# Patient Record
Sex: Female | Born: 1950 | Race: Black or African American | Hispanic: No | Smoking: Current every day smoker
Health system: Southern US, Community
[De-identification: ages and names within clinical notes are randomized; demographics above are authoritative.]

## PROBLEM LIST (undated history)

## (undated) DIAGNOSIS — H81319 Aural vertigo, unspecified ear: Secondary | ICD-10-CM

## (undated) DIAGNOSIS — I2510 Atherosclerotic heart disease of native coronary artery without angina pectoris: Secondary | ICD-10-CM

## (undated) DIAGNOSIS — G8929 Other chronic pain: Secondary | ICD-10-CM

## (undated) DIAGNOSIS — M545 Low back pain, unspecified: Secondary | ICD-10-CM

## (undated) DIAGNOSIS — E669 Obesity, unspecified: Secondary | ICD-10-CM

## (undated) DIAGNOSIS — J45909 Unspecified asthma, uncomplicated: Secondary | ICD-10-CM

## (undated) DIAGNOSIS — F32A Depression, unspecified: Secondary | ICD-10-CM

## (undated) DIAGNOSIS — I639 Cerebral infarction, unspecified: Secondary | ICD-10-CM

## (undated) DIAGNOSIS — Z8679 Personal history of other diseases of the circulatory system: Secondary | ICD-10-CM

## (undated) DIAGNOSIS — Z86718 Personal history of other venous thrombosis and embolism: Secondary | ICD-10-CM

## (undated) DIAGNOSIS — F329 Major depressive disorder, single episode, unspecified: Secondary | ICD-10-CM

## (undated) DIAGNOSIS — E119 Type 2 diabetes mellitus without complications: Secondary | ICD-10-CM

## (undated) DIAGNOSIS — E7800 Pure hypercholesterolemia, unspecified: Secondary | ICD-10-CM

## (undated) DIAGNOSIS — I219 Acute myocardial infarction, unspecified: Secondary | ICD-10-CM

## (undated) DIAGNOSIS — I10 Essential (primary) hypertension: Secondary | ICD-10-CM

## (undated) DIAGNOSIS — N289 Disorder of kidney and ureter, unspecified: Secondary | ICD-10-CM

## (undated) DIAGNOSIS — M75102 Unspecified rotator cuff tear or rupture of left shoulder, not specified as traumatic: Secondary | ICD-10-CM

## (undated) DIAGNOSIS — J449 Chronic obstructive pulmonary disease, unspecified: Secondary | ICD-10-CM

## (undated) HISTORY — DX: Type 2 diabetes mellitus without complications: E11.9

## (undated) HISTORY — DX: Depression, unspecified: F32.A

## (undated) HISTORY — DX: Personal history of other diseases of the circulatory system: Z86.79

## (undated) HISTORY — DX: Obesity, unspecified: E66.9

## (undated) HISTORY — PX: JOINT REPLACEMENT: SHX530

## (undated) HISTORY — DX: Atherosclerotic heart disease of native coronary artery without angina pectoris: I25.10

## (undated) HISTORY — DX: Essential (primary) hypertension: I10

## (undated) HISTORY — DX: Major depressive disorder, single episode, unspecified: F32.9

## (undated) HISTORY — DX: Cerebral infarction, unspecified: I63.9

## (undated) HISTORY — PX: CARDIAC CATHETERIZATION: SHX172

## (undated) HISTORY — PX: CORONARY STENT PLACEMENT: SHX1402

## (undated) HISTORY — DX: Acute myocardial infarction, unspecified: I21.9

## (undated) HISTORY — DX: Pure hypercholesterolemia, unspecified: E78.00

## (undated) HISTORY — DX: Chronic obstructive pulmonary disease, unspecified: J44.9

## (undated) HISTORY — PX: REPLACEMENT TOTAL KNEE BILATERAL: SUR1225

## (undated) HISTORY — PX: KNEE SURGERY: SHX244

## (undated) HISTORY — DX: Aural vertigo, unspecified ear: H81.319

## (undated) HISTORY — DX: Personal history of other venous thrombosis and embolism: Z86.718

---

## 2004-07-27 ENCOUNTER — Emergency Department: Payer: Self-pay | Admitting: Emergency Medicine

## 2005-03-21 ENCOUNTER — Ambulatory Visit: Payer: Self-pay

## 2005-08-23 ENCOUNTER — Other Ambulatory Visit: Payer: Self-pay

## 2005-09-01 ENCOUNTER — Inpatient Hospital Stay: Payer: Self-pay | Admitting: Orthopaedic Surgery

## 2005-09-19 ENCOUNTER — Emergency Department: Payer: Self-pay | Admitting: Emergency Medicine

## 2006-03-13 ENCOUNTER — Emergency Department: Payer: Self-pay | Admitting: Emergency Medicine

## 2006-05-17 ENCOUNTER — Ambulatory Visit: Payer: Self-pay | Admitting: Unknown Physician Specialty

## 2006-11-04 ENCOUNTER — Emergency Department: Payer: Self-pay | Admitting: General Practice

## 2007-01-22 ENCOUNTER — Ambulatory Visit: Payer: Self-pay | Admitting: Internal Medicine

## 2007-03-19 ENCOUNTER — Emergency Department: Payer: Self-pay | Admitting: Emergency Medicine

## 2007-05-16 ENCOUNTER — Emergency Department: Payer: Self-pay | Admitting: Emergency Medicine

## 2007-05-20 ENCOUNTER — Emergency Department: Payer: Self-pay | Admitting: Emergency Medicine

## 2008-02-07 ENCOUNTER — Emergency Department: Payer: Self-pay | Admitting: Emergency Medicine

## 2008-05-06 ENCOUNTER — Other Ambulatory Visit: Payer: Self-pay

## 2008-05-06 ENCOUNTER — Emergency Department: Payer: Self-pay | Admitting: Emergency Medicine

## 2008-06-22 ENCOUNTER — Emergency Department: Payer: Self-pay | Admitting: Emergency Medicine

## 2008-08-19 ENCOUNTER — Emergency Department: Payer: Self-pay | Admitting: Emergency Medicine

## 2009-03-07 ENCOUNTER — Emergency Department: Payer: Self-pay | Admitting: Emergency Medicine

## 2009-03-22 ENCOUNTER — Emergency Department: Payer: Self-pay | Admitting: Emergency Medicine

## 2009-08-10 ENCOUNTER — Emergency Department: Payer: Self-pay | Admitting: Unknown Physician Specialty

## 2010-05-19 ENCOUNTER — Ambulatory Visit: Payer: Self-pay | Admitting: Unknown Physician Specialty

## 2010-09-11 ENCOUNTER — Ambulatory Visit: Payer: Self-pay | Admitting: Unknown Physician Specialty

## 2010-09-13 ENCOUNTER — Inpatient Hospital Stay: Payer: Self-pay | Admitting: Internal Medicine

## 2010-09-19 HISTORY — PX: CORONARY ARTERY BYPASS GRAFT: SHX141

## 2010-10-26 ENCOUNTER — Ambulatory Visit: Payer: Self-pay | Admitting: Unknown Physician Specialty

## 2011-02-07 ENCOUNTER — Emergency Department: Payer: Self-pay | Admitting: Emergency Medicine

## 2011-03-26 DIAGNOSIS — E119 Type 2 diabetes mellitus without complications: Secondary | ICD-10-CM | POA: Insufficient documentation

## 2011-03-26 DIAGNOSIS — I2510 Atherosclerotic heart disease of native coronary artery without angina pectoris: Secondary | ICD-10-CM | POA: Insufficient documentation

## 2011-03-26 DIAGNOSIS — N185 Chronic kidney disease, stage 5: Secondary | ICD-10-CM | POA: Insufficient documentation

## 2011-03-26 DIAGNOSIS — E669 Obesity, unspecified: Secondary | ICD-10-CM | POA: Insufficient documentation

## 2011-05-05 DIAGNOSIS — F329 Major depressive disorder, single episode, unspecified: Secondary | ICD-10-CM | POA: Insufficient documentation

## 2011-05-05 DIAGNOSIS — F32A Depression, unspecified: Secondary | ICD-10-CM | POA: Insufficient documentation

## 2011-06-14 DIAGNOSIS — I739 Peripheral vascular disease, unspecified: Secondary | ICD-10-CM | POA: Insufficient documentation

## 2011-06-14 DIAGNOSIS — E782 Mixed hyperlipidemia: Secondary | ICD-10-CM | POA: Insufficient documentation

## 2011-06-14 DIAGNOSIS — Z9989 Dependence on other enabling machines and devices: Secondary | ICD-10-CM | POA: Insufficient documentation

## 2011-06-14 DIAGNOSIS — G4733 Obstructive sleep apnea (adult) (pediatric): Secondary | ICD-10-CM | POA: Insufficient documentation

## 2011-08-12 ENCOUNTER — Inpatient Hospital Stay: Payer: Self-pay | Admitting: Internal Medicine

## 2011-09-26 ENCOUNTER — Encounter: Payer: Self-pay | Admitting: Internal Medicine

## 2011-10-21 ENCOUNTER — Encounter: Payer: Self-pay | Admitting: Internal Medicine

## 2011-11-10 ENCOUNTER — Ambulatory Visit: Payer: Self-pay | Admitting: Unknown Physician Specialty

## 2011-11-18 ENCOUNTER — Encounter: Payer: Self-pay | Admitting: Internal Medicine

## 2012-01-31 ENCOUNTER — Emergency Department: Payer: Self-pay | Admitting: Emergency Medicine

## 2012-01-31 LAB — URINALYSIS, COMPLETE
Specific Gravity: 1.01 (ref 1.003–1.030)
Squamous Epithelial: <1
WBC UR: 14 /HPF (ref 0–5)

## 2012-01-31 LAB — COMPREHENSIVE METABOLIC PANEL
Anion Gap: 11 (ref 7–16)
BUN: 40 mg/dL — ABNORMAL HIGH (ref 7–18)
Calcium, Total: 8.8 mg/dL (ref 8.5–10.1)
Chloride: 111 mmol/L — ABNORMAL HIGH (ref 98–107)
Co2: 19 mmol/L — ABNORMAL LOW (ref 21–32)
EGFR (African American): 23 — ABNORMAL LOW
EGFR (Non-African Amer.): 20 — ABNORMAL LOW
SGOT(AST): 15 U/L (ref 15–37)
Sodium: 141 mmol/L (ref 136–145)
Total Protein: 7.8 g/dL (ref 6.4–8.2)

## 2012-01-31 LAB — CBC
HCT: 33.9 % — ABNORMAL LOW (ref 35.0–47.0)
MCH: 27.7 pg (ref 26.0–34.0)
MCHC: 32.1 g/dL (ref 32.0–36.0)
RBC: 3.93 10*6/uL (ref 3.80–5.20)
RDW: 15.8 % — ABNORMAL HIGH (ref 11.5–14.5)
WBC: 8.7 10*3/uL (ref 3.6–11.0)

## 2012-01-31 LAB — TROPONIN I: Troponin-I: <0.02 ng/mL

## 2012-04-18 ENCOUNTER — Ambulatory Visit: Payer: Self-pay | Admitting: Internal Medicine

## 2012-05-14 ENCOUNTER — Emergency Department: Payer: Self-pay | Admitting: *Deleted

## 2012-05-15 LAB — BASIC METABOLIC PANEL
BUN: 45 mg/dL — ABNORMAL HIGH (ref 7–18)
Chloride: 109 mmol/L — ABNORMAL HIGH (ref 98–107)
Co2: 23 mmol/L (ref 21–32)
Creatinine: 2.51 mg/dL — ABNORMAL HIGH (ref 0.60–1.30)
EGFR (Non-African Amer.): 20 — ABNORMAL LOW
Sodium: 139 mmol/L (ref 136–145)

## 2012-05-15 LAB — URINALYSIS, COMPLETE
Bacteria: NONE SEEN
Bilirubin,UR: NEGATIVE
Nitrite: NEGATIVE
Protein: 100
RBC,UR: 1 /HPF (ref 0–5)
Specific Gravity: 1.013 (ref 1.003–1.030)
Squamous Epithelial: <1
WBC UR: 12 /HPF (ref 0–5)

## 2012-05-15 LAB — CBC
HCT: 37.4 % (ref 35.0–47.0)
HGB: 12 g/dL (ref 12.0–16.0)
MCH: 27.6 pg (ref 26.0–34.0)
MCV: 86 fL (ref 80–100)
Platelet: 223 10*3/uL (ref 150–440)
RBC: 4.36 10*6/uL (ref 3.80–5.20)

## 2012-05-15 LAB — CK TOTAL AND CKMB (NOT AT ARMC): CK, Total: 131 U/L (ref 21–215)

## 2012-05-30 ENCOUNTER — Ambulatory Visit: Payer: Self-pay | Admitting: Gastroenterology

## 2012-06-01 LAB — PATHOLOGY REPORT

## 2012-06-26 ENCOUNTER — Ambulatory Visit: Payer: Self-pay | Admitting: Pain Medicine

## 2012-07-09 ENCOUNTER — Ambulatory Visit: Payer: Self-pay | Admitting: Pain Medicine

## 2012-08-01 ENCOUNTER — Ambulatory Visit: Payer: Self-pay | Admitting: Gastroenterology

## 2012-08-07 ENCOUNTER — Ambulatory Visit: Payer: Self-pay | Admitting: Pain Medicine

## 2012-09-06 ENCOUNTER — Ambulatory Visit: Payer: Self-pay | Admitting: Pain Medicine

## 2012-09-21 ENCOUNTER — Ambulatory Visit: Payer: Self-pay | Admitting: Pain Medicine

## 2012-10-04 ENCOUNTER — Ambulatory Visit: Payer: Self-pay | Admitting: Pain Medicine

## 2012-10-31 ENCOUNTER — Ambulatory Visit: Payer: Self-pay | Admitting: Pain Medicine

## 2012-11-05 ENCOUNTER — Emergency Department: Payer: Self-pay | Admitting: Emergency Medicine

## 2012-11-06 LAB — BASIC METABOLIC PANEL
BUN: 46 mg/dL — ABNORMAL HIGH (ref 7–18)
Co2: 19 mmol/L — ABNORMAL LOW (ref 21–32)
EGFR (Non-African Amer.): 15 — ABNORMAL LOW
Osmolality: 294 (ref 275–301)
Sodium: 139 mmol/L (ref 136–145)

## 2012-11-06 LAB — TROPONIN I: Troponin-I: <0.02 ng/mL

## 2012-11-06 LAB — CBC
HGB: 10.3 g/dL — ABNORMAL LOW (ref 12.0–16.0)
MCH: 27.3 pg (ref 26.0–34.0)
MCHC: 31.6 g/dL — ABNORMAL LOW (ref 32.0–36.0)
RBC: 3.77 10*6/uL — ABNORMAL LOW (ref 3.80–5.20)
WBC: 8.4 10*3/uL (ref 3.6–11.0)

## 2012-11-07 ENCOUNTER — Ambulatory Visit: Payer: Self-pay | Admitting: Pain Medicine

## 2012-11-15 ENCOUNTER — Observation Stay: Payer: Self-pay | Admitting: Surgery

## 2012-11-15 LAB — COMPREHENSIVE METABOLIC PANEL
Albumin: 3.2 g/dL — ABNORMAL LOW (ref 3.4–5.0)
BUN: 36 mg/dL — ABNORMAL HIGH (ref 7–18)
Calcium, Total: 8.6 mg/dL (ref 8.5–10.1)
Chloride: 112 mmol/L — ABNORMAL HIGH (ref 98–107)
Co2: 23 mmol/L (ref 21–32)
Creatinine: 2.8 mg/dL — ABNORMAL HIGH (ref 0.60–1.30)
EGFR (African American): 20 — ABNORMAL LOW
EGFR (Non-African Amer.): 17 — ABNORMAL LOW
Potassium: 4.2 mmol/L (ref 3.5–5.1)
SGOT(AST): 18 U/L (ref 15–37)
SGPT (ALT): 17 U/L (ref 12–78)

## 2012-11-15 LAB — CBC WITH DIFFERENTIAL/PLATELET
Basophil #: 0.1 10*3/uL (ref 0.0–0.1)
Basophil %: 0.7 %
Eosinophil %: 2.2 %
HCT: 34.7 % — ABNORMAL LOW (ref 35.0–47.0)
HGB: 11.1 g/dL — ABNORMAL LOW (ref 12.0–16.0)
Lymphocyte #: 2.5 10*3/uL (ref 1.0–3.6)
Lymphocyte %: 29.9 %
MCH: 27.7 pg (ref 26.0–34.0)
MCV: 87 fL (ref 80–100)
Neutrophil #: 5.1 10*3/uL (ref 1.4–6.5)
Neutrophil %: 60.8 %
Platelet: 241 10*3/uL (ref 150–440)
WBC: 8.4 10*3/uL (ref 3.6–11.0)

## 2012-11-15 LAB — LIPASE, BLOOD: Lipase: 113 U/L (ref 73–393)

## 2012-11-16 LAB — BASIC METABOLIC PANEL
Anion Gap: 7 (ref 7–16)
BUN: 41 mg/dL — ABNORMAL HIGH (ref 7–18)
Co2: 21 mmol/L (ref 21–32)
Creatinine: 2.86 mg/dL — ABNORMAL HIGH (ref 0.60–1.30)
EGFR (African American): 20 — ABNORMAL LOW
EGFR (Non-African Amer.): 17 — ABNORMAL LOW
Glucose: 118 mg/dL — ABNORMAL HIGH (ref 65–99)
Potassium: 4.5 mmol/L (ref 3.5–5.1)

## 2012-11-16 LAB — CBC WITH DIFFERENTIAL/PLATELET
Basophil %: 0.3 %
Eosinophil %: 1.9 %
HCT: 32.7 % — ABNORMAL LOW (ref 35.0–47.0)
Lymphocyte #: 2.6 10*3/uL (ref 1.0–3.6)
Lymphocyte %: 30.3 %
MCH: 27.8 pg (ref 26.0–34.0)
MCV: 87 fL (ref 80–100)
Monocyte #: 0.8 x10 3/mm (ref 0.2–0.9)
Monocyte %: 9.1 %
Neutrophil #: 5 10*3/uL (ref 1.4–6.5)
Neutrophil %: 58.4 %
RBC: 3.75 10*6/uL — ABNORMAL LOW (ref 3.80–5.20)

## 2012-11-16 LAB — URINALYSIS, COMPLETE
Bilirubin,UR: NEGATIVE
Blood: NEGATIVE
Leukocyte Esterase: NEGATIVE
Specific Gravity: 1.015 (ref 1.003–1.030)
Squamous Epithelial: 2
WBC UR: 1 /HPF (ref 0–5)

## 2012-11-21 ENCOUNTER — Emergency Department: Payer: Self-pay

## 2012-12-06 ENCOUNTER — Ambulatory Visit: Payer: Self-pay | Admitting: Pain Medicine

## 2013-01-07 ENCOUNTER — Ambulatory Visit: Payer: Self-pay | Admitting: Pain Medicine

## 2013-02-05 ENCOUNTER — Ambulatory Visit: Payer: Self-pay | Admitting: Pain Medicine

## 2013-02-18 ENCOUNTER — Emergency Department: Payer: Self-pay | Admitting: Emergency Medicine

## 2013-02-18 LAB — CBC
HCT: 31.8 % — ABNORMAL LOW (ref 35.0–47.0)
MCH: 27.4 pg (ref 26.0–34.0)
MCHC: 32.6 g/dL (ref 32.0–36.0)
Platelet: 213 10*3/uL (ref 150–440)
RBC: 3.78 10*6/uL — ABNORMAL LOW (ref 3.80–5.20)
RDW: 16.1 % — ABNORMAL HIGH (ref 11.5–14.5)

## 2013-02-18 LAB — BASIC METABOLIC PANEL
Calcium, Total: 8.7 mg/dL (ref 8.5–10.1)
Chloride: 111 mmol/L — ABNORMAL HIGH (ref 98–107)
EGFR (African American): 15 — ABNORMAL LOW
EGFR (Non-African Amer.): 13 — ABNORMAL LOW
Glucose: 92 mg/dL (ref 65–99)
Osmolality: 289 (ref 275–301)
Sodium: 139 mmol/L (ref 136–145)

## 2013-02-18 LAB — URINALYSIS, COMPLETE
Bilirubin,UR: NEGATIVE
Glucose,UR: NEGATIVE mg/dL (ref 0–75)
Nitrite: NEGATIVE
Ph: 6 (ref 4.5–8.0)
WBC UR: 2 /HPF (ref 0–5)

## 2013-02-18 LAB — TROPONIN I: Troponin-I: <0.02 ng/mL

## 2013-03-07 ENCOUNTER — Ambulatory Visit: Payer: Self-pay | Admitting: Pain Medicine

## 2013-04-04 ENCOUNTER — Ambulatory Visit: Payer: Self-pay | Admitting: Pain Medicine

## 2013-04-23 ENCOUNTER — Emergency Department: Payer: Self-pay | Admitting: Emergency Medicine

## 2013-05-01 ENCOUNTER — Ambulatory Visit: Payer: Self-pay | Admitting: Pain Medicine

## 2013-05-15 ENCOUNTER — Emergency Department: Payer: Self-pay | Admitting: Emergency Medicine

## 2013-05-15 LAB — URINALYSIS, COMPLETE
Bacteria: NONE SEEN
Glucose,UR: 50 mg/dL (ref 0–75)
Leukocyte Esterase: NEGATIVE
Ph: 6 (ref 4.5–8.0)
RBC,UR: <1 /HPF (ref 0–5)
Specific Gravity: 1.013 (ref 1.003–1.030)
Squamous Epithelial: NONE SEEN
WBC UR: 1 /HPF (ref 0–5)

## 2013-05-15 LAB — COMPREHENSIVE METABOLIC PANEL
Alkaline Phosphatase: 125 U/L (ref 50–136)
Anion Gap: 7 (ref 7–16)
BUN: 52 mg/dL — ABNORMAL HIGH (ref 7–18)
Calcium, Total: 9.6 mg/dL (ref 8.5–10.1)
Creatinine: 3.47 mg/dL — ABNORMAL HIGH (ref 0.60–1.30)
EGFR (Non-African Amer.): 13 — ABNORMAL LOW
Osmolality: 289 (ref 275–301)
Potassium: 4.1 mmol/L (ref 3.5–5.1)
SGOT(AST): 16 U/L (ref 15–37)
SGPT (ALT): 18 U/L (ref 12–78)

## 2013-05-15 LAB — CBC
MCH: 28 pg (ref 26.0–34.0)
Platelet: 224 10*3/uL (ref 150–440)
RBC: 4.03 10*6/uL (ref 3.80–5.20)
RDW: 17.2 % — ABNORMAL HIGH (ref 11.5–14.5)
WBC: 9.2 10*3/uL (ref 3.6–11.0)

## 2013-05-15 LAB — TROPONIN I: Troponin-I: <0.02 ng/mL

## 2013-05-17 DIAGNOSIS — Z720 Tobacco use: Secondary | ICD-10-CM | POA: Insufficient documentation

## 2013-05-30 ENCOUNTER — Ambulatory Visit: Payer: Self-pay | Admitting: Pain Medicine

## 2013-06-04 LAB — COMPREHENSIVE METABOLIC PANEL
Albumin: 3.1 g/dL — ABNORMAL LOW (ref 3.4–5.0)
BUN: 54 mg/dL — ABNORMAL HIGH (ref 7–18)
Bilirubin,Total: 0.3 mg/dL (ref 0.2–1.0)
Co2: 24 mmol/L (ref 21–32)
Creatinine: 4.05 mg/dL — ABNORMAL HIGH (ref 0.60–1.30)
EGFR (African American): 13 — ABNORMAL LOW
EGFR (Non-African Amer.): 11 — ABNORMAL LOW
Glucose: 148 mg/dL — ABNORMAL HIGH (ref 65–99)
Osmolality: 301 (ref 275–301)
Potassium: 3.8 mmol/L (ref 3.5–5.1)
SGPT (ALT): 18 U/L (ref 12–78)
Sodium: 142 mmol/L (ref 136–145)
Total Protein: 7.9 g/dL (ref 6.4–8.2)

## 2013-06-04 LAB — CBC
MCH: 27.9 pg (ref 26.0–34.0)
MCV: 85 fL (ref 80–100)
Platelet: 228 10*3/uL (ref 150–440)
RDW: 16.7 % — ABNORMAL HIGH (ref 11.5–14.5)
WBC: 8.4 10*3/uL (ref 3.6–11.0)

## 2013-06-04 LAB — TROPONIN I: Troponin-I: 0.06 ng/mL — ABNORMAL HIGH

## 2013-06-04 LAB — CK TOTAL AND CKMB (NOT AT ARMC): CK, Total: 141 U/L (ref 21–215)

## 2013-06-05 ENCOUNTER — Inpatient Hospital Stay: Payer: Self-pay | Admitting: Internal Medicine

## 2013-06-05 LAB — CK-MB: CK-MB: 1.3 ng/mL (ref 0.5–3.6)

## 2013-06-05 LAB — URINALYSIS, COMPLETE
Glucose,UR: 50 mg/dL (ref 0–75)
Leukocyte Esterase: NEGATIVE
Nitrite: NEGATIVE
Squamous Epithelial: 4
WBC UR: 2 /HPF (ref 0–5)

## 2013-06-05 LAB — TROPONIN I: Troponin-I: 0.33 ng/mL — ABNORMAL HIGH

## 2013-06-05 LAB — APTT: Activated PTT: 110.3 secs — ABNORMAL HIGH (ref 23.6–35.9)

## 2013-06-06 LAB — BASIC METABOLIC PANEL
Anion Gap: 7 (ref 7–16)
BUN: 46 mg/dL — ABNORMAL HIGH (ref 7–18)
Calcium, Total: 9.4 mg/dL (ref 8.5–10.1)
Creatinine: 3.15 mg/dL — ABNORMAL HIGH (ref 0.60–1.30)
Glucose: 126 mg/dL — ABNORMAL HIGH (ref 65–99)
Sodium: 137 mmol/L (ref 136–145)

## 2013-06-06 LAB — CBC WITH DIFFERENTIAL/PLATELET
Basophil #: 0.1 10*3/uL (ref 0.0–0.1)
Basophil %: 1 %
HCT: 29.5 % — ABNORMAL LOW (ref 35.0–47.0)
HGB: 9.6 g/dL — ABNORMAL LOW (ref 12.0–16.0)
Lymphocyte #: 2.4 10*3/uL (ref 1.0–3.6)
MCHC: 32.5 g/dL (ref 32.0–36.0)
Monocyte #: 0.8 x10 3/mm (ref 0.2–0.9)
Neutrophil #: 6 10*3/uL (ref 1.4–6.5)
Neutrophil %: 63.6 %
Platelet: 230 10*3/uL (ref 150–440)
WBC: 9.4 10*3/uL (ref 3.6–11.0)

## 2013-06-06 LAB — PHOSPHORUS: Phosphorus: 3.7 mg/dL (ref 2.5–4.9)

## 2013-06-07 LAB — BASIC METABOLIC PANEL
Calcium, Total: 9 mg/dL (ref 8.5–10.1)
Chloride: 109 mmol/L — ABNORMAL HIGH (ref 98–107)
Co2: 20 mmol/L — ABNORMAL LOW (ref 21–32)
Creatinine: 3.15 mg/dL — ABNORMAL HIGH (ref 0.60–1.30)
EGFR (Non-African Amer.): 15 — ABNORMAL LOW
Osmolality: 279 (ref 275–301)

## 2013-06-07 LAB — LIPID PANEL
Cholesterol: 164 mg/dL (ref 0–200)
HDL Cholesterol: 41 mg/dL (ref 40–60)
Ldl Cholesterol, Calc: 95 mg/dL (ref 0–100)
Triglycerides: 139 mg/dL (ref 0–200)
VLDL Cholesterol, Calc: 28 mg/dL (ref 5–40)

## 2013-06-07 LAB — APTT
Activated PTT: 126 secs — ABNORMAL HIGH (ref 23.6–35.9)
Activated PTT: 137 secs — ABNORMAL HIGH (ref 23.6–35.9)

## 2013-06-08 LAB — HEMOGLOBIN: HGB: 9.6 g/dL — ABNORMAL LOW (ref 12.0–16.0)

## 2013-06-08 LAB — BASIC METABOLIC PANEL
Anion Gap: 8 (ref 7–16)
BUN: 35 mg/dL — ABNORMAL HIGH (ref 7–18)
Co2: 19 mmol/L — ABNORMAL LOW (ref 21–32)
Creatinine: 3.26 mg/dL — ABNORMAL HIGH (ref 0.60–1.30)
EGFR (African American): 17 — ABNORMAL LOW
EGFR (Non-African Amer.): 14 — ABNORMAL LOW
Osmolality: 282 (ref 275–301)
Potassium: 4.2 mmol/L (ref 3.5–5.1)
Sodium: 137 mmol/L (ref 136–145)

## 2013-06-08 LAB — APTT: Activated PTT: 108.2 secs — ABNORMAL HIGH (ref 23.6–35.9)

## 2013-06-09 LAB — APTT
Activated PTT: 130.5 secs — ABNORMAL HIGH (ref 23.6–35.9)
Activated PTT: 91.1 secs — ABNORMAL HIGH (ref 23.6–35.9)

## 2013-06-10 LAB — BASIC METABOLIC PANEL
Anion Gap: 11 (ref 7–16)
Chloride: 110 mmol/L — ABNORMAL HIGH (ref 98–107)
Creatinine: 3.3 mg/dL — ABNORMAL HIGH (ref 0.60–1.30)
EGFR (African American): 17 — ABNORMAL LOW
Glucose: 106 mg/dL — ABNORMAL HIGH (ref 65–99)
Osmolality: 285 (ref 275–301)
Potassium: 4.6 mmol/L (ref 3.5–5.1)
Sodium: 139 mmol/L (ref 136–145)

## 2013-06-10 LAB — CBC
HGB: 9.5 g/dL — ABNORMAL LOW (ref 12.0–16.0)
MCHC: 32.8 g/dL (ref 32.0–36.0)

## 2013-06-10 LAB — PROTIME-INR: Prothrombin Time: 12.6 secs (ref 11.5–14.7)

## 2013-06-25 LAB — BASIC METABOLIC PANEL
Calcium, Total: 9.4 mg/dL (ref 8.5–10.1)
Calcium, Total: 9.7 mg/dL (ref 8.5–10.1)
Chloride: 112 mmol/L — ABNORMAL HIGH (ref 98–107)
Co2: 17 mmol/L — ABNORMAL LOW (ref 21–32)
Creatinine: 5.24 mg/dL — ABNORMAL HIGH (ref 0.60–1.30)
Creatinine: 5.33 mg/dL — ABNORMAL HIGH (ref 0.60–1.30)
EGFR (African American): 9 — ABNORMAL LOW
EGFR (African American): 9 — ABNORMAL LOW
EGFR (Non-African Amer.): 8 — ABNORMAL LOW
Glucose: 116 mg/dL — ABNORMAL HIGH (ref 65–99)
Glucose: 98 mg/dL (ref 65–99)
Osmolality: 293 (ref 275–301)
Sodium: 137 mmol/L (ref 136–145)
Sodium: 137 mmol/L (ref 136–145)

## 2013-06-25 LAB — CBC
MCH: 27.8 pg (ref 26.0–34.0)
MCV: 87 fL (ref 80–100)
WBC: 11 10*3/uL (ref 3.6–11.0)

## 2013-06-25 LAB — URINALYSIS, COMPLETE
Glucose,UR: NEGATIVE mg/dL (ref 0–75)
Leukocyte Esterase: NEGATIVE
Nitrite: NEGATIVE
WBC UR: 2 /HPF (ref 0–5)

## 2013-06-26 ENCOUNTER — Inpatient Hospital Stay: Payer: Self-pay | Admitting: Internal Medicine

## 2013-06-27 LAB — BASIC METABOLIC PANEL
Anion Gap: 6 — ABNORMAL LOW (ref 7–16)
BUN: 52 mg/dL — ABNORMAL HIGH (ref 7–18)
Creatinine: 4.31 mg/dL — ABNORMAL HIGH (ref 0.60–1.30)
EGFR (African American): 12 — ABNORMAL LOW
Glucose: 103 mg/dL — ABNORMAL HIGH (ref 65–99)
Potassium: 4.9 mmol/L (ref 3.5–5.1)

## 2013-07-02 ENCOUNTER — Ambulatory Visit: Payer: Self-pay | Admitting: Pain Medicine

## 2013-07-20 LAB — BASIC METABOLIC PANEL
Anion Gap: 7 (ref 7–16)
BUN: 63 mg/dL — ABNORMAL HIGH (ref 7–18)
Calcium, Total: 9.3 mg/dL (ref 8.5–10.1)
Co2: 18 mmol/L — ABNORMAL LOW (ref 21–32)
EGFR (African American): 11 — ABNORMAL LOW
Osmolality: 294 (ref 275–301)
Potassium: 4.1 mmol/L (ref 3.5–5.1)

## 2013-07-20 LAB — CBC
HCT: 28.5 % — ABNORMAL LOW (ref 35.0–47.0)
HGB: 9.7 g/dL — ABNORMAL LOW (ref 12.0–16.0)
MCH: 29 pg (ref 26.0–34.0)
MCHC: 33.9 g/dL (ref 32.0–36.0)
RBC: 3.34 10*6/uL — ABNORMAL LOW (ref 3.80–5.20)

## 2013-07-20 LAB — COMPREHENSIVE METABOLIC PANEL
Alkaline Phosphatase: 127 U/L (ref 50–136)
Bilirubin,Total: 0.5 mg/dL (ref 0.2–1.0)
Calcium, Total: 9.2 mg/dL (ref 8.5–10.1)
Chloride: 111 mmol/L — ABNORMAL HIGH (ref 98–107)
Creatinine: 4.45 mg/dL — ABNORMAL HIGH (ref 0.60–1.30)
EGFR (African American): 12 — ABNORMAL LOW
Glucose: 129 mg/dL — ABNORMAL HIGH (ref 65–99)
Osmolality: 291 (ref 275–301)
Potassium: 5.6 mmol/L — ABNORMAL HIGH (ref 3.5–5.1)
SGOT(AST): 39 U/L — ABNORMAL HIGH (ref 15–37)
SGPT (ALT): 22 U/L (ref 12–78)
Total Protein: 8.9 g/dL — ABNORMAL HIGH (ref 6.4–8.2)

## 2013-07-21 ENCOUNTER — Inpatient Hospital Stay: Payer: Self-pay | Admitting: Internal Medicine

## 2013-07-21 LAB — URINALYSIS, COMPLETE
Blood: NEGATIVE
Glucose,UR: NEGATIVE mg/dL (ref 0–75)
Ketone: NEGATIVE
Leukocyte Esterase: NEGATIVE
Nitrite: NEGATIVE
Ph: 5 (ref 4.5–8.0)
Protein: 100
RBC,UR: NONE SEEN /HPF (ref 0–5)
Specific Gravity: 1.013 (ref 1.003–1.030)
WBC UR: 1 /HPF (ref 0–5)

## 2013-07-22 LAB — CBC WITH DIFFERENTIAL/PLATELET
Basophil #: 0 10*3/uL (ref 0.0–0.1)
Eosinophil #: 0.3 10*3/uL (ref 0.0–0.7)
Eosinophil %: 4.1 %
HGB: 8.9 g/dL — ABNORMAL LOW (ref 12.0–16.0)
Lymphocyte %: 37.7 %
MCH: 28.5 pg (ref 26.0–34.0)
MCV: 86 fL (ref 80–100)
Monocyte #: 0.6 x10 3/mm (ref 0.2–0.9)
Monocyte %: 10 %
Neutrophil %: 47.4 %
Platelet: 212 10*3/uL (ref 150–440)
RDW: 15.4 % — ABNORMAL HIGH (ref 11.5–14.5)
WBC: 6.1 10*3/uL (ref 3.6–11.0)

## 2013-07-22 LAB — BASIC METABOLIC PANEL
Anion Gap: 5 — ABNORMAL LOW (ref 7–16)
BUN: 60 mg/dL — ABNORMAL HIGH (ref 7–18)
Chloride: 110 mmol/L — ABNORMAL HIGH (ref 98–107)
Potassium: 4 mmol/L (ref 3.5–5.1)
Sodium: 135 mmol/L — ABNORMAL LOW (ref 136–145)

## 2013-07-30 ENCOUNTER — Ambulatory Visit: Payer: Self-pay | Admitting: Pain Medicine

## 2013-08-22 ENCOUNTER — Emergency Department: Payer: Self-pay | Admitting: Emergency Medicine

## 2013-08-22 LAB — BASIC METABOLIC PANEL
Anion Gap: 10 (ref 7–16)
Chloride: 109 mmol/L — ABNORMAL HIGH (ref 98–107)
Co2: 18 mmol/L — ABNORMAL LOW (ref 21–32)
EGFR (Non-African Amer.): 12 — ABNORMAL LOW
Osmolality: 296 (ref 275–301)
Potassium: 4.2 mmol/L (ref 3.5–5.1)
Sodium: 137 mmol/L (ref 136–145)

## 2013-08-22 LAB — CBC
HCT: 25.6 % — ABNORMAL LOW (ref 35.0–47.0)
HGB: 8.3 g/dL — ABNORMAL LOW (ref 12.0–16.0)
MCHC: 32.6 g/dL (ref 32.0–36.0)
MCV: 83 fL (ref 80–100)
Platelet: 221 10*3/uL (ref 150–440)
RDW: 15.6 % — ABNORMAL HIGH (ref 11.5–14.5)
WBC: 9.9 10*3/uL (ref 3.6–11.0)

## 2013-08-23 LAB — TROPONIN I: Troponin-I: <0.02 ng/mL

## 2013-08-23 LAB — CK: CK, Total: 100 U/L (ref 21–215)

## 2013-08-28 DIAGNOSIS — R911 Solitary pulmonary nodule: Secondary | ICD-10-CM | POA: Insufficient documentation

## 2013-08-29 ENCOUNTER — Ambulatory Visit: Payer: Self-pay | Admitting: Pain Medicine

## 2013-09-28 ENCOUNTER — Emergency Department: Payer: Self-pay | Admitting: Emergency Medicine

## 2013-09-28 LAB — CBC
HCT: 25 % — AB (ref 35.0–47.0)
HGB: 8.1 g/dL — AB (ref 12.0–16.0)
MCH: 26.1 pg (ref 26.0–34.0)
MCHC: 32.4 g/dL (ref 32.0–36.0)
MCV: 80 fL (ref 80–100)
Platelet: 231 10*3/uL (ref 150–440)
RBC: 3.1 10*6/uL — AB (ref 3.80–5.20)
RDW: 16.1 % — ABNORMAL HIGH (ref 11.5–14.5)
WBC: 9.5 10*3/uL (ref 3.6–11.0)

## 2013-09-28 LAB — BASIC METABOLIC PANEL
ANION GAP: 6 — AB (ref 7–16)
BUN: 44 mg/dL — ABNORMAL HIGH (ref 7–18)
CALCIUM: 8.8 mg/dL (ref 8.5–10.1)
Chloride: 106 mmol/L (ref 98–107)
Co2: 21 mmol/L (ref 21–32)
Creatinine: 3.36 mg/dL — ABNORMAL HIGH (ref 0.60–1.30)
EGFR (African American): 16 — ABNORMAL LOW
EGFR (Non-African Amer.): 14 — ABNORMAL LOW
GLUCOSE: 133 mg/dL — AB (ref 65–99)
Osmolality: 279 (ref 275–301)
Potassium: 3.9 mmol/L (ref 3.5–5.1)
Sodium: 133 mmol/L — ABNORMAL LOW (ref 136–145)

## 2013-09-28 LAB — TROPONIN I

## 2013-10-01 ENCOUNTER — Ambulatory Visit: Payer: Self-pay | Admitting: Pain Medicine

## 2013-10-31 ENCOUNTER — Ambulatory Visit: Payer: Self-pay | Admitting: Pain Medicine

## 2013-11-28 ENCOUNTER — Ambulatory Visit: Payer: Self-pay | Admitting: Pain Medicine

## 2013-12-31 ENCOUNTER — Ambulatory Visit: Payer: Self-pay | Admitting: Pain Medicine

## 2014-01-15 DIAGNOSIS — E041 Nontoxic single thyroid nodule: Secondary | ICD-10-CM | POA: Insufficient documentation

## 2014-01-30 ENCOUNTER — Ambulatory Visit: Payer: Self-pay | Admitting: Pain Medicine

## 2014-02-07 ENCOUNTER — Encounter: Payer: Self-pay | Admitting: Podiatry

## 2014-02-07 ENCOUNTER — Ambulatory Visit (INDEPENDENT_AMBULATORY_CARE_PROVIDER_SITE_OTHER): Payer: Medicare HMO

## 2014-02-07 ENCOUNTER — Ambulatory Visit (INDEPENDENT_AMBULATORY_CARE_PROVIDER_SITE_OTHER): Payer: Medicare HMO | Admitting: Podiatry

## 2014-02-07 VITALS — BP 135/69 | HR 83 | Resp 16 | Ht 69.0 in | Wt 242.0 lb

## 2014-02-07 DIAGNOSIS — E1149 Type 2 diabetes mellitus with other diabetic neurological complication: Secondary | ICD-10-CM

## 2014-02-07 DIAGNOSIS — M779 Enthesopathy, unspecified: Secondary | ICD-10-CM

## 2014-02-07 DIAGNOSIS — E1140 Type 2 diabetes mellitus with diabetic neuropathy, unspecified: Secondary | ICD-10-CM

## 2014-02-07 DIAGNOSIS — E1142 Type 2 diabetes mellitus with diabetic polyneuropathy: Secondary | ICD-10-CM

## 2014-02-07 DIAGNOSIS — I739 Peripheral vascular disease, unspecified: Secondary | ICD-10-CM

## 2014-02-07 NOTE — Progress Notes (Signed)
Subjective:     Patient ID: Kimberly Shaffer, female   DOB: Mar 22, 1951, 63 y.o.   MRN: [id number]  Foot Pain   patient presents with pain across the forefoot left and also complains of pain in both legs especially in the calf muscles when trying to do any form of walking with a history of heart attacks and stroke  Review of Systems  All other systems reviewed and are negative.      Objective:   Physical Exam  Nursing note and vitals reviewed. Constitutional: She is oriented to person, place, and time.  Musculoskeletal: Normal range of motion.  Neurological: She is oriented to person, place, and time.  Skin: Skin is warm.   neurovascular status was found to be compromised with range of motion adequate and subtalar joint motion found to be normal. I noted there to be normal muscle strength and there is digits that are reasonably perfused but again I'm concerned about proximal  arterial disease. Pain in the lesser metatarsophalangeal joints of both feet    Assessment:     Cannot rule out PVD as a bit part of the pain she is experiencing versus localize tendinitis capsulitis    Plan:     H&P and x-rays reviewed. Metatarsal pad with thickness applied to the bottom of the foot to take pressure off the left and she'll use pads and we're sending for vascular evaluation to rule out disease

## 2014-02-07 NOTE — Progress Notes (Signed)
Referred to [facility] for abi's  Positive claudication , and decreased pulses

## 2014-02-07 NOTE — Progress Notes (Signed)
   Subjective:    Patient ID: Kimberly Shaffer, female    DOB: 1951/05/01, 63 y.o.   MRN: [id number]  HPI Comments: My big toe and 2nd toe on my left foot is numb. Its been like this for 2 - 3 months. Its getting worse. i keep a sock on it. Standing, walking and shoes will bother my toes.  Foot Pain Associated symptoms include abdominal pain, chest pain, numbness, a sore throat and weakness.      Review of Systems  Constitutional: Positive for appetite change and unexpected weight change.       Sweating   HENT: Positive for sore throat and trouble swallowing.        Sinus problems   Eyes: Positive for visual disturbance.  Respiratory: Positive for chest tightness, shortness of breath and wheezing.   Cardiovascular: Positive for chest pain.       Calf pain when walking  Gastrointestinal: Positive for abdominal pain.  Endocrine: Positive for cold intolerance and heat intolerance.  Genitourinary: Positive for urgency.  Musculoskeletal:       Joint pain Back pain Difficulty walking Muscle pain   Neurological: Positive for dizziness, weakness and numbness.  Psychiatric/Behavioral: Positive for behavioral problems. The patient is nervous/anxious.   All other systems reviewed and are negative.      Objective:   Physical Exam        Assessment & Plan:

## 2014-02-26 ENCOUNTER — Ambulatory Visit: Payer: Self-pay | Admitting: Pain Medicine

## 2014-03-25 ENCOUNTER — Ambulatory Visit: Payer: Self-pay | Admitting: Pain Medicine

## 2014-03-25 DIAGNOSIS — D509 Iron deficiency anemia, unspecified: Secondary | ICD-10-CM | POA: Insufficient documentation

## 2014-04-08 ENCOUNTER — Inpatient Hospital Stay: Payer: Self-pay | Admitting: Specialist

## 2014-04-08 LAB — CBC
HCT: 23.5 % — ABNORMAL LOW (ref 35.0–47.0)
HGB: 7.1 g/dL — ABNORMAL LOW (ref 12.0–16.0)
MCH: 25.2 pg — ABNORMAL LOW (ref 26.0–34.0)
MCHC: 30.1 g/dL — ABNORMAL LOW (ref 32.0–36.0)
MCV: 84 fL (ref 80–100)
PLATELETS: 189 10*3/uL (ref 150–440)
RBC: 2.8 10*6/uL — ABNORMAL LOW (ref 3.80–5.20)
RDW: 22.8 % — ABNORMAL HIGH (ref 11.5–14.5)
WBC: 7.9 10*3/uL (ref 3.6–11.0)

## 2014-04-08 LAB — CK TOTAL AND CKMB (NOT AT ARMC)
CK, Total: 166 U/L
CK, Total: 164 U/L
CK-MB: 2.2 ng/mL (ref 0.5–3.6)
CK-MB: 2.4 ng/mL (ref 0.5–3.6)

## 2014-04-08 LAB — BASIC METABOLIC PANEL
Anion Gap: 11 (ref 7–16)
BUN: 42 mg/dL — ABNORMAL HIGH (ref 7–18)
CALCIUM: 8.6 mg/dL (ref 8.5–10.1)
CHLORIDE: 115 mmol/L — AB (ref 98–107)
CREATININE: 4.05 mg/dL — AB (ref 0.60–1.30)
Co2: 19 mmol/L — ABNORMAL LOW (ref 21–32)
EGFR (Non-African Amer.): 11 — ABNORMAL LOW
GFR CALC AF AMER: 13 — AB
Glucose: 127 mg/dL — ABNORMAL HIGH (ref 65–99)
OSMOLALITY: 301 (ref 275–301)
Potassium: 4 mmol/L (ref 3.5–5.1)
Sodium: 145 mmol/L (ref 136–145)

## 2014-04-08 LAB — TROPONIN I
Troponin-I: 0.06 ng/mL — ABNORMAL HIGH
Troponin-I: 0.07 ng/mL — ABNORMAL HIGH
Troponin-I: 0.08 ng/mL — ABNORMAL HIGH

## 2014-04-08 LAB — OCCULT BLOOD X 1 CARD TO LAB, STOOL: Occult Blood, Feces: NEGATIVE

## 2014-04-08 LAB — PRO B NATRIURETIC PEPTIDE: B-TYPE NATIURETIC PEPTID: 3122 pg/mL — AB (ref 0–125)

## 2014-04-09 DIAGNOSIS — R079 Chest pain, unspecified: Secondary | ICD-10-CM

## 2014-04-09 LAB — CBC WITH DIFFERENTIAL/PLATELET
Basophil #: 0 10*3/uL (ref 0.0–0.1)
Basophil %: 0.5 %
Eosinophil #: 0.2 10*3/uL (ref 0.0–0.7)
Eosinophil %: 2.4 %
HCT: 29 % — ABNORMAL LOW (ref 35.0–47.0)
HGB: 9.3 g/dL — ABNORMAL LOW (ref 12.0–16.0)
Lymphocyte #: 1.5 10*3/uL (ref 1.0–3.6)
Lymphocyte %: 18.5 %
MCH: 26.8 pg (ref 26.0–34.0)
MCHC: 31.9 g/dL — AB (ref 32.0–36.0)
MCV: 84 fL (ref 80–100)
MONO ABS: 0.6 x10 3/mm (ref 0.2–0.9)
Monocyte %: 7.9 %
Neutrophil #: 5.8 10*3/uL (ref 1.4–6.5)
Neutrophil %: 70.7 %
Platelet: 191 10*3/uL (ref 150–440)
RBC: 3.46 10*6/uL — ABNORMAL LOW (ref 3.80–5.20)
RDW: 21.9 % — ABNORMAL HIGH (ref 11.5–14.5)
WBC: 8.2 10*3/uL (ref 3.6–11.0)

## 2014-04-09 LAB — BASIC METABOLIC PANEL
ANION GAP: 7 (ref 7–16)
BUN: 36 mg/dL — ABNORMAL HIGH (ref 7–18)
Calcium, Total: 8.9 mg/dL (ref 8.5–10.1)
Chloride: 113 mmol/L — ABNORMAL HIGH (ref 98–107)
Co2: 20 mmol/L — ABNORMAL LOW (ref 21–32)
Creatinine: 3.34 mg/dL — ABNORMAL HIGH (ref 0.60–1.30)
EGFR (African American): 16 — ABNORMAL LOW
EGFR (Non-African Amer.): 14 — ABNORMAL LOW
Glucose: 86 mg/dL (ref 65–99)
Osmolality: 287 (ref 275–301)
POTASSIUM: 4.5 mmol/L (ref 3.5–5.1)
SODIUM: 140 mmol/L (ref 136–145)

## 2014-04-09 LAB — LIPID PANEL
Cholesterol: 138 mg/dL (ref 0–200)
HDL: 39 mg/dL — AB (ref 40–60)
Ldl Cholesterol, Calc: 75 mg/dL (ref 0–100)
Triglycerides: 119 mg/dL (ref 0–200)
VLDL Cholesterol, Calc: 24 mg/dL (ref 5–40)

## 2014-04-09 LAB — MAGNESIUM: Magnesium: 1.5 mg/dL — ABNORMAL LOW

## 2014-04-22 ENCOUNTER — Ambulatory Visit: Payer: Self-pay | Admitting: Pain Medicine

## 2014-05-22 ENCOUNTER — Ambulatory Visit: Payer: Self-pay | Admitting: Pain Medicine

## 2014-06-19 ENCOUNTER — Ambulatory Visit: Payer: Self-pay | Admitting: Pain Medicine

## 2014-07-22 ENCOUNTER — Ambulatory Visit: Payer: Self-pay | Admitting: Pain Medicine

## 2014-08-04 ENCOUNTER — Ambulatory Visit: Payer: Self-pay | Admitting: Pain Medicine

## 2014-08-25 ENCOUNTER — Ambulatory Visit: Payer: Self-pay | Admitting: Pain Medicine

## 2014-09-23 ENCOUNTER — Ambulatory Visit: Payer: Self-pay | Admitting: Pain Medicine

## 2014-10-23 ENCOUNTER — Ambulatory Visit: Payer: Self-pay | Admitting: Pain Medicine

## 2014-11-04 ENCOUNTER — Ambulatory Visit: Payer: Self-pay

## 2014-11-12 ENCOUNTER — Emergency Department: Payer: Self-pay | Admitting: Emergency Medicine

## 2014-11-20 ENCOUNTER — Ambulatory Visit: Payer: Self-pay | Admitting: Pain Medicine

## 2014-12-23 ENCOUNTER — Ambulatory Visit
Admit: 2014-12-23 | Disposition: A | Discharge status: Home / Self Care | Payer: Self-pay | Attending: Pain Medicine | Admitting: Pain Medicine

## 2015-01-09 NOTE — H&P (Signed)
PATIENT NAME:  Kimberly Shaffer, Kimberly Shaffer MR#:  [id number] DATE OF BIRTH:  19-Aug-1951  DATE OF ADMISSION:  06/25/2013  PRIMARY CARE PHYSICIAN: Nonlocal.    REFERRING PHYSICIAN: Dr. Jasmine December.   CHIEF COMPLAINT: Left foot pain.   HISTORY OF PRESENT ILLNESS: The patient is a 64 year old African American female with a recent history of acute MI. Was admitted to [facility] on September 17th and was transferred to [facility] on September 22nd for further staging of her high-risk coronary artery disease lesion. The patient had a cardiac catheterization done during the hospital course at [facility] and also at [facility] after transferring her from [facility]. The patient had 2 cardiac caths in a period of 1 week. The patient actually came into the ER with a chief complaint of left foot pain. The patient is describing that whenever she puts her foot on the floor, the heel part is hurting. Denies any chest pain or shortness of breath. The patient has chronic renal insufficiency and her baseline creatinine is at 3.30. Today, her creatinine is at 5.33 and BUN is elevated at 67. The patient was given IV fluids, and hospitalist team is called to admit the patient. The patient's potassium is high at 5.5, but no EKG changes were revealed. The patient denies any chest pain or shortness of breath. The patient has received Kayexalate in the ER. During my examination, the patient is reporting that she is hungry but denies any other complaints. Denies any abdominal pain, nausea, vomiting. CAT scan of the abdomen was done in the ER which was negative.   PAST MEDICAL HISTORY: Hypertension, hyperlipidemia, coronary artery disease status post stenting and CABG in the past, recent history of acute MI during September 17th admission and had cardiac cath which has revealed occluded RCA and LIMA to LAD with patent stent to left circumflex and collaterals to RCA, 95% LAD stenosis. The patient was transferred to [facility]  for further staging of the high-risk coronary artery disease lesion. Peripheral vascular disease status post stenting, diabetes mellitus, obstructive sleep apnea and currently not using CPAP, depression, hypothyroidism, GERD.   PAST SURGICAL HISTORY: Bilateral knee surgery, cardiac stents x6, CABG in 2012, recent cardiac caths in September 2014 x2 in a period of 1 week.   FAMILY HISTORY: Coronary artery disease, hypertension and stroke.   PSYCHOSOCIAL HISTORY: Lives at home. Smokes 1/3 pack a day. Denies alcohol or illicit drug usage.   HOME MEDICATIONS: Aspirin 81 mg once daily, Plavix 75 mg once daily, pantoprazole 40 mg once daily, oxycodone 10 mg 2 to 4 tablets as needed, nitroglycerin sublingually as needed, metoprolol succinate 100 mg once daily,  folic acid 1 mg once daily, Cymbalta 60 mg once daily, atorvastatin 80 mg once daily, Amitiza 24 mcg p.o. once daily.   REVIEW OF SYSTEMS:   CONSTITUTIONAL: Denies any fever or fatigue.  EYES: Denies blurry vision, glaucoma.  ENT: Denies epistaxis, discharge.  RESPIRATION: Denies cough, COPD. Has obstructive sleep apnea.  CARDIOVASCULAR: Denies chest pain, palpitations. Has recent acute MI.  GASTROINTESTINAL: Denies nausea, vomiting, diarrhea.  GENITOURINARY: No dysuria, hematuria.  GYNECOLOGIC AND BREASTS: Denies breast mass or vaginal discharge.  HEMATOLOGIC AND LYMPHATIC: No anemia, easy bruising, bleeding.  INTEGUMENTARY: No acne, rash, lesions.  MUSCULOSKELETAL: Complaining of left foot pain. Denies gout.  NEUROLOGIC: Denies vertigo, ataxia.  PSYCHIATRIC: Denies ADD, OCD.   PHYSICAL EXAMINATION:  VITAL SIGNS: Temperature 98.9, pulse 67, respirations 18, blood pressure 107/44, pulse ox 98%.  GENERAL APPEARANCE: Not in any acute  distress. Moderately built and obese.  HEENT: Normocephalic, atraumatic. Pupils are equally reacting to light and accommodation. No scleral icterus. No conjunctival injection. No sinus tenderness. No postnasal  drip.  NECK: Supple. No JVD. No thyromegaly. Range of motion is intact.  LUNGS: Clear to auscultation bilaterally. No accessory muscle usage. No anterior chest wall tenderness on palpation.  CARDIAC: S1, S2 normal. Regular rate and rhythm. No murmurs.  GASTROINTESTINAL: Soft, obese. Bowel sounds are positive in all 4 quadrants. Nontender, nondistended. No hepatosplenomegaly.  NEUROLOGIC: Alert and oriented x3. Motor and sensory grossly intact. Reflexes are 2+.  EXTREMITIES: No edema. No cyanosis. No clubbing.  SKIN: Warm to touch. Normal turgor. No rashes. No lesions.  MUSCULOSKELETAL: No joint effusion, tenderness, erythema.  PSYCHIATRIC: Normal mood and affect.   LABORATORIES AND IMAGING STUDIES: CAT scan of the abdomen and pelvis with p.o. contrast: No acute changes. A 12-lead EKG: Normal sinus rhythm. Nonspecific T wave abnormality. No acute ST-T wave changes. Glucose 98. BUN went up to 67 from 31 in September. Creatinine is 5.33 which has trended up from 3.30 in September. Sodium 137, potassium 5.5, chloride 112, CO2 17. GFR 9. Anion gap is 8. Calcium 9.7. Serum osmolality 294. WBC 11.0, hemoglobin 9.1, hematocrit 28.3, platelets 246. Urinalysis: Nitrite and leuk esterase are negative.   ASSESSMENT AND PLAN: This 64 year old African American female came into the Emergency Room for left foot pain. She is being admitted with the following assessment and plan:  1. Acute kidney injury with hypokalemia, probably from contrast used during 2 recent cardiac catheterizations in a duration of 1 week regarding acute myocardial infarction. Will admit her to telemetry. Will provide her intravenous fluids. Will obtain renal ultrasound. Foley catheter to monitor urine output. Nephrology consult is placed to Dr. Holley Raring.  2. Recent acute myocardial infarction: Will continue her home medications, aspirin, Plavix, statin and beta blocker. The patient denies any chest pain now.   3. Diabetes mellitus: Continue  sliding scale insulin.  4. Hypertension: Resume her home blood pressure medications and titrate on as needed basis.  5. Chronic history of obstructive sleep apnea: The patient is not using CPAP recently.  6. Will provide gastrointestinal and deep vein thrombosis prophylaxis.   She is FULL CODE. Son is her medical power of attorney.   Diagnosis and plan of care was discussed in detail with the patient. She is aware of the plan.   TOTAL TIME SPENT ON ADMISSION: 45 minutes.   ____________________________ Nicholes Mango, MD ag:gb D: 06/26/2013 03:28:58 ET T: 06/26/2013 03:59:12 ET JOB#: [id number]  cc: Nicholes Mango, MD, <Dictator> Primary Care Physician Nicholes Mango MD ELECTRONICALLY SIGNED 06/29/2013 11:40

## 2015-01-09 NOTE — Discharge Summary (Signed)
PATIENT NAME:  Kimberly Shaffer, Kimberly Shaffer MR#:  [id number] DATE OF BIRTH:  12-01-1950  DATE OF ADMISSION:  06/26/2013 DATE OF DISCHARGE:  06/27/2013  PRESENTING COMPLAINT: Left foot pain.   DISCHARGE DIAGNOSES:  1.  Acute on chronic renal failure secondary to intravenous contrast nephropathy.  2.  Hypertension.  3.  Coronary artery disease, status post previous stents at [facility] in September 2014.  4.  Hypertension.  5.  Obesity.  6.  Chronic kidney disease, stage IV.   CODE STATUS: Full code.   MEDICATIONS:  1.  Folic acid 1 mg p.o. daily.  2.  Atorvastatin 80 mg at bedtime.  3.  Cymbalta 60 mg daily.  4.  Metoprolol ER 100 mg 1-1/2 tablets daily.  5.  Plavix 75 mg daily.  6. zyrtec10 mg daily.  7.  Aspirin 81 mg 2 tablets daily.  8.  Protonix 40 mg daily.  9.  Garlic 1 capsule p.o. daily.  10. Amitiza 24 mcg p.o. daily.  11. Nitroglycerin sublingual spray as needed. 12. Oxycodone 100 mg 1 to 2 as needed.  13. Methocarbamol 750 mg 1 tablet 4 times a day.  14. Woman a day 1 tablet daily.  15. Meclizine 25 mg 1 tablet 3 times a day as needed.  16. Amlodipine 110 mg p.o. daily.  17. Humira pen 40 mg per point 8 mL subcutaneous b.i.d. as needed.   DIET: Renal diet.   FOLLOWUP: With Dr. Holley Raring in 1 to 2 weeks. Follow up with  Dr. Larae Grooms. [facility] first available appointment for AV fistula mapping.   LABS: Creatinine at discharge is 4.31, BUN is 52, glucose is 103, sodium is 139, potassium is 4.9 and chloride is 114. Ultrasound of the kidneys shows no evidence of any obstructive uropathy. CT of the abdomen and pelvis without contrast showed no hydronephrosis or nephrolithiasis. There is mild abdominal aortic aneurysm. Common iliac arteries are mildly prominent. A  scleral calcification is noted. Right iliac stent is present. There is colonic diverticulosis without evidence of diverticulitis. Creatinine on admission was 5.33.   NEPHROLOGY CONSULTATION: Dr. Candiss Norse.   BRIEF SUMMARY OF HOSPITAL  COURSE: Kimberly Shaffer is 64 year old African American female, who came into the Emergency Room with left foot pain. She was found to have elevated creatinine to 5.0 and hyperkalemia. She is being admitted with:  1.  Acute on chronic injury with hyperkalemia, probably from contrast used during 2 recent cardiac catheterizations in the duration of a week for her acute myocardial infarction. The patient has had 3 new stents placed at [facility] in 05/2013, already has 6 stents. She was started on IV fluids. Her potassium stabilized and her creatinine trended down. She was feeling well. Dr. Candiss Norse saw the patient and there was no urgent indication for hemodialysis; however, the patient is going to be set up at Dr. Dew/Dr. Delana Meyer for AV fistula mapping in anticipation for dialysis needs down the road.  2.  Recent acute MI with 3 new stent placements in 05/2013 at [facility]. Continue aspirin, Plavix, statins and beta blockers. The patient had no chest pain.  3.  Type 2 diabetes. Continue sliding scale insulin.  4.  Hypertension. Home meds were resumed.  5.  Chronic history of obstructive sleep apnea. The patient currently is not using her CPAP.  6.  DVT prophylaxis was with subcu heparin.  7.  Hospital stay otherwise remained stable.   CODE STATUS: The patient remained a full code.   TIME SPENT: 40 minutes.  ____________________________ Hart Rochester  Posey Pronto, MD sap:aw D: 06/28/2013 07:28:11 ET T: 06/28/2013 08:04:44 ET JOB#: [id number]  cc: Rache Klimaszewski A. Posey Pronto, MD, <Dictator> Ilda Basset MD ELECTRONICALLY SIGNED 07/12/2013 15:03

## 2015-01-09 NOTE — H&P (Signed)
PATIENT NAME:  Kimberly Shaffer, Kimberly Shaffer MR#:  [id number] DATE OF BIRTH:  13-Apr-1951  DATE OF ADMISSION:  07/21/2013  PRIMARY CARE PHYSICIAN: None local.  REFERRING PHYSICIAN: Dr. Beather Arbour   CHIEF COMPLAINT: Left-sided numbness.   HISTORY OF PRESENT ILLNESS: The patient is a 64 year old African American female with past medical of recent history of coronary artery disease status post CABG, recent  history of acute MI during September 2014, status post cardiac catheterization, obstructive sleep apnea on CPAP, and old history of strokes; one in 1999 and the other one in 2000, with no deficits, and multiple other medical problems is presenting to the ER with a chief complaint of left-sided numbness since yesterday 9 a.m. The patient is reporting that the day before yesterday she was having a headache, and then yesterday morning since 9 a.m. she started having left-sided numbness. She is reporting that the left side of the face is numb, including the left shoulder and left leg. She was unsteady while walking because she could not feel her legs. Denies any loss of consciousness. Denies any speech difficulties or swallowing difficulties. Felt dizzy, but denies any loss of consciousness. The patient takes aspirin, Plavix and statin, as she had 2 episodes of stroke in the past. She is reporting that symptoms are not going away, which is concerning to her. In the ER, CAT scan of the head is done which has revealed a left caudate head lacunar infarct. Hospitalist team is called to admit the patient. During my examination, the patient is tired and falling asleep, but arousable and answering questions appropriately. No family members are at bedside.   PAST MEDICAL HISTORY: Chronic renal insufficiency stage 4, hypertension, hyperlipidemia, coronary artery disease, status post CABG in the past, recent history of MI in September 2014, obstructive sleep apnea, not on CPAP, depression, hypothyroidism, peripheral vascular disease,  status post stenting, diabetes mellitus and GERD.  PAST SURGICAL HISTORY: Status post coronary artery bypass grafting in 2012, bilateral knee surgery, cardiac stents x6, recent cardiac catheterization in September 2014.  ALLERGIES:  NYSTATIN AND SULFA DRUGS.   PSYCHOSOCIAL HISTORY: Lives at home. Smokes 1 pack per day.  Denies any alcohol or illicit drug usage.   FAMILY HISTORY: Coronary artery disease and hypertension, stroke runs in her family.   HOME MEDICATIONS: Pantoprazole 40 mg p.o. once daily, oxycodone 10 mg 2 tablets p.o. daily, nitroglycerin 0.4 mg sublingually every 5 minutes as needed for chest pain, metoprolol succinate 1 tablet p.o. once daily, meclizine 25 mg 3 times a day, folic acid 1 mg once daily, Cymbalta 60 mg once daily, Plavix 75 mg once daily, atorvastatin 80 mg once a day, aspirin 81 mg once daily, amlodipine 10 mg once daily, Amitiza 24 mcg 1 capsule p.o. once daily.  REVIEW OF SYSTEMS: CONSTITUTIONAL:  Denies any fever or fatigue.  EYES: Denies blurry vision or double vision.  ENT: Denies epistaxis, discharge.  RESPIRATION: Denies cough, COPD. Has chronic history of obstructive sleep apnea.  CARDIOVASCULAR: Denies chest pain or palpitations.  GASTROINTESTINAL: Denies nausea, vomiting, diarrhea.  GENITOURINARY: No dysuria or hematuria.  GYNECOLOGIC AND BREASTS: Denies breast mass or vaginal discharge.  ENDOCRINE: Denies polyuria, nocturia or thyroid problems.  HEMATOLOGIC AND LYMPHATIC: No anemia, easy bruising or bleeding.  INTEGUMENTARY: No rash or lesions.  MUSCULOSKELETAL: No joint pain in the neck and back. Denies gout.  NEUROLOGIC:  Complaining of left-sided numbness including face. Denies any vertigo. Denies dysarthria or dysphagia.  PSYCHIATRIC: No ADD or OCD.  PHYSICAL EXAMINATION: VITAL SIGNS:  Temperature 97.7, pulse 70, respirations 20, blood pressure 121/55, pulse ox 98% on room air.  GENERAL APPEARANCE: Not in any acute distress. Moderately built  and nourished.  HEENT: Normocephalic, atraumatic. Pupils are equally reacting to light and accommodation. No scleral icterus. No conjunctival injection. No sinus tenderness. Moist mucous membranes.  NECK: Supple. No JVD. No thyromegaly. No carotid bruits.  LUNGS: Clear to auscultation bilaterally. No accessory muscle usage. No anterior chest wall tenderness on palpation.  CARDIAC: S1, S2 normal. Regular rate and rhythm. No clicks. No gallops.  GASTROINTESTINAL: Soft. Bowel sounds are positive in all 4 quadrants. Nontender, nondistended. No hepatosplenomegaly. No masses felt.  NEUROLOGIC:  Alert, awake and oriented x3.  Motor is grossly intact. Sensory:  Decreased touch sensation on the left side of the face, left shoulder and left lower extremity. Negative cerebellar signs. Reflexes are 2+.  EXTREMITIES: No edema. No cyanosis. No clubbing.  MUSCULOSKELETAL: No joint effusion, tenderness, erythema.  SKIN: Warm to touch. Normal turgor. No rashes. No lesions. PSYCHIATRIC:  Normal mood and affect.   LABORATORY AND IMAGING STUDIES: CAT scan of the head without contrast has revealed mild atrophy, chronic microvascular ischemic disease. Left caudate head lacunar infarct. Glucose 133, BUN 63, creatinine 4.57, sodium 137, potassium 4.1, chloride 112, CO2 18, GFR 11, anion gap 7, serum osmolality 294, calcium 9.3. WBC 8.8, hemoglobin 9.7, hematocrit 28.5, platelets 222. LFTs: Total protein 8.9; rest of the LFTs are normal except AST, which is elevated at 39. A 12-lead EKG has revealed normal sinus rhythm with right axis deviation, nonspecific ST-T wave changes.   ASSESSMENT AND PLAN: A 64 year old African American female, presenting to the Emergency Room with a chief complaint of left-sided numbness and dizziness. Will be admitted with the following assessment and plan:  1.  Acute cerebrovascular accident with left-sided numbness and dizziness. We will admit her to telemetry bed. We will provide her aspirin,  Plavix and statin. Stroke work-up with MRI of brain, carotid Doppler studies. A 2-D echocardiogram was recently done in September 2013 which has revealed left ventricular ejection fraction of 55% to 60%. We will obtain neuro checks and swallowing evaluation will be done, and if it is normal, the patient will be on ADA 2 gram diet. 2.  Chronic renal insufficiency, stage 4. The patient is to follow up with nephrology as scheduled.  3.  Coronary artery disease, stable. Denies any chest pain. Will resume her home medication.  4.  Obstructive sleep apnea. The patient is not on CPAP at nighttime.  5.  Diabetes mellitus.  The patient will be on sliding scale insulin.  6.  We will provide her gastrointestinal and deep vein thrombosis prophylaxis with Protonix and heparin subcutaneous, respectively.   Diagnosis and plan of care was discussed in detail with the patient.  She is aware of the plan.  She is FULL CODE. Son is the medical power of attorney.   Total time spent on admission was 45 minutes.     ____________________________ Nicholes Mango, MD ag:cg D: 07/21/2013 01:37:15 ET T: 07/21/2013 01:33:42 ET JOB#: [id number]  cc: Nicholes Mango, MD, <Dictator> Nicholes Mango MD ELECTRONICALLY SIGNED 08/04/2013 8:06
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
## 2015-01-09 NOTE — Discharge Summary (Signed)
PATIENT NAME:  CADDIE, TORRENCE MR#:  [id number] DATE OF BIRTH:  1951/05/20  DATE OF ADMISSION:  06/05/2013 DATE OF DISCHARGE:  06/10/2013  DISPOSITION:  [facility].  DISCHARGE DIAGNOSIS:  Chest pain secondary to coronary artery disease. The patient's chest pain is secondary to unstable angina. The patient had a cardiac catheterization which showed occluded RCA and LIMA to LAD with patent stent to left circumflex and collaterals to RCA, 95% LAD stenosis. Needs staged high risk coronary artery disease lesion treatment.   CONSULTATIONS: Cardiology with Dr. Nehemiah Massed and nephrology with Dr. Anthonette Legato.  PRESENT MEDICATIONS: 1.  IV fluids normal saline at 100 mL/hour. 2.  Heparin drip.  3.  Aspirin 81 mg daily. 4.  Atorvastatin 80 mg p.o. daily. 5.  Plavix 75 mg p.o. daily. 6.  Colace 100 mg p.o. b.i.d. p.r.n.  7.  Cymbalta 60 mg p.o. daily. 8.  Metoprolol succinate ER 100 mg p.o. daily. 9.  Morphine 2 mg q. 4 hours IV p.r.n. for chest pain.  10.  Senna as needed. 11.  Pantoprazole 40 mg p.o. daily. 12.  Zofran 4 mg IV q. 4 hours p.r.n. for nausea.  13.  Amlodipine 10 mg p.o. daily.  14.  Hydralazine 25 mg p.o. t.i.d.   VITAL SIGNS: Temperature 97.6, blood pressure 151/71, heart rate 63, sats 95% on room air.   HOSPITAL COURSE: A 64 year old female patient admitted on September 17th because of chest pain. The patient had history of bypass surgery at [facility] in 2012. Other history significant for diabetes, peripheral vascular disease, history of bilateral femoral stents, hyperlipidemia and hypertension. When the patient was admitted, the patient had mild T-wave inversions in leads II and III. The patient's initial troponin 0.06, second one 0.21. Admitted to telemetry. Started on aspirin, beta blocker, statin, nitrates and also heparin drip. The patient had a Lexiscan stress test initially because of her chronic kidney disease stage III to IV. The patient's stress test showed severe atrial wall defect  suggesting right coronary artery disease stenosis. The patient also had an echocardiogram which showed EF of 55% to 60% with normal systolic function, diastolic dysfunction.   The patient was kept over the weekend, and the patient was seen by nephrologist because of her CKD and nephrology was consulted for acute on chronic kidney disease stage IV. The patient's creatinine in October of last year, 2.13. Here creatinine trended up to 4, started on IV fluids. The patient also received 2 doses of Mucomyst. We continued fluids over the weekend for renal protection. The patient's urine output was very adequate and followed by nephrology. We continued IV fluids, normal saline, at 80 cc/hr, in preparation for cardiac cath. The patient did have a cardiac cath this morning and the results are as I described before with 95% stenosis of proximal LAD, in middle LAD 60% stenosis, ostial circumflex 30% stenosis, proximal circumflex 10% stenosis at the site of prior stent, RCA has 100% stenosis. There is 100% stenosis in distal third of graft. The patient advised to have a staged PCI of LAD so she is going to [facility] for that. This morning creatinine is 3.3 and BUN 31, white count 6.6.   DISCHARGE CONDITION: Stable for transfer.   TIME SPENT ON DISCHARGE PREPARATION: More than 30 minutes. ____________________________ Epifanio Lesches, MD sk:sb D: 06/10/2013 08:58:46 ET T: 06/10/2013 09:34:23 ET JOB#: [id number]  cc: Epifanio Lesches, MD, <Dictator> Mamie Levers, MD Corey Skains, MD PRIMARY Elly Modena MD ELECTRONICALLY SIGNED 06/22/2013 23:12

## 2015-01-09 NOTE — Consult Note (Signed)
General Aspect 44 morbidly obese African American female with history of CAD, cabbage at  [facility] in 2012, with no local cardiologist, history of PAD,  status post bilateral femoral stents, tobacco abuse and hypertension, hyperlipidemia, that presented to [facility] ER after having an episode of chest pressure/pain with radiation to the left jaw associated with diaphoresis, nausea and vomiting times one.  On arrival to the ER she has been comfortable with no recurrent chest pain.  Her EKG just shows new mild T-wave inversions in 2 and 3.  Deep T-wave inversions V3 through V 6 were present on an EKG from August of this year. Initial troponin was 0.06 and then 0.21.  Despite her renal insufficiency,   troponin with previous hospitalizations  have been negative. Due to not being a great cardiac catheterization candidate due to CKD, after discussion with Dr. Nehemiah Massed, since patient continues to be n.p.o., will try to get a Lexi Myoview in today. Patient states she has noted she's been becoming   increasingly dyspneic with exertion over the last several months, as well as more palpitations at times .She's also noticed more indigestion than usual, chest pressure with burping.   Physical Exam:  GEN well developed, no acute distress, obese   HEENT pink conjunctivae, moist oral mucosa   RESP normal resp effort  clear BS   CARD Regular rate and rhythm  Normal, S1, S2   ABD denies tenderness  normal BS   EXTR negative edema   SKIN normal to palpation   NEURO cranial nerves intact, motor/sensory function intact   PSYCH alert, A+O to time, place, person, good insight   Review of Systems:  Subjective/Chief Complaint Chest pain, dyspnea, resolved   General: Fatigue   Skin: No Complaints   Eyes: No Complaints   Neck: No Complaints   Respiratory: Short of breath   Cardiovascular: Palpitations  Dyspnea  chest pressure with burping   Gastrointestinal: Nausea  x1 with initial episode of  chest pain   Genitourinary: No Complaints   Vascular: No Complaints   Musculoskeletal: No Complaints   Neurologic: No Complaints   Hematologic: No Complaints   Endocrine: No Complaints   Psychiatric: No Complaints   Medications/Allergies Reviewed Medications/Allergies reviewed   Radiology Results: XRay:    16-Sep-14 23:18, Chest PA and Lateral  Chest PA and Lateral   REASON FOR EXAM:    Chest Pain  COMMENTS:       PROCEDURE: DXR - DXR CHEST PA (OR AP) AND LATERAL  - Jun 04 2013 11:18PM     RESULT: Comparison is made to the study of February 18, 2013.    The lungs are mildly hyperinflated. There are coarse lung markings in the   left mid and lower lung and in the right perihilar region which have not   greatly changed since the previous study. The cardiac silhouette is in is   normal in size. There is tortuosity of the descending thoracic aorta.   There is mild central pulmonary vascular prominent.    IMPRESSION:  There is no focal pneumonia. I cannot exclude perihilar   subsegmental atelectasis and atelectasis at the left lung base. There may     be underlying COPD as well. Followup films or chest CT scanning may be of   value given the persistent densities since June. Is there a smoking   history? Is there a history of CHF?     Dictation Site: 2        Verified  By: DAVID A. Martinique, M.D., MD    Sulfa drugs: Hives  Nystatin: Itching   Impression 64 year old  obese African American  female with history of CAD, status post bypass, PAD tobacco abuse hypertension hyperlipidemia with episode of chest pain with positive troponin and T-wave changes in the anterior leads, needing further cardiac evaluation to assess for ischemia.   Plan 1.  Discussed with Dr. Nehemiah Massed who prefers not to do a cardiac catheterization due to risk of contrast dye and chronic kidney disease with a creatinine of 4.therefore since patient is n.p.o.,  will go ahead and do a Lexi Myoview todaywith  further review and recommendations pending results of this, as well  surface echocardiogram. 2.  Continue IV heparin Plavix and aspirin until the results of above are known.  The patient's was seen in collaboration with Dr. Nehemiah Massed.   Electronic Signatures: Roderic Palau (NP)  (Signed 17-Sep-14 13:23)  Authored: General Aspect/Present Illness, History and Physical Exam, Review of System, Radiology, Allergies, Impression/Plan   Last Updated: 17-Sep-14 13:23 by Roderic Palau (NP)

## 2015-01-09 NOTE — H&P (Signed)
PATIENT NAME:  Kimberly Shaffer, Kimberly Shaffer MR#:  [id number] DATE OF BIRTH:  1951/07/21  DATE OF ADMISSION:  06/04/2013  PRIMARY CARE PHYSICIAN: [facility].   PRIMARY CARDIOLOGIST: The patient does not have a cardiologist, usually her PCP follows with her cardiac issues.   CHIEF COMPLAINT: Chest pain.   HISTORY OF PRESENT ILLNESS: This is a 64 year old female with significant past medical history of coronary artery disease with CABG at [facility] in 2012, diabetes mellitus, peripheral vascular disease, status post bilateral femoral stent, hyperlipidemia, hypertension, morbid obesity, presents with complaints of chest pain. The patient reports chest pain started this evening as well as accompanied by nausea or vomiting x 1 episode as well with some mild shortness of breath. The patient reports her chest pain improved with nitroglycerin spray at home. Currently, she is chest pain-free upon presentation to the ED. She reports chest pressure, chest pain was radiating to the left jaw. Reports this chest pain is like the chest pain when she had her MI in 2012. The patient's EKG did show a T-wave inversion in V6, 5, 4,  which was present in the last EKG, but now it appears to be extended to include as well V3 and V2 as well. The patient's first troponin was at 0.06. The patient is known to have history of chronic kidney disease, baseline around 3.5. Her creatinine today around 4.  Her troponins were essentially normal at baseline in the past, even though she has chronic kidney disease and they were positive  the past when she had her MI.  The patient received 324 of aspirin by EMS. Currently, reports she is chest pain-free. She has no shortness of breath, sleeping comfortably. Denies any fever, any chills. Complains of mild cough, dry as well complains of headache in the past but nothing currently. Hospitalist service was requested to admit the patient for further management and work-up of her chest pain.   PAST MEDICAL  HISTORY: 1.  Hypertension.  2.  Hyperlipidemia.  3.  Coronary artery disease, status post stenting and CABG.  4.  Peripheral vascular disease status post stenting.  5.  Diabetes mellitus.  6.  Obstructive sleep apnea on CPAP.  7.  Depression.  8.  Hypothyroidism.  9.  Gastroesophageal reflux disease.   PAST SURGICAL HISTORY:  1.  Knee surgery bilaterally.  2.  Cardiac stents x 6.  3.  CABG in 2012.   FAMILY HISTORY: Significant for coronary artery disease, hypertension and CVA.   SOCIAL HISTORY: The patient still smokes up to 1/2 pack per day. No alcohol. No illicit drug use.   HOME MEDICATIONS:   1.  Aspirin 81 mg oral daily.  2.  Cymbalta 60 mg oral daily.  3.  Meclizine 25 mg 3 times a day as needed for dizziness.  4.Cetirizine 10 mg oral daily.  5.  Plavix 75 mg oral daily.  6.  Atorvastatin 80 mg oral daily.  7.  Metoprolol succinate extended release 100 mg oral 1-1/2 tablets daily.  8.  Norvasc 10 mg daily.  9.  Amitiza 24 mcg oral daily.  10. Garlic oral capsule once daily.  11.  Humira pen insulin.  12.  Methocarbamol 750 mg oral 4 times a day.  13.  Protonix 40 mg oral daily.  14.  Folic acid 1 mg oral daily.  15.  Nitroglycerin spray as needed.  16.  Woman's One-A-Day one oral tablet daily.   REVIEW OF SYSTEMS: CONSTITUTIONAL: Denies fever, chills, fatigue, weakness, weight gain, weight loss.  EYES: Denies blurry vision, double vision, inflammation, glaucoma.  ENT: Denies tinnitus, ear pain, epistaxis or discharge.  RESPIRATORY: Complains of cough. Denies any wheezing, hemoptysis, painful respiratory or chronic obstructive pulmonary disease.  CARDIOVASCULAR: Complains of chest pain radiating to the jaw, currently resolved. Denies edema, arrhythmia, palpitations, syncope.  GASTROINTESTINAL: Had one episode of nausea and vomiting. Denies any diarrhea, abdominal pain, hematemesis, jaundice, rectal bleed.  GENITOURINARY: Denies dysuria, hematuria or renal colic.   ENDOCRINE: Denies polyuria, polydipsia, heat or cold intolerance.  HEMATOLOGY: Denies anemia, easy bruising, bleeding, diathesis.  INTEGUMENTARY: Denies acne, rash or skin lesions.  MUSCULOSKELETAL: Complains of arthritis. Denies any gout or cramps.  NEUROLOGIC: Denies CVA, transient ischemic attack, dementia, tremors, migraine.  PSYCHIATRIC: Denies any anxiety, insomnia, substance abuse, alcohol abuse. Has history of depression.   PHYSICAL EXAMINATION: VITAL SIGNS: Temperature 99.4, pulse 88, respiratory rate 18, blood pressure 132/68, saturating 96% on room air.  GENERAL: Morbidly obese female who looks comfortable in bed, in no apparent distress.  HEENT: Head is atraumatic, normocephalic. Pupils equal, reactive to light. Pink conjunctivae. Anicteric sclerae. Moist oral mucosa. No oral thrush. No nasal mucosa erythema.  NECK: Supple. No thyromegaly. No JVD.  CHEST: Good air entry bilaterally. No wheezing, rales or rhonchi.  CARDIOVASCULAR: S1, S2 heard. No rubs, murmurs, gallops. Regular rate and rhythm.  ABDOMEN: Morbidly obese, soft, nontender, nondistended. Bowel sounds present.  EXTREMITIES: No edema. No clubbing. No cyanosis. Dorsalis pedis pulses were diminished bilaterally, but they could be felt, but feet are warm and not cold. No ischemic changes. Good capillary refill time.  PSYCHIATRIC: Appropriate affect. Awake, alert x 3. Intact judgment and insight.  NEUROLOGIC: Cranial nerves grossly intact. Motor 5/5. No focal deficits. Sensation symmetrical intact to light touch.  LYMPHATICS: No cervical or supraclavicular lymphadenopathy.  MUSCULOSKELETAL: No joint effusion or erythema could be appreciated.   PERTINENT LABORATORY DATA: Glucose 148, BUN 54, creatinine 4.05, sodium 142, potassium 3.8, chloride 113, CO2 24, troponin 0.06, CK total 1.4, CK-MB 1.1.   Hemoglobin 9.9, hematocrit 30.1, platelet 228,000, white blood cells 8.4.   EKG showing normal sinus rhythm at 73 beats per  minute with Q wave inversion from V3 through V6.  V3 appears to be new and there is a flattening of T waves in V2   ASSESSMENT AND PLAN: 1.  Chest pain. The patient's findings are concerning for acute coronary syndrome. Given the fact she has elevated troponin from her baseline, has new EKG changes, as well her chest pain resembles chest pain to her last myocardial infarction and her symptoms resolved with nitroglycerin, the patient will be admitted to telemetry unit. We will continue to cycle her cardiac enzymes. She already received 324 of aspirin. She will be started on heparin drip. She will be continued on aspirin and Plavix, beta blocker, statin and as needed nitroglycerin. We will consult cardiology service. Meanwhile, the patient will be kept nothing oral to see if cardiology wants any further work-up in the morning.  2.  Tobacco abuse. The patient was counseled at length. She will be started on NicoDerm patch.  3.  Chronic kidney disease. Appears to be mildly worsened. We will avoid nephrotoxic medications. Will hydrate.  4.  Diabetes mellitus. We will continue the patient on insulin sliding scale.  5.  Hypertension. Blood pressure acceptable. Continue home meds.  6.  Hyperlipidemia. Continue with statin.  7.  Hypothyroidism. Continue with Synthroid.  8.  Gastroesophageal reflux disease. Continue with Protonix.  9.  Depression. Continue with home  meds.  10.  History of coronary artery disease, status post coronary artery bypass graft. The patient will be continued on aspirin, Plavix, statin, beta blockers and certainly no ACE inhibitor due to her renal failure.  11.  Deep vein thrombosis prophylaxis. The patient is on full dose anticoagulation with heparin.  12.  Gastrointestinal prophylaxis. The patient is on proton pump inhibitor.  13.  Obstructive sleep apnea. We will continue patient on CPAP.   CODE STATUS: The patient to this is a full code.   TOTAL TIME SPENT ON ADMISSION AND  PATIENT CARE: 55 minutes   ____________________________ Albertine Patricia, MD dse:cc D: 06/05/2013 01:57:50 ET T: 06/05/2013 02:18:50 ET JOB#: [id number]  cc: Albertine Patricia, MD, <Dictator> Iann Rodier Graciela Husbands MD ELECTRONICALLY SIGNED 06/15/2013 5:52

## 2015-01-09 NOTE — Discharge Summary (Signed)
PATIENT NAME:  Kimberly Shaffer, Kimberly Shaffer MR#:  [id number] DATE OF BIRTH:  November 28, 1950  DATE OF ADMISSION:  07/21/2013 DATE OF DISCHARGE:  07/22/2013  PRESENTING COMPLAINT: Left upper and lower extremity numbness.   DISCHARGE DIAGNOSES: 1.  Acute left caudate nucleus lacunar infarct.  2.  Hypertension.  3.  Coronary artery disease.  4.  Hyperlipidemia.  5.  Chronic kidney disease stage IV.   CONDITION ON DISCHARGE: Fair.   CODE STATUS: FULL CODE.   DISCHARGE MEDICATIONS: 1.  Folic acid 1 mg p.o. daily.  2.  Atorvastatin 80 mg at bedtime.  3.  Metoprolol ER 100 mg p.o. daily.  4.  Plavix 75 mg daily.  5.  Protonix 40 mg daily.  6.  Garlic oral capsule p.o. daily.  7.  Amitiza 24 mcg p.o. daily.  8.  Oxycodone 10 mg 1 tablet p.o. daily as needed.  9.  Meclizine 25 mg 3 times a day as needed.  10.  Amlodipine 10 mg daily.  11.  Aspirin 81 mg daily.  12.  Cymbalta 60 mg delayed release p.o. daily.  13.  One-A-Day vitamin 50 plus p.o. daily.  14.  Nitroglycerin sublingual as needed.   DISCHARGE FOLLOWUP: With PCP at [facility] in 1 to 2 weeks.   LABORATORY AND DIAGNOSTICS: Lipid profile within normal limits. White count 6.1, H and H 8.9 and 26.8. Creatinine 3.74, potassium 4, sodium 135 and chloride 110.   Ultrasound carotid Doppler shows reversal of flow in the right vertebral artery, atherosclerotic disease in the carotids without hemodynamically significant stenosis.   UA negative for UTI.  Chest x-ray: stable cardiomegaly.   CT of the head shows mild atrophy. There is left caudate head lacunar infarct.   EKG: Normal sinus rhythm with right axis deviation and low QRS voltage.   BRIEF SUMMARY OF HOSPITAL COURSE: [name] is a 64 year old African American female with past medical history of CAD, history of stroke in the remote past and hypertension who comes into the Emergency Room with left-sided numbness and dizziness. She was admitted with:  1.  Acute CVA with left-sided numbness and  dizziness. She was continued on aspirin, Plavix and statin. CT head suggestive of left caudate nucleus infarct. MRI was not done since the patient's symptoms improved. Her echocardiogram was recently done in September 2013 which revealed EF of 55% to 60%. Carotid Doppler did not show any stenosis. The patient tolerated PT well. No PT needs were needed at home.  2.  Chronic renal failure, stage IV. Follow up with nephrology on your scheduled appointment.  3.  Coronary artery disease. Remains stable. Her heart meds were continued.  4.  Type 2 diabetes, on sliding scale insulin.  5.  GERD.  6.  DVT prophylaxis. Heparin was given.   Hospital stay otherwise remained stable. The patient remained a FULL CODE.   TIME SPENT: 40 minutes. ____________________________ Hart Rochester Posey Pronto, MD sap:sb D: 07/22/2013 15:15:16 ET T: 07/22/2013 16:28:07 ET JOB#: [id number]  cc: Leray Garverick A. Posey Pronto, MD, <Dictator> Ilda Basset MD ELECTRONICALLY SIGNED 07/25/2013 13:31
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
## 2015-01-10 NOTE — Discharge Summary (Signed)
PATIENT NAME:  Kimberly Shaffer, Kimberly Shaffer MR#:  [id number] DATE OF BIRTH:  [birth year]  DATE OF ADMISSION:  04/08/2014 DATE OF DISCHARGE:  04/09/2014  For a detailed account please refer to the history and physical done on patient admission by Dr. Margaretmary Eddy.   DIAGNOSES AT DISCHARGE: As follows:  1.  Chest pain secondary to symptomatic anemia.  2.  Symptomatic anemia.  3.  History of coronary disease.  4.  Hypertension.  5.  Hyperlipidemia.  6.  Gastroesophageal reflux disease.   DIET: The patient is being discharged on a low-sodium, low-fat diet.   ACTIVITY: As tolerated.   FOLLOWUP: With Dr. Clayborn Bigness in the next 1-2 weeks.   DISCHARGE MEDICATIONS: Folic acid 1 mg daily, atorvastatin 80 mg daily, metoprolol succinate 50 mg daily, garlic 1 capsule daily, Amitiza 24 mcg 1 capsule daily, meclizine 25 mg t.i.d. as needed, amlodipine 10 mg daily, aspirin 81 mg daily, Cymbalta 60 mg daily, multivitamin daily, sublingual nitroglycerin as needed, albuterol inhaler 2-4 puffs q.4 hours as needed, Tessalon Perles q.i.d. as needed, Protonix 40 mg b.i.d., oxycodone 10 mg q.4-6 hours as needed, Plavix 75 mg daily.   CONSULTANTS DURING THE HOSPITAL COURSE:  Dr. Clayborn Bigness from cardiology.   PERTINENT STUDIES DONE DURING THE HOSPITAL COURSE:  1.  A chest x-ray done on admission showed stable cardiomegaly and chronic interstitial lung disease. No acute cardiopulmonary process.  2.  A 2-dimensional echocardiogram showed ejection fraction of 50-65%, borderline LVH, mildly dilated left atrium, mildly dilated right atrium, mild mitral valve regurgitation, severely increased left ventricular posterior wall thickness, mild tricuspid regurgitation.    HOSPITAL COURSE:  This is a 64 year old female who presented to the hospital with chest pain and was also noted to be anemic.  1.  Chest pain. The most likely cause of the patient's chest pain was related to symptomatic anemia. The patient's hemoglobin was down to as low 7.2. She  had no acute EKG changes concerning for coronary artery disease. She did have a mild troponin elevation, but as per cardiology that was more likely from demand ischemia. The patient was transfused 2 units of packed red blood cells and post transfusion the patient has had no further chest pain and has been hemodynamically stable. The patient was seen by cardiology, who did not think that the patient needed acute cardiac intervention at this point. Echocardiogram showed normal ejection fraction with no wall motion abnormalities. Since patient is presently asymptomatic she is being discharged home on a baby aspirin, Plavix, beta blocker and statin as stated.  2.  Anemia.  The patient apparently is being worked up for a GI bleed as an outpatient. She was scheduled to get a colonoscopy a few weeks back in June but it was canceled as her prep was not very good. She was transfused 2 units of packed red blood cells as her hemoglobin was down to 7, but now it is up to 9. She is clinically asymptomatic, not having any evidence of acute melena or hematochezia. I did recommend that she followup with gastroenterology in [facility] to complete her workup for her anemia and to get a colonoscopy done within the next few weeks.  3.  Acute on chronic renal failure. The patient's baseline creatinine is around 3.3, she presented to the hospital with a creatinine of 4, likely secondary to volume loss and from the worsening anemia. After being transfused her creatinine is now back to baseline.  4.  Hyperlipidemia. The patient was maintained on her Simvastatin. She will  resume that.  5. Hypertension. The patient remained hemodynamically stable. She will continue her Toprol and Norvasc.  6.  Depression. The patient was maintained on her Cymbalta and she will resume that upon discharge.   The patient is a full code.   TIME SPENT: 40 minutes.    ____________________________ Belia Heman. Verdell Carmine, MD vjs:lt D: 04/09/2014 13:52:12  ET T: 04/09/2014 15:36:36 ET JOB#: [id number]  cc: Belia Heman. Verdell Carmine, MD, <Dictator> Dwayne D. Clayborn Bigness, MD Henreitta Leber MD ELECTRONICALLY SIGNED 04/17/2014 14:06

## 2015-01-10 NOTE — H&P (Signed)
PATIENT NAME:  Kimberly Shaffer, Kimberly Shaffer MR#:  [id number] DATE OF BIRTH:  1950-12-26  DATE OF ADMISSION:  04/08/2014  PRIMARY CARE PHYSICIAN: Nonlocal.   PRIMARY CARDIOLOGIST: Dr. Nehemiah Massed.   REFERRING PHYSICIAN: Dr. Beather Arbour.   CHIEF COMPLAINT: Chest pain and shortness of breath.   HISTORY OF PRESENT ILLNESS: The patient is a 65 year old African American female with past medical history of coronary artery disease status post seven stents and multiple other medical problems, is presenting to the ED with a chief complaint of chest pain. The patient is reporting that she has been having intermittent episodes of chest pain for the past two weeks, which has been spontaneously resolving. In fact, the patient was admitted to [facility] to get colonoscopy done for chronic anemia, but as the patient was again anemic, she has received blood transfusions and she was discharged home and rescheduled for colonoscopy. Although the patient was having intermittent episodes of chest pain last night, she woke up from sleep with severe chest pain associated with shortness of breath. She was having nausea but denies any vomiting. She felt dizzy. The patient took two sublingual nitroglycerin which stopped the pain. Subsequently, she called EMS, and they gave her aspirin and nitroglycerin, and the pain was almost resolved. By the time she came into the ED, her chest pain was only 1 out of 10. As the patient's hemoglobin was at 7.0, two units of blood transfusion were ordered by the Emergency Room physician, Dr. Beather Arbour. EKG has revealed some T wave inversions in the lateral leads, which are chronic in nature. The initial troponin is at 0.04. Hospitalist team is called to admit the patient. During my examination, the patient is chest pain-free, and denies any shortness of breath. She denies any other symptoms. No family members at bedside. Resting comfortably.   PAST MEDICAL HISTORY: Coronary artery disease, status post CABG, recent  history of acute myocardial infarction in September 2014, status post seven stents, as reported by the patient, chronic renal insufficiency, stage IV, hypertension, hyperlipidemia, obstructive sleep apnea not on CPAP, depression, hypothyroidism, peripheral vascular disease with two stents in the lower extremities, diabetes mellitus, gastroesophageal reflux disease.   PAST SURGICAL HISTORY: Status post CABG in the year 2012, bilateral knee replacements, status post seven cardiac stent placements, recent cardiac catheterization in September 2014, recent colonoscopy at [facility] approximately two weeks ago.   ALLERGIES: ALLERGIC TO NYSTATIN, SULFA.   PSYCHOSOCIAL HISTORY: Lives at home with two sons. Smokes 1 pack of cigarettes in three days. Denies alcohol or illicit drug use.   FAMILY HISTORY: Hypertension, coronary artery disease and stroke run in her family.   HOME MEDICATIONS: Tessalon Perles 1 tablet p.o. 3 times a day as needed, pantoprazole 40 mg 2 times a day, oxycodone 10 mg 2 tablets a day as tolerated, one-a-day multivitamins once daily, nitroglycerin 0.4 mg sublingually every five minutes as needed for chest pain, metoprolol succinate 100 mg 1/2 tablet p.o. once daily, meclizine 25 mg p.o. 3 times a day, lisinopril 10 mg once daily, folic acid 1 mg 1 tablet p.o. once daily, Cymbalta 60 mg once daily, Plavix 75 mg once daily, atorvastatin 80 mg once daily, aspirin 81 mg once daily, amlodipine 10 mg once daily, Amitiza 24 mcg 1 capsule p.o. once daily, albuterol 2 puffs inhalation every four hours as needed for shortness of breath.   REVIEW OF SYSTEMS: CONSTITUTIONAL: Denies any fever or fatigue, but complaining of shortness of breath associated with chest pain.  EYES: Denies blurry vision, double  vision, cataracts or glaucoma.  ENT: Denies epistaxis, discharge, tinnitus.  RESPIRATION: Denies cough, chronic obstructive pulmonary disease. Has chronic history of obstructive sleep apnea not on CPAP.   CARDIOVASCULAR: Complaining of chest pain associated with shortness of breath. Has positive murmur.  GASTROINTESTINAL: Denies diarrhea. Complaining of nausea and vomiting. Denies any hematemesis, melena. Denies any hematochezia.  GENITOURINARY: No dysuria, hematuria. No urinary frequency.  GYNECOLOGIC AND BREASTS: Denies breast mass or vaginal discharge.  ENDOCRINE: Denies polyuria, nocturia or thyroid problems. Has chronic history of diabetes mellitus.  INTEGUMENTARY: No acne, rash, lesions.  MUSCULOSKELETAL: No joint pain in the neck and back. Denies any history of gout.  NEUROLOGIC: Denies vertigo, ataxia, dysarthria, dysphagia.  PSYCHIATRIC: No ADD, OCD, insomnia.   PHYSICAL EXAMINATION: VITAL SIGNS: Temperature 98.5, pulse 83, respirations 18, blood pressure is 123/56, pulse oximetry 97% on room air.  GENERAL APPEARANCE: Not in acute distress. Moderately built and nourished.  HEENT: Normocephalic, atraumatic. Pupils are equally reacting to light and accommodation. No scleral icterus. No conjunctival injection. No sinus tenderness. No postnasal drip. Moist mucous membranes. NECK: Supple. No JVD. No thyromegaly. Range of motion is intact.  LUNGS: Clear to auscultation bilaterally. No accessory muscle use and no anterior chest wall tenderness on palpation.  CARDIAC: S1, S2 normal. Regular rate and rhythm. Positive ejection systolic murmur. No clicks or gallops.  GASTROINTESTINAL: Bowel sounds are positive in all four quadrants. Nontender, nondistended with no hepatosplenomegaly, no mass.  NEUROLOGIC: Awake, alert, oriented x3. Cranial nerves II through XII are grossly intact. Motor and sensory are intact. Reflexes are 2+.  EXTREMITIES: No edema. No cyanosis. No clubbing.  SKIN: Warm to touch. Normal turgor. No rashes. No lesions.  MUSCULOSKELETAL: No joint effusion, tenderness, erythema.  PSYCHIATRIC: Normal mood and affect.   LABORATORY AND IMAGING STUDIES: A 12-lead EKG: Normal sinus  rhythm at 85 beats per minute with frequent premature ventricular contractions. T wave inversions are noted in the lateral leads, which is unchanged from the previous EKG. The patient's glucose is at 127. BNP 3122. BUN 42, creatinine 4.05. Her baseline seemed to be at 3.3. Sodium, potassium are normal. Chloride 119, CO2 19, anion gap is at 7. Serum osmolality and calcium are normal. Troponin 0.07. WBC is 7.9, hemoglobin 7.1, which was at 8.1 on January 10, hematocrit 23.5, platelets are 189, MCV 84. Chest x-ray: No acute findings.   ASSESSMENT AND PLAN: A 64 year old African American female with intermittent episodes of chest pain for the past two weeks, but last night she woke up with severe chest pain associated with shortness of breath at around 10:00 p.m. and came into the Emergency Department.  1.  Chest pain with history of coronary artery disease, status post coronary artery bypass grafting, status post seven stents in the past. We will rule out acute myocardial infarction. We will admit her to telemetry, cycle cardiac biomarkers and implement acute coronary syndrome protocol. Cardiology consult is placed to Dr. Nehemiah Massed. We will keep her on nothing by mouth except for medications and provide IV fluids after blood transfusion.  2.  Symptomatic anemia. No acute hematemesis or melena or hematochezia is complained of by the patient. The patient is actually scheduled to get colonoscopy at [facility]. We will provide 2 units of blood transfusion and check stool for Hemoccult.  3.  Acute on chronic renal insufficiency. We will provide IV fluids after blood transfusion and monitor renal function closely. We will hold off nephrotoxic medications, and if renal function is not getting better, we  will consider nephrology consult.  4.  Diabetes mellitus. We will put her on sliding scale insulin as the patient is on nothing by mouth except medications.  5.  Coronary artery disease. Resume her home medications.   6.  Gastroesophageal reflux disease. Provide gastrointestinal prophylaxis.  7.  Nicotine dependence. Smokes one pack in three days. After acute myocardial infarction is ruled out, we will consider nicotine patch. We will provide nicotine cessation counseling.   CODE STATUS: She is full code. Son is the medical power of attorney. Plan of care was discussed in detail with the patient. She is aware of plan.   TOTAL TIME SPENT ON ADMISSION: 50 minutes.    ____________________________ Nicholes Mango, MD ag:cg D: 04/08/2014 01:16:14 ET T: 04/08/2014 01:45:55 ET JOB#: [id number]  cc: Nicholes Mango, MD, <Dictator> Corey Skains, MD Nicholes Mango MD ELECTRONICALLY SIGNED 04/09/2014 2:03

## 2015-01-10 NOTE — Consult Note (Signed)
PATIENT NAME:  Kimberly Shaffer, BOSLER MR#:  [id number] DATE OF BIRTH:  March 22, 1951  DATE OF CONSULTATION:  04/08/2014  CONSULTING PHYSICIAN:  Mordecai Tindol D. Clayborn Bigness, MD  CARDIOLOGIST:  Dr. Nehemiah Massed.   REFERRING PHYSICIAN:  Dr. Margaretmary Eddy.   INDICATION: Chest pain, shortness of breath.   HISTORY OF PRESENT ILLNESS: The patient is a 64 year old African American female with past history of coronary disease status post 7 stents, multiple other medical procedures, who presented to the ER with chest pain. The patient reported having intermittent symptoms over the last 2-3 weeks resolving spontaneously. In fact, the patient was admitted at [facility], for colonoscopy for chronic anemia. The patient was anemic, received blood, discharged home and rescheduled for colonoscopy. The patient was having intermittent episodes of chest pain the night prior to admission, which woke her up.  Having shortness of breath, nausea, no vomiting, felt dizzy. The patient took 2 sublingual nitroglycerin tablets which stopped the pain. Subsequently, she called EMS who gave her aspirin, nitroglycerin and pain resolved.  By the time she came to the ER chest pain was only 1/10, but her hemoglobin was 7. She was scheduled for transfusion of 2 units. EKG had nonspecific T-wave changes. Initial troponin was 0.4 and she was admitted for further evaluation and care.   PAST MEDICAL HISTORY: Coronary disease, coronary bypass, myocardial infarction, renal insufficiency, hypertension, hyperlipidemia, obstructive sleep apnea, depression, hypothyroidism, peripheral vascular disease, anemia, diabetes, reflux.   PAST SURGICAL HISTORY: Coronary artery bypass surgery, bilateral knee surgery, PCI and stents on multiple occasions, colonoscopy was scheduled but reportedly not done.   ALLERGIES: NYSTATIN, SULFA.   SOCIAL HISTORY: Lives at home with her son, smokes 1 pack every 3 days. Denies alcohol consumption.   FAMILY HISTORY: Hypertension, coronary artery  disease, stroke.   MEDICATIONS: Reportedly she is on Tessalon Perles 3 times a day, Protonix 40 mg twice a day, oxycodone 10 mg 2 tablets a day, nitroglycerin p.r.n., metoprolol 100 mg 0.5 tablet once a day, meclizine 25 mg 3 times a day p.r.n., lisinopril 10 mg once a day, folic acid 1 mg once a day, Cymbalta 60 mg once a day, Plavix 75 mg once a day, atorvastatin 80 mg once a day, aspirin 81 mg a day, amlodipine 10 mg once a day, Amitiza 25 mcg 1 capsule once a day, albuterol inhaler 2 puffs every 4 hours as needed.   REVIEW OF SYSTEMS: Denies blackout spells or syncope. No nausea or vomiting. Denies fever, denies chills, no sweats. No weight loss, no weight gain, no hemoptysis, hematemesis. Denies bright red blood per rectum. No vision change, no hearing change. Denies sputum production or cough. She has had anemia of unclear etiology.   PHYSICAL EXAMINATION:  VITAL SIGNS: Blood pressure 122/56, pulse 83, respiratory rate 18, afebrile.  HEENT: Normocephalic, atraumatic. Pupils equal and reactive to light.  NECK: Supple. No significant JVD, bruits or adenopathy.  LUNGS: Clear to auscultation and percussion. No significant wheeze, rhonchi, or rale.  HEART: Regular rate and rhythm.  ABDOMEN: Benign.  EXTREMITIES: Within normal limits.  NEUROLOGIC: Intact.  SKIN: Normal.   LABORATORY DATA: Glucose 127, BNP 3122, BUN 42, creatinine 4.05, baseline creatinine is 3.3, sodium and potassium were normal, chloride 119, CO2 19, osmolality and calcium were normal. Troponin 0.07. White count 7.9, hemoglobin 7.1, MCV 84.   Chest x-ray negative.   EKG: Normal sinus rhythm, rate of 85, nonspecific ST-T wave changes.   ASSESSMENT: Severe anemia, unstable angina, chest pain, renal insufficiency, diabetes, coronary artery disease, hypertension,  reflux, nicotine dependence.   PLAN:  Agree with admission.  1.  Rule out for myocardial infarction. Continue to follow up EKG, continue to follow enzymes. Continue  telemetry. Continue current medications for anginal symptoms and chest pain. If she rules out would continue to treat the patient medically for now.  2.  Severe anemia. Continue transfusion. Would recommend further GI workup for source of blood loss. I am worried that her angina may be related to profound anemia.  3.  Recommend that the patient refrain from smoking.  4.  Renal insufficiency.  Appears to be acute on chronic. Recommend nephrology input, mild hydration. Stay away from nephrotoxic drugs. Base further recommendation on nephrology. 5.  Diabetes, continue sliding scale therapy. Hemoglobin A1c and fasting sugars as per primary. Continue current medications.  6.  Coronary artery disease. Resume home medications. She has had some angina. Will continue current therapy in the meantime.  7.  Chronic obstructive pulmonary disease. Consider inhalers for now. We will treat the patient medically. Do not recommend cardiac catheterization.  Would further evaluate her anemia symptoms with GI, possible scope, before proceeding with any further workup.    ____________________________ Loran Senters. Clayborn Bigness, MD ddc:lt D: 04/09/2014 08:49:40 ET T: 04/09/2014 09:09:53 ET JOB#: [id number]  cc: Shritha Bresee D. Clayborn Bigness, MD, <Dictator> Yolonda Kida MD ELECTRONICALLY SIGNED 05/05/2014 10:22

## 2015-01-11 NOTE — Consult Note (Signed)
PATIENT NAME:  Kimberly Shaffer, Kimberly Shaffer MR#:  [id number] DATE OF BIRTH:  1951/02/07  DATE OF CONSULTATION:  08/13/2011  REFERRING PHYSICIAN:  Dr. Jasmine December  CONSULTING PHYSICIAN:  Dwayne D. Clayborn Bigness, MD  CARDIOLOGIST: Dr. Clair Gulling  in [place]  INDICATIONS: Angina, coronary artery disease.  HISTORY OF PRESENT ILLNESS: [name] is a 64 year old African American female with obesity and multiple medical problems including hypertension, hyperlipidemia, coronary artery disease, peripheral vascular disease, diabetes, obstructive sleep apnea, smoking, previous angioplasty and stenting who presented with recurrent anginal chest pain symptoms. Last catheterization and stent was maybe 10 years ago. She has had recent recurrent chest pain symptoms, midsternal heaviness, numbness in her left arm and neck and jaw which got progressively worse so she finally came to Emergency Room for evaluation. Her peak troponin was 1.0 so she finally presented and after further evaluation was advised to be admitted for further evaluation and care. She has had elevated blood pressures as well 170s and persistent symptoms of chest pain at rest.   REVIEW OF SYSTEMS: No blackout spells, syncope. No nausea. No vomiting. Denies fever, chills, or sweats. No weight loss. No weight gain. No hemoptysis, hematemesis. Denies bright red blood per rectum.   PAST MEDICAL HISTORY:  1. Hypertension. 2. Hyperlipidemia. 3. Coronary artery disease. 4. Peripheral vascular disease. 5. Diabetes. 6. Obstructive sleep apnea.  7. Obesity.  8. Smoking.   PAST SURGICAL HISTORY: Angioplasty and stenting on multiple occasions.   FAMILY HISTORY: Coronary artery disease, hypertension and stroke.   SOCIAL HISTORY: Smoker. Denies alcohol consumption. Lives with her family.   MEDICATIONS:  1. Plavix 75 a day.  2. Metoprolol 150 extended release daily.  3. Lipitor 80 a day.  4. Lasix 40 mg a day.  5. Aspirin 81 mg a day.  6. Cymbalta 60 a day.   7. Nortriptyline 30 mg a day.  8. Lipitor 80 a day.  9. Imdur 60 twice a day. 10. Omeprazole 20 a day.  11. Sertraline. 12. Glimepiride 4 mg a day.   ALLERGIES: Niaspan and sulfa.   PHYSICAL EXAMINATION:  VITAL SIGNS: Blood pressure systolic about Q000111Q, respiratory rate 14, pulse of about 100.   HEENT: Normocephalic, atraumatic. Pupils equal, reactive to light.   NECK: Supple. No jugular venous distention, bruits, adenopathy.   LUNGS: Clear to auscultation and percussion. Mild rhonchi. No wheezing or rales.   HEART: Regular rate, rhythm. Positive S4. Soft systolic ejection murmur at the apex.   ABDOMEN: Benign.   EXTREMITIES: Within normal limits.   NEUROLOGIC: Examination is intact.   SKIN: Normal.   LABORATORY, RADIOLOGICAL AND DIAGNOSTIC DATA: BNP 665, glucose 139, BUN 40, creatinine 2.28. LFTs normal. CK 113, troponin 0.03, hemoglobin 10, hematocrit 30.9, d-dimer 0.92. Urinalysis 3+ bacteria. CT of the head unremarkable. Ultrasound of the lower extremities: No evidence of deep vein thrombosis. EKG: Sinus tachycardia, nonspecific ST-T wave changes with diffuse ST depression inferolateral.   ASSESSMENT:  1. Unstable angina.  2. Angina.  3. Coronary artery disease.  4. Hypertension.  5. Hyperlipidemia.  6. Obesity.  7. Smoking.  8. Obstructive sleep apnea.  9. Chronic renal insufficiency. 10. Diabetes. 11. Peripheral vascular disease.   PLAN: Agree with admit. Rule out for myocardial infarction. Follow up cardiac enzymes. Follow-up EKG. Continue blood pressure control. Continue telemetry. Continue beta blocker therapy. Renal insufficiency should be followed. Would consult nephrology. Echocardiogram should be helpful for LV function and wall motion. Continue diabetes management. Anemia should also be evaluated with a hemoglobin of 10. Advised  the patient to quit smoking. Recommend weight loss and exercise if possible. Continue Imdur for angina. Will probably consider  cardiac catheterization because I am worried that her symptoms are more anginal with known coronary disease and abnormal EKG and progressive symptoms. Will place on renal protection in the interim and base further evaluation on results of catheterization.  ____________________________ Loran Senters Clayborn Bigness, MD ddc:cms D: 08/15/2011 09:26:18 ET T: 08/15/2011 09:55:37 ET JOB#: [id number]  cc: Dwayne D. Clayborn Bigness, MD, <Dictator> Yolonda Kida MD ELECTRONICALLY SIGNED 09/23/2011 14:00

## 2015-01-20 ENCOUNTER — Encounter: Payer: Self-pay | Admitting: Pain Medicine

## 2015-01-20 ENCOUNTER — Ambulatory Visit: Payer: Medicare PPO | Attending: Pain Medicine | Admitting: Pain Medicine

## 2015-01-20 VITALS — BP 144/65 | HR 64 | Temp 97.7°F | Resp 16 | Ht 66.0 in | Wt 238.0 lb

## 2015-01-20 DIAGNOSIS — M170 Bilateral primary osteoarthritis of knee: Secondary | ICD-10-CM

## 2015-01-20 DIAGNOSIS — M19011 Primary osteoarthritis, right shoulder: Secondary | ICD-10-CM

## 2015-01-20 DIAGNOSIS — M25561 Pain in right knee: Secondary | ICD-10-CM | POA: Diagnosis not present

## 2015-01-20 DIAGNOSIS — M25562 Pain in left knee: Secondary | ICD-10-CM | POA: Diagnosis not present

## 2015-01-20 DIAGNOSIS — M19019 Primary osteoarthritis, unspecified shoulder: Secondary | ICD-10-CM | POA: Insufficient documentation

## 2015-01-20 DIAGNOSIS — M1990 Unspecified osteoarthritis, unspecified site: Secondary | ICD-10-CM | POA: Insufficient documentation

## 2015-01-20 DIAGNOSIS — M5137 Other intervertebral disc degeneration, lumbosacral region: Secondary | ICD-10-CM

## 2015-01-20 DIAGNOSIS — M1710 Unilateral primary osteoarthritis, unspecified knee: Secondary | ICD-10-CM | POA: Insufficient documentation

## 2015-01-20 DIAGNOSIS — M179 Osteoarthritis of knee, unspecified: Secondary | ICD-10-CM | POA: Insufficient documentation

## 2015-01-20 DIAGNOSIS — M25512 Pain in left shoulder: Secondary | ICD-10-CM | POA: Diagnosis present

## 2015-01-20 DIAGNOSIS — M19012 Primary osteoarthritis, left shoulder: Secondary | ICD-10-CM

## 2015-01-20 DIAGNOSIS — M25511 Pain in right shoulder: Secondary | ICD-10-CM | POA: Diagnosis present

## 2015-01-20 MED ORDER — OXYCODONE HCL 10 MG PO TABS: ORAL_TABLET | ORAL | Status: DC

## 2015-01-20 NOTE — Patient Instructions (Addendum)
Pt reminded to f/u with pcp regarding bp and general condition  Oxycodone script given to patient teachback 3 done Discharged ambulatory at 1452

## 2015-01-20 NOTE — Progress Notes (Signed)
   Subjective:    Patient ID: Kimberly Shaffer, female    DOB: [birth year], 64 y.o.   MRN: [id number]  HPI Patient is a 64 year old female returns to pain management Center for follow-up evaluation and treatment of pain involving shoulders and hips knees and lower back and lower extremity regions at the present time patient continues evaluation of her general medical condition and gastroenterological condition pulmonary condition at [facility]. We've discussed patient's condition and informed patient that we will avoid interventional treatment while patient continues to undergo evaluation of her condition at [facility]. We will continue noninterventional treatment and will continue patient's oxycodone as discussed with patient at this time. Patient denies any significant change in her condition any significant trauma and states that her condition is fairly stable at this time.   Review of Systems     Objective:   Physical Exam  Physical examination revealed tenderness to palpation in the paraspinal musculature region of the cervical region cervical facet region was tenderness of the splenius capitis and a separate talus musculature region there was decreased range of motion of the left shoulder and right shoulders with difficulty performing the drop test there was tenderness of the acromioclavicular and glenohumeral joint regions with decreased grip strength noted as well operational thoracic facet region was without crepitus of the thoracic region without excessive tends to palpation of the spinous processes noted. There was tenderness over the lumbar paraspinal must reason lumbar facet region of moderate to moderately severe degree with tenderness of the PSIS and PIS regions as well. Well-healed surgical scars of the knees were noted without increased warmth or erythema of the knees noted with moderate tenderness to palpation of the knees. There was decreased EHL  strength without sensory deficits of dermatomal distribution detected. Abdomen soft without tenderness to palpation no costovertebral angle tenderness noted.      Assessment & Plan:  Assessment patient with pain of the shoulder due to degenerative joint disease of the shoulder patient with pain of the knees status post total knee replacement may benefit from geniculate nerve blocks of the knees Willl avoid interventional treatment at this time while patient continues to undergo treatment of her general medical condition. Patient with multilevel degenerative changes of the lumbar spine with lumbar lower extremity pain with component of facet syndrome and neurogenic claudication. We'll avoid interventional treatment while patient undergoes general medical evaluation  Plan #1 continue oxycodone Plan #2 follow-up primary care physician [facility] and continue evaluation of general medical condition Plan #3 we'll avoid interventional treatment at this time while patient undergoes evaluation of general medical condition Plan #4 surgical reevaluation of knees as discussed with patient Plan #5 neurosurgical evaluation of the cervical and lumbar regions to be considered as discussed Plan #6 and a consider radio Brixey rhizolysis and intraspinal implantation as previously mentioned will avoid considering such treatment at this time while patient undergoes evaluation of general medical condition Patient advised to call pain management should they be change in condition prior to scheduled return appointment

## 2015-01-21 NOTE — Addendum Note (Signed)
Addended by: Morley Kos on: 01/21/2015 04:11 PM   Modules accepted: Level of Service

## 2015-01-29 NOTE — Addendum Note (Signed)
Addended by: Dewayne Shorter on: 01/29/2015 10:51 AM   Modules accepted: Orders

## 2015-02-11 ENCOUNTER — Other Ambulatory Visit: Payer: Self-pay | Admitting: Pain Medicine

## 2015-02-16 ENCOUNTER — Other Ambulatory Visit: Payer: Self-pay | Admitting: Pain Medicine

## 2015-02-16 DIAGNOSIS — M47817 Spondylosis without myelopathy or radiculopathy, lumbosacral region: Secondary | ICD-10-CM | POA: Insufficient documentation

## 2015-02-16 DIAGNOSIS — M7060 Trochanteric bursitis, unspecified hip: Secondary | ICD-10-CM | POA: Insufficient documentation

## 2015-02-18 ENCOUNTER — Encounter: Payer: Self-pay | Admitting: Pain Medicine

## 2015-02-18 ENCOUNTER — Ambulatory Visit: Payer: Medicare PPO | Attending: Pain Medicine | Admitting: Pain Medicine

## 2015-02-18 VITALS — BP 154/82 | HR 52 | Temp 98.2°F | Resp 20 | Ht 66.0 in | Wt 231.0 lb

## 2015-02-18 DIAGNOSIS — M461 Sacroiliitis, not elsewhere classified: Secondary | ICD-10-CM | POA: Diagnosis not present

## 2015-02-18 DIAGNOSIS — M150 Primary generalized (osteo)arthritis: Secondary | ICD-10-CM

## 2015-02-18 DIAGNOSIS — M19012 Primary osteoarthritis, left shoulder: Secondary | ICD-10-CM | POA: Insufficient documentation

## 2015-02-18 DIAGNOSIS — M19011 Primary osteoarthritis, right shoulder: Secondary | ICD-10-CM | POA: Diagnosis not present

## 2015-02-18 DIAGNOSIS — M5136 Other intervertebral disc degeneration, lumbar region: Secondary | ICD-10-CM | POA: Insufficient documentation

## 2015-02-18 DIAGNOSIS — M5126 Other intervertebral disc displacement, lumbar region: Secondary | ICD-10-CM | POA: Diagnosis not present

## 2015-02-18 DIAGNOSIS — Z96659 Presence of unspecified artificial knee joint: Secondary | ICD-10-CM | POA: Insufficient documentation

## 2015-02-18 DIAGNOSIS — M170 Bilateral primary osteoarthritis of knee: Secondary | ICD-10-CM | POA: Diagnosis not present

## 2015-02-18 DIAGNOSIS — M542 Cervicalgia: Secondary | ICD-10-CM | POA: Diagnosis present

## 2015-02-18 DIAGNOSIS — M160 Bilateral primary osteoarthritis of hip: Secondary | ICD-10-CM | POA: Diagnosis not present

## 2015-02-18 DIAGNOSIS — M546 Pain in thoracic spine: Secondary | ICD-10-CM | POA: Diagnosis present

## 2015-02-18 DIAGNOSIS — M1288 Other specific arthropathies, not elsewhere classified, other specified site: Secondary | ICD-10-CM | POA: Diagnosis not present

## 2015-02-18 DIAGNOSIS — M47817 Spondylosis without myelopathy or radiculopathy, lumbosacral region: Secondary | ICD-10-CM

## 2015-02-18 DIAGNOSIS — M159 Polyosteoarthritis, unspecified: Secondary | ICD-10-CM

## 2015-02-18 DIAGNOSIS — M533 Sacrococcygeal disorders, not elsewhere classified: Secondary | ICD-10-CM

## 2015-02-18 DIAGNOSIS — M5137 Other intervertebral disc degeneration, lumbosacral region: Secondary | ICD-10-CM

## 2015-02-18 DIAGNOSIS — M7060 Trochanteric bursitis, unspecified hip: Secondary | ICD-10-CM

## 2015-02-18 MED ORDER — OXYCODONE HCL 10 MG PO TABS: ORAL_TABLET | ORAL | Status: DC

## 2015-02-18 NOTE — Progress Notes (Signed)
   Subjective:    Patient ID: Kimberly Shaffer, female    DOB: [birth year], 64 y.o.   MRN: [id number]  HPI    Review of Systems     Objective:   Physical Exam        Assessment & Plan:

## 2015-02-18 NOTE — Progress Notes (Signed)
   Subjective:    Patient ID: Kimberly Shaffer, female    DOB: 1951-05-21, 64 y.o.   MRN: [id number]  HPI  Patient is 64 year old female returns to [place] for further evaluation and treatment of pain involving the neck and entire back upper and lower extremity regions. Recently was seen by her primary care physician and discussed the pain involving the region of the hip and buttocks region. The patient's primary care physician informed patient to follow-up in pain management to consider having an injection. We discussed patient's condition and after evaluation of patient we will proceed with sacroiliac joint injection at time of return appointment. Response to the sacroiliac joint injection we may consider injection of the hip well as other procedures. The patient states the pain is severely incapacitating and that she is in hopes of being able to undergo procedure for treatment of the severely disabling pain interferes with activities of daily living as well as awakens patient from sleep.   Review of Systems     Objective:   Physical Exam There was tenderness over the splenius capitis and occipitalis musculature region. Palpation of the cervical facet cervical paraspinal muscles were returns to palpation of mild to moderate degree. There was mild tenderness of the acromioclavicular glenohumeral joint region with what appeared to be unremarkable Spurling's maneuver Tinel's and Phalen's maneuver were without increased pain of significant degree.. Palpation of the thoracic facet thoracic paraspinal musculature region was associated with mild to moderate discomfort with no crepitus of the thoracic region noted. Palpation of the lumbar paraspinal musculature region lumbar facet region was associated with increased pain of moderate C severe discomfort.. There was severe tenderness to palpation noted over the PSIS and PII S regions. There was moderate tenderness of the greater trochanteric  region and iliotibial band region as well. Patrick's maneuver was with increased pain of moderate degree. 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  Will consider sacroiliac joint injection at time of return appointment pending medical clearance by primary care physician  F/U PCP for evaliation of  BP and general medical  condition. Patient scheduled follow-up with primary care physician for evaluation of blood pressure pulse and general medical condition as well as for medical clearance for steroid injection consisting of sacroiliac joint injection  F/U surgical evaluation.  F/U neurological evaluation.  May consider radiofrequency rhizolysis or intraspinal procedures pending response to present treatment and F/U evaluation.  Patient to call [facility] Management Center should patient have concerns prior to scheduled return appointment.

## 2015-02-18 NOTE — Patient Instructions (Addendum)
Smoking Cessation Quitting smoking is important to your health and has many advantages. However, it is not always easy to quit since nicotine is a very addictive drug. Oftentimes, people try 3 times or more before being able to quit. This document explains the best ways for you to prepare to quit smoking. Quitting takes hard work and a lot of effort, but you can do it. ADVANTAGES OF QUITTING SMOKING 1. You will live longer, feel better, and live better. 2. Your body will feel the impact of quitting smoking almost immediately. 1. Within 20 minutes, blood pressure decreases. Your pulse returns to its normal level. 2. After 8 hours, carbon monoxide levels in the blood return to normal. Your oxygen level increases. 3. After 24 hours, the chance of having a heart attack starts to decrease. Your breath, hair, and body stop smelling like smoke. 4. After 48 hours, damaged nerve endings begin to recover. Your sense of taste and smell improve. 5. After 72 hours, the body is virtually free of nicotine. Your bronchial tubes relax and breathing becomes easier. 6. After 2 to 12 weeks, lungs can hold more air. Exercise becomes easier and circulation improves. 3. The risk of having a heart attack, stroke, cancer, or lung disease is greatly reduced. 1. After 1 year, the risk of coronary heart disease is cut in half. 2. After 5 years, the risk of stroke falls to the same as a nonsmoker. 3. After 10 years, the risk of lung cancer is cut in half and the risk of other cancers decreases significantly. 4. After 15 years, the risk of coronary heart disease drops, usually to the level of a nonsmoker. 4. If you are pregnant, quitting smoking will improve your chances of having a healthy baby. 5. The people you live with, especially any children, will be healthier. 6. You will have extra money to spend on things other than cigarettes. QUESTIONS TO THINK ABOUT BEFORE ATTEMPTING TO QUIT You may want to talk about your  answers with your health care provider. 1. Why do you want to quit? 2. If you tried to quit in the past, what helped and what did not? 3. What will be the most difficult situations for you after you quit? How will you plan to handle them? 4. Who can help you through the tough times? Your family? Friends? A health care provider? 5. What pleasures do you get from smoking? What ways can you still get pleasure if you quit? Here are some questions to ask your health care provider: 1. How can you help me to be successful at quitting? 2. What medicine do you think would be best for me and how should I take it? 3. What should I do if I need more help? 4. What is smoking withdrawal like? How can I get information on withdrawal? GET READY 1. Set a quit date. 2. Change your environment by getting rid of all cigarettes, ashtrays, matches, and lighters in your home, car, or work. Do not let people smoke in your home. 3. Review your past attempts to quit. Think about what worked and what did not. GET SUPPORT AND ENCOURAGEMENT You have a better chance of being successful if you have help. You can get support in many ways.  Tell your family, friends, and coworkers that you are going to quit and need their support. Ask them not to smoke around you.  Get individual, group, or telephone counseling and support. Programs are available at [employer] and health centers. Call  your local health department for information about programs in your area.  Spiritual beliefs and practices may help some smokers quit.  Download a "quit meter" on your computer to keep track of quit statistics, such as how long you have gone without smoking, cigarettes not smoked, and money saved.  Get a self-help book about quitting smoking and staying off tobacco. [place] yourself from urges to smoke. Talk to someone, go for a walk, or occupy your time with a task.  Change your normal routine. Take  a different route to work. Drink tea instead of coffee. Eat breakfast in a different place.  Reduce your stress. Take a hot bath, exercise, or read a book.  Plan something enjoyable to do every day. Reward yourself for not smoking.  Explore interactive web-based programs that specialize in helping you quit. GET MEDICINE AND USE IT CORRECTLY Medicines can help you stop smoking and decrease the urge to smoke. Combining medicine with the above behavioral methods and support can greatly increase your chances of successfully quitting smoking.  Nicotine replacement therapy helps deliver nicotine to your body without the negative effects and risks of smoking. Nicotine replacement therapy includes nicotine gum, lozenges, inhalers, nasal sprays, and skin patches. Some may be available over-the-counter and others require a prescription.  Antidepressant medicine helps people abstain from smoking, but how this works is unknown. This medicine is available by prescription.  Nicotinic receptor partial agonist medicine simulates the effect of nicotine in your brain. This medicine is available by prescription. Ask your health care provider for advice about which medicines to use and how to use them based on your health history. Your health care provider will tell you what side effects to look out for if you choose to be on a medicine or therapy. Carefully read the information on the package. Do not use any other product containing nicotine while using a nicotine replacement product.  RELAPSE OR DIFFICULT SITUATIONS Most relapses occur within the first 3 months after quitting. Do not be discouraged if you start smoking again. Remember, most people try several times before finally quitting. You may have symptoms of withdrawal because your body is used to nicotine. You may crave cigarettes, be irritable, feel very hungry, cough often, get headaches, or have difficulty concentrating. The withdrawal symptoms are only  temporary. They are strongest when you first quit, but they will go away within 10-14 days. To reduce the chances of relapse, try to:  Avoid drinking alcohol. Drinking lowers your chances of successfully quitting.  Reduce the amount of caffeine you consume. Once you quit smoking, the amount of caffeine in your body increases and can give you symptoms, such as a rapid heartbeat, sweating, and anxiety.  Avoid smokers because they can make you want to smoke.  Do not let weight gain distract you. Many smokers will gain weight when they quit, usually less than 10 pounds. Eat a healthy diet and stay active. You can always lose the weight gained after you quit.  Find ways to improve your mood other than smoking. FOR MORE INFORMATION  www.smokefree.gov  Document Released: 08/30/2001 Document Revised: 01/20/2014 Document Reviewed: 12/15/2011 [facility] Patient Information 2015 [place], [place]. This information is not intended to replace advice given to you by your health care provider. Make sure you discuss any questions you have with your health care provider. Continue present medications.  F/U PCP for evaliation of  BP and general medical  Condition.  Sacroiliac joint injection to be  performed at time of return appointment pending medical clearance by primary care physician  F/U surgical evaluation. We will schedule surgical evaluation of the hip.  F/U neurological evaluation.  May consider radiofrequency rhizolysis or intraspinal procedures pending response to present treatment and F/U evaluation.  Patient to call Pain Management Center should patient have concerns prior to scheduled return appointment. Sacroiliac (SI) Joint Injection Patient Information  Description: The sacroiliac joint connects the scrum (very low back and tailbone) to the ilium (a pelvic bone which also forms half of the hip joint).  Normally this joint experiences very little motion.  When this joint becomes inflamed or  unstable low back and or hip and pelvis pain may result.  Injection of this joint with local anesthetics (numbing medicines) and steroids can provide diagnostic information and reduce pain.  This injection is performed with the aid of x-ray guidance into the tailbone area while you are lying on your stomach.   You may experience an electrical sensation down the leg while this is being done.  You may also experience numbness.  We also may ask if we are reproducing your normal pain during the injection.  Conditions which may be treated SI injection:   Low back, buttock, hip or leg pain  Preparation for the Injection:  6. Do not eat any solid food or dairy products within 6 hours of your appointment.  7. You may drink clear liquids up to 2 hours before appointment.  Clear liquids include water, black coffee, juice or soda.  No milk or cream please. 8. You may take your regular medications, including pain medications with a sip of water before your appointment.  Diabetics should hold regular insulin (if take separately) and take 1/2 normal NPH dose the morning of the procedure.  Carry some sugar containing items with you to your appointment. 9. A driver must accompany you and be prepared to drive you home after your procedure. 10. Bring all of your current medications with you. 11. An IV may be inserted and sedation may be given at the discretion of the physician. 12. A blood pressure cuff, EKG and other monitors will often be applied during the procedure.  Some patients may need to have extra oxygen administered for a short period.  41. You will be asked to provide medical information, including your allergies, prior to the procedure.  We must know immediately if you are taking blood thinners (like Coumadin/Warfarin) or if you are allergic to IV iodine contrast (dye).  We must know if you could possible be pregnant.  Possible side effects:   Bleeding from needle site  Infection (rare, may  require surgery)  Nerve injury (rare)  Numbness & tingling (temporary)  A brief convulsion or seizure  Light-headedness (temporary)  Pain at injection site (several days)  Decreased blood pressure (temporary)  Weakness in the leg (temporary)   Call if you experience:   New onset weakness or numbness of an extremity below the injection site that last more than 8 hours.  Hives or difficulty breathing ( go to the emergency room)  Inflammation or drainage at the injection site  Any new symptoms which are concerning to you  Please note:  Although the local anesthetic injected can often make your back/ hip/ buttock/ leg feel good for several hours after the injections, the pain will likely return.  It takes 3-7 days for steroids to work in the sacroiliac area.  You may not notice any pain relief for at least that  one week.  If effective, we will often do a series of three injections spaced 3-6 weeks apart to maximally decrease your pain.  After the initial series, we generally will wait some months before a repeat injection of the same type.  If you have any questions, please call [phone] [facility]

## 2015-02-18 NOTE — Progress Notes (Signed)
Safety precautions to be maintained throughout the outpatient stay will include: orient to surroundings, keep bed in low position, maintain call bell within reach at all times, provide assistance with transfer out of bed and ambulation.  

## 2015-02-18 NOTE — Progress Notes (Signed)
Safety precautions to be maintained throughout the outpatient stay will include: orient to surroundings, keep bed in low position, maintain call bell within reach at all times, provide assistance with transfer out of bed and ambulation.  Discharged at 1335, ambulatory.

## 2015-03-09 ENCOUNTER — Telehealth: Payer: Self-pay

## 2015-03-09 ENCOUNTER — Ambulatory Visit: Payer: Medicare PPO | Admitting: Pain Medicine

## 2015-03-09 NOTE — Telephone Encounter (Signed)
Pt left vmail on office phone6-20-16 at 6:47/ had another death in family and also having kidney problems had to go to [facility] to kidney phys this morning will need to resched procedure

## 2015-03-09 NOTE — Telephone Encounter (Signed)
Patient to call and reschedule when she is released at [facility].

## 2015-03-18 DIAGNOSIS — F4321 Adjustment disorder with depressed mood: Secondary | ICD-10-CM | POA: Insufficient documentation

## 2015-03-19 ENCOUNTER — Ambulatory Visit: Payer: Medicare PPO | Attending: Pain Medicine | Admitting: Pain Medicine

## 2015-03-19 ENCOUNTER — Encounter: Payer: Self-pay | Admitting: Pain Medicine

## 2015-03-19 VITALS — BP 145/76 | HR 74 | Temp 98.0°F | Resp 18 | Ht 66.0 in | Wt 240.0 lb

## 2015-03-19 DIAGNOSIS — M5137 Other intervertebral disc degeneration, lumbosacral region: Secondary | ICD-10-CM

## 2015-03-19 DIAGNOSIS — M5136 Other intervertebral disc degeneration, lumbar region: Secondary | ICD-10-CM | POA: Insufficient documentation

## 2015-03-19 DIAGNOSIS — M170 Bilateral primary osteoarthritis of knee: Secondary | ICD-10-CM | POA: Diagnosis not present

## 2015-03-19 DIAGNOSIS — Z96619 Presence of unspecified artificial shoulder joint: Secondary | ICD-10-CM | POA: Insufficient documentation

## 2015-03-19 DIAGNOSIS — M47817 Spondylosis without myelopathy or radiculopathy, lumbosacral region: Secondary | ICD-10-CM

## 2015-03-19 DIAGNOSIS — M533 Sacrococcygeal disorders, not elsewhere classified: Secondary | ICD-10-CM | POA: Insufficient documentation

## 2015-03-19 DIAGNOSIS — M1288 Other specific arthropathies, not elsewhere classified, other specified site: Secondary | ICD-10-CM | POA: Diagnosis not present

## 2015-03-19 DIAGNOSIS — M7060 Trochanteric bursitis, unspecified hip: Secondary | ICD-10-CM

## 2015-03-19 DIAGNOSIS — M5126 Other intervertebral disc displacement, lumbar region: Secondary | ICD-10-CM | POA: Diagnosis not present

## 2015-03-19 DIAGNOSIS — M545 Low back pain: Secondary | ICD-10-CM | POA: Diagnosis present

## 2015-03-19 DIAGNOSIS — M79604 Pain in right leg: Secondary | ICD-10-CM | POA: Diagnosis present

## 2015-03-19 DIAGNOSIS — M172 Bilateral post-traumatic osteoarthritis of knee: Secondary | ICD-10-CM

## 2015-03-19 DIAGNOSIS — M19019 Primary osteoarthritis, unspecified shoulder: Secondary | ICD-10-CM | POA: Insufficient documentation

## 2015-03-19 DIAGNOSIS — M160 Bilateral primary osteoarthritis of hip: Secondary | ICD-10-CM

## 2015-03-19 DIAGNOSIS — M79605 Pain in left leg: Secondary | ICD-10-CM | POA: Diagnosis present

## 2015-03-19 MED ORDER — OXYCODONE HCL 10 MG PO TABS: ORAL_TABLET | ORAL | Status: DC

## 2015-03-19 NOTE — Progress Notes (Signed)
Safety precautions to be maintained throughout the outpatient stay will include: orient to surroundings, keep bed in low position, maintain call bell within reach at all times, provide assistance with transfer out of bed and ambulation.  

## 2015-03-19 NOTE — Progress Notes (Signed)
   Subjective:    Patient ID: Kimberly Shaffer, female    DOB: 12-May-1951, 64 y.o.   MRN: [id number]  HPI  Patient is 64 year old female returns to pain management Center for further evaluation and treatment of pain involving the region of the lower back lower extremity region. Patient is undergone recent ENT evaluation when patient was admitted to Hospital with difficulty swallowing. Patient states that she was without any evidence of significant lesions or abnormalities requiring additional interventional treatment. Patient states that her lower back lower extremity pain has been sick severe at times with pain increases with standing walking twisting turning maneuvers. Patient denies any trauma change in events of daily living the call significant change in the lumbar lower extremity pain paresthesias. We discussed patient's overall condition present time we informed patient will report avoid interventional treatment at this time due to patient's general medical condition. The patient was understanding and will continue oxycodone as prescribed at this time. Patient is to call Pain Management Center should there be significant change in condition prior to scheduled return for patient standing and reach as planned     Review of Systems     Objective:   Physical Exam PatientAnd occipitalis musculature regions reproduced mild discomfort. There was mild tinnitus of the cervical facet cervical paraspinal muscles region and thoracic facet thoracic paraspinal musculature region. Tinel and Phalen's maneuver were without increase of pain of any significant degree. There was unremarkable Spurling's maneuver. Palpation of the thoracic facet thoracic paraspinal muscles region was associated palpation of the lower thoracic region with moderate muscle spasms in the lower thoracic region noted on the left as well as on the right. No crepitus of the thoracic region was noted. Palpation of the lumbar paraspinal muscles  region lumbar facet region with tinged palpation of moderate degree with lateral bending rotation extension and palpation of the lumbar facets reproducing moderate discomfort. Palpation of the PSIS and PII S region reproduced pain of moderate degree. Straight leg raising limited to approximately 20 without increased pain with dorsiflexion noted. Negative clonus negative Homans. Well-healed surgical scar of the knee without increased warmth or erythema region of the knee. EHL strength appeared to be decreased and no sensory deficit of dermatomal distribution detected. Abdomen nontender and no costovertebral maintenance noted.        degenerative disc disease lumbar spine   L1 to, L2-3, L3-4, L4-5, and L5-S1 with multilevel degenerative disc disease, disc bulging, broad-based disc bulging, left paracentral disc protrusion, superior margination of disc material, bilateral facet arthropathy, L1-2 and L2-3 and L3-4 with bilateral facet arthropathy as well as L4-5 and L5-S1.   Lumbar facet syndrome   Sacroiliac joint dysfunction   Degenerative joint disease of the knees   Status post total knee replacement   Degenerative joint disease shoulder     Plan    Continue present medications oxycodone  F/U PCP Abrill  for evaliation of  BP and general medical  condition.  F/U surgical evaluation  F/U neurological evaluation  ENT F/U evaluation with Dr.:Cohen as discussed  May consider radiofrequency rhizolysis or intraspinal procedures pending response to present treatment and F/U evaluation.  Patient to call Pain Management Center should patient have concerns prior to scheduled return appointment.

## 2015-03-19 NOTE — Patient Instructions (Addendum)
Continue present medications oxycodone  F/U PCP Dr.Abrill  for evaliation of  BP and general medical  condition.  F/U ENT with Dr.Cohen as discussed   F/U surgical evaluation  F/U neurological evaluation  May consider radiofrequency rhizolysis or intraspinal procedures pending response to present treatment and F/U evaluation.  Patient to call Pain Management Center should patient have concerns prior to scheduled return appointment.

## 2015-03-19 NOTE — Progress Notes (Signed)
Discharge patient home ambulatory at 1043hrs Teach back 3 done Patient to f/u with PCP and Dr Patrice Paradise Script given for oxycodone Return in one month

## 2015-04-15 ENCOUNTER — Ambulatory Visit: Payer: Medicare PPO | Attending: Pain Medicine | Admitting: Pain Medicine

## 2015-04-15 ENCOUNTER — Encounter: Payer: Self-pay | Admitting: Pain Medicine

## 2015-04-15 VITALS — BP 143/68 | HR 62 | Temp 98.3°F | Resp 15 | Ht 66.0 in | Wt 247.0 lb

## 2015-04-15 DIAGNOSIS — M47816 Spondylosis without myelopathy or radiculopathy, lumbar region: Secondary | ICD-10-CM | POA: Diagnosis not present

## 2015-04-15 DIAGNOSIS — I10 Essential (primary) hypertension: Secondary | ICD-10-CM | POA: Diagnosis not present

## 2015-04-15 DIAGNOSIS — M5136 Other intervertebral disc degeneration, lumbar region: Secondary | ICD-10-CM | POA: Insufficient documentation

## 2015-04-15 DIAGNOSIS — M174 Other bilateral secondary osteoarthritis of knee: Secondary | ICD-10-CM | POA: Insufficient documentation

## 2015-04-15 DIAGNOSIS — M542 Cervicalgia: Secondary | ICD-10-CM | POA: Diagnosis present

## 2015-04-15 DIAGNOSIS — M5137 Other intervertebral disc degeneration, lumbosacral region: Secondary | ICD-10-CM

## 2015-04-15 DIAGNOSIS — M533 Sacrococcygeal disorders, not elsewhere classified: Secondary | ICD-10-CM | POA: Insufficient documentation

## 2015-04-15 DIAGNOSIS — E119 Type 2 diabetes mellitus without complications: Secondary | ICD-10-CM | POA: Diagnosis not present

## 2015-04-15 DIAGNOSIS — M79602 Pain in left arm: Secondary | ICD-10-CM | POA: Diagnosis present

## 2015-04-15 DIAGNOSIS — M47817 Spondylosis without myelopathy or radiculopathy, lumbosacral region: Secondary | ICD-10-CM

## 2015-04-15 DIAGNOSIS — M5126 Other intervertebral disc displacement, lumbar region: Secondary | ICD-10-CM | POA: Insufficient documentation

## 2015-04-15 DIAGNOSIS — Z96659 Presence of unspecified artificial knee joint: Secondary | ICD-10-CM | POA: Diagnosis not present

## 2015-04-15 DIAGNOSIS — M170 Bilateral primary osteoarthritis of knee: Secondary | ICD-10-CM

## 2015-04-15 DIAGNOSIS — Z96653 Presence of artificial knee joint, bilateral: Secondary | ICD-10-CM

## 2015-04-15 DIAGNOSIS — M19012 Primary osteoarthritis, left shoulder: Secondary | ICD-10-CM | POA: Diagnosis not present

## 2015-04-15 DIAGNOSIS — M150 Primary generalized (osteo)arthritis: Secondary | ICD-10-CM

## 2015-04-15 DIAGNOSIS — M79601 Pain in right arm: Secondary | ICD-10-CM | POA: Diagnosis present

## 2015-04-15 DIAGNOSIS — M19011 Primary osteoarthritis, right shoulder: Secondary | ICD-10-CM | POA: Diagnosis not present

## 2015-04-15 DIAGNOSIS — M159 Polyosteoarthritis, unspecified: Secondary | ICD-10-CM

## 2015-04-15 DIAGNOSIS — M1610 Unilateral primary osteoarthritis, unspecified hip: Secondary | ICD-10-CM | POA: Diagnosis not present

## 2015-04-15 DIAGNOSIS — M7060 Trochanteric bursitis, unspecified hip: Secondary | ICD-10-CM | POA: Insufficient documentation

## 2015-04-15 MED ORDER — OXYCODONE HCL 10 MG PO TABS: ORAL_TABLET | ORAL | Status: DC

## 2015-04-15 NOTE — Progress Notes (Signed)
Safety precautions to be maintained throughout the outpatient stay will include: orient to surroundings, keep bed in low position, maintain call bell within reach at all times, provide assistance with transfer out of bed and ambulation.  

## 2015-04-15 NOTE — Progress Notes (Signed)
Subjective:    Patient ID: Kimberly Shaffer, female    DOB: 1950/10/18, 64 y.o.   MRN: [id number]  HPI    Patient is 64 year old female returns to [place] for further evaluation and treatment of pain involving the region of the neck upper extremity regions shoulders lower back lower extremity region and especially the knees. The patient states that pain of the knees is quite bothersome with pain occurring in the region of the left knee. The patient is status post left total knee replacement. We discussed geniculate nerve blocks of the knee in attempt to decrease the severity of symptoms, minimize progression of symptoms, and avoid the need for more involved treatment. The patient is understanding and will proceed with geniculate nerve blocks of the knee at time return appointment as discussed. We will continue oxycodone as prescribed this time. Patient is to call pain management should there be change in condition and patient will follow-up with surgeon regarding total knee replacement as well in hopes of being able to obtain significant relief with geniculate nerve blocks and avoid the need for more involved treatment of the knee.       Review of Systems     Objective:   Physical Exam  There was minimal tenderness of the splenius capitis and occipitalis musculature region. Palpation of the cervical facet cervical paraspinal musculature region reproduced mild discomfort. There was moderate tenderness of the acromioclavicular glenohumeral joint region with limited range of motion of the shoulders. Palpation over the region of the thoracic facet thoracic paraspinal musculature region was a tends to palpation of moderate degree. There was moderate muscle spasms of the thoracic region in the upper mid and lower thoracic regions. No crepitus of the thoracic region was noted. There was no definite increase of pain with Tinel and Phalen's maneuver. Palpation over the thoracic facet  thoracic paraspinal musculature region reproduced pain of mild degree. No there was tends to palpation over the lumbar paraspinal muscles region lumbar facet region palpation was reproduced pain of moderate degree. Lateral bending and rotation and extension and palpation of the lumbar facets reproduce moderate discomfort. There were well-healed surgical scars of the knee without increased warmth or erythema in the region of the knee. Palpation of the knee was a tends to palpation without obvious effusion and without erythema EHL strength appeared to be decreased. There was negative clonus negative Homans abdomen nontender with no costovertebral tenderness noted.      Assessment & Plan:   Progress Notes   Kimberly Shaffer (MR# [id number])      Progress Notes Info    Author Note Status Last Update User Last Update Date/Time   Mohammed Kindle, MD Signed Mohammed Kindle, MD 03/09/2015 6:10 PM    Progress Notes    Expand All Collapse All     Subjective:    Patient ID: Kimberly Shaffer, female DOB: 07/06/1950, 64 y.o. MRN: [id number]  HPI  the patient is a 63 year old female who comes to pain management Center at the request of [facility] Re[facility] for further evaluation and treatment of pain involving the lower back lower extremity region. The patient is status post surgical intervention of the lumbar region 3. Patient is undergone evaluation by Dr. Napoleon Form was recommended patient comes to pain management Center for further treatment of her condition. We discussed patient's condition on today's visit. Patient described her pain as aching burning deep sensation with feeling of constriction and pressure-like sensation involving the buttocks on the left predominantly.  The pain occasionally awakens patient from sleep and is aggravated by walking working any motion sitting standing and lifting climbing bending. Patient states that the pain has decreased with hot packs resting sitting sleeping use  of brace physical therapy. We discussed patient's overall condition and will consider patient for interventional treatment at time return appointment. We will proceed with [place] and sciatic nerve block at time of return appointment. We will also discussed patient being candidate for ilioinguinal , iliohypogastric, genitofemoral nerve blocks as well as interventional treatment of the spine. We will observe response to cluneal and sciatic nerve block, consider patient for additional modification of treatment pending response to cluneal sciatic nerve block. The patient was understanding and agreed with suggested treatment plan.      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 Palpation of the cervical facet thoracic facet regions were with tenderness to palpation of mild degree. Palpation of the acromioclavicular and glenohumeral joint regions were with mild discomfort. Tinel and Phalen's maneuver were without increase of pain of significant degree. There was no crepitus of the thoracic region noted. Palpation of the lower thoracic paraspinal musculature region was with evidence of mild to moderate muscle spasm. Palpation over the lumbar paraspinal musculature region lumbar facet region was a tends to palpation of moderate degree left greater than the right. Lateral bending and rotation and extension and palpation of the lumbar facets reproduce moderate to  moderately severe discomfort period there was significant increase of pain with palpation of the PSIS PSIS region. Palpation of the gluteal and piriformis musculature regions reproduced moderate to moderately severe pain. Straight leg Raising was tolerates approximately 20 without increase of pain with dorsiflexion noted. There was negative clonus negative Homans. Palpation of the greater trochanteric region iliotibial band region was a tends to palpation of mild degree. Negative clonus negative Homans. Abdomen nontender and no costovertebral angle tenderness noted.       Assessment & Plan:       Progress Notes   GWILI FELMLEE (MR# [id number])      Progress Notes Info    Author Note Status Last Update User Last Update Date/Time   Mohammed Kindle, MD Signed Mohammed Kindle, MD 02/18/2015 3:00 PM    Progress Notes    Expand All Collapse All     Subjective:    Patient ID: TERISSA STARR, female DOB: 1951-08-28, 64 y.o. MRN: [id number]  HPI  Patient is 64 year old female returns to [place] for further evaluation and treatment of pain involving the neck and entire back upper and lower extremity regions. Recently was seen by her primary care physician and discussed the pain involving the region of the hip and buttocks region. The patient's primary care physician informed patient to follow-up in pain management to consider having an injection. We discussed patient's condition and after evaluation of patient we will proceed with sacroiliac joint injection at time of return appointment. Response to the sacroiliac joint injection we may consider injection of the hip well as other procedures. The patient states the pain is severely incapacitating and that she is  in hopes of being able to undergo procedure for treatment of the severely disabling pain interferes with activities of daily living as well as awakens patient from sleep.   Review of Systems     Objective:    Physical Exam There was tenderness over the splenius capitis and occipitalis musculature region. Palpation of the cervical facet cervical paraspinal muscles were returns to palpation of mild to moderate degree. There was mild tenderness of the acromioclavicular glenohumeral joint region with what appeared to be unremarkable Spurling's maneuver Tinel's and Phalen's maneuver were without increased pain of significant degree.. Palpation of the thoracic facet thoracic paraspinal musculature region was associated with mild to moderate discomfort with no crepitus of the thoracic region noted. Palpation of the lumbar paraspinal musculature region lumbar facet region was associated with increased pain of moderate C severe discomfort.. There was severe tenderness to palpation noted over the PSIS and PII S regions. There was moderate tenderness of the greater trochanteric region and iliotibial band region as well. Patrick's maneuver was with increased pain of moderate degree. 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 Patient scheduled follow-up with primary care  physician for evaluation of blood pressure pulse and general medical condition as well as for medical clearance for steroid injection consisting of sacroiliac joint injection  F/U surgical evaluation Of knee as planned  F/U neurological evaluation.  May consider radiofrequency rhizolysis or intraspinal procedures pending response to present treatment and F/U evaluation.  Patient to call Pain Management Center should patient have concerns prior to scheduled return appointment.

## 2015-04-15 NOTE — Patient Instructions (Addendum)
Continue present medication oxycodone  Geniculate nerve blocks of the knee to be performed Wednesday, 04/22/2015  F/U PCP Dr. Julieanne Cotton for evaliation of  BP and general medical  condition.  F/U surgical evaluation Dr Patrice Paradise  F/U neurological evaluation  May consider radiofrequency rhizolysis or intraspinal procedures pending response to present treatment and F/U evaluation.  Patient to call [facility] should patient have concerns prior to scheduled return appointment. Knee Injection Joint injections are shots. Your caregiver will place a needle into your knee joint. The needle is used to put medicine into the joint. These shots can be used to help treat different painful knee conditions such as osteoarthritis, bursitis, local flare-ups of rheumatoid arthritis, and pseudogout. Anti-inflammatory medicines such as corticosteroids and anesthetics are the most common medicines used for joint and soft tissue injections.  PROCEDURE  The skin over the kneecap will be cleaned with an antiseptic solution.  Your caregiver will inject a small amount of a local anesthetic (a medicine like Novocaine) just under the skin in the area that was cleaned.  After the area becomes numb, a second injection is done. This second injection usually includes an anesthetic and an anti-inflammatory medicine called a steroid or cortisone. The needle is carefully placed in between the kneecap and the knee, and the medicine is injected into the joint space.  After the injection is done, the needle is removed. Your caregiver may place a bandage over the injection site. The whole procedure takes no more than a couple of minutes. BEFORE THE PROCEDURE  Wash all of the skin around the entire knee area. Try to remove any loose, scaling skin. There is no other specific preparation necessary unless advised otherwise by your caregiver. LET YOUR CAREGIVER KNOW ABOUT:   Allergies.  Medications taken including herbs, eye  drops, over the counter medications, and creams.  Use of steroids (by mouth or creams).  Possible pregnancy, if applicable.  Previous problems with anesthetics or Novocaine.  History of blood clots (thrombophlebitis).  History of bleeding or blood problems.  Previous surgery.  Other health problems. RISKS AND COMPLICATIONS Side effects from cortisone shots are rare. They include:   Slight bruising of the skin.  Shrinkage of the normal fatty tissue under the skin where the shot was given.  Increase in pain after the shot.  Infection.  Weakening of tendons or tendon rupture.  Allergic reaction to the medicine.  Diabetics may have a temporary increase in their blood sugar after a shot.  Cortisone can temporarily weaken the immune system. While receiving these shots, you should not get certain vaccines. Also, avoid contact with anyone who has chickenpox or measles. Especially if you have never had these diseases or have not been previously immunized. Your immune system may not be strong enough to fight off the infection while the cortisone is in your system. AFTER THE PROCEDURE   You can go home after the procedure.  You may need to put ice on the joint 15-20 minutes every 3 or 4 hours until the pain goes away.  You may need to put an elastic bandage on the joint. HOME CARE INSTRUCTIONS   Only take over-the-counter or prescription medicines for pain, discomfort, or fever as directed by your caregiver.  You should avoid stressing the joint. Unless advised otherwise, avoid activities that put a lot of pressure on a knee joint, such as:  Jogging.  Bicycling.  Recreational climbing.  Hiking.  Laying down and elevating the leg/knee above the level of  your heart can help to minimize swelling. SEEK MEDICAL CARE IF:   You have repeated or worsening swelling.  There is drainage from the puncture area.  You develop red streaking that extends above or below the site  where the needle was inserted. SEEK IMMEDIATE MEDICAL CARE IF:   You develop a fever.  You have pain that gets worse even though you are taking pain medicine.  The area is red and warm, and you have trouble moving the joint. MAKE SURE YOU:   Understand these instructions.  Will watch your condition.  Will get help right away if you are not doing well or get worse. Document Released: 11/27/2006 Document Revised: 11/28/2011 Document Reviewed: 08/24/2007 [facility] Patient Information 2015 [place], [place]. This information is not intended to replace advice given to you by your health care provider. Make sure you discuss any questions you have with your health care provider. GENERAL RISKS AND COMPLICATIONS  What are the risk, side effects and possible complications? Generally speaking, most procedures are safe.  However, with any procedure there are risks, side effects, and the possibility of complications.  The risks and complications are dependent upon the sites that are lesioned, or the type of nerve block to be performed.  The closer the procedure is to the spine, the more serious the risks are.  Great care is taken when placing the radio frequency needles, block needles or lesioning probes, but sometimes complications can occur.  Infection: Any time there is an injection through the skin, there is a risk of infection.  This is why sterile conditions are used for these blocks.  There are four possible types of infection.  Localized skin infection.  Central Nervous System Infection-This can be in the form of Meningitis, which can be deadly.  Epidural Infections-This can be in the form of an epidural abscess, which can cause pressure inside of the spine, causing compression of the spinal cord with subsequent paralysis. This would require an emergency surgery to decompress, and there are no guarantees that the patient would recover from the paralysis.  Discitis-This is an infection of the  intervertebral discs.  It occurs in about 1% of discography procedures.  It is difficult to treat and it may lead to surgery.        2. Pain: the needles have to go through skin and soft tissues, will cause soreness.       3. Damage to internal structures:  The nerves to be lesioned may be near blood vessels or    other nerves which can be potentially damaged.       4. Bleeding: Bleeding is more common if the patient is taking blood thinners such as  aspirin, Coumadin, Ticiid, Plavix, etc., or if he/she have some genetic predisposition  such as hemophilia. Bleeding into the spinal canal can cause compression of the spinal  cord with subsequent paralysis.  This would require an emergency surgery to  decompress and there are no guarantees that the patient would recover from the  paralysis.       5. Pneumothorax:  Puncturing of a lung is a possibility, every time a needle is introduced in  the area of the chest or upper back.  Pneumothorax refers to free air around the  collapsed lung(s), inside of the thoracic cavity (chest cavity).  Another two possible  complications related to a similar event would include: Hemothorax and Chylothorax.   These are variations of the Pneumothorax, where instead of air around the collapsed  lung(s), you may have blood or chyle, respectively.       6. Spinal headaches: They may occur with any procedures in the area of the spine.       7. Persistent CSF (Cerebro-Spinal Fluid) leakage: This is a rare problem, but may occur  with prolonged intrathecal or epidural catheters either due to the formation of a fistulous  track or a dural tear.       8. Nerve damage: By working so close to the spinal cord, there is always a possibility of  nerve damage, which could be as serious as a permanent spinal cord injury with  paralysis.       9. Death:  Although rare, severe deadly allergic reactions known as "Anaphylactic  reaction" can occur to any of the medications used.       10. Worsening of the symptoms:  We can always make thing worse.  What are the chances of something like this happening? Chances of any of this occuring are extremely low.  By statistics, you have more of a chance of getting killed in a motor vehicle accident: while driving to the hospital than any of the above occurring .  Nevertheless, you should be aware that they are possibilities.  In general, it is similar to taking a shower.  Everybody knows that you can slip, hit your head and get killed.  Does that mean that you should not shower again?  Nevertheless always keep in mind that statistics do not mean anything if you happen to be on the wrong side of them.  Even if a procedure has a 1 (one) in a 1,000,000 (million) chance of going wrong, it you happen to be that one..Also, keep in mind that by statistics, you have more of a chance of having something go wrong when taking medications.  Who should not have this procedure? If you are on a blood thinning medication (e.g. Coumadin, Plavix, see list of "Blood Thinners"), or if you have an active infection going on, you should not have the procedure.  If you are taking any blood thinners, please inform your physician.  How should I prepare for this procedure?  Do not eat or drink anything at least six hours prior to the procedure.  Bring a driver with you .  It cannot be a taxi.  Come accompanied by an adult that can drive you back, and that is strong enough to help you if your legs get weak or numb from the local anesthetic.  Take all of your medicines the morning of the procedure with just enough water to swallow them.  If you have diabetes, make sure that you are scheduled to have your procedure done first thing in the morning, whenever possible.  If you have diabetes, take only half of your insulin dose and notify our nurse that you have done so as soon as you arrive at the clinic.  If you are diabetic, but only take blood sugar pills (oral  hypoglycemic), then do not take them on the morning of your procedure.  You may take them after you have had the procedure.  Do not take aspirin or any aspirin-containing medications, at least eleven (11) days prior to the procedure.  They may prolong bleeding.  Wear loose fitting clothing that may be easy to take off and that you would not mind if it got stained with Betadine or blood.  Do not wear any jewelry or perfume  Remove any nail coloring.  It  will interfere with some of our monitoring equipment.  NOTE: Remember that this is not meant to be interpreted as a complete list of all possible complications.  Unforeseen problems may occur.  BLOOD THINNERS The following drugs contain aspirin or other products, which can cause increased bleeding during surgery and should not be taken for 2 weeks prior to and 1 week after surgery.  If you should need take something for relief of minor pain, you may take acetaminophen which is found in Tylenol,m Datril, Anacin-3 and Panadol. It is not blood thinner. The products listed below are.  Do not take any of the products listed below in addition to any listed on your instruction sheet.  A.P.C or A.P.C with Codeine Codeine Phosphate Capsules #3 Ibuprofen Ridaura  ABC compound Congesprin Imuran rimadil  Advil Cope Indocin Robaxisal  Alka-Seltzer Effervescent Pain Reliever and Antacid Coricidin or Coricidin-D  Indomethacin Rufen  Alka-Seltzer plus Cold Medicine Cosprin Ketoprofen S-A-C Tablets  Anacin Analgesic Tablets or Capsules Coumadin Korlgesic Salflex  Anacin Extra Strength Analgesic tablets or capsules CP-2 Tablets Lanoril Salicylate  Anaprox Cuprimine Capsules Levenox Salocol  Anexsia-D Dalteparin Magan Salsalate  Anodynos Darvon compound Magnesium Salicylate Sine-off  Ansaid Dasin Capsules Magsal Sodium Salicylate  Anturane Depen Capsules Marnal Soma  APF Arthritis pain formula Dewitt's Pills Measurin Stanback  Argesic Dia-Gesic Meclofenamic  Sulfinpyrazone  Arthritis Bayer Timed Release Aspirin Diclofenac Meclomen Sulindac  Arthritis pain formula Anacin Dicumarol Medipren Supac  Analgesic (Safety coated) Arthralgen Diffunasal Mefanamic Suprofen  Arthritis Strength Bufferin Dihydrocodeine Mepro Compound Suprol  Arthropan liquid Dopirydamole Methcarbomol with Aspirin Synalgos  ASA tablets/Enseals Disalcid Micrainin Tagament  Ascriptin Doan's Midol Talwin  Ascriptin A/D Dolene Mobidin Tanderil  Ascriptin Extra Strength Dolobid Moblgesic Ticlid  Ascriptin with Codeine Doloprin or Doloprin with Codeine Momentum Tolectin  Asperbuf Duoprin Mono-gesic Trendar  Aspergum Duradyne Motrin or Motrin IB Triminicin  Aspirin plain, buffered or enteric coated Durasal Myochrisine Trigesic  Aspirin Suppositories Easprin Nalfon Trillsate  Aspirin with Codeine Ecotrin Regular or Extra Strength Naprosyn Uracel  Atromid-S Efficin Naproxen Ursinus  Auranofin Capsules Elmiron Neocylate Vanquish  Axotal Emagrin Norgesic Verin  Azathioprine Empirin or Empirin with Codeine Normiflo Vitamin E  Azolid Emprazil Nuprin Voltaren  Bayer Aspirin plain, buffered or children's or timed BC Tablets or powders Encaprin Orgaran Warfarin Sodium  Buff-a-Comp Enoxaparin Orudis Zorpin  Buff-a-Comp with Codeine Equegesic Os-Cal-Gesic   Buffaprin Excedrin plain, buffered or Extra Strength Oxalid   Bufferin Arthritis Strength Feldene Oxphenbutazone   Bufferin plain or Extra Strength Feldene Capsules Oxycodone with Aspirin   Bufferin with Codeine Fenoprofen Fenoprofen Pabalate or Pabalate-SF   Buffets II Flogesic Panagesic   Buffinol plain or Extra Strength Florinal or Florinal with Codeine Panwarfarin   Buf-Tabs Flurbiprofen Penicillamine   Butalbital Compound Four-way cold tablets Penicillin   Butazolidin Fragmin Pepto-Bismol   Carbenicillin Geminisyn Percodan   Carna Arthritis Reliever Geopen Persantine   Carprofen Gold's salt Persistin   Chloramphenicol Goody's  Phenylbutazone   Chloromycetin Haltrain Piroxlcam   Clmetidine heparin Plaquenil   Cllnoril Hyco-pap Ponstel   Clofibrate Hydroxy chloroquine Propoxyphen         Before stopping any of these medications, be sure to consult the physician who ordered them.  Some, such as Coumadin (Warfarin) are ordered to prevent or treat serious conditions such as "deep thrombosis", "pumonary embolisms", and other heart problems.  The amount of time that you may need off of the medication may also vary with the medication and the reason for which you were  taking it.  If you are taking any of these medications, please make sure you notify your pain physician before you undergo any procedures.

## 2015-04-22 DIAGNOSIS — I10 Essential (primary) hypertension: Secondary | ICD-10-CM | POA: Insufficient documentation

## 2015-04-24 ENCOUNTER — Encounter: Payer: Medicare HMO | Admitting: Podiatry

## 2015-04-29 ENCOUNTER — Ambulatory Visit: Payer: Medicare PPO | Attending: Pain Medicine | Admitting: Pain Medicine

## 2015-04-29 ENCOUNTER — Encounter: Payer: Self-pay | Admitting: Pain Medicine

## 2015-04-29 ENCOUNTER — Telehealth: Payer: Self-pay | Admitting: Pain Medicine

## 2015-04-29 VITALS — BP 167/68 | HR 53 | Temp 98.0°F | Resp 16 | Ht 66.5 in | Wt 235.0 lb

## 2015-04-29 DIAGNOSIS — M160 Bilateral primary osteoarthritis of hip: Secondary | ICD-10-CM

## 2015-04-29 DIAGNOSIS — M47817 Spondylosis without myelopathy or radiculopathy, lumbosacral region: Secondary | ICD-10-CM

## 2015-04-29 DIAGNOSIS — Z96659 Presence of unspecified artificial knee joint: Secondary | ICD-10-CM | POA: Diagnosis not present

## 2015-04-29 DIAGNOSIS — M170 Bilateral primary osteoarthritis of knee: Secondary | ICD-10-CM

## 2015-04-29 DIAGNOSIS — M79605 Pain in left leg: Secondary | ICD-10-CM | POA: Insufficient documentation

## 2015-04-29 DIAGNOSIS — M5137 Other intervertebral disc degeneration, lumbosacral region: Secondary | ICD-10-CM

## 2015-04-29 DIAGNOSIS — M545 Low back pain: Secondary | ICD-10-CM | POA: Insufficient documentation

## 2015-04-29 DIAGNOSIS — Z96653 Presence of artificial knee joint, bilateral: Secondary | ICD-10-CM

## 2015-04-29 DIAGNOSIS — M7060 Trochanteric bursitis, unspecified hip: Secondary | ICD-10-CM

## 2015-04-29 DIAGNOSIS — M79604 Pain in right leg: Secondary | ICD-10-CM | POA: Diagnosis present

## 2015-04-29 MED ORDER — MIDAZOLAM HCL 5 MG/5ML IJ SOLN
INTRAMUSCULAR | Status: AC
Start: 2015-04-29 — End: 2015-04-29
  Administered 2015-04-29: 2 mg via INTRAVENOUS
  Filled 2015-04-29: qty 5

## 2015-04-29 MED ORDER — BUPIVACAINE HCL (PF) 0.25 % IJ SOLN
INTRAMUSCULAR | Status: AC
  Administered 2015-04-29: 20 mL
  Filled 2015-04-29: qty 30

## 2015-04-29 MED ORDER — CEFAZOLIN SODIUM 1 G IJ SOLR
INTRAMUSCULAR | Status: AC
  Administered 2015-04-29: 1 g via INTRAVENOUS
  Filled 2015-04-29: qty 10

## 2015-04-29 MED ORDER — CEFUROXIME AXETIL 250 MG PO TABS: 250.0000 mg | ORAL_TABLET | Freq: Two times a day (BID) | ORAL | Status: DC

## 2015-04-29 MED ORDER — ORPHENADRINE CITRATE 30 MG/ML IJ SOLN
INTRAMUSCULAR | Status: AC
  Filled 2015-04-29: qty 2

## 2015-04-29 MED ORDER — LIDOCAINE HCL (PF) 1 % IJ SOLN
INTRAMUSCULAR | Status: AC
  Filled 2015-04-29: qty 5

## 2015-04-29 MED ORDER — TRIAMCINOLONE ACETONIDE 40 MG/ML IJ SUSP
INTRAMUSCULAR | Status: AC
  Administered 2015-04-29: 40 mg
  Filled 2015-04-29: qty 1

## 2015-04-29 MED ORDER — FENTANYL CITRATE (PF) 100 MCG/2ML IJ SOLN
INTRAMUSCULAR | Status: AC
  Administered 2015-04-29: 50 ug via INTRAVENOUS
  Filled 2015-04-29: qty 2

## 2015-04-29 NOTE — Patient Instructions (Addendum)
Continue present and obtain Ceftin antibiotic and begin taking your antibiotic today  F/U PCP D Abrill for evaliation of  BP and general medical  condition  F/U surgical evaluation . Follow-up surgical evaluation as discussed   F/U neurological evaluation  May consider radiofrequency rhizolysis or intraspinal procedures pending response to present treatment and F/U evaluation   Patient to call [facility] Center should patient have concerns prior to scheduled return appointmen. Pain Management Discharge Instructions  General Discharge Instructions :  If you need to reach your doctor call: Monday-Friday 8:00 am - 4:00 pm at [phone] or toll free [phone].  After clinic hours [phone] to have operator reach doctor.  Bring all of your medication bottles to all your appointments in the pain clinic.  To cancel or reschedule your appointment with Pain Management please remember to call 24 hours in advance to avoid a fee.  Refer to the educational materials which you have been given on: General Risks, I had my Procedure. Discharge Instructions, Post Sedation.  Post Procedure Instructions:  The drugs you were given will stay in your system until tomorrow, so for the next 24 hours you should not drive, make any legal decisions or drink any alcoholic beverages.  You may eat anything you prefer, but it is better to start with liquids then soups and crackers, and gradually work up to solid foods.  Please notify your doctor immediately if you have any unusual bleeding, trouble breathing or pain that is not related to your normal pain.  Depending on the type of procedure that was done, some parts of your body may feel week and/or numb.  This usually clears up by tonight or the next day.  Walk with the use of an assistive device or accompanied by an adult for the 24 hours.  You may use ice on the affected area for the first 24 hours.  Put ice in a Ziploc bag and cover with a  towel and place against area 15 minutes on 15 minutes off.  You may switch to heat after 24 hours.GENERAL RISKS AND COMPLICATIONS  What are the risk, side effects and possible complications? Generally speaking, most procedures are safe.  However, with any procedure there are risks, side effects, and the possibility of complications.  The risks and complications are dependent upon the sites that are lesioned, or the type of nerve block to be performed.  The closer the procedure is to the spine, the more serious the risks are.  Great care is taken when placing the radio frequency needles, block needles or lesioning probes, but sometimes complications can occur. 1. Infection: Any time there is an injection through the skin, there is a risk of infection.  This is why sterile conditions are used for these blocks.  There are four possible types of infection. 1. Localized skin infection. 2. Central Nervous System Infection-This can be in the form of Meningitis, which can be deadly. 3. Epidural Infections-This can be in the form of an epidural abscess, which can cause pressure inside of the spine, causing compression of the spinal cord with subsequent paralysis. This would require an emergency surgery to decompress, and there are no guarantees that the patient would recover from the paralysis. 4. Discitis-This is an infection of the intervertebral discs.  It occurs in about 1% of discography procedures.  It is difficult to treat and it may lead to surgery.        2. Pain: the needles have to go through skin and  soft tissues, will cause soreness.       3. Damage to internal structures:  The nerves to be lesioned may be near blood vessels or    other nerves which can be potentially damaged.       4. Bleeding: Bleeding is more common if the patient is taking blood thinners such as  aspirin, Coumadin, Ticiid, Plavix, etc., or if he/she have some genetic predisposition  such as hemophilia. Bleeding into the spinal  canal can cause compression of the spinal  cord with subsequent paralysis.  This would require an emergency surgery to  decompress and there are no guarantees that the patient would recover from the  paralysis.       5. Pneumothorax:  Puncturing of a lung is a possibility, every time a needle is introduced in  the area of the chest or upper back.  Pneumothorax refers to free air around the  collapsed lung(s), inside of the thoracic cavity (chest cavity).  Another two possible  complications related to a similar event would include: Hemothorax and Chylothorax.   These are variations of the Pneumothorax, where instead of air around the collapsed  lung(s), you may have blood or chyle, respectively.       6. Spinal headaches: They may occur with any procedures in the area of the spine.       7. Persistent CSF (Cerebro-Spinal Fluid) leakage: This is a rare problem, but may occur  with prolonged intrathecal or epidural catheters either due to the formation of a fistulous  track or a dural tear.       8. Nerve damage: By working so close to the spinal cord, there is always a possibility of  nerve damage, which could be as serious as a permanent spinal cord injury with  paralysis.       9. Death:  Although rare, severe deadly allergic reactions known as "Anaphylactic  reaction" can occur to any of the medications used.      10. Worsening of the symptoms:  We can always make thing worse.  What are the chances of something like this happening? Chances of any of this occuring are extremely low.  By statistics, you have more of a chance of getting killed in a motor vehicle accident: while driving to the hospital than any of the above occurring .  Nevertheless, you should be aware that they are possibilities.  In general, it is similar to taking a shower.  Everybody knows that you can slip, hit your head and get killed.  Does that mean that you should not shower again?  Nevertheless always keep in mind that statistics  do not mean anything if you happen to be on the wrong side of them.  Even if a procedure has a 1 (one) in a 1,000,000 (million) chance of going wrong, it you happen to be that one..Also, keep in mind that by statistics, you have more of a chance of having something go wrong when taking medications.  Who should not have this procedure? If you are on a blood thinning medication (e.g. Coumadin, Plavix, see list of "Blood Thinners"), or if you have an active infection going on, you should not have the procedure.  If you are taking any blood thinners, please inform your physician.  How should I prepare for this procedure?  Do not eat or drink anything at least six hours prior to the procedure.  Bring a driver with you .  It cannot be a taxi.  Come accompanied by an adult that can drive you back, and that is strong enough to help you if your legs get weak or numb from the local anesthetic.  Take all of your medicines the morning of the procedure with just enough water to swallow them.  If you have diabetes, make sure that you are scheduled to have your procedure done first thing in the morning, whenever possible.  If you have diabetes, take only half of your insulin dose and notify our nurse that you have done so as soon as you arrive at the clinic.  If you are diabetic, but only take blood sugar pills (oral hypoglycemic), then do not take them on the morning of your procedure.  You may take them after you have had the procedure.  Do not take aspirin or any aspirin-containing medications, at least eleven (11) days prior to the procedure.  They may prolong bleeding.  Wear loose fitting clothing that may be easy to take off and that you would not mind if it got stained with Betadine or blood.  Do not wear any jewelry or perfume  Remove any nail coloring.  It will interfere with some of our monitoring equipment.  NOTE: Remember that this is not meant to be interpreted as a complete list of all  possible complications.  Unforeseen problems may occur.  BLOOD THINNERS The following drugs contain aspirin or other products, which can cause increased bleeding during surgery and should not be taken for 2 weeks prior to and 1 week after surgery.  If you should need take something for relief of minor pain, you may take acetaminophen which is found in Tylenol,m Datril, Anacin-3 and Panadol. It is not blood thinner. The products listed below are.  Do not take any of the products listed below in addition to any listed on your instruction sheet.  A.P.C or A.P.C with Codeine Codeine Phosphate Capsules #3 Ibuprofen Ridaura  ABC compound Congesprin Imuran rimadil  Advil Cope Indocin Robaxisal  Alka-Seltzer Effervescent Pain Reliever and Antacid Coricidin or Coricidin-D  Indomethacin Rufen  Alka-Seltzer plus Cold Medicine Cosprin Ketoprofen S-A-C Tablets  Anacin Analgesic Tablets or Capsules Coumadin Korlgesic Salflex  Anacin Extra Strength Analgesic tablets or capsules CP-2 Tablets Lanoril Salicylate  Anaprox Cuprimine Capsules Levenox Salocol  Anexsia-D Dalteparin Magan Salsalate  Anodynos Darvon compound Magnesium Salicylate Sine-off  Ansaid Dasin Capsules Magsal Sodium Salicylate  Anturane Depen Capsules Marnal Soma  APF Arthritis pain formula Dewitt's Pills Measurin Stanback  Argesic Dia-Gesic Meclofenamic Sulfinpyrazone  Arthritis Bayer Timed Release Aspirin Diclofenac Meclomen Sulindac  Arthritis pain formula Anacin Dicumarol Medipren Supac  Analgesic (Safety coated) Arthralgen Diffunasal Mefanamic Suprofen  Arthritis Strength Bufferin Dihydrocodeine Mepro Compound Suprol  Arthropan liquid Dopirydamole Methcarbomol with Aspirin Synalgos  ASA tablets/Enseals Disalcid Micrainin Tagament  Ascriptin Doan's Midol Talwin  Ascriptin A/D Dolene Mobidin Tanderil  Ascriptin Extra Strength Dolobid Moblgesic Ticlid  Ascriptin with Codeine Doloprin or Doloprin with Codeine Momentum Tolectin   Asperbuf Duoprin Mono-gesic Trendar  Aspergum Duradyne Motrin or Motrin IB Triminicin  Aspirin plain, buffered or enteric coated Durasal Myochrisine Trigesic  Aspirin Suppositories Easprin Nalfon Trillsate  Aspirin with Codeine Ecotrin Regular or Extra Strength Naprosyn Uracel  Atromid-S Efficin Naproxen Ursinus  Auranofin Capsules Elmiron Neocylate Vanquish  Axotal Emagrin Norgesic Verin  Azathioprine Empirin or Empirin with Codeine Normiflo Vitamin E  Azolid Emprazil Nuprin Voltaren  Bayer Aspirin plain, buffered or children's or timed BC Tablets or powders Encaprin Orgaran Warfarin Sodium  Buff-a-Comp Enoxaparin Orudis Zorpin  Buff-a-Comp with Codeine Equegesic Os-Cal-Gesic   Buffaprin Excedrin plain, buffered or Extra Strength Oxalid   Bufferin Arthritis Strength Feldene Oxphenbutazone   Bufferin plain or Extra Strength Feldene Capsules Oxycodone with Aspirin   Bufferin with Codeine Fenoprofen Fenoprofen Pabalate or Pabalate-SF   Buffets II Flogesic Panagesic   Buffinol plain or Extra Strength Florinal or Florinal with Codeine Panwarfarin   Buf-Tabs Flurbiprofen Penicillamine   Butalbital Compound Four-way cold tablets Penicillin   Butazolidin Fragmin Pepto-Bismol   Carbenicillin Geminisyn Percodan   Carna Arthritis Reliever Geopen Persantine   Carprofen Gold's salt Persistin   Chloramphenicol Goody's Phenylbutazone   Chloromycetin Haltrain Piroxlcam   Clmetidine heparin Plaquenil   Cllnoril Hyco-pap Ponstel   Clofibrate Hydroxy chloroquine Propoxyphen         Before stopping any of these medications, be sure to consult the physician who ordered them.  Some, such as Coumadin (Warfarin) are ordered to prevent or treat serious conditions such as "deep thrombosis", "pumonary embolisms", and other heart problems.  The amount of time that you may need off of the medication may also vary with the medication and the reason for which you were taking it.  If you are taking any of these  medications, please make sure you notify your pain physician before you undergo any procedures.  Antibiotic to be picked up at your pharmacy.

## 2015-04-29 NOTE — Progress Notes (Signed)
Subjective:    Patient ID: Kimberly Shaffer, female    DOB: 03/17/1951, 64 y.o.   MRN: [id number]  HPI  Geniculate nerve blocks of the left knee   The patient is a 64 y.o. female who returns to the [facility] for further evaluation and treatment of pain involving the lumbar lower extremity region with severe pain of the left knee. Prior studies reveal patient to be with prior total knee replacement. The patient is with severe pain involving the region of the left knee and is undergone orthopedic evaluation with our plans for immediate surgical intervention..  We will proceed with geniculate nerve blocks of the left knee in an attempt to decrease severity of symptoms, minimize the risk of medication escalation, hopefully retard progression of symptoms and avoid the need for more involved treatment.  The risks benefits and expectations of the procedure were discussed with the patient. The patient was with understanding and in agreement with suggested treatment plan.  DESCRIPTION OF PROCEDURE: Geniculate nerve blocks of the left knee. The  procedure was performed with IV Versed and IV fentanyl, conscious sedation and under fluoroscopic guidance.  NEEDLE PLACEMENT FOR BLOCK OF THE LATERAL SUPERIOR GENICULATE NERVE: The patient was taking to the fluoroscopy suite. With the patient supine, with knee in flexed position, Betadine prep of proposed entry site accomplished.  IV Versed, IV fentanyl conscious sedation, EKG, blood pressure, pulse and pulse oximetry monitoring were all in place. Under fluoroscopic guidance, a 22-gauge needle was inserted in the region of the left knee with needle placed at the lateral border of the femur at the junction of the shaft of the femur and the condyle of the femur.  Following needle placement at the lateral aspect of the knee, needle placement was then accomplished in the region of the medial aspect of the knee.  NEEDLE PLACEMENT FOR BLOCK OF THE MEDIAL SUPERIOR  GENICULATE NERVE:  Under fluoroscopic guidance, a 22 - gauge needle was inserted in the region of the left knee with needle placed at the medial border of the femur at the junction of the shaft of the femur and the condyle of the femur.   NEEDLE PLACEMENT FOR BLOCK OF THE MEDIAL INFERIOR GENICULATE NERVE:  Under fluoroscopic guidance, a 22 - gauge needle was inserted in the region of the left knee with needle placed at the junction of the shaft and plateau of the tibia.   Following needle placement on AP view of needles placed in all three locations, placement was then verified on lateral view with the tips of the superior lateral and superior medial needles documented to be one half the distance of the shaft of the femur and the tip of the inferior medial geniculate needle documented to be one half the distance of the shaft of the tibia.  Following documentation of needle placements on lateral view, each needle was injected with one mL of 0.25% bupivacaine with Kenalog. A total of 10 mg of Kenalog was utilized for the entire procedure. The patient tolerated the procedure well.    PLAN 1. Medications: Continue present medication oxycodone and antibiotic Ceftin 2. Follow-up appointment with PCP Dr.Abrill  for evaluation of blood pressure and general medical condition. 3. Follow-up surgical evaluation as discussed  4. Follow-up neurological evaluation 5. He patient may be a candidate for radiofrequency rhizolysis and other treatment pending response to treatment on today's visit and follow-up evaluation. 6. The patient is advised to adhere to proper body mechanics and avoid  activities which appear to aggravate condition      Review of Systems     Objective:   Physical Exam        Assessment & Plan:

## 2015-04-29 NOTE — Progress Notes (Signed)
Safety precautions to be maintained throughout the outpatient stay will include: orient to surroundings, keep bed in low position, maintain call bell within reach at all times, provide assistance with transfer out of bed and ambulation.  

## 2015-04-29 NOTE — Telephone Encounter (Signed)
pcp is no longer Kimberly Shaffer / it is now Kimberly Shaffer

## 2015-04-30 ENCOUNTER — Telehealth: Payer: Self-pay

## 2015-04-30 NOTE — Telephone Encounter (Signed)
Left message

## 2015-05-08 ENCOUNTER — Ambulatory Visit: Admit: 2015-05-08 | Payer: Medicare PPO | Admitting: Internal Medicine

## 2015-05-08 SURGERY — LEFT HEART CATH
Anesthesia: Moderate Sedation

## 2015-05-14 ENCOUNTER — Encounter: Payer: Self-pay | Admitting: Pain Medicine

## 2015-05-14 ENCOUNTER — Ambulatory Visit: Payer: Medicare PPO | Attending: Pain Medicine | Admitting: Pain Medicine

## 2015-05-14 VITALS — BP 131/63 | HR 50 | Temp 97.7°F | Resp 16 | Ht 66.5 in | Wt 232.0 lb

## 2015-05-14 DIAGNOSIS — M19019 Primary osteoarthritis, unspecified shoulder: Secondary | ICD-10-CM | POA: Diagnosis not present

## 2015-05-14 DIAGNOSIS — M1288 Other specific arthropathies, not elsewhere classified, other specified site: Secondary | ICD-10-CM | POA: Diagnosis not present

## 2015-05-14 DIAGNOSIS — M5126 Other intervertebral disc displacement, lumbar region: Secondary | ICD-10-CM | POA: Insufficient documentation

## 2015-05-14 DIAGNOSIS — M5136 Other intervertebral disc degeneration, lumbar region: Secondary | ICD-10-CM | POA: Insufficient documentation

## 2015-05-14 DIAGNOSIS — Z96659 Presence of unspecified artificial knee joint: Secondary | ICD-10-CM | POA: Insufficient documentation

## 2015-05-14 DIAGNOSIS — M159 Polyosteoarthritis, unspecified: Secondary | ICD-10-CM

## 2015-05-14 DIAGNOSIS — M533 Sacrococcygeal disorders, not elsewhere classified: Secondary | ICD-10-CM | POA: Diagnosis not present

## 2015-05-14 DIAGNOSIS — M5416 Radiculopathy, lumbar region: Secondary | ICD-10-CM | POA: Insufficient documentation

## 2015-05-14 DIAGNOSIS — Z96653 Presence of artificial knee joint, bilateral: Secondary | ICD-10-CM | POA: Diagnosis not present

## 2015-05-14 DIAGNOSIS — M170 Bilateral primary osteoarthritis of knee: Secondary | ICD-10-CM | POA: Diagnosis not present

## 2015-05-14 DIAGNOSIS — M546 Pain in thoracic spine: Secondary | ICD-10-CM | POA: Diagnosis present

## 2015-05-14 DIAGNOSIS — M542 Cervicalgia: Secondary | ICD-10-CM | POA: Diagnosis present

## 2015-05-14 DIAGNOSIS — M7060 Trochanteric bursitis, unspecified hip: Secondary | ICD-10-CM

## 2015-05-14 DIAGNOSIS — M47817 Spondylosis without myelopathy or radiculopathy, lumbosacral region: Secondary | ICD-10-CM

## 2015-05-14 DIAGNOSIS — M150 Primary generalized (osteo)arthritis: Secondary | ICD-10-CM

## 2015-05-14 DIAGNOSIS — M5137 Other intervertebral disc degeneration, lumbosacral region: Secondary | ICD-10-CM

## 2015-05-14 MED ORDER — OXYCODONE HCL 10 MG PO TABS: ORAL_TABLET | ORAL | Status: DC

## 2015-05-14 NOTE — Progress Notes (Signed)
Discharged to home ambulatory with script in hand for oxycodone.  Pre procedure instructions given with teach back 3 done. 

## 2015-05-14 NOTE — Progress Notes (Signed)
   Subjective:    Patient ID: Kimberly Shaffer, female    DOB: 03-17-1951, 64 y.o.   MRN: [id number]  HPI  Patient is 64 year old female who returns to [facility] for further evaluation and treatment of pain involving the neck and entire back upper and lower extremity regions. Patient is with pain which involves the region of the shoulders lower back lower extremity regions and knees. Patient has received significant relief of pain following geniculate nerve blocks of the knee. Patient is status post total knee replacements on the left as well as on the right. At the present time patient's complaint of pain involving the lower back and lower extremity region right greater than the left. We discussed patient's condition and will proceed with lumbosacral selective nerve root block at time return appointment in attempt to decrease severity of patient's symptoms, minimize progression of patient's symptoms, and avoid the need for more involved treatment. Patient was understanding and in agreement with suggested treatment plan.  Review of Systems     Objective:   Physical Exam  There was tenderness of the splenius capitis and occipitalis musculature regions. Palpation of the acromioclavicular and glenohumeral joint regions were with moderate tends to palpation and with limited range of motion of the shoulders being noted. There was tenderness of the cervical facet cervical paraspinal musculature region of mild to moderate degree. There was mild to moderate tends to palpation of the thoracic facet thoracic paraspinal musculature region. No crepitus of the thoracic region noted. Tinel and Phalen's maneuver without increased pain of significant degree the patient appeared to be with slightly decreased grip strength. Palpation of the lumbar paraspinal muscles region lumbar facet region was a tends to palpation of moderate degree. There was moderate tenderness of the PSIS PII S region as well as the  gluteal and piriformis musculature regions. Straight leg raising was decreased and tolerates approximately 20 without a definite increased pain with dorsiflexion noted. There was decreased EHL strength noted. The knees were with well-healed surgical scars without increased warmth or erythema in the region of the knees been noted. There was negative clonus and negative Homans. There was question decreased sensation along the L5 dermatomal distribution. Abdomen was nontender and no costovertebral maintenance noted.      Assessment & Plan:    Degenerative disc disease lumbar spine   L1 to, L2-3, L3-4, L4-5, and L5-S1 with multilevel degenerative disc disease, disc bulging, broad-based disc bulging, left paracentral disc protrusion, superior margination of disc material, bilateral facet arthropathy, L1-2 and L2-3 and L3-4 with bilateral facet arthropathy as well as L4-5 and L5-S1.   Lumbar radiculopathy  Lumbar facet syndrome   Sacroiliac joint dysfunction   Degenerative joint disease of the knees   Status post total knee replacements   Degenerative joint disease shoulder    Plan   Continue present medication oxycodone  Lumbosacral selective nerve root block to be performed at time return appointment  F/U Dr.Abrill  for evaliation of  BP and general medical  condition  F/U surgical evaluation as discussed  F/U Dr. Tamala Julian as discussed  F/U Dr Clayborn Bigness and Dr. Serafina Royals. as discussed  F/U neurological evaluation  May consider radiofrequency rhizolysis or intraspinal procedures pending response to present treatment and F/U evaluation   Patient to call Pain Management Center should patient have concerns prior to scheduled return appointment.

## 2015-05-14 NOTE — Patient Instructions (Addendum)
Continue present medication oxycodone  Lumbosacral selective nerve root block to be performed at time return appointment  F/U PCP Dr. Julieanne Cotton  for evaliation of  BP and general medical  condition  F/U surgical evaluation as discussed  F/U neurological evaluation  F/U Drs. Weston as discussed  May consider radiofrequency rhizolysis or intraspinal procedures pending response to present treatment and F/U evaluation   Patient to call [facility] should patient have concerns prior to scheduled return appointmen. GENERAL RISKS AND COMPLICATIONS  What are the risk, side effects and possible complications? Generally speaking, most procedures are safe.  However, with any procedure there are risks, side effects, and the possibility of complications.  The risks and complications are dependent upon the sites that are lesioned, or the type of nerve block to be performed.  The closer the procedure is to the spine, the more serious the risks are.  Great care is taken when placing the radio frequency needles, block needles or lesioning probes, but sometimes complications can occur. 1. Infection: Any time there is an injection through the skin, there is a risk of infection.  This is why sterile conditions are used for these blocks.  There are four possible types of infection. 1. Localized skin infection. 2. Central Nervous System Infection-This can be in the form of Meningitis, which can be deadly. 3. Epidural Infections-This can be in the form of an epidural abscess, which can cause pressure inside of the spine, causing compression of the spinal cord with subsequent paralysis. This would require an emergency surgery to decompress, and there are no guarantees that the patient would recover from the paralysis. 4. Discitis-This is an infection of the intervertebral discs.  It occurs in about 1% of discography procedures.  It is difficult to treat and it may lead to  surgery.        2. Pain: the needles have to go through skin and soft tissues, will cause soreness.       3. Damage to internal structures:  The nerves to be lesioned may be near blood vessels or    other nerves which can be potentially damaged.       4. Bleeding: Bleeding is more common if the patient is taking blood thinners such as  aspirin, Coumadin, Ticiid, Plavix, etc., or if he/she have some genetic predisposition  such as hemophilia. Bleeding into the spinal canal can cause compression of the spinal  cord with subsequent paralysis.  This would require an emergency surgery to  decompress and there are no guarantees that the patient would recover from the  paralysis.       5. Pneumothorax:  Puncturing of a lung is a possibility, every time a needle is introduced in  the area of the chest or upper back.  Pneumothorax refers to free air around the  collapsed lung(s), inside of the thoracic cavity (chest cavity).  Another two possible  complications related to a similar event would include: Hemothorax and Chylothorax.   These are variations of the Pneumothorax, where instead of air around the collapsed  lung(s), you may have blood or chyle, respectively.       6. Spinal headaches: They may occur with any procedures in the area of the spine.       7. Persistent CSF (Cerebro-Spinal Fluid) leakage: This is a rare problem, but may occur  with prolonged intrathecal or epidural catheters either due to the formation of a fistulous  track or a  dural tear.       8. Nerve damage: By working so close to the spinal cord, there is always a possibility of  nerve damage, which could be as serious as a permanent spinal cord injury with  paralysis.       9. Death:  Although rare, severe deadly allergic reactions known as "Anaphylactic  reaction" can occur to any of the medications used.      10. Worsening of the symptoms:  We can always make thing worse.  What are the chances of something like this  happening? Chances of any of this occuring are extremely low.  By statistics, you have more of a chance of getting killed in a motor vehicle accident: while driving to the hospital than any of the above occurring .  Nevertheless, you should be aware that they are possibilities.  In general, it is similar to taking a shower.  Everybody knows that you can slip, hit your head and get killed.  Does that mean that you should not shower again?  Nevertheless always keep in mind that statistics do not mean anything if you happen to be on the wrong side of them.  Even if a procedure has a 1 (one) in a 1,000,000 (million) chance of going wrong, it you happen to be that one..Also, keep in mind that by statistics, you have more of a chance of having something go wrong when taking medications.  Who should not have this procedure? If you are on a blood thinning medication (e.g. Coumadin, Plavix, see list of "Blood Thinners"), or if you have an active infection going on, you should not have the procedure.  If you are taking any blood thinners, please inform your physician.  How should I prepare for this procedure?  Do not eat or drink anything at least six hours prior to the procedure.  Bring a driver with you .  It cannot be a taxi.  Come accompanied by an adult that can drive you back, and that is strong enough to help you if your legs get weak or numb from the local anesthetic.  Take all of your medicines the morning of the procedure with just enough water to swallow them.  If you have diabetes, make sure that you are scheduled to have your procedure done first thing in the morning, whenever possible.  If you have diabetes, take only half of your insulin dose and notify our nurse that you have done so as soon as you arrive at the clinic.  If you are diabetic, but only take blood sugar pills (oral hypoglycemic), then do not take them on the morning of your procedure.  You may take them after you have had the  procedure.  Do not take aspirin or any aspirin-containing medications, at least eleven (11) days prior to the procedure.  They may prolong bleeding.  Wear loose fitting clothing that may be easy to take off and that you would not mind if it got stained with Betadine or blood.  Do not wear any jewelry or perfume  Remove any nail coloring.  It will interfere with some of our monitoring equipment.  NOTE: Remember that this is not meant to be interpreted as a complete list of all possible complications.  Unforeseen problems may occur.  BLOOD THINNERS The following drugs contain aspirin or other products, which can cause increased bleeding during surgery and should not be taken for 2 weeks prior to and 1 week after surgery.  If you  should need take something for relief of minor pain, you may take acetaminophen which is found in Tylenol,m Datril, Anacin-3 and Panadol. It is not blood thinner. The products listed below are.  Do not take any of the products listed below in addition to any listed on your instruction sheet.  A.P.C or A.P.C with Codeine Codeine Phosphate Capsules #3 Ibuprofen Ridaura  ABC compound Congesprin Imuran rimadil  Advil Cope Indocin Robaxisal  Alka-Seltzer Effervescent Pain Reliever and Antacid Coricidin or Coricidin-D  Indomethacin Rufen  Alka-Seltzer plus Cold Medicine Cosprin Ketoprofen S-A-C Tablets  Anacin Analgesic Tablets or Capsules Coumadin Korlgesic Salflex  Anacin Extra Strength Analgesic tablets or capsules CP-2 Tablets Lanoril Salicylate  Anaprox Cuprimine Capsules Levenox Salocol  Anexsia-D Dalteparin Magan Salsalate  Anodynos Darvon compound Magnesium Salicylate Sine-off  Ansaid Dasin Capsules Magsal Sodium Salicylate  Anturane Depen Capsules Marnal Soma  APF Arthritis pain formula Dewitt's Pills Measurin Stanback  Argesic Dia-Gesic Meclofenamic Sulfinpyrazone  Arthritis Bayer Timed Release Aspirin Diclofenac Meclomen Sulindac  Arthritis pain formula  Anacin Dicumarol Medipren Supac  Analgesic (Safety coated) Arthralgen Diffunasal Mefanamic Suprofen  Arthritis Strength Bufferin Dihydrocodeine Mepro Compound Suprol  Arthropan liquid Dopirydamole Methcarbomol with Aspirin Synalgos  ASA tablets/Enseals Disalcid Micrainin Tagament  Ascriptin Doan's Midol Talwin  Ascriptin A/D Dolene Mobidin Tanderil  Ascriptin Extra Strength Dolobid Moblgesic Ticlid  Ascriptin with Codeine Doloprin or Doloprin with Codeine Momentum Tolectin  Asperbuf Duoprin Mono-gesic Trendar  Aspergum Duradyne Motrin or Motrin IB Triminicin  Aspirin plain, buffered or enteric coated Durasal Myochrisine Trigesic  Aspirin Suppositories Easprin Nalfon Trillsate  Aspirin with Codeine Ecotrin Regular or Extra Strength Naprosyn Uracel  Atromid-S Efficin Naproxen Ursinus  Auranofin Capsules Elmiron Neocylate Vanquish  Axotal Emagrin Norgesic Verin  Azathioprine Empirin or Empirin with Codeine Normiflo Vitamin E  Azolid Emprazil Nuprin Voltaren  Bayer Aspirin plain, buffered or children's or timed BC Tablets or powders Encaprin Orgaran Warfarin Sodium  Buff-a-Comp Enoxaparin Orudis Zorpin  Buff-a-Comp with Codeine Equegesic Os-Cal-Gesic   Buffaprin Excedrin plain, buffered or Extra Strength Oxalid   Bufferin Arthritis Strength Feldene Oxphenbutazone   Bufferin plain or Extra Strength Feldene Capsules Oxycodone with Aspirin   Bufferin with Codeine Fenoprofen Fenoprofen Pabalate or Pabalate-SF   Buffets II Flogesic Panagesic   Buffinol plain or Extra Strength Florinal or Florinal with Codeine Panwarfarin   Buf-Tabs Flurbiprofen Penicillamine   Butalbital Compound Four-way cold tablets Penicillin   Butazolidin Fragmin Pepto-Bismol   Carbenicillin Geminisyn Percodan   Carna Arthritis Reliever Geopen Persantine   Carprofen Gold's salt Persistin   Chloramphenicol Goody's Phenylbutazone   Chloromycetin Haltrain Piroxlcam   Clmetidine heparin Plaquenil   Cllnoril Hyco-pap  Ponstel   Clofibrate Hydroxy chloroquine Propoxyphen         Before stopping any of these medications, be sure to consult the physician who ordered them.  Some, such as Coumadin (Warfarin) are ordered to prevent or treat serious conditions such as "deep thrombosis", "pumonary embolisms", and other heart problems.  The amount of time that you may need off of the medication may also vary with the medication and the reason for which you were taking it.  If you are taking any of these medications, please make sure you notify your pain physician before you undergo any procedures.         Selective Nerve Root Block Patient Information  Description: Specific nerve roots exit the spinal canal and these nerves can be compressed and inflamed by a bulging disc and bone spurs.  By injecting steroids on the  nerve root, we can potentially decrease the inflammation surrounding these nerves, which often leads to decreased pain.  Also, by injecting local anesthesia on the nerve root, this can provide [place] helpful information to give to your referring doctor if it decreases your pain.  Selective nerve root blocks can be done along the spine from the neck to the low back depending on the location of your pain.   After numbing the skin with local anesthesia, a small needle is passed to the nerve root and the position of the needle is verified using x-ray pictures.  After the needle is in correct position, we then deposit the medication.  You may experience a pressure sensation while this is being done.  The entire block usually lasts less than 15 minutes.  Conditions that may be treated with selective nerve root blocks:  Low back and leg pain  Spinal stenosis  Diagnostic block prior to potential surgery  Neck and arm pain  Post laminectomy syndrome  Preparation for the injection:  1. Do not eat any solid food or dairy products within 6 hours of your appointment. 2. You may drink clear liquids up to 2  hours before an appointment.  Clear liquids include water, black coffee, juice or soda.  No milk or cream please. 3. You may take your regular medications, including pain medications, with a sip of water before your appointment.  Diabetics should hold regular insulin (if taken separately) and take 1/2 normal NPH dose the morning of the procedure.  Carry some sugar containing items with you to your appointment. 4. A driver must accompany you and be prepared to drive you home after your procedure. 5. Bring all your current medications with you. 6. An IV may be inserted and sedation may be given at the discretion of the physician. 7. A blood pressure cuff, EKG, and other monitors will often be applied during the procedure.  Some patients may need to have extra oxygen administered for a short period. 8. You will be asked to provide medical information, including allergies, prior to the procedure.  We must know immediately if you are taking blood  Thinners (like Coumadin) or if you are allergic to IV iodine contrast (dye).  Possible side-effects: All are usually temporary  Bleeding from needle site  Light headedness  Numbness and tingling  Decreased blood pressure  Weakness in arms/legs  Pressure sensation in back/neck  Pain at injection site (several days)  Possible complications: All are extremely rare  Infection  Nerve injury  Spinal headache (a headache wore with upright position)  Call if you experience:  Fever/chills associated with headache or increased back/neck pain  Headache worsened by an upright position  New onset weakness or numbness of an extremity below the injection site  Hives or difficulty breathing (go to the emergency room)  Inflammation or drainage at the injection site(s)  Severe back/neck pain greater than usual  New symptoms which are concerning to you  Please note:  Although the local anesthetic injected can often make your back or neck feel  good for several hours after the injection the pain will likely return.  It takes 3-5 days for steroids to work on the nerve root. You may not notice any pain relief for at least one week.  If effective, we will often do a series of 3 injections spaced 3-6 weeks apart to maximally decrease your pain.    If you have any questions, please call [phone] [facility]

## 2015-05-14 NOTE — Progress Notes (Signed)
Safety precautions to be maintained throughout the outpatient stay will include: orient to surroundings, keep bed in low position, maintain call bell within reach at all times, provide assistance with transfer out of bed and ambulation.  

## 2015-05-20 DIAGNOSIS — K629 Disease of anus and rectum, unspecified: Secondary | ICD-10-CM | POA: Insufficient documentation

## 2015-05-20 NOTE — Progress Notes (Signed)
This encounter was created in error - please disregard.

## 2015-05-21 ENCOUNTER — Other Ambulatory Visit: Payer: Self-pay | Admitting: Pain Medicine

## 2015-06-15 ENCOUNTER — Ambulatory Visit: Payer: Medicare PPO | Attending: Pain Medicine | Admitting: Pain Medicine

## 2015-06-15 ENCOUNTER — Encounter: Payer: Self-pay | Admitting: Pain Medicine

## 2015-06-15 VITALS — BP 157/83 | HR 53 | Temp 96.7°F | Resp 16 | Ht 66.0 in | Wt 247.0 lb

## 2015-06-15 DIAGNOSIS — M172 Bilateral post-traumatic osteoarthritis of knee: Secondary | ICD-10-CM

## 2015-06-15 DIAGNOSIS — M533 Sacrococcygeal disorders, not elsewhere classified: Secondary | ICD-10-CM

## 2015-06-15 DIAGNOSIS — M150 Primary generalized (osteo)arthritis: Secondary | ICD-10-CM

## 2015-06-15 DIAGNOSIS — M170 Bilateral primary osteoarthritis of knee: Secondary | ICD-10-CM

## 2015-06-15 DIAGNOSIS — M160 Bilateral primary osteoarthritis of hip: Secondary | ICD-10-CM

## 2015-06-15 DIAGNOSIS — M1288 Other specific arthropathies, not elsewhere classified, other specified site: Secondary | ICD-10-CM | POA: Insufficient documentation

## 2015-06-15 DIAGNOSIS — M19011 Primary osteoarthritis, right shoulder: Secondary | ICD-10-CM

## 2015-06-15 DIAGNOSIS — M5126 Other intervertebral disc displacement, lumbar region: Secondary | ICD-10-CM | POA: Diagnosis not present

## 2015-06-15 DIAGNOSIS — M461 Sacroiliitis, not elsewhere classified: Secondary | ICD-10-CM

## 2015-06-15 DIAGNOSIS — M159 Polyosteoarthritis, unspecified: Secondary | ICD-10-CM

## 2015-06-15 DIAGNOSIS — M5136 Other intervertebral disc degeneration, lumbar region: Secondary | ICD-10-CM | POA: Diagnosis not present

## 2015-06-15 DIAGNOSIS — M19012 Primary osteoarthritis, left shoulder: Secondary | ICD-10-CM

## 2015-06-15 DIAGNOSIS — M47817 Spondylosis without myelopathy or radiculopathy, lumbosacral region: Secondary | ICD-10-CM

## 2015-06-15 DIAGNOSIS — M79604 Pain in right leg: Secondary | ICD-10-CM | POA: Diagnosis present

## 2015-06-15 DIAGNOSIS — M5416 Radiculopathy, lumbar region: Secondary | ICD-10-CM

## 2015-06-15 DIAGNOSIS — M5137 Other intervertebral disc degeneration, lumbosacral region: Secondary | ICD-10-CM

## 2015-06-15 DIAGNOSIS — Z96653 Presence of artificial knee joint, bilateral: Secondary | ICD-10-CM

## 2015-06-15 DIAGNOSIS — M545 Low back pain: Secondary | ICD-10-CM | POA: Diagnosis present

## 2015-06-15 DIAGNOSIS — M7060 Trochanteric bursitis, unspecified hip: Secondary | ICD-10-CM

## 2015-06-15 DIAGNOSIS — M79605 Pain in left leg: Secondary | ICD-10-CM | POA: Diagnosis present

## 2015-06-15 MED ORDER — BUPIVACAINE HCL (PF) 0.25 % IJ SOLN
INTRAMUSCULAR | Status: AC
  Administered 2015-06-15: 11:00:00
  Filled 2015-06-15: qty 30

## 2015-06-15 MED ORDER — MIDAZOLAM HCL 5 MG/5ML IJ SOLN
INTRAMUSCULAR | Status: AC
  Administered 2015-06-15: 3 mg via INTRAVENOUS
  Filled 2015-06-15: qty 5

## 2015-06-15 MED ORDER — ORPHENADRINE CITRATE 30 MG/ML IJ SOLN
INTRAMUSCULAR | Status: AC
  Administered 2015-06-15: 11:00:00
  Filled 2015-06-15: qty 2

## 2015-06-15 MED ORDER — CEFUROXIME AXETIL 250 MG PO TABS: 250.0000 mg | ORAL_TABLET | Freq: Two times a day (BID) | ORAL | Status: DC

## 2015-06-15 MED ORDER — TRIAMCINOLONE ACETONIDE 40 MG/ML IJ SUSP
INTRAMUSCULAR | Status: AC
  Administered 2015-06-15: 11:00:00
  Filled 2015-06-15: qty 1

## 2015-06-15 MED ORDER — FENTANYL CITRATE (PF) 100 MCG/2ML IJ SOLN
INTRAMUSCULAR | Status: AC
  Administered 2015-06-15: 50 ug via INTRAVENOUS
  Filled 2015-06-15: qty 2

## 2015-06-15 MED ORDER — CEFAZOLIN SODIUM 1 G IJ SOLR
INTRAMUSCULAR | Status: AC
Start: 2015-06-15 — End: 2015-06-15
  Administered 2015-06-15: 1 g via INTRAVENOUS
  Filled 2015-06-15: qty 10

## 2015-06-15 MED ORDER — OXYCODONE HCL 10 MG PO TABS: ORAL_TABLET | ORAL | Status: DC

## 2015-06-15 NOTE — Progress Notes (Signed)
Subjective:    Patient ID: Kimberly Shaffer, female    DOB: Mar 17, 1951, 64 y.o.   MRN: [id number]  HPI PROCEDURE PERFORMED: Lumbosacral selective nerve root block   NOTE: The patient is a 64 y.o. female who returns to [facility] for further evaluation and treatment of pain involving the lumbar and lower extremity region. Studies consisting of MRI has revealed the patient to be with evidence of degenerative disc disease lumbar spine with L1 to, L2-3, L3-4, L4-5, and L5-S1 with multilevel degenerative disc disease, disc bulging, broad-based disc bulging, left paracentral disc protrusion, superior margination of disc material, bilateral facet arthropathy, L1-2 and L2-3 and L3-4 with bilateral facet arthropathy as well as L4-5 and L5-S1. There is concern regarding intraspinal abnormalities contributing to patient's symptomatology with concern regarding significant component of pain due to radiculopathy. The risks, benefits, and expectations of the procedure have been explained to the patient who was understanding and in agreement with suggested treatment plan. We will proceed with interventional treatment as discussed and as explained to the patient. The patient is understanding and in agreement with suggested treatment plan.   DESCRIPTION OF PROCEDURE: Lumbosacral selective nerve root block with IV Versed, IV fentanyl conscious sedation, EKG, blood pressure, pulse, and pulse oximetry monitoring. The procedure was performed with the patient in the prone position under fluoroscopic guidance. With the patient in the prone position, Betadine prep of proposed entry site was performed. Local anesthetic skin wheal of proposed needle entry site was prepared with 1.5% plain lidocaine with AP view of the lumbosacral spine.   PROCEDURE #1: Needle placement at the right L 2 vertebral body: A 22 -gauge needle was inserted at the inferior border of the transverse process of the vertebral body with needle  placed medial to the midline of the transverse process on AP view of the lumbosacral spine.   NEEDLE PLACEMENT AT  L3, L4, and L5  VERTEBRAL BODY LEVELS  Needle  placement was accomplished at L3, L4, and L5  vertebral body levels on the right side exactly as was accomplished at the L2  vertebral body level  and utilizing the same technique and under fluoroscopic guidance.  PROCEDURE #4: Needle placement at the S1 foramen. With the patient in the prone position with Betadine prep of proposed entry site accomplished, the S1 foramen was visualized under fluoroscopic guidance with AP view of the lumbosacral spine with cephalad orientation of the fluoroscope with local anesthetic skin wheal of 1.5% lidocaine of proposed needle entry site prepared. A 22-gauge needle was inserted S1 foramen under fluoroscopic guidance eliciting paresthesias radiating from the buttocks to the lower extremity after which needle was slightly withdrawn.   Needle placement was then verified on lateral view at all levels with needle tip documented to be in the posterior superior quadrant of the intervertebral foramen of  L L2, L3, L4, and L5. Following negative aspiration for heme and CSF at each level, each level was injected with 3 mL of 0.25% bupivacaine with Kenalog.   Myoneural block injections of the lumbar paraspinal musculature region Following Betadine prep of proposed entry site a 22-gauge needle was inserted in the lumbar paraspinal musculature region and following negative aspiration a total of 2 cc of 0.25% bupivacaine with Norflex was injected for myoneural block injection 4    The patient tolerated the procedure well. A total of 10 mg of Kenalog was utilized for the procedure.   PLAN:  1. Medications: Will continue presently prescribed medication . Oxycodone  2. The patient is to undergo follow-up evaluation with PCP Dr.Abrill  for evaluation of blood pressure and general medical condition status post  procedure performed on today's visit. 3. Surgical follow-up evaluation as discussed  4. Neurological evaluation.May consider PN[facility] EMG studies and other studies  5. May consider radiofrequency procedures, implantation type procedures and other treatment pending response to treatment and follow-up evaluation. 6. The patient has been advise do adhere to proper body mechanics and avoid activities which may aggravate condition. 7. The patient has been advised to call the Pain Management Center prior to scheduled return appointment should there be significant change in the patient's condition or should the patient have other concerns regarding condition prior to scheduled return appointment.    Review of Systems     Objective:   Physical Exam        Assessment & Plan:

## 2015-06-15 NOTE — Progress Notes (Signed)
Safety precautions to be maintained throughout the outpatient stay will include: orient to surroundings, keep bed in low position, maintain call bell within reach at all times, provide assistance with transfer out of bed and ambulation.  

## 2015-06-15 NOTE — Patient Instructions (Addendum)
PLAN  Continue present medication oxycodone and begin taking antibiotic Ceftin as prescribed. Please obtain your antibiotic Ceftin today and begin taking antibiotic today  F/U PCP Dr.Abrill  for evaliation of  BP and general medical  condition.  F/U surgical evaluation. May consider pending follow-up evaluations  F/U neurological evaluation. May consider pending follow-up evaluations  F/U cardiology  May consider radiofrequency rhizolysis or intraspinal procedures pending response to present treatment and F/U evaluation.  Patient to call [facility] Center should patient have concerns prior to scheduled return appointment.  Pain Management Discharge Instructions  General Discharge Instructions :  If you need to reach your doctor call: Monday-Friday 8:00 am - 4:00 pm at [phone] or toll free [phone].  After clinic hours [phone] to have operator reach doctor.  Bring all of your medication bottles to all your appointments in the pain clinic.  To cancel or reschedule your appointment with Pain Management please remember to call 24 hours in advance to avoid a fee.  Refer to the educational materials which you have been given on: General Risks, I had my Procedure. Discharge Instructions, Post Sedation.  Post Procedure Instructions:  The drugs you were given will stay in your system until tomorrow, so for the next 24 hours you should not drive, make any legal decisions or drink any alcoholic beverages.  You may eat anything you prefer, but it is better to start with liquids then soups and crackers, and gradually work up to solid foods.  Please notify your doctor immediately if you have any unusual bleeding, trouble breathing or pain that is not related to your normal pain.  Depending on the type of procedure that was done, some parts of your body may feel week and/or numb.  This usually clears up by tonight or the next day.  Walk with the use of an assistive device  or accompanied by an adult for the 24 hours.  You may use ice on the affected area for the first 24 hours.  Put ice in a Ziploc bag and cover with a towel and place against area 15 minutes on 15 minutes off.  You may switch to heat after 24 hours.Selective Nerve Root Block Patient Information  Description: Specific nerve roots exit the spinal canal and these nerves can be compressed and inflamed by a bulging disc and bone spurs.  By injecting steroids on the nerve root, we can potentially decrease the inflammation surrounding these nerves, which often leads to decreased pain.  Also, by injecting local anesthesia on the nerve root, this can provide [place] helpful information to give to your referring doctor if it decreases your pain.  Selective nerve root blocks can be done along the spine from the neck to the low back depending on the location of your pain.   After numbing the skin with local anesthesia, a small needle is passed to the nerve root and the position of the needle is verified using x-ray pictures.  After the needle is in correct position, we then deposit the medication.  You may experience a pressure sensation while this is being done.  The entire block usually lasts less than 15 minutes.  Conditions that may be treated with selective nerve root blocks:  Low back and leg pain  Spinal stenosis  Diagnostic block prior to potential surgery  Neck and arm pain  Post laminectomy syndrome  Preparation for the injection:  1. Do not eat any solid food or dairy products within 6 hours of your appointment. 2.  You may drink clear liquids up to 2 hours before an appointment.  Clear liquids include water, black coffee, juice or soda.  No milk or cream please. 3. You may take your regular medications, including pain medications, with a sip of water before your appointment.  Diabetics should hold regular insulin (if taken separately) and take 1/2 normal NPH dose the morning of the procedure.  Carry  some sugar containing items with you to your appointment. 4. A driver must accompany you and be prepared to drive you home after your procedure. 5. Bring all your current medications with you. 6. An IV may be inserted and sedation may be given at the discretion of the physician. 7. A blood pressure cuff, EKG, and other monitors will often be applied during the procedure.  Some patients may need to have extra oxygen administered for a short period. 8. You will be asked to provide medical information, including allergies, prior to the procedure.  We must know immediately if you are taking blood  Thinners (like Coumadin) or if you are allergic to IV iodine contrast (dye).  Possible side-effects: All are usually temporary  Bleeding from needle site  Light headedness  Numbness and tingling  Decreased blood pressure  Weakness in arms/legs  Pressure sensation in back/neck  Pain at injection site (several days)  Possible complications: All are extremely rare  Infection  Nerve injury  Spinal headache (a headache wore with upright position)  Call if you experience:  Fever/chills associated with headache or increased back/neck pain  Headache worsened by an upright position  New onset weakness or numbness of an extremity below the injection site  Hives or difficulty breathing (go to the emergency room)  Inflammation or drainage at the injection site(s)  Severe back/neck pain greater than usual  New symptoms which are concerning to you  Please note:  Although the local anesthetic injected can often make your back or neck feel good for several hours after the injection the pain will likely return.  It takes 3-5 days for steroids to work on the nerve root. You may not notice any pain relief for at least one week.  If effective, we will often do a series of 3 injections spaced 3-6 weeks apart to maximally decrease your pain.    If you have any questions, please call  [phone] [facility]

## 2015-06-16 ENCOUNTER — Encounter: Payer: Medicare PPO | Admitting: Pain Medicine

## 2015-06-16 ENCOUNTER — Emergency Department
Admission: EM | Admit: 2015-06-16 | Discharge: 2015-06-16 | Disposition: A | Discharge status: Home / Self Care | Payer: Medicare PPO | Attending: Emergency Medicine | Admitting: Emergency Medicine

## 2015-06-16 ENCOUNTER — Encounter: Payer: Self-pay | Admitting: Emergency Medicine

## 2015-06-16 ENCOUNTER — Other Ambulatory Visit: Payer: Self-pay

## 2015-06-16 ENCOUNTER — Emergency Department: Payer: Medicare PPO

## 2015-06-16 ENCOUNTER — Telehealth: Payer: Self-pay | Admitting: *Deleted

## 2015-06-16 DIAGNOSIS — Z7982 Long term (current) use of aspirin: Secondary | ICD-10-CM | POA: Insufficient documentation

## 2015-06-16 DIAGNOSIS — E119 Type 2 diabetes mellitus without complications: Secondary | ICD-10-CM | POA: Diagnosis not present

## 2015-06-16 DIAGNOSIS — R079 Chest pain, unspecified: Secondary | ICD-10-CM | POA: Diagnosis present

## 2015-06-16 DIAGNOSIS — Z79899 Other long term (current) drug therapy: Secondary | ICD-10-CM | POA: Diagnosis not present

## 2015-06-16 DIAGNOSIS — Z951 Presence of aortocoronary bypass graft: Secondary | ICD-10-CM | POA: Insufficient documentation

## 2015-06-16 DIAGNOSIS — I509 Heart failure, unspecified: Secondary | ICD-10-CM | POA: Diagnosis not present

## 2015-06-16 DIAGNOSIS — I10 Essential (primary) hypertension: Secondary | ICD-10-CM | POA: Diagnosis not present

## 2015-06-16 DIAGNOSIS — Z9861 Coronary angioplasty status: Secondary | ICD-10-CM | POA: Diagnosis not present

## 2015-06-16 DIAGNOSIS — Z720 Tobacco use: Secondary | ICD-10-CM | POA: Insufficient documentation

## 2015-06-16 LAB — TROPONIN I: Troponin I: <0.03 ng/mL (ref <0.031)

## 2015-06-16 LAB — COMPREHENSIVE METABOLIC PANEL
ALK PHOS: 82 U/L (ref 38–126)
ALT: 11 U/L — AB (ref 14–54)
AST: 18 U/L (ref 15–41)
Albumin: 3.6 g/dL (ref 3.5–5.0)
Anion gap: 3 — ABNORMAL LOW (ref 5–15)
BILIRUBIN TOTAL: 0.6 mg/dL (ref 0.3–1.2)
BUN: 59 mg/dL — AB (ref 6–20)
CHLORIDE: 114 mmol/L — AB (ref 101–111)
CO2: 22 mmol/L (ref 22–32)
CREATININE: 4.31 mg/dL — AB (ref 0.44–1.00)
Calcium: 9.3 mg/dL (ref 8.9–10.3)
GFR calc Af Amer: 12 mL/min — ABNORMAL LOW (ref >60)
GFR, EST NON AFRICAN AMERICAN: 10 mL/min — AB (ref >60)
Glucose, Bld: 163 mg/dL — ABNORMAL HIGH (ref 65–99)
Potassium: 4.5 mmol/L (ref 3.5–5.1)
Sodium: 139 mmol/L (ref 135–145)
Total Protein: 7.7 g/dL (ref 6.5–8.1)

## 2015-06-16 LAB — BRAIN NATRIURETIC PEPTIDE: B Natriuretic Peptide: 243 pg/mL — ABNORMAL HIGH (ref 0.0–100.0)

## 2015-06-16 LAB — CBC
HEMATOCRIT: 28.4 % — AB (ref 35.0–47.0)
HEMOGLOBIN: 9 g/dL — AB (ref 12.0–16.0)
MCH: 27.6 pg (ref 26.0–34.0)
MCHC: 31.6 g/dL — ABNORMAL LOW (ref 32.0–36.0)
MCV: 87.4 fL (ref 80.0–100.0)
Platelets: 185 10*3/uL (ref 150–440)
RBC: 3.25 MIL/uL — AB (ref 3.80–5.20)
RDW: 15.7 % — ABNORMAL HIGH (ref 11.5–14.5)
WBC: 9 10*3/uL (ref 3.6–11.0)

## 2015-06-16 MED ORDER — FUROSEMIDE 20 MG PO TABS: 20.0000 mg | ORAL_TABLET | Freq: Every day | ORAL | Status: DC

## 2015-06-16 MED ORDER — FUROSEMIDE 40 MG PO TABS: 40.0000 mg | ORAL_TABLET | Freq: Once | ORAL | Status: DC

## 2015-06-16 MED ORDER — FUROSEMIDE 40 MG PO TABS
20.0000 mg | ORAL_TABLET | Freq: Once | ORAL | Status: AC
  Administered 2015-06-16: 20 mg via ORAL
  Filled 2015-06-16: qty 1
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
## 2015-06-16 NOTE — Discharge Instructions (Signed)
Please follow up closely with Dr. Nehemiah Massed, please call him today to set up an appointment this week.  Chest Pain Observation It is often hard to give a specific diagnosis for the cause of chest pain. Among other possibilities your symptoms might be caused by inadequate oxygen delivery to your heart (angina). Angina that is not treated or evaluated can lead to a heart attack (myocardial infarction) or death. Blood tests, electrocardiograms, and X-rays may have been done to help determine a possible cause of your chest pain. After evaluation and observation, your health care provider has determined that it is unlikely your pain was caused by an unstable condition that requires hospitalization. However, a full evaluation of your pain may need to be completed, with additional diagnostic testing as directed. It is very important to keep your follow-up appointments. Not keeping your follow-up appointments could result in permanent heart damage, disability, or death. If there is any problem keeping your follow-up appointments, you must call your health care provider. HOME CARE INSTRUCTIONS  Due to the slight chance that your pain could be angina, it is important to follow your health care provider's treatment plan and also maintain a healthy lifestyle:  Maintain or work toward achieving a healthy weight.  Stay physically active and exercise regularly.  Decrease your salt intake.  Eat a balanced, healthy diet. Talk to a dietitian to learn about heart-healthy foods.  Increase your fiber intake by including whole grains, vegetables, fruits, and nuts in your diet.  Avoid situations that cause stress, anger, or depression.  Take medicines as advised by your health care provider. Report any side effects to your health care provider. Do not stop medicines or adjust the dosages on your own.  Quit smoking. Do not use nicotine patches or gum until you check with your health care provider.  Keep your blood  pressure, blood sugar, and cholesterol levels within normal limits.  Limit alcohol intake to no more than 1 drink per day for women who are not pregnant and 2 drinks per day for men.  Do not abuse drugs. SEEK IMMEDIATE MEDICAL CARE IF: You have severe chest pain or pressure which may include symptoms such as:  You feel pain or pressure in your arms, neck, jaw, or back.  You have severe back or abdominal pain, feel sick to your stomach (nauseous), or throw up (vomit).  You are sweating profusely.  You are having a fast or irregular heartbeat.  You feel short of breath while at rest.  You notice increasing shortness of breath during rest, sleep, or with activity.  You have chest pain that does not get better after rest or after taking your usual medicine.  You wake from sleep with chest pain.  You are unable to sleep because you cannot breathe.  You develop a frequent cough or you are coughing up blood.  You feel dizzy, faint, or experience extreme fatigue.  You develop severe weakness, dizziness, fainting, or chills. Any of these symptoms may represent a serious problem that is an emergency. Do not wait to see if the symptoms will go away. Call your local emergency services (911 in the [place] Do not drive yourself to the hospital. MAKE SURE YOU:  Understand these instructions.  Will watch your condition.  Will get help right away if you are not doing well or get worse. Document Released: 10/08/2010 Document Revised: 09/10/2013 Document Reviewed: 03/07/2013 [facility] Patient Information 2015 [place], [place]. This information is not intended to replace advice given to you  by your health care provider. Make sure you discuss any questions you have with your health care provider. ° °
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
## 2015-06-16 NOTE — Telephone Encounter (Signed)
Family member states that patient went to ED for chest pain last night. Called to ED and spoke with patients nurse Cloyde Reams) she stated that patient was going to be discharged today. Dr. Primus Bravo aware.

## 2015-06-16 NOTE — ED Provider Notes (Signed)
Patient reports she feels much improved, currently no pain. As discussed previously with Dr. Dahlia Client, I'll place the patient on a very low-dose of Lasix for the next few days and have her follow up closely with Dr. Nehemiah Massed. The patient is very calm, very amicable and in no distress. Lungs are clear, no hypoxia. She understands that she has severe kidney disease and has close follow-up October 5 plan for this. Return precautions advised.  Delman Kitten, MD 06/16/15 [phone]

## 2015-07-02 ENCOUNTER — Emergency Department
Admission: EM | Admit: 2015-07-02 | Discharge: 2015-07-03 | Disposition: A | Discharge status: Home / Self Care | Payer: Medicare HMO | Attending: Internal Medicine | Admitting: Internal Medicine

## 2015-07-02 ENCOUNTER — Emergency Department: Payer: Medicare HMO

## 2015-07-02 ENCOUNTER — Encounter: Payer: Self-pay | Admitting: General Practice

## 2015-07-02 DIAGNOSIS — N289 Disorder of kidney and ureter, unspecified: Secondary | ICD-10-CM | POA: Insufficient documentation

## 2015-07-02 DIAGNOSIS — I10 Essential (primary) hypertension: Secondary | ICD-10-CM | POA: Insufficient documentation

## 2015-07-02 DIAGNOSIS — R531 Weakness: Secondary | ICD-10-CM

## 2015-07-02 DIAGNOSIS — Z79899 Other long term (current) drug therapy: Secondary | ICD-10-CM | POA: Insufficient documentation

## 2015-07-02 DIAGNOSIS — I252 Old myocardial infarction: Secondary | ICD-10-CM | POA: Insufficient documentation

## 2015-07-02 DIAGNOSIS — G459 Transient cerebral ischemic attack, unspecified: Secondary | ICD-10-CM

## 2015-07-02 DIAGNOSIS — I6782 Cerebral ischemia: Secondary | ICD-10-CM | POA: Insufficient documentation

## 2015-07-02 LAB — BASIC METABOLIC PANEL
Anion gap: 9 (ref 5–15)
BUN: 59 mg/dL — ABNORMAL HIGH (ref 6–20)
CHLORIDE: 110 mmol/L (ref 101–111)
CO2: 19 mmol/L — AB (ref 22–32)
CREATININE: 4.82 mg/dL — AB (ref 0.44–1.00)
Calcium: 9.6 mg/dL (ref 8.9–10.3)
GFR calc non Af Amer: 9 mL/min — ABNORMAL LOW (ref >60)
GFR, EST AFRICAN AMERICAN: 10 mL/min — AB (ref >60)
Glucose, Bld: 120 mg/dL — ABNORMAL HIGH (ref 65–99)
Potassium: 4.1 mmol/L (ref 3.5–5.1)
Sodium: 138 mmol/L (ref 135–145)

## 2015-07-02 LAB — GLUCOSE, CAPILLARY: Glucose-Capillary: 102 mg/dL — ABNORMAL HIGH (ref 65–99)

## 2015-07-02 LAB — CBC
HCT: 30.5 % — ABNORMAL LOW (ref 35.0–47.0)
HEMOGLOBIN: 9.9 g/dL — AB (ref 12.0–16.0)
MCH: 28.2 pg (ref 26.0–34.0)
MCHC: 32.3 g/dL (ref 32.0–36.0)
MCV: 87.1 fL (ref 80.0–100.0)
PLATELETS: 198 10*3/uL (ref 150–440)
RBC: 3.5 MIL/uL — AB (ref 3.80–5.20)
RDW: 16.1 % — ABNORMAL HIGH (ref 11.5–14.5)
WBC: 9.6 10*3/uL (ref 3.6–11.0)

## 2015-07-02 MED ORDER — ALBUTEROL SULFATE (2.5 MG/3ML) 0.083% IN NEBU
5.0000 mg | INHALATION_SOLUTION | Freq: Once | RESPIRATORY_TRACT | Status: AC
Start: 2015-07-02 — End: 2015-07-02
  Administered 2015-07-02: 5 mg via RESPIRATORY_TRACT
  Filled 2015-07-02: qty 6

## 2015-07-02 MED ORDER — ASPIRIN 81 MG PO CHEW
324.0000 mg | CHEWABLE_TABLET | Freq: Once | ORAL | Status: AC
  Administered 2015-07-03: 324 mg via ORAL
  Filled 2015-07-02: qty 4

## 2015-07-02 NOTE — ED Notes (Addendum)
Pt to ED with daughter c/o shortness of breath, generalized weakness and intermittent vision loss that has since resolved. Pt denies any one-sided extremety weakness, facial expressions are symmetrical. Pt is speaking clearly and no loss of memory.

## 2015-07-02 NOTE — ED Provider Notes (Signed)
[facility] Emergency Department Provider Note  ____________________________________________  Time seen: Approximately 11:40 PM  I have reviewed the triage vital signs and the nursing notes.   HISTORY  Chief Complaint Weakness; Loss of Vision; and Shortness of Breath  History obtained by daughter  HPI Kimberly Shaffer is a 64 y.o. female who presents to the ED from home via EMS with a chief complaint of generalized weakness, intermittent vision loss and shortness of breath. Daughter blames all of patient's symptoms on eating a pork chop. States after patient ate a pork chop, patient drove her son to work; upon her return home, patient was unable to get out of her car secondary to generalized weakness. Patient states she feels weaker on her right side. Time of onset approximately 5 PM. States her vision became so blurred that she felt like she lost her vision in both eyes temporarily. Daughter states patient's speech was slurred and they could not get her out of the car. Patient denies recent fever, chills, chest pain, abdominal pain, nausea, vomiting, diarrhea. States she felt dizzy during the episode without headache. Denies recent fall, trauma or travel.   Past Medical History  Diagnosis Date  . Myocardial infarction ([place])   . COPD (chronic obstructive pulmonary disease) ([place])   . Hypertension   . Obesity   . Depression   . Hypercholesteremia   . H/O blood clots   . Stroke ([place])   . H/O angina pectoris   . CAD (coronary artery disease)   . Diabetes mellitus without complication [facility])     NIDD    Patient Active Problem List   Diagnosis Date Noted  . 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 Lumbar radiculopathy 05/14/2015  . 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 Lumbosacral facet joint syndrome 02/16/2015  . Greater trochanteric bursitis 02/16/2015  . DDD (degenerative disc disease), lumbosacral 01/20/2015  . 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 DJD (degenerative joint disease) shoulder 01/20/2015    Past Surgical History  Procedure Laterality Date  . Cardiac catheterization    . Coronary stent placement    . Knee surgery Bilateral   . Replacement total knee bilateral Bilateral   . Coronary artery bypass graft  2012  . Joint replacement      Current Outpatient Rx  Name  Route  Sig  Dispense  Refill  . albuterol (PROVENTIL HFA;VENTOLIN HFA) 108 (90 BASE) MCG/ACT inhaler   Inhalation   Inhale into the lungs every 4 (four) hours as needed for wheezing or shortness of breath. 2-4 puffs         . amLODipine (NORVASC) 10 MG tablet   Oral   Take 10 mg by mouth daily.         Marland Kitchen aspirin 81 MG tablet   Oral   Take 81 mg by mouth daily.         Marland Kitchen atorvastatin (LIPITOR) 80 MG tablet   Oral   Take 80 mg by mouth daily.         . cefUROXime (CEFTIN) 250 MG tablet   Oral   Take 1 tablet (250 mg total) by mouth 2 (two) times daily with a meal. Patient not taking: Reported on 05/14/2015   14 tablet   0   . cefUROXime (CEFTIN) 250 MG tablet   Oral   Take 1 tablet (250 mg total) by mouth 2 (two) times daily with a meal.   14 tablet  0   . Cholecalciferol (VITAMIN D3) 5000 UNITS CAPS   Oral   Take 1 tablet by mouth daily.         . DULoxetine (CYMBALTA) 60 MG capsule   Oral   Take 60 mg by mouth daily.         Marland Kitchen FOLIC ACID PO   Oral   Take by mouth daily.         . furosemide (LASIX) 20 MG tablet   Oral   Take 1 tablet (20 mg total) by mouth daily.   2 tablet   0   . Garlic 123XX123 MG TABS   Oral   Take 1 tablet by mouth daily.         Marland Kitchen lisinopril (PRINIVIL,ZESTRIL) 10 MG tablet   Oral   Take 10 mg by mouth daily.         Marland Kitchen lubiprostone (AMITIZA) 24 MCG capsule   Oral   Take 24 mcg by mouth daily as needed for constipation.         . meclizine (ANTIVERT) 25 MG tablet   Oral   Take 25 mg by mouth 3 (three) times daily as needed for dizziness.          . metoprolol succinate (TOPROL-XL) 100 MG 24 hr tablet   Oral   Take 100 mg by mouth daily. Take with or immediately following a meal.         . Multiple Vitamins-Minerals (ONE-A-DAY WOMENS 50 PLUS PO)   Oral   Take 1 tablet by mouth daily.         . nitroGLYCERIN (NITROLINGUAL) 0.4 MG/SPRAY spray   Sublingual   Place 1 spray under the tongue every 5 (five) minutes x 3 doses as needed for chest pain.         . Oxycodone HCl 10 MG TABS      Limit  1  tab po bid - qid if tolerated daily   120 tablet   0   . pantoprazole (PROTONIX) 40 MG tablet   Oral   Take 40 mg by mouth 2 (two) times daily.         . Pramoxine-Dimethicone 1-6 % CREA   Apply externally   Apply topically.         . sodium bicarbonate 650 MG tablet   Oral   Take 650 mg by mouth 2 (two) times daily.           Allergies Nystatin and Sulfa antibiotics  Family History  Problem Relation Age of Onset  . Cancer Mother   . Cancer Father   . Diabetes Brother   . Heart disease Brother     Social History Social History  Substance Use Topics  . Smoking status: Current Every Day Smoker -- 0.50 packs/day    Types: Cigarettes  . Smokeless tobacco: None  . Alcohol Use: No    Review of Systems Constitutional: Positive for generalized weakness. No fever/chills Eyes: Positive for visual changes. ENT: No sore throat. Cardiovascular: Denies chest pain. Respiratory: Positive for shortness of breath. Gastrointestinal: No abdominal pain.  No nausea, no vomiting.  No diarrhea.  No constipation. Genitourinary: Negative for dysuria. Musculoskeletal: Negative for back pain. Skin: Negative for rash. Neurological: Negative for headaches, focal weakness or numbness.  10-point ROS otherwise negative.  ____________________________________________   PHYSICAL EXAM:  VITAL SIGNS: ED Triage Vitals  Enc Vitals Group     BP 07/02/15 1915 174/85 mmHg     Pulse  Rate 07/02/15 1915 70     Resp  07/02/15 1915 20     Temp 07/02/15 1915 97.9 F (36.6 C)     Temp Source 07/02/15 1915 Oral     SpO2 07/02/15 1915 96 %     Weight 07/02/15 1915 242 lb (109.77 kg)     Height 07/02/15 1915 5\' 6"  (1.676 m)     Head Cir --      Peak Flow --      Pain Score 07/02/15 1918 0     Pain Loc --      Pain Edu? --      Excl. in [place]? --     Constitutional: Somnolent. Well appearing and in mild acute distress. Eyes: Conjunctivae are normal. PERRL. EOMI. Head: Atraumatic. Nose: No congestion/rhinnorhea. Mouth/Throat: Mucous membranes are moist.  Oropharynx non-erythematous. Neck: No stridor. No carotid bruits. Cardiovascular: Normal rate, regular rhythm. Grossly normal heart sounds.  Good peripheral circulation. Respiratory: Normal respiratory effort.  No retractions. Lungs CTAB. Gastrointestinal: Soft and nontender. No distention. No abdominal bruits. No CVA tenderness. Musculoskeletal: No lower extremity tenderness nor edema.  No joint effusions. Neurologic:  Slow speech and language. CN II-12 grossly intact. Moves all extremities 4. 4/5 motor strength right upper and right lower extremity. Skin:  Skin is warm, dry and intact. No rash noted. Psychiatric: Mood and affect are normal. Speech and behavior are normal.  ____________________________________________   LABS (all labs ordered are listed, but only abnormal results are displayed)  Labs Reviewed  BASIC METABOLIC PANEL - Abnormal; Notable for the following:    CO2 19 (*)    Glucose, Bld 120 (*)    BUN 59 (*)    Creatinine, Ser 4.82 (*)    GFR calc non Af Amer 9 (*)    GFR calc Af Amer 10 (*)    All other components within normal limits  CBC - Abnormal; Notable for the following:    RBC 3.50 (*)    Hemoglobin 9.9 (*)    HCT 30.5 (*)    RDW 16.1 (*)    All other components within normal limits  GLUCOSE, CAPILLARY - Abnormal; Notable for the following:    Glucose-Capillary 102 (*)    All other components within normal limits    ____________________________________________  EKG  ED ECG REPORT I, Asjah Rauda J, the attending physician, personally viewed and interpreted this ECG.   Date: 07/02/2015  EKG Time: 1911  Rate: 71  Rhythm: normal EKG, normal sinus rhythm  Axis: Normal  Intervals:none  ST&T Change: Inverted T waves laterally  ____________________________________________  RADIOLOGY  CT head without contrast interpreted per Dr. Melanee Spry: No evidence of acute intracranial abnormality.  Chronic small-vessel white matter ischemic changes and remote bilateral basal ganglia and thalamic infarcts. ___________________________________________   PROCEDURES  Procedure(s) performed: None  Critical Care performed: No  ____________________________________________   INITIAL IMPRESSION / ASSESSMENT AND PLAN / ED COURSE  Pertinent labs & imaging results that were available during my care of the patient were reviewed by me and considered in my medical decision making (see chart for details).  64 year old female with worsening kidney disease who presents with symptoms consistent with TIA. No intracranial hemorrhage seen on head CT. Will administer aspirin; discuss with hospitalist for admission. ____________________________________________   FINAL CLINICAL IMPRESSION(S) / ED DIAGNOSES  Final diagnoses:  Weakness  Transient cerebral ischemia, unspecified transient cerebral ischemia type  Renal insufficiency      Paulette Blanch, MD 07/03/15 0005

## 2015-07-03 ENCOUNTER — Emergency Department: Payer: Medicare HMO

## 2015-07-03 DIAGNOSIS — I6782 Cerebral ischemia: Secondary | ICD-10-CM | POA: Diagnosis not present

## 2015-07-03 MED ORDER — ONDANSETRON HCL 4 MG/2ML IJ SOLN
INTRAMUSCULAR | Status: AC
  Filled 2015-07-03: qty 2

## 2015-07-03 MED ORDER — CYCLOBENZAPRINE HCL 10 MG PO TABS
5.0000 mg | ORAL_TABLET | Freq: Once | ORAL | Status: AC
  Administered 2015-07-03: 5 mg via ORAL
  Filled 2015-07-03: qty 1

## 2015-07-03 MED ORDER — MORPHINE SULFATE (PF) 4 MG/ML IV SOLN
INTRAVENOUS | Status: AC
  Filled 2015-07-03: qty 1

## 2015-07-03 MED ORDER — CYCLOBENZAPRINE HCL 5 MG PO TABS: 5.0000 mg | ORAL_TABLET | Freq: Three times a day (TID) | ORAL | Status: DC | PRN

## 2015-07-03 NOTE — Consult Note (Signed)
[facility] [place] NAME: Kimberly Shaffer    MR#:  [id number]  DATE OF BIRTH:  Jan 27, 1951  DATE OF ADMISSION:  07/02/2015  PRIMARY CARE PHYSICIAN: [facility]  REQUESTING/REFERRING PHYSICIAN: Dr. sung  CHIEF COMPLAINT:   Chief Complaint  Patient presents with  . Weakness  . Loss of Vision  . Shortness of Breath    HISTORY OF PRESENT ILLNESS:  Kimberly Shaffer  is a 64 y.o. female with a known history of CK D stage IV, hypertension, obesity, COPD with ongoing tobacco abuse comes in the emergency room after she had some symptoms of dizziness and blurred vision with generalized weakness this afternoon. She is accompanied by her daughter in the emergency room. Patient during my evaluation was fast asleep she woke up she started having significant amount of left thigh cramps. She walked around in the emergency room in the room with me without any focal weakness. Her cramps settled down. She denies any further weakness and tells me she will would like to go home. Per ER physician it seemed like patient probably had symptoms of TIA. She takes aspirin 81 mg on a daily basis. Her speech is clear. Vision is back to normal. And denies any focal weakness at present.  PAST MEDICAL HISTORY:   Past Medical History  Diagnosis Date  . Myocardial infarction ([place])   . COPD (chronic obstructive pulmonary disease) ([place])   . Hypertension   . Obesity   . Depression   . Hypercholesteremia   . H/O blood clots   . Stroke ([place])   . H/O angina pectoris   . CAD (coronary artery disease)   . Diabetes mellitus without complication ([place])     NIDD    PAST SURGICAL HISTOIRY:   Past Surgical History  Procedure Laterality Date  . Cardiac catheterization    . Coronary stent placement    . Knee surgery Bilateral   . Replacement total knee bilateral Bilateral   . Coronary artery bypass graft  2012  . Joint replacement      SOCIAL HISTORY:   Social History   Substance Use Topics  . Smoking status: Current Every Day Smoker -- 0.50 packs/day    Types: Cigarettes  . Smokeless tobacco: Not on file  . Alcohol Use: No    FAMILY HISTORY:   Family History  Problem Relation Age of Onset  . Cancer Mother   . Cancer Father   . Diabetes Brother   . Heart disease Brother     DRUG ALLERGIES:   Allergies  Allergen Reactions  . Nystatin Hives and Itching  . Sulfa Antibiotics Swelling and Hives    REVIEW OF SYSTEMS:   Review of Systems  Constitutional: Negative for fever, chills and weight loss.  HENT: Negative for ear discharge, ear pain and nosebleeds.   Eyes: Negative for blurred vision, pain and discharge.  Respiratory: Negative for sputum production, shortness of breath, wheezing and stridor.   Cardiovascular: Negative for chest pain, palpitations, orthopnea and PND.  Gastrointestinal: Negative for nausea, vomiting, abdominal pain and diarrhea.  Genitourinary: Negative for urgency and frequency.  Musculoskeletal: Positive for myalgias. Negative for back pain and joint pain.  Neurological: Positive for weakness. Negative for sensory change, speech change and focal weakness.  Psychiatric/Behavioral: Negative for depression and hallucinations. The patient is not nervous/anxious.     MEDICATIONS AT HOME:   Prior to Admission medications   Medication Sig Start Date End Date Taking? Authorizing Provider  albuterol (PROVENTIL HFA;VENTOLIN HFA) 108 (90 BASE) MCG/ACT inhaler Inhale into the lungs every 4 (four) hours as needed for wheezing or shortness of breath. 2-4 puffs    Historical Provider, MD  amLODipine (NORVASC) 10 MG tablet Take 10 mg by mouth daily.    Historical Provider, MD  aspirin 81 MG tablet Take 81 mg by mouth daily.    Historical Provider, MD  atorvastatin (LIPITOR) 80 MG tablet Take 80 mg by mouth daily.    Historical Provider, MD  cefUROXime (CEFTIN) 250 MG tablet Take 1 tablet (250 mg total) by mouth 2 (two) times daily  with a meal. Patient not taking: Reported on 05/14/2015 04/29/15   Mohammed Kindle, MD  cefUROXime (CEFTIN) 250 MG tablet Take 1 tablet (250 mg total) by mouth 2 (two) times daily with a meal. 06/15/15   Mohammed Kindle, MD  Cholecalciferol (VITAMIN D3) 5000 UNITS CAPS Take 1 tablet by mouth daily.    Historical Provider, MD  DULoxetine (CYMBALTA) 60 MG capsule Take 60 mg by mouth daily.    Historical Provider, MD  FOLIC ACID PO Take by mouth daily.    Historical Provider, MD  furosemide (LASIX) 20 MG tablet Take 1 tablet (20 mg total) by mouth daily. 06/16/15 06/15/16  Delman Kitten, MD  Garlic 123XX123 MG TABS Take 1 tablet by mouth daily.    Historical Provider, MD  lisinopril (PRINIVIL,ZESTRIL) 10 MG tablet Take 10 mg by mouth daily.    Historical Provider, MD  lubiprostone (AMITIZA) 24 MCG capsule Take 24 mcg by mouth daily as needed for constipation.    Historical Provider, MD  meclizine (ANTIVERT) 25 MG tablet Take 25 mg by mouth 3 (three) times daily as needed for dizziness.    Historical Provider, MD  metoprolol succinate (TOPROL-XL) 100 MG 24 hr tablet Take 100 mg by mouth daily. Take with or immediately following a meal.    Historical Provider, MD  Multiple Vitamins-Minerals (ONE-A-DAY WOMENS 50 PLUS PO) Take 1 tablet by mouth daily.    Historical Provider, MD  nitroGLYCERIN (NITROLINGUAL) 0.4 MG/SPRAY spray Place 1 spray under the tongue every 5 (five) minutes x 3 doses as needed for chest pain.    Historical Provider, MD  Oxycodone HCl 10 MG TABS Limit  1  tab po bid - qid if tolerated daily 06/15/15   Mohammed Kindle, MD  pantoprazole (PROTONIX) 40 MG tablet Take 40 mg by mouth 2 (two) times daily.    Historical Provider, MD  Pramoxine-Dimethicone 1-6 % CREA Apply topically.    Historical Provider, MD  sodium bicarbonate 650 MG tablet Take 650 mg by mouth 2 (two) times daily.    Historical Provider, MD      VITAL SIGNS:  Blood pressure 137/58, pulse 73, temperature 97.9 F (36.6 C), temperature  source Oral, resp. rate 20, height 5\' 6"  (1.676 m), weight 109.77 kg (242 lb), SpO2 100 %.  PHYSICAL EXAMINATION:  GENERAL:  64 y.o.-year-old patient lying in the bed with no acute distress.  EYES: Pupils equal, round, reactive to light and accommodation. No scleral icterus. Extraocular muscles intact.  HEENT: Head atraumatic, normocephalic. Oropharynx and nasopharynx clear.  NECK:  Supple, no jugular venous distention. No thyroid enlargement, no tenderness.  LUNGS: Normal breath sounds bilaterally, no wheezing, rales,rhonchi or crepitation. No use of accessory muscles of respiration.  CARDIOVASCULAR: S1, S2 normal. No murmurs, rubs, or gallops.  ABDOMEN: Soft, nontender, nondistended. Bowel sounds present. No organomegaly or mass.  EXTREMITIES: No pedal edema, cyanosis, or clubbing.  NEUROLOGIC: Cranial nerves II through XII are intact. Muscle strength 5/5 in all extremities. Sensation intact. Gait not checked. No focal weakness. Speech clear. PSYCHIATRIC: The patient is alert and oriented x 3.  SKIN: No obvious rash, lesion, or ulcer.   LABORATORY PANEL:   CBC  Recent Labs Lab 07/02/15 1922  WBC 9.6  HGB 9.9*  HCT 30.5*  PLT 198   ------------------------------------------------------------------------------------------------------------------  Chemistries   Recent Labs Lab 07/02/15 1922  NA 138  K 4.1  CL 110  CO2 19*  GLUCOSE 120*  BUN 59*  CREATININE 4.82*  CALCIUM 9.6   ------------------------------------------------------------------------------------------------------------------  Cardiac Enzymes No results for input(s): TROPONINI in the last 168 hours. ------------------------------------------------------------------------------------------------------------------  RADIOLOGY:  Ct Head Wo Contrast  07/02/2015  CLINICAL DATA:  64 year old female with generalized weakness and intermittent vision loss. EXAM: CT HEAD WITHOUT CONTRAST TECHNIQUE: Contiguous  axial images were obtained from the base of the skull through the vertex without intravenous contrast. COMPARISON:  11/13/2014 and prior exams FINDINGS: Chronic small-vessel white matter ischemic changes and remote bilateral basal ganglia and thalamic infarcts noted. No acute intracranial abnormalities are identified, including mass lesion or mass effect, hydrocephalus, extra-axial fluid collection, midline shift, hemorrhage, or acute infarction. The visualized bony calvarium is unremarkable. IMPRESSION: No evidence of acute intracranial abnormality. Chronic small-vessel white matter ischemic changes and remote bilateral basal ganglia and thalamic infarcts. Electronically Signed   By: Margarette Canada M.D.   On: 07/02/2015 22:11   Dg Chest Port 1 View  07/03/2015  CLINICAL DATA:  64 year old female with shortness of breath generalized weakness. Initial encounter. EXAM: PORTABLE CHEST 1 VIEW COMPARISON:  06/16/2015 and earlier. FINDINGS: Portable AP upright view at 0002 hours. Stable cardiomegaly and mediastinal contours. Improved lung volumes and regressed pulmonary interstitial opacity since September. Allowing for portable technique, the lungs are clear. No pneumothorax or pleural effusion. IMPRESSION: Stable cardiomegaly. No acute cardiopulmonary abnormality. Electronically Signed   By: Genevie Ann M.D.   On: 07/03/2015 00:25    EKG:   Normal sinus rhythm. Left atrial enlargement. ST-T wave abnormality in lateral leads no change from prior EKG.  IMPRESSION AND PLAN:   64 year old Mrs. Salem Caster with past medical history of obesity, hypertension, chronic renal failure stage IV, history of CVA in the past without any deficits comes to the emergency room after she had symptoms of blurred vision and dizziness this evening.  1. Generalized weakness, left thigh leg cramps Symptoms have improved while patient has been in the emergency room. She ambulated in the ER room with me without any  difficulty. Patient reports getting leg cramps on a daily basis. I'll prescribe her a small dose of Flexeril 5 mg twice a day when necessary as needed #25 pills.  2. History of CVA in the past. He does not seem at present patient is having an acute stroke. She is medically stable. Neurologically no deficits. Her vision speech are all clear. She takes aspirin 81 mg daily which is recommended to be continued. Her CT head shows old/remote strokes. No no acute strokes.  3. Hypertension Continue amlodipine and beta blockers. Patient reports she is taken off lisinopril.  4. Chronic kidney disease stage IV Patient has appointment to follow-up with Dr. Holley Raring. She currently is following up with nephrology at [facility] but is now in the process of getting nephrology appointment closer to home.   Patient's ER course was unremarkable. She is feeling fine and back to normal. She is requesting to go home. Daughter is in  the emergency room she voiced understanding. Patient and daughter and recommended to come back to the emergency room in case her symptoms worsen. I'll see she will continue all her home meds as before including aspirin 81 mg daily and keep her follow-up appointments with her primary care physician and nephrology.  All the records are reviewed and case discussed with Consulting provider. Management plans discussed with the patient, family and they are in agreement.  CODE STATUS:Full  TOTAL TIME TAKING CARE OF THIS PATIENT: 55  minutes.    Auri Jahnke M.D on 07/03/2015 at 12:56 AM  Between 7am to 6pm - Pager - [phone]  After 6pm go to [email or web address] - password [email or web address] [place] [facility]  Office  [phone]  CC: Primary care Physician: Pcp Not In System

## 2015-07-03 NOTE — ED Notes (Signed)
Bedside X-ray complete.

## 2015-07-03 NOTE — Discharge Instructions (Signed)
Call your PCP or come to the ER if s/s worsen

## 2015-07-14 ENCOUNTER — Ambulatory Visit: Payer: Medicare HMO | Attending: Pain Medicine | Admitting: Pain Medicine

## 2015-07-14 ENCOUNTER — Encounter: Payer: Self-pay | Admitting: Pain Medicine

## 2015-07-14 VITALS — BP 133/68 | HR 76 | Temp 95.6°F | Ht 66.0 in | Wt 241.0 lb

## 2015-07-14 DIAGNOSIS — M7060 Trochanteric bursitis, unspecified hip: Secondary | ICD-10-CM

## 2015-07-14 DIAGNOSIS — M5126 Other intervertebral disc displacement, lumbar region: Secondary | ICD-10-CM | POA: Diagnosis not present

## 2015-07-14 DIAGNOSIS — M5136 Other intervertebral disc degeneration, lumbar region: Secondary | ICD-10-CM | POA: Diagnosis not present

## 2015-07-14 DIAGNOSIS — M19012 Primary osteoarthritis, left shoulder: Secondary | ICD-10-CM

## 2015-07-14 DIAGNOSIS — Z96653 Presence of artificial knee joint, bilateral: Secondary | ICD-10-CM

## 2015-07-14 DIAGNOSIS — M461 Sacroiliitis, not elsewhere classified: Secondary | ICD-10-CM

## 2015-07-14 DIAGNOSIS — M170 Bilateral primary osteoarthritis of knee: Secondary | ICD-10-CM | POA: Diagnosis not present

## 2015-07-14 DIAGNOSIS — M5416 Radiculopathy, lumbar region: Secondary | ICD-10-CM

## 2015-07-14 DIAGNOSIS — M1610 Unilateral primary osteoarthritis, unspecified hip: Secondary | ICD-10-CM | POA: Diagnosis not present

## 2015-07-14 DIAGNOSIS — M150 Primary generalized (osteo)arthritis: Secondary | ICD-10-CM

## 2015-07-14 DIAGNOSIS — M5137 Other intervertebral disc degeneration, lumbosacral region: Secondary | ICD-10-CM

## 2015-07-14 DIAGNOSIS — M160 Bilateral primary osteoarthritis of hip: Secondary | ICD-10-CM

## 2015-07-14 DIAGNOSIS — M533 Sacrococcygeal disorders, not elsewhere classified: Secondary | ICD-10-CM

## 2015-07-14 DIAGNOSIS — M47817 Spondylosis without myelopathy or radiculopathy, lumbosacral region: Secondary | ICD-10-CM

## 2015-07-14 DIAGNOSIS — M19011 Primary osteoarthritis, right shoulder: Secondary | ICD-10-CM

## 2015-07-14 DIAGNOSIS — M172 Bilateral post-traumatic osteoarthritis of knee: Secondary | ICD-10-CM

## 2015-07-14 DIAGNOSIS — M159 Polyosteoarthritis, unspecified: Secondary | ICD-10-CM

## 2015-07-14 MED ORDER — OXYCODONE HCL 10 MG PO TABS: ORAL_TABLET | ORAL | Status: DC

## 2015-07-14 NOTE — Progress Notes (Signed)
Subjective:    Patient ID: Kimberly Shaffer, female    DOB: 04/10/1951, 64 y.o.   MRN: [id number]  HPI Patient is 64 year old female who [facility] for further evaluation and treatment of pain involving the lower back and lower extremity region predominantly. Patient states that she has pain involving the region of the right hip. Patient states the pain is aggravated by standing walking and that the pain becomes more intense as the day progresses. Patient states that she is without known trauma change in events of daily living the call significant change in symptomatology. Patient is with known degenerative joint disease of the hip. Patient is without plan for surgical intervention of the hip at this time. We discussed patient's condition and will proceed with injection of hip at time return appointment in attempt to decrease severity of symptoms, minimize progression of symptoms, and avoid need for more involved treatment. We will remain available to consider for modification of treatment regimen pending response to treatment and follow-up evaluation. The patient is understanding and in agreement status treatment plan   Review of Systems     Objective:   Physical Exam  There was tenderness over the splenius capitis and occipitalis region of mild degree with mild tennis over the region of the cervical facet and cervical paraspinal musculature region. Patient appeared to be with slightly decreased grip strength. Tinel and Phalen's maneuver were without increased pain of significant degree. There was tends to palpation of the acromioclavicular and glenohumeral joint region a moderate degree. Palpation over the last facet thoracic paraspinal musculature region was a tends to palpation with evidence of crepitus. Palpation over the lumbar paraspinal musculature region lumbar facet region was a tends to palpation of moderate degree. Lateral bending and rotation and extension and  palpation over the lumbar facets reproduce moderate discomfort. Palpation over the PSIS and PII S region reproduced moderate to moderately severe discomfort. There was moderately severe increase of pain with Patrick's maneuver. Palpation over the gluteal and piriformis musculature regions reproduced moderate discomfort. Straight leg raise was tolerates approximately 20 without a definite increased pain with dorsiflexion noted. There was negative clonus negative Homans. There was tenderness to palpation of the knee with well-healed surgical scar the knee without increased warmth or erythema in the region of the knee. EHL strength appeared to be decreased. There was tenderness of the greater trochanteric region iliotibial band region a moderate degree. No definite sensory deficit of dermatomal distribution of the lower extremities was noted. There was negative clonus negative Homans. Palpation in the region of the hip reproduced predominant portion of patient's pain with severe pain with attempted Patrick's maneuver. Abdomen was nontender and no costovertebral tenderness was noted.    Assessment & Plan:   Degenerative joint disease of hip  Degenerative disc disease lumbar spine   L1 to, L2-3, L3-4, L4-5, and L5-S1 with multilevel degenerative disc disease, disc bulging, broad-based disc bulging, left paracentral disc protrusion, superior margination of disc material, bilateral facet arthropathy, L1-2 and L2-3 and L3-4 with bilateral facet arthropathy as well as L4-5 and L5-S1.   Lumbar radiculopathy  Lumbar facet syndrome   Sacroiliac joint dysfunction   Degenerative joint disease of the knees   Status post total knee replacements    Plan   Continue present medication oxycodone  He will injection to be performed at time return appointme  F/U Dr.Abrill  for evaliation of  BP and general medical  condition  F/U surgical evaluation as discussed  F/U Dr. Tamala Julian as discussed  F/U Dr  Clayborn Bigness and Dr. Serafina Royals. as discussed  F/U neurological evaluation  May consider radiofrequency procedures and other treatment pending response to treatment and follow-up evaluation  Patient is to call [facility] prior to scheduled return appointment should there be significant change in condition or should patient have other concerns regarding condition

## 2015-07-14 NOTE — Patient Instructions (Addendum)
Plan   Continue present medication oxycodone   Hip injection to be performed at time return appointment as discussed  F/U PCP Dr.Abrill  for evaliation of  BP and general medical  condition.  F/U surgical evaluation. May consider pending follow-up evaluations  F/U neurological evaluation. May consider pending follow-up evaluations  F/U cardiology  May consider radiofrequency rhizolysis or intraspinal procedures pending response to present treatment and F/U evaluation.  Patient to call [facility] should patient have concerns prior to scheduled return appointment. Trigger Point Injection Trigger points are areas where you have muscle pain. A trigger point injection is a shot given in the trigger point to relieve that pain. A trigger point might feel like a knot in your muscle. It hurts to press on a trigger point. Sometimes the pain spreads out (radiates) to other parts of the body. For example, pressing on a trigger point in your shoulder might cause pain in your arm or neck. You might have one trigger point. Or, you might have more than one. People often have trigger points in their upper back and lower back. They also occur often in the neck and shoulders. Pain from a trigger point lasts for a long time. It can make it hard to keep moving. You might not be able to do the exercise or physical therapy that could help you deal with the pain. A trigger point injection may help. It does not work for everyone. But, it may relieve your pain for a few days or a few months. A trigger point injection does not cure long-lasting (chronic) pain. LET YOUR CAREGIVER KNOW ABOUT:  Any allergies (especially to latex, lidocaine, or steroids).  Blood-thinning medicines that you take. These drugs can lead to bleeding or bruising after an injection. They include:  Aspirin.  Ibuprofen.  Clopidogrel.  Warfarin.  Other medicines you take. This includes all vitamins, herbs, eyedrops,  over-the-counter medicines, and creams.  Use of steroids.  Recent infections.  Past problems with numbing medicines.  Bleeding problems.  Surgeries you have had.  Other health problems. RISKS AND COMPLICATIONS A trigger point injection is a safe treatment. However, problems may develop, such as:  Minor side effects usually go away in 1 to 2 days. These may include:  Soreness.  Bruising.  Stiffness.  More serious problems are rare. But, they may include:  Bleeding under the skin (hematoma).  Skin infection.  Breaking off of the needle under your skin.  Lung puncture.  The trigger point injection may not work for you. BEFORE THE PROCEDURE You may need to stop taking any medicine that thins your blood. This is to prevent bleeding and bruising. Usually these medicines are stopped several days before the injection. No other preparation is needed. PROCEDURE  A trigger point injection can be given in your caregiver's office or in a clinic. Each injection takes 2 minutes or less.  Your caregiver will feel for trigger points. The caregiver may use a marker to circle the area for the injection.  The skin over the trigger point will be washed with a germ-killing (antiseptic) solution.  The caregiver pinches the spot for the injection.  Then, a very thin needle is used for the shot. You may feel pain or a twitching feeling when the needle enters the trigger point.  A numbing solution may be injected into the trigger point. Sometimes a drug to keep down swelling, redness, and warmth (inflammation) is also injected.  Your caregiver moves the needle around the trigger  zone until the tightness and twitching goes away.  After the injection, your caregiver may put gentle pressure over the injection site.  Then it is covered with a bandage. AFTER THE PROCEDURE  You can go right home after the injection.  The bandage can be taken off after a few hours.  You may feel sore and  stiff for 1 to 2 days.  Go back to your regular activities slowly. Your caregiver may ask you to stretch your muscles. Do not do anything that takes extra energy for a few days.  Follow your caregiver's instructions to manage and treat other pain.   This information is not intended to replace advice given to you by your health care provider. Make sure you discuss any questions you have with your health care provider.   Document Released: 08/25/2011 Document Revised: 12/31/2012 Document Reviewed: 08/25/2011 Elsevier Interactive Patient Education 2016 [employer] Pain Management Discharge Instructions  General Discharge Instructions :  If you need to reach your doctor call: Monday-Friday 8:00 am - 4:00 pm at [phone] or toll free [phone].  After clinic hours [phone] to have operator reach doctor.  Bring all of your medication bottles to all your appointments in the pain clinic.  To cancel or reschedule your appointment with Pain Management please remember to call 24 hours in advance to avoid a fee.  Refer to the educational materials which you have been given on: General Risks, I had my Procedure. Discharge Instructions, Post Sedation.  Post Procedure Instructions:  The drugs you were given will stay in your system until tomorrow, so for the next 24 hours you should not drive, make any legal decisions or drink any alcoholic beverages.  You may eat anything you prefer, but it is better to start with liquids then soups and crackers, and gradually work up to solid foods.  Please notify your doctor immediately if you have any unusual bleeding, trouble breathing or pain that is not related to your normal pain.  Depending on the type of procedure that was done, some parts of your body may feel week and/or numb.  This usually clears up by tonight or the next day.  Walk with the use of an assistive device or accompanied by an adult for the 24 hours.  You may use ice on the  affected area for the first 24 hours.  Put ice in a Ziploc bag and cover with a towel and place against area 15 minutes on 15 minutes off.  You may switch to heat after 24 hours.GENERAL RISKS AND COMPLICATIONS  What are the risk, side effects and possible complications? Generally speaking, most procedures are safe.  However, with any procedure there are risks, side effects, and the possibility of complications.  The risks and complications are dependent upon the sites that are lesioned, or the type of nerve block to be performed.  The closer the procedure is to the spine, the more serious the risks are.  Great care is taken when placing the radio frequency needles, block needles or lesioning probes, but sometimes complications can occur.  Infection: Any time there is an injection through the skin, there is a risk of infection.  This is why sterile conditions are used for these blocks.  There are four possible types of infection.  Localized skin infection.  Central Nervous System Infection-This can be in the form of Meningitis, which can be deadly.  Epidural Infections-This can be in the form of an epidural abscess, which can cause pressure inside  of the spine, causing compression of the spinal cord with subsequent paralysis. This would require an emergency surgery to decompress, and there are no guarantees that the patient would recover from the paralysis.  Discitis-This is an infection of the intervertebral discs.  It occurs in about 1% of discography procedures.  It is difficult to treat and it may lead to surgery.        2. Pain: the needles have to go through skin and soft tissues, will cause soreness.       3. Damage to internal structures:  The nerves to be lesioned may be near blood vessels or    other nerves which can be potentially damaged.       4. Bleeding: Bleeding is more common if the patient is taking blood thinners such as  aspirin, Coumadin, Ticiid, Plavix, etc., or if he/she have  some genetic predisposition  such as hemophilia. Bleeding into the spinal canal can cause compression of the spinal  cord with subsequent paralysis.  This would require an emergency surgery to  decompress and there are no guarantees that the patient would recover from the  paralysis.       5. Pneumothorax:  Puncturing of a lung is a possibility, every time a needle is introduced in  the area of the chest or upper back.  Pneumothorax refers to free air around the  collapsed lung(s), inside of the thoracic cavity (chest cavity).  Another two possible  complications related to a similar event would include: Hemothorax and Chylothorax.   These are variations of the Pneumothorax, where instead of air around the collapsed  lung(s), you may have blood or chyle, respectively.       6. Spinal headaches: They may occur with any procedures in the area of the spine.       7. Persistent CSF (Cerebro-Spinal Fluid) leakage: This is a rare problem, but may occur  with prolonged intrathecal or epidural catheters either due to the formation of a fistulous  track or a dural tear.       8. Nerve damage: By working so close to the spinal cord, there is always a possibility of  nerve damage, which could be as serious as a permanent spinal cord injury with  paralysis.       9. Death:  Although rare, severe deadly allergic reactions known as "Anaphylactic  reaction" can occur to any of the medications used.      10. Worsening of the symptoms:  We can always make thing worse.  What are the chances of something like this happening? Chances of any of this occuring are extremely low.  By statistics, you have more of a chance of getting killed in a motor vehicle accident: while driving to the hospital than any of the above occurring .  Nevertheless, you should be aware that they are possibilities.  In general, it is similar to taking a shower.  Everybody knows that you can slip, hit your head and get killed.  Does that mean that you  should not shower again?  Nevertheless always keep in mind that statistics do not mean anything if you happen to be on the wrong side of them.  Even if a procedure has a 1 (one) in a 1,000,000 (million) chance of going wrong, it you happen to be that one..Also, keep in mind that by statistics, you have more of a chance of having something go wrong when taking medications.  Who should not have this procedure?  If you are on a blood thinning medication (e.g. Coumadin, Plavix, see list of "Blood Thinners"), or if you have an active infection going on, you should not have the procedure.  If you are taking any blood thinners, please inform your physician.  How should I prepare for this procedure?  Do not eat or drink anything at least six hours prior to the procedure.  Bring a driver with you .  It cannot be a taxi.  Come accompanied by an adult that can drive you back, and that is strong enough to help you if your legs get weak or numb from the local anesthetic.  Take all of your medicines the morning of the procedure with just enough water to swallow them.  If you have diabetes, make sure that you are scheduled to have your procedure done first thing in the morning, whenever possible.  If you have diabetes, take only half of your insulin dose and notify our nurse that you have done so as soon as you arrive at the clinic.  If you are diabetic, but only take blood sugar pills (oral hypoglycemic), then do not take them on the morning of your procedure.  You may take them after you have had the procedure.  Do not take aspirin or any aspirin-containing medications, at least eleven (11) days prior to the procedure.  They may prolong bleeding.  Wear loose fitting clothing that may be easy to take off and that you would not mind if it got stained with Betadine or blood.  Do not wear any jewelry or perfume  Remove any nail coloring.  It will interfere with some of our monitoring equipment.  NOTE:  Remember that this is not meant to be interpreted as a complete list of all possible complications.  Unforeseen problems may occur.  BLOOD THINNERS The following drugs contain aspirin or other products, which can cause increased bleeding during surgery and should not be taken for 2 weeks prior to and 1 week after surgery.  If you should need take something for relief of minor pain, you may take acetaminophen which is found in Tylenol,m Datril, Anacin-3 and Panadol. It is not blood thinner. The products listed below are.  Do not take any of the products listed below in addition to any listed on your instruction sheet.  A.P.C or A.P.C with Codeine Codeine Phosphate Capsules #3 Ibuprofen Ridaura  ABC compound Congesprin Imuran rimadil  Advil Cope Indocin Robaxisal  Alka-Seltzer Effervescent Pain Reliever and Antacid Coricidin or Coricidin-D  Indomethacin Rufen  Alka-Seltzer plus Cold Medicine Cosprin Ketoprofen S-A-C Tablets  Anacin Analgesic Tablets or Capsules Coumadin Korlgesic Salflex  Anacin Extra Strength Analgesic tablets or capsules CP-2 Tablets Lanoril Salicylate  Anaprox Cuprimine Capsules Levenox Salocol  Anexsia-D Dalteparin Magan Salsalate  Anodynos Darvon compound Magnesium Salicylate Sine-off  Ansaid Dasin Capsules Magsal Sodium Salicylate  Anturane Depen Capsules Marnal Soma  APF Arthritis pain formula Dewitt's Pills Measurin Stanback  Argesic Dia-Gesic Meclofenamic Sulfinpyrazone  Arthritis Bayer Timed Release Aspirin Diclofenac Meclomen Sulindac  Arthritis pain formula Anacin Dicumarol Medipren Supac  Analgesic (Safety coated) Arthralgen Diffunasal Mefanamic Suprofen  Arthritis Strength Bufferin Dihydrocodeine Mepro Compound Suprol  Arthropan liquid Dopirydamole Methcarbomol with Aspirin Synalgos  ASA tablets/Enseals Disalcid Micrainin Tagament  Ascriptin Doan's Midol Talwin  Ascriptin A/D Dolene Mobidin Tanderil  Ascriptin Extra Strength Dolobid Moblgesic Ticlid   Ascriptin with Codeine Doloprin or Doloprin with Codeine Momentum Tolectin  Asperbuf Duoprin Mono-gesic Trendar  Aspergum Duradyne Motrin or Motrin IB Triminicin  Aspirin plain, buffered or enteric coated Durasal Myochrisine Trigesic  Aspirin Suppositories Easprin Nalfon Trillsate  Aspirin with Codeine Ecotrin Regular or Extra Strength Naprosyn Uracel  Atromid-S Efficin Naproxen Ursinus  Auranofin Capsules Elmiron Neocylate Vanquish  Axotal Emagrin Norgesic Verin  Azathioprine Empirin or Empirin with Codeine Normiflo Vitamin E  Azolid Emprazil Nuprin Voltaren  Bayer Aspirin plain, buffered or children's or timed BC Tablets or powders Encaprin Orgaran Warfarin Sodium  Buff-a-Comp Enoxaparin Orudis Zorpin  Buff-a-Comp with Codeine Equegesic Os-Cal-Gesic   Buffaprin Excedrin plain, buffered or Extra Strength Oxalid   Bufferin Arthritis Strength Feldene Oxphenbutazone   Bufferin plain or Extra Strength Feldene Capsules Oxycodone with Aspirin   Bufferin with Codeine Fenoprofen Fenoprofen Pabalate or Pabalate-SF   Buffets II Flogesic Panagesic   Buffinol plain or Extra Strength Florinal or Florinal with Codeine Panwarfarin   Buf-Tabs Flurbiprofen Penicillamine   Butalbital Compound Four-way cold tablets Penicillin   Butazolidin Fragmin Pepto-Bismol   Carbenicillin Geminisyn Percodan   Carna Arthritis Reliever Geopen Persantine   Carprofen Gold's salt Persistin   Chloramphenicol Goody's Phenylbutazone   Chloromycetin Haltrain Piroxlcam   Clmetidine heparin Plaquenil   Cllnoril Hyco-pap Ponstel   Clofibrate Hydroxy chloroquine Propoxyphen         Before stopping any of these medications, be sure to consult the physician who ordered them.  Some, such as Coumadin (Warfarin) are ordered to prevent or treat serious conditions such as "deep thrombosis", "pumonary embolisms", and other heart problems.  The amount of time that you may need off of the medication may also vary with the medication  and the reason for which you were taking it.  If you are taking any of these medications, please make sure you notify your pain physician before you undergo any procedures.   Stop your aspirin 5 days before the procedure.

## 2015-07-14 NOTE — Progress Notes (Signed)
Safety precautions to be maintained throughout the outpatient stay will include: orient to surroundings, keep bed in low position, maintain call bell within reach at all times, provide assistance with transfer out of bed and ambulation.  

## 2015-08-11 ENCOUNTER — Ambulatory Visit: Payer: Medicare HMO | Attending: Pain Medicine | Admitting: Pain Medicine

## 2015-08-11 ENCOUNTER — Encounter: Payer: Self-pay | Admitting: Pain Medicine

## 2015-08-11 VITALS — BP 134/64 | HR 61 | Temp 97.9°F | Resp 16 | Wt 232.1 lb

## 2015-08-11 DIAGNOSIS — M172 Bilateral post-traumatic osteoarthritis of knee: Secondary | ICD-10-CM

## 2015-08-11 DIAGNOSIS — M5137 Other intervertebral disc degeneration, lumbosacral region: Secondary | ICD-10-CM

## 2015-08-11 DIAGNOSIS — M5116 Intervertebral disc disorders with radiculopathy, lumbar region: Secondary | ICD-10-CM | POA: Diagnosis not present

## 2015-08-11 DIAGNOSIS — M542 Cervicalgia: Secondary | ICD-10-CM | POA: Diagnosis present

## 2015-08-11 DIAGNOSIS — M533 Sacrococcygeal disorders, not elsewhere classified: Secondary | ICD-10-CM | POA: Diagnosis not present

## 2015-08-11 DIAGNOSIS — M19012 Primary osteoarthritis, left shoulder: Secondary | ICD-10-CM

## 2015-08-11 DIAGNOSIS — M160 Bilateral primary osteoarthritis of hip: Secondary | ICD-10-CM

## 2015-08-11 DIAGNOSIS — M47817 Spondylosis without myelopathy or radiculopathy, lumbosacral region: Secondary | ICD-10-CM

## 2015-08-11 DIAGNOSIS — M1610 Unilateral primary osteoarthritis, unspecified hip: Secondary | ICD-10-CM | POA: Insufficient documentation

## 2015-08-11 DIAGNOSIS — M461 Sacroiliitis, not elsewhere classified: Secondary | ICD-10-CM

## 2015-08-11 DIAGNOSIS — M159 Polyosteoarthritis, unspecified: Secondary | ICD-10-CM

## 2015-08-11 DIAGNOSIS — M5416 Radiculopathy, lumbar region: Secondary | ICD-10-CM

## 2015-08-11 DIAGNOSIS — M19011 Primary osteoarthritis, right shoulder: Secondary | ICD-10-CM

## 2015-08-11 DIAGNOSIS — M150 Primary generalized (osteo)arthritis: Secondary | ICD-10-CM

## 2015-08-11 DIAGNOSIS — M170 Bilateral primary osteoarthritis of knee: Secondary | ICD-10-CM

## 2015-08-11 DIAGNOSIS — M25512 Pain in left shoulder: Secondary | ICD-10-CM | POA: Diagnosis present

## 2015-08-11 DIAGNOSIS — M25511 Pain in right shoulder: Secondary | ICD-10-CM | POA: Diagnosis present

## 2015-08-11 DIAGNOSIS — M7060 Trochanteric bursitis, unspecified hip: Secondary | ICD-10-CM

## 2015-08-11 DIAGNOSIS — Z96653 Presence of artificial knee joint, bilateral: Secondary | ICD-10-CM

## 2015-08-11 DIAGNOSIS — M5126 Other intervertebral disc displacement, lumbar region: Secondary | ICD-10-CM | POA: Diagnosis not present

## 2015-08-11 MED ORDER — OXYCODONE HCL 10 MG PO TABS: ORAL_TABLET | ORAL | Status: DC

## 2015-08-11 NOTE — Progress Notes (Signed)
Safety precautions to be maintained throughout the outpatient stay will include: orient to surroundings, keep bed in low position, maintain call bell within reach at all times, provide assistance with transfer out of bed and ambulation.  

## 2015-08-11 NOTE — Progress Notes (Signed)
   Subjective:    Patient ID: Kimberly Shaffer, female    DOB: 07/23/1951, 64 y.o.   MRN: [id number]  HPI The patient is a 64 year old female who returns to pain management Center for further evaluation and treatment of pain involving the neck shoulders and upper the lower back and lower extremity region. At the present time patient states that her pain is fairly well controlled. Patient is with prior treatment of pain involving the lower back and lower extremity region shoulders. Patient denies any trauma change in events of daily living the cost change in symptomatology. At the present time patient is being considered for hemodialysis. The patient is undergone evaluation by Dr. Clayborn Bigness as well for consideration for cardiac stent placement. At the present time we will continue medications as prescribed and avoid interventional treatment. We will remain available to consider modification of treatment should they be significant change of patient's condition prior to scheduled return appointment. The patient was understanding and agreement suggested treatment plan.     Review of Systems     Objective:   Physical Exam  There was tends to palpation of the splenius capitis and a separate talus regions of mild degree with mild tenderness of the cervical facet cervical paraspinal must reason. There was tends to palpation of the acromial clavicular and glenohumeral joint region a moderate degree. There was slightly decreased grip strength. Tinel and Phalen's maneuver were without increased pain significant degree. There was tenderness to palpation of the region of the thoracic facet thoracic paraspinal must reason with no crepitus of the thoracic region noted. Palpation over the lumbar paraspinal muscles lumbar facet region associated with moderate discomfort. Lateral bending rotation extension and palpation of the lumbar facets reproduced moderate discomfort. There was mild tenderness to palpation of the  greater trochanteric region and iliotibial band region. Straight leg raise was tolerates approximately 20 without increased pain with dorsiflexion noted. There were well-healed surgical scars of the knees without increased warmth and erythema in the region of the knees. There was moderate tenderness to palpation of the knees noted. EHL strength appeared to be decreased there was no sensory deficit of dermatomal speech the lower extremities noted there was negative clonus negative Homans. Mild just palpation greater trochanteric region iliotibial band region. There was negative clonus negative Homans. Abdomen nontender with no costovertebral tenderness noted.    Assessment & Plan:    Degenerative joint disease of hip  Degenerative disc disease lumbar spine   L1 to, L2-3, L3-4, L4-5, and L5-S1 with multilevel degenerative disc disease, disc bulging, broad-based disc bulging, left paracentral disc protrusion, superior margination of disc material, bilateral facet arthropathy, L1-2 and L2-3 and L3-4 with bilateral facet arthropathy as well as L4-5 and L5-S1.   Lumbar radiculopathy  Lumbar facet syndrome   Sacroiliac joint dysfunction      PLAN   Continue present medication oxycodone  F/U Dr.Abrill  for evaliation of  BP and general medical  Condition and to discuss hemodialysis  F/U surgical evaluation as discussed  F/U Dr. Tamala Julian as discussed  F/U evaluation to consider hemodialysis as planned  F/U Dr Clayborn Bigness and Dr. Serafina Royals. as discussed  F/U neurological evaluation  May consider radiofrequency procedures and other treatment pending response to treatment and follow-up evaluation

## 2015-08-11 NOTE — Patient Instructions (Signed)
Continue present medication oxycodone  F/U PCP Dr. Julieanne Cotton  for evaliation of  BP and general medical  condition  F/U surgical evaluation as discussed  F/U neurological evaluation  F/U Drs. [place] as discussed  May consider radiofrequency rhizolysis or intraspinal procedures pending response to present treatment and F/U evaluation   Patient to call Pain Management Center should patient have concerns prior to scheduled return appointmen.

## 2015-08-19 ENCOUNTER — Ambulatory Visit: Payer: Medicare HMO | Attending: Pain Medicine | Admitting: Pain Medicine

## 2015-08-19 ENCOUNTER — Encounter: Payer: Self-pay | Admitting: Pain Medicine

## 2015-08-19 VITALS — BP 156/84 | HR 62 | Temp 98.2°F | Resp 18 | Ht 66.5 in | Wt 232.0 lb

## 2015-08-19 DIAGNOSIS — M19012 Primary osteoarthritis, left shoulder: Secondary | ICD-10-CM

## 2015-08-19 DIAGNOSIS — M159 Polyosteoarthritis, unspecified: Secondary | ICD-10-CM

## 2015-08-19 DIAGNOSIS — M533 Sacrococcygeal disorders, not elsewhere classified: Secondary | ICD-10-CM

## 2015-08-19 DIAGNOSIS — Z96653 Presence of artificial knee joint, bilateral: Secondary | ICD-10-CM | POA: Insufficient documentation

## 2015-08-19 DIAGNOSIS — M150 Primary generalized (osteo)arthritis: Secondary | ICD-10-CM

## 2015-08-19 DIAGNOSIS — M79604 Pain in right leg: Secondary | ICD-10-CM | POA: Diagnosis present

## 2015-08-19 DIAGNOSIS — M19011 Primary osteoarthritis, right shoulder: Secondary | ICD-10-CM

## 2015-08-19 DIAGNOSIS — M5416 Radiculopathy, lumbar region: Secondary | ICD-10-CM

## 2015-08-19 DIAGNOSIS — M170 Bilateral primary osteoarthritis of knee: Secondary | ICD-10-CM

## 2015-08-19 DIAGNOSIS — M79605 Pain in left leg: Secondary | ICD-10-CM | POA: Diagnosis present

## 2015-08-19 DIAGNOSIS — M160 Bilateral primary osteoarthritis of hip: Secondary | ICD-10-CM | POA: Insufficient documentation

## 2015-08-19 DIAGNOSIS — M5136 Other intervertebral disc degeneration, lumbar region: Secondary | ICD-10-CM | POA: Diagnosis not present

## 2015-08-19 DIAGNOSIS — M7060 Trochanteric bursitis, unspecified hip: Secondary | ICD-10-CM

## 2015-08-19 DIAGNOSIS — M172 Bilateral post-traumatic osteoarthritis of knee: Secondary | ICD-10-CM

## 2015-08-19 DIAGNOSIS — M461 Sacroiliitis, not elsewhere classified: Secondary | ICD-10-CM

## 2015-08-19 DIAGNOSIS — M47817 Spondylosis without myelopathy or radiculopathy, lumbosacral region: Secondary | ICD-10-CM

## 2015-08-19 DIAGNOSIS — M545 Low back pain: Secondary | ICD-10-CM | POA: Diagnosis present

## 2015-08-19 DIAGNOSIS — M5137 Other intervertebral disc degeneration, lumbosacral region: Secondary | ICD-10-CM

## 2015-08-19 MED ORDER — TRIAMCINOLONE ACETONIDE 40 MG/ML IJ SUSP
INTRAMUSCULAR | Status: AC
  Administered 2015-08-19: 40 mg
  Filled 2015-08-19: qty 1

## 2015-08-19 MED ORDER — CEFAZOLIN SODIUM 1 G IJ SOLR
INTRAMUSCULAR | Status: AC
  Administered 2015-08-19: 1 g via INTRAVENOUS
  Filled 2015-08-19: qty 10

## 2015-08-19 MED ORDER — LIDOCAINE HCL (PF) 1 % IJ SOLN
INTRAMUSCULAR | Status: AC
  Filled 2015-08-19: qty 5

## 2015-08-19 MED ORDER — ORPHENADRINE CITRATE 30 MG/ML IJ SOLN: 60.0000 mg | Freq: Once | INTRAMUSCULAR | Status: DC

## 2015-08-19 MED ORDER — CEFUROXIME AXETIL 250 MG PO TABS: 250.0000 mg | ORAL_TABLET | Freq: Two times a day (BID) | ORAL | Status: DC

## 2015-08-19 MED ORDER — LACTATED RINGERS IV SOLN: 1000.0000 mL | INTRAVENOUS | Status: DC

## 2015-08-19 MED ORDER — TRIAMCINOLONE ACETONIDE 40 MG/ML IJ SUSP
40.0000 mg | Freq: Once | INTRAMUSCULAR | Status: AC
  Administered 2015-08-19: 40 mg

## 2015-08-19 MED ORDER — FENTANYL CITRATE (PF) 100 MCG/2ML IJ SOLN
100.0000 ug | Freq: Once | INTRAMUSCULAR | Status: AC
  Administered 2015-08-19: 50 ug via INTRAVENOUS

## 2015-08-19 MED ORDER — MIDAZOLAM HCL 5 MG/5ML IJ SOLN
5.0000 mg | Freq: Once | INTRAMUSCULAR | Status: AC
  Administered 2015-08-19: 2 mg via INTRAVENOUS

## 2015-08-19 MED ORDER — BUPIVACAINE HCL (PF) 0.25 % IJ SOLN
30.0000 mL | Freq: Once | INTRAMUSCULAR | Status: AC
  Administered 2015-08-19: 20 mL

## 2015-08-19 MED ORDER — SODIUM CHLORIDE 0.9 % IJ SOLN: 20.0000 mL | Freq: Once | INTRAMUSCULAR | Status: DC

## 2015-08-19 MED ORDER — MIDAZOLAM HCL 5 MG/5ML IJ SOLN
INTRAMUSCULAR | Status: AC
  Administered 2015-08-19: 2 mg via INTRAVENOUS
  Filled 2015-08-19: qty 5

## 2015-08-19 MED ORDER — FENTANYL CITRATE (PF) 100 MCG/2ML IJ SOLN
INTRAMUSCULAR | Status: AC
  Administered 2015-08-19: 50 ug via INTRAVENOUS
  Filled 2015-08-19: qty 2

## 2015-08-19 MED ORDER — CEFAZOLIN SODIUM 1-5 GM-% IV SOLN: 1.0000 g | Freq: Once | INTRAVENOUS | Status: DC

## 2015-08-19 MED ORDER — LIDOCAINE HCL (PF) 1 % IJ SOLN: 10.0000 mL | Freq: Once | INTRAMUSCULAR | Status: DC

## 2015-08-19 MED ORDER — ORPHENADRINE CITRATE 30 MG/ML IJ SOLN
INTRAMUSCULAR | Status: AC
  Filled 2015-08-19: qty 2

## 2015-08-19 MED ORDER — BUPIVACAINE HCL (PF) 0.25 % IJ SOLN
INTRAMUSCULAR | Status: AC
  Administered 2015-08-19: 20 mL
  Filled 2015-08-19: qty 30

## 2015-08-19 NOTE — Progress Notes (Signed)
   Subjective:    Patient ID: Kimberly Shaffer, female    DOB: [birth year], 64 y.o.   MRN: [id number]  HPI                                           RIGHT HIP INJECTION  The patient is a 64 year old female who returns to pain management Center for further evaluation and treatment of pain involving the lower back and lower extremity regions predominantly. The patient is with known degenerative changes of the lumbar spine and is with prior history of knee replacements. The patient is with complaint of significant pain involving the region of the hips and is with known degenerative joint disease of the hips. At the present time patient is without plans for surgical intervention of the hip. We discussed patient's condition and decision has been made to proceed with right hip injection in attempt to decrease severity of patient's symptoms, minimize progression of symptoms, and avoid the need for more involved treatment. The patient was understanding and agreed with suggested treatment plan.    Description Of Procedure:  Right Hip Injection  The patient was taken to fluoroscopy suite and assumed the supine position. Blood pressure, pulse, pulse oximetry, EKG, monitoring all in place. The patient received IV Versed and fentanyl for conscious sedation. Betadine prep of proposed entry site was accomplished. Under fluoroscopic guidance a 22-gauge needle was inserted at the junction of the neck of the femur and hip of the femur with negative heme return. A total of 2 cc of 0.25% bupivacaine with Kenalog was injected incrementally via 22-gauge spinal needle.. The needle was withdrawn. The patient tolerated procedure well      PLAN   Continue present medication oxycodone and begin taking antibiotic Ceftin as prescribed. Please obtain your antibiotic Ceftin today and begin taking antibiotic today  F/U PCP Dr.Abrill  for evaliation of  BP and general medical  condition.  F/U surgical evaluation. May consider  pending follow-up evaluations  F/U neurological evaluation. May consider pending follow-up evaluations  F/U cardiology  May consider radiofrequency rhizolysis or intraspinal procedures pending response to present treatment and F/U evaluation.  Patient to call Pain Management Center should patient have concerns prior to scheduled return appointment.         Review of Systems     Objective:   Physical Exam        Assessment & Plan:

## 2015-08-19 NOTE — Patient Instructions (Addendum)
PLAN  Continue present medication oxycodone and begin taking antibiotic Ceftin as prescribed. Please obtain your antibiotic Ceftin today and begin taking antibiotic today  F/U PCP Dr.Abrill  for evaliation of  BP and general medical  condition.  F/U surgical evaluation. May consider pending follow-up evaluations  F/U neurological evaluation. May consider pending follow-up evaluations  F/U cardiology  May consider radiofrequency rhizolysis or intraspinal procedures pending response to present treatment and F/U evaluation.  Patient to call Pain Management Center should patient have concerns prior to scheduled return appointment. Pain Management Discharge Instructions  General Discharge Instructions :  If you need to reach your doctor call: Monday-Friday 8:00 am - 4:00 pm at [phone] or toll free [phone].  After clinic hours [phone] to have operator reach doctor.  Bring all of your medication bottles to all your appointments in the pain clinic.  To cancel or reschedule your appointment with Pain Management please remember to call 24 hours in advance to avoid a fee.  Refer to the educational materials which you have been given on: General Risks, I had my Procedure. Discharge Instructions, Post Sedation.  Post Procedure Instructions:  The drugs you were given will stay in your system until tomorrow, so for the next 24 hours you should not drive, make any legal decisions or drink any alcoholic beverages.  You may eat anything you prefer, but it is better to start with liquids then soups and crackers, and gradually work up to solid foods.  Please notify your doctor immediately if you have any unusual bleeding, trouble breathing or pain that is not related to your normal pain.  Depending on the type of procedure that was done, some parts of your body may feel week and/or numb.  This usually clears up by tonight or the next day.  Walk with the use of an assistive device or  accompanied by an adult for the 24 hours.  You may use ice on the affected area for the first 24 hours.  Put ice in a Ziploc bag and cover with a towel and place against area 15 minutes on 15 minutes off.  You may switch to heat after 24 hours.GENERAL RISKS AND COMPLICATIONS  What are the risk, side effects and possible complications? Generally speaking, most procedures are safe.  However, with any procedure there are risks, side effects, and the possibility of complications.  The risks and complications are dependent upon the sites that are lesioned, or the type of nerve block to be performed.  The closer the procedure is to the spine, the more serious the risks are.  Great care is taken when placing the radio frequency needles, block needles or lesioning probes, but sometimes complications can occur. 1. Infection: Any time there is an injection through the skin, there is a risk of infection.  This is why sterile conditions are used for these blocks.  There are four possible types of infection. 1. Localized skin infection. 2. Central Nervous System Infection-This can be in the form of Meningitis, which can be deadly. 3. Epidural Infections-This can be in the form of an epidural abscess, which can cause pressure inside of the spine, causing compression of the spinal cord with subsequent paralysis. This would require an emergency surgery to decompress, and there are no guarantees that the patient would recover from the paralysis. 4. Discitis-This is an infection of the intervertebral discs.  It occurs in about 1% of discography procedures.  It is difficult to treat and it may lead to surgery.  2. Pain: the needles have to go through skin and soft tissues, will cause soreness.       3. Damage to internal structures:  The nerves to be lesioned may be near blood vessels or    other nerves which can be potentially damaged.       4. Bleeding: Bleeding is more common if the patient is taking blood  thinners such as  aspirin, Coumadin, Ticiid, Plavix, etc., or if he/she have some genetic predisposition  such as hemophilia. Bleeding into the spinal canal can cause compression of the spinal  cord with subsequent paralysis.  This would require an emergency surgery to  decompress and there are no guarantees that the patient would recover from the  paralysis.       5. Pneumothorax:  Puncturing of a lung is a possibility, every time a needle is introduced in  the area of the chest or upper back.  Pneumothorax refers to free air around the  collapsed lung(s), inside of the thoracic cavity (chest cavity).  Another two possible  complications related to a similar event would include: Hemothorax and Chylothorax.   These are variations of the Pneumothorax, where instead of air around the collapsed  lung(s), you may have blood or chyle, respectively.       6. Spinal headaches: They may occur with any procedures in the area of the spine.       7. Persistent CSF (Cerebro-Spinal Fluid) leakage: This is a rare problem, but may occur  with prolonged intrathecal or epidural catheters either due to the formation of a fistulous  track or a dural tear.       8. Nerve damage: By working so close to the spinal cord, there is always a possibility of  nerve damage, which could be as serious as a permanent spinal cord injury with  paralysis.       9. Death:  Although rare, severe deadly allergic reactions known as "Anaphylactic  reaction" can occur to any of the medications used.      10. Worsening of the symptoms:  We can always make thing worse.  What are the chances of something like this happening? Chances of any of this occuring are extremely low.  By statistics, you have more of a chance of getting killed in a motor vehicle accident: while driving to the hospital than any of the above occurring .  Nevertheless, you should be aware that they are possibilities.  In general, it is similar to taking a shower.  Everybody knows  that you can slip, hit your head and get killed.  Does that mean that you should not shower again?  Nevertheless always keep in mind that statistics do not mean anything if you happen to be on the wrong side of them.  Even if a procedure has a 1 (one) in a 1,000,000 (million) chance of going wrong, it you happen to be that one..Also, keep in mind that by statistics, you have more of a chance of having something go wrong when taking medications.  Who should not have this procedure? If you are on a blood thinning medication (e.g. Coumadin, Plavix, see list of "Blood Thinners"), or if you have an active infection going on, you should not have the procedure.  If you are taking any blood thinners, please inform your physician.  How should I prepare for this procedure?  Do not eat or drink anything at least six hours prior to the procedure.  Bring a driver  with you .  It cannot be a taxi.  Come accompanied by an adult that can drive you back, and that is strong enough to help you if your legs get weak or numb from the local anesthetic.  Take all of your medicines the morning of the procedure with just enough water to swallow them.  If you have diabetes, make sure that you are scheduled to have your procedure done first thing in the morning, whenever possible.  If you have diabetes, take only half of your insulin dose and notify our nurse that you have done so as soon as you arrive at the clinic.  If you are diabetic, but only take blood sugar pills (oral hypoglycemic), then do not take them on the morning of your procedure.  You may take them after you have had the procedure.  Do not take aspirin or any aspirin-containing medications, at least eleven (11) days prior to the procedure.  They may prolong bleeding.  Wear loose fitting clothing that may be easy to take off and that you would not mind if it got stained with Betadine or blood.  Do not wear any jewelry or perfume  Remove any nail  coloring.  It will interfere with some of our monitoring equipment.  NOTE: Remember that this is not meant to be interpreted as a complete list of all possible complications.  Unforeseen problems may occur.  BLOOD THINNERS The following drugs contain aspirin or other products, which can cause increased bleeding during surgery and should not be taken for 2 weeks prior to and 1 week after surgery.  If you should need take something for relief of minor pain, you may take acetaminophen which is found in Tylenol,m Datril, Anacin-3 and Panadol. It is not blood thinner. The products listed below are.  Do not take any of the products listed below in addition to any listed on your instruction sheet.  A.P.C or A.P.C with Codeine Codeine Phosphate Capsules #3 Ibuprofen Ridaura  ABC compound Congesprin Imuran rimadil  Advil Cope Indocin Robaxisal  Alka-Seltzer Effervescent Pain Reliever and Antacid Coricidin or Coricidin-D  Indomethacin Rufen  Alka-Seltzer plus Cold Medicine Cosprin Ketoprofen S-A-C Tablets  Anacin Analgesic Tablets or Capsules Coumadin Korlgesic Salflex  Anacin Extra Strength Analgesic tablets or capsules CP-2 Tablets Lanoril Salicylate  Anaprox Cuprimine Capsules Levenox Salocol  Anexsia-D Dalteparin Magan Salsalate  Anodynos Darvon compound Magnesium Salicylate Sine-off  Ansaid Dasin Capsules Magsal Sodium Salicylate  Anturane Depen Capsules Marnal Soma  APF Arthritis pain formula Dewitt's Pills Measurin Stanback  Argesic Dia-Gesic Meclofenamic Sulfinpyrazone  Arthritis Bayer Timed Release Aspirin Diclofenac Meclomen Sulindac  Arthritis pain formula Anacin Dicumarol Medipren Supac  Analgesic (Safety coated) Arthralgen Diffunasal Mefanamic Suprofen  Arthritis Strength Bufferin Dihydrocodeine Mepro Compound Suprol  Arthropan liquid Dopirydamole Methcarbomol with Aspirin Synalgos  ASA tablets/Enseals Disalcid Micrainin Tagament  Ascriptin Doan's Midol Talwin  Ascriptin A/D Dolene  Mobidin Tanderil  Ascriptin Extra Strength Dolobid Moblgesic Ticlid  Ascriptin with Codeine Doloprin or Doloprin with Codeine Momentum Tolectin  Asperbuf Duoprin Mono-gesic Trendar  Aspergum Duradyne Motrin or Motrin IB Triminicin  Aspirin plain, buffered or enteric coated Durasal Myochrisine Trigesic  Aspirin Suppositories Easprin Nalfon Trillsate  Aspirin with Codeine Ecotrin Regular or Extra Strength Naprosyn Uracel  Atromid-S Efficin Naproxen Ursinus  Auranofin Capsules Elmiron Neocylate Vanquish  Axotal Emagrin Norgesic Verin  Azathioprine Empirin or Empirin with Codeine Normiflo Vitamin E  Azolid Emprazil Nuprin Voltaren  Bayer Aspirin plain, buffered or children's or timed BC Tablets or powders  Encaprin Orgaran Warfarin Sodium  Buff-a-Comp Enoxaparin Orudis Zorpin  Buff-a-Comp with Codeine Equegesic Os-Cal-Gesic   Buffaprin Excedrin plain, buffered or Extra Strength Oxalid   Bufferin Arthritis Strength Feldene Oxphenbutazone   Bufferin plain or Extra Strength Feldene Capsules Oxycodone with Aspirin   Bufferin with Codeine Fenoprofen Fenoprofen Pabalate or Pabalate-SF   Buffets II Flogesic Panagesic   Buffinol plain or Extra Strength Florinal or Florinal with Codeine Panwarfarin   Buf-Tabs Flurbiprofen Penicillamine   Butalbital Compound Four-way cold tablets Penicillin   Butazolidin Fragmin Pepto-Bismol   Carbenicillin Geminisyn Percodan   Carna Arthritis Reliever Geopen Persantine   Carprofen Gold's salt Persistin   Chloramphenicol Goody's Phenylbutazone   Chloromycetin Haltrain Piroxlcam   Clmetidine heparin Plaquenil   Cllnoril Hyco-pap Ponstel   Clofibrate Hydroxy chloroquine Propoxyphen         Before stopping any of these medications, be sure to consult the physician who ordered them.  Some, such as Coumadin (Warfarin) are ordered to prevent or treat serious conditions such as "deep thrombosis", "pumonary embolisms", and other heart problems.  The amount of time that  you may need off of the medication may also vary with the medication and the reason for which you were taking it.  If you are taking any of these medications, please make sure you notify your pain physician before you undergo any procedures.

## 2015-08-20 ENCOUNTER — Telehealth: Payer: Self-pay | Admitting: *Deleted

## 2015-08-20 NOTE — Telephone Encounter (Signed)
No problems post procedure phone call. 

## 2015-08-28 ENCOUNTER — Other Ambulatory Visit: Payer: Self-pay | Admitting: Pain Medicine

## 2015-09-08 ENCOUNTER — Ambulatory Visit: Payer: Medicare HMO | Attending: Pain Medicine | Admitting: Pain Medicine

## 2015-09-08 ENCOUNTER — Encounter: Payer: Self-pay | Admitting: Pain Medicine

## 2015-09-08 VITALS — BP 164/95 | HR 66 | Temp 97.5°F | Resp 18 | Ht 66.0 in | Wt 244.7 lb

## 2015-09-08 DIAGNOSIS — M5126 Other intervertebral disc displacement, lumbar region: Secondary | ICD-10-CM | POA: Insufficient documentation

## 2015-09-08 DIAGNOSIS — M533 Sacrococcygeal disorders, not elsewhere classified: Secondary | ICD-10-CM | POA: Diagnosis not present

## 2015-09-08 DIAGNOSIS — M19012 Primary osteoarthritis, left shoulder: Secondary | ICD-10-CM

## 2015-09-08 DIAGNOSIS — M172 Bilateral post-traumatic osteoarthritis of knee: Secondary | ICD-10-CM

## 2015-09-08 DIAGNOSIS — M5116 Intervertebral disc disorders with radiculopathy, lumbar region: Secondary | ICD-10-CM | POA: Diagnosis not present

## 2015-09-08 DIAGNOSIS — M1610 Unilateral primary osteoarthritis, unspecified hip: Secondary | ICD-10-CM | POA: Diagnosis not present

## 2015-09-08 DIAGNOSIS — M545 Low back pain: Secondary | ICD-10-CM | POA: Diagnosis present

## 2015-09-08 DIAGNOSIS — M1288 Other specific arthropathies, not elsewhere classified, other specified site: Secondary | ICD-10-CM | POA: Diagnosis not present

## 2015-09-08 DIAGNOSIS — M170 Bilateral primary osteoarthritis of knee: Secondary | ICD-10-CM

## 2015-09-08 DIAGNOSIS — M159 Polyosteoarthritis, unspecified: Secondary | ICD-10-CM

## 2015-09-08 DIAGNOSIS — M47817 Spondylosis without myelopathy or radiculopathy, lumbosacral region: Secondary | ICD-10-CM

## 2015-09-08 DIAGNOSIS — M7060 Trochanteric bursitis, unspecified hip: Secondary | ICD-10-CM

## 2015-09-08 DIAGNOSIS — M461 Sacroiliitis, not elsewhere classified: Secondary | ICD-10-CM

## 2015-09-08 DIAGNOSIS — M19011 Primary osteoarthritis, right shoulder: Secondary | ICD-10-CM

## 2015-09-08 DIAGNOSIS — M160 Bilateral primary osteoarthritis of hip: Secondary | ICD-10-CM

## 2015-09-08 DIAGNOSIS — M150 Primary generalized (osteo)arthritis: Secondary | ICD-10-CM

## 2015-09-08 DIAGNOSIS — M5416 Radiculopathy, lumbar region: Secondary | ICD-10-CM

## 2015-09-08 DIAGNOSIS — M542 Cervicalgia: Secondary | ICD-10-CM | POA: Diagnosis present

## 2015-09-08 DIAGNOSIS — M5137 Other intervertebral disc degeneration, lumbosacral region: Secondary | ICD-10-CM

## 2015-09-08 DIAGNOSIS — Z96653 Presence of artificial knee joint, bilateral: Secondary | ICD-10-CM

## 2015-09-08 MED ORDER — OXYCODONE HCL 10 MG PO TABS: ORAL_TABLET | ORAL | Status: DC

## 2015-09-08 NOTE — Progress Notes (Signed)
Safety precautions to be maintained throughout the outpatient stay will include: orient to surroundings, keep bed in low position, maintain call bell within reach at all times, provide assistance with transfer out of bed and ambulation.  

## 2015-09-08 NOTE — Patient Instructions (Addendum)
Continue present medication oxycodone  Injection of hip to be considered pending medical clearance by primary care physician  F/U PCP Dr. Julieanne Cotton  for evaliation of  BP and general medical  condition  F/U surgical evaluation as discussed  F/U neurological evaluation  F/U Drs. [place] as discussed  May consider radiofrequency rhizolysis or intraspinal procedures pending response to present treatment and F/U evaluation   Patient to call Pain Management Center should patient have concerns prior to scheduled return appointment.

## 2015-09-08 NOTE — Progress Notes (Signed)
Subjective:    Patient ID: Kimberly Shaffer, female    DOB: 1951-06-28, 64 y.o.   MRN: [id number]  HPI  Patient is a 64 year old female who returns to pain management Center for further evaluation and treatment of pain involving the neck shoulders and upper mid lower back and lower extremity region. Patient has had significant improvement of her hip since undergoing injection of the hip under fluoroscopic guidance. The patient states that she wishes to have her left hip injected as well. We discussed patient's condition on today's visit patient tolerating medications very well. We informed patient that she would need medical clearance from her primary care physician Dr.brill prior to proceeding with injection steroid injection of the left hip. The patient will follow-up with her primary care physician and we will remain available to proceed with interventional treatment pending decision of primary care physician Dr.Abrill as discussed and as explained to patient on today's visit. The patient denies any trauma change in events of daily living the cost change in symptomatology and states that she is doing remarkably well time. We will continue oxycodone and we'll remain available to consider additional modifications of treatment regimen pending follow-up evaluation. The patient was in agreement with suggested treatment plan.  A      Review of Systems     Objective:   Physical Exam    there was tends to palpation of paraspinal musculature region cervical region cervical facet region palpation which reproduces mild to moderate discomfort. There appeared to be unremarkable Spurling's maneuver. Palpation of the acromioclavicular and glenohumeral joint regions reproduce mild discomfort. Tinel and Phalen's maneuver associated with mild discomfort and grip strength appeared to be slightly decreased. There was tends to palpation of the thoracic facet thoracic paraspinal must reason of mild to moderate  degree with no crepitus of the thoracic region noted. Palpation over the lumbar paraspinal must reason lumbar facet region was attends to palpation of moderate degree with lateral bending rotation extension and palpation of the lumbar facets reproducing moderate discomfort. There was well-healed surgical scars of the knees without increased warmth and erythema in the region of the knees. There was tends to palpation over the PSIS and PII S region as well as the gluteal and piriformis musculature regions of mild to moderate degree. Straight leg raise was tolerates approximately 30 without increased pain with dorsiflexion noted. There was questionably decreased sensation of the lower extremities and a stocking-type distribution was negative clonus negative Homans. EHL strength appeared to be slightly decreased there was negative clonus and negative Homans. The abdomen was nontender with no costovertebral tenderness noted.     Assessment & Plan:  Degenerative joint disease of hip  Degenerative disc disease lumbar spine   L1 to, L2-3, L3-4, L4-5, and L5-S1 with multilevel degenerative disc disease, disc bulging, broad-based disc bulging, left paracentral disc protrusion, superior margination of disc material, bilateral facet arthropathy, L1-2 and L2-3 and L3-4 with bilateral facet arthropathy as well as L4-5 and L5-S1.   Lumbar radiculopathy  Lumbar facet syndrome   Sacroiliac joint dysfunction      PLAN   Continue present medication oxycodone  Injection of hip to be considered pending medical clearance by primary care physician  F/U PCP Dr. Julieanne Cotton  for evaliation of  BP and general medical  condition  F/U surgical evaluation as discussed  F/U neurological evaluation  F/U Drs. [place] as discussed  May consider radiofrequency rhizolysis or intraspinal procedures pending response to present  treatment and F/U evaluation   Patient to call Pain  Management Center should patient have concerns prior to scheduled return appointment.

## 2015-09-09 ENCOUNTER — Encounter: Payer: Medicare HMO | Admitting: Pain Medicine

## 2015-10-06 ENCOUNTER — Ambulatory Visit: Payer: Medicare HMO | Attending: Pain Medicine | Admitting: Pain Medicine

## 2015-10-06 ENCOUNTER — Encounter: Payer: Self-pay | Admitting: Pain Medicine

## 2015-10-06 VITALS — BP 193/83 | HR 70 | Temp 98.2°F | Resp 18 | Ht 66.5 in | Wt 232.0 lb

## 2015-10-06 DIAGNOSIS — M19012 Primary osteoarthritis, left shoulder: Secondary | ICD-10-CM | POA: Diagnosis not present

## 2015-10-06 DIAGNOSIS — M19011 Primary osteoarthritis, right shoulder: Secondary | ICD-10-CM | POA: Diagnosis not present

## 2015-10-06 DIAGNOSIS — M170 Bilateral primary osteoarthritis of knee: Secondary | ICD-10-CM

## 2015-10-06 DIAGNOSIS — M533 Sacrococcygeal disorders, not elsewhere classified: Secondary | ICD-10-CM | POA: Insufficient documentation

## 2015-10-06 DIAGNOSIS — M5137 Other intervertebral disc degeneration, lumbosacral region: Secondary | ICD-10-CM

## 2015-10-06 DIAGNOSIS — Z96653 Presence of artificial knee joint, bilateral: Secondary | ICD-10-CM

## 2015-10-06 DIAGNOSIS — M461 Sacroiliitis, not elsewhere classified: Secondary | ICD-10-CM

## 2015-10-06 DIAGNOSIS — M160 Bilateral primary osteoarthritis of hip: Secondary | ICD-10-CM

## 2015-10-06 DIAGNOSIS — M542 Cervicalgia: Secondary | ICD-10-CM | POA: Diagnosis present

## 2015-10-06 DIAGNOSIS — M25512 Pain in left shoulder: Secondary | ICD-10-CM | POA: Diagnosis present

## 2015-10-06 DIAGNOSIS — M1610 Unilateral primary osteoarthritis, unspecified hip: Secondary | ICD-10-CM | POA: Insufficient documentation

## 2015-10-06 DIAGNOSIS — M159 Polyosteoarthritis, unspecified: Secondary | ICD-10-CM

## 2015-10-06 DIAGNOSIS — M5116 Intervertebral disc disorders with radiculopathy, lumbar region: Secondary | ICD-10-CM | POA: Insufficient documentation

## 2015-10-06 DIAGNOSIS — M5126 Other intervertebral disc displacement, lumbar region: Secondary | ICD-10-CM | POA: Insufficient documentation

## 2015-10-06 DIAGNOSIS — M172 Bilateral post-traumatic osteoarthritis of knee: Secondary | ICD-10-CM

## 2015-10-06 DIAGNOSIS — M150 Primary generalized (osteo)arthritis: Secondary | ICD-10-CM

## 2015-10-06 DIAGNOSIS — M47817 Spondylosis without myelopathy or radiculopathy, lumbosacral region: Secondary | ICD-10-CM

## 2015-10-06 DIAGNOSIS — M5416 Radiculopathy, lumbar region: Secondary | ICD-10-CM

## 2015-10-06 DIAGNOSIS — M7060 Trochanteric bursitis, unspecified hip: Secondary | ICD-10-CM

## 2015-10-06 DIAGNOSIS — M25511 Pain in right shoulder: Secondary | ICD-10-CM | POA: Diagnosis present

## 2015-10-06 MED ORDER — OXYCODONE HCL 10 MG PO TABS: ORAL_TABLET | ORAL | Status: DC

## 2015-10-06 NOTE — Progress Notes (Signed)
Safety precautions to be maintained throughout the outpatient stay will include: orient to surroundings, keep bed in low position, maintain call bell within reach at all times, provide assistance with transfer out of bed and ambulation.  

## 2015-10-06 NOTE — Patient Instructions (Addendum)
Continue present medication oxycodone  Shoulder injection next appt  F/U PCP Dr. Julieanne Cotton  for evaliation of  BP and general medical  condition  F/U surgical evaluation as discussed. Appt ENT today  F/U neurological evaluation  F/U Drs. [place] as discussed  May consider radiofrequency rhizolysis or intraspinal procedures pending response to present treatment and F/U evaluation   Patient to call Pain Management Center should patient have concerns prior to scheduled return appointment.Pain Management Discharge Instructions  General Discharge Instructions :  If you need to reach your doctor call: Monday-Friday 8:00 am - 4:00 pm at [phone] or toll free [phone].  After clinic hours [phone] to have operator reach doctor.  Bring all of your medication bottles to all your appointments in the pain clinic.  To cancel or reschedule your appointment with Pain Management please remember to call 24 hours in advance to avoid a fee.  Refer to the educational materials which you have been given on: General Risks, I had my Procedure. Discharge Instructions, Post Sedation.  Post Procedure Instructions:  The drugs you were given will stay in your system until tomorrow, so for the next 24 hours you should not drive, make any legal decisions or drink any alcoholic beverages.  You may eat anything you prefer, but it is better to start with liquids then soups and crackers, and gradually work up to solid foods.  Please notify your doctor immediately if you have any unusual bleeding, trouble breathing or pain that is not related to your normal pain.  Depending on the type of procedure that was done, some parts of your body may feel week and/or numb.  This usually clears up by tonight or the next day.  Walk with the use of an assistive device or accompanied by an adult for the 24 hours.  You may use ice on the affected area for the first 24 hours.  Put  ice in a Ziploc bag and cover with a towel and place against area 15 minutes on 15 minutes off.  You may switch to heat after 24 hours.Trigger Point Injection Trigger points are areas where you have muscle pain. A trigger point injection is a shot given in the trigger point to relieve that pain. A trigger point might feel like a knot in your muscle. It hurts to press on a trigger point. Sometimes the pain spreads out (radiates) to other parts of the body. For example, pressing on a trigger point in your shoulder might cause pain in your arm or neck. You might have one trigger point. Or, you might have more than one. People often have trigger points in their upper back and lower back. They also occur often in the neck and shoulders. Pain from a trigger point lasts for a long time. It can make it hard to keep moving. You might not be able to do the exercise or physical therapy that could help you deal with the pain. A trigger point injection may help. It does not work for everyone. But, it may relieve your pain for a few days or a few months. A trigger point injection does not cure long-lasting (chronic) pain. LET YOUR CAREGIVER KNOW ABOUT:  Any allergies (especially to latex, lidocaine, or steroids).  Blood-thinning medicines that you take. These drugs can lead to bleeding or bruising after an injection. They include:  Aspirin.  Ibuprofen.  Clopidogrel.  Warfarin.  Other medicines you take. This includes all vitamins, herbs, eyedrops, over-the-counter medicines, and  creams.  Use of steroids.  Recent infections.  Past problems with numbing medicines.  Bleeding problems.  Surgeries you have had.  Other health problems. RISKS AND COMPLICATIONS A trigger point injection is a safe treatment. However, problems may develop, such as:  Minor side effects usually go away in 1 to 2 days. These may include:  Soreness.  Bruising.  Stiffness.  More serious problems are rare. But, they may  include:  Bleeding under the skin (hematoma).  Skin infection.  Breaking off of the needle under your skin.  Lung puncture.  The trigger point injection may not work for you. BEFORE THE PROCEDURE You may need to stop taking any medicine that thins your blood. This is to prevent bleeding and bruising. Usually these medicines are stopped several days before the injection. No other preparation is needed. PROCEDURE  A trigger point injection can be given in your caregiver's office or in a clinic. Each injection takes 2 minutes or less.  Your caregiver will feel for trigger points. The caregiver may use a marker to circle the area for the injection.  The skin over the trigger point will be washed with a germ-killing (antiseptic) solution.  The caregiver pinches the spot for the injection.  Then, a very thin needle is used for the shot. You may feel pain or a twitching feeling when the needle enters the trigger point.  A numbing solution may be injected into the trigger point. Sometimes a drug to keep down swelling, redness, and warmth (inflammation) is also injected.  Your caregiver moves the needle around the trigger zone until the tightness and twitching goes away.  After the injection, your caregiver may put gentle pressure over the injection site.  Then it is covered with a bandage. AFTER THE PROCEDURE  You can go right home after the injection.  The bandage can be taken off after a few hours.  You may feel sore and stiff for 1 to 2 days.  Go back to your regular activities slowly. Your caregiver may ask you to stretch your muscles. Do not do anything that takes extra energy for a few days.  Follow your caregiver's instructions to manage and treat other pain.   This information is not intended to replace advice given to you by your health care provider. Make sure you discuss any questions you have with your health care provider.   Document Released: 08/25/2011 Document  Revised: 12/31/2012 Document Reviewed: 08/25/2011 Elsevier Interactive Patient Education 2016 [place]  What are the risk, side effects and possible complications? Generally speaking, most procedures are safe.  However, with any procedure there are risks, side effects, and the possibility of complications.  The risks and complications are dependent upon the sites that are lesioned, or the type of nerve block to be performed.  The closer the procedure is to the spine, the more serious the risks are.  Great care is taken when placing the radio frequency needles, block needles or lesioning probes, but sometimes complications can occur.  Infection: Any time there is an injection through the skin, there is a risk of infection.  This is why sterile conditions are used for these blocks.  There are four possible types of infection.  Localized skin infection.  Central Nervous System Infection-This can be in the form of Meningitis, which can be deadly.  Epidural Infections-This can be in the form of an epidural abscess, which can cause pressure inside of the spine, causing compression of the  spinal cord with subsequent paralysis. This would require an emergency surgery to decompress, and there are no guarantees that the patient would recover from the paralysis.  Discitis-This is an infection of the intervertebral discs.  It occurs in about 1% of discography procedures.  It is difficult to treat and it may lead to surgery.        2. Pain: the needles have to go through skin and soft tissues, will cause soreness.       3. Damage to internal structures:  The nerves to be lesioned may be near blood vessels or    other nerves which can be potentially damaged.       4. Bleeding: Bleeding is more common if the patient is taking blood thinners such as  aspirin, Coumadin, Ticiid, Plavix, etc., or if he/she have some genetic predisposition  such as hemophilia. Bleeding into the  spinal canal can cause compression of the spinal  cord with subsequent paralysis.  This would require an emergency surgery to  decompress and there are no guarantees that the patient would recover from the  paralysis.       5. Pneumothorax:  Puncturing of a lung is a possibility, every time a needle is introduced in  the area of the chest or upper back.  Pneumothorax refers to free air around the  collapsed lung(s), inside of the thoracic cavity (chest cavity).  Another two possible  complications related to a similar event would include: Hemothorax and Chylothorax.   These are variations of the Pneumothorax, where instead of air around the collapsed  lung(s), you may have blood or chyle, respectively.       6. Spinal headaches: They may occur with any procedures in the area of the spine.       7. Persistent CSF (Cerebro-Spinal Fluid) leakage: This is a rare problem, but may occur  with prolonged intrathecal or epidural catheters either due to the formation of a fistulous  track or a dural tear.       8. Nerve damage: By working so close to the spinal cord, there is always a possibility of  nerve damage, which could be as serious as a permanent spinal cord injury with  paralysis.       9. Death:  Although rare, severe deadly allergic reactions known as "Anaphylactic  reaction" can occur to any of the medications used.      10. Worsening of the symptoms:  We can always make thing worse.  What are the chances of something like this happening? Chances of any of this occuring are extremely low.  By statistics, you have more of a chance of getting killed in a motor vehicle accident: while driving to the hospital than any of the above occurring .  Nevertheless, you should be aware that they are possibilities.  In general, it is similar to taking a shower.  Everybody knows that you can slip, hit your head and get killed.  Does that mean that you should not shower again?  Nevertheless always keep in mind that  statistics do not mean anything if you happen to be on the wrong side of them.  Even if a procedure has a 1 (one) in a 1,000,000 (million) chance of going wrong, it you happen to be that one..Also, keep in mind that by statistics, you have more of a chance of having something go wrong when taking medications.  Who should not have this procedure? If you are on a blood thinning  medication (e.g. Coumadin, Plavix, see list of "Blood Thinners"), or if you have an active infection going on, you should not have the procedure.  If you are taking any blood thinners, please inform your physician.  How should I prepare for this procedure?  Do not eat or drink anything at least six hours prior to the procedure.  Bring a driver with you .  It cannot be a taxi.  Come accompanied by an adult that can drive you back, and that is strong enough to help you if your legs get weak or numb from the local anesthetic.  Take all of your medicines the morning of the procedure with just enough water to swallow them.  If you have diabetes, make sure that you are scheduled to have your procedure done first thing in the morning, whenever possible.  If you have diabetes, take only half of your insulin dose and notify our nurse that you have done so as soon as you arrive at the clinic.  If you are diabetic, but only take blood sugar pills (oral hypoglycemic), then do not take them on the morning of your procedure.  You may take them after you have had the procedure.  Do not take aspirin or any aspirin-containing medications, at least eleven (11) days prior to the procedure.  They may prolong bleeding.  Wear loose fitting clothing that may be easy to take off and that you would not mind if it got stained with Betadine or blood.  Do not wear any jewelry or perfume  Remove any nail coloring.  It will interfere with some of our monitoring equipment.  NOTE: Remember that this is not meant to be interpreted as a complete  list of all possible complications.  Unforeseen problems may occur.  BLOOD THINNERS The following drugs contain aspirin or other products, which can cause increased bleeding during surgery and should not be taken for 2 weeks prior to and 1 week after surgery.  If you should need take something for relief of minor pain, you may take acetaminophen which is found in Tylenol,m Datril, Anacin-3 and Panadol. It is not blood thinner. The products listed below are.  Do not take any of the products listed below in addition to any listed on your instruction sheet.  A.P.C or A.P.C with Codeine Codeine Phosphate Capsules #3 Ibuprofen Ridaura  ABC compound Congesprin Imuran rimadil  Advil Cope Indocin Robaxisal  Alka-Seltzer Effervescent Pain Reliever and Antacid Coricidin or Coricidin-D  Indomethacin Rufen  Alka-Seltzer plus Cold Medicine Cosprin Ketoprofen S-A-C Tablets  Anacin Analgesic Tablets or Capsules Coumadin Korlgesic Salflex  Anacin Extra Strength Analgesic tablets or capsules CP-2 Tablets Lanoril Salicylate  Anaprox Cuprimine Capsules Levenox Salocol  Anexsia-D Dalteparin Magan Salsalate  Anodynos Darvon compound Magnesium Salicylate Sine-off  Ansaid Dasin Capsules Magsal Sodium Salicylate  Anturane Depen Capsules Marnal Soma  APF Arthritis pain formula Dewitt's Pills Measurin Stanback  Argesic Dia-Gesic Meclofenamic Sulfinpyrazone  Arthritis Bayer Timed Release Aspirin Diclofenac Meclomen Sulindac  Arthritis pain formula Anacin Dicumarol Medipren Supac  Analgesic (Safety coated) Arthralgen Diffunasal Mefanamic Suprofen  Arthritis Strength Bufferin Dihydrocodeine Mepro Compound Suprol  Arthropan liquid Dopirydamole Methcarbomol with Aspirin Synalgos  ASA tablets/Enseals Disalcid Micrainin Tagament  Ascriptin Doan's Midol Talwin  Ascriptin A/D Dolene Mobidin Tanderil  Ascriptin Extra Strength Dolobid Moblgesic Ticlid  Ascriptin with Codeine Doloprin or Doloprin with Codeine Momentum  Tolectin  Asperbuf Duoprin Mono-gesic Trendar  Aspergum Duradyne Motrin or Motrin IB Triminicin  Aspirin plain, buffered or enteric coated Durasal  Myochrisine Trigesic  Aspirin Suppositories Easprin Nalfon Trillsate  Aspirin with Codeine Ecotrin Regular or Extra Strength Naprosyn Uracel  Atromid-S Efficin Naproxen Ursinus  Auranofin Capsules Elmiron Neocylate Vanquish  Axotal Emagrin Norgesic Verin  Azathioprine Empirin or Empirin with Codeine Normiflo Vitamin E  Azolid Emprazil Nuprin Voltaren  Bayer Aspirin plain, buffered or children's or timed BC Tablets or powders Encaprin Orgaran Warfarin Sodium  Buff-a-Comp Enoxaparin Orudis Zorpin  Buff-a-Comp with Codeine Equegesic Os-Cal-Gesic   Buffaprin Excedrin plain, buffered or Extra Strength Oxalid   Bufferin Arthritis Strength Feldene Oxphenbutazone   Bufferin plain or Extra Strength Feldene Capsules Oxycodone with Aspirin   Bufferin with Codeine Fenoprofen Fenoprofen Pabalate or Pabalate-SF   Buffets II Flogesic Panagesic   Buffinol plain or Extra Strength Florinal or Florinal with Codeine Panwarfarin   Buf-Tabs Flurbiprofen Penicillamine   Butalbital Compound Four-way cold tablets Penicillin   Butazolidin Fragmin Pepto-Bismol   Carbenicillin Geminisyn Percodan   Carna Arthritis Reliever Geopen Persantine   Carprofen Gold's salt Persistin   Chloramphenicol Goody's Phenylbutazone   Chloromycetin Haltrain Piroxlcam   Clmetidine heparin Plaquenil   Cllnoril Hyco-pap Ponstel   Clofibrate Hydroxy chloroquine Propoxyphen         Before stopping any of these medications, be sure to consult the physician who ordered them.  Some, such as Coumadin (Warfarin) are ordered to prevent or treat serious conditions such as "deep thrombosis", "pumonary embolisms", and other heart problems.  The amount of time that you may need off of the medication may also vary with the medication and the reason for which you were taking it.  If you are taking any  of these medications, please make sure you notify your pain physician before you undergo any procedures.

## 2015-10-06 NOTE — Progress Notes (Signed)
   Subjective:    Patient ID: Kimberly Shaffer, female    DOB: [birth year], 65 y.o.   MRN: [id number]  HPI  The patient is a 65 year old female who returns to pain management for further evaluation and treatment of pain involving the neck shoulders and hips lower back lower extremity regions. The patient has had improvement of her pain occurring interventional treatment in pain management Center. The patient states that she has some pain involving the region of the shoulders which is aggravated by reaching and lifting pushing pulling maneuvers. We will consider patient for interventional treatment of pain of the shoulders at time return appointment as discussed. The patient will continue oxycodone as prescribed. The patient denied any trauma change in events of daily living the call significant change in symptomatology. The patient will undergo ENT evaluation today. The patient will call pain management should they be change in condition prior to scheduled return appointment. The patient was understanding and agreed to suggested treatment plan      Review of Systems     Objective:   Physical Exam  There was tends to palpation of paraspinal muscular region cervical region cervical facet region with unremarkable Spurling's maneuver noted. There was tenderness of the acromioclavicular and glenohumeral joint regions of moderate to moderately severe degree. The patient was with limited range of motion of the shoulders with difficulty performing drop test. There was tenderness over the region of the cervical facet cervical paraspinal musculature region as well as the thoracic facet thoracic paraspinal musculature region with no crepitus of the thoracic region noted. Tinel and Phalen's maneuver were without increased pain of significant degree and patient appeared to be with slightly decreased grip strength.. Palpation over the lumbar paraspinal must reason lumbar facet region was with mild to moderate  discomfort. Lateral bending rotation extension and palpation of the lumbar facets reproduce mild to moderate discomfort. There was moderate tends to palpation greater trochanteric region iliotibial band region. There were well-healed surgical scars of the knee without increased warmth and erythema in the region of the knees. EHL strength appeared to be slightly decreased. No definite sensory deficit or dermatomal distribution of the lower extremities noted. EHL strength appeared to be decreased. There was negative clonus negative Homans. There was mild tenderness of the PSIS and PII S regions. Abdomen nontender with no costovertebral tenderness noted.      Assessment & Plan:  Degenerative joint disease of shoulders  Degenerative joint disease of hip  Degenerative disc disease lumbar spine   L1 to, L2-3, L3-4, L4-5, and L5-S1 with multilevel degenerative disc disease, disc bulging, broad-based disc bulging, left paracentral disc protrusion, superior margination of disc material, bilateral facet arthropathy, L1-2 and L2-3 and L3-4 with bilateral facet arthropathy as well as L4-5 and L5-S1.   Lumbar radiculopathy  Lumbar facet syndrome   Sacroiliac joint dysfunction      PLAN  Continue present medication oxycodone  Shoulder injection next appt  F/U PCP Dr. Julieanne Cotton  for evaliation of  BP and general medical  condition  F/U surgical evaluation as discussed. Appt ENT today  F/U neurological evaluation  F/U Drs. [place] as discussed  May consider radiofrequency rhizolysis or intraspinal procedures pending response to present treatment and F/U evaluation   Patient to call Pain Management Center should patient have concerns prior to scheduled return appointment

## 2015-10-20 ENCOUNTER — Other Ambulatory Visit: Payer: Self-pay | Admitting: Vascular Surgery

## 2015-10-21 ENCOUNTER — Encounter: Payer: Self-pay | Admitting: Pain Medicine

## 2015-10-21 ENCOUNTER — Ambulatory Visit: Payer: Medicare HMO | Attending: Pain Medicine | Admitting: Pain Medicine

## 2015-10-21 VITALS — BP 150/81 | HR 58 | Temp 98.1°F | Resp 16 | Ht 66.0 in | Wt 236.0 lb

## 2015-10-21 DIAGNOSIS — M19019 Primary osteoarthritis, unspecified shoulder: Secondary | ICD-10-CM | POA: Diagnosis not present

## 2015-10-21 DIAGNOSIS — M546 Pain in thoracic spine: Secondary | ICD-10-CM | POA: Insufficient documentation

## 2015-10-21 DIAGNOSIS — Z96653 Presence of artificial knee joint, bilateral: Secondary | ICD-10-CM

## 2015-10-21 DIAGNOSIS — M47817 Spondylosis without myelopathy or radiculopathy, lumbosacral region: Secondary | ICD-10-CM

## 2015-10-21 DIAGNOSIS — M533 Sacrococcygeal disorders, not elsewhere classified: Secondary | ICD-10-CM

## 2015-10-21 DIAGNOSIS — M5137 Other intervertebral disc degeneration, lumbosacral region: Secondary | ICD-10-CM

## 2015-10-21 DIAGNOSIS — M7060 Trochanteric bursitis, unspecified hip: Secondary | ICD-10-CM

## 2015-10-21 DIAGNOSIS — M150 Primary generalized (osteo)arthritis: Secondary | ICD-10-CM

## 2015-10-21 DIAGNOSIS — M5416 Radiculopathy, lumbar region: Secondary | ICD-10-CM

## 2015-10-21 DIAGNOSIS — M25512 Pain in left shoulder: Secondary | ICD-10-CM | POA: Insufficient documentation

## 2015-10-21 DIAGNOSIS — M159 Polyosteoarthritis, unspecified: Secondary | ICD-10-CM

## 2015-10-21 DIAGNOSIS — M19011 Primary osteoarthritis, right shoulder: Secondary | ICD-10-CM

## 2015-10-21 DIAGNOSIS — M170 Bilateral primary osteoarthritis of knee: Secondary | ICD-10-CM

## 2015-10-21 DIAGNOSIS — M160 Bilateral primary osteoarthritis of hip: Secondary | ICD-10-CM

## 2015-10-21 DIAGNOSIS — M19012 Primary osteoarthritis, left shoulder: Secondary | ICD-10-CM

## 2015-10-21 DIAGNOSIS — M542 Cervicalgia: Secondary | ICD-10-CM | POA: Diagnosis present

## 2015-10-21 DIAGNOSIS — M461 Sacroiliitis, not elsewhere classified: Secondary | ICD-10-CM

## 2015-10-21 DIAGNOSIS — M172 Bilateral post-traumatic osteoarthritis of knee: Secondary | ICD-10-CM

## 2015-10-21 MED ORDER — BUPIVACAINE HCL (PF) 0.25 % IJ SOLN
INTRAMUSCULAR | Status: AC
  Administered 2015-10-21: 10:00:00
  Filled 2015-10-21: qty 30

## 2015-10-21 MED ORDER — ORPHENADRINE CITRATE 30 MG/ML IJ SOLN: 60.0000 mg | Freq: Once | INTRAMUSCULAR | Status: DC

## 2015-10-21 MED ORDER — BUPIVACAINE HCL (PF) 0.25 % IJ SOLN: 30.0000 mL | Freq: Once | INTRAMUSCULAR | Status: DC

## 2015-10-21 MED ORDER — TRIAMCINOLONE ACETONIDE 40 MG/ML IJ SUSP
INTRAMUSCULAR | Status: AC
  Administered 2015-10-21: 10:00:00
  Filled 2015-10-21: qty 1

## 2015-10-21 MED ORDER — TRIAMCINOLONE ACETONIDE 40 MG/ML IJ SUSP: 40.0000 mg | Freq: Once | INTRAMUSCULAR | Status: DC

## 2015-10-21 MED ORDER — CEFUROXIME AXETIL 250 MG PO TABS: 250.0000 mg | ORAL_TABLET | Freq: Two times a day (BID) | ORAL | Status: DC

## 2015-10-21 MED ORDER — SODIUM CHLORIDE 0.9 % IJ SOLN
INTRAMUSCULAR | Status: AC
  Administered 2015-10-21: 10:00:00
  Filled 2015-10-21: qty 20

## 2015-10-21 NOTE — Progress Notes (Signed)
   Subjective:    Patient ID: Kimberly Shaffer, female    DOB: [birth year], 65 y.o.   MRN: [id number]  HPI                                                LEFT SHOULDER INJECTION   The patient is a 65 year old female who returns to pain management for further evaluation and treatment of pain involving the neck upper extremity region entire back and lower extremity regions. The patient is with severely disabling pain involving the region of the left shoulder. The pain is aggravated by reaching and lifting pushing pulling maneuvers the pain interferes the patient ability to perform all activities of daily living and interferes the patient ability to obtain restful sleep. There is concern regarding patient's pain being due to intra-articular abnormalities of the shoulder. The patient is with known degenerative joint disease of the shoulder. The risks benefits and expectations of procedure have been discussed and explained to patient. We will proceed with injection of the shoulder in attempt to decrease severity of patient's symptoms, minimize progression of symptoms, and avoid the need for more involved treatment. IN agreement suggested treatment plan   Description Of Procedure:  Left Shoulder Injection  The patient was in sitting position with EKG, blood pressure, pulse, and pulse oximetry monitoring all in place. Betadine prep of proposed entry site was accomplished and landmarks for left shoulder injection were identified  Left Shoulder Injection (Anterior Approach)  With the patient in sitting position and Betadine prep of proposed entry site a 22-gauge needle was inserted in the region of the right shoulder following identification of landmarks for right shoulder injection anterior approach. A total of 2 cc of 0.125% bupivacaine with Kenalog was injected for left shoulder injection anterior approach needle was removed  Left Shoulder Injection (Posterior Approach)  The procedure was performed in  the posterior region of the left shoulder exactly as was performed in the anterior region of the left shoulder following identification of landmarks for left shoulder injection posterior approach.  The patient tolerated procedure well  A total of 20 mg of Kenalog was utilized for the procedure     PLAN   Continue present medication oxycodone. Please get Ceftin antibiotic and begin taking Ceftin antibiotic today as prescribed  F/U PCP Dr. Julieanne Cotton  for evaliation of  BP and general medical  condition  F/U surgical evaluation as discussed  F/U neurological evaluation  F/U Drs. [place] as discussed  May consider radiofrequency rhizolysis or intraspinal procedures pending response to present treatment and F/U evaluation   Patient to call Pain Management Center should patient have concerns prior to scheduled return appointment   Review of Systems     Objective:   Physical Exam        Assessment & Plan:

## 2015-10-21 NOTE — Progress Notes (Signed)
Safety precautions to be maintained throughout the outpatient stay will include: orient to surroundings, keep bed in low position, maintain call bell within reach at all times, provide assistance with transfer out of bed and ambulation.  

## 2015-10-21 NOTE — Progress Notes (Signed)
   Subjective:    Patient ID: Kimberly Shaffer, female    DOB: 1951/02/10, 65 y.o.   MRN: [id number]  HPI    Review of Systems     Objective:   Physical Exam        Assessment & Plan:

## 2015-10-21 NOTE — Patient Instructions (Addendum)
Continue present medication oxycodone. Please get Ceftin antibiotic and begin taking Ceftin antibiotic today as prescribed  F/U PCP Dr. Julieanne Cotton  for evaliation of  BP and general medical  condition  F/U surgical evaluation as discussed. Appt ENT today  F/U neurological evaluation  F/U Drs. Meyer as discussed  May consider radiofrequency rhizolysis or intraspinal procedures pending response to present treatment and F/U evaluation   Patient to call [facility] should patient have concerns prior to scheduled return appointmentTrigger Point Injection Trigger points are areas where you have muscle pain. A trigger point injection is a shot given in the trigger point to relieve that pain. A trigger point might feel like a knot in your muscle. It hurts to press on a trigger point. Sometimes the pain spreads out (radiates) to other parts of the body. For example, pressing on a trigger point in your shoulder might cause pain in your arm or neck. You might have one trigger point. Or, you might have more than one. People often have trigger points in their upper back and lower back. They also occur often in the neck and shoulders. Pain from a trigger point lasts for a long time. It can make it hard to keep moving. You might not be able to do the exercise or physical therapy that could help you deal with the pain. A trigger point injection may help. It does not work for everyone. But, it may relieve your pain for a few days or a few months. A trigger point injection does not cure long-lasting (chronic) pain. LET YOUR CAREGIVER KNOW ABOUT:  Any allergies (especially to latex, lidocaine, or steroids).  Blood-thinning medicines that you take. These drugs can lead to bleeding or bruising after an injection. They include:  Aspirin.  Ibuprofen.  Clopidogrel.  Warfarin.  Other medicines you take. This includes all vitamins, herbs, eyedrops, over-the-counter  medicines, and creams.  Use of steroids.  Recent infections.  Past problems with numbing medicines.  Bleeding problems.  Surgeries you have had.  Other health problems. RISKS AND COMPLICATIONS A trigger point injection is a safe treatment. However, problems may develop, such as:  Minor side effects usually go away in 1 to 2 days. These may include:  Soreness.  Bruising.  Stiffness.  More serious problems are rare. But, they may include:  Bleeding under the skin (hematoma).  Skin infection.  Breaking off of the needle under your skin.  Lung puncture.  The trigger point injection may not work for you. BEFORE THE PROCEDURE You may need to stop taking any medicine that thins your blood. This is to prevent bleeding and bruising. Usually these medicines are stopped several days before the injection. No other preparation is needed. PROCEDURE  A trigger point injection can be given in your caregiver's office or in a clinic. Each injection takes 2 minutes or less.  Your caregiver will feel for trigger points. The caregiver may use a marker to circle the area for the injection.  The skin over the trigger point will be washed with a germ-killing (antiseptic) solution.  The caregiver pinches the spot for the injection.  Then, a very thin needle is used for the shot. You may feel pain or a twitching feeling when the needle enters the trigger point.  A numbing solution may be injected into the trigger point. Sometimes a drug to keep down swelling, redness, and warmth (inflammation) is also injected.  Your caregiver moves the needle around the trigger  zone until the tightness and twitching goes away.  After the injection, your caregiver may put gentle pressure over the injection site.  Then it is covered with a bandage. AFTER THE PROCEDURE  You can go right home after the injection.  The bandage can be taken off after a few hours.  You may feel sore and stiff for 1 to 2  days.  Go back to your regular activities slowly. Your caregiver may ask you to stretch your muscles. Do not do anything that takes extra energy for a few days.  Follow your caregiver's instructions to manage and treat other pain.   This information is not intended to replace advice given to you by your health care provider. Make sure you discuss any questions you have with your health care provider.   Document Released: 08/25/2011 Document Revised: 12/31/2012 Document Reviewed: 08/25/2011 Elsevier Interactive Patient Education 2016 [employer] Pain Management Discharge Instructions  General Discharge Instructions :  If you need to reach your doctor call: Monday-Friday 8:00 am - 4:00 pm at [phone] or toll free [phone].  After clinic hours [phone] to have operator reach doctor.  Bring all of your medication bottles to all your appointments in the pain clinic.  To cancel or reschedule your appointment with Pain Management please remember to call 24 hours in advance to avoid a fee.  Refer to the educational materials which you have been given on: General Risks, I had my Procedure. Discharge Instructions, Post Sedation.  Post Procedure Instructions:  The drugs you were given will stay in your system until tomorrow, so for the next 24 hours you should not drive, make any legal decisions or drink any alcoholic beverages.  You may eat anything you prefer, but it is better to start with liquids then soups and crackers, and gradually work up to solid foods.  Please notify your doctor immediately if you have any unusual bleeding, trouble breathing or pain that is not related to your normal pain.  Depending on the type of procedure that was done, some parts of your body may feel week and/or numb.  This usually clears up by tonight or the next day.  Walk with the use of an assistive device or accompanied by an adult for the 24 hours.  You may use ice on the affected area  for the first 24 hours.  Put ice in a Ziploc bag and cover with a towel and place against area 15 minutes on 15 minutes off.  You may switch to heat after 24 hours.GENERAL RISKS AND COMPLICATIONS  What are the risk, side effects and possible complications? Generally speaking, most procedures are safe.  However, with any procedure there are risks, side effects, and the possibility of complications.  The risks and complications are dependent upon the sites that are lesioned, or the type of nerve block to be performed.  The closer the procedure is to the spine, the more serious the risks are.  Great care is taken when placing the radio frequency needles, block needles or lesioning probes, but sometimes complications can occur.  Infection: Any time there is an injection through the skin, there is a risk of infection.  This is why sterile conditions are used for these blocks.  There are four possible types of infection.  Localized skin infection.  Central Nervous System Infection-This can be in the form of Meningitis, which can be deadly.  Epidural Infections-This can be in the form of an epidural abscess, which can cause pressure inside  of the spine, causing compression of the spinal cord with subsequent paralysis. This would require an emergency surgery to decompress, and there are no guarantees that the patient would recover from the paralysis.  Discitis-This is an infection of the intervertebral discs.  It occurs in about 1% of discography procedures.  It is difficult to treat and it may lead to surgery.        2. Pain: the needles have to go through skin and soft tissues, will cause soreness.       3. Damage to internal structures:  The nerves to be lesioned may be near blood vessels or    other nerves which can be potentially damaged.       4. Bleeding: Bleeding is more common if the patient is taking blood thinners such as  aspirin, Coumadin, Ticiid, Plavix, etc., or if he/she have some genetic  predisposition  such as hemophilia. Bleeding into the spinal canal can cause compression of the spinal  cord with subsequent paralysis.  This would require an emergency surgery to  decompress and there are no guarantees that the patient would recover from the  paralysis.       5. Pneumothorax:  Puncturing of a lung is a possibility, every time a needle is introduced in  the area of the chest or upper back.  Pneumothorax refers to free air around the  collapsed lung(s), inside of the thoracic cavity (chest cavity).  Another two possible  complications related to a similar event would include: Hemothorax and Chylothorax.   These are variations of the Pneumothorax, where instead of air around the collapsed  lung(s), you may have blood or chyle, respectively.       6. Spinal headaches: They may occur with any procedures in the area of the spine.       7. Persistent CSF (Cerebro-Spinal Fluid) leakage: This is a rare problem, but may occur  with prolonged intrathecal or epidural catheters either due to the formation of a fistulous  track or a dural tear.       8. Nerve damage: By working so close to the spinal cord, there is always a possibility of  nerve damage, which could be as serious as a permanent spinal cord injury with  paralysis.       9. Death:  Although rare, severe deadly allergic reactions known as "Anaphylactic  reaction" can occur to any of the medications used.      10. Worsening of the symptoms:  We can always make thing worse.  What are the chances of something like this happening? Chances of any of this occuring are extremely low.  By statistics, you have more of a chance of getting killed in a motor vehicle accident: while driving to the hospital than any of the above occurring .  Nevertheless, you should be aware that they are possibilities.  In general, it is similar to taking a shower.  Everybody knows that you can slip, hit your head and get killed.  Does that mean that you should not  shower again?  Nevertheless always keep in mind that statistics do not mean anything if you happen to be on the wrong side of them.  Even if a procedure has a 1 (one) in a 1,000,000 (million) chance of going wrong, it you happen to be that one..Also, keep in mind that by statistics, you have more of a chance of having something go wrong when taking medications.  Who should not have this procedure?  If you are on a blood thinning medication (e.g. Coumadin, Plavix, see list of "Blood Thinners"), or if you have an active infection going on, you should not have the procedure.  If you are taking any blood thinners, please inform your physician.  How should I prepare for this procedure?  Do not eat or drink anything at least six hours prior to the procedure.  Bring a driver with you .  It cannot be a taxi.  Come accompanied by an adult that can drive you back, and that is strong enough to help you if your legs get weak or numb from the local anesthetic.  Take all of your medicines the morning of the procedure with just enough water to swallow them.  If you have diabetes, make sure that you are scheduled to have your procedure done first thing in the morning, whenever possible.  If you have diabetes, take only half of your insulin dose and notify our nurse that you have done so as soon as you arrive at the clinic.  If you are diabetic, but only take blood sugar pills (oral hypoglycemic), then do not take them on the morning of your procedure.  You may take them after you have had the procedure.  Do not take aspirin or any aspirin-containing medications, at least eleven (11) days prior to the procedure.  They may prolong bleeding.  Wear loose fitting clothing that may be easy to take off and that you would not mind if it got stained with Betadine or blood.  Do not wear any jewelry or perfume  Remove any nail coloring.  It will interfere with some of our monitoring equipment.  NOTE: Remember that  this is not meant to be interpreted as a complete list of all possible complications.  Unforeseen problems may occur.  BLOOD THINNERS The following drugs contain aspirin or other products, which can cause increased bleeding during surgery and should not be taken for 2 weeks prior to and 1 week after surgery.  If you should need take something for relief of minor pain, you may take acetaminophen which is found in Tylenol,m Datril, Anacin-3 and Panadol. It is not blood thinner. The products listed below are.  Do not take any of the products listed below in addition to any listed on your instruction sheet.  A.P.C or A.P.C with Codeine Codeine Phosphate Capsules #3 Ibuprofen Ridaura  ABC compound Congesprin Imuran rimadil  Advil Cope Indocin Robaxisal  Alka-Seltzer Effervescent Pain Reliever and Antacid Coricidin or Coricidin-D  Indomethacin Rufen  Alka-Seltzer plus Cold Medicine Cosprin Ketoprofen S-A-C Tablets  Anacin Analgesic Tablets or Capsules Coumadin Korlgesic Salflex  Anacin Extra Strength Analgesic tablets or capsules CP-2 Tablets Lanoril Salicylate  Anaprox Cuprimine Capsules Levenox Salocol  Anexsia-D Dalteparin Magan Salsalate  Anodynos Darvon compound Magnesium Salicylate Sine-off  Ansaid Dasin Capsules Magsal Sodium Salicylate  Anturane Depen Capsules Marnal Soma  APF Arthritis pain formula Dewitt's Pills Measurin Stanback  Argesic Dia-Gesic Meclofenamic Sulfinpyrazone  Arthritis Bayer Timed Release Aspirin Diclofenac Meclomen Sulindac  Arthritis pain formula Anacin Dicumarol Medipren Supac  Analgesic (Safety coated) Arthralgen Diffunasal Mefanamic Suprofen  Arthritis Strength Bufferin Dihydrocodeine Mepro Compound Suprol  Arthropan liquid Dopirydamole Methcarbomol with Aspirin Synalgos  ASA tablets/Enseals Disalcid Micrainin Tagament  Ascriptin Doan's Midol Talwin  Ascriptin A/D Dolene Mobidin Tanderil  Ascriptin Extra Strength Dolobid Moblgesic Ticlid  Ascriptin with Codeine  Doloprin or Doloprin with Codeine Momentum Tolectin  Asperbuf Duoprin Mono-gesic Trendar  Aspergum Duradyne Motrin or Motrin IB Triminicin  Aspirin plain, buffered or enteric coated Durasal Myochrisine Trigesic  Aspirin Suppositories Easprin Nalfon Trillsate  Aspirin with Codeine Ecotrin Regular or Extra Strength Naprosyn Uracel  Atromid-S Efficin Naproxen Ursinus  Auranofin Capsules Elmiron Neocylate Vanquish  Axotal Emagrin Norgesic Verin  Azathioprine Empirin or Empirin with Codeine Normiflo Vitamin E  Azolid Emprazil Nuprin Voltaren  Bayer Aspirin plain, buffered or children's or timed BC Tablets or powders Encaprin Orgaran Warfarin Sodium  Buff-a-Comp Enoxaparin Orudis Zorpin  Buff-a-Comp with Codeine Equegesic Os-Cal-Gesic   Buffaprin Excedrin plain, buffered or Extra Strength Oxalid   Bufferin Arthritis Strength Feldene Oxphenbutazone   Bufferin plain or Extra Strength Feldene Capsules Oxycodone with Aspirin   Bufferin with Codeine Fenoprofen Fenoprofen Pabalate or Pabalate-SF   Buffets II Flogesic Panagesic   Buffinol plain or Extra Strength Florinal or Florinal with Codeine Panwarfarin   Buf-Tabs Flurbiprofen Penicillamine   Butalbital Compound Four-way cold tablets Penicillin   Butazolidin Fragmin Pepto-Bismol   Carbenicillin Geminisyn Percodan   Carna Arthritis Reliever Geopen Persantine   Carprofen Gold's salt Persistin   Chloramphenicol Goody's Phenylbutazone   Chloromycetin Haltrain Piroxlcam   Clmetidine heparin Plaquenil   Cllnoril Hyco-pap Ponstel   Clofibrate Hydroxy chloroquine Propoxyphen         Before stopping any of these medications, be sure to consult the physician who ordered them.  Some, such as Coumadin (Warfarin) are ordered to prevent or treat serious conditions such as "deep thrombosis", "pumonary embolisms", and other heart problems.  The amount of time that you may need off of the medication may also vary with the medication and the reason for which  you were taking it.  If you are taking any of these medications, please make sure you notify your pain physician before you undergo any procedures.

## 2015-10-22 ENCOUNTER — Telehealth: Payer: Self-pay | Admitting: *Deleted

## 2015-10-22 NOTE — Telephone Encounter (Signed)
Message left

## 2015-10-28 ENCOUNTER — Inpatient Hospital Stay: Admission: RE | Admit: 2015-10-28 | Payer: Medicare HMO | Source: Ambulatory Visit

## 2015-10-29 ENCOUNTER — Encounter
Admission: RE | Admit: 2015-10-29 | Discharge: 2015-10-29 | Disposition: A | Discharge status: Home / Self Care | Payer: Medicare HMO | Source: Ambulatory Visit | Attending: Vascular Surgery | Admitting: Vascular Surgery

## 2015-10-29 ENCOUNTER — Ambulatory Visit
Admission: RE | Admit: 2015-10-29 | Discharge: 2015-10-29 | Disposition: A | Discharge status: Home / Self Care | Payer: Medicare HMO | Source: Ambulatory Visit | Attending: Vascular Surgery | Admitting: Vascular Surgery

## 2015-10-29 DIAGNOSIS — Z01818 Encounter for other preprocedural examination: Secondary | ICD-10-CM | POA: Diagnosis not present

## 2015-10-29 LAB — CBC WITH DIFFERENTIAL/PLATELET
BASOS PCT: 1 %
Basophils Absolute: 0 10*3/uL (ref 0–0.1)
EOS ABS: 0.2 10*3/uL (ref 0–0.7)
Eosinophils Relative: 2 %
HEMATOCRIT: 30.6 % — AB (ref 35.0–47.0)
Hemoglobin: 9.9 g/dL — ABNORMAL LOW (ref 12.0–16.0)
Lymphocytes Relative: 24 %
Lymphs Abs: 2 10*3/uL (ref 1.0–3.6)
MCH: 28.1 pg (ref 26.0–34.0)
MCHC: 32.3 g/dL (ref 32.0–36.0)
MCV: 87 fL (ref 80.0–100.0)
MONO ABS: 0.8 10*3/uL (ref 0.2–0.9)
MONOS PCT: 10 %
Neutro Abs: 5.5 10*3/uL (ref 1.4–6.5)
Neutrophils Relative %: 63 %
Platelets: 169 10*3/uL (ref 150–440)
RBC: 3.51 MIL/uL — ABNORMAL LOW (ref 3.80–5.20)
RDW: 17.5 % — AB (ref 11.5–14.5)
WBC: 8.6 10*3/uL (ref 3.6–11.0)

## 2015-10-29 LAB — BASIC METABOLIC PANEL
Anion gap: 6 (ref 5–15)
BUN: 73 mg/dL — ABNORMAL HIGH (ref 6–20)
CALCIUM: 8.9 mg/dL (ref 8.9–10.3)
CO2: 19 mmol/L — AB (ref 22–32)
CREATININE: 6.36 mg/dL — AB (ref 0.44–1.00)
Chloride: 113 mmol/L — ABNORMAL HIGH (ref 101–111)
GFR calc Af Amer: 7 mL/min — ABNORMAL LOW (ref >60)
GFR calc non Af Amer: 6 mL/min — ABNORMAL LOW (ref >60)
GLUCOSE: 93 mg/dL (ref 65–99)
Potassium: 4.3 mmol/L (ref 3.5–5.1)
Sodium: 138 mmol/L (ref 135–145)

## 2015-10-29 LAB — ABO/RH: ABO/RH(D): O POS

## 2015-10-29 LAB — TYPE AND SCREEN
ABO/RH(D): O POS
Antibody Screen: NEGATIVE

## 2015-10-29 LAB — SURGICAL PCR SCREEN
MRSA, PCR: NEGATIVE
Staphylococcus aureus: NEGATIVE

## 2015-10-29 LAB — APTT: aPTT: 34 seconds (ref 24–36)

## 2015-10-29 LAB — PROTIME-INR
INR: 1.13
PROTHROMBIN TIME: 14.7 s (ref 11.4–15.0)

## 2015-10-29 NOTE — Patient Instructions (Addendum)
  Your procedure is scheduled on: 11/04/15 Report to Day Surgery. To find out your arrival time please call [phone] between 1PM - 3PM on 11/03/15.  Remember: Instructions that are not followed completely may result in serious medical risk, up to and including death, or upon the discretion of your surgeon and anesthesiologist your surgery may need to be rescheduled.    __x__ 1. Do not eat food or drink liquids after midnight. No gum chewing or hard candies.     __x__ 2. No Alcohol for 24 hours before or after surgery.   ____ 3. Bring all medications with you on the day of surgery if instructed.    __x__ 4. Notify your doctor if there is any change in your medical condition     (cold, fever, infections).     Do not wear jewelry, make-up, hairpins, clips or nail polish.  Do not wear lotions, powders, or perfumes. You may wear deodorant.  Do not shave 48 hours prior to surgery. Men may shave face and neck.  Do not bring valuables to the hospital.    [facility] is not responsible for any belongings or valuables.               Contacts, dentures or bridgework may not be worn into surgery.  Leave your suitcase in the car. After surgery it may be brought to your room.  For patients admitted to the hospital, discharge time is determined by your                treatment team.   Patients discharged the day of surgery will not be allowed to drive home.   Please read over the following fact sheets that you were given:   MRSA Information and Surgical Site Infection Prevention   ____ Take these medicines the morning of surgery with A SIP OF WATER:    1. Amlodipine  2. metoprolol  3. pantoprazole  4.pain medication  5.  6.  ____ Fleet Enema (as directed)   __x__ Use CHG Soap as directed  __x__ Use inhalers on the day of surgery  ____ Stop metformin 2 days prior to surgery    ____ Take 1/2 of usual insulin dose the night before surgery and none on the morning of surgery.   __x__  Continue aspirin   __x__ Stop Anti-inflammatories on today.  Take Tylenol only   __x_ Stop supplements until after surgery.    ____ Bring C-Pap to the hospital.

## 2015-11-04 ENCOUNTER — Ambulatory Visit
Admission: RE | Admit: 2015-11-04 | Discharge: 2015-11-04 | Disposition: A | Discharge status: Home / Self Care | Payer: Medicare HMO | Source: Ambulatory Visit | Attending: Vascular Surgery | Admitting: Vascular Surgery

## 2015-11-04 ENCOUNTER — Ambulatory Visit: Payer: Medicare HMO | Admitting: Certified Registered Nurse Anesthetist

## 2015-11-04 ENCOUNTER — Encounter
Admission: RE | Disposition: A | Discharge status: Home / Self Care | Payer: Self-pay | Source: Ambulatory Visit | Attending: Vascular Surgery

## 2015-11-04 ENCOUNTER — Encounter: Payer: Self-pay | Admitting: *Deleted

## 2015-11-04 DIAGNOSIS — F17210 Nicotine dependence, cigarettes, uncomplicated: Secondary | ICD-10-CM | POA: Diagnosis not present

## 2015-11-04 DIAGNOSIS — R079 Chest pain, unspecified: Secondary | ICD-10-CM | POA: Insufficient documentation

## 2015-11-04 DIAGNOSIS — E785 Hyperlipidemia, unspecified: Secondary | ICD-10-CM | POA: Diagnosis not present

## 2015-11-04 DIAGNOSIS — Z8249 Family history of ischemic heart disease and other diseases of the circulatory system: Secondary | ICD-10-CM | POA: Diagnosis not present

## 2015-11-04 DIAGNOSIS — I8390 Asymptomatic varicose veins of unspecified lower extremity: Secondary | ICD-10-CM | POA: Diagnosis not present

## 2015-11-04 DIAGNOSIS — Z8673 Personal history of transient ischemic attack (TIA), and cerebral infarction without residual deficits: Secondary | ICD-10-CM | POA: Diagnosis not present

## 2015-11-04 DIAGNOSIS — Z833 Family history of diabetes mellitus: Secondary | ICD-10-CM | POA: Insufficient documentation

## 2015-11-04 DIAGNOSIS — J439 Emphysema, unspecified: Secondary | ICD-10-CM | POA: Diagnosis not present

## 2015-11-04 DIAGNOSIS — I120 Hypertensive chronic kidney disease with stage 5 chronic kidney disease or end stage renal disease: Secondary | ICD-10-CM | POA: Diagnosis not present

## 2015-11-04 DIAGNOSIS — Z6838 Body mass index (BMI) 38.0-38.9, adult: Secondary | ICD-10-CM | POA: Diagnosis not present

## 2015-11-04 DIAGNOSIS — Z809 Family history of malignant neoplasm, unspecified: Secondary | ICD-10-CM | POA: Diagnosis not present

## 2015-11-04 DIAGNOSIS — E1122 Type 2 diabetes mellitus with diabetic chronic kidney disease: Secondary | ICD-10-CM | POA: Diagnosis not present

## 2015-11-04 DIAGNOSIS — Z955 Presence of coronary angioplasty implant and graft: Secondary | ICD-10-CM | POA: Insufficient documentation

## 2015-11-04 DIAGNOSIS — N186 End stage renal disease: Secondary | ICD-10-CM | POA: Insufficient documentation

## 2015-11-04 DIAGNOSIS — M4806 Spinal stenosis, lumbar region: Secondary | ICD-10-CM | POA: Insufficient documentation

## 2015-11-04 DIAGNOSIS — Z7984 Long term (current) use of oral hypoglycemic drugs: Secondary | ICD-10-CM | POA: Insufficient documentation

## 2015-11-04 DIAGNOSIS — E669 Obesity, unspecified: Secondary | ICD-10-CM | POA: Diagnosis not present

## 2015-11-04 DIAGNOSIS — I6529 Occlusion and stenosis of unspecified carotid artery: Secondary | ICD-10-CM | POA: Insufficient documentation

## 2015-11-04 DIAGNOSIS — Z79899 Other long term (current) drug therapy: Secondary | ICD-10-CM | POA: Diagnosis not present

## 2015-11-04 HISTORY — PX: AV FISTULA PLACEMENT: SHX1204

## 2015-11-04 LAB — GLUCOSE, CAPILLARY
Glucose-Capillary: 105 mg/dL — ABNORMAL HIGH (ref 65–99)
Glucose-Capillary: 119 mg/dL — ABNORMAL HIGH (ref 65–99)

## 2015-11-04 SURGERY — ARTERIOVENOUS (AV) FISTULA CREATION
Anesthesia: General | Laterality: Left | Wound class: Clean

## 2015-11-04 MED ORDER — HYDROCODONE-ACETAMINOPHEN 5-325 MG PO TABS: 1.0000 | ORAL_TABLET | Freq: Four times a day (QID) | ORAL | Status: DC | PRN

## 2015-11-04 MED ORDER — NITROGLYCERIN 2 % TD OINT
1.0000 [in_us] | TOPICAL_OINTMENT | Freq: Once | TRANSDERMAL | Status: AC
  Administered 2015-11-04: 1 [in_us] via TOPICAL
  Filled 2015-11-04: qty 30

## 2015-11-04 MED ORDER — PHENYLEPHRINE HCL 10 MG/ML IJ SOLN
INTRAMUSCULAR | Status: DC | PRN
  Administered 2015-11-04 (×2): 100 ug via INTRAVENOUS

## 2015-11-04 MED ORDER — MIDAZOLAM HCL 2 MG/2ML IJ SOLN
INTRAMUSCULAR | Status: DC | PRN
  Administered 2015-11-04: 1 mg via INTRAVENOUS

## 2015-11-04 MED ORDER — NITROGLYCERIN 2 % TD OINT
TOPICAL_OINTMENT | TRANSDERMAL | Status: AC
  Administered 2015-11-04: 1 [in_us] via TOPICAL
  Filled 2015-11-04: qty 1

## 2015-11-04 MED ORDER — SUCCINYLCHOLINE CHLORIDE 20 MG/ML IJ SOLN
INTRAMUSCULAR | Status: DC | PRN
  Administered 2015-11-04: 100 mg via INTRAVENOUS

## 2015-11-04 MED ORDER — FENTANYL CITRATE (PF) 100 MCG/2ML IJ SOLN
25.0000 ug | INTRAMUSCULAR | Status: DC | PRN
  Administered 2015-11-04 (×3): 25 ug via INTRAVENOUS

## 2015-11-04 MED ORDER — NEOSTIGMINE METHYLSULFATE 10 MG/10ML IV SOLN
INTRAVENOUS | Status: DC | PRN
  Administered 2015-11-04: 3 mg via INTRAVENOUS

## 2015-11-04 MED ORDER — FENTANYL CITRATE (PF) 100 MCG/2ML IJ SOLN
INTRAMUSCULAR | Status: AC
  Administered 2015-11-04: 25 ug via INTRAVENOUS
  Filled 2015-11-04: qty 2

## 2015-11-04 MED ORDER — SODIUM CHLORIDE 0.9 % IV SOLN
INTRAVENOUS | Status: DC | PRN
  Administered 2015-11-04: 50 mL via INTRAMUSCULAR

## 2015-11-04 MED ORDER — CEFAZOLIN SODIUM-DEXTROSE 2-3 GM-% IV SOLR
INTRAVENOUS | Status: AC
  Filled 2015-11-04: qty 50

## 2015-11-04 MED ORDER — ONDANSETRON HCL 4 MG/2ML IJ SOLN: 4.0000 mg | Freq: Once | INTRAMUSCULAR | Status: DC | PRN

## 2015-11-04 MED ORDER — ONDANSETRON HCL 4 MG/2ML IJ SOLN
INTRAMUSCULAR | Status: DC | PRN
  Administered 2015-11-04: 4 mg via INTRAVENOUS

## 2015-11-04 MED ORDER — FENTANYL CITRATE (PF) 100 MCG/2ML IJ SOLN
INTRAMUSCULAR | Status: DC | PRN
  Administered 2015-11-04 (×2): 50 ug via INTRAVENOUS

## 2015-11-04 MED ORDER — GLYCOPYRROLATE 0.2 MG/ML IJ SOLN
INTRAMUSCULAR | Status: DC | PRN
  Administered 2015-11-04: .4 mg via INTRAVENOUS

## 2015-11-04 MED ORDER — NITROGLYCERIN 0.4 MG SL SUBL
0.4000 mg | SUBLINGUAL_TABLET | Freq: Once | SUBLINGUAL | Status: AC
  Administered 2015-11-04: 0.4 mg via SUBLINGUAL

## 2015-11-04 MED ORDER — PROPOFOL 10 MG/ML IV BOLUS
INTRAVENOUS | Status: DC | PRN
  Administered 2015-11-04: 120 mg via INTRAVENOUS
  Administered 2015-11-04: 40 mg via INTRAVENOUS

## 2015-11-04 MED ORDER — NITROGLYCERIN 0.4 MG SL SUBL
SUBLINGUAL_TABLET | SUBLINGUAL | Status: AC
  Administered 2015-11-04: 0.4 mg via SUBLINGUAL
  Filled 2015-11-04: qty 1

## 2015-11-04 MED ORDER — ASPIRIN 81 MG PO CHEW
324.0000 mg | CHEWABLE_TABLET | Freq: Once | ORAL | Status: AC
  Administered 2015-11-04: 81 mg via ORAL
  Filled 2015-11-04 (×2): qty 4

## 2015-11-04 MED ORDER — ROCURONIUM BROMIDE 100 MG/10ML IV SOLN
INTRAVENOUS | Status: DC | PRN
  Administered 2015-11-04: 10 mg via INTRAVENOUS
  Administered 2015-11-04: 5 mg via INTRAVENOUS
  Administered 2015-11-04: 10 mg via INTRAVENOUS

## 2015-11-04 MED ORDER — LIDOCAINE HCL (CARDIAC) 20 MG/ML IV SOLN
INTRAVENOUS | Status: DC | PRN
  Administered 2015-11-04: 60 mg via INTRAVENOUS

## 2015-11-04 MED ORDER — CEFAZOLIN SODIUM-DEXTROSE 2-3 GM-% IV SOLR
2.0000 g | INTRAVENOUS | Status: AC
  Administered 2015-11-04: 2 g via INTRAVENOUS

## 2015-11-04 MED ORDER — SODIUM CHLORIDE 0.9 % IV SOLN
INTRAVENOUS | Status: DC
  Administered 2015-11-04: 11:00:00 via INTRAVENOUS

## 2015-11-04 SURGICAL SUPPLY — 54 items
APPLIER CLIP 11 MED OPEN (CLIP)
APPLIER CLIP 9.375 SM OPEN (CLIP)
BAG DECANTER FOR FLEXI CONT (MISCELLANEOUS) ×3 IMPLANT
BLADE SURG SZ11 CARB STEEL (BLADE) ×3 IMPLANT
BOOT SUTURE AID YELLOW STND (SUTURE) ×3 IMPLANT
BRUSH SCRUB 4% CHG (MISCELLANEOUS) ×3 IMPLANT
CANISTER SUCT 1200ML W/VALVE (MISCELLANEOUS) ×3 IMPLANT
CHLORAPREP W/TINT 26ML (MISCELLANEOUS) ×3 IMPLANT
CLIP APPLIE 11 MED OPEN (CLIP) IMPLANT
CLIP APPLIE 9.375 SM OPEN (CLIP) IMPLANT
DRESSING SURGICEL FIBRLLR 1X2 (HEMOSTASIS) ×1 IMPLANT
DRSG SURGICEL FIBRILLAR 1X2 (HEMOSTASIS) ×3
ELECT CAUTERY BLADE 6.4 (BLADE) ×3 IMPLANT
ELECT REM PT RETURN 9FT ADLT (ELECTROSURGICAL) ×3
ELECTRODE REM PT RTRN 9FT ADLT (ELECTROSURGICAL) ×1 IMPLANT
GEL ULTRASOUND 20GR AQUASONIC (MISCELLANEOUS) IMPLANT
GLOVE BIO SURGEON STRL SZ7 (GLOVE) ×6 IMPLANT
GLOVE INDICATOR 7.5 STRL GRN (GLOVE) ×6 IMPLANT
GLOVE SURG SYN 8.0 (GLOVE) ×3 IMPLANT
GOWN STRL REUS W/ TWL LRG LVL3 (GOWN DISPOSABLE) ×1 IMPLANT
GOWN STRL REUS W/ TWL XL LVL3 (GOWN DISPOSABLE) ×1 IMPLANT
GOWN STRL REUS W/TWL LRG LVL3 (GOWN DISPOSABLE) ×2
GOWN STRL REUS W/TWL XL LVL3 (GOWN DISPOSABLE) ×2
IV NS 500ML (IV SOLUTION) ×2
IV NS 500ML BAXH (IV SOLUTION) ×1 IMPLANT
KIT RM TURNOVER STRD PROC AR (KITS) ×3 IMPLANT
LABEL OR SOLS (LABEL) ×3 IMPLANT
LIQUID BAND (GAUZE/BANDAGES/DRESSINGS) ×3 IMPLANT
LOOP RED MAXI  1X406MM (MISCELLANEOUS) ×2
LOOP VESSEL MAXI 1X406 RED (MISCELLANEOUS) ×1 IMPLANT
LOOP VESSEL MINI 0.8X406 BLUE (MISCELLANEOUS) ×2 IMPLANT
LOOPS BLUE MINI 0.8X406MM (MISCELLANEOUS) ×4
NEEDLE FILTER BLUNT 18X 1/2SAF (NEEDLE) ×2
NEEDLE FILTER BLUNT 18X1 1/2 (NEEDLE) ×1 IMPLANT
NEEDLE HYPO 30X.5 LL (NEEDLE) IMPLANT
NS IRRIG 500ML POUR BTL (IV SOLUTION) IMPLANT
PACK EXTREMITY ARMC (MISCELLANEOUS) ×3 IMPLANT
PAD PREP 24X41 OB/GYN DISP (PERSONAL CARE ITEMS) ×3 IMPLANT
PUNCH SURGICAL ROTATE 2.7MM (MISCELLANEOUS) IMPLANT
STOCKINETTE STRL 4IN 9604848 (GAUZE/BANDAGES/DRESSINGS) ×3 IMPLANT
SUT MNCRL+ 5-0 UNDYED PC-3 (SUTURE) ×1 IMPLANT
SUT MONOCRYL 5-0 (SUTURE) ×2
SUT PROLENE 6 0 BV (SUTURE) ×9 IMPLANT
SUT SILK 2 0 (SUTURE) ×2
SUT SILK 2-0 18XBRD TIE 12 (SUTURE) ×1 IMPLANT
SUT SILK 3 0 (SUTURE) ×2
SUT SILK 3-0 18XBRD TIE 12 (SUTURE) ×1 IMPLANT
SUT SILK 4 0 (SUTURE) ×2
SUT SILK 4-0 18XBRD TIE 12 (SUTURE) ×1 IMPLANT
SUT VIC AB 3-0 SH 27 (SUTURE) ×2
SUT VIC AB 3-0 SH 27X BRD (SUTURE) ×1 IMPLANT
SYR 20CC LL (SYRINGE) ×3 IMPLANT
SYR 3ML LL SCALE MARK (SYRINGE) ×3 IMPLANT
TOWEL OR 17X26 4PK STRL BLUE (TOWEL DISPOSABLE) IMPLANT

## 2015-11-04 NOTE — Anesthesia Procedure Notes (Signed)
Procedure Name: Intubation Date/Time: 11/04/2015 12:30 PM Performed by: Johnna Acosta Pre-anesthesia Checklist: Patient identified, Emergency Drugs available, Suction available, Patient being monitored and Timeout performed Patient Re-evaluated:Patient Re-evaluated prior to inductionOxygen Delivery Method: Circle system utilized Preoxygenation: Pre-oxygenation with 100% oxygen Intubation Type: IV induction Ventilation: Mask ventilation without difficulty Laryngoscope Size: Miller and 2 Grade View: Grade I Tube type: Oral Tube size: 7.0 mm Number of attempts: 1 Airway Equipment and Method: Stylet Placement Confirmation: ETT inserted through vocal cords under direct vision,  positive ETCO2 and breath sounds checked- equal and bilateral Secured at: 21 cm Tube secured with: Tape Dental Injury: Teeth and Oropharynx as per pre-operative assessment

## 2015-11-04 NOTE — Progress Notes (Signed)
Chest pain to left side of chest  ntg given

## 2015-11-04 NOTE — Progress Notes (Signed)
Pt states 9 out of 10  Goes back to sleep when not stimulated

## 2015-11-04 NOTE — Op Note (Signed)
     OPERATIVE NOTE   PROCEDURE: left brachial cephalic arteriovenous fistula placement  PRE-OPERATIVE DIAGNOSIS: End Stage Renal Disease  POST-OPERATIVE DIAGNOSIS: End Stage Renal Disease  SURGEON: Schnier, Dolores Lory  ASSISTANT(S): None  ANESTHESIA: general  ESTIMATED BLOOD LOSS: <50 cc  FINDING(S): 5 mm cephalic vein  SPECIMEN(S):  none  INDICATIONS:   Kimberly Shaffer is a 65 y.o. female who presents with end stage renal disease.  The patient is scheduled for left brachiocephalic arteriovenous fistula placement.  The patient is aware the risks include but are not limited to: bleeding, infection, steal syndrome, nerve damage, ischemic monomelic neuropathy, failure to mature, and need for additional procedures.  The patient is aware of the risks of the procedure and elects to proceed forward.  DESCRIPTION: After full informed written consent was obtained from the patient, the patient was brought back to the operating room and placed supine upon the operating table.  Prior to induction, the patient received IV antibiotics.   After obtaining adequate anesthesia, the patient was then prepped and draped in the standard fashion for a left arm access procedure.    A curvilinear incision was then created midway between the radial impulse and the cephalic vein. The cephalic vein was then identified and dissected circumferentially. It was marked with a surgical marker.    Attention was then turned to the brachial artery which was exposed through the same incision and looped proximally and distally. Side branches were controlled with 4-0 silk ties.  The distal segment of the vein was ligated with a  2-0 silk, and the vein was transected.  The proximal segment was interrogated with serial dilators.  The vein accepted up to a 4 mm dilator without any difficulty. Heparinized saline was infused into the vein and clamped it with a small bulldog.  At this point, I reset my exposure of the  brachial artery and controlled the artery with vessel loops proximally and distally.  An arteriotomy was then made with a #11 blade, and extended with a Potts scissor.  Heparinized saline was injected proximal and distal into the radial artery.  The vein was then approximated to the artery while the artery was in its native bed and subsequently the vein was beveled using Potts scissors. The vein was then sewn to the artery in an end-to-side configuration with a running stitch of 6-0 Prolene.  Prior to completing this anastomosis Flushing maneuvers were performed and the artery was allowed to forward and back bleed.  There was no evidence of clot from any vessels.  I completed the anastomosis in the usual fashion and then released all vessel loops and clamps.    There was good  thrill in the venous outflow, and there was 1+ palpable radial pulse.  At this point, I irrigated out the surgical wound.  There was no further active bleeding.  The subcutaneous tissue was reapproximated with a running stitch of 3-0 Vicryl.  The skin was then reapproximated with a running subcuticular stitch of 4-0 Vicryl.  The skin was then cleaned, dried, and reinforced with Dermabond.    The patient tolerated this procedure well.   COMPLICATIONS: None  CONDITION: [facility]  Office: [phone]   11/04/2015, 1:57 PM

## 2015-11-04 NOTE — Progress Notes (Signed)
Pt assessment of arm the same as before

## 2015-11-04 NOTE — Discharge Instructions (Signed)

## 2015-11-04 NOTE — Progress Notes (Signed)
Asa given   Applied oxygen for support  Blood pressure better chest pain better

## 2015-11-04 NOTE — H&P (Signed)
[facility] History & Physical Update  The patient was interviewed and re-examined.  The patient's previous History and Physical has been reviewed and is unchanged.  There is no change in the plan of care. We plan to proceed with the scheduled procedure.  Travaris Kosh, Dolores Lory, MD  11/04/2015, 12:06 PM

## 2015-11-04 NOTE — Transfer of Care (Signed)
Immediate Anesthesia Transfer of Care Note  Patient: Kimberly Shaffer  Procedure(s) Performed: Procedure(s): ARTERIOVENOUS (AV) FISTULA CREATION  ( BRACHIAL CEPHALIC ) (Left)  Patient Location: PACU  Anesthesia Type:General  Level of Consciousness: sedated  Airway & Oxygen Therapy: Patient Spontanous Breathing and Patient connected to face mask oxygen  Post-op Assessment: Report given to RN and Post -op Vital signs reviewed and stable  Post vital signs: Reviewed and stable  Last Vitals:  Filed Vitals:   11/04/15 1021  BP: 177/80  Pulse: 74  Temp: 36.7 C  Resp: 16    Complications: No apparent anesthesia complications

## 2015-11-04 NOTE — Anesthesia Preprocedure Evaluation (Signed)
Anesthesia Evaluation  Patient identified by MRN, date of birth, ID band Patient awake    Reviewed: Allergy & Precautions, NPO status , Patient's Chart, lab work & pertinent test results  Airway Mallampati: III  TM Distance: >3 FB     Dental  (+) Partial Upper, Partial Lower   Pulmonary COPD, Current Smoker,    Pulmonary exam normal breath sounds clear to auscultation       Cardiovascular hypertension, + CAD and + Past MI  Normal cardiovascular exam     Neuro/Psych Depression Lumbar radiculopathy CVA    GI/Hepatic   Endo/Other  diabetes, Type 2  Renal/GU      Musculoskeletal  (+) Arthritis , Osteoarthritis,    Abdominal Normal abdominal exam  (+)   Peds  Hematology negative hematology ROS (+)   Anesthesia Other Findings   Reproductive/Obstetrics                             Anesthesia Physical Anesthesia Plan  ASA: III  Anesthesia Plan: General   Post-op Pain Management:    Induction: Intravenous  Airway Management Planned: Oral ETT  Additional Equipment:   Intra-op Plan:   Post-operative Plan: Extubation in OR  Informed Consent: I have reviewed the patients History and Physical, chart, labs and discussed the procedure including the risks, benefits and alternatives for the proposed anesthesia with the patient or authorized representative who has indicated his/her understanding and acceptance.   Dental advisory given  Plan Discussed with: CRNA and Surgeon  Anesthesia Plan Comments:         Anesthesia Quick Evaluation

## 2015-11-04 NOTE — Progress Notes (Signed)
Dr schnier called dr Josefa Half ekg done   Dr Josefa Half in to see pt    Pt chest pain gone after asa and ntg   Blood pressure 104/80   Heart rate 68   Pain to left arm better after fentanyl  Sat on oxygen 2 liters 96   Left radial pulse not felt but heard bruit and no thrill      Dr schnier aware of these findings

## 2015-11-04 NOTE — Progress Notes (Signed)
Pt resting well removed ntg paste   Dr Josefa Half called back to say okay to let pt go

## 2015-11-05 ENCOUNTER — Encounter: Payer: Self-pay | Admitting: Pain Medicine

## 2015-11-05 ENCOUNTER — Ambulatory Visit: Payer: Medicare HMO | Attending: Pain Medicine | Admitting: Pain Medicine

## 2015-11-05 VITALS — BP 143/66 | HR 71 | Temp 98.4°F | Resp 16 | Ht 66.5 in | Wt 240.0 lb

## 2015-11-05 DIAGNOSIS — M172 Bilateral post-traumatic osteoarthritis of knee: Secondary | ICD-10-CM

## 2015-11-05 DIAGNOSIS — M1610 Unilateral primary osteoarthritis, unspecified hip: Secondary | ICD-10-CM | POA: Insufficient documentation

## 2015-11-05 DIAGNOSIS — M19011 Primary osteoarthritis, right shoulder: Secondary | ICD-10-CM

## 2015-11-05 DIAGNOSIS — M160 Bilateral primary osteoarthritis of hip: Secondary | ICD-10-CM

## 2015-11-05 DIAGNOSIS — M5116 Intervertebral disc disorders with radiculopathy, lumbar region: Secondary | ICD-10-CM | POA: Insufficient documentation

## 2015-11-05 DIAGNOSIS — M19012 Primary osteoarthritis, left shoulder: Secondary | ICD-10-CM | POA: Diagnosis not present

## 2015-11-05 DIAGNOSIS — M5416 Radiculopathy, lumbar region: Secondary | ICD-10-CM

## 2015-11-05 DIAGNOSIS — M25512 Pain in left shoulder: Secondary | ICD-10-CM | POA: Diagnosis present

## 2015-11-05 DIAGNOSIS — Z96653 Presence of artificial knee joint, bilateral: Secondary | ICD-10-CM

## 2015-11-05 DIAGNOSIS — M47817 Spondylosis without myelopathy or radiculopathy, lumbosacral region: Secondary | ICD-10-CM

## 2015-11-05 DIAGNOSIS — M5126 Other intervertebral disc displacement, lumbar region: Secondary | ICD-10-CM | POA: Diagnosis not present

## 2015-11-05 DIAGNOSIS — M159 Polyosteoarthritis, unspecified: Secondary | ICD-10-CM

## 2015-11-05 DIAGNOSIS — M7060 Trochanteric bursitis, unspecified hip: Secondary | ICD-10-CM

## 2015-11-05 DIAGNOSIS — M5137 Other intervertebral disc degeneration, lumbosacral region: Secondary | ICD-10-CM

## 2015-11-05 DIAGNOSIS — M533 Sacrococcygeal disorders, not elsewhere classified: Secondary | ICD-10-CM

## 2015-11-05 DIAGNOSIS — M25511 Pain in right shoulder: Secondary | ICD-10-CM | POA: Diagnosis present

## 2015-11-05 DIAGNOSIS — M150 Primary generalized (osteo)arthritis: Secondary | ICD-10-CM

## 2015-11-05 DIAGNOSIS — M51379 Other intervertebral disc degeneration, lumbosacral region without mention of lumbar back pain or lower extremity pain: Secondary | ICD-10-CM

## 2015-11-05 DIAGNOSIS — M170 Bilateral primary osteoarthritis of knee: Secondary | ICD-10-CM

## 2015-11-05 DIAGNOSIS — M461 Sacroiliitis, not elsewhere classified: Secondary | ICD-10-CM

## 2015-11-05 MED ORDER — OXYCODONE HCL 10 MG PO TABS: ORAL_TABLET | ORAL | Status: DC

## 2015-11-05 NOTE — Progress Notes (Signed)
Safety precautions to be maintained throughout the outpatient stay will include: orient to surroundings, keep bed in low position, maintain call bell within reach at all times, provide assistance with transfer out of bed and ambulation.  

## 2015-11-05 NOTE — Progress Notes (Signed)
Subjective:    Patient ID: Kimberly Shaffer, female    DOB: 07/04/1951, 65 y.o.   MRN: [id number]  HPI  The patient is a 65 year old female who returns to pain management for further evaluation and treatment of pain involving the shoulders entire back upper and lower extremity region. The patient recently was diagnosed as having renal failure and recently received placement of catheter in the left upper extremity for hemodialysis. The patient states that the pain is fairly well-controlled with the present dose of oxycodone. The patient has history of pain involving the shoulders lower back lower extremity region knees hips especially. The patient denies any recent trauma change in events of daily living the call significant change in symptomatology. We will continue oxycodone as prescribed at this time and we'll remain available to consider patient for interventional treatment should pain be without significant controlled with noninterventional treatment measures. The patient was in agreement with suggested treatment plan. The patient continues under the care of her primary care physician Dr.Abrill and will see her nephrologist [facility] Friday. We will remain available to consider patient for modifications of treatment regimen as needed. All agreed with suggested treatment plan       Review of Systems     Objective:   Physical Exam   There was tenderness of the splenius capitis and occipitalis musculature regions palpation which be produced pain of moderate degree with palpation of the cervical facet cervical paraspinal musculature regions reproducing moderate discomfort. There was moderate tenderness of the thoracic facet thoracic paraspinal musculature region. No crepitus of the thoracic region was noted. Palpation of the acromioclavicular and glenohumeral joint regions reproduced pain of moderate degree. Patient was with limited range of motion of the shoulders and was with mild to moderate  difficulty performing drop test. Palpation of the region of the lumbar paraspinal must reason lumbar facet region was attends to palpation of moderate degree with lateral bending and rotation extension and palpation of the lumbar facets reproducing moderate discomfort.. Palpation of the PSIS and PII S regions reproduced pain of moderate degree with mild to moderate tenderness of the greater trochanteric region and iliotibial band region. Straight leg raise was limited to 20 without a definite increase of pain with dorsiflexion noted. No definite sensory deficit or dermatomal distribution detected. There were surgical scars of the knees. Patient is status post total knee replacement). EHL strength appeared to be slightly decreased. There was no definite sensory deficit of dermatomal distribution detected. There was negative clonus negative Homans. Abdomen was nontender with no costovertebral tenderness noted      Assessment & Plan:   Degenerative joint disease of shoulders  Degenerative joint disease of hip  Degenerative disc disease lumbar spine   L1 to, L2-3, L3-4, L4-5, and L5-S1 with multilevel degenerative disc disease, disc bulging, broad-based disc bulging, left paracentral disc protrusion, superior margination of disc material, bilateral facet arthropathy, L1-2 and L2-3 and L3-4 with bilateral facet arthropathy as well as L4-5 and L5-S1.   Lumbar radiculopathy  Lumbar facet syndrome   Sacroiliac joint dysfunction     PLAN   Continue present medication oxycodone  F/U PCP Dr. Julieanne Cotton  for evaliation of  BP and general medical  condition  F/U surgical evaluation as discussed. Appt ENT today  F/U neurological evaluation  F/U with vascular regarding your shunt for hemodialysis  Appointment with Dr. Holley Raring  this week as planned  F/U Drs. McDougal as discussed  May consider radiofrequency rhizolysis  or intraspinal procedures pending response to  present treatment and F/U evaluation   Patient to call [facility] should patient have concerns prior to scheduled return appointment.

## 2015-11-05 NOTE — Patient Instructions (Addendum)
PLAN   Continue present medication oxycodone  F/U PCP Dr. Julieanne Cotton  for evaliation of  BP and general medical  condition  F/U surgical evaluation as discussed. Appt ENT today  F/U neurological evaluation  F/U with vascular regarding your shunt for hemodialysis  Appointment with Dr. Holley Raring  this week as planned  F/U Drs. Kathleen as discussed  May consider radiofrequency rhizolysis or intraspinal procedures pending response to present treatment and F/U evaluation   Patient to call [facility] should patient have concerns prior to scheduled return appointment.

## 2015-11-11 NOTE — Anesthesia Postprocedure Evaluation (Signed)
Anesthesia Post Note  Patient: Kimberly Shaffer  Procedure(s) Performed: Procedure(s) (LRB): ARTERIOVENOUS (AV) FISTULA CREATION  ( BRACHIAL CEPHALIC ) (Left)  Patient location during evaluation: PACU Level of consciousness: awake and alert and oriented Pain management: pain level controlled Vital Signs Assessment: post-procedure vital signs reviewed and stable Respiratory status: spontaneous breathing Cardiovascular status: blood pressure returned to baseline Anesthetic complications: no    Last Vitals:  Filed Vitals:   11/04/15 1546 11/04/15 1633  BP: 139/66 144/60  Pulse: 63 64  Temp: 36.6 C   Resp: 16 16    Last Pain:  Filed Vitals:   11/05/15 1121  PainSc: 0-No pain                 [name]

## 2015-11-13 ENCOUNTER — Encounter
Admission: RE | Disposition: A | Discharge status: Home / Self Care | Payer: Self-pay | Source: Ambulatory Visit | Attending: Vascular Surgery

## 2015-11-13 ENCOUNTER — Encounter: Payer: Self-pay | Admitting: *Deleted

## 2015-11-13 ENCOUNTER — Ambulatory Visit
Admission: RE | Admit: 2015-11-13 | Discharge: 2015-11-13 | Disposition: A | Discharge status: Home / Self Care | Payer: Medicare HMO | Source: Ambulatory Visit | Attending: Vascular Surgery | Admitting: Vascular Surgery

## 2015-11-13 DIAGNOSIS — N186 End stage renal disease: Secondary | ICD-10-CM | POA: Diagnosis present

## 2015-11-13 DIAGNOSIS — I6529 Occlusion and stenosis of unspecified carotid artery: Secondary | ICD-10-CM | POA: Diagnosis not present

## 2015-11-13 DIAGNOSIS — F17200 Nicotine dependence, unspecified, uncomplicated: Secondary | ICD-10-CM | POA: Insufficient documentation

## 2015-11-13 DIAGNOSIS — I70219 Atherosclerosis of native arteries of extremities with intermittent claudication, unspecified extremity: Secondary | ICD-10-CM | POA: Diagnosis not present

## 2015-11-13 DIAGNOSIS — I120 Hypertensive chronic kidney disease with stage 5 chronic kidney disease or end stage renal disease: Secondary | ICD-10-CM | POA: Diagnosis not present

## 2015-11-13 DIAGNOSIS — J439 Emphysema, unspecified: Secondary | ICD-10-CM | POA: Insufficient documentation

## 2015-11-13 DIAGNOSIS — M4806 Spinal stenosis, lumbar region: Secondary | ICD-10-CM | POA: Diagnosis not present

## 2015-11-13 DIAGNOSIS — E1122 Type 2 diabetes mellitus with diabetic chronic kidney disease: Secondary | ICD-10-CM | POA: Diagnosis not present

## 2015-11-13 DIAGNOSIS — R0989 Other specified symptoms and signs involving the circulatory and respiratory systems: Secondary | ICD-10-CM | POA: Insufficient documentation

## 2015-11-13 DIAGNOSIS — Z79899 Other long term (current) drug therapy: Secondary | ICD-10-CM | POA: Diagnosis not present

## 2015-11-13 DIAGNOSIS — E785 Hyperlipidemia, unspecified: Secondary | ICD-10-CM | POA: Insufficient documentation

## 2015-11-13 DIAGNOSIS — Z992 Dependence on renal dialysis: Secondary | ICD-10-CM | POA: Diagnosis not present

## 2015-11-13 HISTORY — PX: PERIPHERAL VASCULAR CATHETERIZATION: SHX172C

## 2015-11-13 SURGERY — DIALYSIS/PERMA CATHETER INSERTION
Anesthesia: Moderate Sedation

## 2015-11-13 MED ORDER — NITROGLYCERIN 0.4 MG SL SUBL
SUBLINGUAL_TABLET | SUBLINGUAL | Status: AC
  Filled 2015-11-13: qty 1

## 2015-11-13 MED ORDER — MIDAZOLAM HCL 2 MG/2ML IJ SOLN
INTRAMUSCULAR | Status: DC | PRN
  Administered 2015-11-13: 1 mg via INTRAVENOUS
  Administered 2015-11-13: 2 mg via INTRAVENOUS

## 2015-11-13 MED ORDER — FENTANYL CITRATE (PF) 100 MCG/2ML IJ SOLN
INTRAMUSCULAR | Status: AC
  Filled 2015-11-13: qty 2

## 2015-11-13 MED ORDER — LIDOCAINE-EPINEPHRINE (PF) 1 %-1:200000 IJ SOLN
INTRAMUSCULAR | Status: AC
  Filled 2015-11-13: qty 30

## 2015-11-13 MED ORDER — MIDAZOLAM HCL 2 MG/2ML IJ SOLN
INTRAMUSCULAR | Status: AC
  Filled 2015-11-13: qty 2

## 2015-11-13 MED ORDER — CEFAZOLIN SODIUM 1-5 GM-% IV SOLN: 1.0000 g | Freq: Once | INTRAVENOUS | Status: DC

## 2015-11-13 MED ORDER — HEPARIN SODIUM (PORCINE) 10000 UNIT/ML IJ SOLN
INTRAMUSCULAR | Status: AC
  Filled 2015-11-13: qty 1

## 2015-11-13 MED ORDER — FENTANYL CITRATE (PF) 100 MCG/2ML IJ SOLN
INTRAMUSCULAR | Status: DC | PRN
  Administered 2015-11-13 (×2): 50 ug via INTRAVENOUS

## 2015-11-13 MED ORDER — SODIUM CHLORIDE 0.9 % IV SOLN
INTRAVENOUS | Status: DC
Start: 2015-11-13 — End: 2015-11-13
  Administered 2015-11-13: 15:00:00 via INTRAVENOUS

## 2015-11-13 MED ORDER — HEPARIN (PORCINE) IN NACL 2-0.9 UNIT/ML-% IJ SOLN
INTRAMUSCULAR | Status: AC
  Filled 2015-11-13: qty 500

## 2015-11-13 SURGICAL SUPPLY — 4 items
CATH PALINDROME RT-P 15FX19CM (CATHETERS) ×3 IMPLANT
GUIDEWIRE AMPLATZ SHORT (WIRE) ×3 IMPLANT
PACK ANGIOGRAPHY (CUSTOM PROCEDURE TRAY) ×3 IMPLANT
SET INTRO CAPELLA COAXIAL (SET/KITS/TRAYS/PACK) ×3 IMPLANT

## 2015-11-13 NOTE — Discharge Instructions (Signed)

## 2015-11-13 NOTE — H&P (Signed)
[facility] History & Physical Update  The patient was interviewed and re-examined.  The patient's previous History and Physical has been reviewed and is unchanged.  There is no change in the plan of care. We plan to proceed with the scheduled procedure.  Erez Mccallum, Dolores Lory, MD  11/13/2015, 3:41 PM

## 2015-11-13 NOTE — Op Note (Signed)
[facility]   OPERATIVE NOTE     PROCEDURE: 1. Insertion right IJ tunneled dialysis catheter placement 2. Catheter placement and cannulation under ultrasound and fluoroscopic guidance  PRE-OPERATIVE DIAGNOSIS: end-stage renal requiring hemodialysis  POST-OPERATIVE DIAGNOSIS: same as above  SURGEON: Katha Cabal, M.D.  ANESTHESIA: Conscious sedation was administered under my direct supervision. IV Versed plus fentanyl were utilized. Continuous ECG, pulse oximetry and blood pressure was monitored throughout the entire procedure. A total of 3 milligrams of Versed and 100 micrograms of fentanyl were utilized.  Conscious sedation was for a total of 6 minutes.  ESTIMATED BLOOD LOSS: Minimal  FINDING(S): 1.  Tips of the catheter in the right atrium on fluoroscopy 2.  No obvious pneumothorax on fluoroscopy  SPECIMEN(S):  none  INDICATIONS:   Kimberly Shaffer is a 65 y.o. female  presents with end stage renal disease.  Therefore, the patient requires a tunneled dialysis catheter placement.  The patient is informed of  the risks catheter placement include but are not limited to: bleeding, infection, central venous injury, pneumothorax, possible venous stenosis, possible malpositioning in the venous system, and possible infections related to long-term catheter presence.  The patient was aware of these risks and agreed to proceed.  DESCRIPTION: The patient was taken back to Special Procedure suite.  Prior to sedation, the patient was given IV antibiotics.  After obtaining adequate sedation, the patient was prepped and draped in the standard fashion for a chest or neck tunneled dialysis catheter placement.  Appropriate Time Out is called.   The the neck and chest wall are then infiltrated with 1% Lidocaine with epinepherine.  A 19 cm tip to cuff palindrome catheter is then selected, opened on the back table and prepped. Under ultrasound guidance, the right internal  jugular vein was cannulated with the Seldinger needle.  A J-wire was then advanced under fluoroscopic guidance into the inferior vena cava and the wire was secured.  Small counter incision was then made at the wire insertion site. A small pocket was fashioned with blunt dissection to allow easier passage of the cuff.  The dilator and peel-away sheath are then advanced over the wire under fluoroscopic guidance. The catheters and advanced through the peel-away sheath after removal of the wire. It is approximated to the chest wall after verifying the tips at the atrial caval junction and an exit site is selected.  Small incision is made at the selected exit site and the tunneling device was passed subcutaneously to the neck counter incision. Catheter is then pulled through the subcutaneous tunnel. The catheter is then verified for tip position under fluoroscopy, transected and the hub assembly connected.    Each port was tested by aspirating and flushing.  No resistance was noted.  Each port was then thoroughly flushed with heparinized saline.  The catheter was secured in placed with two interrupted stitches of 0 silk tied to the catheter.  The neck incision was closed with a U-stitch of 4-0 Monocryl.  The neck and chest incision were cleaned and sterile bandages applied including a Biopatch.  Each port was then packed with concentrated heparin (1000 Units/mL) at the manufacturer recommended volumes to each port.  Sterile caps were applied to each port.  On completion fluoroscopy, the tips of the catheter were in the right atrium, and there was no evidence of pneumothorax.  COMPLICATIONS: None  CONDITION: Good   Katha Cabal M.D. [facility] Office: [phone]   11/13/2015, 4:45 PM

## 2015-11-13 NOTE — OR Nursing (Signed)
Pt arrived from proc area c/o of 6/10 SSCP, obtained 12 ld EKG and 2 liters of O2 NP , pain subsided without further treatment. Pt had not taken any am meds today. Dr. Delana Meyer aware.

## 2015-11-17 ENCOUNTER — Encounter: Payer: Self-pay | Admitting: Vascular Surgery

## 2015-12-01 ENCOUNTER — Encounter: Payer: Self-pay | Admitting: Pain Medicine

## 2015-12-01 ENCOUNTER — Ambulatory Visit: Payer: Medicare HMO | Attending: Pain Medicine | Admitting: Pain Medicine

## 2015-12-01 VITALS — BP 134/64 | HR 67 | Temp 98.6°F | Resp 16 | Ht 66.5 in | Wt 241.4 lb

## 2015-12-01 DIAGNOSIS — M533 Sacrococcygeal disorders, not elsewhere classified: Secondary | ICD-10-CM

## 2015-12-01 DIAGNOSIS — M19012 Primary osteoarthritis, left shoulder: Secondary | ICD-10-CM | POA: Insufficient documentation

## 2015-12-01 DIAGNOSIS — M461 Sacroiliitis, not elsewhere classified: Secondary | ICD-10-CM

## 2015-12-01 DIAGNOSIS — M5116 Intervertebral disc disorders with radiculopathy, lumbar region: Secondary | ICD-10-CM | POA: Insufficient documentation

## 2015-12-01 DIAGNOSIS — M150 Primary generalized (osteo)arthritis: Secondary | ICD-10-CM

## 2015-12-01 DIAGNOSIS — M5126 Other intervertebral disc displacement, lumbar region: Secondary | ICD-10-CM | POA: Diagnosis not present

## 2015-12-01 DIAGNOSIS — Z96653 Presence of artificial knee joint, bilateral: Secondary | ICD-10-CM

## 2015-12-01 DIAGNOSIS — M19011 Primary osteoarthritis, right shoulder: Secondary | ICD-10-CM | POA: Diagnosis not present

## 2015-12-01 DIAGNOSIS — M1610 Unilateral primary osteoarthritis, unspecified hip: Secondary | ICD-10-CM | POA: Diagnosis not present

## 2015-12-01 DIAGNOSIS — M172 Bilateral post-traumatic osteoarthritis of knee: Secondary | ICD-10-CM

## 2015-12-01 DIAGNOSIS — M159 Polyosteoarthritis, unspecified: Secondary | ICD-10-CM

## 2015-12-01 DIAGNOSIS — Z992 Dependence on renal dialysis: Secondary | ICD-10-CM | POA: Insufficient documentation

## 2015-12-01 DIAGNOSIS — M160 Bilateral primary osteoarthritis of hip: Secondary | ICD-10-CM

## 2015-12-01 DIAGNOSIS — M25511 Pain in right shoulder: Secondary | ICD-10-CM | POA: Diagnosis present

## 2015-12-01 DIAGNOSIS — M5416 Radiculopathy, lumbar region: Secondary | ICD-10-CM

## 2015-12-01 DIAGNOSIS — M4690 Unspecified inflammatory spondylopathy, site unspecified: Secondary | ICD-10-CM | POA: Insufficient documentation

## 2015-12-01 DIAGNOSIS — M25512 Pain in left shoulder: Secondary | ICD-10-CM | POA: Diagnosis present

## 2015-12-01 DIAGNOSIS — M7060 Trochanteric bursitis, unspecified hip: Secondary | ICD-10-CM

## 2015-12-01 DIAGNOSIS — M170 Bilateral primary osteoarthritis of knee: Secondary | ICD-10-CM

## 2015-12-01 DIAGNOSIS — M47817 Spondylosis without myelopathy or radiculopathy, lumbosacral region: Secondary | ICD-10-CM

## 2015-12-01 DIAGNOSIS — M5137 Other intervertebral disc degeneration, lumbosacral region: Secondary | ICD-10-CM

## 2015-12-01 MED ORDER — OXYCODONE HCL 10 MG PO TABS: ORAL_TABLET | ORAL | Status: DC

## 2015-12-01 NOTE — Progress Notes (Signed)
Safety precautions to be maintained throughout the outpatient stay will include: orient to surroundings, keep bed in low position, maintain call bell within reach at all times, provide assistance with transfer out of bed and ambulation.  

## 2015-12-01 NOTE — Progress Notes (Signed)
Subjective:    Patient ID: Kimberly Shaffer, female    DOB: 10/14/1950, 65 y.o.   MRN: [id number]  HPI  The patient is a 65 year old female who returns to pain management for further evaluation and treatment of pain involving the shoulders and upper mid lower back and lower extremity region. The patient is presently undergoing hemodialysis and appears to be tolerating hemodialysis well. The patient states that she has pain involving the lower back and lower extremity region especially the left lower extremity region with sharp shooting pains of the left lower extremity with significant with ability to perform activities of daily living as well as ability to obtain restful sleep. We informed patient that we would prefer to avoid interventional treatment at this time. The patient stated that the pain was so significant that she wishes to proceed with interventional treatment as soon as possible. Decision was made to schedule patient for lumbosacral selective nerve root block to be performed at time of return appointment. The patient will continue oxycodone as prescribed. The patient will be considered for further evaluation including surgical evaluation as discussed. The patient was in agreement with suggested treatment plan. Sedated him to cause significant change in symptomatology. We will continue oxycodone as prescribed at this time and patient will be scheduled for lumbosacral selective nerve root block at time of return appointment in attempt to decrease severity of patient's symptoms, minimize progression of patient's symptoms, and avoid the need for more involved treatment. The patient agreed to suggested treatment plan.       Review of Systems     Objective:   Physical Exam  Palpation of the splenius capitis and occipitalis musculature regions was associated with mild discomfort. Patient was attends to palpation of the cervical facet cervical paraspinal musculature region a mild degree.  Palpation of the acromial clavicular and glenohumeral joint region was with moderate tenderness to palpation with limited range of motion of the shoulders noted on the left as well as on the right. The patient had difficulty performing drop test. Tinel and Phalen's maneuver were without increased pain of significant degree and patient appeared to be with only slightly decreased grip strength. Patient was with history of the left upper extremity. There was tends to palpation over the thoracic facet thoracic paraspinal musculature region with no crepitus of the thoracic region noted. There was moderate muscle spasms involving the lower thoracic paraspinal musculature region with no crepitus of the thoracic region noted. Palpation over the lumbar paraspinal muscular treat and lumbar facet region was attends to palpation of moderate degree with moderate tends to palpation to moderately severe tenderness to palpation over the left compared to the right. Palpation over the PSIS and PII S regions involved is moderate tends to palpation. Straight leg raising was decreased and tolerates approximately 20 with no definite increase of pain with dorsiflexion noted. Patient is status post total knee replacement. EHL strength appeared to be decreased. There was negative clonus negative Homans. Patient's pain was reproduced severe degree with straight leg raising and dorsiflexion on reevaluation. Abdomen was nontender and no costovertebral tenderness was noted.      Assessment & Plan:     Degenerative joint disease of shoulders  Degenerative joint disease of hip  Degenerative disc disease lumbar spine   L1 to, L2-3, L3-4, L4-5, and L5-S1 with multilevel degenerative disc disease, disc bulging, broad-based disc bulging, left paracentral disc protrusion, superior margination of disc material, bilateral facet arthropathy, L1-2 and L2-3 and L3-4 with bilateral  facet arthropathy as well as L4-5 and L5-S1.   Lumbar  radiculopathy  Lumbar facet syndrome      PLAN   Continue present medication oxycodone  Lumbosacral selective nerve root blocks to be performed at time of return appointment  F/U PCP Dr. Julieanne Cotton  for evaliation of  BP and general medical  condition  F/U surgical evaluation as discussed. Patient will follow-up with ENT as discussed area May consider additional surgical evaluations  F/U neurological evaluation. May consider  F/U with vascular surgery as needed. 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 Holley Raring  as planned  F/U Drs. [place] as discussed  May consider radiofrequency rhizolysis or intraspinal procedures pending response to present treatment and F/U evaluation . We will avoid such procedures at this time  Patient to call [facility] Center should patient have concerns prior to scheduled return appointment.

## 2015-12-01 NOTE — Patient Instructions (Addendum)
PLAN   Continue present medication oxycodone  Lumbosacral selective nerve root blocks to be performed at time of return appointment  F/U PCP Dr. Julieanne Cotton  for evaliation of  BP and general medical  condition  F/U surgical evaluation as discussed. Patient will follow-up with ENT as discussed area May consider additional surgical evaluations  F/U neurological evaluation. May consider  F/U with vascular surgery as needed. 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 Holley Raring  as planned  F/U Drs. [place] as discussed  May consider radiofrequency rhizolysis or intraspinal procedures pending response to present treatment and F/U evaluation . We will avoid such procedures at this time  Patient to call [facility] should patient have concerns prior to scheduled return appointment.Selective Nerve Root Block Patient Information  Description: Specific nerve roots exit the spinal canal and these nerves can be compressed and inflamed by a bulging disc and bone spurs.  By injecting steroids on the nerve root, we can potentially decrease the inflammation surrounding these nerves, which often leads to decreased pain.  Also, by injecting local anesthesia on the nerve root, this can provide [place] helpful information to give to your referring doctor if it decreases your pain.  Selective nerve root blocks can be done along the spine from the neck to the low back depending on the location of your pain.   After numbing the skin with local anesthesia, a small needle is passed to the nerve root and the position of the needle is verified using x-ray pictures.  After the needle is in correct position, we then deposit the medication.  You may experience a pressure sensation while this is being done.  The entire block usually lasts less than 15 minutes.  Conditions that may be treated with selective nerve root blocks:  Low back and leg  pain  Spinal stenosis  Diagnostic block prior to potential surgery  Neck and arm pain  Post laminectomy syndrome  Preparation for the injection:  1. Do not eat any solid food or dairy products within 8 hours of your appointment. 2. You may drink clear liquids up to 3 hours before an appointment.  Clear liquids include water, black coffee, juice or soda.  No milk or cream please. 3. You may take your regular medications, including pain medications, with a sip of water before your appointment.  Diabetics should hold regular insulin (if taken separately) and take 1/2 normal NPH dose the morning of the procedure.  Carry some sugar containing items with you to your appointment. 4. A driver must accompany you and be prepared to drive you home after your procedure. 5. Bring all your current medications with you. 6. An IV may be inserted and sedation may be given at the discretion of the physician. 7. A blood pressure cuff, EKG, and other monitors will often be applied during the procedure.  Some patients may need to have extra oxygen administered for a short period. 8. You will be asked to provide medical information, including allergies, prior to the procedure.  We must know immediately if you are taking blood  Thinners (like Coumadin) or if you are allergic to IV iodine contrast (dye).  Possible side-effects: All are usually temporary  Bleeding from needle site  Light headedness  Numbness and tingling  Decreased blood pressure  Weakness in arms/legs  Pressure sensation in back/neck  Pain at injection site (several days)  Possible complications: All are extremely rare  Infection  Nerve injury  Spinal headache (a headache wore with upright position)  Call if you experience:  Fever/chills associated with headache or increased back/neck pain  Headache worsened by an upright position  New onset weakness or numbness of an extremity below the injection site  Hives or  difficulty breathing (go to the emergency room)  Inflammation or drainage at the injection site(s)  Severe back/neck pain greater than usual  New symptoms which are concerning to you  Please note:  Although the local anesthetic injected can often make your back or neck feel good for several hours after the injection the pain will likely return.  It takes 3-5 days for steroids to work on the nerve root. You may not notice any pain relief for at least one week.  If effective, we will often do a series of 3 injections spaced 3-6 weeks apart to maximally decrease your pain.    If you have any questions, please call [phone] [facility] R[facility] AND COMPLICATIONS  What are the risk, side effects and possible complications? Generally speaking, most procedures are safe.  However, with any procedure there are risks, side effects, and the possibility of complications.  The risks and complications are dependent upon the sites that are lesioned, or the type of nerve block to be performed.  The closer the procedure is to the spine, the more serious the risks are.  Great care is taken when placing the radio frequency needles, block needles or lesioning probes, but sometimes complications can occur. 1. Infection: Any time there is an injection through the skin, there is a risk of infection.  This is why sterile conditions are used for these blocks.  There are four possible types of infection. 1. Localized skin infection. 2. Central Nervous System Infection-This can be in the form of Meningitis, which can be deadly. 3. Epidural Infections-This can be in the form of an epidural abscess, which can cause pressure inside of the spine, causing compression of the spinal cord with subsequent paralysis. This would require an emergency surgery to decompress, and there are no guarantees that the patient would recover from the paralysis. 4. Discitis-This is an infection of the  intervertebral discs.  It occurs in about 1% of discography procedures.  It is difficult to treat and it may lead to surgery.        2. Pain: the needles have to go through skin and soft tissues, will cause soreness.       3. Damage to internal structures:  The nerves to be lesioned may be near blood vessels or    other nerves which can be potentially damaged.       4. Bleeding: Bleeding is more common if the patient is taking blood thinners such as  aspirin, Coumadin, Ticiid, Plavix, etc., or if he/she have some genetic predisposition  such as hemophilia. Bleeding into the spinal canal can cause compression of the spinal  cord with subsequent paralysis.  This would require an emergency surgery to  decompress and there are no guarantees that the patient would recover from the  paralysis.       5. Pneumothorax:  Puncturing of a lung is a possibility, every time a needle is introduced in  the area of the chest or upper back.  Pneumothorax refers to free air around the  collapsed lung(s), inside of the thoracic cavity (chest cavity).  Another two possible  complications related to a similar event would include: Hemothorax and Chylothorax.  These are variations of the Pneumothorax, where instead of air around the collapsed  lung(s), you may have blood or chyle, respectively.       6. Spinal headaches: They may occur with any procedures in the area of the spine.       7. Persistent CSF (Cerebro-Spinal Fluid) leakage: This is a rare problem, but may occur  with prolonged intrathecal or epidural catheters either due to the formation of a fistulous  track or a dural tear.       8. Nerve damage: By working so close to the spinal cord, there is always a possibility of  nerve damage, which could be as serious as a permanent spinal cord injury with  paralysis.       9. Death:  Although rare, severe deadly allergic reactions known as "Anaphylactic  reaction" can occur to any of the medications used.       10. Worsening of the symptoms:  We can always make thing worse.  What are the chances of something like this happening? Chances of any of this occuring are extremely low.  By statistics, you have more of a chance of getting killed in a motor vehicle accident: while driving to the hospital than any of the above occurring .  Nevertheless, you should be aware that they are possibilities.  In general, it is similar to taking a shower.  Everybody knows that you can slip, hit your head and get killed.  Does that mean that you should not shower again?  Nevertheless always keep in mind that statistics do not mean anything if you happen to be on the wrong side of them.  Even if a procedure has a 1 (one) in a 1,000,000 (million) chance of going wrong, it you happen to be that one..Also, keep in mind that by statistics, you have more of a chance of having something go wrong when taking medications.  Who should not have this procedure? If you are on a blood thinning medication (e.g. Coumadin, Plavix, see list of "Blood Thinners"), or if you have an active infection going on, you should not have the procedure.  If you are taking any blood thinners, please inform your physician.  How should I prepare for this procedure?  Do not eat or drink anything at least six hours prior to the procedure.  Bring a driver with you .  It cannot be a taxi.  Come accompanied by an adult that can drive you back, and that is strong enough to help you if your legs get weak or numb from the local anesthetic.  Take all of your medicines the morning of the procedure with just enough water to swallow them.  If you have diabetes, make sure that you are scheduled to have your procedure done first thing in the morning, whenever possible.  If you have diabetes, take only half of your insulin dose and notify our nurse that you have done so as soon as you arrive at the clinic.  If you are diabetic, but only take blood sugar pills (oral  hypoglycemic), then do not take them on the morning of your procedure.  You may take them after you have had the procedure.  Do not take aspirin or any aspirin-containing medications, at least eleven (11) days prior to the procedure.  They may prolong bleeding.  Wear loose fitting clothing that may be easy to take off and that you would not mind if it got stained with Betadine or blood.  Do not wear any jewelry or perfume  Remove any nail coloring.  It will interfere with some of our monitoring equipment.  NOTE: Remember that this is not meant to be interpreted as a complete list of all possible complications.  Unforeseen problems may occur.  BLOOD THINNERS The following drugs contain aspirin or other products, which can cause increased bleeding during surgery and should not be taken for 2 weeks prior to and 1 week after surgery.  If you should need take something for relief of minor pain, you may take acetaminophen which is found in Tylenol,m Datril, Anacin-3 and Panadol. It is not blood thinner. The products listed below are.  Do not take any of the products listed below in addition to any listed on your instruction sheet.  A.P.C or A.P.C with Codeine Codeine Phosphate Capsules #3 Ibuprofen Ridaura  ABC compound Congesprin Imuran rimadil  Advil Cope Indocin Robaxisal  Alka-Seltzer Effervescent Pain Reliever and Antacid Coricidin or Coricidin-D  Indomethacin Rufen  Alka-Seltzer plus Cold Medicine Cosprin Ketoprofen S-A-C Tablets  Anacin Analgesic Tablets or Capsules Coumadin Korlgesic Salflex  Anacin Extra Strength Analgesic tablets or capsules CP-2 Tablets Lanoril Salicylate  Anaprox Cuprimine Capsules Levenox Salocol  Anexsia-D Dalteparin Magan Salsalate  Anodynos Darvon compound Magnesium Salicylate Sine-off  Ansaid Dasin Capsules Magsal Sodium Salicylate  Anturane Depen Capsules Marnal Soma  APF Arthritis pain formula Dewitt's Pills Measurin Stanback  Argesic Dia-Gesic Meclofenamic  Sulfinpyrazone  Arthritis Bayer Timed Release Aspirin Diclofenac Meclomen Sulindac  Arthritis pain formula Anacin Dicumarol Medipren Supac  Analgesic (Safety coated) Arthralgen Diffunasal Mefanamic Suprofen  Arthritis Strength Bufferin Dihydrocodeine Mepro Compound Suprol  Arthropan liquid Dopirydamole Methcarbomol with Aspirin Synalgos  ASA tablets/Enseals Disalcid Micrainin Tagament  Ascriptin Doan's Midol Talwin  Ascriptin A/D Dolene Mobidin Tanderil  Ascriptin Extra Strength Dolobid Moblgesic Ticlid  Ascriptin with Codeine Doloprin or Doloprin with Codeine Momentum Tolectin  Asperbuf Duoprin Mono-gesic Trendar  Aspergum Duradyne Motrin or Motrin IB Triminicin  Aspirin plain, buffered or enteric coated Durasal Myochrisine Trigesic  Aspirin Suppositories Easprin Nalfon Trillsate  Aspirin with Codeine Ecotrin Regular or Extra Strength Naprosyn Uracel  Atromid-S Efficin Naproxen Ursinus  Auranofin Capsules Elmiron Neocylate Vanquish  Axotal Emagrin Norgesic Verin  Azathioprine Empirin or Empirin with Codeine Normiflo Vitamin E  Azolid Emprazil Nuprin Voltaren  Bayer Aspirin plain, buffered or children's or timed BC Tablets or powders Encaprin Orgaran Warfarin Sodium  Buff-a-Comp Enoxaparin Orudis Zorpin  Buff-a-Comp with Codeine Equegesic Os-Cal-Gesic   Buffaprin Excedrin plain, buffered or Extra Strength Oxalid   Bufferin Arthritis Strength Feldene Oxphenbutazone   Bufferin plain or Extra Strength Feldene Capsules Oxycodone with Aspirin   Bufferin with Codeine Fenoprofen Fenoprofen Pabalate or Pabalate-SF   Buffets II Flogesic Panagesic   Buffinol plain or Extra Strength Florinal or Florinal with Codeine Panwarfarin   Buf-Tabs Flurbiprofen Penicillamine   Butalbital Compound Four-way cold tablets Penicillin   Butazolidin Fragmin Pepto-Bismol   Carbenicillin Geminisyn Percodan   Carna Arthritis Reliever Geopen Persantine   Carprofen Gold's salt Persistin   Chloramphenicol Goody's  Phenylbutazone   Chloromycetin Haltrain Piroxlcam   Clmetidine heparin Plaquenil   Cllnoril Hyco-pap Ponstel   Clofibrate Hydroxy chloroquine Propoxyphen         Before stopping any of these medications, be sure to consult the physician who ordered them.  Some, such as Coumadin (Warfarin) are ordered to prevent or treat serious conditions such as "deep thrombosis", "pumonary embolisms", and other heart problems.  The amount of time that you may need off of the  medication may also vary with the medication and the reason for which you were taking it.  If you are taking any of these medications, please make sure you notify your pain physician before you undergo any procedures.

## 2015-12-04 ENCOUNTER — Encounter
Admission: RE | Disposition: A | Discharge status: Home / Self Care | Payer: Self-pay | Source: Ambulatory Visit | Attending: Vascular Surgery

## 2015-12-04 ENCOUNTER — Ambulatory Visit
Admission: RE | Admit: 2015-12-04 | Discharge: 2015-12-04 | Disposition: A | Discharge status: Home / Self Care | Payer: Medicare HMO | Source: Ambulatory Visit | Attending: Vascular Surgery | Admitting: Vascular Surgery

## 2015-12-04 DIAGNOSIS — F329 Major depressive disorder, single episode, unspecified: Secondary | ICD-10-CM | POA: Insufficient documentation

## 2015-12-04 DIAGNOSIS — E7800 Pure hypercholesterolemia, unspecified: Secondary | ICD-10-CM | POA: Diagnosis not present

## 2015-12-04 DIAGNOSIS — Z882 Allergy status to sulfonamides status: Secondary | ICD-10-CM | POA: Insufficient documentation

## 2015-12-04 DIAGNOSIS — E669 Obesity, unspecified: Secondary | ICD-10-CM | POA: Insufficient documentation

## 2015-12-04 DIAGNOSIS — F17210 Nicotine dependence, cigarettes, uncomplicated: Secondary | ICD-10-CM | POA: Insufficient documentation

## 2015-12-04 DIAGNOSIS — I2510 Atherosclerotic heart disease of native coronary artery without angina pectoris: Secondary | ICD-10-CM | POA: Diagnosis not present

## 2015-12-04 DIAGNOSIS — T8241XA Breakdown (mechanical) of vascular dialysis catheter, initial encounter: Secondary | ICD-10-CM | POA: Insufficient documentation

## 2015-12-04 DIAGNOSIS — I120 Hypertensive chronic kidney disease with stage 5 chronic kidney disease or end stage renal disease: Secondary | ICD-10-CM | POA: Insufficient documentation

## 2015-12-04 DIAGNOSIS — Y841 Kidney dialysis as the cause of abnormal reaction of the patient, or of later complication, without mention of misadventure at the time of the procedure: Secondary | ICD-10-CM | POA: Diagnosis not present

## 2015-12-04 DIAGNOSIS — E1122 Type 2 diabetes mellitus with diabetic chronic kidney disease: Secondary | ICD-10-CM | POA: Diagnosis not present

## 2015-12-04 DIAGNOSIS — Z9889 Other specified postprocedural states: Secondary | ICD-10-CM | POA: Diagnosis not present

## 2015-12-04 DIAGNOSIS — J449 Chronic obstructive pulmonary disease, unspecified: Secondary | ICD-10-CM | POA: Diagnosis not present

## 2015-12-04 DIAGNOSIS — Z8673 Personal history of transient ischemic attack (TIA), and cerebral infarction without residual deficits: Secondary | ICD-10-CM | POA: Insufficient documentation

## 2015-12-04 DIAGNOSIS — I252 Old myocardial infarction: Secondary | ICD-10-CM | POA: Insufficient documentation

## 2015-12-04 DIAGNOSIS — N186 End stage renal disease: Secondary | ICD-10-CM | POA: Insufficient documentation

## 2015-12-04 DIAGNOSIS — Z86718 Personal history of other venous thrombosis and embolism: Secondary | ICD-10-CM | POA: Diagnosis not present

## 2015-12-04 DIAGNOSIS — Z992 Dependence on renal dialysis: Secondary | ICD-10-CM | POA: Diagnosis not present

## 2015-12-04 HISTORY — PX: PERIPHERAL VASCULAR CATHETERIZATION: SHX172C

## 2015-12-04 LAB — POTASSIUM (ARMC VASCULAR LAB ONLY): POTASSIUM (ARMC VASCULAR LAB): 4.3 (ref 3.5–5.1)

## 2015-12-04 SURGERY — DIALYSIS/PERMA CATHETER INSERTION
Anesthesia: Moderate Sedation

## 2015-12-04 MED ORDER — DEXTROSE 5 % IV SOLN: 1.5000 g | Freq: Once | INTRAVENOUS | Status: DC

## 2015-12-04 MED ORDER — FENTANYL CITRATE (PF) 100 MCG/2ML IJ SOLN
INTRAMUSCULAR | Status: AC
  Filled 2015-12-04: qty 2

## 2015-12-04 MED ORDER — HEPARIN (PORCINE) IN NACL 2-0.9 UNIT/ML-% IJ SOLN
INTRAMUSCULAR | Status: AC
  Filled 2015-12-04: qty 500

## 2015-12-04 MED ORDER — SODIUM CHLORIDE 0.9 % IV SOLN
INTRAVENOUS | Status: DC
  Administered 2015-12-04: 15:00:00 via INTRAVENOUS

## 2015-12-04 MED ORDER — MIDAZOLAM HCL 2 MG/2ML IJ SOLN
INTRAMUSCULAR | Status: DC | PRN
  Administered 2015-12-04: 2 mg via INTRAVENOUS

## 2015-12-04 MED ORDER — MIDAZOLAM HCL 2 MG/2ML IJ SOLN
INTRAMUSCULAR | Status: AC
  Filled 2015-12-04: qty 4

## 2015-12-04 MED ORDER — FENTANYL CITRATE (PF) 100 MCG/2ML IJ SOLN
INTRAMUSCULAR | Status: DC | PRN
  Administered 2015-12-04: 50 ug via INTRAVENOUS

## 2015-12-04 MED ORDER — LIDOCAINE-EPINEPHRINE (PF) 1 %-1:200000 IJ SOLN
INTRAMUSCULAR | Status: AC
Start: 2015-12-04 — End: 2015-12-04
  Filled 2015-12-04: qty 30

## 2015-12-04 MED ORDER — HEPARIN SODIUM (PORCINE) 10000 UNIT/ML IJ SOLN
INTRAMUSCULAR | Status: AC
Start: 2015-12-04 — End: 2015-12-04
  Filled 2015-12-04: qty 1

## 2015-12-04 SURGICAL SUPPLY — 5 items
CATH PALINDROME-P 23CM W/VT (CATHETERS) ×3 IMPLANT
GLIDEWIRE STIFF .35X180X3 HYDR (WIRE) ×3 IMPLANT
PACK ANGIOGRAPHY (CUSTOM PROCEDURE TRAY) ×3 IMPLANT
TOWEL OR 17X26 4PK STRL BLUE (TOWEL DISPOSABLE) ×3 IMPLANT
WIRE AMPLATZ SS .035X180 (WIRE) ×3 IMPLANT

## 2015-12-04 NOTE — Discharge Instructions (Signed)
, Care After °Refer to this sheet in the next few weeks. These instructions provide you with information on caring for yourself after your procedure. Your caregiver may also give you more specific instructions. Your treatment has been planned according to current medical practices, but problems sometimes occur. Call your caregiver if you have any problems or questions after your procedure.  °HOME CARE INSTRUCTIONS °· Rest at home the day of the procedure. You will likely be able to return to normal activities the following day. °· Follow your caregiver's specific instructions for the type of device that you have. °· Only take over-the-counter or prescription medicines as directed by your caregiver. °· Keep the insertion site of the catheter clean and dry at all times. °¨ Change the bandages (dressings) over the catheter site as directed by your caregiver. °¨ Wash the area around the catheter site during each dressing change. Sponge bathe the area using a germ-killing (antiseptic) solution as directed by your caregiver. °¨ Look for redness or swelling at the insertion site during each dressing change. °· Apply an antibiotic ointment as directed by your caregiver. °· Flush your catheter as directed to keep it from becoming clogged. °· Always wash your hands thoroughly before changing dressings or flushing the catheter. °· Do not let air enter the catheter. °¨ Never open the cap at the catheter tip. °¨ Always make sure there is no air in the syringe or in the tubing for infusions.    °· Do not lift anything heavy. °· Do not drive until your caregiver approves. °· Do not shower or bathe until your caregiver approves. When you shower or bathe, place a piece of plastic wrap over the catheter site. Do not allow the catheter site or the dressing to get wet. If taking a bath, do not allow the catheter to get submerged in the water. °If the catheter was inserted through an arm vein:  °· Avoid wearing tight clothes or jewelry  on the arm that has the catheter.   °· Do not sleep with your head on the arm that has the catheter.   °· Do not allow use of a blood pressure cuff on the arm that has the catheter.   °· Do not let anyone draw blood from the arm that has the catheter, except through the catheter itself. °SEEK MEDICAL CARE IF: °· You have bleeding at the insertion site of the catheter.   °· You feel weak or nauseous.   °· Your catheter is not working properly.   °· You have redness, pain, swelling, and warmth at the insertion site.   °· You notice fluid draining from the insertion site.   °SEEK IMMEDIATE MEDICAL CARE IF: °· Your catheter breaks or has a hole in it.   °· Your catheter comes loose or gets pulled completely out. If this happens, hold firm pressure over the area with your hand or a clean cloth.   °· You have a fever. °· You have chills.   °· Your catheter becomes totally blocked.   °· You have swelling in your arm, shoulder, neck, or face.   °· You have bleeding from the insertion site that does not stop.   °· You develop chest pain or have trouble breathing.   °· You feel dizzy or faint.   °MAKE SURE YOU: °· Understand these instructions. °· Will watch your condition. °· Will get help right away if you are not doing well or get worse. °  °This information is not intended to replace advice given to you by your health care provider. Make sure you discuss any questions you   have with your health care provider. °  °Document Released: 08/22/2012 Document Revised: 05/08/2013 Document Reviewed: 08/22/2012 °[employer] Interactive Patient Education ©2016 [employer] ° °

## 2015-12-04 NOTE — H&P (Addendum)
[facility] Admission History & Physical  MRN : [id number]  Kimberly Shaffer is a 65 y.o. ([birth year]) female who presents with chief complaint of clotted dialysis access .  History of Present Illness: The patient is sent from the dialysis center we are asked to treat her by the nephrologist Dr. Holley Raring for a nonfunctioning catheter. She presented Wednesday they were not able to use the catheter at her routine dialysis.  She does have a left brachiocephalic fistula which has a thrill and bruit detected but is not yet matured adequately to provide access. There have not been any reported problems with her access prior to this. Patient denies fever or chills. She denies hand pain or arm pain. No drainage from the area of the AV access.  No current facility-administered medications for this encounter.    Past Medical History  Diagnosis Date  . Myocardial infarction ([place])   . COPD (chronic obstructive pulmonary disease) ([place])   . Hypertension   . Obesity   . Depression   . Hypercholesteremia   . H/O blood clots   . Stroke ([place])   . H/O angina pectoris   . CAD (coronary artery disease)   . Diabetes mellitus without complication (HCC)     NIDD  . Vertigo, aural     Past Surgical History  Procedure Laterality Date  . Cardiac catheterization    . Coronary stent placement    . Knee surgery Bilateral   . Replacement total knee bilateral Bilateral   . Coronary artery bypass graft  2012  . Joint replacement    . Av fistula placement Left 11-04-2015  . Av fistula placement Left 11/04/2015    Procedure: ARTERIOVENOUS (AV) FISTULA CREATION  ( BRACHIAL CEPHALIC );  Surgeon: Katha Cabal, MD;  Location: [facility];  Service: Vascular;  Laterality: Left;  . Peripheral vascular catheterization N/A 11/13/2015    Procedure: Dialysis/Perma Catheter Insertion;  Surgeon: Katha Cabal, MD;  Location: [facility];  Service: Cardiovascular;  Laterality: N/A;     Social History Social History  Substance Use Topics  . Smoking status: Current Every Day Smoker -- 0.50 packs/day    Types: Cigarettes  . Smokeless tobacco: Not on file  . Alcohol Use: No    Family History Family History  Problem Relation Age of Onset  . Cancer Mother   . Cancer Father   . Diabetes Brother   . Heart disease Brother   No history of bleeding or clotting disorders, porphyria, autoimmune disease  Allergies  Allergen Reactions  . Nystatin Hives and Itching  . Sulfa Antibiotics Swelling and Hives     REVIEW OF SYSTEMS (Negative unless checked)  Constitutional: [] Weight loss  [] Fever  [] Chills Cardiac: [] Chest pain   [] Chest pressure   [] Palpitations   [] Shortness of breath when laying flat   [] Shortness of breath at rest   [] Shortness of breath with exertion. Vascular:  [] Pain in legs with walking   [] Pain in legs at rest   [] Pain in legs when laying flat   [] Claudication   [] Pain in feet when walking  [] Pain in feet at rest  [] Pain in feet when laying flat   [] History of DVT   [] Phlebitis   [] Swelling in legs   [] Varicose veins   [] Non-healing ulcers Pulmonary:   [] Uses home oxygen   [] Productive cough   [] Hemoptysis   [] Wheeze  [] COPD   [] Asthma Neurologic:  [] Dizziness  [] Blackouts   [] Seizures   [] History  of stroke   [] History of TIA  [] Aphasia   [] Temporary blindness   [] Dysphagia   [] Weakness or numbness in arms   [] Weakness or numbness in legs Musculoskeletal:  [] Arthritis   [] Joint swelling   [] Joint pain   [] Low back pain Hematologic:  [] Easy bruising  [] Easy bleeding   [] Hypercoagulable state   [] Anemic  [] Hepatitis Gastrointestinal:  [] Blood in stool   [] Vomiting blood  [] Gastroesophageal reflux/heartburn   [] Difficulty swallowing. Genitourinary:  [] Chronic kidney disease   [] Difficult urination  [] Frequent urination  [] Burning with urination   [] Blood in urine Skin:  [] Rashes   [] Ulcers   [] Wounds Psychological:  [] History of anxiety   []  History of  major depression.  Physical Examination  There were no vitals filed for this visit. There is no weight on file to calculate BMI. Gen: WD/WN, NAD Head: [place]/AT, No temporalis wasting. Prominent temp pulse not noted. Ear/Nose/Throat: Hearing grossly intact, nares w/o erythema or drainage, oropharynx w/o Erythema/Exudate,  Eyes: PERRLA, EOMI.  Neck: Supple, no nuchal rigidity.  No bruit or JVD.  Pulmonary:  Good air movement, clear to auscultation bilaterally, no use of accessory muscles.  Cardiac: RRR, normal S1, S2, no Murmurs, rubs or gallops. Vascular: Left arm AV access good thrill good bruit; tunneled catheter right IJ Vessel Right Left  Radial Palpable Palpable  Ulnar Palpable Palpable  Brachial Palpable Palpable  Carotid Palpable, without bruit Palpable, without bruit   Gastrointestinal: soft, non-tender/non-distended. No guarding/reflex.  Musculoskeletal: M/S 5/5 throughout.  Extremities without ischemic changes.  No deformity or atrophy.  Neurologic: CN 2-12 intact. Pain and light touch intact in extremities.  Symmetrical.  Speech is fluent. Motor exam as listed above. Psychiatric: Judgment intact, Mood & affect appropriate for pt's clinical situation. Dermatologic: No rashes or ulcers noted.  No cellulitis or open wounds. Lymph : No Cervical, Axillary, or Inguinal lymphadenopathy.   CBC Lab Results  Component Value Date   WBC 8.6 10/29/2015   HGB 9.9* 10/29/2015   HCT 30.6* 10/29/2015   MCV 87.0 10/29/2015   PLT 169 10/29/2015    BMET    Component Value Date/Time   NA 138 10/29/2015 1609   NA 140 04/09/2014 0404   K 4.3 10/29/2015 1609   K 4.5 04/09/2014 0404   CL 113* 10/29/2015 1609   CL 113* 04/09/2014 0404   CO2 19* 10/29/2015 1609   CO2 20* 04/09/2014 0404   GLUCOSE 93 10/29/2015 1609   GLUCOSE 86 04/09/2014 0404   BUN 73* 10/29/2015 1609   BUN 36* 04/09/2014 0404   CREATININE 6.36* 10/29/2015 1609   CREATININE 3.34* 04/09/2014 0404   CALCIUM 8.9  10/29/2015 1609   CALCIUM 8.9 04/09/2014 0404   GFRNONAA 6* 10/29/2015 1609   GFRNONAA 14* 04/09/2014 0404   GFRAA 7* 10/29/2015 1609   GFRAA 16* 04/09/2014 0404   CrCl cannot be calculated (Patient has no serum creatinine result on file.).  COAG Lab Results  Component Value Date   INR 1.13 10/29/2015   INR 0.9 06/10/2013    Radiology No results found.  Assessment/Plan 1.  Complication dialysis device with thrombosis AV access:  Patient's right IJ tunneled dialysis catheter is thrombosed. The patient will undergo revision and/or exchange. Potassium will be drawn to ensure that it is an appropriate level prior to performing thrombectomy. 2.  End-stage renal disease requiring hemodialysis:  Patient will continue dialysis therapy without further interruption. Dialysis has already been arranged since the patient missed their previous session 3.  Hypertension:  Patient  will continue medical management; nephrology is following no changes in oral medications. 4. Diabetes mellitus:  Glucose will be monitored and oral medications been held this morning once the patient has undergone the patient's procedure po intake will be reinitiated and again Accu-Cheks will be used to assess the blood glucose level and treat as needed. The patient will be restarted on the patient's usual hypoglycemic regime 5.  Coronary artery disease:  EKG will be monitored. Nitrates will be used if needed. The patient's oral cardiac medications will be continued.     Schnier, Dolores Lory, MD  12/04/2015 2:32 PM

## 2015-12-04 NOTE — Op Note (Signed)
OPERATIVE NOTE   PROCEDURE: 1. Insertion of tunneled dialysis catheter right IJ approach same venous access.  PRE-OPERATIVE DIAGNOSIS: Complication of dialysis access with nonfunction of the tunneled catheter; end-stage renal disease requiring hemodialysis  POST-OPERATIVE DIAGNOSIS: Same  SURGEON: Imya Mance, Dolores Lory  ANESTHESIA: Conscious sedation was administered under my direct supervision. IV Versed plus fentanyl were utilized. Continuous ECG, pulse oximetry and blood pressure was monitored throughout the entire procedure. A total of 3 milligrams of Versed and 100 micrograms of fentanyl were utilized.  Conscious sedation was for a total of 25 minutes.  ESTIMATED BLOOD LOSS: Minimal cc  CONTRAST USED:  None  FLUOROSCOPY TIME:  0.9  minutes  INDICATIONS:   Kimberly Shaffer is a 64 y.o.y.o. female who presents with poor flow and nonfunction of the tunneled dialysis catheter.  Adequate dialysis has not been possible.  DESCRIPTION: After obtaining full informed written consent, the patient was positioned supine. The right neck and chest wall was prepped and draped in a sterile fashion. The cuff is localized and using blunt and sharp dissection it is freed from the surrounding adhesions.  The existing catheter is then transected proximal to the cuff.  The guidewire is advanced without difficulty under fluoroscopy.  Dilators are passed over the wire as needed and the tunneled dialysis catheter is fed into the central venous system without difficulty.  Under fluoroscopy the catheter tip positioned at the atrial caval junction.  Both lumens aspirate and flush easily. After verification of smooth contour with proper tip position under fluoroscopy the catheter is packed with 5000 units of heparin per lumen.  Catheter secured to the skin of the right chest wall with 0 silk. A sterile dressing is applied with a Biopatch.  COMPLICATIONS: None  CONDITION: Kimberly Shaffer, [facility] Office:  [phone]   12/04/2015,4:56 PM

## 2015-12-07 ENCOUNTER — Encounter: Payer: Self-pay | Admitting: Vascular Surgery

## 2015-12-14 ENCOUNTER — Emergency Department: Payer: Medicare HMO

## 2015-12-14 ENCOUNTER — Emergency Department
Admission: EM | Admit: 2015-12-14 | Discharge: 2015-12-15 | Disposition: A | Discharge status: Home / Self Care | Payer: Medicare HMO | Attending: Emergency Medicine | Admitting: Emergency Medicine

## 2015-12-14 DIAGNOSIS — J449 Chronic obstructive pulmonary disease, unspecified: Secondary | ICD-10-CM | POA: Insufficient documentation

## 2015-12-14 DIAGNOSIS — J45909 Unspecified asthma, uncomplicated: Secondary | ICD-10-CM | POA: Insufficient documentation

## 2015-12-14 DIAGNOSIS — I10 Essential (primary) hypertension: Secondary | ICD-10-CM | POA: Insufficient documentation

## 2015-12-14 DIAGNOSIS — Z7982 Long term (current) use of aspirin: Secondary | ICD-10-CM | POA: Insufficient documentation

## 2015-12-14 DIAGNOSIS — Z86718 Personal history of other venous thrombosis and embolism: Secondary | ICD-10-CM | POA: Insufficient documentation

## 2015-12-14 DIAGNOSIS — I639 Cerebral infarction, unspecified: Secondary | ICD-10-CM | POA: Insufficient documentation

## 2015-12-14 DIAGNOSIS — E669 Obesity, unspecified: Secondary | ICD-10-CM | POA: Insufficient documentation

## 2015-12-14 DIAGNOSIS — Z955 Presence of coronary angioplasty implant and graft: Secondary | ICD-10-CM | POA: Insufficient documentation

## 2015-12-14 DIAGNOSIS — E7800 Pure hypercholesterolemia, unspecified: Secondary | ICD-10-CM | POA: Diagnosis not present

## 2015-12-14 DIAGNOSIS — I252 Old myocardial infarction: Secondary | ICD-10-CM | POA: Diagnosis not present

## 2015-12-14 DIAGNOSIS — Z79899 Other long term (current) drug therapy: Secondary | ICD-10-CM | POA: Insufficient documentation

## 2015-12-14 DIAGNOSIS — Z96653 Presence of artificial knee joint, bilateral: Secondary | ICD-10-CM | POA: Insufficient documentation

## 2015-12-14 DIAGNOSIS — E119 Type 2 diabetes mellitus without complications: Secondary | ICD-10-CM | POA: Insufficient documentation

## 2015-12-14 DIAGNOSIS — F329 Major depressive disorder, single episode, unspecified: Secondary | ICD-10-CM | POA: Diagnosis not present

## 2015-12-14 DIAGNOSIS — J209 Acute bronchitis, unspecified: Secondary | ICD-10-CM

## 2015-12-14 DIAGNOSIS — R509 Fever, unspecified: Secondary | ICD-10-CM | POA: Diagnosis present

## 2015-12-14 DIAGNOSIS — Z992 Dependence on renal dialysis: Secondary | ICD-10-CM | POA: Diagnosis not present

## 2015-12-14 DIAGNOSIS — I2510 Atherosclerotic heart disease of native coronary artery without angina pectoris: Secondary | ICD-10-CM | POA: Diagnosis not present

## 2015-12-14 DIAGNOSIS — F17210 Nicotine dependence, cigarettes, uncomplicated: Secondary | ICD-10-CM | POA: Insufficient documentation

## 2015-12-14 LAB — CBC WITH DIFFERENTIAL/PLATELET
BASOS ABS: 0 10*3/uL (ref 0–0.1)
BASOS PCT: 1 %
EOS ABS: 0.1 10*3/uL (ref 0–0.7)
Eosinophils Relative: 1 %
HCT: 25.1 % — ABNORMAL LOW (ref 35.0–47.0)
HEMOGLOBIN: 8.1 g/dL — AB (ref 12.0–16.0)
LYMPHS ABS: 0.6 10*3/uL — AB (ref 1.0–3.6)
Lymphocytes Relative: 10 %
MCH: 28.1 pg (ref 26.0–34.0)
MCHC: 32.1 g/dL (ref 32.0–36.0)
MCV: 87.4 fL (ref 80.0–100.0)
Monocytes Absolute: 0.9 10*3/uL (ref 0.2–0.9)
Monocytes Relative: 14 %
NEUTROS PCT: 74 %
Neutro Abs: 4.7 10*3/uL (ref 1.4–6.5)
Platelets: 191 10*3/uL (ref 150–440)
RBC: 2.88 MIL/uL — AB (ref 3.80–5.20)
RDW: 16.3 % — ABNORMAL HIGH (ref 11.5–14.5)
WBC: 6.4 10*3/uL (ref 3.6–11.0)

## 2015-12-14 LAB — COMPREHENSIVE METABOLIC PANEL
ALBUMIN: 3.4 g/dL — AB (ref 3.5–5.0)
ALK PHOS: 80 U/L (ref 38–126)
ALT: 12 U/L — AB (ref 14–54)
AST: 22 U/L (ref 15–41)
Anion gap: 10 (ref 5–15)
BUN: 13 mg/dL (ref 6–20)
CALCIUM: 8.3 mg/dL — AB (ref 8.9–10.3)
CO2: 29 mmol/L (ref 22–32)
CREATININE: 2.63 mg/dL — AB (ref 0.44–1.00)
Chloride: 96 mmol/L — ABNORMAL LOW (ref 101–111)
GFR calc Af Amer: 21 mL/min — ABNORMAL LOW (ref >60)
GFR calc non Af Amer: 18 mL/min — ABNORMAL LOW (ref >60)
GLUCOSE: 134 mg/dL — AB (ref 65–99)
Potassium: 2.8 mmol/L — CL (ref 3.5–5.1)
SODIUM: 135 mmol/L (ref 135–145)
Total Bilirubin: 0.4 mg/dL (ref 0.3–1.2)
Total Protein: 7.3 g/dL (ref 6.5–8.1)

## 2015-12-14 LAB — TROPONIN I: Troponin I: <0.03 ng/mL (ref <0.031)

## 2015-12-14 MED ORDER — ACETAMINOPHEN 325 MG PO TABS
650.0000 mg | ORAL_TABLET | Freq: Once | ORAL | Status: AC
  Administered 2015-12-14: 650 mg via ORAL
  Filled 2015-12-14: qty 2

## 2015-12-14 NOTE — ED Notes (Signed)
Patient forgot to mention that she used her spray nitro three times PTA

## 2015-12-14 NOTE — ED Notes (Signed)
Patient unable to void at this time. Patient states she accidentally threw the urine sample away.

## 2015-12-14 NOTE — ED Notes (Addendum)
Pt here for fever and chills, also co shob at this time.  No shob noted at this time has had productive cough.  Pt brought here from dyalisis workup of fever, did finish treatment.

## 2015-12-14 NOTE — ED Notes (Signed)
Resumed care from sonja rn   Pt alert.  Pt reports bodyaches and chills.  Pt drinking water.

## 2015-12-14 NOTE — ED Provider Notes (Signed)
[facility] Emergency Department Provider Note  ____________________________________________  Time seen: 10:45 PM  I have reviewed the triage vital signs and the nursing notes.   HISTORY  Chief Complaint Fever    HPI Kimberly Shaffer is a 65 y.o. female with history of COPD myocardial infarction DVTs CAD diabetes end-stage renal disease status post dialysis today presents with fever chills shortness breath and productive cough times one day. Patient's temperature on presentation to emergency department 100.3 with a respiratory rate of 22. Patient states that she is noted increasing cough since yesterday and subjective fever.     Past Medical History  Diagnosis Date  . Myocardial infarction ([place])   . COPD (chronic obstructive pulmonary disease) ([place])   . Hypertension   . Obesity   . Depression   . Hypercholesteremia   . H/O blood clots   . Stroke ([place])   . H/O angina pectoris   . CAD (coronary artery disease)   . Diabetes mellitus without complication (HCC)     NIDD  . Vertigo, aural     Patient Active Problem List   Diagnosis Date Noted  . DJD of shoulder 10/21/2015  . 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 Lumbar radiculopathy 05/14/2015  . 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 Lumbosacral facet joint syndrome 02/16/2015  . Greater trochanteric bursitis 02/16/2015  . DDD (degenerative disc disease), lumbosacral 01/20/2015  . 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 DJD (degenerative joint disease) shoulder 01/20/2015    Past Surgical History  Procedure Laterality Date  . Cardiac catheterization    . Coronary stent placement    . Knee surgery Bilateral   . Replacement total knee bilateral Bilateral   . Coronary artery bypass graft  2012  . Joint replacement    . Av fistula placement Left 11-04-2015  . Av fistula placement Left 11/04/2015    Procedure: ARTERIOVENOUS (AV) FISTULA CREATION  (  BRACHIAL CEPHALIC );  Surgeon: Katha Cabal, MD;  Location: [facility];  Service: Vascular;  Laterality: Left;  . Peripheral vascular catheterization N/A 11/13/2015    Procedure: Dialysis/Perma Catheter Insertion;  Surgeon: Katha Cabal, MD;  Location: [place] [facility];  Service: Cardiovascular;  Laterality: N/A;  . Peripheral vascular catheterization N/A 12/04/2015    Procedure: Dialysis/Perma Catheter Insertion;  Surgeon: Katha Cabal, MD;  Location: [facility];  Service: Cardiovascular;  Laterality: N/A;    Current Outpatient Rx  Name  Route  Sig  Dispense  Refill  . albuterol (PROVENTIL HFA;VENTOLIN HFA) 108 (90 BASE) MCG/ACT inhaler   Inhalation   Inhale into the lungs every 4 (four) hours as needed for wheezing or shortness of breath. 2-4 puffs         . amLODipine (NORVASC) 10 MG tablet   Oral   Take 10 mg by mouth daily.         Marland Kitchen aspirin 81 MG tablet   Oral   Take 81 mg by mouth daily.         Marland Kitchen atorvastatin (LIPITOR) 80 MG tablet   Oral   Take 80 mg by mouth daily.         . calcium carbonate (OSCAL) 1500 (600 Ca) MG TABS tablet   Oral   Take 600 mg of elemental calcium by mouth daily with breakfast.         . Cholecalciferol (VITAMIN D3) 5000 UNITS CAPS   Oral   Take 1  tablet by mouth daily.         . DULoxetine (CYMBALTA) 60 MG capsule   Oral   Take 60 mg by mouth daily.         Marland Kitchen FOLIC ACID PO   Oral   Take 1 mg by mouth daily.          Marland Kitchen HYDROcodone-acetaminophen (NORCO) 5-325 MG tablet   Oral   Take 1-2 tablets by mouth every 6 (six) hours as needed for moderate pain or severe pain.   50 tablet   0   . lubiprostone (AMITIZA) 24 MCG capsule   Oral   Take 24 mcg by mouth daily as needed for constipation.         . meclizine (ANTIVERT) 25 MG tablet   Oral   Take 25 mg by mouth 3 (three) times daily as needed for dizziness.         . methocarbamol (ROBAXIN) 750 MG tablet   Oral   Take 750 mg by mouth  daily as needed for muscle spasms.         . metoprolol succinate (TOPROL-XL) 100 MG 24 hr tablet   Oral   Take 100 mg by mouth daily. Take with or immediately following a meal.         . Multiple Vitamins-Minerals (ONE-A-DAY WOMENS 50 PLUS PO)   Oral   Take 1 tablet by mouth daily.         . nitroGLYCERIN (NITROLINGUAL) 0.4 MG/SPRAY spray   Sublingual   Place 1 spray under the tongue every 5 (five) minutes x 3 doses as needed for chest pain.         . Omega-3 Fatty Acids (FISH OIL) 1000 MG CPDR   Oral   Take by mouth once.         . Oxycodone HCl 10 MG TABS      Limit  1  tab po bid - qid if tolerated daily   120 tablet   0   . pantoprazole (PROTONIX) 40 MG tablet   Oral   Take 40 mg by mouth 2 (two) times daily.         . Pramoxine-Dimethicone 1-6 % CREA   Apply externally   Apply topically.         . sodium bicarbonate 650 MG tablet   Oral   Take 650 mg by mouth 2 (two) times daily.           Allergies Nystatin and Sulfa antibiotics  Family History  Problem Relation Age of Onset  . Cancer Mother   . Cancer Father   . Diabetes Brother   . Heart disease Brother     Social History Social History  Substance Use Topics  . Smoking status: Current Every Day Smoker -- 0.50 packs/day    Types: Cigarettes  . Smokeless tobacco: Not on file  . Alcohol Use: No    Review of Systems  Constitutional: Positive for fever. Eyes: Negative for visual changes. ENT: Negative for sore throat. Cardiovascular: Negative for chest pain. Respiratory:Positive for cough and shortness of breath Gastrointestinal: Negative for abdominal pain, vomiting and diarrhea. Genitourinary: Negative for dysuria. Musculoskeletal: Negative for back pain. Skin: Negative for rash. Neurological: Negative for headaches, focal weakness or numbness.   10-point ROS otherwise negative.  ____________________________________________   PHYSICAL EXAM:  VITAL SIGNS: ED Triage  Vitals  Enc Vitals Group     BP 12/14/15 1944 153/67 mmHg     Pulse Rate  12/14/15 1943 88     Resp 12/14/15 1943 18     Temp 12/14/15 1954 98.8 F (37.1 C)     Temp Source 12/14/15 1954 Oral     SpO2 12/14/15 1943 94 %     Weight 12/14/15 1943 238 lb 1.6 oz (108 kg)     Height 12/14/15 1943 5\' 6"  (1.676 m)     Head Cir --      Peak Flow --      Pain Score 12/14/15 1945 10     Pain Loc --      Pain Edu? --      Excl. in [place]? --     Constitutional: Alert and oriented. Well appearing and in no distress. Eyes: Conjunctivae are normal. PERRL. Normal extraocular movements. ENT   Head: Normocephalic and atraumatic.   Nose: No congestion/rhinnorhea.   Mouth/Throat: Mucous membranes are moist.   Neck: No stridor. Hematological/Lymphatic/Immunilogical: No cervical lymphadenopathy. Cardiovascular: Normal rate, regular rhythm. Normal and symmetric distal pulses are present in all extremities. No murmurs, rubs, or gallops. Respiratory: Normal respiratory effort without tachypnea nor retractions. Breath sounds are clear and equal bilaterally. Bibasilar rhonchi noted auscultation. Gastrointestinal: Soft and nontender. No distention. There is no CVA tenderness. Genitourinary: deferred Musculoskeletal: Nontender with normal range of motion in all extremities. No joint effusions.  No lower extremity tenderness nor edema. Neurologic:  Normal speech and language. No gross focal neurologic deficits are appreciated. Speech is normal.  Skin:  Skin is warm, dry and intact. No rash noted. Psychiatric: Mood and affect are normal. Speech and behavior are normal. Patient exhibits appropriate insight and judgment.  ____________________________________________    LABS (pertinent positives/negatives)  Labs Reviewed  CBC WITH DIFFERENTIAL/PLATELET - Abnormal; Notable for the following:    RBC 2.88 (*)    Hemoglobin 8.1 (*)    HCT 25.1 (*)    RDW 16.3 (*)    Lymphs Abs 0.6 (*)    All other  components within normal limits  COMPREHENSIVE METABOLIC PANEL - Abnormal; Notable for the following:    Potassium 2.8 (*)    Chloride 96 (*)    Glucose, Bld 134 (*)    Creatinine, Ser 2.63 (*)    Calcium 8.3 (*)    Albumin 3.4 (*)    ALT 12 (*)    GFR calc non Af Amer 18 (*)    GFR calc Af Amer 21 (*)    All other components within normal limits  CULTURE, BLOOD (ROUTINE X 2)  CULTURE, BLOOD (ROUTINE X 2)  RAPID INFLUENZA A&B ANTIGENS ([facility] ONLY)  TROPONIN I  URINALYSIS COMPLETEWITH MICROSCOPIC ([facility] ONLY)     ____________________________________________   EKG  ED ECG REPORT I, [place] N BROWN, the attending physician, personally viewed and interpreted this ECG.   Date: 12/14/2015  EKG Time: 7:50 PM  Rate: 79  Rhythm: Normal sinus rhythm  Axis: Normal  Intervals: Normal  ST&T Change: Lateral T-wave inversion V5 V6   ____________________________________________    RADIOLOGY  G Chest 2 View (Final result) Result time: 12/14/15 20:09:41   Final result by Rad Results In Interface (12/14/15 20:09:41)   Narrative:   CLINICAL DATA: Fever, chills, shortness of breath.  EXAM: CHEST 2 VIEW  COMPARISON: October 29, 2015.  FINDINGS: Stable cardiomegaly. No pneumothorax or pleural effusion is noted. Right internal jugular dialysis catheter is noted with distal tip in expected position of the SVC. Minimally increased interstitial densities are noted in the perihilar and basilar regions suggesting possible  minimal pulmonary edema. Bony thorax is unremarkable.  IMPRESSION: Minimally increased interstitial densities are noted in the bilateral perihilar and basilar regions suggesting possible minimal pulmonary edema.   Electronically Signed By: Marijo Conception, M.D. On: 12/14/2015 20:09            INITIAL IMPRESSION / ASSESSMENT AND PLAN / ED COURSE  Pertinent labs & imaging results that were available during my care of the patient were  reviewed by me and considered in my medical decision making (see chart for details).    ____________________________________________   FINAL CLINICAL IMPRESSION(S) / ED DIAGNOSES  Final diagnoses:  Acute bronchitis, unspecified organism       Gregor Hams, MD 12/15/15 [id number]

## 2015-12-15 DIAGNOSIS — J209 Acute bronchitis, unspecified: Secondary | ICD-10-CM | POA: Diagnosis not present

## 2015-12-15 LAB — URINALYSIS COMPLETE WITH MICROSCOPIC (ARMC ONLY)
BACTERIA UA: NONE SEEN
Bilirubin Urine: NEGATIVE
Glucose, UA: 150 mg/dL — AB
Hgb urine dipstick: NEGATIVE
KETONES UR: NEGATIVE mg/dL
LEUKOCYTES UA: NEGATIVE
Nitrite: NEGATIVE
PH: 9 — AB (ref 5.0–8.0)
Specific Gravity, Urine: 1.008 (ref 1.005–1.030)

## 2015-12-15 LAB — RAPID INFLUENZA A&B ANTIGENS: Influenza B (ARMC): NEGATIVE

## 2015-12-15 LAB — RAPID INFLUENZA A&B ANTIGENS (ARMC ONLY): INFLUENZA A (ARMC): NEGATIVE

## 2015-12-15 MED ORDER — LEVOFLOXACIN 500 MG PO TABS
500.0000 mg | ORAL_TABLET | Freq: Once | ORAL | Status: AC
  Administered 2015-12-15: 500 mg via ORAL
  Filled 2015-12-15: qty 1

## 2015-12-15 MED ORDER — LEVOFLOXACIN 500 MG PO TABS: 500.0000 mg | ORAL_TABLET | Freq: Once | ORAL | Status: DC

## 2015-12-15 NOTE — Discharge Instructions (Signed)

## 2015-12-15 NOTE — ED Notes (Signed)
Pt sleeping. 

## 2015-12-16 ENCOUNTER — Emergency Department: Payer: Medicare HMO

## 2015-12-16 ENCOUNTER — Observation Stay
Admission: EM | Admit: 2015-12-16 | Discharge: 2015-12-20 | Disposition: A | Discharge status: Home / Self Care | Payer: Medicare HMO | Attending: Internal Medicine | Admitting: Internal Medicine

## 2015-12-16 DIAGNOSIS — T82898A Other specified complication of vascular prosthetic devices, implants and grafts, initial encounter: Secondary | ICD-10-CM | POA: Diagnosis not present

## 2015-12-16 DIAGNOSIS — Z96653 Presence of artificial knee joint, bilateral: Secondary | ICD-10-CM | POA: Insufficient documentation

## 2015-12-16 DIAGNOSIS — Z79899 Other long term (current) drug therapy: Secondary | ICD-10-CM | POA: Diagnosis not present

## 2015-12-16 DIAGNOSIS — E785 Hyperlipidemia, unspecified: Secondary | ICD-10-CM | POA: Diagnosis not present

## 2015-12-16 DIAGNOSIS — I517 Cardiomegaly: Secondary | ICD-10-CM | POA: Diagnosis not present

## 2015-12-16 DIAGNOSIS — Z955 Presence of coronary angioplasty implant and graft: Secondary | ICD-10-CM | POA: Diagnosis not present

## 2015-12-16 DIAGNOSIS — I10 Essential (primary) hypertension: Secondary | ICD-10-CM | POA: Diagnosis not present

## 2015-12-16 DIAGNOSIS — E7800 Pure hypercholesterolemia, unspecified: Secondary | ICD-10-CM | POA: Diagnosis not present

## 2015-12-16 DIAGNOSIS — D638 Anemia in other chronic diseases classified elsewhere: Secondary | ICD-10-CM | POA: Diagnosis not present

## 2015-12-16 DIAGNOSIS — R778 Other specified abnormalities of plasma proteins: Secondary | ICD-10-CM | POA: Diagnosis not present

## 2015-12-16 DIAGNOSIS — R531 Weakness: Secondary | ICD-10-CM | POA: Insufficient documentation

## 2015-12-16 DIAGNOSIS — N186 End stage renal disease: Secondary | ICD-10-CM | POA: Diagnosis not present

## 2015-12-16 DIAGNOSIS — M79604 Pain in right leg: Secondary | ICD-10-CM | POA: Diagnosis not present

## 2015-12-16 DIAGNOSIS — E669 Obesity, unspecified: Secondary | ICD-10-CM | POA: Insufficient documentation

## 2015-12-16 DIAGNOSIS — E876 Hypokalemia: Secondary | ICD-10-CM | POA: Diagnosis not present

## 2015-12-16 DIAGNOSIS — I739 Peripheral vascular disease, unspecified: Secondary | ICD-10-CM | POA: Insufficient documentation

## 2015-12-16 DIAGNOSIS — R9431 Abnormal electrocardiogram [ECG] [EKG]: Secondary | ICD-10-CM | POA: Diagnosis present

## 2015-12-16 DIAGNOSIS — M179 Osteoarthritis of knee, unspecified: Secondary | ICD-10-CM | POA: Diagnosis not present

## 2015-12-16 DIAGNOSIS — Z6837 Body mass index (BMI) 37.0-37.9, adult: Secondary | ICD-10-CM | POA: Insufficient documentation

## 2015-12-16 DIAGNOSIS — I252 Old myocardial infarction: Secondary | ICD-10-CM | POA: Diagnosis not present

## 2015-12-16 DIAGNOSIS — J440 Chronic obstructive pulmonary disease with acute lower respiratory infection: Secondary | ICD-10-CM | POA: Insufficient documentation

## 2015-12-16 DIAGNOSIS — R509 Fever, unspecified: Secondary | ICD-10-CM | POA: Diagnosis not present

## 2015-12-16 DIAGNOSIS — N2581 Secondary hyperparathyroidism of renal origin: Secondary | ICD-10-CM | POA: Insufficient documentation

## 2015-12-16 DIAGNOSIS — J441 Chronic obstructive pulmonary disease with (acute) exacerbation: Secondary | ICD-10-CM | POA: Diagnosis not present

## 2015-12-16 DIAGNOSIS — Z888 Allergy status to other drugs, medicaments and biological substances status: Secondary | ICD-10-CM | POA: Insufficient documentation

## 2015-12-16 DIAGNOSIS — I2510 Atherosclerotic heart disease of native coronary artery without angina pectoris: Secondary | ICD-10-CM | POA: Diagnosis not present

## 2015-12-16 DIAGNOSIS — F17210 Nicotine dependence, cigarettes, uncomplicated: Secondary | ICD-10-CM | POA: Diagnosis not present

## 2015-12-16 DIAGNOSIS — I120 Hypertensive chronic kidney disease with stage 5 chronic kidney disease or end stage renal disease: Secondary | ICD-10-CM | POA: Diagnosis not present

## 2015-12-16 DIAGNOSIS — E1122 Type 2 diabetes mellitus with diabetic chronic kidney disease: Secondary | ICD-10-CM | POA: Insufficient documentation

## 2015-12-16 DIAGNOSIS — K429 Umbilical hernia without obstruction or gangrene: Secondary | ICD-10-CM | POA: Diagnosis not present

## 2015-12-16 DIAGNOSIS — R197 Diarrhea, unspecified: Secondary | ICD-10-CM | POA: Diagnosis not present

## 2015-12-16 DIAGNOSIS — J479 Bronchiectasis, uncomplicated: Secondary | ICD-10-CM | POA: Diagnosis not present

## 2015-12-16 DIAGNOSIS — I513 Intracardiac thrombosis, not elsewhere classified: Secondary | ICD-10-CM | POA: Diagnosis not present

## 2015-12-16 DIAGNOSIS — M5416 Radiculopathy, lumbar region: Secondary | ICD-10-CM | POA: Diagnosis not present

## 2015-12-16 DIAGNOSIS — N281 Cyst of kidney, acquired: Secondary | ICD-10-CM | POA: Diagnosis not present

## 2015-12-16 DIAGNOSIS — Z7982 Long term (current) use of aspirin: Secondary | ICD-10-CM | POA: Diagnosis not present

## 2015-12-16 DIAGNOSIS — Z882 Allergy status to sulfonamides status: Secondary | ICD-10-CM | POA: Diagnosis not present

## 2015-12-16 DIAGNOSIS — R079 Chest pain, unspecified: Secondary | ICD-10-CM | POA: Diagnosis not present

## 2015-12-16 DIAGNOSIS — Z7951 Long term (current) use of inhaled steroids: Secondary | ICD-10-CM | POA: Insufficient documentation

## 2015-12-16 DIAGNOSIS — Z8673 Personal history of transient ischemic attack (TIA), and cerebral infarction without residual deficits: Secondary | ICD-10-CM | POA: Insufficient documentation

## 2015-12-16 DIAGNOSIS — R0602 Shortness of breath: Secondary | ICD-10-CM | POA: Insufficient documentation

## 2015-12-16 DIAGNOSIS — Z833 Family history of diabetes mellitus: Secondary | ICD-10-CM | POA: Insufficient documentation

## 2015-12-16 DIAGNOSIS — Z809 Family history of malignant neoplasm, unspecified: Secondary | ICD-10-CM | POA: Insufficient documentation

## 2015-12-16 DIAGNOSIS — K5790 Diverticulosis of intestine, part unspecified, without perforation or abscess without bleeding: Secondary | ICD-10-CM | POA: Insufficient documentation

## 2015-12-16 DIAGNOSIS — D631 Anemia in chronic kidney disease: Secondary | ICD-10-CM | POA: Insufficient documentation

## 2015-12-16 DIAGNOSIS — Z951 Presence of aortocoronary bypass graft: Secondary | ICD-10-CM | POA: Insufficient documentation

## 2015-12-16 DIAGNOSIS — Z8249 Family history of ischemic heart disease and other diseases of the circulatory system: Secondary | ICD-10-CM | POA: Diagnosis not present

## 2015-12-16 DIAGNOSIS — M5137 Other intervertebral disc degeneration, lumbosacral region: Secondary | ICD-10-CM | POA: Diagnosis not present

## 2015-12-16 DIAGNOSIS — M542 Cervicalgia: Secondary | ICD-10-CM

## 2015-12-16 DIAGNOSIS — X58XXXA Exposure to other specified factors, initial encounter: Secondary | ICD-10-CM | POA: Insufficient documentation

## 2015-12-16 DIAGNOSIS — I209 Angina pectoris, unspecified: Secondary | ICD-10-CM | POA: Diagnosis present

## 2015-12-16 DIAGNOSIS — Z992 Dependence on renal dialysis: Secondary | ICD-10-CM | POA: Diagnosis not present

## 2015-12-16 DIAGNOSIS — I714 Abdominal aortic aneurysm, without rupture: Secondary | ICD-10-CM | POA: Diagnosis not present

## 2015-12-16 DIAGNOSIS — F329 Major depressive disorder, single episode, unspecified: Secondary | ICD-10-CM | POA: Diagnosis not present

## 2015-12-16 DIAGNOSIS — R109 Unspecified abdominal pain: Secondary | ICD-10-CM | POA: Diagnosis not present

## 2015-12-16 HISTORY — DX: Unspecified asthma, uncomplicated: J45.909

## 2015-12-16 HISTORY — DX: Disorder of kidney and ureter, unspecified: N28.9

## 2015-12-16 LAB — CBC
HCT: 26.8 % — ABNORMAL LOW (ref 35.0–47.0)
Hemoglobin: 8.5 g/dL — ABNORMAL LOW (ref 12.0–16.0)
MCH: 27.4 pg (ref 26.0–34.0)
MCHC: 31.9 g/dL — ABNORMAL LOW (ref 32.0–36.0)
MCV: 86 fL (ref 80.0–100.0)
PLATELETS: 180 10*3/uL (ref 150–440)
RBC: 3.12 MIL/uL — ABNORMAL LOW (ref 3.80–5.20)
RDW: 16.1 % — AB (ref 11.5–14.5)
WBC: 5 10*3/uL (ref 3.6–11.0)

## 2015-12-16 LAB — BASIC METABOLIC PANEL
ANION GAP: 9 (ref 5–15)
BUN: 10 mg/dL (ref 6–20)
CHLORIDE: 95 mmol/L — AB (ref 101–111)
CO2: 31 mmol/L (ref 22–32)
CREATININE: 2.67 mg/dL — AB (ref 0.44–1.00)
Calcium: 8.5 mg/dL — ABNORMAL LOW (ref 8.9–10.3)
GFR calc non Af Amer: 18 mL/min — ABNORMAL LOW (ref >60)
GFR, EST AFRICAN AMERICAN: 21 mL/min — AB (ref >60)
GLUCOSE: 99 mg/dL (ref 65–99)
Potassium: 2.8 mmol/L — CL (ref 3.5–5.1)
SODIUM: 135 mmol/L (ref 135–145)

## 2015-12-16 LAB — TROPONIN I: Troponin I: 0.03 ng/mL (ref <0.031)

## 2015-12-16 MED ORDER — HYDROMORPHONE HCL 1 MG/ML IJ SOLN
1.0000 mg | Freq: Once | INTRAMUSCULAR | Status: AC
  Administered 2015-12-16: 1 mg via INTRAVENOUS
  Filled 2015-12-16: qty 1

## 2015-12-16 MED ORDER — ONDANSETRON HCL 4 MG/2ML IJ SOLN
4.0000 mg | Freq: Once | INTRAMUSCULAR | Status: AC
  Administered 2015-12-16: 4 mg via INTRAVENOUS
  Filled 2015-12-16: qty 2

## 2015-12-16 NOTE — ED Notes (Signed)
Pt in with co shob was seen here Monday and dx with bronchitis and started on levaquin.  Pt became more shob today and developed a fever  Pt is on dialysis and had normal treatment today.

## 2015-12-16 NOTE — ED Notes (Addendum)
k 2.8 critical lab called . MD notified

## 2015-12-16 NOTE — ED Notes (Signed)
Pt in via triage, coming from dialysis treatment today.  Pt with complaints of new onset RLQ abdominal pain occurring during and after dialysis treatment.  Pt reports some nausea, denies any vomiting, diarrhea.  Pt febrile at 99.4, other vitals WDL.

## 2015-12-16 NOTE — ED Provider Notes (Addendum)
[facility] Emergency Department Provider Note  ____________________________________________  Time seen: Approximately 10:29 PM  I have reviewed the triage vital signs and the nursing notes.   HISTORY  Chief Complaint Shortness of Breath    HPI Kimberly Shaffer is a 65 y.o. female with ESRD on HD,CAD status post CABG and stent placement presenting with neck pain, chest pain, abdominal pain and right leg pain. Patient reports that she was at dialysis when she developed an acute onset of a right-sided neck pain radiating into the right shoulder associated with chest pain, shortness of breath and a tightness feeling, abdominal pain on the right side and radiation into the right leg. The pain is worse with positional changes. She completed her dialysis and when she went home the symptoms did not improve. She did a breathing treatment and this significantly improved her chest pain. She cannot describe the type of pain that she is having ominous she cannot describe the character. No lightheadedness or syncope. She was seen here several days ago and is under treatment for bronchitis.   Past Medical History  Diagnosis Date  . Myocardial infarction ([place])   . COPD (chronic obstructive pulmonary disease) ([place])   . Hypertension   . Obesity   . Depression   . Hypercholesteremia   . H/O blood clots   . Stroke ([place])   . H/O angina pectoris   . CAD (coronary artery disease)   . Diabetes mellitus without complication ([facility])     NIDD  . Vertigo, aural     Patient Active Problem List   Diagnosis Date Noted  . DJD of shoulder 10/21/2015  . 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 Lumbar radiculopathy 05/14/2015  . 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 Lumbosacral facet joint syndrome 02/16/2015  . Greater trochanteric bursitis 02/16/2015  . DDD (degenerative disc disease), lumbosacral 01/20/2015  . 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 DJD (degenerative joint disease) shoulder 01/20/2015    Past Surgical History  Procedure Laterality Date  . Cardiac catheterization    . Coronary stent placement    . Knee surgery Bilateral   . Replacement total knee bilateral Bilateral   . Coronary artery bypass graft  2012  . Joint replacement    . Av fistula placement Left 11-04-2015  . Av fistula placement Left 11/04/2015    Procedure: ARTERIOVENOUS (AV) FISTULA CREATION  ( BRACHIAL CEPHALIC );  Surgeon: Katha Cabal, MD;  Location: [facility] ORS;  Service: Vascular;  Laterality: Left;  . Peripheral vascular catheterization N/A 11/13/2015    Procedure: Dialysis/Perma Catheter Insertion;  Surgeon: Katha Cabal, MD;  Location: [facility];  Service: Cardiovascular;  Laterality: N/A;  . Peripheral vascular catheterization N/A 12/04/2015    Procedure: Dialysis/Perma Catheter Insertion;  Surgeon: Katha Cabal, MD;  Location: [facility];  Service: Cardiovascular;  Laterality: N/A;    Current Outpatient Rx  Name  Route  Sig  Dispense  Refill  . albuterol (PROVENTIL HFA;VENTOLIN HFA) 108 (90 BASE) MCG/ACT inhaler   Inhalation   Inhale into the lungs every 4 (four) hours as needed for wheezing or shortness of breath. 2-4 puffs         . amLODipine (NORVASC) 10 MG tablet   Oral   Take 10 mg by mouth daily.         Marland Kitchen aspirin 81 MG tablet   Oral   Take 81 mg by mouth daily.         Marland Kitchen  atorvastatin (LIPITOR) 80 MG tablet   Oral   Take 80 mg by mouth daily.         . calcium carbonate (OSCAL) 1500 (600 Ca) MG TABS tablet   Oral   Take 600 mg of elemental calcium by mouth daily with breakfast.         . Cholecalciferol (VITAMIN D3) 5000 UNITS CAPS   Oral   Take 1 tablet by mouth daily.         . DULoxetine (CYMBALTA) 60 MG capsule   Oral   Take 60 mg by mouth daily.         Marland Kitchen FOLIC ACID PO   Oral   Take 1 mg by mouth daily.          Marland Kitchen HYDROcodone-acetaminophen (NORCO)  5-325 MG tablet   Oral   Take 1-2 tablets by mouth every 6 (six) hours as needed for moderate pain or severe pain.   50 tablet   0   . levofloxacin (LEVAQUIN) 500 MG tablet   Oral   Take 1 tablet (500 mg total) by mouth once.   7 tablet   0   . lubiprostone (AMITIZA) 24 MCG capsule   Oral   Take 24 mcg by mouth daily as needed for constipation.         . meclizine (ANTIVERT) 25 MG tablet   Oral   Take 25 mg by mouth 3 (three) times daily as needed for dizziness.         . methocarbamol (ROBAXIN) 750 MG tablet   Oral   Take 750 mg by mouth daily as needed for muscle spasms.         . metoprolol succinate (TOPROL-XL) 100 MG 24 hr tablet   Oral   Take 100 mg by mouth daily. Take with or immediately following a meal.         . Multiple Vitamins-Minerals (ONE-A-DAY WOMENS 50 PLUS PO)   Oral   Take 1 tablet by mouth daily.         . nitroGLYCERIN (NITROLINGUAL) 0.4 MG/SPRAY spray   Sublingual   Place 1 spray under the tongue every 5 (five) minutes x 3 doses as needed for chest pain.         . Omega-3 Fatty Acids (FISH OIL) 1000 MG CPDR   Oral   Take by mouth once.         . Oxycodone HCl 10 MG TABS      Limit  1  tab po bid - qid if tolerated daily   120 tablet   0   . pantoprazole (PROTONIX) 40 MG tablet   Oral   Take 40 mg by mouth 2 (two) times daily.         . Pramoxine-Dimethicone 1-6 % CREA   Apply externally   Apply topically.         . sodium bicarbonate 650 MG tablet   Oral   Take 650 mg by mouth 2 (two) times daily.           Allergies Nystatin and Sulfa antibiotics  Family History  Problem Relation Age of Onset  . Cancer Mother   . Cancer Father   . Diabetes Brother   . Heart disease Brother     Social History Social History  Substance Use Topics  . Smoking status: Current Every Day Smoker -- 0.50 packs/day    Types: Cigarettes  . Smokeless tobacco: Not on file  . Alcohol Use:  No    Review of  Systems Constitutional: No fever/chills.No lightheadedness or syncope. Eyes: No visual changes. No blurred or double vision. ENT: No sore throat. No congestion or rhinorrhea. Cardiovascular: Positive chest pain. Denies palpitations. Respiratory: Positive shortness of breath.  No cough. Gastrointestinal: Positive right-sided diffuse abdominal pain.  No nausea, no vomiting.  No diarrhea.  No constipation. Genitourinary: No longer urinates. Musculoskeletal: Negative for back pain. Positive right neck and right shoulder pain, right leg pain. Skin: Negative for rash. Neurological: Negative for headaches. No focal numbness, tingling or weakness.   10-point ROS otherwise negative.  ____________________________________________   PHYSICAL EXAM:  VITAL SIGNS: ED Triage Vitals  Enc Vitals Group     BP 12/16/15 2052 138/63 mmHg     Pulse Rate 12/16/15 2052 88     Resp 12/16/15 2052 18     Temp 12/16/15 2052 99.7 F (37.6 C)     Temp Source 12/16/15 2052 Oral     SpO2 12/16/15 2052 100 %     Weight 12/16/15 2052 238 lb 1.6 oz (108 kg)     Height 12/16/15 2052 5\' 6"  (1.676 m)     Head Cir --      Peak Flow --      Pain Score 12/16/15 2053 10     Pain Loc --      Pain Edu? --      Excl. in [place]? --     Constitutional: Patient is alert and oriented and answering questions appropriately. She is uncomfortable appearing but not in extremis.  Eyes: Conjunctivae are normal.  EOMI. No scleral icterus. Head: Atraumatic. Nose: No congestion/rhinnorhea. Mouth/Throat: Mucous membranes are moist.  Neck: No stridor.  Supple.  No midline C-spine tenderness, step-offs or deformities. Positive JVD. Positive tenderness to palpation on the right lateral neck and supraclavicular area. No skin changes over these areas. Cardiovascular: Normal rate, regular rhythm. No murmurs, rubs or gallops. R upper permcath w/o swelling, drainage or ttp. Respiratory: Mildly increased respiratory effort.  No accessory  muscle use or retractions. Lungs CTAB.  No wheezes, rales or ronchi. Gastrointestinal: Soft, nontender and nondistended.  I am unable to reproduce the patient's abdominal pain although she does state that it is on the right side in the upper and lower quadrant. No guarding or rebound.  No peritoneal signs. Musculoskeletal: No LE edema. No ttp in the calves or palpable cords.  Negative Homan's sign. Full range of motion of the right hip knee and ankle. Normal right femoral pulse. Neurologic:  A&Ox3.  Speech is clear.  Face and smile are symmetric.  EOMI.  Moves all extremities well. Skin:  Skin is warm, dry and intact. No rash noted. Psychiatric: Mood and affect are normal. Speech and behavior are normal.  Normal judgement.  ____________________________________________   LABS (all labs ordered are listed, but only abnormal results are displayed)  Labs Reviewed  CBC - Abnormal; Notable for the following:    RBC 3.12 (*)    Hemoglobin 8.5 (*)    HCT 26.8 (*)    MCHC 31.9 (*)    RDW 16.1 (*)    All other components within normal limits  BASIC METABOLIC PANEL - Abnormal; Notable for the following:    Potassium 2.8 (*)    Chloride 95 (*)    Creatinine, Ser 2.67 (*)    Calcium 8.5 (*)    GFR calc non Af Amer 18 (*)    GFR calc Af Amer 21 (*)    All  other components within normal limits  TROPONIN I   ____________________________________________  EKG  ED ECG REPORT EKG is pending at the time that I am leaving, and Dr. Nancy Fetter will followed up and dictate the results.  ____________________________________________  RADIOLOGY  No results found.  ____________________________________________   PROCEDURES  Procedure(s) performed: None  Critical Care performed: No ____________________________________________   INITIAL IMPRESSION / ASSESSMENT AND PLAN / ED COURSE  Pertinent labs & imaging results that were available during my care of the patient were reviewed by me and considered  in my medical decision making (see chart for details).  65 y.o. with ESRD on hemodialysis having completed dialysis today presenting with right neck, chest, right-sided abdominal and right lower extremity pain. The patient is a vague historian and has given multiple different chief complaints in triage, then the initial nurse, and then to me. Overall, she is nontoxic appearing, but she is a chronically ill patient and I'm concerned with her diffuse pain from neck to leg, about ruling out aortic dissection. I cannot think of any other etiology that would affect her with pain on the entirety of the right side. She may have musculoskeletal pain but it would be affecting multiple areas. Acute MI or ACS is less likely. We will treat her symptomatically, and get a CT scan to evaluate her aorta in the chest abdomen and pelvis.  I will sign the patient out to Dr. Beather Arbour who will follow-up both on her clinical symptoms, as well as the results of her CT scan.  ____________________________________________  FINAL CLINICAL IMPRESSION(S) / ED DIAGNOSES  Final diagnoses:  Chest pain, unspecified chest pain type  Shortness of breath  Neck pain  Right sided abdominal pain  Right leg pain      NEW MEDICATIONS STARTED DURING THIS VISIT:  New Prescriptions   No medications on file     Eula Listen, MD 12/16/15 2352  Eula Listen, MD 12/17/15 0004

## 2015-12-17 ENCOUNTER — Emergency Department: Payer: Medicare HMO

## 2015-12-17 ENCOUNTER — Observation Stay
Admit: 2015-12-17 | Discharge: 2015-12-17 | Disposition: A | Discharge status: Home / Self Care | Payer: Medicare HMO | Attending: Internal Medicine | Admitting: Internal Medicine

## 2015-12-17 ENCOUNTER — Encounter: Payer: Self-pay | Admitting: Radiology

## 2015-12-17 DIAGNOSIS — I209 Angina pectoris, unspecified: Secondary | ICD-10-CM | POA: Diagnosis present

## 2015-12-17 DIAGNOSIS — R079 Chest pain, unspecified: Secondary | ICD-10-CM | POA: Diagnosis present

## 2015-12-17 DIAGNOSIS — T82898A Other specified complication of vascular prosthetic devices, implants and grafts, initial encounter: Secondary | ICD-10-CM | POA: Diagnosis not present

## 2015-12-17 LAB — LIPID PANEL
Cholesterol: 118 mg/dL (ref 0–200)
HDL: 24 mg/dL — AB (ref >40)
LDL Cholesterol: 66 mg/dL (ref 0–99)
TRIGLYCERIDES: 138 mg/dL (ref <150)
Total CHOL/HDL Ratio: 4.9 RATIO
VLDL: 28 mg/dL (ref 0–40)

## 2015-12-17 LAB — CBC
HCT: 25.8 % — ABNORMAL LOW (ref 35.0–47.0)
HEMOGLOBIN: 8.2 g/dL — AB (ref 12.0–16.0)
MCH: 27.5 pg (ref 26.0–34.0)
MCHC: 31.6 g/dL — ABNORMAL LOW (ref 32.0–36.0)
MCV: 87 fL (ref 80.0–100.0)
PLATELETS: 161 10*3/uL (ref 150–440)
RBC: 2.97 MIL/uL — AB (ref 3.80–5.20)
RDW: 16.3 % — ABNORMAL HIGH (ref 11.5–14.5)
WBC: 4 10*3/uL (ref 3.6–11.0)

## 2015-12-17 LAB — BASIC METABOLIC PANEL
ANION GAP: 7 (ref 5–15)
BUN: 13 mg/dL (ref 6–20)
CALCIUM: 8.2 mg/dL — AB (ref 8.9–10.3)
CO2: 29 mmol/L (ref 22–32)
CREATININE: 3.57 mg/dL — AB (ref 0.44–1.00)
Chloride: 97 mmol/L — ABNORMAL LOW (ref 101–111)
GFR, EST AFRICAN AMERICAN: 14 mL/min — AB (ref >60)
GFR, EST NON AFRICAN AMERICAN: 13 mL/min — AB (ref >60)
Glucose, Bld: 99 mg/dL (ref 65–99)
Potassium: 3.3 mmol/L — ABNORMAL LOW (ref 3.5–5.1)
Sodium: 133 mmol/L — ABNORMAL LOW (ref 135–145)

## 2015-12-17 LAB — TROPONIN I
TROPONIN I: 0.1 ng/mL — AB (ref <0.031)
TROPONIN I: 0.09 ng/mL — AB (ref <0.031)
TROPONIN I: 0.1 ng/mL — AB (ref <0.031)

## 2015-12-17 MED ORDER — LUBIPROSTONE 24 MCG PO CAPS
24.0000 ug | ORAL_CAPSULE | Freq: Every day | ORAL | Status: DC
  Administered 2015-12-17 – 2015-12-20 (×4): 24 ug via ORAL
  Filled 2015-12-17 (×5): qty 1

## 2015-12-17 MED ORDER — SODIUM CHLORIDE 0.9% FLUSH
3.0000 mL | Freq: Two times a day (BID) | INTRAVENOUS | Status: DC
  Administered 2015-12-17 – 2015-12-19 (×6): 3 mL via INTRAVENOUS

## 2015-12-17 MED ORDER — ASPIRIN 300 MG RE SUPP: 300.0000 mg | RECTAL | Status: AC

## 2015-12-17 MED ORDER — SODIUM CHLORIDE 0.9 % IV SOLN: 250.0000 mL | INTRAVENOUS | Status: DC | PRN

## 2015-12-17 MED ORDER — IOPAMIDOL (ISOVUE-370) INJECTION 76%
100.0000 mL | Freq: Once | INTRAVENOUS | Status: AC | PRN
  Administered 2015-12-17: 100 mL via INTRAVENOUS

## 2015-12-17 MED ORDER — ONDANSETRON HCL 4 MG/2ML IJ SOLN: 4.0000 mg | Freq: Four times a day (QID) | INTRAMUSCULAR | Status: DC | PRN

## 2015-12-17 MED ORDER — ASPIRIN 81 MG PO CHEW
81.0000 mg | CHEWABLE_TABLET | Freq: Every day | ORAL | Status: DC
  Administered 2015-12-18 – 2015-12-20 (×3): 81 mg via ORAL
  Filled 2015-12-17 (×3): qty 1

## 2015-12-17 MED ORDER — NITROGLYCERIN 0.4 MG SL SUBL
0.4000 mg | SUBLINGUAL_TABLET | SUBLINGUAL | Status: DC | PRN
  Administered 2015-12-17: 0.4 mg via SUBLINGUAL
  Filled 2015-12-17: qty 1

## 2015-12-17 MED ORDER — ASPIRIN EC 81 MG PO TBEC: 81.0000 mg | DELAYED_RELEASE_TABLET | Freq: Every day | ORAL | Status: DC

## 2015-12-17 MED ORDER — AMLODIPINE BESYLATE 10 MG PO TABS
10.0000 mg | ORAL_TABLET | Freq: Every day | ORAL | Status: DC
  Administered 2015-12-17 – 2015-12-20 (×4): 10 mg via ORAL
  Filled 2015-12-17 (×4): qty 1

## 2015-12-17 MED ORDER — SODIUM CHLORIDE 0.9% FLUSH: 3.0000 mL | INTRAVENOUS | Status: DC | PRN

## 2015-12-17 MED ORDER — ASPIRIN 81 MG PO CHEW
324.0000 mg | CHEWABLE_TABLET | Freq: Once | ORAL | Status: AC
  Administered 2015-12-17: 324 mg via ORAL
  Filled 2015-12-17: qty 4

## 2015-12-17 MED ORDER — ALBUTEROL SULFATE (2.5 MG/3ML) 0.083% IN NEBU: 2.5000 mg | INHALATION_SOLUTION | RESPIRATORY_TRACT | Status: DC | PRN

## 2015-12-17 MED ORDER — ACETAMINOPHEN 325 MG PO TABS
650.0000 mg | ORAL_TABLET | ORAL | Status: DC | PRN
  Administered 2015-12-17 – 2015-12-19 (×5): 650 mg via ORAL
  Filled 2015-12-17 (×4): qty 2

## 2015-12-17 MED ORDER — HYDROCODONE-ACETAMINOPHEN 5-325 MG PO TABS
1.0000 | ORAL_TABLET | Freq: Four times a day (QID) | ORAL | Status: DC | PRN
  Administered 2015-12-19: 2 via ORAL
  Administered 2015-12-19: 1 via ORAL
  Filled 2015-12-17: qty 1
  Filled 2015-12-17: qty 2

## 2015-12-17 MED ORDER — PANTOPRAZOLE SODIUM 40 MG PO TBEC
40.0000 mg | DELAYED_RELEASE_TABLET | Freq: Two times a day (BID) | ORAL | Status: DC
  Administered 2015-12-17 – 2015-12-20 (×7): 40 mg via ORAL
  Filled 2015-12-17 (×7): qty 1

## 2015-12-17 MED ORDER — ALBUTEROL SULFATE HFA 108 (90 BASE) MCG/ACT IN AERS: 1.0000 | INHALATION_SPRAY | RESPIRATORY_TRACT | Status: DC | PRN

## 2015-12-17 MED ORDER — ASPIRIN 81 MG PO CHEW: 324.0000 mg | CHEWABLE_TABLET | ORAL | Status: AC

## 2015-12-17 MED ORDER — POTASSIUM CHLORIDE CRYS ER 20 MEQ PO TBCR
40.0000 meq | EXTENDED_RELEASE_TABLET | Freq: Once | ORAL | Status: AC
  Administered 2015-12-17: 40 meq via ORAL
  Filled 2015-12-17: qty 2

## 2015-12-17 MED ORDER — HEPARIN SODIUM (PORCINE) 5000 UNIT/ML IJ SOLN
5000.0000 [IU] | Freq: Three times a day (TID) | INTRAMUSCULAR | Status: DC
  Administered 2015-12-17 – 2015-12-20 (×7): 5000 [IU] via SUBCUTANEOUS
  Filled 2015-12-17 (×8): qty 1

## 2015-12-17 MED ORDER — FOLIC ACID 1 MG PO TABS
1.0000 mg | ORAL_TABLET | Freq: Every day | ORAL | Status: DC
  Administered 2015-12-17 – 2015-12-20 (×4): 1 mg via ORAL
  Filled 2015-12-17 (×4): qty 1

## 2015-12-17 MED ORDER — ATORVASTATIN CALCIUM 20 MG PO TABS
80.0000 mg | ORAL_TABLET | Freq: Every day | ORAL | Status: DC
  Administered 2015-12-17 – 2015-12-19 (×3): 80 mg via ORAL
  Filled 2015-12-17 (×3): qty 4

## 2015-12-17 MED ORDER — VITAMIN D 1000 UNITS PO TABS
5000.0000 [IU] | ORAL_TABLET | Freq: Every day | ORAL | Status: DC
  Administered 2015-12-17 – 2015-12-19 (×3): 5000 [IU] via ORAL
  Filled 2015-12-17 (×4): qty 5

## 2015-12-17 MED ORDER — METOPROLOL SUCCINATE ER 100 MG PO TB24
100.0000 mg | ORAL_TABLET | Freq: Every day | ORAL | Status: DC
  Administered 2015-12-17 – 2015-12-20 (×4): 100 mg via ORAL
  Filled 2015-12-17 (×4): qty 1

## 2015-12-17 MED ORDER — SODIUM BICARBONATE 650 MG PO TABS
650.0000 mg | ORAL_TABLET | Freq: Two times a day (BID) | ORAL | Status: DC
  Filled 2015-12-17: qty 1

## 2015-12-17 MED ORDER — DULOXETINE HCL 30 MG PO CPEP
60.0000 mg | ORAL_CAPSULE | Freq: Every day | ORAL | Status: DC
  Administered 2015-12-17 – 2015-12-20 (×4): 60 mg via ORAL
  Filled 2015-12-17 (×2): qty 2
  Filled 2015-12-17: qty 3
  Filled 2015-12-17: qty 2

## 2015-12-17 NOTE — Progress Notes (Signed)
[facility]  ROUNDING NOTE   Subjective:   Admitted for chest pain and back pain.  Dialysis yesterday  Objective:  Vital signs in last 24 hours:  Temp:  [99.4 F (37.4 C)-99.7 F (37.6 C)] 99.6 F (37.6 C) (03/30 0727) Pulse Rate:  [79-93] 79 (03/30 0727) Resp:  [18-27] 18 (03/30 0727) BP: (125-167)/(56-88) 143/68 mmHg (03/30 0727) SpO2:  [90 %-100 %] 93 % (03/30 0727) Weight:  [108 kg (238 lb 1.6 oz)] 108 kg (238 lb 1.6 oz) (03/29 2052)  Weight change:  Filed Weights   12/16/15 2052  Weight: 108 kg (238 lb 1.6 oz)    Intake/Output:     Intake/Output this shift:  Total I/O In: 120 [P.O.:120] Out: -   Physical Exam: General: NAD, sitting up in bed  Head: Normocephalic, atraumatic. Moist oral mucosal membranes  Eyes: Anicteric, PERRL  Neck: Supple, trachea midline  Lungs:  Bilateral wheezes  Heart: Regular rate and rhythm  Abdomen:  Soft, nontender,   Extremities: no peripheral edema.  Neurologic: Nonfocal, moving all four extremities  Skin: No lesions  Access: RIJ permcath    Basic Metabolic Panel:  Recent Labs Lab 12/14/15 1953 12/16/15 2254 12/17/15 0821  NA 135 135 133*  K 2.8* 2.8* 3.3*  CL 96* 95* 97*  CO2 29 31 29   GLUCOSE 134* 99 99  BUN 13 10 13   CREATININE 2.63* 2.67* 3.57*  CALCIUM 8.3* 8.5* 8.2*    Liver Function Tests:  Recent Labs Lab 12/14/15 1953  AST 22  ALT 12*  ALKPHOS 80  BILITOT 0.4  PROT 7.3  ALBUMIN 3.4*   No results for input(s): LIPASE, AMYLASE in the last 168 hours. No results for input(s): AMMONIA in the last 168 hours.  CBC:  Recent Labs Lab 12/14/15 1953 12/16/15 2254 12/17/15 0821  WBC 6.4 5.0 4.0  NEUTROABS 4.7  --   --   HGB 8.1* 8.5* 8.2*  HCT 25.1* 26.8* 25.8*  MCV 87.4 86.0 87.0  PLT 191 180 161    Cardiac Enzymes:  Recent Labs Lab 12/14/15 1953 12/16/15 2254 12/17/15 0821  TROPONINI <0.03 0.03 0.10*    BNP: Invalid input(s): POCBNP  CBG: No results for input(s):  GLUCAP in the last 168 hours.  Microbiology: Results for orders placed or performed during the hospital encounter of 12/14/15  Rapid Influenza A&B Antigens ([facility] only)     Status: None   Collection Time: 12/14/15 10:37 PM  Result Value Ref Range Status   Influenza A ([facility]) NEGATIVE NEGATIVE Final   Influenza B ([facility]) NEGATIVE NEGATIVE Final  Blood culture (routine x 2)     Status: None (Preliminary result)   Collection Time: 12/14/15 10:39 PM  Result Value Ref Range Status   Specimen Description BLOOD RIGHT ANTECUBITAL  Final   Special Requests BOTTLES DRAWN AEROBIC AND ANAEROBIC 5ML  Final   Culture NO GROWTH 2 DAYS  Final   Report Status PENDING  Incomplete  Blood culture (routine x 2)     Status: None (Preliminary result)   Collection Time: 12/14/15 10:39 PM  Result Value Ref Range Status   Specimen Description BLOOD RIGHT ANTECUBITAL  Final   Special Requests BOTTLES DRAWN AEROBIC AND ANAEROBIC 5ML  Final   Culture NO GROWTH 2 DAYS  Final   Report Status PENDING  Incomplete    Coagulation Studies: No results for input(s): LABPROT, INR in the last 72 hours.  Urinalysis:  Recent Labs  12/15/15 0048  COLORURINE YELLOW*  LABSPEC 1.008  PHURINE 9.0*  GLUCOSEU 150*  HGBUR NEGATIVE  BILIRUBINUR NEGATIVE  KETONESUR NEGATIVE  PROTEINUR >500*  NITRITE NEGATIVE  LEUKOCYTESUR NEGATIVE      Imaging: Ct Angio Chest Aorta W/cm &/or Wo/cm  12/17/2015  CLINICAL DATA:  Acute onset of neck pain, generalized chest pain, right abdominal pain and right leg pain. Shortness of breath and chest tightness. Patient on dialysis. Initial encounter. EXAM: CT ANGIOGRAPHY CHEST, ABDOMEN AND PELVIS TECHNIQUE: Multidetector CT imaging through the chest, abdomen and pelvis was performed using the standard protocol during bolus administration of intravenous contrast. Multiplanar reconstructed images and MIPs were obtained and reviewed to evaluate the vascular anatomy. CONTRAST:  100 mL of Isovue 370  IV contrast COMPARISON:  CT of the abdomen and pelvis performed 06/26/2013, and MRI of the lumbar spine performed 05/19/2010 FINDINGS: CTA CHEST FINDINGS There is no evidence of aortic dissection. There is no evidence of aneurysmal dilatation. Mild scattered calcification is noted along the aortic arch. Diffuse coronary artery calcifications are seen. There is no evidence of significant pulmonary embolus. Minimal bilateral peripheral atelectasis or scarring is noted. Mild emphysematous change is noted at the upper lung lobes. Mild bilateral lower lobe bronchiectasis is noted. The lungs are otherwise grossly clear. There is no evidence of significant focal consolidation, pleural effusion or pneumothorax. No masses are identified; no abnormal focal contrast enhancement is seen. Prominent right hilar nodes measure up to 1.2 cm in short axis. A 1.4 cm subcarinal node is noted. No additional mediastinal lymphadenopathy is seen. No pericardial effusion is identified. The great vessels are grossly unremarkable in appearance. No axillary lymphadenopathy is seen. Scattered cystic foci are noted within the thyroid gland, measuring up to 2.1 cm. No acute osseous abnormalities are seen. 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 Review of the MIP images confirms the above findings. CTA ABDOMEN AND PELVIS FINDINGS There is no evidence of aortic dissection. There is mild aneurysmal dilatation of the infrarenal abdominal aorta, measuring up to 3.3 cm in AP dimension, with mild associated mural thrombus and scattered calcification along the abdominal aorta and its branches. There is mild ectasia of the common iliac arteries bilaterally. The celiac trunk, superior mesenteric artery, bilateral renal arteries and inferior mesenteric artery appear grossly patent. Two left-sided renal arteries are noted. The inferior vena cava is grossly unremarkable in appearance, though not fully assessed given the phase of contrast  enhancement. The liver and spleen are unremarkable in appearance. The gallbladder is within normal limits. The pancreas and adrenal glands are unremarkable. Mild right renal scarring is noted, with a few small right renal cysts. There is no evidence of hydronephrosis. No renal or ureteral stones are seen. No perinephric stranding is appreciated. No free fluid is identified. The small bowel is unremarkable in appearance. The stomach is within normal limits. No acute vascular abnormalities are seen. A small umbilical hernia is noted, containing only fat. The appendix is normal in caliber and contains air, without evidence of appendicitis. Scattered diverticulosis is noted along the ascending, distal transverse, descending and sigmoid colon, without evidence of diverticulitis. The bladder is moderately distended and grossly unremarkable. The uterus is unremarkable in appearance. The ovaries are relatively symmetric. No suspicious adnexal masses are seen. No inguinal lymphadenopathy is seen. No acute osseous abnormalities are identified. Mild degenerative change is noted at both hips, more prominent on the right, with scattered subcortical cysts noted bilaterally. An associated loose body is noted at the right hip joint space. Mild sclerotic change is noted  at the sacroiliac joints. Chronic loss of height is noted at vertebral body L1. Mild facet disease noted at the lower lumbar spine. Review of the MIP images confirms the above findings. IMPRESSION: 1. No evidence of aortic dissection. 2. 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 Full mild ectasia of the common iliac arteries bilaterally. Recommend followup by ultrasound in 3 years. This recommendation follows ACR consensus guidelines: White Paper of the ACR Incidental Findings Committee II on Vascular Findings. J Am Coll Radiol 2013; [id number]  3. Diffuse coronary artery calcifications seen. Mild scattered calcification along the aortic arch. 4. No evidence of significant pulmonary embolus. 5. Minimal bilateral peripheral atelectasis or scarring noted. Mild emphysematous change at the upper lung lobes. Mild bilateral lower lobe bronchiectasis seen. Lungs otherwise clear. 6. Mild right renal scarring, with a few small right renal cysts. 7. Small umbilical hernia, containing only fat. 8. Scattered diverticulosis along the ascending, distal transverse, descending and sigmoid colon, without evidence of diverticulitis. 9. Mild degenerative change at both hips, more prominent on the right, with scattered subcortical cysts and an associated loose body at the right hip joint space. 10. Mild chronic loss of height at L1. Electronically Signed   By: Garald Balding M.D.   On: 12/17/2015 01:11   Ct Angio Abd/pel W/ And/or W/o  12/17/2015  CLINICAL DATA:  Acute onset of neck pain, generalized chest pain, right abdominal pain and right leg pain. Shortness of breath and chest tightness. Patient on dialysis. Initial encounter. EXAM: CT ANGIOGRAPHY CHEST, ABDOMEN AND PELVIS TECHNIQUE: Multidetector CT imaging through the chest, abdomen and pelvis was performed using the standard protocol during bolus administration of intravenous contrast. Multiplanar reconstructed images and MIPs were obtained and reviewed to evaluate the vascular anatomy. CONTRAST:  100 mL of Isovue 370 IV contrast COMPARISON:  CT of the abdomen and pelvis performed 06/26/2013, and MRI of the lumbar spine performed 05/19/2010 FINDINGS: CTA CHEST FINDINGS There is no evidence of aortic dissection. There is no evidence of aneurysmal dilatation. Mild scattered calcification is noted along the aortic arch. Diffuse coronary artery calcifications are seen. There is no evidence of significant pulmonary embolus. Minimal bilateral peripheral atelectasis or scarring is noted. Mild emphysematous change is noted  at the upper lung lobes. Mild bilateral lower lobe bronchiectasis is noted. The lungs are otherwise grossly clear. There is no evidence of significant focal consolidation, pleural effusion or pneumothorax. No masses are identified; no abnormal focal contrast enhancement is seen. Prominent right hilar nodes measure up to 1.2 cm in short axis. A 1.4 cm subcarinal node is noted. No additional mediastinal lymphadenopathy is seen. No pericardial effusion is identified. The great vessels are grossly unremarkable in appearance. No axillary lymphadenopathy is seen. Scattered cystic foci are noted within the thyroid gland, measuring up to 2.1 cm. No acute osseous abnormalities are seen. 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 Review of the MIP images confirms the above findings. CTA ABDOMEN AND PELVIS FINDINGS There is no evidence of aortic dissection. There is mild aneurysmal dilatation of the infrarenal abdominal aorta, measuring up to 3.3 cm in AP dimension, with mild associated mural thrombus and scattered calcification along the abdominal aorta and its branches. There is mild ectasia of the common iliac arteries bilaterally. The celiac trunk, superior mesenteric artery, bilateral renal arteries and inferior mesenteric artery appear grossly patent. Two left-sided renal arteries are noted. The  inferior vena cava is grossly unremarkable in appearance, though not fully assessed given the phase of contrast enhancement. The liver and spleen are unremarkable in appearance. The gallbladder is within normal limits. The pancreas and adrenal glands are unremarkable. Mild right renal scarring is noted, with a few small right renal cysts. There is no evidence of hydronephrosis. No renal or ureteral stones are seen. No perinephric stranding is appreciated. No free fluid is identified. The small bowel is unremarkable in appearance. The stomach is within normal limits. No acute vascular abnormalities are seen. A  small umbilical hernia is noted, containing only fat. The appendix is normal in caliber and contains air, without evidence of appendicitis. Scattered diverticulosis is noted along the ascending, distal transverse, descending and sigmoid colon, without evidence of diverticulitis. The bladder is moderately distended and grossly unremarkable. The uterus is unremarkable in appearance. The ovaries are relatively symmetric. No suspicious adnexal masses are seen. No inguinal lymphadenopathy is seen. No acute osseous abnormalities are identified. Mild degenerative change is noted at both hips, more prominent on the right, with scattered subcortical cysts noted bilaterally. An associated loose body is noted at the right hip joint space. Mild sclerotic change is noted at the sacroiliac joints. Chronic loss of height is noted at vertebral body L1. Mild facet disease noted at the lower lumbar spine. Review of the MIP images confirms the above findings. IMPRESSION: 1. No evidence of aortic dissection. 2. 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 Full mild ectasia of the common iliac arteries bilaterally. Recommend followup by ultrasound in 3 years. This recommendation follows ACR consensus guidelines: White Paper of the ACR Incidental Findings Committee II on Vascular Findings. J Am Coll Radiol 2013; [id number] 3. Diffuse coronary artery calcifications seen. Mild scattered calcification along the aortic arch. 4. No evidence of significant pulmonary embolus. 5. Minimal bilateral peripheral atelectasis or scarring noted. Mild emphysematous change at the upper lung lobes. Mild bilateral lower lobe bronchiectasis seen. Lungs otherwise clear. 6. Mild right renal scarring, with a few small right renal cysts. 7. Small umbilical hernia, containing only fat. 8. Scattered diverticulosis along the ascending, distal  transverse, descending and sigmoid colon, without evidence of diverticulitis. 9. Mild degenerative change at both hips, more prominent on the right, with scattered subcortical cysts and an associated loose body at the right hip joint space. 10. Mild chronic loss of height at L1. Electronically Signed   By: Garald Balding M.D.   On: 12/17/2015 01:11     Medications:     . amLODipine  10 mg Oral Daily  . aspirin  324 mg Oral NOW   Or  . aspirin  300 mg Rectal NOW  . aspirin  81 mg Oral Daily  . atorvastatin  80 mg Oral q1800  . cholecalciferol  5,000 Units Oral Daily  . DULoxetine  60 mg Oral Daily  . folic acid  1 mg Oral Daily  . heparin  5,000 Units Subcutaneous 3 times per day  . lubiprostone  24 mcg Oral Q breakfast  . metoprolol succinate  100 mg Oral Daily  . pantoprazole  40 mg Oral BID  . sodium bicarbonate  650 mg Oral BID  . sodium chloride flush  3 mL Intravenous Q12H   sodium chloride, acetaminophen, albuterol, HYDROcodone-acetaminophen, nitroGLYCERIN, ondansetron (ZOFRAN) IV, sodium chloride flush  Assessment/ Plan:  [name] is a 65 y.o. black female  with hypertension, coronary artery disease s/p CABG, peripheral vascular disease, diabetes mellitus type II, hyperlipidemia, bilateral total knee replacement  1. ESRD: MWF [place] next treatment for tomorrow.   2. Anemia of chronic kidney disease: hemoglobin 8.2 - epo with treatment  3. Secondary Hyperparathyroidism: with elevated PTH >1100. Phos at goal.   4. Hypertension: blood pressure at goal.  - amlodipine, metoprolol     LOS:  Kimberly Shaffer 3/30/201711:24 AM

## 2015-12-17 NOTE — Care Management (Addendum)
Patient is known HD on [facility].  Placed in observation for chest pain.  Notified dialysis coordinator of admisison

## 2015-12-17 NOTE — Progress Notes (Signed)
*  PRELIMINARY RESULTS* Echocardiogram 2D Echocardiogram has been performed.  Kimberly Shaffer 12/17/2015, 7:42 PM

## 2015-12-17 NOTE — H&P (Signed)
[facility] [place] NAME: Kimberly Shaffer    MR#:  [id number]  DATE OF BIRTH:  [birth year]  DATE OF ADMISSION:  12/16/2015  PRIMARY CARE PHYSICIAN: Pcp Not In System   REQUESTING/REFERRING PHYSICIAN:   CHIEF COMPLAINT:   Chief Complaint  Patient presents with  . Shortness of Breath    HISTORY OF PRESENT ILLNESS: Kimberly Shaffer  is a 65 y.o. female with a known history of End-stage renal disease on dialysis, COPD, hypertension, hyperlipidemia, coronary artery disease presented to the emergency room with chest pain and pain radiating from right side of the neck to the groin since yesterday. Pain was 6 out of 10 scale of 1-10. Pain is dull aching in nature. Patient was worked up with a CT aortogram which showed no aortic dissection. First set of troponin was negative and patient gets dialyzed on Monday witnessed and Friday. No history of fever or chills and cough. No history of headache dizziness blurry vision. No history of syncope or seizure. No history of hematemesis hemoptysis or rectal bleed. EKG showed T-wave abnormalities.  PAST MEDICAL HISTORY:   Past Medical History  Diagnosis Date  . Myocardial infarction ([place])   . COPD (chronic obstructive pulmonary disease) ([place])   . Hypertension   . Obesity   . Depression   . Hypercholesteremia   . H/O blood clots   . Stroke ([place])   . H/O angina pectoris   . CAD (coronary artery disease)   . Diabetes mellitus without complication (HCC)     NIDD  . Vertigo, aural   . Renal insufficiency   . Asthma     PAST SURGICAL HISTORY: Past Surgical History  Procedure Laterality Date  . Cardiac catheterization    . Coronary stent placement    . Knee surgery Bilateral   . Replacement total knee bilateral Bilateral   . Coronary artery bypass graft  2012  . Joint replacement    . Av fistula placement Left 11-04-2015  . Av fistula placement Left 11/04/2015    Procedure: ARTERIOVENOUS (AV) FISTULA  CREATION  ( BRACHIAL CEPHALIC );  Surgeon: Katha Cabal, MD;  Location: [facility];  Service: Vascular;  Laterality: Left;  . Peripheral vascular catheterization N/A 11/13/2015    Procedure: Dialysis/Perma Catheter Insertion;  Surgeon: Katha Cabal, MD;  Location: [facility];  Service: Cardiovascular;  Laterality: N/A;  . Peripheral vascular catheterization N/A 12/04/2015    Procedure: Dialysis/Perma Catheter Insertion;  Surgeon: Katha Cabal, MD;  Location: [facility];  Service: Cardiovascular;  Laterality: N/A;    SOCIAL HISTORY:  Social History  Substance Use Topics  . Smoking status: Current Every Day Smoker -- 0.50 packs/day    Types: Cigarettes  . Smokeless tobacco: Not on file  . Alcohol Use: No    FAMILY HISTORY:  Family History  Problem Relation Age of Onset  . Cancer Mother   . Cancer Father   . Diabetes Brother   . Heart disease Brother     DRUG ALLERGIES:  Allergies  Allergen Reactions  . Nystatin Hives and Itching  . Sulfa Antibiotics Swelling and Hives    REVIEW OF SYSTEMS:   CONSTITUTIONAL: No fever, fatigue or weakness.  EYES: No blurred or double vision.  EARS, NOSE, AND THROAT: No tinnitus or ear pain.  RESPIRATORY: No cough, shortness of breath, wheezing or hemoptysis.  CARDIOVASCULAR: Has chest pain, no orthopnea, edema.  GASTROINTESTINAL: No nausea, vomiting, diarrhea or  abdominal pain.  GENITOURINARY: No dysuria, hematuria.  ENDOCRINE: No polyuria, nocturia,  HEMATOLOGY: No anemia, easy bruising or bleeding SKIN: No rash or lesion. MUSCULOSKELETAL: No joint pain or arthritis.   NEUROLOGIC: No tingling, numbness, weakness.  PSYCHIATRY: No anxiety or depression.   MEDICATIONS AT HOME:  Prior to Admission medications   Medication Sig Start Date End Date Taking? Authorizing Provider  albuterol (PROVENTIL HFA;VENTOLIN HFA) 108 (90 BASE) MCG/ACT inhaler Inhale into the lungs every 4 (four) hours as needed for wheezing  or shortness of breath. 2-4 puffs    Historical Provider, MD  amLODipine (NORVASC) 10 MG tablet Take 10 mg by mouth daily.    Historical Provider, MD  aspirin 81 MG tablet Take 81 mg by mouth daily.    Historical Provider, MD  atorvastatin (LIPITOR) 80 MG tablet Take 80 mg by mouth daily.    Historical Provider, MD  calcium carbonate (OSCAL) 1500 (600 Ca) MG TABS tablet Take 600 mg of elemental calcium by mouth daily with breakfast.    Historical Provider, MD  Cholecalciferol (VITAMIN D3) 5000 UNITS CAPS Take 1 tablet by mouth daily.    Historical Provider, MD  DULoxetine (CYMBALTA) 60 MG capsule Take 60 mg by mouth daily.    Historical Provider, MD  FOLIC ACID PO Take 1 mg by mouth daily.     Historical Provider, MD  HYDROcodone-acetaminophen (NORCO) 5-325 MG tablet Take 1-2 tablets by mouth every 6 (six) hours as needed for moderate pain or severe pain. 11/04/15   Katha Cabal, MD  levofloxacin (LEVAQUIN) 500 MG tablet Take 1 tablet (500 mg total) by mouth once. 12/15/15   Gregor Hams, MD  lubiprostone (AMITIZA) 24 MCG capsule Take 24 mcg by mouth daily as needed for constipation.    Historical Provider, MD  meclizine (ANTIVERT) 25 MG tablet Take 25 mg by mouth 3 (three) times daily as needed for dizziness.    Historical Provider, MD  methocarbamol (ROBAXIN) 750 MG tablet Take 750 mg by mouth daily as needed for muscle spasms.    Historical Provider, MD  metoprolol succinate (TOPROL-XL) 100 MG 24 hr tablet Take 100 mg by mouth daily. Take with or immediately following a meal.    Historical Provider, MD  Multiple Vitamins-Minerals (ONE-A-DAY WOMENS 50 PLUS PO) Take 1 tablet by mouth daily.    Historical Provider, MD  nitroGLYCERIN (NITROLINGUAL) 0.4 MG/SPRAY spray Place 1 spray under the tongue every 5 (five) minutes x 3 doses as needed for chest pain.    Historical Provider, MD  Omega-3 Fatty Acids (FISH OIL) 1000 MG CPDR Take by mouth once.    Historical Provider, MD  Oxycodone HCl 10 MG  TABS Limit  1  tab po bid - qid if tolerated daily 12/01/15   Mohammed Kindle, MD  pantoprazole (PROTONIX) 40 MG tablet Take 40 mg by mouth 2 (two) times daily.    Historical Provider, MD  Pramoxine-Dimethicone 1-6 % CREA Apply topically.    Historical Provider, MD  sodium bicarbonate 650 MG tablet Take 650 mg by mouth 2 (two) times daily.    Historical Provider, MD      PHYSICAL EXAMINATION:   VITAL SIGNS: Blood pressure 154/88, pulse 81, temperature 99.4 F (37.4 C), temperature source Oral, resp. rate 27, height 5\' 6"  (1.676 m), weight 108 kg (238 lb 1.6 oz), SpO2 99 %.  GENERAL:  65 y.o.-year-old patient lying in the bed with no acute distress.  EYES: Pupils equal, round, reactive to light and accommodation. No  scleral icterus. Extraocular muscles intact.  HEENT: Head atraumatic, normocephalic. Oropharynx and nasopharynx clear.  NECK:  Supple, no jugular venous distention. No thyroid enlargement, no tenderness.  LUNGS: Normal breath sounds bilaterally, no wheezing, rales,rhonchi or crepitation. No use of accessory muscles of respiration.  CARDIOVASCULAR: S1, S2 normal. No murmurs, rubs, or gallops.  ABDOMEN: Soft, nontender, nondistended. Bowel sounds present. No organomegaly or mass.  EXTREMITIES: No pedal edema, cyanosis, or clubbing.  NEUROLOGIC: Cranial nerves II through XII are intact. Muscle strength 5/5 in all extremities. Sensation intact. Gait normal. PSYCHIATRIC: The patient is alert and oriented x 3.  SKIN: No obvious rash, lesion, or ulcer.   LABORATORY PANEL:   CBC  Recent Labs Lab 12/14/15 1953 12/16/15 2254  WBC 6.4 5.0  HGB 8.1* 8.5*  HCT 25.1* 26.8*  PLT 191 180  MCV 87.4 86.0  MCH 28.1 27.4  MCHC 32.1 31.9*  RDW 16.3* 16.1*  LYMPHSABS 0.6*  --   MONOABS 0.9  --   EOSABS 0.1  --   BASOSABS 0.0  --    ------------------------------------------------------------------------------------------------------------------  Chemistries   Recent Labs Lab  12/14/15 1953 12/16/15 2254  NA 135 135  K 2.8* 2.8*  CL 96* 95*  CO2 29 31  GLUCOSE 134* 99  BUN 13 10  CREATININE 2.63* 2.67*  CALCIUM 8.3* 8.5*  AST 22  --   ALT 12*  --   ALKPHOS 80  --   BILITOT 0.4  --    ------------------------------------------------------------------------------------------------------------------ estimated creatinine clearance is 26.5 mL/min (by C-G formula based on Cr of 2.67). ------------------------------------------------------------------------------------------------------------------ No results for input(s): TSH, T4TOTAL, T3FREE, THYROIDAB in the last 72 hours.  Invalid input(s): FREET3   Coagulation profile No results for input(s): INR, PROTIME in the last 168 hours. ------------------------------------------------------------------------------------------------------------------- No results for input(s): DDIMER in the last 72 hours. -------------------------------------------------------------------------------------------------------------------  Cardiac Enzymes  Recent Labs Lab 12/14/15 1953 12/16/15 2254  TROPONINI <0.03 0.03   ------------------------------------------------------------------------------------------------------------------ Invalid input(s): POCBNP  ---------------------------------------------------------------------------------------------------------------  Urinalysis    Component Value Date/Time   COLORURINE YELLOW* 12/15/2015 0048   COLORURINE Yellow 07/21/2013 0807   APPEARANCEUR CLEAR* 12/15/2015 0048   APPEARANCEUR Clear 07/21/2013 0807   LABSPEC 1.008 12/15/2015 0048   LABSPEC 1.013 07/21/2013 0807   PHURINE 9.0* 12/15/2015 0048   PHURINE 5.0 07/21/2013 0807   GLUCOSEU 150* 12/15/2015 0048   GLUCOSEU Negative 07/21/2013 0807   HGBUR NEGATIVE 12/15/2015 0048   HGBUR Negative 07/21/2013 0807   BILIRUBINUR NEGATIVE 12/15/2015 0048   BILIRUBINUR Negative 07/21/2013 0807   KETONESUR NEGATIVE  12/15/2015 0048   KETONESUR Negative 07/21/2013 0807   PROTEINUR >500* 12/15/2015 0048   PROTEINUR 100 mg/dL 07/21/2013 0807   NITRITE NEGATIVE 12/15/2015 0048   NITRITE Negative 07/21/2013 0807   LEUKOCYTESUR NEGATIVE 12/15/2015 0048   LEUKOCYTESUR Negative 07/21/2013 0807     RADIOLOGY: Ct Angio Chest Aorta W/cm &/or Wo/cm  12/17/2015  CLINICAL DATA:  Acute onset of neck pain, generalized chest pain, right abdominal pain and right leg pain. Shortness of breath and chest tightness. Patient on dialysis. Initial encounter. EXAM: CT ANGIOGRAPHY CHEST, ABDOMEN AND PELVIS TECHNIQUE: Multidetector CT imaging through the chest, abdomen and pelvis was performed using the standard protocol during bolus administration of intravenous contrast. Multiplanar reconstructed images and MIPs were obtained and reviewed to evaluate the vascular anatomy. CONTRAST:  100 mL of Isovue 370 IV contrast COMPARISON:  CT of the abdomen and pelvis performed 06/26/2013, and MRI of the lumbar spine performed 05/19/2010 FINDINGS: CTA CHEST FINDINGS There is  no evidence of aortic dissection. There is no evidence of aneurysmal dilatation. Mild scattered calcification is noted along the aortic arch. Diffuse coronary artery calcifications are seen. There is no evidence of significant pulmonary embolus. Minimal bilateral peripheral atelectasis or scarring is noted. Mild emphysematous change is noted at the upper lung lobes. Mild bilateral lower lobe bronchiectasis is noted. The lungs are otherwise grossly clear. There is no evidence of significant focal consolidation, pleural effusion or pneumothorax. No masses are identified; no abnormal focal contrast enhancement is seen. Prominent right hilar nodes measure up to 1.2 cm in short axis. A 1.4 cm subcarinal node is noted. No additional mediastinal lymphadenopathy is seen. No pericardial effusion is identified. The great vessels are grossly unremarkable in appearance. No axillary  lymphadenopathy is seen. Scattered cystic foci are noted within the thyroid gland, measuring up to 2.1 cm. No acute osseous abnormalities are seen. 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 Review of the MIP images confirms the above findings. CTA ABDOMEN AND PELVIS FINDINGS There is no evidence of aortic dissection. There is mild aneurysmal dilatation of the infrarenal abdominal aorta, measuring up to 3.3 cm in AP dimension, with mild associated mural thrombus and scattered calcification along the abdominal aorta and its branches. There is mild ectasia of the common iliac arteries bilaterally. The celiac trunk, superior mesenteric artery, bilateral renal arteries and inferior mesenteric artery appear grossly patent. Two left-sided renal arteries are noted. The inferior vena cava is grossly unremarkable in appearance, though not fully assessed given the phase of contrast enhancement. The liver and spleen are unremarkable in appearance. The gallbladder is within normal limits. The pancreas and adrenal glands are unremarkable. Mild right renal scarring is noted, with a few small right renal cysts. There is no evidence of hydronephrosis. No renal or ureteral stones are seen. No perinephric stranding is appreciated. No free fluid is identified. The small bowel is unremarkable in appearance. The stomach is within normal limits. No acute vascular abnormalities are seen. A small umbilical hernia is noted, containing only fat. The appendix is normal in caliber and contains air, without evidence of appendicitis. Scattered diverticulosis is noted along the ascending, distal transverse, descending and sigmoid colon, without evidence of diverticulitis. The bladder is moderately distended and grossly unremarkable. The uterus is unremarkable in appearance. The ovaries are relatively symmetric. No suspicious adnexal masses are seen. No inguinal lymphadenopathy is seen. No acute osseous abnormalities are  identified. Mild degenerative change is noted at both hips, more prominent on the right, with scattered subcortical cysts noted bilaterally. An associated loose body is noted at the right hip joint space. Mild sclerotic change is noted at the sacroiliac joints. Chronic loss of height is noted at vertebral body L1. Mild facet disease noted at the lower lumbar spine. Review of the MIP images confirms the above findings. IMPRESSION: 1. No evidence of aortic dissection. 2. 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 Full mild ectasia of the common iliac arteries bilaterally. Recommend followup by ultrasound in 3 years. This recommendation follows ACR consensus guidelines: White Paper of the ACR Incidental Findings Committee II on Vascular Findings. J Am Coll Radiol 2013; [id number] 3. Diffuse coronary artery calcifications seen. Mild scattered calcification along the aortic arch. 4. No evidence of significant pulmonary embolus. 5. Minimal bilateral peripheral atelectasis or scarring noted. Mild emphysematous change at the upper lung lobes. Mild bilateral lower lobe  bronchiectasis seen. Lungs otherwise clear. 6. Mild right renal scarring, with a few small right renal cysts. 7. Small umbilical hernia, containing only fat. 8. Scattered diverticulosis along the ascending, distal transverse, descending and sigmoid colon, without evidence of diverticulitis. 9. Mild degenerative change at both hips, more prominent on the right, with scattered subcortical cysts and an associated loose body at the right hip joint space. 10. Mild chronic loss of height at L1. Electronically Signed   By: Garald Balding M.D.   On: 12/17/2015 01:11   Ct Angio Abd/pel W/ And/or W/o  12/17/2015  CLINICAL DATA:  Acute onset of neck pain, generalized chest pain, right abdominal pain and right leg pain. Shortness of breath and  chest tightness. Patient on dialysis. Initial encounter. EXAM: CT ANGIOGRAPHY CHEST, ABDOMEN AND PELVIS TECHNIQUE: Multidetector CT imaging through the chest, abdomen and pelvis was performed using the standard protocol during bolus administration of intravenous contrast. Multiplanar reconstructed images and MIPs were obtained and reviewed to evaluate the vascular anatomy. CONTRAST:  100 mL of Isovue 370 IV contrast COMPARISON:  CT of the abdomen and pelvis performed 06/26/2013, and MRI of the lumbar spine performed 05/19/2010 FINDINGS: CTA CHEST FINDINGS There is no evidence of aortic dissection. There is no evidence of aneurysmal dilatation. Mild scattered calcification is noted along the aortic arch. Diffuse coronary artery calcifications are seen. There is no evidence of significant pulmonary embolus. Minimal bilateral peripheral atelectasis or scarring is noted. Mild emphysematous change is noted at the upper lung lobes. Mild bilateral lower lobe bronchiectasis is noted. The lungs are otherwise grossly clear. There is no evidence of significant focal consolidation, pleural effusion or pneumothorax. No masses are identified; no abnormal focal contrast enhancement is seen. Prominent right hilar nodes measure up to 1.2 cm in short axis. A 1.4 cm subcarinal node is noted. No additional mediastinal lymphadenopathy is seen. No pericardial effusion is identified. The great vessels are grossly unremarkable in appearance. No axillary lymphadenopathy is seen. Scattered cystic foci are noted within the thyroid gland, measuring up to 2.1 cm. No acute osseous abnormalities are seen. 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 Review of the MIP images confirms the above findings. CTA ABDOMEN AND PELVIS FINDINGS There is no evidence of aortic dissection. There is mild aneurysmal dilatation of the infrarenal abdominal aorta, measuring up to 3.3 cm in AP dimension, with mild associated mural thrombus and  scattered calcification along the abdominal aorta and its branches. There is mild ectasia of the common iliac arteries bilaterally. The celiac trunk, superior mesenteric artery, bilateral renal arteries and inferior mesenteric artery appear grossly patent. Two left-sided renal arteries are noted. The inferior vena cava is grossly unremarkable in appearance, though not fully assessed given the phase of contrast enhancement. The liver and spleen are unremarkable in appearance. The gallbladder is within normal limits. The pancreas and adrenal glands are unremarkable. Mild right renal scarring is noted, with a few small right renal cysts. There is no evidence of hydronephrosis. No renal or ureteral stones are seen. No perinephric stranding is appreciated. No free fluid is identified. The small bowel is unremarkable in appearance. The stomach is within normal limits. No acute vascular abnormalities are seen. A small umbilical hernia is noted, containing only fat. The appendix is normal in caliber and contains air, without evidence of appendicitis. Scattered diverticulosis is noted along the ascending, distal transverse, descending and sigmoid colon, without evidence of diverticulitis. The bladder is moderately distended and grossly unremarkable. The uterus is  unremarkable in appearance. The ovaries are relatively symmetric. No suspicious adnexal masses are seen. No inguinal lymphadenopathy is seen. No acute osseous abnormalities are identified. Mild degenerative change is noted at both hips, more prominent on the right, with scattered subcortical cysts noted bilaterally. An associated loose body is noted at the right hip joint space. Mild sclerotic change is noted at the sacroiliac joints. Chronic loss of height is noted at vertebral body L1. Mild facet disease noted at the lower lumbar spine. Review of the MIP images confirms the above findings. IMPRESSION: 1. No evidence of aortic dissection. 2. Mild aneurysmal  dilatation of the infrarenal abdominal aorta, measuring up to 3.3 cm in AP dimension, with mild associated mural thrombus and scattered calcification. Full mild ectasia of the common iliac arteries bilaterally. Recommend followup by ultrasound in 3 years. This recommendation follows ACR consensus guidelines: White Paper of the ACR Incidental Findings Committee II on Vascular Findings. J Am Coll Radiol 2013; [id number] 3. Diffuse coronary artery calcifications seen. Mild scattered calcification along the aortic arch. 4. No evidence of significant pulmonary embolus. 5. Minimal bilateral peripheral atelectasis or scarring noted. Mild emphysematous change at the upper lung lobes. Mild bilateral lower lobe bronchiectasis seen. Lungs otherwise clear. 6. Mild right renal scarring, with a few small right renal cysts. 7. Small umbilical hernia, containing only fat. 8. Scattered diverticulosis along the ascending, distal transverse, descending and sigmoid colon, without evidence of diverticulitis. 9. Mild degenerative change at both hips, more prominent on the right, with scattered subcortical cysts and an associated loose body at the right hip joint space. 10. Mild chronic loss of height at L1. Electronically Signed   By: Garald Balding M.D.   On: 12/17/2015 01:11    EKG: Orders placed or performed during the hospital encounter of 12/16/15  . ED EKG  . ED EKG  . EKG 12-Lead  . EKG 12-Lead    IMPRESSION AND PLAN: 65 year old female patient with history of end-stage renal disease on dialysis, COPD, hypertension, hyperlipidemia presented with chest pain. Admitting diagnosis 1. Chest pain rule out MI 2. Hypokalemia 3. End-stage renal failure on dialysis 4. Hypertension Treatment plan Admit patient to telemetry Aspirin 81 mg daily Check troponin to rule out ischemia Echocardiogram Nephrology consultation for dialysis Cardiology consultation for chest pain DVT prophylaxis with subcutaneous heparin  All  the records are reviewed and case discussed with ED provider. Management plans discussed with the patient, family and they are in agreement.  CODE STATUS:FULL Code Status History    This patient does not have a recorded code status. Please follow your organizational policy for patients in this situation.       TOTAL TIME TAKING CARE OF THIS PATIENT: 50 minutes.    Saundra Shelling M.D on 12/17/2015 at 3:43 AM  Between 7am to 6pm - Pager - [phone]  After 6pm go to [email or web address] - password [email or web address] [facility]  Office  [phone]  CC: Primary care physician; Pcp Not In System

## 2015-12-17 NOTE — Progress Notes (Signed)
A & O. Ambulating in the room and tolerated it well. NSR. Takes meds ok. Pt reports no pain. Pt reported back pain and k pad was added. Pt has no further concerns at at this time.

## 2015-12-17 NOTE — Progress Notes (Signed)
[place] [facility] [place] NAME: Kimberly Shaffer    MR#:  [id number]  DATE OF BIRTH:  16-May-1951  SUBJECTIVE:  CHIEF COMPLAINT:   Chief Complaint  Patient presents with  . Shortness of Breath   - admitted with right sided neck pain and chest pain - diarrhea twice today, generalized body aches and low grade fevers - troponins stable  REVIEW OF SYSTEMS:  Review of Systems  Constitutional: Positive for fever and malaise/fatigue. Negative for chills.  HENT: Negative for ear discharge, ear pain and nosebleeds.   Eyes: Negative for blurred vision.  Respiratory: Positive for shortness of breath. Negative for cough and wheezing.   Cardiovascular: Positive for chest pain. Negative for palpitations and leg swelling.       Right sided neck pain and chest pain  Gastrointestinal: Positive for nausea and diarrhea. Negative for vomiting, abdominal pain and constipation.  Genitourinary: Negative for dysuria.  Musculoskeletal: Positive for myalgias and neck pain.  Neurological: Negative for dizziness, tingling, speech change, focal weakness, seizures and headaches.  Psychiatric/Behavioral: Negative for depression.    DRUG ALLERGIES:   Allergies  Allergen Reactions  . Nystatin Hives and Itching  . Sulfa Antibiotics Swelling and Hives    VITALS:  Blood pressure 118/48, pulse 78, temperature 100.4 F (38 C), temperature source Oral, resp. rate 20, height 5\' 6"  (1.676 m), weight 105.325 kg (232 lb 3.2 oz), SpO2 78 %.  PHYSICAL EXAMINATION:  Physical Exam  GENERAL:  65 y.o.-year-old patient lying in the bed with no acute distress.  EYES: Pupils equal, round, reactive to light and accommodation. No scleral icterus. Extraocular muscles intact.  HEENT: Head atraumatic, normocephalic. Oropharynx and nasopharynx clear.  NECK:  Supple, no jugular venous distention. No thyroid enlargement, no tenderness.  LUNGS: Normal breath sounds bilaterally, no  wheezing, rales,rhonchi or crepitation. No use of accessory muscles of respiration.fine bibasilar rhonchi noted.  CARDIOVASCULAR: S1, S2 normal. No  rubs, or gallops. 2/6 systolic murmur present, right chest permacath noted. ABDOMEN: Soft, nontender, nondistended. Bowel sounds present. No organomegaly or mass.  EXTREMITIES: No pedal edema, cyanosis, or clubbing.  NEUROLOGIC: Cranial nerves II through XII are intact. Muscle strength 5/5 in all extremities. Sensation intact. Gait not checked.  PSYCHIATRIC: The patient is alert and oriented x 3.  SKIN: No obvious rash, lesion, or ulcer.    LABORATORY PANEL:   CBC  Recent Labs Lab 12/17/15 0821  WBC 4.0  HGB 8.2*  HCT 25.8*  PLT 161   ------------------------------------------------------------------------------------------------------------------  Chemistries   Recent Labs Lab 12/14/15 1953  12/17/15 0821  NA 135  < > 133*  K 2.8*  < > 3.3*  CL 96*  < > 97*  CO2 29  < > 29  GLUCOSE 134*  < > 99  BUN 13  < > 13  CREATININE 2.63*  < > 3.57*  CALCIUM 8.3*  < > 8.2*  AST 22  --   --   ALT 12*  --   --   ALKPHOS 80  --   --   BILITOT 0.4  --   --   < > = values in this interval not displayed. ------------------------------------------------------------------------------------------------------------------  Cardiac Enzymes  Recent Labs Lab 12/17/15 1409  TROPONINI 0.09*   ------------------------------------------------------------------------------------------------------------------  RADIOLOGY:  Ct Angio Chest Aorta W/cm &/or Wo/cm  12/17/2015  CLINICAL DATA:  Acute onset of neck pain, generalized chest pain, right abdominal pain and right leg pain. Shortness of breath and chest tightness.  Patient on dialysis. Initial encounter. EXAM: CT ANGIOGRAPHY CHEST, ABDOMEN AND PELVIS TECHNIQUE: Multidetector CT imaging through the chest, abdomen and pelvis was performed using the standard protocol during bolus administration of  intravenous contrast. Multiplanar reconstructed images and MIPs were obtained and reviewed to evaluate the vascular anatomy. CONTRAST:  100 mL of Isovue 370 IV contrast COMPARISON:  CT of the abdomen and pelvis performed 06/26/2013, and MRI of the lumbar spine performed 05/19/2010 FINDINGS: CTA CHEST FINDINGS There is no evidence of aortic dissection. There is no evidence of aneurysmal dilatation. Mild scattered calcification is noted along the aortic arch. Diffuse coronary artery calcifications are seen. There is no evidence of significant pulmonary embolus. Minimal bilateral peripheral atelectasis or scarring is noted. Mild emphysematous change is noted at the upper lung lobes. Mild bilateral lower lobe bronchiectasis is noted. The lungs are otherwise grossly clear. There is no evidence of significant focal consolidation, pleural effusion or pneumothorax. No masses are identified; no abnormal focal contrast enhancement is seen. Prominent right hilar nodes measure up to 1.2 cm in short axis. A 1.4 cm subcarinal node is noted. No additional mediastinal lymphadenopathy is seen. No pericardial effusion is identified. The great vessels are grossly unremarkable in appearance. No axillary lymphadenopathy is seen. Scattered cystic foci are noted within the thyroid gland, measuring up to 2.1 cm. No acute osseous abnormalities are seen. 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 Review of the MIP images confirms the above findings. CTA ABDOMEN AND PELVIS FINDINGS There is no evidence of aortic dissection. There is mild aneurysmal dilatation of the infrarenal abdominal aorta, measuring up to 3.3 cm in AP dimension, with mild associated mural thrombus and scattered calcification along the abdominal aorta and its branches. There is mild ectasia of the common iliac arteries bilaterally. The celiac trunk, superior mesenteric artery, bilateral renal arteries and inferior mesenteric artery appear grossly patent.  Two left-sided renal arteries are noted. The inferior vena cava is grossly unremarkable in appearance, though not fully assessed given the phase of contrast enhancement. The liver and spleen are unremarkable in appearance. The gallbladder is within normal limits. The pancreas and adrenal glands are unremarkable. Mild right renal scarring is noted, with a few small right renal cysts. There is no evidence of hydronephrosis. No renal or ureteral stones are seen. No perinephric stranding is appreciated. No free fluid is identified. The small bowel is unremarkable in appearance. The stomach is within normal limits. No acute vascular abnormalities are seen. A small umbilical hernia is noted, containing only fat. The appendix is normal in caliber and contains air, without evidence of appendicitis. Scattered diverticulosis is noted along the ascending, distal transverse, descending and sigmoid colon, without evidence of diverticulitis. The bladder is moderately distended and grossly unremarkable. The uterus is unremarkable in appearance. The ovaries are relatively symmetric. No suspicious adnexal masses are seen. No inguinal lymphadenopathy is seen. No acute osseous abnormalities are identified. Mild degenerative change is noted at both hips, more prominent on the right, with scattered subcortical cysts noted bilaterally. An associated loose body is noted at the right hip joint space. Mild sclerotic change is noted at the sacroiliac joints. Chronic loss of height is noted at vertebral body L1. Mild facet disease noted at the lower lumbar spine. Review of the MIP images confirms the above findings. IMPRESSION: 1. No evidence of aortic dissection. 2. 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 Full mild ectasia of the common iliac arteries bilaterally. Recommend followup by ultrasound in 3 years. This  recommendation follows ACR consensus guidelines: White Paper of the ACR Incidental Findings Committee II on Vascular Findings. J Am Coll Radiol 2013; [id number] 3. Diffuse coronary artery calcifications seen. Mild scattered calcification along the aortic arch. 4. No evidence of significant pulmonary embolus. 5. Minimal bilateral peripheral atelectasis or scarring noted. Mild emphysematous change at the upper lung lobes. Mild bilateral lower lobe bronchiectasis seen. Lungs otherwise clear. 6. Mild right renal scarring, with a few small right renal cysts. 7. Small umbilical hernia, containing only fat. 8. Scattered diverticulosis along the ascending, distal transverse, descending and sigmoid colon, without evidence of diverticulitis. 9. Mild degenerative change at both hips, more prominent on the right, with scattered subcortical cysts and an associated loose body at the right hip joint space. 10. Mild chronic loss of height at L1. Electronically Signed   By: Garald Balding M.D.   On: 12/17/2015 01:11   Ct Angio Abd/pel W/ And/or W/o  12/17/2015  CLINICAL DATA:  Acute onset of neck pain, generalized chest pain, right abdominal pain and right leg pain. Shortness of breath and chest tightness. Patient on dialysis. Initial encounter. EXAM: CT ANGIOGRAPHY CHEST, ABDOMEN AND PELVIS TECHNIQUE: Multidetector CT imaging through the chest, abdomen and pelvis was performed using the standard protocol during bolus administration of intravenous contrast. Multiplanar reconstructed images and MIPs were obtained and reviewed to evaluate the vascular anatomy. CONTRAST:  100 mL of Isovue 370 IV contrast COMPARISON:  CT of the abdomen and pelvis performed 06/26/2013, and MRI of the lumbar spine performed 05/19/2010 FINDINGS: CTA CHEST FINDINGS There is no evidence of aortic dissection. There is no evidence of aneurysmal dilatation. Mild scattered calcification is noted along the aortic arch. Diffuse coronary artery calcifications  are seen. There is no evidence of significant pulmonary embolus. Minimal bilateral peripheral atelectasis or scarring is noted. Mild emphysematous change is noted at the upper lung lobes. Mild bilateral lower lobe bronchiectasis is noted. The lungs are otherwise grossly clear. There is no evidence of significant focal consolidation, pleural effusion or pneumothorax. No masses are identified; no abnormal focal contrast enhancement is seen. Prominent right hilar nodes measure up to 1.2 cm in short axis. A 1.4 cm subcarinal node is noted. No additional mediastinal lymphadenopathy is seen. No pericardial effusion is identified. The great vessels are grossly unremarkable in appearance. No axillary lymphadenopathy is seen. Scattered cystic foci are noted within the thyroid gland, measuring up to 2.1 cm. No acute osseous abnormalities are seen. 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 Review of the MIP images confirms the above findings. CTA ABDOMEN AND PELVIS FINDINGS There is no evidence of aortic dissection. There is mild aneurysmal dilatation of the infrarenal abdominal aorta, measuring up to 3.3 cm in AP dimension, with mild associated mural thrombus and scattered calcification along the abdominal aorta and its branches. There is mild ectasia of the common iliac arteries bilaterally. The celiac trunk, superior mesenteric artery, bilateral renal arteries and inferior mesenteric artery appear grossly patent. Two left-sided renal arteries are noted. The inferior vena cava is grossly unremarkable in appearance, though not fully assessed given the phase of contrast enhancement. The liver and spleen are unremarkable in appearance. The gallbladder is within normal limits. The pancreas and adrenal glands are unremarkable. Mild right renal scarring is noted, with a few small right renal cysts. There is no evidence of hydronephrosis. No renal or ureteral stones  are seen. No perinephric stranding is  appreciated. No free fluid is identified. The small bowel is unremarkable in appearance. The stomach is within normal limits. No acute vascular abnormalities are seen. A small umbilical hernia is noted, containing only fat. The appendix is normal in caliber and contains air, without evidence of appendicitis. Scattered diverticulosis is noted along the ascending, distal transverse, descending and sigmoid colon, without evidence of diverticulitis. The bladder is moderately distended and grossly unremarkable. The uterus is unremarkable in appearance. The ovaries are relatively symmetric. No suspicious adnexal masses are seen. No inguinal lymphadenopathy is seen. No acute osseous abnormalities are identified. Mild degenerative change is noted at both hips, more prominent on the right, with scattered subcortical cysts noted bilaterally. An associated loose body is noted at the right hip joint space. Mild sclerotic change is noted at the sacroiliac joints. Chronic loss of height is noted at vertebral body L1. Mild facet disease noted at the lower lumbar spine. Review of the MIP images confirms the above findings. IMPRESSION: 1. No evidence of aortic dissection. 2. 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 Full mild ectasia of the common iliac arteries bilaterally. Recommend followup by ultrasound in 3 years. This recommendation follows ACR consensus guidelines: White Paper of the ACR Incidental Findings Committee II on Vascular Findings. J Am Coll Radiol 2013; [id number] 3. Diffuse coronary artery calcifications seen. Mild scattered calcification along the aortic arch. 4. No evidence of significant pulmonary embolus. 5. Minimal bilateral peripheral atelectasis or scarring noted. Mild emphysematous change at the upper lung lobes. Mild bilateral lower lobe bronchiectasis seen. Lungs otherwise clear.  6. Mild right renal scarring, with a few small right renal cysts. 7. Small umbilical hernia, containing only fat. 8. Scattered diverticulosis along the ascending, distal transverse, descending and sigmoid colon, without evidence of diverticulitis. 9. Mild degenerative change at both hips, more prominent on the right, with scattered subcortical cysts and an associated loose body at the right hip joint space. 10. Mild chronic loss of height at L1. Electronically Signed   By: Garald Balding M.D.   On: 12/17/2015 01:11    EKG:   Orders placed or performed during the hospital encounter of 12/16/15  . ED EKG  . ED EKG  . EKG 12-Lead  . EKG 12-Lead    ASSESSMENT AND PLAN:   65 year old female with past medical history significant for end-stage renal disease on Monday Wednesday Friday hemodialysis, hypertension, COPD not on home oxygen, CAD presents to the hospital secondary to right-sided neck pain and chest pain.  #1 Right sided neck Pain and chest pain-right-sided. Musculoskeletal likely. -Troponins are elevated but stable now. -Appreciate cardiology consult. Due to diffuse coronary calcifications noted on the CT chest, will benefit from a stress test either as inpatient or outpatient. -Follow up echocardiogram. -CT chest with no pulmonary embolism, no aortic dissection. -Emphysematous and bronchiectatic changes noted - add muscle relaxants  #2 Fever- could be acute bronchitis, influenza test negative - URI symptoms noted - nebs prn and add levaquin  #3 Diarrhea- 2 episodes for now, if continues- then send stool studies Monitor closely  #4 end-stage renal disease on Monday Wednesday Friday hemodialysis- -Nephrology consulted. -For dialysis tomorrow per schedule  #5 anemia of chronic disease-monitor hemoglobin. Stable at this time. -No acute indication for transfusion  #6 HTN- metoprolol  Ambulatory in hospital. Likely no PT needs   All the records are  reviewed and case discussed  with Care Management/Social Workerr. Management plans discussed with the patient, family and they are in agreement.  CODE STATUS: Full Code  TOTAL TIME TAKING CARE OF THIS PATIENT: 39 minutes.   POSSIBLE D/C IN 2-3 DAYS, DEPENDING ON CLINICAL CONDITION.   Gladstone Lighter M.D on 12/17/2015 at 4:25 PM  Between 7am to 6pm - Pager - [phone]  After 6pm go to [email or web address] - password [email or web address] [facility]  Office  [phone]  CC: Primary care physician; Pcp Not In System

## 2015-12-17 NOTE — ED Provider Notes (Addendum)
-----------------------------------------   2:51 AM on 12/17/2015 -----------------------------------------  CTA chest, abdomen/pelvis interpreted per Dr. Radene Knee: 1. No evidence of aortic dissection. 2. 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 Full mild ectasia of the common iliac arteries bilaterally. Recommend followup by ultrasound in 3 years. This recommendation follows ACR consensus guidelines: White Paper of the ACR Incidental Findings Committee II on Vascular Findings. J Am Coll Radiol 2013; [id number] 3. Diffuse coronary artery calcifications seen. Mild scattered calcification along the aortic arch. 4. No evidence of significant pulmonary embolus. 5. Minimal bilateral peripheral atelectasis or scarring noted. Mild emphysematous change at the upper lung lobes. Mild bilateral lower lobe bronchiectasis seen. Lungs otherwise clear. 6. Mild right renal scarring, with a few small right renal cysts. 7. Small umbilical hernia, containing only fat. 8. Scattered diverticulosis along the ascending, distal transverse, descending and sigmoid colon, without evidence of diverticulitis. 9. Mild degenerative change at both hips, more prominent on the right, with scattered subcortical cysts and an associated loose body at the right hip joint space. 10. Mild chronic loss of height at L1.  Updated patient and her daughter of CT imaging results. I compared patient's current EKG to a recent one dated 12/14/15. There are new ST depression in lead III tonight compared to prior dated several days ago. Given EKG changes, coupled with patient's complaints of chest pain, I have discussed with the hospitalist to evaluate patient in the emergency department for admission.  Paulette Blanch, MD 12/17/15 [phone]  ----------------------------------------- 7:13 AM on  12/29/2015 -----------------------------------------  Addendum: ED ECG REPORT I, SUNG,JADE J, the attending physician, personally viewed and interpreted this ECG.   Date: 12/17/2015  EKG Time: 0041  Rate: 91  Rhythm: normal EKG, normal sinus rhythm  Axis: LAD  Intervals:left anterior fascicular block  ST&T Change: STdepression lead III   Paulette Blanch, MD 12/29/15 [phone]

## 2015-12-17 NOTE — Care Management Obs Status (Signed)
[facility] NOTIFICATION   Patient Details  Name: Kimberly Shaffer MRN: [id number] Date of Birth: [birth year]   Medicare Observation Status Notification Given:  Yes reviewed with patient, signed and sent to HIM    Beverly Sessions, RN 12/17/2015, 1:47 PM

## 2015-12-17 NOTE — ED Notes (Signed)
Pt transported to 2A

## 2015-12-17 NOTE — ED Notes (Signed)
Waiting on pt to return from CT for EKG

## 2015-12-17 NOTE — H&P (Signed)
[facility] Cardiology  CARDIOLOGY CONSULT NOTE  Patient ID: Kimberly Shaffer MRN: [id number] DOB/AGE: 65-21-1952 65 y.o.  Admit date: 12/16/2015 Referring Physician Tressia Miners Primary Physician  Primary Cardiologist Nehemiah Massed Reason for Consultation chest pain  HPI: 65 year old female referred for evaluation of chest pain. The patient has known coronary artery disease status post multiple coronary stents. She also has end-stage renal disease on chronic hemodialysis. Yesterday, the patient was undergoing dialysis, complained of chest pain described as pain and pressure in her right side of her neck. The patient was brought to [facility] emergency room where ECG was unremarkable. CT scan was performed which ruled out aortic dissection. Admission labs were notable for negative troponin. Patient is currently chest pain-free.  Review of systems complete and found to be negative unless listed above     Past Medical History  Diagnosis Date  . Myocardial infarction ([place])   . COPD (chronic obstructive pulmonary disease) ([place])   . Hypertension   . Obesity   . Depression   . Hypercholesteremia   . H/O blood clots   . Stroke ([place])   . H/O angina pectoris   . CAD (coronary artery disease)   . Diabetes mellitus without complication (HCC)     NIDD  . Vertigo, aural   . Renal insufficiency   . Asthma     Past Surgical History  Procedure Laterality Date  . Cardiac catheterization    . Coronary stent placement    . Knee surgery Bilateral   . Replacement total knee bilateral Bilateral   . Coronary artery bypass graft  2012  . Joint replacement    . Av fistula placement Left 11-04-2015  . Av fistula placement Left 11/04/2015    Procedure: ARTERIOVENOUS (AV) FISTULA CREATION  ( BRACHIAL CEPHALIC );  Surgeon: Katha Cabal, MD;  Location: [facility] ORS;  Service: Vascular;  Laterality: Left;  . Peripheral vascular catheterization N/A 11/13/2015    Procedure: Dialysis/Perma Catheter Insertion;  Surgeon: Katha Cabal, MD;  Location: [place] [facility];  Service: Cardiovascular;  Laterality: N/A;  . Peripheral vascular catheterization N/A 12/04/2015    Procedure: Dialysis/Perma Catheter Insertion;  Surgeon: Katha Cabal, MD;  Location: [place] [facility];  Service: Cardiovascular;  Laterality: N/A;    Prescriptions prior to admission  Medication Sig Dispense Refill Last Dose  . albuterol (PROVENTIL HFA;VENTOLIN HFA) 108 (90 BASE) MCG/ACT inhaler Inhale into the lungs every 4 (four) hours as needed for wheezing or shortness of breath. 2-4 puffs   12/04/2015 at Unknown time  . amLODipine (NORVASC) 10 MG tablet Take 10 mg by mouth daily.   12/04/2015 at Unknown time  . aspirin 81 MG tablet Take 81 mg by mouth daily.   12/03/2015 at Unknown time  . atorvastatin (LIPITOR) 80 MG tablet Take 80 mg by mouth daily.   12/03/2015 at Unknown time  . calcium carbonate (OSCAL) 1500 (600 Ca) MG TABS tablet Take 600 mg of elemental calcium by mouth daily with breakfast.   12/04/2015 at Unknown time  . Cholecalciferol (VITAMIN D3) 5000 UNITS CAPS Take 1 tablet by mouth daily.   12/04/2015 at Unknown time  . DULoxetine (CYMBALTA) 60 MG capsule Take 60 mg by mouth daily.   12/04/2015 at Unknown time  . FOLIC ACID PO Take 1 mg by mouth daily.    12/04/2015 at Unknown time  . HYDROcodone-acetaminophen (NORCO) 5-325 MG tablet Take 1-2 tablets by mouth every 6 (six) hours as needed for moderate pain or severe pain. [place]  tablet 0 12/04/2015 at Unknown time  . levofloxacin (LEVAQUIN) 500 MG tablet Take 1 tablet (500 mg total) by mouth once. 7 tablet 0   . lubiprostone (AMITIZA) 24 MCG capsule Take 24 mcg by mouth daily as needed for constipation.   Past Week at Unknown time  . meclizine (ANTIVERT) 25 MG tablet Take 25 mg by mouth 3 (three) times daily as needed for dizziness.   Past Month at Unknown time  . methocarbamol (ROBAXIN) 750 MG tablet Take 750 mg by mouth daily as needed for muscle spasms.   Past Month at Unknown time  .  metoprolol succinate (TOPROL-XL) 100 MG 24 hr tablet Take 100 mg by mouth daily. Take with or immediately following a meal.   12/04/2015 at Unknown time  . Multiple Vitamins-Minerals (ONE-A-DAY WOMENS 50 PLUS PO) Take 1 tablet by mouth daily.   12/04/2015 at Unknown time  . nitroGLYCERIN (NITROLINGUAL) 0.4 MG/SPRAY spray Place 1 spray under the tongue every 5 (five) minutes x 3 doses as needed for chest pain.   Past Month at Unknown time  . Omega-3 Fatty Acids (FISH OIL) 1000 MG CPDR Take by mouth once.   12/04/2015 at Unknown time  . Oxycodone HCl 10 MG TABS Limit  1  tab po bid - qid if tolerated daily 120 tablet 0 12/04/2015 at Unknown time  . pantoprazole (PROTONIX) 40 MG tablet Take 40 mg by mouth 2 (two) times daily.   12/04/2015 at Unknown time  . Pramoxine-Dimethicone 1-6 % CREA Apply topically.   Past Month at Unknown time  . sodium bicarbonate 650 MG tablet Take 650 mg by mouth 2 (two) times daily.   12/04/2015 at Unknown time   Social History   Social History  . Marital Status: Divorced    Spouse Name: N/A  . Number of Children: N/A  . Years of Education: N/A   Occupational History  . Not on file.   Social History Main Topics  . Smoking status: Current Every Day Smoker -- 0.50 packs/day    Types: Cigarettes  . Smokeless tobacco: Not on file  . Alcohol Use: No  . Drug Use: No  . Sexual Activity: Yes   Other Topics Concern  . Not on file   Social History Narrative    Family History  Problem Relation Age of Onset  . Cancer Mother   . Cancer Father   . Diabetes Brother   . Heart disease Brother       Review of systems complete and found to be negative unless listed above      PHYSICAL EXAM  General: Well developed, well nourished, in no acute distress HEENT:  Normocephalic and atramatic Neck:  No JVD.  Lungs: Clear bilaterally to auscultation and percussion. Heart: HRRR . Normal S1 and S2 without gallops or murmurs.  Abdomen: Bowel sounds are positive, abdomen  soft and non-tender  Msk:  Back normal, normal gait. Normal strength and tone for age. Extremities: No clubbing, cyanosis or edema.   Neuro: Alert and oriented X 3. Psych:  Good affect, responds appropriately  Labs:   Lab Results  Component Value Date   WBC 4.0 12/17/2015   HGB 8.2* 12/17/2015   HCT 25.8* 12/17/2015   MCV 87.0 12/17/2015   PLT 161 12/17/2015    Recent Labs Lab 12/14/15 1953 12/16/15 2254  NA 135 135  K 2.8* 2.8*  CL 96* 95*  CO2 29 31  BUN 13 10  CREATININE 2.63* 2.67*  CALCIUM 8.3* 8.5*  PROT 7.3  --   BILITOT 0.4  --   ALKPHOS 80  --   ALT 12*  --   AST 22  --   GLUCOSE 134* 99   Lab Results  Component Value Date   CKTOTAL 164 04/08/2014   CKMB 2.4 04/08/2014   TROPONINI 0.03 12/16/2015    Lab Results  Component Value Date   CHOL 138 04/09/2014   CHOL 146 07/22/2013   CHOL 164 06/07/2013   Lab Results  Component Value Date   HDL 39* 04/09/2014   HDL 30* 07/22/2013   HDL 41 06/07/2013   Lab Results  Component Value Date   LDLCALC 75 04/09/2014   LDLCALC 88 07/22/2013   LDLCALC 95 06/07/2013   Lab Results  Component Value Date   TRIG 119 04/09/2014   TRIG 140 07/22/2013   TRIG 139 06/07/2013   No results found for: CHOLHDL No results found for: LDLDIRECT    Radiology: Dg Chest 2 View  12/14/2015  CLINICAL DATA:  Fever, chills, shortness of breath. EXAM: CHEST  2 VIEW COMPARISON:  October 29, 2015. FINDINGS: Stable cardiomegaly. No pneumothorax or pleural effusion is noted. Right internal jugular dialysis catheter is noted with distal tip in expected position of the SVC. Minimally increased interstitial densities are noted in the perihilar and basilar regions suggesting possible minimal pulmonary edema. Bony thorax is unremarkable. IMPRESSION: Minimally increased interstitial densities are noted in the bilateral perihilar and basilar regions suggesting possible minimal pulmonary edema. Electronically Signed   By: Marijo Conception,  M.D.   On: 12/14/2015 20:09   Ct Angio Chest Aorta W/cm &/or Wo/cm  12/17/2015  CLINICAL DATA:  Acute onset of neck pain, generalized chest pain, right abdominal pain and right leg pain. Shortness of breath and chest tightness. Patient on dialysis. Initial encounter. EXAM: CT ANGIOGRAPHY CHEST, ABDOMEN AND PELVIS TECHNIQUE: Multidetector CT imaging through the chest, abdomen and pelvis was performed using the standard protocol during bolus administration of intravenous contrast. Multiplanar reconstructed images and MIPs were obtained and reviewed to evaluate the vascular anatomy. CONTRAST:  100 mL of Isovue 370 IV contrast COMPARISON:  CT of the abdomen and pelvis performed 06/26/2013, and MRI of the lumbar spine performed 05/19/2010 FINDINGS: CTA CHEST FINDINGS There is no evidence of aortic dissection. There is no evidence of aneurysmal dilatation. Mild scattered calcification is noted along the aortic arch. Diffuse coronary artery calcifications are seen. There is no evidence of significant pulmonary embolus. Minimal bilateral peripheral atelectasis or scarring is noted. Mild emphysematous change is noted at the upper lung lobes. Mild bilateral lower lobe bronchiectasis is noted. The lungs are otherwise grossly clear. There is no evidence of significant focal consolidation, pleural effusion or pneumothorax. No masses are identified; no abnormal focal contrast enhancement is seen. Prominent right hilar nodes measure up to 1.2 cm in short axis. A 1.4 cm subcarinal node is noted. No additional mediastinal lymphadenopathy is seen. No pericardial effusion is identified. The great vessels are grossly unremarkable in appearance. No axillary lymphadenopathy is seen. Scattered cystic foci are noted within the thyroid gland, measuring up to 2.1 cm. No acute osseous abnormalities are seen. 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 Review of the MIP images confirms the above findings. CTA ABDOMEN AND  PELVIS FINDINGS There is no evidence of aortic dissection. There is mild aneurysmal dilatation of the infrarenal abdominal aorta, measuring up to 3.3 cm in AP dimension, with mild associated mural thrombus and scattered calcification along  the abdominal aorta and its branches. There is mild ectasia of the common iliac arteries bilaterally. The celiac trunk, superior mesenteric artery, bilateral renal arteries and inferior mesenteric artery appear grossly patent. Two left-sided renal arteries are noted. The inferior vena cava is grossly unremarkable in appearance, though not fully assessed given the phase of contrast enhancement. The liver and spleen are unremarkable in appearance. The gallbladder is within normal limits. The pancreas and adrenal glands are unremarkable. Mild right renal scarring is noted, with a few small right renal cysts. There is no evidence of hydronephrosis. No renal or ureteral stones are seen. No perinephric stranding is appreciated. No free fluid is identified. The small bowel is unremarkable in appearance. The stomach is within normal limits. No acute vascular abnormalities are seen. A small umbilical hernia is noted, containing only fat. The appendix is normal in caliber and contains air, without evidence of appendicitis. Scattered diverticulosis is noted along the ascending, distal transverse, descending and sigmoid colon, without evidence of diverticulitis. The bladder is moderately distended and grossly unremarkable. The uterus is unremarkable in appearance. The ovaries are relatively symmetric. No suspicious adnexal masses are seen. No inguinal lymphadenopathy is seen. No acute osseous abnormalities are identified. Mild degenerative change is noted at both hips, more prominent on the right, with scattered subcortical cysts noted bilaterally. An associated loose body is noted at the right hip joint space. Mild sclerotic change is noted at the sacroiliac joints. Chronic loss of height is  noted at vertebral body L1. Mild facet disease noted at the lower lumbar spine. Review of the MIP images confirms the above findings. IMPRESSION: 1. No evidence of aortic dissection. 2. 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 Full mild ectasia of the common iliac arteries bilaterally. Recommend followup by ultrasound in 3 years. This recommendation follows ACR consensus guidelines: White Paper of the ACR Incidental Findings Committee II on Vascular Findings. J Am Coll Radiol 2013; [id number] 3. Diffuse coronary artery calcifications seen. Mild scattered calcification along the aortic arch. 4. No evidence of significant pulmonary embolus. 5. Minimal bilateral peripheral atelectasis or scarring noted. Mild emphysematous change at the upper lung lobes. Mild bilateral lower lobe bronchiectasis seen. Lungs otherwise clear. 6. Mild right renal scarring, with a few small right renal cysts. 7. Small umbilical hernia, containing only fat. 8. Scattered diverticulosis along the ascending, distal transverse, descending and sigmoid colon, without evidence of diverticulitis. 9. Mild degenerative change at both hips, more prominent on the right, with scattered subcortical cysts and an associated loose body at the right hip joint space. 10. Mild chronic loss of height at L1. Electronically Signed   By: Garald Balding M.D.   On: 12/17/2015 01:11   Ct Angio Abd/pel W/ And/or W/o  12/17/2015  CLINICAL DATA:  Acute onset of neck pain, generalized chest pain, right abdominal pain and right leg pain. Shortness of breath and chest tightness. Patient on dialysis. Initial encounter. EXAM: CT ANGIOGRAPHY CHEST, ABDOMEN AND PELVIS TECHNIQUE: Multidetector CT imaging through the chest, abdomen and pelvis was performed using the standard protocol during bolus administration of intravenous contrast. Multiplanar  reconstructed images and MIPs were obtained and reviewed to evaluate the vascular anatomy. CONTRAST:  100 mL of Isovue 370 IV contrast COMPARISON:  CT of the abdomen and pelvis performed 06/26/2013, and MRI of the lumbar spine performed 05/19/2010 FINDINGS: CTA CHEST FINDINGS There is no evidence of aortic dissection.  There is no evidence of aneurysmal dilatation. Mild scattered calcification is noted along the aortic arch. Diffuse coronary artery calcifications are seen. There is no evidence of significant pulmonary embolus. Minimal bilateral peripheral atelectasis or scarring is noted. Mild emphysematous change is noted at the upper lung lobes. Mild bilateral lower lobe bronchiectasis is noted. The lungs are otherwise grossly clear. There is no evidence of significant focal consolidation, pleural effusion or pneumothorax. No masses are identified; no abnormal focal contrast enhancement is seen. Prominent right hilar nodes measure up to 1.2 cm in short axis. A 1.4 cm subcarinal node is noted. No additional mediastinal lymphadenopathy is seen. No pericardial effusion is identified. The great vessels are grossly unremarkable in appearance. No axillary lymphadenopathy is seen. Scattered cystic foci are noted within the thyroid gland, measuring up to 2.1 cm. No acute osseous abnormalities are seen. 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 Review of the MIP images confirms the above findings. CTA ABDOMEN AND PELVIS FINDINGS There is no evidence of aortic dissection. There is mild aneurysmal dilatation of the infrarenal abdominal aorta, measuring up to 3.3 cm in AP dimension, with mild associated mural thrombus and scattered calcification along the abdominal aorta and its branches. There is mild ectasia of the common iliac arteries bilaterally. The celiac trunk, superior mesenteric artery, bilateral renal arteries and inferior mesenteric artery appear grossly patent. Two left-sided renal arteries are  noted. The inferior vena cava is grossly unremarkable in appearance, though not fully assessed given the phase of contrast enhancement. The liver and spleen are unremarkable in appearance. The gallbladder is within normal limits. The pancreas and adrenal glands are unremarkable. Mild right renal scarring is noted, with a few small right renal cysts. There is no evidence of hydronephrosis. No renal or ureteral stones are seen. No perinephric stranding is appreciated. No free fluid is identified. The small bowel is unremarkable in appearance. The stomach is within normal limits. No acute vascular abnormalities are seen. A small umbilical hernia is noted, containing only fat. The appendix is normal in caliber and contains air, without evidence of appendicitis. Scattered diverticulosis is noted along the ascending, distal transverse, descending and sigmoid colon, without evidence of diverticulitis. The bladder is moderately distended and grossly unremarkable. The uterus is unremarkable in appearance. The ovaries are relatively symmetric. No suspicious adnexal masses are seen. No inguinal lymphadenopathy is seen. No acute osseous abnormalities are identified. Mild degenerative change is noted at both hips, more prominent on the right, with scattered subcortical cysts noted bilaterally. An associated loose body is noted at the right hip joint space. Mild sclerotic change is noted at the sacroiliac joints. Chronic loss of height is noted at vertebral body L1. Mild facet disease noted at the lower lumbar spine. Review of the MIP images confirms the above findings. IMPRESSION: 1. No evidence of aortic dissection. 2. 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 Full mild ectasia of the common iliac arteries bilaterally. Recommend followup by ultrasound in 3 years. This recommendation follows ACR consensus  guidelines: White Paper of the ACR Incidental Findings Committee II on Vascular Findings. J Am Coll Radiol 2013; [id number] 3. Diffuse coronary artery calcifications seen. Mild scattered calcification along the aortic arch. 4. No evidence of significant pulmonary embolus. 5. Minimal bilateral peripheral atelectasis or scarring noted. Mild emphysematous change at the upper lung lobes. Mild bilateral lower lobe bronchiectasis seen. Lungs otherwise clear.  6. Mild right renal scarring, with a few small right renal cysts. 7. Small umbilical hernia, containing only fat. 8. Scattered diverticulosis along the ascending, distal transverse, descending and sigmoid colon, without evidence of diverticulitis. 9. Mild degenerative change at both hips, more prominent on the right, with scattered subcortical cysts and an associated loose body at the right hip joint space. 10. Mild chronic loss of height at L1. Electronically Signed   By: Garald Balding M.D.   On: 12/17/2015 01:11    EKG: Normal sinus rhythm  ASSESSMENT AND PLAN:   1. Chest pain, with atypical features, nondiagnostic ECG, with negative troponin 2. Known CAD, status post multiple coronary stents, negative troponin 3. End-stage renal disease, on chronic hemodialysis  Recommendations  1. Agree with overall current therapy 2. Defer full dose anticoagulation 3. Review 2-D echocardiogram 4. Consider Lexiscan sestamibi study. This could be potentially done as outpatient if patient does well today.  SignedIsaias Cowman MD,PhD, [facility] 12/17/2015, 9:13 AM

## 2015-12-18 ENCOUNTER — Telehealth: Payer: Self-pay

## 2015-12-18 DIAGNOSIS — T82898A Other specified complication of vascular prosthetic devices, implants and grafts, initial encounter: Secondary | ICD-10-CM | POA: Diagnosis not present

## 2015-12-18 LAB — CBC
HCT: 26.1 % — ABNORMAL LOW (ref 35.0–47.0)
HEMOGLOBIN: 8.3 g/dL — AB (ref 12.0–16.0)
MCH: 27.8 pg (ref 26.0–34.0)
MCHC: 31.7 g/dL — ABNORMAL LOW (ref 32.0–36.0)
MCV: 87.7 fL (ref 80.0–100.0)
Platelets: 156 10*3/uL (ref 150–440)
RBC: 2.98 MIL/uL — AB (ref 3.80–5.20)
RDW: 16 % — ABNORMAL HIGH (ref 11.5–14.5)
WBC: 3.6 10*3/uL (ref 3.6–11.0)

## 2015-12-18 LAB — BASIC METABOLIC PANEL
ANION GAP: 9 (ref 5–15)
BUN: 21 mg/dL — ABNORMAL HIGH (ref 6–20)
CHLORIDE: 97 mmol/L — AB (ref 101–111)
CO2: 26 mmol/L (ref 22–32)
Calcium: 8.9 mg/dL (ref 8.9–10.3)
Creatinine, Ser: 4.83 mg/dL — ABNORMAL HIGH (ref 0.44–1.00)
GFR calc Af Amer: 10 mL/min — ABNORMAL LOW (ref >60)
GFR, EST NON AFRICAN AMERICAN: 9 mL/min — AB (ref >60)
GLUCOSE: 101 mg/dL — AB (ref 65–99)
POTASSIUM: 3.4 mmol/L — AB (ref 3.5–5.1)
SODIUM: 132 mmol/L — AB (ref 135–145)

## 2015-12-18 LAB — ECHOCARDIOGRAM COMPLETE
Height: 66 in
WEIGHTICAEL: 3715.2 [oz_av]

## 2015-12-18 LAB — MRSA PCR SCREENING: MRSA BY PCR: NEGATIVE

## 2015-12-18 MED ORDER — LEVOFLOXACIN 500 MG PO TABS
250.0000 mg | ORAL_TABLET | ORAL | Status: DC
  Administered 2015-12-20: 250 mg via ORAL
  Filled 2015-12-18: qty 1

## 2015-12-18 MED ORDER — EPOETIN ALFA 10000 UNIT/ML IJ SOLN
10000.0000 [IU] | Freq: Once | INTRAMUSCULAR | Status: AC
Start: 2015-12-18 — End: 2015-12-18
  Administered 2015-12-18: 10000 [IU] via INTRAVENOUS

## 2015-12-18 MED ORDER — LEVOFLOXACIN 500 MG PO TABS
500.0000 mg | ORAL_TABLET | Freq: Once | ORAL | Status: AC
Start: 2015-12-18 — End: 2015-12-18
  Administered 2015-12-18: 500 mg via ORAL
  Filled 2015-12-18: qty 1

## 2015-12-18 NOTE — Telephone Encounter (Signed)
Pt called to let Dr. Primus Bravo know she was in [facility] hospital Room 271 and she could be reached @ [phone] or Room 271 wants someone to call her back

## 2015-12-18 NOTE — Progress Notes (Signed)
[facility] NAME: Kimberly Shaffer    MR#:  [id number]  DATE OF BIRTH:  Feb 20, 1951  SUBJECTIVE:  CHIEF COMPLAINT:   Chief Complaint  Patient presents with  . Shortness of Breath   - admitted with right sided neck pain and chest pain - diarrhea 3-4 times yesterday, generalized body aches and low grade fevers - troponins stable  REVIEW OF SYSTEMS:  Review of Systems  Constitutional: Positive for fever and malaise/fatigue. Negative for chills.  HENT: Negative for ear discharge, ear pain and nosebleeds.   Eyes: Negative for blurred vision.  Respiratory: Positive for shortness of breath. Negative for cough and wheezing.   Cardiovascular: Positive for chest pain. Negative for palpitations and leg swelling.       Right sided neck pain and chest pain  Gastrointestinal: Positive for nausea and diarrhea. Negative for vomiting, abdominal pain and constipation.  Genitourinary: Negative for dysuria.  Musculoskeletal: Positive for myalgias and neck pain.  Neurological: Negative for dizziness, tingling, speech change, focal weakness, seizures and headaches.  Psychiatric/Behavioral: Negative for depression.    DRUG ALLERGIES:   Allergies  Allergen Reactions  . Nystatin Hives and Itching  . Sulfa Antibiotics Swelling and Hives    VITALS:  Blood pressure 130/49, pulse 82, temperature 100.5 F (38.1 C), temperature source Oral, resp. rate 19, height 5\' 6"  (1.676 m), weight 105.1 kg (231 lb 11.3 oz), SpO2 97 %.  PHYSICAL EXAMINATION:  Physical Exam  GENERAL:  65 y.o.-year-old patient lying in the bed with no acute distress.  EYES: Pupils equal, round, reactive to light and accommodation. No scleral icterus. Extraocular muscles intact.  HEENT: Head atraumatic, normocephalic. Oropharynx and nasopharynx clear.  NECK:  Supple, no jugular venous distention. No thyroid enlargement, no tenderness.  LUNGS: Normal breath sounds bilaterally,  no wheezing, rales,rhonchi or crepitation. No use of accessory muscles of respiration.fine bibasilar rhonchi noted.  CARDIOVASCULAR: S1, S2 normal. No  rubs, or gallops. 2/6 systolic murmur present, right chest permacath noted. ABDOMEN: Soft, nontender, nondistended. Bowel sounds present. No organomegaly or mass.  EXTREMITIES: No pedal edema, cyanosis, or clubbing.  NEUROLOGIC: Cranial nerves II through XII are intact. Muscle strength 5/5 in all extremities. Sensation intact. Gait not checked.  PSYCHIATRIC: The patient is alert and oriented x 3.  SKIN: No obvious rash, lesion, or ulcer.    LABORATORY PANEL:   CBC  Recent Labs Lab 12/18/15 0458  WBC 3.6  HGB 8.3*  HCT 26.1*  PLT 156   ------------------------------------------------------------------------------------------------------------------  Chemistries   Recent Labs Lab 12/14/15 1953  12/18/15 0458  NA 135  < > 132*  K 2.8*  < > 3.4*  CL 96*  < > 97*  CO2 29  < > 26  GLUCOSE 134*  < > 101*  BUN 13  < > 21*  CREATININE 2.63*  < > 4.83*  CALCIUM 8.3*  < > 8.9  AST 22  --   --   ALT 12*  --   --   ALKPHOS 80  --   --   BILITOT 0.4  --   --   < > = values in this interval not displayed. ------------------------------------------------------------------------------------------------------------------  Cardiac Enzymes  Recent Labs Lab 12/17/15 1958  TROPONINI 0.10*   ------------------------------------------------------------------------------------------------------------------  RADIOLOGY:  Ct Angio Chest Aorta W/cm &/or Wo/cm  12/17/2015  CLINICAL DATA:  Acute onset of neck pain, generalized chest pain, right abdominal pain and right leg pain. Shortness of breath and chest  tightness. Patient on dialysis. Initial encounter. EXAM: CT ANGIOGRAPHY CHEST, ABDOMEN AND PELVIS TECHNIQUE: Multidetector CT imaging through the chest, abdomen and pelvis was performed using the standard protocol during bolus  administration of intravenous contrast. Multiplanar reconstructed images and MIPs were obtained and reviewed to evaluate the vascular anatomy. CONTRAST:  100 mL of Isovue 370 IV contrast COMPARISON:  CT of the abdomen and pelvis performed 06/26/2013, and MRI of the lumbar spine performed 05/19/2010 FINDINGS: CTA CHEST FINDINGS There is no evidence of aortic dissection. There is no evidence of aneurysmal dilatation. Mild scattered calcification is noted along the aortic arch. Diffuse coronary artery calcifications are seen. There is no evidence of significant pulmonary embolus. Minimal bilateral peripheral atelectasis or scarring is noted. Mild emphysematous change is noted at the upper lung lobes. Mild bilateral lower lobe bronchiectasis is noted. The lungs are otherwise grossly clear. There is no evidence of significant focal consolidation, pleural effusion or pneumothorax. No masses are identified; no abnormal focal contrast enhancement is seen. Prominent right hilar nodes measure up to 1.2 cm in short axis. A 1.4 cm subcarinal node is noted. No additional mediastinal lymphadenopathy is seen. No pericardial effusion is identified. The great vessels are grossly unremarkable in appearance. No axillary lymphadenopathy is seen. Scattered cystic foci are noted within the thyroid gland, measuring up to 2.1 cm. No acute osseous abnormalities are seen. 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 Review of the MIP images confirms the above findings. CTA ABDOMEN AND PELVIS FINDINGS There is no evidence of aortic dissection. There is mild aneurysmal dilatation of the infrarenal abdominal aorta, measuring up to 3.3 cm in AP dimension, with mild associated mural thrombus and scattered calcification along the abdominal aorta and its branches. There is mild ectasia of the common iliac arteries bilaterally. The celiac trunk, superior mesenteric artery, bilateral renal arteries and inferior mesenteric artery appear  grossly patent. Two left-sided renal arteries are noted. The inferior vena cava is grossly unremarkable in appearance, though not fully assessed given the phase of contrast enhancement. The liver and spleen are unremarkable in appearance. The gallbladder is within normal limits. The pancreas and adrenal glands are unremarkable. Mild right renal scarring is noted, with a few small right renal cysts. There is no evidence of hydronephrosis. No renal or ureteral stones are seen. No perinephric stranding is appreciated. No free fluid is identified. The small bowel is unremarkable in appearance. The stomach is within normal limits. No acute vascular abnormalities are seen. A small umbilical hernia is noted, containing only fat. The appendix is normal in caliber and contains air, without evidence of appendicitis. Scattered diverticulosis is noted along the ascending, distal transverse, descending and sigmoid colon, without evidence of diverticulitis. The bladder is moderately distended and grossly unremarkable. The uterus is unremarkable in appearance. The ovaries are relatively symmetric. No suspicious adnexal masses are seen. No inguinal lymphadenopathy is seen. No acute osseous abnormalities are identified. Mild degenerative change is noted at both hips, more prominent on the right, with scattered subcortical cysts noted bilaterally. An associated loose body is noted at the right hip joint space. Mild sclerotic change is noted at the sacroiliac joints. Chronic loss of height is noted at vertebral body L1. Mild facet disease noted at the lower lumbar spine. Review of the MIP images confirms the above findings. IMPRESSION: 1. No evidence of aortic dissection. 2. 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 Full mild ectasia of the common iliac arteries bilaterally. Recommend followup by ultrasound in 3  years. This recommendation follows ACR consensus guidelines: White Paper of the ACR Incidental Findings Committee II on Vascular Findings. J Am Coll Radiol 2013; [id number] 3. Diffuse coronary artery calcifications seen. Mild scattered calcification along the aortic arch. 4. No evidence of significant pulmonary embolus. 5. Minimal bilateral peripheral atelectasis or scarring noted. Mild emphysematous change at the upper lung lobes. Mild bilateral lower lobe bronchiectasis seen. Lungs otherwise clear. 6. Mild right renal scarring, with a few small right renal cysts. 7. Small umbilical hernia, containing only fat. 8. Scattered diverticulosis along the ascending, distal transverse, descending and sigmoid colon, without evidence of diverticulitis. 9. Mild degenerative change at both hips, more prominent on the right, with scattered subcortical cysts and an associated loose body at the right hip joint space. 10. Mild chronic loss of height at L1. Electronically Signed   By: Garald Balding M.D.   On: 12/17/2015 01:11   Ct Angio Abd/pel W/ And/or W/o  12/17/2015  CLINICAL DATA:  Acute onset of neck pain, generalized chest pain, right abdominal pain and right leg pain. Shortness of breath and chest tightness. Patient on dialysis. Initial encounter. EXAM: CT ANGIOGRAPHY CHEST, ABDOMEN AND PELVIS TECHNIQUE: Multidetector CT imaging through the chest, abdomen and pelvis was performed using the standard protocol during bolus administration of intravenous contrast. Multiplanar reconstructed images and MIPs were obtained and reviewed to evaluate the vascular anatomy. CONTRAST:  100 mL of Isovue 370 IV contrast COMPARISON:  CT of the abdomen and pelvis performed 06/26/2013, and MRI of the lumbar spine performed 05/19/2010 FINDINGS: CTA CHEST FINDINGS There is no evidence of aortic dissection. There is no evidence of aneurysmal dilatation. Mild scattered calcification is noted along the aortic arch. Diffuse coronary artery  calcifications are seen. There is no evidence of significant pulmonary embolus. Minimal bilateral peripheral atelectasis or scarring is noted. Mild emphysematous change is noted at the upper lung lobes. Mild bilateral lower lobe bronchiectasis is noted. The lungs are otherwise grossly clear. There is no evidence of significant focal consolidation, pleural effusion or pneumothorax. No masses are identified; no abnormal focal contrast enhancement is seen. Prominent right hilar nodes measure up to 1.2 cm in short axis. A 1.4 cm subcarinal node is noted. No additional mediastinal lymphadenopathy is seen. No pericardial effusion is identified. The great vessels are grossly unremarkable in appearance. No axillary lymphadenopathy is seen. Scattered cystic foci are noted within the thyroid gland, measuring up to 2.1 cm. No acute osseous abnormalities are seen. 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 Review of the MIP images confirms the above findings. CTA ABDOMEN AND PELVIS FINDINGS There is no evidence of aortic dissection. There is mild aneurysmal dilatation of the infrarenal abdominal aorta, measuring up to 3.3 cm in AP dimension, with mild associated mural thrombus and scattered calcification along the abdominal aorta and its branches. There is mild ectasia of the common iliac arteries bilaterally. The celiac trunk, superior mesenteric artery, bilateral renal arteries and inferior mesenteric artery appear grossly patent. Two left-sided renal arteries are noted. The inferior vena cava is grossly unremarkable in appearance, though not fully assessed given the phase of contrast enhancement. The liver and spleen are unremarkable in appearance. The gallbladder is within normal limits. The pancreas and adrenal glands are unremarkable. Mild right renal scarring is noted, with a few small right renal cysts. There is no evidence of hydronephrosis. No renal or ureteral  stones are seen. No perinephric stranding  is appreciated. No free fluid is identified. The small bowel is unremarkable in appearance. The stomach is within normal limits. No acute vascular abnormalities are seen. A small umbilical hernia is noted, containing only fat. The appendix is normal in caliber and contains air, without evidence of appendicitis. Scattered diverticulosis is noted along the ascending, distal transverse, descending and sigmoid colon, without evidence of diverticulitis. The bladder is moderately distended and grossly unremarkable. The uterus is unremarkable in appearance. The ovaries are relatively symmetric. No suspicious adnexal masses are seen. No inguinal lymphadenopathy is seen. No acute osseous abnormalities are identified. Mild degenerative change is noted at both hips, more prominent on the right, with scattered subcortical cysts noted bilaterally. An associated loose body is noted at the right hip joint space. Mild sclerotic change is noted at the sacroiliac joints. Chronic loss of height is noted at vertebral body L1. Mild facet disease noted at the lower lumbar spine. Review of the MIP images confirms the above findings. IMPRESSION: 1. No evidence of aortic dissection. 2. 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 Full mild ectasia of the common iliac arteries bilaterally. Recommend followup by ultrasound in 3 years. This recommendation follows ACR consensus guidelines: White Paper of the ACR Incidental Findings Committee II on Vascular Findings. J Am Coll Radiol 2013; [id number] 3. Diffuse coronary artery calcifications seen. Mild scattered calcification along the aortic arch. 4. No evidence of significant pulmonary embolus. 5. Minimal bilateral peripheral atelectasis or scarring noted. Mild emphysematous change at the upper lung lobes. Mild bilateral lower lobe bronchiectasis seen. Lungs otherwise  clear. 6. Mild right renal scarring, with a few small right renal cysts. 7. Small umbilical hernia, containing only fat. 8. Scattered diverticulosis along the ascending, distal transverse, descending and sigmoid colon, without evidence of diverticulitis. 9. Mild degenerative change at both hips, more prominent on the right, with scattered subcortical cysts and an associated loose body at the right hip joint space. 10. Mild chronic loss of height at L1. Electronically Signed   By: Garald Balding M.D.   On: 12/17/2015 01:11    EKG:   Orders placed or performed during the hospital encounter of 12/16/15  . ED EKG  . ED EKG  . EKG 12-Lead  . EKG 12-Lead    ASSESSMENT AND PLAN:   65 year old female with past medical history significant for end-stage renal disease on Monday Wednesday Friday hemodialysis, hypertension, COPD not on home oxygen, CAD presents to the hospital secondary to right-sided neck pain and chest pain.  #1 Right sided neck Pain and chest pain-right-sided. Musculoskeletal likely. -Troponins are elevated but stable now. -Appreciate cardiology consult. Due to diffuse coronary calcifications noted on the CT chest, will benefit from a stress test  as outpatient. -Follow up echocardiogram. -CT chest with no pulmonary embolism, no aortic dissection. -Emphysematous and bronchiectatic changes noted - added muscle relaxants  #2 Fever- could be acute bronchitis, influenza test negative - URI symptoms noted - nebs prn and on levaquin - repeat Bl cx initial one is negative on 12/14/15.  #3 Diarrhea- had 4 episodes last night   Will check for GI panel and c diff.  #4 end-stage renal disease on Monday Wednesday Friday hemodialysis- -Nephrology consulted. -For dialysis tomorrow per schedule  #5 anemia of chronic disease-monitor hemoglobin. Stable at this time. -No acute indication for transfusion   #6 HTN- metoprolol  Ambulatory in hospital. Likely no PT needs   All the records  are reviewed and case discussed with Care Management/Social Workerr. Management plans discussed with the patient, family and they are in agreement.  CODE STATUS: Full Code  TOTAL TIME TAKING CARE OF THIS PATIENT: 35 minutes.   POSSIBLE D/C IN 2-3 DAYS, DEPENDING ON CLINICAL CONDITION.   Vaughan Basta M.D on 12/18/2015 at 3:40 PM  Between 7am to 6pm - Pager - [phone]  After 6pm go to [email or web address] - password [email or web address] [facility]  Office  [phone]  CC: Primary care physician; Pcp Not In System

## 2015-12-18 NOTE — Progress Notes (Signed)
PRE HD   

## 2015-12-18 NOTE — Progress Notes (Signed)
TX started 

## 2015-12-18 NOTE — Progress Notes (Signed)
[facility]  ROUNDING NOTE   Subjective:   Seen and examined on hemodialysis treatment. Using RIJ permcath. Tolerating treatment well. UF of 2 litres  Objective:  Vital signs in last 24 hours:  Temp:  [98.7 F (37.1 C)-100.8 F (38.2 C)] 99 F (37.2 C) (03/31 0724) Pulse Rate:  [71-84] 81 (03/31 1020) Resp:  [18-20] 19 (03/31 1020) BP: (115-148)/(48-69) 122/66 mmHg (03/31 1020) SpO2:  [78 %-100 %] 100 % (03/31 1020) Weight:  [108.6 kg (239 lb 6.7 oz)] 108.6 kg (239 lb 6.7 oz) (03/31 1030)  Weight change: -2.675 kg (-5 lb 14.4 oz) Filed Weights   12/16/15 2052 12/17/15 0727 12/18/15 1030  Weight: 108 kg (238 lb 1.6 oz) 105.325 kg (232 lb 3.2 oz) 108.6 kg (239 lb 6.7 oz)    Intake/Output: I/O last 3 completed shifts: In: 363 [P.O.:360; I.V.:3] Out: 850 [Urine:850]   Intake/Output this shift:     Physical Exam: General: NAD, laying in bed  Head: Normocephalic, atraumatic. Moist oral mucosal membranes  Eyes: Anicteric, PERRL  Neck: Supple, trachea midline  Lungs:  Bilateral wheezes  Heart: Regular rate and rhythm  Abdomen:  Soft, nontender,   Extremities: no peripheral edema.  Neurologic: Nonfocal, moving all four extremities  Skin: No lesions  Access: RIJ permcath, left AVF maturing    Basic Metabolic Panel:  Recent Labs Lab 12/14/15 1953 12/16/15 2254 12/17/15 0821 12/18/15 0458  NA 135 135 133* 132*  K 2.8* 2.8* 3.3* 3.4*  CL 96* 95* 97* 97*  CO2 29 31 29 26   GLUCOSE 134* 99 99 101*  BUN 13 10 13  21*  CREATININE 2.63* 2.67* 3.57* 4.83*  CALCIUM 8.3* 8.5* 8.2* 8.9    Liver Function Tests:  Recent Labs Lab 12/14/15 1953  AST 22  ALT 12*  ALKPHOS 80  BILITOT 0.4  PROT 7.3  ALBUMIN 3.4*   No results for input(s): LIPASE, AMYLASE in the last 168 hours. No results for input(s): AMMONIA in the last 168 hours.  CBC:  Recent Labs Lab 12/14/15 1953 12/16/15 2254 12/17/15 0821 12/18/15 0458  WBC 6.4 5.0 4.0 3.6  NEUTROABS 4.7   --   --   --   HGB 8.1* 8.5* 8.2* 8.3*  HCT 25.1* 26.8* 25.8* 26.1*  MCV 87.4 86.0 87.0 87.7  PLT 191 180 161 156    Cardiac Enzymes:  Recent Labs Lab 12/14/15 1953 12/16/15 2254 12/17/15 0821 12/17/15 1409 12/17/15 1958  TROPONINI <0.03 0.03 0.10* 0.09* 0.10*    BNP: Invalid input(s): POCBNP  CBG: No results for input(s): GLUCAP in the last 168 hours.  Microbiology: Results for orders placed or performed during the hospital encounter of 12/16/15  MRSA PCR Screening     Status: None   Collection Time: 12/18/15  3:22 AM  Result Value Ref Range Status   MRSA by PCR NEGATIVE NEGATIVE Final    Comment:        The GeneXpert MRSA Assay (FDA approved for NASAL specimens only), is one component of a comprehensive MRSA colonization surveillance program. It is not intended to diagnose MRSA infection nor to guide or monitor treatment for MRSA infections.     Coagulation Studies: No results for input(s): LABPROT, INR in the last 72 hours.  Urinalysis: No results for input(s): COLORURINE, LABSPEC, PHURINE, GLUCOSEU, HGBUR, BILIRUBINUR, KETONESUR, PROTEINUR, UROBILINOGEN, NITRITE, LEUKOCYTESUR in the last 72 hours.  Invalid input(s): APPERANCEUR    Imaging: Ct Angio Chest Aorta W/cm &/or Wo/cm  12/17/2015  CLINICAL DATA:  Acute  onset of neck pain, generalized chest pain, right abdominal pain and right leg pain. Shortness of breath and chest tightness. Patient on dialysis. Initial encounter. EXAM: CT ANGIOGRAPHY CHEST, ABDOMEN AND PELVIS TECHNIQUE: Multidetector CT imaging through the chest, abdomen and pelvis was performed using the standard protocol during bolus administration of intravenous contrast. Multiplanar reconstructed images and MIPs were obtained and reviewed to evaluate the vascular anatomy. CONTRAST:  100 mL of Isovue 370 IV contrast COMPARISON:  CT of the abdomen and pelvis performed 06/26/2013, and MRI of the lumbar spine performed 05/19/2010 FINDINGS: CTA  CHEST FINDINGS There is no evidence of aortic dissection. There is no evidence of aneurysmal dilatation. Mild scattered calcification is noted along the aortic arch. Diffuse coronary artery calcifications are seen. There is no evidence of significant pulmonary embolus. Minimal bilateral peripheral atelectasis or scarring is noted. Mild emphysematous change is noted at the upper lung lobes. Mild bilateral lower lobe bronchiectasis is noted. The lungs are otherwise grossly clear. There is no evidence of significant focal consolidation, pleural effusion or pneumothorax. No masses are identified; no abnormal focal contrast enhancement is seen. Prominent right hilar nodes measure up to 1.2 cm in short axis. A 1.4 cm subcarinal node is noted. No additional mediastinal lymphadenopathy is seen. No pericardial effusion is identified. The great vessels are grossly unremarkable in appearance. No axillary lymphadenopathy is seen. Scattered cystic foci are noted within the thyroid gland, measuring up to 2.1 cm. No acute osseous abnormalities are seen. 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 Review of the MIP images confirms the above findings. CTA ABDOMEN AND PELVIS FINDINGS There is no evidence of aortic dissection. There is mild aneurysmal dilatation of the infrarenal abdominal aorta, measuring up to 3.3 cm in AP dimension, with mild associated mural thrombus and scattered calcification along the abdominal aorta and its branches. There is mild ectasia of the common iliac arteries bilaterally. The celiac trunk, superior mesenteric artery, bilateral renal arteries and inferior mesenteric artery appear grossly patent. Two left-sided renal arteries are noted. The inferior vena cava is grossly unremarkable in appearance, though not fully assessed given the phase of contrast enhancement. The liver and spleen are unremarkable in appearance. The gallbladder is within normal limits. The pancreas and adrenal glands  are unremarkable. Mild right renal scarring is noted, with a few small right renal cysts. There is no evidence of hydronephrosis. No renal or ureteral stones are seen. No perinephric stranding is appreciated. No free fluid is identified. The small bowel is unremarkable in appearance. The stomach is within normal limits. No acute vascular abnormalities are seen. A small umbilical hernia is noted, containing only fat. The appendix is normal in caliber and contains air, without evidence of appendicitis. Scattered diverticulosis is noted along the ascending, distal transverse, descending and sigmoid colon, without evidence of diverticulitis. The bladder is moderately distended and grossly unremarkable. The uterus is unremarkable in appearance. The ovaries are relatively symmetric. No suspicious adnexal masses are seen. No inguinal lymphadenopathy is seen. No acute osseous abnormalities are identified. Mild degenerative change is noted at both hips, more prominent on the right, with scattered subcortical cysts noted bilaterally. An associated loose body is noted at the right hip joint space. Mild sclerotic change is noted at the sacroiliac joints. Chronic loss of height is noted at vertebral body L1. Mild facet disease noted at the lower lumbar spine. Review of the MIP images confirms the above findings. IMPRESSION: 1. No evidence of aortic dissection. 2. 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 Full mild ectasia of the common iliac arteries bilaterally. Recommend followup by ultrasound in 3 years. This recommendation follows ACR consensus guidelines: White Paper of the ACR Incidental Findings Committee II on Vascular Findings. J Am Coll Radiol 2013; [id number] 3. Diffuse coronary artery calcifications seen. Mild scattered calcification along the aortic arch. 4. No evidence of significant  pulmonary embolus. 5. Minimal bilateral peripheral atelectasis or scarring noted. Mild emphysematous change at the upper lung lobes. Mild bilateral lower lobe bronchiectasis seen. Lungs otherwise clear. 6. Mild right renal scarring, with a few small right renal cysts. 7. Small umbilical hernia, containing only fat. 8. Scattered diverticulosis along the ascending, distal transverse, descending and sigmoid colon, without evidence of diverticulitis. 9. Mild degenerative change at both hips, more prominent on the right, with scattered subcortical cysts and an associated loose body at the right hip joint space. 10. Mild chronic loss of height at L1. Electronically Signed   By: Garald Balding M.D.   On: 12/17/2015 01:11   Ct Angio Abd/pel W/ And/or W/o  12/17/2015  CLINICAL DATA:  Acute onset of neck pain, generalized chest pain, right abdominal pain and right leg pain. Shortness of breath and chest tightness. Patient on dialysis. Initial encounter. EXAM: CT ANGIOGRAPHY CHEST, ABDOMEN AND PELVIS TECHNIQUE: Multidetector CT imaging through the chest, abdomen and pelvis was performed using the standard protocol during bolus administration of intravenous contrast. Multiplanar reconstructed images and MIPs were obtained and reviewed to evaluate the vascular anatomy. CONTRAST:  100 mL of Isovue 370 IV contrast COMPARISON:  CT of the abdomen and pelvis performed 06/26/2013, and MRI of the lumbar spine performed 05/19/2010 FINDINGS: CTA CHEST FINDINGS There is no evidence of aortic dissection. There is no evidence of aneurysmal dilatation. Mild scattered calcification is noted along the aortic arch. Diffuse coronary artery calcifications are seen. There is no evidence of significant pulmonary embolus. Minimal bilateral peripheral atelectasis or scarring is noted. Mild emphysematous change is noted at the upper lung lobes. Mild bilateral lower lobe bronchiectasis is noted. The lungs are otherwise grossly clear. There is no  evidence of significant focal consolidation, pleural effusion or pneumothorax. No masses are identified; no abnormal focal contrast enhancement is seen. Prominent right hilar nodes measure up to 1.2 cm in short axis. A 1.4 cm subcarinal node is noted. No additional mediastinal lymphadenopathy is seen. No pericardial effusion is identified. The great vessels are grossly unremarkable in appearance. No axillary lymphadenopathy is seen. Scattered cystic foci are noted within the thyroid gland, measuring up to 2.1 cm. No acute osseous abnormalities are seen. 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 Review of the MIP images confirms the above findings. CTA ABDOMEN AND PELVIS FINDINGS There is no evidence of aortic dissection. There is mild aneurysmal dilatation of the infrarenal abdominal aorta, measuring up to 3.3 cm in AP dimension, with mild associated mural thrombus and scattered calcification along the abdominal aorta and its branches. There is mild ectasia of the common iliac arteries bilaterally. The celiac trunk, superior mesenteric artery, bilateral renal arteries and inferior mesenteric artery appear grossly patent. Two left-sided renal arteries are noted. The inferior vena cava is grossly unremarkable in appearance, though not fully assessed given the phase of contrast enhancement. The liver and spleen are unremarkable in appearance. The gallbladder is within normal limits. The pancreas and adrenal glands are unremarkable. Mild right renal scarring  is noted, with a few small right renal cysts. There is no evidence of hydronephrosis. No renal or ureteral stones are seen. No perinephric stranding is appreciated. No free fluid is identified. The small bowel is unremarkable in appearance. The stomach is within normal limits. No acute vascular abnormalities are seen. A small umbilical hernia is noted, containing only fat. The appendix is normal in caliber and contains air, without evidence of  appendicitis. Scattered diverticulosis is noted along the ascending, distal transverse, descending and sigmoid colon, without evidence of diverticulitis. The bladder is moderately distended and grossly unremarkable. The uterus is unremarkable in appearance. The ovaries are relatively symmetric. No suspicious adnexal masses are seen. No inguinal lymphadenopathy is seen. No acute osseous abnormalities are identified. Mild degenerative change is noted at both hips, more prominent on the right, with scattered subcortical cysts noted bilaterally. An associated loose body is noted at the right hip joint space. Mild sclerotic change is noted at the sacroiliac joints. Chronic loss of height is noted at vertebral body L1. Mild facet disease noted at the lower lumbar spine. Review of the MIP images confirms the above findings. IMPRESSION: 1. No evidence of aortic dissection. 2. 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 Full mild ectasia of the common iliac arteries bilaterally. Recommend followup by ultrasound in 3 years. This recommendation follows ACR consensus guidelines: White Paper of the ACR Incidental Findings Committee II on Vascular Findings. J Am Coll Radiol 2013; [id number] 3. Diffuse coronary artery calcifications seen. Mild scattered calcification along the aortic arch. 4. No evidence of significant pulmonary embolus. 5. Minimal bilateral peripheral atelectasis or scarring noted. Mild emphysematous change at the upper lung lobes. Mild bilateral lower lobe bronchiectasis seen. Lungs otherwise clear. 6. Mild right renal scarring, with a few small right renal cysts. 7. Small umbilical hernia, containing only fat. 8. Scattered diverticulosis along the ascending, distal transverse, descending and sigmoid colon, without evidence of diverticulitis. 9. Mild degenerative change at both hips, more  prominent on the right, with scattered subcortical cysts and an associated loose body at the right hip joint space. 10. Mild chronic loss of height at L1. Electronically Signed   By: Garald Balding M.D.   On: 12/17/2015 01:11     Medications:     . amLODipine  10 mg Oral Daily  . aspirin  81 mg Oral Daily  . atorvastatin  80 mg Oral q1800  . cholecalciferol  5,000 Units Oral Daily  . DULoxetine  60 mg Oral Daily  . folic acid  1 mg Oral Daily  . heparin  5,000 Units Subcutaneous 3 times per day  . [START ON 12/20/2015] levofloxacin  250 mg Oral Q48H  . levofloxacin  500 mg Oral Once  . lubiprostone  24 mcg Oral Q breakfast  . metoprolol succinate  100 mg Oral Daily  . pantoprazole  40 mg Oral BID  . sodium chloride flush  3 mL Intravenous Q12H   sodium chloride, acetaminophen, albuterol, HYDROcodone-acetaminophen, nitroGLYCERIN, ondansetron (ZOFRAN) IV, sodium chloride flush  Assessment/ Plan:  [name] is a 65 y.o. black female with hypertension, coronary artery disease s/p CABG, peripheral vascular disease, diabetes mellitus type II, hyperlipidemia, bilateral total knee replacement  1. ESRD: MWF [place] Seen and examined on hemodialysis treatment. Tolerating treatment well. Continue MWF schedule  2. Anemia of chronic kidney disease: hemoglobin 8.3 - epo with treatment  3. Secondary Hyperparathyroidism: with elevated PTH >1100. Phos at goal.   4. Hypertension: blood pressure at goal.  - amlodipine, metoprolol     LOS:  Kimberly Shaffer 3/31/201711:13 AM

## 2015-12-18 NOTE — Progress Notes (Signed)
Pharmacy Antibiotic Note  Kimberly Shaffer is a 65 y.o. female admitted on 12/16/2015 with bronchitis.  Pharmacy has been consulted for levofloxacin dosing.  Patient is ESRD requiring HD on MWF scheduled.   Plan: The dose of levofloxacin will be adjusted to 500 mg po once then 250 mg po q48h based on renal function.  Height: 5\' 6"  (167.6 cm) Weight: 232 lb 3.2 oz (105.325 kg) IBW/kg (Calculated) : 59.3  Temp (24hrs), Avg:99.8 F (37.7 C), Min:98.7 F (37.1 C), Max:100.8 F (38.2 C)   Recent Labs Lab 12/14/15 1953 12/16/15 2254 12/17/15 0821 12/18/15 0458  WBC 6.4 5.0 4.0  --   CREATININE 2.63* 2.67* 3.57* 4.83*    Estimated Creatinine Clearance: 14.4 mL/min (by C-G formula based on Cr of 4.83).    Allergies  Allergen Reactions  . Nystatin Hives and Itching  . Sulfa Antibiotics Swelling and Hives    Antimicrobials this admission: Anti-infectives    Start     Dose/Rate Route Frequency Ordered Stop   12/20/15 1000  levofloxacin (LEVAQUIN) tablet 250 mg     250 mg Oral Every 48 hours 12/18/15 0832     12/18/15 0845  levofloxacin (LEVAQUIN) tablet 500 mg     500 mg Oral  Once 12/18/15 [id number]        Microbiology results: Results for orders placed or performed during the hospital encounter of 12/16/15  MRSA PCR Screening     Status: None   Collection Time: 12/18/15  3:22 AM  Result Value Ref Range Status   MRSA by PCR NEGATIVE NEGATIVE Final    Comment:        The GeneXpert MRSA Assay (FDA approved for NASAL specimens only), is one component of a comprehensive MRSA colonization surveillance program. It is not intended to diagnose MRSA infection nor to guide or monitor treatment for MRSA infections.      Thank you for allowing pharmacy to be a part of this patient's care.  Amillion Macchia G 12/18/2015 8:32 AM

## 2015-12-18 NOTE — Progress Notes (Signed)
MD Elvina Sidle was asked about sutures in perm cath, pt will followup outpt

## 2015-12-18 NOTE — Progress Notes (Signed)
[facility]  SUBJECTIVE: I don't have chest pain   Filed Vitals:   12/17/15 2206 12/18/15 0321 12/18/15 0526 12/18/15 0724  BP:  148/56 116/60 137/68  Pulse:  79 71 73  Temp: 100.3 F (37.9 C) 98.7 F (37.1 C) 99.3 F (37.4 C) 99 F (37.2 C)  TempSrc: Oral Oral Oral Oral  Resp:  20 20   Height:      Weight:      SpO2:  94% 92% 93%     Intake/Output Summary (Last 24 hours) at 12/18/15 [id number] Last data filed at 12/18/15 [id number]  Gross per 24 hour  Intake    243 ml  Output    850 ml  Net   -607 ml      PHYSICAL EXAM  General: Well developed, well nourished, in no acute distress HEENT:  Normocephalic and atramatic Neck:  No JVD.  Lungs: Clear bilaterally to auscultation and percussion. Heart: HRRR . Normal S1 and S2 without gallops or murmurs.  Abdomen: Bowel sounds are positive, abdomen soft and non-tender  Msk:  Back normal, normal gait. Normal strength and tone for age. Extremities: No clubbing, cyanosis or edema.   Neuro: Alert and oriented X 3. Psych:  Good affect, responds appropriately   LABS: Basic Metabolic Panel:  Recent Labs  12/17/15 0821 12/18/15 0458  NA 133* 132*  K 3.3* 3.4*  CL 97* 97*  CO2 29 26  GLUCOSE 99 101*  BUN 13 21*  CREATININE 3.57* 4.83*  CALCIUM 8.2* 8.9   Liver Function Tests: No results for input(s): AST, ALT, ALKPHOS, BILITOT, PROT, ALBUMIN in the last 72 hours. No results for input(s): LIPASE, AMYLASE in the last 72 hours. CBC:  Recent Labs  12/17/15 0821 12/18/15 0458  WBC 4.0 3.6  HGB 8.2* 8.3*  HCT 25.8* 26.1*  MCV 87.0 87.7  PLT 161 156   Cardiac Enzymes:  Recent Labs  12/17/15 0821 12/17/15 1409 12/17/15 1958  TROPONINI 0.10* 0.09* 0.10*   BNP: Invalid input(s): POCBNP D-Dimer: No results for input(s): DDIMER in the last 72 hours. Hemoglobin A1C: No results for input(s): HGBA1C in the last 72 hours. Fasting Lipid Panel:  Recent Labs  12/17/15 0821  CHOL 118  HDL 24*  LDLCALC 66  TRIG 138   CHOLHDL 4.9   Thyroid Function Tests: No results for input(s): TSH, T4TOTAL, T3FREE, THYROIDAB in the last 72 hours.  Invalid input(s): FREET3 Anemia Panel: No results for input(s): VITAMINB12, FOLATE, FERRITIN, TIBC, IRON, RETICCTPCT in the last 72 hours.  Ct Angio Chest Aorta W/cm &/or Wo/cm  12/17/2015  CLINICAL DATA:  Acute onset of neck pain, generalized chest pain, right abdominal pain and right leg pain. Shortness of breath and chest tightness. Patient on dialysis. Initial encounter. EXAM: CT ANGIOGRAPHY CHEST, ABDOMEN AND PELVIS TECHNIQUE: Multidetector CT imaging through the chest, abdomen and pelvis was performed using the standard protocol during bolus administration of intravenous contrast. Multiplanar reconstructed images and MIPs were obtained and reviewed to evaluate the vascular anatomy. CONTRAST:  100 mL of Isovue 370 IV contrast COMPARISON:  CT of the abdomen and pelvis performed 06/26/2013, and MRI of the lumbar spine performed 05/19/2010 FINDINGS: CTA CHEST FINDINGS There is no evidence of aortic dissection. There is no evidence of aneurysmal dilatation. Mild scattered calcification is noted along the aortic arch. Diffuse coronary artery calcifications are seen. There is no evidence of significant pulmonary embolus. Minimal bilateral peripheral atelectasis or scarring is noted. Mild emphysematous change is noted at the upper lung  lobes. Mild bilateral lower lobe bronchiectasis is noted. The lungs are otherwise grossly clear. There is no evidence of significant focal consolidation, pleural effusion or pneumothorax. No masses are identified; no abnormal focal contrast enhancement is seen. Prominent right hilar nodes measure up to 1.2 cm in short axis. A 1.4 cm subcarinal node is noted. No additional mediastinal lymphadenopathy is seen. No pericardial effusion is identified. The great vessels are grossly unremarkable in appearance. No axillary lymphadenopathy is seen. Scattered cystic  foci are noted within the thyroid gland, measuring up to 2.1 cm. No acute osseous abnormalities are seen. 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 Review of the MIP images confirms the above findings. CTA ABDOMEN AND PELVIS FINDINGS There is no evidence of aortic dissection. There is mild aneurysmal dilatation of the infrarenal abdominal aorta, measuring up to 3.3 cm in AP dimension, with mild associated mural thrombus and scattered calcification along the abdominal aorta and its branches. There is mild ectasia of the common iliac arteries bilaterally. The celiac trunk, superior mesenteric artery, bilateral renal arteries and inferior mesenteric artery appear grossly patent. Two left-sided renal arteries are noted. The inferior vena cava is grossly unremarkable in appearance, though not fully assessed given the phase of contrast enhancement. The liver and spleen are unremarkable in appearance. The gallbladder is within normal limits. The pancreas and adrenal glands are unremarkable. Mild right renal scarring is noted, with a few small right renal cysts. There is no evidence of hydronephrosis. No renal or ureteral stones are seen. No perinephric stranding is appreciated. No free fluid is identified. The small bowel is unremarkable in appearance. The stomach is within normal limits. No acute vascular abnormalities are seen. A small umbilical hernia is noted, containing only fat. The appendix is normal in caliber and contains air, without evidence of appendicitis. Scattered diverticulosis is noted along the ascending, distal transverse, descending and sigmoid colon, without evidence of diverticulitis. The bladder is moderately distended and grossly unremarkable. The uterus is unremarkable in appearance. The ovaries are relatively symmetric. No suspicious adnexal masses are seen. No inguinal lymphadenopathy is seen. No acute osseous abnormalities are identified. Mild degenerative change is noted at  both hips, more prominent on the right, with scattered subcortical cysts noted bilaterally. An associated loose body is noted at the right hip joint space. Mild sclerotic change is noted at the sacroiliac joints. Chronic loss of height is noted at vertebral body L1. Mild facet disease noted at the lower lumbar spine. Review of the MIP images confirms the above findings. IMPRESSION: 1. No evidence of aortic dissection. 2. 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 Full mild ectasia of the common iliac arteries bilaterally. Recommend followup by ultrasound in 3 years. This recommendation follows ACR consensus guidelines: White Paper of the ACR Incidental Findings Committee II on Vascular Findings. J Am Coll Radiol 2013; [id number] 3. Diffuse coronary artery calcifications seen. Mild scattered calcification along the aortic arch. 4. No evidence of significant pulmonary embolus. 5. Minimal bilateral peripheral atelectasis or scarring noted. Mild emphysematous change at the upper lung lobes. Mild bilateral lower lobe bronchiectasis seen. Lungs otherwise clear. 6. Mild right renal scarring, with a few small right renal cysts. 7. Small umbilical hernia, containing only fat. 8. Scattered diverticulosis along the ascending, distal transverse, descending and sigmoid colon, without evidence of diverticulitis. 9. Mild degenerative change at both hips, more prominent on the right,  with scattered subcortical cysts and an associated loose body at the right hip joint space. 10. Mild chronic loss of height at L1. Electronically Signed   By: Garald Balding M.D.   On: 12/17/2015 01:11   Ct Angio Abd/pel W/ And/or W/o  12/17/2015  CLINICAL DATA:  Acute onset of neck pain, generalized chest pain, right abdominal pain and right leg pain. Shortness of breath and chest tightness. Patient on dialysis. Initial  encounter. EXAM: CT ANGIOGRAPHY CHEST, ABDOMEN AND PELVIS TECHNIQUE: Multidetector CT imaging through the chest, abdomen and pelvis was performed using the standard protocol during bolus administration of intravenous contrast. Multiplanar reconstructed images and MIPs were obtained and reviewed to evaluate the vascular anatomy. CONTRAST:  100 mL of Isovue 370 IV contrast COMPARISON:  CT of the abdomen and pelvis performed 06/26/2013, and MRI of the lumbar spine performed 05/19/2010 FINDINGS: CTA CHEST FINDINGS There is no evidence of aortic dissection. There is no evidence of aneurysmal dilatation. Mild scattered calcification is noted along the aortic arch. Diffuse coronary artery calcifications are seen. There is no evidence of significant pulmonary embolus. Minimal bilateral peripheral atelectasis or scarring is noted. Mild emphysematous change is noted at the upper lung lobes. Mild bilateral lower lobe bronchiectasis is noted. The lungs are otherwise grossly clear. There is no evidence of significant focal consolidation, pleural effusion or pneumothorax. No masses are identified; no abnormal focal contrast enhancement is seen. Prominent right hilar nodes measure up to 1.2 cm in short axis. A 1.4 cm subcarinal node is noted. No additional mediastinal lymphadenopathy is seen. No pericardial effusion is identified. The great vessels are grossly unremarkable in appearance. No axillary lymphadenopathy is seen. Scattered cystic foci are noted within the thyroid gland, measuring up to 2.1 cm. No acute osseous abnormalities are seen. 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 Review of the MIP images confirms the above findings. CTA ABDOMEN AND PELVIS FINDINGS There is no evidence of aortic dissection. There is mild aneurysmal dilatation of the infrarenal abdominal aorta, measuring up to 3.3 cm in AP dimension, with mild associated mural thrombus and scattered calcification along the abdominal aorta and  its branches. There is mild ectasia of the common iliac arteries bilaterally. The celiac trunk, superior mesenteric artery, bilateral renal arteries and inferior mesenteric artery appear grossly patent. Two left-sided renal arteries are noted. The inferior vena cava is grossly unremarkable in appearance, though not fully assessed given the phase of contrast enhancement. The liver and spleen are unremarkable in appearance. The gallbladder is within normal limits. The pancreas and adrenal glands are unremarkable. Mild right renal scarring is noted, with a few small right renal cysts. There is no evidence of hydronephrosis. No renal or ureteral stones are seen. No perinephric stranding is appreciated. No free fluid is identified. The small bowel is unremarkable in appearance. The stomach is within normal limits. No acute vascular abnormalities are seen. A small umbilical hernia is noted, containing only fat. The appendix is normal in caliber and contains air, without evidence of appendicitis. Scattered diverticulosis is noted along the ascending, distal transverse, descending and sigmoid colon, without evidence of diverticulitis. The bladder is moderately distended and grossly unremarkable. The uterus is unremarkable in appearance. The ovaries are relatively symmetric. No suspicious adnexal masses are seen. No inguinal lymphadenopathy is seen. No acute osseous abnormalities are identified. Mild degenerative change is noted at both hips, more prominent on the right, with scattered subcortical cysts noted bilaterally. An associated loose body is noted at the  right hip joint space. Mild sclerotic change is noted at the sacroiliac joints. Chronic loss of height is noted at vertebral body L1. Mild facet disease noted at the lower lumbar spine. Review of the MIP images confirms the above findings. IMPRESSION: 1. No evidence of aortic dissection. 2. 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 Full mild ectasia of the common iliac arteries bilaterally. Recommend followup by ultrasound in 3 years. This recommendation follows ACR consensus guidelines: White Paper of the ACR Incidental Findings Committee II on Vascular Findings. J Am Coll Radiol 2013; [id number] 3. Diffuse coronary artery calcifications seen. Mild scattered calcification along the aortic arch. 4. No evidence of significant pulmonary embolus. 5. Minimal bilateral peripheral atelectasis or scarring noted. Mild emphysematous change at the upper lung lobes. Mild bilateral lower lobe bronchiectasis seen. Lungs otherwise clear. 6. Mild right renal scarring, with a few small right renal cysts. 7. Small umbilical hernia, containing only fat. 8. Scattered diverticulosis along the ascending, distal transverse, descending and sigmoid colon, without evidence of diverticulitis. 9. Mild degenerative change at both hips, more prominent on the right, with scattered subcortical cysts and an associated loose body at the right hip joint space. 10. Mild chronic loss of height at L1. Electronically Signed   By: Garald Balding M.D.   On: 12/17/2015 01:11     Echo   TELEMETRY: Sinus rhythm:  ASSESSMENT AND PLAN:  Principal Problem:   Chest pain at rest Active Problems:   Chest pain    1. Atypical chest pain, resolved, negative troponin, nondiagnostic ECG 2. Right flank discomfort, mild fever, possible viral syndrome  Recommendations  1. Agree with overall current therapy 2. Defer full dose anticoagulation 3. Review 2-D echocardiogram 4. Consider outpatient Lexiscan sestamibi study   Sammantha Mehlhaff, MD, PhD, [facility] 12/18/2015 9:09 AM

## 2015-12-19 ENCOUNTER — Observation Stay: Payer: Medicare HMO

## 2015-12-19 DIAGNOSIS — T82898A Other specified complication of vascular prosthetic devices, implants and grafts, initial encounter: Secondary | ICD-10-CM | POA: Diagnosis not present

## 2015-12-19 LAB — GASTROINTESTINAL PANEL BY PCR, STOOL (REPLACES STOOL CULTURE)
Adenovirus F40/41: NOT DETECTED
Astrovirus: NOT DETECTED
CRYPTOSPORIDIUM: NOT DETECTED
CYCLOSPORA CAYETANENSIS: NOT DETECTED
Campylobacter species: NOT DETECTED
E. COLI O157: NOT DETECTED
Entamoeba histolytica: NOT DETECTED
Enteroaggregative E coli (EAEC): NOT DETECTED
Enteropathogenic E coli (EPEC): DETECTED — AB
Enterotoxigenic E coli (ETEC): NOT DETECTED
Giardia lamblia: NOT DETECTED
Norovirus GI/GII: NOT DETECTED
Plesimonas shigelloides: NOT DETECTED
ROTAVIRUS A: NOT DETECTED
SALMONELLA SPECIES: NOT DETECTED
SAPOVIRUS (I, II, IV, AND V): NOT DETECTED
SHIGELLA/ENTEROINVASIVE E COLI (EIEC): NOT DETECTED
Shiga like toxin producing E coli (STEC): NOT DETECTED
VIBRIO SPECIES: NOT DETECTED
Vibrio cholerae: NOT DETECTED
YERSINIA ENTEROCOLITICA: NOT DETECTED

## 2015-12-19 LAB — C DIFFICILE QUICK SCREEN W PCR REFLEX
C DIFFICILE (CDIFF) INTERP: NEGATIVE
C Diff antigen: NEGATIVE
C Diff toxin: NEGATIVE

## 2015-12-19 LAB — CULTURE, BLOOD (ROUTINE X 2)
CULTURE: NO GROWTH
Culture: NO GROWTH

## 2015-12-19 NOTE — Progress Notes (Signed)
[facility] Card[facility]  SUBJECTIVE: I don't have chest pain   Filed Vitals:   12/18/15 1953 12/18/15 2318 12/19/15 0611 12/19/15 0804  BP: 100/53  124/64 128/75  Pulse: 78  79 78  Temp:  98.7 F (37.1 C) 99 F (37.2 C) 98.5 F (36.9 C)  TempSrc:  Oral Oral Oral  Resp:   18 18  Height:      Weight:      SpO2: 91%  92% 91%     Intake/Output Summary (Last 24 hours) at 12/19/15 0911 Last data filed at 12/19/15 0700  Gross per 24 hour  Intake      0 ml  Output      0 ml  Net      0 ml      PHYSICAL EXAM  General: Well developed, well nourished, in no acute distress HEENT:  Normocephalic and atramatic Neck:  No JVD.  Lungs: Clear bilaterally to auscultation and percussion. Heart: HRRR . Normal S1 and S2 without gallops or murmurs.  Abdomen: Bowel sounds are positive, abdomen soft and non-tender  Msk:  Back normal, normal gait. Normal strength and tone for age. Extremities: No clubbing, cyanosis or edema.   Neuro: Alert and oriented X 3. Psych:  Good affect, responds appropriately   LABS: Basic Metabolic Panel:  Recent Labs  12/17/15 0821 12/18/15 0458  NA 133* 132*  K 3.3* 3.4*  CL 97* 97*  CO2 29 26  GLUCOSE 99 101*  BUN 13 21*  CREATININE 3.57* 4.83*  CALCIUM 8.2* 8.9   Liver Function Tests: No results for input(s): AST, ALT, ALKPHOS, BILITOT, PROT, ALBUMIN in the last 72 hours. No results for input(s): LIPASE, AMYLASE in the last 72 hours. CBC:  Recent Labs  12/17/15 0821 12/18/15 0458  WBC 4.0 3.6  HGB 8.2* 8.3*  HCT 25.8* 26.1*  MCV 87.0 87.7  PLT 161 156   Cardiac Enzymes:  Recent Labs  12/17/15 0821 12/17/15 1409 12/17/15 1958  TROPONINI 0.10* 0.09* 0.10*   BNP: Invalid input(s): POCBNP D-Dimer: No results for input(s): DDIMER in the last 72 hours. Hemoglobin A1C: No results for input(s): HGBA1C in the last 72 hours. Fasting Lipid Panel:  Recent Labs  12/17/15 0821  CHOL 118  HDL 24*  LDLCALC 66  TRIG 138  CHOLHDL 4.9    Thyroid Function Tests: No results for input(s): TSH, T4TOTAL, T3FREE, THYROIDAB in the last 72 hours.  Invalid input(s): FREET3 Anemia Panel: No results for input(s): VITAMINB12, FOLATE, FERRITIN, TIBC, IRON, RETICCTPCT in the last 72 hours.  No results found.   Echo: normal left ventricular function, LVEF 50-55% with mild mitral regurgitation  TELEMETRY: Sinus rhythm:  ASSESSMENT AND PLAN:  Principal Problem:   Chest pain at rest Active Problems:   Chest pain    1. Atypical chest pain, resolved, negative troponin, nondiagnostic ECG, normal left ventricular function by 2-D echocardiogram 2. Right flank discomfort, mild fever, probable viral syndrome  Recommendations  1. Agree with overall current therapy 2. No further cardiac diagnostics at this time, may consider outpatient Lexiscan sestamibi study  Sign off for now, please call if any questions   Kailan Laws, MD, PhD, [facility] 12/19/2015 9:11 AM

## 2015-12-19 NOTE — Progress Notes (Signed)
[facility] [place] NAME: Kimberly Shaffer    MR#:  [id number]  DATE OF BIRTH:  10-18-1950  SUBJECTIVE:  CHIEF COMPLAINT:   Chief Complaint  Patient presents with  . Shortness of Breath   - admitted with right sided neck pain and chest pain - diarrhea under control now , generalized body aches and low grade fevers - troponins stable - some wheezing now.  REVIEW OF SYSTEMS:  Review of Systems  Constitutional: Positive for fever and malaise/fatigue. Negative for chills.  HENT: Negative for ear discharge, ear pain and nosebleeds.   Eyes: Negative for blurred vision.  Respiratory: Positive for shortness of breath. Negative for cough and wheezing.   Cardiovascular: Positive for chest pain. Negative for palpitations and leg swelling.       Right sided neck pain and chest pain  Gastrointestinal: Positive for nausea and diarrhea. Negative for vomiting, abdominal pain and constipation.  Genitourinary: Negative for dysuria.  Musculoskeletal: Positive for myalgias and neck pain.  Neurological: Negative for dizziness, tingling, speech change, focal weakness, seizures and headaches.  Psychiatric/Behavioral: Negative for depression.    DRUG ALLERGIES:   Allergies  Allergen Reactions  . Nystatin Hives and Itching  . Sulfa Antibiotics Swelling and Hives    VITALS:  Blood pressure 111/66, pulse 77, temperature 98.2 F (36.8 C), temperature source Oral, resp. rate 19, height 5\' 6"  (1.676 m), weight 105.1 kg (231 lb 11.3 oz), SpO2 91 %.  PHYSICAL EXAMINATION:  Physical Exam  GENERAL:  65 y.o.-year-old patient lying in the bed with no acute distress.  EYES: Pupils equal, round, reactive to light and accommodation. No scleral icterus. Extraocular muscles intact.  HEENT: Head atraumatic, normocephalic. Oropharynx and nasopharynx clear.  NECK:  Supple, no jugular venous distention. No thyroid enlargement, no tenderness.  LUNGS: Normal breath  sounds bilaterally, no wheezing, rales,rhonchi or crepitation. No use of accessory muscles of respiration.fine bibasilar rhonchi noted.  CARDIOVASCULAR: S1, S2 normal. No  rubs, or gallops. 2/6 systolic murmur present, right chest permacath noted. ABDOMEN: Soft, nontender, nondistended. Bowel sounds present. No organomegaly or mass.  EXTREMITIES: No pedal edema, cyanosis, or clubbing.  NEUROLOGIC: Cranial nerves II through XII are intact. Muscle strength 5/5 in all extremities. Sensation intact. Gait not checked.  PSYCHIATRIC: The patient is alert and oriented x 3.  SKIN: No obvious rash, lesion, or ulcer.    LABORATORY PANEL:   CBC  Recent Labs Lab 12/18/15 0458  WBC 3.6  HGB 8.3*  HCT 26.1*  PLT 156   ------------------------------------------------------------------------------------------------------------------  Chemistries   Recent Labs Lab 12/14/15 1953  12/18/15 0458  NA 135  < > 132*  K 2.8*  < > 3.4*  CL 96*  < > 97*  CO2 29  < > 26  GLUCOSE 134*  < > 101*  BUN 13  < > 21*  CREATININE 2.63*  < > 4.83*  CALCIUM 8.3*  < > 8.9  AST 22  --   --   ALT 12*  --   --   ALKPHOS 80  --   --   BILITOT 0.4  --   --   < > = values in this interval not displayed. ------------------------------------------------------------------------------------------------------------------  Cardiac Enzymes  Recent Labs Lab 12/17/15 1958  TROPONINI 0.10*   ------------------------------------------------------------------------------------------------------------------  RADIOLOGY:  No results found.  EKG:   Orders placed or performed during the hospital encounter of 12/16/15  . ED EKG  . ED EKG  . EKG  12-Lead  . EKG 12-Lead    ASSESSMENT AND PLAN:   65 year old female with past medical history significant for end-stage renal disease on Monday Wednesday Friday hemodialysis, hypertension, COPD not on home oxygen, CAD presents to the hospital secondary to right-sided neck  pain and chest pain.  #1 Right sided neck Pain and chest pain-right-sided. Musculoskeletal likely. -Troponins are elevated but stable now. -Appreciate cardiology consult. Due to diffuse coronary calcifications noted on the CT chest, will benefit from a stress test  as outpatient. -Follow up echocardiogram. -CT chest with no pulmonary embolism, no aortic dissection. -Emphysematous and bronchiectatic changes noted - added muscle relaxants  #2 Fever- could be acute bronchitis, influenza test negative - URI symptoms noted - nebs prn and on levaquin - repeat Bl cx as initial one is negative on 12/14/15. - repeat Xray chest today  #3 Diarrhea-    checked GI panel and c diff.   All negative except EPEC- no further needs.  #4 end-stage renal disease on Monday Wednesday Friday hemodialysis- -Nephrology consulted. -For dialysis tomorrow per schedule  #5 anemia of chronic disease-monitor hemoglobin. Stable at this time. -No acute indication for transfusion   #6 HTN- metoprolol  Ambulatory in hospital. Likely no PT needs   All the records are reviewed and case discussed with Care Management/Social Workerr. Management plans discussed with the patient, family and they are in agreement.  CODE STATUS: Full Code  TOTAL TIME TAKING CARE OF THIS PATIENT: 30 minutes.   POSSIBLE D/C IN 2-3 DAYS, DEPENDING ON CLINICAL CONDITION. Still had fever last evening.  Vaughan Basta M.D on 12/19/2015 at 4:11 PM  Between 7am to 6pm - Pager - [phone]  After 6pm go to [email or web address] - password [email or web address] [facility]  Office  [phone]  CC: Primary care physician; Pcp Not In System

## 2015-12-19 NOTE — Progress Notes (Signed)
[facility]  ROUNDING NOTE   Subjective:   Hemodialysis treatment yesterday. Tolerated treatment well. Daughter at bedside.   Objective:  Vital signs in last 24 hours:  Temp:  [98.5 F (36.9 C)-101.1 F (38.4 C)] 98.5 F (36.9 C) (04/01 0804) Pulse Rate:  [76-82] 78 (04/01 0804) Resp:  [18-19] 18 (04/01 0804) BP: (100-140)/(49-122) 128/75 mmHg (04/01 0804) SpO2:  [88 %-100 %] 91 % (04/01 0804) Weight:  [105.1 kg (231 lb 11.3 oz)-108.6 kg (239 lb 6.7 oz)] 105.1 kg (231 lb 11.3 oz) (03/31 1338)  Weight change: 3.275 kg (7 lb 3.5 oz) Filed Weights   12/17/15 0727 12/18/15 1030 12/18/15 1338  Weight: 105.325 kg (232 lb 3.2 oz) 108.6 kg (239 lb 6.7 oz) 105.1 kg (231 lb 11.3 oz)    Intake/Output: I/O last 3 completed shifts: In: 363 [P.O.:360; I.V.:3] Out: 850 [Urine:850]   Intake/Output this shift:     Physical Exam: General: NAD, laying in bed  Head: Normocephalic, atraumatic. Moist oral mucosal membranes  Eyes: Anicteric, PERRL  Neck: Supple, trachea midline  Lungs:  Bilateral wheezes  Heart: Regular rate and rhythm  Abdomen:  Soft, nontender,   Extremities: no peripheral edema.  Neurologic: Nonfocal, moving all four extremities  Skin: No lesions  Access: RIJ permcath, left AVF maturing    Basic Metabolic Panel:  Recent Labs Lab 12/14/15 1953 12/16/15 2254 12/17/15 0821 12/18/15 0458  NA 135 135 133* 132*  K 2.8* 2.8* 3.3* 3.4*  CL 96* 95* 97* 97*  CO2 29 31 29 26   GLUCOSE 134* 99 99 101*  BUN 13 10 13  21*  CREATININE 2.63* 2.67* 3.57* 4.83*  CALCIUM 8.3* 8.5* 8.2* 8.9    Liver Function Tests:  Recent Labs Lab 12/14/15 1953  AST 22  ALT 12*  ALKPHOS 80  BILITOT 0.4  PROT 7.3  ALBUMIN 3.4*   No results for input(s): LIPASE, AMYLASE in the last 168 hours. No results for input(s): AMMONIA in the last 168 hours.  CBC:  Recent Labs Lab 12/14/15 1953 12/16/15 2254 12/17/15 0821 12/18/15 0458  WBC 6.4 5.0 4.0 3.6  NEUTROABS  4.7  --   --   --   HGB 8.1* 8.5* 8.2* 8.3*  HCT 25.1* 26.8* 25.8* 26.1*  MCV 87.4 86.0 87.0 87.7  PLT 191 180 161 156    Cardiac Enzymes:  Recent Labs Lab 12/14/15 1953 12/16/15 2254 12/17/15 0821 12/17/15 1409 12/17/15 1958  TROPONINI <0.03 0.03 0.10* 0.09* 0.10*    BNP: Invalid input(s): POCBNP  CBG: No results for input(s): GLUCAP in the last 168 hours.  Microbiology: Results for orders placed or performed during the hospital encounter of 12/16/15  MRSA PCR Screening     Status: None   Collection Time: 12/18/15  3:22 AM  Result Value Ref Range Status   MRSA by PCR NEGATIVE NEGATIVE Final    Comment:        The GeneXpert MRSA Assay (FDA approved for NASAL specimens only), is one component of a comprehensive MRSA colonization surveillance program. It is not intended to diagnose MRSA infection nor to guide or monitor treatment for MRSA infections.   CULTURE, BLOOD (ROUTINE X 2) w Reflex to PCR ID Panel     Status: None (Preliminary result)   Collection Time: 12/18/15  3:58 PM  Result Value Ref Range Status   Specimen Description BLOOD RIGHT WRIST  Final   Special Requests   Final    BOTTLES DRAWN AEROBIC AND ANAEROBIC  AER 5CC  ANA [place]   Culture NO GROWTH < 24 HOURS  Final   Report Status PENDING  Incomplete  CULTURE, BLOOD (ROUTINE X 2) w Reflex to PCR ID Panel     Status: None (Preliminary result)   Collection Time: 12/18/15  4:00 PM  Result Value Ref Range Status   Specimen Description BLOOD RIGHT HAND  Final   Special Requests BOTTLES DRAWN AEROBIC AND ANAEROBIC  [place]  Final   Culture NO GROWTH < 24 HOURS  Final   Report Status PENDING  Incomplete    Coagulation Studies: No results for input(s): LABPROT, INR in the last 72 hours.  Urinalysis: No results for input(s): COLORURINE, LABSPEC, PHURINE, GLUCOSEU, HGBUR, BILIRUBINUR, KETONESUR, PROTEINUR, UROBILINOGEN, NITRITE, LEUKOCYTESUR in the last 72 hours.  Invalid input(s): APPERANCEUR     Imaging: No results found.   Medications:     . amLODipine  10 mg Oral Daily  . aspirin  81 mg Oral Daily  . atorvastatin  80 mg Oral q1800  . cholecalciferol  5,000 Units Oral Daily  . DULoxetine  60 mg Oral Daily  . folic acid  1 mg Oral Daily  . heparin  5,000 Units Subcutaneous 3 times per day  . [START ON 12/20/2015] levofloxacin  250 mg Oral Q48H  . lubiprostone  24 mcg Oral Q breakfast  . metoprolol succinate  100 mg Oral Daily  . pantoprazole  40 mg Oral BID  . sodium chloride flush  3 mL Intravenous Q12H   sodium chloride, acetaminophen, albuterol, HYDROcodone-acetaminophen, nitroGLYCERIN, ondansetron (ZOFRAN) IV, sodium chloride flush  Assessment/ Plan:  Ms. FUTURE HAUS is a 65 y.o. black female with hypertension, coronary artery disease s/p CABG, peripheral vascular disease, diabetes mellitus type II, hyperlipidemia, bilateral total knee replacement  1. ESRD: MWF [place]  Tolerated treatment well. Continue MWF schedule  2. Anemia of chronic kidney disease:  - epo with treatment  3. Secondary Hyperparathyroidism: with elevated PTH >1100. Phos at goal.   4. Hypertension: blood pressure at goal.  - amlodipine, metoprolol  5. Acute exacerbation of COPD:  - levofloxacin, steroids, oxygen and nebs.    LOS:  Falynn Ailey, Lurena Nida 4/1/20179:21 AM

## 2015-12-19 NOTE — Progress Notes (Signed)
DR Anselm Jungling WAS MADE AWARE OF PT'S ABNORMAL RESULT , NO NEW ORDER WILL CONTINUE TO MONITOR

## 2015-12-20 DIAGNOSIS — T82898A Other specified complication of vascular prosthetic devices, implants and grafts, initial encounter: Secondary | ICD-10-CM | POA: Diagnosis not present

## 2015-12-20 LAB — BASIC METABOLIC PANEL
Anion gap: 12 (ref 5–15)
BUN: 33 mg/dL — AB (ref 6–20)
CO2: 23 mmol/L (ref 22–32)
CREATININE: 6.35 mg/dL — AB (ref 0.44–1.00)
Calcium: 8.5 mg/dL — ABNORMAL LOW (ref 8.9–10.3)
Chloride: 98 mmol/L — ABNORMAL LOW (ref 101–111)
GFR calc Af Amer: 7 mL/min — ABNORMAL LOW (ref >60)
GFR calc non Af Amer: 6 mL/min — ABNORMAL LOW (ref >60)
GLUCOSE: 109 mg/dL — AB (ref 65–99)
POTASSIUM: 3.3 mmol/L — AB (ref 3.5–5.1)
Sodium: 133 mmol/L — ABNORMAL LOW (ref 135–145)

## 2015-12-20 LAB — CBC
HCT: 27.7 % — ABNORMAL LOW (ref 35.0–47.0)
HEMOGLOBIN: 9 g/dL — AB (ref 12.0–16.0)
MCH: 28.6 pg (ref 26.0–34.0)
MCHC: 32.5 g/dL (ref 32.0–36.0)
MCV: 88 fL (ref 80.0–100.0)
Platelets: 144 10*3/uL — ABNORMAL LOW (ref 150–440)
RBC: 3.14 MIL/uL — AB (ref 3.80–5.20)
RDW: 16.6 % — ABNORMAL HIGH (ref 11.5–14.5)
WBC: 4.9 10*3/uL (ref 3.6–11.0)

## 2015-12-20 MED ORDER — LEVOFLOXACIN 250 MG PO TABS: 250.0000 mg | ORAL_TABLET | ORAL | Status: DC

## 2015-12-20 MED ORDER — POTASSIUM CHLORIDE CRYS ER 20 MEQ PO TBCR
40.0000 meq | EXTENDED_RELEASE_TABLET | Freq: Once | ORAL | Status: AC
  Administered 2015-12-20: 40 meq via ORAL
  Filled 2015-12-20: qty 2

## 2015-12-20 NOTE — Progress Notes (Signed)
[facility]  ROUNDING NOTE   Subjective:   Daughter at bedside.  Patient reports her breathing has improved. Eating breakfast when examined  Objective:  Vital signs in last 24 hours:  Temp:  [98.2 F (36.8 C)-98.5 F (36.9 C)] 98.5 F (36.9 C) (04/02 0403) Pulse Rate:  [73-77] 75 (04/02 0403) Resp:  [18-19] 18 (04/02 0403) BP: (102-111)/(64-67) 110/64 mmHg (04/02 0403) SpO2:  [90 %-94 %] 90 % (04/02 0403)  Weight change:  Filed Weights   12/17/15 0727 12/18/15 1030 12/18/15 1338  Weight: 105.325 kg (232 lb 3.2 oz) 108.6 kg (239 lb 6.7 oz) 105.1 kg (231 lb 11.3 oz)    Intake/Output: I/O last 3 completed shifts: In: 240 [P.O.:240] Out: 0    Intake/Output this shift:     Physical Exam: General: NAD, laying in bed  Head: Normocephalic, atraumatic. Moist oral mucosal membranes  Eyes: Anicteric, PERRL  Neck: Supple, trachea midline  Lungs:  Clear bilaterally  Heart: Regular rate and rhythm  Abdomen:  Soft, nontender,   Extremities: no peripheral edema.  Neurologic: Nonfocal, moving all four extremities  Skin: No lesions  Access: RIJ permcath, left AVF maturing    Basic Metabolic Panel:  Recent Labs Lab 12/14/15 1953 12/16/15 2254 12/17/15 0821 12/18/15 0458 12/20/15 0520  NA 135 135 133* 132* 133*  K 2.8* 2.8* 3.3* 3.4* 3.3*  CL 96* 95* 97* 97* 98*  CO2 29 31 29 26 23   GLUCOSE 134* 99 99 101* 109*  BUN 13 10 13  21* 33*  CREATININE 2.63* 2.67* 3.57* 4.83* 6.35*  CALCIUM 8.3* 8.5* 8.2* 8.9 8.5*    Liver Function Tests:  Recent Labs Lab 12/14/15 1953  AST 22  ALT 12*  ALKPHOS 80  BILITOT 0.4  PROT 7.3  ALBUMIN 3.4*   No results for input(s): LIPASE, AMYLASE in the last 168 hours. No results for input(s): AMMONIA in the last 168 hours.  CBC:  Recent Labs Lab 12/14/15 1953 12/16/15 2254 12/17/15 0821 12/18/15 0458 12/20/15 0520  WBC 6.4 5.0 4.0 3.6 4.9  NEUTROABS 4.7  --   --   --   --   HGB 8.1* 8.5* 8.2* 8.3* 9.0*  HCT  25.1* 26.8* 25.8* 26.1* 27.7*  MCV 87.4 86.0 87.0 87.7 88.0  PLT 191 180 161 156 144*    Cardiac Enzymes:  Recent Labs Lab 12/14/15 1953 12/16/15 2254 12/17/15 0821 12/17/15 1409 12/17/15 1958  TROPONINI <0.03 0.03 0.10* 0.09* 0.10*    BNP: Invalid input(s): POCBNP  CBG: No results for input(s): GLUCAP in the last 168 hours.  Microbiology: Results for orders placed or performed during the hospital encounter of 12/16/15  MRSA PCR Screening     Status: None   Collection Time: 12/18/15  3:22 AM  Result Value Ref Range Status   MRSA by PCR NEGATIVE NEGATIVE Final    Comment:        The GeneXpert MRSA Assay (FDA approved for NASAL specimens only), is one component of a comprehensive MRSA colonization surveillance program. It is not intended to diagnose MRSA infection nor to guide or monitor treatment for MRSA infections.   Gastrointestinal Panel by PCR , Stool     Status: Abnormal   Collection Time: 12/18/15 10:24 AM  Result Value Ref Range Status   Campylobacter species NOT DETECTED NOT DETECTED Final   Plesimonas shigelloides NOT DETECTED NOT DETECTED Final   Salmonella species NOT DETECTED NOT DETECTED Final   Yersinia enterocolitica NOT DETECTED NOT DETECTED Final  Vibrio species NOT DETECTED NOT DETECTED Final   Vibrio cholerae NOT DETECTED NOT DETECTED Final   Enteroaggregative E coli (EAEC) NOT DETECTED NOT DETECTED Final   Enteropathogenic E coli (EPEC) DETECTED (A) NOT DETECTED Final    Comment: CRITICAL RESULT CALLED TO, READ BACK BY AND VERIFIED WITH: ABIGAIL JACKSON,RN 12/19/2015 1249 BY JRS.    Enterotoxigenic E coli (ETEC) NOT DETECTED NOT DETECTED Final   Shiga like toxin producing E coli (STEC) NOT DETECTED NOT DETECTED Final   E. coli O157 NOT DETECTED NOT DETECTED Final   Shigella/Enteroinvasive E coli (EIEC) NOT DETECTED NOT DETECTED Final   Cryptosporidium NOT DETECTED NOT DETECTED Final   Cyclospora cayetanensis NOT DETECTED NOT DETECTED  Final   Entamoeba histolytica NOT DETECTED NOT DETECTED Final   Giardia lamblia NOT DETECTED NOT DETECTED Final   Adenovirus F40/41 NOT DETECTED NOT DETECTED Final   Astrovirus NOT DETECTED NOT DETECTED Final   Norovirus GI/GII NOT DETECTED NOT DETECTED Final   Rotavirus A NOT DETECTED NOT DETECTED Final   Sapovirus (I, II, IV, and V) NOT DETECTED NOT DETECTED Final  C difficile quick scan w PCR reflex     Status: None   Collection Time: 12/18/15 10:24 AM  Result Value Ref Range Status   C Diff antigen NEGATIVE NEGATIVE Final   C Diff toxin NEGATIVE NEGATIVE Final   C Diff interpretation Negative for C. difficile  Final  CULTURE, BLOOD (ROUTINE X 2) w Reflex to PCR ID Panel     Status: None (Preliminary result)   Collection Time: 12/18/15  3:58 PM  Result Value Ref Range Status   Specimen Description BLOOD RIGHT WRIST  Final   Special Requests   Final    BOTTLES DRAWN AEROBIC AND ANAEROBIC  AER 5CC ANA [place]   Culture NO GROWTH 2 DAYS  Final   Report Status PENDING  Incomplete  CULTURE, BLOOD (ROUTINE X 2) w Reflex to PCR ID Panel     Status: None (Preliminary result)   Collection Time: 12/18/15  4:00 PM  Result Value Ref Range Status   Specimen Description BLOOD RIGHT HAND  Final   Special Requests BOTTLES DRAWN AEROBIC AND ANAEROBIC  [place]  Final   Culture NO GROWTH 2 DAYS  Final   Report Status PENDING  Incomplete    Coagulation Studies: No results for input(s): LABPROT, INR in the last 72 hours.  Urinalysis: No results for input(s): COLORURINE, LABSPEC, PHURINE, GLUCOSEU, HGBUR, BILIRUBINUR, KETONESUR, PROTEINUR, UROBILINOGEN, NITRITE, LEUKOCYTESUR in the last 72 hours.  Invalid input(s): APPERANCEUR    Imaging: Dg Chest 2 View  12/19/2015  CLINICAL DATA:  Bronchitis, weakness, shortness of breath, COPD, hypertension, diabetes, coronary artery disease post MI/PTCA/CABG EXAM: CHEST  2 VIEW COMPARISON:  12/14/2015 FINDINGS: RIGHT jugular central venous catheter with tip  projecting over SVC. Enlargement of cardiac silhouette. Tortuosity of thoracic aorta with atherosclerotic calcification. Bronchitic changes with subsegmental atelectasis LEFT upper lobe. No acute infiltrate, pleural effusion or pneumothorax. Bones appear demineralized. IMPRESSION: Enlargement of cardiac silhouette. Bronchitic changes with LEFT upper lobe subsegmental atelectasis. Electronically Signed   By: Lavonia Dana M.D.   On: 12/19/2015 16:51     Medications:     . amLODipine  10 mg Oral Daily  . aspirin  81 mg Oral Daily  . atorvastatin  80 mg Oral q1800  . cholecalciferol  5,000 Units Oral Daily  . DULoxetine  60 mg Oral Daily  . folic acid  1 mg Oral Daily  . heparin  5,000 Units Subcutaneous 3 times per day  . levofloxacin  250 mg Oral Q48H  . lubiprostone  24 mcg Oral Q breakfast  . metoprolol succinate  100 mg Oral Daily  . pantoprazole  40 mg Oral BID  . sodium chloride flush  3 mL Intravenous Q12H   sodium chloride, acetaminophen, albuterol, HYDROcodone-acetaminophen, nitroGLYCERIN, ondansetron (ZOFRAN) IV, sodium chloride flush  Assessment/ Plan:  [name] is a 65 y.o. black female with hypertension, coronary artery disease s/p CABG, peripheral vascular disease, diabetes mellitus type II, hyperlipidemia, bilateral total knee replacement  1. ESRD: MWF [place]  Tolerated treatment well on Friday. Continue MWF schedule  2. Anemia of chronic kidney disease:  - epo with treatment  3. Secondary Hyperparathyroidism: with elevated PTH >1100. Phos at goal. - discussed the role of cinacalcet with patient. Will start as outpatient.    4. Hypertension: blood pressure at goal.  - amlodipine, metoprolol  5. Acute exacerbation of COPD: no wheezing on today's exam - levofloxacin, steroids, oxygen and nebs.    LOS:  Paddy Walthall, Lurena Nida 4/2/20178:30 AM

## 2015-12-20 NOTE — Discharge Summary (Signed)
[facility] NAME: Kimberly Shaffer    MR#:  [id number]  DATE OF BIRTH:  1951-07-01  DATE OF ADMISSION:  12/16/2015 ADMITTING PHYSICIAN: Saundra Shelling, MD  DATE OF DISCHARGE: 12/20/2015  PRIMARY CARE PHYSICIAN: Pcp Not In System    ADMISSION DIAGNOSIS:  Shortness of breath [R06.02] Neck pain [M54.2] Abnormal EKG [R94.31] Right leg pain [M79.604] Right sided abdominal pain [R10.9] Abdominal pain [R10.9] Chest pain, unspecified chest pain type [R07.9]  DISCHARGE DIAGNOSIS:  Principal Problem:   Chest pain at rest Active Problems:   Chest pain   Acute bronchitis.  SECONDARY DIAGNOSIS:   Past Medical History  Diagnosis Date  . Myocardial infarction ([place])   . COPD (chronic obstructive pulmonary disease) ([place])   . Hypertension   . Obesity   . Depression   . Hypercholesteremia   . H/O blood clots   . Stroke ([place])   . H/O angina pectoris   . CAD (coronary artery disease)   . Diabetes mellitus without complication (HCC)     NIDD  . Vertigo, aural   . Renal insufficiency   . Asthma     HOSPITAL COURSE:   #1 Right sided neck Pain and chest pain-right-sided. Musculoskeletal likely. -Troponins are elevated but stable now. - Appreciate cardiology consult. Due to diffuse coronary calcifications noted on the CT chest, will benefit from a stress test as outpatient. - echocardiogram- moderate concentric hypertrophy with EF 55%. - CT chest with no pulmonary embolism, no aortic dissection. - Emphysematous and bronchiectatic changes noted - added muscle relaxants  #2 Fever- could be acute bronchitis, influenza test negative - URI symptoms noted - nebs prn and on levaquin - repeat Bl cx as initial one is negative on 12/14/15. -  Negative repeat bl cx, Xray stable.  #3 Diarrhea-   checked GI panel and c diff.  All negative except EPEC- no further needs.  #4 end-stage renal disease on Monday Wednesday Friday  hemodialysis- -Nephrology consulted. -For dialysis per schedule  #5 anemia of chronic disease-monitor hemoglobin. Stable at this time. -No acute indication for transfusion   #6 HTN- metoprolol   DISCHARGE CONDITIONS:   Stable.  CONSULTS OBTAINED:  Treatment Team:  Isaias Cowman, MD Lavonia Dana, MD  DRUG ALLERGIES:   Allergies  Allergen Reactions  . Nystatin Hives and Itching  . Sulfa Antibiotics Swelling and Hives    DISCHARGE MEDICATIONS:   Current Discharge Medication List    CONTINUE these medications which have CHANGED   Details  levofloxacin (LEVAQUIN) 250 MG tablet Take 1 tablet (250 mg total) by mouth every other day. Qty: 2 tablet, Refills: 0      CONTINUE these medications which have NOT CHANGED   Details  albuterol (PROVENTIL HFA;VENTOLIN HFA) 108 (90 BASE) MCG/ACT inhaler Inhale into the lungs every 4 (four) hours as needed for wheezing or shortness of breath. 2-4 puffs    amLODipine (NORVASC) 10 MG tablet Take 10 mg by mouth daily.    aspirin 81 MG tablet Take 81 mg by mouth daily.    atorvastatin (LIPITOR) 80 MG tablet Take 80 mg by mouth daily.    calcium carbonate (OSCAL) 1500 (600 Ca) MG TABS tablet Take 600 mg of elemental calcium by mouth daily with breakfast.    Cholecalciferol (VITAMIN D3) 5000 UNITS CAPS Take 1 tablet by mouth daily.    DULoxetine (CYMBALTA) 60 MG capsule Take 60 mg by mouth daily.    FOLIC ACID PO Take 1  mg by mouth daily.     HYDROcodone-acetaminophen (NORCO) 5-325 MG tablet Take 1-2 tablets by mouth every 6 (six) hours as needed for moderate pain or severe pain. Qty: 50 tablet, Refills: 0    lubiprostone (AMITIZA) 24 MCG capsule Take 24 mcg by mouth daily as needed for constipation.    meclizine (ANTIVERT) 25 MG tablet Take 25 mg by mouth 3 (three) times daily as needed for dizziness.    methocarbamol (ROBAXIN) 750 MG tablet Take 750 mg by mouth daily as needed for muscle spasms.    metoprolol succinate  (TOPROL-XL) 100 MG 24 hr tablet Take 100 mg by mouth daily. Take with or immediately following a meal.    Multiple Vitamins-Minerals (ONE-A-DAY WOMENS 50 PLUS PO) Take 1 tablet by mouth daily.    nitroGLYCERIN (NITROLINGUAL) 0.4 MG/SPRAY spray Place 1 spray under the tongue every 5 (five) minutes x 3 doses as needed for chest pain.    Omega-3 Fatty Acids (FISH OIL) 1000 MG CPDR Take by mouth once.    Oxycodone HCl 10 MG TABS Limit  1  tab po bid - qid if tolerated daily Qty: 120 tablet, Refills: 0    pantoprazole (PROTONIX) 40 MG tablet Take 40 mg by mouth 2 (two) times daily.    Pramoxine-Dimethicone 1-6 % CREA Apply topically.    sodium bicarbonate 650 MG tablet Take 650 mg by mouth 2 (two) times daily.         DISCHARGE INSTRUCTIONS:    Follow with cardiologist in 2 weeks.  If you experience worsening of your admission symptoms, develop shortness of breath, life threatening emergency, suicidal or homicidal thoughts you must seek medical attention immediately by calling 911 or calling your MD immediately  if symptoms less severe.  You Must read complete instructions/literature along with all the possible adverse reactions/side effects for all the Medicines you take and that have been prescribed to you. Take any new Medicines after you have completely understood and accept all the possible adverse reactions/side effects.   Please note  You were cared for by a hospitalist during your hospital stay. If you have any questions about your discharge medications or the care you received while you were in the hospital after you are discharged, you can call the unit and asked to speak with the hospitalist on call if the hospitalist that took care of you is not available. Once you are discharged, your primary care physician will handle any further medical issues. Please note that NO REFILLS for any discharge medications will be authorized once you are discharged, as it is imperative that you  return to your primary care physician (or establish a relationship with a primary care physician if you do not have one) for your aftercare needs so that they can reassess your need for medications and monitor your lab values.    Today   CHIEF COMPLAINT:   Chief Complaint  Patient presents with  . Shortness of Breath    HISTORY OF PRESENT ILLNESS:  Kimberly Shaffer  is a 65 y.o. female with a known history of End-stage renal disease on dialysis, COPD, hypertension, hyperlipidemia, coronary artery disease presented to the emergency room with chest pain and pain radiating from right side of the neck to the groin since yesterday. Pain was 6 out of 10 scale of 1-10. Pain is dull aching in nature. Patient was worked up with a CT aortogram which showed no aortic dissection. First set of troponin was negative and patient gets dialyzed on  Monday witnessed and Friday. No history of fever or chills and cough. No history of headache dizziness blurry vision. No history of syncope or seizure. No history of hematemesis hemoptysis or rectal bleed. EKG showed T-wave abnormalities.   VITAL SIGNS:  Blood pressure 105/61, pulse 66, temperature 98 F (36.7 C), temperature source Oral, resp. rate 18, height 5\' 6"  (1.676 m), weight 105.1 kg (231 lb 11.3 oz), SpO2 95 %.  I/O:   Intake/Output Summary (Last 24 hours) at 12/20/15 1413 Last data filed at 12/20/15 1351  Gross per 24 hour  Intake    480 ml  Output      1 ml  Net    479 ml    PHYSICAL EXAMINATION:   GENERAL: 65 y.o.-year-old patient lying in the bed with no acute distress.  EYES: Pupils equal, round, reactive to light and accommodation. No scleral icterus. Extraocular muscles intact.  HEENT: Head atraumatic, normocephalic. Oropharynx and nasopharynx clear.  NECK: Supple, no jugular venous distention. No thyroid enlargement, no tenderness.  LUNGS: Normal breath sounds bilaterally, no wheezing, rales,rhonchi or crepitation. No use of  accessory muscles of respiration.fine bibasilar rhonchi noted.  CARDIOVASCULAR: S1, S2 normal. No rubs, or gallops. 2/6 systolic murmur present, right chest permacath noted. ABDOMEN: Soft, nontender, nondistended. Bowel sounds present. No organomegaly or mass.  EXTREMITIES: No pedal edema, cyanosis, or clubbing.  NEUROLOGIC: Cranial nerves II through XII are intact. Muscle strength 5/5 in all extremities. Sensation intact. Gait not checked.  PSYCHIATRIC: The patient is alert and oriented x 3.  SKIN: No obvious rash, lesion, or ulcer.   DATA REVIEW:   CBC  Recent Labs Lab 12/20/15 0520  WBC 4.9  HGB 9.0*  HCT 27.7*  PLT 144*    Chemistries   Recent Labs Lab 12/14/15 1953  12/20/15 0520  NA 135  < > 133*  K 2.8*  < > 3.3*  CL 96*  < > 98*  CO2 29  < > 23  GLUCOSE 134*  < > 109*  BUN 13  < > 33*  CREATININE 2.63*  < > 6.35*  CALCIUM 8.3*  < > 8.5*  AST 22  --   --   ALT 12*  --   --   ALKPHOS 80  --   --   BILITOT 0.4  --   --   < > = values in this interval not displayed.  Cardiac Enzymes  Recent Labs Lab 12/17/15 1958  TROPONINI 0.10*    Microbiology Results  Results for orders placed or performed during the hospital encounter of 12/16/15  MRSA PCR Screening     Status: None   Collection Time: 12/18/15  3:22 AM  Result Value Ref Range Status   MRSA by PCR NEGATIVE NEGATIVE Final    Comment:        The GeneXpert MRSA Assay (FDA approved for NASAL specimens only), is one component of a comprehensive MRSA colonization surveillance program. It is not intended to diagnose MRSA infection nor to guide or monitor treatment for MRSA infections.   Gastrointestinal Panel by PCR , Stool     Status: Abnormal   Collection Time: 12/18/15 10:24 AM  Result Value Ref Range Status   Campylobacter species NOT DETECTED NOT DETECTED Final   Plesimonas shigelloides NOT DETECTED NOT DETECTED Final   Salmonella species NOT DETECTED NOT DETECTED Final   Yersinia  enterocolitica NOT DETECTED NOT DETECTED Final   Vibrio species NOT DETECTED NOT DETECTED Final   Vibrio cholerae NOT  DETECTED NOT DETECTED Final   Enteroaggregative E coli (EAEC) NOT DETECTED NOT DETECTED Final   Enteropathogenic E coli (EPEC) DETECTED (A) NOT DETECTED Final    Comment: CRITICAL RESULT CALLED TO, READ BACK BY AND VERIFIED WITH: ABIGAIL JACKSON,RN 12/19/2015 1249 BY JRS.    Enterotoxigenic E coli (ETEC) NOT DETECTED NOT DETECTED Final   Shiga like toxin producing E coli (STEC) NOT DETECTED NOT DETECTED Final   E. coli O157 NOT DETECTED NOT DETECTED Final   Shigella/Enteroinvasive E coli (EIEC) NOT DETECTED NOT DETECTED Final   Cryptosporidium NOT DETECTED NOT DETECTED Final   Cyclospora cayetanensis NOT DETECTED NOT DETECTED Final   Entamoeba histolytica NOT DETECTED NOT DETECTED Final   Giardia lamblia NOT DETECTED NOT DETECTED Final   Adenovirus F40/41 NOT DETECTED NOT DETECTED Final   Astrovirus NOT DETECTED NOT DETECTED Final   Norovirus GI/GII NOT DETECTED NOT DETECTED Final   Rotavirus A NOT DETECTED NOT DETECTED Final   Sapovirus (I, II, IV, and V) NOT DETECTED NOT DETECTED Final  C difficile quick scan w PCR reflex     Status: None   Collection Time: 12/18/15 10:24 AM  Result Value Ref Range Status   C Diff antigen NEGATIVE NEGATIVE Final   C Diff toxin NEGATIVE NEGATIVE Final   C Diff interpretation Negative for C. difficile  Final  CULTURE, BLOOD (ROUTINE X 2) w Reflex to PCR ID Panel     Status: None (Preliminary result)   Collection Time: 12/18/15  3:58 PM  Result Value Ref Range Status   Specimen Description BLOOD RIGHT WRIST  Final   Special Requests   Final    BOTTLES DRAWN AEROBIC AND ANAEROBIC  AER 5CC ANA [place]   Culture NO GROWTH 2 DAYS  Final   Report Status PENDING  Incomplete  CULTURE, BLOOD (ROUTINE X 2) w Reflex to PCR ID Panel     Status: None (Preliminary result)   Collection Time: 12/18/15  4:00 PM  Result Value Ref Range Status    Specimen Description BLOOD RIGHT HAND  Final   Special Requests BOTTLES DRAWN AEROBIC AND ANAEROBIC  [place]  Final   Culture NO GROWTH 2 DAYS  Final   Report Status PENDING  Incomplete    RADIOLOGY:  Dg Chest 2 View  12/19/2015  CLINICAL DATA:  Bronchitis, weakness, shortness of breath, COPD, hypertension, diabetes, coronary artery disease post MI/PTCA/CABG EXAM: CHEST  2 VIEW COMPARISON:  12/14/2015 FINDINGS: RIGHT jugular central venous catheter with tip projecting over SVC. Enlargement of cardiac silhouette. Tortuosity of thoracic aorta with atherosclerotic calcification. Bronchitic changes with subsegmental atelectasis LEFT upper lobe. No acute infiltrate, pleural effusion or pneumothorax. Bones appear demineralized. IMPRESSION: Enlargement of cardiac silhouette. Bronchitic changes with LEFT upper lobe subsegmental atelectasis. Electronically Signed   By: Lavonia Dana M.D.   On: 12/19/2015 16:51    EKG:   Orders placed or performed during the hospital encounter of 12/16/15  . ED EKG  . ED EKG  . EKG 12-Lead  . EKG 12-Lead      Management plans discussed with the patient, family and they are in agreement.  CODE STATUS: Full    Code Status Orders        Start     Ordered   12/17/15 0813  Full code   Continuous     12/17/15 0812    Code Status History    Date Active Date Inactive Code Status Order ID Comments User Context   This patient has a current  code status but no historical code status.      TOTAL TIME TAKING CARE OF THIS PATIENT: 35 minutes.    Vaughan Basta M.D on 12/20/2015 at 2:13 PM  Between 7am to 6pm - Pager - [phone]  After 6pm go to [email or web address] - password [email or web address] [facility]  Office  [phone]  CC: Primary care physician; Pcp Not In System   Note: This dictation was prepared with Dragon dictation along with smaller phrase technology. Any transcriptional errors that result from this process are unintentional.

## 2015-12-20 NOTE — Progress Notes (Signed)
Patient rested quietly tonight with no complaints of pain. Notified MD of AM potassium 3.3; orders for one time dose of 40 mEq PO potassium to be given at 0800 placed. A&Ox4, VSS, NSR on tele. Nursing staff will continue to monitor. Earleen Reaper, RN

## 2015-12-21 NOTE — Telephone Encounter (Signed)
Spoke with [name], in hospital for bronchitis. Just wanted Dr. Primus Bravo to know this.

## 2015-12-21 NOTE — Telephone Encounter (Signed)
Thank you :)

## 2015-12-23 LAB — CULTURE, BLOOD (ROUTINE X 2)
CULTURE: NO GROWTH
Culture: NO GROWTH

## 2015-12-28 ENCOUNTER — Other Ambulatory Visit: Payer: Self-pay | Admitting: Vascular Surgery

## 2015-12-29 ENCOUNTER — Encounter: Payer: Self-pay | Admitting: Pain Medicine

## 2015-12-29 ENCOUNTER — Ambulatory Visit: Payer: Medicare HMO | Attending: Pain Medicine | Admitting: Pain Medicine

## 2015-12-29 VITALS — BP 100/63 | HR 104 | Temp 98.1°F | Resp 18 | Ht 66.0 in | Wt 232.0 lb

## 2015-12-29 DIAGNOSIS — M170 Bilateral primary osteoarthritis of knee: Secondary | ICD-10-CM

## 2015-12-29 DIAGNOSIS — M51379 Other intervertebral disc degeneration, lumbosacral region without mention of lumbar back pain or lower extremity pain: Secondary | ICD-10-CM

## 2015-12-29 DIAGNOSIS — M545 Low back pain: Secondary | ICD-10-CM | POA: Diagnosis present

## 2015-12-29 DIAGNOSIS — M5137 Other intervertebral disc degeneration, lumbosacral region: Secondary | ICD-10-CM

## 2015-12-29 DIAGNOSIS — M25512 Pain in left shoulder: Secondary | ICD-10-CM | POA: Diagnosis present

## 2015-12-29 DIAGNOSIS — M5116 Intervertebral disc disorders with radiculopathy, lumbar region: Secondary | ICD-10-CM | POA: Insufficient documentation

## 2015-12-29 DIAGNOSIS — M19011 Primary osteoarthritis, right shoulder: Secondary | ICD-10-CM | POA: Diagnosis not present

## 2015-12-29 DIAGNOSIS — M47817 Spondylosis without myelopathy or radiculopathy, lumbosacral region: Secondary | ICD-10-CM

## 2015-12-29 DIAGNOSIS — M461 Sacroiliitis, not elsewhere classified: Secondary | ICD-10-CM

## 2015-12-29 DIAGNOSIS — M5126 Other intervertebral disc displacement, lumbar region: Secondary | ICD-10-CM | POA: Insufficient documentation

## 2015-12-29 DIAGNOSIS — M7060 Trochanteric bursitis, unspecified hip: Secondary | ICD-10-CM

## 2015-12-29 DIAGNOSIS — M1288 Other specific arthropathies, not elsewhere classified, other specified site: Secondary | ICD-10-CM | POA: Diagnosis not present

## 2015-12-29 DIAGNOSIS — Z96659 Presence of unspecified artificial knee joint: Secondary | ICD-10-CM | POA: Diagnosis not present

## 2015-12-29 DIAGNOSIS — M1610 Unilateral primary osteoarthritis, unspecified hip: Secondary | ICD-10-CM | POA: Diagnosis not present

## 2015-12-29 DIAGNOSIS — Z96653 Presence of artificial knee joint, bilateral: Secondary | ICD-10-CM

## 2015-12-29 DIAGNOSIS — M19012 Primary osteoarthritis, left shoulder: Secondary | ICD-10-CM | POA: Diagnosis not present

## 2015-12-29 DIAGNOSIS — M159 Polyosteoarthritis, unspecified: Secondary | ICD-10-CM

## 2015-12-29 DIAGNOSIS — M533 Sacrococcygeal disorders, not elsewhere classified: Secondary | ICD-10-CM

## 2015-12-29 DIAGNOSIS — M25511 Pain in right shoulder: Secondary | ICD-10-CM | POA: Diagnosis present

## 2015-12-29 DIAGNOSIS — M5416 Radiculopathy, lumbar region: Secondary | ICD-10-CM

## 2015-12-29 DIAGNOSIS — M150 Primary generalized (osteo)arthritis: Secondary | ICD-10-CM

## 2015-12-29 DIAGNOSIS — M5127 Other intervertebral disc displacement, lumbosacral region: Secondary | ICD-10-CM | POA: Diagnosis not present

## 2015-12-29 DIAGNOSIS — M172 Bilateral post-traumatic osteoarthritis of knee: Secondary | ICD-10-CM

## 2015-12-29 DIAGNOSIS — M160 Bilateral primary osteoarthritis of hip: Secondary | ICD-10-CM

## 2015-12-29 MED ORDER — OXYCODONE HCL 10 MG PO TABS: ORAL_TABLET | ORAL | Status: DC

## 2015-12-29 NOTE — Progress Notes (Signed)
Admitted to hospital last week for URI.

## 2015-12-29 NOTE — Progress Notes (Signed)
Subjective:    Patient ID: Kimberly Shaffer, female    DOB: Aug 25, 1951, 65 y.o.   MRN: [id number]  HPI  The patient is a 65 year old female who returns to pain management for further evaluation and treatment of pain involving the lower back lower extremity region as well as shoulders and knees. At the present time patient is to undergo surgery for revision of her fistula. The patient states that she has infection in the region of the fistula and was undergo surgery at this time . The patient stated that her lumbar and lower extremity pain was fairly well-controlled and pain in the knees were without significant interference of activities of daily living. Patient was with tenderness to palpation in the region of the shoulders with some pain with region lifting pushing pulling occurring in the region of the shoulders. We informed patient that we will avoid interventional treatment palpation undergo's revision surgery of her fistula. We will consider patient for interventional treatment and as well as additional modifications of treatment regimen pending follow-up evaluation. All agreed with suggested treatment plan.     Review of Systems     Objective:   Physical Exam  There was tenderness to palpation of paraspinal muscular treat the cervical region cervical facet region with palpation over this splenius capitis and occipitalis region reproducing moderate discomfort. Palpation over the region of the thoracic facet thoracic paraspinal musculature region was attends to palpation of moderate degree. There was no crepitus of the thoracic region noted. Palpation of the acromioclavicular and glenohumeral joint region was with moderate discomfort. The patient appeared to be with unremarkable Spurling's maneuver. The patient was with limited range of motion of the shoulder. Tinel and Phalen's maneuver was associated with moderate discomfort and patient was with slightly decreased grip strength. Patient was  with fistula of the left upper extremity There was tenderness over the lumbar paraspinal must reason lumbar facet region with lateral bending rotation extension and palpation of the lumbar facets reproducing moderate discomfort. There was well-healed surgical scars of the knees without increased warmth erythema in the region of the knees. EHL strength appeared to be decreased. There was negative clonus negative Homans. No definite sensory deficit or dermatomal dystrophy detected. Palpation over the PSIS and PII S regions reproduce moderate discomfort. Abdomen was nontender with no costovertebral tenderness noted      Assessment & Plan:    Degenerative joint disease of shoulders  Degenerative joint disease of hip  Degenerative joint disease of knees  Status post total knee replacement  Degenerative disc disease lumbar spine   L1 to, L2-3, L3-4, L4-5, and L5-S1 with multilevel degenerative disc disease, disc bulging, broad-based disc bulging, left paracentral disc protrusion, superior margination of disc material, bilateral facet arthropathy, L1-2 and L2-3 and L3-4 with bilateral facet arthropathy as well as L4-5 and L5-S1.   Lumbar radiculopathy  Lumbar facet syndrome      PLAN   Continue present medication oxycodone  F/U PCP Dr. Julieanne Cotton  for evaliation of  BP and general medical  condition  F/U surgical evaluation as discussed. Patient will follow-up with ENT as discussed and May consider additional surgical evaluations  F/U neurological evaluation. May consider  F/U with vascular surgery for revision of fistula placement for hemodialysis as planned  Appointment with Dr. Holley Raring  as planned  F/U Drs. [place] as discussed  May consider radiofrequency rhizolysis or intraspinal procedures pending response to present treatment and F/U evaluation . We will avoid  such procedures at this time  Patient to call [facility] should patient  have concerns prior to scheduled return appointment.   Renal disease with hemodialysis

## 2015-12-29 NOTE — Patient Instructions (Addendum)
PLAN   Continue present medication oxycodone  F/U PCP Dr. Julieanne Cotton  for evaliation of  BP and general medical  condition  F/U surgical evaluation as discussed. Patient will follow-up with ENT as discussed and May consider additional surgical evaluations  F/U neurological evaluation. May consider  F/U with vascular surgery for revision of fistula placement for hemodialysis as planned  Appointment with Dr. Holley Raring  as planned  F/U Drs. [place] as discussed  May consider radiofrequency rhizolysis or intraspinal procedures pending response to present treatment and F/U evaluation . We will avoid such procedures at this time  Patient to call [facility] Center should patient have concerns prior to scheduled return appointment.

## 2015-12-31 ENCOUNTER — Ambulatory Visit
Admission: RE | Admit: 2015-12-31 | Discharge: 2015-12-31 | Disposition: A | Discharge status: Home / Self Care | Payer: Medicare HMO | Source: Ambulatory Visit | Attending: Vascular Surgery | Admitting: Vascular Surgery

## 2015-12-31 ENCOUNTER — Encounter
Admission: RE | Disposition: A | Discharge status: Home / Self Care | Payer: Self-pay | Source: Ambulatory Visit | Attending: Vascular Surgery

## 2015-12-31 ENCOUNTER — Encounter: Payer: Self-pay | Admitting: Vascular Surgery

## 2015-12-31 DIAGNOSIS — M4806 Spinal stenosis, lumbar region: Secondary | ICD-10-CM | POA: Insufficient documentation

## 2015-12-31 DIAGNOSIS — Z809 Family history of malignant neoplasm, unspecified: Secondary | ICD-10-CM | POA: Diagnosis not present

## 2015-12-31 DIAGNOSIS — Z833 Family history of diabetes mellitus: Secondary | ICD-10-CM | POA: Diagnosis not present

## 2015-12-31 DIAGNOSIS — Z8249 Family history of ischemic heart disease and other diseases of the circulatory system: Secondary | ICD-10-CM | POA: Diagnosis not present

## 2015-12-31 DIAGNOSIS — F1021 Alcohol dependence, in remission: Secondary | ICD-10-CM | POA: Diagnosis not present

## 2015-12-31 DIAGNOSIS — Z992 Dependence on renal dialysis: Secondary | ICD-10-CM | POA: Insufficient documentation

## 2015-12-31 DIAGNOSIS — N186 End stage renal disease: Secondary | ICD-10-CM | POA: Diagnosis not present

## 2015-12-31 DIAGNOSIS — E669 Obesity, unspecified: Secondary | ICD-10-CM | POA: Diagnosis not present

## 2015-12-31 DIAGNOSIS — Z79899 Other long term (current) drug therapy: Secondary | ICD-10-CM | POA: Insufficient documentation

## 2015-12-31 DIAGNOSIS — T82858A Stenosis of vascular prosthetic devices, implants and grafts, initial encounter: Secondary | ICD-10-CM | POA: Diagnosis present

## 2015-12-31 DIAGNOSIS — Z6834 Body mass index (BMI) 34.0-34.9, adult: Secondary | ICD-10-CM | POA: Diagnosis not present

## 2015-12-31 DIAGNOSIS — E785 Hyperlipidemia, unspecified: Secondary | ICD-10-CM | POA: Insufficient documentation

## 2015-12-31 DIAGNOSIS — F17200 Nicotine dependence, unspecified, uncomplicated: Secondary | ICD-10-CM | POA: Insufficient documentation

## 2015-12-31 DIAGNOSIS — I998 Other disorder of circulatory system: Secondary | ICD-10-CM | POA: Diagnosis not present

## 2015-12-31 DIAGNOSIS — Z8679 Personal history of other diseases of the circulatory system: Secondary | ICD-10-CM | POA: Diagnosis not present

## 2015-12-31 DIAGNOSIS — Z882 Allergy status to sulfonamides status: Secondary | ICD-10-CM | POA: Insufficient documentation

## 2015-12-31 DIAGNOSIS — Z955 Presence of coronary angioplasty implant and graft: Secondary | ICD-10-CM | POA: Diagnosis not present

## 2015-12-31 DIAGNOSIS — I10 Essential (primary) hypertension: Secondary | ICD-10-CM | POA: Insufficient documentation

## 2015-12-31 DIAGNOSIS — I639 Cerebral infarction, unspecified: Secondary | ICD-10-CM | POA: Diagnosis not present

## 2015-12-31 DIAGNOSIS — I6529 Occlusion and stenosis of unspecified carotid artery: Secondary | ICD-10-CM | POA: Diagnosis not present

## 2015-12-31 DIAGNOSIS — Y832 Surgical operation with anastomosis, bypass or graft as the cause of abnormal reaction of the patient, or of later complication, without mention of misadventure at the time of the procedure: Secondary | ICD-10-CM | POA: Diagnosis not present

## 2015-12-31 DIAGNOSIS — R0989 Other specified symptoms and signs involving the circulatory and respiratory systems: Secondary | ICD-10-CM | POA: Diagnosis not present

## 2015-12-31 DIAGNOSIS — E1122 Type 2 diabetes mellitus with diabetic chronic kidney disease: Secondary | ICD-10-CM | POA: Insufficient documentation

## 2015-12-31 DIAGNOSIS — Z888 Allergy status to other drugs, medicaments and biological substances status: Secondary | ICD-10-CM | POA: Insufficient documentation

## 2015-12-31 DIAGNOSIS — I1311 Hypertensive heart and chronic kidney disease without heart failure, with stage 5 chronic kidney disease, or end stage renal disease: Secondary | ICD-10-CM | POA: Diagnosis not present

## 2015-12-31 DIAGNOSIS — J439 Emphysema, unspecified: Secondary | ICD-10-CM | POA: Diagnosis not present

## 2015-12-31 HISTORY — PX: PERIPHERAL VASCULAR CATHETERIZATION: SHX172C

## 2015-12-31 LAB — POTASSIUM (ARMC VASCULAR LAB ONLY): Potassium (ARMC vascular lab): 2.9 — CL (ref 3.5–5.1)

## 2015-12-31 SURGERY — A/V SHUNTOGRAM/FISTULAGRAM
Anesthesia: Moderate Sedation | Site: Arm Upper

## 2015-12-31 MED ORDER — METHYLPREDNISOLONE SODIUM SUCC 125 MG IJ SOLR
125.0000 mg | INTRAMUSCULAR | Status: DC | PRN
Start: 2015-12-31 — End: 2015-12-31

## 2015-12-31 MED ORDER — IOPAMIDOL (ISOVUE-300) INJECTION 61%
INTRAVENOUS | Status: DC | PRN
  Administered 2015-12-31: 35 mL via INTRAVENOUS

## 2015-12-31 MED ORDER — HYDROMORPHONE HCL 1 MG/ML IJ SOLN
1.0000 mg | Freq: Once | INTRAMUSCULAR | Status: DC
Start: 2015-12-31 — End: 2015-12-31

## 2015-12-31 MED ORDER — SODIUM CHLORIDE 0.9 % IV SOLN
INTRAVENOUS | Status: DC
  Administered 2015-12-31: 11:00:00 via INTRAVENOUS

## 2015-12-31 MED ORDER — MIDAZOLAM HCL 2 MG/2ML IJ SOLN
INTRAMUSCULAR | Status: AC
  Filled 2015-12-31: qty 4

## 2015-12-31 MED ORDER — DEXTROSE 5 % IV SOLN: 1.5000 g | INTRAVENOUS | Status: DC

## 2015-12-31 MED ORDER — FENTANYL CITRATE (PF) 100 MCG/2ML IJ SOLN
INTRAMUSCULAR | Status: AC
  Filled 2015-12-31: qty 2

## 2015-12-31 MED ORDER — ONDANSETRON HCL 4 MG/2ML IJ SOLN: 4.0000 mg | Freq: Four times a day (QID) | INTRAMUSCULAR | Status: DC | PRN

## 2015-12-31 MED ORDER — HEPARIN SODIUM (PORCINE) 1000 UNIT/ML IJ SOLN
INTRAMUSCULAR | Status: AC
  Filled 2015-12-31: qty 1

## 2015-12-31 MED ORDER — FENTANYL CITRATE (PF) 100 MCG/2ML IJ SOLN
INTRAMUSCULAR | Status: DC | PRN
  Administered 2015-12-31: 50 ug via INTRAVENOUS

## 2015-12-31 MED ORDER — LIDOCAINE-EPINEPHRINE (PF) 1 %-1:200000 IJ SOLN
INTRAMUSCULAR | Status: AC
  Filled 2015-12-31: qty 30

## 2015-12-31 MED ORDER — MIDAZOLAM HCL 2 MG/2ML IJ SOLN
INTRAMUSCULAR | Status: DC | PRN
Start: 2015-12-31 — End: 2015-12-31
  Administered 2015-12-31: 2 mg via INTRAVENOUS

## 2015-12-31 MED ORDER — HEPARIN SODIUM (PORCINE) 1000 UNIT/ML IJ SOLN
INTRAMUSCULAR | Status: DC | PRN
  Administered 2015-12-31: 3000 [IU] via INTRAVENOUS

## 2015-12-31 MED ORDER — HEPARIN (PORCINE) IN NACL 2-0.9 UNIT/ML-% IJ SOLN
INTRAMUSCULAR | Status: AC
  Filled 2015-12-31: qty 1000

## 2015-12-31 MED ORDER — FAMOTIDINE 20 MG PO TABS: 40.0000 mg | ORAL_TABLET | ORAL | Status: DC | PRN

## 2015-12-31 SURGICAL SUPPLY — 14 items
BALLN ARMADA 8X60X80 (BALLOONS) ×4
BALLN DORADO 7X60X80 (BALLOONS) ×4
BALLOON ARMADA 8X60X80 (BALLOONS) ×2 IMPLANT
BALLOON DORADO 7X60X80 (BALLOONS) ×2 IMPLANT
CANNULA 5F STIFF (CANNULA) ×4 IMPLANT
CATH TORCON 5FR 0.38 (CATHETERS) ×4 IMPLANT
DEVICE PRESTO INFLATION (MISCELLANEOUS) ×4 IMPLANT
DEVICE TORQUE (MISCELLANEOUS) ×4 IMPLANT
DRAPE BRACHIAL (DRAPES) ×4 IMPLANT
GLIDEWIRE STIFF .35X180X3 HYDR (WIRE) ×4 IMPLANT
PACK ANGIOGRAPHY (CUSTOM PROCEDURE TRAY) ×4 IMPLANT
SHEATH BRITE TIP 6FRX5.5 (SHEATH) ×4 IMPLANT
TOWEL OR 17X26 4PK STRL BLUE (TOWEL DISPOSABLE) ×4 IMPLANT
WIRE MAGIC TOR.035 180C (WIRE) ×4 IMPLANT

## 2015-12-31 NOTE — Op Note (Signed)
[facility]    OPERATIVE NOTE   PROCEDURE: 1.   Left brachiocephalic arteriovenous fistula cannulation under ultrasound guidance 2.   Left arm fistulagram including central venogram 3.   Percutaneous transluminal angioplasty of the distal upper arm cephalic vein with 7 mm and 8 mm diameter angioplasty balloon  PRE-OPERATIVE DIAGNOSIS: 1. ESRD 2. Poorly functional left brachiocephalic AVF  POST-OPERATIVE DIAGNOSIS: same as above   SURGEON: Leotis Pain, MD  ANESTHESIA: local with MCS  ESTIMATED BLOOD LOSS: 25 cc  FINDING(S): 1. 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 female who presents with malfunctioning  left brachiocephalic arteriovenous fistula.  The patient is scheduled for  left arm fistulagram.  The patient is aware the risks include but are not limited to: bleeding, infection, thrombosis of the cannulated access, and possible anaphylactic reaction to the contrast.  The patient is aware of the risks of the procedure and elects to proceed forward.  DESCRIPTION: After full informed written consent was obtained, the patient was brought back to the angiography suite and placed supine upon the angiography table.  The patient was connected to monitoring equipment. Moderate conscious sedation was administered with a face to face encounter with the patient throughout the procedure with my supervision of the RN administering medicines and monitoring the patient's vital signs and mental status throughout from the start of the procedure until the patient was taken to the recovery room. The  left arm was prepped and draped in the standard fashion for a percutaneous access intervention.  Under ultrasound guidance, the  left  brachiocephalic arteriovenous fistula was cannulated with a micropuncture needle under direct ultrasound guidance and a permanent image was performed.  The microwire was advanced into the fistula and the needle was exchanged for the a microsheath.  I then upsized to a 6 Fr Sheath and imaging was performed.  Hand injections were completed to image the access including the central venous system. This demonstrated a high-grade stenosis in the 85-90% range in the cephalic vein in the mid to distal upper arm 10-12 cm beyond the anastomosis.  Based on the images, this patient will need treatment of this area. There was also a moderate-sized branch in near proximity to the stenosis. I then gave the patient 3000 units of intravenous heparin.  I then crossed the stenosis with a Magic Tourqe wire.  Based on the imaging, a 7 mm x 6 cm  high pressure angioplasty balloon was selected.  The balloon was centered around the cephalic vein stenosis and inflated to 10 ATM for 1 minute(s).  On completion imaging, a 50 % residual stenosis was present.  I then elected to upsized to an 8 mm diameter by 6 cm length angioplasty balloon. This was centered around the cephalic vein stenosis and inflated to 8 atm for 1 minute. Completion angiogram showed about a 20-25% residual stenosis which was not flow limiting.   Based on the completion imaging, no further intervention is necessary.  The wire and balloon were removed from the sheath.  A 4-0 Monocryl purse-string suture was sewn around the sheath.  The sheath was removed while tying down the suture.  A sterile bandage was applied to the puncture site.  COMPLICATIONS: None  CONDITION: Stable   Kimberly Shaffer  12/31/2015 9:57 AM

## 2015-12-31 NOTE — Progress Notes (Signed)
Pt clinically stable post procedure, eating breakfast, Dr Lucky Cowboy out to speak with pt. Questions answered , return appt. Given,

## 2015-12-31 NOTE — H&P (Signed)
  [facility] History & Physical Update  The patient was interviewed and re-examined.  The patient's previous History and Physical has been reviewed and is unchanged.  There is no change in the plan of care. We plan to proceed with the scheduled procedure.  Ishmeal Rorie, MD  12/31/2015, 8:46 AM

## 2015-12-31 NOTE — Discharge Instructions (Signed)
Fistulogram, Care After °Refer to this sheet in the next few weeks. These instructions provide you with information on caring for yourself after your procedure. Your health care provider may also give you more specific instructions. Your treatment has been planned according to current medical practices, but problems sometimes occur. Call your health care provider if you have any problems or questions after your procedure. °WHAT TO EXPECT AFTER THE PROCEDURE °After your procedure, it is typical to have the following: °· A small amount of discomfort in the area where the catheters were placed. °· A small amount of bruising around the fistula. °· Sleepiness and fatigue. °HOME CARE INSTRUCTIONS °· Rest at home for the day following your procedure. °· Do not drive or operate heavy machinery while taking pain medicine. °· Take medicines only as directed by your health care provider. °· Do not take baths, swim, or use a hot tub until your health care provider approves. You may shower 24 hours after the procedure or as directed by your health care provider. °· There are many different ways to close and cover an incision, including stitches, skin glue, and adhesive strips. Follow your health care provider's instructions on: °¨ Incision care. °¨ Bandage (dressing) changes and removal. °¨ Incision closure removal. °· Monitor your dialysis fistula carefully. °SEEK MEDICAL CARE IF: °· You have drainage, redness, swelling, or pain at your catheter site. °· You have a fever. °· You have chills. °SEEK IMMEDIATE MEDICAL CARE IF: °· You feel weak. °· You have trouble balancing. °· You have trouble moving your arms or legs. °· You have problems with your speech or vision. °· You can no longer feel a vibration or buzz when you put your fingers over your dialysis fistula. °· The limb that was used for the procedure: °¨ Swells. °¨ Is painful. °¨ Is cold. °¨ Is discolored, such as blue or pale white. °  °This information is not intended  to replace advice given to you by your health care provider. Make sure you discuss any questions you have with your health care provider. °  °Document Released: 01/20/2014 Document Reviewed: 01/20/2014 °Elsevier Interactive Patient Education ©2016 [employer] ° °

## 2016-01-05 NOTE — Telephone Encounter (Signed)
Thank you :)

## 2016-01-18 ENCOUNTER — Ambulatory Visit: Payer: Medicare HMO | Attending: Pain Medicine | Admitting: Pain Medicine

## 2016-01-18 ENCOUNTER — Ambulatory Visit: Payer: Medicare HMO | Admitting: Pain Medicine

## 2016-01-18 ENCOUNTER — Encounter: Payer: Self-pay | Admitting: Pain Medicine

## 2016-01-18 DIAGNOSIS — M7060 Trochanteric bursitis, unspecified hip: Secondary | ICD-10-CM

## 2016-01-18 DIAGNOSIS — Z96653 Presence of artificial knee joint, bilateral: Secondary | ICD-10-CM

## 2016-01-18 DIAGNOSIS — M5136 Other intervertebral disc degeneration, lumbar region: Secondary | ICD-10-CM | POA: Diagnosis not present

## 2016-01-18 DIAGNOSIS — M47817 Spondylosis without myelopathy or radiculopathy, lumbosacral region: Secondary | ICD-10-CM

## 2016-01-18 DIAGNOSIS — M19011 Primary osteoarthritis, right shoulder: Secondary | ICD-10-CM

## 2016-01-18 DIAGNOSIS — M545 Low back pain: Secondary | ICD-10-CM | POA: Diagnosis present

## 2016-01-18 DIAGNOSIS — M5126 Other intervertebral disc displacement, lumbar region: Secondary | ICD-10-CM | POA: Diagnosis not present

## 2016-01-18 DIAGNOSIS — M160 Bilateral primary osteoarthritis of hip: Secondary | ICD-10-CM

## 2016-01-18 DIAGNOSIS — M1288 Other specific arthropathies, not elsewhere classified, other specified site: Secondary | ICD-10-CM | POA: Diagnosis not present

## 2016-01-18 DIAGNOSIS — M19012 Primary osteoarthritis, left shoulder: Secondary | ICD-10-CM

## 2016-01-18 DIAGNOSIS — M5416 Radiculopathy, lumbar region: Secondary | ICD-10-CM

## 2016-01-18 DIAGNOSIS — M159 Polyosteoarthritis, unspecified: Secondary | ICD-10-CM

## 2016-01-18 DIAGNOSIS — M172 Bilateral post-traumatic osteoarthritis of knee: Secondary | ICD-10-CM

## 2016-01-18 DIAGNOSIS — M461 Sacroiliitis, not elsewhere classified: Secondary | ICD-10-CM

## 2016-01-18 DIAGNOSIS — M533 Sacrococcygeal disorders, not elsewhere classified: Secondary | ICD-10-CM

## 2016-01-18 DIAGNOSIS — M150 Primary generalized (osteo)arthritis: Secondary | ICD-10-CM

## 2016-01-18 DIAGNOSIS — M170 Bilateral primary osteoarthritis of knee: Secondary | ICD-10-CM

## 2016-01-18 DIAGNOSIS — M5137 Other intervertebral disc degeneration, lumbosacral region: Secondary | ICD-10-CM

## 2016-01-18 MED ORDER — ORPHENADRINE CITRATE 30 MG/ML IJ SOLN: 60.0000 mg | Freq: Once | INTRAMUSCULAR | Status: DC

## 2016-01-18 MED ORDER — TRIAMCINOLONE ACETONIDE 40 MG/ML IJ SUSP
INTRAMUSCULAR | Status: AC
  Administered 2016-01-18: 15:00:00
  Filled 2016-01-18: qty 1

## 2016-01-18 MED ORDER — MIDAZOLAM HCL 5 MG/5ML IJ SOLN: 5.0000 mg | Freq: Once | INTRAMUSCULAR | Status: DC

## 2016-01-18 MED ORDER — FENTANYL CITRATE (PF) 100 MCG/2ML IJ SOLN: 100.0000 ug | Freq: Once | INTRAMUSCULAR | Status: DC

## 2016-01-18 MED ORDER — BUPIVACAINE HCL (PF) 0.25 % IJ SOLN
INTRAMUSCULAR | Status: AC
  Administered 2016-01-18: 15:00:00
  Filled 2016-01-18: qty 30

## 2016-01-18 MED ORDER — ORPHENADRINE CITRATE 30 MG/ML IJ SOLN
INTRAMUSCULAR | Status: AC
  Administered 2016-01-18: 15:00:00
  Filled 2016-01-18: qty 2

## 2016-01-18 MED ORDER — FENTANYL CITRATE (PF) 100 MCG/2ML IJ SOLN
INTRAMUSCULAR | Status: AC
  Administered 2016-01-18: 50 ug
  Filled 2016-01-18: qty 2

## 2016-01-18 MED ORDER — MIDAZOLAM HCL 5 MG/5ML IJ SOLN
INTRAMUSCULAR | Status: AC
  Administered 2016-01-18: 2 mg
  Filled 2016-01-18: qty 5

## 2016-01-18 MED ORDER — LACTATED RINGERS IV SOLN: 1000.0000 mL | INTRAVENOUS | Status: DC

## 2016-01-18 MED ORDER — CEFAZOLIN SODIUM 1 G IJ SOLR
INTRAMUSCULAR | Status: AC
  Administered 2016-01-18: 15:00:00
  Filled 2016-01-18: qty 10

## 2016-01-18 MED ORDER — LIDOCAINE HCL (PF) 1 % IJ SOLN: 10.0000 mL | Freq: Once | INTRAMUSCULAR | Status: DC

## 2016-01-18 MED ORDER — CEFUROXIME AXETIL 250 MG PO TABS: 250.0000 mg | ORAL_TABLET | Freq: Two times a day (BID) | ORAL | Status: DC

## 2016-01-18 MED ORDER — CEFAZOLIN SODIUM 1-5 GM-% IV SOLN: 1.0000 g | Freq: Once | INTRAVENOUS | Status: DC

## 2016-01-18 MED ORDER — BUPIVACAINE HCL (PF) 0.25 % IJ SOLN: 30.0000 mL | Freq: Once | INTRAMUSCULAR | Status: DC

## 2016-01-18 MED ORDER — TRIAMCINOLONE ACETONIDE 40 MG/ML IJ SUSP: 40.0000 mg | Freq: Once | INTRAMUSCULAR | Status: DC

## 2016-01-18 NOTE — Progress Notes (Signed)
Safety precautions to be maintained throughout the outpatient stay will include: orient to surroundings, keep bed in low position, maintain call bell within reach at all times, provide assistance with transfer out of bed and ambulation.  

## 2016-01-18 NOTE — Patient Instructions (Addendum)
PLAN   Continue present medication oxycodone. Please get Ceftin antibiotic and begin taking Ceftin antibiotic today as prescribed  F/U PCP Dr. Julieanne Cotton  for evaliation of  BP and general medical  Condition. Please see Dr.Abrill by Tuesday 01/19/2016 for evaluation of blood pressure as we discussed   F/U surgical evaluation as discussed.  F/U neurological evaluation May consider PNCV/EMG studies and other studies  F/U Drs. [place] and Parachos   May consider radiofrequency rhizolysis or intraspinal procedures pending response to present treatment and F/U evaluation   Patient to call Pain Management Center should patient have concerns prior to scheduled return appointment  Pain Management Discharge Instructions  General Discharge Instructions :  If you need to reach your doctor call: Monday-Friday 8:00 am - 4:00 pm at [phone] or toll free [phone].  After clinic hours [phone] to have operator reach doctor.  Bring all of your medication bottles to all your appointments in the pain clinic.  To cancel or reschedule your appointment with Pain Management please remember to call 24 hours in advance to avoid a fee.  Refer to the educational materials which you have been given on: General Risks, I had my Procedure. Discharge Instructions, Post Sedation.  Post Procedure Instructions:  The drugs you were given will stay in your system until tomorrow, so for the next 24 hours you should not drive, make any legal decisions or drink any alcoholic beverages.  You may eat anything you prefer, but it is better to start with liquids then soups and crackers, and gradually work up to solid foods.  Please notify your doctor immediately if you have any unusual bleeding, trouble breathing or pain that is not related to your normal pain.  Depending on the type of procedure that was done, some parts of your body may feel week and/or numb.  This usually clears up by tonight  or the next day.  Walk with the use of an assistive device or accompanied by an adult for the 24 hours.  You may use ice on the affected area for the first 24 hours.  Put ice in a Ziploc bag and cover with a towel and place against area 15 minutes on 15 minutes off.  You may switch to heat after 24 hours.  A prescription for CEFTIN was sent to your pharmacy and should be available for pickup today.

## 2016-01-18 NOTE — Progress Notes (Signed)
Subjective:    Patient ID: Kimberly Shaffer, female    DOB: 06-18-1951, 65 y.o.   MRN: [id number]  HPI  PROCEDURE PERFORMED: Lumbosacral selective nerve root block   NOTE: The patient is a 65 y.o. female who returns to Kimberly Shaffer for further evaluation and treatment of pain involving the lumbar and lower extremity region. Studies consisting of MRI has revealed the patient to be with evidence of  L1 to, L2-3, L3-4, L4-5, and L5-S1 with multilevel degenerative disc disease, disc bulging, broad-based disc bulging, left paracentral disc protrusion, superior margination of disc material, bilateral facet arthropathy, L1-2 and L2-3 and L3-4 with bilateral facet arthropathy as well as L4-5 and L5-S1. Marland Kitchen There is concern regarding intraspinal abnormalities contributing to the patient's symptomatology with concern regarding component of pain due to lumbar radiculopathy. The risks, benefits, and expectations of the procedure have been explained to the patient who was understanding and in agreement with suggested treatment plan. We will proceed with interventional treatment as discussed and as explained to the patient. The patient is understanding and in agreement with suggested treatment plan.   DESCRIPTION OF PROCEDURE: Lumbosacral selective nerve root block with IV Versed, IV fentanyl conscious sedation, EKG, blood pressure, pulse, capnography, and pulse oximetry monitoring. The procedure was performed with the patient in the prone position under fluoroscopic guidance. With the patient in the prone position, Betadine prep of proposed entry site was performed. Local anesthetic skin wheal of proposed needle entry site was prepared with 1.5% plain lidocaine with AP view of the lumbosacral spine.   PROCEDURE #1: Needle placement at the right L 2 vertebral body: A 22 -gauge needle was inserted at the inferior border of the transverse process of the vertebral body with needle placed medial to the midline of  the transverse process on AP view of the lumbosacral spine.   NEEDLE PLACEMENT AT L3, L4, AND L5  VERTEBRAL BODY LEVELS  Needle  placement was accomplished at L3, L4, and L5  vertebral body levels on the right side exactly as was accomplished at the L2  vertebral body level  and utilizing the same technique and under fluoroscopic guidance.    Needle placement was then verified on lateral view at all levels with needle tip documented to be in the posterior superior quadrant of the intervertebral foramen of  L 2, L3, L4, and L5. Following negative aspiration for heme and CSF at each level, each level was injected with 3 mL of 0.25% bupivacaine with Kenalog.    The patient tolerated the procedure well. A total of 10 mg of Kenalog was utilized for the procedure.   PLAN:  1. Medications: Will continue presently prescribed medication code on 2. The patient is to undergo follow-up evaluation with PCP Dr.Abrill follow with cardiologist or pulmonary physician as we discussed today for evaluation of blood pressure and general medical condition status post procedure performed on today's visit . The patient will follow-up with physician Tuesday, 01/19/2016  3. Surgical follow-up evaluation.Has been addressed  4. Neurological evaluation.May consider PNCV EMG studies and other studies  5. May consider radiofrequency procedures, implantation type procedures and other treatment pending response to treatment and follow-up evaluation. 6. The patient has been advise do adhere to proper body mechanics and avoid activities which may aggravate condition. The patient has been advised to call the Pain Management Center prior to scheduled return appointment should there be significant change in the patient's condition or should the patient have other concerns regarding condition  prior to scheduled return appointment.   Review of Systems     Objective:   Physical Exam        Assessment & Plan:

## 2016-01-19 ENCOUNTER — Ambulatory Visit (INDEPENDENT_AMBULATORY_CARE_PROVIDER_SITE_OTHER): Payer: Medicare HMO | Admitting: Internal Medicine

## 2016-01-19 ENCOUNTER — Telehealth: Payer: Self-pay

## 2016-01-19 ENCOUNTER — Encounter: Payer: Self-pay | Admitting: Internal Medicine

## 2016-01-19 VITALS — BP 140/66 | HR 79 | Ht 66.5 in | Wt 229.0 lb

## 2016-01-19 DIAGNOSIS — J438 Other emphysema: Secondary | ICD-10-CM

## 2016-01-19 MED ORDER — IPRATROPIUM BROMIDE 0.02 % IN SOLN: 0.5000 mg | Freq: Four times a day (QID) | RESPIRATORY_TRACT | Status: DC | PRN

## 2016-01-19 MED ORDER — FLUTICASONE-SALMETEROL 250-50 MCG/DOSE IN AEPB: 1.0000 | INHALATION_SPRAY | Freq: Two times a day (BID) | RESPIRATORY_TRACT | Status: DC

## 2016-01-19 NOTE — Addendum Note (Signed)
Addended by: Maryanna Shape A on: 01/19/2016 02:39 PM   Modules accepted: Orders

## 2016-01-19 NOTE — Progress Notes (Addendum)
[facility] Consultation      Assessment and Plan:  COPD. --Recommend that she continue to use proair and will add Advair 250 bid.  --Will start on nebulizer and ipratropium tid.  --CXR and PFT before next visit.   Nicotine abuse. --Discussed the importance of smoking cessation.   End-stage renal disease, currently on hemodialysis. --May be contributing to dyspnea due.  History of stroke and Coronary artery disease.  Dyspnea.  -Multifactorial due to COPD, deconditioning, obesity.    Date: 01/19/2016  MRN# [id number] RHYLEY GWATHNEY 03-Oct-1950  Referring Physician: Dr. Jeralyn Ruths is a 65 y.o. old female seen in consultation for chief complaint of:    Chief Complaint  Patient presents with  . pulmonary consult    pt ref by [facility]. pt c/o sob, prod cough gray in color, wheezing, chest tightness X5y     HPI:   The patient is a 65 year old female with a history of smoking, stroke, chronic kidney disease, currently being transitioned to hemodialysis. She was recently discharged from the hospital on April 2 of this year with symptoms of chest pain and acute bronchitis. At that time it was noted that she had a history of COPD, stroke, renal insufficiency, coronary artery disease. Since getting out of the hospital, her breathing has improved, but she still feels winded such as walking. She walks with a cart at the supermarket but slowly and will sometimes will use a motorized cart.  She has trouble getting up steps at church. She is currently in a wheelchair as she does not like to do much walking as she get winded and her legs get tired.   She is currently using albuterol MDI 2 to 3 times per day, and feels that it helps occasionally.  She is smoking about 8 cigs per day, she used to smoke 1 ppd. She is not really serious about quitting at this time.   Beginning 02 sat was 94% and HR 82. After walking 300 feet at slow pace, sat was 94%  and HR 94. Gait was wide, slow. She had moderate dyspnea and was conversational.    PMHX:   Past Medical History  Diagnosis Date  . Myocardial infarction ([place])   . COPD (chronic obstructive pulmonary disease) ([facility])   . Hypertension   . Obesity   . Depression   . Hypercholesteremia   . H/O blood clots   . Stroke ([place])   . H/O angina pectoris   . CAD (coronary artery disease)   . Diabetes mellitus without complication ([facility])     NIDD  . Vertigo, aural   . Renal insufficiency   . Asthma    Surgical Hx:  Past Surgical History  Procedure Laterality Date  . Cardiac catheterization    . Coronary stent placement    . Knee surgery Bilateral   . Replacement total knee bilateral Bilateral   . Coronary artery bypass graft  2012  . Joint replacement    . Av fistula placement Left 11-04-2015  . Av fistula placement Left 11/04/2015    Procedure: ARTERIOVENOUS (AV) FISTULA CREATION  ( BRACHIAL CEPHALIC );  Surgeon: Katha Cabal, MD;  Location: [facility];  Service: Vascular;  Laterality: Left;  . Peripheral vascular catheterization N/A 11/13/2015    Procedure: Dialysis/Perma Catheter Insertion;  Surgeon: Katha Cabal, MD;  Location: [facility];  Service: Cardiovascular;  Laterality: N/A;  . Peripheral vascular catheterization N/A 12/04/2015    Procedure:  Dialysis/Perma Catheter Insertion;  Surgeon: Katha Cabal, MD;  Location: [facility];  Service: Cardiovascular;  Laterality: N/A;  . Peripheral vascular catheterization Left 12/31/2015    Procedure: A/V Shuntogram/Fistulagram;  Surgeon: Algernon Huxley, MD;  Location: [facility];  Service: Cardiovascular;  Laterality: Left;  . Peripheral vascular catheterization N/A 12/31/2015    Procedure: A/V Shunt Intervention;  Surgeon: Algernon Huxley, MD;  Location: [facility];  Service: Cardiovascular;  Laterality: N/A;   Family Hx:  Family History  Problem Relation Age of Onset  . Cancer Mother   . Cancer  Father   . Diabetes Brother   . Heart disease Brother    Social Hx:   Social History  Substance Use Topics  . Smoking status: Current Every Day Smoker -- 0.50 packs/day    Types: Cigarettes  . Smokeless tobacco: Not on file  . Alcohol Use: No   Medication:   Current Outpatient Rx  Name  Route  Sig  Dispense  Refill  . albuterol (PROVENTIL HFA;VENTOLIN HFA) 108 (90 BASE) MCG/ACT inhaler   Inhalation   Inhale into the lungs every 4 (four) hours as needed for wheezing or shortness of breath. 2-4 puffs         . amLODipine (NORVASC) 10 MG tablet   Oral   Take 10 mg by mouth daily.         Marland Kitchen aspirin 81 MG tablet   Oral   Take 81 mg by mouth daily.         Marland Kitchen atorvastatin (LIPITOR) 80 MG tablet   Oral   Take 80 mg by mouth daily.         . calcium carbonate (OSCAL) 1500 (600 Ca) MG TABS tablet   Oral   Take 600 mg of elemental calcium by mouth daily with breakfast.         . cefUROXime (CEFTIN) 250 MG tablet   Oral   Take 1 tablet (250 mg total) by mouth 2 (two) times daily with a meal.   14 tablet   0   . Cholecalciferol (VITAMIN D3) 5000 UNITS CAPS   Oral   Take 1 tablet by mouth daily.         . DULoxetine (CYMBALTA) 60 MG capsule   Oral   Take 60 mg by mouth daily.         Marland Kitchen FOLIC ACID PO   Oral   Take 1 mg by mouth daily.          Marland Kitchen HYDROcodone-acetaminophen (NORCO) 5-325 MG tablet   Oral   Take 1-2 tablets by mouth every 6 (six) hours as needed for moderate pain or severe pain.   50 tablet   0   . levofloxacin (LEVAQUIN) 250 MG tablet   Oral   Take 1 tablet (250 mg total) by mouth every other day.   2 tablet   0   . lubiprostone (AMITIZA) 24 MCG capsule   Oral   Take 24 mcg by mouth daily as needed for constipation.         . meclizine (ANTIVERT) 25 MG tablet   Oral   Take 25 mg by mouth 3 (three) times daily as needed for dizziness.         . methocarbamol (ROBAXIN) 750 MG tablet   Oral   Take 750 mg by mouth daily as  needed for muscle spasms.         . metoprolol succinate (  TOPROL-XL) 100 MG 24 hr tablet   Oral   Take 100 mg by mouth daily. Take with or immediately following a meal.         . Multiple Vitamins-Minerals (ONE-A-DAY WOMENS 50 PLUS PO)   Oral   Take 1 tablet by mouth daily.         . nitroGLYCERIN (NITROLINGUAL) 0.4 MG/SPRAY spray   Sublingual   Place 1 spray under the tongue every 5 (five) minutes x 3 doses as needed for chest pain.         . Omega-3 Fatty Acids (FISH OIL) 1000 MG CPDR   Oral   Take by mouth once.         . Oxycodone HCl 10 MG TABS      Limit  1  tab po bid - qid if tolerated daily   120 tablet   0   . pantoprazole (PROTONIX) 40 MG tablet   Oral   Take 40 mg by mouth 2 (two) times daily.         . Pramoxine-Dimethicone 1-6 % CREA   Apply externally   Apply topically.         . sodium bicarbonate 650 MG tablet   Oral   Take 650 mg by mouth 2 (two) times daily.             Allergies:  Niaspan; Nystatin; and Sulfa antibiotics  Review of Systems: Gen:  Denies  fever, sweats, chills HEENT: Denies blurred vision, double vision. bleeds, sore throat Cvc:  No dizziness, chest pain. Resp:   Denies cough or sputum production, shortness of breath Gi: Denies swallowing difficulty, stomach pain. Gu:  Denies bladder incontinence, burning urine Ext:   No Joint pain, stiffness. Skin: No skin rash,  hives  Endoc:  No polyuria, polydipsia. Psych: No depression, insomnia. Other:  All other systems were reviewed with the patient and were negative other that what is mentioned in the HPI.   Physical Examination:   VS: BP 140/66 mmHg  Pulse 79  Ht 5' 6.5" (1.689 m)  Wt 229 lb (103.874 kg)  BMI 36.41 kg/m2  SpO2 97%  General Appearance: No distress  Neuro:without focal findings,  speech normal,  HEENT: PERRLA, EOM intact.   Pulmonary: normal breath sounds, Scattered left side crackles.  CardiovascularNormal S1,S2.  No m/r/g.   Abdomen:  Benign, Soft, non-tender. Renal:  No costovertebral tenderness  GU:  No performed at this time. Endoc: No evident thyromegaly, no signs of acromegaly. Skin:   warm, no rashes, no ecchymosis  Extremities: normal, no cyanosis, clubbing.  Other findings:    LABORATORY PANEL:   CBC No results for input(s): WBC, HGB, HCT, PLT in the last 168 hours. ------------------------------------------------------------------------------------------------------------------  Chemistries  No results for input(s): NA, K, CL, CO2, GLUCOSE, BUN, CREATININE, CALCIUM, MG, AST, ALT, ALKPHOS, BILITOT in the last 168 hours.  Invalid input(s): GFRCGP ------------------------------------------------------------------------------------------------------------------  Cardiac Enzymes No results for input(s): TROPONINI in the last 168 hours. ------------------------------------------------------------  RADIOLOGY:  No results found.     Thank  you for the consultation and for allowing [facility] to assist in the care of your patient. Our recommendations are noted above.  Please contact us if we can be of further service.   Marda Stalker, MD.  Board Certified in Internal Medicine, Pulmonary Medicine, [place], and Sleep Medicine.  [facility] Office Number: [phone]  Patricia Pesa, M.D.  Vilinda Boehringer, M.D.  Shanon Brow  Alva Garnet, M.D  01/19/2016

## 2016-01-19 NOTE — Patient Instructions (Addendum)
Will start advair 250 bid, rinse mouth after use.   Start nebulizer with ipratropium three times daily.   Your breathing will continue to get worse if you continue to smoke.   PFT, 2 view CXR before next visit.

## 2016-01-19 NOTE — Telephone Encounter (Signed)
Post procedure phone call.  States she is doing well.  

## 2016-01-19 NOTE — Addendum Note (Signed)
Addended by: Maryanna Shape A on: 01/19/2016 02:42 PM   Modules accepted: Orders

## 2016-01-26 ENCOUNTER — Encounter: Payer: Self-pay | Admitting: Pain Medicine

## 2016-01-26 ENCOUNTER — Ambulatory Visit: Payer: Medicare HMO | Attending: Pain Medicine | Admitting: Pain Medicine

## 2016-01-26 VITALS — BP 135/75 | HR 81 | Temp 97.7°F | Resp 16 | Ht 66.5 in | Wt 229.0 lb

## 2016-01-26 DIAGNOSIS — M461 Sacroiliitis, not elsewhere classified: Secondary | ICD-10-CM

## 2016-01-26 DIAGNOSIS — Z96653 Presence of artificial knee joint, bilateral: Secondary | ICD-10-CM | POA: Insufficient documentation

## 2016-01-26 DIAGNOSIS — M5116 Intervertebral disc disorders with radiculopathy, lumbar region: Secondary | ICD-10-CM | POA: Insufficient documentation

## 2016-01-26 DIAGNOSIS — M5127 Other intervertebral disc displacement, lumbosacral region: Secondary | ICD-10-CM | POA: Insufficient documentation

## 2016-01-26 DIAGNOSIS — M1610 Unilateral primary osteoarthritis, unspecified hip: Secondary | ICD-10-CM | POA: Diagnosis not present

## 2016-01-26 DIAGNOSIS — M19011 Primary osteoarthritis, right shoulder: Secondary | ICD-10-CM

## 2016-01-26 DIAGNOSIS — M5126 Other intervertebral disc displacement, lumbar region: Secondary | ICD-10-CM | POA: Insufficient documentation

## 2016-01-26 DIAGNOSIS — M19012 Primary osteoarthritis, left shoulder: Secondary | ICD-10-CM | POA: Insufficient documentation

## 2016-01-26 DIAGNOSIS — M533 Sacrococcygeal disorders, not elsewhere classified: Secondary | ICD-10-CM

## 2016-01-26 DIAGNOSIS — M5137 Other intervertebral disc degeneration, lumbosacral region: Secondary | ICD-10-CM | POA: Diagnosis not present

## 2016-01-26 DIAGNOSIS — M7060 Trochanteric bursitis, unspecified hip: Secondary | ICD-10-CM

## 2016-01-26 DIAGNOSIS — M47817 Spondylosis without myelopathy or radiculopathy, lumbosacral region: Secondary | ICD-10-CM

## 2016-01-26 DIAGNOSIS — M5416 Radiculopathy, lumbar region: Secondary | ICD-10-CM

## 2016-01-26 DIAGNOSIS — M172 Bilateral post-traumatic osteoarthritis of knee: Secondary | ICD-10-CM

## 2016-01-26 DIAGNOSIS — M160 Bilateral primary osteoarthritis of hip: Secondary | ICD-10-CM

## 2016-01-26 MED ORDER — OXYCODONE HCL 10 MG PO TABS: ORAL_TABLET | ORAL | Status: DC

## 2016-01-26 NOTE — Progress Notes (Signed)
Subjective:    Patient ID: Kimberly Shaffer, female    DOB: October 12, 1950, 65 y.o.   MRN: [id number]  HPI  The patient is a 65 year old female who returns to pain management for further evaluation and treatment of pain involving the shoulders entire back upper and lower extremity region. The patient is status post lumbosacral selective nerve root block performed for pain of the lumbar lower extremity region especially the right lower extremity performed at time of previous visit to pain management Center. The patient states that she is doing remarkably well following the procedure and that she is looking forward to have your procedure performed on the left side. The patient denies any trauma change in events of daily living the call significant change in symptomatology. The patient is tolerating oxycodone well without undesirable side effects. Discuss surgical evaluation patient prefers to avoid at this time as well patient is presently undergoing dialysis and will continue with dialysis as well as continue with the present treatment regimen at this time. The patient is to call pain management should they be change in condition prior to scheduled return appointment All are understanding and agreement suggested treatment plan    Review of Systems     Objective:   Physical Exam   There was tenderness to palpation of paraspinal misreading the cervical and cervical facet region a mild degree with mild tenderness of the splenius capitis and occipitalis muscle joint region. There was tenderness to palpation of the acromioclavicular and glenohumeral joint region a moderate degree with limited range of motion of the shoulder. The patient appeared to be with tends to palpation of the thoracic region thoracic facet region of moderate degree in the lower thoracic paraspinal musculature region with no crepitus of the thoracic region noted. Patient perform drop test with moderate difficulty. There appeared to be  unremarkable Spurling's maneuver. Tinel and Phalen's maneuver were without increase of pain of significant degree and patient appeared to be with bilaterally equal grip strength. There was tends to palpation over the paraspinal misreading the lumbar region lumbar facet region a moderate degree with lateral bending rotation extension and palpation of the lumbar facets reproducing moderate discomfort on the left compared to the right. EHL strength appeared to be slightly decreased. No definite sensory deficit or dermatomal dystrophy detected. There was tenderness to palpation of the PSIS and PII S region. There was mild to moderate tenderness of the greater trochanteric region iliotibial band region. Straight leg raise was tolerated to possibly 20 without a definite increased pain with dorsiflexion noted on the right. There was question increased pain with dorsiflexion noted on the left compared to the right there was negative clonus negative Homans. Patient is status post total   knee replacements. The abdomen was nontender with no costovertebral tenderness noted     Assessment & Plan:      Degenerative joint disease of shoulders  Degenerative joint disease of hip  Degenerative joint disease of knees  Status post total knee replacement  Degenerative disc disease lumbar spine   L1 to, L2-3, L3-4, L4-5, and L5-S1 with multilevel degenerative disc disease, disc bulging, broad-based disc bulging, left paracentral disc protrusion, superior margination of disc material, bilateral facet arthropathy, L1-2 and L2-3 and L3-4 with bilateral facet arthropathy as well as L4-5 and L5-S1.   Lumbar radiculopathy  Lumbar facet syndrome        PLAN   Continue present medication oxycodone  F/U PCP Dr. Julieanne Shaffer  for evaliation of  BP and  general medical  condition  F/U surgical evaluation as discussed. Patient will follow-up with ENT as well  F/U neurological evaluation. May consider  F/U with  vascular surgery as needed  Appointment with Dr. Holley Shaffer  as planned  F/U Drs. [place] and Parachos   May consider radiofrequency rhizolysis or intraspinal procedures pending response to present treatment and F/U evaluation . We will avoid such procedures at this time  Patient to call Pain Management Center should patient have concerns prior to scheduled return appointment.

## 2016-01-26 NOTE — Patient Instructions (Addendum)
Pain Management Discharge Instructions  General Discharge Instructions :  If you need to reach your doctor call: Monday-Friday 8:00 am - 4:00 pm at [phone] or toll free [phone].  After clinic hours [phone] to have operator reach doctor.  Bring all of your medication bottles to all your appointments in the pain clinic.  To cancel or reschedule your appointment with Pain Management please remember to call 24 hours in advance to avoid a fee.  Refer to the educational materials which you have been given on: General Risks, I had my Procedure. Discharge Instructions, Post Sedation.  Post Procedure Instructions:  The drugs you were given will stay in your system until tomorrow, so for the next 24 hours you should not drive, make any legal decisions or drink any alcoholic beverages.  You may eat anything you prefer, but it is better to start with liquids then soups and crackers, and gradually work up to solid foods.  PLAN   Continue present medication oxycodone  F/U PCP Dr. Julieanne Cotton  for evaliation of  BP and general medical  condition  F/U surgical evaluation as discussed. Patient will follow-up with ENT as well  F/U neurological evaluation. May consider  F/U with vascular surgery as needed  Appointment with Dr. Holley Raring  as planned  F/U Drs. [place] and [place]   May consider radiofrequency rhizolysis or intraspinal procedures pending response to present treatment and F/U evaluation . We will avoid such procedures at this time  Patient to call Pain Management Center should patient have concerns prior to scheduled return appointment.  Please notify your doctor immediately if you have any unusual bleeding, trouble breathing or pain that is not related to your normal pain.  Depending on the type of procedure that was done, some parts of your body may feel week and/or numb.  This usually clears up by tonight or the next day.  Walk with the use of an  assistive device or accompanied by an adult for the 24 hours.  You may use ice on the affected area for the first 24 hours.  Put ice in a Ziploc bag and cover with a towel and place against area 15 minutes on 15 minutes off.  You may switch to heat after 24 hours.

## 2016-01-26 NOTE — Progress Notes (Signed)
Safety precautions to be maintained throughout the outpatient stay will include: orient to surroundings, keep bed in low position, maintain call bell within reach at all times, provide assistance with transfer out of bed and ambulation.  

## 2016-01-28 ENCOUNTER — Telehealth: Payer: Self-pay | Admitting: Internal Medicine

## 2016-01-28 NOTE — Telephone Encounter (Signed)
Pt calling stating she received her Nebulizer but did not receive her Albuterol She is asking is that going to go to her local pharmacy or will that also be mailed to her If we are to use a local pharmacy patient is asking if we can use [facility] on [address]. Please advise

## 2016-01-29 NOTE — Telephone Encounter (Signed)
Per [place] at [place]. She contacted the pharmacy and per pharmacy they did not receive the Rx even though [facility] had confirmation that pharmacy did receive the Rx.  Kimberly Shaffer has re-faxed the Rx to the pharmacy, called pharmacy back and they did receive Rx this time.  Kimberly Shaffer will f/u with pharmacy around lunch time to obtain a ship date for medication.  Waiting on return call from [place] at [place]. [name]

## 2016-01-29 NOTE — Telephone Encounter (Signed)
Rhonda please call [facility] and see what they say about the meds. Thanks

## 2016-01-29 NOTE — Telephone Encounter (Signed)
Called and spoke with patient and advised her of what has transpired with the Rx for her nebulizer medication.  Advised her that I have contacted [facility] this morning and [facility] has re-faxed the Rx to the pharmacy.  I advised her that when I hear back from [place] as to when this medication should be delivered, that I would call her back and let her know. [name]

## 2016-01-29 NOTE — Telephone Encounter (Signed)
Arbie Cookey with [facility] returned call and stated that she spoke with the Pharmacy and order will be shipped today to arrive Saturday 01/30/16. Arbie Cookey with [facility] also contacted patient and advised her of delivery of medication and verified pt's address.  Nothing else needed at this time. [name]

## 2016-01-29 NOTE — Telephone Encounter (Signed)
Called and spoke with [place] at [place].  Advised that pt received nebulizer but not medication.  According to [facility] records, this medication was mailed out on 01/20/16. Lynn Ito will call and check on this information, contact patient and will call me back to advise. [name]

## 2016-02-04 DIAGNOSIS — Z01818 Encounter for other preprocedural examination: Secondary | ICD-10-CM | POA: Insufficient documentation

## 2016-02-04 LAB — TOXASSURE SELECT 13 (MW), URINE

## 2016-02-04 NOTE — Progress Notes (Signed)
Quick Note:  [place] report please ______

## 2016-02-05 ENCOUNTER — Telehealth: Payer: Self-pay | Admitting: *Deleted

## 2016-02-05 NOTE — Telephone Encounter (Signed)
Katie from [place] on my VM that they have tried multiple attempts to contact pt to get her the Ipratropium order by DR. States they will no longer contact pt and if she calls [place] have pt to call them.

## 2016-02-23 ENCOUNTER — Encounter: Payer: Self-pay | Admitting: Pain Medicine

## 2016-02-23 ENCOUNTER — Ambulatory Visit: Payer: Medicare HMO | Attending: Pain Medicine | Admitting: Pain Medicine

## 2016-02-23 ENCOUNTER — Other Ambulatory Visit: Payer: Self-pay | Admitting: Vascular Surgery

## 2016-02-23 VITALS — BP 121/61 | HR 71 | Temp 97.9°F | Resp 18 | Ht 66.0 in | Wt 227.0 lb

## 2016-02-23 DIAGNOSIS — M533 Sacrococcygeal disorders, not elsewhere classified: Secondary | ICD-10-CM

## 2016-02-23 DIAGNOSIS — M5416 Radiculopathy, lumbar region: Secondary | ICD-10-CM

## 2016-02-23 DIAGNOSIS — M7060 Trochanteric bursitis, unspecified hip: Secondary | ICD-10-CM

## 2016-02-23 DIAGNOSIS — M5116 Intervertebral disc disorders with radiculopathy, lumbar region: Secondary | ICD-10-CM | POA: Insufficient documentation

## 2016-02-23 DIAGNOSIS — M461 Sacroiliitis, not elsewhere classified: Secondary | ICD-10-CM

## 2016-02-23 DIAGNOSIS — M542 Cervicalgia: Secondary | ICD-10-CM | POA: Diagnosis present

## 2016-02-23 DIAGNOSIS — M160 Bilateral primary osteoarthritis of hip: Secondary | ICD-10-CM

## 2016-02-23 DIAGNOSIS — M79602 Pain in left arm: Secondary | ICD-10-CM | POA: Diagnosis present

## 2016-02-23 DIAGNOSIS — M5127 Other intervertebral disc displacement, lumbosacral region: Secondary | ICD-10-CM | POA: Diagnosis not present

## 2016-02-23 DIAGNOSIS — M19011 Primary osteoarthritis, right shoulder: Secondary | ICD-10-CM | POA: Insufficient documentation

## 2016-02-23 DIAGNOSIS — M170 Bilateral primary osteoarthritis of knee: Secondary | ICD-10-CM | POA: Insufficient documentation

## 2016-02-23 DIAGNOSIS — M47896 Other spondylosis, lumbar region: Secondary | ICD-10-CM | POA: Diagnosis not present

## 2016-02-23 DIAGNOSIS — M79601 Pain in right arm: Secondary | ICD-10-CM | POA: Diagnosis present

## 2016-02-23 DIAGNOSIS — M5137 Other intervertebral disc degeneration, lumbosacral region: Secondary | ICD-10-CM | POA: Insufficient documentation

## 2016-02-23 DIAGNOSIS — M1288 Other specific arthropathies, not elsewhere classified, other specified site: Secondary | ICD-10-CM | POA: Diagnosis not present

## 2016-02-23 DIAGNOSIS — Z96653 Presence of artificial knee joint, bilateral: Secondary | ICD-10-CM

## 2016-02-23 DIAGNOSIS — M1610 Unilateral primary osteoarthritis, unspecified hip: Secondary | ICD-10-CM | POA: Insufficient documentation

## 2016-02-23 DIAGNOSIS — M5126 Other intervertebral disc displacement, lumbar region: Secondary | ICD-10-CM | POA: Diagnosis not present

## 2016-02-23 DIAGNOSIS — M19012 Primary osteoarthritis, left shoulder: Secondary | ICD-10-CM | POA: Diagnosis not present

## 2016-02-23 DIAGNOSIS — M172 Bilateral post-traumatic osteoarthritis of knee: Secondary | ICD-10-CM

## 2016-02-23 DIAGNOSIS — M47817 Spondylosis without myelopathy or radiculopathy, lumbosacral region: Secondary | ICD-10-CM

## 2016-02-23 DIAGNOSIS — Z96659 Presence of unspecified artificial knee joint: Secondary | ICD-10-CM | POA: Diagnosis not present

## 2016-02-23 DIAGNOSIS — M150 Primary generalized (osteo)arthritis: Secondary | ICD-10-CM

## 2016-02-23 DIAGNOSIS — M159 Polyosteoarthritis, unspecified: Secondary | ICD-10-CM

## 2016-02-23 MED ORDER — OXYCODONE HCL 10 MG PO TABS: ORAL_TABLET | ORAL | Status: DC

## 2016-02-23 NOTE — Progress Notes (Signed)
Safety precautions to be maintained throughout the outpatient stay will include: orient to surroundings, keep bed in low position, maintain call bell within reach at all times, provide assistance with transfer out of bed and ambulation.  

## 2016-02-23 NOTE — Progress Notes (Signed)
Subjective:    Patient ID: Kimberly Shaffer, female    DOB: 1951/08/08, 65 y.o.   MRN: [id number]  HPI  The patient is a 65 year old female who returns to pain management for further evaluation and treatment of pain involving the region of the neck upper extremity region especially the region of the shoulder lower back and lower extremity regions. The patient has had significant improvement of pain involving the lower back and lower extremity region following treatment in pain management Center. The patient states that she has significant pain involving the region of the left shoulder. 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 We discussed patient's condition and informed patient that we will consider patient for procedure of the left shoulder pending medical clearance by her primary care physician or by vascular surgery or by [facility] The patient will continue oxycodone as prescribed at this time . We have discussed exercises which patient could consider an have also discussed additional studies of the shoulder. At the present time we will schedule patient for injection of shoulder and continue oxycodone. The patient's pain is aggravated by reaching and lifting pushing pulling and patient states that she is desperate to undergo procedure for severely disabling pain of the left shoulder. Left shoulder injection pending clearance by patient's treating physicians as mentioned    Review of Systems     Objective:   Physical Exam   There was tenderness of the splenius capitis and occipitalis regions palpation which reproduces pain of moderate degree with moderate tenderness of the cervical facet cervical paraspinal musculature region and moderate tenderness over the thoracic facet thoracic paraspinal musculature region. There was severe tenderness of the acromioclavicular and glenohumeral joint region with limited range of motion of the left shoulder. The patient had  difficulty ABducting the left shoulder to 90. There was decreased grip strength with unremarkable Spurling's maneuver. Tinel and Phalen's maneuver were without increased pain of significant degree. Fistula of the left upper extremity was noted. There was tenderness over the thoracic facet region with no crepitus of the thoracic region noted. Palpation over the lumbar paraspinal musculature region lumbar facet region was attends to palpation of moderate degree lateral bending rotation extension and palpation of the lumbar facets reproduce mild to moderate discomfort. Straight leg raise was tolerates approximately 20 there were well-healed surgical scars of the knees without increased warmth and erythema in the region of the knees with mild tenderness of the greater trochanteric region iliotibial band region and moderate tenderness of the PSIS and PII S regions. EHL strength appeared to be slightly decreased without definite sensory deficit of dermatomal distribution detected.  There was negative clonus andegative Homans abdomen was nontender and no costovertebral tenderness was noted       Assessment & Plan:      Degenerative joint disease of shoulders  Degenerative joint disease of hip  Degenerative joint disease of knees  Status post total knee replacement  Degenerative disc disease lumbar spine   L1 to, L2-3, L3-4, L4-5, and L5-S1 with multilevel degenerative disc disease, disc bulging, broad-based disc bulging, left paracentral disc protrusion, superior margination of disc material, bilateral facet arthropathy, L1-2 and L2-3 and L3-4 with bilateral facet arthropathy as well as L4-5 and L5-S1.   Lumbar radiculopathy      PLAN   Continue present medication oxycodone  Shoulder injection to be performed at time of return appointment  F/U PCP Dr. Julieanne Cotton  for evaliation of  BP and general medical  condition  F/U surgical evaluation as discussed. Patient will follow-up with ENT as  discussed and May consider additional surgical evaluations  F/U neurological evaluation. May consider  F/U with vascular surgery for revision of fistula as scheduled  Appointment with Dr. Holley Raring  as planned to discuss twitching and general medical condition as discussed  F/U Drs. [place] as discussed  May consider radiofrequency rhizolysis or intraspinal procedures pending response to present treatment and F/U evaluation . We will avoid such procedures at this time  Patient to call Pain Management Center should patient have concerns prior to scheduled return appointment

## 2016-02-23 NOTE — Patient Instructions (Addendum)
PLAN   Continue present medication oxycodone  Shoulder injection to be performed at time of return appointment  F/U PCP Dr. Julieanne Cotton  for evaliation of  BP and general medical  condition  F/U surgical evaluation as discussed. Patient will follow-up with ENT as discussed and May consider additional surgical evaluations  F/U neurological evaluation. May consider  F/U with vascular surgery for revision of fistula as scheduled  Appointment with Dr. Holley Raring  as planned to discuss twitching and general medical condition as discussed  F/U Drs. [place] as discussed  May consider radiofrequency rhizolysis or intraspinal procedures pending response to present treatment and F/U evaluation . We will avoid such procedures at this time  Patient to call Pain Management Center should patient have concerns prior to scheduled return appointment.Trigger Point Injection Trigger points are areas where you have muscle pain. A trigger point injection is a shot given in the trigger point to relieve that pain. A trigger point might feel like a knot in your muscle. It hurts to press on a trigger point. Sometimes the pain spreads out (radiates) to other parts of the body. For example, pressing on a trigger point in your shoulder might cause pain in your arm or neck. You might have one trigger point. Or, you might have more than one. People often have trigger points in their upper back and lower back. They also occur often in the neck and shoulders. Pain from a trigger point lasts for a long time. It can make it hard to keep moving. You might not be able to do the exercise or physical therapy that could help you deal with the pain. A trigger point injection may help. It does not work for everyone. But, it may relieve your pain for a few days or a few months. A trigger point injection does not cure long-lasting (chronic) pain. LET YOUR CAREGIVER KNOW ABOUT:  Any allergies (especially to  latex, lidocaine, or steroids).  Blood-thinning medicines that you take. These drugs can lead to bleeding or bruising after an injection. They include:  Aspirin.  Ibuprofen.  Clopidogrel.  Warfarin.  Other medicines you take. This includes all vitamins, herbs, eyedrops, over-the-counter medicines, and creams.  Use of steroids.  Recent infections.  Past problems with numbing medicines.  Bleeding problems.  Surgeries you have had.  Other health problems. RISKS AND COMPLICATIONS A trigger point injection is a safe treatment. However, problems may develop, such as:  Minor side effects usually go away in 1 to 2 days. These may include:  Soreness.  Bruising.  Stiffness.  More serious problems are rare. But, they may include:  Bleeding under the skin (hematoma).  Skin infection.  Breaking off of the needle under your skin.  Lung puncture.  The trigger point injection may not work for you. BEFORE THE PROCEDURE You may need to stop taking any medicine that thins your blood. This is to prevent bleeding and bruising. Usually these medicines are stopped several days before the injection. No other preparation is needed. PROCEDURE  A trigger point injection can be given in your caregiver's office or in a clinic. Each injection takes 2 minutes or less.  Your caregiver will feel for trigger points. The caregiver may use a marker to circle the area for the injection.  The skin over the trigger point will be washed with a germ-killing (antiseptic) solution.  The caregiver pinches the spot for the injection.  Then, a very thin needle is used for the  shot. You may feel pain or a twitching feeling when the needle enters the trigger point.  A numbing solution may be injected into the trigger point. Sometimes a drug to keep down swelling, redness, and warmth (inflammation) is also injected.  Your caregiver moves the needle around the trigger zone until the tightness and  twitching goes away.  After the injection, your caregiver may put gentle pressure over the injection site.  Then it is covered with a bandage. AFTER THE PROCEDURE  You can go right home after the injection.  The bandage can be taken off after a few hours.  You may feel sore and stiff for 1 to 2 days.  Go back to your regular activities slowly. Your caregiver may ask you to stretch your muscles. Do not do anything that takes extra energy for a few days.  Follow your caregiver's instructions to manage and treat other pain.   This information is not intended to replace advice given to you by your health care provider. Make sure you discuss any questions you have with your health care provider.   Document Released: 08/25/2011 Document Revised: 12/31/2012 Document Reviewed: 08/25/2011 Elsevier Interactive Patient Education 2016 [place]  What are the risk, side effects and possible complications? Generally speaking, most procedures are safe.  However, with any procedure there are risks, side effects, and the possibility of complications.  The risks and complications are dependent upon the sites that are lesioned, or the type of nerve block to be performed.  The closer the procedure is to the spine, the more serious the risks are.  Great care is taken when placing the radio frequency needles, block needles or lesioning probes, but sometimes complications can occur.  Infection: Any time there is an injection through the skin, there is a risk of infection.  This is why sterile conditions are used for these blocks.  There are four possible types of infection.  Localized skin infection.  Central Nervous System Infection-This can be in the form of Meningitis, which can be deadly.  Epidural Infections-This can be in the form of an epidural abscess, which can cause pressure inside of the spine, causing compression of the spinal cord with subsequent paralysis.  This would require an emergency surgery to decompress, and there are no guarantees that the patient would recover from the paralysis.  Discitis-This is an infection of the intervertebral discs.  It occurs in about 1% of discography procedures.  It is difficult to treat and it may lead to surgery.        2. Pain: the needles have to go through skin and soft tissues, will cause soreness.       3. Damage to internal structures:  The nerves to be lesioned may be near blood vessels or    other nerves which can be potentially damaged.       4. Bleeding: Bleeding is more common if the patient is taking blood thinners such as  aspirin, Coumadin, Ticiid, Plavix, etc., or if he/she have some genetic predisposition  such as hemophilia. Bleeding into the spinal canal can cause compression of the spinal  cord with subsequent paralysis.  This would require an emergency surgery to  decompress and there are no guarantees that the patient would recover from the  paralysis.       5. Pneumothorax:  Puncturing of a lung is a possibility, every time a needle is introduced in  the area of the chest or upper back.  Pneumothorax refers to free air around the  collapsed lung(s), inside of the thoracic cavity (chest cavity).  Another two possible  complications related to a similar event would include: Hemothorax and Chylothorax.   These are variations of the Pneumothorax, where instead of air around the collapsed  lung(s), you may have blood or chyle, respectively.       6. Spinal headaches: They may occur with any procedures in the area of the spine.       7. Persistent CSF (Cerebro-Spinal Fluid) leakage: This is a rare problem, but may occur  with prolonged intrathecal or epidural catheters either due to the formation of a fistulous  track or a dural tear.       8. Nerve damage: By working so close to the spinal cord, there is always a possibility of  nerve damage, which could be as serious as a permanent spinal cord injury  with  paralysis.       9. Death:  Although rare, severe deadly allergic reactions known as "Anaphylactic  reaction" can occur to any of the medications used.      10. Worsening of the symptoms:  We can always make thing worse.  What are the chances of something like this happening? Chances of any of this occuring are extremely low.  By statistics, you have more of a chance of getting killed in a motor vehicle accident: while driving to the hospital than any of the above occurring .  Nevertheless, you should be aware that they are possibilities.  In general, it is similar to taking a shower.  Everybody knows that you can slip, hit your head and get killed.  Does that mean that you should not shower again?  Nevertheless always keep in mind that statistics do not mean anything if you happen to be on the wrong side of them.  Even if a procedure has a 1 (one) in a 1,000,000 (million) chance of going wrong, it you happen to be that one..Also, keep in mind that by statistics, you have more of a chance of having something go wrong when taking medications.  Who should not have this procedure? If you are on a blood thinning medication (e.g. Coumadin, Plavix, see list of "Blood Thinners"), or if you have an active infection going on, you should not have the procedure.  If you are taking any blood thinners, please inform your physician.  How should I prepare for this procedure?  Do not eat or drink anything at least six hours prior to the procedure.  Bring a driver with you .  It cannot be a taxi.  Come accompanied by an adult that can drive you back, and that is strong enough to help you if your legs get weak or numb from the local anesthetic.  Take all of your medicines the morning of the procedure with just enough water to swallow them.  If you have diabetes, make sure that you are scheduled to have your procedure done first thing in the morning, whenever possible.  If you have diabetes, take only half  of your insulin dose and notify our nurse that you have done so as soon as you arrive at the clinic.  If you are diabetic, but only take blood sugar pills (oral hypoglycemic), then do not take them on the morning of your procedure.  You may take them after you have had the procedure.  Do not take aspirin or any aspirin-containing medications, at least eleven (11) days prior  to the procedure.  They may prolong bleeding.  Wear loose fitting clothing that may be easy to take off and that you would not mind if it got stained with Betadine or blood.  Do not wear any jewelry or perfume  Remove any nail coloring.  It will interfere with some of our monitoring equipment.  NOTE: Remember that this is not meant to be interpreted as a complete list of all possible complications.  Unforeseen problems may occur.  BLOOD THINNERS The following drugs contain aspirin or other products, which can cause increased bleeding during surgery and should not be taken for 2 weeks prior to and 1 week after surgery.  If you should need take something for relief of minor pain, you may take acetaminophen which is found in Tylenol,m Datril, Anacin-3 and Panadol. It is not blood thinner. The products listed below are.  Do not take any of the products listed below in addition to any listed on your instruction sheet.  A.P.C or A.P.C with Codeine Codeine Phosphate Capsules #3 Ibuprofen Ridaura  ABC compound Congesprin Imuran rimadil  Advil Cope Indocin Robaxisal  Alka-Seltzer Effervescent Pain Reliever and Antacid Coricidin or Coricidin-D  Indomethacin Rufen  Alka-Seltzer plus Cold Medicine Cosprin Ketoprofen S-A-C Tablets  Anacin Analgesic Tablets or Capsules Coumadin Korlgesic Salflex  Anacin Extra Strength Analgesic tablets or capsules CP-2 Tablets Lanoril Salicylate  Anaprox Cuprimine Capsules Levenox Salocol  Anexsia-D Dalteparin Magan Salsalate  Anodynos Darvon compound Magnesium Salicylate Sine-off  Ansaid Dasin  Capsules Magsal Sodium Salicylate  Anturane Depen Capsules Marnal Soma  APF Arthritis pain formula Dewitt's Pills Measurin Stanback  Argesic Dia-Gesic Meclofenamic Sulfinpyrazone  Arthritis Bayer Timed Release Aspirin Diclofenac Meclomen Sulindac  Arthritis pain formula Anacin Dicumarol Medipren Supac  Analgesic (Safety coated) Arthralgen Diffunasal Mefanamic Suprofen  Arthritis Strength Bufferin Dihydrocodeine Mepro Compound Suprol  Arthropan liquid Dopirydamole Methcarbomol with Aspirin Synalgos  ASA tablets/Enseals Disalcid Micrainin Tagament  Ascriptin Doan's Midol Talwin  Ascriptin A/D Dolene Mobidin Tanderil  Ascriptin Extra Strength Dolobid Moblgesic Ticlid  Ascriptin with Codeine Doloprin or Doloprin with Codeine Momentum Tolectin  Asperbuf Duoprin Mono-gesic Trendar  Aspergum Duradyne Motrin or Motrin IB Triminicin  Aspirin plain, buffered or enteric coated Durasal Myochrisine Trigesic  Aspirin Suppositories Easprin Nalfon Trillsate  Aspirin with Codeine Ecotrin Regular or Extra Strength Naprosyn Uracel  Atromid-S Efficin Naproxen Ursinus  Auranofin Capsules Elmiron Neocylate Vanquish  Axotal Emagrin Norgesic Verin  Azathioprine Empirin or Empirin with Codeine Normiflo Vitamin E  Azolid Emprazil Nuprin Voltaren  Bayer Aspirin plain, buffered or children's or timed BC Tablets or powders Encaprin Orgaran Warfarin Sodium  Buff-a-Comp Enoxaparin Orudis Zorpin  Buff-a-Comp with Codeine Equegesic Os-Cal-Gesic   Buffaprin Excedrin plain, buffered or Extra Strength Oxalid   Bufferin Arthritis Strength Feldene Oxphenbutazone   Bufferin plain or Extra Strength Feldene Capsules Oxycodone with Aspirin   Bufferin with Codeine Fenoprofen Fenoprofen Pabalate or Pabalate-SF   Buffets II Flogesic Panagesic   Buffinol plain or Extra Strength Florinal or Florinal with Codeine Panwarfarin   Buf-Tabs Flurbiprofen Penicillamine   Butalbital Compound Four-way cold tablets Penicillin    Butazolidin Fragmin Pepto-Bismol   Carbenicillin Geminisyn Percodan   Carna Arthritis Reliever Geopen Persantine   Carprofen Gold's salt Persistin   Chloramphenicol Goody's Phenylbutazone   Chloromycetin Haltrain Piroxlcam   Clmetidine heparin Plaquenil   Cllnoril Hyco-pap Ponstel   Clofibrate Hydroxy chloroquine Propoxyphen         Before stopping any of these medications, be sure to consult the physician who ordered them.  Some, such as Coumadin (Warfarin) are ordered to prevent or treat serious conditions such as "deep thrombosis", "pumonary embolisms", and other heart problems.  The amount of time that you may need off of the medication may also vary with the medication and the reason for which you were taking it.  If you are taking any of these medications, please make sure you notify your pain physician before you undergo any procedures.

## 2016-02-25 ENCOUNTER — Encounter: Payer: Self-pay | Admitting: Vascular Surgery

## 2016-02-25 ENCOUNTER — Ambulatory Visit
Admission: RE | Admit: 2016-02-25 | Discharge: 2016-02-25 | Disposition: A | Discharge status: Home / Self Care | Payer: Medicare HMO | Source: Ambulatory Visit | Attending: Vascular Surgery | Admitting: Vascular Surgery

## 2016-02-25 ENCOUNTER — Encounter
Admission: RE | Disposition: A | Discharge status: Home / Self Care | Payer: Self-pay | Source: Ambulatory Visit | Attending: Vascular Surgery

## 2016-02-25 DIAGNOSIS — Z888 Allergy status to other drugs, medicaments and biological substances status: Secondary | ICD-10-CM | POA: Insufficient documentation

## 2016-02-25 DIAGNOSIS — Z833 Family history of diabetes mellitus: Secondary | ICD-10-CM | POA: Diagnosis not present

## 2016-02-25 DIAGNOSIS — M2550 Pain in unspecified joint: Secondary | ICD-10-CM | POA: Diagnosis not present

## 2016-02-25 DIAGNOSIS — I6529 Occlusion and stenosis of unspecified carotid artery: Secondary | ICD-10-CM | POA: Diagnosis not present

## 2016-02-25 DIAGNOSIS — Z809 Family history of malignant neoplasm, unspecified: Secondary | ICD-10-CM | POA: Diagnosis not present

## 2016-02-25 DIAGNOSIS — Z7984 Long term (current) use of oral hypoglycemic drugs: Secondary | ICD-10-CM | POA: Diagnosis not present

## 2016-02-25 DIAGNOSIS — R0989 Other specified symptoms and signs involving the circulatory and respiratory systems: Secondary | ICD-10-CM | POA: Diagnosis not present

## 2016-02-25 DIAGNOSIS — I120 Hypertensive chronic kidney disease with stage 5 chronic kidney disease or end stage renal disease: Secondary | ICD-10-CM | POA: Insufficient documentation

## 2016-02-25 DIAGNOSIS — N186 End stage renal disease: Secondary | ICD-10-CM | POA: Insufficient documentation

## 2016-02-25 DIAGNOSIS — E785 Hyperlipidemia, unspecified: Secondary | ICD-10-CM | POA: Diagnosis not present

## 2016-02-25 DIAGNOSIS — I639 Cerebral infarction, unspecified: Secondary | ICD-10-CM | POA: Diagnosis not present

## 2016-02-25 DIAGNOSIS — E1122 Type 2 diabetes mellitus with diabetic chronic kidney disease: Secondary | ICD-10-CM | POA: Insufficient documentation

## 2016-02-25 DIAGNOSIS — M6281 Muscle weakness (generalized): Secondary | ICD-10-CM | POA: Insufficient documentation

## 2016-02-25 DIAGNOSIS — F1021 Alcohol dependence, in remission: Secondary | ICD-10-CM | POA: Insufficient documentation

## 2016-02-25 DIAGNOSIS — Z955 Presence of coronary angioplasty implant and graft: Secondary | ICD-10-CM | POA: Diagnosis not present

## 2016-02-25 DIAGNOSIS — I999 Unspecified disorder of circulatory system: Secondary | ICD-10-CM | POA: Diagnosis not present

## 2016-02-25 DIAGNOSIS — M4806 Spinal stenosis, lumbar region: Secondary | ICD-10-CM | POA: Insufficient documentation

## 2016-02-25 DIAGNOSIS — E669 Obesity, unspecified: Secondary | ICD-10-CM | POA: Insufficient documentation

## 2016-02-25 DIAGNOSIS — F17200 Nicotine dependence, unspecified, uncomplicated: Secondary | ICD-10-CM | POA: Diagnosis not present

## 2016-02-25 DIAGNOSIS — Z992 Dependence on renal dialysis: Secondary | ICD-10-CM | POA: Insufficient documentation

## 2016-02-25 DIAGNOSIS — Y832 Surgical operation with anastomosis, bypass or graft as the cause of abnormal reaction of the patient, or of later complication, without mention of misadventure at the time of the procedure: Secondary | ICD-10-CM | POA: Insufficient documentation

## 2016-02-25 DIAGNOSIS — Z882 Allergy status to sulfonamides status: Secondary | ICD-10-CM | POA: Diagnosis not present

## 2016-02-25 DIAGNOSIS — Z8249 Family history of ischemic heart disease and other diseases of the circulatory system: Secondary | ICD-10-CM | POA: Insufficient documentation

## 2016-02-25 DIAGNOSIS — I8390 Asymptomatic varicose veins of unspecified lower extremity: Secondary | ICD-10-CM | POA: Diagnosis not present

## 2016-02-25 DIAGNOSIS — Z6833 Body mass index (BMI) 33.0-33.9, adult: Secondary | ICD-10-CM | POA: Diagnosis not present

## 2016-02-25 DIAGNOSIS — T82858A Stenosis of vascular prosthetic devices, implants and grafts, initial encounter: Secondary | ICD-10-CM | POA: Insufficient documentation

## 2016-02-25 HISTORY — PX: PERIPHERAL VASCULAR CATHETERIZATION: SHX172C

## 2016-02-25 LAB — POTASSIUM (ARMC VASCULAR LAB ONLY): POTASSIUM (ARMC VASCULAR LAB): 3.6 (ref 3.5–5.1)

## 2016-02-25 SURGERY — A/V SHUNTOGRAM/FISTULAGRAM
Anesthesia: Moderate Sedation | Site: Arm Upper | Laterality: Left

## 2016-02-25 MED ORDER — METHYLPREDNISOLONE SODIUM SUCC 125 MG IJ SOLR
125.0000 mg | INTRAMUSCULAR | Status: DC | PRN
Start: 2016-02-25 — End: 2016-02-25

## 2016-02-25 MED ORDER — LIDOCAINE-EPINEPHRINE (PF) 1 %-1:200000 IJ SOLN
INTRAMUSCULAR | Status: AC
  Filled 2016-02-25: qty 30

## 2016-02-25 MED ORDER — HYDROMORPHONE HCL 1 MG/ML IJ SOLN: 1.0000 mg | Freq: Once | INTRAMUSCULAR | Status: DC

## 2016-02-25 MED ORDER — SODIUM CHLORIDE 0.9 % IV SOLN
INTRAVENOUS | Status: DC
  Administered 2016-02-25: 09:00:00 via INTRAVENOUS

## 2016-02-25 MED ORDER — FENTANYL CITRATE (PF) 100 MCG/2ML IJ SOLN
INTRAMUSCULAR | Status: DC | PRN
  Administered 2016-02-25: 50 ug via INTRAVENOUS

## 2016-02-25 MED ORDER — IOPAMIDOL (ISOVUE-300) INJECTION 61%
INTRAVENOUS | Status: DC | PRN
Start: 2016-02-25 — End: 2016-02-25
  Administered 2016-02-25: 35 mL via INTRA_ARTERIAL

## 2016-02-25 MED ORDER — HEPARIN SODIUM (PORCINE) 1000 UNIT/ML IJ SOLN
INTRAMUSCULAR | Status: DC | PRN
  Administered 2016-02-25: 3000 [IU] via INTRAVENOUS

## 2016-02-25 MED ORDER — FENTANYL CITRATE (PF) 100 MCG/2ML IJ SOLN
INTRAMUSCULAR | Status: AC
  Filled 2016-02-25: qty 2

## 2016-02-25 MED ORDER — HEPARIN (PORCINE) IN NACL 2-0.9 UNIT/ML-% IJ SOLN
INTRAMUSCULAR | Status: AC
  Filled 2016-02-25: qty 1000

## 2016-02-25 MED ORDER — MIDAZOLAM HCL 5 MG/5ML IJ SOLN
INTRAMUSCULAR | Status: AC
  Filled 2016-02-25: qty 5

## 2016-02-25 MED ORDER — DEXTROSE 5 % IV SOLN
1.5000 g | INTRAVENOUS | Status: AC
  Administered 2016-02-25: 1.5 g via INTRAVENOUS

## 2016-02-25 MED ORDER — MIDAZOLAM HCL 2 MG/2ML IJ SOLN
INTRAMUSCULAR | Status: DC | PRN
  Administered 2016-02-25: 2 mg via INTRAVENOUS

## 2016-02-25 MED ORDER — ONDANSETRON HCL 4 MG/2ML IJ SOLN: 4.0000 mg | Freq: Four times a day (QID) | INTRAMUSCULAR | Status: DC | PRN

## 2016-02-25 MED ORDER — FAMOTIDINE 20 MG PO TABS: 40.0000 mg | ORAL_TABLET | ORAL | Status: DC | PRN

## 2016-02-25 MED ORDER — HEPARIN SODIUM (PORCINE) 1000 UNIT/ML IJ SOLN
INTRAMUSCULAR | Status: AC
  Filled 2016-02-25: qty 1

## 2016-02-25 MED ORDER — LIDOCAINE-EPINEPHRINE (PF) 1 %-1:200000 IJ SOLN
INTRAMUSCULAR | Status: DC | PRN
  Administered 2016-02-25: 10 mL via INTRADERMAL

## 2016-02-25 SURGICAL SUPPLY — 12 items
BALLN DORADO 7X60X80 (BALLOONS) ×3
BALLN DORADO 8X40X80 (BALLOONS) ×3
BALLOON DORADO 7X60X80 (BALLOONS) ×1 IMPLANT
BALLOON DORADO 8X40X80 (BALLOONS) ×1 IMPLANT
CANNULA 5F STIFF (CANNULA) ×3 IMPLANT
DEVICE PRESTO INFLATION (MISCELLANEOUS) ×3 IMPLANT
DRAPE BRACHIAL (DRAPES) ×3 IMPLANT
NEEDLE ENTRY 21GA 7CM ECHOTIP (NEEDLE) ×3 IMPLANT
PACK ANGIOGRAPHY (CUSTOM PROCEDURE TRAY) ×3 IMPLANT
SHEATH BRITE TIP 6FRX5.5 (SHEATH) ×3 IMPLANT
TOWEL OR 17X26 4PK STRL BLUE (TOWEL DISPOSABLE) ×3 IMPLANT
WIRE MAGIC TOR.035 180C (WIRE) ×3 IMPLANT

## 2016-02-25 NOTE — Op Note (Signed)
[facility]    OPERATIVE NOTE   PROCEDURE: 1.   Left brachiocephalic arteriovenous fistula cannulation under ultrasound guidance 2.   Left arm fistulagram including central venogram 3.   Percutaneous transluminal angioplasty of left upper arm cephalic vein with 7 and 8 mm diameter high pressure angioplasty balloon  PRE-OPERATIVE DIAGNOSIS: 1. ESRD 2. Poorly functional left brachiocephalic AVF  POST-OPERATIVE DIAGNOSIS: same as above   SURGEON: Leotis Pain, MD  ANESTHESIA: local with MCS  ESTIMATED BLOOD LOSS: Minimal  FINDING(S): 1. 70-75% stenosis of the cephalic vein in the mid upper arm with an otherwise patent AV fistula  SPECIMEN(S):  None  CONTRAST: 35 cc  FLUORO TIME: 1.5 minutes  MODERATE CONSCIOUS SEDATION TIME: Approximately 20 minutes with 2 mg of Versed and 50 mcg of Fentanyl   INDICATIONS: Kimberly Shaffer is a 65 y.o. female who presents with malfunctioning  left brachiocephalic arteriovenous fistula.  The patient is scheduled for  left arm fistulagram.  The patient is aware the risks include but are not limited to: bleeding, infection, thrombosis of the cannulated access, and possible anaphylactic reaction to the contrast.  The patient is aware of the risks of the procedure and elects to proceed forward.  DESCRIPTION: After full informed written consent was obtained, the patient was brought back to the angiography suite and placed supine upon the angiography table.  The patient was connected to monitoring equipment. Moderate conscious sedation was administered with a face to face encounter with the patient throughout the procedure with my supervision of the RN administering medicines and monitoring the patient's vital signs and mental status throughout from the start of the procedure until the patient was taken to the recovery room. The  left arm was prepped and draped in the standard fashion for a percutaneous access intervention.  Under  ultrasound guidance, the  left arm arteriovenous fistula was cannulated with a micropuncture needle under direct ultrasound guidance and a permanent image was performed.  The microwire was advanced into the fistula and the needle was exchanged for the a microsheath.  I then upsized to a 6 Fr Sheath and imaging was performed.  Hand injections were completed to image the access including the central venous system. This demonstrated about a 70-75% stenosis in the mid upper arm cephalic vein with a couple of small to medium size branches present. The central venous circulation was present. No other obvious stenoses were identified.  Based on the images, this patient will need intervention to this area. I then gave the patient 3000 units of intravenous heparin.  I then crossed the stenosis with a Magic Tourqe wire.  Based on the imaging, a 7 mm x 6 cm  high pressure angioplasty balloon was selected.  The balloon was centered around the mid upper arm cephalic vein stenosis and inflated to 16 ATM for 1 minute(s).  This was slightly undersized. I then upsized to an 8 mm diameter by 4 cm length high pressure angioplasty balloon. This was centered around the mid upper arm cephalic vein stenosis and inflated to 14 atm for 1 minute. On completion imaging, a 25-30 % residual stenosis was present.    Based on the completion imaging, no further intervention is necessary.  The wire and balloon were removed from the sheath.  A 4-0 Monocryl purse-string suture was sewn around the sheath.  The sheath was removed while tying down the suture.  A sterile bandage was applied to the puncture site.  COMPLICATIONS: None  CONDITION:  Stable   Jentry Mcqueary  02/25/2016 10:37 AM

## 2016-02-25 NOTE — Discharge Instructions (Signed)
Fistulogram, Care After °Refer to this sheet in the next few weeks. These instructions provide you with information on caring for yourself after your procedure. Your health care provider may also give you more specific instructions. Your treatment has been planned according to current medical practices, but problems sometimes occur. Call your health care provider if you have any problems or questions after your procedure. °WHAT TO EXPECT AFTER THE PROCEDURE °After your procedure, it is typical to have the following: °· A small amount of discomfort in the area where the catheters were placed. °· A small amount of bruising around the fistula. °· Sleepiness and fatigue. °HOME CARE INSTRUCTIONS °· Rest at home for the day following your procedure. °· Do not drive or operate heavy machinery while taking pain medicine. °· Take medicines only as directed by your health care provider. °· Do not take baths, swim, or use a hot tub until your health care provider approves. You may shower 24 hours after the procedure or as directed by your health care provider. °· There are many different ways to close and cover an incision, including stitches, skin glue, and adhesive strips. Follow your health care provider's instructions on: °¨ Incision care. °¨ Bandage (dressing) changes and removal. °¨ Incision closure removal. °· Monitor your dialysis fistula carefully. °SEEK MEDICAL CARE IF: °· You have drainage, redness, swelling, or pain at your catheter site. °· You have a fever. °· You have chills. °SEEK IMMEDIATE MEDICAL CARE IF: °· You feel weak. °· You have trouble balancing. °· You have trouble moving your arms or legs. °· You have problems with your speech or vision. °· You can no longer feel a vibration or buzz when you put your fingers over your dialysis fistula. °· The limb that was used for the procedure: °¨ Swells. °¨ Is painful. °¨ Is cold. °¨ Is discolored, such as blue or pale white. °  °This information is not intended  to replace advice given to you by your health care provider. Make sure you discuss any questions you have with your health care provider. °  °Document Released: 01/20/2014 Document Reviewed: 01/20/2014 °Elsevier Interactive Patient Education ©2016 [employer] ° °

## 2016-02-25 NOTE — H&P (Signed)
  [facility] History & Physical Update  The patient was interviewed and re-examined.  The patient's previous History and Physical has been reviewed and is unchanged.  There is no change in the plan of care. We plan to proceed with the scheduled procedure.  Thao Vanover, MD  02/25/2016, 9:40 AM

## 2016-03-07 ENCOUNTER — Encounter: Payer: Self-pay | Admitting: Pain Medicine

## 2016-03-07 ENCOUNTER — Ambulatory Visit: Payer: Medicare HMO | Attending: Pain Medicine | Admitting: Pain Medicine

## 2016-03-07 VITALS — BP 136/76 | HR 62 | Temp 98.1°F | Resp 18 | Ht 66.0 in | Wt 228.0 lb

## 2016-03-07 DIAGNOSIS — M159 Polyosteoarthritis, unspecified: Secondary | ICD-10-CM

## 2016-03-07 DIAGNOSIS — M19012 Primary osteoarthritis, left shoulder: Secondary | ICD-10-CM | POA: Diagnosis not present

## 2016-03-07 DIAGNOSIS — M150 Primary generalized (osteo)arthritis: Secondary | ICD-10-CM

## 2016-03-07 DIAGNOSIS — M19011 Primary osteoarthritis, right shoulder: Secondary | ICD-10-CM

## 2016-03-07 DIAGNOSIS — M170 Bilateral primary osteoarthritis of knee: Secondary | ICD-10-CM

## 2016-03-07 DIAGNOSIS — M160 Bilateral primary osteoarthritis of hip: Secondary | ICD-10-CM

## 2016-03-07 DIAGNOSIS — Z96653 Presence of artificial knee joint, bilateral: Secondary | ICD-10-CM

## 2016-03-07 DIAGNOSIS — M533 Sacrococcygeal disorders, not elsewhere classified: Secondary | ICD-10-CM

## 2016-03-07 DIAGNOSIS — M25512 Pain in left shoulder: Secondary | ICD-10-CM | POA: Diagnosis present

## 2016-03-07 DIAGNOSIS — M172 Bilateral post-traumatic osteoarthritis of knee: Secondary | ICD-10-CM

## 2016-03-07 DIAGNOSIS — Z9889 Other specified postprocedural states: Secondary | ICD-10-CM | POA: Diagnosis not present

## 2016-03-07 DIAGNOSIS — M7060 Trochanteric bursitis, unspecified hip: Secondary | ICD-10-CM

## 2016-03-07 DIAGNOSIS — M5416 Radiculopathy, lumbar region: Secondary | ICD-10-CM

## 2016-03-07 DIAGNOSIS — M5137 Other intervertebral disc degeneration, lumbosacral region: Secondary | ICD-10-CM

## 2016-03-07 DIAGNOSIS — M47817 Spondylosis without myelopathy or radiculopathy, lumbosacral region: Secondary | ICD-10-CM

## 2016-03-07 DIAGNOSIS — M461 Sacroiliitis, not elsewhere classified: Secondary | ICD-10-CM

## 2016-03-07 MED ORDER — SODIUM CHLORIDE 0.9 % IJ SOLN
INTRAMUSCULAR | Status: AC
  Filled 2016-03-07: qty 10

## 2016-03-07 MED ORDER — SODIUM CHLORIDE 0.9% FLUSH: 20.0000 mL | Freq: Once | INTRAVENOUS | Status: DC

## 2016-03-07 MED ORDER — TRIAMCINOLONE ACETONIDE 40 MG/ML IJ SUSP
40.0000 mg | Freq: Once | INTRAMUSCULAR | Status: AC
  Administered 2016-03-07: 40 mg
  Filled 2016-03-07: qty 1

## 2016-03-07 MED ORDER — BUPIVACAINE HCL (PF) 0.25 % IJ SOLN
30.0000 mL | Freq: Once | INTRAMUSCULAR | Status: AC
  Administered 2016-03-07: 30 mL
  Filled 2016-03-07: qty 30

## 2016-03-07 MED ORDER — ORPHENADRINE CITRATE 30 MG/ML IJ SOLN: 60.0000 mg | Freq: Once | INTRAMUSCULAR | Status: DC

## 2016-03-07 NOTE — Patient Instructions (Addendum)
PLAN   Continue present medication oxycodone  F/U PCP Dr. Julieanne Cotton  for evaliation of  BP and general medical  condition  F/U surgical evaluation as discussed. Patient will follow-up with ENT as discussed and May consider additional surgical evaluations including evaluation of shoulder pending follow-up evaluations and response to treatment  F/U neurological evaluation. May consider PN[facility] EMG studies and other studies  F/U with vascular surgery for revision of fistula as scheduled  Appointment with Dr. Holley Raring  as planned   F/U Drs. [place] as discussed  May consider radiofrequency rhizolysis or intraspinal procedures pending response to present treatment and F/U evaluation . We will avoid such procedures at this time  Patient to call Pain Management Center should patient have concerns prior to scheduled return appointment.Pain Management Discharge Instructions  General Discharge Instructions :  If you need to reach your doctor call: Monday-Friday 8:00 am - 4:00 pm at [phone] or toll free [phone].  After clinic hours [phone] to have operator reach doctor.  Bring all of your medication bottles to all your appointments in the pain clinic.  To cancel or reschedule your appointment with Pain Management please remember to call 24 hours in advance to avoid a fee.  Refer to the educational materials which you have been given on: General Risks, I had my Procedure. Discharge Instructions, Post Sedation.  Post Procedure Instructions:  The drugs you were given will stay in your system until tomorrow, so for the next 24 hours you should not drive, make any legal decisions or drink any alcoholic beverages.  You may eat anything you prefer, but it is better to start with liquids then soups and crackers, and gradually work up to solid foods.  Please notify your doctor immediately if you have any unusual bleeding, trouble breathing or pain that is  not related to your normal pain.  Depending on the type of procedure that was done, some parts of your body may feel week and/or numb.  This usually clears up by tonight or the next day.  Walk with the use of an assistive device or accompanied by an adult for the 24 hours.  You may use ice on the affected area for the first 24 hours.  Put ice in a Ziploc bag and cover with a towel and place against area 15 minutes on 15 minutes off.  You may switch to heat after 24 hours.

## 2016-03-07 NOTE — Progress Notes (Signed)
   Subjective:    Patient ID: Kimberly Shaffer, female    DOB: Jun 14, 1951, 65 y.o.   MRN: [id number]  HPI                                   LEFT SHOULDER INJECTION    The patient is a 65 -year-old female who returns to pain management for further evaluation and treatment of pain involving the region of the left shoulder. The patient is with prior studies revealing patient to be with degenerative changes of the shoulder as well as prior surgical intervention of the shoulder. The risks benefits and expectations of procedure have been discussed and explained to patient who is with understanding and wishes to proceed with procedure in attempt to decrease severity of symptoms, minimize progression of symptoms, and avoid the need for more involved treatment are in agreement to proceed with shoulder injection as planned    Description Of Procedure:  Left Shoulder Injection (Anterior Approach)  With the patient in sitting position with EKG, blood pressure, pulse, and pulse oximetry monitoring all in place, Betadine prep of proposed entry site was accomplished following identification of landmarks for left shoulder injection anterior approach. Following identification of landmarks for shoulder injection anterior approach, a 22-gauge needle was inserted in the anterior region of the left shoulder and 2 cc of 0.25% bupivacaine with Kenalog was injected for left shoulder injection anterior approach. The patient tolerated the injection well  Description Of Procedure  Left Shoulder Injection (Posterior Approach)  With the patient in the sitting position with EKG, blood pressure, pulse, and pulse oximetry monitoring all in place, Betadine prep of proposed entry site was accomplished following identification of landmarks for left shoulder injection posterior approach. Following identification of landmarks for shoulder injection posterior approach, a 22-gauge needle was inserted in the region of the left shoulder  and 2 cc of 0.25% bupivacaine with Kenalog was injected for left shoulder injection posterior approach. The patient tolerated the injection well.  Myoneural block injections of the trapezius musculature region Following Betadine prep of proposed entry site a 22-gauge needle was inserted into the trapezius musculature region and following negative aspiration 2 cc of 0.25% bupivacaine and Kenalog was injected into the trapezius musculature region 4   The patient tolerated the procedure well.  A total of 20 mg of Kenalog was utilized for the entire procedure.    PLAN:     Continue present medication oxycodone  F/U PCP Dr. Julieanne Cotton for evaliation of  BP and general medical  condition this week following steroid injection  F/U surgical evaluation. May consider pending follow-up evaluations  F/U with nephrologist as scheduled  F/U neurological evaluation. May consider pending follow-up evaluations  May consider radiofrequency rhizolysis or intraspinal procedures pending response to present treatment and F/U evaluation   Patient to call Pain Management Center should patient have concerns prior to scheduled return appointment.         Review of Systems     Objective:   Physical Exam        Assessment & Plan:

## 2016-03-07 NOTE — Progress Notes (Signed)
Safety precautions to be maintained throughout the outpatient stay will include: orient to surroundings, keep bed in low position, maintain call bell within reach at all times, provide assistance with transfer out of bed and ambulation.  

## 2016-03-08 ENCOUNTER — Telehealth: Payer: Self-pay | Admitting: *Deleted

## 2016-03-08 NOTE — Telephone Encounter (Signed)
No problems post procedure. 

## 2016-03-18 ENCOUNTER — Ambulatory Visit
Admission: RE | Admit: 2016-03-18 | Discharge: 2016-03-18 | Disposition: A | Discharge status: Home / Self Care | Payer: Medicare HMO | Source: Ambulatory Visit | Attending: Vascular Surgery | Admitting: Vascular Surgery

## 2016-03-18 ENCOUNTER — Other Ambulatory Visit: Payer: Self-pay | Admitting: Vascular Surgery

## 2016-03-18 ENCOUNTER — Encounter
Admission: RE | Disposition: A | Discharge status: Home / Self Care | Payer: Self-pay | Source: Ambulatory Visit | Attending: Vascular Surgery

## 2016-03-18 ENCOUNTER — Encounter: Payer: Self-pay | Admitting: Vascular Surgery

## 2016-03-18 DIAGNOSIS — I252 Old myocardial infarction: Secondary | ICD-10-CM | POA: Diagnosis not present

## 2016-03-18 DIAGNOSIS — E1122 Type 2 diabetes mellitus with diabetic chronic kidney disease: Secondary | ICD-10-CM | POA: Insufficient documentation

## 2016-03-18 DIAGNOSIS — Z8673 Personal history of transient ischemic attack (TIA), and cerebral infarction without residual deficits: Secondary | ICD-10-CM | POA: Insufficient documentation

## 2016-03-18 DIAGNOSIS — Z9889 Other specified postprocedural states: Secondary | ICD-10-CM | POA: Insufficient documentation

## 2016-03-18 DIAGNOSIS — F17210 Nicotine dependence, cigarettes, uncomplicated: Secondary | ICD-10-CM | POA: Diagnosis not present

## 2016-03-18 DIAGNOSIS — Z6836 Body mass index (BMI) 36.0-36.9, adult: Secondary | ICD-10-CM | POA: Diagnosis not present

## 2016-03-18 DIAGNOSIS — Z992 Dependence on renal dialysis: Secondary | ICD-10-CM | POA: Diagnosis not present

## 2016-03-18 DIAGNOSIS — F329 Major depressive disorder, single episode, unspecified: Secondary | ICD-10-CM | POA: Insufficient documentation

## 2016-03-18 DIAGNOSIS — T82858A Stenosis of vascular prosthetic devices, implants and grafts, initial encounter: Secondary | ICD-10-CM | POA: Insufficient documentation

## 2016-03-18 DIAGNOSIS — I120 Hypertensive chronic kidney disease with stage 5 chronic kidney disease or end stage renal disease: Secondary | ICD-10-CM | POA: Insufficient documentation

## 2016-03-18 DIAGNOSIS — E7800 Pure hypercholesterolemia, unspecified: Secondary | ICD-10-CM | POA: Insufficient documentation

## 2016-03-18 DIAGNOSIS — I2510 Atherosclerotic heart disease of native coronary artery without angina pectoris: Secondary | ICD-10-CM | POA: Insufficient documentation

## 2016-03-18 DIAGNOSIS — J449 Chronic obstructive pulmonary disease, unspecified: Secondary | ICD-10-CM | POA: Diagnosis not present

## 2016-03-18 DIAGNOSIS — E669 Obesity, unspecified: Secondary | ICD-10-CM | POA: Diagnosis not present

## 2016-03-18 DIAGNOSIS — Y832 Surgical operation with anastomosis, bypass or graft as the cause of abnormal reaction of the patient, or of later complication, without mention of misadventure at the time of the procedure: Secondary | ICD-10-CM | POA: Insufficient documentation

## 2016-03-18 DIAGNOSIS — N186 End stage renal disease: Secondary | ICD-10-CM | POA: Diagnosis not present

## 2016-03-18 HISTORY — PX: PERIPHERAL VASCULAR CATHETERIZATION: SHX172C

## 2016-03-18 SURGERY — DIALYSIS/PERMA CATHETER REMOVAL
Anesthesia: Moderate Sedation

## 2016-03-18 MED ORDER — BACITRACIN-NEOMYCIN-POLYMYXIN 400-5-5000 EX OINT
TOPICAL_OINTMENT | CUTANEOUS | Status: AC
  Filled 2016-03-18: qty 1

## 2016-03-18 SURGICAL SUPPLY — 2 items
TRAY LACERAT/PLASTIC (MISCELLANEOUS) ×2 IMPLANT
TRAY SUT REMOVAL LITTAUER SCS (KITS) ×2 IMPLANT

## 2016-03-18 NOTE — Discharge Instructions (Signed)

## 2016-03-18 NOTE — Op Note (Signed)
  OPERATIVE NOTE   PROCEDURE: 1. Removal of a right IJ tunneled dialysis catheter  PRE-OPERATIVE DIAGNOSIS: Complication of dialysis catheter, End stage renal disease  POST-OPERATIVE DIAGNOSIS: Same  SURGEON: Jedaiah Rathbun, Dolores Lory, M.D.  ANESTHESIA: Local anesthetic with 1% lidocaine with epinephrine   ESTIMATED BLOOD LOSS: Minimal   FINDING(S): 1. Catheter intact   SPECIMEN(S):  Catheter  INDICATIONS:   Kimberly Shaffer is a 65 y.o. female who presents with nonfunctioning catheter.  The patient has undergone placement of an extremity access which is working and this has been successfully cannulated without difficulty.  therefore is undergoing removal of his tunneled catheter which is no longer needed to avoid septic complications.   DESCRIPTION: After obtaining full informed written consent, the patient was positioned supine. The right IJ catheter and surrounding area is prepped and draped in a sterile fashion. The cuff was localized by palpation and noted to be less than 3 cm from the exit site. After appropriate timeout is called, 1% lidocaine with epinephrine is infiltrated into the surrounding tissues around the cuff. Small transverse incision is created at the exit site with an 11 blade scalpel and the dissection was carried up along the catheter to expose the cuff of the tunneled catheter.  The catheter cuff is then freed from the surrounding attachments and adhesions. Once the catheter has been freed circumferentially it is removed in 1 piece. Light pressure was held at the base of the neck.   Antibiotic ointment and a sterile dressing is applied to the exit site. Patient tolerated procedure well and there were no complications.  COMPLICATIONS: None  CONDITION: Unchanged  Tzion Wedel, Dolores Lory, M.D. [facility] Office: [phone]  03/18/2016,12:54 PM

## 2016-03-18 NOTE — H&P (Signed)
[facility] Admission History & Physical  MRN : [id number]  Kimberly Shaffer is a 65 y.o. (01/12/1951) female who presents with chief complaint of need to have my catheter removed.  History of Present Illness: Patient is sent by the dialysis center for removal of the catheter. Catheter is not functioning at this time. She does have upper extremity access. I therefore been requested to remove the catheter to prevent life threatening sepsis.  Patient denies fever chills. She has not missed any dialysis runs.  No current facility-administered medications for this encounter.    Past Medical History  Diagnosis Date  . Myocardial infarction ([place])   . COPD (chronic obstructive pulmonary disease) ([place])   . Hypertension   . Obesity   . Depression   . Hypercholesteremia   . H/O blood clots   . Stroke ([place])   . H/O angina pectoris   . CAD (coronary artery disease)   . Diabetes mellitus without complication ([facility])     NIDD  . Vertigo, aural   . Renal insufficiency   . Asthma     Past Surgical History  Procedure Laterality Date  . Cardiac catheterization    . Coronary stent placement    . Knee surgery Bilateral   . Replacement total knee bilateral Bilateral   . Coronary artery bypass graft  2012  . Joint replacement    . Av fistula placement Left 11-04-2015  . Av fistula placement Left 11/04/2015    Procedure: ARTERIOVENOUS (AV) FISTULA CREATION  ( BRACHIAL CEPHALIC );  Surgeon: Katha Cabal, MD;  Location: [facility];  Service: Vascular;  Laterality: Left;  . Peripheral vascular catheterization N/A 11/13/2015    Procedure: Dialysis/Perma Catheter Insertion;  Surgeon: Katha Cabal, MD;  Location: [facility];  Service: Cardiovascular;  Laterality: N/A;  . Peripheral vascular catheterization N/A 12/04/2015    Procedure: Dialysis/Perma Catheter Insertion;  Surgeon: Katha Cabal, MD;  Location: [facility];  Service: Cardiovascular;   Laterality: N/A;  . Peripheral vascular catheterization Left 12/31/2015    Procedure: A/V Shuntogram/Fistulagram;  Surgeon: Algernon Huxley, MD;  Location: [facility];  Service: Cardiovascular;  Laterality: Left;  . Peripheral vascular catheterization N/A 12/31/2015    Procedure: A/V Shunt Intervention;  Surgeon: Algernon Huxley, MD;  Location: [facility];  Service: Cardiovascular;  Laterality: N/A;  . Peripheral vascular catheterization Left 02/25/2016    Procedure: A/V Shuntogram/Fistulagram;  Surgeon: Algernon Huxley, MD;  Location: [facility];  Service: Cardiovascular;  Laterality: Left;    Social History Social History  Substance Use Topics  . Smoking status: Current Every Day Smoker -- 0.50 packs/day    Types: Cigarettes  . Smokeless tobacco: Not on file  . Alcohol Use: No    Family History Family History  Problem Relation Age of Onset  . Cancer Mother   . Cancer Father   . Diabetes Brother   . Heart disease Brother   No family history of bleeding clotting disorders porphyria or autoimmune disease  Allergies  Allergen Reactions  . Niaspan [Niacin]   . Nystatin Hives and Itching  . Sulfa Antibiotics Swelling and Hives     REVIEW OF SYSTEMS (Negative unless checked)  Constitutional: [] Weight loss  [] Fever  [] Chills Cardiac: [] Chest pain   [] Chest pressure   [] Palpitations   [] Shortness of breath when laying flat   [] Shortness of breath at rest   [] Shortness of breath with exertion. Vascular:  []   Pain in legs with walking   [] Pain in legs at rest   [] Pain in legs when laying flat   [] Claudication   [] Pain in feet when walking  [] Pain in feet at rest  [] Pain in feet when laying flat   [] History of DVT   [] Phlebitis   [] Swelling in legs   [] Varicose veins   [] Non-healing ulcers Pulmonary:   [] Uses home oxygen   [] Productive cough   [] Hemoptysis   [] Wheeze  [] COPD   [] Asthma Neurologic:  [] Dizziness  [] Blackouts   [] Seizures   [] History of stroke   [] History of  TIA  [] Aphasia   [] Temporary blindness   [] Dysphagia   [] Weakness or numbness in arms   [] Weakness or numbness in legs Musculoskeletal:  [] Arthritis   [] Joint swelling   [] Joint pain   [] Low back pain Hematologic:  [] Easy bruising  [] Easy bleeding   [] Hypercoagulable state   [] Anemic  [] Hepatitis Gastrointestinal:  [] Blood in stool   [] Vomiting blood  [] Gastroesophageal reflux/heartburn   [] Difficulty swallowing. Genitourinary:  [] Chronic kidney disease   [] Difficult urination  [] Frequent urination  [] Burning with urination   [] Blood in urine Skin:  [] Rashes   [] Ulcers   [] Wounds Psychological:  [] History of anxiety   []  History of major depression.  Physical Examination  Filed Vitals:   03/18/16 1029  BP: 125/71  Pulse: 67  Temp: 98.3 F (36.8 C)  TempSrc: Oral  Resp: 17  Height: 5\' 6"  (1.676 m)  Weight: 103.42 kg (228 lb)  SpO2: 97%   Body mass index is 36.82 kg/(m^2). Gen: WD/WN, NAD Head: [place]/AT, No temporalis wasting. Prominent temp pulse not noted. Ear/Nose/Throat: Hearing grossly intact, nares w/o erythema or drainage, oropharynx w/o Erythema/Exudate,  Eyes: PERRLA, EOMI.  Neck: Supple, no nuchal rigidity.  No bruit or JVD.  Pulmonary:  Good air movement, clear to auscultation bilaterally, no use of accessory muscles.  Cardiac: RRR, normal S1, S2, no Murmurs, rubs or gallops. Vascular: AV access with good thrill good bruit; catheter is intact no purulent drainage nontender Gastrointestinal: soft, non-tender/non-distended. No guarding/reflex.  Musculoskeletal: M/S 5/5 throughout.  Extremities without ischemic changes.  No deformity or atrophy.  Neurologic: CN 2-12 intact. Pain and light touch intact in extremities.  Symmetrical.  Speech is fluent. Motor exam as listed above. Psychiatric: Judgment intact, Mood & affect appropriate for pt's clinical situation. Dermatologic: No rashes or ulcers noted.  No cellulitis or open wounds. Lymph : No Cervical, Axillary, or Inguinal  lymphadenopathy.    CBC Lab Results  Component Value Date   WBC 4.9 12/20/2015   HGB 9.0* 12/20/2015   HCT 27.7* 12/20/2015   MCV 88.0 12/20/2015   PLT 144* 12/20/2015    BMET    Component Value Date/Time   NA 133* 12/20/2015 0520   NA 140 04/09/2014 0404   K 3.3* 12/20/2015 0520   K 4.5 04/09/2014 0404   CL 98* 12/20/2015 0520   CL 113* 04/09/2014 0404   CO2 23 12/20/2015 0520   CO2 20* 04/09/2014 0404   GLUCOSE 109* 12/20/2015 0520   GLUCOSE 86 04/09/2014 0404   BUN 33* 12/20/2015 0520   BUN 36* 04/09/2014 0404   CREATININE 6.35* 12/20/2015 0520   CREATININE 3.34* 04/09/2014 0404   CALCIUM 8.5* 12/20/2015 0520   CALCIUM 8.9 04/09/2014 0404   GFRNONAA 6* 12/20/2015 0520   GFRNONAA 14* 04/09/2014 0404   GFRAA 7* 12/20/2015 0520   GFRAA 16* 04/09/2014 0404   CrCl cannot be calculated (Patient has no serum creatinine  result on file.).  COAG Lab Results  Component Value Date   INR 1.13 10/29/2015   INR 0.9 06/10/2013    Radiology No results found.  Assessment/Plan 1.  Complication dialysis device with nonfunctioning catheter associated with a functioning AV access:  Patient's  dialysis access is functioning and therefore the catheter should be removed to avoid septic complications. 2.  End-stage renal disease requiring hemodialysis:  Patient will continue dialysis therapy without further interruption 3.  Hypertension:  Patient will continue medical management; nephrology is following no changes in oral medications. 4. Diabetes mellitus:  Glucose will be monitored and oral medications been held this morning once the patient has undergone the patient's procedure po intake will be reinitiated and again Accu-Cheks will be used to assess the blood glucose level and treat as needed. The patient will be restarted on the patient's usual hypoglycemic regime 5.  Coronary artery disease:  EKG will be monitored. Nitrates will be used if needed. The patient's oral cardiac  medications will be continued.     Jaivion Kingsley, Dolores Lory, MD  03/18/2016 12:16 PM

## 2016-03-18 NOTE — H&P (Signed)
[facility] History & Physical Update  The patient was interviewed and re-examined.  The patient's previous History and Physical has been reviewed and is unchanged.  There is no change in the plan of care. We plan to proceed with the scheduled procedure.  Zarius Furr, Dolores Lory, MD  03/18/2016, 12:15 PM

## 2016-03-24 ENCOUNTER — Ambulatory Visit: Payer: Medicare HMO | Admitting: Pain Medicine

## 2016-03-25 ENCOUNTER — Ambulatory Visit: Payer: Medicare HMO | Attending: Pain Medicine | Admitting: Pain Medicine

## 2016-03-25 ENCOUNTER — Encounter: Payer: Self-pay | Admitting: Pain Medicine

## 2016-03-25 VITALS — BP 150/65 | HR 73 | Temp 98.1°F | Resp 18 | Ht 66.0 in | Wt 227.0 lb

## 2016-03-25 DIAGNOSIS — M170 Bilateral primary osteoarthritis of knee: Secondary | ICD-10-CM

## 2016-03-25 DIAGNOSIS — M25511 Pain in right shoulder: Secondary | ICD-10-CM | POA: Diagnosis present

## 2016-03-25 DIAGNOSIS — M19012 Primary osteoarthritis, left shoulder: Secondary | ICD-10-CM | POA: Insufficient documentation

## 2016-03-25 DIAGNOSIS — M5137 Other intervertebral disc degeneration, lumbosacral region: Secondary | ICD-10-CM | POA: Diagnosis not present

## 2016-03-25 DIAGNOSIS — M1288 Other specific arthropathies, not elsewhere classified, other specified site: Secondary | ICD-10-CM | POA: Diagnosis not present

## 2016-03-25 DIAGNOSIS — M7060 Trochanteric bursitis, unspecified hip: Secondary | ICD-10-CM

## 2016-03-25 DIAGNOSIS — M25512 Pain in left shoulder: Secondary | ICD-10-CM | POA: Diagnosis present

## 2016-03-25 DIAGNOSIS — M5126 Other intervertebral disc displacement, lumbar region: Secondary | ICD-10-CM | POA: Diagnosis not present

## 2016-03-25 DIAGNOSIS — M1610 Unilateral primary osteoarthritis, unspecified hip: Secondary | ICD-10-CM | POA: Insufficient documentation

## 2016-03-25 DIAGNOSIS — M47817 Spondylosis without myelopathy or radiculopathy, lumbosacral region: Secondary | ICD-10-CM

## 2016-03-25 DIAGNOSIS — M5116 Intervertebral disc disorders with radiculopathy, lumbar region: Secondary | ICD-10-CM | POA: Insufficient documentation

## 2016-03-25 DIAGNOSIS — M159 Polyosteoarthritis, unspecified: Secondary | ICD-10-CM

## 2016-03-25 DIAGNOSIS — Z96659 Presence of unspecified artificial knee joint: Secondary | ICD-10-CM | POA: Insufficient documentation

## 2016-03-25 DIAGNOSIS — Z96653 Presence of artificial knee joint, bilateral: Secondary | ICD-10-CM

## 2016-03-25 DIAGNOSIS — M5127 Other intervertebral disc displacement, lumbosacral region: Secondary | ICD-10-CM | POA: Diagnosis not present

## 2016-03-25 DIAGNOSIS — M51379 Other intervertebral disc degeneration, lumbosacral region without mention of lumbar back pain or lower extremity pain: Secondary | ICD-10-CM

## 2016-03-25 DIAGNOSIS — M172 Bilateral post-traumatic osteoarthritis of knee: Secondary | ICD-10-CM

## 2016-03-25 DIAGNOSIS — M5416 Radiculopathy, lumbar region: Secondary | ICD-10-CM

## 2016-03-25 DIAGNOSIS — M461 Sacroiliitis, not elsewhere classified: Secondary | ICD-10-CM

## 2016-03-25 DIAGNOSIS — M160 Bilateral primary osteoarthritis of hip: Secondary | ICD-10-CM

## 2016-03-25 DIAGNOSIS — M19011 Primary osteoarthritis, right shoulder: Secondary | ICD-10-CM | POA: Diagnosis not present

## 2016-03-25 DIAGNOSIS — M150 Primary generalized (osteo)arthritis: Secondary | ICD-10-CM

## 2016-03-25 DIAGNOSIS — M533 Sacrococcygeal disorders, not elsewhere classified: Secondary | ICD-10-CM

## 2016-03-25 MED ORDER — OXYCODONE HCL 10 MG PO TABS: ORAL_TABLET | ORAL | Status: DC

## 2016-03-25 NOTE — Progress Notes (Signed)
   Subjective:    Patient ID: Kimberly Shaffer, female    DOB: 10/15/1950, 65 y.o.   MRN: [id number]  HPI  The patient is a 65 year old female who returns to pain management for further evaluation and treatment of pain involving the shoulders neck entire back upper and lower extremity regions. The patient has had significant improvement of pain involving the shoulder status post interventional treatment. The patient states that she is able to perform most activities of daily living without his parents severely incapacitating pain of the shoulder. The patient does admit to some mild to moderate pain pain involving the region of the lower back regions. The patient is without trauma change in events of daily living because no changes of the pathology. We'll continue oxycodone at this time and we will remain available to consider additional modifications of treatment regimen pending follow-up evaluation. All agreed to suggested treatment plan.   Review of Systems     Objective:   Physical Exam  There was tenderness of the splenius capitis and cervical facet region a moderate degree with tenderness over the thoracic region thoracic facet region of moderate degree. Palpation over the region of the acromioclavicular and glenohumeral joint regions reproduce mild to moderate discomfort and patient was able to perform drop test without significant difficulty. Tinel and Phalen's maneuver were without increased pain of significant degree. Palpation over the lumbar region was attends to palpation of moderate degree with lateral bending rotation extension and palpation of the lumbar facets reproducing moderate discomfort with moderate tenderness of the PSIS and PII S region as well as the gluteal and piriformis musculature region. There was mild to moderate tenderness of the greater trochanteric region iliotibial band region. Straight leg raising was tolerates approximately 20 without increased pain with dorsiflexion  noted. EHL strength appeared to be slightly decreased without a definite sensory deficit or dermatomal distribution detected. There was negative clonus negative Homans. Abdomen nontender with no costovertebral tenderness noted          Assessment & Plan:     Degenerative joint disease of shoulders  Lumbar radiculopathy  Degenerative joint disease of hip  Degenerative joint disease of knees  Status post total knee replacement  Degenerative disc disease lumbar spine   L1 to, L2-3, L3-4, L4-5, and L5-S1 with multilevel degenerative disc disease, disc bulging, broad-based disc bulging, left paracentral disc protrusion, superior margination of disc material, bilateral facet arthropathy, L1-2 and L2-3 and L3-4 with bilateral facet arthropathy as well as L4-5 and L5-S1.        PLAN   Continue present medication oxycodone  F/U PCP Dr. Julieanne Cotton  for evaliation of  BP and general medical  condition  F/U surgical evaluation as discussed  F/U ENT  F/U neurological evaluation. May consider  F/U with vascular surgery   Appointment with Dr. Holley Raring  as discussed  F/U Drs. [place] as discussed  May consider radiofrequency rhizolysis or intraspinal procedures pending response to present treatment and F/U evaluation . We will avoid such procedures at this time  Patient to call Pain Management Center should patient have concerns prior to scheduled return appointment.

## 2016-03-25 NOTE — Progress Notes (Signed)
Safety precautions to be maintained throughout the outpatient stay will include: orient to surroundings, keep bed in low position, maintain call bell within reach at all times, provide assistance with transfer out of bed and ambulation.  

## 2016-03-25 NOTE — Patient Instructions (Signed)
PLAN   Continue present medication oxycodone  F/U PCP Dr. Julieanne Cotton  for evaliation of  BP and general medical  condition  F/U surgical evaluation as discussed  F/U ENT  F/U neurological evaluation. May consider  F/U with vascular surgery for revision of fistula as scheduled  Appointment with Dr. Holley Raring  as discussed  F/U Drs. [place] as discussed  May consider radiofrequency rhizolysis or intraspinal procedures pending response to present treatment and F/U evaluation . We will avoid such procedures at this time  Patient to call Pain Management Center should patient have concerns prior to scheduled return appointment.

## 2016-04-07 ENCOUNTER — Telehealth: Payer: Self-pay | Admitting: *Deleted

## 2016-04-07 ENCOUNTER — Other Ambulatory Visit: Payer: Self-pay | Admitting: Pain Medicine

## 2016-04-07 DIAGNOSIS — M47817 Spondylosis without myelopathy or radiculopathy, lumbosacral region: Secondary | ICD-10-CM

## 2016-04-07 DIAGNOSIS — M5416 Radiculopathy, lumbar region: Secondary | ICD-10-CM

## 2016-04-07 DIAGNOSIS — M461 Sacroiliitis, not elsewhere classified: Secondary | ICD-10-CM

## 2016-04-07 DIAGNOSIS — M5137 Other intervertebral disc degeneration, lumbosacral region: Secondary | ICD-10-CM

## 2016-04-07 DIAGNOSIS — M159 Polyosteoarthritis, unspecified: Secondary | ICD-10-CM

## 2016-04-07 DIAGNOSIS — M533 Sacrococcygeal disorders, not elsewhere classified: Secondary | ICD-10-CM

## 2016-04-07 DIAGNOSIS — Z96653 Presence of artificial knee joint, bilateral: Secondary | ICD-10-CM

## 2016-04-07 DIAGNOSIS — M19012 Primary osteoarthritis, left shoulder: Secondary | ICD-10-CM

## 2016-04-07 DIAGNOSIS — M160 Bilateral primary osteoarthritis of hip: Secondary | ICD-10-CM

## 2016-04-07 DIAGNOSIS — M19011 Primary osteoarthritis, right shoulder: Secondary | ICD-10-CM

## 2016-04-07 DIAGNOSIS — M7060 Trochanteric bursitis, unspecified hip: Secondary | ICD-10-CM

## 2016-04-07 DIAGNOSIS — M150 Primary generalized (osteo)arthritis: Secondary | ICD-10-CM

## 2016-04-07 DIAGNOSIS — M172 Bilateral post-traumatic osteoarthritis of knee: Secondary | ICD-10-CM

## 2016-04-07 DIAGNOSIS — M170 Bilateral primary osteoarthritis of knee: Secondary | ICD-10-CM

## 2016-04-07 NOTE — Telephone Encounter (Signed)
Attempted to call patient, mailbox is full. 

## 2016-04-07 NOTE — Telephone Encounter (Signed)
Pt called stating she needs an injection in her right shoulder due to she can't lift it up. Please give pt a call...thanks

## 2016-04-07 NOTE — Telephone Encounter (Signed)
Nurses Please call patient and describe patient's pain to me. Also ask patient if she has had any trauma Please discussed with me.  Thank you

## 2016-04-08 ENCOUNTER — Other Ambulatory Visit: Payer: Self-pay | Admitting: Pain Medicine

## 2016-04-08 ENCOUNTER — Telehealth: Payer: Self-pay | Admitting: *Deleted

## 2016-04-08 NOTE — Telephone Encounter (Signed)
Pain is in right shoulder. No new injury. Same pain as always, but has gotten worse. Has taken extra pain meds.

## 2016-04-08 NOTE — Telephone Encounter (Signed)
Patient called c/o right shoulder pain, similar to the pain she was having in the left.  This pain goes from the right shoulder joint down into the deltoid area of the right arm.  She is wanting to be scheduled for a shoulder injection like she had on the other shoulder because that seemed to help her a lot.

## 2016-04-08 NOTE — Telephone Encounter (Signed)
Nurses Rozanna Box  and  [facility]  Please schedule patient for shoulder injection for 7/24 or 04/13/16 if ok with insurance  Thank you

## 2016-04-11 NOTE — Telephone Encounter (Signed)
No prior auth reqd for shoulder injection 20610 Scheduled 04/13/2016 @ 1245pm with patient

## 2016-04-13 ENCOUNTER — Encounter: Payer: Self-pay | Admitting: Pain Medicine

## 2016-04-13 ENCOUNTER — Ambulatory Visit: Payer: Medicare HMO | Attending: Pain Medicine | Admitting: Pain Medicine

## 2016-04-13 VITALS — BP 125/60 | HR 72 | Temp 98.4°F | Resp 18 | Ht 66.0 in | Wt 227.0 lb

## 2016-04-13 DIAGNOSIS — M47817 Spondylosis without myelopathy or radiculopathy, lumbosacral region: Secondary | ICD-10-CM

## 2016-04-13 DIAGNOSIS — M19012 Primary osteoarthritis, left shoulder: Secondary | ICD-10-CM

## 2016-04-13 DIAGNOSIS — M5416 Radiculopathy, lumbar region: Secondary | ICD-10-CM

## 2016-04-13 DIAGNOSIS — M159 Polyosteoarthritis, unspecified: Secondary | ICD-10-CM

## 2016-04-13 DIAGNOSIS — M19011 Primary osteoarthritis, right shoulder: Secondary | ICD-10-CM

## 2016-04-13 DIAGNOSIS — M25511 Pain in right shoulder: Secondary | ICD-10-CM | POA: Diagnosis present

## 2016-04-13 DIAGNOSIS — M170 Bilateral primary osteoarthritis of knee: Secondary | ICD-10-CM

## 2016-04-13 DIAGNOSIS — M150 Primary generalized (osteo)arthritis: Secondary | ICD-10-CM

## 2016-04-13 DIAGNOSIS — M7060 Trochanteric bursitis, unspecified hip: Secondary | ICD-10-CM

## 2016-04-13 DIAGNOSIS — M5137 Other intervertebral disc degeneration, lumbosacral region: Secondary | ICD-10-CM

## 2016-04-13 MED ORDER — TRIAMCINOLONE ACETONIDE 40 MG/ML IJ SUSP
INTRAMUSCULAR | Status: AC
  Administered 2016-04-13: 14:00:00
  Filled 2016-04-13: qty 1

## 2016-04-13 MED ORDER — ORPHENADRINE CITRATE 30 MG/ML IJ SOLN
INTRAMUSCULAR | Status: AC
  Filled 2016-04-13: qty 2

## 2016-04-13 MED ORDER — SODIUM CHLORIDE 0.9% FLUSH
20.0000 mL | Freq: Once | INTRAVENOUS | Status: DC
Start: 2016-04-13 — End: 2016-07-09

## 2016-04-13 MED ORDER — BUPIVACAINE HCL (PF) 0.25 % IJ SOLN
INTRAMUSCULAR | Status: AC
  Administered 2016-04-13: 14:00:00
  Filled 2016-04-13: qty 30

## 2016-04-13 MED ORDER — CEFUROXIME AXETIL 250 MG PO TABS: 250.0000 mg | ORAL_TABLET | Freq: Two times a day (BID) | ORAL | 0 refills | Status: DC

## 2016-04-13 MED ORDER — TRIAMCINOLONE ACETONIDE 40 MG/ML IJ SUSP: 40.0000 mg | Freq: Once | INTRAMUSCULAR | Status: DC

## 2016-04-13 MED ORDER — DICLOFENAC EPOLAMINE 1.3 % TD PTCH: MEDICATED_PATCH | TRANSDERMAL | 0 refills | Status: DC

## 2016-04-13 MED ORDER — BUPIVACAINE HCL (PF) 0.25 % IJ SOLN: 30.0000 mL | Freq: Once | INTRAMUSCULAR | Status: DC

## 2016-04-13 NOTE — Patient Instructions (Addendum)
PLAN   Continue present medication oxycodone. Please obtain Ceftin antibiotic today and begin taking Ceftin antibiotic today as prescribed   Begin Flector patch and apply to painful area of shoulder and upper back as prescribed and if tolerated  F/U PCP Dr. Julieanne Cotton  for evaluation of  BP and general medical  condition  F/U surgical evaluation as discussed. Further evaluation of shoulder as discussed  F/U ENT  F/U neurological evaluation. May consider PNCV EMG studies and other studies  F/U with vascular as discussed  Appointment with Dr. Holley Raring  for follow-up evaluation  F/U Drs. Callwood Moses Manners, and Parachos as discussed  May consider radiofrequency rhizolysis or intraspinal procedures pending response to present treatment and F/U evaluation . We will avoid such procedures at this time  Patient to call Pain Management Center should patient have concerns prior to scheduled return appointment.Pain Management Discharge Instructions  General Discharge Instructions :  If you need to reach your doctor call: Monday-Friday 8:00 am - 4:00 pm at [phone] or toll free [phone].  After clinic hours [phone] to have operator reach doctor.  Bring all of your medication bottles to all your appointments in the pain clinic.  To cancel or reschedule your appointment with Pain Management please remember to call 24 hours in advance to avoid a fee.  Refer to the educational materials which you have been given on: General Risks, I had my Procedure. Discharge Instructions, Post Sedation.  Post Procedure Instructions:  The drugs you were given will stay in your system until tomorrow, so for the next 24 hours you should not drive, make any legal decisions or drink any alcoholic beverages.  You may eat anything you prefer, but it is better to start with liquids then soups and crackers, and gradually work up to solid foods.  Please notify your doctor immediately if you have  any unusual bleeding, trouble breathing or pain that is not related to your normal pain.  Depending on the type of procedure that was done, some parts of your body may feel week and/or numb.  This usually clears up by tonight or the next day.  Walk with the use of an assistive device or accompanied by an adult for the 24 hours.  You may use ice on the affected area for the first 24 hours.  Put ice in a Ziploc bag and cover with a towel and place against area 15 minutes on 15 minutes off.  You may switch to heat after 24 hours.

## 2016-04-13 NOTE — Progress Notes (Signed)
                                                     RIGHT SHOULDER INJECTION    The patient is a 65 -year-old female who returns to pain management for further evaluation and treatment of pain involving the region of the right shoulder. The patient is with prior studies revealing patient to be with severe degenerative changes of the shoulder. The risks benefits and expectations of procedure have been discussed and explained to patient who is with understanding and wishes to proceed with procedure in attempt to decrease severity of symptoms, minimize progression of symptoms, and avoid the need for more involved treatment are in agreement to proceed with shoulder injection as planned    Description Of Procedure:  Right Shoulder Injection (Anterior Approach)  With the patient in sitting position with EKG, blood pressure, pulse, and pulse oximetry monitoring all in place, Betadine prep of proposed entry site was accomplished following identification of landmarks for right shoulder injection anterior approach. Following identification of landmarks for shoulder injection anterior approach, a 22-gauge needle was inserted in the anterior region of the right shoulder and 2 cc of 0.25% bupivacaine with Kenalog was injected for right shoulder injection anterior approach. The patient tolerated the injection well  Description Of Procedure  Right Shoulder Injection (Posterior Approach)  With the patient in the sitting position with EKG, blood pressure, pulse, and pulse oximetry monitoring all in place, Betadine prep of proposed entry site was accomplished following identification of landmarks for right shoulder injection posterior approach. Following identification of landmarks for shoulder injection posterior approach, a 22-gauge needle was inserted in the region of the right shoulder and 2 cc of 0.25% bupivacaine with Kenalog was injected for right shoulder injection posterior approach. The patient tolerated  the injection well.  Myoneural block injections  Following Betadine prep of proposed entry site a 22-gauge needle was inserted in the region of the trapezius muscle and following negative aspiration 2 cc of 0.25% bupivacaine with Kenalog was injected for myoneural block injection of the trapezius musculature region 2.  Following Betadine prep of proposed entry site a 22-gauge needle was inserted in the region of the deltoid musculature region and following negative aspiration 2 cc of 0.25% bupivacaine with Kenalog was injected for myoneural block injection of the deltoid musculature region 2   The patient tolerated the procedure well.  A total of 40 mg of Kenalog was utilized for the entire procedure.    PLAN:     Continue present medication oxycodone  F/U PCP  Dr. Julieanne Cotton for evaliation of  BP and general medical  condition  F/U surgical evaluation. May considerfurther orthopedic evaluation  pending follow-up evaluations  ENT follow-up evaluation  Vascular follow-up evaluation . Patient is undergoing hemodialysis and will undergo follow-up vascular evaluation regarding fistula  F/U Dr.Lateef as discussed and as planned  F/U Dr.Callwood , Moses Manners ,and   Parachos as discussed    F/U neurological evaluation. May consider pending follow-up evaluations  May consider radiofrequency rhizolysis or intraspinal procedures pending response to present treatment and F/U evaluation   Patient to call Pain Management Center should patient have concerns prior to scheduled return appointment.

## 2016-04-13 NOTE — Progress Notes (Signed)
Safety precautions to be maintained throughout the outpatient stay will include: orient to surroundings, keep bed in low position, maintain call bell within reach at all times, provide assistance with transfer out of bed and ambulation.  

## 2016-04-14 ENCOUNTER — Telehealth: Payer: Self-pay | Admitting: *Deleted

## 2016-04-14 NOTE — Telephone Encounter (Signed)
Attempted to reach patient, mail box full and can't accept messages at this time.

## 2016-04-15 ENCOUNTER — Emergency Department
Admission: EM | Admit: 2016-04-15 | Discharge: 2016-04-15 | Disposition: A | Discharge status: Home / Self Care | Payer: Medicare HMO | Attending: Emergency Medicine | Admitting: Emergency Medicine

## 2016-04-15 ENCOUNTER — Emergency Department: Payer: Medicare HMO

## 2016-04-15 ENCOUNTER — Encounter: Payer: Self-pay | Admitting: Emergency Medicine

## 2016-04-15 DIAGNOSIS — Y9389 Activity, other specified: Secondary | ICD-10-CM | POA: Insufficient documentation

## 2016-04-15 DIAGNOSIS — I10 Essential (primary) hypertension: Secondary | ICD-10-CM | POA: Insufficient documentation

## 2016-04-15 DIAGNOSIS — W010XXA Fall on same level from slipping, tripping and stumbling without subsequent striking against object, initial encounter: Secondary | ICD-10-CM | POA: Diagnosis not present

## 2016-04-15 DIAGNOSIS — Z7982 Long term (current) use of aspirin: Secondary | ICD-10-CM | POA: Insufficient documentation

## 2016-04-15 DIAGNOSIS — J449 Chronic obstructive pulmonary disease, unspecified: Secondary | ICD-10-CM | POA: Insufficient documentation

## 2016-04-15 DIAGNOSIS — S92312A Displaced fracture of first metatarsal bone, left foot, initial encounter for closed fracture: Secondary | ICD-10-CM | POA: Diagnosis not present

## 2016-04-15 DIAGNOSIS — J45909 Unspecified asthma, uncomplicated: Secondary | ICD-10-CM | POA: Diagnosis not present

## 2016-04-15 DIAGNOSIS — S92325A Nondisplaced fracture of second metatarsal bone, left foot, initial encounter for closed fracture: Secondary | ICD-10-CM | POA: Insufficient documentation

## 2016-04-15 DIAGNOSIS — Y92009 Unspecified place in unspecified non-institutional (private) residence as the place of occurrence of the external cause: Secondary | ICD-10-CM | POA: Insufficient documentation

## 2016-04-15 DIAGNOSIS — E119 Type 2 diabetes mellitus without complications: Secondary | ICD-10-CM | POA: Insufficient documentation

## 2016-04-15 DIAGNOSIS — Y999 Unspecified external cause status: Secondary | ICD-10-CM | POA: Diagnosis not present

## 2016-04-15 DIAGNOSIS — F17210 Nicotine dependence, cigarettes, uncomplicated: Secondary | ICD-10-CM | POA: Diagnosis not present

## 2016-04-15 DIAGNOSIS — S92335A Nondisplaced fracture of third metatarsal bone, left foot, initial encounter for closed fracture: Secondary | ICD-10-CM | POA: Insufficient documentation

## 2016-04-15 DIAGNOSIS — Z951 Presence of aortocoronary bypass graft: Secondary | ICD-10-CM | POA: Insufficient documentation

## 2016-04-15 DIAGNOSIS — I2510 Atherosclerotic heart disease of native coronary artery without angina pectoris: Secondary | ICD-10-CM | POA: Insufficient documentation

## 2016-04-15 DIAGNOSIS — S99922A Unspecified injury of left foot, initial encounter: Secondary | ICD-10-CM | POA: Diagnosis present

## 2016-04-15 DIAGNOSIS — S92332A Displaced fracture of third metatarsal bone, left foot, initial encounter for closed fracture: Secondary | ICD-10-CM

## 2016-04-15 DIAGNOSIS — Z96653 Presence of artificial knee joint, bilateral: Secondary | ICD-10-CM | POA: Diagnosis not present

## 2016-04-15 DIAGNOSIS — S92322A Displaced fracture of second metatarsal bone, left foot, initial encounter for closed fracture: Secondary | ICD-10-CM

## 2016-04-15 NOTE — ED Triage Notes (Signed)
Pt tripped over grandsons shoe. Reports something in left foot popped. Wants to be seen for left foot pain.

## 2016-04-15 NOTE — ED Notes (Signed)
Short leg posterior splint applied per Beverlee Nims, ED tech.

## 2016-04-15 NOTE — ED Provider Notes (Signed)
[facility] Emergency Department Provider Note  ____________________________________________  Time seen: Approximately 3:00 PM  I have reviewed the triage vital signs and the nursing notes.   HISTORY  Chief Complaint Foot Pain    HPI Kimberly Shaffer is a 65 y.o. female , NAD, presents to the emergency department with several hour history of left foot pain. Patient states she tripped over her grandson's shoe and heard and felt a pop in the left foot. States she has had significant pain since that time. Has noted some swelling. Has not been able to bear weight about the left foot. Denies any pain about the left lower extremity nor ankle. No open wounds or lacerations. Has had some mild swelling but no bruising. Denies any numbness, weakness, tingling. Denies head injury, LOC, headache, visual changes, chest pain, shortness of breath.   Past Medical History:  Diagnosis Date  . Asthma   . CAD (coronary artery disease)   . COPD (chronic obstructive pulmonary disease) ([place])   . Depression   . Diabetes mellitus without complication (HCC)    NIDD  . H/O angina pectoris   . H/O blood clots   . Hypercholesteremia   . Hypertension   . Myocardial infarction ([place])   . Obesity   . Renal insufficiency   . Stroke ([place])   . Vertigo, aural     Patient Active Problem List   Diagnosis Date Noted  . Chest pain at rest 12/17/2015  . Chest pain 12/17/2015  . DJD of shoulder 10/21/2015  . 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 Lumbar radiculopathy 05/14/2015  . 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 Lumbosacral facet joint syndrome 02/16/2015  . Greater trochanteric bursitis 02/16/2015  . DDD (degenerative disc disease), lumbosacral 01/20/2015  . 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 DJD (degenerative joint disease) shoulder 01/20/2015    Past Surgical History:  Procedure Laterality Date  . AV FISTULA PLACEMENT Left  11-04-2015  . AV FISTULA PLACEMENT Left 11/04/2015   Procedure: ARTERIOVENOUS (AV) FISTULA CREATION  ( BRACHIAL CEPHALIC );  Surgeon: Katha Cabal, MD;  Location: [facility] ORS;  Service: Vascular;  Laterality: Left;  . CARDIAC CATHETERIZATION    . CORONARY ARTERY BYPASS GRAFT  2012  . CORONARY STENT PLACEMENT    . JOINT REPLACEMENT    . KNEE SURGERY Bilateral   . PERIPHERAL VASCULAR CATHETERIZATION N/A 11/13/2015   Procedure: Dialysis/Perma Catheter Insertion;  Surgeon: Katha Cabal, MD;  Location: [place] CV LAB;  Service: Cardiovascular;  Laterality: N/A;  . PERIPHERAL VASCULAR CATHETERIZATION N/A 12/04/2015   Procedure: Dialysis/Perma Catheter Insertion;  Surgeon: Katha Cabal, MD;  Location: [place] [facility];  Service: Cardiovascular;  Laterality: N/A;  . PERIPHERAL VASCULAR CATHETERIZATION Left 12/31/2015   Procedure: A/V Shuntogram/Fistulagram;  Surgeon: Algernon Huxley, MD;  Location: [place] [facility];  Service: Cardiovascular;  Laterality: Left;  . PERIPHERAL VASCULAR CATHETERIZATION N/A 12/31/2015   Procedure: A/V Shunt Intervention;  Surgeon: Algernon Huxley, MD;  Location: [place] CV LAB;  Service: Cardiovascular;  Laterality: N/A;  . PERIPHERAL VASCULAR CATHETERIZATION Left 02/25/2016   Procedure: A/V Shuntogram/Fistulagram;  Surgeon: Algernon Huxley, MD;  Location: [place] CV LAB;  Service: Cardiovascular;  Laterality: Left;  . PERIPHERAL VASCULAR CATHETERIZATION N/A 03/18/2016   Procedure: Dialysis/Perma Catheter Removal;  Surgeon: Katha Cabal, MD;  Location: [place] [facility];  Service: Cardiovascular;  Laterality: N/A;  . REPLACEMENT TOTAL KNEE BILATERAL Bilateral  Prior to Admission medications   Medication Sig Start Date End Date Taking? Authorizing Provider  albuterol (PROVENTIL HFA;VENTOLIN HFA) 108 (90 BASE) MCG/ACT inhaler Inhale into the lungs every 4 (four) hours as needed for wheezing or shortness of breath. 2-4 puffs    Historical  Provider, MD  amLODipine (NORVASC) 10 MG tablet Take 10 mg by mouth daily.    Historical Provider, MD  aspirin 81 MG tablet Take 81 mg by mouth daily.    Historical Provider, MD  atorvastatin (LIPITOR) 80 MG tablet Take 80 mg by mouth daily.    Historical Provider, MD  calcium carbonate (OSCAL) 1500 (600 Ca) MG TABS tablet Take 600 mg of elemental calcium by mouth daily with breakfast.    Historical Provider, MD  cefUROXime (CEFTIN) 250 MG tablet Take 1 tablet (250 mg total) by mouth 2 (two) times daily with a meal. 04/13/16   Mohammed Kindle, MD  Cholecalciferol (VITAMIN D3) 5000 UNITS CAPS Take 1 tablet by mouth daily.    Historical Provider, MD  diclofenac (FLECTOR) 1.3 % PTCH Apply one patch for a portion of patch to painful area of skin for strain, sprain, and spasms of the upper back and shoulder twice per day if tolerated 04/13/16   Mohammed Kindle, MD  DULoxetine (CYMBALTA) 60 MG capsule Take 60 mg by mouth daily.    Historical Provider, MD  Fluticasone-Salmeterol (ADVAIR DISKUS) 250-50 MCG/DOSE AEPB Inhale 1 puff into the lungs 2 (two) times daily. 01/19/16 01/18/17  Laverle Hobby, MD  FOLIC ACID PO Take 1 mg by mouth daily.     Historical Provider, MD  ipratropium (ATROVENT) 0.02 % nebulizer solution Take 2.5 mLs (0.5 mg total) by nebulization every 6 (six) hours as needed for wheezing or shortness of breath. 01/19/16   Laverle Hobby, MD  lubiprostone (AMITIZA) 24 MCG capsule Take 24 mcg by mouth daily as needed for constipation.    Historical Provider, MD  meclizine (ANTIVERT) 25 MG tablet Take 25 mg by mouth 3 (three) times daily as needed for dizziness.    Historical Provider, MD  methocarbamol (ROBAXIN) 750 MG tablet Take 750 mg by mouth daily as needed for muscle spasms.    Historical Provider, MD  metoprolol succinate (TOPROL-XL) 100 MG 24 hr tablet Take 100 mg by mouth daily. Take with or immediately following a meal.    Historical Provider, MD  Multiple Vitamins-Minerals  (ONE-A-DAY WOMENS 50 PLUS PO) Take 1 tablet by mouth daily.    Historical Provider, MD  nitroGLYCERIN (NITROLINGUAL) 0.4 MG/SPRAY spray Place 1 spray under the tongue every 5 (five) minutes x 3 doses as needed for chest pain.    Historical Provider, MD  Omega-3 Fatty Acids (FISH OIL) 1000 MG CPDR Take by mouth once.    Historical Provider, MD  Oxycodone HCl 10 MG TABS Limit  1  tab po bid - qid if tolerated daily 03/25/16   Mohammed Kindle, MD  pantoprazole (PROTONIX) 40 MG tablet Take 40 mg by mouth 2 (two) times daily.    Historical Provider, MD  Pramoxine-Dimethicone 1-6 % CREA Apply topically.    Historical Provider, MD    Allergies Niaspan [niacin]; Nystatin; and Sulfa antibiotics  Family History  Problem Relation Age of Onset  . Cancer Mother   . Cancer Father   . Diabetes Brother   . Heart disease Brother     Social History Social History  Substance Use Topics  . Smoking status: Current Every Day Smoker    Packs/day: 0.50  Types: Cigarettes  . Smokeless tobacco: Not on file  . Alcohol use No     Review of Systems  Constitutional: No fatigue Eyes: No visual changes.  Cardiovascular: No chest pain. Respiratory:  No shortness of breath. Musculoskeletal: Positive left foot pain. Negative for left ankle, lower extremity, hip, back, neck pain.   Skin: Negative for rash, redness, swelling, bruising, open wounds or lacerations. Neurological: Negative for headaches, focal weakness or numbness. No tingling 10-point ROS otherwise negative.  ____________________________________________   PHYSICAL EXAM:  VITAL SIGNS: ED Triage Vitals [04/15/16 1356]  Enc Vitals Group     BP 116/81     Pulse Rate 84     Resp 18     Temp 98.2 F (36.8 C)     Temp Source Oral     SpO2 97 %     Weight 220 lb (99.8 kg)     Height 5\' 6"  (1.676 m)     Head Circumference      Peak Flow      Pain Score 10     Pain Loc      Pain Edu?      Excl. in [place]?      Constitutional: Alert and  oriented. Well appearing and in no acute distress. Eyes: Conjunctivae are normal.  Head: Atraumatic. Cardiovascular:  Good peripheral circulation with 2+ pulses noted in the left lower extremity. Capillary refill is brisk in all digits of the left foot. Respiratory: Normal respiratory effort without tachypnea or retractions.  Musculoskeletal: Tenderness to palpation about the dorsal portion of the left foot correlating with the first, second, third metatarsals. Full range of motion of the right toes without pain. Full range motion of the right ankle without pain. No lower extremity tenderness nor edema.  No joint effusions. Neurologic:  Normal speech and language. No gross focal neurologic deficits are appreciated. Sensation to light touch grossly intact about the left lower extremity. Skin:  Skin is warm, dry and intact. No rash, redness, bruising, open wounds or lacerations noted. Psychiatric: Mood and affect are normal. Speech and behavior are normal. Patient exhibits appropriate insight and judgement.   ____________________________________________   LABS  None ____________________________________________  EKG  None ____________________________________________  RADIOLOGY I have personally viewed and evaluated these images (plain radiographs) as part of my medical decision making, as well as reviewing the written report by the radiologist.  Dg Foot Complete Left  Result Date: 04/15/2016 CLINICAL DATA:  The patient tripped over a shoe yesterday and felt something pop in her left foot. EXAM: LEFT FOOT - COMPLETE 3+ VIEW COMPARISON:  Plain films left foot 02/07/2014. FINDINGS: The patient has a chip fracture through the superior, lateral corner of the proximal first metatarsal at its articulation with the medial cuneiform. Nondisplaced fractures of the proximal metaphysis of both the second and third metatarsals are also seen. Alignment of the tarsometatarsal joints is unremarkable. No  other fracture is identified. IMPRESSION: Fracture through the superior, lateral corner of the base of the first metatarsal extends to the articular surface. Nondisplaced fractures proximal metaphysis of both the second and third metatarsals are identified. Electronically Signed   By: Inge Rise M.D.   On: 04/15/2016 14:57   ____________________________________________    PROCEDURES  Procedure(s) performed: None   Procedures   Medications - No data to display   ____________________________________________   INITIAL IMPRESSION / ASSESSMENT AND PLAN / ED COURSE  Pertinent labs & imaging results that were available during my care of the patient  were reviewed by me and considered in my medical decision making (see chart for details).  Clinical Course    Patient's diagnosis is consistent with Fracture of the first, second, third metatarsals of the left foot.  Patient was placed in a posterior Ortho-Glass splint and given a walker to assist with ambulation. Patient is to follow up with Dr. Marry Guan in orthopedics in 3 days for further evaluation and treatment. Patient is given ED precautions to return to the ED for any worsening or new symptoms.      ____________________________________________  FINAL CLINICAL IMPRESSION(S) / ED DIAGNOSES  Final diagnoses:  Fracture of first metatarsal bone of left foot, closed, initial encounter  Fracture of second metatarsal bone of left foot, closed, initial encounter  Fracture of third metatarsal bone of left foot, closed, initial encounter      NEW MEDICATIONS STARTED DURING THIS VISIT:  Discharge Medication List as of 04/15/2016  3:08 PM           Braxton Feathers, PA-C 04/15/16 1548    Drenda Freeze, MD 04/15/16 2053

## 2016-04-15 NOTE — ED Notes (Signed)
Pt in via triage; pt reports a fall this morning at home, states she tripped over her grandsons shoe, coming down on her left foot and "hearing a pop."  Pt denies hitting head, denies LOC.  Pt reports she is unable to bear weight to that foot due to pain.  Pt A/Ox4, no immediate distress at this time.

## 2016-04-26 ENCOUNTER — Encounter: Payer: Self-pay | Admitting: Pain Medicine

## 2016-04-26 ENCOUNTER — Ambulatory Visit: Payer: Medicare HMO | Attending: Pain Medicine | Admitting: Pain Medicine

## 2016-04-26 VITALS — BP 109/74 | HR 89 | Temp 98.3°F | Resp 16 | Ht 66.5 in | Wt 228.0 lb

## 2016-04-26 DIAGNOSIS — M5127 Other intervertebral disc displacement, lumbosacral region: Secondary | ICD-10-CM | POA: Diagnosis not present

## 2016-04-26 DIAGNOSIS — Z96659 Presence of unspecified artificial knee joint: Secondary | ICD-10-CM | POA: Insufficient documentation

## 2016-04-26 DIAGNOSIS — M1610 Unilateral primary osteoarthritis, unspecified hip: Secondary | ICD-10-CM | POA: Diagnosis not present

## 2016-04-26 DIAGNOSIS — M1288 Other specific arthropathies, not elsewhere classified, other specified site: Secondary | ICD-10-CM | POA: Diagnosis not present

## 2016-04-26 DIAGNOSIS — M170 Bilateral primary osteoarthritis of knee: Secondary | ICD-10-CM | POA: Insufficient documentation

## 2016-04-26 DIAGNOSIS — M150 Primary generalized (osteo)arthritis: Secondary | ICD-10-CM

## 2016-04-26 DIAGNOSIS — M5126 Other intervertebral disc displacement, lumbar region: Secondary | ICD-10-CM | POA: Insufficient documentation

## 2016-04-26 DIAGNOSIS — M5116 Intervertebral disc disorders with radiculopathy, lumbar region: Secondary | ICD-10-CM | POA: Diagnosis not present

## 2016-04-26 DIAGNOSIS — M5137 Other intervertebral disc degeneration, lumbosacral region: Secondary | ICD-10-CM

## 2016-04-26 DIAGNOSIS — M7060 Trochanteric bursitis, unspecified hip: Secondary | ICD-10-CM

## 2016-04-26 DIAGNOSIS — S92902A Unspecified fracture of left foot, initial encounter for closed fracture: Secondary | ICD-10-CM | POA: Insufficient documentation

## 2016-04-26 DIAGNOSIS — M19011 Primary osteoarthritis, right shoulder: Secondary | ICD-10-CM

## 2016-04-26 DIAGNOSIS — M79606 Pain in leg, unspecified: Secondary | ICD-10-CM | POA: Diagnosis present

## 2016-04-26 DIAGNOSIS — M159 Polyosteoarthritis, unspecified: Secondary | ICD-10-CM

## 2016-04-26 DIAGNOSIS — M19012 Primary osteoarthritis, left shoulder: Secondary | ICD-10-CM | POA: Insufficient documentation

## 2016-04-26 DIAGNOSIS — M546 Pain in thoracic spine: Secondary | ICD-10-CM | POA: Diagnosis present

## 2016-04-26 MED ORDER — OXYCODONE HCL 10 MG PO TABS: ORAL_TABLET | ORAL | 0 refills | Status: DC

## 2016-04-26 NOTE — Patient Instructions (Addendum)
PLAN   Continue present medication oxycodone  F/U PCP Dr. Julieanne Cotton  for evaliation of  BP and general medical  condition  F/U surgical evaluation as discussed for further evaluation and treatment of fracture of left lower extremity (foot)  F/U ENT  F/U neurological evaluation. May consider  F/U with vascular surgery for revision of fistula as scheduled  F/U Dr. Holley Raring  F/U Drs. [place] as discussed  May consider radiofrequency rhizolysis or intraspinal procedures pending response to present treatment and F/U evaluation . We will avoid such procedures at this time  Patient to call Pain Management Center should patient have concerns prior to scheduled return appointment.

## 2016-04-26 NOTE — Progress Notes (Signed)
    The patient is a 65 year old female who returns to pain management for further evaluation and treatment of pain involving the shoulders entire back upper and lower extremity region and region of the neck. The patient has had significant improvement of pain involving the region of the shoulder status post interventional treatment performed consisting of shoulder injection. At the present time patient will continue her present treatment regimen including oxycodone. We will remain available to consider patient for lumbar sympathetic block of the procedure which can help in terms of improving the healing process of the fracture and avoid the potential for patient to develop complex regional pain syndrome of the lower extremity following fracture of the foot. We will await decision of patient and we'll continue medications as prescribed this time. All agreed to suggested treatment plan    Physical examination  There was tenderness of the splenius capitis and occipitalis region palpation which reproduces moderate discomfort. The patient was with moderate difficulty attempt to perform drop test. There was tenderness of the cervical facet cervical paraspinal muscular region as well as the splenius capitis and occipitalis regions. Palpation over the thoracic region was attends to palpation without crepitus of the thoracic region noted. The patient appeared to be with bilaterally equal grip strength and Tinel and Phalen's maneuver reproducing minimal discomfort. Palpation over the lumbar region was with moderate tenderness to palpation with lateral bending rotation extension and palpation of the lumbar facets reproducing moderate discomfort. Straight leg raising was decreased and tolerates approximately 20 the patient was with boot of the left lower extremity. There appeared to be good capillary refill of the toes of the left foot. There was movement of toes as well. There was tenderness of the knees with  well-healed surgical scars of the knees without increased warmth erythema of the knees. The patient is status post total knee replacement. EHL strength was questionably decreased on the right. And EHL testing was avoided on the left lower extremity. There was negative Homans and abdomen was nontender with no costovertebral tenderness noted     Assessment   Degenerative joint disease of shoulders  Lumbar radiculopathy  Degenerative joint disease of hip  Degenerative joint disease of knees  Status post total knee replacement  Degenerative disc disease lumbar spine   L1 to, L2-3, L3-4, L4-5, and L5-S1 with multilevel degenerative disc disease, disc bulging, broad-based disc bulging, left paracentral disc protrusion, superior margination of disc material, bilateral facet arthropathy, L1-2 and L2-3 and L3-4 with bilateral facet arthropathy as well as L4-5 and L5-S1.     PLAN   Continue present medication oxycodone  F/U PCP Dr. Julieanne Cotton  for evaliation of  BP and general medical  condition  F/U surgical evaluation as discussed for further evaluation and treatment of fracture of left lower extremity (foot)  F/U ENT  F/U neurological evaluation. May consider  F/U with vascular surgery for revision of fistula as scheduled  F/U Dr. Holley Raring  F/U Drs. [place] as discussed  May consider radiofrequency rhizolysis or intraspinal procedures pending response to present treatment and F/U evaluation . We will avoid such procedures at this time  Patient to call Pain Management Center should patient have concerns prior to scheduled return appointment.

## 2016-04-26 NOTE — Progress Notes (Signed)
Safety precautions to be maintained throughout the outpatient stay will include: orient to surroundings, keep bed in low position, maintain call bell within reach at all times, provide assistance with transfer out of bed and ambulation.  

## 2016-05-25 ENCOUNTER — Ambulatory Visit: Payer: Medicare HMO | Attending: Pain Medicine | Admitting: Pain Medicine

## 2016-05-25 ENCOUNTER — Encounter: Payer: Self-pay | Admitting: Pain Medicine

## 2016-05-25 VITALS — BP 131/73 | HR 72 | Temp 98.3°F | Resp 16 | Ht 66.0 in | Wt 228.0 lb

## 2016-05-25 DIAGNOSIS — R079 Chest pain, unspecified: Secondary | ICD-10-CM

## 2016-05-25 DIAGNOSIS — M159 Polyosteoarthritis, unspecified: Secondary | ICD-10-CM

## 2016-05-25 DIAGNOSIS — M19011 Primary osteoarthritis, right shoulder: Secondary | ICD-10-CM

## 2016-05-25 DIAGNOSIS — M47817 Spondylosis without myelopathy or radiculopathy, lumbosacral region: Secondary | ICD-10-CM

## 2016-05-25 DIAGNOSIS — M150 Primary generalized (osteo)arthritis: Secondary | ICD-10-CM

## 2016-05-25 DIAGNOSIS — Z96653 Presence of artificial knee joint, bilateral: Secondary | ICD-10-CM

## 2016-05-25 DIAGNOSIS — M5137 Other intervertebral disc degeneration, lumbosacral region: Secondary | ICD-10-CM

## 2016-05-25 DIAGNOSIS — S92902D Unspecified fracture of left foot, subsequent encounter for fracture with routine healing: Secondary | ICD-10-CM

## 2016-05-25 DIAGNOSIS — M5416 Radiculopathy, lumbar region: Secondary | ICD-10-CM

## 2016-05-25 DIAGNOSIS — M19012 Primary osteoarthritis, left shoulder: Secondary | ICD-10-CM

## 2016-05-25 DIAGNOSIS — M170 Bilateral primary osteoarthritis of knee: Secondary | ICD-10-CM

## 2016-05-25 DIAGNOSIS — M7060 Trochanteric bursitis, unspecified hip: Secondary | ICD-10-CM

## 2016-05-25 MED ORDER — CEFUROXIME AXETIL 500 MG PO TABS: ORAL_TABLET | ORAL | 0 refills | Status: DC

## 2016-05-25 MED ORDER — OXYCODONE HCL 10 MG PO TABS: ORAL_TABLET | ORAL | 0 refills | Status: DC

## 2016-05-25 NOTE — Patient Instructions (Addendum)
PLAN   Continue present medication oxycodone and BEGIN CEFTIN antibiotic and follow-up with primary care physician today as discussed  F/U PCP replacing Dr. Julieanne Cotton  for evaluation of  BP and general medical  condition as well as to discuss condition of the thumb of the left upper extremity.. We prescribed Ceftin antibiotic for the thumb today  F/U surgical evaluation as discussed for further evaluation and treatment of fracture of left lower extremity (foot)  F/U ENT  F/U neurological evaluation. May consider  F/U with vascular surgery for revision of fistula as scheduled  F/U Dr. Holley Raring  F/U Drs. [place] as discussed  May consider radiofrequency rhizolysis or intraspinal procedures pending response to present treatment and F/U evaluation . We will avoid such procedures at this time  Patient to call [facility] should patient have concerns prior to scheduled return appointment.

## 2016-05-25 NOTE — Progress Notes (Signed)
Safety precautions to be maintained throughout the outpatient stay will include: orient to surroundings, keep bed in low position, maintain call bell within reach at all times, provide assistance with transfer out of bed and ambulation.  

## 2016-05-26 NOTE — Progress Notes (Signed)
The patient is a 65 year old female who returns to pain management for further evaluation and treatment of pain involving the neck upper back mid back lower back lower extremity region and shoulders. The patient states that she has had return of pain involving the shoulders which is aggravated by reaching and lifting pushing pulling maneuvers. The patient states that the lower back virtually pain is fairly well-controlled at this time the patient admits to some pain involving the region of the thumb. The patient denies any trauma to the thumb and states that the thumb is swollen and tender to palpation. We discussed patient's condition and of the thumb and there is concern regarding infection. The patient was given prescription for Ceftin and was informed to follow-up with her primary care physician later today as already scheduled. The patient agreed with recommendation. The patient continues medication as prescribed without undesirable side effects. The patient will undergo follow-up evaluation with her new primary care physician at [facility] primary care. The patient was previously under the care of Dr Rockney Ghee  . At the present time we will remain available to consider patient for interventional treatment as discussed and patient will undergo further evaluation of her general medical condition at this time. Patient states the pain is aggravated by reaching and lifting pushing pulling maneuvers and becomes more intense as the day progresses and patient is performing activities with the upper extremities. We will consider modifications of treatment regimen as discussed and as explained to patient on today's visit. All agreed to suggested treatment plan    Physical examination    There was tenderness to palpation of paraspinal musculature region the cervical region cervical facet region palpation which be produced pain of mild-to-moderate degree with mild to moderate tenderness of the splenius capitis and  occipitalis region. Palpation of the region of the acromioclavicular and glenohumeral joint region reproduces moderate discomfort. The patient had difficulty performing drop test to do pain involving the region of the shoulders. There was unremarkable Spurling's maneuver. Palpation of the thoracic region was with moderate muscle spasm without crepitus of the thoracic region noted. Palpation over the lumbar paraspinal must reason lumbar facet region was with moderate tenderness to palpation with lateral bending rotation extension and palpation of the lumbar facets reproducing moderate discomfort. EHL strength appeared to be decreased without sensory deficit or dermatomal distribution detected. Palpation over the PSIS and PII S region reproduced moderate discomfort There was well-healed surgical scar of the knee without increased warmth and erythema in the region of the knee. EHL strength appeared to be slightly decreased. There was negative clonus negative Homans. Abdomen nontender with no costovertebral tenderness noted      Assessment    Degenerative joint disease of shoulders  Infection of thumb  Lumbar radiculopathy  Degenerative joint disease of hip  Degenerative joint disease of knees  Status post total knee replacement  Degenerative disc disease lumbar spine   L1 to, L2-3, L3-4, L4-5, and L5-S1 with multilevel degenerative disc disease, disc bulging, broad-based disc bulging, left paracentral disc protrusion, superior margination of disc material, bilateral facet arthropathy, L1-2 and L2-3 and L3-4 with bilateral facet arthropathy as well as L4-5 and L5-S1.       PLAN   Continue present medication oxycodone and BEGIN CEFTIN antibiotic and follow-up with primary care physician today as discussed  F/U PCP replacing Dr. Julieanne Cotton  for evaluation of  BP and general medical  condition as well as to discuss condition of the thumb of the  left upper extremity.. We prescribed Ceftin  antibiotic for the thumb today  F/U surgical evaluation as discussed for further evaluation and treatment of fracture of left lower extremity (foot)  F/U ENT  F/U neurological evaluation. May consider  F/U with vascular surgery for revision of fistula as scheduled  F/U Dr. Holley Raring  F/U Drs. [place] as discussed  May consider radiofrequency rhizolysis or intraspinal procedures pending response to present treatment and F/U evaluation . We will avoid such procedures at this time  Patient to call [facility] should patient have concerns prior to scheduled return appointment.

## 2016-06-12 ENCOUNTER — Other Ambulatory Visit: Payer: Self-pay | Admitting: Pain Medicine

## 2016-06-13 ENCOUNTER — Other Ambulatory Visit: Payer: Self-pay | Admitting: Pain Medicine

## 2016-06-27 ENCOUNTER — Emergency Department
Admission: EM | Admit: 2016-06-27 | Discharge: 2016-06-27 | Disposition: A | Discharge status: Home / Self Care | Payer: Medicare HMO | Attending: Emergency Medicine | Admitting: Emergency Medicine

## 2016-06-27 ENCOUNTER — Encounter: Payer: Self-pay | Admitting: Emergency Medicine

## 2016-06-27 DIAGNOSIS — J45909 Unspecified asthma, uncomplicated: Secondary | ICD-10-CM | POA: Diagnosis not present

## 2016-06-27 DIAGNOSIS — Z792 Long term (current) use of antibiotics: Secondary | ICD-10-CM | POA: Diagnosis not present

## 2016-06-27 DIAGNOSIS — F17210 Nicotine dependence, cigarettes, uncomplicated: Secondary | ICD-10-CM | POA: Insufficient documentation

## 2016-06-27 DIAGNOSIS — I252 Old myocardial infarction: Secondary | ICD-10-CM | POA: Diagnosis not present

## 2016-06-27 DIAGNOSIS — E1122 Type 2 diabetes mellitus with diabetic chronic kidney disease: Secondary | ICD-10-CM | POA: Diagnosis not present

## 2016-06-27 DIAGNOSIS — J449 Chronic obstructive pulmonary disease, unspecified: Secondary | ICD-10-CM | POA: Insufficient documentation

## 2016-06-27 DIAGNOSIS — N186 End stage renal disease: Secondary | ICD-10-CM | POA: Insufficient documentation

## 2016-06-27 DIAGNOSIS — I120 Hypertensive chronic kidney disease with stage 5 chronic kidney disease or end stage renal disease: Secondary | ICD-10-CM | POA: Insufficient documentation

## 2016-06-27 DIAGNOSIS — M5431 Sciatica, right side: Secondary | ICD-10-CM | POA: Diagnosis not present

## 2016-06-27 DIAGNOSIS — Z7982 Long term (current) use of aspirin: Secondary | ICD-10-CM | POA: Diagnosis not present

## 2016-06-27 DIAGNOSIS — Z79899 Other long term (current) drug therapy: Secondary | ICD-10-CM | POA: Diagnosis not present

## 2016-06-27 DIAGNOSIS — Z79891 Long term (current) use of opiate analgesic: Secondary | ICD-10-CM | POA: Insufficient documentation

## 2016-06-27 DIAGNOSIS — Z992 Dependence on renal dialysis: Secondary | ICD-10-CM | POA: Diagnosis not present

## 2016-06-27 DIAGNOSIS — I2510 Atherosclerotic heart disease of native coronary artery without angina pectoris: Secondary | ICD-10-CM | POA: Diagnosis not present

## 2016-06-27 DIAGNOSIS — M545 Low back pain: Secondary | ICD-10-CM | POA: Diagnosis present

## 2016-06-27 LAB — URINALYSIS COMPLETE WITH MICROSCOPIC (ARMC ONLY)
BACTERIA UA: NONE SEEN
Bilirubin Urine: NEGATIVE
Glucose, UA: NEGATIVE mg/dL
HGB URINE DIPSTICK: NEGATIVE
Ketones, ur: NEGATIVE mg/dL
LEUKOCYTES UA: NEGATIVE
NITRITE: NEGATIVE
PROTEIN: 100 mg/dL — AB
SPECIFIC GRAVITY, URINE: 1.016 (ref 1.005–1.030)
pH: 5 (ref 5.0–8.0)

## 2016-06-27 MED ORDER — OXYCODONE HCL 5 MG PO TABS: 5.0000 mg | ORAL_TABLET | Freq: Three times a day (TID) | ORAL | 0 refills | Status: DC | PRN

## 2016-06-27 NOTE — ED Triage Notes (Signed)
Pt presents to ED with c/o lower back pain x 1 week, pt c/o foul odor to urine. Pt denies dysuria or hematuria. Pt is dialysis, reports does produce urine. Pt alert and oriented x 4, no increased work in breathing.

## 2016-06-27 NOTE — ED Notes (Signed)
States she developed lower back pain about 2 weeks ago. Denies any injury states pain radiates into both legs at times  Min relief with OTC meds

## 2016-06-27 NOTE — ED Provider Notes (Signed)
[facility] Emergency Department Provider Note ____________________________________________  Time seen: Approximately 7:01 PM  I have reviewed the triage vital signs and the nursing notes.   Kimberly  Chief Complaint Back Pain    HPI DELSIE Shaffer is a 65 y.o. female who presents to the emergency department for evaluation of back pain. She denies injury. She states her urine has been a little darker than usual and has a different odor. Pain is in the lower back and radiates around to the front of her right leg. Pain increases with position change. She is a dialysis patient. She has not missed any dialysis days and other than the back pain feels good. She has taken tylenol without relief.  Past Medical Kimberly:  Diagnosis Date  . Asthma   . CAD (coronary artery disease)   . COPD (chronic obstructive pulmonary disease) ([place])   . Depression   . Diabetes mellitus without complication (HCC)    NIDD  . H/O angina pectoris   . H/O blood clots   . Hypercholesteremia   . Hypertension   . Myocardial infarction   . Obesity   . Renal insufficiency   . Stroke ([place])   . Vertigo, aural     Patient Active Problem List   Diagnosis Date Noted  . Fracture of left foot 04/26/2016  . Chest pain at rest 12/17/2015  . Chest pain 12/17/2015  . DJD of shoulder 10/21/2015  . 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 Lumbar radiculopathy 05/14/2015  . 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 Lumbosacral facet joint syndrome 02/16/2015  . Greater trochanteric bursitis 02/16/2015  . DDD (degenerative disc disease), lumbosacral 01/20/2015  . 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 DJD (degenerative joint disease) shoulder 01/20/2015    Past Surgical Kimberly:  Procedure Laterality Date  . AV FISTULA PLACEMENT Left 11-04-2015  . AV FISTULA PLACEMENT Left 11/04/2015   Procedure: ARTERIOVENOUS (AV) FISTULA CREATION  ( BRACHIAL  CEPHALIC );  Surgeon: Katha Cabal, MD;  Location: [facility] ORS;  Service: Vascular;  Laterality: Left;  . CARDIAC CATHETERIZATION    . CORONARY ARTERY BYPASS GRAFT  2012  . CORONARY STENT PLACEMENT    . JOINT REPLACEMENT    . KNEE SURGERY Bilateral   . PERIPHERAL VASCULAR CATHETERIZATION N/A 11/13/2015   Procedure: Dialysis/Perma Catheter Insertion;  Surgeon: Katha Cabal, MD;  Location: [place] [facility];  Service: Cardiovascular;  Laterality: N/A;  . PERIPHERAL VASCULAR CATHETERIZATION N/A 12/04/2015   Procedure: Dialysis/Perma Catheter Insertion;  Surgeon: Katha Cabal, MD;  Location: [place] [facility];  Service: Cardiovascular;  Laterality: N/A;  . PERIPHERAL VASCULAR CATHETERIZATION Left 12/31/2015   Procedure: A/V Shuntogram/Fistulagram;  Surgeon: Algernon Huxley, MD;  Location: [place] [facility];  Service: Cardiovascular;  Laterality: Left;  . PERIPHERAL VASCULAR CATHETERIZATION N/A 12/31/2015   Procedure: A/V Shunt Intervention;  Surgeon: Algernon Huxley, MD;  Location: [place] [facility];  Service: Cardiovascular;  Laterality: N/A;  . PERIPHERAL VASCULAR CATHETERIZATION Left 02/25/2016   Procedure: A/V Shuntogram/Fistulagram;  Surgeon: Algernon Huxley, MD;  Location: [place] [facility];  Service: Cardiovascular;  Laterality: Left;  . PERIPHERAL VASCULAR CATHETERIZATION N/A 03/18/2016   Procedure: Dialysis/Perma Catheter Removal;  Surgeon: Katha Cabal, MD;  Location: [place] [facility];  Service: Cardiovascular;  Laterality: N/A;  . REPLACEMENT TOTAL KNEE BILATERAL Bilateral     Prior to Admission medications   Medication Sig Start Date End Date  Taking? Authorizing Provider  albuterol (PROVENTIL HFA;VENTOLIN HFA) 108 (90 BASE) MCG/ACT inhaler Inhale into the lungs every 4 (four) hours as needed for wheezing or shortness of breath. 2-4 puffs    Historical Provider, MD  amLODipine (NORVASC) 10 MG tablet Take 10 mg by mouth daily.    Historical Provider, MD  aspirin 81 MG  tablet Take 81 mg by mouth daily.    Historical Provider, MD  atorvastatin (LIPITOR) 80 MG tablet Take 80 mg by mouth daily.    Historical Provider, MD  calcium carbonate (OSCAL) 1500 (600 Ca) MG TABS tablet Take 600 mg of elemental calcium by mouth daily with breakfast.    Historical Provider, MD  cefUROXime (CEFTIN) 250 MG tablet Take 1 tablet (250 mg total) by mouth 2 (two) times daily with a meal. Patient not taking: Reported on 04/26/2016 04/13/16   Mohammed Kindle, MD  cefUROXime (CEFTIN) 500 MG tablet Limit 1 tab by mouth twice per day if tolerated 05/25/16   Mohammed Kindle, MD  Cholecalciferol (VITAMIN D3) 5000 UNITS CAPS Take 1 tablet by mouth daily.    Historical Provider, MD  diclofenac (FLECTOR) 1.3 % PTCH Apply one patch for a portion of patch to painful area of skin for strain, sprain, and spasms of the upper back and shoulder twice per day if tolerated 04/13/16   Mohammed Kindle, MD  DULoxetine (CYMBALTA) 60 MG capsule Take 60 mg by mouth daily.    Historical Provider, MD  Fluticasone-Salmeterol (ADVAIR DISKUS) 250-50 MCG/DOSE AEPB Inhale 1 puff into the lungs 2 (two) times daily. 01/19/16 01/18/17  Laverle Hobby, MD  FOLIC ACID PO Take 1 mg by mouth daily.     Historical Provider, MD  ipratropium (ATROVENT) 0.02 % nebulizer solution Take 2.5 mLs (0.5 mg total) by nebulization every 6 (six) hours as needed for wheezing or shortness of breath. 01/19/16   Laverle Hobby, MD  lubiprostone (AMITIZA) 24 MCG capsule Take 24 mcg by mouth daily as needed for constipation.    Historical Provider, MD  meclizine (ANTIVERT) 25 MG tablet Take 25 mg by mouth 3 (three) times daily as needed for dizziness.    Historical Provider, MD  methocarbamol (ROBAXIN) 750 MG tablet Take 750 mg by mouth daily as needed for muscle spasms.    Historical Provider, MD  metoprolol succinate (TOPROL-XL) 100 MG 24 hr tablet Take 100 mg by mouth daily. Take with or immediately following a meal.    Historical Provider, MD   Multiple Vitamins-Minerals (ONE-A-DAY WOMENS 50 PLUS PO) Take 1 tablet by mouth daily.    Historical Provider, MD  nitroGLYCERIN (NITROLINGUAL) 0.4 MG/SPRAY spray Place 1 spray under the tongue every 5 (five) minutes x 3 doses as needed for chest pain.    Historical Provider, MD  Omega-3 Fatty Acids (FISH OIL) 1000 MG CPDR Take by mouth once.    Historical Provider, MD  oxyCODONE (ROXICODONE) 5 MG immediate release tablet Take 1 tablet (5 mg total) by mouth every 8 (eight) hours as needed. 06/27/16 06/27/17  Victorino Dike, FNP  pantoprazole (PROTONIX) 40 MG tablet Take 40 mg by mouth 2 (two) times daily.    Historical Provider, MD  Pramoxine-Dimethicone 1-6 % CREA Apply topically.    Historical Provider, MD    Allergies Nystatin; Sulfa antibiotics; and Niaspan [niacin]  Family Kimberly  Problem Relation Age of Onset  . Cancer Mother   . Cancer Father   . Diabetes Brother   . Heart disease Brother     Social Kimberly  Social Kimberly  Substance Use Topics  . Smoking status: Current Every Day Smoker    Packs/day: 0.50    Types: Cigarettes  . Smokeless tobacco: Never Used  . Alcohol use No    Review of Systems Constitutional: No recent illness. Cardiovascular: Denies chest pain or palpitations. Respiratory: Denies shortness of breath. Musculoskeletal: Pain in lower back. Skin: Negative for rash, wound, lesion. Neurological: Negative for focal weakness or numbness.  ____________________________________________   PHYSICAL EXAM:  VITAL SIGNS: ED Triage Vitals  Enc Vitals Group     BP 06/27/16 1809 117/68     Pulse Rate 06/27/16 1809 66     Resp 06/27/16 1809 18     Temp 06/27/16 1809 97.9 F (36.6 C)     Temp Source 06/27/16 1809 Oral     SpO2 06/27/16 1809 98 %     Weight 06/27/16 1810 227 lb 8.2 oz (103.2 kg)     Height 06/27/16 1810 5\' 6"  (1.676 m)     Head Circumference --      Peak Flow --      Pain Score 06/27/16 1810 10     Pain Loc --      Pain Edu? --       Excl. in [place]? --     Constitutional: Alert and oriented. Well appearing and in no acute distress. Eyes: Conjunctivae are normal. EOMI. Head: Atraumatic. Neck: No stridor.  Respiratory: Normal respiratory effort.   Musculoskeletal: Pain transverse lower back without focal/midline tenderness of the LS.  Neurologic:  Normal speech and language. No gross focal neurologic deficits are appreciated. Speech is normal. No gait instability. No saddle anesthesia. Skin:  Skin is warm, dry and intact. Atraumatic. Psychiatric: Mood and affect are normal. Speech and behavior are normal.  ____________________________________________   LABS (all labs ordered are listed, but only abnormal results are displayed)  Labs Reviewed  URINALYSIS COMPLETEWITH MICROSCOPIC ([place] ONLY) - Abnormal; Notable for the following:       Result Value   Color, Urine YELLOW (*)    APPearance CLEAR (*)    Protein, ur 100 (*)    Squamous Epithelial / LPF 0-5 (*)    All other components within normal limits   ____________________________________________  RADIOLOGY  Not indicated. ____________________________________________   PROCEDURES  Procedure(s) performed: None   ____________________________________________   INITIAL IMPRESSION / ASSESSMENT AND PLAN / ED COURSE  Clinical Course    Pertinent labs & imaging results that were available during my care of the patient were reviewed by me and considered in my medical decision making (see chart for details).  Patient will take oxycodone 5mg  as directed. She was encouraged to follow up with her PCP for symptoms that are not improving over the next few days. She is to return to the ER for symptoms that change or worsen if unable to schedule an appointment. ____________________________________________   FINAL CLINICAL IMPRESSION(S) / ED DIAGNOSES  Final diagnoses:  Sciatica of right side       Victorino Dike, FNP 06/27/16 1905    Nance Pear,  MD 06/27/16 2007

## 2016-07-05 ENCOUNTER — Encounter: Payer: Self-pay | Admitting: Emergency Medicine

## 2016-07-05 ENCOUNTER — Inpatient Hospital Stay
Admission: EM | Admit: 2016-07-05 | Discharge: 2016-07-09 | DRG: 202 | Disposition: A | Discharge status: Home / Self Care | Payer: Medicare HMO | Attending: Internal Medicine | Admitting: Internal Medicine

## 2016-07-05 ENCOUNTER — Emergency Department: Payer: Medicare HMO

## 2016-07-05 ENCOUNTER — Inpatient Hospital Stay: Payer: Medicare HMO

## 2016-07-05 DIAGNOSIS — M6281 Muscle weakness (generalized): Secondary | ICD-10-CM

## 2016-07-05 DIAGNOSIS — J440 Chronic obstructive pulmonary disease with acute lower respiratory infection: Secondary | ICD-10-CM | POA: Diagnosis present

## 2016-07-05 DIAGNOSIS — Z992 Dependence on renal dialysis: Secondary | ICD-10-CM

## 2016-07-05 DIAGNOSIS — G253 Myoclonus: Secondary | ICD-10-CM | POA: Diagnosis present

## 2016-07-05 DIAGNOSIS — Z955 Presence of coronary angioplasty implant and graft: Secondary | ICD-10-CM | POA: Diagnosis not present

## 2016-07-05 DIAGNOSIS — R197 Diarrhea, unspecified: Secondary | ICD-10-CM | POA: Diagnosis present

## 2016-07-05 DIAGNOSIS — F17210 Nicotine dependence, cigarettes, uncomplicated: Secondary | ICD-10-CM | POA: Diagnosis present

## 2016-07-05 DIAGNOSIS — Z833 Family history of diabetes mellitus: Secondary | ICD-10-CM

## 2016-07-05 DIAGNOSIS — I120 Hypertensive chronic kidney disease with stage 5 chronic kidney disease or end stage renal disease: Secondary | ICD-10-CM | POA: Diagnosis present

## 2016-07-05 DIAGNOSIS — E785 Hyperlipidemia, unspecified: Secondary | ICD-10-CM | POA: Diagnosis present

## 2016-07-05 DIAGNOSIS — E1122 Type 2 diabetes mellitus with diabetic chronic kidney disease: Secondary | ICD-10-CM | POA: Diagnosis present

## 2016-07-05 DIAGNOSIS — Z8249 Family history of ischemic heart disease and other diseases of the circulatory system: Secondary | ICD-10-CM | POA: Diagnosis not present

## 2016-07-05 DIAGNOSIS — D61818 Other pancytopenia: Secondary | ICD-10-CM | POA: Diagnosis present

## 2016-07-05 DIAGNOSIS — Z96653 Presence of artificial knee joint, bilateral: Secondary | ICD-10-CM | POA: Diagnosis present

## 2016-07-05 DIAGNOSIS — R509 Fever, unspecified: Secondary | ICD-10-CM | POA: Diagnosis present

## 2016-07-05 DIAGNOSIS — Z6837 Body mass index (BMI) 37.0-37.9, adult: Secondary | ICD-10-CM | POA: Diagnosis not present

## 2016-07-05 DIAGNOSIS — D631 Anemia in chronic kidney disease: Secondary | ICD-10-CM | POA: Diagnosis present

## 2016-07-05 DIAGNOSIS — Z951 Presence of aortocoronary bypass graft: Secondary | ICD-10-CM | POA: Diagnosis not present

## 2016-07-05 DIAGNOSIS — N186 End stage renal disease: Secondary | ICD-10-CM | POA: Diagnosis present

## 2016-07-05 DIAGNOSIS — I208 Other forms of angina pectoris: Secondary | ICD-10-CM

## 2016-07-05 DIAGNOSIS — E669 Obesity, unspecified: Secondary | ICD-10-CM | POA: Diagnosis present

## 2016-07-05 DIAGNOSIS — E7800 Pure hypercholesterolemia, unspecified: Secondary | ICD-10-CM | POA: Diagnosis present

## 2016-07-05 DIAGNOSIS — I2510 Atherosclerotic heart disease of native coronary artery without angina pectoris: Secondary | ICD-10-CM | POA: Diagnosis present

## 2016-07-05 DIAGNOSIS — N3000 Acute cystitis without hematuria: Secondary | ICD-10-CM | POA: Diagnosis present

## 2016-07-05 DIAGNOSIS — A419 Sepsis, unspecified organism: Secondary | ICD-10-CM | POA: Diagnosis present

## 2016-07-05 DIAGNOSIS — J209 Acute bronchitis, unspecified: Principal | ICD-10-CM | POA: Diagnosis present

## 2016-07-05 DIAGNOSIS — Z8673 Personal history of transient ischemic attack (TIA), and cerebral infarction without residual deficits: Secondary | ICD-10-CM | POA: Diagnosis not present

## 2016-07-05 DIAGNOSIS — J189 Pneumonia, unspecified organism: Secondary | ICD-10-CM | POA: Diagnosis present

## 2016-07-05 DIAGNOSIS — E1151 Type 2 diabetes mellitus with diabetic peripheral angiopathy without gangrene: Secondary | ICD-10-CM | POA: Diagnosis present

## 2016-07-05 DIAGNOSIS — R42 Dizziness and giddiness: Secondary | ICD-10-CM

## 2016-07-05 DIAGNOSIS — I2089 Other forms of angina pectoris: Secondary | ICD-10-CM

## 2016-07-05 DIAGNOSIS — M25551 Pain in right hip: Secondary | ICD-10-CM

## 2016-07-05 LAB — CBC WITH DIFFERENTIAL/PLATELET
BASOS ABS: 0 10*3/uL (ref 0–0.1)
Basophils Relative: 0 %
EOS ABS: 0.2 10*3/uL (ref 0–0.7)
EOS PCT: 4 %
HCT: 34.4 % — ABNORMAL LOW (ref 35.0–47.0)
HEMOGLOBIN: 11.2 g/dL — AB (ref 12.0–16.0)
LYMPHS ABS: 0.5 10*3/uL — AB (ref 1.0–3.6)
Lymphocytes Relative: 12 %
MCH: 29.4 pg (ref 26.0–34.0)
MCHC: 32.5 g/dL (ref 32.0–36.0)
MCV: 90.6 fL (ref 80.0–100.0)
MONO ABS: 0.5 10*3/uL (ref 0.2–0.9)
Monocytes Relative: 10 %
NEUTROS PCT: 74 %
Neutro Abs: 3.3 10*3/uL (ref 1.4–6.5)
Platelets: 134 10*3/uL — ABNORMAL LOW (ref 150–440)
RBC: 3.8 MIL/uL (ref 3.80–5.20)
RDW: 18 % — ABNORMAL HIGH (ref 11.5–14.5)
WBC: 4.5 10*3/uL (ref 3.6–11.0)

## 2016-07-05 LAB — URINALYSIS COMPLETE WITH MICROSCOPIC (ARMC ONLY)
Bilirubin Urine: NEGATIVE
Glucose, UA: NEGATIVE mg/dL
HGB URINE DIPSTICK: NEGATIVE
KETONES UR: NEGATIVE mg/dL
LEUKOCYTES UA: NEGATIVE
NITRITE: NEGATIVE
PH: 9 — AB (ref 5.0–8.0)
SPECIFIC GRAVITY, URINE: 1.012 (ref 1.005–1.030)

## 2016-07-05 LAB — COMPREHENSIVE METABOLIC PANEL
ALBUMIN: 3.3 g/dL — AB (ref 3.5–5.0)
ALT: 13 U/L — ABNORMAL LOW (ref 14–54)
ANION GAP: 12 (ref 5–15)
AST: 31 U/L (ref 15–41)
Alkaline Phosphatase: 66 U/L (ref 38–126)
BUN: 20 mg/dL (ref 6–20)
CALCIUM: 8 mg/dL — AB (ref 8.9–10.3)
CO2: 28 mmol/L (ref 22–32)
Chloride: 95 mmol/L — ABNORMAL LOW (ref 101–111)
Creatinine, Ser: 3.88 mg/dL — ABNORMAL HIGH (ref 0.44–1.00)
GFR calc non Af Amer: 11 mL/min — ABNORMAL LOW (ref >60)
GFR, EST AFRICAN AMERICAN: 13 mL/min — AB (ref >60)
GLUCOSE: 104 mg/dL — AB (ref 65–99)
POTASSIUM: 3.4 mmol/L — AB (ref 3.5–5.1)
SODIUM: 135 mmol/L (ref 135–145)
Total Bilirubin: 0.8 mg/dL (ref 0.3–1.2)
Total Protein: 7.7 g/dL (ref 6.5–8.1)

## 2016-07-05 LAB — GLUCOSE, CAPILLARY: Glucose-Capillary: 94 mg/dL (ref 65–99)

## 2016-07-05 LAB — LACTIC ACID, PLASMA
LACTIC ACID, VENOUS: 0.9 mmol/L (ref 0.5–1.9)
Lactic Acid, Venous: 2.6 mmol/L (ref 0.5–1.9)

## 2016-07-05 LAB — MAGNESIUM: MAGNESIUM: 1.4 mg/dL — AB (ref 1.7–2.4)

## 2016-07-05 LAB — PHOSPHORUS: PHOSPHORUS: 1.8 mg/dL — AB (ref 2.5–4.6)

## 2016-07-05 LAB — TROPONIN I: TROPONIN I: 0.03 ng/mL — AB (ref <0.03)

## 2016-07-05 MED ORDER — METHOCARBAMOL 500 MG PO TABS
750.0000 mg | ORAL_TABLET | Freq: Every day | ORAL | Status: DC | PRN
  Filled 2016-07-05: qty 1.5

## 2016-07-05 MED ORDER — ACETAMINOPHEN 500 MG PO TABS
1000.0000 mg | ORAL_TABLET | Freq: Once | ORAL | Status: AC
  Administered 2016-07-05: 1000 mg via ORAL
  Filled 2016-07-05: qty 2

## 2016-07-05 MED ORDER — MOMETASONE FURO-FORMOTEROL FUM 200-5 MCG/ACT IN AERO
2.0000 | INHALATION_SPRAY | Freq: Two times a day (BID) | RESPIRATORY_TRACT | Status: DC
  Administered 2016-07-06 – 2016-07-07 (×4): 2 via RESPIRATORY_TRACT
  Filled 2016-07-05: qty 8.8

## 2016-07-05 MED ORDER — AMLODIPINE BESYLATE 10 MG PO TABS
10.0000 mg | ORAL_TABLET | Freq: Every day | ORAL | Status: DC
  Administered 2016-07-06 – 2016-07-08 (×2): 10 mg via ORAL
  Filled 2016-07-05 (×2): qty 1

## 2016-07-05 MED ORDER — PIPERACILLIN-TAZOBACTAM 3.375 G IVPB 30 MIN
3.3750 g | Freq: Once | INTRAVENOUS | Status: AC
  Administered 2016-07-05: 3.375 g via INTRAVENOUS
  Filled 2016-07-05: qty 50

## 2016-07-05 MED ORDER — METOPROLOL SUCCINATE ER 100 MG PO TB24
100.0000 mg | ORAL_TABLET | Freq: Every day | ORAL | Status: DC
  Administered 2016-07-06 – 2016-07-08 (×2): 100 mg via ORAL
  Filled 2016-07-05 (×2): qty 1

## 2016-07-05 MED ORDER — BENZONATATE 100 MG PO CAPS
100.0000 mg | ORAL_CAPSULE | Freq: Three times a day (TID) | ORAL | Status: DC | PRN
  Administered 2016-07-06: 100 mg via ORAL
  Filled 2016-07-05: qty 1

## 2016-07-05 MED ORDER — SODIUM CHLORIDE 0.9 % IV SOLN
INTRAVENOUS | Status: DC
  Administered 2016-07-06: via INTRAVENOUS

## 2016-07-05 MED ORDER — ACETAMINOPHEN 650 MG RE SUPP: 650.0000 mg | Freq: Four times a day (QID) | RECTAL | Status: DC | PRN

## 2016-07-05 MED ORDER — MAGNESIUM CITRATE PO SOLN
1.0000 | Freq: Once | ORAL | Status: DC | PRN
  Filled 2016-07-05: qty 296

## 2016-07-05 MED ORDER — SODIUM CHLORIDE 0.9% FLUSH
3.0000 mL | Freq: Two times a day (BID) | INTRAVENOUS | Status: DC
  Administered 2016-07-05 – 2016-07-08 (×5): 3 mL via INTRAVENOUS

## 2016-07-05 MED ORDER — ASPIRIN 81 MG PO CHEW
81.0000 mg | CHEWABLE_TABLET | Freq: Every day | ORAL | Status: DC
  Administered 2016-07-06 – 2016-07-08 (×3): 81 mg via ORAL
  Filled 2016-07-05 (×3): qty 1

## 2016-07-05 MED ORDER — DICLOFENAC EPOLAMINE 1.3 % TD PTCH
1.0000 | MEDICATED_PATCH | Freq: Two times a day (BID) | TRANSDERMAL | Status: DC
  Administered 2016-07-06 – 2016-07-08 (×3): 1 via TRANSDERMAL
  Filled 2016-07-05 (×9): qty 1

## 2016-07-05 MED ORDER — INSULIN ASPART 100 UNIT/ML ~~LOC~~ SOLN: 0.0000 [IU] | Freq: Every day | SUBCUTANEOUS | Status: DC

## 2016-07-05 MED ORDER — SENNOSIDES-DOCUSATE SODIUM 8.6-50 MG PO TABS: 1.0000 | ORAL_TABLET | Freq: Every evening | ORAL | Status: DC | PRN

## 2016-07-05 MED ORDER — DM-GUAIFENESIN ER 30-600 MG PO TB12: 1.0000 | ORAL_TABLET | Freq: Two times a day (BID) | ORAL | Status: DC

## 2016-07-05 MED ORDER — GUAIFENESIN ER 600 MG PO TB12
600.0000 mg | ORAL_TABLET | Freq: Two times a day (BID) | ORAL | Status: DC
  Administered 2016-07-06 – 2016-07-08 (×6): 600 mg via ORAL
  Filled 2016-07-05 (×6): qty 1

## 2016-07-05 MED ORDER — ZOLPIDEM TARTRATE 5 MG PO TABS: 5.0000 mg | ORAL_TABLET | Freq: Every evening | ORAL | Status: DC | PRN

## 2016-07-05 MED ORDER — ACETAMINOPHEN 325 MG PO TABS
650.0000 mg | ORAL_TABLET | Freq: Four times a day (QID) | ORAL | Status: DC | PRN
  Administered 2016-07-06 – 2016-07-08 (×4): 650 mg via ORAL
  Filled 2016-07-05 (×3): qty 2

## 2016-07-05 MED ORDER — NITROGLYCERIN 0.4 MG/SPRAY TL SOLN: 1.0000 | Status: DC | PRN

## 2016-07-05 MED ORDER — INSULIN ASPART 100 UNIT/ML ~~LOC~~ SOLN: 0.0000 [IU] | Freq: Three times a day (TID) | SUBCUTANEOUS | Status: DC

## 2016-07-05 MED ORDER — MECLIZINE HCL 25 MG PO TABS: 25.0000 mg | ORAL_TABLET | Freq: Three times a day (TID) | ORAL | Status: DC | PRN

## 2016-07-05 MED ORDER — SODIUM CHLORIDE 0.9 % IV BOLUS (SEPSIS)
500.0000 mL | Freq: Once | INTRAVENOUS | Status: AC
  Administered 2016-07-05: 500 mL via INTRAVENOUS

## 2016-07-05 MED ORDER — ATORVASTATIN CALCIUM 20 MG PO TABS
80.0000 mg | ORAL_TABLET | Freq: Every day | ORAL | Status: DC
  Administered 2016-07-06 – 2016-07-08 (×3): 80 mg via ORAL
  Filled 2016-07-05 (×4): qty 4

## 2016-07-05 MED ORDER — ONDANSETRON HCL 4 MG PO TABS: 4.0000 mg | ORAL_TABLET | Freq: Four times a day (QID) | ORAL | Status: DC | PRN

## 2016-07-05 MED ORDER — IPRATROPIUM-ALBUTEROL 0.5-2.5 (3) MG/3ML IN SOLN: 3.0000 mL | Freq: Four times a day (QID) | RESPIRATORY_TRACT | Status: DC | PRN

## 2016-07-05 MED ORDER — NITROGLYCERIN 0.4 MG SL SUBL: 0.4000 mg | SUBLINGUAL_TABLET | SUBLINGUAL | Status: DC | PRN

## 2016-07-05 MED ORDER — FOLIC ACID 1 MG PO TABS
1.0000 mg | ORAL_TABLET | Freq: Every day | ORAL | Status: DC
  Administered 2016-07-06 – 2016-07-08 (×3): 1 mg via ORAL
  Filled 2016-07-05 (×3): qty 1

## 2016-07-05 MED ORDER — BISACODYL 5 MG PO TBEC: 5.0000 mg | DELAYED_RELEASE_TABLET | Freq: Every day | ORAL | Status: DC | PRN

## 2016-07-05 MED ORDER — DEXTROMETHORPHAN POLISTIREX ER 30 MG/5ML PO SUER
30.0000 mg | Freq: Two times a day (BID) | ORAL | Status: DC
  Administered 2016-07-06 – 2016-07-08 (×6): 30 mg via ORAL
  Filled 2016-07-05 (×9): qty 5

## 2016-07-05 MED ORDER — ONDANSETRON HCL 4 MG/2ML IJ SOLN
4.0000 mg | Freq: Four times a day (QID) | INTRAMUSCULAR | Status: DC | PRN
  Administered 2016-07-08: 4 mg via INTRAVENOUS
  Filled 2016-07-05: qty 2

## 2016-07-05 MED ORDER — HYDROCODONE-ACETAMINOPHEN 5-325 MG PO TABS: 1.0000 | ORAL_TABLET | ORAL | Status: DC | PRN

## 2016-07-05 MED ORDER — VITAMIN D 1000 UNITS PO TABS
5000.0000 [IU] | ORAL_TABLET | Freq: Every day | ORAL | Status: DC
  Administered 2016-07-06 – 2016-07-08 (×3): 5000 [IU] via ORAL
  Filled 2016-07-05 (×3): qty 5

## 2016-07-05 MED ORDER — HEPARIN SODIUM (PORCINE) 5000 UNIT/ML IJ SOLN
5000.0000 [IU] | Freq: Three times a day (TID) | INTRAMUSCULAR | Status: DC
  Administered 2016-07-06 – 2016-07-09 (×10): 5000 [IU] via SUBCUTANEOUS
  Filled 2016-07-05 (×11): qty 1

## 2016-07-05 MED ORDER — PANTOPRAZOLE SODIUM 40 MG PO TBEC
40.0000 mg | DELAYED_RELEASE_TABLET | Freq: Two times a day (BID) | ORAL | Status: DC
  Administered 2016-07-06 – 2016-07-08 (×6): 40 mg via ORAL
  Filled 2016-07-05 (×6): qty 1

## 2016-07-05 MED ORDER — OMEGA-3-ACID ETHYL ESTERS 1 G PO CAPS
1.0000 g | ORAL_CAPSULE | Freq: Two times a day (BID) | ORAL | Status: DC
  Administered 2016-07-06 – 2016-07-08 (×6): 1 g via ORAL
  Filled 2016-07-05 (×6): qty 1

## 2016-07-05 MED ORDER — CALCIUM CARBONATE ANTACID 500 MG PO CHEW
3.0000 | CHEWABLE_TABLET | Freq: Every day | ORAL | Status: DC
  Administered 2016-07-06 – 2016-07-09 (×4): 600 mg via ORAL
  Filled 2016-07-05 (×4): qty 3

## 2016-07-05 MED ORDER — CALCIUM CARBONATE 1500 (600 CA) MG PO TABS: 600.0000 mg | ORAL_TABLET | Freq: Every day | ORAL | Status: DC

## 2016-07-05 MED ORDER — VANCOMYCIN HCL IN DEXTROSE 1-5 GM/200ML-% IV SOLN
1000.0000 mg | Freq: Once | INTRAVENOUS | Status: AC
  Administered 2016-07-05: 1000 mg via INTRAVENOUS
  Filled 2016-07-05: qty 200

## 2016-07-05 MED ORDER — LUBIPROSTONE 24 MCG PO CAPS
24.0000 ug | ORAL_CAPSULE | Freq: Every day | ORAL | Status: DC | PRN
  Filled 2016-07-05: qty 1

## 2016-07-05 MED ORDER — DULOXETINE HCL 60 MG PO CPEP
60.0000 mg | ORAL_CAPSULE | Freq: Every day | ORAL | Status: DC
  Administered 2016-07-06 – 2016-07-08 (×3): 60 mg via ORAL
  Filled 2016-07-05 (×3): qty 1

## 2016-07-05 MED ORDER — OXYCODONE HCL 5 MG PO TABS
5.0000 mg | ORAL_TABLET | Freq: Three times a day (TID) | ORAL | Status: DC | PRN
  Administered 2016-07-07: 5 mg via ORAL
  Filled 2016-07-05: qty 1

## 2016-07-05 NOTE — ED Triage Notes (Signed)
Pt to ed with c/o fever, chills and sob after finishing dialysis today.

## 2016-07-05 NOTE — ED Notes (Signed)
Patient transported to X-ray 

## 2016-07-05 NOTE — ED Provider Notes (Signed)
[facility] Emergency Department Provider Note  Time seen: 7:24 PM  I have reviewed the triage vital signs and the nursing notes.   HISTORY  Chief Complaint Fever and Shortness of Breath    HPI Kimberly Shaffer is a 65 y.o. female with a past medical history of hypertension, MI, end-stage renal disease on hemodialysis Tuesday, Thursday, Saturday, COPD, diabetes, presents the emergency department with fever and nausea and generalized fatigue. According to the patient for the past 2-3 days she has felt very warm like she has had a fever. Patient also states for the past few days she has been short of breath with coughing. States yellow sputum production. After dialysis today the patient was feeling very weak and noted have an elevated temperature so they sent her to the emergency department. Here the patient has a temperature of 103, with an elevated heart rate, pulse ox in the 80s, patient does not require oxygen normally. Patient denies any abdominal pain. Denies dysuria. Urinates 3-4 times daily.  Past Medical History:  Diagnosis Date  . Asthma   . CAD (coronary artery disease)   . COPD (chronic obstructive pulmonary disease) ([place])   . Depression   . Diabetes mellitus without complication (HCC)    NIDD  . H/O angina pectoris   . H/O blood clots   . Hypercholesteremia   . Hypertension   . Myocardial infarction   . Obesity   . Renal insufficiency   . Stroke ([place])   . Vertigo, aural     Patient Active Problem List   Diagnosis Date Noted  . Stable angina ([place]) 07/05/2016  . Fracture of left foot 04/26/2016  . Chest pain at rest 12/17/2015  . Chest pain 12/17/2015  . DJD of shoulder 10/21/2015  . 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 Lumbar radiculopathy 05/14/2015  . 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 Lumbosacral facet joint syndrome 02/16/2015  . Greater trochanteric bursitis 02/16/2015  . DDD (degenerative disc disease), lumbosacral  01/20/2015  . 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 DJD (degenerative joint disease) shoulder 01/20/2015    Past Surgical History:  Procedure Laterality Date  . AV FISTULA PLACEMENT Left 11-04-2015  . AV FISTULA PLACEMENT Left 11/04/2015   Procedure: ARTERIOVENOUS (AV) FISTULA CREATION  ( BRACHIAL CEPHALIC );  Surgeon: Katha Cabal, MD;  Location: [facility] ORS;  Service: Vascular;  Laterality: Left;  . CARDIAC CATHETERIZATION    . CORONARY ARTERY BYPASS GRAFT  2012  . CORONARY STENT PLACEMENT    . JOINT REPLACEMENT    . KNEE SURGERY Bilateral   . PERIPHERAL VASCULAR CATHETERIZATION N/A 11/13/2015   Procedure: Dialysis/Perma Catheter Insertion;  Surgeon: Katha Cabal, MD;  Location: [place] [facility];  Service: Cardiovascular;  Laterality: N/A;  . PERIPHERAL VASCULAR CATHETERIZATION N/A 12/04/2015   Procedure: Dialysis/Perma Catheter Insertion;  Surgeon: Katha Cabal, MD;  Location: [place] [facility];  Service: Cardiovascular;  Laterality: N/A;  . PERIPHERAL VASCULAR CATHETERIZATION Left 12/31/2015   Procedure: A/V Shuntogram/Fistulagram;  Surgeon: Algernon Huxley, MD;  Location: [place] [facility];  Service: Cardiovascular;  Laterality: Left;  . PERIPHERAL VASCULAR CATHETERIZATION N/A 12/31/2015   Procedure: A/V Shunt Intervention;  Surgeon: Algernon Huxley, MD;  Location: [place] [facility];  Service: Cardiovascular;  Laterality: N/A;  . PERIPHERAL VASCULAR CATHETERIZATION Left 02/25/2016   Procedure: A/V Shuntogram/Fistulagram;  Surgeon: Algernon Huxley, MD;  Location: [place] [facility];  Service: Cardiovascular;  Laterality: Left;  . PERIPHERAL VASCULAR CATHETERIZATION N/A 03/18/2016   Procedure: Dialysis/Perma Catheter Removal;  Surgeon: Katha Cabal, MD;  Location: [place] [facility];  Service: Cardiovascular;  Laterality: N/A;  . REPLACEMENT TOTAL KNEE BILATERAL Bilateral     Prior to Admission medications   Medication Sig Start  Date End Date Taking? Authorizing Provider  albuterol (PROVENTIL HFA;VENTOLIN HFA) 108 (90 BASE) MCG/ACT inhaler Inhale into the lungs every 4 (four) hours as needed for wheezing or shortness of breath. 2-4 puffs   Yes Historical Provider, MD  amLODipine (NORVASC) 10 MG tablet Take 10 mg by mouth daily.   Yes Historical Provider, MD  aspirin 81 MG tablet Take 81 mg by mouth daily.   Yes Historical Provider, MD  atorvastatin (LIPITOR) 80 MG tablet Take 80 mg by mouth daily.   Yes Historical Provider, MD  calcium carbonate (OSCAL) 1500 (600 Ca) MG TABS tablet Take 600 mg of elemental calcium by mouth daily with breakfast.   Yes Historical Provider, MD  cefUROXime (CEFTIN) 500 MG tablet Limit 1 tab by mouth twice per day if tolerated 05/25/16  Yes Mohammed Kindle, MD  Cholecalciferol (VITAMIN D3) 5000 UNITS CAPS Take 1 tablet by mouth daily.   Yes Historical Provider, MD  diclofenac (FLECTOR) 1.3 % PTCH Apply one patch for a portion of patch to painful area of skin for strain, sprain, and spasms of the upper back and shoulder twice per day if tolerated 04/13/16  Yes Mohammed Kindle, MD  DULoxetine (CYMBALTA) 60 MG capsule Take 60 mg by mouth daily.   Yes Historical Provider, MD  Fluticasone-Salmeterol (ADVAIR DISKUS) 250-50 MCG/DOSE AEPB Inhale 1 puff into the lungs 2 (two) times daily. 01/19/16 01/18/17 Yes Laverle Hobby, MD  FOLIC ACID PO Take 1 mg by mouth daily.    Yes Historical Provider, MD  ipratropium (ATROVENT) 0.02 % nebulizer solution Take 2.5 mLs (0.5 mg total) by nebulization every 6 (six) hours as needed for wheezing or shortness of breath. 01/19/16  Yes Laverle Hobby, MD  lubiprostone (AMITIZA) 24 MCG capsule Take 24 mcg by mouth daily as needed for constipation.   Yes Historical Provider, MD  meclizine (ANTIVERT) 25 MG tablet Take 25 mg by mouth 3 (three) times daily as needed for dizziness.   Yes Historical Provider, MD  methocarbamol (ROBAXIN) 750 MG tablet Take 750 mg by mouth daily  as needed for muscle spasms.   Yes Historical Provider, MD  metoprolol succinate (TOPROL-XL) 100 MG 24 hr tablet Take 100 mg by mouth daily. Take with or immediately following a meal.   Yes Historical Provider, MD  Multiple Vitamins-Minerals (ONE-A-DAY WOMENS 50 PLUS PO) Take 1 tablet by mouth daily.   Yes Historical Provider, MD  nitroGLYCERIN (NITROLINGUAL) 0.4 MG/SPRAY spray Place 1 spray under the tongue every 5 (five) minutes x 3 doses as needed for chest pain.   Yes Historical Provider, MD  Omega-3 Fatty Acids (FISH OIL) 1000 MG CPDR Take by mouth daily.    Yes Historical Provider, MD  oxyCODONE (ROXICODONE) 5 MG immediate release tablet Take 1 tablet (5 mg total) by mouth every 8 (eight) hours as needed. 06/27/16 06/27/17 Yes Cari B Triplett, FNP  pantoprazole (PROTONIX) 40 MG tablet Take 40 mg by mouth 2 (two) times daily.   Yes Historical Provider, MD  Pramoxine-Dimethicone 1-6 % CREA Apply topically.    Historical Provider, MD    Allergies  Allergen Reactions  . Nystatin Hives and Itching  . Sulfa Antibiotics Swelling, Hives and Rash  .  Niaspan [Niacin] Other (See Comments) and Hives    Family History  Problem Relation Age of Onset  . Cancer Mother   . Cancer Father   . Diabetes Brother   . Heart disease Brother     Social History Social History  Substance Use Topics  . Smoking status: Current Every Day Smoker    Packs/day: 0.50    Types: Cigarettes  . Smokeless tobacco: Never Used  . Alcohol use No    Review of Systems Constitutional: Positive for fever ENT: Negative for congestion Cardiovascular: Negative for chest pain. Respiratory: Positive for shortness of breath. Positive for cough with yellow sputum production. Gastrointestinal: Negative for abdominal pain. Positive for nausea. Genitourinary: Negative for dysuria. Musculoskeletal: States some pain in the right lower back. Skin: Negative for rash. Neurological: Negative for headache 10-point ROS otherwise  negative.  ____________________________________________   PHYSICAL EXAM:  VITAL SIGNS: ED Triage Vitals  Enc Vitals Group     BP 07/05/16 1823 128/61     Pulse Rate 07/05/16 1823 (!) 118     Resp 07/05/16 1823 (!) 26     Temp 07/05/16 1823 (!) 103 F (39.4 C)     Temp Source 07/05/16 1823 Oral     SpO2 07/05/16 1823 91 %     Weight 07/05/16 1824 228 lb (103.4 kg)     Height --      Head Circumference --      Peak Flow --      Pain Score 07/05/16 1825 8     Pain Loc --      Pain Edu? --      Excl. in [place]? --     Constitutional: Alert and oriented. Well appearing and in no distress. Eyes: Normal exam ENT   Head: Normocephalic and atraumatic.   Mouth/Throat: Mucous membranes are moist. Cardiovascular: Normal rate, regular rhythm. No murmur Respiratory: Normal respiratory effort without tachypnea nor retractions. Breath sounds are clear  Gastrointestinal: Soft and nontender. No distention.  Musculoskeletal: Nontender with normal range of motion in all extremities. Good range of motion in both lower extremities without issue. Left upper extremity fistula. Neurologic:  Normal speech and language. No gross focal neurologic deficits Skin:  Skin is warm, dry and intact.  Psychiatric: Mood and affect are normal.   ____________________________________________    EKG  EKG reviewed and interpreted by myself shows sinus tachycardia 109 bpm, narrow QRS, normal axis largely normal intervals, patient does have T-wave inversions in the lateral leads, nonspecific ST changes.  ____________________________________________    RADIOLOGY  Chest x-ray shows bibasilar densities concerning for edema.  ____________________________________________   INITIAL IMPRESSION / ASSESSMENT AND PLAN / ED COURSE  Pertinent labs & imaging results that were available during my care of the patient were reviewed by me and considered in my medical decision making (see chart for details).  The  patient presents to the emergency department with fever, shortness of breath, cough with yellow sputum production. Patient is febrile to 103 and tachycardic to 118 in the emergency department. Room air oxygen saturation is in the 80s, sats in the mid 90s on 2 L. Given the patient's fever, elevated heart rate with generalized weakness have activated sepsis protocols. We will initiate empiric antibiotics. As the patient is currently on dialysis we will limit IV fluids.  Patient's workup shows a chest x-ray with bibasilar densities worsening edema. Patient's urinalysis appears to be consistent with urinary tract infection with many bacteria. Cultures have been sent. We will continue  IV antibiotics in the emergency department and admit the patient to the hospital for further workup. Patient's EKG does show lateral T-wave inversions, on 12/17/15 patient appeared to have ST changes in these leads most consistent with MI this is likely expected changes. I have added a troponin onto the patient's blood work.   CRITICAL CARE Performed by: Harvest Dark   Total critical care time: 30 minutes  Critical care time was exclusive of separately billable procedures and treating other patients.  Critical care was necessary to treat or prevent imminent or life-threatening deterioration.  Critical care was time spent personally by me on the following activities: development of treatment plan with patient and/or surrogate as well as nursing, discussions with consultants, evaluation of patient's response to treatment, examination of patient, obtaining history from patient or surrogate, ordering and performing treatments and interventions, ordering and review of laboratory studies, ordering and review of radiographic studies, pulse oximetry and re-evaluation of patient's condition.   ____________________________________________   FINAL CLINICAL IMPRESSION(S) / ED DIAGNOSES  Fever Sepsis    Harvest Dark,  MD 07/05/16 2120

## 2016-07-06 LAB — C DIFFICILE QUICK SCREEN W PCR REFLEX
C DIFFICILE (CDIFF) INTERP: NOT DETECTED
C DIFFICILE (CDIFF) TOXIN: NEGATIVE
C DIFFICLE (CDIFF) ANTIGEN: NEGATIVE

## 2016-07-06 LAB — GLUCOSE, CAPILLARY
GLUCOSE-CAPILLARY: 89 mg/dL (ref 65–99)
GLUCOSE-CAPILLARY: 81 mg/dL (ref 65–99)
Glucose-Capillary: 77 mg/dL (ref 65–99)
Glucose-Capillary: 99 mg/dL (ref 65–99)

## 2016-07-06 LAB — CBC
HCT: 31.6 % — ABNORMAL LOW (ref 35.0–47.0)
HEMOGLOBIN: 10.4 g/dL — AB (ref 12.0–16.0)
MCH: 29.8 pg (ref 26.0–34.0)
MCHC: 33 g/dL (ref 32.0–36.0)
MCV: 90.2 fL (ref 80.0–100.0)
Platelets: 112 10*3/uL — ABNORMAL LOW (ref 150–440)
RBC: 3.5 MIL/uL — ABNORMAL LOW (ref 3.80–5.20)
RDW: 17.9 % — AB (ref 11.5–14.5)
WBC: 3.5 10*3/uL — ABNORMAL LOW (ref 3.6–11.0)

## 2016-07-06 LAB — BASIC METABOLIC PANEL
ANION GAP: 11 (ref 5–15)
BUN: 25 mg/dL — AB (ref 6–20)
CALCIUM: 7.7 mg/dL — AB (ref 8.9–10.3)
CO2: 25 mmol/L (ref 22–32)
CREATININE: 4.92 mg/dL — AB (ref 0.44–1.00)
Chloride: 97 mmol/L — ABNORMAL LOW (ref 101–111)
GFR calc Af Amer: 10 mL/min — ABNORMAL LOW (ref >60)
GFR, EST NON AFRICAN AMERICAN: 8 mL/min — AB (ref >60)
GLUCOSE: 95 mg/dL (ref 65–99)
Potassium: 3.8 mmol/L (ref 3.5–5.1)
Sodium: 133 mmol/L — ABNORMAL LOW (ref 135–145)

## 2016-07-06 LAB — INFLUENZA PANEL BY PCR (TYPE A & B)
H1N1FLUPCR: NOT DETECTED
INFLBPCR: NEGATIVE
Influenza A By PCR: NEGATIVE

## 2016-07-06 LAB — MRSA PCR SCREENING: MRSA by PCR: NEGATIVE

## 2016-07-06 MED ORDER — VANCOMYCIN HCL IN DEXTROSE 750-5 MG/150ML-% IV SOLN
750.0000 mg | INTRAVENOUS | Status: DC
  Administered 2016-07-07: 750 mg via INTRAVENOUS
  Filled 2016-07-06 (×2): qty 150

## 2016-07-06 MED ORDER — IBUPROFEN 400 MG PO TABS
400.0000 mg | ORAL_TABLET | ORAL | Status: DC | PRN
  Administered 2016-07-06: 400 mg via ORAL
  Filled 2016-07-06: qty 1

## 2016-07-06 MED ORDER — VANCOMYCIN HCL 10 G IV SOLR
1750.0000 mg | Freq: Once | INTRAVENOUS | Status: AC
  Administered 2016-07-06: 1750 mg via INTRAVENOUS
  Filled 2016-07-06: qty 1750

## 2016-07-06 MED ORDER — CALCIUM ACETATE (PHOS BINDER) 667 MG PO CAPS
1334.0000 mg | ORAL_CAPSULE | Freq: Three times a day (TID) | ORAL | Status: DC
  Administered 2016-07-06 – 2016-07-09 (×6): 1334 mg via ORAL
  Filled 2016-07-06 (×6): qty 2

## 2016-07-06 MED ORDER — VANCOMYCIN HCL IN DEXTROSE 750-5 MG/150ML-% IV SOLN: 750.0000 mg | INTRAVENOUS | Status: DC | PRN

## 2016-07-06 MED ORDER — PIPERACILLIN-TAZOBACTAM 3.375 G IVPB
3.3750 g | Freq: Two times a day (BID) | INTRAVENOUS | Status: DC
  Administered 2016-07-06 – 2016-07-08 (×5): 3.375 g via INTRAVENOUS
  Filled 2016-07-06 (×5): qty 50

## 2016-07-06 NOTE — Progress Notes (Signed)
[facility] [place] NAME: Kimberly Shaffer    MR#:  [id number]  DATE OF BIRTH:  26-Apr-1951  SUBJECTIVE:  CHIEF COMPLAINT:   Chief Complaint  Patient presents with  . Fever  . Shortness of Breath   The patient is a 65 year old African-American female with physical history significant history of end-stage renal disease, hypertension, COPD, diabetes mellitus, coronary artery disease, who presents to the hospital with complaints of fever, cough, sputum production. She has history was concerning for CHF versus pneumonia, patient was initiated on broad-spectrum antibiotic therapy, she feels better today, except a diarrheal stool Review of Systems  Constitutional: Negative for chills, fever and weight loss.  HENT: Negative for congestion.   Eyes: Negative for blurred vision and double vision.  Respiratory: Positive for cough, sputum production and shortness of breath. Negative for wheezing.   Cardiovascular: Negative for chest pain, palpitations, orthopnea, leg swelling and PND.  Gastrointestinal: Positive for diarrhea. Negative for abdominal pain, blood in stool, constipation, nausea and vomiting.  Genitourinary: Negative for dysuria, frequency, hematuria and urgency.  Musculoskeletal: Negative for falls.  Neurological: Negative for dizziness, tremors, focal weakness and headaches.  Endo/Heme/Allergies: Does not bruise/bleed easily.  Psychiatric/Behavioral: Negative for depression. The patient does not have insomnia.     VITAL SIGNS: Blood pressure 131/64, pulse 87, temperature (!) 100.6 F (38.1 C), temperature source Oral, resp. rate 20, height 5\' 6"  (1.676 m), weight 103.4 kg (228 lb), SpO2 97 %.  PHYSICAL EXAMINATION:   GENERAL:  65 y.o.-year-old patient lying in the bed with no acute distress.  EYES: Pupils equal, round, reactive to light and accommodation. No scleral icterus. Extraocular muscles intact.  HEENT: Head atraumatic,  normocephalic. Oropharynx and nasopharynx clear.  NECK:  Supple, no jugular venous distention. No thyroid enlargement, no tenderness.  LUNGS: Normal breath sounds bilaterally, no wheezing, right-sided rales,rhonchi and crepitations at the posteriorly. Intermittent use of accessory muscles of respiration, especially with movements or speech.  CARDIOVASCULAR: S1, S2 normal. No murmurs, rubs, or gallops.  ABDOMEN: Soft, nontender, nondistended. Bowel sounds present. No organomegaly or mass.  EXTREMITIES: No pedal edema, cyanosis, or clubbing.  NEUROLOGIC: Cranial nerves II through XII are intact. Muscle strength 5/5 in all extremities. Sensation intact. Gait not checked.  PSYCHIATRIC: The patient is alert and oriented x 3.  SKIN: No obvious rash, lesion, or ulcer.   ORDERS/RESULTS REVIEWED:   CBC  Recent Labs Lab 07/05/16 1842 07/06/16 0619  WBC 4.5 3.5*  HGB 11.2* 10.4*  HCT 34.4* 31.6*  PLT 134* 112*  MCV 90.6 90.2  MCH 29.4 29.8  MCHC 32.5 33.0  RDW 18.0* 17.9*  LYMPHSABS 0.5*  --   MONOABS 0.5  --   EOSABS 0.2  --   BASOSABS 0.0  --    ------------------------------------------------------------------------------------------------------------------  Chemistries   Recent Labs Lab 07/05/16 1842 07/06/16 0619  NA 135 133*  K 3.4* 3.8  CL 95* 97*  CO2 28 25  GLUCOSE 104* 95  BUN 20 25*  CREATININE 3.88* 4.92*  CALCIUM 8.0* 7.7*  MG 1.4*  --   AST 31  --   ALT 13*  --   ALKPHOS 66  --   BILITOT 0.8  --    ------------------------------------------------------------------------------------------------------------------ estimated creatinine clearance is 13.8 mL/min (by C-G formula based on SCr of 4.92 mg/dL (H)). ------------------------------------------------------------------------------------------------------------------ No results for input(s): TSH, T4TOTAL, T3FREE, THYROIDAB in the last 72 hours.  Invalid input(s): FREET3  Cardiac Enzymes  Recent  Labs Lab  07/05/16 1842  TROPONINI 0.03*   ------------------------------------------------------------------------------------------------------------------ Invalid input(s): POCBNP ---------------------------------------------------------------------------------------------------------------  RADIOLOGY: Dg Chest 2 View  Result Date: 07/05/2016 CLINICAL DATA:  Fever, chills, shortness of breath. EXAM: CHEST  2 VIEW COMPARISON:  Radiographs of December 19, 2015. FINDINGS: Stable cardiomediastinal silhouette. No pneumothorax or significant pleural effusion is noted. Multilevel degenerative disc disease is noted in the thoracic spine. Mildly increased basilar interstitial densities are noted concerning for worsening pulmonary edema. IMPRESSION: Mildly increased bibasilar interstitial densities concerning for worsening edema. Electronically Signed   By: Marijo Conception, M.D.   On: 07/05/2016 18:58   Dg Hip Unilat With Pelvis 2-3 Views Right  Result Date: 07/06/2016 CLINICAL DATA:  Subacute onset of right hip pain. Initial encounter. EXAM: DG HIP (WITH OR WITHOUT PELVIS) 2-3V RIGHT COMPARISON:  None. FINDINGS: There is no evidence of fracture or dislocation. Axial joint space narrowing is noted at the right hip, with mild sclerosis and subcortical cystic change. The proximal right femur appears intact. Mild degenerative change is noted at the sacroiliac joints, pubic symphysis and lower lumbar spine. The left hip joint is grossly unremarkable in appearance. The visualized bowel gas pattern is grossly unremarkable in appearance. Scattered phleboliths are noted within the pelvis. IMPRESSION: 1. Axial joint space narrowing at the right hip, with mild sclerosis and subcortical cystic change. 2. No evidence of fracture or dislocation. Electronically Signed   By: Garald Balding M.D.   On: 07/06/2016 01:16    EKG:  Orders placed or performed during the hospital encounter of 07/05/16  . EKG 12-Lead  . EKG  12-Lead    ASSESSMENT AND PLAN:  Active Problems:   Sepsis due to pneumonia ([place]) #1. Sepsis due to pneumonia, continue patient on broad-spectrum antibody therapy with vancomycin and Zosyn, get sputum cultures if possible, adjust antibiotics depending on culture results, influenza testing was negative #2  Bacterial pneumonia, get sputum cultures if possible, continue broad-spectrum antibiotic therapy #3. Diarrhea, possibly related due to antibiotics, rule out infectious. If continuous #4. End-stage renal disease, dialysis tomorrow per schedule, appreciate nephrology's input #5 Pancytopenia, recheck CBC in the morning, possibly infection related   Management plans discussed with the patient, family and they are in agreement.   DRUG ALLERGIES:  Allergies  Allergen Reactions  . Nystatin Hives and Itching  . Sulfa Antibiotics Swelling, Hives and Rash  . Niaspan [Niacin] Other (See Comments) and Hives    CODE STATUS:     Code Status Orders        Start     Ordered   07/05/16 2237  Full code  Continuous     07/05/16 2236    Code Status History    Date Active Date Inactive Code Status Order ID Comments User Context   12/17/2015  8:12 AM 12/20/2015  7:57 PM Full Code [id number]  Saundra Shelling, MD Inpatient      TOTAL TIME TAKING CARE OF THIS PATIENT: 40 minutes.    Theodoro Grist M.D on 07/06/2016 at 1:00 PM  Between 7am to 6pm - Pager - [phone]  After 6pm go to [email or web address] - password [email or web address] [facility]  Office  [phone]  CC: Primary care physician; Pcp Not In System

## 2016-07-06 NOTE — Progress Notes (Signed)
[facility]  ROUNDING NOTE   Subjective:  Patient well known to [place] from outpt dialysis. She had fevers during HD yesterday. She had been prescribed outpt abx for presumed bronchitis/pneumonia.  Blood cultures thus far negative.    Objective:  Vital signs in last 24 hours:  Temp:  [99.1 F (37.3 C)-103.2 F (39.6 C)] 103 F (39.4 C) (10/18 1042) Pulse Rate:  [80-118] 89 (10/18 0929) Resp:  [17-32] 19 (10/18 0542) BP: (86-151)/(49-78) 134/72 (10/18 0929) SpO2:  [91 %-100 %] 96 % (10/18 0542) Weight:  [103.4 kg (228 lb)] 103.4 kg (228 lb) (10/17 1932)  Weight change:  Filed Weights   07/05/16 1824 07/05/16 1932  Weight: 103.4 kg (228 lb) 103.4 kg (228 lb)    Intake/Output: I/O last 3 completed shifts: In: 7209 [P.O.:924; I.V.:3; IV Piggyback:750] Out: 151 [Urine:150; Stool:1]   Intake/Output this shift:  Total I/O In: 240 [P.O.:240] Out: -   Physical Exam: General: No acute distress  Head: Normocephalic, atraumatic. Moist oral mucosal membranes  Eyes: Anicteric  Neck: Supple, trachea midline  Lungs:  Basilar rales, normal effort  Heart: S1S2 no rubs  Abdomen:  Soft, nontender, bowel sounds present  Extremities: no peripheral edema.  Neurologic: Nonfocal, moving all four extremities  Skin: No lesions  Access: Left upper extremity access    Basic Metabolic Panel:  Recent Labs Lab 07/05/16 1842 07/06/16 0619  NA 135 133*  K 3.4* 3.8  CL 95* 97*  CO2 28 25  GLUCOSE 104* 95  BUN 20 25*  CREATININE 3.88* 4.92*  CALCIUM 8.0* 7.7*  MG 1.4*  --   PHOS 1.8*  --     Liver Function Tests:  Recent Labs Lab 07/05/16 1842  AST 31  ALT 13*  ALKPHOS 66  BILITOT 0.8  PROT 7.7  ALBUMIN 3.3*   No results for input(s): LIPASE, AMYLASE in the last 168 hours. No results for input(s): AMMONIA in the last 168 hours.  CBC:  Recent Labs Lab 07/05/16 1842 07/06/16 0619  WBC 4.5 3.5*  NEUTROABS 3.3  --   HGB 11.2* 10.4*  HCT 34.4* 31.6*   MCV 90.6 90.2  PLT 134* 112*    Cardiac Enzymes:  Recent Labs Lab 07/05/16 1842  TROPONINI 0.03*    BNP: Invalid input(s): POCBNP  CBG:  Recent Labs Lab 07/05/16 2336 07/06/16 0841  GLUCAP 94 89    Microbiology: Results for orders placed or performed during the hospital encounter of 07/05/16  Culture, blood (Routine x 2)     Status: None (Preliminary result)   Collection Time: 07/05/16  6:42 PM  Result Value Ref Range Status   Specimen Description BLOOD RIGHT WRIST  Final   Special Requests BAA5ML BAE5ML  Final   Culture NO GROWTH < 12 HOURS  Final   Report Status PENDING  Incomplete  Culture, blood (Routine x 2)     Status: None (Preliminary result)   Collection Time: 07/05/16  6:47 PM  Result Value Ref Range Status   Specimen Description BLOOD RIGHT AC  Final   Special Requests BAA 5ML BAE 5ML  Final   Culture NO GROWTH < 12 HOURS  Final   Report Status PENDING  Incomplete  MRSA PCR Screening     Status: None   Collection Time: 07/05/16 11:45 PM  Result Value Ref Range Status   MRSA by PCR NEGATIVE NEGATIVE Final    Comment:        The GeneXpert MRSA Assay (FDA approved for NASAL  specimens only), is one component of a comprehensive MRSA colonization surveillance program. It is not intended to diagnose MRSA infection nor to guide or monitor treatment for MRSA infections.     Coagulation Studies: No results for input(s): LABPROT, INR in the last 72 hours.  Urinalysis:  Recent Labs  07/05/16 2037  COLORURINE YELLOW*  LABSPEC 1.012  PHURINE 9.0*  GLUCOSEU NEGATIVE  HGBUR NEGATIVE  BILIRUBINUR NEGATIVE  KETONESUR NEGATIVE  PROTEINUR >500*  NITRITE NEGATIVE  LEUKOCYTESUR NEGATIVE      Imaging: Dg Chest 2 View  Result Date: 07/05/2016 CLINICAL DATA:  Fever, chills, shortness of breath. EXAM: CHEST  2 VIEW COMPARISON:  Radiographs of December 19, 2015. FINDINGS: Stable cardiomediastinal silhouette. No pneumothorax or significant pleural  effusion is noted. Multilevel degenerative disc disease is noted in the thoracic spine. Mildly increased basilar interstitial densities are noted concerning for worsening pulmonary edema. IMPRESSION: Mildly increased bibasilar interstitial densities concerning for worsening edema. Electronically Signed   By: Marijo Conception, M.D.   On: 07/05/2016 18:58   Dg Hip Unilat With Pelvis 2-3 Views Right  Result Date: 07/06/2016 CLINICAL DATA:  Subacute onset of right hip pain. Initial encounter. EXAM: DG HIP (WITH OR WITHOUT PELVIS) 2-3V RIGHT COMPARISON:  None. FINDINGS: There is no evidence of fracture or dislocation. Axial joint space narrowing is noted at the right hip, with mild sclerosis and subcortical cystic change. The proximal right femur appears intact. Mild degenerative change is noted at the sacroiliac joints, pubic symphysis and lower lumbar spine. The left hip joint is grossly unremarkable in appearance. The visualized bowel gas pattern is grossly unremarkable in appearance. Scattered phleboliths are noted within the pelvis. IMPRESSION: 1. Axial joint space narrowing at the right hip, with mild sclerosis and subcortical cystic change. 2. No evidence of fracture or dislocation. Electronically Signed   By: Garald Balding M.D.   On: 07/06/2016 01:16     Medications:     . amLODipine  10 mg Oral Daily  . aspirin  81 mg Oral Daily  . atorvastatin  80 mg Oral Daily  . calcium carbonate  3 tablet Oral Q breakfast  . cholecalciferol  5,000 Units Oral Daily  . guaiFENesin  600 mg Oral BID   And  . dextromethorphan  30 mg Oral BID  . diclofenac  1 patch Transdermal BID  . DULoxetine  60 mg Oral Daily  . folic acid  1 mg Oral Daily  . heparin  5,000 Units Subcutaneous Q8H  . insulin aspart  0-20 Units Subcutaneous TID WC  . insulin aspart  0-5 Units Subcutaneous QHS  . metoprolol succinate  100 mg Oral Daily  . mometasone-formoterol  2 puff Inhalation BID  . omega-3 acid ethyl esters  1 g  Oral BID  . pantoprazole  40 mg Oral BID  . piperacillin-tazobactam (ZOSYN)  IV  3.375 g Intravenous Q12H  . sodium chloride flush  3 mL Intravenous Q12H   acetaminophen **OR** acetaminophen, benzonatate, bisacodyl, HYDROcodone-acetaminophen, ibuprofen, ipratropium-albuterol, lubiprostone, magnesium citrate, meclizine, methocarbamol, nitroGLYCERIN, ondansetron **OR** ondansetron (ZOFRAN) IV, oxyCODONE, senna-docusate, vancomycin, zolpidem  Assessment/ Plan:  65 y.o. female with hypertension, coronary artery disease s/p CABG, peripheral vascular disease, diabetes mellitus type II, hyperlipidemia, bilateral total knee replacement, ESRD, anemia of CKD, SHPTH who presents now with fever and infiltrates on CXR.  1.  ESRD on HD MWF [facility].   -  Patient had HD yesterday, no acute indication for HD today, will plan for HD again  tomorrow.  2.  Fever/suspected bacterial pneumonia:  Blood cultures pending, had fever during HD yesterday, infiltrates on CXR noted, continue zosyn for now.   3.  Anemia of CKD: hgb 10.4, resume epogen with HD.   4.  SHPTH:  Check ipth/phos with HD tomorrow, resume calcium acetate 2 tabs po tid/wm.   LOS: 1 Darah Simkin 10/18/201711:23 AM

## 2016-07-06 NOTE — Progress Notes (Signed)
Pharmacy Antibiotic Note  Kimberly Shaffer is a 65 y.o. female admitted on 07/05/2016 with sepsis.  Pharmacy has been consulted for vancomycin and Zosyn dosing.  Plan: ESRD on HD DW 77kg  Vancomycin 1750 mg x1 ordered as loading dose. 750 mg every HD ordered as continuation. Level will need to be ordered when HD schedule is established.  Zosyn 3.375 grams q 12 hours ordered.  Height: 5\' 6"  (167.6 cm) Weight: 228 lb (103.4 kg) (weighed today in dialysis) IBW/kg (Calculated) : 59.3  Temp (24hrs), Avg:100.8 F (38.2 C), Min:99.1 F (37.3 C), Max:103 F (39.4 C)   Recent Labs Lab 07/05/16 1842 07/05/16 2156  WBC 4.5  --   CREATININE 3.88*  --   LATICACIDVEN 2.6* 0.9    Estimated Creatinine Clearance: 17.5 mL/min (by C-G formula based on SCr of 3.88 mg/dL (H)).    Allergies  Allergen Reactions  . Nystatin Hives and Itching  . Sulfa Antibiotics Swelling, Hives and Rash  . Niaspan [Niacin] Other (See Comments) and Hives    Antimicrobials this admission: vancomycin  >>  Zosyn  >>   Dose adjustments this admission:   Microbiology results: 10/17 BCx: pending 10/17 UCx: pending  10/17 MRSA PCR: pending    10/17 UA: (-) 10/17 CXR: pulmonary edema  Thank you for allowing pharmacy to be a part of this patient's care.  Chesley Veasey S 07/06/2016 12:25 AM

## 2016-07-06 NOTE — H&P (Signed)
[facility] Admission History and Physical [employer], D.O.  ---------------------------------------------------------------------------------------------------------------------   PATIENT NAME: Kimberly Shaffer MR#: [id number] DATE OF BIRTH: [birth year] DATE OF ADMISSION: 07/05/2016 PRIMARY CARE PHYSICIAN: Pcp Not In System  REQUESTING/REFERRING PHYSICIAN: ED Dr. Kerman Passey   CHIEF COMPLAINT: Chief Complaint  Patient presents with  . Fever  . Shortness of Breath    HISTORY OF PRESENT ILLNESS: Stellar Gensel is a 65 y.o. female with a known history of End-stage renal disease on dialysis, hypertension, COPD, diabetes and MI presents to the emergency department complaining of nausea, generalized weakness and fever.  Patient was in a usual state of health until 3 days ago when she developed a fever associated with shortness of breath, cough productive of yellow sputum, fever to 103 and weakness. She states her last dialysis was this morning and was unremarkable. She has make urine about 3-4 times per day. She has not been hospitalized recently and has not been taking any antibiotics.  Otherwise there has been no change in status. Patient has been taking medication as prescribed and there has been no recent change in medication or diet.  There has been no recent illness, travel or sick contacts.    Patient denies fevers/chills, weakness, dizziness, chest pain, shortness of breath, N/V/C/D, abdominal pain, dysuria/frequency, changes in mental status.   EMS/ED COURSE:   Patient received Zosyn, Vanco.  PAST MEDICAL HISTORY: Past Medical History:  Diagnosis Date  . Asthma   . CAD (coronary artery disease)   . COPD (chronic obstructive pulmonary disease) ([place])   . Depression   . Diabetes mellitus without complication (HCC)    NIDD  . H/O angina pectoris   . H/O blood clots   . Hypercholesteremia   . Hypertension   . Myocardial infarction   . Obesity    . Renal insufficiency   . Stroke ([place])   . Vertigo, aural       PAST SURGICAL HISTORY: Past Surgical History:  Procedure Laterality Date  . AV FISTULA PLACEMENT Left 11-04-2015  . AV FISTULA PLACEMENT Left 11/04/2015   Procedure: ARTERIOVENOUS (AV) FISTULA CREATION  ( BRACHIAL CEPHALIC );  Surgeon: Katha Cabal, MD;  Location: [facility] ORS;  Service: Vascular;  Laterality: Left;  . CARDIAC CATHETERIZATION    . CORONARY ARTERY BYPASS GRAFT  2012  . CORONARY STENT PLACEMENT    . JOINT REPLACEMENT    . KNEE SURGERY Bilateral   . PERIPHERAL VASCULAR CATHETERIZATION N/A 11/13/2015   Procedure: Dialysis/Perma Catheter Insertion;  Surgeon: Katha Cabal, MD;  Location: [place] [facility];  Service: Cardiovascular;  Laterality: N/A;  . PERIPHERAL VASCULAR CATHETERIZATION N/A 12/04/2015   Procedure: Dialysis/Perma Catheter Insertion;  Surgeon: Katha Cabal, MD;  Location: [place] [facility];  Service: Cardiovascular;  Laterality: N/A;  . PERIPHERAL VASCULAR CATHETERIZATION Left 12/31/2015   Procedure: A/V Shuntogram/Fistulagram;  Surgeon: Algernon Huxley, MD;  Location: [place] [facility];  Service: Cardiovascular;  Laterality: Left;  . PERIPHERAL VASCULAR CATHETERIZATION N/A 12/31/2015   Procedure: A/V Shunt Intervention;  Surgeon: Algernon Huxley, MD;  Location: [place] [facility];  Service: Cardiovascular;  Laterality: N/A;  . PERIPHERAL VASCULAR CATHETERIZATION Left 02/25/2016   Procedure: A/V Shuntogram/Fistulagram;  Surgeon: Algernon Huxley, MD;  Location: [place] [facility];  Service: Cardiovascular;  Laterality: Left;  . PERIPHERAL VASCULAR CATHETERIZATION N/A 03/18/2016   Procedure: Dialysis/Perma Catheter Removal;  Surgeon: Katha Cabal, MD;  Location: [place] [facility];  Service: Cardiovascular;  Laterality: N/A;  . REPLACEMENT TOTAL KNEE BILATERAL Bilateral       SOCIAL HISTORY: Social History  Substance Use Topics  . Smoking status: Current Every Day Smoker     Packs/day: 0.50    Types: Cigarettes  . Smokeless tobacco: Never Used  . Alcohol use No      FAMILY HISTORY: Family History  Problem Relation Age of Onset  . Cancer Mother   . Cancer Father   . Diabetes Brother   . Heart disease Brother      MEDICATIONS AT HOME: Prior to Admission medications   Medication Sig Start Date End Date Taking? Authorizing Provider  albuterol (PROVENTIL HFA;VENTOLIN HFA) 108 (90 BASE) MCG/ACT inhaler Inhale into the lungs every 4 (four) hours as needed for wheezing or shortness of breath. 2-4 puffs   Yes Historical Provider, MD  amLODipine (NORVASC) 10 MG tablet Take 10 mg by mouth daily.   Yes Historical Provider, MD  aspirin 81 MG tablet Take 81 mg by mouth daily.   Yes Historical Provider, MD  atorvastatin (LIPITOR) 80 MG tablet Take 80 mg by mouth daily.   Yes Historical Provider, MD  calcium carbonate (OSCAL) 1500 (600 Ca) MG TABS tablet Take 600 mg of elemental calcium by mouth daily with breakfast.   Yes Historical Provider, MD  cefUROXime (CEFTIN) 500 MG tablet Limit 1 tab by mouth twice per day if tolerated 05/25/16  Yes Mohammed Kindle, MD  Cholecalciferol (VITAMIN D3) 5000 UNITS CAPS Take 1 tablet by mouth daily.   Yes Historical Provider, MD  diclofenac (FLECTOR) 1.3 % PTCH Apply one patch for a portion of patch to painful area of skin for strain, sprain, and spasms of the upper back and shoulder twice per day if tolerated 04/13/16  Yes Mohammed Kindle, MD  DULoxetine (CYMBALTA) 60 MG capsule Take 60 mg by mouth daily.   Yes Historical Provider, MD  Fluticasone-Salmeterol (ADVAIR DISKUS) 250-50 MCG/DOSE AEPB Inhale 1 puff into the lungs 2 (two) times daily. 01/19/16 01/18/17 Yes Laverle Hobby, MD  FOLIC ACID PO Take 1 mg by mouth daily.    Yes Historical Provider, MD  ipratropium (ATROVENT) 0.02 % nebulizer solution Take 2.5 mLs (0.5 mg total) by nebulization every 6 (six) hours as needed for wheezing or shortness of breath. 01/19/16  Yes Laverle Hobby, MD  lubiprostone (AMITIZA) 24 MCG capsule Take 24 mcg by mouth daily as needed for constipation.   Yes Historical Provider, MD  meclizine (ANTIVERT) 25 MG tablet Take 25 mg by mouth 3 (three) times daily as needed for dizziness.   Yes Historical Provider, MD  methocarbamol (ROBAXIN) 750 MG tablet Take 750 mg by mouth daily as needed for muscle spasms.   Yes Historical Provider, MD  metoprolol succinate (TOPROL-XL) 100 MG 24 hr tablet Take 100 mg by mouth daily. Take with or immediately following a meal.   Yes Historical Provider, MD  Multiple Vitamins-Minerals (ONE-A-DAY WOMENS 50 PLUS PO) Take 1 tablet by mouth daily.   Yes Historical Provider, MD  nitroGLYCERIN (NITROLINGUAL) 0.4 MG/SPRAY spray Place 1 spray under the tongue every 5 (five) minutes x 3 doses as needed for chest pain.   Yes Historical Provider, MD  Omega-3 Fatty Acids (FISH OIL) 1000 MG CPDR Take by mouth daily.    Yes Historical Provider, MD  oxyCODONE (ROXICODONE) 5 MG immediate release tablet Take 1 tablet (5 mg total) by mouth every 8 (eight) hours as needed. 06/27/16 06/27/17 Yes Cari B Triplett, FNP  pantoprazole (PROTONIX) 40  MG tablet Take 40 mg by mouth 2 (two) times daily.   Yes Historical Provider, MD  Pramoxine-Dimethicone 1-6 % CREA Apply topically.    Historical Provider, MD      DRUG ALLERGIES: Allergies  Allergen Reactions  . Nystatin Hives and Itching  . Sulfa Antibiotics Swelling, Hives and Rash  . Niaspan [Niacin] Other (See Comments) and Hives     REVIEW OF SYSTEMS: CONSTITUTIONAL: Positive fatigue, weakness, fever, chills, weight gain/loss, headache EYES: No blurry or double vision. ENT: No tinnitus, postnasal drip, redness or soreness of the oropharynx. RESPIRATORY: Positive dyspnea, cough, negative wheeze, hemoptysis. CARDIOVASCULAR: No chest pain, orthopnea, palpitations, syncope. GASTROINTESTINAL: No nausea, vomiting, constipation, diarrhea, abdominal pain. No hematemesis, melena or  hematochezia. GENITOURINARY: No dysuria, frequency, hematuria. ENDOCRINE: No polyuria or nocturia. No heat or cold intolerance. HEMATOLOGY: No anemia, bruising, bleeding. INTEGUMENTARY: No rashes, ulcers, lesions. MUSCULOSKELETAL: No pain, arthritis, swelling, gout. NEUROLOGIC: No numbness, tingling, weakness or ataxia. No seizure-type activity. PSYCHIATRIC: No anxiety, depression, insomnia.  PHYSICAL EXAMINATION: VITAL SIGNS: Blood pressure (!) 125/49, pulse 83, temperature 99.1 F (37.3 C), temperature source Oral, resp. rate 19, height 5\' 6"  (1.676 m), weight 103.4 kg (228 lb), SpO2 100 %.  GENERAL: 65 y.o.-year-old black female patient, well-developed, well-nourished lying in the bed in no acute distress.  Pleasant and cooperative.   HEENT: Head atraumatic, normocephalic. Pupils equal, round, reactive to light and accommodation. No scleral icterus. Extraocular muscles intact. Oropharynx is clear. Mucus membranes moist. NECK: Supple, full range of motion. No JVD, no bruit heard. No cervical lymphadenopathy. CHEST: Normal breath sounds bilaterally. No wheezing, rales, rhonchi or crackles. No use of accessory muscles of respiration.  No reproducible chest wall tenderness.  CARDIOVASCULAR: S1, S2 normal. No murmurs, rubs, or gallops appreciated. Cap refill <2 seconds. ABDOMEN: Soft, nontender, nondistended. No rebound, guarding, rigidity. Normoactive bowel sounds present in all four quadrants. No organomegaly or mass. EXTREMITIES: Full range of motion. No pedal edema, cyanosis, or clubbing. NEUROLOGIC: Cranial nerves II through XII are grossly intact with no focal sensorimotor deficit. Muscle strength 5/5 in all extremities. Sensation intact. Gait not checked. PSYCHIATRIC: The patient is alert and oriented x 3. Normal affect, mood, thought content. SKIN: Warm, dry, and intact without obvious rash, lesion, or ulcer.  LABORATORY PANEL:  CBC  Recent Labs Lab 07/05/16 1842  WBC 4.5  HGB  11.2*  HCT 34.4*  PLT 134*   ----------------------------------------------------------------------------------------------------------------- Chemistries  Recent Labs Lab 07/05/16 1842  NA 135  K 3.4*  CL 95*  CO2 28  GLUCOSE 104*  BUN 20  CREATININE 3.88*  CALCIUM 8.0*  MG 1.4*  AST 31  ALT 13*  ALKPHOS 66  BILITOT 0.8   ------------------------------------------------------------------------------------------------------------------ Cardiac Enzymes  Recent Labs Lab 07/05/16 1842  TROPONINI 0.03*   ------------------------------------------------------------------------------------------------------------------  RADIOLOGY: Dg Chest 2 View  Result Date: 07/05/2016 CLINICAL DATA:  Fever, chills, shortness of breath. EXAM: CHEST  2 VIEW COMPARISON:  Radiographs of December 19, 2015. FINDINGS: Stable cardiomediastinal silhouette. No pneumothorax or significant pleural effusion is noted. Multilevel degenerative disc disease is noted in the thoracic spine. Mildly increased basilar interstitial densities are noted concerning for worsening pulmonary edema. IMPRESSION: Mildly increased bibasilar interstitial densities concerning for worsening edema. Electronically Signed   By: Marijo Conception, M.D.   On: 07/05/2016 18:58   Dg Hip Unilat With Pelvis 2-3 Views Right  Result Date: 07/06/2016 CLINICAL DATA:  Subacute onset of right hip pain. Initial encounter. EXAM: DG HIP (WITH OR WITHOUT PELVIS) 2-3V RIGHT  COMPARISON:  None. FINDINGS: There is no evidence of fracture or dislocation. Axial joint space narrowing is noted at the right hip, with mild sclerosis and subcortical cystic change. The proximal right femur appears intact. Mild degenerative change is noted at the sacroiliac joints, pubic symphysis and lower lumbar spine. The left hip joint is grossly unremarkable in appearance. The visualized bowel gas pattern is grossly unremarkable in appearance. Scattered phleboliths are noted  within the pelvis. IMPRESSION: 1. Axial joint space narrowing at the right hip, with mild sclerosis and subcortical cystic change. 2. No evidence of fracture or dislocation. Electronically Signed   By: Garald Balding M.D.   On: 07/06/2016 01:16    EKG: Sinus tachycardia at 109 bpm with normal axis, lateral T-wave inversions and nonspecific ST-T wave changes.   IMPRESSION AND PLAN:  This is a 65 y.o. female with a history of End-stage renal disease on dialysis, hypertension, COPD, diabetes and MI  now being admitted with: 1. Sepsis, symptoms consistent with pneumonia however chest x-ray shows only concern for bilateral edema. Patient does have a urinary tract infection. As a dialysis patient we will admit for inpatient treatment with IV antibiotics, Zosyn and vancomycin, follow blood, urine and sputum cultures. O2, DuoNeb's, Mucinex and Tessalon have been ordered. 2. Mild elevation in troponin likely secondary to end-stage renal disease.-We'll trend 3. Anemia, chronic-we'll repeat BMP in a.m. 4. History of hypertension-continue Norvasc, metoprolol 5. History of hyperlipidemia-continue Lipitor 6. History of COPD-continue duo nebs 7. History of chronic pain-continue Flector patch, Robaxin 8. History of CAD-continue aspirin, nitroglycerin 9. History of vertigo-continue Antivert 10. History of diabetes-Accu-Cheks at before meals at bedtime insulin sliding coverage.  Diet/Nutrition:  Heart healthy, carb controlled Fluids:  IV normal saline DVT Px: Heparin, SCDs and early ambulation Code Status: Full  All the records are reviewed and case discussed with ED provider. Management plans discussed with the patient and/or family who express understanding and agree with plan of care.   TOTAL TIME TAKING CARE OF THIS PATIENT: 60 minutes.   Oneil Behney D.O. on 07/06/2016 at 2:35 AM Between 7am to 6pm - Pager - [phone] After 6pm go to [email or web address] - [employer]  [place] [facility] Office [phone] CC: Primary care physician; Pcp Not In System     Note: This dictation was prepared with Dragon dictation along with smaller phrase technology. Any transcriptional errors that result from this process are unintentional.

## 2016-07-07 ENCOUNTER — Inpatient Hospital Stay: Payer: Medicare HMO

## 2016-07-07 LAB — CBC
HCT: 35.1 % (ref 35.0–47.0)
Hemoglobin: 11.3 g/dL — ABNORMAL LOW (ref 12.0–16.0)
MCH: 29.5 pg (ref 26.0–34.0)
MCHC: 32.1 g/dL (ref 32.0–36.0)
MCV: 91.8 fL (ref 80.0–100.0)
PLATELETS: 113 10*3/uL — AB (ref 150–440)
RBC: 3.82 MIL/uL (ref 3.80–5.20)
RDW: 18 % — AB (ref 11.5–14.5)
WBC: 2.6 10*3/uL — AB (ref 3.6–11.0)

## 2016-07-07 LAB — URINE CULTURE: Culture: NO GROWTH

## 2016-07-07 LAB — GLUCOSE, CAPILLARY
GLUCOSE-CAPILLARY: 82 mg/dL (ref 65–99)
GLUCOSE-CAPILLARY: 93 mg/dL (ref 65–99)
GLUCOSE-CAPILLARY: 88 mg/dL (ref 65–99)
Glucose-Capillary: 82 mg/dL (ref 65–99)

## 2016-07-07 LAB — EXPECTORATED SPUTUM ASSESSMENT W REFEX TO RESP CULTURE: SPECIAL REQUESTS: NORMAL

## 2016-07-07 LAB — EXPECTORATED SPUTUM ASSESSMENT W GRAM STAIN, RFLX TO RESP C

## 2016-07-07 LAB — PHOSPHORUS: PHOSPHORUS: 4.5 mg/dL (ref 2.5–4.6)

## 2016-07-07 MED ORDER — SODIUM CHLORIDE 0.9 % IV SOLN: 100.0000 mL | INTRAVENOUS | Status: DC | PRN

## 2016-07-07 MED ORDER — HEPARIN SODIUM (PORCINE) 1000 UNIT/ML DIALYSIS: 1000.0000 [IU] | INTRAMUSCULAR | Status: DC | PRN

## 2016-07-07 MED ORDER — DOXYCYCLINE HYCLATE 100 MG PO TABS
100.0000 mg | ORAL_TABLET | Freq: Two times a day (BID) | ORAL | Status: DC
  Administered 2016-07-07 – 2016-07-08 (×3): 100 mg via ORAL
  Filled 2016-07-07 (×3): qty 1

## 2016-07-07 MED ORDER — LIDOCAINE HCL (PF) 1 % IJ SOLN
5.0000 mL | INTRAMUSCULAR | Status: DC | PRN
  Filled 2016-07-07: qty 5

## 2016-07-07 MED ORDER — LIDOCAINE-PRILOCAINE 2.5-2.5 % EX CREA
1.0000 "application " | TOPICAL_CREAM | CUTANEOUS | Status: DC | PRN
  Filled 2016-07-07: qty 5

## 2016-07-07 MED ORDER — PENTAFLUOROPROP-TETRAFLUOROETH EX AERO
1.0000 "application " | INHALATION_SPRAY | CUTANEOUS | Status: DC | PRN
  Filled 2016-07-07: qty 30

## 2016-07-07 MED ORDER — ALTEPLASE 2 MG IJ SOLR: 2.0000 mg | Freq: Once | INTRAMUSCULAR | Status: DC | PRN

## 2016-07-07 NOTE — Progress Notes (Signed)
[facility]  ROUNDING NOTE   Subjective:  Patient seen and evaluated during hemodialysis. She continues to have periods of fever. Temperature has been as high as 103. Blood cultures thus far remain negative.   Objective:  Vital signs in last 24 hours:  Temp:  [98.5 F (36.9 C)-101.2 F (38.4 C)] 99.2 F (37.3 C) (10/19 0940) Pulse Rate:  [74-94] 85 (10/19 1130) Resp:  [14-29] 29 (10/19 1130) BP: (129-158)/(62-81) 140/76 (10/19 1100) SpO2:  [94 %-100 %] 98 % (10/19 1130)  Weight change:  Filed Weights   07/05/16 1824 07/05/16 1932  Weight: 103.4 kg (228 lb) 103.4 kg (228 lb)    Intake/Output: I/O last 3 completed shifts: In: 1920 [P.O.:1164; I.V.:6; IV Piggyback:750] Out: 151 [Urine:150; Stool:1]   Intake/Output this shift:  No intake/output data recorded.  Physical Exam: General: No acute distress  Head: Normocephalic, atraumatic. Moist oral mucosal membranes  Eyes: Anicteric  Neck: Supple, trachea midline  Lungs:  Basilar rales, normal effort  Heart: S1S2 no rubs  Abdomen:  Soft, nontender, bowel sounds present  Extremities: no peripheral edema.  Neurologic: Nonfocal, moving all four extremities  Skin: No lesions  Access: Left upper extremity access    Basic Metabolic Panel:  Recent Labs Lab 07/05/16 1842 07/06/16 0619 07/07/16 1014  NA 135 133*  --   K 3.4* 3.8  --   CL 95* 97*  --   CO2 28 25  --   GLUCOSE 104* 95  --   BUN 20 25*  --   CREATININE 3.88* 4.92*  --   CALCIUM 8.0* 7.7*  --   MG 1.4*  --   --   PHOS 1.8*  --  4.5    Liver Function Tests:  Recent Labs Lab 07/05/16 1842  AST 31  ALT 13*  ALKPHOS 66  BILITOT 0.8  PROT 7.7  ALBUMIN 3.3*   No results for input(s): LIPASE, AMYLASE in the last 168 hours. No results for input(s): AMMONIA in the last 168 hours.  CBC:  Recent Labs Lab 07/05/16 1842 07/06/16 0619 07/07/16 0614  WBC 4.5 3.5* 2.6*  NEUTROABS 3.3  --   --   HGB 11.2* 10.4* 11.3*  HCT 34.4*  31.6* 35.1  MCV 90.6 90.2 91.8  PLT 134* 112* 113*    Cardiac Enzymes:  Recent Labs Lab 07/05/16 1842  TROPONINI 0.03*    BNP: Invalid input(s): POCBNP  CBG:  Recent Labs Lab 07/06/16 0841 07/06/16 1125 07/06/16 1640 07/06/16 2127 07/07/16 0725  GLUCAP 89 77 99 81 82    Microbiology: Results for orders placed or performed during the hospital encounter of 07/05/16  Culture, blood (Routine x 2)     Status: None (Preliminary result)   Collection Time: 07/05/16  6:42 PM  Result Value Ref Range Status   Specimen Description BLOOD RIGHT WRIST  Final   Special Requests BAA5ML BAE5ML  Final   Culture NO GROWTH 2 DAYS  Final   Report Status PENDING  Incomplete  Culture, blood (Routine x 2)     Status: None (Preliminary result)   Collection Time: 07/05/16  6:47 PM  Result Value Ref Range Status   Specimen Description BLOOD RIGHT AC  Final   Special Requests BAA 5ML BAE 5ML  Final   Culture NO GROWTH 2 DAYS  Final   Report Status PENDING  Incomplete  Urine culture     Status: None   Collection Time: 07/05/16  8:37 PM  Result Value Ref Range Status  Specimen Description URINE, RANDOM  Final   Special Requests NONE  Final   Culture   Final    NO GROWTH 1 DAY Performed at [facility]    Report Status 07/07/2016 FINAL  Final  MRSA PCR Screening     Status: None   Collection Time: 07/05/16 11:45 PM  Result Value Ref Range Status   MRSA by PCR NEGATIVE NEGATIVE Final    Comment:        The GeneXpert MRSA Assay (FDA approved for NASAL specimens only), is one component of a comprehensive MRSA colonization surveillance program. It is not intended to diagnose MRSA infection nor to guide or monitor treatment for MRSA infections.   Culture, sputum-assessment     Status: None   Collection Time: 07/06/16 12:20 PM  Result Value Ref Range Status   Specimen Description SPUTUM  Final   Special Requests Normal  Final   Sputum evaluation   Final    Sputum specimen  not acceptable for testing.  Please recollect.   C/OBETH RICHARDSON RN AT 1856 07/06/16 MSS    Report Status 07/06/2016 FINAL  Final  C difficile quick scan w PCR reflex     Status: None   Collection Time: 07/06/16  1:43 PM  Result Value Ref Range Status   C Diff antigen NEGATIVE NEGATIVE Final   C Diff toxin NEGATIVE NEGATIVE Final   C Diff interpretation No C. difficile detected.  Final    Coagulation Studies: No results for input(s): LABPROT, INR in the last 72 hours.  Urinalysis:  Recent Labs  07/05/16 2037  COLORURINE YELLOW*  LABSPEC 1.012  PHURINE 9.0*  GLUCOSEU NEGATIVE  HGBUR NEGATIVE  BILIRUBINUR NEGATIVE  KETONESUR NEGATIVE  PROTEINUR >500*  NITRITE NEGATIVE  LEUKOCYTESUR NEGATIVE      Imaging: Dg Chest 2 View  Result Date: 07/05/2016 CLINICAL DATA:  Fever, chills, shortness of breath. EXAM: CHEST  2 VIEW COMPARISON:  Radiographs of December 19, 2015. FINDINGS: Stable cardiomediastinal silhouette. No pneumothorax or significant pleural effusion is noted. Multilevel degenerative disc disease is noted in the thoracic spine. Mildly increased basilar interstitial densities are noted concerning for worsening pulmonary edema. IMPRESSION: Mildly increased bibasilar interstitial densities concerning for worsening edema. Electronically Signed   By: Marijo Conception, M.D.   On: 07/05/2016 18:58   Dg Hip Unilat With Pelvis 2-3 Views Right  Result Date: 07/06/2016 CLINICAL DATA:  Subacute onset of right hip pain. Initial encounter. EXAM: DG HIP (WITH OR WITHOUT PELVIS) 2-3V RIGHT COMPARISON:  None. FINDINGS: There is no evidence of fracture or dislocation. Axial joint space narrowing is noted at the right hip, with mild sclerosis and subcortical cystic change. The proximal right femur appears intact. Mild degenerative change is noted at the sacroiliac joints, pubic symphysis and lower lumbar spine. The left hip joint is grossly unremarkable in appearance. The visualized bowel gas  pattern is grossly unremarkable in appearance. Scattered phleboliths are noted within the pelvis. IMPRESSION: 1. Axial joint space narrowing at the right hip, with mild sclerosis and subcortical cystic change. 2. No evidence of fracture or dislocation. Electronically Signed   By: Garald Balding M.D.   On: 07/06/2016 01:16     Medications:     . amLODipine  10 mg Oral Daily  . aspirin  81 mg Oral Daily  . atorvastatin  80 mg Oral Daily  . calcium acetate  1,334 mg Oral TID WC  . calcium carbonate  3 tablet Oral Q breakfast  . cholecalciferol  5,000 Units Oral Daily  . guaiFENesin  600 mg Oral BID   And  . dextromethorphan  30 mg Oral BID  . diclofenac  1 patch Transdermal BID  . DULoxetine  60 mg Oral Daily  . folic acid  1 mg Oral Daily  . heparin  5,000 Units Subcutaneous Q8H  . insulin aspart  0-20 Units Subcutaneous TID WC  . insulin aspart  0-5 Units Subcutaneous QHS  . metoprolol succinate  100 mg Oral Daily  . mometasone-formoterol  2 puff Inhalation BID  . omega-3 acid ethyl esters  1 g Oral BID  . pantoprazole  40 mg Oral BID  . piperacillin-tazobactam (ZOSYN)  IV  3.375 g Intravenous Q12H  . sodium chloride flush  3 mL Intravenous Q12H  . vancomycin  750 mg Intravenous Q T,Th,Sa-HD   acetaminophen **OR** acetaminophen, benzonatate, bisacodyl, HYDROcodone-acetaminophen, ibuprofen, ipratropium-albuterol, lubiprostone, magnesium citrate, meclizine, methocarbamol, nitroGLYCERIN, ondansetron **OR** ondansetron (ZOFRAN) IV, oxyCODONE, senna-docusate, zolpidem  Assessment/ Plan:  65 y.o. female with hypertension, coronary artery disease s/p CABG, peripheral vascular disease, diabetes mellitus type II, hyperlipidemia, bilateral total knee replacement, ESRD, anemia of CKD, SHPTH who presents now with fever and infiltrates on CXR.  1.  ESRD on HD MWF [place].   -  Patient seen and evaluated during hemodialysis.  2.  Fever/suspected bacterial pneumonia:  Patient  still spiking fevers. Influenza screen negative and blood cultures remain negative. Continue vancomycin and Zosyn for now.  3.  Anemia of CKD: Hemoglobin up to 11.3. Hold off on Epogen.   4.  SHPTH:  Phosphorous well controlled at 4.5, continue calcium acetate 2 tabs po tid/wm.   LOS: 2 Charniece Venturino 10/19/201711:43 AM

## 2016-07-07 NOTE — Progress Notes (Signed)
Post hd tx 

## 2016-07-07 NOTE — Progress Notes (Signed)
Hemodialysis completed. 

## 2016-07-07 NOTE — Progress Notes (Signed)
Hemodialysis started

## 2016-07-07 NOTE — Progress Notes (Signed)
Pre-hd tx 

## 2016-07-07 NOTE — Progress Notes (Signed)
[facility] [place] NAME: Kimberly Shaffer    MR#:  [id number]  DATE OF BIRTH:  03-Sep-1951  SUBJECTIVE:  CHIEF COMPLAINT:   Chief Complaint  Patient presents with  . Fever  . Shortness of Breath   The patient is a 65 year old African-American female with physical history significant history of end-stage renal disease, hypertension, COPD, diabetes mellitus, coronary artery disease, who presents to the hospital with complaints of fever, cough, sputum production. Chest x-ray was concerning for CHF versus pneumonia, patient was initiated on broad-spectrum antibiotic therapy, fevers, subsiding, however, patient continues to have chills despite antibiotic therapy for the past 48 hours, although she admits feeling better overall, remains somnolent today, not able to review systems. Continuous to have loose stools over the past 24 hours   Review of Systems  Constitutional: Negative for chills, fever and weight loss.  HENT: Negative for congestion.   Eyes: Negative for blurred vision and double vision.  Respiratory: Positive for cough, sputum production and shortness of breath. Negative for wheezing.   Cardiovascular: Negative for chest pain, palpitations, orthopnea, leg swelling and PND.  Gastrointestinal: Positive for diarrhea. Negative for abdominal pain, blood in stool, constipation, nausea and vomiting.  Genitourinary: Negative for dysuria, frequency, hematuria and urgency.  Musculoskeletal: Negative for falls.  Neurological: Negative for dizziness, tremors, focal weakness and headaches.  Endo/Heme/Allergies: Does not bruise/bleed easily.  Psychiatric/Behavioral: Negative for depression. The patient does not have insomnia.     VITAL SIGNS: Blood pressure (!) 126/58, pulse 78, temperature 99.2 F (37.3 C), temperature source Oral, resp. rate (!) 21, height 5\' 6"  (1.676 m), weight 103.4 kg (228 lb), SpO2 98 %.  PHYSICAL EXAMINATION:    GENERAL:  65 y.o.-year-old patient lying in the bed with no acute distress. Very somnolent today, barely opens  eyes EYES: Pupils equal, round, reactive to light and accommodation. No scleral icterus. Extraocular muscles intact.  HEENT: Head atraumatic, normocephalic. Oropharynx and nasopharynx clear.  NECK:  Supple, no jugular venous distention. No thyroid enlargement, no tenderness.  LUNGS: Normal breath sounds bilaterally, no wheezing,  rales,rhonchi or crepitations noted today on auscultation. No use of accessory muscles of respiration.  CARDIOVASCULAR: S1, S2 normal. No murmurs, rubs, or gallops.  ABDOMEN: Soft, nontender, nondistended. Bowel sounds present. No organomegaly or mass.  EXTREMITIES: No pedal edema, cyanosis, or clubbing.  NEUROLOGIC: Cranial nerves II through XII are intact. Muscle strength 5/5 in all extremities. Sensation intact. Gait not checked.  PSYCHIATRIC: The patient is somnolent , able to open eyes and converses briefly but then drifts back to sleep , oriented x 3.  SKIN: No obvious rash, lesion, or ulcer.   ORDERS/RESULTS REVIEWED:   CBC  Recent Labs Lab 07/05/16 1842 07/06/16 0619 07/07/16 0614  WBC 4.5 3.5* 2.6*  HGB 11.2* 10.4* 11.3*  HCT 34.4* 31.6* 35.1  PLT 134* 112* 113*  MCV 90.6 90.2 91.8  MCH 29.4 29.8 29.5  MCHC 32.5 33.0 32.1  RDW 18.0* 17.9* 18.0*  LYMPHSABS 0.5*  --   --   MONOABS 0.5  --   --   EOSABS 0.2  --   --   BASOSABS 0.0  --   --    ------------------------------------------------------------------------------------------------------------------  Chemistries   Recent Labs Lab 07/05/16 1842 07/06/16 0619  NA 135 133*  K 3.4* 3.8  CL 95* 97*  CO2 28 25  GLUCOSE 104* 95  BUN 20 25*  CREATININE 3.88* 4.92*  CALCIUM 8.0* 7.7*  MG 1.4*  --  AST 31  --   ALT 13*  --   ALKPHOS 66  --   BILITOT 0.8  --     ------------------------------------------------------------------------------------------------------------------ estimated creatinine clearance is 13.8 mL/min (by C-G formula based on SCr of 4.92 mg/dL (H)). ------------------------------------------------------------------------------------------------------------------ No results for input(s): TSH, T4TOTAL, T3FREE, THYROIDAB in the last 72 hours.  Invalid input(s): FREET3  Cardiac Enzymes  Recent Labs Lab 07/05/16 1842  TROPONINI 0.03*   ------------------------------------------------------------------------------------------------------------------ Invalid input(s): POCBNP ---------------------------------------------------------------------------------------------------------------  RADIOLOGY: Dg Chest 2 View  Result Date: 07/05/2016 CLINICAL DATA:  Fever, chills, shortness of breath. EXAM: CHEST  2 VIEW COMPARISON:  Radiographs of December 19, 2015. FINDINGS: Stable cardiomediastinal silhouette. No pneumothorax or significant pleural effusion is noted. Multilevel degenerative disc disease is noted in the thoracic spine. Mildly increased basilar interstitial densities are noted concerning for worsening pulmonary edema. IMPRESSION: Mildly increased bibasilar interstitial densities concerning for worsening edema. Electronically Signed   By: Marijo Conception, M.D.   On: 07/05/2016 18:58   Dg Hip Unilat With Pelvis 2-3 Views Right  Result Date: 07/06/2016 CLINICAL DATA:  Subacute onset of right hip pain. Initial encounter. EXAM: DG HIP (WITH OR WITHOUT PELVIS) 2-3V RIGHT COMPARISON:  None. FINDINGS: There is no evidence of fracture or dislocation. Axial joint space narrowing is noted at the right hip, with mild sclerosis and subcortical cystic change. The proximal right femur appears intact. Mild degenerative change is noted at the sacroiliac joints, pubic symphysis and lower lumbar spine. The left hip joint is grossly unremarkable in  appearance. The visualized bowel gas pattern is grossly unremarkable in appearance. Scattered phleboliths are noted within the pelvis. IMPRESSION: 1. Axial joint space narrowing at the right hip, with mild sclerosis and subcortical cystic change. 2. No evidence of fracture or dislocation. Electronically Signed   By: Garald Balding M.D.   On: 07/06/2016 01:16    EKG:  Orders placed or performed during the hospital encounter of 07/05/16  . EKG 12-Lead  . EKG 12-Lead    ASSESSMENT AND PLAN:  Active Problems:   Sepsis due to pneumonia ([place]) #1. Sepsis suspected due to pneumonia, continue patient on broad-spectrum antibody therapy with vancomycin and Zosyn, get sputum cultures if possible, adjust antibiotics depending on culture results, influenza testing was negative. Blood cultures 2, urine culture was negative. Getting ID involved recommendations since patient continues to have shaking chills. Get C. difficile testing since patient has diarrheal stool.  #2  Bacterial pneumonia, get sputum cultures if possible, continue broad-spectrum antibiotic therapy, repeat chest x-ray in the morning #3. Diarrhea, ?related due to antibiotics, rule out infectious. Get C. difficile testing #4. End-stage renal disease, dialysis today per schedule, appreciate nephrology's input #5 Pancytopenia, worsening CBC today, ?infection related, initiate doxycycline, get RMSF, ehrichia, Lyme serology   Management plans discussed with the patient, family and they are in agreement.   DRUG ALLERGIES:  Allergies  Allergen Reactions  . Nystatin Hives and Itching  . Sulfa Antibiotics Swelling, Hives and Rash  . Niaspan [Niacin] Other (See Comments) and Hives    CODE STATUS:     Code Status Orders        Start     Ordered   07/05/16 2237  Full code  Continuous     07/05/16 2236    Code Status History    Date Active Date Inactive Code Status Order ID Comments User Context   12/17/2015  8:12 AM 12/20/2015  7:57 PM  Full Code [id number]  Saundra Shelling, MD Inpatient  TOTAL TIME TAKING CARE OF THIS PATIENT: 40 minutes.  Discussed with Dr. Shela Commons M.D on 07/07/2016 at 12:47 PM  Between 7am to 6pm - Pager - [phone]  After 6pm go to [email or web address] - password [email or web address] [facility]  Office  [phone]  CC: Primary care physician; Pcp Not In System

## 2016-07-07 NOTE — Progress Notes (Signed)
Post hemodialysis tx

## 2016-07-07 NOTE — Consult Note (Signed)
[facility] Infectious Disease     Reason for Consult: Sepsis    Referring Physician: Seth Bake Date of Admission:  07/05/2016   Active Problems:   Sepsis due to pneumonia [facility])   HPI: NUSRAT ENCARNACION is a 65 y.o. female admitted with fevers, sob, productive cough and weakness and nausea for 3 days. She is a HD patient but reports no problems at HD and no issues with her LUE fistula. She has had no skin or soft tissue infections, recent UTIs, sore throats, HA or other sxs.  Since admission she has had fevers to 103 but most recentl seems to be decreasing to 101. Is on vanco and zosyn.   Past Medical History:  Diagnosis Date  . Asthma   . CAD (coronary artery disease)   . COPD (chronic obstructive pulmonary disease) ([place])   . Depression   . Diabetes mellitus without complication (HCC)    NIDD  . H/O angina pectoris   . H/O blood clots   . Hypercholesteremia   . Hypertension   . Myocardial infarction   . Obesity   . Renal insufficiency   . Stroke ([place])   . Vertigo, aural    Past Surgical History:  Procedure Laterality Date  . AV FISTULA PLACEMENT Left 11-04-2015  . AV FISTULA PLACEMENT Left 11/04/2015   Procedure: ARTERIOVENOUS (AV) FISTULA CREATION  ( BRACHIAL CEPHALIC );  Surgeon: Katha Cabal, MD;  Location: [facility] ORS;  Service: Vascular;  Laterality: Left;  . CARDIAC CATHETERIZATION    . CORONARY ARTERY BYPASS GRAFT  2012  . CORONARY STENT PLACEMENT    . JOINT REPLACEMENT    . KNEE SURGERY Bilateral   . PERIPHERAL VASCULAR CATHETERIZATION N/A 11/13/2015   Procedure: Dialysis/Perma Catheter Insertion;  Surgeon: Katha Cabal, MD;  Location: [place] CV LAB;  Service: Cardiovascular;  Laterality: N/A;  . PERIPHERAL VASCULAR CATHETERIZATION N/A 12/04/2015   Procedure: Dialysis/Perma Catheter Insertion;  Surgeon: Katha Cabal, MD;  Location: [place] CV LAB;  Service: Cardiovascular;  Laterality: N/A;  . PERIPHERAL VASCULAR CATHETERIZATION Left  12/31/2015   Procedure: A/V Shuntogram/Fistulagram;  Surgeon: Algernon Huxley, MD;  Location: [place] CV LAB;  Service: Cardiovascular;  Laterality: Left;  . PERIPHERAL VASCULAR CATHETERIZATION N/A 12/31/2015   Procedure: A/V Shunt Intervention;  Surgeon: Algernon Huxley, MD;  Location: [place] [facility] LAB;  Service: Cardiovascular;  Laterality: N/A;  . PERIPHERAL VASCULAR CATHETERIZATION Left 02/25/2016   Procedure: A/V Shuntogram/Fistulagram;  Surgeon: Algernon Huxley, MD;  Location: [place] [facility] LAB;  Service: Cardiovascular;  Laterality: Left;  . PERIPHERAL VASCULAR CATHETERIZATION N/A 03/18/2016   Procedure: Dialysis/Perma Catheter Removal;  Surgeon: Katha Cabal, MD;  Location: [place] CV LAB;  Service: Cardiovascular;  Laterality: N/A;  . REPLACEMENT TOTAL KNEE BILATERAL Bilateral    Social History  Substance Use Topics  . Smoking status: Current Every Day Smoker    Packs/day: 0.50    Types: Cigarettes  . Smokeless tobacco: Never Used  . Alcohol use No   Family History  Problem Relation Age of Onset  . Cancer Mother   . Cancer Father   . Diabetes Brother   . Heart disease Brother     Allergies:  Allergies  Allergen Reactions  . Nystatin Hives and Itching  . Sulfa Antibiotics Swelling, Hives and Rash  . Niaspan [Niacin] Other (See Comments) and Hives    Current antibiotics: Antibiotics Given (last 72 hours)    Date/Time Action Medication Dose Rate  07/06/16 0055 Given   vancomycin (VANCOCIN) 1,750 mg in sodium chloride 0.9 % 500 mL IVPB 1,750 mg 250 mL/hr   07/06/16 0507 Given   piperacillin-tazobactam (ZOSYN) IVPB 3.375 g 3.375 g 12.5 mL/hr   07/06/16 1713 Given   piperacillin-tazobactam (ZOSYN) IVPB 3.375 g 3.375 g 12.5 mL/hr   07/07/16 0525 Given   piperacillin-tazobactam (ZOSYN) IVPB 3.375 g 3.375 g 12.5 mL/hr   07/07/16 1220 Given   vancomycin (VANCOCIN) IVPB 750 mg/150 ml premix 750 mg 150 mL/hr      MEDICATIONS: . amLODipine  10 mg Oral Daily  .  aspirin  81 mg Oral Daily  . atorvastatin  80 mg Oral Daily  . calcium acetate  1,334 mg Oral TID WC  . calcium carbonate  3 tablet Oral Q breakfast  . cholecalciferol  5,000 Units Oral Daily  . guaiFENesin  600 mg Oral BID   And  . dextromethorphan  30 mg Oral BID  . diclofenac  1 patch Transdermal BID  . doxycycline  100 mg Oral Q12H  . DULoxetine  60 mg Oral Daily  . folic acid  1 mg Oral Daily  . heparin  5,000 Units Subcutaneous Q8H  . insulin aspart  0-20 Units Subcutaneous TID WC  . insulin aspart  0-5 Units Subcutaneous QHS  . metoprolol succinate  100 mg Oral Daily  . mometasone-formoterol  2 puff Inhalation BID  . omega-3 acid ethyl esters  1 g Oral BID  . pantoprazole  40 mg Oral BID  . piperacillin-tazobactam (ZOSYN)  IV  3.375 g Intravenous Q12H  . sodium chloride flush  3 mL Intravenous Q12H  . vancomycin  750 mg Intravenous Q T,Th,Sa-HD    Review of Systems - 11 systems reviewed and negative per HPI   OBJECTIVE: Temp:  [98.4 F (36.9 C)-101.2 F (38.4 C)] 98.4 F (36.9 C) (10/19 1435) Pulse Rate:  [74-94] 78 (10/19 1435) Resp:  [14-29] 23 (10/19 1400) BP: (120-162)/(58-90) 120/63 (10/19 1435) SpO2:  [94 %-100 %] 96 % (10/19 1435) Weight:  [104.5 kg (230 lb 6.1 oz)-106 kg (233 lb 11 oz)] 104.5 kg (230 lb 6.1 oz) (10/19 1400) Physical Exam  Constitutional:  oriented to person, place, and time. appears well-developed and well-nourished. No distress. obese HENT: [place]/AT, PERRLA, no scleral icterus Mouth/Throat: Oropharynx is clear and moist. No oropharyngeal exudate.  Cardiovascular: Normal rate, regular rhythm  Pulmonary/Chest: Crackles bil bases R> L Neck = supple, no nuchal rigidity Abdominal: Soft. Bowel sounds are normal.  exhibits no distension. There is no tenderness.  Lymphadenopathy: no cervical adenopathy. No axillary adenopathy Neurological: alert and oriented to person, place, and time.  Skin: Skin is warm and dry. No rash noted. No erythema.  AVF  site LUE wnl Psychiatric: a normal mood and affect.  behavior is normal.    LABS: Results for orders placed or performed during the hospital encounter of 07/05/16 (from the past 48 hour(s))  Comprehensive metabolic panel     Status: Abnormal   Collection Time: 07/05/16  6:42 PM  Result Value Ref Range   Sodium 135 135 - 145 mmol/L   Potassium 3.4 (L) 3.5 - 5.1 mmol/L   Chloride 95 (L) 101 - 111 mmol/L   CO2 28 22 - 32 mmol/L   Glucose, Bld 104 (H) 65 - 99 mg/dL   BUN 20 6 - 20 mg/dL   Creatinine, Ser 6.62 (H) 0.44 - 1.00 mg/dL   Calcium 8.0 (L) 8.9 - 10.3 mg/dL   Total Protein  7.7 6.5 - 8.1 g/dL   Albumin 3.3 (L) 3.5 - 5.0 g/dL   AST 31 15 - 41 U/L   ALT 13 (L) 14 - 54 U/L   Alkaline Phosphatase 66 38 - 126 U/L   Total Bilirubin 0.8 0.3 - 1.2 mg/dL   GFR calc non Af Amer 11 (L) >60 mL/min   GFR calc Af Amer 13 (L) >60 mL/min    Comment: (NOTE) The eGFR has been calculated using the CKD EPI equation. This calculation has not been validated in all clinical situations. eGFR's persistently <60 mL/min signify possible Chronic Kidney Disease.    Anion gap 12 5 - 15  Lactic acid, plasma     Status: Abnormal   Collection Time: 07/05/16  6:42 PM  Result Value Ref Range   Lactic Acid, Venous 2.6 (HH) 0.5 - 1.9 mmol/L    Comment: CRITICAL RESULT CALLED TO, READ BACK BY AND VERIFIED WITH RACHAEL HAIDEN 07/05/16 @ 1933  MLK   CBC with Differential     Status: Abnormal   Collection Time: 07/05/16  6:42 PM  Result Value Ref Range   WBC 4.5 3.6 - 11.0 K/uL   RBC 3.80 3.80 - 5.20 MIL/uL   Hemoglobin 11.2 (L) 12.0 - 16.0 g/dL   HCT 85.9 (L) 11.7 - 70.1 %   MCV 90.6 80.0 - 100.0 fL   MCH 29.4 26.0 - 34.0 pg   MCHC 32.5 32.0 - 36.0 g/dL   RDW 93.9 (H) 66.5 - 78.1 %   Platelets 134 (L) 150 - 440 K/uL   Neutrophils Relative % 74 %   Lymphocytes Relative 12 %   Monocytes Relative 10 %   Eosinophils Relative 4 %   Basophils Relative 0 %   Neutro Abs 3.3 1.4 - 6.5 K/uL   Lymphs Abs  0.5 (L) 1.0 - 3.6 K/uL   Monocytes Absolute 0.5 0.2 - 0.9 K/uL   Eosinophils Absolute 0.2 0 - 0.7 K/uL   Basophils Absolute 0.0 0 - 0.1 K/uL  Culture, blood (Routine x 2)     Status: None (Preliminary result)   Collection Time: 07/05/16  6:42 PM  Result Value Ref Range   Specimen Description BLOOD RIGHT WRIST    Special Requests BAA5ML BAE5ML    Culture NO GROWTH 2 DAYS    Report Status PENDING   Troponin I     Status: Abnormal   Collection Time: 07/05/16  6:42 PM  Result Value Ref Range   Troponin I 0.03 (HH) <0.03 ng/mL    Comment: CRITICAL RESULT CALLED TO, READ BACK BY AND VERIFIED WITH RACHEL HAYDEN ON 07/05/16 AT 2144 BY TLB   Magnesium     Status: Abnormal   Collection Time: 07/05/16  6:42 PM  Result Value Ref Range   Magnesium 1.4 (L) 1.7 - 2.4 mg/dL  Phosphorus     Status: Abnormal   Collection Time: 07/05/16  6:42 PM  Result Value Ref Range   Phosphorus 1.8 (L) 2.5 - 4.6 mg/dL  Culture, blood (Routine x 2)     Status: None (Preliminary result)   Collection Time: 07/05/16  6:47 PM  Result Value Ref Range   Specimen Description BLOOD RIGHT AC    Special Requests BAA BAE    Culture NO GROWTH 2 DAYS    Report Status PENDING   Urinalysis complete, with microscopic     Status: Abnormal   Collection Time: 07/05/16  8:37 PM  Result Value Ref Range   Color,  Urine YELLOW (A) YELLOW   APPearance HAZY (A) CLEAR   Glucose, UA NEGATIVE NEGATIVE mg/dL   Bilirubin Urine NEGATIVE NEGATIVE   Ketones, ur NEGATIVE NEGATIVE mg/dL   Specific Gravity, Urine 1.012 1.005 - 1.030   Hgb urine dipstick NEGATIVE NEGATIVE   pH 9.0 (H) 5.0 - 8.0   Protein, ur >500 (A) NEGATIVE mg/dL   Nitrite NEGATIVE NEGATIVE   Leukocytes, UA NEGATIVE NEGATIVE   RBC / HPF 0-5 0 - 5 RBC/hpf   WBC, UA 0-5 0 - 5 WBC/hpf   Bacteria, UA MANY (A) NONE SEEN   Squamous Epithelial / LPF 6-30 (A) NONE SEEN  Urine culture     Status: None   Collection Time: 07/05/16  8:37 PM  Result Value Ref Range    Specimen Description URINE, RANDOM    Special Requests NONE    Culture      NO GROWTH 1 DAY Performed at [facility]    Report Status 07/07/2016 FINAL   Lactic acid, plasma     Status: None   Collection Time: 07/05/16  9:56 PM  Result Value Ref Range   Lactic Acid, Venous 0.9 0.5 - 1.9 mmol/L  Glucose, capillary     Status: None   Collection Time: 07/05/16 11:36 PM  Result Value Ref Range   Glucose-Capillary 94 65 - 99 mg/dL  MRSA PCR Screening     Status: None   Collection Time: 07/05/16 11:45 PM  Result Value Ref Range   MRSA by PCR NEGATIVE NEGATIVE    Comment:        The GeneXpert MRSA Assay (FDA approved for NASAL specimens only), is one component of a comprehensive MRSA colonization surveillance program. It is not intended to diagnose MRSA infection nor to guide or monitor treatment for MRSA infections.   Basic metabolic panel     Status: Abnormal   Collection Time: 07/06/16  6:19 AM  Result Value Ref Range   Sodium 133 (L) 135 - 145 mmol/L   Potassium 3.8 3.5 - 5.1 mmol/L   Chloride 97 (L) 101 - 111 mmol/L   CO2 25 22 - 32 mmol/L   Glucose, Bld 95 65 - 99 mg/dL   BUN 25 (H) 6 - 20 mg/dL   Creatinine, Ser 4.92 (H) 0.44 - 1.00 mg/dL   Calcium 7.7 (L) 8.9 - 10.3 mg/dL   GFR calc non Af Amer 8 (L) >60 mL/min   GFR calc Af Amer 10 (L) >60 mL/min    Comment: (NOTE) The eGFR has been calculated using the CKD EPI equation. This calculation has not been validated in all clinical situations. eGFR's persistently <60 mL/min signify possible Chronic Kidney Disease.    Anion gap 11 5 - 15  CBC     Status: Abnormal   Collection Time: 07/06/16  6:19 AM  Result Value Ref Range   WBC 3.5 (L) 3.6 - 11.0 K/uL   RBC 3.50 (L) 3.80 - 5.20 MIL/uL   Hemoglobin 10.4 (L) 12.0 - 16.0 g/dL   HCT 31.6 (L) 35.0 - 47.0 %   MCV 90.2 80.0 - 100.0 fL   MCH 29.8 26.0 - 34.0 pg   MCHC 33.0 32.0 - 36.0 g/dL   RDW 17.9 (H) 11.5 - 14.5 %   Platelets 112 (L) 150 - 440 K/uL   Glucose, capillary     Status: None   Collection Time: 07/06/16  8:41 AM  Result Value Ref Range   Glucose-Capillary 89 65 - 99 mg/dL  Glucose, capillary     Status: None   Collection Time: 07/06/16 11:25 AM  Result Value Ref Range   Glucose-Capillary 77 65 - 99 mg/dL   Comment 1 Notify RN   Culture, sputum-assessment     Status: None   Collection Time: 07/06/16 12:20 PM  Result Value Ref Range   Specimen Description SPUTUM    Special Requests Normal    Sputum evaluation      Sputum specimen not acceptable for testing.  Please recollect.   C/OBETH RICHARDSON RN AT [id number] 07/06/16 MSS    Report Status 07/06/2016 FINAL   Influenza panel by PCR (type A & B, H1N1)     Status: None   Collection Time: 07/06/16 12:21 PM  Result Value Ref Range   Influenza A By PCR NEGATIVE NEGATIVE   Influenza B By PCR NEGATIVE NEGATIVE   H1N1 flu by pcr NOT DETECTED NOT DETECTED    Comment:        The Xpert Flu assay (FDA approved for nasal aspirates or washes and nasopharyngeal swab specimens), is intended as an aid in the diagnosis of influenza and should not be used as a sole basis for treatment.   C difficile quick scan w PCR reflex     Status: None   Collection Time: 07/06/16  1:43 PM  Result Value Ref Range   C Diff antigen NEGATIVE NEGATIVE   C Diff toxin NEGATIVE NEGATIVE   C Diff interpretation No C. difficile detected.   Glucose, capillary     Status: None   Collection Time: 07/06/16  4:40 PM  Result Value Ref Range   Glucose-Capillary 99 65 - 99 mg/dL   Comment 1 Notify RN   Glucose, capillary     Status: None   Collection Time: 07/06/16  9:27 PM  Result Value Ref Range   Glucose-Capillary 81 65 - 99 mg/dL  CBC     Status: Abnormal   Collection Time: 07/07/16  6:14 AM  Result Value Ref Range   WBC 2.6 (L) 3.6 - 11.0 K/uL   RBC 3.82 3.80 - 5.20 MIL/uL   Hemoglobin 11.3 (L) 12.0 - 16.0 g/dL   HCT 16.1 19.6 - 38.1 %   MCV 91.8 80.0 - 100.0 fL   MCH 29.5 26.0 - 34.0 pg   MCHC  32.1 32.0 - 36.0 g/dL   RDW 20.3 (H) 22.5 - 20.1 %   Platelets 113 (L) 150 - 440 K/uL  Glucose, capillary     Status: None   Collection Time: 07/07/16  7:25 AM  Result Value Ref Range   Glucose-Capillary 82 65 - 99 mg/dL   Comment 1 Notify RN   Phosphorus     Status: None   Collection Time: 07/07/16 10:14 AM  Result Value Ref Range   Phosphorus 4.5 2.5 - 4.6 mg/dL  Glucose, capillary     Status: None   Collection Time: 07/07/16  3:21 PM  Result Value Ref Range   Glucose-Capillary 82 65 - 99 mg/dL   No components found for: ESR, C REACTIVE PROTEIN MICRO: Recent Results (from the past 720 hour(s))  Culture, blood (Routine x 2)     Status: None (Preliminary result)   Collection Time: 07/05/16  6:42 PM  Result Value Ref Range Status   Specimen Description BLOOD RIGHT WRIST  Final   Special Requests BAA5ML BAE5ML  Final   Culture NO GROWTH 2 DAYS  Final   Report Status PENDING  Incomplete  Culture, blood (Routine x  2)     Status: None (Preliminary result)   Collection Time: 07/05/16  6:47 PM  Result Value Ref Range Status   Specimen Description BLOOD RIGHT AC  Final   Special Requests BAA 5ML BAE 5ML  Final   Culture NO GROWTH 2 DAYS  Final   Report Status PENDING  Incomplete  Urine culture     Status: None   Collection Time: 07/05/16  8:37 PM  Result Value Ref Range Status   Specimen Description URINE, RANDOM  Final   Special Requests NONE  Final   Culture   Final    NO GROWTH 1 DAY Performed at [facility]    Report Status 07/07/2016 FINAL  Final  MRSA PCR Screening     Status: None   Collection Time: 07/05/16 11:45 PM  Result Value Ref Range Status   MRSA by PCR NEGATIVE NEGATIVE Final    Comment:        The GeneXpert MRSA Assay (FDA approved for NASAL specimens only), is one component of a comprehensive MRSA colonization surveillance program. It is not intended to diagnose MRSA infection nor to guide or monitor treatment for MRSA infections.    Culture, sputum-assessment     Status: None   Collection Time: 07/06/16 12:20 PM  Result Value Ref Range Status   Specimen Description SPUTUM  Final   Special Requests Normal  Final   Sputum evaluation   Final    Sputum specimen not acceptable for testing.  Please recollect.   C/OBETH RICHARDSON RN AT [id number] 07/06/16 MSS    Report Status 07/06/2016 FINAL  Final  C difficile quick scan w PCR reflex     Status: None   Collection Time: 07/06/16  1:43 PM  Result Value Ref Range Status   C Diff antigen NEGATIVE NEGATIVE Final   C Diff toxin NEGATIVE NEGATIVE Final   C Diff interpretation No C. difficile detected.  Final    IMAGING: Dg Chest 2 View  Result Date: 07/05/2016 CLINICAL DATA:  Fever, chills, shortness of breath. EXAM: CHEST  2 VIEW COMPARISON:  Radiographs of December 19, 2015. FINDINGS: Stable cardiomediastinal silhouette. No pneumothorax or significant pleural effusion is noted. Multilevel degenerative disc disease is noted in the thoracic spine. Mildly increased basilar interstitial densities are noted concerning for worsening pulmonary edema. IMPRESSION: Mildly increased bibasilar interstitial densities concerning for worsening edema. Electronically Signed   By: Marijo Conception, M.D.   On: 07/05/2016 18:58   Dg Hip Unilat With Pelvis 2-3 Views Right  Result Date: 07/06/2016 CLINICAL DATA:  Subacute onset of right hip pain. Initial encounter. EXAM: DG HIP (WITH OR WITHOUT PELVIS) 2-3V RIGHT COMPARISON:  None. FINDINGS: There is no evidence of fracture or dislocation. Axial joint space narrowing is noted at the right hip, with mild sclerosis and subcortical cystic change. The proximal right femur appears intact. Mild degenerative change is noted at the sacroiliac joints, pubic symphysis and lower lumbar spine. The left hip joint is grossly unremarkable in appearance. The visualized bowel gas pattern is grossly unremarkable in appearance. Scattered phleboliths are noted within the  pelvis. IMPRESSION: 1. Axial joint space narrowing at the right hip, with mild sclerosis and subcortical cystic change. 2. No evidence of fracture or dislocation. Electronically Signed   By: Garald Balding M.D.   On: 07/06/2016 01:16    Assessment:   MARYBELLA ETHIER is a 65 y.o. female with ESRD on HD admitted with fevers, cough and some abd pain. Has been  febrile, bcx neg, wbc nml, UA neg.  Has some crackles on exam and with sxs I suspect this is likely PNA.  She does however has some abd pain so intraabdominal process possible. AVF site seems nml.   Recommendations Cont Vanco and zosyn  Agree with adding doxy for atypical coverage If fevers persist CT chest abd and pelvis  Thank you very much for allowing me to participate in the care of this patient. Please call with questions.   Cheral Marker. Ola Spurr, MD

## 2016-07-08 LAB — GLUCOSE, CAPILLARY
GLUCOSE-CAPILLARY: 72 mg/dL (ref 65–99)
GLUCOSE-CAPILLARY: 112 mg/dL — AB (ref 65–99)
GLUCOSE-CAPILLARY: 103 mg/dL — AB (ref 65–99)
Glucose-Capillary: 108 mg/dL — ABNORMAL HIGH (ref 65–99)
Glucose-Capillary: 84 mg/dL (ref 65–99)

## 2016-07-08 LAB — CBC
HCT: 34.6 % — ABNORMAL LOW (ref 35.0–47.0)
Hemoglobin: 11.3 g/dL — ABNORMAL LOW (ref 12.0–16.0)
MCH: 29.8 pg (ref 26.0–34.0)
MCHC: 32.6 g/dL (ref 32.0–36.0)
MCV: 91.5 fL (ref 80.0–100.0)
PLATELETS: 98 10*3/uL — AB (ref 150–440)
RBC: 3.78 MIL/uL — AB (ref 3.80–5.20)
RDW: 17.9 % — ABNORMAL HIGH (ref 11.5–14.5)
WBC: 2.4 10*3/uL — AB (ref 3.6–11.0)

## 2016-07-08 LAB — EHRLICHIA ANTIBODY PANEL
E chaffeensis (HGE) Ab, IgG: NEGATIVE
E chaffeensis (HGE) Ab, IgM: NEGATIVE
E. CHAFFEENSIS (HME) IGM TITER: NEGATIVE
E. CHAFFEENSIS IGG AB: NEGATIVE

## 2016-07-08 LAB — ROCKY MTN SPOTTED FVR ABS PNL(IGG+IGM)
RMSF IgG: NEGATIVE
RMSF IgM: 0.19 index (ref 0.00–0.89)

## 2016-07-08 LAB — PARATHYROID HORMONE, INTACT (NO CA): PTH: 186 pg/mL — AB (ref 15–65)

## 2016-07-08 LAB — B. BURGDORFI ANTIBODIES: B burgdorferi Ab IgG+IgM: <0.91 {ISR} (ref 0.00–0.90)

## 2016-07-08 MED ORDER — MORPHINE SULFATE (PF) 2 MG/ML IV SOLN
2.0000 mg | Freq: Once | INTRAVENOUS | Status: AC
  Administered 2016-07-08: 2 mg via INTRAVENOUS
  Filled 2016-07-08: qty 1

## 2016-07-08 NOTE — Progress Notes (Signed)
[place] INFECTIOUS DISEASE PROGRESS NOTE Date of Admission:  07/05/2016     ID: Kimberly Shaffer is a 65 y.o. female with fever, cough  Active Problems:   Sepsis due to pneumonia [facility])   Subjective: No fevers since 10/18. No abd pain. Feels stronger.   ROS  Eleven systems are reviewed and negative except per hpi  Medications:  Antibiotics Given (last 72 hours)    Date/Time Action Medication Dose Rate   07/06/16 0055 Given   vancomycin (VANCOCIN) 1,750 mg in sodium chloride 0.9 % 500 mL IVPB 1,750 mg 250 mL/hr   07/06/16 0507 Given   piperacillin-tazobactam (ZOSYN) IVPB 3.375 g 3.375 g 12.5 mL/hr   07/06/16 1713 Given   piperacillin-tazobactam (ZOSYN) IVPB 3.375 g 3.375 g 12.5 mL/hr   07/07/16 0525 Given   piperacillin-tazobactam (ZOSYN) IVPB 3.375 g 3.375 g 12.5 mL/hr   07/07/16 1220 Given   vancomycin (VANCOCIN) IVPB 750 mg/150 ml premix 750 mg 150 mL/hr   07/07/16 1751 Given   piperacillin-tazobactam (ZOSYN) IVPB 3.375 g 3.375 g 12.5 mL/hr   07/07/16 2322 Given   doxycycline (VIBRA-TABS) tablet 100 mg 100 mg    07/08/16 6295 Given   piperacillin-tazobactam (ZOSYN) IVPB 3.375 g 3.375 g 12.5 mL/hr   07/08/16 1144 Given   doxycycline (VIBRA-TABS) tablet 100 mg 100 mg      . amLODipine  10 mg Oral Daily  . aspirin  81 mg Oral Daily  . atorvastatin  80 mg Oral Daily  . calcium acetate  1,334 mg Oral TID WC  . calcium carbonate  3 tablet Oral Q breakfast  . cholecalciferol  5,000 Units Oral Daily  . guaiFENesin  600 mg Oral BID   And  . dextromethorphan  30 mg Oral BID  . diclofenac  1 patch Transdermal BID  . doxycycline  100 mg Oral Q12H  . DULoxetine  60 mg Oral Daily  . folic acid  1 mg Oral Daily  . heparin  5,000 Units Subcutaneous Q8H  . insulin aspart  0-20 Units Subcutaneous TID WC  . insulin aspart  0-5 Units Subcutaneous QHS  . metoprolol succinate  100 mg Oral Daily  . mometasone-formoterol  2 puff Inhalation BID  . omega-3 acid ethyl esters  1 g  Oral BID  . pantoprazole  40 mg Oral BID  . sodium chloride flush  3 mL Intravenous Q12H    Objective: Vital signs in last 24 hours: Temp:  [98.1 F (36.7 C)-99.8 F (37.7 C)] 98.2 F (36.8 C) (10/20 1348) Pulse Rate:  [88-106] 88 (10/20 1348) Resp:  [18-20] 18 (10/20 1348) BP: (115-144)/(58-81) 118/58 (10/20 1348) SpO2:  [94 %-100 %] 99 % (10/20 1348) Constitutional:  oriented to person, place, and time. appears well-developed and well-nourished. No distress. obese HENT: [place]/AT, PERRLA, no scleral icterus Mouth/Throat: Oropharynx is clear and moist. No oropharyngeal exudate.  Cardiovascular: Normal rate, regular rhythm  Pulmonary/Chest: Crackles bil bases R> L Neck = supple, no nuchal rigidity Abdominal: Soft. Bowel sounds are normal.  exhibits no distension. There is no tenderness.  Lymphadenopathy: no cervical adenopathy. No axillary adenopathy Neurological: alert and oriented to person, place, and time.  Skin: Skin is warm and dry. No rash noted. No erythema.  AVF site LUE wnl Psychiatric: a normal mood and affect.  behavior is normal.   Lab Results  Recent Labs  07/05/16 1842 07/06/16 0619 07/07/16 0614 07/08/16 0411  WBC 4.5 3.5* 2.6* 2.4*  HGB 11.2* 10.4* 11.3* 11.3*  HCT 34.4*  31.6* 35.1 34.6*  NA 135 133*  --   --   K 3.4* 3.8  --   --   CL 95* 97*  --   --   CO2 28 25  --   --   BUN 20 25*  --   --   CREATININE 3.88* 4.92*  --   --     Microbiology: Results for orders placed or performed during the hospital encounter of 07/05/16  Culture, blood (Routine x 2)     Status: None (Preliminary result)   Collection Time: 07/05/16  6:42 PM  Result Value Ref Range Status   Specimen Description BLOOD RIGHT WRIST  Final   Special Requests BAA5ML BAE5ML  Final   Culture NO GROWTH 3 DAYS  Final   Report Status PENDING  Incomplete  Culture, blood (Routine x 2)     Status: None (Preliminary result)   Collection Time: 07/05/16  6:47 PM  Result Value Ref Range Status    Specimen Description BLOOD RIGHT AC  Final   Special Requests BAA 5ML BAE 5ML  Final   Culture NO GROWTH 3 DAYS  Final   Report Status PENDING  Incomplete  Urine culture     Status: None   Collection Time: 07/05/16  8:37 PM  Result Value Ref Range Status   Specimen Description URINE, RANDOM  Final   Special Requests NONE  Final   Culture   Final    NO GROWTH 1 DAY Performed at [facility]    Report Status 07/07/2016 FINAL  Final  MRSA PCR Screening     Status: None   Collection Time: 07/05/16 11:45 PM  Result Value Ref Range Status   MRSA by PCR NEGATIVE NEGATIVE Final    Comment:        The GeneXpert MRSA Assay (FDA approved for NASAL specimens only), is one component of a comprehensive MRSA colonization surveillance program. It is not intended to diagnose MRSA infection nor to guide or monitor treatment for MRSA infections.   Culture, sputum-assessment     Status: None   Collection Time: 07/06/16 12:20 PM  Result Value Ref Range Status   Specimen Description SPUTUM  Final   Special Requests Normal  Final   Sputum evaluation   Final    Sputum specimen not acceptable for testing.  Please recollect.   C/OBETH RICHARDSON RN AT [id number] 07/06/16 MSS    Report Status 07/06/2016 FINAL  Final  C difficile quick scan w PCR reflex     Status: None   Collection Time: 07/06/16  1:43 PM  Result Value Ref Range Status   C Diff antigen NEGATIVE NEGATIVE Final   C Diff toxin NEGATIVE NEGATIVE Final   C Diff interpretation No C. difficile detected.  Final    Studies/Results: Dg Chest 2 View  Result Date: 07/07/2016 CLINICAL DATA:  Fever, productive cough, history of asthma, smoking history EXAM: CHEST  2 VIEW COMPARISON:  Chest x-ray of 07/05/2016 FINDINGS: No pneumonia or effusion is seen. However there are still somewhat prominent interstitial markings with slight fullness of the perihilar vasculature which may indicate mild edema. Cardiomegaly is stable. No bony  abnormality is seen. IMPRESSION: Stable cardiomegaly.  Still suspect mild edema. Electronically Signed   By: Ivar Drape M.D.   On: 07/07/2016 16:58    Assessment/Plan: Kimberly Shaffer is a 65 y.o. female with ESRD on HD admitted with fevers, cough and some abd pain. Has been febrile, bcx neg, wbc nml,  UA neg.  Has some crackles on exam and with sxs I suspect this is likely PNA.  She does however has some abd pain so intraabdominal process possible. AVF site seems nml. Flu PCR neg Does have mild pancytopenia  Recommendations If continues to improve can dc on oral augmentin and oral doxy for a 7 total day abx course If fevers recur CT chest abd and pelvis  Thank you very much for the consult. Will follow with you.  Milton, Atharv Barriere P   07/08/2016, 3:17 PM

## 2016-07-08 NOTE — Care Management Important Message (Signed)
Important Message  Patient Details  Name: Kimberly Shaffer MRN: [id number] Date of Birth: [birth year]   Medicare Important Message Given:  Yes    Beverly Sessions, RN 07/08/2016, 3:23 PM

## 2016-07-08 NOTE — Progress Notes (Signed)
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 Denies acute c/o Multiple snacks in her room Patient eating [employer] food - [employer] lunch box noted    Objective:  Vital signs in last 24 hours:  Temp:  [98.1 F (36.7 C)-99.8 F (37.7 C)] 98.2 F (36.8 C) (10/20 1348) Pulse Rate:  [88-106] 88 (10/20 1348) Resp:  [18-20] 18 (10/20 1348) BP: (115-144)/(58-81) 118/58 (10/20 1348) SpO2:  [94 %-100 %] 99 % (10/20 1348)  Weight change:  Filed Weights   07/05/16 1932 07/07/16 0940 07/07/16 1400  Weight: 103.4 kg (228 lb) 106 kg (233 lb 11 oz) 104.5 kg (230 lb 6.1 oz)    Intake/Output: I/O last 3 completed shifts: In: 1293 [P.O.:1200; I.V.:3; IV Piggyback:90] Out: 1500 [Other:1500]   Intake/Output this shift:  No intake/output data recorded.  Physical Exam: General: No acute distress  Head: Normocephalic, atraumatic. Moist oral mucosal membranes  Eyes: Anicteric  Neck: Supple, trachea midline  Lungs:  Basilar rales, normal effort  Heart: S1S2 no rubs  Abdomen:  Soft, nontender, bowel sounds present  Extremities: no peripheral edema.  Neurologic: Nonfocal, moving all four extremities  Skin: No lesions  Access: Left upper extremity access    Basic Metabolic Panel:  Recent Labs Lab 07/05/16 1842 07/06/16 0619 07/07/16 1014  NA 135 133*  --   K 3.4* 3.8  --   CL 95* 97*  --   CO2 28 25  --   GLUCOSE 104* 95  --   BUN 20 25*  --   CREATININE 3.88* 4.92*  --   CALCIUM 8.0* 7.7*  --   MG 1.4*  --   --   PHOS 1.8*  --  4.5    Liver Function Tests:  Recent Labs Lab 07/05/16 1842  AST 31  ALT 13*  ALKPHOS 66  BILITOT 0.8  PROT 7.7  ALBUMIN 3.3*   No results for input(s): LIPASE, AMYLASE in the last 168 hours. No results for input(s): AMMONIA in the last 168 hours.  CBC:  Recent Labs Lab 07/05/16 1842 07/06/16 0619 07/07/16 0614 07/08/16 0411  WBC 4.5 3.5* 2.6* 2.4*  NEUTROABS 3.3  --   --   --   HGB 11.2* 10.4* 11.3*  11.3*  HCT 34.4* 31.6* 35.1 34.6*  MCV 90.6 90.2 91.8 91.5  PLT 134* 112* 113* 98*    Cardiac Enzymes:  Recent Labs Lab 07/05/16 1842  TROPONINI 0.03*    BNP: Invalid input(s): POCBNP  CBG:  Recent Labs Lab 07/07/16 2148 07/08/16 0738 07/08/16 0950 07/08/16 1146 07/08/16 1659  GLUCAP 88 72 108* 112* 103*    Microbiology: Results for orders placed or performed during the hospital encounter of 07/05/16  Culture, blood (Routine x 2)     Status: None (Preliminary result)   Collection Time: 07/05/16  6:42 PM  Result Value Ref Range Status   Specimen Description BLOOD RIGHT WRIST  Final   Special Requests BAA5ML BAE5ML  Final   Culture NO GROWTH 3 DAYS  Final   Report Status PENDING  Incomplete  Culture, blood (Routine x 2)     Status: None (Preliminary result)   Collection Time: 07/05/16  6:47 PM  Result Value Ref Range Status   Specimen Description BLOOD RIGHT AC  Final   Special Requests BAA 5ML BAE 5ML  Final   Culture NO GROWTH 3 DAYS  Final   Report Status PENDING  Incomplete  Urine culture     Status: None  Collection Time: 07/05/16  8:37 PM  Result Value Ref Range Status   Specimen Description URINE, RANDOM  Final   Special Requests NONE  Final   Culture   Final    NO GROWTH 1 DAY Performed at [facility]    Report Status 07/07/2016 FINAL  Final  MRSA PCR Screening     Status: None   Collection Time: 07/05/16 11:45 PM  Result Value Ref Range Status   MRSA by PCR NEGATIVE NEGATIVE Final    Comment:        The GeneXpert MRSA Assay (FDA approved for NASAL specimens only), is one component of a comprehensive MRSA colonization surveillance program. It is not intended to diagnose MRSA infection nor to guide or monitor treatment for MRSA infections.   Culture, sputum-assessment     Status: None   Collection Time: 07/06/16 12:20 PM  Result Value Ref Range Status   Specimen Description SPUTUM  Final   Special Requests Normal  Final   Sputum  evaluation   Final    Sputum specimen not acceptable for testing.  Please recollect.   C/OBETH RICHARDSON RN AT [id number] 07/06/16 MSS    Report Status 07/06/2016 FINAL  Final  C difficile quick scan w PCR reflex     Status: None   Collection Time: 07/06/16  1:43 PM  Result Value Ref Range Status   C Diff antigen NEGATIVE NEGATIVE Final   C Diff toxin NEGATIVE NEGATIVE Final   C Diff interpretation No C. difficile detected.  Final    Coagulation Studies: No results for input(s): LABPROT, INR in the last 72 hours.  Urinalysis:  Recent Labs  07/05/16 2037  COLORURINE YELLOW*  LABSPEC 1.012  PHURINE 9.0*  GLUCOSEU NEGATIVE  HGBUR NEGATIVE  BILIRUBINUR NEGATIVE  KETONESUR NEGATIVE  PROTEINUR >500*  NITRITE NEGATIVE  LEUKOCYTESUR NEGATIVE      Imaging: Dg Chest 2 View  Result Date: 07/07/2016 CLINICAL DATA:  Fever, productive cough, history of asthma, smoking history EXAM: CHEST  2 VIEW COMPARISON:  Chest x-ray of 07/05/2016 FINDINGS: No pneumonia or effusion is seen. However there are still somewhat prominent interstitial markings with slight fullness of the perihilar vasculature which may indicate mild edema. Cardiomegaly is stable. No bony abnormality is seen. IMPRESSION: Stable cardiomegaly.  Still suspect mild edema. Electronically Signed   By: Ivar Drape M.D.   On: 07/07/2016 16:58     Medications:     . amLODipine  10 mg Oral Daily  . aspirin  81 mg Oral Daily  . atorvastatin  80 mg Oral Daily  . calcium acetate  1,334 mg Oral TID WC  . calcium carbonate  3 tablet Oral Q breakfast  . cholecalciferol  5,000 Units Oral Daily  . guaiFENesin  600 mg Oral BID   And  . dextromethorphan  30 mg Oral BID  . diclofenac  1 patch Transdermal BID  . doxycycline  100 mg Oral Q12H  . DULoxetine  60 mg Oral Daily  . folic acid  1 mg Oral Daily  . heparin  5,000 Units Subcutaneous Q8H  . insulin aspart  0-20 Units Subcutaneous TID WC  . insulin aspart  0-5 Units Subcutaneous  QHS  . metoprolol succinate  100 mg Oral Daily  . mometasone-formoterol  2 puff Inhalation BID  . omega-3 acid ethyl esters  1 g Oral BID  . pantoprazole  40 mg Oral BID  . sodium chloride flush  3 mL Intravenous Q12H   acetaminophen **OR**  acetaminophen, benzonatate, bisacodyl, HYDROcodone-acetaminophen, ibuprofen, ipratropium-albuterol, lubiprostone, magnesium citrate, meclizine, methocarbamol, nitroGLYCERIN, ondansetron **OR** ondansetron (ZOFRAN) IV, oxyCODONE, senna-docusate, zolpidem  Assessment/ Plan:  65 y.o. female with hypertension, coronary artery disease s/p CABG, peripheral vascular disease, diabetes mellitus type II, hyperlipidemia, bilateral total knee replacement, ESRD, anemia of CKD, SHPTH who presents now with fever and infiltrates on CXR.  1.  ESRD on HD MWF [facility].   -  Will arrange for HD tomorrow  2.  Fever/suspected bacterial pneumonia: Influenza screen negative and blood cultures remain negative. . Empiric Abx  Doxy as per hospitalist/ ID team  3.  Anemia of CKD: Hemoglobin up to 11.3. Hold off on Epogen.   4.  SHPTH:  Phosphorous well controlled at 4.5, continue calcium acetate 2 tabs po tid/wm.   LOS: 3 Felicia Both 10/20/20175:48 PM

## 2016-07-08 NOTE — Progress Notes (Signed)
Called Dr. Jannifer Franklin regarding pain in finger and great toe per patient request.  Appropriate orders were placed.  Phoebe Sharps N  07/08/2016 12:22 AM

## 2016-07-08 NOTE — Progress Notes (Signed)
[facility] [place] NAME: Kimberly Shaffer    MR#:  [id number]  DATE OF BIRTH:  1951-01-30  SUBJECTIVE:  CHIEF COMPLAINT:   Chief Complaint  Patient presents with  . Fever  . Shortness of Breath   The patient is a 65 year old African-American female with physical history significant history of end-stage renal disease, hypertension, COPD, diabetes mellitus, coronary artery disease, who presents to the hospital with complaints of fever, cough, sputum production. Chest x-ray was concerning for CHF versus pneumonia, patient was initiated on broad-spectrum antibiotic therapy, His x-ray showed no pneumonia. Patient's  blood negative, sputum cultures are normal flora. Patient feels comfortable today. She is afebrile. She is initiated on doxycycline as a to cover atypical flora     Review of Systems  Constitutional: Negative for chills, fever and weight loss.  HENT: Negative for congestion.   Eyes: Negative for blurred vision and double vision.  Respiratory: Positive for cough, sputum production and shortness of breath. Negative for wheezing.   Cardiovascular: Negative for chest pain, palpitations, orthopnea, leg swelling and PND.  Gastrointestinal: Positive for diarrhea. Negative for abdominal pain, blood in stool, constipation, nausea and vomiting.  Genitourinary: Negative for dysuria, frequency, hematuria and urgency.  Musculoskeletal: Negative for falls.  Neurological: Negative for dizziness, tremors, focal weakness and headaches.  Endo/Heme/Allergies: Does not bruise/bleed easily.  Psychiatric/Behavioral: Negative for depression. The patient does not have insomnia.     VITAL SIGNS: Blood pressure 120/65, pulse 91, temperature 98.1 F (36.7 C), resp. rate 18, height 5\' 6"  (1.676 m), weight 104.5 kg (230 lb 6.1 oz), SpO2 100 %.  PHYSICAL EXAMINATION:   GENERAL:  65 y.o.-year-old patient lying in the bed with no acute distress. Alert,  comfortable, has myoclonus in upper extremities, difficulty holding A cup  EYES: Pupils equal, round, reactive to light and accommodation. No scleral icterus. Extraocular muscles intact.  HEENT: Head atraumatic, normocephalic. Oropharynx and nasopharynx clear.  NECK:  Supple, no jugular venous distention. No thyroid enlargement, no tenderness.  LUNGS: Normal breath sounds bilaterally, no wheezing,  rales,rhonchi or crepitations noted today on auscultation. No use of accessory muscles of respiration. Few crackles at bases CARDIOVASCULAR: S1, S2 normal. No murmurs, rubs, or gallops.  ABDOMEN: Soft, nontender, nondistended. Bowel sounds present. No organomegaly or mass.  EXTREMITIES: No pedal edema, cyanosis, or clubbing.  NEUROLOGIC: Cranial nerves II through XII are intact. Muscle strength 5/5 in all extremities. Sensation intact. Gait not checked.  PSYCHIATRIC: The patient is alert, oriented 3  SKIN: No obvious rash, lesion, or ulcer.   ORDERS/RESULTS REVIEWED:   CBC  Recent Labs Lab 07/05/16 1842 07/06/16 0619 07/07/16 0614 07/08/16 0411  WBC 4.5 3.5* 2.6* 2.4*  HGB 11.2* 10.4* 11.3* 11.3*  HCT 34.4* 31.6* 35.1 34.6*  PLT 134* 112* 113* 98*  MCV 90.6 90.2 91.8 91.5  MCH 29.4 29.8 29.5 29.8  MCHC 32.5 33.0 32.1 32.6  RDW 18.0* 17.9* 18.0* 17.9*  LYMPHSABS 0.5*  --   --   --   MONOABS 0.5  --   --   --   EOSABS 0.2  --   --   --   BASOSABS 0.0  --   --   --    ------------------------------------------------------------------------------------------------------------------  Chemistries   Recent Labs Lab 07/05/16 1842 07/06/16 0619  NA 135 133*  K 3.4* 3.8  CL 95* 97*  CO2 28 25  GLUCOSE 104* 95  BUN 20 25*  CREATININE 3.88* 4.92*  CALCIUM  8.0* 7.7*  MG 1.4*  --   AST 31  --   ALT 13*  --   ALKPHOS 66  --   BILITOT 0.8  --    ------------------------------------------------------------------------------------------------------------------ estimated creatinine  clearance is 13.9 mL/min (by C-G formula based on SCr of 4.92 mg/dL (H)). ------------------------------------------------------------------------------------------------------------------ No results for input(s): TSH, T4TOTAL, T3FREE, THYROIDAB in the last 72 hours.  Invalid input(s): FREET3  Cardiac Enzymes  Recent Labs Lab 07/05/16 1842  TROPONINI 0.03*   ------------------------------------------------------------------------------------------------------------------ Invalid input(s): POCBNP ---------------------------------------------------------------------------------------------------------------  RADIOLOGY: Dg Chest 2 View  Result Date: 07/07/2016 CLINICAL DATA:  Fever, productive cough, history of asthma, smoking history EXAM: CHEST  2 VIEW COMPARISON:  Chest x-ray of 07/05/2016 FINDINGS: No pneumonia or effusion is seen. However there are still somewhat prominent interstitial markings with slight fullness of the perihilar vasculature which may indicate mild edema. Cardiomegaly is stable. No bony abnormality is seen. IMPRESSION: Stable cardiomegaly.  Still suspect mild edema. Electronically Signed   By: Ivar Drape M.D.   On: 07/07/2016 16:58    EKG:  Orders placed or performed during the hospital encounter of 07/05/16  . EKG 12-Lead  . EKG 12-Lead    ASSESSMENT AND PLAN:  Active Problems:   Sepsis due to pneumonia ([place]) #1. Sepsis  due to acute bronchitis, no pneumonia on repeat the chest x-ray, continue doxycycline, blood, sputum cultures were unremarkable, influenza testing was negative. urine culture was negative. Get C. difficile test was negative.  #2  . Acute bronchitis, sputum cultures revealed normal flora, continue doxycycline orally  #3. Diarrhea, ?related due to antibiotics, C. difficile testing is negative. Diarrhea has subsided #4. End-stage renal disease, dialysis per schedule, appreciate nephrology's input, a shin has myoclonus today, seemed to be  worsening from before, questionable uremic symptoms.  #5 Pancytopenia, worsening CBC, ?infection related, continue doxycycline, awaiting for RMSF, ehrichia, Lyme serology   Management plans discussed with the patient, family and they are in agreement.   DRUG ALLERGIES:  Allergies  Allergen Reactions  . Nystatin Hives and Itching  . Sulfa Antibiotics Swelling, Hives and Rash  . Niaspan [Niacin] Other (See Comments) and Hives    CODE STATUS:     Code Status Orders        Start     Ordered   07/05/16 2237  Full code  Continuous     07/05/16 2236    Code Status History    Date Active Date Inactive Code Status Order ID Comments User Context   12/17/2015  8:12 AM 12/20/2015  7:57 PM Full Code [id number]  Saundra Shelling, MD Inpatient      TOTAL TIME TAKING CARE OF THIS PATIENT: 40 minutes.  Discussed with Dr. Rance Muir M.D on 07/08/2016 at 12:53 PM  Between 7am to 6pm - Pager - [phone]  After 6pm go to [email or web address] - password [email or web address] [facility]  Office  [phone]  CC: Primary care physician; Pcp Not In System

## 2016-07-08 NOTE — Progress Notes (Signed)
Per MD okay to leave IV out for now.

## 2016-07-08 NOTE — Evaluation (Signed)
Physical Therapy Evaluation Patient Details Name: Kimberly Shaffer MRN: [id number] DOB: 1950-11-23 Today's Date: 07/08/2016   History of Present Illness   65 y.o. female with a history of End-stage renal disease on dialysis, hypertension, COPD, diabetes and MI presents to the emergency department complaining of nausea, generalized weakness and fever.  She developed a fever associated with shortness of breath, cough productive of yellow sputum, fever to 103 and weakness.  She was admitted with sepsis secondary to pneumonia.  Clinical Impression  Pt is able to ambulate fully around the nurses station and did well with and w/o AD. She had some minimal fatigue with the effort, but ultimately was on room air with O2 staying in the mid/high 90s.  Pt reports history of vertigo and appeared to be positive for R sided BBPV, Eply Maneuver performed X 2 and pt appeared to follow typical pattern with reasonable results.  She would benefit from outpatient PT with vestibular focus to further address associated issues.     Follow Up Recommendations Outpatient PT (vestibular)    Equipment Recommendations       Recommendations for Other Services       Precautions / Restrictions Restrictions Weight Bearing Restrictions: No      Mobility  Bed Mobility Overal bed mobility: Independent             General bed mobility comments: Pt able to get in/out of bed w/o direct assist  Transfers Overall transfer level: Independent Equipment used: None             General transfer comment: Pt able to rise and maintain balance w/o RW or direct assist  Ambulation/Gait Ambulation/Gait assistance: Supervision Ambulation Distance (Feet): 200 Feet Assistive device: Rolling walker (2 wheeled);None       General Gait Details: ~100 ft with RW, and ~100 ft w/o AD.  Pt did well and did not have any overt LOBs or safety issues with or w/o an AD.  Overall she showed good confidence and consistent (minimally  decreased) speed.   Stairs            Wheelchair Mobility    Modified Rankin (Stroke Patients Only)       Balance Overall balance assessment: Modified Independent                                           Pertinent Vitals/Pain      Home Living Family/patient expects to be discharged to:: Private residence Living Arrangements: Children (daughter works night shift and is sleeping much of the day) Available Help at Discharge: Family   Home Access: Stairs to enter Entrance Stairs-Rails: Can reach both Entrance Stairs-Number of Steps: 3   Home Equipment: Environmental consultant - 2 wheels      Prior Function Level of Independence: Independent         Comments: Pt reports she is able to get out when she wants, she has had a few falls in the last 6 months     Hand Dominance        Extremity/Trunk Assessment   Upper Extremity Assessment: Overall WFL for tasks assessed           Lower Extremity Assessment: Overall WFL for tasks assessed         Communication   Communication: No difficulties  Cognition Arousal/Alertness: Awake/alert Behavior During Therapy: WFL for tasks assessed/performed Overall Cognitive Status:  Within Functional Limits for tasks assessed                      General Comments General comments (skin integrity, edema, etc.): Performed R sided Dix-Hallpike with (+) nystagmus response.  proceeded to do Eply Maneuver X 2.  Pt appeared to have good results.    Exercises     Assessment/Plan    PT Assessment Patient needs continued PT services  PT Problem List            PT Treatment Interventions Gait training;Other (comment);Balance training;Therapeutic exercise;Therapeutic activities;Stair training (vestibular )    PT Goals (Current goals can be found in the Care Plan section)  Acute Rehab PT Goals Patient Stated Goal: Go home PT Goal Formulation: With patient/family Time For Goal Achievement: 07/22/16 Potential  to Achieve Goals: Fair    Frequency Min 2X/week   Barriers to discharge        Co-evaluation               End of Session Equipment Utilized During Treatment: Gait belt;Oxygen (Pt's O2 in the high 90s on room air before/after ambulation) Activity Tolerance: Patient limited by fatigue Patient left: with chair alarm set;with call bell/phone within reach           Time: 1101-1140 PT Time Calculation (min) (ACUTE ONLY): 39 min   Charges:   PT Evaluation $PT Eval Moderate Complexity: 1 Procedure PT Treatments $Canalith Rep Proc: 8-22 mins   PT G Codes:        Kreg Shropshire, DPT 07/08/2016, 12:58 PM

## 2016-07-09 LAB — CBC
HEMATOCRIT: 32.7 % — AB (ref 35.0–47.0)
HEMATOCRIT: 31.8 % — AB (ref 35.0–47.0)
HEMOGLOBIN: 10.8 g/dL — AB (ref 12.0–16.0)
HEMOGLOBIN: 10.4 g/dL — AB (ref 12.0–16.0)
MCH: 29.6 pg (ref 26.0–34.0)
MCH: 29.4 pg (ref 26.0–34.0)
MCHC: 32.9 g/dL (ref 32.0–36.0)
MCHC: 32.8 g/dL (ref 32.0–36.0)
MCV: 90.1 fL (ref 80.0–100.0)
MCV: 89.5 fL (ref 80.0–100.0)
PLATELETS: 90 10*3/uL — AB (ref 150–440)
Platelets: 101 10*3/uL — ABNORMAL LOW (ref 150–440)
RBC: 3.63 MIL/uL — AB (ref 3.80–5.20)
RBC: 3.55 MIL/uL — AB (ref 3.80–5.20)
RDW: 18 % — ABNORMAL HIGH (ref 11.5–14.5)
RDW: 17.8 % — ABNORMAL HIGH (ref 11.5–14.5)
WBC: 2.4 10*3/uL — ABNORMAL LOW (ref 3.6–11.0)
WBC: 2.9 10*3/uL — ABNORMAL LOW (ref 3.6–11.0)

## 2016-07-09 LAB — RENAL FUNCTION PANEL
ANION GAP: 11 (ref 5–15)
Albumin: 2.7 g/dL — ABNORMAL LOW (ref 3.5–5.0)
BUN: 44 mg/dL — ABNORMAL HIGH (ref 6–20)
CHLORIDE: 94 mmol/L — AB (ref 101–111)
CO2: 27 mmol/L (ref 22–32)
Calcium: 7.6 mg/dL — ABNORMAL LOW (ref 8.9–10.3)
Creatinine, Ser: 7.68 mg/dL — ABNORMAL HIGH (ref 0.44–1.00)
GFR calc non Af Amer: 5 mL/min — ABNORMAL LOW (ref >60)
GFR, EST AFRICAN AMERICAN: 6 mL/min — AB (ref >60)
GLUCOSE: 93 mg/dL (ref 65–99)
Phosphorus: 5.4 mg/dL — ABNORMAL HIGH (ref 2.5–4.6)
Potassium: 4 mmol/L (ref 3.5–5.1)
Sodium: 132 mmol/L — ABNORMAL LOW (ref 135–145)

## 2016-07-09 LAB — GLUCOSE, CAPILLARY
GLUCOSE-CAPILLARY: 59 mg/dL — AB (ref 65–99)
Glucose-Capillary: 62 mg/dL — ABNORMAL LOW (ref 65–99)
Glucose-Capillary: 128 mg/dL — ABNORMAL HIGH (ref 65–99)

## 2016-07-09 MED ORDER — AMOXICILLIN-POT CLAVULANATE 875-125 MG PO TABS: 1.0000 | ORAL_TABLET | Freq: Two times a day (BID) | ORAL | 0 refills | Status: AC

## 2016-07-09 MED ORDER — DOXYCYCLINE HYCLATE 100 MG PO TABS: 100.0000 mg | ORAL_TABLET | Freq: Two times a day (BID) | ORAL | Status: DC

## 2016-07-09 MED ORDER — DOXYCYCLINE MONOHYDRATE 100 MG PO CAPS: 100.0000 mg | ORAL_CAPSULE | Freq: Two times a day (BID) | ORAL | 0 refills | Status: AC

## 2016-07-09 NOTE — Progress Notes (Signed)
Post hd assessment 

## 2016-07-09 NOTE — Progress Notes (Signed)
Pre hd assessment  

## 2016-07-09 NOTE — Progress Notes (Signed)
[facility]  ROUNDING NOTE   Subjective:  Patient seen during dialysis Tolerating well    HEMODIALYSIS FLOWSHEET:  Blood Flow Rate (mL/min): 400 mL/min Arterial Pressure (mmHg): -160 mmHg Venous Pressure (mmHg): 210 mmHg Transmembrane Pressure (mmHg): 40 mmHg Ultrafiltration Rate (mL/min): 710 mL/min Dialysate Flow Rate (mL/min): 800 ml/min Conductivity: Machine : 13.5 Conductivity: Machine : 13.5 Dialysis Fluid Bolus: Normal Saline Bolus Amount (mL): 250 mL Dialysate Change:  (3k) Intra-Hemodialysis Comments: 2295. pt alert, no c/o, vss.       Objective:  Vital signs in last 24 hours:  Temp:  [98.2 F (36.8 C)-99 F (37.2 C)] 99 F (37.2 C) (10/21 0940) Pulse Rate:  [65-88] 76 (10/21 1300) Resp:  [16-23] 23 (10/21 1300) BP: (109-133)/(53-85) 109/67 (10/21 1300) SpO2:  [90 %-100 %] 97 % (10/21 1300) Weight:  [105.9 kg (233 lb 7.5 oz)] 105.9 kg (233 lb 7.5 oz) (10/21 0940)  Weight change:  Filed Weights   07/07/16 0940 07/07/16 1400 07/09/16 0940  Weight: 106 kg (233 lb 11 oz) 104.5 kg (230 lb 6.1 oz) 105.9 kg (233 lb 7.5 oz)    Intake/Output: I/O last 3 completed shifts: In: 290 [P.O.:240; IV Piggyback:50] Out: 0    Intake/Output this shift:  Total I/O In: 120 [P.O.:120] Out: -   Physical Exam: General: No acute distress  Head: Normocephalic, atraumatic. Moist oral mucosal membranes  Eyes: Anicteric  Neck: Supple, trachea midline  Lungs:  Basilar rales, normal effort  Heart: S1S2 no rubs  Abdomen:  Soft, nontender, bowel sounds present  Extremities: no peripheral edema.  Neurologic: Nonfocal, moving all four extremities  Skin: No lesions  Access: Left upper extremity access    Basic Metabolic Panel:  Recent Labs Lab 07/05/16 1842 07/06/16 0619 07/07/16 1014 07/09/16 0945  NA 135 133*  --  132*  K 3.4* 3.8  --  4.0  CL 95* 97*  --  94*  CO2 28 25  --  27  GLUCOSE 104* 95  --  93  BUN 20 25*  --  44*  CREATININE 3.88* 4.92*   --  7.68*  CALCIUM 8.0* 7.7*  --  7.6*  MG 1.4*  --   --   --   PHOS 1.8*  --  4.5 5.4*    Liver Function Tests:  Recent Labs Lab 07/05/16 1842 07/09/16 0945  AST 31  --   ALT 13*  --   ALKPHOS 66  --   BILITOT 0.8  --   PROT 7.7  --   ALBUMIN 3.3* 2.7*   No results for input(s): LIPASE, AMYLASE in the last 168 hours. No results for input(s): AMMONIA in the last 168 hours.  CBC:  Recent Labs Lab 07/05/16 1842 07/06/16 1540 07/07/16 0614 07/08/16 0411 07/09/16 0556 07/09/16 0945  WBC 4.5 3.5* 2.6* 2.4* 2.4* 2.9*  NEUTROABS 3.3  --   --   --   --   --   HGB 11.2* 10.4* 11.3* 11.3* 10.8* 10.4*  HCT 34.4* 31.6* 35.1 34.6* 32.7* 31.8*  MCV 90.6 90.2 91.8 91.5 90.1 89.5  PLT 134* 112* 113* 98* 90* 101*    Cardiac Enzymes:  Recent Labs Lab 07/05/16 1842  TROPONINI 0.03*    BNP: Invalid input(s): POCBNP  CBG:  Recent Labs Lab 07/08/16 0950 07/08/16 1146 07/08/16 1659 07/08/16 2132 07/09/16 0736  GLUCAP 108* 112* 103* 84 70*    Microbiology: Results for orders placed or performed during the hospital encounter of 07/05/16  Culture, blood (  Routine x 2)     Status: None (Preliminary result)   Collection Time: 07/05/16  6:42 PM  Result Value Ref Range Status   Specimen Description BLOOD RIGHT WRIST  Final   Special Requests BAA5ML BAE5ML  Final   Culture NO GROWTH 4 DAYS  Final   Report Status PENDING  Incomplete  Culture, blood (Routine x 2)     Status: None (Preliminary result)   Collection Time: 07/05/16  6:47 PM  Result Value Ref Range Status   Specimen Description BLOOD RIGHT AC  Final   Special Requests BAA 5ML BAE 5ML  Final   Culture NO GROWTH 4 DAYS  Final   Report Status PENDING  Incomplete  Urine culture     Status: None   Collection Time: 07/05/16  8:37 PM  Result Value Ref Range Status   Specimen Description URINE, RANDOM  Final   Special Requests NONE  Final   Culture   Final    NO GROWTH 1 DAY Performed at [facility]     Report Status 07/07/2016 FINAL  Final  MRSA PCR Screening     Status: None   Collection Time: 07/05/16 11:45 PM  Result Value Ref Range Status   MRSA by PCR NEGATIVE NEGATIVE Final    Comment:        The GeneXpert MRSA Assay (FDA approved for NASAL specimens only), is one component of a comprehensive MRSA colonization surveillance program. It is not intended to diagnose MRSA infection nor to guide or monitor treatment for MRSA infections.   Culture, sputum-assessment     Status: None   Collection Time: 07/06/16 12:20 PM  Result Value Ref Range Status   Specimen Description SPUTUM  Final   Special Requests Normal  Final   Sputum evaluation   Final    Sputum specimen not acceptable for testing.  Please recollect.   C/OBETH RICHARDSON RN AT 8811 07/06/16 MSS    Report Status 07/06/2016 FINAL  Final  C difficile quick scan w PCR reflex     Status: None   Collection Time: 07/06/16  1:43 PM  Result Value Ref Range Status   C Diff antigen NEGATIVE NEGATIVE Final   C Diff toxin NEGATIVE NEGATIVE Final   C Diff interpretation No C. difficile detected.  Final    Coagulation Studies: No results for input(s): LABPROT, INR in the last 72 hours.  Urinalysis: No results for input(s): COLORURINE, LABSPEC, PHURINE, GLUCOSEU, HGBUR, BILIRUBINUR, KETONESUR, PROTEINUR, UROBILINOGEN, NITRITE, LEUKOCYTESUR in the last 72 hours.  Invalid input(s): APPERANCEUR    Imaging: Dg Chest 2 View  Result Date: 07/07/2016 CLINICAL DATA:  Fever, productive cough, history of asthma, smoking history EXAM: CHEST  2 VIEW COMPARISON:  Chest x-ray of 07/05/2016 FINDINGS: No pneumonia or effusion is seen. However there are still somewhat prominent interstitial markings with slight fullness of the perihilar vasculature which may indicate mild edema. Cardiomegaly is stable. No bony abnormality is seen. IMPRESSION: Stable cardiomegaly.  Still suspect mild edema. Electronically Signed   By: Ivar Drape M.D.    On: 07/07/2016 16:58     Medications:     . amLODipine  10 mg Oral Daily  . aspirin  81 mg Oral Daily  . atorvastatin  80 mg Oral Daily  . calcium acetate  1,334 mg Oral TID WC  . calcium carbonate  3 tablet Oral Q breakfast  . cholecalciferol  5,000 Units Oral Daily  . guaiFENesin  600 mg Oral BID   And  .  dextromethorphan  30 mg Oral BID  . diclofenac  1 patch Transdermal BID  . doxycycline  100 mg Oral Q12H  . DULoxetine  60 mg Oral Daily  . folic acid  1 mg Oral Daily  . heparin  5,000 Units Subcutaneous Q8H  . insulin aspart  0-20 Units Subcutaneous TID WC  . insulin aspart  0-5 Units Subcutaneous QHS  . metoprolol succinate  100 mg Oral Daily  . mometasone-formoterol  2 puff Inhalation BID  . omega-3 acid ethyl esters  1 g Oral BID  . pantoprazole  40 mg Oral BID  . sodium chloride flush  3 mL Intravenous Q12H   acetaminophen **OR** acetaminophen, benzonatate, bisacodyl, HYDROcodone-acetaminophen, ibuprofen, ipratropium-albuterol, lubiprostone, magnesium citrate, meclizine, methocarbamol, nitroGLYCERIN, ondansetron **OR** ondansetron (ZOFRAN) IV, oxyCODONE, senna-docusate, zolpidem  Assessment/ Plan:  65 y.o. female with hypertension, coronary artery disease s/p CABG, peripheral vascular disease, diabetes mellitus type II, hyperlipidemia, bilateral total knee replacement, ESRD, anemia of CKD, SHPTH who presents now with fever and infiltrates on CXR.  1.  ESRD on HD MWF [place].   -  Patient seen during dialysis Tolerating well  2.  Fever/suspected bacterial pneumonia: Influenza screen negative and blood cultures remain negative. . Empiric Abx  Doxy as per hospitalist/ ID team  3.  Anemia of CKD: Hemoglobin up to 10.4.  - Will start epogen with next dialysis  4.  SHPTH:  Phosphorous at 5.4, continue calcium acetate 2 tabs po tid/wm.   LOS: 4 Kimberly Shaffer 10/21/20171:09 PM

## 2016-07-09 NOTE — Progress Notes (Signed)
Pre hd info 

## 2016-07-09 NOTE — Progress Notes (Signed)
Hd start 

## 2016-07-09 NOTE — Progress Notes (Signed)
  End of hd 

## 2016-07-09 NOTE — Progress Notes (Signed)
Post hd vitals 

## 2016-07-09 NOTE — Discharge Instructions (Signed)
°  DIET:  Renal diet, diabetic diet  DISCHARGE CONDITION:  Stable  ACTIVITY:  Activity as tolerated  OXYGEN:  Home Oxygen: Yes.     Oxygen Delivery: 2 liters/min via continous o2  DISCHARGE LOCATION:  home    ADDITIONAL DISCHARGE INSTRUCTION:resume hd as before   If you experience worsening of your admission symptoms, develop shortness of breath, life threatening emergency, suicidal or homicidal thoughts you must seek medical attention immediately by calling 911 or calling your MD immediately  if symptoms less severe.  You Must read complete instructions/literature along with all the possible adverse reactions/side effects for all the Medicines you take and that have been prescribed to you. Take any new Medicines after you have completely understood and accpet all the possible adverse reactions/side effects.   Please note  You were cared for by a hospitalist during your hospital stay. If you have any questions about your discharge medications or the care you received while you were in the hospital after you are discharged, you can call the unit and asked to speak with the hospitalist on call if the hospitalist that took care of you is not available. Once you are discharged, your primary care physician will handle any further medical issues. Please note that NO REFILLS for any discharge medications will be authorized once you are discharged, as it is imperative that you return to your primary care physician (or establish a relationship with a primary care physician if you do not have one) for your aftercare needs so that they can reassess your need for medications and monitor your lab values.

## 2016-07-09 NOTE — Progress Notes (Signed)
Pt d/c to home today.  Informed pt that Rx's are waiting at her pharmacy.  Also informed pt (Per Jeani Hawking, [place]) to contact her PCP on Monday to set up outpatient PT. D/C paperwork reviewed and education provided with all questions and concerns addressed.  Pt daughter at bedside for home transport.

## 2016-07-09 NOTE — Progress Notes (Signed)
[place] [facility] [place] NAME: Kimberly Shaffer    MR#:  [id number]  DATE OF BIRTH:  Apr 16, 1951  SUBJECTIVE:  CHIEF COMPLAINT:   Chief Complaint  Patient presents with  . Fever  . Shortness of Breath   Feels much better seen in dialysis once to go home    Review of Systems  Constitutional: Negative for chills, fever and weight loss.  HENT: Negative for congestion.   Eyes: Negative for blurred vision and double vision.  Respiratory: Positive for cough. Negative for sputum production, shortness of breath and wheezing.   Cardiovascular: Negative for chest pain, palpitations, orthopnea, leg swelling and PND.  Gastrointestinal: Negative for abdominal pain, blood in stool, constipation, diarrhea, nausea and vomiting.  Genitourinary: Negative for dysuria, frequency, hematuria and urgency.  Musculoskeletal: Negative for falls.  Neurological: Negative for dizziness, tremors, focal weakness and headaches.  Endo/Heme/Allergies: Does not bruise/bleed easily.  Psychiatric/Behavioral: Negative for depression. The patient does not have insomnia.     VITAL SIGNS: Blood pressure 112/65, pulse 67, temperature 99 F (37.2 C), temperature source Oral, resp. rate 20, height 5\' 6"  (1.676 m), weight 233 lb 7.5 oz (105.9 kg), SpO2 96 %.  PHYSICAL EXAMINATION:   GENERAL:  65 y.o.-year-old patient lying in the bed with no acute distress. Alert, comfortable, has myoclonus in upper extremities, difficulty holding A cup  EYES: Pupils equal, round, reactive to light and accommodation. No scleral icterus. Extraocular muscles intact.  HEENT: Head atraumatic, normocephalic. Oropharynx and nasopharynx clear.  NECK:  Supple, no jugular venous distention. No thyroid enlargement, no tenderness.  LUNGS: Normal breath sounds bilaterally, no wheezing,  rales,rhonchi or crepitations noted today on auscultation. No use of accessory muscles of respiration. Few crackles at  bases CARDIOVASCULAR: S1, S2 normal. No murmurs, rubs, or gallops.  ABDOMEN: Soft, nontender, nondistended. Bowel sounds present. No organomegaly or mass.  EXTREMITIES: No pedal edema, cyanosis, or clubbing.  NEUROLOGIC: Cranial nerves II through XII are intact. Muscle strength 5/5 in all extremities. Sensation intact. Gait not checked.  PSYCHIATRIC: The patient is alert, oriented 3  SKIN: No obvious rash, lesion, or ulcer.   ORDERS/RESULTS REVIEWED:   CBC  Recent Labs Lab 07/05/16 1842 07/06/16 5361 07/07/16 4431 07/08/16 0411 07/09/16 0556 07/09/16 0945  WBC 4.5 3.5* 2.6* 2.4* 2.4* 2.9*  HGB 11.2* 10.4* 11.3* 11.3* 10.8* 10.4*  HCT 34.4* 31.6* 35.1 34.6* 32.7* 31.8*  PLT 134* 112* 113* 98* 90* 101*  MCV 90.6 90.2 91.8 91.5 90.1 89.5  MCH 29.4 29.8 29.5 29.8 29.6 29.4  MCHC 32.5 33.0 32.1 32.6 32.9 32.8  RDW 18.0* 17.9* 18.0* 17.9* 18.0* 17.8*  LYMPHSABS 0.5*  --   --   --   --   --   MONOABS 0.5  --   --   --   --   --   EOSABS 0.2  --   --   --   --   --   BASOSABS 0.0  --   --   --   --   --    ------------------------------------------------------------------------------------------------------------------  Chemistries   Recent Labs Lab 07/05/16 1842 07/06/16 0619 07/09/16 0945  NA 135 133* 132*  K 3.4* 3.8 4.0  CL 95* 97* 94*  CO2 28 25 27   GLUCOSE 104* 95 93  BUN 20 25* 44*  CREATININE 3.88* 4.92* 7.68*  CALCIUM 8.0* 7.7* 7.6*  MG 1.4*  --   --   AST 31  --   --  ALT 13*  --   --   ALKPHOS 66  --   --   BILITOT 0.8  --   --    ------------------------------------------------------------------------------------------------------------------ estimated creatinine clearance is 9 mL/min (by C-G formula based on SCr of 7.68 mg/dL (H)). ------------------------------------------------------------------------------------------------------------------ No results for input(s): TSH, T4TOTAL, T3FREE, THYROIDAB in the last 72 hours.  Invalid input(s):  FREET3  Cardiac Enzymes  Recent Labs Lab 07/05/16 1842  TROPONINI 0.03*   ------------------------------------------------------------------------------------------------------------------ Invalid input(s): POCBNP ---------------------------------------------------------------------------------------------------------------  RADIOLOGY: Dg Chest 2 View  Result Date: 07/07/2016 CLINICAL DATA:  Fever, productive cough, history of asthma, smoking history EXAM: CHEST  2 VIEW COMPARISON:  Chest x-ray of 07/05/2016 FINDINGS: No pneumonia or effusion is seen. However there are still somewhat prominent interstitial markings with slight fullness of the perihilar vasculature which may indicate mild edema. Cardiomegaly is stable. No bony abnormality is seen. IMPRESSION: Stable cardiomegaly.  Still suspect mild edema. Electronically Signed   By: Ivar Drape M.D.   On: 07/07/2016 16:58    EKG:  Orders placed or performed during the hospital encounter of 07/05/16  . EKG 12-Lead  . EKG 12-Lead    ASSESSMENT AND PLAN: Patient is a 65 year old admitted with cavitary pneumonia  #1. Sepsis  due to acute bronchitis, appreciate infectious disease input patient will be discharged on Augmentin and doxycycline for 7 days #2  . Acute bronchitis, sputum cultures revealed normal flora, continue doxycycline orally  #3. Diarrhea, ?related due to antibiotics, diarrhea is improved #4. End-stage renal disease, dialysis per schedule, appreciate nephrology's input, a shin has myoclonus today, seemed to be worsening from before, questionable uremic symptoms.  #5 Pancytopenia, her laboratory blood work shows improvement will need a follow-up CBC with primary care provider continue doxycycline Management plans discussed with the patient, family and they are in agreement.   DRUG ALLERGIES:  Allergies  Allergen Reactions  . Nystatin Hives and Itching  . Sulfa Antibiotics Swelling, Hives and Rash  . Niaspan [Niacin]  Other (See Comments) and Hives    CODE STATUS:     Code Status Orders        Start     Ordered   07/05/16 2237  Full code  Continuous     07/05/16 2236    Code Status History    Date Active Date Inactive Code Status Order ID Comments User Context   12/17/2015  8:12 AM 12/20/2015  7:57 PM Full Code [id number]  Saundra Shelling, MD Inpatient      TOTAL TIME TAKING CARE OF THIS PATIENT: 35 minutes.  Discussed with Dr. Zachery Dauer, [facility] M.D on 07/09/2016 at 1:01 PM  Between 7am to 6pm - Pager - [phone]  After 6pm go to [email or web address] - password [email or web address] [place] [facility]  Office  [phone]  CC: Primary care physician; Pcp Not In System

## 2016-07-09 NOTE — Care Management Note (Signed)
Case Management Note  Patient Details  Name: Kimberly Shaffer MRN: [id number] Date of Birth: 1951/05/10  Subjective/Objective:    Did not qualify for home oxygen per oxygen progression trials with her nurse today. No new oxygen ordered. Mrs Afzal agreed to f/u with her PCP on Monday to request a referral to an outpatient PT provider.                 Action/Plan:   Expected Discharge Date:                  Expected Discharge Plan:     In-House Referral:     Discharge planning Services     Post Acute Care Choice:    Choice offered to:     DME Arranged:    DME Agency:     HH Arranged:    HH Agency:     Status of Service:     If discussed at [employer] of Stay Meetings, dates discussed:    Additional Comments:  Aleja Yearwood A, RN 07/09/2016, 3:55 PM

## 2016-07-09 NOTE — Progress Notes (Signed)
Pt was weaned off O2.  Pt was able to sustain oxygen saturation between 91%-93% on room air.

## 2016-07-09 NOTE — Discharge Summary (Signed)
Kimberly Shaffer, 65 y.o., DOB [birth year], MRN [id number]. Admission date: 07/05/2016 Discharge Date 07/09/2016 Primary MD Pcp Not In [place], DO  Admission Diagnosis  Acute cystitis without hematuria [N30.00] Sepsis, due to unspecified organism ([place]) [A41.9] Community acquired pneumonia, unspecified laterality [J18.9]  Discharge Diagnosis   Active Problems:   Sepsis ruled out Fever due to acute bronchitis Pancytopenia of unclear etiology not improving End-stage renal disease Hypoxia  Due to COPD  Coronary artery disease Depression Diabetes Hypercholesterolemia Essential hypertension Coronary artery disease   previous history of CVA       Hospital Course Kimberly Shaffer is a 65 y.o. female admitted with fevers, sob, productive cough and weakness and nausea for 3 days. She is a HD patient but reports no problems at HD and no issues with her LUE fistula. She has had no skin or soft tissue infections, recent UTIs, sore throats, HA or other sxs. Patient did have some complaint of cough and pulmonary symptoms. Therefore she was treated for acute bronchitis. Patient had blood cultures which were negative. She also had diarrhea showed no evidence of C. difficile. She was seen in consultation by infectious disease. They recommended continuing antibiotics. So far all her cultures are negative. She is recommended to be treated with doxycycline and Augmentin. Patient did have pancytopenia with her presentation. She will need a repeat CBC done by her primary care provider. Patient is also requiring oxygen and therefore she'll be qualified for home oxygen. She also was hemodialyzed as per her routine during hospitalization.             Consults  nephrology, id  Significant Tests:  See full reports for all details     Dg Chest 2 View  Result Date: 07/07/2016 CLINICAL DATA:  Fever, productive cough, history of asthma, smoking history EXAM: CHEST  2  VIEW COMPARISON:  Chest x-ray of 07/05/2016 FINDINGS: No pneumonia or effusion is seen. However there are still somewhat prominent interstitial markings with slight fullness of the perihilar vasculature which may indicate mild edema. Cardiomegaly is stable. No bony abnormality is seen. IMPRESSION: Stable cardiomegaly.  Still suspect mild edema. Electronically Signed   By: Ivar Drape M.D.   On: 07/07/2016 16:58   Dg Chest 2 View  Result Date: 07/05/2016 CLINICAL DATA:  Fever, chills, shortness of breath. EXAM: CHEST  2 VIEW COMPARISON:  Radiographs of December 19, 2015. FINDINGS: Stable cardiomediastinal silhouette. No pneumothorax or significant pleural effusion is noted. Multilevel degenerative disc disease is noted in the thoracic spine. Mildly increased basilar interstitial densities are noted concerning for worsening pulmonary edema. IMPRESSION: Mildly increased bibasilar interstitial densities concerning for worsening edema. Electronically Signed   By: Marijo Conception, M.D.   On: 07/05/2016 18:58   Dg Hip Unilat With Pelvis 2-3 Views Right  Result Date: 07/06/2016 CLINICAL DATA:  Subacute onset of right hip pain. Initial encounter. EXAM: DG HIP (WITH OR WITHOUT PELVIS) 2-3V RIGHT COMPARISON:  None. FINDINGS: There is no evidence of fracture or dislocation. Axial joint space narrowing is noted at the right hip, with mild sclerosis and subcortical cystic change. The proximal right femur appears intact. Mild degenerative change is noted at the sacroiliac joints, pubic symphysis and lower lumbar spine. The left hip joint is grossly unremarkable in appearance. The visualized bowel gas pattern is grossly unremarkable in appearance. Scattered phleboliths are noted within the pelvis. IMPRESSION: 1. Axial joint space narrowing at the right hip, with mild sclerosis and subcortical cystic change.  2. No evidence of fracture or dislocation. Electronically Signed   By: Garald Balding M.D.   On: 07/06/2016 01:16        Today   Subjective:   Kimberly Shaffer  feeling much better wants to go home shortness of breath improved   Objective:   Blood pressure 109/67, pulse 76, temperature 99 F (37.2 C), temperature source Oral, resp. rate (!) 23, height 5\' 6"  (1.676 m), weight 233 lb 7.5 oz (105.9 kg), SpO2 97 %.  .  Intake/Output Summary (Last 24 hours) at 07/09/16 1305 Last data filed at 07/09/16 1017  Gross per 24 hour  Intake              120 ml  Output                0 ml  Net              120 ml    Exam VITAL SIGNS: Blood pressure 109/67, pulse 76, temperature 99 F (37.2 C), temperature source Oral, resp. rate (!) 23, height 5\' 6"  (1.676 m), weight 233 lb 7.5 oz (105.9 kg), SpO2 97 %.  GENERAL:  65 y.o.-year-old patient lying in the bed with no acute distress.  EYES: Pupils equal, round, reactive to light and accommodation. No scleral icterus. Extraocular muscles intact.  HEENT: Head atraumatic, normocephalic. Oropharynx and nasopharynx clear.  NECK:  Supple, no jugular venous distention. No thyroid enlargement, no tenderness.  LUNGS: Normal breath sounds bilaterally, no wheezing, rales,rhonchi or crepitation. No use of accessory muscles of respiration.  CARDIOVASCULAR: S1, S2 normal. No murmurs, rubs, or gallops.  ABDOMEN: Soft, nontender, nondistended. Bowel sounds present. No organomegaly or mass.  EXTREMITIES: No pedal edema, cyanosis, or clubbing.  NEUROLOGIC: Cranial nerves II through XII are intact. Muscle strength 5/5 in all extremities. Sensation intact. Gait not checked.  PSYCHIATRIC: The patient is alert and oriented x 3.  SKIN: No obvious rash, lesion, or ulcer.   Data Review     CBC w Diff: Lab Results  Component Value Date   WBC 2.9 (L) 07/09/2016   HGB 10.4 (L) 07/09/2016   HGB 9.3 (L) 04/09/2014   HCT 31.8 (L) 07/09/2016   HCT 29.0 (L) 04/09/2014   PLT 101 (L) 07/09/2016   PLT 191 04/09/2014   LYMPHOPCT 12 07/05/2016   LYMPHOPCT 18.5 04/09/2014   MONOPCT 10  07/05/2016   MONOPCT 7.9 04/09/2014   EOSPCT 4 07/05/2016   EOSPCT 2.4 04/09/2014   BASOPCT 0 07/05/2016   BASOPCT 0.5 04/09/2014   CMP: Lab Results  Component Value Date   NA 132 (L) 07/09/2016   NA 140 04/09/2014   K 4.0 07/09/2016   K 4.5 04/09/2014   CL 94 (L) 07/09/2016   CL 113 (H) 04/09/2014   CO2 27 07/09/2016   CO2 20 (L) 04/09/2014   BUN 44 (H) 07/09/2016   BUN 36 (H) 04/09/2014   CREATININE 7.68 (H) 07/09/2016   CREATININE 3.34 (H) 04/09/2014   PROT 7.7 07/05/2016   PROT 8.9 (H) 07/20/2013   ALBUMIN 2.7 (L) 07/09/2016   ALBUMIN 3.5 07/20/2013   BILITOT 0.8 07/05/2016   BILITOT 0.5 07/20/2013   ALKPHOS 66 07/05/2016   ALKPHOS 127 07/20/2013   AST 31 07/05/2016   AST 39 (H) 07/20/2013   ALT 13 (L) 07/05/2016   ALT 22 07/20/2013  .  Micro Results Recent Results (from the past 240 hour(s))  Culture, blood (Routine x 2)     Status: None (  Preliminary result)   Collection Time: 07/05/16  6:42 PM  Result Value Ref Range Status   Specimen Description BLOOD RIGHT WRIST  Final   Special Requests BAA5ML BAE5ML  Final   Culture NO GROWTH 4 DAYS  Final   Report Status PENDING  Incomplete  Culture, blood (Routine x 2)     Status: None (Preliminary result)   Collection Time: 07/05/16  6:47 PM  Result Value Ref Range Status   Specimen Description BLOOD RIGHT AC  Final   Special Requests BAA 5ML BAE 5ML  Final   Culture NO GROWTH 4 DAYS  Final   Report Status PENDING  Incomplete  Urine culture     Status: None   Collection Time: 07/05/16  8:37 PM  Result Value Ref Range Status   Specimen Description URINE, RANDOM  Final   Special Requests NONE  Final   Culture   Final    NO GROWTH 1 DAY Performed at [facility]    Report Status 07/07/2016 FINAL  Final  MRSA PCR Screening     Status: None   Collection Time: 07/05/16 11:45 PM  Result Value Ref Range Status   MRSA by PCR NEGATIVE NEGATIVE Final    Comment:        The GeneXpert MRSA Assay (FDA approved  for NASAL specimens only), is one component of a comprehensive MRSA colonization surveillance program. It is not intended to diagnose MRSA infection nor to guide or monitor treatment for MRSA infections.   Culture, sputum-assessment     Status: None   Collection Time: 07/06/16 12:20 PM  Result Value Ref Range Status   Specimen Description SPUTUM  Final   Special Requests Normal  Final   Sputum evaluation   Final    Sputum specimen not acceptable for testing.  Please recollect.   C/OBETH RICHARDSON RN AT [id number] 07/06/16 MSS    Report Status 07/06/2016 FINAL  Final  C difficile quick scan w PCR reflex     Status: None   Collection Time: 07/06/16  1:43 PM  Result Value Ref Range Status   C Diff antigen NEGATIVE NEGATIVE Final   C Diff toxin NEGATIVE NEGATIVE Final   C Diff interpretation No C. difficile detected.  Final        Code Status Orders        Start     Ordered   07/05/16 2237  Full code  Continuous     07/05/16 2236    Code Status History    Date Active Date Inactive Code Status Order ID Comments User Context   12/17/2015  8:12 AM 12/20/2015  7:57 PM Full Code [id number]  Saundra Shelling, MD Inpatient          Follow-up Information    pcp .           Discharge Medications     Medication List    STOP taking these medications   cefUROXime 500 MG tablet Commonly known as:  CEFTIN     TAKE these medications   albuterol 108 (90 Base) MCG/ACT inhaler Commonly known as:  PROVENTIL HFA;VENTOLIN HFA Inhale into the lungs every 4 (four) hours as needed for wheezing or shortness of breath. 2-4 puffs   amLODipine 10 MG tablet Commonly known as:  NORVASC Take 10 mg by mouth daily.   amoxicillin-clavulanate 875-125 MG tablet Commonly known as:  AUGMENTIN Take 1 tablet by mouth 2 (two) times daily.   aspirin 81 MG tablet Take 81 mg  by mouth daily.   atorvastatin 80 MG tablet Commonly known as:  LIPITOR Take 80 mg by mouth daily.   calcium carbonate  1500 (600 Ca) MG Tabs tablet Commonly known as:  OSCAL Take 600 mg of elemental calcium by mouth daily with breakfast.   diclofenac 1.3 % Ptch Commonly known as:  FLECTOR Apply one patch for a portion of patch to painful area of skin for strain, sprain, and spasms of the upper back and shoulder twice per day if tolerated   doxycycline 100 MG capsule Commonly known as:  MONODOX Take 1 capsule (100 mg total) by mouth 2 (two) times daily.   DULoxetine 60 MG capsule Commonly known as:  CYMBALTA Take 60 mg by mouth daily.   Fish Oil 1000 MG Cpdr Take by mouth daily.   Fluticasone-Salmeterol 250-50 MCG/DOSE Aepb Commonly known as:  ADVAIR DISKUS Inhale 1 puff into the lungs 2 (two) times daily.   FOLIC ACID PO Take 1 mg by mouth daily.   ipratropium 0.02 % nebulizer solution Commonly known as:  ATROVENT Take 2.5 mLs (0.5 mg total) by nebulization every 6 (six) hours as needed for wheezing or shortness of breath.   lubiprostone 24 MCG capsule Commonly known as:  AMITIZA Take 24 mcg by mouth daily as needed for constipation.   meclizine 25 MG tablet Commonly known as:  ANTIVERT Take 25 mg by mouth 3 (three) times daily as needed for dizziness.   methocarbamol 750 MG tablet Commonly known as:  ROBAXIN Take 750 mg by mouth daily as needed for muscle spasms.   metoprolol succinate 100 MG 24 hr tablet Commonly known as:  TOPROL-XL Take 100 mg by mouth daily. Take with or immediately following a meal.   nitroGLYCERIN 0.4 MG/SPRAY spray Commonly known as:  NITROLINGUAL Place 1 spray under the tongue every 5 (five) minutes x 3 doses as needed for chest pain.   ONE-A-DAY WOMENS 50 PLUS PO Take 1 tablet by mouth daily.   oxyCODONE 5 MG immediate release tablet Commonly known as:  ROXICODONE Take 1 tablet (5 mg total) by mouth every 8 (eight) hours as needed.   pantoprazole 40 MG tablet Commonly known as:  PROTONIX Take 40 mg by mouth 2 (two) times daily.    Pramoxine-Dimethicone 1-6 % Crea Apply topically.   Vitamin D3 5000 units Caps Take 1 tablet by mouth daily.          Total Time in preparing paper work, data evaluation and todays exam - 35 minutes  Dustin Flock M.D on 07/09/2016 at 1:05 PM  [facility]   Office  [phone]

## 2016-07-10 LAB — CULTURE, BLOOD (ROUTINE X 2)
CULTURE: NO GROWTH
CULTURE: NO GROWTH

## 2016-07-13 NOTE — Progress Notes (Deleted)
[facility] Consultation      Assessment and Plan:  COPD. --Recommend that she continue to use proair and will add Advair 250 bid.  --Will start on nebulizer and ipratropium tid.  --CXR and PFT before next visit.   Nicotine abuse. --Discussed the importance of smoking cessation.   End-stage renal disease, currently on hemodialysis. --May be contributing to dyspnea due.  History of stroke and Coronary artery disease.  Dyspnea.  -Multifactorial due to COPD, deconditioning, obesity.    Date: 07/13/2016  MRN# [id number] Kimberly Shaffer 1951/03/24  Referring Physician: Dr. Jeralyn Ruths is a 65 y.o. old female seen in consultation for chief complaint of:    No chief complaint on file.   HPI:   The patient is a 65 year old female with a history of smoking, stroke, chronic kidney disease,  COPD,  coronary artery disease. At last visit it was noted that she had COPD as well as likely deconditioning. She was started on nebulized ipratropiu, proair, advair, and we discussed smoking cessation, though she did not appear serious about quitting yet.   Review of CXR images from 10/19 and 07/05/16 shows improving interstitial edema.  Desat walk 01/19/16; Beginning 02 sat was 94% and HR 82. After walking 300 feet at slow pace, sat was 94% and HR 94. Gait was wide, slow. She had moderate dyspnea and was conversational.     Allergies:  Nystatin; Sulfa antibiotics; and Niaspan [niacin]  Review of Systems: Gen:  Denies  fever, sweats, chills HEENT: Denies blurred vision, double vision. bleeds, sore throat Cvc:  No dizziness, chest pain. Resp:   Denies cough or sputum production, shortness of breath Gi: Denies swallowing difficulty, stomach pain. Gu:  Denies bladder incontinence, burning urine Ext:   No Joint pain, stiffness. Skin: No skin rash,  hives  Endoc:  No polyuria, polydipsia. Psych: No depression, insomnia. Other:  All other systems were reviewed  with the patient and were negative other that what is mentioned in the HPI.   Physical Examination:   VS: There were no vitals taken for this visit.  General Appearance: No distress  Neuro:without focal findings,  speech normal,  HEENT: PERRLA, EOM intact.   Pulmonary: normal breath sounds, Scattered left side crackles.  CardiovascularNormal S1,S2.  No m/r/g.   Abdomen: Benign, Soft, non-tender. Renal:  No costovertebral tenderness  GU:  No performed at this time. Endoc: No evident thyromegaly, no signs of acromegaly. Skin:   warm, no rashes, no ecchymosis  Extremities: normal, no cyanosis, clubbing.  Other findings:    LABORATORY PANEL:   CBC  Recent Labs Lab 07/09/16 0945  WBC 2.9*  HGB 10.4*  HCT 31.8*  PLT 101*   ------------------------------------------------------------------------------------------------------------------  Chemistries   Recent Labs Lab 07/09/16 0945  NA 132*  K 4.0  CL 94*  CO2 27  GLUCOSE 93  BUN 44*  CREATININE 7.68*  CALCIUM 7.6*   ------------------------------------------------------------------------------------------------------------------  Cardiac Enzymes No results for input(s): TROPONINI in the last 168 hours. ------------------------------------------------------------  RADIOLOGY:  No results found.     Thank  you for the consultation and for allowing [facility] to assist in the care of your patient. Our recommendations are noted above.  Please contact us if we can be of further service.   Marda Stalker, MD.  Board Certified in Internal Medicine, Pulmonary Medicine, [place], and Sleep Medicine.  [facility] Office Number: [phone]  Patricia Pesa, M.D.  Vilinda Boehringer, M.D.  Merton Border, M.D  07/13/2016

## 2016-07-17 ENCOUNTER — Other Ambulatory Visit: Payer: Self-pay | Admitting: Pain Medicine

## 2016-07-19 ENCOUNTER — Encounter: Payer: Self-pay | Admitting: *Deleted

## 2016-07-19 ENCOUNTER — Encounter
Admission: RE | Disposition: A | Discharge status: Home / Self Care | Payer: Self-pay | Source: Ambulatory Visit | Attending: Internal Medicine

## 2016-07-19 ENCOUNTER — Ambulatory Visit
Admission: RE | Admit: 2016-07-19 | Discharge: 2016-07-19 | Disposition: A | Discharge status: Home / Self Care | Payer: Medicare HMO | Source: Ambulatory Visit | Attending: Internal Medicine | Admitting: Internal Medicine

## 2016-07-19 DIAGNOSIS — I252 Old myocardial infarction: Secondary | ICD-10-CM | POA: Insufficient documentation

## 2016-07-19 DIAGNOSIS — Z9989 Dependence on other enabling machines and devices: Secondary | ICD-10-CM | POA: Insufficient documentation

## 2016-07-19 DIAGNOSIS — Z881 Allergy status to other antibiotic agents status: Secondary | ICD-10-CM | POA: Diagnosis not present

## 2016-07-19 DIAGNOSIS — E7800 Pure hypercholesterolemia, unspecified: Secondary | ICD-10-CM | POA: Insufficient documentation

## 2016-07-19 DIAGNOSIS — I25118 Atherosclerotic heart disease of native coronary artery with other forms of angina pectoris: Secondary | ICD-10-CM | POA: Insufficient documentation

## 2016-07-19 DIAGNOSIS — I739 Peripheral vascular disease, unspecified: Secondary | ICD-10-CM | POA: Diagnosis not present

## 2016-07-19 DIAGNOSIS — F17210 Nicotine dependence, cigarettes, uncomplicated: Secondary | ICD-10-CM | POA: Diagnosis not present

## 2016-07-19 DIAGNOSIS — Z862 Personal history of diseases of the blood and blood-forming organs and certain disorders involving the immune mechanism: Secondary | ICD-10-CM | POA: Diagnosis not present

## 2016-07-19 DIAGNOSIS — Z8673 Personal history of transient ischemic attack (TIA), and cerebral infarction without residual deficits: Secondary | ICD-10-CM | POA: Diagnosis not present

## 2016-07-19 DIAGNOSIS — G4733 Obstructive sleep apnea (adult) (pediatric): Secondary | ICD-10-CM | POA: Diagnosis not present

## 2016-07-19 DIAGNOSIS — Z7982 Long term (current) use of aspirin: Secondary | ICD-10-CM | POA: Diagnosis not present

## 2016-07-19 DIAGNOSIS — Z955 Presence of coronary angioplasty implant and graft: Secondary | ICD-10-CM | POA: Diagnosis not present

## 2016-07-19 DIAGNOSIS — J449 Chronic obstructive pulmonary disease, unspecified: Secondary | ICD-10-CM | POA: Insufficient documentation

## 2016-07-19 DIAGNOSIS — M1990 Unspecified osteoarthritis, unspecified site: Secondary | ICD-10-CM | POA: Insufficient documentation

## 2016-07-19 DIAGNOSIS — K219 Gastro-esophageal reflux disease without esophagitis: Secondary | ICD-10-CM | POA: Diagnosis not present

## 2016-07-19 DIAGNOSIS — E669 Obesity, unspecified: Secondary | ICD-10-CM | POA: Diagnosis not present

## 2016-07-19 DIAGNOSIS — I5030 Unspecified diastolic (congestive) heart failure: Secondary | ICD-10-CM | POA: Insufficient documentation

## 2016-07-19 DIAGNOSIS — Z79899 Other long term (current) drug therapy: Secondary | ICD-10-CM | POA: Diagnosis not present

## 2016-07-19 DIAGNOSIS — Z6836 Body mass index (BMI) 36.0-36.9, adult: Secondary | ICD-10-CM | POA: Diagnosis not present

## 2016-07-19 DIAGNOSIS — I130 Hypertensive heart and chronic kidney disease with heart failure and stage 1 through stage 4 chronic kidney disease, or unspecified chronic kidney disease: Secondary | ICD-10-CM | POA: Diagnosis not present

## 2016-07-19 DIAGNOSIS — I208 Other forms of angina pectoris: Secondary | ICD-10-CM

## 2016-07-19 DIAGNOSIS — Z888 Allergy status to other drugs, medicaments and biological substances status: Secondary | ICD-10-CM | POA: Insufficient documentation

## 2016-07-19 DIAGNOSIS — N189 Chronic kidney disease, unspecified: Secondary | ICD-10-CM | POA: Insufficient documentation

## 2016-07-19 DIAGNOSIS — Z7951 Long term (current) use of inhaled steroids: Secondary | ICD-10-CM | POA: Insufficient documentation

## 2016-07-19 DIAGNOSIS — I2089 Other forms of angina pectoris: Secondary | ICD-10-CM

## 2016-07-19 DIAGNOSIS — E1122 Type 2 diabetes mellitus with diabetic chronic kidney disease: Secondary | ICD-10-CM | POA: Diagnosis not present

## 2016-07-19 DIAGNOSIS — F329 Major depressive disorder, single episode, unspecified: Secondary | ICD-10-CM | POA: Diagnosis not present

## 2016-07-19 DIAGNOSIS — Z882 Allergy status to sulfonamides status: Secondary | ICD-10-CM | POA: Insufficient documentation

## 2016-07-19 DIAGNOSIS — Z8249 Family history of ischemic heart disease and other diseases of the circulatory system: Secondary | ICD-10-CM | POA: Insufficient documentation

## 2016-07-19 DIAGNOSIS — Z96659 Presence of unspecified artificial knee joint: Secondary | ICD-10-CM | POA: Insufficient documentation

## 2016-07-19 HISTORY — PX: CARDIAC CATHETERIZATION: SHX172

## 2016-07-19 HISTORY — DX: Other chronic pain: G89.29

## 2016-07-19 HISTORY — DX: Low back pain, unspecified: M54.50

## 2016-07-19 HISTORY — DX: Low back pain: M54.5

## 2016-07-19 HISTORY — DX: Unspecified rotator cuff tear or rupture of left shoulder, not specified as traumatic: M75.102

## 2016-07-19 SURGERY — LEFT HEART CATH AND CORONARY ANGIOGRAPHY
Anesthesia: Moderate Sedation | Laterality: Left

## 2016-07-19 MED ORDER — SODIUM CHLORIDE 0.9% FLUSH: 3.0000 mL | Freq: Two times a day (BID) | INTRAVENOUS | Status: DC

## 2016-07-19 MED ORDER — ASPIRIN 81 MG PO CHEW
CHEWABLE_TABLET | ORAL | Status: AC
  Administered 2016-07-19: 07:00:00
  Filled 2016-07-19: qty 1

## 2016-07-19 MED ORDER — MIDAZOLAM HCL 2 MG/2ML IJ SOLN
INTRAMUSCULAR | Status: AC
Start: 2016-07-19 — End: 2016-07-19
  Filled 2016-07-19: qty 2

## 2016-07-19 MED ORDER — HEPARIN (PORCINE) IN NACL 2-0.9 UNIT/ML-% IJ SOLN
INTRAMUSCULAR | Status: AC
  Filled 2016-07-19: qty 500

## 2016-07-19 MED ORDER — ASPIRIN 81 MG PO CHEW: 81.0000 mg | CHEWABLE_TABLET | ORAL | Status: DC

## 2016-07-19 MED ORDER — SODIUM CHLORIDE 0.9 % IV SOLN
INTRAVENOUS | Status: DC
  Administered 2016-07-19: 07:00:00 via INTRAVENOUS

## 2016-07-19 MED ORDER — FENTANYL CITRATE (PF) 100 MCG/2ML IJ SOLN
INTRAMUSCULAR | Status: AC
  Filled 2016-07-19: qty 2

## 2016-07-19 MED ORDER — SODIUM CHLORIDE 0.9 % IV SOLN: 250.0000 mL | INTRAVENOUS | Status: DC | PRN

## 2016-07-19 MED ORDER — FENTANYL CITRATE (PF) 100 MCG/2ML IJ SOLN
INTRAMUSCULAR | Status: DC | PRN
  Administered 2016-07-19: 12.5 ug via INTRAVENOUS

## 2016-07-19 MED ORDER — MIDAZOLAM HCL 2 MG/2ML IJ SOLN
INTRAMUSCULAR | Status: DC | PRN
  Administered 2016-07-19: 0.5 mg via INTRAVENOUS

## 2016-07-19 MED ORDER — IOPAMIDOL (ISOVUE-300) INJECTION 61%
INTRAVENOUS | Status: DC | PRN
  Administered 2016-07-19: 180 mL via INTRAVENOUS

## 2016-07-19 MED ORDER — SODIUM CHLORIDE 0.9 % WEIGHT BASED INFUSION: 3.0000 mL/kg/h | INTRAVENOUS | Status: DC

## 2016-07-19 MED ORDER — SODIUM CHLORIDE 0.9% FLUSH: 3.0000 mL | INTRAVENOUS | Status: DC | PRN

## 2016-07-19 MED ORDER — SODIUM CHLORIDE 0.9 % WEIGHT BASED INFUSION: 1.0000 mL/kg/h | INTRAVENOUS | Status: DC

## 2016-07-19 MED ORDER — ACETAMINOPHEN 325 MG PO TABS: 650.0000 mg | ORAL_TABLET | ORAL | Status: DC | PRN

## 2016-07-19 MED ORDER — ONDANSETRON HCL 4 MG/2ML IJ SOLN: 4.0000 mg | Freq: Four times a day (QID) | INTRAMUSCULAR | Status: DC | PRN

## 2016-07-19 SURGICAL SUPPLY — 9 items
CATH INFINITI 5FR ANG PIGTAIL (CATHETERS) ×3 IMPLANT
CATH INFINITI 5FR JL4 (CATHETERS) ×3 IMPLANT
CATH INFINITI JR4 5F (CATHETERS) ×3 IMPLANT
DEVICE CLOSURE MYNXGRIP 5F (Vascular Products) ×3 IMPLANT
GUIDEWIRE 3MM J TIP .035 145 (WIRE) ×6 IMPLANT
KIT MANI 3VAL PERCEP (MISCELLANEOUS) ×3 IMPLANT
NEEDLE PERC 18GX7CM (NEEDLE) ×3 IMPLANT
PACK CARDIAC CATH (CUSTOM PROCEDURE TRAY) ×3 IMPLANT
SHEATH PINNACLE 5F 10CM (SHEATH) ×3 IMPLANT

## 2016-07-19 NOTE — Discharge Instructions (Signed)

## 2016-07-22 ENCOUNTER — Ambulatory Visit: Payer: Medicare HMO | Admitting: Internal Medicine

## 2016-07-22 ENCOUNTER — Encounter: Payer: Self-pay | Admitting: *Deleted

## 2016-07-26 DIAGNOSIS — H8113 Benign paroxysmal vertigo, bilateral: Secondary | ICD-10-CM | POA: Insufficient documentation

## 2016-07-26 DIAGNOSIS — Z8673 Personal history of transient ischemic attack (TIA), and cerebral infarction without residual deficits: Secondary | ICD-10-CM | POA: Insufficient documentation

## 2016-07-29 ENCOUNTER — Encounter: Payer: Self-pay | Admitting: *Deleted

## 2016-08-03 DIAGNOSIS — I25118 Atherosclerotic heart disease of native coronary artery with other forms of angina pectoris: Secondary | ICD-10-CM | POA: Insufficient documentation

## 2016-08-27 ENCOUNTER — Emergency Department: Payer: Medicare HMO

## 2016-08-27 ENCOUNTER — Emergency Department
Admission: EM | Admit: 2016-08-27 | Discharge: 2016-08-27 | Disposition: A | Discharge status: Home / Self Care | Payer: Medicare HMO | Attending: Emergency Medicine | Admitting: Emergency Medicine

## 2016-08-27 ENCOUNTER — Encounter: Payer: Self-pay | Admitting: Emergency Medicine

## 2016-08-27 DIAGNOSIS — R42 Dizziness and giddiness: Secondary | ICD-10-CM | POA: Insufficient documentation

## 2016-08-27 DIAGNOSIS — Z951 Presence of aortocoronary bypass graft: Secondary | ICD-10-CM | POA: Diagnosis not present

## 2016-08-27 DIAGNOSIS — J45909 Unspecified asthma, uncomplicated: Secondary | ICD-10-CM | POA: Insufficient documentation

## 2016-08-27 DIAGNOSIS — I2510 Atherosclerotic heart disease of native coronary artery without angina pectoris: Secondary | ICD-10-CM | POA: Insufficient documentation

## 2016-08-27 DIAGNOSIS — W1839XA Other fall on same level, initial encounter: Secondary | ICD-10-CM | POA: Diagnosis not present

## 2016-08-27 DIAGNOSIS — Y92002 Bathroom of unspecified non-institutional (private) residence single-family (private) house as the place of occurrence of the external cause: Secondary | ICD-10-CM | POA: Insufficient documentation

## 2016-08-27 DIAGNOSIS — Z7982 Long term (current) use of aspirin: Secondary | ICD-10-CM | POA: Insufficient documentation

## 2016-08-27 DIAGNOSIS — S62525A Nondisplaced fracture of distal phalanx of left thumb, initial encounter for closed fracture: Secondary | ICD-10-CM | POA: Diagnosis not present

## 2016-08-27 DIAGNOSIS — Y999 Unspecified external cause status: Secondary | ICD-10-CM | POA: Diagnosis not present

## 2016-08-27 DIAGNOSIS — Z79899 Other long term (current) drug therapy: Secondary | ICD-10-CM | POA: Insufficient documentation

## 2016-08-27 DIAGNOSIS — E119 Type 2 diabetes mellitus without complications: Secondary | ICD-10-CM | POA: Insufficient documentation

## 2016-08-27 DIAGNOSIS — F17210 Nicotine dependence, cigarettes, uncomplicated: Secondary | ICD-10-CM | POA: Insufficient documentation

## 2016-08-27 DIAGNOSIS — Z791 Long term (current) use of non-steroidal anti-inflammatories (NSAID): Secondary | ICD-10-CM | POA: Diagnosis not present

## 2016-08-27 DIAGNOSIS — J449 Chronic obstructive pulmonary disease, unspecified: Secondary | ICD-10-CM | POA: Diagnosis not present

## 2016-08-27 DIAGNOSIS — I10 Essential (primary) hypertension: Secondary | ICD-10-CM | POA: Diagnosis not present

## 2016-08-27 DIAGNOSIS — Y939 Activity, unspecified: Secondary | ICD-10-CM | POA: Insufficient documentation

## 2016-08-27 DIAGNOSIS — S6992XA Unspecified injury of left wrist, hand and finger(s), initial encounter: Secondary | ICD-10-CM | POA: Diagnosis present

## 2016-08-27 LAB — COMPREHENSIVE METABOLIC PANEL
ALT: 9 U/L — AB (ref 14–54)
AST: 16 U/L (ref 15–41)
Albumin: 3.9 g/dL (ref 3.5–5.0)
Alkaline Phosphatase: 99 U/L (ref 38–126)
Anion gap: 13 (ref 5–15)
BUN: 61 mg/dL — AB (ref 6–20)
CHLORIDE: 103 mmol/L (ref 101–111)
CO2: 21 mmol/L — ABNORMAL LOW (ref 22–32)
CREATININE: 7.89 mg/dL — AB (ref 0.44–1.00)
Calcium: 8 mg/dL — ABNORMAL LOW (ref 8.9–10.3)
GFR calc Af Amer: 6 mL/min — ABNORMAL LOW (ref >60)
GFR, EST NON AFRICAN AMERICAN: 5 mL/min — AB (ref >60)
GLUCOSE: 95 mg/dL (ref 65–99)
POTASSIUM: 4.1 mmol/L (ref 3.5–5.1)
SODIUM: 137 mmol/L (ref 135–145)
Total Bilirubin: 0.6 mg/dL (ref 0.3–1.2)
Total Protein: 8.7 g/dL — ABNORMAL HIGH (ref 6.5–8.1)

## 2016-08-27 LAB — CBC WITH DIFFERENTIAL/PLATELET
Basophils Absolute: 0 10*3/uL (ref 0–0.1)
Basophils Relative: 0 %
EOS ABS: 0.3 10*3/uL (ref 0–0.7)
Eosinophils Relative: 4 %
HCT: 31.8 % — ABNORMAL LOW (ref 35.0–47.0)
HEMOGLOBIN: 10.6 g/dL — AB (ref 12.0–16.0)
LYMPHS ABS: 2.1 10*3/uL (ref 1.0–3.6)
Lymphocytes Relative: 29 %
MCH: 30.8 pg (ref 26.0–34.0)
MCHC: 33.4 g/dL (ref 32.0–36.0)
MCV: 92.3 fL (ref 80.0–100.0)
MONO ABS: 0.6 10*3/uL (ref 0.2–0.9)
MONOS PCT: 8 %
NEUTROS PCT: 59 %
Neutro Abs: 4.3 10*3/uL (ref 1.4–6.5)
Platelets: 193 10*3/uL (ref 150–440)
RBC: 3.45 MIL/uL — ABNORMAL LOW (ref 3.80–5.20)
RDW: 17.8 % — ABNORMAL HIGH (ref 11.5–14.5)
WBC: 7.3 10*3/uL (ref 3.6–11.0)

## 2016-08-27 LAB — TROPONIN I

## 2016-08-27 MED ORDER — HYDROCODONE-ACETAMINOPHEN 5-325 MG PO TABS
1.0000 | ORAL_TABLET | Freq: Once | ORAL | Status: DC
  Filled 2016-08-27: qty 1

## 2016-08-27 MED ORDER — MECLIZINE HCL 12.5 MG PO TABS
12.5000 mg | ORAL_TABLET | Freq: Three times a day (TID) | ORAL | 1 refills | Status: DC | PRN
Start: 2016-08-27 — End: 2018-03-20

## 2016-08-27 MED ORDER — MECLIZINE HCL 25 MG PO TABS
25.0000 mg | ORAL_TABLET | Freq: Once | ORAL | Status: AC
  Administered 2016-08-27: 25 mg via ORAL
  Filled 2016-08-27: qty 1

## 2016-08-27 MED ORDER — TRAMADOL HCL 50 MG PO TABS: 50.0000 mg | ORAL_TABLET | Freq: Four times a day (QID) | ORAL | 0 refills | Status: DC | PRN

## 2016-08-27 NOTE — Discharge Instructions (Signed)
Please return immediately if condition worsens. Please contact her primary physician or the physician you were given for referral. If you have any specialist physicians involved in her treatment and plan please also contact them. Thank you for using  [facility] emergency [facility].  Please continue with your follow-up with your dialysis on Monday or Tuesday. Fluid restrict over the rest of the weekend.

## 2016-08-27 NOTE — ED Triage Notes (Signed)
Patient comes to ED via POV c/o pain in her left thumb sp/ fall. Patient states that she had sudden onset of dizziness, patient denies weakness, changes in vision, or trouble with thoughts. Patient states that she fell landing on her left side. Patient denies hip pain.  Patient is dialysis patient, missed treatment today due to fall. Did complete full treatment on Thursday.

## 2016-08-27 NOTE — ED Provider Notes (Signed)
Time Seen: Approximately1638 I have reviewed the triage notes  Chief Complaint: Dizziness and Fall   History of Present Illness: Kimberly Shaffer is a 65 y.o. female who presents after a non-syncopal fall. Patient states she has history of vertigo and felt similar episodes in the past. She denies any loss of consciousness and states she fell in the bathroom and injured her left hand and points primarily to her left thumb. She states due to the pain and the fall that she missed her dialysis treatment today. She normally goes on Tuesday, Thursday, and Saturday and states she had a full treatment on Thursday. She has a fistula in the left upper extremity which is been easy to access.   Past Medical History:  Diagnosis Date  . Asthma   . CAD (coronary artery disease)   . Chronic lower back pain   . COPD (chronic obstructive pulmonary disease) ([place])   . Depression   . Diabetes mellitus without complication (HCC)    NIDD  . H/O angina pectoris   . H/O blood clots   . Hypercholesteremia   . Hypertension   . Left rotator cuff tear   . Myocardial infarction   . Obesity   . Renal insufficiency   . Stroke ([place])   . Vertigo, aural     Patient Active Problem List   Diagnosis Date Noted  . Stable angina ([place]) 07/05/2016  . Sepsis due to pneumonia ([place]) 07/05/2016  . Fracture of left foot 04/26/2016  . Chest pain at rest 12/17/2015  . Chest pain 12/17/2015  . DJD of shoulder 10/21/2015  . 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 Lumbar radiculopathy 05/14/2015  . 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 Lumbosacral facet joint syndrome 02/16/2015  . Greater trochanteric bursitis 02/16/2015  . DDD (degenerative disc disease), lumbosacral 01/20/2015  . 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 DJD (degenerative joint disease) shoulder 01/20/2015    Past Surgical History:  Procedure Laterality Date  . AV FISTULA PLACEMENT Left 11-04-2015  .  AV FISTULA PLACEMENT Left 11/04/2015   Procedure: ARTERIOVENOUS (AV) FISTULA CREATION  ( BRACHIAL CEPHALIC );  Surgeon: Katha Cabal, MD;  Location: [facility];  Service: Vascular;  Laterality: Left;  . CARDIAC CATHETERIZATION    . CARDIAC CATHETERIZATION Left 07/19/2016   Procedure: Left Heart Cath and Coronary Angiography;  Surgeon: Corey Skains, MD;  Location: [place] [facility];  Service: Cardiovascular;  Laterality: Left;  . CORONARY ARTERY BYPASS GRAFT  2012  . CORONARY STENT PLACEMENT    . JOINT REPLACEMENT    . KNEE SURGERY Bilateral   . PERIPHERAL VASCULAR CATHETERIZATION N/A 11/13/2015   Procedure: Dialysis/Perma Catheter Insertion;  Surgeon: Katha Cabal, MD;  Location: [place] [facility];  Service: Cardiovascular;  Laterality: N/A;  . PERIPHERAL VASCULAR CATHETERIZATION N/A 12/04/2015   Procedure: Dialysis/Perma Catheter Insertion;  Surgeon: Katha Cabal, MD;  Location: [place] [facility];  Service: Cardiovascular;  Laterality: N/A;  . PERIPHERAL VASCULAR CATHETERIZATION Left 12/31/2015   Procedure: A/V Shuntogram/Fistulagram;  Surgeon: Algernon Huxley, MD;  Location: [place] [facility];  Service: Cardiovascular;  Laterality: Left;  . PERIPHERAL VASCULAR CATHETERIZATION N/A 12/31/2015   Procedure: A/V Shunt Intervention;  Surgeon: Algernon Huxley, MD;  Location: [place] [facility];  Service: Cardiovascular;  Laterality: N/A;  . PERIPHERAL VASCULAR CATHETERIZATION Left 02/25/2016   Procedure: A/V Shuntogram/Fistulagram;  Surgeon: Algernon Huxley, MD;  Location: [place] [facility]  [facility];  Service: Cardiovascular;  Laterality: Left;  . PERIPHERAL VASCULAR CATHETERIZATION N/A 03/18/2016   Procedure: Dialysis/Perma Catheter Removal;  Surgeon: Katha Cabal, MD;  Location: [place] [facility];  Service: Cardiovascular;  Laterality: N/A;  . REPLACEMENT TOTAL KNEE BILATERAL Bilateral     Past Surgical History:  Procedure Laterality Date  . AV FISTULA PLACEMENT Left 11-04-2015   . AV FISTULA PLACEMENT Left 11/04/2015   Procedure: ARTERIOVENOUS (AV) FISTULA CREATION  ( BRACHIAL CEPHALIC );  Surgeon: Katha Cabal, MD;  Location: [facility];  Service: Vascular;  Laterality: Left;  . CARDIAC CATHETERIZATION    . CARDIAC CATHETERIZATION Left 07/19/2016   Procedure: Left Heart Cath and Coronary Angiography;  Surgeon: Corey Skains, MD;  Location: [place] [facility];  Service: Cardiovascular;  Laterality: Left;  . CORONARY ARTERY BYPASS GRAFT  2012  . CORONARY STENT PLACEMENT    . JOINT REPLACEMENT    . KNEE SURGERY Bilateral   . PERIPHERAL VASCULAR CATHETERIZATION N/A 11/13/2015   Procedure: Dialysis/Perma Catheter Insertion;  Surgeon: Katha Cabal, MD;  Location: [place] [facility];  Service: Cardiovascular;  Laterality: N/A;  . PERIPHERAL VASCULAR CATHETERIZATION N/A 12/04/2015   Procedure: Dialysis/Perma Catheter Insertion;  Surgeon: Katha Cabal, MD;  Location: [place] [facility];  Service: Cardiovascular;  Laterality: N/A;  . PERIPHERAL VASCULAR CATHETERIZATION Left 12/31/2015   Procedure: A/V Shuntogram/Fistulagram;  Surgeon: Algernon Huxley, MD;  Location: [place] [facility];  Service: Cardiovascular;  Laterality: Left;  . PERIPHERAL VASCULAR CATHETERIZATION N/A 12/31/2015   Procedure: A/V Shunt Intervention;  Surgeon: Algernon Huxley, MD;  Location: [place] [facility];  Service: Cardiovascular;  Laterality: N/A;  . PERIPHERAL VASCULAR CATHETERIZATION Left 02/25/2016   Procedure: A/V Shuntogram/Fistulagram;  Surgeon: Algernon Huxley, MD;  Location: [place] [facility];  Service: Cardiovascular;  Laterality: Left;  . PERIPHERAL VASCULAR CATHETERIZATION N/A 03/18/2016   Procedure: Dialysis/Perma Catheter Removal;  Surgeon: Katha Cabal, MD;  Location: [place] [facility];  Service: Cardiovascular;  Laterality: N/A;  . REPLACEMENT TOTAL KNEE BILATERAL Bilateral     Current Outpatient Rx  . Order #: [id number] Class: Historical Med  . Order #:  [id number] Class: Historical Med  . Order #: [id number] Class: Historical Med  . Order #: [id number] Class: Historical Med  . Order #: [id number] Class: Historical Med  . Order #: [id number] Class: Historical Med  . Order #: [id number] Class: Print  . Order #: [id number] Class: Historical Med  . Order #: [id number] Class: Normal  . Order #: [id number] Class: Historical Med  . Order #: [id number] Class: Historical Med  . Order #: [id number] Class: Print  . Order #: [id number] Class: Historical Med  . Order #: [id number] Class: Historical Med  . Order #: [id number] Class: Historical Med  . Order #: [id number] Class: Historical Med  . Order #: [id number] Class: Historical Med  . Order #: [id number] Class: Historical Med  . Order #: [id number] Class: Print  . Order #: [id number] Class: Historical Med  . Order #: [id number] Class: Historical Med  . Order #: [id number] Class: Historical Med  . Order #: [id number] Class: Historical Med    Allergies:  Nystatin; Sulfa antibiotics; and Niaspan [niacin]  Family History: Family History  Problem Relation Age of Onset  . Cancer Mother   . Cancer Father   . Diabetes Brother   . Heart disease Brother     Social History: Social History  Substance Use Topics  . Smoking status: Current Every Day Smoker    Packs/day: 0.50    Types: Cigarettes  . Smokeless tobacco: Never Used  .  Alcohol use No     Review of Systems:   10 point review of systems was performed and was otherwise negative:  Constitutional: No fever Eyes: No visual disturbances ENT: No sore throat, ear pain Cardiac: No chest pain Respiratory: No shortness of breath, wheezing, or stridor Abdomen: No abdominal pain, no vomiting, No diarrhea Endocrine: No weight loss, No night sweats Extremities: No peripheral edema, cyanosis Skin: No rashes, easy bruising Neurologic: No focal weakness, trouble with speech or swollowing Urologic: No dysuria, Hematuria, or urinary frequency *Patient denies any head trauma or  loss of consciousness she denies any chest pain or abdominal pain or shortness of breath at this time. Primarily left upper extremity pain especially in the left thumb and second and third digits.  Physical Exam:  ED Triage Vitals  Enc Vitals Group     BP 08/27/16 1557 (!) 145/63     Pulse Rate 08/27/16 1557 75     Resp 08/27/16 1557 16     Temp 08/27/16 1557 98.3 F (36.8 C)     Temp Source 08/27/16 1557 Oral     SpO2 08/27/16 1557 97 %     Weight 08/27/16 1558 219 lb (99.3 kg)     Height 08/27/16 1558 5\' 6"  (1.676 m)     Head Circumference --      Peak Flow --      Pain Score 08/27/16 1558 10     Pain Loc --      Pain Edu? --      Excl. in [place]? --     General: Awake , Alert , and Oriented times 3; GCS 15 Dramatic historian Head: Normal cephalic , atraumatic Eyes: Pupils equal , round, reactive to light Nose/Throat: No nasal drainage, patent upper airway without erythema or exudate.  Neck: Supple, Full range of motion, No anterior adenopathy or palpable thyroid masses Lungs: Clear to ascultation without wheezes , rhonchi, or rales Heart: Regular rate, regular rhythm without murmurs , gallops , or rubs Abdomen: Soft, non tender without rebound, guarding , or rigidity; bowel sounds positive and symmetric in all 4 quadrants. No organomegaly .        Extremities: Patient has a contusion over the anterior phalangeal joint of her left thumb. No obvious bony abnormalities and no significant injuries cited in the second and third digit on that left hand. Neurologic: normal ambulation, Motor symmetric without deficits, sensory intact Skin: warm, dry, no rashes   Labs:   All laboratory work was reviewed including any pertinent negatives or positives listed below:  Labs Reviewed  CBC WITH DIFFERENTIAL/PLATELET  COMPREHENSIVE METABOLIC PANEL  TROPONIN I    EKG:  ED ECG REPORT I, Daymon Larsen, the attending physician, personally viewed and interpreted this ECG.  Date:  08/27/2016 EKG Time: 1626Rate: 76 Rhythm: normal sinus rhythm QRS Axis: normal Intervals: Prolonged QT interval ST/T Wave abnormalities: Nonspecific T wave abnormality Narrative Interpretation: unremarkable prolonged QT interval No acute ischemic changes  Radiology: * "Dg Chest 2 View  Result Date: 08/27/2016 CLINICAL DATA:  Recent fall and dizziness EXAM: CHEST  2 VIEW COMPARISON:  07/07/2016 FINDINGS: Cardiac shadow is mildly enlarged. The lungs are well aerated bilaterally. Mild chronic interstitial changes are noted bilaterally. No acute bony abnormality is seen. No sizable effusion is noted. IMPRESSION: No acute abnormality noted. Electronically Signed   By: Inez Catalina M.D.   On: 08/27/2016 17:10   Dg Hand 2 View Left  Result Date: 08/27/2016 CLINICAL DATA:  Hand pain  following fall, initial encounter EXAM: LEFT HAND - 2 VIEW COMPARISON:  None. FINDINGS: Fragmentation of the distal aspect of the first distal phalanx is noted. Correlation to point tenderness is recommended this may be related to acute on chronic trauma. No other fracture or dislocation is seen. Mild degenerative changes of the interphalangeal joints are seen. : Fragmentation of the first distal phalangeal tuft. The appearance suggests acute on chronic trauma. Electronically Signed   By: Inez Catalina M.D.   On: 08/27/2016 17:12  "  I personally reviewed the radiologic studies    ED Course:  Patient has a history of renal failure missed dialysis today but does not seem to require any emergency dialysis at this time. She agrees to follow up with her normal dialysis schedule and Tuesday and was advised she can always call Monday if she feels that he needs to have her dialysis done sooner. She was advised to restrict activity and her potassium is normal along her chest x-ray which shows no indications of pulmonary edema. Her x-ray shows what appears to be a tuft fracture on her left thumb and she was placed in a thumb spica  splint for stability purposes and I felt would better protect  , And she was referred to orthopedics unassigned. Her dizziness improved with oral Antivert here in emergency department and she was discharged with prescription for Antivert. I felt her vertiginous symptoms seem to be very brief and essentially resolved was unlikely to be central vertigo at this time Clinical Course      Assessment: * Peripheral vertigo History of renal failure Possible tuft fracture left from     Plan:  Outpatient " Discharge Medication List as of 08/27/2016  5:46 PM    " Patient was advised to return immediately if condition worsens. Patient was advised to follow up with their primary care physician or other specialized physicians involved in their outpatient care. The patient and/or family member/power of attorney had laboratory results reviewed at the bedside. All questions and concerns were addressed and appropriate discharge instructions were distributed by the nursing staff.             Daymon Larsen, MD 08/27/16 2218

## 2016-08-27 NOTE — ED Notes (Signed)
Thumb spica applied by Colletta [place], Nt

## 2016-08-30 DIAGNOSIS — Z01818 Encounter for other preprocedural examination: Secondary | ICD-10-CM | POA: Insufficient documentation

## 2016-08-30 DIAGNOSIS — M7989 Other specified soft tissue disorders: Secondary | ICD-10-CM | POA: Insufficient documentation

## 2016-09-03 DIAGNOSIS — M86642 Other chronic osteomyelitis, left hand: Secondary | ICD-10-CM | POA: Insufficient documentation

## 2016-09-09 LAB — CULTURE, BLOOD (ROUTINE X 2)
CULTURE: NO GROWTH
Culture: NO GROWTH

## 2016-10-21 ENCOUNTER — Other Ambulatory Visit (INDEPENDENT_AMBULATORY_CARE_PROVIDER_SITE_OTHER): Payer: Self-pay | Admitting: Vascular Surgery

## 2016-10-21 DIAGNOSIS — T829XXA Unspecified complication of cardiac and vascular prosthetic device, implant and graft, initial encounter: Secondary | ICD-10-CM

## 2016-10-21 DIAGNOSIS — N185 Chronic kidney disease, stage 5: Secondary | ICD-10-CM

## 2016-10-24 ENCOUNTER — Ambulatory Visit (INDEPENDENT_AMBULATORY_CARE_PROVIDER_SITE_OTHER): Payer: Self-pay | Admitting: Vascular Surgery

## 2016-10-24 ENCOUNTER — Encounter (INDEPENDENT_AMBULATORY_CARE_PROVIDER_SITE_OTHER): Payer: Medicare HMO

## 2016-10-31 DIAGNOSIS — E1140 Type 2 diabetes mellitus with diabetic neuropathy, unspecified: Secondary | ICD-10-CM | POA: Insufficient documentation

## 2016-10-31 DIAGNOSIS — B351 Tinea unguium: Secondary | ICD-10-CM | POA: Insufficient documentation

## 2016-10-31 DIAGNOSIS — L602 Onychogryphosis: Secondary | ICD-10-CM | POA: Insufficient documentation

## 2016-10-31 DIAGNOSIS — M898X9 Other specified disorders of bone, unspecified site: Secondary | ICD-10-CM | POA: Insufficient documentation

## 2016-11-04 DIAGNOSIS — I214 Non-ST elevation (NSTEMI) myocardial infarction: Secondary | ICD-10-CM | POA: Insufficient documentation

## 2016-12-07 ENCOUNTER — Encounter (INDEPENDENT_AMBULATORY_CARE_PROVIDER_SITE_OTHER): Payer: Medicare HMO

## 2016-12-07 ENCOUNTER — Ambulatory Visit (INDEPENDENT_AMBULATORY_CARE_PROVIDER_SITE_OTHER): Payer: Medicare HMO | Admitting: Vascular Surgery

## 2017-01-02 ENCOUNTER — Encounter (INDEPENDENT_AMBULATORY_CARE_PROVIDER_SITE_OTHER): Payer: Medicare HMO

## 2017-01-02 ENCOUNTER — Ambulatory Visit (INDEPENDENT_AMBULATORY_CARE_PROVIDER_SITE_OTHER): Payer: Medicare HMO | Admitting: Vascular Surgery

## 2017-02-01 ENCOUNTER — Encounter (INDEPENDENT_AMBULATORY_CARE_PROVIDER_SITE_OTHER): Payer: Medicare HMO

## 2017-02-01 ENCOUNTER — Ambulatory Visit (INDEPENDENT_AMBULATORY_CARE_PROVIDER_SITE_OTHER): Payer: Medicare HMO | Admitting: Vascular Surgery

## 2017-03-08 ENCOUNTER — Ambulatory Visit (INDEPENDENT_AMBULATORY_CARE_PROVIDER_SITE_OTHER): Payer: Medicare HMO | Admitting: Vascular Surgery

## 2017-03-08 ENCOUNTER — Encounter (INDEPENDENT_AMBULATORY_CARE_PROVIDER_SITE_OTHER): Payer: Self-pay

## 2017-03-08 ENCOUNTER — Encounter (INDEPENDENT_AMBULATORY_CARE_PROVIDER_SITE_OTHER): Payer: Self-pay | Admitting: Vascular Surgery

## 2017-03-08 ENCOUNTER — Ambulatory Visit (INDEPENDENT_AMBULATORY_CARE_PROVIDER_SITE_OTHER): Payer: Medicare HMO

## 2017-03-08 VITALS — BP 127/67 | HR 55 | Resp 16 | Wt 219.0 lb

## 2017-03-08 DIAGNOSIS — T829XXA Unspecified complication of cardiac and vascular prosthetic device, implant and graft, initial encounter: Secondary | ICD-10-CM

## 2017-03-08 DIAGNOSIS — I6523 Occlusion and stenosis of bilateral carotid arteries: Secondary | ICD-10-CM | POA: Insufficient documentation

## 2017-03-08 DIAGNOSIS — N185 Chronic kidney disease, stage 5: Secondary | ICD-10-CM | POA: Diagnosis not present

## 2017-03-08 DIAGNOSIS — N186 End stage renal disease: Secondary | ICD-10-CM

## 2017-03-08 DIAGNOSIS — Z992 Dependence on renal dialysis: Secondary | ICD-10-CM | POA: Diagnosis not present

## 2017-03-08 NOTE — Progress Notes (Signed)
Subjective:    Patient ID: Kimberly Shaffer, female    DOB: [birth year], 66 y.o.   MRN: [id number] Chief Complaint  Patient presents with  . Follow-up   Patient presents for 6 month HD follow-up. The patient presents today without complaints with exception of recent issues with cannulation. The patient underwent a left upper extremity HDA which was notable for an area of narrowing mid fistula (552), nonocclusive thrombus seen at the proximal upper left cephalic vein, hematoma noted at the distal upper arm proximal to the anastomosis measuring 0.8 cm x 0.5 cm x 0.7 cm. The patient denies any left upper extremity pain. Her skin is intact. Patient is concerned about plaque buildup in her carotid arteries. She recently read a magazine which promoted carotid stenosis screenings. Patient endorses a past medical history of stroke in 32. She denies any amaurosis fugax or medical deficits. Patient denies any fever, nausea or vomiting.    Review of Systems  Constitutional: Negative.   HENT: Negative.   Eyes: Negative.   Respiratory: Negative.   Cardiovascular: Negative.   Gastrointestinal: Negative.   Endocrine: Negative.   Genitourinary: Negative.   Musculoskeletal: Negative.   Skin: Negative.   Allergic/Immunologic: Negative.   Neurological: Negative.   Hematological: Negative.   Psychiatric/Behavioral: Negative.       Objective:   Physical Exam  Constitutional: She is oriented to person, place, and time. She appears well-developed and well-nourished. No distress.  HENT:  Head: Normocephalic and atraumatic.  Eyes: Conjunctivae are normal. Pupils are equal, round, and reactive to light.  Neck: Normal range of motion.  No carotid bruits noted  Cardiovascular: Normal rate, regular rhythm, normal heart sounds and intact distal pulses.   Pulses:      Radial pulses are 2+ on the right side, and 2+ on the left side.  Left upper extremity dialysis access: Good bruit and thrill    Pulmonary/Chest: Effort normal.  Musculoskeletal: Normal range of motion. She exhibits no edema.  Neurological: She is alert and oriented to person, place, and time.  Skin: Skin is warm and dry. She is not diaphoretic.  Psychiatric: She has a normal mood and affect. Her behavior is normal. Judgment and thought content normal.    BP 127/67   Pulse (!) 55   Resp 16   Wt 219 lb (99.3 kg)   BMI 35.35 kg/m   Past Medical History:  Diagnosis Date  . Asthma   . CAD (coronary artery disease)   . Chronic lower back pain   . COPD (chronic obstructive pulmonary disease) ([place])   . Depression   . Diabetes mellitus without complication (HCC)    NIDD  . H/O angina pectoris   . H/O blood clots   . Hypercholesteremia   . Hypertension   . Left rotator cuff tear   . Myocardial infarction ([place])   . Obesity   . Renal insufficiency   . Stroke ([place])   . Vertigo, aural     Social History   Social History  . Marital status: Divorced    Spouse name: N/A  . Number of children: N/A  . Years of education: N/A   Occupational History  . Not on file.   Social History Main Topics  . Smoking status: Current Every Day Smoker    Packs/day: 0.50    Types: Cigarettes  . Smokeless tobacco: Never Used  . Alcohol use No  . Drug use: No  . Sexual activity: Yes   Other Topics Concern  .  Not on file   Social History Narrative  . No narrative on file    Past Surgical History:  Procedure Laterality Date  . AV FISTULA PLACEMENT Left 11-04-2015  . AV FISTULA PLACEMENT Left 11/04/2015   Procedure: ARTERIOVENOUS (AV) FISTULA CREATION  ( BRACHIAL CEPHALIC );  Surgeon: Katha Cabal, MD;  Location: [facility];  Service: Vascular;  Laterality: Left;  . CARDIAC CATHETERIZATION    . CARDIAC CATHETERIZATION Left 07/19/2016   Procedure: Left Heart Cath and Coronary Angiography;  Surgeon: Corey Skains, MD;  Location: [place] [facility];  Service: Cardiovascular;  Laterality: Left;  . CORONARY  ARTERY BYPASS GRAFT  2012  . CORONARY STENT PLACEMENT    . JOINT REPLACEMENT    . KNEE SURGERY Bilateral   . PERIPHERAL VASCULAR CATHETERIZATION N/A 11/13/2015   Procedure: Dialysis/Perma Catheter Insertion;  Surgeon: Katha Cabal, MD;  Location: [place] [facility];  Service: Cardiovascular;  Laterality: N/A;  . PERIPHERAL VASCULAR CATHETERIZATION N/A 12/04/2015   Procedure: Dialysis/Perma Catheter Insertion;  Surgeon: Katha Cabal, MD;  Location: [place] [facility];  Service: Cardiovascular;  Laterality: N/A;  . PERIPHERAL VASCULAR CATHETERIZATION Left 12/31/2015   Procedure: A/V Shuntogram/Fistulagram;  Surgeon: Algernon Huxley, MD;  Location: [place] [facility];  Service: Cardiovascular;  Laterality: Left;  . PERIPHERAL VASCULAR CATHETERIZATION N/A 12/31/2015   Procedure: A/V Shunt Intervention;  Surgeon: Algernon Huxley, MD;  Location: [place] [facility];  Service: Cardiovascular;  Laterality: N/A;  . PERIPHERAL VASCULAR CATHETERIZATION Left 02/25/2016   Procedure: A/V Shuntogram/Fistulagram;  Surgeon: Algernon Huxley, MD;  Location: [place] [facility];  Service: Cardiovascular;  Laterality: Left;  . PERIPHERAL VASCULAR CATHETERIZATION N/A 03/18/2016   Procedure: Dialysis/Perma Catheter Removal;  Surgeon: Katha Cabal, MD;  Location: [place] [facility];  Service: Cardiovascular;  Laterality: N/A;  . REPLACEMENT TOTAL KNEE BILATERAL Bilateral     Family History  Problem Relation Age of Onset  . Cancer Mother   . Cancer Father   . Diabetes Brother   . Heart disease Brother     Allergies  Allergen Reactions  . Nystatin Hives and Itching  . Sulfa Antibiotics Swelling, Hives and Rash  . Niaspan [Niacin] Other (See Comments) and Hives       Assessment & Plan:  Patient presents for 6 month HD follow-up. The patient presents today without complaints with exception of recent issues with cannulation. The patient underwent a left upper extremity HDA which was notable for an area  of narrowing mid fistula (552), nonocclusive thrombus seen at the proximal upper left cephalic vein, hematoma noted at the distal upper arm proximal to the anastomosis measuring 0.8 cm x 0.5 cm x 0.7 cm. The patient denies any left upper extremity pain. Her skin is intact. Patient is concerned about plaque buildup in her carotid arteries. She recently read a magazine which promoted carotid stenosis screenings. Patient endorses a past medical history of stroke in 49. She denies any amaurosis fugax or medical deficits. Patient denies any fever, nausea or vomiting.  1. Bilateral carotid artery stenosis - stable Patient with multiple risk factors for carotid stenosis Patient does not endorse a history of any type of baseline carotid duplex She has a past medical history of stroke in 1991 Given her risk factors and history recommend baseline carotid duplex. This can be done at her convenience  - VAS US CAROTID; Future  2. End stage renal disease on dialysis [facility]) - worsening Patient underwent a  6 month dialysis access follow-up today which was notable for an area mid fistula with significant narrowing. Patient does endorse recent issues with cannulation. An effort to keep her fistula running, would recommend a left upper extremity fistulogram Procedure, risks and benefits explained to the patient All questions answered Patient wishes to proceed  Current Outpatient Prescriptions on File Prior to Visit  Medication Sig Dispense Refill  . albuterol (PROVENTIL HFA;VENTOLIN HFA) 108 (90 BASE) MCG/ACT inhaler Inhale into the lungs every 4 (four) hours as needed for wheezing or shortness of breath. 2-4 puffs    . amLODipine (NORVASC) 10 MG tablet Take 10 mg by mouth daily.    Marland Kitchen aspirin EC 81 MG tablet Take 81 mg by mouth daily.    Marland Kitchen atorvastatin (LIPITOR) 80 MG tablet Take 80 mg by mouth at bedtime.     . calcium acetate (PHOSLO) 667 MG capsule Take 1,334 mg by mouth 3 (three) times daily.    .  calcium carbonate (OSCAL) 1500 (600 Ca) MG TABS tablet Take 600 mg of elemental calcium by mouth daily with breakfast.    . diclofenac (FLECTOR) 1.3 % PTCH Apply one patch for a portion of patch to painful area of skin for strain, sprain, and spasms of the upper back and shoulder twice per day if tolerated 60 patch 0  . DULoxetine (CYMBALTA) 60 MG capsule Take 60 mg by mouth daily.    . folic acid (FOLVITE) 1 MG tablet Take 1 mg by mouth daily.    . hydrALAZINE (APRESOLINE) 50 MG tablet Take 50 mg by mouth 3 (three) times daily.    Marland Kitchen ipratropium (ATROVENT) 0.02 % nebulizer solution Take 2.5 mLs (0.5 mg total) by nebulization every 6 (six) hours as needed for wheezing or shortness of breath. 125 mL 5  . isosorbide mononitrate (IMDUR) 30 MG 24 hr tablet Take 30 mg by mouth daily.    Marland Kitchen lubiprostone (AMITIZA) 24 MCG capsule Take 24 mcg by mouth daily as needed for constipation.    . meclizine (ANTIVERT) 12.5 MG tablet Take 1 tablet (12.5 mg total) by mouth 3 (three) times daily as needed for dizziness or nausea. 30 tablet 1  . methocarbamol (ROBAXIN) 750 MG tablet Take 750 mg by mouth daily as needed for muscle spasms.    . metoprolol succinate (TOPROL-XL) 100 MG 24 hr tablet Take 100 mg by mouth daily. Take with or immediately following a meal.    . nitroGLYCERIN (NITROLINGUAL) 0.4 MG/SPRAY spray Place 1 spray under the tongue every 5 (five) minutes x 3 doses as needed for chest pain.    . Oxycodone HCl 10 MG TABS Take 10 mg by mouth 4 (four) times daily as needed. For pain.    . pantoprazole (PROTONIX) 40 MG tablet Take 40 mg by mouth 2 (two) times daily.    . Pramoxine-Dimethicone 1-6 % CREA Place 1 application rectally 2 (two) times daily as needed (itching, burning, irritation, or other rectal discomfort caused by hemorrhoids).     . SENSIPAR 30 MG tablet Take 30 mg by mouth daily.    . traMADol (ULTRAM) 50 MG tablet Take 1 tablet (50 mg total) by mouth every 6 (six) hours as needed. 20 tablet 0  .  Fluticasone-Salmeterol (ADVAIR DISKUS) 250-50 MCG/DOSE AEPB Inhale 1 puff into the lungs 2 (two) times daily. 60 each 5  . oxyCODONE (ROXICODONE) 5 MG immediate release tablet Take 1 tablet (5 mg total) by mouth every 8 (eight) hours as needed. (Patient not taking:  Reported on 07/18/2016) 20 tablet 0   Current Facility-Administered Medications on File Prior to Visit  Medication Dose Route Frequency Provider Last Rate Last Dose  . triamcinolone acetonide (KENALOG-40) injection 40 mg  40 mg Other Once Mohammed Kindle, MD      . triamcinolone acetonide (KENALOG-40) injection 40 mg  40 mg Other Once Mohammed Kindle, MD        There are no Patient Instructions on file for this visit. No Follow-up on file.   Landrum Carbonell A Lorre Opdahl, PA-C

## 2017-03-08 NOTE — Progress Notes (Deleted)
[facility] Consultation      Assessment and Plan:     Date: 03/08/2017  MRN# [id number] Kimberly Shaffer 04-14-1951  Referring Physician:   BERYLE BAGSBY is a 66 y.o. old female seen in consultation for chief complaint of:   No chief complaint on file.   HPI:  Kimberly Shaffer is a 66 y.o. female with a known history of End-stage renal disease on dialysis, hypertension, COPD, diabetes and MI.    I personally reviewed. Chest x-ray images, most recently from 08/27/16, this showed mild hyperinflation consistent with emphysema. There is also mild cardiomegaly, left rib fractures, possibly from previous thoracotomy. CT scan suggestive of pulmonary edema.  Cardiac catheterization performed on 07/01/16: left ventricular function with ejection fraction of 40% with inferior hypokinesis Echo 12/17/15; Normal EF.   PMHX:   Past Medical History:  Diagnosis Date  . Asthma   . CAD (coronary artery disease)   . Chronic lower back pain   . COPD (chronic obstructive pulmonary disease) ([place])   . Depression   . Diabetes mellitus without complication (HCC)    NIDD  . H/O angina pectoris   . H/O blood clots   . Hypercholesteremia   . Hypertension   . Left rotator cuff tear   . Myocardial infarction   . Obesity   . Renal insufficiency   . Stroke ([place])   . Vertigo, aural    Surgical Hx:  Past Surgical History:  Procedure Laterality Date  . AV FISTULA PLACEMENT Left 11-04-2015  . AV FISTULA PLACEMENT Left 11/04/2015   Procedure: ARTERIOVENOUS (AV) FISTULA CREATION  ( BRACHIAL CEPHALIC );  Surgeon: Katha Cabal, MD;  Location: [facility];  Service: Vascular;  Laterality: Left;  . CARDIAC CATHETERIZATION    . CARDIAC CATHETERIZATION Left 07/19/2016   Procedure: Left Heart Cath and Coronary Angiography;  Surgeon: Corey Skains, MD;  Location: [facility];  Service: Cardiovascular;  Laterality: Left;  . CORONARY ARTERY BYPASS GRAFT  2012  . CORONARY STENT  PLACEMENT    . JOINT REPLACEMENT    . KNEE SURGERY Bilateral   . PERIPHERAL VASCULAR CATHETERIZATION N/A 11/13/2015   Procedure: Dialysis/Perma Catheter Insertion;  Surgeon: Katha Cabal, MD;  Location: [facility];  Service: Cardiovascular;  Laterality: N/A;  . PERIPHERAL VASCULAR CATHETERIZATION N/A 12/04/2015   Procedure: Dialysis/Perma Catheter Insertion;  Surgeon: Katha Cabal, MD;  Location: [place] [facility];  Service: Cardiovascular;  Laterality: N/A;  . PERIPHERAL VASCULAR CATHETERIZATION Left 12/31/2015   Procedure: A/V Shuntogram/Fistulagram;  Surgeon: Algernon Huxley, MD;  Location: [facility];  Service: Cardiovascular;  Laterality: Left;  . PERIPHERAL VASCULAR CATHETERIZATION N/A 12/31/2015   Procedure: A/V Shunt Intervention;  Surgeon: Algernon Huxley, MD;  Location: [place];  Service: Cardiovascular;  Laterality: N/A;  . PERIPHERAL VASCULAR CATHETERIZATION Left 02/25/2016   Procedure: A/V Shuntogram/Fistulagram;  Surgeon: Algernon Huxley, MD;  Location: [facility] [place] [facility];  Service: Cardiovascular;  Laterality: Left;  . PERIPHERAL VASCULAR CATHETERIZATION N/A 03/18/2016   Procedure: Dialysis/Perma Catheter Removal;  Surgeon: Katha Cabal, MD;  Location: [facility];  Service: Cardiovascular;  Laterality: N/A;  . REPLACEMENT TOTAL KNEE BILATERAL Bilateral    Family Hx:  Family History  Problem Relation Age of Onset  . Cancer Mother   . Cancer Father   . Diabetes Brother   . Heart disease Brother    Social Hx:   Social History  Substance  Use Topics  . Smoking status: Current Every Day Smoker    Packs/day: 0.50    Types: Cigarettes  . Smokeless tobacco: Never Used  . Alcohol use No   Medication:    Current Outpatient Prescriptions:  .  albuterol (PROVENTIL HFA;VENTOLIN HFA) 108 (90 BASE) MCG/ACT inhaler, Inhale into the lungs every 4 (four) hours as needed for wheezing or shortness of breath. 2-4 puffs, Disp: , Rfl:  .   amLODipine (NORVASC) 10 MG tablet, Take 10 mg by mouth daily., Disp: , Rfl:  .  aspirin EC 81 MG tablet, Take 81 mg by mouth daily., Disp: , Rfl:  .  atorvastatin (LIPITOR) 80 MG tablet, Take 80 mg by mouth at bedtime. , Disp: , Rfl:  .  calcium acetate (PHOSLO) 667 MG capsule, Take 1,334 mg by mouth 3 (three) times daily., Disp: , Rfl:  .  calcium carbonate (OSCAL) 1500 (600 Ca) MG TABS tablet, Take 600 mg of elemental calcium by mouth daily with breakfast., Disp: , Rfl:  .  diclofenac (FLECTOR) 1.3 % PTCH, Apply one patch for a portion of patch to painful area of skin for strain, sprain, and spasms of the upper back and shoulder twice per day if tolerated (Patient not taking: Reported on 07/18/2016), Disp: 60 patch, Rfl: 0 .  DULoxetine (CYMBALTA) 60 MG capsule, Take 60 mg by mouth daily., Disp: , Rfl:  .  Fluticasone-Salmeterol (ADVAIR DISKUS) 250-50 MCG/DOSE AEPB, Inhale 1 puff into the lungs 2 (two) times daily., Disp: 60 each, Rfl: 5 .  folic acid (FOLVITE) 1 MG tablet, Take 1 mg by mouth daily., Disp: , Rfl:  .  hydrALAZINE (APRESOLINE) 50 MG tablet, Take 50 mg by mouth 3 (three) times daily., Disp: , Rfl:  .  ipratropium (ATROVENT) 0.02 % nebulizer solution, Take 2.5 mLs (0.5 mg total) by nebulization every 6 (six) hours as needed for wheezing or shortness of breath., Disp: 125 mL, Rfl: 5 .  isosorbide mononitrate (IMDUR) 30 MG 24 hr tablet, Take 30 mg by mouth daily., Disp: , Rfl:  .  lubiprostone (AMITIZA) 24 MCG capsule, Take 24 mcg by mouth daily as needed for constipation., Disp: , Rfl:  .  meclizine (ANTIVERT) 12.5 MG tablet, Take 1 tablet (12.5 mg total) by mouth 3 (three) times daily as needed for dizziness or nausea., Disp: 30 tablet, Rfl: 1 .  methocarbamol (ROBAXIN) 750 MG tablet, Take 750 mg by mouth daily as needed for muscle spasms., Disp: , Rfl:  .  metoprolol succinate (TOPROL-XL) 100 MG 24 hr tablet, Take 100 mg by mouth daily. Take with or immediately following a meal., Disp:  , Rfl:  .  nitroGLYCERIN (NITROLINGUAL) 0.4 MG/SPRAY spray, Place 1 spray under the tongue every 5 (five) minutes x 3 doses as needed for chest pain., Disp: , Rfl:  .  oxyCODONE (ROXICODONE) 5 MG immediate release tablet, Take 1 tablet (5 mg total) by mouth every 8 (eight) hours as needed. (Patient not taking: Reported on 07/18/2016), Disp: 20 tablet, Rfl: 0 .  Oxycodone HCl 10 MG TABS, Take 10 mg by mouth 4 (four) times daily as needed. For pain., Disp: , Rfl:  .  pantoprazole (PROTONIX) 40 MG tablet, Take 40 mg by mouth 2 (two) times daily., Disp: , Rfl:  .  Pramoxine-Dimethicone 1-6 % CREA, Place 1 application rectally 2 (two) times daily as needed (itching, burning, irritation, or other rectal discomfort caused by hemorrhoids). , Disp: , Rfl:  .  SENSIPAR 30 MG tablet, Take 30  mg by mouth daily., Disp: , Rfl:  .  traMADol (ULTRAM) 50 MG tablet, Take 1 tablet (50 mg total) by mouth every 6 (six) hours as needed., Disp: 20 tablet, Rfl: 0  Current Facility-Administered Medications:  .  triamcinolone acetonide (KENALOG-40) injection 40 mg, 40 mg, Other, Once, Mohammed Kindle, MD .  triamcinolone acetonide (KENALOG-40) injection 40 mg, 40 mg, Other, Once, Mohammed Kindle, MD   Allergies:  Nystatin; Sulfa antibiotics; and Niaspan [niacin]  Review of Systems: Gen:  Denies  fever, sweats, chills HEENT: Denies blurred vision, double vision. bleeds, sore throat Cvc:  No dizziness, chest pain. Resp:   Denies cough or sputum production, shortness of breath Gi: Denies swallowing difficulty, stomach pain. Gu:  Denies bladder incontinence, burning urine Ext:   No Joint pain, stiffness. Skin: No skin rash,  hives  Endoc:  No polyuria, polydipsia. Psych: No depression, insomnia. Other:  All other systems were reviewed with the patient and were negative other that what is mentioned in the HPI.   Physical Examination:   VS: There were no vitals taken for this visit.  General Appearance: No distress    Neuro:without focal findings,  speech normal,  HEENT: PERRLA, EOM intact.   Pulmonary: normal breath sounds, No wheezing.  CardiovascularNormal S1,S2.  No m/r/g.   Abdomen: Benign, Soft, non-tender. Renal:  No costovertebral tenderness  GU:  No performed at this time. Endoc: No evident thyromegaly, no signs of acromegaly. Skin:   warm, no rashes, no ecchymosis  Extremities: normal, no cyanosis, clubbing.  Other findings:    LABORATORY PANEL:   CBC No results for input(s): WBC, HGB, HCT, PLT in the last 168 hours. ------------------------------------------------------------------------------------------------------------------  Chemistries  No results for input(s): NA, K, CL, CO2, GLUCOSE, BUN, CREATININE, CALCIUM, MG, AST, ALT, ALKPHOS, BILITOT in the last 168 hours.  Invalid input(s): GFRCGP ------------------------------------------------------------------------------------------------------------------  Cardiac Enzymes No results for input(s): TROPONINI in the last 168 hours. ------------------------------------------------------------  RADIOLOGY:  No results found.     Thank  you for the consultation and for allowing [facility] to assist in the care of your patient. Our recommendations are noted above.  Please contact us if we can be of further service.   Marda Stalker, MD.  Board Certified in Internal Medicine, Pulmonary Medicine, [place], and Sleep Medicine.  [facility] Office Number: [phone]  Patricia Pesa, M.D.  Merton Border, M.D  03/08/2017

## 2017-03-13 ENCOUNTER — Ambulatory Visit: Payer: Medicare HMO | Admitting: Internal Medicine

## 2017-03-13 ENCOUNTER — Encounter: Payer: Self-pay | Admitting: *Deleted

## 2017-03-13 ENCOUNTER — Other Ambulatory Visit (INDEPENDENT_AMBULATORY_CARE_PROVIDER_SITE_OTHER): Payer: Self-pay | Admitting: Vascular Surgery

## 2017-03-13 MED ORDER — CEFAZOLIN SODIUM-DEXTROSE 1-4 GM/50ML-% IV SOLN: 1.0000 g | Freq: Once | INTRAVENOUS | Status: DC

## 2017-03-14 ENCOUNTER — Ambulatory Visit
Admission: RE | Admit: 2017-03-14 | Discharge: 2017-03-14 | Disposition: A | Discharge status: Home / Self Care | Payer: Medicare HMO | Source: Ambulatory Visit | Attending: Vascular Surgery | Admitting: Vascular Surgery

## 2017-03-14 ENCOUNTER — Encounter: Payer: Self-pay | Admitting: *Deleted

## 2017-03-14 ENCOUNTER — Encounter
Admission: RE | Disposition: A | Discharge status: Home / Self Care | Payer: Self-pay | Source: Ambulatory Visit | Attending: Vascular Surgery

## 2017-03-14 DIAGNOSIS — I2510 Atherosclerotic heart disease of native coronary artery without angina pectoris: Secondary | ICD-10-CM | POA: Insufficient documentation

## 2017-03-14 DIAGNOSIS — I6523 Occlusion and stenosis of bilateral carotid arteries: Secondary | ICD-10-CM | POA: Insufficient documentation

## 2017-03-14 DIAGNOSIS — N289 Disorder of kidney and ureter, unspecified: Secondary | ICD-10-CM | POA: Insufficient documentation

## 2017-03-14 DIAGNOSIS — Z8673 Personal history of transient ischemic attack (TIA), and cerebral infarction without residual deficits: Secondary | ICD-10-CM | POA: Insufficient documentation

## 2017-03-14 DIAGNOSIS — Z882 Allergy status to sulfonamides status: Secondary | ICD-10-CM | POA: Insufficient documentation

## 2017-03-14 DIAGNOSIS — I252 Old myocardial infarction: Secondary | ICD-10-CM | POA: Diagnosis not present

## 2017-03-14 DIAGNOSIS — E1122 Type 2 diabetes mellitus with diabetic chronic kidney disease: Secondary | ICD-10-CM | POA: Insufficient documentation

## 2017-03-14 DIAGNOSIS — T82868A Thrombosis of vascular prosthetic devices, implants and grafts, initial encounter: Secondary | ICD-10-CM | POA: Diagnosis not present

## 2017-03-14 DIAGNOSIS — Z7982 Long term (current) use of aspirin: Secondary | ICD-10-CM | POA: Insufficient documentation

## 2017-03-14 DIAGNOSIS — Z809 Family history of malignant neoplasm, unspecified: Secondary | ICD-10-CM | POA: Insufficient documentation

## 2017-03-14 DIAGNOSIS — N186 End stage renal disease: Secondary | ICD-10-CM | POA: Insufficient documentation

## 2017-03-14 DIAGNOSIS — J449 Chronic obstructive pulmonary disease, unspecified: Secondary | ICD-10-CM | POA: Insufficient documentation

## 2017-03-14 DIAGNOSIS — Z833 Family history of diabetes mellitus: Secondary | ICD-10-CM | POA: Insufficient documentation

## 2017-03-14 DIAGNOSIS — Y832 Surgical operation with anastomosis, bypass or graft as the cause of abnormal reaction of the patient, or of later complication, without mention of misadventure at the time of the procedure: Secondary | ICD-10-CM | POA: Diagnosis not present

## 2017-03-14 DIAGNOSIS — Z992 Dependence on renal dialysis: Secondary | ICD-10-CM | POA: Insufficient documentation

## 2017-03-14 DIAGNOSIS — Z9889 Other specified postprocedural states: Secondary | ICD-10-CM | POA: Diagnosis not present

## 2017-03-14 DIAGNOSIS — Z96653 Presence of artificial knee joint, bilateral: Secondary | ICD-10-CM | POA: Insufficient documentation

## 2017-03-14 DIAGNOSIS — Z6835 Body mass index (BMI) 35.0-35.9, adult: Secondary | ICD-10-CM | POA: Diagnosis not present

## 2017-03-14 DIAGNOSIS — E7800 Pure hypercholesterolemia, unspecified: Secondary | ICD-10-CM | POA: Insufficient documentation

## 2017-03-14 DIAGNOSIS — Z888 Allergy status to other drugs, medicaments and biological substances status: Secondary | ICD-10-CM | POA: Diagnosis not present

## 2017-03-14 DIAGNOSIS — J45909 Unspecified asthma, uncomplicated: Secondary | ICD-10-CM | POA: Insufficient documentation

## 2017-03-14 DIAGNOSIS — Z8249 Family history of ischemic heart disease and other diseases of the circulatory system: Secondary | ICD-10-CM | POA: Insufficient documentation

## 2017-03-14 DIAGNOSIS — I120 Hypertensive chronic kidney disease with stage 5 chronic kidney disease or end stage renal disease: Secondary | ICD-10-CM | POA: Insufficient documentation

## 2017-03-14 DIAGNOSIS — Z951 Presence of aortocoronary bypass graft: Secondary | ICD-10-CM | POA: Diagnosis not present

## 2017-03-14 DIAGNOSIS — T82858A Stenosis of vascular prosthetic devices, implants and grafts, initial encounter: Secondary | ICD-10-CM | POA: Insufficient documentation

## 2017-03-14 DIAGNOSIS — E669 Obesity, unspecified: Secondary | ICD-10-CM | POA: Insufficient documentation

## 2017-03-14 DIAGNOSIS — F17210 Nicotine dependence, cigarettes, uncomplicated: Secondary | ICD-10-CM | POA: Insufficient documentation

## 2017-03-14 HISTORY — PX: A/V FISTULAGRAM: CATH118298

## 2017-03-14 LAB — POTASSIUM (ARMC VASCULAR LAB ONLY): Potassium (ARMC vascular lab): 3.3 — ABNORMAL LOW (ref 3.5–5.1)

## 2017-03-14 SURGERY — A/V FISTULAGRAM
Anesthesia: Moderate Sedation | Site: Arm Upper | Laterality: Left

## 2017-03-14 MED ORDER — HYDROMORPHONE HCL 1 MG/ML IJ SOLN: 1.0000 mg | Freq: Once | INTRAMUSCULAR | Status: DC | PRN

## 2017-03-14 MED ORDER — FENTANYL CITRATE (PF) 100 MCG/2ML IJ SOLN
INTRAMUSCULAR | Status: AC
  Filled 2017-03-14: qty 2

## 2017-03-14 MED ORDER — DEXTROSE 5 % IV SOLN
2000.0000 mg | Freq: Once | INTRAVENOUS | Status: DC
  Administered 2017-03-14: 2000 mg via INTRAVENOUS

## 2017-03-14 MED ORDER — HEPARIN SODIUM (PORCINE) 1000 UNIT/ML IJ SOLN
INTRAMUSCULAR | Status: DC | PRN
  Administered 2017-03-14: 4000 [IU] via INTRAVENOUS

## 2017-03-14 MED ORDER — MIDAZOLAM HCL 5 MG/5ML IJ SOLN
INTRAMUSCULAR | Status: AC
  Filled 2017-03-14: qty 5

## 2017-03-14 MED ORDER — IOPAMIDOL (ISOVUE-300) INJECTION 61%
INTRAVENOUS | Status: DC | PRN
  Administered 2017-03-14: 65 mL via INTRAVENOUS

## 2017-03-14 MED ORDER — FENTANYL CITRATE (PF) 100 MCG/2ML IJ SOLN
INTRAMUSCULAR | Status: DC | PRN
  Administered 2017-03-14 (×3): 50 ug via INTRAVENOUS

## 2017-03-14 MED ORDER — SODIUM CHLORIDE 0.9 % IV SOLN
INTRAVENOUS | Status: DC
  Administered 2017-03-14: 12:00:00 via INTRAVENOUS

## 2017-03-14 MED ORDER — MIDAZOLAM HCL 2 MG/2ML IJ SOLN
INTRAMUSCULAR | Status: DC | PRN
  Administered 2017-03-14: 1 mg via INTRAVENOUS
  Administered 2017-03-14: 2 mg via INTRAVENOUS
  Administered 2017-03-14: 1 mg via INTRAVENOUS

## 2017-03-14 MED ORDER — LIDOCAINE HCL (PF) 1 % IJ SOLN
INTRAMUSCULAR | Status: AC
  Filled 2017-03-14: qty 30

## 2017-03-14 MED ORDER — CEFAZOLIN SODIUM-DEXTROSE 2-4 GM/100ML-% IV SOLN: 2.0000 g | Freq: Once | INTRAVENOUS | Status: DC

## 2017-03-14 MED ORDER — METHYLPREDNISOLONE SODIUM SUCC 125 MG IJ SOLR: 125.0000 mg | INTRAMUSCULAR | Status: DC | PRN

## 2017-03-14 MED ORDER — HEPARIN SODIUM (PORCINE) 1000 UNIT/ML IJ SOLN
INTRAMUSCULAR | Status: AC
  Filled 2017-03-14: qty 1

## 2017-03-14 MED ORDER — ONDANSETRON HCL 4 MG/2ML IJ SOLN: 4.0000 mg | Freq: Four times a day (QID) | INTRAMUSCULAR | Status: DC | PRN

## 2017-03-14 MED ORDER — SODIUM CHLORIDE 0.9 % IR SOLN
Freq: Once | Status: DC
  Filled 2017-03-14: qty 2

## 2017-03-14 MED ORDER — CEFAZOLIN SODIUM-DEXTROSE 2-4 GM/100ML-% IV SOLN
INTRAVENOUS | Status: AC
  Filled 2017-03-14: qty 100

## 2017-03-14 MED ORDER — HEPARIN (PORCINE) IN NACL 2-0.9 UNIT/ML-% IJ SOLN
INTRAMUSCULAR | Status: AC
  Filled 2017-03-14: qty 1000

## 2017-03-14 SURGICAL SUPPLY — 15 items
BALLN DORADO 10X40X80 (BALLOONS) ×3
BALLN LUTONIX AV 12X40X75 (BALLOONS) ×12
BALLOON DORADO 10X40X80 (BALLOONS) ×1 IMPLANT
BALLOON LUTONIX AV 12X40X75 (BALLOONS) ×4 IMPLANT
DEVICE PRESTO INFLATION (MISCELLANEOUS) ×3 IMPLANT
NEEDLE ENTRY 21GA 7CM ECHOTIP (NEEDLE) ×3 IMPLANT
PACK ANGIOGRAPHY (CUSTOM PROCEDURE TRAY) ×3 IMPLANT
SET INTRO CAPELLA COAXIAL (SET/KITS/TRAYS/PACK) ×3 IMPLANT
SHEATH BRITE TIP 6FRX5.5 (SHEATH) ×3 IMPLANT
SHEATH BRITE TIP 7FRX5.5 (SHEATH) ×3 IMPLANT
SHEATH PINNACLE 11FRX10 (SHEATH) ×3 IMPLANT
STENT VIABAHN 11X50X120 (Permanent Stent) ×3 IMPLANT
SUT MNCRL AB 4-0 PS2 18 (SUTURE) ×3 IMPLANT
WIRE G 018X200 V18 (WIRE) ×3 IMPLANT
WIRE MAGIC TOR.035 180C (WIRE) ×3 IMPLANT

## 2017-03-14 NOTE — Discharge Instructions (Signed)

## 2017-03-14 NOTE — Op Note (Signed)
OPERATIVE NOTE   PROCEDURE: 1. Contrast injection left brachiocephalic AV access 2. Percutaneous transluminal angioplasty and placement of an 11 x 50 mm Viabahn stent peripheral postdilated to 12 mm with a Lutonix drug-eluting balloon 3. Percutaneous transluminal angioplasty central venous segment to 12 mm with a Lutonix drug-eluting balloon  PRE-OPERATIVE DIAGNOSIS: Complication of dialysis access                                                       End Stage Renal Disease  POST-OPERATIVE DIAGNOSIS: same as above   SURGEON: Katha Cabal, M.D.  ANESTHESIA: Conscious sedation was administered under my direct supervision by the interventional radiology RN. IV Versed plus fentanyl were utilized. Continuous ECG, pulse oximetry and blood pressure was monitored throughout the entire procedure.  Conscious sedation was for a total of 47.  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 female who  presents with malfunctioning left arm brachiocephalic AV access.  The patient is scheduled for angiography with possible intervention of the AV access.  The patient is aware the risks include but are not limited to: bleeding, infection, thrombosis of the cannulated access, and possible anaphylactic reaction to the contrast.  The patient acknowledges if the access can not be salvaged a tunneled catheter will be needed and will be placed during this procedure.  The patient is aware of the risks of the procedure and elects to proceed with the angiogram and intervention.  DESCRIPTION: After full informed written consent was obtained, the patient was brought back to the Special Procedure suite and placed supine position.  Appropriate cardiopulmonary monitors were placed.  The left  arm was prepped and draped in the standard fashion.  Appropriate timeout is called. The brachiocephalic AV fistula  was cannulated with a micropuncture needle.  Cannulation was performed with ultrasound guidance. Ultrasound was placed in a sterile sleeve, the AV access was interrogated and noted to be echolucent and compressible indicating patency. Image was recorded for the permanent record. The puncture is performed under continuous ultrasound visualization.   The microwire was advanced and the needle was exchanged for  a microsheath.  The J-wire was then advanced and a 6 Fr sheath inserted.  Hand injections were completed to image the access from the arterial anastomosis through the entire access.  The central venous structures were also imaged by hand injections.  Based on the images there are 3 strictures noted one in the innominate vein which is approximately 70% one in the midportion of the AV fistula in the peripheral portion that is greater than 90% and one in the peripheral portion of the AV fistula more proximal that is closer to the arterial anastomosis which is 70-80%. Therefore, 4000 units of heparin was given and a wire was negotiated through the strictures within the venous portion of the graft as well as the central stenosis. The sheath was then upsized to an 11 [place] sheath.  The detector was then positioned over the midportion of the AV fistula this is the peripheral segment. An 10 mm x 60 mm [place] balloon was used.  Inflation was to  36 atm for 1 minute.  Next a 11 mm x 50 mm Viabahn stent was deployed across the lesion. This stent was then postdilated using two 12 mm x 40 mm Lutonix drug-eluting balloons. Both inflations were to 12 atm for 1 full minute each.    The detector was then repositioned over the central portion and a 12 mm x 40 mm Lutonix drug eluding balloon was used to treat the stricture within the innominate vein. Inflation was to 14 atm for 2 minutes.  Follow-up imaging  demonstrated a marked improvement with rapid flow of contrast through the innominate vein.  The detector was then repositioned over the peripheral portion of the AV access and 10 mm x 40 mm [place] balloon was used to treat the second stricture within the AV access. Inflation was to 32 atm for 1 minute.  Subsequent leak, a 12 mm x 40 mm Lutonix drug-eluting balloon was used and plasty this area to 12 atm for approximately 2 minutes.  Follow-up imaging demonstrates significant improvement with less than 20% residual stenosis at all 3 locations.  There is now rapid flow of contrast through the graft and the central veins.   A 4-0 Monocryl purse-string suture was sewn around the sheath.  The sheath was removed and light pressure was applied.  A sterile bandage was applied to the puncture site.    COMPLICATIONS: None  CONDITION: Kimberly Shaffer, M.D [facility] Office: [phone]  03/14/2017 1:48 PM

## 2017-03-14 NOTE — H&P (Signed)
[facility] History & Physical Update  The patient was interviewed and re-examined.  The patient's previous History and Physical has been reviewed and is unchanged.  There is no change in the plan of care. We plan to proceed with the scheduled procedure.  Hortencia Pilar, MD  03/14/2017, 11:09 AM

## 2017-03-14 NOTE — Progress Notes (Signed)
Patient remains clinically stable post fistulogram,vitals stable, permission granted per Dr Delana Meyer to discharge patient to home at this time, discharge teaching done with questions answered. Denies complaints.

## 2017-03-15 IMAGING — CR DG CHEST 2V
2 series · 2 of 2 positions shown · non-contrast
Comparison: October 29, 2015.

CLINICAL DATA: Fever, chills, shortness of breath.

EXAM:
CHEST  2 VIEW

[chest pa]
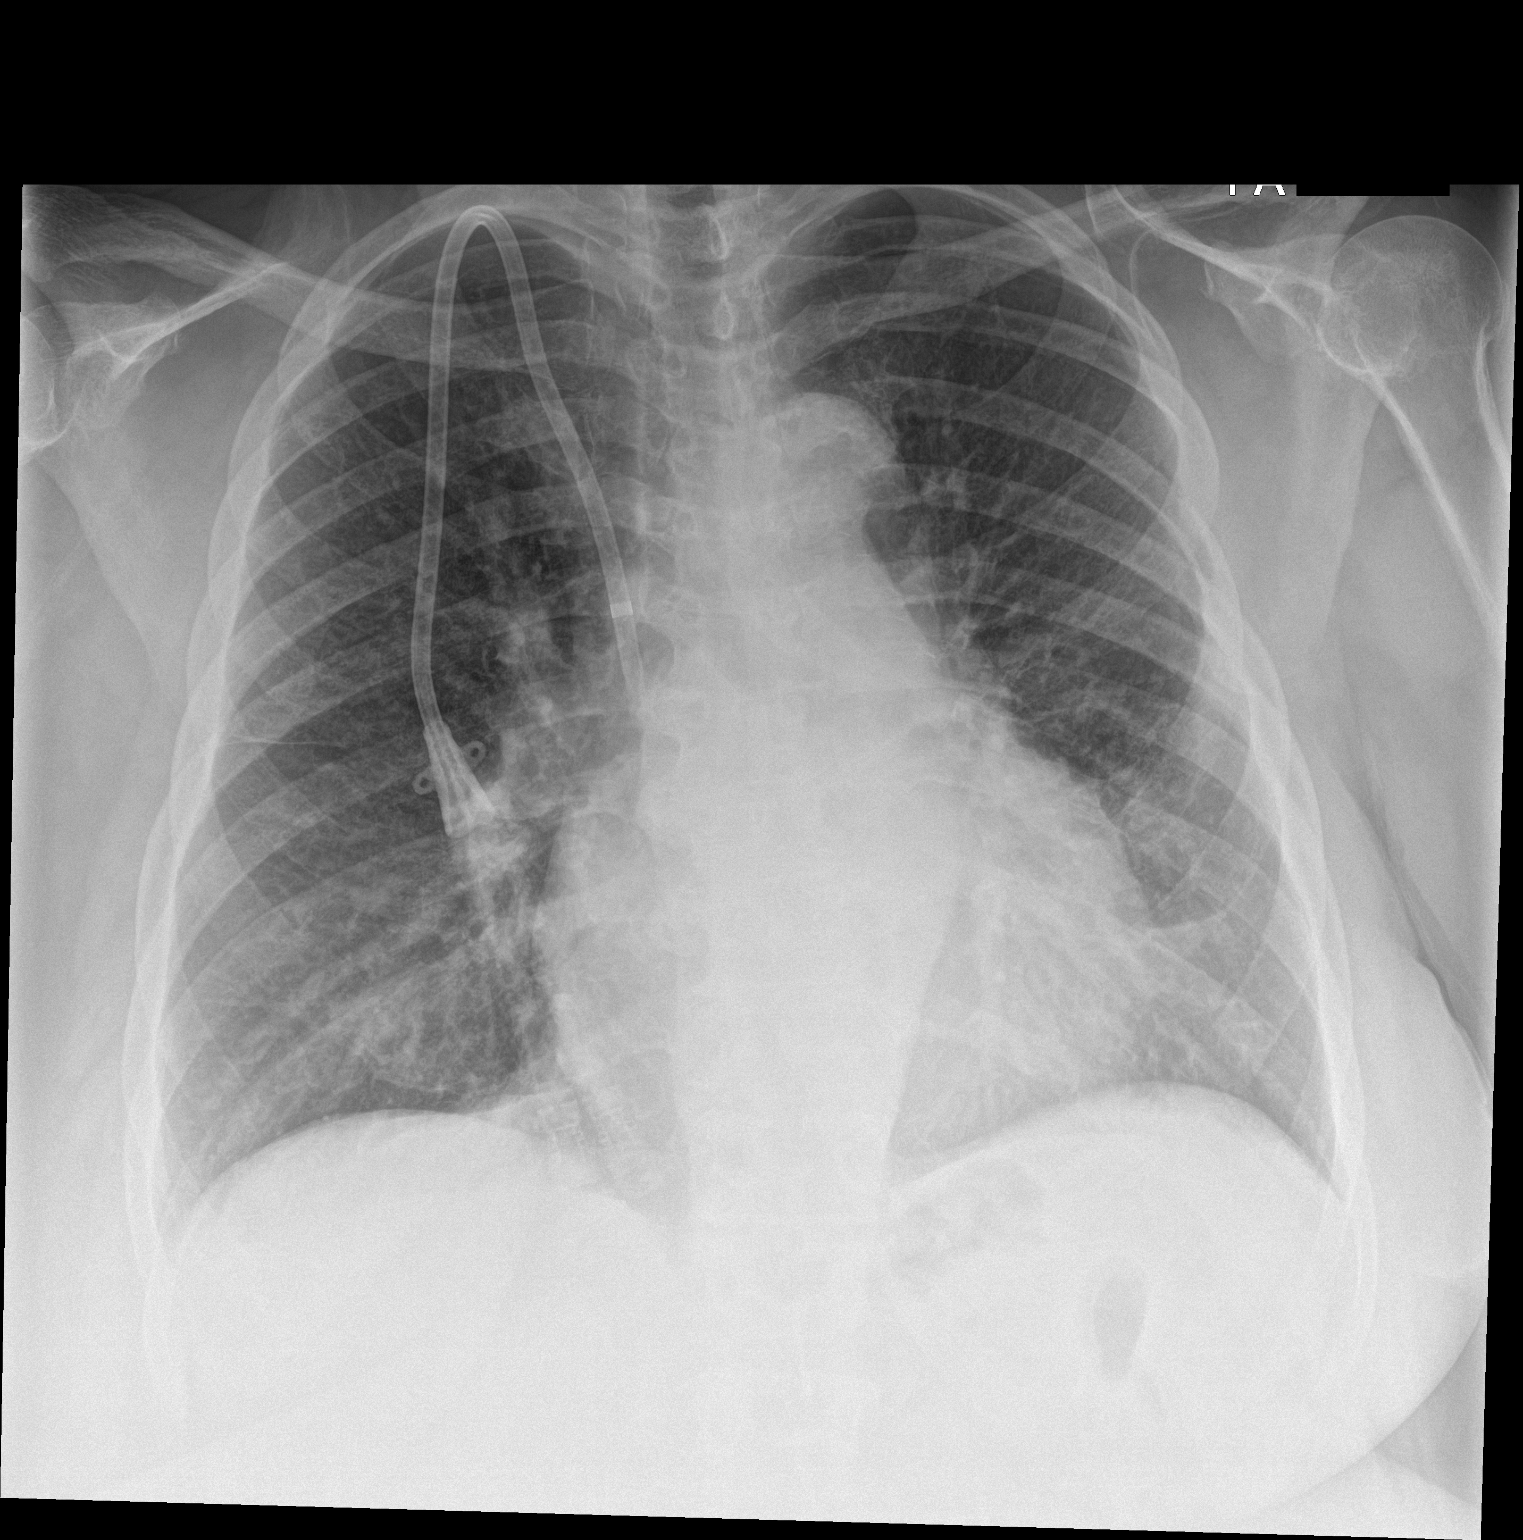

[chest lat]
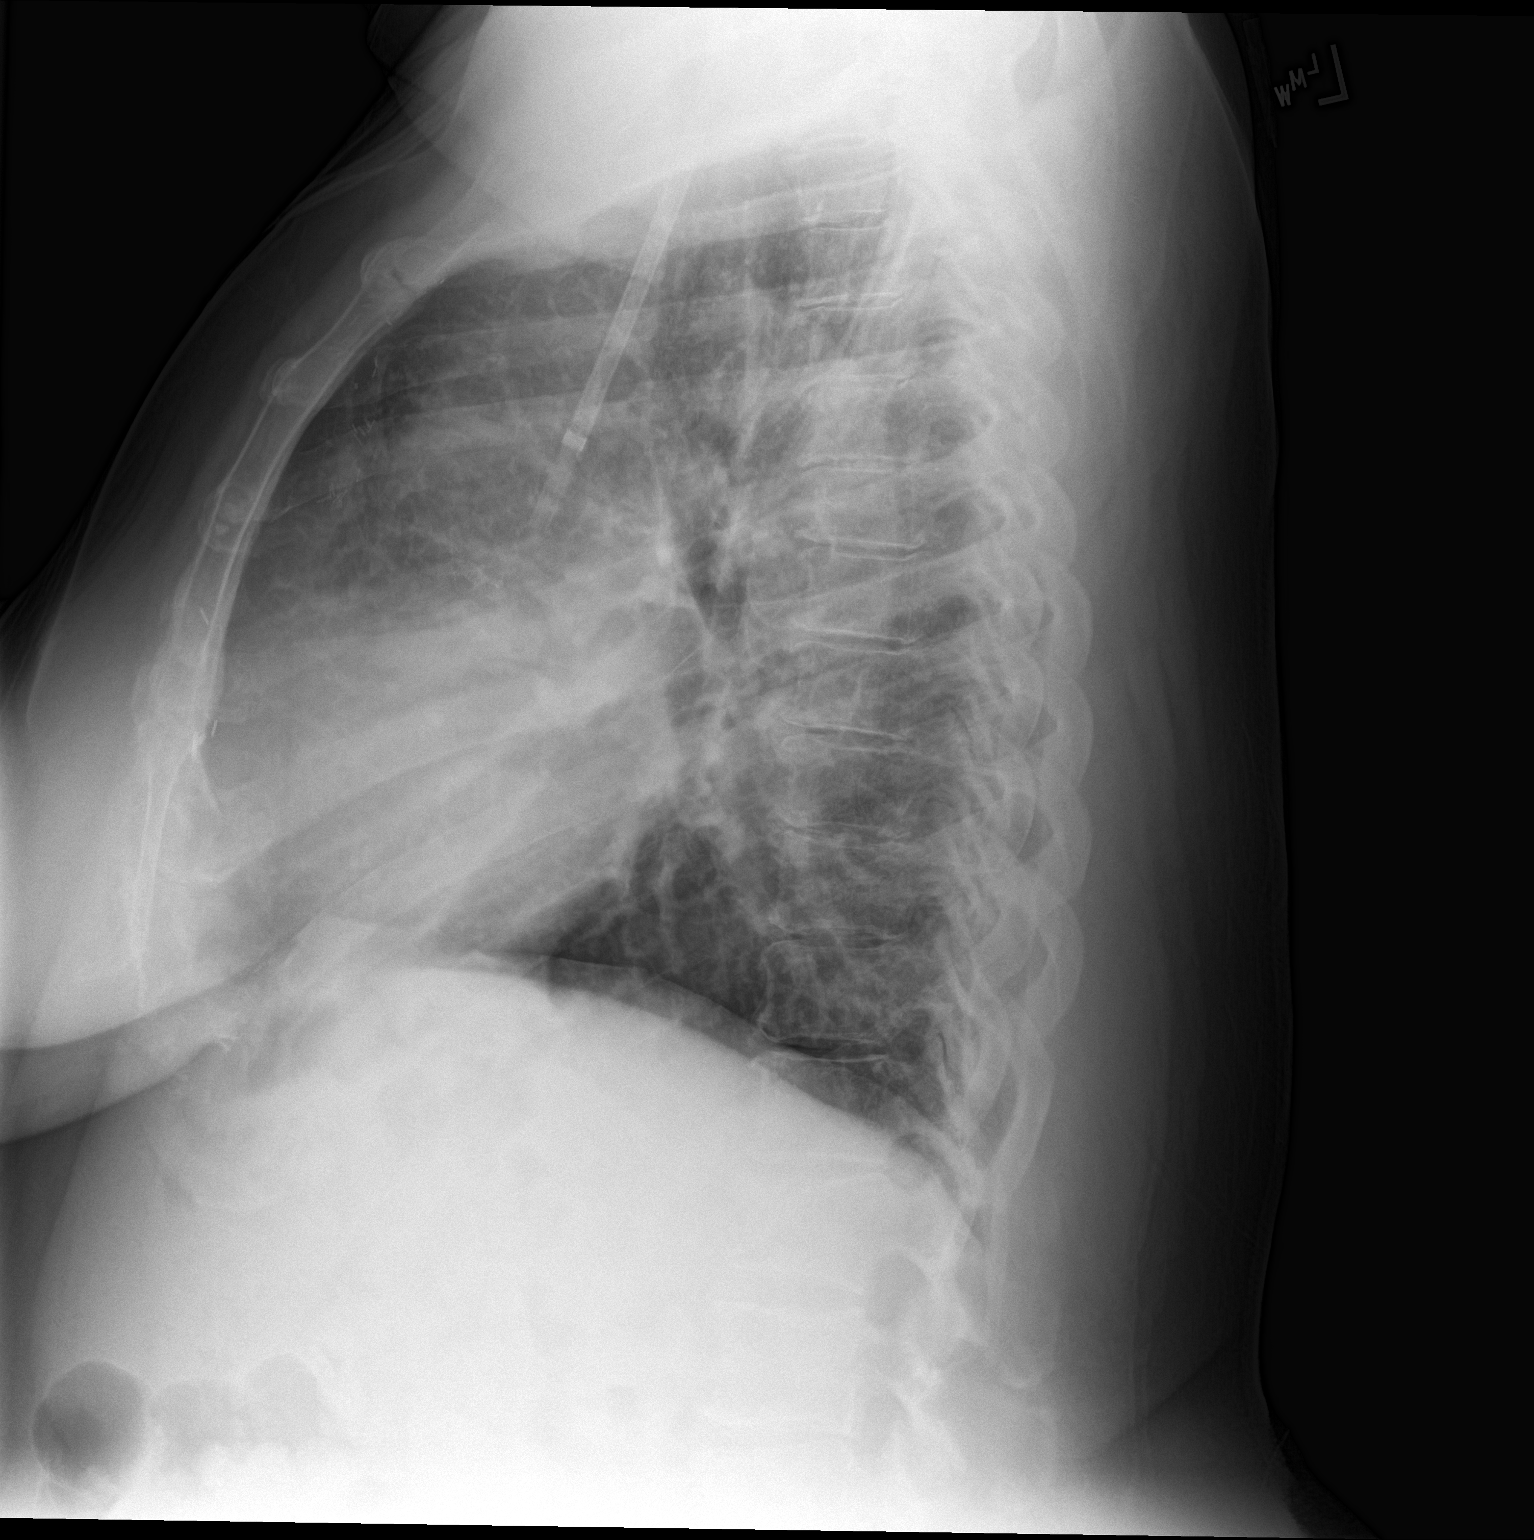

[2 of 2 positions shown; findings below may reference images not displayed]

FINDINGS: Stable cardiomegaly. No pneumothorax or pleural effusion is noted.
Right internal jugular dialysis catheter is noted with distal tip in
expected position of the SVC. Minimally increased interstitial
densities are noted in the perihilar and basilar regions suggesting
possible minimal pulmonary edema. Bony thorax is unremarkable.
IMPRESSION: Minimally increased interstitial densities are noted in the
bilateral perihilar and basilar regions suggesting possible minimal
pulmonary edema.

## 2017-03-20 IMAGING — CR DG CHEST 2V
2 series · 2 of 2 positions shown · non-contrast
Comparison: 12/14/2015

CLINICAL DATA: Bronchitis, weakness, shortness of breath, COPD,
hypertension, diabetes, coronary artery disease post MI/PTCA/CABG

EXAM:
CHEST  2 VIEW

[chest lat]
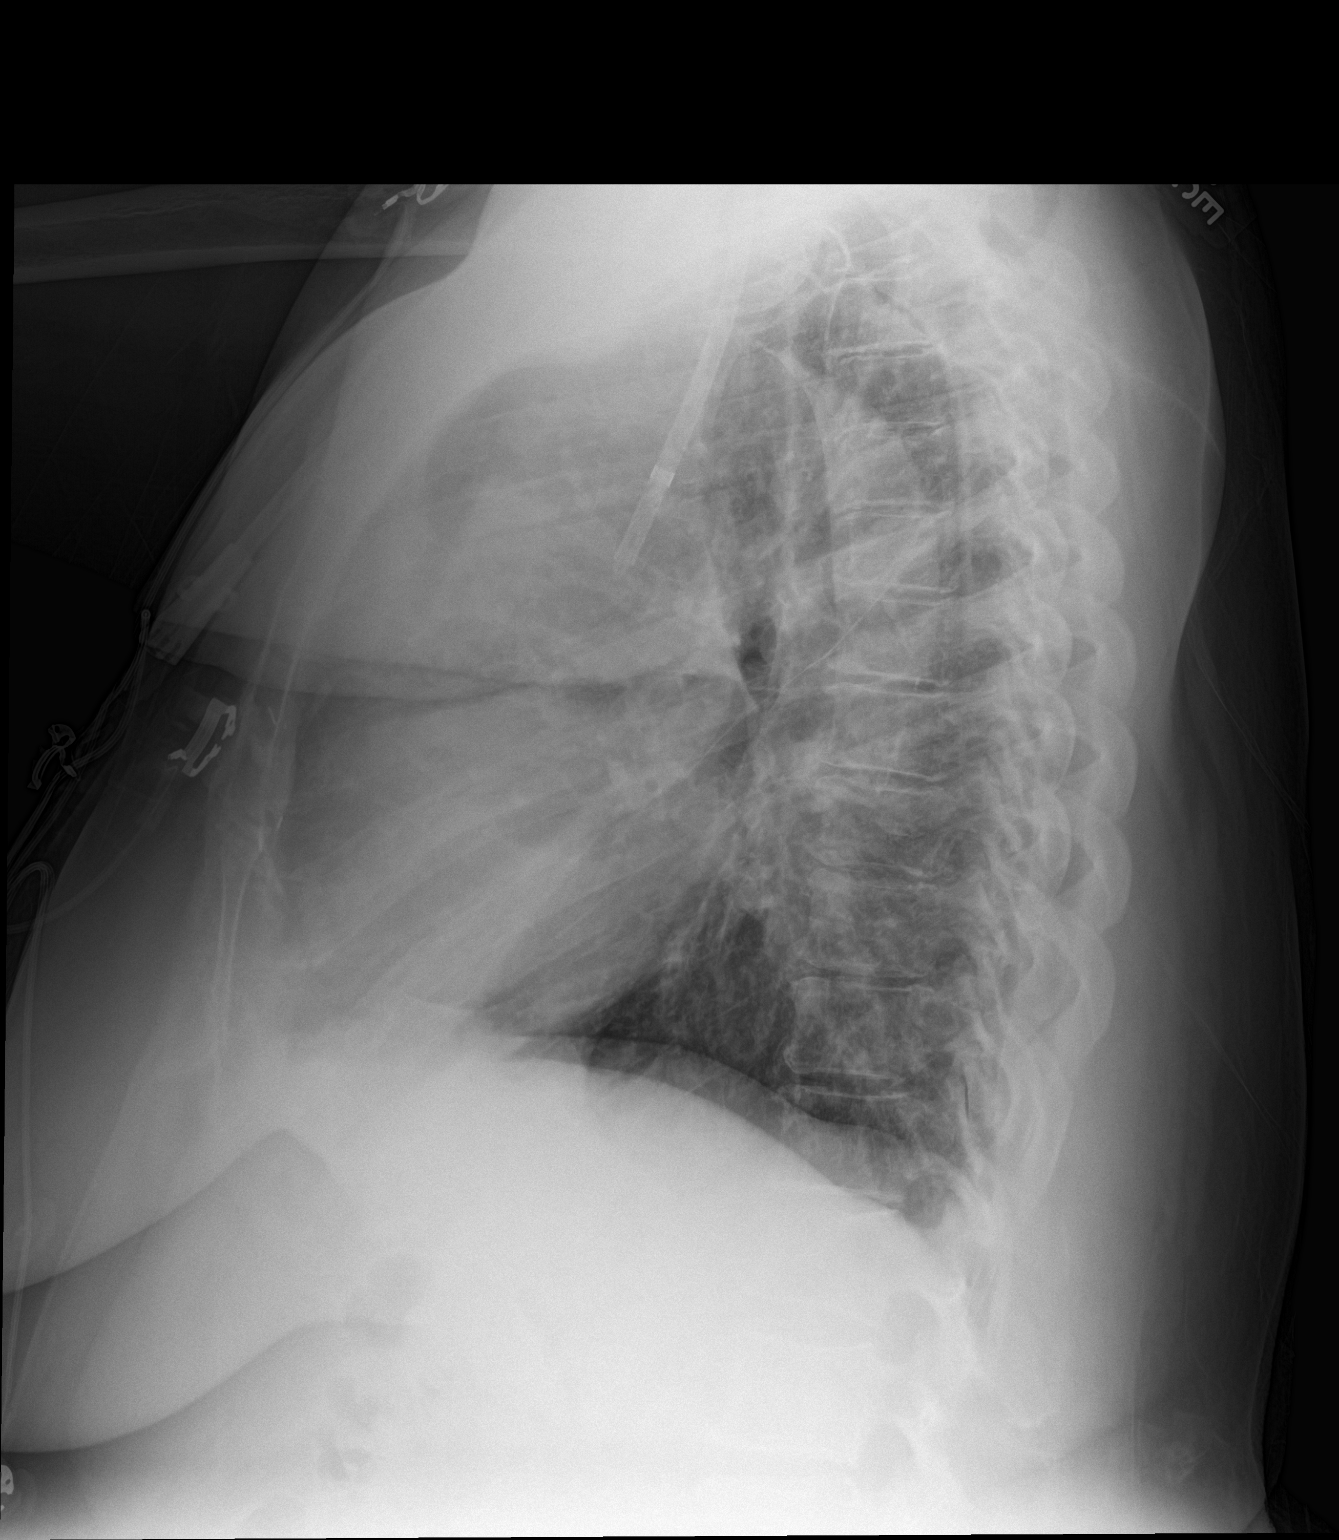

[chest pa]
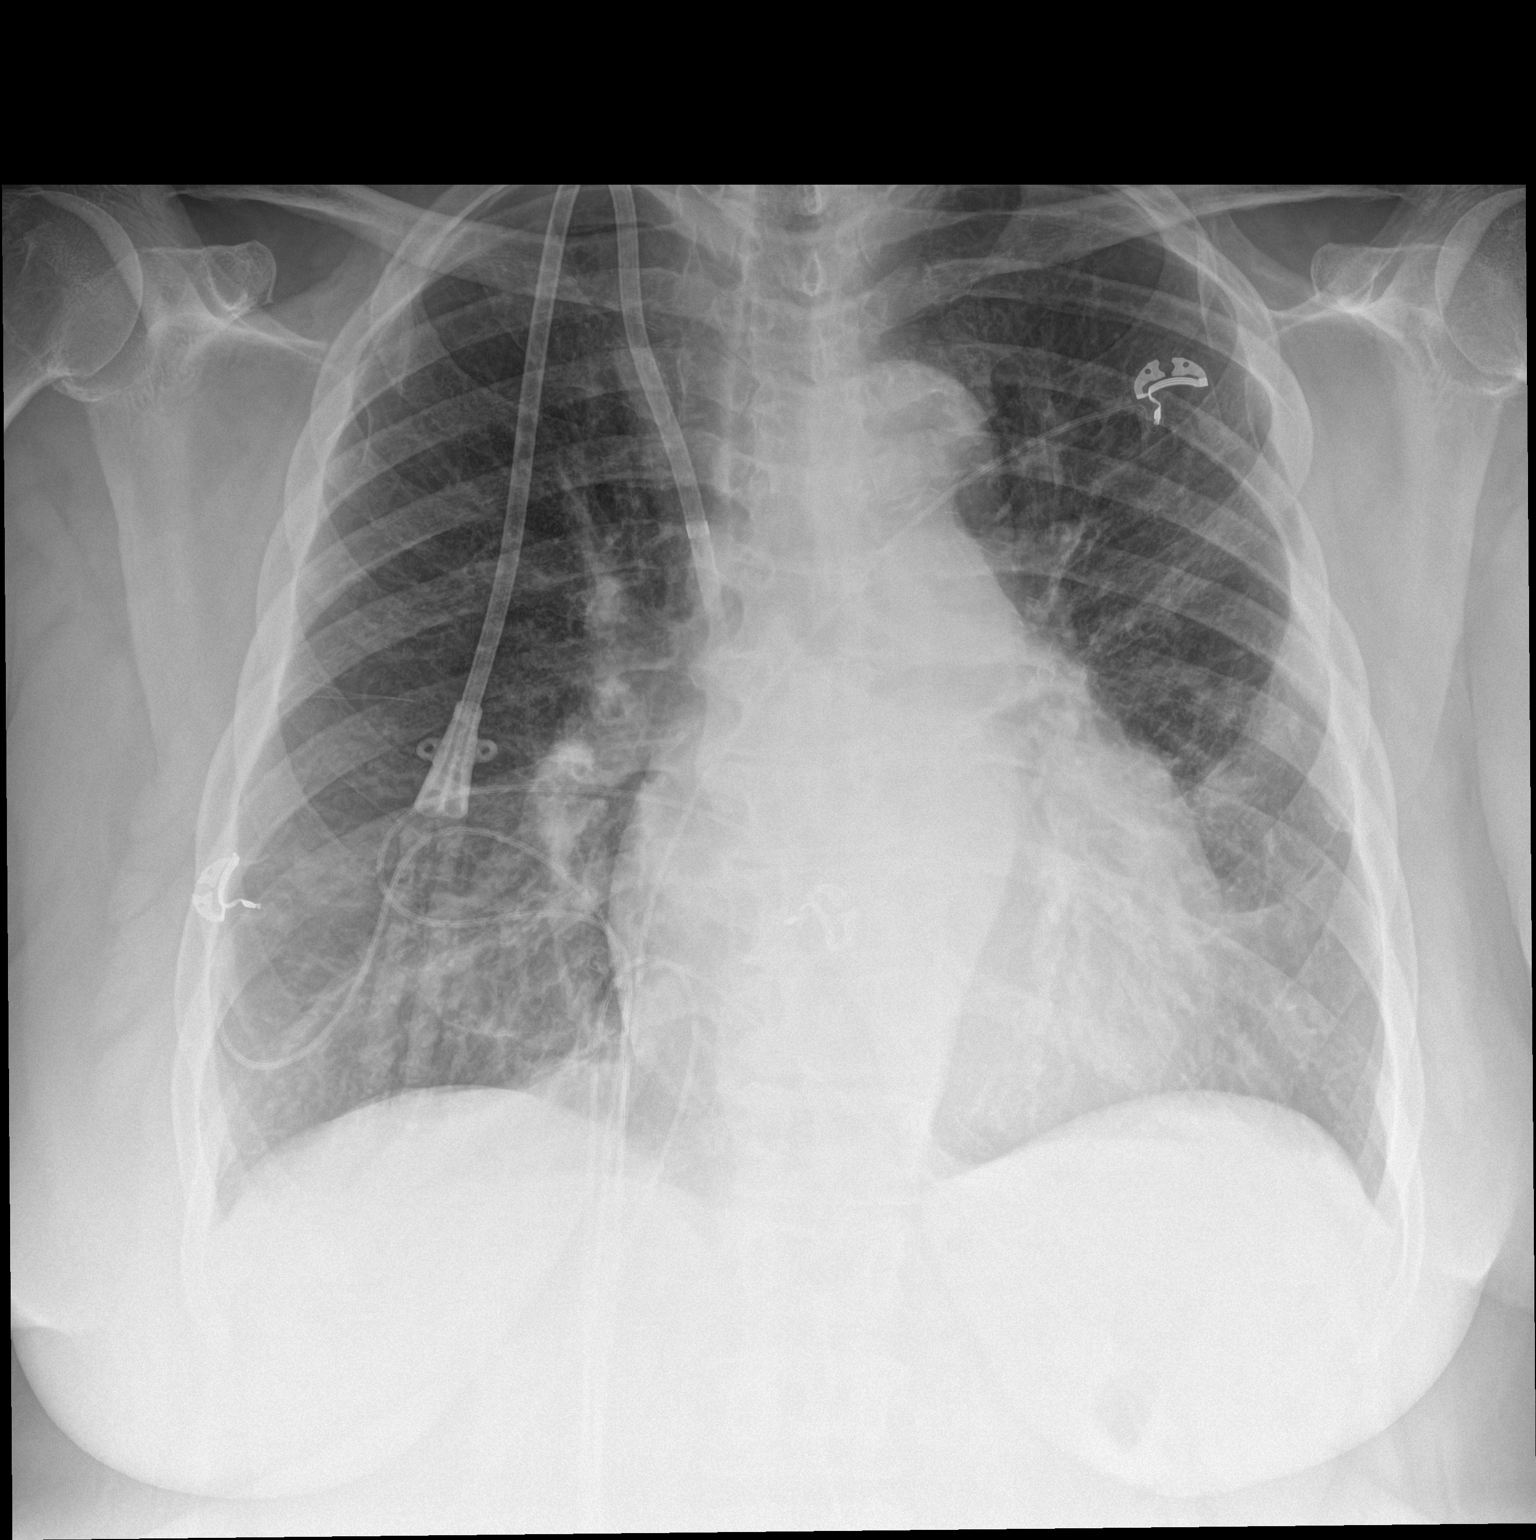

[2 of 2 positions shown; findings below may reference images not displayed]

FINDINGS: RIGHT jugular central venous catheter with tip projecting over SVC.

Enlargement of cardiac silhouette.

Tortuosity of thoracic aorta with atherosclerotic calcification.

Bronchitic changes with subsegmental atelectasis LEFT upper lobe.

No acute infiltrate, pleural effusion or pneumothorax.

Bones appear demineralized.
IMPRESSION: Enlargement of cardiac silhouette.

Bronchitic changes with LEFT upper lobe subsegmental atelectasis.

## 2017-04-28 ENCOUNTER — Other Ambulatory Visit (INDEPENDENT_AMBULATORY_CARE_PROVIDER_SITE_OTHER): Payer: Self-pay | Admitting: Vascular Surgery

## 2017-04-28 DIAGNOSIS — N186 End stage renal disease: Secondary | ICD-10-CM

## 2017-04-28 DIAGNOSIS — T829XXD Unspecified complication of cardiac and vascular prosthetic device, implant and graft, subsequent encounter: Secondary | ICD-10-CM

## 2017-05-01 ENCOUNTER — Ambulatory Visit (INDEPENDENT_AMBULATORY_CARE_PROVIDER_SITE_OTHER): Payer: Medicare HMO

## 2017-05-01 ENCOUNTER — Encounter (INDEPENDENT_AMBULATORY_CARE_PROVIDER_SITE_OTHER): Payer: Self-pay

## 2017-05-01 ENCOUNTER — Encounter (INDEPENDENT_AMBULATORY_CARE_PROVIDER_SITE_OTHER): Payer: Medicare HMO | Admitting: Vascular Surgery

## 2017-05-01 DIAGNOSIS — T829XXD Unspecified complication of cardiac and vascular prosthetic device, implant and graft, subsequent encounter: Secondary | ICD-10-CM

## 2017-05-01 DIAGNOSIS — N186 End stage renal disease: Secondary | ICD-10-CM | POA: Diagnosis not present

## 2017-05-08 ENCOUNTER — Encounter (INDEPENDENT_AMBULATORY_CARE_PROVIDER_SITE_OTHER): Payer: Medicare HMO

## 2017-05-08 ENCOUNTER — Encounter (INDEPENDENT_AMBULATORY_CARE_PROVIDER_SITE_OTHER): Payer: Self-pay

## 2017-05-08 ENCOUNTER — Ambulatory Visit (INDEPENDENT_AMBULATORY_CARE_PROVIDER_SITE_OTHER): Payer: Medicare HMO | Admitting: Vascular Surgery

## 2017-05-28 DIAGNOSIS — K659 Peritonitis, unspecified: Secondary | ICD-10-CM | POA: Insufficient documentation

## 2017-06-09 ENCOUNTER — Other Ambulatory Visit: Payer: Self-pay | Admitting: Nephrology

## 2017-06-09 ENCOUNTER — Ambulatory Visit
Admission: RE | Admit: 2017-06-09 | Discharge: 2017-06-09 | Disposition: A | Discharge status: Home / Self Care | Payer: Medicare HMO | Source: Ambulatory Visit | Attending: Nephrology | Admitting: Nephrology

## 2017-06-09 DIAGNOSIS — Z4901 Encounter for fitting and adjustment of extracorporeal dialysis catheter: Secondary | ICD-10-CM | POA: Insufficient documentation

## 2017-06-26 DIAGNOSIS — J449 Chronic obstructive pulmonary disease, unspecified: Secondary | ICD-10-CM | POA: Insufficient documentation

## 2017-06-27 ENCOUNTER — Encounter: Payer: Self-pay | Admitting: *Deleted

## 2017-06-27 ENCOUNTER — Inpatient Hospital Stay
Admission: EM | Admit: 2017-06-27 | Discharge: 2017-07-01 | DRG: 356 | Disposition: A | Discharge status: Home / Self Care | Payer: Medicare HMO | Attending: Specialist | Admitting: Specialist

## 2017-06-27 DIAGNOSIS — E669 Obesity, unspecified: Secondary | ICD-10-CM | POA: Diagnosis present

## 2017-06-27 DIAGNOSIS — N186 End stage renal disease: Secondary | ICD-10-CM | POA: Diagnosis present

## 2017-06-27 DIAGNOSIS — F17210 Nicotine dependence, cigarettes, uncomplicated: Secondary | ICD-10-CM | POA: Diagnosis present

## 2017-06-27 DIAGNOSIS — K652 Spontaneous bacterial peritonitis: Secondary | ICD-10-CM

## 2017-06-27 DIAGNOSIS — M47817 Spondylosis without myelopathy or radiculopathy, lumbosacral region: Secondary | ICD-10-CM

## 2017-06-27 DIAGNOSIS — Z8673 Personal history of transient ischemic attack (TIA), and cerebral infarction without residual deficits: Secondary | ICD-10-CM

## 2017-06-27 DIAGNOSIS — M19011 Primary osteoarthritis, right shoulder: Secondary | ICD-10-CM

## 2017-06-27 DIAGNOSIS — Z882 Allergy status to sulfonamides status: Secondary | ICD-10-CM

## 2017-06-27 DIAGNOSIS — M461 Sacroiliitis, not elsewhere classified: Secondary | ICD-10-CM

## 2017-06-27 DIAGNOSIS — M533 Sacrococcygeal disorders, not elsewhere classified: Secondary | ICD-10-CM

## 2017-06-27 DIAGNOSIS — M5416 Radiculopathy, lumbar region: Secondary | ICD-10-CM

## 2017-06-27 DIAGNOSIS — Z9889 Other specified postprocedural states: Secondary | ICD-10-CM

## 2017-06-27 DIAGNOSIS — Z833 Family history of diabetes mellitus: Secondary | ICD-10-CM

## 2017-06-27 DIAGNOSIS — F411 Generalized anxiety disorder: Secondary | ICD-10-CM | POA: Insufficient documentation

## 2017-06-27 DIAGNOSIS — R109 Unspecified abdominal pain: Secondary | ICD-10-CM

## 2017-06-27 DIAGNOSIS — G629 Polyneuropathy, unspecified: Secondary | ICD-10-CM | POA: Diagnosis present

## 2017-06-27 DIAGNOSIS — N2581 Secondary hyperparathyroidism of renal origin: Secondary | ICD-10-CM | POA: Diagnosis present

## 2017-06-27 DIAGNOSIS — M150 Primary generalized (osteo)arthritis: Secondary | ICD-10-CM

## 2017-06-27 DIAGNOSIS — Z79899 Other long term (current) drug therapy: Secondary | ICD-10-CM

## 2017-06-27 DIAGNOSIS — M5137 Other intervertebral disc degeneration, lumbosacral region: Secondary | ICD-10-CM

## 2017-06-27 DIAGNOSIS — D631 Anemia in chronic kidney disease: Secondary | ICD-10-CM | POA: Diagnosis present

## 2017-06-27 DIAGNOSIS — F329 Major depressive disorder, single episode, unspecified: Secondary | ICD-10-CM | POA: Diagnosis present

## 2017-06-27 DIAGNOSIS — I2510 Atherosclerotic heart disease of native coronary artery without angina pectoris: Secondary | ICD-10-CM | POA: Diagnosis present

## 2017-06-27 DIAGNOSIS — E785 Hyperlipidemia, unspecified: Secondary | ICD-10-CM | POA: Diagnosis present

## 2017-06-27 DIAGNOSIS — Z8249 Family history of ischemic heart disease and other diseases of the circulatory system: Secondary | ICD-10-CM

## 2017-06-27 DIAGNOSIS — K659 Peritonitis, unspecified: Secondary | ICD-10-CM | POA: Diagnosis not present

## 2017-06-27 DIAGNOSIS — M172 Bilateral post-traumatic osteoarthritis of knee: Secondary | ICD-10-CM

## 2017-06-27 DIAGNOSIS — Z955 Presence of coronary angioplasty implant and graft: Secondary | ICD-10-CM

## 2017-06-27 DIAGNOSIS — Z809 Family history of malignant neoplasm, unspecified: Secondary | ICD-10-CM

## 2017-06-27 DIAGNOSIS — Z888 Allergy status to other drugs, medicaments and biological substances status: Secondary | ICD-10-CM

## 2017-06-27 DIAGNOSIS — I120 Hypertensive chronic kidney disease with stage 5 chronic kidney disease or end stage renal disease: Secondary | ICD-10-CM | POA: Diagnosis present

## 2017-06-27 DIAGNOSIS — M159 Polyosteoarthritis, unspecified: Secondary | ICD-10-CM

## 2017-06-27 DIAGNOSIS — M170 Bilateral primary osteoarthritis of knee: Secondary | ICD-10-CM

## 2017-06-27 DIAGNOSIS — Z951 Presence of aortocoronary bypass graft: Secondary | ICD-10-CM

## 2017-06-27 DIAGNOSIS — T829XXA Unspecified complication of cardiac and vascular prosthetic device, implant and graft, initial encounter: Secondary | ICD-10-CM | POA: Diagnosis present

## 2017-06-27 DIAGNOSIS — M19012 Primary osteoarthritis, left shoulder: Secondary | ICD-10-CM

## 2017-06-27 DIAGNOSIS — I252 Old myocardial infarction: Secondary | ICD-10-CM

## 2017-06-27 DIAGNOSIS — G8929 Other chronic pain: Secondary | ICD-10-CM | POA: Diagnosis present

## 2017-06-27 DIAGNOSIS — Z96653 Presence of artificial knee joint, bilateral: Secondary | ICD-10-CM

## 2017-06-27 DIAGNOSIS — M51379 Other intervertebral disc degeneration, lumbosacral region without mention of lumbar back pain or lower extremity pain: Secondary | ICD-10-CM

## 2017-06-27 DIAGNOSIS — Z992 Dependence on renal dialysis: Secondary | ICD-10-CM

## 2017-06-27 DIAGNOSIS — K219 Gastro-esophageal reflux disease without esophagitis: Secondary | ICD-10-CM | POA: Diagnosis present

## 2017-06-27 DIAGNOSIS — J449 Chronic obstructive pulmonary disease, unspecified: Secondary | ICD-10-CM | POA: Diagnosis present

## 2017-06-27 DIAGNOSIS — M7060 Trochanteric bursitis, unspecified hip: Secondary | ICD-10-CM

## 2017-06-27 DIAGNOSIS — E1122 Type 2 diabetes mellitus with diabetic chronic kidney disease: Secondary | ICD-10-CM | POA: Diagnosis present

## 2017-06-27 DIAGNOSIS — M545 Low back pain: Secondary | ICD-10-CM | POA: Diagnosis present

## 2017-06-27 DIAGNOSIS — M160 Bilateral primary osteoarthritis of hip: Secondary | ICD-10-CM

## 2017-06-27 LAB — CBC
HEMATOCRIT: 33.2 % — AB (ref 35.0–47.0)
Hemoglobin: 10.8 g/dL — ABNORMAL LOW (ref 12.0–16.0)
MCH: 29.8 pg (ref 26.0–34.0)
MCHC: 32.6 g/dL (ref 32.0–36.0)
MCV: 91.6 fL (ref 80.0–100.0)
Platelets: 186 10*3/uL (ref 150–440)
RBC: 3.62 MIL/uL — AB (ref 3.80–5.20)
RDW: 15.9 % — ABNORMAL HIGH (ref 11.5–14.5)
WBC: 5.1 10*3/uL (ref 3.6–11.0)

## 2017-06-27 LAB — COMPREHENSIVE METABOLIC PANEL
ALT: 20 U/L (ref 14–54)
AST: 26 U/L (ref 15–41)
Albumin: 3.5 g/dL (ref 3.5–5.0)
Alkaline Phosphatase: 70 U/L (ref 38–126)
Anion gap: 16 — ABNORMAL HIGH (ref 5–15)
BILIRUBIN TOTAL: 0.6 mg/dL (ref 0.3–1.2)
BUN: 88 mg/dL — AB (ref 6–20)
CALCIUM: 8 mg/dL — AB (ref 8.9–10.3)
CO2: 20 mmol/L — ABNORMAL LOW (ref 22–32)
CREATININE: 9.99 mg/dL — AB (ref 0.44–1.00)
Chloride: 103 mmol/L (ref 101–111)
GFR calc non Af Amer: 4 mL/min — ABNORMAL LOW (ref >60)
GFR, EST AFRICAN AMERICAN: 4 mL/min — AB (ref >60)
Glucose, Bld: 133 mg/dL — ABNORMAL HIGH (ref 65–99)
Potassium: 4.5 mmol/L (ref 3.5–5.1)
Sodium: 139 mmol/L (ref 135–145)
TOTAL PROTEIN: 8.1 g/dL (ref 6.5–8.1)

## 2017-06-27 NOTE — ED Triage Notes (Signed)
Pt complains of pain in right abdomen where perineal cathter site is, no redness or infection noted on abdomen, pt continues to use left arm fistula for dialysis , pt did not go to dialysis today

## 2017-06-27 NOTE — ED Notes (Signed)
No protocols in triage per Dr Quentin Cornwall

## 2017-06-27 NOTE — ED Provider Notes (Signed)
[facility] Emergency Department Provider Note  Time seen: 5:44 PM  I have reviewed the triage vital signs and the nursing notes.   HISTORY  Chief Complaint pain at catheter site    HPI Kimberly Shaffer is a 66 y.o. female With a past medical history of asthma, CAD, COPD, diabetes, hypertension, hyperlipidemia, end-stage renal disease on hemodialysis, who presents to the emergency department with abdominal pain around her peritoneal dialysis catheter. According to the patient several months ago she was having similar pains and was ultimately admitted to [facility] for a bacterial infection within her abdomen. Patient states approximately 3 days ago she once again began with abdominal pain at the insertion site of her peritoneal dialysis catheter. Denies any fever, nausea, vomiting or diarrhea. Patient does urinate each day and denies any dysuria. Patient states she missed dialysis today, but did not miss any sessions last week. Her last dialysis was performed on Saturday. Describes her pain as moderate located within the inside of her abdomen per patient. Dull aching pain.  Past Medical History:  Diagnosis Date  . Asthma   . CAD (coronary artery disease)   . Chronic lower back pain   . COPD (chronic obstructive pulmonary disease) ([place])   . Depression   . Diabetes mellitus without complication (HCC)    NIDD  . H/O angina pectoris   . H/O blood clots   . Hypercholesteremia   . Hypertension   . Left rotator cuff tear   . Myocardial infarction ([place])   . Obesity   . Renal insufficiency   . Stroke ([place])   . Vertigo, aural     Patient Active Problem List   Diagnosis Date Noted  . Bilateral carotid artery stenosis 03/08/2017  . ESRD needing dialysis ([place]) 03/08/2017  . Non-ST elevation myocardial infarction (NSTEMI), subendocardial infarction, subsequent episode of care ([place]) 11/04/2016  . Onychogryphosis 10/31/2016  . Onychomycosis 10/31/2016  . Subungual  exostosis 10/31/2016  . Type 2 diabetes mellitus with diabetic neuropathy ([place]) 10/31/2016  . Chronic osteomyelitis of left hand ([place]) 09/03/2016  . Left arm swelling 08/30/2016  . Pre-operative clearance 08/30/2016  . Coronary artery disease of native artery of native heart with stable angina pectoris ([place]) 08/03/2016  . Benign paroxysmal positional vertigo due to bilateral vestibular disorder 07/26/2016  . H/O: CVA (cerebrovascular accident) 07/26/2016  . Stable angina ([place]) 07/05/2016  . Sepsis due to pneumonia ([place]) 07/05/2016  . Fracture of left foot 04/26/2016  . Pre-transplant evaluation for kidney transplant 02/04/2016  . Chest pain at rest 12/17/2015  . Chest pain 12/17/2015  . DJD of shoulder 10/21/2015  . Perianal lesion 05/20/2015  . 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 Lumbar radiculopathy 05/14/2015  . 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 Benign essential hypertension 04/22/2015  . Grief 03/18/2015  . Lumbosacral facet joint syndrome 02/16/2015  . Greater trochanteric bursitis 02/16/2015  . DDD (degenerative disc disease), lumbosacral 01/20/2015  . 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 Osteoarthritis 01/20/2015  . IDA (iron deficiency anemia) 03/25/2014  . Thyroid nodule 01/15/2014  . Lung nodule 08/28/2013  . Tobacco abuse 05/17/2013  . Mixed hyperlipidemia 06/14/2011  . OSA on CPAP 06/14/2011  . PAD (peripheral artery disease) ([place]) 06/14/2011  . Depression 05/05/2011  . Chronic kidney disease (CKD), stage V ([place]) 03/26/2011  . Multiple vessel coronary artery disease 03/26/2011  . Obesity, unspecified 03/26/2011  . Type 2 diabetes mellitus ([place]) 03/26/2011  Past Surgical History:  Procedure Laterality Date  . A/V FISTULAGRAM Left 03/14/2017   Procedure: A/V Fistulagram;  Surgeon: Katha Cabal, MD;  Location: [place] [facility];  Service: Cardiovascular;  Laterality: Left;  . AV FISTULA PLACEMENT  Left 11-04-2015  . AV FISTULA PLACEMENT Left 11/04/2015   Procedure: ARTERIOVENOUS (AV) FISTULA CREATION  ( BRACHIAL CEPHALIC );  Surgeon: Katha Cabal, MD;  Location: [facility];  Service: Vascular;  Laterality: Left;  . CARDIAC CATHETERIZATION    . CARDIAC CATHETERIZATION Left 07/19/2016   Procedure: Left Heart Cath and Coronary Angiography;  Surgeon: Corey Skains, MD;  Location: [place] [facility];  Service: Cardiovascular;  Laterality: Left;  . CORONARY ARTERY BYPASS GRAFT  2012  . CORONARY STENT PLACEMENT    . JOINT REPLACEMENT    . KNEE SURGERY Bilateral   . PERIPHERAL VASCULAR CATHETERIZATION N/A 11/13/2015   Procedure: Dialysis/Perma Catheter Insertion;  Surgeon: Katha Cabal, MD;  Location: [place] [facility];  Service: Cardiovascular;  Laterality: N/A;  . PERIPHERAL VASCULAR CATHETERIZATION N/A 12/04/2015   Procedure: Dialysis/Perma Catheter Insertion;  Surgeon: Katha Cabal, MD;  Location: [place] [facility];  Service: Cardiovascular;  Laterality: N/A;  . PERIPHERAL VASCULAR CATHETERIZATION Left 12/31/2015   Procedure: A/V Shuntogram/Fistulagram;  Surgeon: Algernon Huxley, MD;  Location: [place] [facility];  Service: Cardiovascular;  Laterality: Left;  . PERIPHERAL VASCULAR CATHETERIZATION N/A 12/31/2015   Procedure: A/V Shunt Intervention;  Surgeon: Algernon Huxley, MD;  Location: [place] [facility];  Service: Cardiovascular;  Laterality: N/A;  . PERIPHERAL VASCULAR CATHETERIZATION Left 02/25/2016   Procedure: A/V Shuntogram/Fistulagram;  Surgeon: Algernon Huxley, MD;  Location: [place] [facility];  Service: Cardiovascular;  Laterality: Left;  . PERIPHERAL VASCULAR CATHETERIZATION N/A 03/18/2016   Procedure: Dialysis/Perma Catheter Removal;  Surgeon: Katha Cabal, MD;  Location: [place] [facility];  Service: Cardiovascular;  Laterality: N/A;  . REPLACEMENT TOTAL KNEE BILATERAL Bilateral     Prior to Admission medications   Medication Sig Start Date End Date  Taking? Authorizing Provider  albuterol (PROVENTIL HFA;VENTOLIN HFA) 108 (90 BASE) MCG/ACT inhaler Inhale into the lungs every 4 (four) hours as needed for wheezing or shortness of breath. 2-4 puffs    [provider]  amLODipine (NORVASC) 10 MG tablet Take 10 mg by mouth daily.    [provider]  aspirin EC 81 MG tablet Take 81 mg by mouth daily.    [provider]  calcium carbonate (OSCAL) 1500 (600 Ca) MG TABS tablet Take 600 mg of elemental calcium by mouth daily with breakfast.    [provider]  diclofenac (FLECTOR) 1.3 % PTCH Apply one patch for a portion of patch to painful area of skin for strain, sprain, and spasms of the upper back and shoulder twice per day if tolerated Patient taking differently: Place 1 patch onto the skin 2 (two) times daily as needed (for pain.).  04/13/16   Mohammed Kindle, MD  Fluticasone-Salmeterol (ADVAIR DISKUS) 250-50 MCG/DOSE AEPB Inhale 1 puff into the lungs 2 (two) times daily. Patient not taking: Reported on 03/14/2017 01/19/16 03/10/18  Laverle Hobby, MD  folic acid (FOLVITE) 1 MG tablet Take 1 mg by mouth daily.    [provider]  GARLIC PO Take 1 capsule by mouth daily.    [provider]  ipratropium (ATROVENT) 0.02 % nebulizer solution Take 2.5 mLs (0.5 mg total) by nebulization every 6 (six) hours as needed for wheezing or shortness  of breath. 01/19/16   Laverle Hobby, MD  levocetirizine (XYZAL) 5 MG tablet Take 5 mg by mouth daily.    [provider]  lubiprostone (AMITIZA) 24 MCG capsule Take 24 mcg by mouth daily as needed for constipation.    [provider]  meclizine (ANTIVERT) 12.5 MG tablet Take 1 tablet (12.5 mg total) by mouth 3 (three) times daily as needed for dizziness or nausea. 08/27/16   Daymon Larsen, MD  methocarbamol (ROBAXIN) 500 MG tablet Take 500-750 mg by mouth 2 (two) times daily as needed. For muscle spasms 02/28/17   [provider]   metoprolol succinate (TOPROL-XL) 100 MG 24 hr tablet Take 100 mg by mouth daily. Take with or immediately following a meal.    [provider]  nitroGLYCERIN (NITROLINGUAL) 0.4 MG/SPRAY spray Place 1 spray under the tongue every 5 (five) minutes x 3 doses as needed for chest pain.    [provider]  Oxycodone HCl 10 MG TABS Take 10 mg by mouth 4 (four) times daily as needed. For pain. 06/21/16   [provider]  pantoprazole (PROTONIX) 40 MG tablet Take 40 mg by mouth 2 (two) times daily.    [provider]  Pramoxine-Dimethicone 1-6 % CREA Place 1 application rectally 2 (two) times daily as needed (itching, burning, irritation, or other rectal discomfort caused by hemorrhoids).     [provider]  rosuvastatin (CRESTOR) 20 MG tablet Take 20 mg by mouth daily.    [provider]  SENSIPAR 30 MG tablet Take 30 mg by mouth daily. 07/01/16   [provider]  traMADol (ULTRAM) 50 MG tablet Take 1 tablet (50 mg total) by mouth every 6 (six) hours as needed. 08/27/16   Daymon Larsen, MD  VITAMIN E PO Take 1 capsule by mouth daily.    [provider]    Allergies  Allergen Reactions  . Nystatin Hives and Itching  . Sulfa Antibiotics Swelling, Hives and Rash  . Niaspan [Niacin] Other (See Comments) and Hives    Family History  Problem Relation Age of Onset  . Cancer Mother   . Cancer Father   . Diabetes Brother   . Heart disease Brother     Social History Social History  Substance Use Topics  . Smoking status: Current Every Day Smoker    Packs/day: 0.25    Types: Cigarettes  . Smokeless tobacco: Never Used  . Alcohol use No    Review of Systems Constitutional: Negative for fever. Cardiovascular: Negative for chest pain. Respiratory: Negative for shortness of breath. Gastrointestinal: abdominal pain at her peritoneal dialysis catheter site Genitourinary: Negative for dysuria. Musculoskeletal: Negative for  back pain. All other ROS negative  ____________________________________________   PHYSICAL EXAM:  VITAL SIGNS: ED Triage Vitals [06/27/17 1541]  Enc Vitals Group     BP 135/67     Pulse Rate 63     Resp 18     Temp 97.7 F (36.5 C)     Temp Source Oral     SpO2 98 %     Weight 224 lb (101.6 kg)     Height 5\' 6"  (1.676 m)     Head Circumference      Peak Flow      Pain Score 9     Pain Loc      Pain Edu?      Excl. in [place]?     Constitutional: Alert and oriented. Well appearing and in no distress.  Eyes: Normal exam ENT   Head: Normocephalic and atraumatic.   Mouth/Throat: Mucous membranes are moist. Cardiovascular: Normal rate, regular rhythm. No murmurs, rubs, or gallops. Respiratory: Normal respiratory effort without tachypnea nor retractions. Breath sounds are clear  Gastrointestinal:soft, mild tenderness to the right lower quadrant near the patient's peritoneal dialysis catheter. No erythema or obvious signs of cellulitis. Musculoskeletal: Nontender with normal range of motion in all extremities.  Neurologic:  Normal speech and language. No gross focal neurologic deficits  Skin:  Skin is warm, dry and intact.  Psychiatric: Mood and affect are normal.  ____________________________________________   INITIAL IMPRESSION / ASSESSMENT AND PLAN / ED COURSE  Pertinent labs & imaging results that were available during my care of the patient were reviewed by me and considered in my medical decision making (see chart for details).  the patient presents the emergency department for abdominal pain around her peritoneal dialysis catheter site. Patient states a history of SBP in the past. Differential this time would include SBP, scar tissue from peritoneal dialysis catheter, UTI, intra-abdominal infection. We will check labs, we will have the dialysis nurse obtain a peritoneal fluid sample. We will treat with pain medication and continue to closely monitor in the emergency  department. Overall the patient appears extremely well, no distress, minimal discomfort on abdominal exam.  I have reviewed the patient's records including her [facility] admission 05/28/17 in which her peritoneal dialysis catheter fluid and appeared to be consistent with SBP. Patient's culture however grew out negative. She also had a CT scan of the abdomen/pelvis at this time as well showing possible diverticulitis, but was ultimately decided that the CT findings were due to peritonitis.  ----------------------------------------- 11:40 PM on 06/27/2017 -----------------------------------------  Patient's labs are largely at her baseline. The white blood cell count elevation. Vitals are normal, afebrile normal heart rate and respiratory rate. Dialysis nurses finally been down to draw the fluid. Awaiting fluid analysis results. Patient care signed out to oncoming physician.  ____________________________________________   FINAL CLINICAL IMPRESSION(S) / ED DIAGNOSES  abdominal pain    Harvest Dark, MD 06/27/17 2340

## 2017-06-27 NOTE — ED Notes (Signed)
Call to [facility] to ensure fluid order is in place. State they will be here to collect sample and order is entered correctly

## 2017-06-27 NOTE — ED Notes (Addendum)
Dialysis nurse here at pts bedside

## 2017-06-28 DIAGNOSIS — N186 End stage renal disease: Secondary | ICD-10-CM

## 2017-06-28 DIAGNOSIS — I2510 Atherosclerotic heart disease of native coronary artery without angina pectoris: Secondary | ICD-10-CM

## 2017-06-28 DIAGNOSIS — I10 Essential (primary) hypertension: Secondary | ICD-10-CM | POA: Diagnosis not present

## 2017-06-28 DIAGNOSIS — T82868A Thrombosis of vascular prosthetic devices, implants and grafts, initial encounter: Secondary | ICD-10-CM | POA: Diagnosis not present

## 2017-06-28 DIAGNOSIS — R109 Unspecified abdominal pain: Secondary | ICD-10-CM | POA: Diagnosis present

## 2017-06-28 LAB — PHOSPHORUS: Phosphorus: 7.2 mg/dL — ABNORMAL HIGH (ref 2.5–4.6)

## 2017-06-28 MED ORDER — HEPARIN SODIUM (PORCINE) 1000 UNIT/ML DIALYSIS
1000.0000 [IU] | INTRAMUSCULAR | Status: DC | PRN
  Filled 2017-06-28: qty 1

## 2017-06-28 MED ORDER — HEPARIN SODIUM (PORCINE) 5000 UNIT/ML IJ SOLN
5000.0000 [IU] | Freq: Three times a day (TID) | INTRAMUSCULAR | Status: DC
  Administered 2017-06-28 – 2017-07-01 (×6): 5000 [IU] via SUBCUTANEOUS
  Filled 2017-06-28 (×6): qty 1

## 2017-06-28 MED ORDER — MOMETASONE FURO-FORMOTEROL FUM 200-5 MCG/ACT IN AERO
2.0000 | INHALATION_SPRAY | Freq: Two times a day (BID) | RESPIRATORY_TRACT | Status: DC
  Administered 2017-06-28 – 2017-07-01 (×6): 2 via RESPIRATORY_TRACT
  Filled 2017-06-28 (×3): qty 8.8

## 2017-06-28 MED ORDER — DEXTROSE 5 % IV SOLN
1.0000 g | INTRAVENOUS | Status: DC
  Administered 2017-06-28 – 2017-06-30 (×3): 1 g via INTRAVENOUS
  Filled 2017-06-28 (×4): qty 1

## 2017-06-28 MED ORDER — CEFTRIAXONE SODIUM 2 G IJ SOLR
2.0000 g | INTRAMUSCULAR | Status: DC
  Filled 2017-06-28: qty 2

## 2017-06-28 MED ORDER — ACETAMINOPHEN 650 MG RE SUPP: 650.0000 mg | Freq: Four times a day (QID) | RECTAL | Status: DC | PRN

## 2017-06-28 MED ORDER — SODIUM CHLORIDE 0.9 % IV SOLN: 100.0000 mL | INTRAVENOUS | Status: DC | PRN

## 2017-06-28 MED ORDER — MECLIZINE HCL 12.5 MG PO TABS
12.5000 mg | ORAL_TABLET | Freq: Three times a day (TID) | ORAL | Status: DC | PRN
  Filled 2017-06-28: qty 1

## 2017-06-28 MED ORDER — CEFTRIAXONE SODIUM IN DEXTROSE 20 MG/ML IV SOLN
1.0000 g | INTRAVENOUS | Status: DC
  Administered 2017-06-28: 1 g via INTRAVENOUS
  Filled 2017-06-28 (×2): qty 50

## 2017-06-28 MED ORDER — FOLIC ACID 1 MG PO TABS
1.0000 mg | ORAL_TABLET | Freq: Every day | ORAL | Status: DC
  Administered 2017-06-28 – 2017-07-01 (×4): 1 mg via ORAL
  Filled 2017-06-28 (×4): qty 1

## 2017-06-28 MED ORDER — SEVELAMER CARBONATE 2.4 G PO PACK
2.4000 g | PACK | Freq: Two times a day (BID) | ORAL | Status: DC
  Administered 2017-06-28 – 2017-07-01 (×3): 2.4 g via ORAL
  Filled 2017-06-28 (×8): qty 1

## 2017-06-28 MED ORDER — LEVOCETIRIZINE DIHYDROCHLORIDE 5 MG PO TABS: 5.0000 mg | ORAL_TABLET | Freq: Every day | ORAL | Status: DC

## 2017-06-28 MED ORDER — ASPIRIN EC 81 MG PO TBEC
81.0000 mg | DELAYED_RELEASE_TABLET | Freq: Every day | ORAL | Status: DC
  Administered 2017-06-28 – 2017-07-01 (×4): 81 mg via ORAL
  Filled 2017-06-28 (×4): qty 1

## 2017-06-28 MED ORDER — LIDOCAINE-PRILOCAINE 2.5-2.5 % EX CREA
1.0000 "application " | TOPICAL_CREAM | CUTANEOUS | Status: DC | PRN
  Filled 2017-06-28: qty 5

## 2017-06-28 MED ORDER — NITROGLYCERIN 0.4 MG/SPRAY TL SOLN: 1.0000 | Status: DC | PRN

## 2017-06-28 MED ORDER — VENLAFAXINE HCL ER 75 MG PO CP24
75.0000 mg | ORAL_CAPSULE | Freq: Every day | ORAL | Status: DC
  Administered 2017-06-28 – 2017-07-01 (×4): 75 mg via ORAL
  Filled 2017-06-28 (×4): qty 1

## 2017-06-28 MED ORDER — ALBUTEROL SULFATE (2.5 MG/3ML) 0.083% IN NEBU: 2.5000 mg | INHALATION_SOLUTION | RESPIRATORY_TRACT | Status: DC | PRN

## 2017-06-28 MED ORDER — ALTEPLASE 2 MG IJ SOLR
2.0000 mg | Freq: Once | INTRAMUSCULAR | Status: DC | PRN
  Filled 2017-06-28: qty 2

## 2017-06-28 MED ORDER — HYDROCORTISONE ACE-PRAMOXINE 2.5-1 % RE CREA
1.0000 "application " | TOPICAL_CREAM | Freq: Two times a day (BID) | RECTAL | Status: DC | PRN
  Filled 2017-06-28: qty 30

## 2017-06-28 MED ORDER — PENTAFLUOROPROP-TETRAFLUOROETH EX AERO
1.0000 "application " | INHALATION_SPRAY | CUTANEOUS | Status: DC | PRN
  Filled 2017-06-28: qty 30

## 2017-06-28 MED ORDER — LORATADINE 10 MG PO TABS
10.0000 mg | ORAL_TABLET | Freq: Every day | ORAL | Status: DC
  Administered 2017-06-28 – 2017-07-01 (×4): 10 mg via ORAL
  Filled 2017-06-28 (×4): qty 1

## 2017-06-28 MED ORDER — NITROGLYCERIN 0.4 MG SL SUBL: 0.4000 mg | SUBLINGUAL_TABLET | SUBLINGUAL | Status: DC | PRN

## 2017-06-28 MED ORDER — GABAPENTIN 100 MG PO CAPS
100.0000 mg | ORAL_CAPSULE | Freq: Every morning | ORAL | Status: DC
  Administered 2017-06-28 – 2017-07-01 (×4): 100 mg via ORAL
  Filled 2017-06-28 (×5): qty 1

## 2017-06-28 MED ORDER — DEXTROSE 5 % IV SOLN
1.0000 g | Freq: Once | INTRAVENOUS | Status: DC
  Filled 2017-06-28: qty 10

## 2017-06-28 MED ORDER — OXYCODONE HCL 5 MG PO TABS
10.0000 mg | ORAL_TABLET | Freq: Four times a day (QID) | ORAL | Status: DC | PRN
  Administered 2017-06-28 – 2017-06-29 (×3): 10 mg via ORAL
  Filled 2017-06-28 (×3): qty 2

## 2017-06-28 MED ORDER — DOCUSATE SODIUM 100 MG PO CAPS
100.0000 mg | ORAL_CAPSULE | Freq: Two times a day (BID) | ORAL | Status: DC
  Administered 2017-06-28 – 2017-07-01 (×6): 100 mg via ORAL
  Filled 2017-06-28 (×7): qty 1

## 2017-06-28 MED ORDER — ACETAMINOPHEN 325 MG PO TABS
650.0000 mg | ORAL_TABLET | Freq: Four times a day (QID) | ORAL | Status: DC | PRN
  Administered 2017-07-01: 650 mg via ORAL
  Filled 2017-06-28: qty 2

## 2017-06-28 MED ORDER — LIDOCAINE HCL (PF) 1 % IJ SOLN
5.0000 mL | INTRAMUSCULAR | Status: DC | PRN
  Filled 2017-06-28: qty 5

## 2017-06-28 MED ORDER — LUBIPROSTONE 24 MCG PO CAPS
24.0000 ug | ORAL_CAPSULE | Freq: Every day | ORAL | Status: DC | PRN
  Filled 2017-06-28: qty 1

## 2017-06-28 MED ORDER — ROSUVASTATIN CALCIUM 20 MG PO TABS
20.0000 mg | ORAL_TABLET | Freq: Every day | ORAL | Status: DC
  Administered 2017-06-28 – 2017-07-01 (×4): 20 mg via ORAL
  Filled 2017-06-28 (×4): qty 1

## 2017-06-28 MED ORDER — DEXTROSE 5 % IV SOLN: 2.0000 g | INTRAVENOUS | Status: DC

## 2017-06-28 MED ORDER — ONDANSETRON HCL 4 MG PO TABS: 4.0000 mg | ORAL_TABLET | Freq: Four times a day (QID) | ORAL | Status: DC | PRN

## 2017-06-28 MED ORDER — TIOTROPIUM BROMIDE MONOHYDRATE 18 MCG IN CAPS
1.0000 | ORAL_CAPSULE | Freq: Every day | RESPIRATORY_TRACT | Status: DC
  Administered 2017-06-28 – 2017-07-01 (×3): 18 ug via RESPIRATORY_TRACT
  Filled 2017-06-28 (×2): qty 5

## 2017-06-28 MED ORDER — ONDANSETRON HCL 4 MG/2ML IJ SOLN: 4.0000 mg | Freq: Four times a day (QID) | INTRAMUSCULAR | Status: DC | PRN

## 2017-06-28 MED ORDER — AMLODIPINE BESYLATE 10 MG PO TABS
10.0000 mg | ORAL_TABLET | Freq: Every day | ORAL | Status: DC
  Administered 2017-06-28 – 2017-07-01 (×3): 10 mg via ORAL
  Filled 2017-06-28 (×3): qty 1

## 2017-06-28 MED ORDER — METOPROLOL SUCCINATE ER 100 MG PO TB24
100.0000 mg | ORAL_TABLET | Freq: Every day | ORAL | Status: DC
  Administered 2017-06-28 – 2017-07-01 (×3): 100 mg via ORAL
  Filled 2017-06-28 (×4): qty 1

## 2017-06-28 MED ORDER — CALCIUM CARBONATE ANTACID 500 MG PO CHEW
600.0000 mg | CHEWABLE_TABLET | Freq: Every day | ORAL | Status: DC
  Administered 2017-06-28 – 2017-07-01 (×4): 600 mg via ORAL
  Filled 2017-06-28 (×3): qty 3

## 2017-06-28 MED ORDER — METHOCARBAMOL 500 MG PO TABS
500.0000 mg | ORAL_TABLET | Freq: Two times a day (BID) | ORAL | Status: DC | PRN
  Filled 2017-06-28: qty 1.5

## 2017-06-28 MED ORDER — PANTOPRAZOLE SODIUM 40 MG PO TBEC
40.0000 mg | DELAYED_RELEASE_TABLET | Freq: Two times a day (BID) | ORAL | Status: DC
  Administered 2017-06-28 – 2017-07-01 (×7): 40 mg via ORAL
  Filled 2017-06-28 (×7): qty 1

## 2017-06-28 MED ORDER — CINACALCET HCL 30 MG PO TABS
30.0000 mg | ORAL_TABLET | Freq: Every day | ORAL | Status: DC
  Administered 2017-06-28 – 2017-07-01 (×3): 30 mg via ORAL
  Filled 2017-06-28 (×4): qty 1

## 2017-06-28 MED ORDER — SODIUM CHLORIDE 0.9 % IV SOLN
1750.0000 mg | Freq: Once | INTRAVENOUS | Status: AC
  Administered 2017-06-28: 18:00:00 1750 mg via INTRAVENOUS
  Filled 2017-06-28 (×3): qty 1750

## 2017-06-28 NOTE — Care Management Obs Status (Signed)
[place] NOTIFICATION   Patient Details  Name: Kimberly Shaffer MRN: [id number] Date of Birth: Nov 16, 1950   Medicare Observation Status Notification Given:  Yes    Shelbie Ammons, RN 06/28/2017, 12:20 PM

## 2017-06-28 NOTE — Care Management Note (Addendum)
Case Management Note  Patient Details  Name: Kimberly Shaffer MRN: [id number] Date of Birth: [birth year]  Subjective/Objective:                  Admitted to [facility] under observation status with the diagnosis of abdominal pain. Lives at home with quad son.States that Dr. Daryel November is primary care physician.  Daughter is Emiko [phone]). Prescriptions are filled at [facility] on [address]. No home oxygen. No home Health. No skilled facility. No medical equipment in the home. Hemodialysis at [facility]  Tuesday - Thursday-Saturday x 3 years.  Trying to convert to peritoneal dialysis, Takes care of all basic activities of daily living herself, drives. Daughters help with errands. No falls. Good appetite. Family will transport.  Action/Plan: Unsure about follow-up needs at this time. Will continue to follow.    Expected Discharge Date:                  Expected Discharge Plan:     In-House Referral:     Discharge planning Services     Post Acute Care Choice:    Choice offered to:     DME Arranged:    DME Agency:     HH Arranged:    HH Agency:     Status of Service:     If discussed at [employer], dates discussed:    Additional Comments:  Shelbie Ammons, RN MSN CCM Care Management [phone] 06/28/2017, 12:12 PM

## 2017-06-28 NOTE — Progress Notes (Signed)
Pre hd assessment. Patient currently has no complaints

## 2017-06-28 NOTE — Progress Notes (Signed)
[place] [place] NAME: Tyann Niehaus    MR#:  [id number]  DATE OF BIRTH:  June 02, 1951  SUBJECTIVE:   Patient here due to abdominal pain with pain specifically closer to her peritoneal dialysis catheter site. No acute drainage noted there. Seen by nephrology and plan for PD catheter removal. Patient is already on hemodialysis.  REVIEW OF SYSTEMS:    Review of Systems  Constitutional: Negative for chills and fever.  HENT: Negative for congestion and tinnitus.   Eyes: Negative for blurred vision and double vision.  Respiratory: Negative for cough, shortness of breath and wheezing.   Cardiovascular: Negative for chest pain, orthopnea and PND.  Gastrointestinal: Negative for abdominal pain, diarrhea, nausea and vomiting.  Genitourinary: Negative for dysuria and hematuria.  Neurological: Negative for dizziness, sensory change and focal weakness.  All other systems reviewed and are negative.   Nutrition: Renal/Carb modified.  Tolerating Diet: Yes Tolerating PT: Await Eval.    DRUG ALLERGIES:   Allergies  Allergen Reactions  . Nystatin Hives and Itching  . Sulfa Antibiotics Swelling, Hives and Rash  . Niaspan [Niacin] Other (See Comments) and Hives    VITALS:  Blood pressure 135/62, pulse (!) 55, temperature 97.6 F (36.4 C), temperature source Oral, resp. rate 18, height 5' 6.5" (1.689 m), weight 102.9 kg (226 lb 12.8 oz), SpO2 100 %.  PHYSICAL EXAMINATION:   Physical Exam  GENERAL:  66 y.o.-year-old patient lying in bed in no acute distress.  EYES: Pupils equal, round, reactive to light and accommodation. No scleral icterus. Extraocular muscles intact.  HEENT: Head atraumatic, normocephalic. Oropharynx and nasopharynx clear.  NECK:  Supple, no jugular venous distention. No thyroid enlargement, no tenderness.  LUNGS: Normal breath sounds bilaterally, no wheezing, rales, rhonchi. No use of accessory muscles of respiration.   CARDIOVASCULAR: S1, S2 normal. No murmurs, rubs, or gallops.  ABDOMEN: Soft, nontender, nondistended. Bowel sounds present. No organomegaly or mass. + PD cath in place with no acute drainage noted near site.  EXTREMITIES: No cyanosis, clubbing or edema b/l.    NEUROLOGIC: Cranial nerves II through XII are intact. No focal Motor or sensory deficits b/l.   PSYCHIATRIC: The patient is alert and oriented x 3.  SKIN: No obvious rash, lesion, or ulcer.    LABORATORY PANEL:   CBC  Recent Labs Lab 06/27/17 1803  WBC 5.1  HGB 10.8*  HCT 33.2*  PLT 186   ------------------------------------------------------------------------------------------------------------------  Chemistries   Recent Labs Lab 06/27/17 1803  NA 139  K 4.5  CL 103  CO2 20*  GLUCOSE 133*  BUN 88*  CREATININE 9.99*  CALCIUM 8.0*  AST 26  ALT 20  ALKPHOS 70  BILITOT 0.6   ------------------------------------------------------------------------------------------------------------------  Cardiac Enzymes No results for input(s): TROPONINI in the last 168 hours. ------------------------------------------------------------------------------------------------------------------  RADIOLOGY:  No results found.   ASSESSMENT AND PLAN:   66 year old female with past medical history of end-stage renal disease on peritoneal dialysis, diabetes, COPD, hypertension, hyperlipidemia, history of previous MI, history of coronary artery disease who presented to the hospital due to abdominal pain.  1. Abdominal pain-etiology unclear but suspected to be secondary to peritonitis secondary to PD catheter malfunction. -Clinically patient is afebrile and hemodynamically stable with a normal white cell count. -Continue IV vancomycin, ceftazidime to cover for peritonitis. Await PD catheter removal and fluid analysis. Appreciate nephrology input.  2. End-stage renal disease-patient used to be on peritoneal dialysis but now has  been so short hemodialysis. -PD  catheter to be removed due to above complaint. Continue hemodialysis with a left upper extremity AV fistula. Nephrology has been consulted, continue dialysis on Tuesday Thursday Saturday.  3. Essential hypertension-continue Norvasc, metoprolol.  4. Secondary hyperparathyroidism-continue Sensipar, Renvela..  5. COPD-no acute exacerbation-continue Dulera, Spiriva.  6. GERD-continue Protonix.  7. Hyperlipidemia-continue Crestor.\  8. Neuropathy-continue gabapentin.   All the records are reviewed and case discussed with Care Management/Social Worker. Management plans discussed with the patient, family and they are in agreement.  CODE STATUS: Full code  DVT Prophylaxis: Hep. SQ  TOTAL TIME TAKING CARE OF THIS PATIENT: 30 minutes.   POSSIBLE D/C IN 1-2 DAYS, DEPENDING ON CLINICAL CONDITION.   Henreitta Leber M.D on 06/28/2017 at 1:28 PM  Between 7am to 6pm - Pager - [phone]  After 6pm go to [email or web address] - [employer] [facility]  Office  [phone]  CC: Primary care physician; [facility]

## 2017-06-28 NOTE — Progress Notes (Signed)
[facility]  ROUNDING NOTE   Subjective:  Patient well known to [place]. She had a PD catheter placement back in August. After our PD catheter placement she did have an episode of peritonitis and was seen for this at [facility]. She developed abdominal pain again. Yesterday we were unable to infuse any dialysate by gravity. It appears that her PD catheter is occluded.   Objective:  Vital signs in last 24 hours:  Temp:  [97.7 F (36.5 C)-98.8 F (37.1 C)] 97.7 F (36.5 C) (10/10 0750) Pulse Rate:  [54-70] 64 (10/10 1005) Resp:  [16-18] 18 (10/10 1005) BP: (108-155)/(42-67) 130/59 (10/10 1005) SpO2:  [88 %-100 %] 100 % (10/10 0750) Weight:  [101.6 kg (224 lb)-102.9 kg (226 lb 12.8 oz)] 102.9 kg (226 lb 12.8 oz) (10/10 0750)  Weight change:  Filed Weights   06/27/17 1541 06/28/17 0750  Weight: 101.6 kg (224 lb) 102.9 kg (226 lb 12.8 oz)    Intake/Output: No intake/output data recorded.   Intake/Output this shift:  No intake/output data recorded.  Physical Exam: General: No acute distress  Head: Normocephalic, atraumatic. Moist oral mucosal membranes  Eyes: Anicteric  Neck: Supple, trachea midline  Lungs:  Clear to auscultation, normal effort  Heart: S1S2 no rubs  Abdomen:  Mild diffuse tenderness, BS present, PD catheter in place  Extremities: No peripheral edema.  Neurologic: Awake, alert, following commands  Skin: No lesions  Access: PD catheter in place, LUE AVF    Basic Metabolic Panel:  Recent Labs Lab 06/27/17 1803  NA 139  K 4.5  CL 103  CO2 20*  GLUCOSE 133*  BUN 88*  CREATININE 9.99*  CALCIUM 8.0*    Liver Function Tests:  Recent Labs Lab 06/27/17 1803  AST 26  ALT 20  ALKPHOS 70  BILITOT 0.6  PROT 8.1  ALBUMIN 3.5   No results for input(s): LIPASE, AMYLASE in the last 168 hours. No results for input(s): AMMONIA in the last 168 hours.  CBC:  Recent Labs Lab 06/27/17 1803  WBC 5.1  HGB 10.8*  HCT 33.2*  MCV  91.6  PLT 186    Cardiac Enzymes: No results for input(s): CKTOTAL, CKMB, CKMBINDEX, TROPONINI in the last 168 hours.  BNP: Invalid input(s): POCBNP  CBG: No results for input(s): GLUCAP in the last 168 hours.  Microbiology: Results for orders placed or performed during the hospital encounter of 06/27/17  Blood culture (routine x 2)     Status: None (Preliminary result)   Collection Time: 06/28/17  1:42 AM  Result Value Ref Range Status   Specimen Description BLOOD RIGHT ANTECUBITAL  Final   Special Requests   Final    BOTTLES DRAWN AEROBIC AND ANAEROBIC Blood Culture adequate volume   Culture NO GROWTH < 12 HOURS  Final   Report Status PENDING  Incomplete  Blood culture (routine x 2)     Status: None (Preliminary result)   Collection Time: 06/28/17  1:42 AM  Result Value Ref Range Status   Specimen Description BLOOD BLOOD RIGHT HAND  Final   Special Requests   Final    BOTTLES DRAWN AEROBIC AND ANAEROBIC Blood Culture results may not be optimal due to an inadequate volume of blood received in culture bottles   Culture NO GROWTH < 12 HOURS  Final   Report Status PENDING  Incomplete    Coagulation Studies: No results for input(s): LABPROT, INR in the last 72 hours.  Urinalysis: No results for input(s): COLORURINE, LABSPEC,  PHURINE, GLUCOSEU, HGBUR, BILIRUBINUR, KETONESUR, PROTEINUR, UROBILINOGEN, NITRITE, LEUKOCYTESUR in the last 72 hours.  Invalid input(s): APPERANCEUR    Imaging: No results found.   Medications:   . cefTRIAXone (ROCEPHIN)  IV    . [START ON 06/29/2017] cefTRIAXone (ROCEPHIN)  IV     . amLODipine  10 mg Oral Daily  . aspirin EC  81 mg Oral Daily  . calcium carbonate  600 mg of elemental calcium Oral Q breakfast  . cinacalcet  30 mg Oral Daily  . docusate sodium  100 mg Oral BID  . folic acid  1 mg Oral Daily  . gabapentin  100 mg Oral q morning - 10a  . heparin  5,000 Units Subcutaneous Q8H  . loratadine  10 mg Oral Daily  . metoprolol  succinate  100 mg Oral Daily  . mometasone-formoterol  2 puff Inhalation BID  . pantoprazole  40 mg Oral BID  . rosuvastatin  20 mg Oral Daily  . sevelamer carbonate  2.4 g Oral BID WC  . tiotropium  1 capsule Inhalation Daily  . venlafaxine XR  75 mg Oral Daily   acetaminophen **OR** acetaminophen, albuterol, hydrocortisone-pramoxine, lubiprostone, meclizine, methocarbamol, nitroGLYCERIN, ondansetron **OR** ondansetron (ZOFRAN) IV, oxyCODONE  Assessment/ Plan:  66 y.o. female with hypertension, coronary artery disease s/p CABG, peripheral vascular disease, diabetes mellitus type II, hyperlipidemia, bilateral total knee replacement, ESRD, anemia of CKD, SHPTH, admitted for dysfunctional PD catheter.   [facility]  1. ESRD on HD TTS:  Patient did not have hemodialysis yesterday. Therefore we will plan a dialysis session today. She also had a peritoneal dialysis catheter placed back in August. Unfortunately it has not been very functional and in fact yesterday we could not instill dialysate. She's also had 1 episode of peritonitis and has abdominal pain now as well. At this point in time the best course of action will be to discontinue the peritoneal dialysis catheter. This was discussed with the patient's outpatient peritoneal dialysis nurse as well. We will consult vascular surgery for this purpose.  2.  Anemia of chronic kidney disease. Hemoglobin currently 10.8. Hold off on Epogen for now.  3. Secondary hyperparathyroidism. Check intact PTH and phosphorus with dialysis today. Otherwise continue Renvela and Sensipar.  4.  Hypertension. Continue amlodipine, metoprolol.   LOS: 0 Marlaysia Lenig 10/10/201811:13 AM

## 2017-06-28 NOTE — Progress Notes (Signed)
HD completed without issue. UF goal decreased slightly d/t bp drop. Total UF 1L. Patient has no complaints.

## 2017-06-28 NOTE — Progress Notes (Addendum)
Pharmacy Antibiotic Note  Kimberly Shaffer is a 66 y.o. female admitted on 06/27/2017 with Abdominal Pain. Pharmacy consulted for vancomycin and ceftazidime dosing for suspected PD peritonitis.   Plan: Plan is for patient to get HD on T, Th, Sat.  Will give Vancomycin 1750 mg x 1 dose, followed by  vancomycin 1g IV after every HD.  Will draw vanc trough prior to 3rd HD session.  Start Ceftazidime 1g IV every 24 hours.   Height: 5' 6.5" (168.9 cm) Weight: 226 lb 12.8 oz (102.9 kg) IBW/kg (Calculated) : 60.45  Temp (24hrs), Avg:98.4 F (36.9 C), Min:97.7 F (36.5 C), Max:98.8 F (37.1 C)   Recent Labs Lab 06/27/17 1803  WBC 5.1  CREATININE 9.99*    Estimated Creatinine Clearance: 6.8 mL/min (A) (by C-G formula based on SCr of 9.99 mg/dL (H)).    Allergies  Allergen Reactions  . Nystatin Hives and Itching  . Sulfa Antibiotics Swelling, Hives and Rash  . Niaspan [Niacin] Other (See Comments) and Hives    Antimicrobials this admission: 10/10 ceftriaxone >>  10/10 vancomycin  >>   Dose adjustments this admission:   Microbiology results: 10/10  BCx: pending  Thank you for allowing pharmacy to be a part of this patient's care.  Pernell Dupre, PharmD, BCPS Clinical Pharmacist 06/28/2017 11:40 AM

## 2017-06-28 NOTE — Progress Notes (Signed)
Pre hd 

## 2017-06-28 NOTE — Progress Notes (Signed)
Pharmacy note:  Ceftriaxone 1 gram IV q24h ordered for Intra-abdominal infection.  Will adjust dose to Ceftriaxone 2 gram IV q24h for Intra-abdominal infection.  Chinita Greenland PharmD Clinical Pharmacist 06/28/2017

## 2017-06-28 NOTE — ED Provider Notes (Addendum)
-----------------------------------------   1:10 AM on 06/28/2017 -----------------------------------------   Blood pressure (!) 115/57, pulse 69, temperature 98.8 F (37.1 C), temperature source Oral, resp. rate 16, height 5\' 6"  (1.676 m), weight 101.6 kg (224 lb), SpO2 96 %.  Assuming care from Dr. Kerman Passey.  In short, Kimberly Shaffer is a 66 y.o. female with a chief complaint of pain at catheter site .  Refer to the original H&P for additional details.  The current plan of care is to await the results of the peritoneal fluid.  We were unable to draw fluid from the patient's peritoneal catheter because it is clotted. I contacted Dr. Holley Raring the nephrologist to ask his advise whether to admit or discharge the patient with follow up. His recommendation is to admit the patient give the persistent nature of the patient's catheter problems and also because he has no available office appointments.   The patient will receive a dose of cetriaxone.   She will be admitted        Loney Hering, MD 06/28/17 [id number]    Loney Hering, MD 06/28/17 [phone]

## 2017-06-28 NOTE — Progress Notes (Signed)
HD initiated without issue via L AVF using 15g needles x2. No heparin tx per orders. UF goal 1.5L and 3.5 hour treatment time. Patient currently has no complaints. States she feels "tired."

## 2017-06-28 NOTE — H&P (Addendum)
Kimberly Shaffer is an 66 y.o. female.   Chief Complaint: Abdominal pain HPI: The patient with past medical history of end-stage renal disease on dialysis, diabetes, hypertension, coronary artery disease status post MI and COPD presents to the emergency department complaining of abdominal pain. The patient complains of pain around her peritoneal dialysis catheter site. She had a recent admission to [facility] that began with the same complaints but resulted in sepsis secondary to SBP. Currently she denies fever, nausea, vomiting or diarrhea. In attempts to obtain cultures from the peritoneal dialysis catheter unsuccessful due to poor return and likely clot. Nephrology was consulted who recommended removal of the catheter to preempt sepsis as the patient has a similar presentation to the last episode of sepsis. Thus the emergency department staff called the hospitalist service for admission.  Past Medical History:  Diagnosis Date  . Asthma   . CAD (coronary artery disease)   . Chronic lower back pain   . COPD (chronic obstructive pulmonary disease) (HCC)   . Depression   . Diabetes mellitus without complication (HCC)    NIDD  . H/O angina pectoris   . H/O blood clots   . Hypercholesteremia   . Hypertension   . Left rotator cuff tear   . Myocardial infarction (HCC)   . Obesity   . Renal insufficiency   . Stroke (HCC)   . Vertigo, aural     Past Surgical History:  Procedure Laterality Date  . A/V FISTULAGRAM Left 03/14/2017   Procedure: A/V Fistulagram;  Surgeon: Renford Dills, MD;  Location: [facility] INVASIVE CV LAB;  Service: Cardiovascular;  Laterality: Left;  . AV FISTULA PLACEMENT Left 11-04-2015  . AV FISTULA PLACEMENT Left 11/04/2015   Procedure: ARTERIOVENOUS (AV) FISTULA CREATION  ( BRACHIAL CEPHALIC );  Surgeon: Renford Dills, MD;  Location: [facility] ORS;  Service: Vascular;  Laterality: Left;  . CARDIAC CATHETERIZATION    . CARDIAC CATHETERIZATION Left 07/19/2016    Procedure: Left Heart Cath and Coronary Angiography;  Surgeon: Lamar Blinks, MD;  Location: [facility] INVASIVE CV LAB;  Service: Cardiovascular;  Laterality: Left;  . CORONARY ARTERY BYPASS GRAFT  2012  . CORONARY STENT PLACEMENT    . JOINT REPLACEMENT    . KNEE SURGERY Bilateral   . PERIPHERAL VASCULAR CATHETERIZATION N/A 11/13/2015   Procedure: Dialysis/Perma Catheter Insertion;  Surgeon: Renford Dills, MD;  Location: [facility] INVASIVE CV LAB;  Service: Cardiovascular;  Laterality: N/A;  . PERIPHERAL VASCULAR CATHETERIZATION N/A 12/04/2015   Procedure: Dialysis/Perma Catheter Insertion;  Surgeon: Renford Dills, MD;  Location: [facility] INVASIVE CV LAB;  Service: Cardiovascular;  Laterality: N/A;  . PERIPHERAL VASCULAR CATHETERIZATION Left 12/31/2015   Procedure: A/V Shuntogram/Fistulagram;  Surgeon: Annice Needy, MD;  Location: [facility] INVASIVE CV LAB;  Service: Cardiovascular;  Laterality: Left;  . PERIPHERAL VASCULAR CATHETERIZATION N/A 12/31/2015   Procedure: A/V Shunt Intervention;  Surgeon: Annice Needy, MD;  Location: [facility] INVASIVE CV LAB;  Service: Cardiovascular;  Laterality: N/A;  . PERIPHERAL VASCULAR CATHETERIZATION Left 02/25/2016   Procedure: A/V Shuntogram/Fistulagram;  Surgeon: Annice Needy, MD;  Location: [facility] INVASIVE CV LAB;  Service: Cardiovascular;  Laterality: Left;  . PERIPHERAL VASCULAR CATHETERIZATION N/A 03/18/2016   Procedure: Dialysis/Perma Catheter Removal;  Surgeon: Renford Dills, MD;  Location: [facility] INVASIVE CV LAB;  Service: Cardiovascular;  Laterality: N/A;  . REPLACEMENT TOTAL KNEE BILATERAL Bilateral     Family History  Problem Relation Age of Onset  . Cancer Mother   .  Cancer Father   . Diabetes Brother   . Heart disease Brother    Social History:  reports that she has been smoking Cigarettes.  She has been smoking about 0.25 packs per day. She has never used smokeless tobacco. She reports that she does not drink alcohol or use drugs.  Allergies:  Allergies   Allergen Reactions  . Nystatin Hives and Itching  . Sulfa Antibiotics Swelling, Hives and Rash  . Niaspan [Niacin] Other (See Comments) and Hives     (Not in a hospital admission)  Results for orders placed or performed during the hospital encounter of 06/27/17 (from the past 48 hour(s))  CBC     Status: Abnormal   Collection Time: 06/27/17  6:03 PM  Result Value Ref Range   WBC 5.1 3.6 - 11.0 K/uL   RBC 3.62 (L) 3.80 - 5.20 MIL/uL   Hemoglobin 10.8 (L) 12.0 - 16.0 g/dL   HCT 33.2 (L) 35.0 - 47.0 %   MCV 91.6 80.0 - 100.0 fL   MCH 29.8 26.0 - 34.0 pg   MCHC 32.6 32.0 - 36.0 g/dL   RDW 15.9 (H) 11.5 - 14.5 %   Platelets 186 150 - 440 K/uL  Comprehensive metabolic panel     Status: Abnormal   Collection Time: 06/27/17  6:03 PM  Result Value Ref Range   Sodium 139 135 - 145 mmol/L   Potassium 4.5 3.5 - 5.1 mmol/L   Chloride 103 101 - 111 mmol/L   CO2 20 (L) 22 - 32 mmol/L   Glucose, Bld 133 (H) 65 - 99 mg/dL   BUN 88 (H) 6 - 20 mg/dL   Creatinine, Ser 9.99 (H) 0.44 - 1.00 mg/dL   Calcium 8.0 (L) 8.9 - 10.3 mg/dL   Total Protein 8.1 6.5 - 8.1 g/dL   Albumin 3.5 3.5 - 5.0 g/dL   AST 26 15 - 41 U/L   ALT 20 14 - 54 U/L   Alkaline Phosphatase 70 38 - 126 U/L   Total Bilirubin 0.6 0.3 - 1.2 mg/dL   GFR calc non Af Amer 4 (L) >60 mL/min   GFR calc Af Amer 4 (L) >60 mL/min    Comment: (NOTE) The eGFR has been calculated using the CKD EPI equation. This calculation has not been validated in all clinical situations. eGFR's persistently <60 mL/min signify possible Chronic Kidney Disease.    Anion gap 16 (H) 5 - 15   No results found.  Review of Systems  Constitutional: Positive for chills (Resolved ). Negative for fever.  HENT: Negative for sore throat and tinnitus.   Eyes: Negative for blurred vision and redness.  Respiratory: Negative for cough and shortness of breath.   Cardiovascular: Negative for chest pain, palpitations, orthopnea and PND.  Gastrointestinal:  Positive for abdominal pain. Negative for diarrhea, nausea and vomiting.  Genitourinary: Negative for dysuria, frequency and urgency.  Musculoskeletal: Negative for joint pain and myalgias.  Skin: Negative for rash.       No lesions  Neurological: Negative for speech change, focal weakness and weakness.  Endo/Heme/Allergies: Does not bruise/bleed easily.       No temperature intolerance  Psychiatric/Behavioral: Negative for depression and suicidal ideas.    Blood pressure 115/60, pulse 62, temperature 98.8 F (37.1 C), temperature source Oral, resp. rate 16, height '5\' 6"'$  (1.676 m), weight 101.6 kg (224 lb), SpO2 95 %. Physical Exam  Vitals reviewed. Constitutional: She is oriented to person, place, and time. She appears well-developed and well-nourished.  No distress.  HENT:  Head: Normocephalic and atraumatic.  Mouth/Throat: Oropharynx is clear and moist.  Eyes: Pupils are equal, round, and reactive to light. Conjunctivae and EOM are normal. No scleral icterus.  Neck: Normal range of motion. Neck supple. No JVD present. No tracheal deviation present. No thyromegaly present.  Cardiovascular: Normal rate, regular rhythm and normal heart sounds.  Exam reveals no gallop and no friction rub.   No murmur heard. Respiratory: Effort normal. She has wheezes (Faint expiratory bilateral).  GI: Soft. Bowel sounds are normal. She exhibits no distension. There is tenderness (At the PD catheter site).  Genitourinary:  Genitourinary Comments: Deferred  Musculoskeletal: Normal range of motion. She exhibits no edema.  Lymphadenopathy:    She has no cervical adenopathy.  Neurological: She is alert and oriented to person, place, and time. No cranial nerve deficit. She exhibits normal muscle tone.  Skin: Skin is warm and dry. No rash noted. No erythema.  Psychiatric: She has a normal mood and affect. Her behavior is normal. Judgment and thought content normal.     Assessment/Plan This is a 66 year old  female admitted for abdominal pain. 1. Abdominal pain: The patient presented to the outside hospital recently with similar symptoms and was diagnosed with SBP. Her peritoneal dialysis catheter is not functioning at this time. Thus we will have interventional radiology remove catheter and obtain peritoneal fluid for culture. She is received ceftriaxone. She currently does not have signs or symptoms of sepsis.  2. ESRD: Receding hemodialysis via left arm fistula. Consult nephrology for continuation of dialysis. Continue Renvela and Sensipar 3. COPD: Continue Spiriva and inhaled corticosteroid. Albuterol as needed. 4. Hypertension: Controlled; continue amlodipine and metoprolol. 5. CAD: Stable; continue aspirin. Nitroglycerin glycerin as needed for chest pain. 6. Hyperlipidemia: Continue statin therapy 7. Depression: Continue Effexor 8. DVT prophylaxis: Heparin 9. GI prophylaxis: Pantoprazole per home regimen The patient is a full code. Time spent on admission orders and patient care approximately 45 minutes  Harrie Foreman, MD 06/28/2017, 6:23 AM

## 2017-06-28 NOTE — ED Notes (Signed)
dialysis nurse, Susie, spoke to Dr Dahlia Client. She was unble to obtain any fluid from cath, unable to flush or withdraw from catheter she recommends need for nephrology to assess.

## 2017-06-28 NOTE — Progress Notes (Signed)
Post HD assessment unchanged  

## 2017-06-28 NOTE — Procedures (Signed)
Made a PD fluid sample attempt by gravity w/o success and then attempted PD dialysate infusion to PD catheter on RLQ of ABD; unable to infuse any dialysate by gravity; connected 60 cc syringe to attempt effluent drainage, also without success; informed primary RN and ER physician that the catheter had the appearance of being occluded and would need intervention by a MD/nephrology if appropriate.

## 2017-06-28 NOTE — Progress Notes (Signed)
Patient is not in the room at this time.  I will plan to remove her PD catheter and surgery on Friday.  Full consult to follow

## 2017-06-28 NOTE — Progress Notes (Signed)
Upon arrival Kimberly Shaffer was lying in bed with pain in right shoulder (machine was peeping regularly due to 'talking with hands'). Chaplain held hand which reduced machine noice. Kimberly Shaffer requested an Scientist, physiological which was provided. Kimberly Shaffer was in pain and very tired so she did not appear to need explanation at time of visit. Kimberly Shaffer shared of her many life challenges. Chaplain offered listening presence and prayer. Additional chaplain support is available upon request.

## 2017-06-29 LAB — RENAL FUNCTION PANEL
Albumin: 3.4 g/dL — ABNORMAL LOW (ref 3.5–5.0)
Anion gap: 15 (ref 5–15)
BUN: 41 mg/dL — AB (ref 6–20)
CALCIUM: 8.1 mg/dL — AB (ref 8.9–10.3)
CHLORIDE: 97 mmol/L — AB (ref 101–111)
CO2: 23 mmol/L (ref 22–32)
CREATININE: 5.67 mg/dL — AB (ref 0.44–1.00)
GFR calc Af Amer: 8 mL/min — ABNORMAL LOW (ref >60)
GFR calc non Af Amer: 7 mL/min — ABNORMAL LOW (ref >60)
Glucose, Bld: 116 mg/dL — ABNORMAL HIGH (ref 65–99)
Phosphorus: 5 mg/dL — ABNORMAL HIGH (ref 2.5–4.6)
Potassium: 3.4 mmol/L — ABNORMAL LOW (ref 3.5–5.1)
SODIUM: 135 mmol/L (ref 135–145)

## 2017-06-29 LAB — PARATHYROID HORMONE, INTACT (NO CA): PTH: 75 pg/mL — AB (ref 15–65)

## 2017-06-29 LAB — CBC
HCT: 30.9 % — ABNORMAL LOW (ref 35.0–47.0)
Hemoglobin: 10.2 g/dL — ABNORMAL LOW (ref 12.0–16.0)
MCH: 29.6 pg (ref 26.0–34.0)
MCHC: 33 g/dL (ref 32.0–36.0)
MCV: 89.5 fL (ref 80.0–100.0)
PLATELETS: 175 10*3/uL (ref 150–440)
RBC: 3.45 MIL/uL — AB (ref 3.80–5.20)
RDW: 15.8 % — AB (ref 11.5–14.5)
WBC: 4.4 10*3/uL (ref 3.6–11.0)

## 2017-06-29 LAB — HEPATITIS B SURFACE ANTIGEN: Hepatitis B Surface Ag: NEGATIVE

## 2017-06-29 LAB — GLUCOSE, CAPILLARY: Glucose-Capillary: 157 mg/dL — ABNORMAL HIGH (ref 65–99)

## 2017-06-29 MED ORDER — VANCOMYCIN HCL IN DEXTROSE 1-5 GM/200ML-% IV SOLN
1000.0000 mg | INTRAVENOUS | Status: DC
  Administered 2017-06-29 – 2017-07-01 (×2): 1000 mg via INTRAVENOUS
  Filled 2017-06-29 (×2): qty 200

## 2017-06-29 MED ORDER — OXYCODONE HCL 5 MG PO TABS
10.0000 mg | ORAL_TABLET | Freq: Four times a day (QID) | ORAL | Status: DC | PRN
  Administered 2017-06-29 – 2017-07-01 (×5): 10 mg via ORAL
  Filled 2017-06-29 (×5): qty 2

## 2017-06-29 MED ORDER — CEFAZOLIN SODIUM-DEXTROSE 1-4 GM/50ML-% IV SOLN
1.0000 g | INTRAVENOUS | Status: AC
  Administered 2017-06-30: 1 g via INTRAVENOUS
  Filled 2017-06-29: qty 50

## 2017-06-29 NOTE — Progress Notes (Signed)
Pre HD  

## 2017-06-29 NOTE — Progress Notes (Signed)
HD initiated without issue via L AVF using 15g needles x2. Labs sent per order. Patient currently without complaints. MD at bedside.

## 2017-06-29 NOTE — Progress Notes (Signed)
Pre hd 

## 2017-06-29 NOTE — Progress Notes (Signed)
[facility]  ROUNDING NOTE   Subjective:  Patient still having some lower abdominal pain. Appreciate vascular surgery input. They will be removing her PD catheter tomorrow.   Objective:  Vital signs in last 24 hours:  Temp:  [97.6 F (36.4 C)-98.2 F (36.8 C)] 98.1 F (36.7 C) (10/11 0940) Pulse Rate:  [55-70] 63 (10/11 0940) Resp:  [14-20] 18 (10/11 0940) BP: (113-151)/(46-78) 151/53 (10/11 0940) SpO2:  [93 %-100 %] 93 % (10/11 0940) Weight:  [100.4 kg (221 lb 5.5 oz)-105.3 kg (232 lb 3.2 oz)] 105.3 kg (232 lb 3.2 oz) (10/11 0513)  Weight change: 1.27 kg (2 lb 12.8 oz) Filed Weights   06/28/17 1357 06/28/17 1738 06/29/17 0513  Weight: 102.6 kg (226 lb 3.1 oz) 100.4 kg (221 lb 5.5 oz) 105.3 kg (232 lb 3.2 oz)    Intake/Output: I/O last 3 completed shifts: In: 790 [P.O.:240; IV Piggyback:550] Out: 1000 [Other:1000]   Intake/Output this shift:  No intake/output data recorded.  Physical Exam: General: No acute distress  Head: Normocephalic, atraumatic. Moist oral mucosal membranes  Eyes: Anicteric  Neck: Supple, trachea midline  Lungs:  Clear to auscultation, normal effort  Heart: S1S2 no rubs  Abdomen:  Mild diffuse tenderness, BS present, PD catheter in place  Extremities: No peripheral edema.  Neurologic: Awake, alert, following commands  Skin: No lesions  Access: PD catheter in place, LUE AVF    Basic Metabolic Panel:  Recent Labs Lab 06/27/17 1803 06/28/17 1400  NA 139  --   K 4.5  --   CL 103  --   CO2 20*  --   GLUCOSE 133*  --   BUN 88*  --   CREATININE 9.99*  --   CALCIUM 8.0*  --   PHOS  --  7.2*    Liver Function Tests:  Recent Labs Lab 06/27/17 1803  AST 26  ALT 20  ALKPHOS 70  BILITOT 0.6  PROT 8.1  ALBUMIN 3.5   No results for input(s): LIPASE, AMYLASE in the last 168 hours. No results for input(s): AMMONIA in the last 168 hours.  CBC:  Recent Labs Lab 06/27/17 1803  WBC 5.1  HGB 10.8*  HCT 33.2*  MCV  91.6  PLT 186    Cardiac Enzymes: No results for input(s): CKTOTAL, CKMB, CKMBINDEX, TROPONINI in the last 168 hours.  BNP: Invalid input(s): POCBNP  CBG: No results for input(s): GLUCAP in the last 168 hours.  Microbiology: Results for orders placed or performed during the hospital encounter of 06/27/17  Blood culture (routine x 2)     Status: None (Preliminary result)   Collection Time: 06/28/17  1:42 AM  Result Value Ref Range Status   Specimen Description BLOOD RIGHT ANTECUBITAL  Final   Special Requests   Final    BOTTLES DRAWN AEROBIC AND ANAEROBIC Blood Culture adequate volume   Culture NO GROWTH 1 DAY  Final   Report Status PENDING  Incomplete  Blood culture (routine x 2)     Status: None (Preliminary result)   Collection Time: 06/28/17  1:42 AM  Result Value Ref Range Status   Specimen Description BLOOD BLOOD RIGHT HAND  Final   Special Requests   Final    BOTTLES DRAWN AEROBIC AND ANAEROBIC Blood Culture results may not be optimal due to an inadequate volume of blood received in culture bottles   Culture NO GROWTH 1 DAY  Final   Report Status PENDING  Incomplete    Coagulation Studies: No results  for input(s): LABPROT, INR in the last 72 hours.  Urinalysis: No results for input(s): COLORURINE, LABSPEC, PHURINE, GLUCOSEU, HGBUR, BILIRUBINUR, KETONESUR, PROTEINUR, UROBILINOGEN, NITRITE, LEUKOCYTESUR in the last 72 hours.  Invalid input(s): APPERANCEUR    Imaging: No results found.   Medications:   . sodium chloride    . sodium chloride    . cefTAZidime (FORTAZ)  IV Stopped (06/28/17 2343)   . amLODipine  10 mg Oral Daily  . aspirin EC  81 mg Oral Daily  . calcium carbonate  600 mg of elemental calcium Oral Q breakfast  . cinacalcet  30 mg Oral Daily  . docusate sodium  100 mg Oral BID  . folic acid  1 mg Oral Daily  . gabapentin  100 mg Oral q morning - 10a  . heparin  5,000 Units Subcutaneous Q8H  . loratadine  10 mg Oral Daily  . metoprolol  succinate  100 mg Oral Daily  . mometasone-formoterol  2 puff Inhalation BID  . pantoprazole  40 mg Oral BID  . rosuvastatin  20 mg Oral Daily  . sevelamer carbonate  2.4 g Oral BID WC  . tiotropium  1 capsule Inhalation Daily  . venlafaxine XR  75 mg Oral Daily   sodium chloride, sodium chloride, acetaminophen **OR** acetaminophen, albuterol, alteplase, heparin, hydrocortisone-pramoxine, lidocaine (PF), lidocaine-prilocaine, lubiprostone, meclizine, methocarbamol, nitroGLYCERIN, ondansetron **OR** ondansetron (ZOFRAN) IV, oxyCODONE, pentafluoroprop-tetrafluoroeth  Assessment/ Plan:  66 y.o. female with hypertension, coronary artery disease s/p CABG, peripheral vascular disease, diabetes mellitus type II, hyperlipidemia, bilateral total knee replacement, ESRD, anemia of CKD, SHPTH, admitted for dysfunctional PD catheter.   [facility]  1. ESRD on HD TTS/PD peritonitis:  Patient had PD catheter placed back in August. Unfortunately it has not been functional and she's had one prior episode of peritonitis. We attempted to obtain fluid from her peritoneal cavity this time however her PD catheter is dysfunctional. Therefore we have decided to remove the PD catheter altogether and maintain the patient on hemodialysis. Patient will be undergoing hemodialysis today.  2.  Anemia of chronic kidney disease. Continue to hold Epogen for now.  3. Secondary hyperparathyroidism. Phosphorus quite high at 7.2. Repeat this today. Continue Sensipar and Renvela.  4.  Hypertension. Blood pressure currently 151/53.  Continue amlodipine as well as metoprolol.   LOS: 0 Aylene Acoff 10/11/20189:56 AM

## 2017-06-29 NOTE — Progress Notes (Signed)
[place] [facility] [place] NAME: Kimberly Shaffer    MR#:  [id number]  DATE OF BIRTH:  May 17, 1951  SUBJECTIVE:   Patient here due to abdominal pain with pain specifically closer to her peritoneal dialysis catheter site. Patient seen on hemodialysis today and tolerating it well. Still having some intermittent pain near the PD catheter site. No nausea, vomiting or fever. Plan for PD catheter removal tomorrow.  REVIEW OF SYSTEMS:    Review of Systems  Constitutional: Negative for chills and fever.  HENT: Negative for congestion and tinnitus.   Eyes: Negative for blurred vision and double vision.  Respiratory: Negative for cough, shortness of breath and wheezing.   Cardiovascular: Negative for chest pain, orthopnea and PND.  Gastrointestinal: Positive for abdominal pain. Negative for diarrhea, nausea and vomiting.  Genitourinary: Negative for dysuria and hematuria.  Neurological: Negative for dizziness, sensory change and focal weakness.  All other systems reviewed and are negative.   Nutrition: Renal/Carb modified.  Tolerating Diet: Yes Tolerating PT: Await Eval.    DRUG ALLERGIES:   Allergies  Allergen Reactions  . Nystatin Hives and Itching  . Sulfa Antibiotics Swelling, Hives and Rash  . Niaspan [Niacin] Other (See Comments) and Hives    VITALS:  Blood pressure 132/86, pulse 66, temperature 98 F (36.7 C), temperature source Oral, resp. rate 11, height 5' 6.5" (1.689 m), weight 103.9 kg (229 lb 0.9 oz), SpO2 98 %.  PHYSICAL EXAMINATION:   Physical Exam  GENERAL:  66 y.o.-year-old patient lying in bed in no acute distress.  EYES: Pupils equal, round, reactive to light and accommodation. No scleral icterus. Extraocular muscles intact.  HEENT: Head atraumatic, normocephalic. Oropharynx and nasopharynx clear.  NECK:  Supple, no jugular venous distention. No thyroid enlargement, no tenderness.  LUNGS: Normal breath sounds bilaterally, no  wheezing, rales, rhonchi. No use of accessory muscles of respiration.  CARDIOVASCULAR: S1, S2 normal. No murmurs, rubs, or gallops.  ABDOMEN: Soft, nontender, nondistended. Bowel sounds present. No organomegaly or mass. + PD cath in place with no acute drainage noted near site.  EXTREMITIES: No cyanosis, clubbing or edema b/l.    NEUROLOGIC: Cranial nerves II through XII are intact. No focal Motor or sensory deficits b/l.   PSYCHIATRIC: The patient is alert and oriented x 3.  SKIN: No obvious rash, lesion, or ulcer.   Left upper ext. AV fistula with good bruit/thrill.   LABORATORY PANEL:   CBC  Recent Labs Lab 06/29/17 1005  WBC 4.4  HGB 10.2*  HCT 30.9*  PLT 175   ------------------------------------------------------------------------------------------------------------------  Chemistries   Recent Labs Lab 06/27/17 1803 06/29/17 1005  NA 139 135  K 4.5 3.4*  CL 103 97*  CO2 20* 23  GLUCOSE 133* 116*  BUN 88* 41*  CREATININE 9.99* 5.67*  CALCIUM 8.0* 8.1*  AST 26  --   ALT 20  --   ALKPHOS 70  --   BILITOT 0.6  --    ------------------------------------------------------------------------------------------------------------------  Cardiac Enzymes No results for input(s): TROPONINI in the last 168 hours. ------------------------------------------------------------------------------------------------------------------  RADIOLOGY:  No results found.   ASSESSMENT AND PLAN:   66 year old female with past medical history of end-stage renal disease on peritoneal dialysis, diabetes, COPD, hypertension, hyperlipidemia, history of previous MI, history of coronary artery disease who presented to the hospital due to abdominal pain.  1. Abdominal pain-etiology unclear but suspected to be secondary to peritonitis secondary to PD catheter malfunction. -Clinically patient is afebrile and hemodynamically stable with  a normal white cell count. -Continue IV vancomycin,  ceftazidime to cover for peritonitis. Await PD catheter removal tomorrow. Appreciate nephrology input.  2. End-stage renal disease-PD catheter to be removed tomorrow due to above complaint. Continue hemodialysis with a left upper extremity AV fistula. Nephrology has been consulted, continue dialysis on Tuesday Thursday Saturday. - no acute issues.   3. Essential hypertension-continue Norvasc, metoprolol.  4. Secondary hyperparathyroidism-continue Sensipar, Renvela..  5. COPD-no acute exacerbation-continue Dulera, Spiriva.  6. GERD-continue Protonix.  7. Hyperlipidemia-continue Crestor.  8. Neuropathy-continue gabapentin.  Possible d/c home tomorrow after PD cath removal.   All the records are reviewed and case discussed with Care Management/Social Worker. Management plans discussed with the patient, family and they are in agreement.  CODE STATUS: Full code  DVT Prophylaxis: Hep. SQ  TOTAL TIME TAKING CARE OF THIS PATIENT: 25 minutes.   POSSIBLE D/C IN 1-2 DAYS, DEPENDING ON CLINICAL CONDITION.   Henreitta Leber M.D on 06/29/2017 at 2:13 PM  Between 7am to 6pm - Pager - [phone]  After 6pm go to [email or web address] - [employer] [facility]  Office  [phone]  CC: Primary care physician; [facility]

## 2017-06-29 NOTE — Progress Notes (Signed)
HD completed without issue. UF 1.5L. Patient tolerated well.

## 2017-06-29 NOTE — Progress Notes (Signed)
Post hd assessment unchanged  

## 2017-06-29 NOTE — Consult Note (Signed)
[facility] Vascular Consult Note  MRN : [id number]  Kimberly Shaffer is a 66 y.o. (1950-10-29) female who presents with chief complaint of  Chief Complaint  Patient presents with  . pain at catheter site  .  History of Present Illness: I am asked to see the patient by Dr Holley Raring.  The patient is a 66 yo woman who presented several days ago with abdominal pain.  She was found to have peritonitis related to her PD catheter.  She has not been able to do peritoneal dialysis as her catheter is not working and she has been doing hemodyalis successfully.  Today she continues to have abdominal pain. No fever or chills.   Current Facility-Administered Medications  Medication Dose Route Frequency Provider Last Rate Last Dose  . acetaminophen (TYLENOL) tablet 650 mg  650 mg Oral Q6H PRN Harrie Foreman, MD       Or  . acetaminophen (TYLENOL) suppository 650 mg  650 mg Rectal Q6H PRN Harrie Foreman, MD      . albuterol (PROVENTIL) (2.5 MG/3ML) 0.083% nebulizer solution 2.5 mg  2.5 mg Nebulization Q4H PRN Harrie Foreman, MD      . amLODipine (NORVASC) tablet 10 mg  10 mg Oral Daily Harrie Foreman, MD   Stopped at 06/29/17 1000  . aspirin EC tablet 81 mg  81 mg Oral Daily Harrie Foreman, MD   81 mg at 06/29/17 0934  . calcium carbonate (TUMS - dosed in mg elemental calcium) chewable tablet 600 mg of elemental calcium  600 mg of elemental calcium Oral Q breakfast Harrie Foreman, MD   600 mg of elemental calcium at 06/29/17 0935  . [START ON 06/30/2017] ceFAZolin (ANCEF) IVPB 1 g/50 mL premix  1 g Intravenous On Call Parvin Stetzer, Dolores Lory, MD      . cefTAZidime (FORTAZ) 1 g in dextrose 5 % 50 mL IVPB  1 g Intravenous Q24H Hallaji, Sheema M, RPH 100 mL/hr at 06/29/17 2059 1 g at 06/29/17 2059  . cinacalcet (SENSIPAR) tablet 30 mg  30 mg Oral Daily Harrie Foreman, MD   30 mg at 06/29/17 0935  . docusate sodium (COLACE) capsule 100 mg  100 mg Oral BID Harrie Foreman, MD   100 mg at 06/29/17 2058  . folic acid (FOLVITE) tablet 1 mg  1 mg Oral Daily Harrie Foreman, MD   1 mg at 06/29/17 0934  . gabapentin (NEURONTIN) capsule 100 mg  100 mg Oral q morning - 10a Harrie Foreman, MD   100 mg at 06/29/17 0934  . heparin injection 5,000 Units  5,000 Units Subcutaneous Q8H Harrie Foreman, MD   5,000 Units at 06/29/17 2058  . hydrocortisone-pramoxine (ANALPRAM-HC) 2.5-1 % rectal cream 1 application  1 application Rectal BID PRN Harrie Foreman, MD      . loratadine (CLARITIN) tablet 10 mg  10 mg Oral Daily Harrie Foreman, MD   10 mg at 06/29/17 0934  . lubiprostone (AMITIZA) capsule 24 mcg  24 mcg Oral Daily PRN Harrie Foreman, MD      . meclizine (ANTIVERT) tablet 12.5 mg  12.5 mg Oral TID PRN Harrie Foreman, MD      . methocarbamol (ROBAXIN) tablet 500-750 mg  500-750 mg Oral BID PRN Harrie Foreman, MD      . metoprolol succinate (TOPROL-XL) 24 hr tablet 100 mg  100 mg Oral Daily Harrie Foreman, MD  Stopped at 06/29/17 1000  . mometasone-formoterol (DULERA) 200-5 MCG/ACT inhaler 2 puff  2 puff Inhalation BID Harrie Foreman, MD   2 puff at 06/29/17 2059  . nitroGLYCERIN (NITROSTAT) SL tablet 0.4 mg  0.4 mg Sublingual Q5 Min x 3 PRN Harrie Foreman, MD      . ondansetron [facility]) tablet 4 mg  4 mg Oral Q6H PRN Harrie Foreman, MD       Or  . ondansetron [facility]) injection 4 mg  4 mg Intravenous Q6H PRN Harrie Foreman, MD      . oxyCODONE (Oxy IR/ROXICODONE) immediate release tablet 10 mg  10 mg Oral QID PRN Henreitta Leber, MD   10 mg at 06/29/17 1823  . pantoprazole (PROTONIX) EC tablet 40 mg  40 mg Oral BID Harrie Foreman, MD   40 mg at 06/29/17 2057  . rosuvastatin (CRESTOR) tablet 20 mg  20 mg Oral Daily Harrie Foreman, MD   20 mg at 06/29/17 0935  . sevelamer carbonate (RENVELA) powder PACK 2.4 g  2.4 g Oral BID WC Harrie Foreman, MD   2.4 g at 06/28/17 1804  . tiotropium (SPIRIVA) inhalation  capsule 18 mcg  1 capsule Inhalation Daily Harrie Foreman, MD   18 mcg at 06/28/17 [phone]  . vancomycin (VANCOCIN) IVPB 1000 mg/200 mL premix  1,000 mg Intravenous Q T,Th,Sa-HD Pernell Dupre, RPH   Stopped at 06/29/17 1321  . venlafaxine XR (EFFEXOR-XR) 24 hr capsule 75 mg  75 mg Oral Daily Harrie Foreman, MD   75 mg at 06/29/17 0935    Past Medical History:  Diagnosis Date  . Asthma   . CAD (coronary artery disease)   . Chronic lower back pain   . COPD (chronic obstructive pulmonary disease) ([place])   . Depression   . Diabetes mellitus without complication (HCC)    NIDD  . H/O angina pectoris   . H/O blood clots   . Hypercholesteremia   . Hypertension   . Left rotator cuff tear   . Myocardial infarction ([place])   . Obesity   . Renal insufficiency   . Stroke ([place])   . Vertigo, aural     Past Surgical History:  Procedure Laterality Date  . A/V FISTULAGRAM Left 03/14/2017   Procedure: A/V Fistulagram;  Surgeon: Katha Cabal, MD;  Location: [place] [facility];  Service: Cardiovascular;  Laterality: Left;  . AV FISTULA PLACEMENT Left 11-04-2015  . AV FISTULA PLACEMENT Left 11/04/2015   Procedure: ARTERIOVENOUS (AV) FISTULA CREATION  ( BRACHIAL CEPHALIC );  Surgeon: Katha Cabal, MD;  Location: [facility] ORS;  Service: Vascular;  Laterality: Left;  . CARDIAC CATHETERIZATION    . CARDIAC CATHETERIZATION Left 07/19/2016   Procedure: Left Heart Cath and Coronary Angiography;  Surgeon: Corey Skains, MD;  Location: [place] [facility];  Service: Cardiovascular;  Laterality: Left;  . CORONARY ARTERY BYPASS GRAFT  2012  . CORONARY STENT PLACEMENT    . JOINT REPLACEMENT    . KNEE SURGERY Bilateral   . PERIPHERAL VASCULAR CATHETERIZATION N/A 11/13/2015   Procedure: Dialysis/Perma Catheter Insertion;  Surgeon: Katha Cabal, MD;  Location: [place] [facility];  Service: Cardiovascular;  Laterality: N/A;  . PERIPHERAL VASCULAR CATHETERIZATION N/A 12/04/2015   Procedure:  Dialysis/Perma Catheter Insertion;  Surgeon: Katha Cabal, MD;  Location: [place] [facility];  Service: Cardiovascular;  Laterality: N/A;  . PERIPHERAL VASCULAR CATHETERIZATION Left 12/31/2015   Procedure: A/V Shuntogram/Fistulagram;  Surgeon: Algernon Huxley, MD;  Location: [place] [facility];  Service: Cardiovascular;  Laterality: Left;  . PERIPHERAL VASCULAR CATHETERIZATION N/A 12/31/2015   Procedure: A/V Shunt Intervention;  Surgeon: Algernon Huxley, MD;  Location: [place] [facility];  Service: Cardiovascular;  Laterality: N/A;  . PERIPHERAL VASCULAR CATHETERIZATION Left 02/25/2016   Procedure: A/V Shuntogram/Fistulagram;  Surgeon: Algernon Huxley, MD;  Location: [place] [facility];  Service: Cardiovascular;  Laterality: Left;  . PERIPHERAL VASCULAR CATHETERIZATION N/A 03/18/2016   Procedure: Dialysis/Perma Catheter Removal;  Surgeon: Katha Cabal, MD;  Location: [place] [facility];  Service: Cardiovascular;  Laterality: N/A;  . REPLACEMENT TOTAL KNEE BILATERAL Bilateral     Social History Social History  Substance Use Topics  . Smoking status: Current Every Day Smoker    Packs/day: 0.25    Types: Cigarettes  . Smokeless tobacco: Never Used  . Alcohol use No    Family History Family History  Problem Relation Age of Onset  . Cancer Mother   . Cancer Father   . Diabetes Brother   . Heart disease Brother   No family history of bleeding/clotting disorders, porphyria or autoimmune disease   Allergies  Allergen Reactions  . Nystatin Hives and Itching  . Sulfa Antibiotics Swelling, Hives and Rash  . Niaspan [Niacin] Other (See Comments) and Hives     REVIEW OF SYSTEMS (Negative unless checked)  Constitutional: [] Weight loss  [] Fever  [] Chills Cardiac: [] Chest pain   [] Chest pressure   [] Palpitations   [] Shortness of breath when laying flat   [] Shortness of breath at rest   [] Shortness of breath with exertion. Vascular:  [] Pain in legs with walking   [] Pain in legs at rest    [] Pain in legs when laying flat   [] Claudication   [] Pain in feet when walking  [] Pain in feet at rest  [] Pain in feet when laying flat   [] History of DVT   [] Phlebitis   [] Swelling in legs   [] Varicose veins   [] Non-healing ulcers Pulmonary:   [] Uses home oxygen   [] Productive cough   [] Hemoptysis   [] Wheeze  [] COPD   [] Asthma Neurologic:  [] Dizziness  [] Blackouts   [] Seizures   [] History of stroke   [] History of TIA  [] Aphasia   [] Temporary blindness   [] Dysphagia   [] Weakness or numbness in arms   [] Weakness or numbness in legs Musculoskeletal:  [] Arthritis   [] Joint swelling   [] Joint pain   [] Low back pain Hematologic:  [] Easy bruising  [] Easy bleeding   [] Hypercoagulable state   [] Anemic  [] Hepatitis Gastrointestinal:  [] Blood in stool   [] Vomiting blood  [] Gastroesophageal reflux/heartburn   [] Difficulty swallowing. Genitourinary:  [x] Chronic kidney disease   [] Difficult urination  [] Frequent urination  [] Burning with urination   [] Blood in urine Skin:  [] Rashes   [] Ulcers   [] Wounds Psychological:  [] History of anxiety   []  History of major depression.  Physical Examination  Vitals:   06/29/17 1333 06/29/17 1338 06/29/17 1416 06/29/17 1934  BP: 136/87 132/86 (!) 140/56 (!) 143/73  Pulse: 61 66 62 74  Resp: 17 11 16    Temp: 98 F (36.7 C)  97.8 F (36.6 C) 98 F (36.7 C)  TempSrc: Oral  Oral Oral  SpO2: 99% 98% 94% 97%  Weight: 103.9 kg (229 lb 0.9 oz)     Height:       Body mass index is 36.42 kg/m. Gen:  WD/WN, NAD Head: [place]/AT, No temporalis wasting. Prominent temp pulse not  noted. Ear/Nose/Throat: Hearing grossly intact, nares w/o erythema or drainage, oropharynx w/o Erythema/Exudate Eyes: Sclera non-icteric, conjunctiva clear Neck: Trachea midline.  No JVD.  Pulmonary:  Good air movement, respirations not labored, equal bilaterally.  Cardiac: RRR, normal S1, S2. Vascular:   Left arm AV access good thrill and good bruit Vessel Right Left  Radial Palpable Palpable   Ulnar Palpable Palpable  Brachial Palpable Palpable  Gastrointestinal: soft, + tender but non-distended. No guarding/reflex.  Musculoskeletal: M/S 5/5 throughout.  Extremities without ischemic changes.  No deformity or atrophy. No edema. Neurologic: Sensation grossly intact in extremities.  Symmetrical.  Speech is fluent. Motor exam as listed above. Psychiatric: Judgment intact, Mood & affect appropriate for pt's clinical situation. Dermatologic: No rashes or ulcers noted.  No cellulitis or open wounds. Lymph : No Cervical, Axillary, or Inguinal lymphadenopathy.      CBC Lab Results  Component Value Date   WBC 4.4 06/29/2017   HGB 10.2 (L) 06/29/2017   HCT 30.9 (L) 06/29/2017   MCV 89.5 06/29/2017   PLT 175 06/29/2017    BMET    Component Value Date/Time   NA 135 06/29/2017 1005   NA 140 04/09/2014 0404   K 3.4 (L) 06/29/2017 1005   K 4.5 04/09/2014 0404   CL 97 (L) 06/29/2017 1005   CL 113 (H) 04/09/2014 0404   CO2 23 06/29/2017 1005   CO2 20 (L) 04/09/2014 0404   GLUCOSE 116 (H) 06/29/2017 1005   GLUCOSE 86 04/09/2014 0404   BUN 41 (H) 06/29/2017 1005   BUN 36 (H) 04/09/2014 0404   CREATININE 5.67 (H) 06/29/2017 1005   CREATININE 3.34 (H) 04/09/2014 0404   CALCIUM 8.1 (L) 06/29/2017 1005   CALCIUM 8.9 04/09/2014 0404   GFRNONAA 7 (L) 06/29/2017 1005   GFRNONAA 14 (L) 04/09/2014 0404   GFRAA 8 (L) 06/29/2017 1005   GFRAA 16 (L) 04/09/2014 0404   Estimated Creatinine Clearance: 12 mL/min (A) (by C-G formula based on SCr of 5.67 mg/dL (H)).  COAG Lab Results  Component Value Date   INR 1.13 10/29/2015   INR 0.9 06/10/2013    Radiology Dg Abd 1 View  Result Date: 06/09/2017 CLINICAL DATA:  Verify peritoneal dialysis catheter placement. EXAM: ABDOMEN - 1 VIEW COMPARISON:  09/13/2010 and CT 12/17/2015 FINDINGS: Patient's peritoneal dialysis catheter is seen entering the right lower quadrant pain and coiled with tip over the right pelvis. Bowel gas pattern is  nonobstructive. Suggestion of hepatomegaly unchanged. There are several pelvic phleboliths. There are degenerative changes of the spine and hips. IMPRESSION: Nonobstructive bowel gas pattern. Peritoneal dialysis catheter with tip over the right pelvis. Electronically Signed   By: Marin Olp M.D.   On: 06/09/2017 15:58      Assessment/Plan 1.Complication of dialysis device:  The patient has persistent peritonitis and has failed PD dislysis.  The PD catheter should be removed and surgery will be be scheduled for tomorrow.  Risk and benefits were reviewed the patient.  Indications for the procedure were reviewed.  All questions were answered, the patient agrees to proceed.   2. Copd: Continue pulmonary medications and aerosols as already ordered, these medications have been reviewed and there are no changes at this time.  3. CAD:  Continue cardiac and antihypertensive medications as already ordered and reviewed, no changes at this time.  Continue statin as ordered and reviewed, no changes at this time  Nitrates PRN for chest pain  4.  Hypertension:  Continue antihypertensive medications as already ordered,  these medications have been reviewed and there are no changes at this time.  5.  Hyperlipidemia:Continue statin as ordered and reviewed, no changes at this time     Hortencia Pilar, MD  06/29/2017 9:11 PM    This note was created with Dragon medical transcription system.  Any error is purely unintentional

## 2017-06-30 ENCOUNTER — Encounter
Admission: EM | Disposition: A | Discharge status: Home / Self Care | Payer: Self-pay | Source: Home / Self Care | Attending: Specialist

## 2017-06-30 ENCOUNTER — Encounter: Payer: Self-pay | Admitting: Anesthesiology

## 2017-06-30 ENCOUNTER — Observation Stay: Payer: Medicare HMO | Admitting: Certified Registered"

## 2017-06-30 DIAGNOSIS — Z951 Presence of aortocoronary bypass graft: Secondary | ICD-10-CM | POA: Diagnosis not present

## 2017-06-30 DIAGNOSIS — T829XXA Unspecified complication of cardiac and vascular prosthetic device, implant and graft, initial encounter: Secondary | ICD-10-CM | POA: Diagnosis present

## 2017-06-30 DIAGNOSIS — Z96653 Presence of artificial knee joint, bilateral: Secondary | ICD-10-CM | POA: Diagnosis present

## 2017-06-30 DIAGNOSIS — Z8249 Family history of ischemic heart disease and other diseases of the circulatory system: Secondary | ICD-10-CM | POA: Diagnosis not present

## 2017-06-30 DIAGNOSIS — K659 Peritonitis, unspecified: Secondary | ICD-10-CM | POA: Diagnosis present

## 2017-06-30 DIAGNOSIS — I252 Old myocardial infarction: Secondary | ICD-10-CM | POA: Diagnosis not present

## 2017-06-30 DIAGNOSIS — G8929 Other chronic pain: Secondary | ICD-10-CM | POA: Diagnosis present

## 2017-06-30 DIAGNOSIS — Z992 Dependence on renal dialysis: Secondary | ICD-10-CM | POA: Diagnosis not present

## 2017-06-30 DIAGNOSIS — N2581 Secondary hyperparathyroidism of renal origin: Secondary | ICD-10-CM | POA: Diagnosis present

## 2017-06-30 DIAGNOSIS — Z809 Family history of malignant neoplasm, unspecified: Secondary | ICD-10-CM | POA: Diagnosis not present

## 2017-06-30 DIAGNOSIS — Z9889 Other specified postprocedural states: Secondary | ICD-10-CM | POA: Diagnosis not present

## 2017-06-30 DIAGNOSIS — Z8673 Personal history of transient ischemic attack (TIA), and cerebral infarction without residual deficits: Secondary | ICD-10-CM | POA: Diagnosis not present

## 2017-06-30 DIAGNOSIS — I2510 Atherosclerotic heart disease of native coronary artery without angina pectoris: Secondary | ICD-10-CM | POA: Diagnosis present

## 2017-06-30 DIAGNOSIS — Z888 Allergy status to other drugs, medicaments and biological substances status: Secondary | ICD-10-CM | POA: Diagnosis not present

## 2017-06-30 DIAGNOSIS — Z882 Allergy status to sulfonamides status: Secondary | ICD-10-CM | POA: Diagnosis not present

## 2017-06-30 DIAGNOSIS — I120 Hypertensive chronic kidney disease with stage 5 chronic kidney disease or end stage renal disease: Secondary | ICD-10-CM | POA: Diagnosis present

## 2017-06-30 DIAGNOSIS — F17210 Nicotine dependence, cigarettes, uncomplicated: Secondary | ICD-10-CM | POA: Diagnosis present

## 2017-06-30 DIAGNOSIS — E1122 Type 2 diabetes mellitus with diabetic chronic kidney disease: Secondary | ICD-10-CM | POA: Diagnosis present

## 2017-06-30 DIAGNOSIS — E669 Obesity, unspecified: Secondary | ICD-10-CM | POA: Diagnosis present

## 2017-06-30 DIAGNOSIS — M5416 Radiculopathy, lumbar region: Secondary | ICD-10-CM | POA: Diagnosis present

## 2017-06-30 DIAGNOSIS — N186 End stage renal disease: Secondary | ICD-10-CM | POA: Diagnosis not present

## 2017-06-30 DIAGNOSIS — J449 Chronic obstructive pulmonary disease, unspecified: Secondary | ICD-10-CM | POA: Diagnosis present

## 2017-06-30 DIAGNOSIS — Z833 Family history of diabetes mellitus: Secondary | ICD-10-CM | POA: Diagnosis not present

## 2017-06-30 DIAGNOSIS — E785 Hyperlipidemia, unspecified: Secondary | ICD-10-CM | POA: Diagnosis present

## 2017-06-30 DIAGNOSIS — M545 Low back pain: Secondary | ICD-10-CM | POA: Diagnosis present

## 2017-06-30 HISTORY — PX: REMOVAL OF A DIALYSIS CATHETER: SHX6053

## 2017-06-30 LAB — GLUCOSE, CAPILLARY
Glucose-Capillary: 95 mg/dL (ref 65–99)
Glucose-Capillary: 110 mg/dL — ABNORMAL HIGH (ref 65–99)

## 2017-06-30 SURGERY — REMOVAL, DIALYSIS CATHETER
Anesthesia: General | Wound class: Clean

## 2017-06-30 MED ORDER — BUPIVACAINE HCL (PF) 0.5 % IJ SOLN
INTRAMUSCULAR | Status: AC
  Filled 2017-06-30: qty 30

## 2017-06-30 MED ORDER — NICOTINE 21 MG/24HR TD PT24
21.0000 mg | MEDICATED_PATCH | Freq: Every day | TRANSDERMAL | Status: DC
  Administered 2017-06-30 – 2017-07-01 (×2): 21 mg via TRANSDERMAL
  Filled 2017-06-30 (×2): qty 1

## 2017-06-30 MED ORDER — FENTANYL CITRATE (PF) 100 MCG/2ML IJ SOLN
INTRAMUSCULAR | Status: AC
  Filled 2017-06-30: qty 2

## 2017-06-30 MED ORDER — ROCURONIUM BROMIDE 50 MG/5ML IV SOLN
INTRAVENOUS | Status: AC
  Filled 2017-06-30: qty 1

## 2017-06-30 MED ORDER — SODIUM CHLORIDE FLUSH 0.9 % IV SOLN
INTRAVENOUS | Status: AC
  Filled 2017-06-30: qty 10

## 2017-06-30 MED ORDER — BUPIVACAINE-EPINEPHRINE 0.5% -1:200000 IJ SOLN
INTRAMUSCULAR | Status: DC | PRN
  Administered 2017-06-30: 20 mL

## 2017-06-30 MED ORDER — BUPIVACAINE-EPINEPHRINE (PF) 0.5% -1:200000 IJ SOLN
INTRAMUSCULAR | Status: AC
  Filled 2017-06-30: qty 30

## 2017-06-30 MED ORDER — PROPOFOL 10 MG/ML IV BOLUS
INTRAVENOUS | Status: DC | PRN
  Administered 2017-06-30: 50 mg via INTRAVENOUS
  Administered 2017-06-30: 120 mg via INTRAVENOUS

## 2017-06-30 MED ORDER — PROPOFOL 10 MG/ML IV BOLUS
INTRAVENOUS | Status: AC
  Filled 2017-06-30: qty 20

## 2017-06-30 MED ORDER — ONDANSETRON HCL 4 MG/2ML IJ SOLN
INTRAMUSCULAR | Status: AC
  Filled 2017-06-30: qty 2

## 2017-06-30 MED ORDER — LIDOCAINE HCL (CARDIAC) 20 MG/ML IV SOLN
INTRAVENOUS | Status: DC | PRN
  Administered 2017-06-30: 40 mg via INTRAVENOUS

## 2017-06-30 MED ORDER — FENTANYL CITRATE (PF) 100 MCG/2ML IJ SOLN: 25.0000 ug | INTRAMUSCULAR | Status: DC | PRN

## 2017-06-30 MED ORDER — EPHEDRINE SULFATE 50 MG/ML IJ SOLN
INTRAMUSCULAR | Status: DC | PRN
  Administered 2017-06-30 (×2): 10 mg via INTRAVENOUS

## 2017-06-30 MED ORDER — ONDANSETRON HCL 4 MG/2ML IJ SOLN: 4.0000 mg | Freq: Once | INTRAMUSCULAR | Status: DC | PRN

## 2017-06-30 MED ORDER — SODIUM CHLORIDE 0.9 % IV SOLN
INTRAVENOUS | Status: DC
  Administered 2017-06-30: 13:00:00 via INTRAVENOUS

## 2017-06-30 MED ORDER — ONDANSETRON HCL 4 MG/2ML IJ SOLN
INTRAMUSCULAR | Status: DC | PRN
Start: 2017-06-30 — End: 2017-06-30
  Administered 2017-06-30: 4 mg via INTRAVENOUS

## 2017-06-30 MED ORDER — FENTANYL CITRATE (PF) 100 MCG/2ML IJ SOLN
INTRAMUSCULAR | Status: DC | PRN
  Administered 2017-06-30 (×2): 50 ug via INTRAVENOUS

## 2017-06-30 MED ORDER — LIDOCAINE HCL (PF) 2 % IJ SOLN
INTRAMUSCULAR | Status: AC
  Filled 2017-06-30: qty 2

## 2017-06-30 MED ORDER — BACITRACIN ZINC 500 UNIT/GM EX OINT
TOPICAL_OINTMENT | CUTANEOUS | Status: AC
  Filled 2017-06-30: qty 28.35

## 2017-06-30 SURGICAL SUPPLY — 27 items
BLADE SURG 15 STRL LF DISP TIS (BLADE) ×1 IMPLANT
BLADE SURG 15 STRL SS (BLADE) ×2
CANISTER SUCT 1200ML W/VALVE (MISCELLANEOUS) ×3 IMPLANT
CHLORAPREP W/TINT 26ML (MISCELLANEOUS) ×3 IMPLANT
DERMABOND ADVANCED (GAUZE/BANDAGES/DRESSINGS) ×2
DERMABOND ADVANCED .7 DNX12 (GAUZE/BANDAGES/DRESSINGS) ×1 IMPLANT
DRAPE LAPAROTOMY 100X77 ABD (DRAPES) ×3 IMPLANT
ELECT REM PT RETURN 9FT ADLT (ELECTROSURGICAL) ×3
ELECTRODE REM PT RTRN 9FT ADLT (ELECTROSURGICAL) ×1 IMPLANT
GAUZE SPONGE NON-WVN 2X2 STRL (MISCELLANEOUS) ×1 IMPLANT
GLOVE SURG SYN 8.0 (GLOVE) ×3 IMPLANT
GOWN STRL REUS W/ TWL LRG LVL3 (GOWN DISPOSABLE) ×1 IMPLANT
GOWN STRL REUS W/ TWL XL LVL3 (GOWN DISPOSABLE) ×1 IMPLANT
GOWN STRL REUS W/TWL LRG LVL3 (GOWN DISPOSABLE) ×2
GOWN STRL REUS W/TWL XL LVL3 (GOWN DISPOSABLE) ×2
KIT RM TURNOVER STRD PROC AR (KITS) ×3 IMPLANT
LABEL OR SOLS (LABEL) ×3 IMPLANT
NEEDLE HYPO 25X1 1.5 SAFETY (NEEDLE) ×3 IMPLANT
NS IRRIG 500ML POUR BTL (IV SOLUTION) ×3 IMPLANT
PACK BASIN MINOR ARMC (MISCELLANEOUS) ×3 IMPLANT
SPONGE VERSALON 2X2 STRL (MISCELLANEOUS) ×2
SUT MNCRL+ 5-0 UNDYED PC-3 (SUTURE) ×1 IMPLANT
SUT MONOCRYL 5-0 (SUTURE) ×2
SUT VIC AB 0 CT2 27 (SUTURE) ×3 IMPLANT
SUT VIC AB 3-0 SH 27 (SUTURE) ×2
SUT VIC AB 3-0 SH 27X BRD (SUTURE) ×1 IMPLANT
SYRINGE 10CC LL (SYRINGE) ×3 IMPLANT

## 2017-06-30 NOTE — Progress Notes (Signed)
[facility] [place] NAME: Kimberly Shaffer    MR#:  [id number]  DATE OF BIRTH:  1951/02/19  SUBJECTIVE:   Patient here due to abdominal pain with pain specifically closer to her peritoneal dialysis catheter site. Still having intermittent abdominal pain and going for PD cath removal today.  No other acute complaints or events overnight.   REVIEW OF SYSTEMS:    Review of Systems  Constitutional: Negative for chills and fever.  HENT: Negative for congestion and tinnitus.   Eyes: Negative for blurred vision and double vision.  Respiratory: Negative for cough, shortness of breath and wheezing.   Cardiovascular: Negative for chest pain, orthopnea and PND.  Gastrointestinal: Positive for abdominal pain. Negative for diarrhea, nausea and vomiting.  Genitourinary: Negative for dysuria and hematuria.  Neurological: Negative for dizziness, sensory change and focal weakness.  All other systems reviewed and are negative.   Nutrition: Renal/Carb modified.  Tolerating Diet: Yes Tolerating PT: Await Eval.    DRUG ALLERGIES:   Allergies  Allergen Reactions  . Nystatin Hives and Itching  . Sulfa Antibiotics Swelling, Hives and Rash  . Niaspan [Niacin] Other (See Comments) and Hives    VITALS:  Blood pressure 126/60, pulse 63, temperature (!) 97.4 F (36.3 C), resp. rate 16, height 5' 6.5" (1.689 m), weight 103.9 kg (229 lb), SpO2 95 %.  PHYSICAL EXAMINATION:   Physical Exam  GENERAL:  66 y.o.-year-old patient lying in bed in no acute distress.  EYES: Pupils equal, round, reactive to light and accommodation. No scleral icterus. Extraocular muscles intact.  HEENT: Head atraumatic, normocephalic. Oropharynx and nasopharynx clear.  NECK:  Supple, no jugular venous distention. No thyroid enlargement, no tenderness.  LUNGS: Normal breath sounds bilaterally, no wheezing, rales, rhonchi. No use of accessory muscles of respiration.  CARDIOVASCULAR: S1,  S2 normal. No murmurs, rubs, or gallops.  ABDOMEN: Soft, nontender, nondistended. Bowel sounds present. No organomegaly or mass. + PD cath in place with no acute drainage noted near site.  EXTREMITIES: No cyanosis, clubbing or edema b/l.    NEUROLOGIC: Cranial nerves II through XII are intact. No focal Motor or sensory deficits b/l.   PSYCHIATRIC: The patient is alert and oriented x 3.  SKIN: No obvious rash, lesion, or ulcer.   Left upper ext. AV fistula with good bruit/thrill.   LABORATORY PANEL:   CBC  Recent Labs Lab 06/29/17 1005  WBC 4.4  HGB 10.2*  HCT 30.9*  PLT 175   ------------------------------------------------------------------------------------------------------------------  Chemistries   Recent Labs Lab 06/27/17 1803 06/29/17 1005  NA 139 135  K 4.5 3.4*  CL 103 97*  CO2 20* 23  GLUCOSE 133* 116*  BUN 88* 41*  CREATININE 9.99* 5.67*  CALCIUM 8.0* 8.1*  AST 26  --   ALT 20  --   ALKPHOS 70  --   BILITOT 0.6  --    ------------------------------------------------------------------------------------------------------------------  Cardiac Enzymes No results for input(s): TROPONINI in the last 168 hours. ------------------------------------------------------------------------------------------------------------------  RADIOLOGY:  No results found.   ASSESSMENT AND PLAN:   66 year old female with past medical history of end-stage renal disease on peritoneal dialysis, diabetes, COPD, hypertension, hyperlipidemia, history of previous MI, history of coronary artery disease who presented to the hospital due to abdominal pain.  1. Abdominal pain-etiology unclear but suspected to be secondary to peritonitis secondary to PD catheter malfunction. -Clinically patient is afebrile and hemodynamically stable with a normal white cell count. -Continue IV vancomycin, ceftazidime to cover for peritonitis.  Plan for PD cath removal today. Appreciate nephrology  input.  2. End-stage renal disease-PD catheter to be removed today due to above complaint. Continue hemodialysis with a left upper extremity AV fistula. Nephrology has been consulted, continue dialysis on Tuesday Thursday Saturday. - no acute issues.   3. Essential hypertension-continue Norvasc, metoprolol.  4. Secondary hyperparathyroidism-continue Sensipar, Renvela..  5. COPD-no acute exacerbation-continue Dulera, Spiriva.  6. GERD-continue Protonix.  7. Hyperlipidemia-continue Crestor.  8. Neuropathy-continue gabapentin.  9. Depression - cont. Effexor.  Possible d/c home tomorrow if doing well. Neprhology to arrange abx. At dialysis for perotinitis if needed.    All the records are reviewed and case discussed with Care Management/Social Worker. Management plans discussed with the patient, family and they are in agreement.  CODE STATUS: Full code  DVT Prophylaxis: Hep. SQ  TOTAL TIME TAKING CARE OF THIS PATIENT: 25 minutes.   POSSIBLE D/C IN 1-2 DAYS, DEPENDING ON CLINICAL CONDITION.   Henreitta Leber M.D on 06/30/2017 at 3:39 PM  Between 7am to 6pm - Pager - [phone]  After 6pm go to [email or web address] - Technical brewer [facility]  Office  [phone]  CC: Primary care physician; [facility]

## 2017-06-30 NOTE — Anesthesia Preprocedure Evaluation (Addendum)
Anesthesia Evaluation  Patient identified by MRN, date of birth, ID band Patient awake    Reviewed: Allergy & Precautions, NPO status , Patient's Chart, lab work & pertinent test results  History of Anesthesia Complications Negative for: history of anesthetic complications  Airway Mallampati: II  TM Distance: >3 FB     Dental  (+) Partial Lower, Edentulous Upper, Poor Dentition, Dental Advidsory Given   Pulmonary neg shortness of breath, asthma , sleep apnea and Continuous Positive Airway Pressure Ventilation , COPD,  COPD inhaler, neg recent URI, Current Smoker,           Cardiovascular hypertension, + angina (stable) + CAD, + Past MI, + Cardiac Stents, + CABG and + Peripheral Vascular Disease  (-) dysrhythmias (-) Valvular Problems/Murmurs     Neuro/Psych neg Seizures PSYCHIATRIC DISORDERS (Depression) Depression Lumbar radiculopathy  Neuromuscular disease CVA    GI/Hepatic negative GI ROS, Neg liver ROS,   Endo/Other  diabetes, Type 2  Renal/GU ESRF and DialysisRenal disease     Musculoskeletal  (+) Arthritis , Osteoarthritis,    Abdominal Normal abdominal exam  (+)   Peds  Hematology  (+) anemia ,   Anesthesia Other Findings Past Medical History: No date: Asthma No date: CAD (coronary artery disease) No date: Chronic lower back pain No date: COPD (chronic obstructive pulmonary disease) (HCC) No date: Depression No date: Diabetes mellitus without complication (HCC)     Comment:  NIDD No date: H/O angina pectoris No date: H/O blood clots No date: Hypercholesteremia No date: Hypertension No date: Left rotator cuff tear No date: Myocardial infarction (HCC) No date: Obesity No date: Renal insufficiency No date: Stroke (HCC) No date: Vertigo, aural   Reproductive/Obstetrics negative OB ROS                            Anesthesia Physical  Anesthesia Plan  ASA: III  Anesthesia  Plan: General   Post-op Pain Management:    Induction: Intravenous  PONV Risk Score and Plan: 1 and Ondansetron and Dexamethasone  Airway Management Planned: Oral ETT  Additional Equipment:   Intra-op Plan:   Post-operative Plan: Extubation in OR  Informed Consent: I have reviewed the patients History and Physical, chart, labs and discussed the procedure including the risks, benefits and alternatives for the proposed anesthesia with the patient or authorized representative who has indicated his/her understanding and acceptance.   Dental advisory given  Plan Discussed with: CRNA and Surgeon  Anesthesia Plan Comments:        Anesthesia Quick Evaluation

## 2017-06-30 NOTE — Anesthesia Procedure Notes (Signed)
Procedure Name: Intubation Performed by: Lance Muss Pre-anesthesia Checklist: Patient identified, Patient being monitored, Timeout performed, Emergency Drugs available and Suction available Patient Re-evaluated:Patient Re-evaluated prior to induction Oxygen Delivery Method: Circle system utilized Preoxygenation: Pre-oxygenation with 100% oxygen Induction Type: IV induction Ventilation: Mask ventilation without difficulty and Oral airway inserted - appropriate to patient size Laryngoscope Size: Mac and 3 Grade View: Grade I Tube type: Oral Tube size: 7.0 mm Number of attempts: 1 Airway Equipment and Method: Stylet Placement Confirmation: ETT inserted through vocal cords under direct vision,  positive ETCO2 and breath sounds checked- equal and bilateral Secured at: 22 cm Tube secured with: Tape Dental Injury: Teeth and Oropharynx as per pre-operative assessment

## 2017-06-30 NOTE — H&P (Signed)
[facility] History & Physical Update  The patient was interviewed and re-examined.  The patient's previous History and Physical has been reviewed and is unchanged.  There is no change in the plan of care. We plan to proceed with the scheduled procedure.  Hortencia Pilar, MD  06/30/2017, 1:00 PM

## 2017-06-30 NOTE — Op Note (Signed)
  OPERATIVE NOTE   PROCEDURE: 1. Removal of a Peritoneal Dialysis Catheter  PRE-OPERATIVE DIAGNOSIS: Complication of dialysis catheter with poor dialysis by peritoneal method,  End Stage Renal Disease  POST-OPERATIVE DIAGNOSIS: Same  SURGEON: Hortencia Pilar  ANESTHESIA: General with Local anesthetic    ESTIMATED BLOOD LOSS: Minimal   FINDING(S): 1. Catheter intact   SPECIMEN(S):  Catheter  INDICATIONS:   Kimberly Shaffer is a 66 y.o. female who presents with multiple episodes of peritonitis and nonfunction of her PD catheter.  DESCRIPTION: After obtaining full informed written consent, the patient was positioned supine. The peritoneal dialysis catheter and surrounding area is prepped and draped in a sterile fashion. The cuff was localized.  After appropriate timeout is called, local anesthetic with epinephrine is infiltrated into the surrounding tissues around the cuff in the right lower quadrant. Previous incision is open with an 15 blade scalpel and the dissection was carried down to expose the cuff of the tunneled catheter.  The catheter is then freed from the surrounding attachments and adhesions. Once the catheter has been freed circumferentially a pursestring suture of 0 Vicryl is placed within the anterior rectus sheath. The catheter is then removed from the peritoneal cavity and the pursestring suture secured. The second cuff is then localized and dissected free. The catheter is transected just distal to the cuff and subsequently removed in 2 pieces. The lower quadrant incision is then irrigated and closed in layers with Vicryl.. A 4-0 Monocryl was used close the skin. Dermabond is applied to the incision.   Antibiotic ointment and a sterile dressing is applied to the exit site. Patient tolerated procedure well and there were no complications.  COMPLICATIONS: None  CONDITION: Unchanged  Hortencia Pilar. [facility] Office: [phone]  06/30/2017,1:50  PM

## 2017-06-30 NOTE — Progress Notes (Signed)
[facility]  ROUNDING NOTE   Subjective:  Patient seen at bedside. She is going for PD catheter removal today. In addition she will be due for hemodialysis again tomorrow.  Objective:  Vital signs in last 24 hours:  Temp:  [97.6 F (36.4 C)-98.6 F (37 C)] 97.6 F (36.4 C) (10/12 1236) Pulse Rate:  [53-80] 60 (10/12 1236) Resp:  [11-20] 18 (10/12 1236) BP: (132-174)/(56-91) 148/82 (10/12 1236) SpO2:  [94 %-100 %] 96 % (10/12 1236) Weight:  [103.9 kg (229 lb)-103.9 kg (229 lb 0.9 oz)] 103.9 kg (229 lb) (10/12 1236)  Weight change: 2.424 kg (5 lb 5.5 oz) Filed Weights   06/29/17 0955 06/29/17 1333 06/30/17 1236  Weight: 105.3 kg (232 lb 2.3 oz) 103.9 kg (229 lb 0.9 oz) 103.9 kg (229 lb)    Intake/Output: I/O last 3 completed shifts: In: 290 [P.O.:240; IV Piggyback:50] Out: 1500 [Other:1500]   Intake/Output this shift:  No intake/output data recorded.  Physical Exam: General: No acute distress  Head: Normocephalic, atraumatic. Moist oral mucosal membranes  Eyes: Anicteric  Neck: Supple, trachea midline  Lungs:  Clear to auscultation, normal effort  Heart: S1S2 no rubs  Abdomen:  Mild diffuse tenderness, BS present, PD catheter in place  Extremities: No peripheral edema.  Neurologic: Awake, alert, following commands  Skin: No lesions  Access: PD catheter in place, LUE AVF    Basic Metabolic Panel:  Recent Labs Lab 06/27/17 1803 06/28/17 1400 06/29/17 1005  NA 139  --  135  K 4.5  --  3.4*  CL 103  --  97*  CO2 20*  --  23  GLUCOSE 133*  --  116*  BUN 88*  --  41*  CREATININE 9.99*  --  5.67*  CALCIUM 8.0*  --  8.1*  PHOS  --  7.2* 5.0*    Liver Function Tests:  Recent Labs Lab 06/27/17 1803 06/29/17 1005  AST 26  --   ALT 20  --   ALKPHOS 70  --   BILITOT 0.6  --   PROT 8.1  --   ALBUMIN 3.5 3.4*   No results for input(s): LIPASE, AMYLASE in the last 168 hours. No results for input(s): AMMONIA in the last 168  hours.  CBC:  Recent Labs Lab 06/27/17 1803 06/29/17 1005  WBC 5.1 4.4  HGB 10.8* 10.2*  HCT 33.2* 30.9*  MCV 91.6 89.5  PLT 186 175    Cardiac Enzymes: No results for input(s): CKTOTAL, CKMB, CKMBINDEX, TROPONINI in the last 168 hours.  BNP: Invalid input(s): POCBNP  CBG:  Recent Labs Lab 06/29/17 2146 06/30/17 1242  GLUCAP 157* 95    Microbiology: Results for orders placed or performed during the hospital encounter of 06/27/17  Blood culture (routine x 2)     Status: None (Preliminary result)   Collection Time: 06/28/17  1:42 AM  Result Value Ref Range Status   Specimen Description BLOOD RIGHT ANTECUBITAL  Final   Special Requests   Final    BOTTLES DRAWN AEROBIC AND ANAEROBIC Blood Culture adequate volume   Culture NO GROWTH 2 DAYS  Final   Report Status PENDING  Incomplete  Blood culture (routine x 2)     Status: None (Preliminary result)   Collection Time: 06/28/17  1:42 AM  Result Value Ref Range Status   Specimen Description BLOOD BLOOD RIGHT HAND  Final   Special Requests   Final    BOTTLES DRAWN AEROBIC AND ANAEROBIC Blood Culture results may not  be optimal due to an inadequate volume of blood received in culture bottles   Culture NO GROWTH 2 DAYS  Final   Report Status PENDING  Incomplete    Coagulation Studies: No results for input(s): LABPROT, INR in the last 72 hours.  Urinalysis: No results for input(s): COLORURINE, LABSPEC, PHURINE, GLUCOSEU, HGBUR, BILIRUBINUR, KETONESUR, PROTEINUR, UROBILINOGEN, NITRITE, LEUKOCYTESUR in the last 72 hours.  Invalid input(s): APPERANCEUR    Imaging: No results found.   Medications:   . sodium chloride 50 mL/hr at 06/30/17 1250  . [MAR Hold]  ceFAZolin (ANCEF) IV    . [MAR Hold] cefTAZidime (FORTAZ)  IV Stopped (06/29/17 2129)  . [MAR Hold] vancomycin Stopped (06/29/17 1321)   . [MAR Hold] amLODipine  10 mg Oral Daily  . [MAR Hold] aspirin EC  81 mg Oral Daily  . [MAR Hold] calcium carbonate  600  mg of elemental calcium Oral Q breakfast  . [MAR Hold] cinacalcet  30 mg Oral Daily  . [MAR Hold] docusate sodium  100 mg Oral BID  . [MAR Hold] folic acid  1 mg Oral Daily  . [MAR Hold] gabapentin  100 mg Oral q morning - 10a  . [MAR Hold] heparin  5,000 Units Subcutaneous Q8H  . [MAR Hold] loratadine  10 mg Oral Daily  . [MAR Hold] metoprolol succinate  100 mg Oral Daily  . [MAR Hold] mometasone-formoterol  2 puff Inhalation BID  . [MAR Hold] pantoprazole  40 mg Oral BID  . [MAR Hold] rosuvastatin  20 mg Oral Daily  . [MAR Hold] sevelamer carbonate  2.4 g Oral BID WC  . sodium chloride flush      . [MAR Hold] tiotropium  1 capsule Inhalation Daily  . [MAR Hold] venlafaxine XR  75 mg Oral Daily   [MAR Hold] acetaminophen **OR** [MAR Hold] acetaminophen, [MAR Hold] albuterol, [MAR Hold] hydrocortisone-pramoxine, [MAR Hold] lubiprostone, [MAR Hold] meclizine, [MAR Hold] methocarbamol, [MAR Hold] nitroGLYCERIN, [MAR Hold] ondansetron **OR** [MAR Hold] ondansetron (ZOFRAN) IV, [MAR Hold] oxyCODONE  Assessment/ Plan:  66 y.o. female with hypertension, coronary artery disease s/p CABG, peripheral vascular disease, diabetes mellitus type II, hyperlipidemia, bilateral total knee replacement, ESRD, anemia of CKD, SHPTH, admitted for dysfunctional PD catheter.   [facility]  1. ESRD on HD TTS/PD peritonitis:  Patient had PD catheter placed back in August. Unfortunately it has not been functional and she's had one prior episode of peritonitis. We attempted to obtain fluid from her peritoneal cavity this time however her PD catheter is dysfunctional.  -  PD catheter to be removed today. In addition patient to have hemodialysis again tomorrow. We will continue to monitor her condition.  2.  Anemia of chronic kidney disease. emoglobin currently 10.2. Hold off on Epogen for now.  3. Secondary hyperparathyroidism. Repeat serum phosphorus tomorrow.  4.  Hypertension. After surgery te patient  will resur normal antihypertensives including amlodipine and metoprolol.   LOS: 0 Nupur Hohman 10/12/20181:10 PM

## 2017-06-30 NOTE — Anesthesia Postprocedure Evaluation (Signed)
Anesthesia Post Note  Patient: Kimberly Shaffer  Procedure(s) Performed: REMOVAL OF A DIALYSIS CATHETER (N/A )  Patient location during evaluation: PACU Anesthesia Type: General Level of consciousness: awake and alert Pain management: pain level controlled Vital Signs Assessment: post-procedure vital signs reviewed and stable Respiratory status: spontaneous breathing, nonlabored ventilation, respiratory function stable and patient connected to nasal cannula oxygen Cardiovascular status: blood pressure returned to baseline and stable Postop Assessment: no apparent nausea or vomiting Anesthetic complications: no     Last Vitals:  Vitals:   06/30/17 1449 06/30/17 1455  BP: (!) 149/62 126/60  Pulse: 68 63  Resp: 13 16  Temp:    SpO2: 96% 95%    Last Pain:  Vitals:   06/30/17 1236  TempSrc: Tympanic  PainSc:                  Josy Peaden S

## 2017-06-30 NOTE — Transfer of Care (Signed)
Immediate Anesthesia Transfer of Care Note  Patient: Kimberly Shaffer  Procedure(s) Performed: REMOVAL OF A DIALYSIS CATHETER (N/A )  Patient Location: PACU  Anesthesia Type:General  Level of Consciousness: sedated and responds to stimulation  Airway & Oxygen Therapy: Patient Spontanous Breathing and Patient connected to face mask oxygen  Post-op Assessment: Report given to RN and Post -op Vital signs reviewed and stable  Post vital signs: Reviewed and stable  Last Vitals:  Vitals:   06/30/17 1236 06/30/17 1359  BP: (!) 148/82 (!) 180/86  Pulse: 60 80  Resp: 18 19  Temp: 36.4 C   SpO2: 96% 96%    Last Pain:  Vitals:   06/30/17 1236  TempSrc: Tympanic  PainSc:       Patients Stated Pain Goal: 7 (03/79/44 4619)  Complications: No apparent anesthesia complications

## 2017-06-30 NOTE — Anesthesia Post-op Follow-up Note (Signed)
Anesthesia QCDR form completed.        

## 2017-06-30 NOTE — Progress Notes (Signed)
Pharmacy Antibiotic Note  Kimberly Shaffer is a 66 y.o. female admitted on 06/27/2017 with Abdominal Pain. Pharmacy consulted for vancomycin and ceftazidime dosing for suspected PD peritonitis.   Plan: Plan is for patient to get HD on T, Th, Sat.  Pt received Vancomycin 1750 mg x 1 dose on 10/10, continue  vancomycin 1g IV after every HD.  Will draw vanc trough prior to 3rd HD session.  Continue Ceftazidime 1g IV every 24 hours.   Height: 5' 6.5" (168.9 cm) Weight: 229 lb 0.9 oz (103.9 kg) IBW/kg (Calculated) : 60.45  Temp (24hrs), Avg:98.1 F (36.7 C), Min:97.8 F (36.6 C), Max:98.6 F (37 C)   Recent Labs Lab 06/27/17 1803 06/29/17 1005  WBC 5.1 4.4  CREATININE 9.99* 5.67*    Estimated Creatinine Clearance: 12 mL/min (A) (by C-G formula based on SCr of 5.67 mg/dL (H)).    Allergies  Allergen Reactions  . Nystatin Hives and Itching  . Sulfa Antibiotics Swelling, Hives and Rash  . Niaspan [Niacin] Other (See Comments) and Hives    Antimicrobials this admission: 10/10 ceftriaxone >>  10/10 vancomycin  >>   Dose adjustments this admission:   Microbiology results: 10/10  BCx: NGTD  Thank you for allowing pharmacy to be a part of this patient's care.  Rocky Morel, PharmD, BCPS Clinical Pharmacist 06/30/2017 8:15 AM

## 2017-07-01 ENCOUNTER — Encounter: Payer: Self-pay | Admitting: Vascular Surgery

## 2017-07-01 LAB — GLUCOSE, CAPILLARY: Glucose-Capillary: 117 mg/dL — ABNORMAL HIGH (ref 65–99)

## 2017-07-01 LAB — PHOSPHORUS: PHOSPHORUS: 6.2 mg/dL — AB (ref 2.5–4.6)

## 2017-07-01 MED ORDER — VANCOMYCIN HCL IN DEXTROSE 1-5 GM/200ML-% IV SOLN: INTRAVENOUS | Status: DC

## 2017-07-01 MED ORDER — DEXTROSE 5 % IV SOLN
INTRAVENOUS | Status: DC
  Filled 2017-07-01: qty 1

## 2017-07-01 MED ORDER — SEVELAMER CARBONATE 2.4 G PO PACK
2.4000 g | PACK | Freq: Three times a day (TID) | ORAL | Status: DC
  Administered 2017-07-01: 16:00:00 2.4 g via ORAL
  Filled 2017-07-01 (×6): qty 1

## 2017-07-01 MED ORDER — NICOTINE 21 MG/24HR TD PT24: 21.0000 mg | MEDICATED_PATCH | Freq: Every day | TRANSDERMAL | 0 refills | Status: DC

## 2017-07-01 MED ORDER — SODIUM CHLORIDE 0.9% FLUSH
3.0000 mL | INTRAVENOUS | Status: DC | PRN
  Administered 2017-07-01: 17:00:00 3 mL via INTRAVENOUS
  Filled 2017-07-01: qty 3

## 2017-07-01 MED ORDER — HYDROCODONE-ACETAMINOPHEN 5-325 MG PO TABS: 1.0000 | ORAL_TABLET | Freq: Four times a day (QID) | ORAL | 0 refills | Status: DC | PRN

## 2017-07-01 MED ORDER — SODIUM CHLORIDE 0.9% FLUSH: 3.0000 mL | Freq: Two times a day (BID) | INTRAVENOUS | Status: DC

## 2017-07-01 NOTE — Discharge Summary (Signed)
[facility] [place] [facility] NAME: Kimberly Shaffer    MR#:  [id number]  DATE OF BIRTH:  1951-03-17  DATE OF ADMISSION:  06/27/2017 ADMITTING PHYSICIAN: Harrie Foreman, MD  DATE OF DISCHARGE: 07/01/2017  PRIMARY CARE PHYSICIAN: [place], [facility]    ADMISSION DIAGNOSIS:  peritonitis  DISCHARGE DIAGNOSIS:  peritonitis  SECONDARY DIAGNOSIS:   Past Medical History:  Diagnosis Date  . Asthma   . CAD (coronary artery disease)   . Chronic lower back pain   . COPD (chronic obstructive pulmonary disease) ([place])   . Depression   . Diabetes mellitus without complication (HCC)    NIDD  . H/O angina pectoris   . H/O blood clots   . Hypercholesteremia   . Hypertension   . Left rotator cuff tear   . Myocardial infarction ([place])   . Obesity   . Renal insufficiency   . Stroke ([place])   . Vertigo, aural     HOSPITAL COURSE:   1.  Acute abdominal pain secondary to peritonitis. Patient feeling much better after her PD catheter was removed yesterday by Dr. Delana Meyer vasclar surgery.  The patient was given IV vancomycin and ceftazidime during the hospital course. She will be continued on vancomycin1 g IV with dialysis for the next 6 sessions. 2. End-stage renal disease on hemodialysis 3. Essential hypertension on Norvasc and metoprolol 4. COPD continue inhalers 5. GERD on Protonix 6. Hyperlipidemia unspecified on Crestor 7. Neuropathy on gabapentin 8. Secondary hyperparathyroidism on Sensipar and Renvela  DISCHARGE CONDITIONS:   satisfactory  CONSULTS OBTAINED:  Treatment Team:  Anthonette Legato, MD Algernon Huxley, MD  DRUG ALLERGIES:   Allergies  Allergen Reactions  . Nystatin Hives and Itching  . Sulfa Antibiotics Swelling, Hives and Rash  . Niaspan [Niacin] Other (See Comments) and Hives    DISCHARGE MEDICATIONS:   Current Discharge Medication List    START taking these medications   Details  HYDROcodone-acetaminophen  (NORCO/VICODIN) 5-325 MG tablet Take 1 tablet by mouth every 6 (six) hours as needed for moderate pain. Qty: 12 tablet, Refills: 0    nicotine (NICODERM CQ - DOSED IN MG/24 HOURS) 21 mg/24hr patch Place 1 patch (21 mg total) onto the skin daily. Qty: 28 patch, Refills: 0    vancomycin (VANCOCIN) 1-5 GM/200ML-% SOLN 100omg iv with each dialysis for six more sessions Qty: 4000 mL      CONTINUE these medications which have NOT CHANGED   Details  albuterol (PROVENTIL HFA;VENTOLIN HFA) 108 (90 BASE) MCG/ACT inhaler Inhale into the lungs every 4 (four) hours as needed for wheezing or shortness of breath. 2-4 puffs    amLODipine (NORVASC) 10 MG tablet Take 10 mg by mouth daily.    aspirin EC 81 MG tablet Take 81 mg by mouth daily.    calcium carbonate (OSCAL) 1500 (600 Ca) MG TABS tablet Take 600 mg of elemental calcium by mouth daily with breakfast.    folic acid (FOLVITE) 1 MG tablet Take 1 mg by mouth daily.    gabapentin (NEURONTIN) 100 MG capsule Take 100 mg by mouth every morning.    ipratropium (ATROVENT) 0.02 % nebulizer solution Take 2.5 mLs (0.5 mg total) by nebulization every 6 (six) hours as needed for wheezing or shortness of breath. Qty: 125 mL, Refills: 5    levocetirizine (XYZAL) 5 MG tablet Take 5 mg by mouth daily.    lubiprostone (AMITIZA) 24 MCG capsule Take 24 mcg by mouth daily as needed  for constipation.    meclizine (ANTIVERT) 12.5 MG tablet Take 1 tablet (12.5 mg total) by mouth 3 (three) times daily as needed for dizziness or nausea. Qty: 30 tablet, Refills: 1    methocarbamol (ROBAXIN) 500 MG tablet Take 500-750 mg by mouth 2 (two) times daily as needed. For muscle spasms    metoprolol succinate (TOPROL-XL) 100 MG 24 hr tablet Take 100 mg by mouth daily. Take with or immediately following a meal.    nitroGLYCERIN (NITROLINGUAL) 0.4 MG/SPRAY spray Place 1 spray under the tongue every 5 (five) minutes x 3 doses as needed for chest pain.    pantoprazole  (PROTONIX) 40 MG tablet Take 40 mg by mouth 2 (two) times daily.    Pramoxine-Dimethicone 1-6 % CREA Place 1 application rectally 2 (two) times daily as needed (itching, burning, irritation, or other rectal discomfort caused by hemorrhoids).     RENVELA 2.4 g PACK Take 2.4 g by mouth 2 (two) times daily.    rosuvastatin (CRESTOR) 20 MG tablet Take 20 mg by mouth daily.    SENSIPAR 30 MG tablet Take 30 mg by mouth daily.    SPIRIVA HANDIHALER 18 MCG inhalation capsule Place 1 capsule into inhaler and inhale daily.    SYMBICORT 160-4.5 MCG/ACT inhaler Inhale 2 puffs into the lungs 2 (two) times daily.    venlafaxine XR (EFFEXOR-XR) 75 MG 24 hr capsule Take 75 mg by mouth daily.    VITAMIN E PO Take 1 capsule by mouth daily.      STOP taking these medications     Oxycodone HCl 10 MG TABS      diclofenac (FLECTOR) 1.3 % PTCH      Fluticasone-Salmeterol (ADVAIR DISKUS) 250-50 MCG/DOSE AEPB      traMADol (ULTRAM) 50 MG tablet          DISCHARGE INSTRUCTIONS:   Follow-up with dialysis as scheduled Follow-up PMD one week Follow-up vascular surgery 2 weeks  If you experience worsening of your admission symptoms, develop shortness of breath, life threatening emergency, suicidal or homicidal thoughts you must seek medical attention immediately by calling 911 or calling your MD immediately  if symptoms less severe.  You Must read complete instructions/literature along with all the possible adverse reactions/side effects for all the Medicines you take and that have been prescribed to you. Take any new Medicines after you have completely understood and accept all the possible adverse reactions/side effects.   Please note  You were cared for by a hospitalist during your hospital stay. If you have any questions about your discharge medications or the care you received while you were in the hospital after you are discharged, you can call the unit and asked to speak with the hospitalist on  call if the hospitalist that took care of you is not available. Once you are discharged, your primary care physician will handle any further medical issues. Please note that NO REFILLS for any discharge medications will be authorized once you are discharged, as it is imperative that you return to your primary care physician (or establish a relationship with a primary care physician if you do not have one) for your aftercare needs so that they can reassess your need for medications and monitor your lab values.    Today   CHIEF COMPLAINT:   Chief Complaint  Patient presents with  . pain at catheter site    HISTORY OF PRESENT ILLNESS:  Kimberly Shaffer  is a 66 y.o. female presented with abdominal pain  VITAL SIGNS:  Blood pressure 121/50. Pulse 65 respirations 20 pulse ox 91%    PHYSICAL EXAMINATION:  GENERAL:  66 y.o.-year-old patient lying in the bed with no acute distress.  EYES: Pupils equal, round, reactive to light and accommodation. No scleral icterus. Extraocular muscles intact.  HEENT: Head atraumatic, normocephalic. Oropharynx and nasopharynx clear.  NECK:  Supple, no jugular venous distention. No thyroid enlargement, no tenderness.  LUNGS: Normal breath sounds bilaterally, no wheezing, rales,rhonchi or crepitation. No use of accessory muscles of respiration.  CARDIOVASCULAR: S1, S2 normal. No murmurs, rubs, or gallops.  ABDOMEN: Soft, slight lower abdominal tenderness, non-distended. Bowel sounds present. No organomegaly or mass.  EXTREMITIES: No pedal edema, cyanosis, or clubbing.  NEUROLOGIC: Cranial nerves II through XII are intact. Muscle strength 5/5 in all extremities. Sensation intact. Gait not checked.  PSYCHIATRIC: The patient is alert and oriented x 3.  SKIN: No obvious rash, lesion, or ulcer.   DATA REVIEW:   CBC  Recent Labs Lab 06/29/17 1005  WBC 4.4  HGB 10.2*  HCT 30.9*  PLT 175    Chemistries   Recent Labs Lab 06/27/17 1803 06/29/17 1005   NA 139 135  K 4.5 3.4*  CL 103 97*  CO2 20* 23  GLUCOSE 133* 116*  BUN 88* 41*  CREATININE 9.99* 5.67*  CALCIUM 8.0* 8.1*  AST 26  --   ALT 20  --   ALKPHOS 70  --   BILITOT 0.6  --      Microbiology Results  Results for orders placed or performed during the hospital encounter of 06/27/17  Blood culture (routine x 2)     Status: None (Preliminary result)   Collection Time: 06/28/17  1:42 AM  Result Value Ref Range Status   Specimen Description BLOOD RIGHT ANTECUBITAL  Final   Special Requests   Final    BOTTLES DRAWN AEROBIC AND ANAEROBIC Blood Culture adequate volume   Culture NO GROWTH 3 DAYS  Final   Report Status PENDING  Incomplete  Blood culture (routine x 2)     Status: None (Preliminary result)   Collection Time: 06/28/17  1:42 AM  Result Value Ref Range Status   Specimen Description BLOOD BLOOD RIGHT HAND  Final   Special Requests   Final    BOTTLES DRAWN AEROBIC AND ANAEROBIC Blood Culture results may not be optimal due to an inadequate volume of blood received in culture bottles   Culture NO GROWTH 3 DAYS  Final   Report Status PENDING  Incomplete     Management plans discussed with the patient, family and they are in agreement.  CODE STATUS:     Code Status Orders        Start     Ordered   06/28/17 0750  Full code  Continuous     06/28/17 0749    Code Status History    Date Active Date Inactive Code Status Order ID Comments User Context   07/19/2016  8:57 AM 07/19/2016  1:19 PM Full Code [id number]  Corey Skains, MD Inpatient   07/05/2016 10:36 PM 07/09/2016  7:50 PM Full Code [id number]  Harvie Bridge, DO Inpatient   12/17/2015  8:12 AM 12/20/2015  7:57 PM Full Code [id number]  Saundra Shelling, MD Inpatient      TOTAL TIME TAKING CARE OF THIS PATIENT: 35 minutes.    Loletha Grayer M.D on 07/01/2017 at 2:59 PM  Between 7am to 6pm - Pager - [phone]  After 6pm go to [email or web address] -  password [email or web address]   [phone]  CC: Primary care physician; [facility]

## 2017-07-01 NOTE — Progress Notes (Signed)
Discharged home to self/family care. Transported in transport chair to private vehicle. When assisting pt with belongings bags, found pt had packed SCD pump, our towels/washcloths and sheets which were removed from her bag.

## 2017-07-01 NOTE — Progress Notes (Signed)
HD COMPLETED  

## 2017-07-01 NOTE — Progress Notes (Signed)
Pt returned from HD with vancomycin still pending with pt reporting she accidentally pulled out in HD when she pulled a tape and has not IV access site. Dr. Leslye Peer notified with orders to restart IV and give vancomycin prior to discharge with pt agreeable. Tylenol and oxycodone given for neck pain and opsites in abdomen.

## 2017-07-01 NOTE — Progress Notes (Signed)
[facility]  ROUNDING NOTE   Subjective:  Patient seen at bedside. She has completed hemodialysis. pD catheter was removed yesterday.   Objective:  Vital signs in last 24 hours:  Temp:  [97.8 F (36.6 C)-98.6 F (37 C)] 98.6 F (37 C) (10/13 1110) Pulse Rate:  [48-74] 70 (10/13 1430) Resp:  [12-20] 16 (10/13 1430) BP: (99-135)/(48-84) 135/67 (10/13 1430) SpO2:  [87 %-99 %] 87 % (10/13 1200) Weight:  [100.5 kg (221 lb 9 oz)-102.9 kg (226 lb 13.7 oz)] 102.9 kg (226 lb 13.7 oz) (10/13 1110)  Weight change: -1.426 kg (-3 lb 2.3 oz) Filed Weights   06/30/17 1236 07/01/17 0500 07/01/17 1110  Weight: 103.9 kg (229 lb) 100.5 kg (221 lb 9 oz) 102.9 kg (226 lb 13.7 oz)    Intake/Output: I/O last 3 completed shifts: In: 60 [P.O.:360; I.V.:250] Out: 3 [Blood:3]   Intake/Output this shift:  Total I/O In: 240 [P.O.:240] Out: -   Physical Exam: General: No acute distress  Head: Normocephalic, atraumatic. Moist oral mucosal membranes  Eyes: Anicteric  Neck: Supple, trachea midline  Lungs:  Clear to auscultation, normal effort  Heart: S1S2 no rubs  Abdomen:  Mild diffuse tenderness, BS present, PD catheter in place  Extremities: No peripheral edema.  Neurologic: Awake, alert, following commands  Skin: No lesions  Access: PD catheter in place, LUE AVF    Basic Metabolic Panel:  Recent Labs Lab 06/27/17 1803 06/28/17 1400 06/29/17 1005 07/01/17 1120  NA 139  --  135  --   K 4.5  --  3.4*  --   CL 103  --  97*  --   CO2 20*  --  23  --   GLUCOSE 133*  --  116*  --   BUN 88*  --  41*  --   CREATININE 9.99*  --  5.67*  --   CALCIUM 8.0*  --  8.1*  --   PHOS  --  7.2* 5.0* 6.2*    Liver Function Tests:  Recent Labs Lab 06/27/17 1803 06/29/17 1005  AST 26  --   ALT 20  --   ALKPHOS 70  --   BILITOT 0.6  --   PROT 8.1  --   ALBUMIN 3.5 3.4*   No results for input(s): LIPASE, AMYLASE in the last 168 hours. No results for input(s): AMMONIA in  the last 168 hours.  CBC:  Recent Labs Lab 06/27/17 1803 06/29/17 1005  WBC 5.1 4.4  HGB 10.8* 10.2*  HCT 33.2* 30.9*  MCV 91.6 89.5  PLT 186 175    Cardiac Enzymes: No results for input(s): CKTOTAL, CKMB, CKMBINDEX, TROPONINI in the last 168 hours.  BNP: Invalid input(s): POCBNP  CBG:  Recent Labs Lab 06/29/17 2146 06/30/17 1242 06/30/17 1400 07/01/17 1126  GLUCAP 157* 95 110* 117*    Microbiology: Results for orders placed or performed during the hospital encounter of 06/27/17  Blood culture (routine x 2)     Status: None (Preliminary result)   Collection Time: 06/28/17  1:42 AM  Result Value Ref Range Status   Specimen Description BLOOD RIGHT ANTECUBITAL  Final   Special Requests   Final    BOTTLES DRAWN AEROBIC AND ANAEROBIC Blood Culture adequate volume   Culture NO GROWTH 3 DAYS  Final   Report Status PENDING  Incomplete  Blood culture (routine x 2)     Status: None (Preliminary result)   Collection Time: 06/28/17  1:42 AM  Result Value Ref  Range Status   Specimen Description BLOOD BLOOD RIGHT HAND  Final   Special Requests   Final    BOTTLES DRAWN AEROBIC AND ANAEROBIC Blood Culture results may not be optimal due to an inadequate volume of blood received in culture bottles   Culture NO GROWTH 3 DAYS  Final   Report Status PENDING  Incomplete    Coagulation Studies: No results for input(s): LABPROT, INR in the last 72 hours.  Urinalysis: No results for input(s): COLORURINE, LABSPEC, PHURINE, GLUCOSEU, HGBUR, BILIRUBINUR, KETONESUR, PROTEINUR, UROBILINOGEN, NITRITE, LEUKOCYTESUR in the last 72 hours.  Invalid input(s): APPERANCEUR    Imaging: No results found.   Medications:   . small volume/piggyback Museum/gallery curator    . vancomycin Stopped (06/29/17 1321)   . amLODipine  10 mg Oral Daily  . aspirin EC  81 mg Oral Daily  . calcium carbonate  600 mg of elemental calcium Oral Q breakfast  . cinacalcet  30 mg Oral Daily  . docusate sodium  100 mg  Oral BID  . folic acid  1 mg Oral Daily  . gabapentin  100 mg Oral q morning - 10a  . heparin  5,000 Units Subcutaneous Q8H  . loratadine  10 mg Oral Daily  . metoprolol succinate  100 mg Oral Daily  . mometasone-formoterol  2 puff Inhalation BID  . nicotine  21 mg Transdermal Daily  . pantoprazole  40 mg Oral BID  . rosuvastatin  20 mg Oral Daily  . sevelamer carbonate  2.4 g Oral BID WC  . sodium chloride flush  3 mL Intravenous Q12H  . tiotropium  1 capsule Inhalation Daily  . venlafaxine XR  75 mg Oral Daily   acetaminophen **OR** acetaminophen, albuterol, hydrocortisone-pramoxine, lubiprostone, meclizine, methocarbamol, nitroGLYCERIN, ondansetron **OR** ondansetron (ZOFRAN) IV, oxyCODONE, sodium chloride flush  Assessment/ Plan:  66 y.o. female with hypertension, coronary artery disease s/p CABG, peripheral vascular disease, diabetes mellitus type II, hyperlipidemia, bilateral total knee replacement, ESRD, anemia of CKD, SHPTH, PD catheter removal 06/30/17.   [facility]  1. ESRD on HD TTS/PD peritonitis:  Patient had PD catheter placed back in August. Unfortunately it has not been functional and she's had one prior episode of peritonitis. We attempted to obtain fluid from her peritoneal cavity this time however her PD catheter is dysfunctional. PD catheter removed 06/30/17. -  PD catheter has been removed. She will be maintained on hemodialysis at this time. Patient completed hemodialysis today.  2.  Anemia of chronic kidney disease. emoglobin stable at 10.2. Hold off on her crit for now.  3. Secondary hyperparathyroidism. Phosphorus still a bit high at 6.2. Maintain the patient on Renvela 2.4 g but increase frequency to 3 times a day.  4.  Hypertension. Continue amlodipine as well as metoprolol.   LOS: 1 Juel Ripley 10/13/20182:51 PM

## 2017-07-01 NOTE — Progress Notes (Signed)
This note also relates to the following rows which could not be included: Pulse Rate - Cannot attach notes to unvalidated device data Resp - Cannot attach notes to unvalidated device data SpO2 - Cannot attach notes to unvalidated device data  HD STARTED

## 2017-07-01 NOTE — Progress Notes (Signed)
PRE DIALYSIS ASSESSMENT 

## 2017-07-01 NOTE — Progress Notes (Signed)
Pt now gone for HD with meds on hold until completes. Tylenol given for chronic neck pain per pt request-effective.

## 2017-07-01 NOTE — Discharge Instructions (Signed)

## 2017-07-01 NOTE — Progress Notes (Signed)
Oral and written AVS instructions given with stated understanding. States she has the norco rx at home. Family here to take pt home.

## 2017-07-03 LAB — CULTURE, BLOOD (ROUTINE X 2)
CULTURE: NO GROWTH
Culture: NO GROWTH
SPECIAL REQUESTS: ADEQUATE

## 2017-07-11 ENCOUNTER — Encounter: Payer: Self-pay | Admitting: Emergency Medicine

## 2017-07-11 DIAGNOSIS — M436 Torticollis: Secondary | ICD-10-CM | POA: Diagnosis not present

## 2017-07-11 DIAGNOSIS — F17210 Nicotine dependence, cigarettes, uncomplicated: Secondary | ICD-10-CM | POA: Insufficient documentation

## 2017-07-11 DIAGNOSIS — Z7982 Long term (current) use of aspirin: Secondary | ICD-10-CM | POA: Insufficient documentation

## 2017-07-11 DIAGNOSIS — E119 Type 2 diabetes mellitus without complications: Secondary | ICD-10-CM | POA: Diagnosis not present

## 2017-07-11 DIAGNOSIS — N186 End stage renal disease: Secondary | ICD-10-CM | POA: Diagnosis not present

## 2017-07-11 DIAGNOSIS — J449 Chronic obstructive pulmonary disease, unspecified: Secondary | ICD-10-CM | POA: Diagnosis not present

## 2017-07-11 DIAGNOSIS — I120 Hypertensive chronic kidney disease with stage 5 chronic kidney disease or end stage renal disease: Secondary | ICD-10-CM | POA: Diagnosis not present

## 2017-07-11 DIAGNOSIS — J45909 Unspecified asthma, uncomplicated: Secondary | ICD-10-CM | POA: Diagnosis not present

## 2017-07-11 DIAGNOSIS — I2510 Atherosclerotic heart disease of native coronary artery without angina pectoris: Secondary | ICD-10-CM | POA: Diagnosis not present

## 2017-07-11 DIAGNOSIS — M542 Cervicalgia: Secondary | ICD-10-CM | POA: Diagnosis present

## 2017-07-11 NOTE — ED Notes (Signed)
Left arm restriction pt reports is a dialysis pt

## 2017-07-11 NOTE — ED Triage Notes (Signed)
Pt reports right neck pain for about a week reports she woke up from a nap with neck discomfort, pt reports she has been  Trying OTC medication to help with pain reports no relief reports pain has increased reports giving her a headache, denies any other symptom at present pt talks in complete sentences no distress noted

## 2017-07-12 ENCOUNTER — Emergency Department
Admission: EM | Admit: 2017-07-12 | Discharge: 2017-07-12 | Disposition: A | Discharge status: Home / Self Care | Payer: Medicare HMO | Attending: Emergency Medicine | Admitting: Emergency Medicine

## 2017-07-12 DIAGNOSIS — M436 Torticollis: Secondary | ICD-10-CM

## 2017-07-12 MED ORDER — LIDOCAINE 5 % EX PTCH
1.0000 | MEDICATED_PATCH | CUTANEOUS | Status: DC
  Administered 2017-07-12: 1 via TRANSDERMAL
  Filled 2017-07-12: qty 1

## 2017-07-12 MED ORDER — METHOCARBAMOL 500 MG PO TABS: 500.0000 mg | ORAL_TABLET | Freq: Three times a day (TID) | ORAL | 0 refills | Status: AC | PRN

## 2017-07-12 MED ORDER — METHOCARBAMOL 500 MG PO TABS
500.0000 mg | ORAL_TABLET | Freq: Once | ORAL | Status: AC
  Administered 2017-07-12: 500 mg via ORAL
  Filled 2017-07-12: qty 1

## 2017-07-12 NOTE — ED Provider Notes (Signed)
[facility] Emergency Department Provider Note    First MD Initiated Contact with Patient 07/12/17 0259     (approximate)  I have reviewed the triage vital signs and the nursing notes.   HISTORY  Chief Complaint Neck Pain    HPI Kimberly Shaffer is a 66 y.o. female with low-dose chronic medical conditions presents to the emergency department with right lateral neck paintimes one week. Patient states that she's taken over-the-counter medications without any relief of discomfort. Patient states that the pain has caused her to have a headache area patient denies any weakness numbness gait instability or visual changes. Patient's BP 143/69 on arrival. Patient states that she took muscle relaxants in the past but methocarbamol however she does not have any at present.   Past Medical History:  Diagnosis Date  . Asthma   . CAD (coronary artery disease)   . Chronic lower back pain   . COPD (chronic obstructive pulmonary disease) ([place])   . Depression   . Diabetes mellitus without complication (HCC)    NIDD  . H/O angina pectoris   . H/O blood clots   . Hypercholesteremia   . Hypertension   . Left rotator cuff tear   . Myocardial infarction ([place])   . Obesity   . Renal insufficiency   . Stroke ([place])   . Vertigo, aural     Patient Active Problem List   Diagnosis Date Noted  . Abdominal pain 06/28/2017  . Bilateral carotid artery stenosis 03/08/2017  . ESRD needing dialysis ([place]) 03/08/2017  . Non-ST elevation myocardial infarction (NSTEMI), subendocardial infarction, subsequent episode of care ([place]) 11/04/2016  . Onychogryphosis 10/31/2016  . Onychomycosis 10/31/2016  . Subungual exostosis 10/31/2016  . Type 2 diabetes mellitus with diabetic neuropathy ([place]) 10/31/2016  . Chronic osteomyelitis of left hand ([place]) 09/03/2016  . Left arm swelling 08/30/2016  . Pre-operative clearance 08/30/2016  . Coronary artery disease of native artery of native heart  with stable angina pectoris ([place]) 08/03/2016  . Benign paroxysmal positional vertigo due to bilateral vestibular disorder 07/26/2016  . H/O: CVA (cerebrovascular accident) 07/26/2016  . Stable angina ([place]) 07/05/2016  . Sepsis due to pneumonia ([place]) 07/05/2016  . Fracture of left foot 04/26/2016  . Pre-transplant evaluation for kidney transplant 02/04/2016  . Chest pain at rest 12/17/2015  . Chest pain 12/17/2015  . DJD of shoulder 10/21/2015  . Perianal lesion 05/20/2015  . 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 Lumbar radiculopathy 05/14/2015  . 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 Benign essential hypertension 04/22/2015  . Grief 03/18/2015  . Lumbosacral facet joint syndrome 02/16/2015  . Greater trochanteric bursitis 02/16/2015  . DDD (degenerative disc disease), lumbosacral 01/20/2015  . 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 Osteoarthritis 01/20/2015  . IDA (iron deficiency anemia) 03/25/2014  . Thyroid nodule 01/15/2014  . Lung nodule 08/28/2013  . Tobacco abuse 05/17/2013  . Mixed hyperlipidemia 06/14/2011  . OSA on CPAP 06/14/2011  . PAD (peripheral artery disease) ([place]) 06/14/2011  . Depression 05/05/2011  . Chronic kidney disease (CKD), stage V ([place]) 03/26/2011  . Multiple vessel coronary artery disease 03/26/2011  . Obesity, unspecified 03/26/2011  . Type 2 diabetes mellitus ([place]) 03/26/2011    Past Surgical History:  Procedure Laterality Date  . A/V FISTULAGRAM Left 03/14/2017   Procedure: A/V Fistulagram;  Surgeon: Katha Cabal, MD;  Location: [place] [facility];  Service: Cardiovascular;  Laterality: Left;  . AV FISTULA  PLACEMENT Left 11-04-2015  . AV FISTULA PLACEMENT Left 11/04/2015   Procedure: ARTERIOVENOUS (AV) FISTULA CREATION  ( BRACHIAL CEPHALIC );  Surgeon: Katha Cabal, MD;  Location: [facility];  Service: Vascular;  Laterality: Left;  . CARDIAC CATHETERIZATION    . CARDIAC CATHETERIZATION  Left 07/19/2016   Procedure: Left Heart Cath and Coronary Angiography;  Surgeon: Corey Skains, MD;  Location: [place] [facility];  Service: Cardiovascular;  Laterality: Left;  . CORONARY ARTERY BYPASS GRAFT  2012  . CORONARY STENT PLACEMENT    . JOINT REPLACEMENT    . KNEE SURGERY Bilateral   . PERIPHERAL VASCULAR CATHETERIZATION N/A 11/13/2015   Procedure: Dialysis/Perma Catheter Insertion;  Surgeon: Katha Cabal, MD;  Location: [place] [facility];  Service: Cardiovascular;  Laterality: N/A;  . PERIPHERAL VASCULAR CATHETERIZATION N/A 12/04/2015   Procedure: Dialysis/Perma Catheter Insertion;  Surgeon: Katha Cabal, MD;  Location: [place] [facility];  Service: Cardiovascular;  Laterality: N/A;  . PERIPHERAL VASCULAR CATHETERIZATION Left 12/31/2015   Procedure: A/V Shuntogram/Fistulagram;  Surgeon: Algernon Huxley, MD;  Location: [place] [facility];  Service: Cardiovascular;  Laterality: Left;  . PERIPHERAL VASCULAR CATHETERIZATION N/A 12/31/2015   Procedure: A/V Shunt Intervention;  Surgeon: Algernon Huxley, MD;  Location: [place] [facility];  Service: Cardiovascular;  Laterality: N/A;  . PERIPHERAL VASCULAR CATHETERIZATION Left 02/25/2016   Procedure: A/V Shuntogram/Fistulagram;  Surgeon: Algernon Huxley, MD;  Location: [place] [facility];  Service: Cardiovascular;  Laterality: Left;  . PERIPHERAL VASCULAR CATHETERIZATION N/A 03/18/2016   Procedure: Dialysis/Perma Catheter Removal;  Surgeon: Katha Cabal, MD;  Location: [place] [facility];  Service: Cardiovascular;  Laterality: N/A;  . REMOVAL OF A DIALYSIS CATHETER N/A 06/30/2017   Procedure: REMOVAL OF A DIALYSIS CATHETER;  Surgeon: Katha Cabal, MD;  Location: [facility];  Service: Vascular;  Laterality: N/A;  . REPLACEMENT TOTAL KNEE BILATERAL Bilateral     Prior to Admission medications   Medication Sig Start Date End Date Taking? Authorizing Provider  albuterol (PROVENTIL HFA;VENTOLIN HFA) 108 (90 BASE) MCG/ACT  inhaler Inhale into the lungs every 4 (four) hours as needed for wheezing or shortness of breath. 2-4 puffs    [provider]  amLODipine (NORVASC) 10 MG tablet Take 10 mg by mouth daily.    [provider]  aspirin EC 81 MG tablet Take 81 mg by mouth daily.    [provider]  calcium carbonate (OSCAL) 1500 (600 Ca) MG TABS tablet Take 600 mg of elemental calcium by mouth daily with breakfast.    [provider]  folic acid (FOLVITE) 1 MG tablet Take 1 mg by mouth daily.    [provider]  gabapentin (NEURONTIN) 100 MG capsule Take 100 mg by mouth every morning.    [provider]  HYDROcodone-acetaminophen (NORCO/VICODIN) 5-325 MG tablet Take 1 tablet by mouth every 6 (six) hours as needed for moderate pain. 07/01/17 07/01/18  Loletha Grayer, MD  ipratropium (ATROVENT) 0.02 % nebulizer solution Take 2.5 mLs (0.5 mg total) by nebulization every 6 (six) hours as needed for wheezing or shortness of breath. 01/19/16   Laverle Hobby, MD  levocetirizine (XYZAL) 5 MG tablet Take 5 mg by mouth daily.    [provider]  lubiprostone (AMITIZA) 24 MCG capsule Take 24 mcg by mouth daily as needed for constipation.    [provider]  meclizine (ANTIVERT) 12.5 MG tablet Take 1 tablet (12.5 mg total) by mouth 3 (three) times  daily as needed for dizziness or nausea. 08/27/16   Daymon Larsen, MD  methocarbamol (ROBAXIN) 500 MG tablet Take 500-750 mg by mouth 2 (two) times daily as needed. For muscle spasms 02/28/17   [provider]  metoprolol succinate (TOPROL-XL) 100 MG 24 hr tablet Take 100 mg by mouth daily. Take with or immediately following a meal.    [provider]  nicotine (NICODERM CQ - DOSED IN MG/24 HOURS) 21 mg/24hr patch Place 1 patch (21 mg total) onto the skin daily. 07/01/17   Loletha Grayer, MD  nitroGLYCERIN (NITROLINGUAL) 0.4 MG/SPRAY spray Place 1 spray under the tongue every 5 (five)  minutes x 3 doses as needed for chest pain.    [provider]  pantoprazole (PROTONIX) 40 MG tablet Take 40 mg by mouth 2 (two) times daily.    [provider]  Pramoxine-Dimethicone 1-6 % CREA Place 1 application rectally 2 (two) times daily as needed (itching, burning, irritation, or other rectal discomfort caused by hemorrhoids).     [provider]  RENVELA 2.4 g PACK Take 2.4 g by mouth 2 (two) times daily.    [provider]  rosuvastatin (CRESTOR) 20 MG tablet Take 20 mg by mouth daily.    [provider]  SENSIPAR 30 MG tablet Take 30 mg by mouth daily. 07/01/16   [provider]  SPIRIVA HANDIHALER 18 MCG inhalation capsule Place 1 capsule into inhaler and inhale daily.    [provider]  SYMBICORT 160-4.5 MCG/ACT inhaler Inhale 2 puffs into the lungs 2 (two) times daily.    [provider]  vancomycin (VANCOCIN) 1-5 GM/200ML-% SOLN 100omg iv with each dialysis for six more sessions 07/01/17   Loletha Grayer, MD  venlafaxine XR (EFFEXOR-XR) 75 MG 24 hr capsule Take 75 mg by mouth daily.    [provider]  VITAMIN E PO Take 1 capsule by mouth daily.    [provider]    Allergies Nystatin; Sulfa antibiotics; and Niaspan [niacin]  Family History  Problem Relation Age of Onset  . Cancer Mother   . Cancer Father   . Diabetes Brother   . Heart disease Brother     Social History Social History  Substance Use Topics  . Smoking status: Current Every Day Smoker    Packs/day: 0.25    Types: Cigarettes  . Smokeless tobacco: Never Used  . Alcohol use No    Review of Systems Constitutional: No fever/chills Eyes: No visual changes. ENT: No sore throat. Cardiovascular: Denies chest pain. Respiratory: Denies shortness of breath. Gastrointestinal: No abdominal pain.  No nausea, no vomiting.  No diarrhea.  No constipation. Genitourinary: Negative for dysuria. Musculoskeletal: Positive for  neck pain.  Negative for back pain. Integumentary: Negative for rash. Neurological: Negative for headaches, focal weakness or numbness.  ____________________________________________   PHYSICAL EXAM:  VITAL SIGNS: ED Triage Vitals  Enc Vitals Group     BP 07/11/17 2239 (!) 143/69     Pulse Rate 07/11/17 2239 67     Resp 07/11/17 2239 20     Temp 07/11/17 2239 98.6 F (37 C)     Temp Source 07/11/17 2239 Oral     SpO2 07/11/17 2239 99 %     Weight 07/11/17 2235 100.2 kg (221 lb)     Height 07/11/17 2235 1.689 m (5' 6.5")     Head Circumference --      Peak Flow --      Pain Score 07/11/17 2234  9     Pain Loc --      Pain Edu? --      Excl. in [place]? --     Constitutional: Alert and oriented. Well appearing and in no acute distress. Eyes: Conjunctivae are normal.  Head: Atraumatic. Mouth/Throat: Mucous membranes are moist.  Oropharynx non-erythematous. Neck: No stridor.  pain palpation of the right trapezius muscle. Cardiovascular: Normal rate, regular rhythm. Good peripheral circulation. Grossly normal heart sounds. Respiratory: Normal respiratory effort.  No retractions. Lungs CTAB. Gastrointestinal: Soft and nontender. No distention.  Musculoskeletal: No lower extremity tenderness nor edema. No gross deformities of extremities. Neurologic:  Normal speech and language. No gross focal neurologic deficits are appreciated.  Skin:  Skin is warm, dry and intact. No rash noted. Psychiatric: Mood and affect are normal. Speech and behavior are normal.    Procedures   ____________________________________________   INITIAL IMPRESSION / ASSESSMENT AND PLAN / ED COURSE  As part of my medical decision making, I reviewed the following data within the electronic MEDICAL RECORD [id number] year old female presenting with history of physical exam consistent with acute torticollis. Considered possibility of posterior circulation pathology however patient normotensive no focal neurological  deficits no headache at present. As such suspect this to be unlikely. Patient given Robaxin emergency department will be prescribed same for home.     ____________________________________________  FINAL CLINICAL IMPRESSION(S) / ED DIAGNOSES  Final diagnoses:  Torticollis, acute     MEDICATIONS GIVEN DURING THIS VISIT:  Medications  lidocaine (LIDODERM) 5 % 1 patch (not administered)  methocarbamol (ROBAXIN) tablet 500 mg (not administered)     NEW OUTPATIENT MEDICATIONS STARTED DURING THIS VISIT:  New Prescriptions   No medications on file    Modified Medications   No medications on file    Discontinued Medications   No medications on file     Note:  This document was prepared using Dragon voice recognition software and may include unintentional dictation errors.    Gregor Hams, MD 07/12/17 [phone]

## 2017-07-13 NOTE — ED Notes (Signed)
[facility] at [phone] to change Rx from robaxin that is on back order to flexeril 5mg  q8h prn, qty 20 per Dr. Archie Balboa.

## 2017-11-16 ENCOUNTER — Emergency Department: Payer: Medicare HMO

## 2017-11-16 ENCOUNTER — Encounter: Payer: Self-pay | Admitting: Emergency Medicine

## 2017-11-16 ENCOUNTER — Emergency Department
Admission: EM | Admit: 2017-11-16 | Discharge: 2017-11-16 | Disposition: A | Discharge status: Home / Self Care | Payer: Medicare HMO | Attending: Emergency Medicine | Admitting: Emergency Medicine

## 2017-11-16 ENCOUNTER — Other Ambulatory Visit: Payer: Self-pay

## 2017-11-16 DIAGNOSIS — Y999 Unspecified external cause status: Secondary | ICD-10-CM | POA: Diagnosis not present

## 2017-11-16 DIAGNOSIS — I252 Old myocardial infarction: Secondary | ICD-10-CM | POA: Insufficient documentation

## 2017-11-16 DIAGNOSIS — Z7901 Long term (current) use of anticoagulants: Secondary | ICD-10-CM | POA: Diagnosis not present

## 2017-11-16 DIAGNOSIS — E1122 Type 2 diabetes mellitus with diabetic chronic kidney disease: Secondary | ICD-10-CM | POA: Insufficient documentation

## 2017-11-16 DIAGNOSIS — Z992 Dependence on renal dialysis: Secondary | ICD-10-CM | POA: Insufficient documentation

## 2017-11-16 DIAGNOSIS — S9031XA Contusion of right foot, initial encounter: Secondary | ICD-10-CM | POA: Diagnosis not present

## 2017-11-16 DIAGNOSIS — Y939 Activity, unspecified: Secondary | ICD-10-CM | POA: Diagnosis not present

## 2017-11-16 DIAGNOSIS — I2510 Atherosclerotic heart disease of native coronary artery without angina pectoris: Secondary | ICD-10-CM | POA: Diagnosis not present

## 2017-11-16 DIAGNOSIS — Y929 Unspecified place or not applicable: Secondary | ICD-10-CM | POA: Diagnosis not present

## 2017-11-16 DIAGNOSIS — Z96653 Presence of artificial knee joint, bilateral: Secondary | ICD-10-CM | POA: Diagnosis not present

## 2017-11-16 DIAGNOSIS — N186 End stage renal disease: Secondary | ICD-10-CM | POA: Diagnosis not present

## 2017-11-16 DIAGNOSIS — I120 Hypertensive chronic kidney disease with stage 5 chronic kidney disease or end stage renal disease: Secondary | ICD-10-CM | POA: Insufficient documentation

## 2017-11-16 DIAGNOSIS — W208XXA Other cause of strike by thrown, projected or falling object, initial encounter: Secondary | ICD-10-CM | POA: Diagnosis not present

## 2017-11-16 DIAGNOSIS — J45909 Unspecified asthma, uncomplicated: Secondary | ICD-10-CM | POA: Insufficient documentation

## 2017-11-16 DIAGNOSIS — S99921A Unspecified injury of right foot, initial encounter: Secondary | ICD-10-CM | POA: Diagnosis present

## 2017-11-16 MED ORDER — MELOXICAM 15 MG PO TABS: 15.0000 mg | ORAL_TABLET | Freq: Every day | ORAL | 2 refills | Status: DC

## 2017-11-16 NOTE — Discharge Instructions (Signed)
Wear the Ace wrap and wooden shoe for comfort for the next few days.  If you are not better in 1 week he should follow-up with your regular doctor and may need an additional x-ray.  If you are worsening he can return to the emergency department.  Apply ice to the area that hurts.  Take the meloxicam daily.  You can also take Tylenol.

## 2017-11-16 NOTE — ED Notes (Signed)

## 2017-11-16 NOTE — ED Provider Notes (Signed)
[facility] Emergency Department Provider Note  ____________________________________________   First MD Initiated Contact with Patient 11/16/17 1855     (approximate)  I have reviewed the triage vital signs and the nursing notes.   HISTORY  Chief Complaint Foot Pain    HPI Kimberly Shaffer is a 68 y.o. female complains of right foot pain.  She states she dropped an end table across the top of her foot.  States the area has been painful to touch, walk on, and just lay there in general.  She denies any other injuries.  She is currently a dialysis patient  Past Medical History:  Diagnosis Date  . Asthma   . CAD (coronary artery disease)   . Chronic lower back pain   . COPD (chronic obstructive pulmonary disease) ([place])   . Depression   . Diabetes mellitus without complication (HCC)    NIDD  . H/O angina pectoris   . H/O blood clots   . Hypercholesteremia   . Hypertension   . Left rotator cuff tear   . Myocardial infarction ([place])   . Obesity   . Renal insufficiency   . Stroke ([place])   . Vertigo, aural     Patient Active Problem List   Diagnosis Date Noted  . Abdominal pain 06/28/2017  . Bilateral carotid artery stenosis 03/08/2017  . ESRD needing dialysis ([place]) 03/08/2017  . Non-ST elevation myocardial infarction (NSTEMI), subendocardial infarction, subsequent episode of care ([place]) 11/04/2016  . Onychogryphosis 10/31/2016  . Onychomycosis 10/31/2016  . Subungual exostosis 10/31/2016  . Type 2 diabetes mellitus with diabetic neuropathy ([place]) 10/31/2016  . Chronic osteomyelitis of left hand ([place]) 09/03/2016  . Left arm swelling 08/30/2016  . Pre-operative clearance 08/30/2016  . Coronary artery disease of native artery of native heart with stable angina pectoris ([place]) 08/03/2016  . Benign paroxysmal positional vertigo due to bilateral vestibular disorder 07/26/2016  . H/O: CVA (cerebrovascular accident) 07/26/2016  . Stable angina ([place])  07/05/2016  . Sepsis due to pneumonia ([place]) 07/05/2016  . Fracture of left foot 04/26/2016  . Pre-transplant evaluation for kidney transplant 02/04/2016  . Chest pain at rest 12/17/2015  . Chest pain 12/17/2015  . DJD of shoulder 10/21/2015  . Perianal lesion 05/20/2015  . 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 Lumbar radiculopathy 05/14/2015  . 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 Benign essential hypertension 04/22/2015  . Grief 03/18/2015  . Lumbosacral facet joint syndrome 02/16/2015  . Greater trochanteric bursitis 02/16/2015  . DDD (degenerative disc disease), lumbosacral 01/20/2015  . 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 Osteoarthritis 01/20/2015  . IDA (iron deficiency anemia) 03/25/2014  . Thyroid nodule 01/15/2014  . Lung nodule 08/28/2013  . Tobacco abuse 05/17/2013  . Mixed hyperlipidemia 06/14/2011  . OSA on CPAP 06/14/2011  . PAD (peripheral artery disease) ([place]) 06/14/2011  . Depression 05/05/2011  . Chronic kidney disease (CKD), stage V ([place]) 03/26/2011  . Multiple vessel coronary artery disease 03/26/2011  . Obesity, unspecified 03/26/2011  . Type 2 diabetes mellitus ([place]) 03/26/2011    Past Surgical History:  Procedure Laterality Date  . A/V FISTULAGRAM Left 03/14/2017   Procedure: A/V Fistulagram;  Surgeon: Katha Cabal, MD;  Location: [facility];  Service: Cardiovascular;  Laterality: Left;  . AV FISTULA PLACEMENT Left 11-04-2015  . AV FISTULA PLACEMENT Left 11/04/2015   Procedure: ARTERIOVENOUS (AV) FISTULA CREATION  ( BRACHIAL CEPHALIC );  Surgeon: Katha Cabal, MD;  Location: [facility];  Service: Vascular;  Laterality: Left;  . CARDIAC CATHETERIZATION    . CARDIAC CATHETERIZATION Left 07/19/2016   Procedure: Left Heart Cath and Coronary Angiography;  Surgeon: Corey Skains, MD;  Location: [facility];  Service: Cardiovascular;  Laterality: Left;  . CORONARY ARTERY BYPASS  GRAFT  2012  . CORONARY STENT PLACEMENT    . JOINT REPLACEMENT    . KNEE SURGERY Bilateral   . PERIPHERAL VASCULAR CATHETERIZATION N/A 11/13/2015   Procedure: Dialysis/Perma Catheter Insertion;  Surgeon: Katha Cabal, MD;  Location: [place] [facility];  Service: Cardiovascular;  Laterality: N/A;  . PERIPHERAL VASCULAR CATHETERIZATION N/A 12/04/2015   Procedure: Dialysis/Perma Catheter Insertion;  Surgeon: Katha Cabal, MD;  Location: [place] [facility];  Service: Cardiovascular;  Laterality: N/A;  . PERIPHERAL VASCULAR CATHETERIZATION Left 12/31/2015   Procedure: A/V Shuntogram/Fistulagram;  Surgeon: Algernon Huxley, MD;  Location: [facility];  Service: Cardiovascular;  Laterality: Left;  . PERIPHERAL VASCULAR CATHETERIZATION N/A 12/31/2015   Procedure: A/V Shunt Intervention;  Surgeon: Algernon Huxley, MD;  Location: [place];  Service: Cardiovascular;  Laterality: N/A;  . PERIPHERAL VASCULAR CATHETERIZATION Left 02/25/2016   Procedure: A/V Shuntogram/Fistulagram;  Surgeon: Algernon Huxley, MD;  Location: [place] [facility];  Service: Cardiovascular;  Laterality: Left;  . PERIPHERAL VASCULAR CATHETERIZATION N/A 03/18/2016   Procedure: Dialysis/Perma Catheter Removal;  Surgeon: Katha Cabal, MD;  Location: [place] [facility];  Service: Cardiovascular;  Laterality: N/A;  . REMOVAL OF A DIALYSIS CATHETER N/A 06/30/2017   Procedure: REMOVAL OF A DIALYSIS CATHETER;  Surgeon: Katha Cabal, MD;  Location: [facility];  Service: Vascular;  Laterality: N/A;  . REPLACEMENT TOTAL KNEE BILATERAL Bilateral     Prior to Admission medications   Medication Sig Start Date End Date Taking? Authorizing Provider  albuterol (PROVENTIL HFA;VENTOLIN HFA) 108 (90 BASE) MCG/ACT inhaler Inhale into the lungs every 4 (four) hours as needed for wheezing or shortness of breath. 2-4 puffs    [provider]  amLODipine (NORVASC) 10 MG tablet Take 10 mg by mouth daily.    [provider]  aspirin EC 81 MG tablet Take 81 mg by mouth daily.    [provider]  calcium carbonate (OSCAL) 1500 (600 Ca) MG TABS tablet Take 600 mg of elemental calcium by mouth daily with breakfast.    [provider]  folic acid (FOLVITE) 1 MG tablet Take 1 mg by mouth daily.    [provider]  gabapentin (NEURONTIN) 100 MG capsule Take 100 mg by mouth every morning.    [provider]  HYDROcodone-acetaminophen (NORCO/VICODIN) 5-325 MG tablet Take 1 tablet by mouth every 6 (six) hours as needed for moderate pain. 07/01/17 07/01/18  Loletha Grayer, MD  ipratropium (ATROVENT) 0.02 % nebulizer solution Take 2.5 mLs (0.5 mg total) by nebulization every 6 (six) hours as needed for wheezing or shortness of breath. 01/19/16   Laverle Hobby, MD  levocetirizine (XYZAL) 5 MG tablet Take 5 mg by mouth daily.    [provider]  lubiprostone (AMITIZA) 24 MCG capsule Take 24 mcg by mouth daily as needed for constipation.    [provider]  meclizine (ANTIVERT) 12.5 MG tablet Take 1 tablet (12.5 mg total) by mouth 3 (three) times daily as needed for dizziness or nausea. 08/27/16   Daymon Larsen, MD  meloxicam (MOBIC) 15 MG tablet Take 1 tablet (15 mg total) by mouth daily.  11/16/17 11/16/18  Fisher, Linden Dolin, PA-C  methocarbamol (ROBAXIN) 500 MG tablet Take 500-750 mg by mouth 2 (two) times daily as needed. For muscle spasms 02/28/17   [provider]  metoprolol succinate (TOPROL-XL) 100 MG 24 hr tablet Take 100 mg by mouth daily. Take with or immediately following a meal.    [provider]  nicotine (NICODERM CQ - DOSED IN MG/24 HOURS) 21 mg/24hr patch Place 1 patch (21 mg total) onto the skin daily. 07/01/17   Loletha Grayer, MD  nitroGLYCERIN (NITROLINGUAL) 0.4 MG/SPRAY spray Place 1 spray under the tongue every 5 (five) minutes x 3 doses as needed for chest pain.    [provider]  pantoprazole (PROTONIX) 40  MG tablet Take 40 mg by mouth 2 (two) times daily.    [provider]  Pramoxine-Dimethicone 1-6 % CREA Place 1 application rectally 2 (two) times daily as needed (itching, burning, irritation, or other rectal discomfort caused by hemorrhoids).     [provider]  RENVELA 2.4 g PACK Take 2.4 g by mouth 2 (two) times daily.    [provider]  rosuvastatin (CRESTOR) 20 MG tablet Take 20 mg by mouth daily.    [provider]  SENSIPAR 30 MG tablet Take 30 mg by mouth daily. 07/01/16   [provider]  SPIRIVA HANDIHALER 18 MCG inhalation capsule Place 1 capsule into inhaler and inhale daily.    [provider]  SYMBICORT 160-4.5 MCG/ACT inhaler Inhale 2 puffs into the lungs 2 (two) times daily.    [provider]  vancomycin (VANCOCIN) 1-5 GM/200ML-% SOLN 100omg iv with each dialysis for six more sessions 07/01/17   Loletha Grayer, MD  venlafaxine XR (EFFEXOR-XR) 75 MG 24 hr capsule Take 75 mg by mouth daily.    [provider]  VITAMIN E PO Take 1 capsule by mouth daily.    [provider]    Allergies Nystatin; Sulfa antibiotics; and Niaspan [niacin]  Family History  Problem Relation Age of Onset  . Cancer Mother   . Cancer Father   . Diabetes Brother   . Heart disease Brother     Social History Social History   Tobacco Use  . Smoking status: Current Every Day Smoker    Packs/day: 0.25    Types: Cigarettes  . Smokeless tobacco: Never Used  Substance Use Topics  . Alcohol use: No    Alcohol/week: 0.0 oz  . Drug use: No    Review of Systems  Constitutional: No fever/chills Eyes: No visual changes. ENT: No sore throat. Respiratory: Denies cough Genitourinary: Negative for dysuria. Musculoskeletal: Negative for back pain.  Positive for right foot pain Skin: Negative for rash.    ____________________________________________   PHYSICAL EXAM:  VITAL SIGNS: ED Triage Vitals  Enc Vitals  Group     BP 11/16/17 1902 (!) 158/67     Pulse Rate 11/16/17 1902 67     Resp 11/16/17 1902 20     Temp 11/16/17 1902 98.1 F (36.7 C)     Temp Source 11/16/17 1902 Oral     SpO2 11/16/17 1902 98 %     Weight 11/16/17 1850 227 lb (103 kg)     Height 11/16/17 1850 5\' 6"  (1.676 m)     Head Circumference --      Peak Flow --      Pain Score 11/16/17 1850 10     Pain Loc --      Pain Edu? --  Excl. in Advance? --     Constitutional: Alert and oriented. Well appearing and in no acute distress. Eyes: Conjunctivae are normal.  Head: Atraumatic. Nose: No congestion/rhinnorhea. Mouth/Throat: Mucous membranes are moist.   Cardiovascular: Normal rate, regular rhythm. Respiratory: Normal respiratory effort.  No retractions GU: deferred Musculoskeletal: FROM all extremities, warm and well perfused.  Right foot is tender swollen and bruised across the distal metatarsals and base of the toes.  She is neurovascularly intact Neurologic:  Normal speech and language.  Skin:  Skin is warm, dry and intact. No rash noted.  Positive for bruising Psychiatric: Mood and affect are normal. Speech and behavior are normal.  ____________________________________________   LABS (all labs ordered are listed, but only abnormal results are displayed)  Labs Reviewed - No data to display ____________________________________________   ____________________________________________  RADIOLOGY  X-ray of the right foot is negative for fracture  ____________________________________________   PROCEDURES  Procedure(s) performed: Ace wrap and wooden shoe are applied by the tech  Procedures    ____________________________________________   INITIAL IMPRESSION / ASSESSMENT AND PLAN / ED COURSE  Pertinent labs & imaging results that were available during my care of the patient were reviewed by me and considered in my medical decision making (see chart for details).  Right foot pain after dropping and  in table on the foot.  On physical exam the right foot is tender and bruised distally  X-ray of the right foot is negative  X-ray results are discussed with patient.  An Ace wrap and wooden shoe were applied by the tech.  Patient was given a prescription of meloxicam.  She is only take this for a few days due to her current kidney functions.  She states she understands will take Tylenol mostly if she can.  She was discharged in stable condition     As part of my medical decision making, I reviewed the following data within the [facility] notes reviewed and incorporated, Radiograph reviewed x-ray of the right foot is negative for fracture, Notes from prior ED visits and [place] Controlled Substance Database  ____________________________________________   FINAL CLINICAL IMPRESSION(S) / ED DIAGNOSES  Final diagnoses:  Contusion of right foot, initial encounter      NEW MEDICATIONS STARTED DURING THIS VISIT:  New Prescriptions   MELOXICAM (MOBIC) 15 MG TABLET    Take 1 tablet (15 mg total) by mouth daily.     Note:  This document was prepared using Dragon voice recognition software and may include unintentional dictation errors.    Versie Starks, PA-C 11/16/17 [place], [place], MD 11/16/17 2102

## 2017-11-16 NOTE — ED Triage Notes (Signed)
States she dropped a table across the top of right foot  Pain is mainly across the toes

## 2017-11-16 NOTE — ED Notes (Signed)
First RN note:  Patient had a side table fall on the right foot.  No deformity noted to the foot at this time.

## 2017-12-07 ENCOUNTER — Observation Stay
Admission: EM | Admit: 2017-12-07 | Discharge: 2017-12-08 | Disposition: A | Discharge status: Home / Self Care | Payer: Medicare HMO | Attending: Internal Medicine | Admitting: Internal Medicine

## 2017-12-07 ENCOUNTER — Emergency Department: Payer: Medicare HMO

## 2017-12-07 DIAGNOSIS — R079 Chest pain, unspecified: Secondary | ICD-10-CM | POA: Diagnosis present

## 2017-12-07 DIAGNOSIS — E1122 Type 2 diabetes mellitus with diabetic chronic kidney disease: Secondary | ICD-10-CM | POA: Insufficient documentation

## 2017-12-07 DIAGNOSIS — Z881 Allergy status to other antibiotic agents status: Secondary | ICD-10-CM | POA: Insufficient documentation

## 2017-12-07 DIAGNOSIS — Z96653 Presence of artificial knee joint, bilateral: Secondary | ICD-10-CM | POA: Insufficient documentation

## 2017-12-07 DIAGNOSIS — Z951 Presence of aortocoronary bypass graft: Secondary | ICD-10-CM | POA: Insufficient documentation

## 2017-12-07 DIAGNOSIS — E1140 Type 2 diabetes mellitus with diabetic neuropathy, unspecified: Secondary | ICD-10-CM | POA: Diagnosis not present

## 2017-12-07 DIAGNOSIS — E669 Obesity, unspecified: Secondary | ICD-10-CM | POA: Insufficient documentation

## 2017-12-07 DIAGNOSIS — R0789 Other chest pain: Secondary | ICD-10-CM | POA: Diagnosis not present

## 2017-12-07 DIAGNOSIS — Z8673 Personal history of transient ischemic attack (TIA), and cerebral infarction without residual deficits: Secondary | ICD-10-CM | POA: Insufficient documentation

## 2017-12-07 DIAGNOSIS — N186 End stage renal disease: Secondary | ICD-10-CM | POA: Insufficient documentation

## 2017-12-07 DIAGNOSIS — Z882 Allergy status to sulfonamides status: Secondary | ICD-10-CM | POA: Insufficient documentation

## 2017-12-07 DIAGNOSIS — E1151 Type 2 diabetes mellitus with diabetic peripheral angiopathy without gangrene: Secondary | ICD-10-CM | POA: Insufficient documentation

## 2017-12-07 DIAGNOSIS — N2581 Secondary hyperparathyroidism of renal origin: Secondary | ICD-10-CM | POA: Insufficient documentation

## 2017-12-07 DIAGNOSIS — Z6838 Body mass index (BMI) 38.0-38.9, adult: Secondary | ICD-10-CM | POA: Insufficient documentation

## 2017-12-07 DIAGNOSIS — Z794 Long term (current) use of insulin: Secondary | ICD-10-CM | POA: Diagnosis not present

## 2017-12-07 DIAGNOSIS — E7800 Pure hypercholesterolemia, unspecified: Secondary | ICD-10-CM | POA: Diagnosis not present

## 2017-12-07 DIAGNOSIS — F329 Major depressive disorder, single episode, unspecified: Secondary | ICD-10-CM | POA: Diagnosis not present

## 2017-12-07 DIAGNOSIS — D631 Anemia in chronic kidney disease: Secondary | ICD-10-CM | POA: Insufficient documentation

## 2017-12-07 DIAGNOSIS — J449 Chronic obstructive pulmonary disease, unspecified: Secondary | ICD-10-CM | POA: Insufficient documentation

## 2017-12-07 DIAGNOSIS — I252 Old myocardial infarction: Secondary | ICD-10-CM | POA: Insufficient documentation

## 2017-12-07 DIAGNOSIS — Z992 Dependence on renal dialysis: Secondary | ICD-10-CM | POA: Insufficient documentation

## 2017-12-07 DIAGNOSIS — Z955 Presence of coronary angioplasty implant and graft: Secondary | ICD-10-CM | POA: Insufficient documentation

## 2017-12-07 DIAGNOSIS — F17210 Nicotine dependence, cigarettes, uncomplicated: Secondary | ICD-10-CM | POA: Insufficient documentation

## 2017-12-07 DIAGNOSIS — I517 Cardiomegaly: Secondary | ICD-10-CM | POA: Insufficient documentation

## 2017-12-07 DIAGNOSIS — Z888 Allergy status to other drugs, medicaments and biological substances status: Secondary | ICD-10-CM | POA: Insufficient documentation

## 2017-12-07 DIAGNOSIS — E782 Mixed hyperlipidemia: Secondary | ICD-10-CM | POA: Insufficient documentation

## 2017-12-07 DIAGNOSIS — Z8249 Family history of ischemic heart disease and other diseases of the circulatory system: Secondary | ICD-10-CM | POA: Insufficient documentation

## 2017-12-07 DIAGNOSIS — Z7982 Long term (current) use of aspirin: Secondary | ICD-10-CM | POA: Insufficient documentation

## 2017-12-07 DIAGNOSIS — I120 Hypertensive chronic kidney disease with stage 5 chronic kidney disease or end stage renal disease: Secondary | ICD-10-CM | POA: Diagnosis not present

## 2017-12-07 DIAGNOSIS — I209 Angina pectoris, unspecified: Secondary | ICD-10-CM | POA: Diagnosis present

## 2017-12-07 DIAGNOSIS — Z79899 Other long term (current) drug therapy: Secondary | ICD-10-CM | POA: Insufficient documentation

## 2017-12-07 DIAGNOSIS — Z86718 Personal history of other venous thrombosis and embolism: Secondary | ICD-10-CM | POA: Insufficient documentation

## 2017-12-07 LAB — CBC
HEMATOCRIT: 27.6 % — AB (ref 35.0–47.0)
Hemoglobin: 8.9 g/dL — ABNORMAL LOW (ref 12.0–16.0)
MCH: 31.1 pg (ref 26.0–34.0)
MCHC: 32.3 g/dL (ref 32.0–36.0)
MCV: 96.5 fL (ref 80.0–100.0)
Platelets: 170 10*3/uL (ref 150–440)
RBC: 2.87 MIL/uL — ABNORMAL LOW (ref 3.80–5.20)
RDW: 16.5 % — AB (ref 11.5–14.5)
WBC: 8.2 10*3/uL (ref 3.6–11.0)

## 2017-12-07 MED ORDER — IPRATROPIUM-ALBUTEROL 0.5-2.5 (3) MG/3ML IN SOLN
3.0000 mL | Freq: Once | RESPIRATORY_TRACT | Status: AC
  Administered 2017-12-07: 3 mL via RESPIRATORY_TRACT
  Filled 2017-12-07: qty 3

## 2017-12-07 MED ORDER — ASPIRIN 81 MG PO CHEW
324.0000 mg | CHEWABLE_TABLET | Freq: Once | ORAL | Status: AC
  Administered 2017-12-07: 324 mg via ORAL
  Filled 2017-12-07: qty 4

## 2017-12-07 NOTE — ED Triage Notes (Signed)
Pt arrives via ems from home with complaints of respiratory distress and chest pain. EMS reports pt took 3 doses of spray nitro back to back at home prior to calling EMS. Ems administered albuterol treatment in route to facility. Pt now denies any chest pain but states she currently has a headache. Pt states she is not having difficulty breathing at this time.

## 2017-12-07 NOTE — ED Provider Notes (Signed)
[facility] Emergency Department Provider Note ___________________   First MD Initiated Contact with Patient 12/07/17 2308     (approximate)  I have reviewed the triage vital signs and the nursing notes.   HISTORY  Chief Complaint Chest Pain    HPI Kimberly Shaffer is a 67 y.o. femalewith below list of chronic medical conditions including CAD with previous myocardial infarction DVTs COPD presents to the emergency department with acute onset of left-sided nonradiating chest pain that began 45 minutes before arrival.  Patient also admits to progressive dyspnea.  Patient states that she took 3 nitroglycerin sprays at home without any improvement.  On EMS arrival patient actively wheezing.  Patient states that she was unable to use her breathing treatments today.  EMS administered an albuterol breathing treatment patient states improvement in dyspnea however chest pain has persisted with her current pain score 5 out of 10.   Past Medical History:  Diagnosis Date  . Asthma   . CAD (coronary artery disease)   . Chronic lower back pain   . COPD (chronic obstructive pulmonary disease) ([place])   . Depression   . Diabetes mellitus without complication (HCC)    NIDD  . H/O angina pectoris   . H/O blood clots   . Hypercholesteremia   . Hypertension   . Left rotator cuff tear   . Myocardial infarction ([place])   . Obesity   . Renal insufficiency   . Stroke ([place])   . Vertigo, aural     Patient Active Problem List   Diagnosis Date Noted  . Abdominal pain 06/28/2017  . Bilateral carotid artery stenosis 03/08/2017  . ESRD needing dialysis ([place]) 03/08/2017  . Non-ST elevation myocardial infarction (NSTEMI), subendocardial infarction, subsequent episode of care ([place]) 11/04/2016  . Onychogryphosis 10/31/2016  . Onychomycosis 10/31/2016  . Subungual exostosis 10/31/2016  . Type 2 diabetes mellitus with diabetic neuropathy ([place]) 10/31/2016  . Chronic osteomyelitis of  left hand ([place]) 09/03/2016  . Left arm swelling 08/30/2016  . Pre-operative clearance 08/30/2016  . Coronary artery disease of native artery of native heart with stable angina pectoris ([place]) 08/03/2016  . Benign paroxysmal positional vertigo due to bilateral vestibular disorder 07/26/2016  . H/O: CVA (cerebrovascular accident) 07/26/2016  . Stable angina ([place]) 07/05/2016  . Sepsis due to pneumonia ([place]) 07/05/2016  . Fracture of left foot 04/26/2016  . Pre-transplant evaluation for kidney transplant 02/04/2016  . Chest pain at rest 12/17/2015  . Chest pain 12/17/2015  . DJD of shoulder 10/21/2015  . Perianal lesion 05/20/2015  . 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 Lumbar radiculopathy 05/14/2015  . 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 Benign essential hypertension 04/22/2015  . Grief 03/18/2015  . Lumbosacral facet joint syndrome 02/16/2015  . Greater trochanteric bursitis 02/16/2015  . DDD (degenerative disc disease), lumbosacral 01/20/2015  . 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 Osteoarthritis 01/20/2015  . IDA (iron deficiency anemia) 03/25/2014  . Thyroid nodule 01/15/2014  . Lung nodule 08/28/2013  . Tobacco abuse 05/17/2013  . Mixed hyperlipidemia 06/14/2011  . OSA on CPAP 06/14/2011  . PAD (peripheral artery disease) ([place]) 06/14/2011  . Depression 05/05/2011  . Chronic kidney disease (CKD), stage V ([place]) 03/26/2011  . Multiple vessel coronary artery disease 03/26/2011  . Obesity, unspecified 03/26/2011  . Type 2 diabetes mellitus ([place]) 03/26/2011    Past Surgical History:  Procedure Laterality Date  . A/V FISTULAGRAM Left 03/14/2017   Procedure: A/V  Fistulagram;  Surgeon: Katha Cabal, MD;  Location: [facility];  Service: Cardiovascular;  Laterality: Left;  . AV FISTULA PLACEMENT Left 11-04-2015  . AV FISTULA PLACEMENT Left 11/04/2015   Procedure: ARTERIOVENOUS (AV) FISTULA CREATION  ( BRACHIAL  CEPHALIC );  Surgeon: Katha Cabal, MD;  Location: [facility];  Service: Vascular;  Laterality: Left;  . CARDIAC CATHETERIZATION    . CARDIAC CATHETERIZATION Left 07/19/2016   Procedure: Left Heart Cath and Coronary Angiography;  Surgeon: Corey Skains, MD;  Location: [facility];  Service: Cardiovascular;  Laterality: Left;  . CORONARY ARTERY BYPASS GRAFT  2012  . CORONARY STENT PLACEMENT    . JOINT REPLACEMENT    . KNEE SURGERY Bilateral   . PERIPHERAL VASCULAR CATHETERIZATION N/A 11/13/2015   Procedure: Dialysis/Perma Catheter Insertion;  Surgeon: Katha Cabal, MD;  Location: [facility];  Service: Cardiovascular;  Laterality: N/A;  . PERIPHERAL VASCULAR CATHETERIZATION N/A 12/04/2015   Procedure: Dialysis/Perma Catheter Insertion;  Surgeon: Katha Cabal, MD;  Location: [facility];  Service: Cardiovascular;  Laterality: N/A;  . PERIPHERAL VASCULAR CATHETERIZATION Left 12/31/2015   Procedure: A/V Shuntogram/Fistulagram;  Surgeon: Algernon Huxley, MD;  Location: [facility] [facility];  Service: Cardiovascular;  Laterality: Left;  . PERIPHERAL VASCULAR CATHETERIZATION N/A 12/31/2015   Procedure: A/V Shunt Intervention;  Surgeon: Algernon Huxley, MD;  Location: [facility];  Service: Cardiovascular;  Laterality: N/A;  . PERIPHERAL VASCULAR CATHETERIZATION Left 02/25/2016   Procedure: A/V Shuntogram/Fistulagram;  Surgeon: Algernon Huxley, MD;  Location: [place] [facility];  Service: Cardiovascular;  Laterality: Left;  . PERIPHERAL VASCULAR CATHETERIZATION N/A 03/18/2016   Procedure: Dialysis/Perma Catheter Removal;  Surgeon: Katha Cabal, MD;  Location: [place] [facility];  Service: Cardiovascular;  Laterality: N/A;  . REMOVAL OF A DIALYSIS CATHETER N/A 06/30/2017   Procedure: REMOVAL OF A DIALYSIS CATHETER;  Surgeon: Katha Cabal, MD;  Location: [facility];  Service: Vascular;  Laterality: N/A;  . REPLACEMENT TOTAL KNEE BILATERAL Bilateral      Prior to Admission medications   Medication Sig Start Date End Date Taking? Authorizing Provider  albuterol (PROVENTIL HFA;VENTOLIN HFA) 108 (90 BASE) MCG/ACT inhaler Inhale into the lungs every 4 (four) hours as needed for wheezing or shortness of breath. 2-4 puffs    [provider]  amLODipine (NORVASC) 10 MG tablet Take 10 mg by mouth daily.    [provider]  aspirin EC 81 MG tablet Take 81 mg by mouth daily.    [provider]  calcium carbonate (OSCAL) 1500 (600 Ca) MG TABS tablet Take 600 mg of elemental calcium by mouth daily with breakfast.    [provider]  folic acid (FOLVITE) 1 MG tablet Take 1 mg by mouth daily.    [provider]  gabapentin (NEURONTIN) 100 MG capsule Take 100 mg by mouth every morning.    [provider]  HYDROcodone-acetaminophen (NORCO/VICODIN) 5-325 MG tablet Take 1 tablet by mouth every 6 (six) hours as needed for moderate pain. 07/01/17 07/01/18  Loletha Grayer, MD  ipratropium (ATROVENT) 0.02 % nebulizer solution Take 2.5 mLs (0.5 mg total) by nebulization every 6 (six) hours as needed for wheezing or shortness of breath. 01/19/16   Laverle Hobby, MD  levocetirizine (XYZAL) 5 MG tablet Take 5 mg by mouth daily.    [provider]  lubiprostone (AMITIZA) 24 MCG capsule Take 24 mcg by mouth daily as needed for constipation.  [provider]  meclizine (ANTIVERT) 12.5 MG tablet Take 1 tablet (12.5 mg total) by mouth 3 (three) times daily as needed for dizziness or nausea. 08/27/16   Daymon Larsen, MD  meloxicam (MOBIC) 15 MG tablet Take 1 tablet (15 mg total) by mouth daily. 11/16/17 11/16/18  Fisher, Linden Dolin, PA-C  methocarbamol (ROBAXIN) 500 MG tablet Take 500-750 mg by mouth 2 (two) times daily as needed. For muscle spasms 02/28/17   [provider]  metoprolol succinate (TOPROL-XL) 100 MG 24 hr tablet Take 100 mg by mouth daily. Take with or immediately following a  meal.    [provider]  nicotine (NICODERM CQ - DOSED IN MG/24 HOURS) 21 mg/24hr patch Place 1 patch (21 mg total) onto the skin daily. 07/01/17   Loletha Grayer, MD  nitroGLYCERIN (NITROLINGUAL) 0.4 MG/SPRAY spray Place 1 spray under the tongue every 5 (five) minutes x 3 doses as needed for chest pain.    [provider]  pantoprazole (PROTONIX) 40 MG tablet Take 40 mg by mouth 2 (two) times daily.    [provider]  Pramoxine-Dimethicone 1-6 % CREA Place 1 application rectally 2 (two) times daily as needed (itching, burning, irritation, or other rectal discomfort caused by hemorrhoids).     [provider]  RENVELA 2.4 g PACK Take 2.4 g by mouth 2 (two) times daily.    [provider]  rosuvastatin (CRESTOR) 20 MG tablet Take 20 mg by mouth daily.    [provider]  SENSIPAR 30 MG tablet Take 30 mg by mouth daily. 07/01/16   [provider]  SPIRIVA HANDIHALER 18 MCG inhalation capsule Place 1 capsule into inhaler and inhale daily.    [provider]  SYMBICORT 160-4.5 MCG/ACT inhaler Inhale 2 puffs into the lungs 2 (two) times daily.    [provider]  vancomycin (VANCOCIN) 1-5 GM/200ML-% SOLN 100omg iv with each dialysis for six more sessions 07/01/17   Loletha Grayer, MD  venlafaxine XR (EFFEXOR-XR) 75 MG 24 hr capsule Take 75 mg by mouth daily.    [provider]  VITAMIN E PO Take 1 capsule by mouth daily.    [provider]    Allergies Nystatin; Sulfa antibiotics; and Niaspan [niacin]  Family History  Problem Relation Age of Onset  . Cancer Mother   . Cancer Father   . Diabetes Brother   . Heart disease Brother     Social History Social History   Tobacco Use  . Smoking status: Current Every Day Smoker    Packs/day: 0.25    Types: Cigarettes  . Smokeless tobacco: Never Used  Substance Use Topics  . Alcohol use: No    Alcohol/week: 0.0 oz  . Drug use: No    Review  of Systems Constitutional: No fever/chills Eyes: No visual changes. ENT: No sore throat. Cardiovascular: Positive for chest pain. Respiratory: Positive for shortness of breath. Gastrointestinal: No abdominal pain.  No nausea, no vomiting.  No diarrhea.  No constipation. Genitourinary: Negative for dysuria. Musculoskeletal: Negative for neck pain.  Negative for back pain. Integumentary: Negative for rash. Neurological: Negative for headaches, focal weakness or numbness.   ____________________________________________   PHYSICAL EXAM:  VITAL SIGNS: ED Triage Vitals  Enc Vitals Group     BP      Pulse      Resp      Temp      Temp src      SpO2  Weight      Height      Head Circumference      Peak Flow      Pain Score      Pain Loc      Pain Edu?      Excl. in [place]?     Constitutional: Alert and oriented. Well appearing and in no acute distress. Eyes: Conjunctivae are normal.  Head: Atraumatic. Mouth/Throat: Mucous membranes are moist. Oropharynx non-erythematous. Neck: No stridor.   Cardiovascular: Normal rate, regular rhythm. Good peripheral circulation. Grossly normal heart sounds. Respiratory: Tachypnea, diffuse rhonchi/wheezes Gastrointestinal: Soft and nontender. No distention.  Musculoskeletal: No lower extremity tenderness nor edema. No gross deformities of extremities. Neurologic:  Normal speech and language. No gross focal neurologic deficits are appreciated.  Skin:  Skin is warm, dry and intact. No rash noted. Psychiatric: Mood and affect are normal. Speech and behavior are normal.  ____________________________________________   LABS (all labs ordered are listed, but only abnormal results are displayed)  Labs Reviewed  BASIC METABOLIC PANEL - Abnormal; Notable for the following components:      Result Value   Glucose, Bld 128 (*)    BUN 66 (*)    Creatinine, Ser 8.41 (*)    Calcium 8.5 (*)    GFR calc non Af Amer 4 (*)    GFR calc Af Amer 5 (*)     All other components within normal limits  CBC - Abnormal; Notable for the following components:   RBC 2.87 (*)    Hemoglobin 8.9 (*)    HCT 27.6 (*)    RDW 16.5 (*)    All other components within normal limits  TROPONIN I - Abnormal; Notable for the following components:   Troponin I 0.04 (*)    All other components within normal limits   ____________________________________________  EKG  ED ECG REPORT I, [place] Jasmyne Lodato, the attending physician, personally viewed and interpreted this ECG.   Date: 12/08/2017  EKG Time: 11:13 PM  Rate: 74  Rhythm: Normal sinus rhythm  Axis: Normal  Intervals: Normal  ST&T Change: None  ____________________________________________  RADIOLOGY I, [place] Tarren Velardi, personally viewed and evaluated these images (plain radiographs) as part of my medical decision making, as well as reviewing the written report by the radiologist.  ED MD interpretation: Cardiomegaly with peribronchial cuffing  Official radiology report(s): Dg Chest Port 1 View  Result Date: 12/07/2017 CLINICAL DATA:  Shortness of breath.  Chest pain. EXAM: PORTABLE CHEST 1 VIEW COMPARISON:  08/27/2016 FINDINGS: Again seen cardiomegaly, unchanged mediastinal contours. There is central peribronchial thickening suspicious for pulmonary edema. No confluent airspace disease. No large pleural effusion. No pneumothorax. IMPRESSION: Cardiomegaly. Peribronchial cuffing suspicious for pulmonary edema, less likely bronchial inflammation/bronchitis. Electronically Signed   By: Jeb Levering M.D.   On: 12/07/2017 23:48     Procedures   ____________________________________________   INITIAL IMPRESSION / ASSESSMENT AND PLAN / ED COURSE  As part of my medical decision making, I reviewed the following data within the electronic MEDICAL RECORD NUMBER   67 year old female presented with above-stated history and physical exam secondary to left-sided chest pain is radiating to the left arm.   EKG revealed no evidence of infarction.  Laboratory data notable for a troponin of 0.04 hemoglobin 8.9 which is a decrease from 10.2 on 06/29/2017.  Patient took 2 sprays of Nitro spray before arrival to the emergency department.  Patient given 3 and 24 mg of aspirin Nitropaste applied to the patient's chest  IV morphine administered.  Patient with ongoing chest pain at this time and as such patient discussed with Dr. Jannifer Franklin for hospital admission further evaluation and management concern for cardiac etiology chest pain.    ____________________________________________  FINAL CLINICAL IMPRESSION(S) / ED DIAGNOSES  Final diagnoses:  Chest pain, unspecified type     MEDICATIONS GIVEN DURING THIS VISIT:  Medications  nitroGLYCERIN (NITROGLYN) 2 % ointment 0.5 inch (has no administration in time range)  ipratropium-albuterol (DUONEB) 0.5-2.5 (3) MG/3ML nebulizer solution 3 mL (3 mLs Nebulization Given 12/07/17 2325)  ipratropium-albuterol (DUONEB) 0.5-2.5 (3) MG/3ML nebulizer solution 3 mL (3 mLs Nebulization Given 12/07/17 2326)  aspirin chewable tablet 324 mg (324 mg Oral Given 12/07/17 2329)     ED Discharge Orders    None       Note:  This document was prepared using Dragon voice recognition software and may include unintentional dictation errors.    Gregor Hams, MD 12/08/17 [phone]

## 2017-12-08 ENCOUNTER — Other Ambulatory Visit: Payer: Self-pay

## 2017-12-08 DIAGNOSIS — R0789 Other chest pain: Secondary | ICD-10-CM | POA: Diagnosis not present

## 2017-12-08 LAB — BASIC METABOLIC PANEL
ANION GAP: 12 (ref 5–15)
BUN: 66 mg/dL — AB (ref 6–20)
CO2: 22 mmol/L (ref 22–32)
Calcium: 8.5 mg/dL — ABNORMAL LOW (ref 8.9–10.3)
Chloride: 103 mmol/L (ref 101–111)
Creatinine, Ser: 8.41 mg/dL — ABNORMAL HIGH (ref 0.44–1.00)
GFR calc Af Amer: 5 mL/min — ABNORMAL LOW (ref >60)
GFR calc non Af Amer: 4 mL/min — ABNORMAL LOW (ref >60)
GLUCOSE: 128 mg/dL — AB (ref 65–99)
POTASSIUM: 4.5 mmol/L (ref 3.5–5.1)
Sodium: 137 mmol/L (ref 135–145)

## 2017-12-08 LAB — TROPONIN I
Troponin I: 0.04 ng/mL (ref <0.03)
Troponin I: 0.04 ng/mL (ref <0.03)
Troponin I: 0.05 ng/mL (ref <0.03)
Troponin I: 0.05 ng/mL (ref <0.03)

## 2017-12-08 LAB — TSH: TSH: 0.657 u[IU]/mL (ref 0.350–4.500)

## 2017-12-08 LAB — BRAIN NATRIURETIC PEPTIDE: B Natriuretic Peptide: 810 pg/mL — ABNORMAL HIGH (ref 0.0–100.0)

## 2017-12-08 LAB — MRSA PCR SCREENING: MRSA by PCR: NEGATIVE

## 2017-12-08 MED ORDER — ASPIRIN EC 81 MG PO TBEC
81.0000 mg | DELAYED_RELEASE_TABLET | Freq: Every day | ORAL | Status: DC
  Administered 2017-12-08: 81 mg via ORAL
  Filled 2017-12-08: qty 1

## 2017-12-08 MED ORDER — SEVELAMER CARBONATE 2.4 G PO PACK
2.4000 g | PACK | Freq: Two times a day (BID) | ORAL | Status: DC
  Administered 2017-12-08: 2.4 g via ORAL
  Filled 2017-12-08 (×2): qty 1

## 2017-12-08 MED ORDER — NEPRO/CARBSTEADY PO LIQD: 237.0000 mL | Freq: Two times a day (BID) | ORAL | Status: DC

## 2017-12-08 MED ORDER — VENLAFAXINE HCL ER 75 MG PO CP24
75.0000 mg | ORAL_CAPSULE | Freq: Every day | ORAL | Status: DC
  Administered 2017-12-08: 75 mg via ORAL
  Filled 2017-12-08: qty 1

## 2017-12-08 MED ORDER — ROSUVASTATIN CALCIUM 10 MG PO TABS: 20.0000 mg | ORAL_TABLET | Freq: Every day | ORAL | Status: DC

## 2017-12-08 MED ORDER — GABAPENTIN 100 MG PO CAPS: 100.0000 mg | ORAL_CAPSULE | Freq: Every morning | ORAL | Status: DC

## 2017-12-08 MED ORDER — HEPARIN SODIUM (PORCINE) 5000 UNIT/ML IJ SOLN
5000.0000 [IU] | Freq: Three times a day (TID) | INTRAMUSCULAR | Status: DC
  Filled 2017-12-08: qty 1

## 2017-12-08 MED ORDER — PANTOPRAZOLE SODIUM 40 MG PO TBEC
40.0000 mg | DELAYED_RELEASE_TABLET | Freq: Every day | ORAL | Status: DC
  Administered 2017-12-08: 40 mg via ORAL
  Filled 2017-12-08: qty 1

## 2017-12-08 MED ORDER — CINACALCET HCL 30 MG PO TABS
30.0000 mg | ORAL_TABLET | Freq: Every day | ORAL | Status: DC
  Administered 2017-12-08: 30 mg via ORAL
  Filled 2017-12-08: qty 1

## 2017-12-08 MED ORDER — ACETAMINOPHEN 650 MG RE SUPP: 650.0000 mg | Freq: Four times a day (QID) | RECTAL | Status: DC | PRN

## 2017-12-08 MED ORDER — MOMETASONE FURO-FORMOTEROL FUM 200-5 MCG/ACT IN AERO
2.0000 | INHALATION_SPRAY | Freq: Two times a day (BID) | RESPIRATORY_TRACT | Status: DC
  Administered 2017-12-08: 2 via RESPIRATORY_TRACT
  Filled 2017-12-08: qty 8.8

## 2017-12-08 MED ORDER — FOLIC ACID 1 MG PO TABS: 1.0000 mg | ORAL_TABLET | Freq: Every day | ORAL | Status: DC

## 2017-12-08 MED ORDER — ALBUTEROL SULFATE (2.5 MG/3ML) 0.083% IN NEBU: 2.5000 mg | INHALATION_SOLUTION | RESPIRATORY_TRACT | Status: DC | PRN

## 2017-12-08 MED ORDER — NITROGLYCERIN 2 % TD OINT
0.5000 [in_us] | TOPICAL_OINTMENT | Freq: Once | TRANSDERMAL | Status: AC
  Administered 2017-12-08: 0.5 [in_us] via TOPICAL
  Filled 2017-12-08: qty 1

## 2017-12-08 MED ORDER — TIOTROPIUM BROMIDE MONOHYDRATE 18 MCG IN CAPS
1.0000 | ORAL_CAPSULE | Freq: Every day | RESPIRATORY_TRACT | Status: DC
  Administered 2017-12-08: 18 ug via RESPIRATORY_TRACT
  Filled 2017-12-08: qty 5

## 2017-12-08 MED ORDER — METOPROLOL SUCCINATE ER 100 MG PO TB24
100.0000 mg | ORAL_TABLET | Freq: Every day | ORAL | Status: DC
  Administered 2017-12-08: 100 mg via ORAL
  Filled 2017-12-08: qty 1

## 2017-12-08 MED ORDER — ONDANSETRON HCL 4 MG PO TABS: 4.0000 mg | ORAL_TABLET | Freq: Four times a day (QID) | ORAL | Status: DC | PRN

## 2017-12-08 MED ORDER — NEPRO/CARBSTEADY PO LIQD: 237.0000 mL | Freq: Two times a day (BID) | ORAL | 0 refills | Status: DC

## 2017-12-08 MED ORDER — ACETAMINOPHEN 325 MG PO TABS
650.0000 mg | ORAL_TABLET | Freq: Four times a day (QID) | ORAL | Status: DC | PRN
  Administered 2017-12-08: 650 mg via ORAL
  Filled 2017-12-08: qty 2

## 2017-12-08 MED ORDER — HYDROCODONE-ACETAMINOPHEN 5-325 MG PO TABS: 1.0000 | ORAL_TABLET | Freq: Four times a day (QID) | ORAL | Status: DC | PRN

## 2017-12-08 MED ORDER — DOCUSATE SODIUM 100 MG PO CAPS
100.0000 mg | ORAL_CAPSULE | Freq: Two times a day (BID) | ORAL | Status: DC
  Administered 2017-12-08: 100 mg via ORAL
  Filled 2017-12-08: qty 1

## 2017-12-08 MED ORDER — RENA-VITE PO TABS
1.0000 | ORAL_TABLET | Freq: Every day | ORAL | Status: DC
  Filled 2017-12-08 (×2): qty 1

## 2017-12-08 MED ORDER — AMLODIPINE BESYLATE 10 MG PO TABS
10.0000 mg | ORAL_TABLET | Freq: Every day | ORAL | Status: DC
  Administered 2017-12-08: 10 mg via ORAL
  Filled 2017-12-08: qty 1

## 2017-12-08 MED ORDER — ONDANSETRON HCL 4 MG/2ML IJ SOLN: 4.0000 mg | Freq: Four times a day (QID) | INTRAMUSCULAR | Status: DC | PRN

## 2017-12-08 NOTE — Discharge Summary (Signed)
[name] NAME: Kimberly Shaffer    MR#:  [id number]  DATE OF BIRTH:  Aug 31, 1951  DATE OF ADMISSION:  12/07/2017 ADMITTING PHYSICIAN: Harrie Foreman, MD  DATE OF DISCHARGE: **12/08/2017  PRIMARY CARE PHYSICIAN: [place], [facility]    ADMISSION DIAGNOSIS:  Chest pain, unspecified type [R07.9]  DISCHARGE DIAGNOSIS:  Chest pain suspected muscular pain ESRD on HD HTN SECONDARY DIAGNOSIS:   Past Medical History:  Diagnosis Date  . Asthma   . CAD (coronary artery disease)   . Chronic lower back pain   . COPD (chronic obstructive pulmonary disease) ([place])   . Depression   . Diabetes mellitus without complication (HCC)    NIDD  . H/O angina pectoris   . H/O blood clots   . Hypercholesteremia   . Hypertension   . Left rotator cuff tear   . Myocardial infarction ([place])   . Obesity   . Renal insufficiency   . Stroke ([place])   . Vertigo, aural     HOSPITAL COURSE:   67 year old female admitted for chest pain. 1.  Chest pain: Atypical   Also continue aspirin -appears muscular since pain on palpation -feels better now. No more pain 2.  COPD: Shortness of breath may have contributed to chest pain.  She is breathing more comfortably now.  Continue inhaled corticosteroid and Spiriva.  Albuterol as needed. -no wheezing 3.  Hypertension: Controlled; continue metoprolol and amlodipine  4.  ESRD: Continue Sensipar and Levemir.  Consult nephrology for continuation of dialysis 5.  Depression: Continue Effexor 6.  DVT prophylaxis: Heparin  Pt will get dialyzed today. If remains stable d/c home later Pt agreeable CONSULTS OBTAINED:  Treatment Team:  Yolonda Kida, MD Murlean Iba, MD  DRUG ALLERGIES:   Allergies  Allergen Reactions  . Nystatin Hives and Itching  . Sulfa Antibiotics Swelling, Hives and Rash  . Niaspan [Niacin] Other (See Comments) and Hives    DISCHARGE MEDICATIONS:   Allergies as of  12/08/2017      Reactions   Nystatin Hives, Itching   Sulfa Antibiotics Swelling, Hives, Rash   Niaspan [niacin] Other (See Comments), Hives      Medication List    STOP taking these medications   Pramoxine-Dimethicone 1-6 % Crea   vancomycin 1-5 GM/200ML-% Soln Commonly known as:  VANCOCIN     TAKE these medications   albuterol 108 (90 Base) MCG/ACT inhaler Commonly known as:  PROVENTIL HFA;VENTOLIN HFA Inhale into the lungs every 4 (four) hours as needed for wheezing or shortness of breath. 2-4 puffs   amLODipine 10 MG tablet Commonly known as:  NORVASC Take 10 mg by mouth daily.   aspirin EC 81 MG tablet Take 81 mg by mouth daily.   calcium carbonate 1500 (600 Ca) MG Tabs tablet Commonly known as:  OSCAL Take 600 mg of elemental calcium by mouth daily with breakfast.   feeding supplement (NEPRO CARB STEADY) Liqd Take 237 mLs by mouth 2 (two) times daily between meals.   folic acid 1 MG tablet Commonly known as:  FOLVITE Take 1 mg by mouth daily.   gabapentin 100 MG capsule Commonly known as:  NEURONTIN Take 100 mg by mouth every morning.   HYDROcodone-acetaminophen 5-325 MG tablet Commonly known as:  NORCO/VICODIN Take 1 tablet by mouth every 6 (six) hours as needed for moderate pain.   ipratropium 0.02 % nebulizer solution Commonly known as:  ATROVENT Take 2.5 mLs (0.5 mg  total) by nebulization every 6 (six) hours as needed for wheezing or shortness of breath.   levocetirizine 5 MG tablet Commonly known as:  XYZAL Take 5 mg by mouth daily.   lubiprostone 24 MCG capsule Commonly known as:  AMITIZA Take 24 mcg by mouth daily as needed for constipation.   meclizine 12.5 MG tablet Commonly known as:  ANTIVERT Take 1 tablet (12.5 mg total) by mouth 3 (three) times daily as needed for dizziness or nausea.   meloxicam 15 MG tablet Commonly known as:  MOBIC Take 1 tablet (15 mg total) by mouth daily.   methocarbamol 500 MG tablet Commonly known as:   ROBAXIN Take 500-750 mg by mouth 2 (two) times daily as needed. For muscle spasms   metoprolol succinate 100 MG 24 hr tablet Commonly known as:  TOPROL-XL Take 100 mg by mouth daily. Take with or immediately following a meal.   nicotine 21 mg/24hr patch Commonly known as:  NICODERM CQ - dosed in mg/24 hours Place 1 patch (21 mg total) onto the skin daily.   nitroGLYCERIN 0.4 MG/SPRAY spray Commonly known as:  NITROLINGUAL Place 1 spray under the tongue every 5 (five) minutes x 3 doses as needed for chest pain.   pantoprazole 40 MG tablet Commonly known as:  PROTONIX Take 40 mg by mouth 2 (two) times daily.   RENVELA 2.4 g Pack Generic drug:  sevelamer carbonate Take 2.4 g by mouth 2 (two) times daily.   rosuvastatin 20 MG tablet Commonly known as:  CRESTOR Take 20 mg by mouth daily.   SENSIPAR 30 MG tablet Generic drug:  cinacalcet Take 30 mg by mouth daily.   SPIRIVA HANDIHALER 18 MCG inhalation capsule Generic drug:  tiotropium Place 1 capsule into inhaler and inhale daily.   SYMBICORT 160-4.5 MCG/ACT inhaler Generic drug:  budesonide-formoterol Inhale 2 puffs into the lungs 2 (two) times daily.   venlafaxine XR 75 MG 24 hr capsule Commonly known as:  EFFEXOR-XR Take 75 mg by mouth daily.   VITAMIN E PO Take 1 capsule by mouth daily.       If you experience worsening of your admission symptoms, develop shortness of breath, life threatening emergency, suicidal or homicidal thoughts you must seek medical attention immediately by calling 911 or calling your MD immediately  if symptoms less severe.  You Must read complete instructions/literature along with all the possible adverse reactions/side effects for all the Medicines you take and that have been prescribed to you. Take any new Medicines after you have completely understood and accept all the possible adverse reactions/side effects.   Please note  You were cared for by a hospitalist during your hospital  stay. If you have any questions about your discharge medications or the care you received while you were in the hospital after you are discharged, you can call the unit and asked to speak with the hospitalist on call if the hospitalist that took care of you is not available. Once you are discharged, your primary care physician will handle any further medical issues. Please note that NO REFILLS for any discharge medications will be authorized once you are discharged, as it is imperative that you return to your primary care physician (or establish a relationship with a primary care physician if you do not have one) for your aftercare needs so that they can reassess your need for medications and monitor your lab values. Today   SUBJECTIVE   Feels better today. No more CP  VITAL SIGNS:  Blood pressure (!) 131/95, pulse 70, temperature 98 F (36.7 C), temperature source Oral, resp. rate 20, height 5\' 6"  (1.676 m), weight 107.1 kg (236 lb 1.8 oz), SpO2 97 %.  I/O:  No intake or output data in the 24 hours ending 12/08/17 1654  PHYSICAL EXAMINATION:  GENERAL:  67 y.o.-year-old patient lying in the bed with no acute distress.  EYES: Pupils equal, round, reactive to light and accommodation. No scleral icterus. Extraocular muscles intact.  HEENT: Head atraumatic, normocephalic. Oropharynx and nasopharynx clear.  NECK:  Supple, no jugular venous distention. No thyroid enlargement, no tenderness.  LUNGS: Normal breath sounds bilaterally, no wheezing, rales,rhonchi or crepitation. No use of accessory muscles of respiration.  CARDIOVASCULAR: S1, S2 normal. No murmurs, rubs, or gallops.  ABDOMEN: Soft, non-tender, non-distended. Bowel sounds present. No organomegaly or mass.  EXTREMITIES: No pedal edema, cyanosis, or clubbing.  NEUROLOGIC: Cranial nerves II through XII are intact. Muscle strength 5/5 in all extremities. Sensation intact. Gait not checked.  PSYCHIATRIC: The patient is alert and oriented x 3.   SKIN: No obvious rash, lesion, or ulcer.   DATA REVIEW:   CBC  Recent Labs  Lab 12/07/17 2342  WBC 8.2  HGB 8.9*  HCT 27.6*  PLT 170    Chemistries  Recent Labs  Lab 12/07/17 2342  NA 137  K 4.5  CL 103  CO2 22  GLUCOSE 128*  BUN 66*  CREATININE 8.41*  CALCIUM 8.5*    Microbiology Results   Recent Results (from the past 240 hour(s))  MRSA PCR Screening     Status: None   Collection Time: 12/08/17  4:07 AM  Result Value Ref Range Status   MRSA by PCR NEGATIVE NEGATIVE Final    Comment:        The GeneXpert MRSA Assay (FDA approved for NASAL specimens only), is one component of a comprehensive MRSA colonization surveillance program. It is not intended to diagnose MRSA infection nor to guide or monitor treatment for MRSA infections. Performed at [facility], [address] [place], [place] [address]     RADIOLOGY:  Dg Chest Port 1 View  Result Date: 12/07/2017 CLINICAL DATA:  Shortness of breath.  Chest pain. EXAM: PORTABLE CHEST 1 VIEW COMPARISON:  08/27/2016 FINDINGS: Again seen cardiomegaly, unchanged mediastinal contours. There is central peribronchial thickening suspicious for pulmonary edema. No confluent airspace disease. No large pleural effusion. No pneumothorax. IMPRESSION: Cardiomegaly. Peribronchial cuffing suspicious for pulmonary edema, less likely bronchial inflammation/bronchitis. Electronically Signed   By: Jeb Levering M.D.   On: 12/07/2017 23:48     Management plans discussed with the patient, family and they are in agreement.  CODE STATUS:     Code Status Orders  (From admission, onward)        Start     Ordered   12/08/17 0358  Full code  Continuous     12/08/17 0357    Code Status History    Date Active Date Inactive Code Status Order ID Comments User Context   06/28/2017 0749 07/01/2017 2218 Full Code [id number]  Harrie Foreman, MD Inpatient   07/19/2016 0857 07/19/2016 1319 Full Code [id number]   Corey Skains, MD Inpatient   07/05/2016 2236 07/09/2016 1950 Full Code [id number]  Harvie Bridge, DO Inpatient   12/17/2015 8101 12/20/2015 1957 Full Code [id number]  Saundra Shelling, MD Inpatient      TOTAL TIME TAKING CARE OF THIS PATIENT: *40* minutes.    Fritzi Mandes M.D on 12/08/2017 at  4:54 PM  Between 7am to 6pm - Pager - [phone] After 6pm go to [email or web address] - password [email or web address] [facility]  Office  [phone]  CC: Primary care physician; [facility]

## 2017-12-08 NOTE — Progress Notes (Signed)
Post dialysis assessment 

## 2017-12-08 NOTE — Care Management Obs Status (Signed)
[place] NOTIFICATION   Patient Details  Name: DAWNYEL LEVEN MRN: [id number] Date of Birth: [birth year]   Medicare Observation Status Notification Given:  Yes    Beverly Sessions, RN 12/08/2017, 10:35 AM

## 2017-12-08 NOTE — Progress Notes (Signed)
[facility], [place] 12/08/17  Subjective:   Patient known to our practice from outpatient dialysis She reports that she has been having left sided chest pain for the past several days.  She it comes and goes.  It is worse in certain positions.  She does also report dyspnea on exertion especially while climbing stairs or walking to the mailbox.  She denies any nausea or vomiting.  No leg edema.  No shortness of breath at present  Objective:  Vital signs in last 24 hours:  Temp:  [97.9 F (36.6 C)-98.1 F (36.7 C)] 98 F (36.7 C) (03/22 0753) Pulse Rate:  [63-78] 64 (03/22 0754) Resp:  [16-21] 20 (03/22 0753) BP: (129-150)/(54-84) 131/54 (03/22 0753) SpO2:  [90 %-98 %] 97 % (03/22 0754) Weight:  [103 kg (227 lb)] 103 kg (227 lb) (03/22 0500)  Weight change:  Filed Weights   12/07/17 2319 12/08/17 0500  Weight: 103 kg (227 lb) 103 kg (227 lb)    Intake/Output:   No intake or output data in the 24 hours ending 12/08/17 1357   Physical Exam: General:  Well-appearing, sitting in the bed  HEENT  anicteric, moist oral mucous membranes  Neck  supple  Pulm/lungs  normal breathing effort, mild diffuse wheezing  CVS/Heart  regular rhythm, soft systolic murmur  Abdomen:   Soft, nontender  Extremities:  No peripheral edema  Neurologic:  Alert, oriented  Skin:  No acute rashes  Access:  AV graft       Basic Metabolic Panel:  Recent Labs  Lab 12/07/17 2342  NA 137  K 4.5  CL 103  CO2 22  GLUCOSE 128*  BUN 66*  CREATININE 8.41*  CALCIUM 8.5*     CBC: Recent Labs  Lab 12/07/17 2342  WBC 8.2  HGB 8.9*  HCT 27.6*  MCV 96.5  PLT 170      Lab Results  Component Value Date   HEPBSAG Negative 06/28/2017      Microbiology:  Recent Results (from the past 240 hour(s))  MRSA PCR Screening     Status: None   Collection Time: 12/08/17  4:07 AM  Result Value Ref Range Status   MRSA by PCR NEGATIVE NEGATIVE Final    Comment:         The GeneXpert MRSA Assay (FDA approved for NASAL specimens only), is one component of a comprehensive MRSA colonization surveillance program. It is not intended to diagnose MRSA infection nor to guide or monitor treatment for MRSA infections. Performed at [facility], [address] [place], [place] [address]     Coagulation Studies: No results for input(s): LABPROT, INR in the last 72 hours.  Urinalysis: No results for input(s): COLORURINE, LABSPEC, PHURINE, GLUCOSEU, HGBUR, BILIRUBINUR, KETONESUR, PROTEINUR, UROBILINOGEN, NITRITE, LEUKOCYTESUR in the last 72 hours.  Invalid input(s): APPERANCEUR    Imaging: Dg Chest Port 1 View  Result Date: 12/07/2017 CLINICAL DATA:  Shortness of breath.  Chest pain. EXAM: PORTABLE CHEST 1 VIEW COMPARISON:  08/27/2016 FINDINGS: Again seen cardiomegaly, unchanged mediastinal contours. There is central peribronchial thickening suspicious for pulmonary edema. No confluent airspace disease. No large pleural effusion. No pneumothorax. IMPRESSION: Cardiomegaly. Peribronchial cuffing suspicious for pulmonary edema, less likely bronchial inflammation/bronchitis. Electronically Signed   By: Jeb Levering M.D.   On: 12/07/2017 23:48     Medications:    . amLODipine  10 mg Oral Daily  . aspirin EC  81 mg Oral Daily  . cinacalcet  30 mg Oral  Daily  . docusate sodium  100 mg Oral BID  . [START ON 03/18/5283] folic acid  1 mg Oral Daily  . [START ON 12/09/2017] gabapentin  100 mg Oral q morning - 10a  . heparin  5,000 Units Subcutaneous Q8H  . metoprolol succinate  100 mg Oral Daily  . mometasone-formoterol  2 puff Inhalation BID  . pantoprazole  40 mg Oral Daily  . rosuvastatin  20 mg Oral q1800  . sevelamer carbonate  2.4 g Oral BID  . tiotropium  1 capsule Inhalation Daily  . venlafaxine XR  75 mg Oral Daily   acetaminophen **OR** acetaminophen, albuterol, HYDROcodone-acetaminophen, ondansetron **OR** ondansetron (ZOFRAN)  IV  Assessment/ Plan:  67 y.o. African-American female with coronary artery disease, hypertension, CABG, peripheral vascular disease, diabetes type 2, hyperlipidemia, bilateral total knee replacement, PD catheter removal June 30, 2017, ESRD  [facility] [facility]/TTS  1.  ESRD, HD 2.  Anemia of chronic kidney disease 3.  Secondary hyperparathyroidism  Patient presented for chest pain which sounds musculoskeletal We will try ibuprofen Patient missed her dialysis on Thursday because of her son's medical appointments Hemodialysis today Patient to resume regular treatments starting tomorrow    LOS: 0 Aubrielle Stroud Candiss Norse 3/22/20191:57 PM  [place], [place]  Note: This note was prepared with Dragon dictation. Any transcription errors are unintentional

## 2017-12-08 NOTE — Progress Notes (Signed)
This note also relates to the following rows which could not be included: Pulse Rate - Cannot attach notes to unvalidated device data Resp - Cannot attach notes to unvalidated device data BP - Cannot attach notes to unvalidated device data  Hd completed  

## 2017-12-08 NOTE — Progress Notes (Addendum)
Initial Nutrition Assessment  DOCUMENTATION CODES:   Obesity unspecified  INTERVENTION:  Chopped meats Nepro Shake po BID, each supplement provides 425 kcal and 19 grams protein Rena-vite   NUTRITION DIAGNOSIS:   Increased nutrient needs related to chronic illness as evidenced by estimated needs.   GOAL:   Patient will meet greater than or equal to 90% of their needs   MONITOR:   PO intake, Supplement acceptance, I & O's, Labs, Weight trends, Skin  REASON FOR ASSESSMENT:   Malnutrition Screening Tool    ASSESSMENT:   67 y.o. F admitted w/ chest pain CAD, DVT's, COPD, ESRD.   Medications: Colace, heparin, sensipar, dulera, spiriva, renvela, effexor, folic acid  Labs: troponin 0.05 (H)   3/21: BG 128 (H), BUN 66 (H), creatinine 8.41 (H), GFR 4 (L), RBC 2.87 (L), hemoglobin 8.9 (L), hct 27.6 (L), Ca 8.5 (L)  According to chart pt has been weight stable since October 2017. Pt reports she needs dentures and is in the process of getting them, but currently has some trouble chewing. She reports decreased appetite when she goes to eat, but still feels hungry and taste changes. She reports that she at for dinner recently a half of a steak, pork chops, and 2 pieces of chicken legs with a salad.   Pt on HD dialysis starting around 2016-2017.   NUTRITION - FOCUSED PHYSICAL EXAM:    Most Recent Value  Orbital Region  No depletion  Upper Arm Region  No depletion  Thoracic and Lumbar Region  No depletion  Buccal Region  No depletion  Temple Region  No depletion  Clavicle Bone Region  No depletion  Clavicle and Acromion Bone Region  No depletion  Scapular Bone Region  Unable to assess  Dorsal Hand  No depletion  Patellar Region  No depletion  Anterior Thigh Region  No depletion  Posterior Calf Region  No depletion  Edema (RD Assessment)  None  Hair  Reviewed  Eyes  Reviewed  Mouth  Reviewed  Skin  Reviewed  Nails  Reviewed       Diet Order:  Diet renal with fluid  restriction Fluid restriction: 1200 mL Fluid; Room service appropriate? Yes; Fluid consistency: Thin  EDUCATION NEEDS:   Education needs have been addressed  Skin:  Skin Assessment: Reviewed RN Assessment  Last BM:  unknown  Height:   Ht Readings from Last 1 Encounters:  12/07/17 5\' 6"  (1.676 m)    Weight:   Wt Readings from Last 1 Encounters:  12/08/17 236 lb 1.8 oz (107.1 kg)   UBW: 227-230 lbs % UBW: 100%  Ideal Body Weight:  59 kg  BMI:  Body mass index is 38.11 kg/m.  Estimated Nutritional Needs:   Kcal:  1800 - 2100 kcal  Protein:  88 - 100 grams   Fluid:  > 1.8 L    Hope Budds, Dietetic Intern

## 2017-12-08 NOTE — Progress Notes (Signed)
Patient's daughter called a taxi for patient. Discharge instructions discussed with patient and teach-back method used to make sure patient understood. IV and tele monitor removed; patient's belongings in bag. Patient transported out to taxi via wheelchair. Earleen Reaper, RN

## 2017-12-08 NOTE — Progress Notes (Signed)
Patient returned from HD prior to change of shift. Bedside report received from [facility]. Patient has been attempting to get in touch with her daughter to come pick her up, but has not heard back. Patient says she's going to try calling a friend to see if they can pick her up instead. Will go over discharge instructions once patient's ride is on the way. Earleen Reaper, RN

## 2017-12-08 NOTE — H&P (Signed)
Kimberly Shaffer is an 67 y.o. female.   Chief Complaint: Chest pain HPI: The patient with past medical history of end-stage renal disease on dialysis, history of myocardial infarction, hypertension, diabetes and COPD presents to the emergency department with chest pressure and shortness of breath.  The patient received multiple breathing treatments which improved her respiratory distress but her chest pain persisted.  It was centralized pressure in character and did not radiate.  The patient denies nausea, vomiting or diaphoresis.  Her pain began this evening while she was in bed.  She did not have any chest pain today during dialysis.  Nitroglycerin ointment was placed on her chest prior to the emergency department staff calling the hospitalist service for further evaluation.  Past Medical History:  Diagnosis Date  . Asthma   . CAD (coronary artery disease)   . Chronic lower back pain   . COPD (chronic obstructive pulmonary disease) ([place])   . Depression   . Diabetes mellitus without complication (HCC)    NIDD  . H/O angina pectoris   . H/O blood clots   . Hypercholesteremia   . Hypertension   . Left rotator cuff tear   . Myocardial infarction ([place])   . Obesity   . Renal insufficiency   . Stroke ([place])   . Vertigo, aural     Past Surgical History:  Procedure Laterality Date  . A/V FISTULAGRAM Left 03/14/2017   Procedure: A/V Fistulagram;  Surgeon: Katha Cabal, MD;  Location: [facility];  Service: Cardiovascular;  Laterality: Left;  . AV FISTULA PLACEMENT Left 11-04-2015  . AV FISTULA PLACEMENT Left 11/04/2015   Procedure: ARTERIOVENOUS (AV) FISTULA CREATION  ( BRACHIAL CEPHALIC );  Surgeon: Katha Cabal, MD;  Location: [facility];  Service: Vascular;  Laterality: Left;  . CARDIAC CATHETERIZATION    . CARDIAC CATHETERIZATION Left 07/19/2016   Procedure: Left Heart Cath and Coronary Angiography;  Surgeon: Corey Skains, MD;  Location: [facility];   Service: Cardiovascular;  Laterality: Left;  . CORONARY ARTERY BYPASS GRAFT  2012  . CORONARY STENT PLACEMENT    . JOINT REPLACEMENT    . KNEE SURGERY Bilateral   . PERIPHERAL VASCULAR CATHETERIZATION N/A 11/13/2015   Procedure: Dialysis/Perma Catheter Insertion;  Surgeon: Katha Cabal, MD;  Location: [facility];  Service: Cardiovascular;  Laterality: N/A;  . PERIPHERAL VASCULAR CATHETERIZATION N/A 12/04/2015   Procedure: Dialysis/Perma Catheter Insertion;  Surgeon: Katha Cabal, MD;  Location: [facility];  Service: Cardiovascular;  Laterality: N/A;  . PERIPHERAL VASCULAR CATHETERIZATION Left 12/31/2015   Procedure: A/V Shuntogram/Fistulagram;  Surgeon: Algernon Huxley, MD;  Location: [place] [facility];  Service: Cardiovascular;  Laterality: Left;  . PERIPHERAL VASCULAR CATHETERIZATION N/A 12/31/2015   Procedure: A/V Shunt Intervention;  Surgeon: Algernon Huxley, MD;  Location: [facility];  Service: Cardiovascular;  Laterality: N/A;  . PERIPHERAL VASCULAR CATHETERIZATION Left 02/25/2016   Procedure: A/V Shuntogram/Fistulagram;  Surgeon: Algernon Huxley, MD;  Location: [facility];  Service: Cardiovascular;  Laterality: Left;  . PERIPHERAL VASCULAR CATHETERIZATION N/A 03/18/2016   Procedure: Dialysis/Perma Catheter Removal;  Surgeon: Katha Cabal, MD;  Location: [facility];  Service: Cardiovascular;  Laterality: N/A;  . REMOVAL OF A DIALYSIS CATHETER N/A 06/30/2017   Procedure: REMOVAL OF A DIALYSIS CATHETER;  Surgeon: Katha Cabal, MD;  Location: [facility];  Service: Vascular;  Laterality: N/A;  . REPLACEMENT TOTAL KNEE BILATERAL Bilateral  Family History  Problem Relation Age of Onset  . Cancer Mother   . Cancer Father   . Diabetes Brother   . Heart disease Brother    Social History:  reports that she has been smoking cigarettes.  She has been smoking about 0.25 packs per day. She has never used smokeless tobacco. She reports that  she does not drink alcohol or use drugs.  Allergies:  Allergies  Allergen Reactions  . Nystatin Hives and Itching  . Sulfa Antibiotics Swelling, Hives and Rash  . Niaspan [Niacin] Other (See Comments) and Hives    Medications Prior to Admission  Medication Sig Dispense Refill  . albuterol (PROVENTIL HFA;VENTOLIN HFA) 108 (90 BASE) MCG/ACT inhaler Inhale into the lungs every 4 (four) hours as needed for wheezing or shortness of breath. 2-4 puffs    . amLODipine (NORVASC) 10 MG tablet Take 10 mg by mouth daily.    Marland Kitchen aspirin EC 81 MG tablet Take 81 mg by mouth daily.    . calcium carbonate (OSCAL) 1500 (600 Ca) MG TABS tablet Take 600 mg of elemental calcium by mouth daily with breakfast.    . folic acid (FOLVITE) 1 MG tablet Take 1 mg by mouth daily.    Marland Kitchen gabapentin (NEURONTIN) 100 MG capsule Take 100 mg by mouth every morning.    Marland Kitchen HYDROcodone-acetaminophen (NORCO/VICODIN) 5-325 MG tablet Take 1 tablet by mouth every 6 (six) hours as needed for moderate pain. 12 tablet 0  . ipratropium (ATROVENT) 0.02 % nebulizer solution Take 2.5 mLs (0.5 mg total) by nebulization every 6 (six) hours as needed for wheezing or shortness of breath. 125 mL 5  . levocetirizine (XYZAL) 5 MG tablet Take 5 mg by mouth daily.    Marland Kitchen lubiprostone (AMITIZA) 24 MCG capsule Take 24 mcg by mouth daily as needed for constipation.    . meclizine (ANTIVERT) 12.5 MG tablet Take 1 tablet (12.5 mg total) by mouth 3 (three) times daily as needed for dizziness or nausea. 30 tablet 1  . meloxicam (MOBIC) 15 MG tablet Take 1 tablet (15 mg total) by mouth daily. 30 tablet 2  . methocarbamol (ROBAXIN) 500 MG tablet Take 500-750 mg by mouth 2 (two) times daily as needed. For muscle spasms    . metoprolol succinate (TOPROL-XL) 100 MG 24 hr tablet Take 100 mg by mouth daily. Take with or immediately following a meal.    . nicotine (NICODERM CQ - DOSED IN MG/24 HOURS) 21 mg/24hr patch Place 1 patch (21 mg total) onto the skin daily. 28  patch 0  . nitroGLYCERIN (NITROLINGUAL) 0.4 MG/SPRAY spray Place 1 spray under the tongue every 5 (five) minutes x 3 doses as needed for chest pain.    . pantoprazole (PROTONIX) 40 MG tablet Take 40 mg by mouth 2 (two) times daily.    . Pramoxine-Dimethicone 1-6 % CREA Place 1 application rectally 2 (two) times daily as needed (itching, burning, irritation, or other rectal discomfort caused by hemorrhoids).     . RENVELA 2.4 g PACK Take 2.4 g by mouth 2 (two) times daily.    . rosuvastatin (CRESTOR) 20 MG tablet Take 20 mg by mouth daily.    . SENSIPAR 30 MG tablet Take 30 mg by mouth daily.    Marland Kitchen SPIRIVA HANDIHALER 18 MCG inhalation capsule Place 1 capsule into inhaler and inhale daily.    . SYMBICORT 160-4.5 MCG/ACT inhaler Inhale 2 puffs into the lungs 2 (two) times daily.    . vancomycin (VANCOCIN) 1-5  GM/200ML-% SOLN 100omg iv with each dialysis for six more sessions 4000 mL   . venlafaxine XR (EFFEXOR-XR) 75 MG 24 hr capsule Take 75 mg by mouth daily.    Marland Kitchen VITAMIN E PO Take 1 capsule by mouth daily.      Results for orders placed or performed during the hospital encounter of 12/07/17 (from the past 48 hour(s))  Basic metabolic panel     Status: Abnormal   Collection Time: 12/07/17 11:42 PM  Result Value Ref Range   Sodium 137 135 - 145 mmol/L   Potassium 4.5 3.5 - 5.1 mmol/L   Chloride 103 101 - 111 mmol/L   CO2 22 22 - 32 mmol/L   Glucose, Bld 128 (H) 65 - 99 mg/dL   BUN 66 (H) 6 - 20 mg/dL   Creatinine, Ser 8.41 (H) 0.44 - 1.00 mg/dL   Calcium 8.5 (L) 8.9 - 10.3 mg/dL   GFR calc non Af Amer 4 (L) >60 mL/min   GFR calc Af Amer 5 (L) >60 mL/min    Comment: (NOTE) The eGFR has been calculated using the CKD EPI equation. This calculation has not been validated in all clinical situations. eGFR's persistently <60 mL/min signify possible Chronic Kidney Disease.    Anion gap 12 5 - 15    Comment: Performed at [facility], [address] [place], [place] [address]  CBC      Status: Abnormal   Collection Time: 12/07/17 11:42 PM  Result Value Ref Range   WBC 8.2 3.6 - 11.0 K/uL   RBC 2.87 (L) 3.80 - 5.20 MIL/uL   Hemoglobin 8.9 (L) 12.0 - 16.0 g/dL   HCT 27.6 (L) 35.0 - 47.0 %   MCV 96.5 80.0 - 100.0 fL   MCH 31.1 26.0 - 34.0 pg   MCHC 32.3 32.0 - 36.0 g/dL   RDW 16.5 (H) 11.5 - 14.5 %   Platelets 170 150 - 440 K/uL    Comment: Performed at [facility], [address] [place], [place] [address]  Troponin I     Status: Abnormal   Collection Time: 12/07/17 11:42 PM  Result Value Ref Range   Troponin I 0.04 (HH) <0.03 ng/mL    Comment: CRITICAL RESULT CALLED TO, READ BACK BY AND VERIFIED WITH LAURIE LEMONS RN AT 7121 12/08/17. MSS Performed at [facility], [address] [place], [place] [address]   MRSA PCR Screening     Status: None   Collection Time: 12/08/17  4:07 AM  Result Value Ref Range   MRSA by PCR NEGATIVE NEGATIVE    Comment:        The GeneXpert MRSA Assay (FDA approved for NASAL specimens only), is one component of a comprehensive MRSA colonization surveillance program. It is not intended to diagnose MRSA infection nor to guide or monitor treatment for MRSA infections. Performed at [facility], [address] [place], [place] [address]   TSH     Status: None   Collection Time: 12/08/17  4:25 AM  Result Value Ref Range   TSH 0.657 0.350 - 4.500 uIU/mL    Comment: Performed by a 3rd Generation assay with a functional sensitivity of <=0.01 uIU/mL. Performed at [facility], [address] [place], [place] [address]   Troponin I     Status: Abnormal   Collection Time: 12/08/17  4:25 AM  Result Value Ref Range   Troponin I 0.04 (HH) <0.03 ng/mL    Comment: CRITICAL VALUE NOTED.  VALUE IS CONSISTENT WITH PREVIOUSLY REPORTED/CALLED VALUE. QSD Performed at [facility], [address] [place], [place] [address]    Dg Chest [place] 1 View  Result Date: 12/07/2017 CLINICAL DATA:   Shortness of breath.  Chest pain. EXAM: PORTABLE CHEST 1 VIEW COMPARISON:  08/27/2016 FINDINGS: Again seen cardiomegaly, unchanged mediastinal contours. There is central peribronchial thickening suspicious for pulmonary edema. No confluent airspace disease. No large pleural effusion. No pneumothorax. IMPRESSION: Cardiomegaly. Peribronchial cuffing suspicious for pulmonary edema, less likely bronchial inflammation/bronchitis. Electronically Signed   By: Jeb Levering M.D.   On: 12/07/2017 23:48    Review of Systems  Constitutional: Negative for chills and fever.  HENT: Negative for sore throat and tinnitus.   Eyes: Negative for blurred vision and redness.  Respiratory: Negative for cough and shortness of breath.   Cardiovascular: Positive for chest pain. Negative for palpitations, orthopnea and PND.  Gastrointestinal: Negative for abdominal pain, diarrhea, nausea and vomiting.  Genitourinary: Negative for dysuria, frequency and urgency.  Musculoskeletal: Negative for joint pain and myalgias.  Skin: Negative for rash.       No lesions  Neurological: Negative for speech change, focal weakness and weakness.  Endo/Heme/Allergies: Does not bruise/bleed easily.       No temperature intolerance  Psychiatric/Behavioral: Negative for depression and suicidal ideas.    Blood pressure (!) 131/54, pulse 64, temperature 98 F (36.7 C), temperature source Oral, resp. rate 20, height _0  (1.676 m), weight 103 kg (227 lb), SpO2 97 %. Physical Exam  Vitals reviewed. Constitutional: She is oriented to person, place, and time. She appears well-developed and well-nourished.  HENT:  Head: Normocephalic and atraumatic.  Mouth/Throat: Oropharynx is clear and moist.  Eyes: Pupils are equal, round, and reactive to light. Conjunctivae and EOM are normal. No scleral icterus.  Neck: Normal range of motion. Neck supple. No JVD present. No tracheal deviation present. No thyromegaly present.  Cardiovascular:  Normal rate, regular rhythm and normal heart sounds. Exam reveals no gallop and no friction rub.  No murmur heard. Respiratory: Effort normal and breath sounds normal.  GI: Soft. Bowel sounds are normal. She exhibits no distension. There is no tenderness.  Genitourinary:  Genitourinary Comments: Deferred  Musculoskeletal: Normal range of motion. She exhibits no edema.  Lymphadenopathy:    She has no cervical adenopathy.  Neurological: She is alert and oriented to person, place, and time. No cranial nerve deficit. She exhibits normal muscle tone.  Skin: Skin is warm and dry. No rash noted. No erythema.  Psychiatric: She has a normal mood and affect. Her behavior is normal. Judgment and thought content normal.     Assessment/Plan This is a 67 year old female admitted for chest pain. 1.  Chest pain: Atypical in duration although coronary artery disease makes actual ischemia more suspicious.  Consult cardiology.  Continue to follow cardiac biomarkers.  Also continue aspirin 2.  COPD: Shortness of breath may have contributed to chest pain.  She is breathing more comfortably now.  Continue inhaled corticosteroid and Spiriva.  Albuterol as needed. 3.  Hypertension: Controlled; continue metoprolol and amlodipine  4.  ESRD: Continue Sensipar and Levemir.  Consult nephrology for continuation of dialysis 5.  Depression: Continue Effexor 6.  DVT prophylaxis: Heparin 7.  GI prophylaxis: None The patient is a full code.  Time spent on admission orders and patient care approximately 45 minutes  Harrie Foreman, MD 12/08/2017, 8:27 AM

## 2017-12-08 NOTE — Progress Notes (Signed)
Hd started  

## 2017-12-22 ENCOUNTER — Encounter: Payer: Self-pay | Admitting: Emergency Medicine

## 2017-12-22 ENCOUNTER — Observation Stay
Admission: EM | Admit: 2017-12-22 | Discharge: 2017-12-25 | Disposition: A | Discharge status: Home / Self Care | Payer: Medicare HMO | Attending: Internal Medicine | Admitting: Internal Medicine

## 2017-12-22 ENCOUNTER — Other Ambulatory Visit: Payer: Self-pay

## 2017-12-22 ENCOUNTER — Emergency Department: Payer: Medicare HMO

## 2017-12-22 DIAGNOSIS — J449 Chronic obstructive pulmonary disease, unspecified: Secondary | ICD-10-CM | POA: Diagnosis not present

## 2017-12-22 DIAGNOSIS — G4733 Obstructive sleep apnea (adult) (pediatric): Secondary | ICD-10-CM | POA: Insufficient documentation

## 2017-12-22 DIAGNOSIS — I209 Angina pectoris, unspecified: Secondary | ICD-10-CM | POA: Diagnosis present

## 2017-12-22 DIAGNOSIS — E1151 Type 2 diabetes mellitus with diabetic peripheral angiopathy without gangrene: Secondary | ICD-10-CM | POA: Diagnosis not present

## 2017-12-22 DIAGNOSIS — M545 Low back pain: Secondary | ICD-10-CM | POA: Diagnosis not present

## 2017-12-22 DIAGNOSIS — Z6836 Body mass index (BMI) 36.0-36.9, adult: Secondary | ICD-10-CM | POA: Insufficient documentation

## 2017-12-22 DIAGNOSIS — N186 End stage renal disease: Secondary | ICD-10-CM | POA: Insufficient documentation

## 2017-12-22 DIAGNOSIS — F329 Major depressive disorder, single episode, unspecified: Secondary | ICD-10-CM | POA: Diagnosis not present

## 2017-12-22 DIAGNOSIS — E1122 Type 2 diabetes mellitus with diabetic chronic kidney disease: Secondary | ICD-10-CM | POA: Insufficient documentation

## 2017-12-22 DIAGNOSIS — Z7982 Long term (current) use of aspirin: Secondary | ICD-10-CM | POA: Diagnosis not present

## 2017-12-22 DIAGNOSIS — Z955 Presence of coronary angioplasty implant and graft: Secondary | ICD-10-CM | POA: Insufficient documentation

## 2017-12-22 DIAGNOSIS — I6523 Occlusion and stenosis of bilateral carotid arteries: Secondary | ICD-10-CM | POA: Diagnosis not present

## 2017-12-22 DIAGNOSIS — I252 Old myocardial infarction: Secondary | ICD-10-CM | POA: Insufficient documentation

## 2017-12-22 DIAGNOSIS — I120 Hypertensive chronic kidney disease with stage 5 chronic kidney disease or end stage renal disease: Secondary | ICD-10-CM | POA: Insufficient documentation

## 2017-12-22 DIAGNOSIS — R079 Chest pain, unspecified: Secondary | ICD-10-CM

## 2017-12-22 DIAGNOSIS — E1140 Type 2 diabetes mellitus with diabetic neuropathy, unspecified: Secondary | ICD-10-CM | POA: Insufficient documentation

## 2017-12-22 DIAGNOSIS — Z992 Dependence on renal dialysis: Secondary | ICD-10-CM | POA: Diagnosis not present

## 2017-12-22 DIAGNOSIS — I2582 Chronic total occlusion of coronary artery: Secondary | ICD-10-CM | POA: Diagnosis not present

## 2017-12-22 DIAGNOSIS — E669 Obesity, unspecified: Secondary | ICD-10-CM | POA: Insufficient documentation

## 2017-12-22 DIAGNOSIS — E782 Mixed hyperlipidemia: Secondary | ICD-10-CM | POA: Insufficient documentation

## 2017-12-22 DIAGNOSIS — I25728 Atherosclerosis of autologous artery coronary artery bypass graft(s) with other forms of angina pectoris: Secondary | ICD-10-CM | POA: Diagnosis not present

## 2017-12-22 DIAGNOSIS — G8929 Other chronic pain: Secondary | ICD-10-CM | POA: Insufficient documentation

## 2017-12-22 DIAGNOSIS — Z882 Allergy status to sulfonamides status: Secondary | ICD-10-CM | POA: Insufficient documentation

## 2017-12-22 DIAGNOSIS — Z8673 Personal history of transient ischemic attack (TIA), and cerebral infarction without residual deficits: Secondary | ICD-10-CM | POA: Insufficient documentation

## 2017-12-22 DIAGNOSIS — F17210 Nicotine dependence, cigarettes, uncomplicated: Secondary | ICD-10-CM | POA: Insufficient documentation

## 2017-12-22 DIAGNOSIS — Z7951 Long term (current) use of inhaled steroids: Secondary | ICD-10-CM | POA: Diagnosis not present

## 2017-12-22 LAB — BASIC METABOLIC PANEL
ANION GAP: 10 (ref 5–15)
BUN: 49 mg/dL — ABNORMAL HIGH (ref 6–20)
CHLORIDE: 103 mmol/L (ref 101–111)
CO2: 25 mmol/L (ref 22–32)
Calcium: 8.3 mg/dL — ABNORMAL LOW (ref 8.9–10.3)
Creatinine, Ser: 7.48 mg/dL — ABNORMAL HIGH (ref 0.44–1.00)
GFR calc Af Amer: 6 mL/min — ABNORMAL LOW (ref >60)
GFR calc non Af Amer: 5 mL/min — ABNORMAL LOW (ref >60)
GLUCOSE: 179 mg/dL — AB (ref 65–99)
POTASSIUM: 3.7 mmol/L (ref 3.5–5.1)
Sodium: 138 mmol/L (ref 135–145)

## 2017-12-22 LAB — CBC WITH DIFFERENTIAL/PLATELET
BASOS ABS: 0 10*3/uL (ref 0–0.1)
Basophils Relative: 1 %
Eosinophils Absolute: 0.2 10*3/uL (ref 0–0.7)
Eosinophils Relative: 2 %
HEMATOCRIT: 25.1 % — AB (ref 35.0–47.0)
Hemoglobin: 8 g/dL — ABNORMAL LOW (ref 12.0–16.0)
LYMPHS PCT: 21 %
Lymphs Abs: 1.6 10*3/uL (ref 1.0–3.6)
MCH: 30.9 pg (ref 26.0–34.0)
MCHC: 32 g/dL (ref 32.0–36.0)
MCV: 96.5 fL (ref 80.0–100.0)
Monocytes Absolute: 0.7 10*3/uL (ref 0.2–0.9)
Monocytes Relative: 10 %
NEUTROS ABS: 5.1 10*3/uL (ref 1.4–6.5)
Neutrophils Relative %: 66 %
Platelets: 193 10*3/uL (ref 150–440)
RBC: 2.6 MIL/uL — AB (ref 3.80–5.20)
RDW: 16.2 % — ABNORMAL HIGH (ref 11.5–14.5)
WBC: 7.6 10*3/uL (ref 3.6–11.0)

## 2017-12-22 LAB — TROPONIN I: Troponin I: 0.05 ng/mL (ref <0.03)

## 2017-12-22 NOTE — ED Triage Notes (Signed)
Pt arrives via [facility] from home with complaints of left-side chest pain that radiated to left side of neck to left arm. Pt reports taking two doses of three SL nitro sprays, which is how her cardiologist instructed her to take medication. Pt received 324 ASA in route from EMS. Pt does not wear oxygen at home but was placed on 2L O2 en route for exertional shortness of breath. Pt currently 98% on room air at this time.  Pt [facility] dialysis schedule. Did complete 4 hour dialysis treatment yesterday. Has no issues making appointments.

## 2017-12-22 NOTE — ED Provider Notes (Signed)
[facility] Emergency Department Provider Note   Time seen: 11:00 PM  I have reviewed the triage vital signs and the nursing notes.   HISTORY  Chief Complaint Chest Pain    HPI Kimberly Shaffer is a 67 y.o. female with below list of chronic medical conditions presents to the emergency department with acute onset of left side nonradiating chest pain with associated dyspnea.  Patient states that her blood pressure was also elevated.  Patient took 6 sprays of nitro before arrival to the emergency department with complete resolution of pain.  Patient is pain-free at this time.  Patient denies any lower extremity pain or swelling.  Patient states that she has a scheduled stress test with Dr. Nehemiah Massed on the 12th of this month.  Past Medical History:  Diagnosis Date  . Asthma   . CAD (coronary artery disease)   . Chronic lower back pain   . COPD (chronic obstructive pulmonary disease) ([place])   . Depression   . Diabetes mellitus without complication (HCC)    NIDD  . H/O angina pectoris   . H/O blood clots   . Hypercholesteremia   . Hypertension   . Left rotator cuff tear   . Myocardial infarction ([place])   . Obesity   . Renal insufficiency   . Stroke ([place])   . Vertigo, aural     Patient Active Problem List   Diagnosis Date Noted  . Abdominal pain 06/28/2017  . Bilateral carotid artery stenosis 03/08/2017  . ESRD needing dialysis ([place]) 03/08/2017  . Non-ST elevation myocardial infarction (NSTEMI), subendocardial infarction, subsequent episode of care ([place]) 11/04/2016  . Onychogryphosis 10/31/2016  . Onychomycosis 10/31/2016  . Subungual exostosis 10/31/2016  . Type 2 diabetes mellitus with diabetic neuropathy ([place]) 10/31/2016  . Chronic osteomyelitis of left hand ([place]) 09/03/2016  . Left arm swelling 08/30/2016  . Pre-operative clearance 08/30/2016  . Coronary artery disease of native artery of native heart with stable angina pectoris ([place]) 08/03/2016   . Benign paroxysmal positional vertigo due to bilateral vestibular disorder 07/26/2016  . H/O: CVA (cerebrovascular accident) 07/26/2016  . Stable angina ([place]) 07/05/2016  . Sepsis due to pneumonia ([place]) 07/05/2016  . Fracture of left foot 04/26/2016  . Pre-transplant evaluation for kidney transplant 02/04/2016  . Chest pain at rest 12/17/2015  . Chest pain 12/17/2015  . DJD of shoulder 10/21/2015  . Perianal lesion 05/20/2015  . 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 Lumbar radiculopathy 05/14/2015  . 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 Benign essential hypertension 04/22/2015  . Grief 03/18/2015  . Lumbosacral facet joint syndrome 02/16/2015  . Greater trochanteric bursitis 02/16/2015  . DDD (degenerative disc disease), lumbosacral 01/20/2015  . 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 Osteoarthritis 01/20/2015  . IDA (iron deficiency anemia) 03/25/2014  . Thyroid nodule 01/15/2014  . Lung nodule 08/28/2013  . Tobacco abuse 05/17/2013  . Mixed hyperlipidemia 06/14/2011  . OSA on CPAP 06/14/2011  . PAD (peripheral artery disease) ([place]) 06/14/2011  . Depression 05/05/2011  . Chronic kidney disease (CKD), stage V ([place]) 03/26/2011  . Multiple vessel coronary artery disease 03/26/2011  . Obesity, unspecified 03/26/2011  . Type 2 diabetes mellitus ([place]) 03/26/2011    Past Surgical History:  Procedure Laterality Date  . A/V FISTULAGRAM Left 03/14/2017   Procedure: A/V Fistulagram;  Surgeon: Katha Cabal, MD;  Location: [place] [facility];  Service: Cardiovascular;  Laterality: Left;  . AV FISTULA PLACEMENT  Left 11-04-2015  . AV FISTULA PLACEMENT Left 11/04/2015   Procedure: ARTERIOVENOUS (AV) FISTULA CREATION  ( BRACHIAL CEPHALIC );  Surgeon: Katha Cabal, MD;  Location: [facility];  Service: Vascular;  Laterality: Left;  . CARDIAC CATHETERIZATION    . CARDIAC CATHETERIZATION Left 07/19/2016   Procedure: Left Heart Cath  and Coronary Angiography;  Surgeon: Corey Skains, MD;  Location: [place] [facility];  Service: Cardiovascular;  Laterality: Left;  . CORONARY ARTERY BYPASS GRAFT  2012  . CORONARY STENT PLACEMENT    . JOINT REPLACEMENT    . KNEE SURGERY Bilateral   . PERIPHERAL VASCULAR CATHETERIZATION N/A 11/13/2015   Procedure: Dialysis/Perma Catheter Insertion;  Surgeon: Katha Cabal, MD;  Location: [place] [facility];  Service: Cardiovascular;  Laterality: N/A;  . PERIPHERAL VASCULAR CATHETERIZATION N/A 12/04/2015   Procedure: Dialysis/Perma Catheter Insertion;  Surgeon: Katha Cabal, MD;  Location: [place] [facility];  Service: Cardiovascular;  Laterality: N/A;  . PERIPHERAL VASCULAR CATHETERIZATION Left 12/31/2015   Procedure: A/V Shuntogram/Fistulagram;  Surgeon: Algernon Huxley, MD;  Location: [place] [facility];  Service: Cardiovascular;  Laterality: Left;  . PERIPHERAL VASCULAR CATHETERIZATION N/A 12/31/2015   Procedure: A/V Shunt Intervention;  Surgeon: Algernon Huxley, MD;  Location: [place] [facility];  Service: Cardiovascular;  Laterality: N/A;  . PERIPHERAL VASCULAR CATHETERIZATION Left 02/25/2016   Procedure: A/V Shuntogram/Fistulagram;  Surgeon: Algernon Huxley, MD;  Location: [place] [facility];  Service: Cardiovascular;  Laterality: Left;  . PERIPHERAL VASCULAR CATHETERIZATION N/A 03/18/2016   Procedure: Dialysis/Perma Catheter Removal;  Surgeon: Katha Cabal, MD;  Location: [place] [facility];  Service: Cardiovascular;  Laterality: N/A;  . REMOVAL OF A DIALYSIS CATHETER N/A 06/30/2017   Procedure: REMOVAL OF A DIALYSIS CATHETER;  Surgeon: Katha Cabal, MD;  Location: [facility];  Service: Vascular;  Laterality: N/A;  . REPLACEMENT TOTAL KNEE BILATERAL Bilateral     Prior to Admission medications   Medication Sig Start Date End Date Taking? Authorizing Provider  amLODipine (NORVASC) 10 MG tablet Take 10 mg by mouth daily.   Yes [provider]  metoprolol  succinate (TOPROL-XL) 100 MG 24 hr tablet Take 100 mg by mouth daily. Take with or immediately following a meal.   Yes [provider]  albuterol (PROVENTIL HFA;VENTOLIN HFA) 108 (90 BASE) MCG/ACT inhaler Inhale into the lungs every 4 (four) hours as needed for wheezing or shortness of breath. 2-4 puffs    [provider]  aspirin EC 81 MG tablet Take 81 mg by mouth daily.    [provider]  calcium carbonate (OSCAL) 1500 (600 Ca) MG TABS tablet Take 600 mg of elemental calcium by mouth daily with breakfast.    [provider]  folic acid (FOLVITE) 1 MG tablet Take 1 mg by mouth daily.    [provider]  gabapentin (NEURONTIN) 100 MG capsule Take 100 mg by mouth every morning.    [provider]  HYDROcodone-acetaminophen (NORCO/VICODIN) 5-325 MG tablet Take 1 tablet by mouth every 6 (six) hours as needed for moderate pain. 07/01/17 07/01/18  Loletha Grayer, MD  ipratropium (ATROVENT) 0.02 % nebulizer solution Take 2.5 mLs (0.5 mg total) by nebulization every 6 (six) hours as needed for wheezing or shortness of breath. 01/19/16   Laverle Hobby, MD  levocetirizine (XYZAL) 5 MG tablet Take 5 mg by mouth daily.    [provider]  lubiprostone (AMITIZA) 24 MCG capsule Take 24 mcg by mouth daily  as needed for constipation.    [provider]  meclizine (ANTIVERT) 12.5 MG tablet Take 1 tablet (12.5 mg total) by mouth 3 (three) times daily as needed for dizziness or nausea. 08/27/16   Daymon Larsen, MD  meloxicam (MOBIC) 15 MG tablet Take 1 tablet (15 mg total) by mouth daily. 11/16/17 11/16/18  Fisher, Linden Dolin, PA-C  methocarbamol (ROBAXIN) 500 MG tablet Take 500-750 mg by mouth 2 (two) times daily as needed. For muscle spasms 02/28/17   [provider]  nicotine (NICODERM CQ - DOSED IN MG/24 HOURS) 21 mg/24hr patch Place 1 patch (21 mg total) onto the skin daily. 07/01/17   Loletha Grayer, MD  nitroGLYCERIN  (NITROLINGUAL) 0.4 MG/SPRAY spray Place 1 spray under the tongue every 5 (five) minutes x 3 doses as needed for chest pain.    [provider]  Nutritional Supplements (FEEDING SUPPLEMENT, NEPRO CARB STEADY,) LIQD Take 237 mLs by mouth 2 (two) times daily between meals. 12/08/17   Fritzi Mandes, MD  pantoprazole (PROTONIX) 40 MG tablet Take 40 mg by mouth 2 (two) times daily.    [provider]  RENVELA 2.4 g PACK Take 2.4 g by mouth 2 (two) times daily.    [provider]  rosuvastatin (CRESTOR) 20 MG tablet Take 20 mg by mouth daily.    [provider]  SENSIPAR 30 MG tablet Take 30 mg by mouth daily. 07/01/16   [provider]  SPIRIVA HANDIHALER 18 MCG inhalation capsule Place 1 capsule into inhaler and inhale daily.    [provider]  SYMBICORT 160-4.5 MCG/ACT inhaler Inhale 2 puffs into the lungs 2 (two) times daily.    [provider]  venlafaxine XR (EFFEXOR-XR) 75 MG 24 hr capsule Take 75 mg by mouth daily.    [provider]  VITAMIN E PO Take 1 capsule by mouth daily.    [provider]    Allergies Nystatin; Sulfa antibiotics; and Niaspan [niacin]  Family History  Problem Relation Age of Onset  . Cancer Mother   . Cancer Father   . Diabetes Brother   . Heart disease Brother     Social History Social History   Tobacco Use  . Smoking status: Current Every Day Smoker    Packs/day: 0.25    Types: Cigarettes  . Smokeless tobacco: Never Used  Substance Use Topics  . Alcohol use: No    Alcohol/week: 0.0 oz  . Drug use: No    Review of Systems Constitutional: No fever/chills Eyes: No visual changes. ENT: No sore throat. Cardiovascular: Positive for chest pain now resolved Respiratory: Positive for dyspnea now resolved Gastrointestinal: No abdominal pain.  No nausea, no vomiting.  No diarrhea.  No constipation. Genitourinary: Negative for dysuria. Musculoskeletal: Negative for neck pain.   Negative for back pain. Integumentary: Negative for rash. Neurological: Negative for headaches, focal weakness or numbness.   ____________________________________________   PHYSICAL EXAM:  VITAL SIGNS: ED Triage Vitals  Enc Vitals Group     BP 12/22/17 2235 131/63     Pulse Rate 12/22/17 2235 83     Resp 12/22/17 2235 18     Temp 12/22/17 2235 98 F (36.7 C)     Temp Source 12/22/17 2235 Oral     SpO2 12/22/17 2235 97 %     Weight 12/22/17 2236 99.8 kg (220 lb)     Height 12/22/17 2236 1.676 m (5\' 6" )     Head Circumference --  Peak Flow --      Pain Score 12/22/17 2236 0     Pain Loc --      Pain Edu? --      Excl. in [place]? --      Constitutional: Alert and oriented. Well appearing and in no acute distress. Eyes: Conjunctivae are normal.  Head: Atraumatic. Mouth/Throat: Mucous membranes are moist.  Oropharynx non-erythematous. Neck: No stridor.   Cardiovascular: Normal rate, regular rhythm. Good peripheral circulation. Grossly normal heart sounds. Respiratory: Normal respiratory effort.  No retractions. Lungs CTAB. Gastrointestinal: Soft and nontender. No distention.  Musculoskeletal: No lower extremity tenderness nor edema. No gross deformities of extremities. Neurologic:  Normal speech and language. No gross focal neurologic deficits are appreciated.  Skin:  Skin is warm, dry and intact. No rash noted. Psychiatric: Mood and affect are normal. Speech and behavior are normal.  ____________________________________________   LABS (all labs ordered are listed, but only abnormal results are displayed)  Labs Reviewed  CBC WITH DIFFERENTIAL/PLATELET - Abnormal; Notable for the following components:      Result Value   RBC 2.60 (*)    Hemoglobin 8.0 (*)    HCT 25.1 (*)    RDW 16.2 (*)    All other components within normal limits  BASIC METABOLIC PANEL - Abnormal; Notable for the following components:   Glucose, Bld 179 (*)    BUN 49 (*)    Creatinine, Ser 7.48  (*)    Calcium 8.3 (*)    GFR calc non Af Amer 5 (*)    GFR calc Af Amer 6 (*)    All other components within normal limits  TROPONIN I - Abnormal; Notable for the following components:   Troponin I 0.05 (*)    All other components within normal limits   ____________________________________________  EKG  ED ECG REPORT I, [place] N Yevonne Yokum, the attending physician, personally viewed and interpreted this ECG.   Date: 12/22/2017  EKG Time: 10:36 PM  Rate: 89  Rhythm: Normal sinus rhythm  Axis: Normal  Intervals: Normal  ST&T Change: Inferior lateral ST segment depression with T wave inversions as well as T wave inversions in the lateral leads I, aVL, V5 and V6.  ED ECG REPORT I, [place] N Darron Stuck, the attending physician, personally viewed and interpreted this ECG.   Date: 12/23/2017  EKG Time: 11:39 PM  Rate: 81  Rhythm: Normal sinus rhythm  Axis: Normal  Intervals: Normal  ST&T Change: T wave inversions in V 3 through V6, T wave inversion in lead II.  Improvement of ST segment depression as compared to previous EKG.  ED ECG REPORT I, [place] N Tion Tse, the attending physician, personally viewed and interpreted this ECG.   Date: 12/23/2017  EKG Time: 1:01 AM  Rate: 86  Rhythm: Normal sinus rhythm  Axis: Normal  Intervals: Normal  ST&T Change: Worsen ST segment depression to 3 and aVF as well as anteriolateral V3 through V6 as well as 1 and aVL.      ____________________________________________  RADIOLOGY I, Gregor Hams, personally viewed and evaluated these images (plain radiographs) as part of my medical decision making, as well as reviewing the written report by the radiologist.  ED MD interpretation: No active cardiopulmonary disease per radiologist.  Official radiology report(s): Dg Chest 1 View  Result Date: 12/22/2017 CLINICAL DATA:  Left-sided chest pain radiating to the neck and arm for several months. EXAM: CHEST  1 VIEW COMPARISON:  12/07/2017  FINDINGS: Stable cardiomegaly without acute  pneumonic consolidation or CHF. Aortic atherosclerosis at the arch without aneurysm. Calcific tendinopathy or bursitis about the left shoulder. Minimal undersurface spurring off the distal clavicle at the [facility] joints. No acute osseous abnormality. IMPRESSION: Aortic atherosclerosis with stable cardiomegaly. No active pulmonary disease. Left rotator cuff calcific tendinopathy or calcific bursitis noted. Electronically Signed   By: Ashley Royalty M.D.   On: 12/22/2017 22:57      Procedures   ____________________________________________   INITIAL IMPRESSION / ASSESSMENT AND PLAN / ED COURSE  As part of my medical decision making, I reviewed the following data within the electronic MEDICAL RECORD NUMBER   67 year old female presented with above-stated history and physical exam secondary to chest pain.  EKG revealed evidence of ST segment depression with T wave inversions anterior inferior.  I suspect the patient's pain to be secondary to angina however did consider possibility of myocardial infarction and as such troponin was ordered which was elevated at 0.05 which patient appears to have persistently elevated troponin.     Patient subsequently reported to nursing staff that chest pain reoccurred this time at rest EKG was performed at that time which revealed worsening ST segment depression anterior inferiorly with a lateral component as well.  1/2 inch Nitropaste was applied to the patient's chest with pain resolution again.  Patient discussed with Dr. Jannifer Franklin for hospital admission for further evaluation and management. ____________________________________________  FINAL CLINICAL IMPRESSION(S) / ED DIAGNOSES  Final diagnoses:  Angina pectoris ([place])  Chest pain   MEDICATIONS GIVEN DURING THIS VISIT:  Medications  nitroGLYCERIN (NITROGLYN) 2 % ointment 0.5 inch (0.5 inches Topical Given 12/23/17 0117)     ED Discharge Orders    None       Note:   This document was prepared using Dragon voice recognition software and may include unintentional dictation errors.    Gregor Hams, MD 12/23/17 [id number]

## 2017-12-22 NOTE — ED Notes (Signed)
ED Provider at bedside. 

## 2017-12-22 NOTE — ED Notes (Signed)
Portable XR at bedside

## 2017-12-22 NOTE — ED Notes (Signed)
Date and time results received: 12/22/17 2335  Test: Troponin Critical Value: 0.05  Name of Provider Notified: Owens Shark  Orders Received? Or Actions Taken?: None

## 2017-12-23 ENCOUNTER — Other Ambulatory Visit: Payer: Self-pay

## 2017-12-23 DIAGNOSIS — I25728 Atherosclerosis of autologous artery coronary artery bypass graft(s) with other forms of angina pectoris: Secondary | ICD-10-CM | POA: Diagnosis not present

## 2017-12-23 LAB — TROPONIN I
Troponin I: 0.07 ng/mL (ref <0.03)
Troponin I: 0.07 ng/mL (ref <0.03)
Troponin I: 0.06 ng/mL (ref <0.03)

## 2017-12-23 LAB — TSH: TSH: 0.323 u[IU]/mL — ABNORMAL LOW (ref 0.350–4.500)

## 2017-12-23 MED ORDER — VENLAFAXINE HCL ER 75 MG PO CP24
75.0000 mg | ORAL_CAPSULE | Freq: Every day | ORAL | Status: DC
  Administered 2017-12-23 – 2017-12-25 (×3): 75 mg via ORAL
  Filled 2017-12-23 (×3): qty 1

## 2017-12-23 MED ORDER — NICOTINE 21 MG/24HR TD PT24: 21.0000 mg | MEDICATED_PATCH | Freq: Every day | TRANSDERMAL | Status: DC

## 2017-12-23 MED ORDER — HYDROCODONE-ACETAMINOPHEN 5-325 MG PO TABS
1.0000 | ORAL_TABLET | Freq: Four times a day (QID) | ORAL | Status: DC | PRN
  Administered 2017-12-25: 1 via ORAL
  Filled 2017-12-23: qty 1

## 2017-12-23 MED ORDER — MECLIZINE HCL 12.5 MG PO TABS
12.5000 mg | ORAL_TABLET | Freq: Three times a day (TID) | ORAL | Status: DC | PRN
Start: 2017-12-23 — End: 2017-12-25
  Filled 2017-12-23: qty 1

## 2017-12-23 MED ORDER — CALCIUM CARBONATE ANTACID 500 MG PO CHEW
600.0000 mg | CHEWABLE_TABLET | Freq: Every day | ORAL | Status: DC
  Administered 2017-12-23 – 2017-12-24 (×2): 600 mg via ORAL
  Filled 2017-12-23 (×3): qty 3

## 2017-12-23 MED ORDER — SODIUM CHLORIDE 0.9% FLUSH
3.0000 mL | Freq: Two times a day (BID) | INTRAVENOUS | Status: DC
  Administered 2017-12-23 – 2017-12-24 (×3): 3 mL via INTRAVENOUS

## 2017-12-23 MED ORDER — MOMETASONE FURO-FORMOTEROL FUM 200-5 MCG/ACT IN AERO
2.0000 | INHALATION_SPRAY | Freq: Two times a day (BID) | RESPIRATORY_TRACT | Status: DC
  Administered 2017-12-23 – 2017-12-24 (×4): 2 via RESPIRATORY_TRACT
  Filled 2017-12-23: qty 8.8

## 2017-12-23 MED ORDER — PANTOPRAZOLE SODIUM 40 MG PO TBEC
40.0000 mg | DELAYED_RELEASE_TABLET | Freq: Two times a day (BID) | ORAL | Status: DC
  Administered 2017-12-23 – 2017-12-25 (×5): 40 mg via ORAL
  Filled 2017-12-23 (×5): qty 1

## 2017-12-23 MED ORDER — ONDANSETRON HCL 4 MG/2ML IJ SOLN: 4.0000 mg | Freq: Four times a day (QID) | INTRAMUSCULAR | Status: DC | PRN

## 2017-12-23 MED ORDER — TIOTROPIUM BROMIDE MONOHYDRATE 18 MCG IN CAPS
1.0000 | ORAL_CAPSULE | Freq: Every day | RESPIRATORY_TRACT | Status: DC
  Administered 2017-12-23 – 2017-12-25 (×3): 18 ug via RESPIRATORY_TRACT
  Filled 2017-12-23: qty 5

## 2017-12-23 MED ORDER — NITROGLYCERIN 0.4 MG SL SUBL
0.4000 mg | SUBLINGUAL_TABLET | SUBLINGUAL | Status: DC | PRN
  Administered 2017-12-24: 0.4 mg via SUBLINGUAL
  Filled 2017-12-23: qty 1

## 2017-12-23 MED ORDER — METHOCARBAMOL 500 MG PO TABS
500.0000 mg | ORAL_TABLET | Freq: Two times a day (BID) | ORAL | Status: DC | PRN
  Filled 2017-12-23: qty 1.5

## 2017-12-23 MED ORDER — CINACALCET HCL 30 MG PO TABS
30.0000 mg | ORAL_TABLET | Freq: Every day | ORAL | Status: DC
  Administered 2017-12-23 – 2017-12-25 (×3): 30 mg via ORAL
  Filled 2017-12-23 (×3): qty 1

## 2017-12-23 MED ORDER — NITROGLYCERIN 2 % TD OINT
0.5000 [in_us] | TOPICAL_OINTMENT | Freq: Once | TRANSDERMAL | Status: AC
  Administered 2017-12-23: 0.5 [in_us] via TOPICAL

## 2017-12-23 MED ORDER — AMLODIPINE BESYLATE 10 MG PO TABS
10.0000 mg | ORAL_TABLET | Freq: Every day | ORAL | Status: DC
  Administered 2017-12-24 – 2017-12-25 (×2): 10 mg via ORAL
  Filled 2017-12-23 (×2): qty 1

## 2017-12-23 MED ORDER — FOLIC ACID 1 MG PO TABS
1.0000 mg | ORAL_TABLET | Freq: Every day | ORAL | Status: DC
  Administered 2017-12-23 – 2017-12-25 (×3): 1 mg via ORAL
  Filled 2017-12-23 (×3): qty 1

## 2017-12-23 MED ORDER — HEPARIN SODIUM (PORCINE) 5000 UNIT/ML IJ SOLN
5000.0000 [IU] | Freq: Three times a day (TID) | INTRAMUSCULAR | Status: DC
  Administered 2017-12-23 – 2017-12-25 (×6): 5000 [IU] via SUBCUTANEOUS
  Filled 2017-12-23 (×5): qty 1

## 2017-12-23 MED ORDER — METOPROLOL SUCCINATE ER 100 MG PO TB24
100.0000 mg | ORAL_TABLET | Freq: Every day | ORAL | Status: DC
  Administered 2017-12-24 – 2017-12-25 (×2): 100 mg via ORAL
  Filled 2017-12-23 (×2): qty 1

## 2017-12-23 MED ORDER — ASPIRIN EC 81 MG PO TBEC
81.0000 mg | DELAYED_RELEASE_TABLET | Freq: Every day | ORAL | Status: DC
  Administered 2017-12-23 – 2017-12-25 (×3): 81 mg via ORAL
  Filled 2017-12-23 (×3): qty 1

## 2017-12-23 MED ORDER — ONDANSETRON HCL 4 MG PO TABS: 4.0000 mg | ORAL_TABLET | Freq: Four times a day (QID) | ORAL | Status: DC | PRN

## 2017-12-23 MED ORDER — ACETAMINOPHEN 650 MG RE SUPP: 650.0000 mg | Freq: Four times a day (QID) | RECTAL | Status: DC | PRN

## 2017-12-23 MED ORDER — DOCUSATE SODIUM 100 MG PO CAPS
100.0000 mg | ORAL_CAPSULE | Freq: Two times a day (BID) | ORAL | Status: DC
  Administered 2017-12-23 – 2017-12-25 (×5): 100 mg via ORAL
  Filled 2017-12-23 (×5): qty 1

## 2017-12-23 MED ORDER — ACETAMINOPHEN 325 MG PO TABS
650.0000 mg | ORAL_TABLET | Freq: Four times a day (QID) | ORAL | Status: DC | PRN
  Administered 2017-12-23: 650 mg via ORAL
  Filled 2017-12-23: qty 2

## 2017-12-23 MED ORDER — NITROGLYCERIN 2 % TD OINT
TOPICAL_OINTMENT | TRANSDERMAL | Status: AC
  Administered 2017-12-23: 0.5 [in_us] via TOPICAL
  Filled 2017-12-23: qty 1

## 2017-12-23 MED ORDER — GABAPENTIN 100 MG PO CAPS
100.0000 mg | ORAL_CAPSULE | ORAL | Status: DC
  Administered 2017-12-23 – 2017-12-25 (×3): 100 mg via ORAL
  Filled 2017-12-23 (×2): qty 1

## 2017-12-23 MED ORDER — SEVELAMER CARBONATE 2.4 G PO PACK: 2.4000 g | PACK | Freq: Two times a day (BID) | ORAL | Status: DC

## 2017-12-23 MED ORDER — ROSUVASTATIN CALCIUM 10 MG PO TABS
20.0000 mg | ORAL_TABLET | Freq: Every day | ORAL | Status: DC
  Administered 2017-12-23 – 2017-12-25 (×3): 20 mg via ORAL
  Filled 2017-12-23: qty 1
  Filled 2017-12-23 (×3): qty 2

## 2017-12-23 MED ORDER — NICOTINE 21 MG/24HR TD PT24
21.0000 mg | MEDICATED_PATCH | Freq: Every day | TRANSDERMAL | Status: DC
  Administered 2017-12-23 – 2017-12-24 (×2): 21 mg via TRANSDERMAL
  Filled 2017-12-23 (×2): qty 1

## 2017-12-23 NOTE — H&P (Signed)
Kimberly Shaffer is an 67 y.o. female.   Chief Complaint: Chest pain HPI: The patient with past medical history of CAD status post myocardial infarction, ESRD on dialysis, COPD and diabetes presents the emergency department with chest pain.  The patient states that she had chest pain earlier in the evening and took 2 nitroglycerin.  It resolved her pain and the patient went to eat oysters.  She finished her meal without incident but began to have some chest pain walking back to the car.  She took nitroglycerin again which made her nauseous.  She had multiple episodes of nonbloody nonbilious emesis.  Throughout this time the patient states that her left chest was hurting.  She describes the pain as a squeezing sensation.  It did not radiate this time but has radiated down her left arm in the past.  In emergency department the patient's pain eased off but then returned.  Due to her history of coronary artery disease and ongoing chest pain the emergency department staff called the hospitalist service for admission.  Past Medical History:  Diagnosis Date  . Asthma   . CAD (coronary artery disease)   . Chronic lower back pain   . COPD (chronic obstructive pulmonary disease) ([place])   . Depression   . Diabetes mellitus without complication (HCC)    NIDD  . H/O angina pectoris   . H/O blood clots   . Hypercholesteremia   . Hypertension   . Left rotator cuff tear   . Myocardial infarction ([place])   . Obesity   . Renal insufficiency   . Stroke ([place])   . Vertigo, aural     Past Surgical History:  Procedure Laterality Date  . A/V FISTULAGRAM Left 03/14/2017   Procedure: A/V Fistulagram;  Surgeon: Katha Cabal, MD;  Location: [place] [facility];  Service: Cardiovascular;  Laterality: Left;  . AV FISTULA PLACEMENT Left 11-04-2015  . AV FISTULA PLACEMENT Left 11/04/2015   Procedure: ARTERIOVENOUS (AV) FISTULA CREATION  ( BRACHIAL CEPHALIC );  Surgeon: Katha Cabal, MD;  Location: [facility];   Service: Vascular;  Laterality: Left;  . CARDIAC CATHETERIZATION    . CARDIAC CATHETERIZATION Left 07/19/2016   Procedure: Left Heart Cath and Coronary Angiography;  Surgeon: Corey Skains, MD;  Location: [place] [facility];  Service: Cardiovascular;  Laterality: Left;  . CORONARY ARTERY BYPASS GRAFT  2012  . CORONARY STENT PLACEMENT    . JOINT REPLACEMENT    . KNEE SURGERY Bilateral   . PERIPHERAL VASCULAR CATHETERIZATION N/A 11/13/2015   Procedure: Dialysis/Perma Catheter Insertion;  Surgeon: Katha Cabal, MD;  Location: [place] [facility];  Service: Cardiovascular;  Laterality: N/A;  . PERIPHERAL VASCULAR CATHETERIZATION N/A 12/04/2015   Procedure: Dialysis/Perma Catheter Insertion;  Surgeon: Katha Cabal, MD;  Location: [place] [facility];  Service: Cardiovascular;  Laterality: N/A;  . PERIPHERAL VASCULAR CATHETERIZATION Left 12/31/2015   Procedure: A/V Shuntogram/Fistulagram;  Surgeon: Algernon Huxley, MD;  Location: [place] [facility];  Service: Cardiovascular;  Laterality: Left;  . PERIPHERAL VASCULAR CATHETERIZATION N/A 12/31/2015   Procedure: A/V Shunt Intervention;  Surgeon: Algernon Huxley, MD;  Location: [place] [facility];  Service: Cardiovascular;  Laterality: N/A;  . PERIPHERAL VASCULAR CATHETERIZATION Left 02/25/2016   Procedure: A/V Shuntogram/Fistulagram;  Surgeon: Algernon Huxley, MD;  Location: [place] [facility];  Service: Cardiovascular;  Laterality: Left;  . PERIPHERAL VASCULAR CATHETERIZATION N/A 03/18/2016   Procedure: Dialysis/Perma Catheter Removal;  Surgeon: Katha Cabal,  MD;  Location: [place] [facility];  Service: Cardiovascular;  Laterality: N/A;  . REMOVAL OF A DIALYSIS CATHETER N/A 06/30/2017   Procedure: REMOVAL OF A DIALYSIS CATHETER;  Surgeon: Katha Cabal, MD;  Location: [facility];  Service: Vascular;  Laterality: N/A;  . REPLACEMENT TOTAL KNEE BILATERAL Bilateral     Family History  Problem Relation Age of Onset  . Cancer Mother   .  Cancer Father   . Diabetes Brother   . Heart disease Brother    Social History:  reports that she has been smoking cigarettes.  She has been smoking about 0.25 packs per day. She has never used smokeless tobacco. She reports that she does not drink alcohol or use drugs.  Allergies:  Allergies  Allergen Reactions  . Nystatin Hives and Itching  . Sulfa Antibiotics Swelling, Hives and Rash  . Niaspan [Niacin] Other (See Comments) and Hives    Prior to Admission medications   Medication Sig Start Date End Date Taking? Authorizing Provider  amLODipine (NORVASC) 10 MG tablet Take 10 mg by mouth daily.   Yes [provider]  metoprolol succinate (TOPROL-XL) 100 MG 24 hr tablet Take 100 mg by mouth daily. Take with or immediately following a meal.   Yes [provider]  albuterol (PROVENTIL HFA;VENTOLIN HFA) 108 (90 BASE) MCG/ACT inhaler Inhale into the lungs every 4 (four) hours as needed for wheezing or shortness of breath. 2-4 puffs    [provider]  aspirin EC 81 MG tablet Take 81 mg by mouth daily.    [provider]  calcium carbonate (OSCAL) 1500 (600 Ca) MG TABS tablet Take 600 mg of elemental calcium by mouth daily with breakfast.    [provider]  folic acid (FOLVITE) 1 MG tablet Take 1 mg by mouth daily.    [provider]  gabapentin (NEURONTIN) 100 MG capsule Take 100 mg by mouth every morning.    [provider]  HYDROcodone-acetaminophen (NORCO/VICODIN) 5-325 MG tablet Take 1 tablet by mouth every 6 (six) hours as needed for moderate pain. 07/01/17 07/01/18  Loletha Grayer, MD  ipratropium (ATROVENT) 0.02 % nebulizer solution Take 2.5 mLs (0.5 mg total) by nebulization every 6 (six) hours as needed for wheezing or shortness of breath. 01/19/16   Laverle Hobby, MD  levocetirizine (XYZAL) 5 MG tablet Take 5 mg by mouth daily.    [provider]  lubiprostone (AMITIZA) 24 MCG capsule Take 24 mcg by mouth  daily as needed for constipation.    [provider]  meclizine (ANTIVERT) 12.5 MG tablet Take 1 tablet (12.5 mg total) by mouth 3 (three) times daily as needed for dizziness or nausea. 08/27/16   Daymon Larsen, MD  meloxicam (MOBIC) 15 MG tablet Take 1 tablet (15 mg total) by mouth daily. 11/16/17 11/16/18  Fisher, Linden Dolin, PA-C  methocarbamol (ROBAXIN) 500 MG tablet Take 500-750 mg by mouth 2 (two) times daily as needed. For muscle spasms 02/28/17   [provider]  nicotine (NICODERM CQ - DOSED IN MG/24 HOURS) 21 mg/24hr patch Place 1 patch (21 mg total) onto the skin daily. 07/01/17   Loletha Grayer, MD  nitroGLYCERIN (NITROLINGUAL) 0.4 MG/SPRAY spray Place 1 spray under the tongue every 5 (five) minutes x 3 doses as needed for chest pain.    [provider]  Nutritional Supplements (FEEDING SUPPLEMENT, NEPRO CARB STEADY,) LIQD Take 237 mLs by mouth 2 (two) times daily between meals. 12/08/17   Fritzi Mandes, MD  pantoprazole (PROTONIX) 40 MG tablet Take 40 mg by mouth 2 (two) times daily.    [provider]  RENVELA 2.4 g PACK Take 2.4 g by mouth 2 (two) times daily.    [provider]  rosuvastatin (CRESTOR) 20 MG tablet Take 20 mg by mouth daily.    [provider]  SENSIPAR 30 MG tablet Take 30 mg by mouth daily. 07/01/16   [provider]  SPIRIVA HANDIHALER 18 MCG inhalation capsule Place 1 capsule into inhaler and inhale daily.    [provider]  SYMBICORT 160-4.5 MCG/ACT inhaler Inhale 2 puffs into the lungs 2 (two) times daily.    [provider]  venlafaxine XR (EFFEXOR-XR) 75 MG 24 hr capsule Take 75 mg by mouth daily.    [provider]  VITAMIN E PO Take 1 capsule by mouth daily.    [provider]     Results for orders placed or performed during the hospital encounter of 12/22/17 (from the past 48 hour(s))  CBC with Differential     Status: Abnormal   Collection Time: 12/22/17  10:58 PM  Result Value Ref Range   WBC 7.6 3.6 - 11.0 K/uL   RBC 2.60 (L) 3.80 - 5.20 MIL/uL   Hemoglobin 8.0 (L) 12.0 - 16.0 g/dL   HCT 25.1 (L) 35.0 - 47.0 %   MCV 96.5 80.0 - 100.0 fL   MCH 30.9 26.0 - 34.0 pg   MCHC 32.0 32.0 - 36.0 g/dL   RDW 16.2 (H) 11.5 - 14.5 %   Platelets 193 150 - 440 K/uL   Neutrophils Relative % 66 %   Neutro Abs 5.1 1.4 - 6.5 K/uL   Lymphocytes Relative 21 %   Lymphs Abs 1.6 1.0 - 3.6 K/uL   Monocytes Relative 10 %   Monocytes Absolute 0.7 0.2 - 0.9 K/uL   Eosinophils Relative 2 %   Eosinophils Absolute 0.2 0 - 0.7 K/uL   Basophils Relative 1 %   Basophils Absolute 0.0 0 - 0.1 K/uL    Comment: Performed at [facility], [address] [place], [place] [address]  Basic metabolic panel     Status: Abnormal   Collection Time: 12/22/17 10:58 PM  Result Value Ref Range   Sodium 138 135 - 145 mmol/L   Potassium 3.7 3.5 - 5.1 mmol/L   Chloride 103 101 - 111 mmol/L   CO2 25 22 - 32 mmol/L   Glucose, Bld 179 (H) 65 - 99 mg/dL   BUN 49 (H) 6 - 20 mg/dL   Creatinine, Ser 7.48 (H) 0.44 - 1.00 mg/dL   Calcium 8.3 (L) 8.9 - 10.3 mg/dL   GFR calc non Af Amer 5 (L) >60 mL/min   GFR calc Af Amer 6 (L) >60 mL/min    Comment: (NOTE) The eGFR has been calculated using the CKD EPI equation. This calculation has not been validated in all clinical situations. eGFR's persistently <60 mL/min signify possible Chronic Kidney Disease.    Anion gap 10 5 - 15    Comment: Performed at [facility], [address] [place], [place] [address]  Troponin I     Status: Abnormal   Collection Time: 12/22/17 10:58 PM  Result Value Ref Range   Troponin I 0.05 (HH) <0.03 ng/mL    Comment: CRITICAL RESULT CALLED TO, READ BACK BY AND VERIFIED WITH DAVID WALKER AT 2335 12/22/17.PMH Performed at [facility], [address], [place],  [address]  Dg Chest 1 View  Result Date: 12/22/2017 CLINICAL DATA:  Left-sided chest pain radiating to the  neck and arm for several months. EXAM: CHEST  1 VIEW COMPARISON:  12/07/2017 FINDINGS: Stable cardiomegaly without acute pneumonic consolidation or CHF. Aortic atherosclerosis at the arch without aneurysm. Calcific tendinopathy or bursitis about the left shoulder. Minimal undersurface spurring off the distal clavicle at the [facility] joints. No acute osseous abnormality. IMPRESSION: Aortic atherosclerosis with stable cardiomegaly. No active pulmonary disease. Left rotator cuff calcific tendinopathy or calcific bursitis noted. Electronically Signed   By: Ashley Royalty M.D.   On: 12/22/2017 22:57    Review of Systems  Constitutional: Negative for chills and fever.  HENT: Negative for sore throat and tinnitus.   Eyes: Negative for blurred vision and redness.  Respiratory: Negative for cough and shortness of breath.   Cardiovascular: Positive for chest pain. Negative for palpitations, orthopnea and PND.  Gastrointestinal: Positive for nausea and vomiting. Negative for abdominal pain and diarrhea.  Genitourinary: Negative for dysuria, frequency and urgency.  Musculoskeletal: Negative for joint pain and myalgias.  Skin: Negative for rash.       No lesions  Neurological: Negative for speech change, focal weakness and weakness.  Endo/Heme/Allergies: Does not bruise/bleed easily.       No temperature intolerance  Psychiatric/Behavioral: Negative for depression and suicidal ideas.    Blood pressure 132/69, pulse 92, temperature 98 F (36.7 C), temperature source Oral, resp. rate (!) 24, height '5\' 6"'$  (1.676 m), weight 99.8 kg (220 lb), SpO2 98 %. Physical Exam  Vitals reviewed. Constitutional: She is oriented to person, place, and time. She appears well-developed and well-nourished. No distress.  HENT:  Head: Normocephalic and atraumatic.  Mouth/Throat: Oropharynx is clear and moist.  Eyes: Pupils are equal, round, and reactive to light. Conjunctivae and EOM are normal. No scleral icterus.  Neck: Normal  range of motion. Neck supple. No JVD present. No tracheal deviation present. No thyromegaly present.  Cardiovascular: Normal rate, regular rhythm and normal heart sounds. Exam reveals no gallop and no friction rub.  No murmur heard. Respiratory: Effort normal and breath sounds normal.  GI: Soft. Bowel sounds are normal. She exhibits no distension. There is no tenderness.  Genitourinary:  Genitourinary Comments: Deferred  Musculoskeletal: Normal range of motion. She exhibits no edema.  Lymphadenopathy:    She has no cervical adenopathy.  Neurological: She is alert and oriented to person, place, and time. No cranial nerve deficit. She exhibits normal muscle tone.  Skin: Skin is warm and dry. No rash noted. No erythema.  Psychiatric: She has a normal mood and affect. Her behavior is normal. Judgment and thought content normal.     Assessment/Plan This is a 67 year old female admitted for chest pain. 1.  Chest pain: Atypical pain and duration although may represent mismatch supply and demand.  The patient has not yet had her stress test recommended by cardiology.  Cycle cardiac enzymes.  Monitor telemetry.  Consult cardiology. 2.  Hypertension: Controlled; continue amlodipine and metoprolol 3.  COPD: No shortness of breath associated with this current episode.  Continue inhaled corticosteroid.  Albuterol as needed. 4.  ESRD: On dialysis; continue Renvela and Sensipar 5.  DVT prophylaxis: Heparin 6.  GI prophylaxis: Pantoprazole per home regimen The patient is a full code.  Time spent on admission orders and patient care approximately 45 minutes  Harrie Foreman, MD 12/23/2017, 5:25 AM

## 2017-12-23 NOTE — Consult Note (Signed)
Cardiology Consultation Note    Patient ID: Kimberly Shaffer, MRN: [id number], DOB/AGE: Sep 13, 1951 67 y.o. Admit date: 12/22/2017   Date of Consult: 12/23/2017 Primary Physician: [facility] Primary Cardiologist: Dr. Nehemiah Massed  Chief Complaint: Chest pain Reason for Consultation: chest pain Requesting MD: Dr. Posey Pronto  HPI: Kimberly Shaffer is a 67 y.o. female with history of coronary artery disease status post coronary artery bypass grafting with a left internal mammary to the LAD in 2012, status post cardiac cath in 2014 with a normal left main, 90% LAD proximal treated with PCI, normal left circumflex with 100% RCA stenosis.  Most recent cardiac catheterization done in 2017 revealed 100% proximal RCA, 100% distal RCA, 20% ostial LAD, 20% ostial circumflex, 75% ramus intermedius, 40% mid LAD, 65% distal LAD, 70% distal LAD, 45% second diagonal.  Medical management was recommended.  Test at that time was done to determine if there is evidence of ischemia in the LAD distribution which was unremarkable.  Also has a history of end-stage renal disease and is currently on hemodialysis on Tuesday Thursday Saturday.  Patient presented with complaints of chest pain which has resolved.  Electrocardiogram revealed sinus rhythm with T wave inversion in inferolateral leads.  These changes are new from previously.  Cardiac markers shows a troponin of 0.07.  It is of note her baseline troponin appears to be 0.05.  She is currently pain-free.  She was dialyzed today.  She denies shortness of breath or chest pain at present.  Fraction 40 to 45% with inferior hypokinesis.  Past Medical History:  Diagnosis Date  . Asthma   . CAD (coronary artery disease)   . Chronic lower back pain   . COPD (chronic obstructive pulmonary disease) ([place])   . Depression   . Diabetes mellitus without complication (HCC)    NIDD  . H/O angina pectoris   . H/O blood clots   . Hypercholesteremia   . Hypertension   . Left  rotator cuff tear   . Myocardial infarction ([place])   . Obesity   . Renal insufficiency   . Stroke ([place])   . Vertigo, aural       Surgical History:  Past Surgical History:  Procedure Laterality Date  . A/V FISTULAGRAM Left 03/14/2017   Procedure: A/V Fistulagram;  Surgeon: Katha Cabal, MD;  Location: [facility];  Service: Cardiovascular;  Laterality: Left;  . AV FISTULA PLACEMENT Left 11-04-2015  . AV FISTULA PLACEMENT Left 11/04/2015   Procedure: ARTERIOVENOUS (AV) FISTULA CREATION  ( BRACHIAL CEPHALIC );  Surgeon: Katha Cabal, MD;  Location: [facility];  Service: Vascular;  Laterality: Left;  . CARDIAC CATHETERIZATION    . CARDIAC CATHETERIZATION Left 07/19/2016   Procedure: Left Heart Cath and Coronary Angiography;  Surgeon: Corey Skains, MD;  Location: [facility];  Service: Cardiovascular;  Laterality: Left;  . CORONARY ARTERY BYPASS GRAFT  2012  . CORONARY STENT PLACEMENT    . JOINT REPLACEMENT    . KNEE SURGERY Bilateral   . PERIPHERAL VASCULAR CATHETERIZATION N/A 11/13/2015   Procedure: Dialysis/Perma Catheter Insertion;  Surgeon: Katha Cabal, MD;  Location: [facility];  Service: Cardiovascular;  Laterality: N/A;  . PERIPHERAL VASCULAR CATHETERIZATION N/A 12/04/2015   Procedure: Dialysis/Perma Catheter Insertion;  Surgeon: Katha Cabal, MD;  Location: [facility];  Service: Cardiovascular;  Laterality: N/A;  . PERIPHERAL VASCULAR CATHETERIZATION Left 12/31/2015   Procedure: A/V Shuntogram/Fistulagram;  Surgeon: Erskine Squibb  Lucky Cowboy, MD;  Location: [facility];  Service: Cardiovascular;  Laterality: Left;  . PERIPHERAL VASCULAR CATHETERIZATION N/A 12/31/2015   Procedure: A/V Shunt Intervention;  Surgeon: Algernon Huxley, MD;  Location: [facility];  Service: Cardiovascular;  Laterality: N/A;  . PERIPHERAL VASCULAR CATHETERIZATION Left 02/25/2016   Procedure: A/V Shuntogram/Fistulagram;  Surgeon: Algernon Huxley, MD;   Location: [facility];  Service: Cardiovascular;  Laterality: Left;  . PERIPHERAL VASCULAR CATHETERIZATION N/A 03/18/2016   Procedure: Dialysis/Perma Catheter Removal;  Surgeon: Katha Cabal, MD;  Location: [facility];  Service: Cardiovascular;  Laterality: N/A;  . REMOVAL OF A DIALYSIS CATHETER N/A 06/30/2017   Procedure: REMOVAL OF A DIALYSIS CATHETER;  Surgeon: Katha Cabal, MD;  Location: [facility];  Service: Vascular;  Laterality: N/A;  . REPLACEMENT TOTAL KNEE BILATERAL Bilateral      Home Meds: Prior to Admission medications   Medication Sig Start Date End Date Taking? Authorizing Provider  amLODipine (NORVASC) 10 MG tablet Take 10 mg by mouth daily.   Yes [provider]  metoprolol succinate (TOPROL-XL) 100 MG 24 hr tablet Take 100 mg by mouth daily. Take with or immediately following a meal.   Yes [provider]  albuterol (PROVENTIL HFA;VENTOLIN HFA) 108 (90 BASE) MCG/ACT inhaler Inhale into the lungs every 4 (four) hours as needed for wheezing or shortness of breath. 2-4 puffs    [provider]  aspirin EC 81 MG tablet Take 81 mg by mouth daily.    [provider]  calcium carbonate (OSCAL) 1500 (600 Ca) MG TABS tablet Take 600 mg of elemental calcium by mouth daily with breakfast.    [provider]  folic acid (FOLVITE) 1 MG tablet Take 1 mg by mouth daily.    [provider]  gabapentin (NEURONTIN) 100 MG capsule Take 100 mg by mouth every morning.    [provider]  HYDROcodone-acetaminophen (NORCO/VICODIN) 5-325 MG tablet Take 1 tablet by mouth every 6 (six) hours as needed for moderate pain. 07/01/17 07/01/18  Loletha Grayer, MD  ipratropium (ATROVENT) 0.02 % nebulizer solution Take 2.5 mLs (0.5 mg total) by nebulization every 6 (six) hours as needed for wheezing or shortness of breath. 01/19/16   Laverle Hobby, MD  levocetirizine (XYZAL) 5 MG tablet Take 5 mg by mouth daily.     [provider]  lubiprostone (AMITIZA) 24 MCG capsule Take 24 mcg by mouth daily as needed for constipation.    [provider]  meclizine (ANTIVERT) 12.5 MG tablet Take 1 tablet (12.5 mg total) by mouth 3 (three) times daily as needed for dizziness or nausea. 08/27/16   Daymon Larsen, MD  meloxicam (MOBIC) 15 MG tablet Take 1 tablet (15 mg total) by mouth daily. 11/16/17 11/16/18  Fisher, Linden Dolin, PA-C  methocarbamol (ROBAXIN) 500 MG tablet Take 500-750 mg by mouth 2 (two) times daily as needed. For muscle spasms 02/28/17   [provider]  nicotine (NICODERM CQ - DOSED IN MG/24 HOURS) 21 mg/24hr patch Place 1 patch (21 mg total) onto the skin daily. 07/01/17   Loletha Grayer, MD  nitroGLYCERIN (NITROLINGUAL) 0.4 MG/SPRAY spray Place 1 spray under the tongue every 5 (five) minutes x 3 doses as needed for chest pain.    [provider]  Nutritional Supplements (FEEDING SUPPLEMENT, NEPRO CARB STEADY,) LIQD Take 237 mLs by mouth 2 (two) times daily between meals. 12/08/17   Fritzi Mandes, MD  pantoprazole (PROTONIX) 40 MG  tablet Take 40 mg by mouth 2 (two) times daily.    [provider]  RENVELA 2.4 g PACK Take 2.4 g by mouth 2 (two) times daily.    [provider]  rosuvastatin (CRESTOR) 20 MG tablet Take 20 mg by mouth daily.    [provider]  SENSIPAR 30 MG tablet Take 30 mg by mouth daily. 07/01/16   [provider]  SPIRIVA HANDIHALER 18 MCG inhalation capsule Place 1 capsule into inhaler and inhale daily.    [provider]  SYMBICORT 160-4.5 MCG/ACT inhaler Inhale 2 puffs into the lungs 2 (two) times daily.    [provider]  venlafaxine XR (EFFEXOR-XR) 75 MG 24 hr capsule Take 75 mg by mouth daily.    [provider]  VITAMIN E PO Take 1 capsule by mouth daily.    [provider]    Inpatient Medications:  . amLODipine  10 mg Oral Daily  . aspirin EC  81 mg Oral Daily  . calcium  carbonate  600 mg of elemental calcium Oral Q breakfast  . cinacalcet  30 mg Oral Daily  . docusate sodium  100 mg Oral BID  . folic acid  1 mg Oral Daily  . gabapentin  100 mg Oral BH-q7a  . heparin  5,000 Units Subcutaneous Q8H  . metoprolol succinate  100 mg Oral Daily  . mometasone-formoterol  2 puff Inhalation BID  . pantoprazole  40 mg Oral BID  . rosuvastatin  20 mg Oral Daily  . sevelamer carbonate  2.4 g Oral BID WC  . tiotropium  1 capsule Inhalation Daily  . venlafaxine XR  75 mg Oral Daily     Allergies:  Allergies  Allergen Reactions  . Nystatin Hives and Itching  . Sulfa Antibiotics Swelling, Hives and Rash  . Niaspan [Niacin] Other (See Comments) and Hives    Social History   Socioeconomic History  . Marital status: Widowed    Spouse name: Not on file  . Number of children: Not on file  . Years of education: Not on file  . Highest education level: Not on file  Occupational History  . Not on file  Social Needs  . Financial resource strain: Not on file  . Food insecurity:    Worry: Not on file    Inability: Not on file  . Transportation needs:    Medical: Not on file    Non-medical: Not on file  Tobacco Use  . Smoking status: Current Every Day Smoker    Packs/day: 0.25    Types: Cigarettes  . Smokeless tobacco: Never Used  Substance and Sexual Activity  . Alcohol use: No    Alcohol/week: 0.0 oz  . Drug use: No  . Sexual activity: Yes  Lifestyle  . Physical activity:    Days per week: Not on file    Minutes per session: Not on file  . Stress: Not on file  Relationships  . Social connections:    Talks on phone: Not on file    Gets together: Not on file    Attends religious service: Not on file    Active member of club or organization: Not on file    Attends meetings of clubs or organizations: Not on file    Relationship status: Not on file  . Intimate partner violence:    Fear of current or ex partner: Not on file    Emotionally abused:  Not on file    Physically abused:  Not on file    Forced sexual activity: Not on file  Other Topics Concern  . Not on file  Social History Narrative  . Not on file     Family History  Problem Relation Age of Onset  . Cancer Mother   . Cancer Father   . Diabetes Brother   . Heart disease Brother      Review of Systems: A 12-system review of systems was performed and is negative except as noted in the HPI.  Labs: Recent Labs    12/22/17 2258 12/23/17 0845  TROPONINI 0.05* 0.07*   Lab Results  Component Value Date   WBC 7.6 12/22/2017   HGB 8.0 (L) 12/22/2017   HCT 25.1 (L) 12/22/2017   MCV 96.5 12/22/2017   PLT 193 12/22/2017    Recent Labs  Lab 12/22/17 2258  NA 138  K 3.7  CL 103  CO2 25  BUN 49*  CREATININE 7.48*  CALCIUM 8.3*  GLUCOSE 179*   Lab Results  Component Value Date   CHOL 118 12/17/2015   HDL 24 (L) 12/17/2015   LDLCALC 66 12/17/2015   TRIG 138 12/17/2015   No results found for: DDIMER  Radiology/Studies:  Dg Chest 1 View  Result Date: 12/22/2017 CLINICAL DATA:  Left-sided chest pain radiating to the neck and arm for several months. EXAM: CHEST  1 VIEW COMPARISON:  12/07/2017 FINDINGS: Stable cardiomegaly without acute pneumonic consolidation or CHF. Aortic atherosclerosis at the arch without aneurysm. Calcific tendinopathy or bursitis about the left shoulder. Minimal undersurface spurring off the distal clavicle at the [facility] joints. No acute osseous abnormality. IMPRESSION: Aortic atherosclerosis with stable cardiomegaly. No active pulmonary disease. Left rotator cuff calcific tendinopathy or calcific bursitis noted. Electronically Signed   By: Ashley Royalty M.D.   On: 12/22/2017 22:57   Dg Chest Port 1 View  Result Date: 12/07/2017 CLINICAL DATA:  Shortness of breath.  Chest pain. EXAM: PORTABLE CHEST 1 VIEW COMPARISON:  08/27/2016 FINDINGS: Again seen cardiomegaly, unchanged mediastinal contours. There is central peribronchial thickening  suspicious for pulmonary edema. No confluent airspace disease. No large pleural effusion. No pneumothorax. IMPRESSION: Cardiomegaly. Peribronchial cuffing suspicious for pulmonary edema, less likely bronchial inflammation/bronchitis. Electronically Signed   By: Jeb Levering M.D.   On: 12/07/2017 23:48    Wt Readings from Last 3 Encounters:  12/23/17 102.6 kg (226 lb 3.2 oz)  12/08/17 107.1 kg (236 lb 1.8 oz)  11/16/17 103 kg (227 lb)    EKG: Sinus rhythm with ST-T wave changes in inferolateral leads.  Physical Exam:  Blood pressure (!) 164/105, pulse 83, temperature 98.7 F (37.1 C), temperature source Oral, resp. rate 18, height 5\' 6"  (1.676 m), weight 102.6 kg (226 lb 3.2 oz), SpO2 96 %. Body mass index is 36.51 kg/m. General: Well developed, well nourished, in no acute distress. Head: Normocephalic, atraumatic, sclera non-icteric, no xanthomas, nares are without discharge.  Neck: Negative for carotid bruits. JVD not elevated. Lungs: Clear bilaterally to auscultation without wheezes, rales, or rhonchi. Breathing is unlabored. Heart: RRR with S1 S2. No murmurs, rubs, or gallops appreciated. Abdomen: Soft, non-tender, non-distended with normoactive bowel sounds. No hepatomegaly. No rebound/guarding. No obvious abdominal masses. Msk:  Strength and tone appear normal for age. Extremities: No clubbing or cyanosis. No edema.  Distal pedal pulses are 2+ and equal bilaterally. Neuro: Alert and oriented X 3. No facial asymmetry. No focal deficit. Moves all extremities spontaneously. Psych:  Responds to questions appropriately with a normal affect.  Assessment and Plan  Patient is a 67 year old female with history of coronary disease status post multiple coronary interventions including PCI of the distal LAD, history of single-vessel coronary artery bypass grafting to the LAD with a left internal mammary, now admitted with chest pain.  Mild troponin elevation minimally above her baseline.   She is due for hemodialysis today.  Electrocardiogram shows some inferior changes which are somewhat new.  Will proceed with hemodialysis today.  We will follow her symptoms as well as troponin.  We will repeat echocardiogram to see if there is any change in wall motion abnormality.  Further decisions regarding invasive versus empiric medical therapy will depend on course of the next 24 hours.  Would not placed on heparin or Plavix due to dialysis.  Continue with enteric-coated aspirin.  Would continue with amlodipine at 10 mg daily, metoprolol succinate 800 mg daily and topical nitrates at 0.5 mg daily for now and follow.  We will follow with you.  Signed, Teodoro Spray MD 12/23/2017, 10:22 AM Pager: [phone]

## 2017-12-23 NOTE — ED Notes (Signed)
Patient placed back on monitoring minus oxygen

## 2017-12-23 NOTE — Progress Notes (Signed)
[facility]  ROUNDING NOTE   Subjective:   [name] admitted to [facility] on 12/22/2017 for Angina pectoris [facility]) [I20.9]  Last hemodialysis was Thursday.  Recent hospitalization from 3/21 to 3/22 for chest pain. She has ongoing workup with Cardiology.   Objective:  Vital signs in last 24 hours:  Temp:  [98 F (36.7 C)-98.7 F (37.1 C)] 98.2 F (36.8 C) (04/06 1117) Pulse Rate:  [73-92] 75 (04/06 1117) Resp:  [16-26] 19 (04/06 1117) BP: (129-164)/(54-105) 135/57 (04/06 1117) SpO2:  [93 %-98 %] 96 % (04/06 0944) Weight:  [99.8 kg (220 lb)-102.6 kg (226 lb 3.2 oz)] 102.6 kg (226 lb 3.2 oz) (04/06 0929)  Weight change:  Filed Weights   12/22/17 2236 12/23/17 0929  Weight: 99.8 kg (220 lb) 102.6 kg (226 lb 3.2 oz)    Intake/Output: I/O last 3 completed shifts: In: 150 [I.V.:150] Out: -    Intake/Output this shift:  Total I/O In: 240 [P.O.:240] Out: -   Physical Exam: General: NAD, laying in bed  Head: Normocephalic, atraumatic. Moist oral mucosal membranes  Eyes: Anicteric, PERRL  Neck: Supple, trachea midline  Lungs:  Clear to auscultation  Heart: Regular rate and rhythm  Abdomen:  Soft, nontender, obese  Extremities:  trace peripheral edema.  Neurologic: Nonfocal, moving all four extremities  Skin: No lesions  Access: Left AVF    Basic Metabolic Panel: Recent Labs  Lab 12/22/17 2258  NA 138  K 3.7  CL 103  CO2 25  GLUCOSE 179*  BUN 49*  CREATININE 7.48*  CALCIUM 8.3*    Liver Function Tests: No results for input(s): AST, ALT, ALKPHOS, BILITOT, PROT, ALBUMIN in the last 168 hours. No results for input(s): LIPASE, AMYLASE in the last 168 hours. No results for input(s): AMMONIA in the last 168 hours.  CBC: Recent Labs  Lab 12/22/17 2258  WBC 7.6  NEUTROABS 5.1  HGB 8.0*  HCT 25.1*  MCV 96.5  PLT 193    Cardiac Enzymes: Recent Labs  Lab 12/22/17 2258 12/23/17 0845  TROPONINI 0.05* 0.07*    BNP: Invalid input(s):  POCBNP  CBG: No results for input(s): GLUCAP in the last 168 hours.  Microbiology: Results for orders placed or performed during the hospital encounter of 12/07/17  MRSA PCR Screening     Status: None   Collection Time: 12/08/17  4:07 AM  Result Value Ref Range Status   MRSA by PCR NEGATIVE NEGATIVE Final    Comment:        The GeneXpert MRSA Assay (FDA approved for NASAL specimens only), is one component of a comprehensive MRSA colonization surveillance program. It is not intended to diagnose MRSA infection nor to guide or monitor treatment for MRSA infections. Performed at [facility] [address] [place], [place] [address]     Coagulation Studies: No results for input(s): LABPROT, INR in the last 72 hours.  Urinalysis: No results for input(s): COLORURINE, LABSPEC, PHURINE, GLUCOSEU, HGBUR, BILIRUBINUR, KETONESUR, PROTEINUR, UROBILINOGEN, NITRITE, LEUKOCYTESUR in the last 72 hours.  Invalid input(s): APPERANCEUR    Imaging: Dg Chest 1 View  Result Date: 12/22/2017 CLINICAL DATA:  Left-sided chest pain radiating to the neck and arm for several months. EXAM: CHEST  1 VIEW COMPARISON:  12/07/2017 FINDINGS: Stable cardiomegaly without acute pneumonic consolidation or CHF. Aortic atherosclerosis at the arch without aneurysm. Calcific tendinopathy or bursitis about the left shoulder. Minimal undersurface spurring off the distal clavicle at the [facility] joints. No acute osseous abnormality. IMPRESSION:  Aortic atherosclerosis with stable cardiomegaly. No active pulmonary disease. Left rotator cuff calcific tendinopathy or calcific bursitis noted. Electronically Signed   By: Ashley Royalty M.D.   On: 12/22/2017 22:57     Medications:    . amLODipine  10 mg Oral Daily  . aspirin EC  81 mg Oral Daily  . calcium carbonate  600 mg of elemental calcium Oral Q breakfast  . cinacalcet  30 mg Oral Daily  . docusate sodium  100 mg Oral BID  . folic acid  1 mg Oral Daily  .  gabapentin  100 mg Oral BH-q7a  . heparin  5,000 Units Subcutaneous Q8H  . metoprolol succinate  100 mg Oral Daily  . mometasone-formoterol  2 puff Inhalation BID  . pantoprazole  40 mg Oral BID  . rosuvastatin  20 mg Oral Daily  . sevelamer carbonate  2.4 g Oral BID WC  . tiotropium  1 capsule Inhalation Daily  . venlafaxine XR  75 mg Oral Daily   acetaminophen **OR** acetaminophen, HYDROcodone-acetaminophen, meclizine, methocarbamol, nitroGLYCERIN, ondansetron **OR** ondansetron (ZOFRAN) IV  Assessment/ Plan:  [name] is a 67 y.o. black female with end stage renal disease on hemodialysis, coronary artery disease status post CABG, hypertension, peripheral vascular disease, diabetes mellitus type 2, hyperlipidemia, bilateral total knee replacement, PD catheter removal June 30, 2017 Admitted for chest pain  [facility]/[address]TTS/left AVF/ 103kg  1.  ESRD on hemodialysis:  Dialysis scheduled for later today. Orders prepared.   2. Hypertension: 135/57 - home regimen of amlodipine, metoprolol, and benazepril.   3.  Anemia of chronic kidney disease: hemoglobin 8.  - EPO with HD treatment  4.  Secondary hyperparathyroidism: with hyperphosphatemia: phos 6.2, calcium 8.6 and PTH 277.  - Unable to tolerate Auryxia and sevelamer, will hold.  - Continue cinacalcet - TUMS for binding   LOS: 0 Chinmay Squier 4/6/201911:20 AM

## 2017-12-23 NOTE — Progress Notes (Signed)
Hd started  

## 2017-12-23 NOTE — Progress Notes (Signed)
[place] at [facility]                                                                                                                                                                                  Patient Demographics   Kimberly Shaffer, is a 67 y.o. female, DOB - July 19, 1951, VHQ:[id number]  Admit date - 12/22/2017   Admitting Physician Harrie Foreman, MD  Outpatient Primary MD for the patient is [place], [facility]   LOS - 0  Subjective: Patient continues to complain of intermittent chest pain. No shortness of breath   Review of Systems:   CONSTITUTIONAL: No documented fever. No fatigue, weakness. No weight gain, no weight loss.  EYES: No blurry or double vision.  ENT: No tinnitus. No postnasal drip. No redness of the oropharynx.  RESPIRATORY: No cough, no wheeze, no hemoptysis. No dyspnea.  CARDIOVASCULAR: Positive chest pain. No orthopnea. No palpitations. No syncope.  GASTROINTESTINAL: No nausea, no vomiting or diarrhea. No abdominal pain. No melena or hematochezia.  GENITOURINARY: No dysuria or hematuria.  ENDOCRINE: No polyuria or nocturia. No heat or cold intolerance.  HEMATOLOGY: No anemia. No bruising. No bleeding.  INTEGUMENTARY: No rashes. No lesions.  MUSCULOSKELETAL: No arthritis. No swelling. No gout.  NEUROLOGIC: No numbness, tingling, or ataxia. No seizure-type activity.  PSYCHIATRIC: No anxiety. No insomnia. No ADD.    Vitals:   Vitals:   12/23/17 0830 12/23/17 0929 12/23/17 0944 12/23/17 1117  BP: (!) 162/76  (!) 164/105 (!) 135/57  Pulse: 81  83 75  Resp: 17  18 19   Temp:   98.7 F (37.1 C) 98.2 F (36.8 C)  TempSrc:   Oral Oral  SpO2: 96%  96% 96%  Weight:  226 lb 3.2 oz (102.6 kg)    Height:  5\' 6"  (1.676 m)      Wt Readings from Last 3 Encounters:  12/23/17 226 lb 3.2 oz (102.6 kg)  12/08/17 236 lb 1.8 oz (107.1 kg)  11/16/17 227 lb (103 kg)     Intake/Output Summary (Last 24 hours) at 12/23/2017 1357 Last  data filed at 12/23/2017 1032 Gross per 24 hour  Intake 390 ml  Output -  Net 390 ml    Physical Exam:   GENERAL: Pleasant-appearing in no apparent distress.  HEAD, EYES, EARS, NOSE AND THROAT: Atraumatic, normocephalic. Extraocular muscles are intact. Pupils equal and reactive to light. Sclerae anicteric. No conjunctival injection. No oro-pharyngeal erythema.  NECK: Supple. There is no jugular venous distention. No bruits, no lymphadenopathy, no thyromegaly.  HEART: Regular rate and rhythm,. No murmurs, no rubs, no clicks.  LUNGS: Clear to auscultation bilaterally. No rales or rhonchi.  No wheezes.  ABDOMEN: Soft, flat, nontender, nondistended. Has good bowel sounds. No hepatosplenomegaly appreciated.  EXTREMITIES: No evidence of any cyanosis, clubbing, or peripheral edema.  +2 pedal and radial pulses bilaterally.  NEUROLOGIC: The patient is alert, awake, and oriented x3 with no focal motor or sensory deficits appreciated bilaterally.  SKIN: Moist and warm with no rashes appreciated.  Psych: Not anxious, depressed LN: No inguinal LN enlargement    Antibiotics   Anti-infectives (From admission, onward)   None      Medications   Scheduled Meds: . amLODipine  10 mg Oral Daily  . aspirin EC  81 mg Oral Daily  . calcium carbonate  600 mg of elemental calcium Oral Q breakfast  . cinacalcet  30 mg Oral Daily  . docusate sodium  100 mg Oral BID  . folic acid  1 mg Oral Daily  . gabapentin  100 mg Oral BH-q7a  . heparin  5,000 Units Subcutaneous Q8H  . metoprolol succinate  100 mg Oral Daily  . mometasone-formoterol  2 puff Inhalation BID  . nicotine  21 mg Transdermal Daily  . pantoprazole  40 mg Oral BID  . rosuvastatin  20 mg Oral Daily  . tiotropium  1 capsule Inhalation Daily  . venlafaxine XR  75 mg Oral Daily   Continuous Infusions: PRN Meds:.acetaminophen **OR** acetaminophen, HYDROcodone-acetaminophen, meclizine, methocarbamol, nitroGLYCERIN, ondansetron **OR**  ondansetron (ZOFRAN) IV   Data Review:   Micro Results No results found for this or any previous visit (from the past 240 hour(s)).  Radiology Reports Dg Chest 1 View  Result Date: 12/22/2017 CLINICAL DATA:  Left-sided chest pain radiating to the neck and arm for several months. EXAM: CHEST  1 VIEW COMPARISON:  12/07/2017 FINDINGS: Stable cardiomegaly without acute pneumonic consolidation or CHF. Aortic atherosclerosis at the arch without aneurysm. Calcific tendinopathy or bursitis about the left shoulder. Minimal undersurface spurring off the distal clavicle at the [facility] joints. No acute osseous abnormality. IMPRESSION: Aortic atherosclerosis with stable cardiomegaly. No active pulmonary disease. Left rotator cuff calcific tendinopathy or calcific bursitis noted. Electronically Signed   By: Ashley Royalty M.D.   On: 12/22/2017 22:57   Dg Chest Port 1 View  Result Date: 12/07/2017 CLINICAL DATA:  Shortness of breath.  Chest pain. EXAM: PORTABLE CHEST 1 VIEW COMPARISON:  08/27/2016 FINDINGS: Again seen cardiomegaly, unchanged mediastinal contours. There is central peribronchial thickening suspicious for pulmonary edema. No confluent airspace disease. No large pleural effusion. No pneumothorax. IMPRESSION: Cardiomegaly. Peribronchial cuffing suspicious for pulmonary edema, less likely bronchial inflammation/bronchitis. Electronically Signed   By: Jeb Levering M.D.   On: 12/07/2017 23:48     CBC Recent Labs  Lab 12/22/17 2258  WBC 7.6  HGB 8.0*  HCT 25.1*  PLT 193  MCV 96.5  MCH 30.9  MCHC 32.0  RDW 16.2*  LYMPHSABS 1.6  MONOABS 0.7  EOSABS 0.2  BASOSABS 0.0    Chemistries  Recent Labs  Lab 12/22/17 2258  NA 138  K 3.7  CL 103  CO2 25  GLUCOSE 179*  BUN 49*  CREATININE 7.48*  CALCIUM 8.3*   ------------------------------------------------------------------------------------------------------------------ estimated creatinine clearance is 8.8 mL/min (A) (by C-G formula based  on SCr of 7.48 mg/dL (H)). ------------------------------------------------------------------------------------------------------------------ No results for input(s): HGBA1C in the last 72 hours. ------------------------------------------------------------------------------------------------------------------ No results for input(s): CHOL, HDL, LDLCALC, TRIG, CHOLHDL, LDLDIRECT in the last 72 hours. ------------------------------------------------------------------------------------------------------------------ Recent Labs    12/23/17 0845  TSH 0.323*   ------------------------------------------------------------------------------------------------------------------ No results for input(s): VITAMINB12, FOLATE, FERRITIN,  TIBC, IRON, RETICCTPCT in the last 72 hours.  Coagulation profile No results for input(s): INR, PROTIME in the last 168 hours.  No results for input(s): DDIMER in the last 72 hours.  Cardiac Enzymes Recent Labs  Lab 12/22/17 2258 12/23/17 0845 12/23/17 1301  TROPONINI 0.05* 0.07* 0.07*   ------------------------------------------------------------------------------------------------------------------ Invalid input(s): POCBNP    Assessment & Plan   This is a 67 year old female admitted for chest pain. 1.  Chest pain: Appreciate cardiology input history of coronary artery disease that is medically managed.  Depending on her symptoms may need 2.  Hypertension: Controlled; continue amlodipine and metoprolol 3.  COPD: No shortness of breath associated with this current episode.  Continue inhaled corticosteroid.  Albuterol as needed. 4.  ESRD: On dialysis; continue Renvela and Sensipar 5.    Nicotine abuse smoking cessation provided 4 minutes spent strongly recommended patient stop smoking continue patch will be started 6.  GI prophylaxis: Pantoprazole per home regimen The patient is a full code.  Time spent on admission orders and patient        Code Status  Orders  (From admission, onward)        Start     Ordered   12/23/17 0928  Full code  Continuous     12/23/17 0927    Code Status History    Date Active Date Inactive Code Status Order ID Comments User Context   12/08/2017 0357 12/08/2017 2344 Full Code [id number]  Harrie Foreman, MD Inpatient   06/28/2017 0749 07/01/2017 2218 Full Code [id number]  Harrie Foreman, MD Inpatient   07/19/2016 0857 07/19/2016 1319 Full Code [id number]  Corey Skains, MD Inpatient   07/05/2016 2236 07/09/2016 1950 Full Code [id number]  Harvie Bridge, DO Inpatient   12/17/2015 5790 12/20/2015 1957 Full Code [id number]  Saundra Shelling, MD Inpatient           Consults cardiology  DVT Prophylaxis  Lovenox  Lab Results  Component Value Date   PLT 193 12/22/2017     Time Spent in minutes   35 minutes greater than 50% of time spent in care coordination and counseling patient regarding the condition and plan of care.   Dustin Flock M.D on 12/23/2017 at 1:57 PM  Between 7am to 6pm - Pager - [phone]  After 6pm go to [email or web address] - Proofreader  [employer]   Office  [phone]

## 2017-12-23 NOTE — Progress Notes (Signed)
This note also relates to the following rows which could not be included: Pulse Rate - Cannot attach notes to unvalidated device data Resp - Cannot attach notes to unvalidated device data  Hd completed  

## 2017-12-23 NOTE — ED Notes (Signed)
Patient co increased chest pain, patient is slightly dyspneic and was placed on 2L [place].  EKG obtained for MD.

## 2017-12-23 NOTE — Progress Notes (Signed)
Post dialysis assessment 

## 2017-12-23 NOTE — Progress Notes (Signed)
Pre dialysis assessment 

## 2017-12-24 ENCOUNTER — Observation Stay
Admit: 2017-12-24 | Discharge: 2017-12-24 | Disposition: A | Discharge status: Home / Self Care | Payer: Medicare HMO | Attending: Cardiology | Admitting: Cardiology

## 2017-12-24 DIAGNOSIS — I25728 Atherosclerosis of autologous artery coronary artery bypass graft(s) with other forms of angina pectoris: Secondary | ICD-10-CM | POA: Diagnosis not present

## 2017-12-24 MED ORDER — IPRATROPIUM BROMIDE 0.02 % IN SOLN: 0.5000 mg | Freq: Four times a day (QID) | RESPIRATORY_TRACT | 5 refills | Status: DC | PRN

## 2017-12-24 MED ORDER — NITROGLYCERIN 0.4 MG/SPRAY TL SOLN
1.0000 | Status: DC | PRN
  Filled 2017-12-24: qty 4.9

## 2017-12-24 MED ORDER — NICOTINE 21 MG/24HR TD PT24: 21.0000 mg | MEDICATED_PATCH | Freq: Every day | TRANSDERMAL | 0 refills | Status: DC

## 2017-12-24 MED ORDER — SODIUM CHLORIDE 0.9% FLUSH: 3.0000 mL | INTRAVENOUS | Status: DC | PRN

## 2017-12-24 MED ORDER — RENA-VITE PO TABS
1.0000 | ORAL_TABLET | Freq: Every day | ORAL | Status: DC
  Administered 2017-12-24: 1 via ORAL
  Filled 2017-12-24 (×2): qty 1

## 2017-12-24 MED ORDER — NEPRO/CARBSTEADY PO LIQD
237.0000 mL | ORAL | Status: DC
  Administered 2017-12-24: 237 mL via ORAL

## 2017-12-24 MED ORDER — NICOTINE 21 MG/24HR TD PT24
21.0000 mg | MEDICATED_PATCH | Freq: Every day | TRANSDERMAL | Status: DC
  Administered 2017-12-25: 21 mg via TRANSDERMAL
  Filled 2017-12-24: qty 1

## 2017-12-24 MED ORDER — SODIUM CHLORIDE 0.9% FLUSH
3.0000 mL | Freq: Two times a day (BID) | INTRAVENOUS | Status: DC
  Administered 2017-12-24: 3 mL via INTRAVENOUS

## 2017-12-24 MED ORDER — SODIUM CHLORIDE 0.9 % IV SOLN
INTRAVENOUS | Status: DC
  Administered 2017-12-25: 06:00:00 via INTRAVENOUS

## 2017-12-24 MED ORDER — ASPIRIN 81 MG PO CHEW
81.0000 mg | CHEWABLE_TABLET | ORAL | Status: AC
  Administered 2017-12-25: 81 mg via ORAL
  Filled 2017-12-24: qty 1

## 2017-12-24 MED ORDER — SODIUM CHLORIDE 0.9 % IV SOLN: 250.0000 mL | INTRAVENOUS | Status: DC | PRN

## 2017-12-24 NOTE — Progress Notes (Signed)
Initial Nutrition Assessment  DOCUMENTATION CODES:   Obesity unspecified  INTERVENTION:  Provide Nepro Shake po once daily, each supplement provides 425 kcal and 19 grams protein.  Provide Rena-vite QHS.  NUTRITION DIAGNOSIS:   Increased nutrient needs related to chronic illness(ESRD on HD, COPD) as evidenced by estimated needs.  GOAL:   Patient will meet greater than or equal to 90% of their needs  MONITOR:   PO intake, Supplement acceptance, Labs, Weight trends, I & O's  REASON FOR ASSESSMENT:   Malnutrition Screening Tool    ASSESSMENT:   67 year old female with PMHx of COPD, HTN, depression, hx CVA, CAD, hx MI, DM type 2, ESRD on HD who is admitted with chest pain.   Met with patient at bedside. She reports she has had a decreased appetite for years now, even before she started dialysis. She has variable intake. Some days she can eat well at her meals but some days she may only have 4-5 bites. She tries to eat meat at each meal (pork, fish, occasional steak). She occasionally has a protein shake at dialysis but she cannot remember which one it is. Patient denies any abdominal pain, N/V, difficulty chewing/swallowing.  Patient reports her UBW was 290 lbs 2 years ago. Per chart she was 230 lbs 2 years ago and has remained fairly weight-stable. Patient reports she cannot remember what her dry weight is.  Meal Completion: 25-75% yesterday; 100% of breakfast this morning  Medications reviewed and include: calcium carbonate with meals, Sensipar with meals, Colace, folic acid 1 mg daily, pantoprazole.  Labs reviewed: BUN 49, Creatinine 7.48.  Patient does not meet criteria for malnutrition at this time.  NUTRITION - FOCUSED PHYSICAL EXAM:    Most Recent Value  Orbital Region  No depletion  Upper Arm Region  No depletion  Thoracic and Lumbar Region  No depletion  Buccal Region  No depletion  Temple Region  No depletion  Clavicle Bone Region  No depletion  Clavicle and  Acromion Bone Region  No depletion  Scapular Bone Region  No depletion  Dorsal Hand  No depletion  Patellar Region  No depletion  Anterior Thigh Region  No depletion  Posterior Calf Region  No depletion  Edema (RD Assessment)  None  Hair  Reviewed  Eyes  Reviewed  Mouth  Reviewed  Skin  Reviewed  Nails  Reviewed     Diet Order:  Diet renal with fluid restriction Fluid restriction: 1200 mL Fluid; Room service appropriate? Yes; Fluid consistency: Thin Diet general Diet NPO time specified Diet NPO time specified Except for: Sips with Meds  EDUCATION NEEDS:   No education needs have been identified at this time  Skin:  Skin Assessment: Reviewed RN Assessment  Last BM:  12/23/2017 per chart (no BM characteristics documented)  Height:   Ht Readings from Last 1 Encounters:  12/23/17 '5\' 6"'$  (1.676 m)    Weight:   Wt Readings from Last 1 Encounters:  12/24/17 228 lb 4.8 oz (103.6 kg)    Ideal Body Weight:  59.1 kg  BMI:  Body mass index is 36.85 kg/m.  Estimated Nutritional Needs:   Kcal:  1910-2100 (MSJ x 1.2-1.3)  Protein:  95-110 grams (0.9-1.1 grams/kg)  Fluid:  UOP + 1L  Willey Blade, MS, RD, LDN Office: [phone] Pager: [phone] After Hours/Weekend Pager: [phone]

## 2017-12-24 NOTE — Progress Notes (Signed)
Pt ambulated around nursing station/ pt c/o " something not feeling right with her heart"/ no complaints of chest pain/ SOB noted/ VSS/ no changes on tele/ Dr. Ubaldo Glassing made aware/ plans to cath pt tomorrow/ Dr. Posey Pronto made aware/ will continue to assess

## 2017-12-24 NOTE — Care Management Obs Status (Signed)
[facility] NOTIFICATION   Patient Details  Name: DEONNA KRUMMEL MRN: [id number] Date of Birth: [birth year]   Medicare Observation Status Notification Given:  Yes    Katrina Stack, RN 12/24/2017, 5:07 PM

## 2017-12-24 NOTE — Progress Notes (Signed)
Patient is complaining of chest pain 9/10. Patient was awaken from sleep by the pain. Patient refused to the hospital issued sublingual nitroglycerin. Patient has taken her sublingual nitroglycerin spray. Will have pharmacy verified to use while in the hospital. Chest pain relieved after 1 spray. Will continue to monitor and assess.

## 2017-12-24 NOTE — Progress Notes (Addendum)
Patient Name: Kimberly Shaffer Date of Encounter: 12/24/2017  Hospital Problem List     Active Problems:   Chest pain    Patient Profile     67 year old female with history of coronary disease and chest pain as well as end-stage renal disease on hemodialysis.  Subjective   Doing well this morning.  Did well with dialysis yesterday.  Had brief episode of chest pain last p.m.  Troponin remains minimally elevated at 0.06.  EKG shows no change from admission. Inpatient Medications    . amLODipine  10 mg Oral Daily  . aspirin EC  81 mg Oral Daily  . calcium carbonate  600 mg of elemental calcium Oral Q breakfast  . cinacalcet  30 mg Oral Daily  . docusate sodium  100 mg Oral BID  . folic acid  1 mg Oral Daily  . gabapentin  100 mg Oral BH-q7a  . heparin  5,000 Units Subcutaneous Q8H  . metoprolol succinate  100 mg Oral Daily  . mometasone-formoterol  2 puff Inhalation BID  . nicotine  21 mg Transdermal Daily  . pantoprazole  40 mg Oral BID  . rosuvastatin  20 mg Oral Daily  . sodium chloride flush  3 mL Intravenous Q12H  . tiotropium  1 capsule Inhalation Daily  . venlafaxine XR  75 mg Oral Daily    Vital Signs    Vitals:   12/24/17 0034 12/24/17 0044 12/24/17 0432 12/24/17 0745  BP: (!) 161/73 (!) 154/79 (!) 116/99 (!) 138/99  Pulse: 93 (!) 101 84 92  Resp:   16 18  Temp:   98.4 F (36.9 C)   TempSrc:   Oral   SpO2: 92% 91% 92% 96%  Weight:   103.6 kg (228 lb 4.8 oz)   Height:        Intake/Output Summary (Last 24 hours) at 12/24/2017 1045 Last data filed at 12/24/2017 1003 Gross per 24 hour  Intake 600 ml  Output 1900 ml  Net -1300 ml   Filed Weights   12/23/17 0929 12/23/17 1926 12/24/17 0432  Weight: 102.6 kg (226 lb 3.2 oz) 103.1 kg (227 lb 3.2 oz) 103.6 kg (228 lb 4.8 oz)    Physical Exam    GEN: Well nourished, well developed, in no acute distress.  HEENT: normal.  Neck: Supple, no JVD, carotid bruits, or masses. Cardiac: RRR, no murmurs, rubs, or  gallops. No clubbing, cyanosis, edema.  Radials/DP/PT 2+ and equal bilaterally.  Respiratory:  Respirations regular and unlabored, clear to auscultation bilaterally. GI: Soft, nontender, nondistended, BS + x 4. MS: no deformity or atrophy. Skin: warm and dry, no rash. Neuro:  Strength and sensation are intact. Psych: Normal affect.  Labs    CBC Recent Labs    12/22/17 2258  WBC 7.6  NEUTROABS 5.1  HGB 8.0*  HCT 25.1*  MCV 96.5  PLT 062   Basic Metabolic Panel Recent Labs    12/22/17 2258  NA 138  K 3.7  CL 103  CO2 25  GLUCOSE 179*  BUN 49*  CREATININE 7.48*  CALCIUM 8.3*   Liver Function Tests No results for input(s): AST, ALT, ALKPHOS, BILITOT, PROT, ALBUMIN in the last 72 hours. No results for input(s): LIPASE, AMYLASE in the last 72 hours. Cardiac Enzymes Recent Labs    12/23/17 0845 12/23/17 1301 12/23/17 1900  TROPONINI 0.07* 0.07* 0.06*   BNP No results for input(s): BNP in the last 72 hours. D-Dimer No results for input(s): DDIMER in  the last 72 hours. Hemoglobin A1C No results for input(s): HGBA1C in the last 72 hours. Fasting Lipid Panel No results for input(s): CHOL, HDL, LDLCALC, TRIG, CHOLHDL, LDLDIRECT in the last 72 hours. Thyroid Function Tests Recent Labs    12/23/17 0845  TSH 0.323*    Telemetry    Sinus rhythm with no tachycardia or bradycardia arrhythmias.  ECG    Sinus rhythm with T wave inversion in inferolateral leads.  Radiology    Dg Chest 1 View  Result Date: 12/22/2017 CLINICAL DATA:  Left-sided chest pain radiating to the neck and arm for several months. EXAM: CHEST  1 VIEW COMPARISON:  12/07/2017 FINDINGS: Stable cardiomegaly without acute pneumonic consolidation or CHF. Aortic atherosclerosis at the arch without aneurysm. Calcific tendinopathy or bursitis about the left shoulder. Minimal undersurface spurring off the distal clavicle at the [facility] joints. No acute osseous abnormality. IMPRESSION: Aortic atherosclerosis  with stable cardiomegaly. No active pulmonary disease. Left rotator cuff calcific tendinopathy or calcific bursitis noted. Electronically Signed   By: Ashley Royalty M.D.   On: 12/22/2017 22:57   Dg Chest Port 1 View  Result Date: 12/07/2017 CLINICAL DATA:  Shortness of breath.  Chest pain. EXAM: PORTABLE CHEST 1 VIEW COMPARISON:  08/27/2016 FINDINGS: Again seen cardiomegaly, unchanged mediastinal contours. There is central peribronchial thickening suspicious for pulmonary edema. No confluent airspace disease. No large pleural effusion. No pneumothorax. IMPRESSION: Cardiomegaly. Peribronchial cuffing suspicious for pulmonary edema, less likely bronchial inflammation/bronchitis. Electronically Signed   By: Jeb Levering M.D.   On: 12/07/2017 23:48    Assessment & Plan    67 year old female with history of coronary disease and end-stage renal disease on hemodialysis.  Admitted with chest pain with atypical features.  Had a borderline troponin elevation which is remained stable at 0.7-0.06.  Pain is atypical for her angina.  She is hemodynamically stable.  Tolerated dialysis well yesterday.  We will continue with current regimen including amlodipine at 10 mg daily, metoprolol succinate at 100 mg daily, as needed nitrates, rosuvastatin 20 mg daily and would ambulate today and if stable consider discharge.  If there is further chest pain, would need to proceed with further ischemic evaluation.  Signed, Kimberly Docker Chandlar Guice MD 12/24/2017, 10:45 AM  Pager: [phone]   Addendum:  Patient complains of chest pain with ambulation.  Somewhat atypical however patient is concerned that she has a "blockage".  We will proceed with left heart cardiac catheter in the morning to evaluate anatomy.

## 2017-12-24 NOTE — Progress Notes (Signed)
[place] at [facility]                                                                                                                                                                                  Patient Demographics   Kimberly Shaffer, is a 67 y.o. female, DOB - [birth year], OZD:[id number]  Admit date - 12/22/2017   Admitting Physician Harrie Foreman, MD  Outpatient Primary MD for the patient is [place], [facility]   LOS - 0  Subjective: Patient was ambulated and continue complaint of chest pain  Review of Systems:   CONSTITUTIONAL: No documented fever. No fatigue, weakness. No weight gain, no weight loss.  EYES: No blurry or double vision.  ENT: No tinnitus. No postnasal drip. No redness of the oropharynx.  RESPIRATORY: No cough, no wheeze, no hemoptysis. No dyspnea.  CARDIOVASCULAR: Positive chest pain. No orthopnea. No palpitations. No syncope.  GASTROINTESTINAL: No nausea, no vomiting or diarrhea. No abdominal pain. No melena or hematochezia.  GENITOURINARY: No dysuria or hematuria.  ENDOCRINE: No polyuria or nocturia. No heat or cold intolerance.  HEMATOLOGY: No anemia. No bruising. No bleeding.  INTEGUMENTARY: No rashes. No lesions.  MUSCULOSKELETAL: No arthritis. No swelling. No gout.  NEUROLOGIC: No numbness, tingling, or ataxia. No seizure-type activity.  PSYCHIATRIC: No anxiety. No insomnia. No ADD.    Vitals:   Vitals:   12/24/17 0034 12/24/17 0044 12/24/17 0432 12/24/17 0745  BP: (!) 161/73 (!) 154/79 (!) 116/99 (!) 138/99  Pulse: 93 (!) 101 84 92  Resp:   16 18  Temp:   98.4 F (36.9 C)   TempSrc:   Oral   SpO2: 92% 91% 92% 96%  Weight:   228 lb 4.8 oz (103.6 kg)   Height:        Wt Readings from Last 3 Encounters:  12/24/17 228 lb 4.8 oz (103.6 kg)  12/08/17 236 lb 1.8 oz (107.1 kg)  11/16/17 227 lb (103 kg)     Intake/Output Summary (Last 24 hours) at 12/24/2017 1400 Last data filed at 12/24/2017 1003 Gross per 24  hour  Intake 600 ml  Output 1900 ml  Net -1300 ml    Physical Exam:   GENERAL: Pleasant-appearing in no apparent distress.  HEAD, EYES, EARS, NOSE AND THROAT: Atraumatic, normocephalic. Extraocular muscles are intact. Pupils equal and reactive to light. Sclerae anicteric. No conjunctival injection. No oro-pharyngeal erythema.  NECK: Supple. There is no jugular venous distention. No bruits, no lymphadenopathy, no thyromegaly.  HEART: Regular rate and rhythm,. No murmurs, no rubs, no clicks.  LUNGS: Clear to auscultation bilaterally. No rales or rhonchi. No wheezes.  ABDOMEN: Soft, flat, nontender,  nondistended. Has good bowel sounds. No hepatosplenomegaly appreciated.  EXTREMITIES: No evidence of any cyanosis, clubbing, or peripheral edema.  +2 pedal and radial pulses bilaterally.  NEUROLOGIC: The patient is alert, awake, and oriented x3 with no focal motor or sensory deficits appreciated bilaterally.  SKIN: Moist and warm with no rashes appreciated.  Psych: Not anxious, depressed LN: No inguinal LN enlargement    Antibiotics   Anti-infectives (From admission, onward)   None      Medications   Scheduled Meds: . amLODipine  10 mg Oral Daily  . aspirin EC  81 mg Oral Daily  . calcium carbonate  600 mg of elemental calcium Oral Q breakfast  . cinacalcet  30 mg Oral Daily  . docusate sodium  100 mg Oral BID  . folic acid  1 mg Oral Daily  . gabapentin  100 mg Oral BH-q7a  . heparin  5,000 Units Subcutaneous Q8H  . metoprolol succinate  100 mg Oral Daily  . mometasone-formoterol  2 puff Inhalation BID  . nicotine  21 mg Transdermal Daily  . pantoprazole  40 mg Oral BID  . rosuvastatin  20 mg Oral Daily  . sodium chloride flush  3 mL Intravenous Q12H  . tiotropium  1 capsule Inhalation Daily  . venlafaxine XR  75 mg Oral Daily   Continuous Infusions: PRN Meds:.acetaminophen **OR** acetaminophen, HYDROcodone-acetaminophen, meclizine, methocarbamol, nitroGLYCERIN,  nitroGLYCERIN, ondansetron **OR** ondansetron (ZOFRAN) IV   Data Review:   Micro Results No results found for this or any previous visit (from the past 240 hour(s)).  Radiology Reports Dg Chest 1 View  Result Date: 12/22/2017 CLINICAL DATA:  Left-sided chest pain radiating to the neck and arm for several months. EXAM: CHEST  1 VIEW COMPARISON:  12/07/2017 FINDINGS: Stable cardiomegaly without acute pneumonic consolidation or CHF. Aortic atherosclerosis at the arch without aneurysm. Calcific tendinopathy or bursitis about the left shoulder. Minimal undersurface spurring off the distal clavicle at the [facility] joints. No acute osseous abnormality. IMPRESSION: Aortic atherosclerosis with stable cardiomegaly. No active pulmonary disease. Left rotator cuff calcific tendinopathy or calcific bursitis noted. Electronically Signed   By: Ashley Royalty M.D.   On: 12/22/2017 22:57   Dg Chest Port 1 View  Result Date: 12/07/2017 CLINICAL DATA:  Shortness of breath.  Chest pain. EXAM: PORTABLE CHEST 1 VIEW COMPARISON:  08/27/2016 FINDINGS: Again seen cardiomegaly, unchanged mediastinal contours. There is central peribronchial thickening suspicious for pulmonary edema. No confluent airspace disease. No large pleural effusion. No pneumothorax. IMPRESSION: Cardiomegaly. Peribronchial cuffing suspicious for pulmonary edema, less likely bronchial inflammation/bronchitis. Electronically Signed   By: Jeb Levering M.D.   On: 12/07/2017 23:48     CBC Recent Labs  Lab 12/22/17 2258  WBC 7.6  HGB 8.0*  HCT 25.1*  PLT 193  MCV 96.5  MCH 30.9  MCHC 32.0  RDW 16.2*  LYMPHSABS 1.6  MONOABS 0.7  EOSABS 0.2  BASOSABS 0.0    Chemistries  Recent Labs  Lab 12/22/17 2258  NA 138  K 3.7  CL 103  CO2 25  GLUCOSE 179*  BUN 49*  CREATININE 7.48*  CALCIUM 8.3*   ------------------------------------------------------------------------------------------------------------------ estimated creatinine clearance is  8.9 mL/min (A) (by C-G formula based on SCr of 7.48 mg/dL (H)). ------------------------------------------------------------------------------------------------------------------ No results for input(s): HGBA1C in the last 72 hours. ------------------------------------------------------------------------------------------------------------------ No results for input(s): CHOL, HDL, LDLCALC, TRIG, CHOLHDL, LDLDIRECT in the last 72 hours. ------------------------------------------------------------------------------------------------------------------ Recent Labs    12/23/17 0845  TSH 0.323*   ------------------------------------------------------------------------------------------------------------------ No results for  input(s): VITAMINB12, FOLATE, FERRITIN, TIBC, IRON, RETICCTPCT in the last 72 hours.  Coagulation profile No results for input(s): INR, PROTIME in the last 168 hours.  No results for input(s): DDIMER in the last 72 hours.  Cardiac Enzymes Recent Labs  Lab 12/23/17 0845 12/23/17 1301 12/23/17 1900  TROPONINI 0.07* 0.07* 0.06*   ------------------------------------------------------------------------------------------------------------------ Invalid input(s): POCBNP    Assessment & Plan   This is a 67 year old female admitted for chest pain. 1.  Chest pain: Appreciate cardiology input history of coronary artery disease that is medically managed.   Continues to be symptomatic, cardiology will be cath him tomorrow continue aspirin 2.  Hypertension: Controlled; continue amlodipine and metoprolol 3.  COPD: No shortness of breath associated with this current episode.  Continue inhaled corticosteroid.  Albuterol as needed. 4.  ESRD: On dialysis; continue Renvela and Sensipar 5.    Nicotine abuse smoking cessation provided 4 minutes spent strongly recommended patient stop smoking continue patch will be started 6.  GI prophylaxis: Pantoprazole per home regimen The patient  is a full code.  Time spent on admission orders and patient        Code Status Orders  (From admission, onward)        Start     Ordered   12/23/17 0928  Full code  Continuous     12/23/17 0927    Code Status History    Date Active Date Inactive Code Status Order ID Comments User Context   12/08/2017 0357 12/08/2017 2344 Full Code [id number]  Harrie Foreman, MD Inpatient   06/28/2017 0749 07/01/2017 2218 Full Code [id number]  Harrie Foreman, MD Inpatient   07/19/2016 0857 07/19/2016 1319 Full Code [id number]  Corey Skains, MD Inpatient   07/05/2016 2236 07/09/2016 1950 Full Code [id number]  Harvie Bridge, DO Inpatient   12/17/2015 6295 12/20/2015 1957 Full Code [id number]  Saundra Shelling, MD Inpatient           Consults cardiology  DVT Prophylaxis  Lovenox  Lab Results  Component Value Date   PLT 193 12/22/2017     Time Spent in minutes   35 minutes greater than 50% of time spent in care coordination and counseling patient regarding the condition and plan of care.   Dustin Flock M.D on 12/24/2017 at 2:00 PM  Between 7am to 6pm - Pager - [phone]  After 6pm go to [email or web address] - Proofreader  [employer]   Office  [phone]

## 2017-12-24 NOTE — Progress Notes (Signed)
[facility]  ROUNDING NOTE   Subjective:   Hemodialysis treatment yesterday. Tolerated treatment well. UF of 1520mL.   Reports no chest pain this morning.   Objective:  Vital signs in last 24 hours:  Temp:  [98.2 F (36.8 C)-98.5 F (36.9 C)] 98.4 F (36.9 C) (04/07 0432) Pulse Rate:  [75-101] 92 (04/07 0745) Resp:  [13-25] 18 (04/07 0745) BP: (116-176)/(69-99) 138/99 (04/07 0745) SpO2:  [91 %-99 %] 96 % (04/07 0745) Weight:  [103.1 kg (227 lb 3.2 oz)-103.6 kg (228 lb 4.8 oz)] 103.6 kg (228 lb 4.8 oz) (04/07 0432)  Weight change: 2.812 kg (6 lb 3.2 oz) Filed Weights   12/23/17 0929 12/23/17 1926 12/24/17 0432  Weight: 102.6 kg (226 lb 3.2 oz) 103.1 kg (227 lb 3.2 oz) 103.6 kg (228 lb 4.8 oz)    Intake/Output: I/O last 3 completed shifts: In: 7 [P.O.:480; I.V.:150] Out: 1900 [Urine:400; Other:1500]   Intake/Output this shift:  Total I/O In: 360 [P.O.:360] Out: -   Physical Exam: General: NAD, laying in bed  Head: Normocephalic, atraumatic. Moist oral mucosal membranes  Eyes: Anicteric, PERRL  Neck: Supple, trachea midline  Lungs:  Clear to auscultation  Heart: Regular rate and rhythm  Abdomen:  Soft, nontender, obese  Extremities:  no peripheral edema.  Neurologic: Nonfocal, moving all four extremities  Skin: No lesions  Access: Left AVF    Basic Metabolic Panel: Recent Labs  Lab 12/22/17 2258  NA 138  K 3.7  CL 103  CO2 25  GLUCOSE 179*  BUN 49*  CREATININE 7.48*  CALCIUM 8.3*    Liver Function Tests: No results for input(s): AST, ALT, ALKPHOS, BILITOT, PROT, ALBUMIN in the last 168 hours. No results for input(s): LIPASE, AMYLASE in the last 168 hours. No results for input(s): AMMONIA in the last 168 hours.  CBC: Recent Labs  Lab 12/22/17 2258  WBC 7.6  NEUTROABS 5.1  HGB 8.0*  HCT 25.1*  MCV 96.5  PLT 193    Cardiac Enzymes: Recent Labs  Lab 12/22/17 2258 12/23/17 0845 12/23/17 1301 12/23/17 1900  TROPONINI  0.05* 0.07* 0.07* 0.06*    BNP: Invalid input(s): POCBNP  CBG: No results for input(s): GLUCAP in the last 168 hours.  Microbiology: Results for orders placed or performed during the hospital encounter of 12/07/17  MRSA PCR Screening     Status: None   Collection Time: 12/08/17  4:07 AM  Result Value Ref Range Status   MRSA by PCR NEGATIVE NEGATIVE Final    Comment:        The GeneXpert MRSA Assay (FDA approved for NASAL specimens only), is one component of a comprehensive MRSA colonization surveillance program. It is not intended to diagnose MRSA infection nor to guide or monitor treatment for MRSA infections. Performed at [facility], [address] [place], [place] [address]     Coagulation Studies: No results for input(s): LABPROT, INR in the last 72 hours.  Urinalysis: No results for input(s): COLORURINE, LABSPEC, PHURINE, GLUCOSEU, HGBUR, BILIRUBINUR, KETONESUR, PROTEINUR, UROBILINOGEN, NITRITE, LEUKOCYTESUR in the last 72 hours.  Invalid input(s): APPERANCEUR    Imaging: Dg Chest 1 View  Result Date: 12/22/2017 CLINICAL DATA:  Left-sided chest pain radiating to the neck and arm for several months. EXAM: CHEST  1 VIEW COMPARISON:  12/07/2017 FINDINGS: Stable cardiomegaly without acute pneumonic consolidation or CHF. Aortic atherosclerosis at the arch without aneurysm. Calcific tendinopathy or bursitis about the left shoulder. Minimal undersurface spurring off the distal clavicle at the  AC joints. No acute osseous abnormality. IMPRESSION: Aortic atherosclerosis with stable cardiomegaly. No active pulmonary disease. Left rotator cuff calcific tendinopathy or calcific bursitis noted. Electronically Signed   By: Ashley Royalty M.D.   On: 12/22/2017 22:57     Medications:    . amLODipine  10 mg Oral Daily  . aspirin EC  81 mg Oral Daily  . calcium carbonate  600 mg of elemental calcium Oral Q breakfast  . cinacalcet  30 mg Oral Daily  . docusate sodium   100 mg Oral BID  . folic acid  1 mg Oral Daily  . gabapentin  100 mg Oral BH-q7a  . heparin  5,000 Units Subcutaneous Q8H  . metoprolol succinate  100 mg Oral Daily  . mometasone-formoterol  2 puff Inhalation BID  . nicotine  21 mg Transdermal Daily  . pantoprazole  40 mg Oral BID  . rosuvastatin  20 mg Oral Daily  . sodium chloride flush  3 mL Intravenous Q12H  . tiotropium  1 capsule Inhalation Daily  . venlafaxine XR  75 mg Oral Daily   acetaminophen **OR** acetaminophen, HYDROcodone-acetaminophen, meclizine, methocarbamol, nitroGLYCERIN, nitroGLYCERIN, ondansetron **OR** ondansetron (ZOFRAN) IV  Assessment/ Plan:  Kimberly Shaffer is a 67 y.o. black female with end stage renal disease on hemodialysis, coronary artery disease status post CABG, hypertension, peripheral vascular disease, diabetes mellitus type 2, hyperlipidemia, bilateral total knee replacement, PD catheter removal June 30, 2017 Admitted for chest pain  [facility]TTS/left AVF/ 103kg  1.  ESRD on hemodialysis:  Hemodialysis yesterday. Tolerated treatment well. UF of 1542mL.  Continue TTS schedule.   2. Hypertension: with chest pain and elevated cardiac enzymes - home regimen of amlodipine, metoprolol, and benazepril.  - Appreciate cardiology input.   3.  Anemia of chronic kidney disease: hemoglobin 8.  - EPO with HD treatment  4.  Secondary hyperparathyroidism: with hyperphosphatemia: phos 6.2, calcium 8.6 and PTH 277.  - Unable to tolerate Auryxia and sevelamer, will hold.  - Continue cinacalcet - TUMS for binding   LOS: 0 Kimberly Shaffer 4/7/201911:50 AM

## 2017-12-25 ENCOUNTER — Encounter
Admission: EM | Disposition: A | Discharge status: Home / Self Care | Payer: Self-pay | Source: Home / Self Care | Attending: Internal Medicine

## 2017-12-25 DIAGNOSIS — I25728 Atherosclerosis of autologous artery coronary artery bypass graft(s) with other forms of angina pectoris: Secondary | ICD-10-CM | POA: Diagnosis not present

## 2017-12-25 HISTORY — PX: LEFT HEART CATH AND CORONARY ANGIOGRAPHY: CATH118249

## 2017-12-25 LAB — ECHOCARDIOGRAM COMPLETE
Height: 66 in
Weight: 3652.8 oz

## 2017-12-25 LAB — GLUCOSE, CAPILLARY: Glucose-Capillary: 114 mg/dL — ABNORMAL HIGH (ref 65–99)

## 2017-12-25 SURGERY — LEFT HEART CATH AND CORONARY ANGIOGRAPHY
Anesthesia: Moderate Sedation

## 2017-12-25 MED ORDER — MIDAZOLAM HCL 2 MG/2ML IJ SOLN
INTRAMUSCULAR | Status: AC
  Filled 2017-12-25: qty 2

## 2017-12-25 MED ORDER — MIDAZOLAM HCL 2 MG/2ML IJ SOLN
INTRAMUSCULAR | Status: DC | PRN
  Administered 2017-12-25: 1 mg via INTRAVENOUS

## 2017-12-25 MED ORDER — SODIUM CHLORIDE 0.9 % IV SOLN: 250.0000 mL | INTRAVENOUS | Status: DC | PRN

## 2017-12-25 MED ORDER — ACETAMINOPHEN 325 MG PO TABS: 650.0000 mg | ORAL_TABLET | ORAL | Status: DC | PRN

## 2017-12-25 MED ORDER — ONDANSETRON HCL 4 MG/2ML IJ SOLN: 4.0000 mg | Freq: Four times a day (QID) | INTRAMUSCULAR | Status: DC | PRN

## 2017-12-25 MED ORDER — IOPAMIDOL (ISOVUE-300) INJECTION 61%
INTRAVENOUS | Status: DC | PRN
  Administered 2017-12-25: 90 mL via INTRA_ARTERIAL

## 2017-12-25 MED ORDER — SODIUM CHLORIDE 0.9% FLUSH
3.0000 mL | Freq: Two times a day (BID) | INTRAVENOUS | Status: DC
  Administered 2017-12-25: 3 mL via INTRAVENOUS

## 2017-12-25 MED ORDER — SODIUM CHLORIDE 0.9% FLUSH: 3.0000 mL | INTRAVENOUS | Status: DC | PRN

## 2017-12-25 MED ORDER — FENTANYL CITRATE (PF) 100 MCG/2ML IJ SOLN
INTRAMUSCULAR | Status: DC | PRN
  Administered 2017-12-25: 50 ug via INTRAVENOUS

## 2017-12-25 MED ORDER — FENTANYL CITRATE (PF) 100 MCG/2ML IJ SOLN
INTRAMUSCULAR | Status: AC
  Filled 2017-12-25: qty 2

## 2017-12-25 MED ORDER — HEPARIN (PORCINE) IN NACL 2-0.9 UNIT/ML-% IJ SOLN
INTRAMUSCULAR | Status: AC
  Filled 2017-12-25: qty 1000

## 2017-12-25 SURGICAL SUPPLY — 10 items
CANNULA 5F STIFF (CANNULA) ×3 IMPLANT
CATH INFINITI 5FR ANG PIGTAIL (CATHETERS) ×3 IMPLANT
CATH INFINITI 5FR JL4 (CATHETERS) ×3 IMPLANT
CATH INFINITI 5FR JL5 (CATHETERS) ×3 IMPLANT
CATH INFINITI JR4 5F (CATHETERS) ×3 IMPLANT
KIT MANI 3VAL PERCEP (MISCELLANEOUS) ×3 IMPLANT
NEEDLE PERC 18GX7CM (NEEDLE) ×3 IMPLANT
PACK CARDIAC CATH (CUSTOM PROCEDURE TRAY) ×3 IMPLANT
SHEATH AVANTI 5FR X 11CM (SHEATH) ×3 IMPLANT
WIRE GUIDERIGHT .035X150 (WIRE) ×3 IMPLANT

## 2017-12-25 NOTE — Discharge Summary (Signed)
[place] at [facility], 67 y.o., DOB 06-Jun-1951, MRN [id number]. Admission date: 12/22/2017 Discharge Date 12/25/2017 Primary MD [facility] Admitting Physician Harrie Foreman, MD  Admission Diagnosis  Angina pectoris [facility]) [I20.9]  Discharge Diagnosis   Active Problems:   Chest pain due to unstable angina Coronary artery disease Essential hypertension COPD End-stage renal disease Nicotine abuse       Hospital Course Patient is a 67 year old with known history of coronary artery disease presented with chest pain.  Patient was seen by cardiology she continued to complain of chest pain therefore underwent a cardiac cath today.  Patient was noted to have patent lm/lad and lcx stents. Chroniclly occluded rca. Medical management was recommended by cardiology.  Patient is doing much better has no chest pain now.  She is stable for discharge             Consults  cardiology  Significant Tests:  See full reports for all details     Dg Chest 1 View  Result Date: 12/22/2017 CLINICAL DATA:  Left-sided chest pain radiating to the neck and arm for several months. EXAM: CHEST  1 VIEW COMPARISON:  12/07/2017 FINDINGS: Stable cardiomegaly without acute pneumonic consolidation or CHF. Aortic atherosclerosis at the arch without aneurysm. Calcific tendinopathy or bursitis about the left shoulder. Minimal undersurface spurring off the distal clavicle at the [facility] joints. No acute osseous abnormality. IMPRESSION: Aortic atherosclerosis with stable cardiomegaly. No active pulmonary disease. Left rotator cuff calcific tendinopathy or calcific bursitis noted. Electronically Signed   By: Ashley Royalty M.D.   On: 12/22/2017 22:57   Dg Chest Port 1 View  Result Date: 12/07/2017 CLINICAL DATA:  Shortness of breath.  Chest pain. EXAM: PORTABLE CHEST 1 VIEW COMPARISON:  08/27/2016 FINDINGS: Again seen cardiomegaly, unchanged mediastinal contours.  There is central peribronchial thickening suspicious for pulmonary edema. No confluent airspace disease. No large pleural effusion. No pneumothorax. IMPRESSION: Cardiomegaly. Peribronchial cuffing suspicious for pulmonary edema, less likely bronchial inflammation/bronchitis. Electronically Signed   By: Jeb Levering M.D.   On: 12/07/2017 23:48       Today   Subjective:   Kimberly Shaffer denies any chest pain  Objective:   Blood pressure 129/81, pulse 62, temperature 98.4 F (36.9 C), temperature source Oral, resp. rate 16, height 5\' 6"  (1.676 m), weight 103.6 kg (228 lb 4.8 oz), SpO2 98 %.  .  Intake/Output Summary (Last 24 hours) at 12/25/2017 1506 Last data filed at 12/25/2017 1355 Gross per 24 hour  Intake 840 ml  Output -  Net 840 ml    Exam VITAL SIGNS: Blood pressure 129/81, pulse 62, temperature 98.4 F (36.9 C), temperature source Oral, resp. rate 16, height 5\' 6"  (1.676 m), weight 103.6 kg (228 lb 4.8 oz), SpO2 98 %.  GENERAL:  66 y.o.-year-old patient lying in the bed with no acute distress.  EYES: Pupils equal, round, reactive to light and accommodation. No scleral icterus. Extraocular muscles intact.  HEENT: Head atraumatic, normocephalic. Oropharynx and nasopharynx clear.  NECK:  Supple, no jugular venous distention. No thyroid enlargement, no tenderness.  LUNGS: Normal breath sounds bilaterally, no wheezing, rales,rhonchi or crepitation. No use of accessory muscles of respiration.  CARDIOVASCULAR: S1, S2 normal. No murmurs, rubs, or gallops.  ABDOMEN: Soft, nontender, nondistended. Bowel sounds present. No organomegaly or mass.  EXTREMITIES: No pedal edema, cyanosis, or clubbing.  NEUROLOGIC: Cranial nerves II through XII are intact. Muscle strength 5/5 in all extremities. Sensation  intact. Gait not checked.  PSYCHIATRIC: The patient is alert and oriented x 3.  SKIN: No obvious rash, lesion, or ulcer.   Data Review     CBC w Diff:  Lab Results  Component Value  Date   WBC 7.6 12/22/2017   HGB 8.0 (L) 12/22/2017   HGB 9.3 (L) 04/09/2014   HCT 25.1 (L) 12/22/2017   HCT 29.0 (L) 04/09/2014   PLT 193 12/22/2017   PLT 191 04/09/2014   LYMPHOPCT 21 12/22/2017   LYMPHOPCT 18.5 04/09/2014   MONOPCT 10 12/22/2017   MONOPCT 7.9 04/09/2014   EOSPCT 2 12/22/2017   EOSPCT 2.4 04/09/2014   BASOPCT 1 12/22/2017   BASOPCT 0.5 04/09/2014   CMP:  Lab Results  Component Value Date   NA 138 12/22/2017   NA 140 04/09/2014   K 3.7 12/22/2017   K 4.5 04/09/2014   CL 103 12/22/2017   CL 113 (H) 04/09/2014   CO2 25 12/22/2017   CO2 20 (L) 04/09/2014   BUN 49 (H) 12/22/2017   BUN 36 (H) 04/09/2014   CREATININE 7.48 (H) 12/22/2017   CREATININE 3.34 (H) 04/09/2014   PROT 8.1 06/27/2017   PROT 8.9 (H) 07/20/2013   ALBUMIN 3.4 (L) 06/29/2017   ALBUMIN 3.5 07/20/2013   BILITOT 0.6 06/27/2017   BILITOT 0.5 07/20/2013   ALKPHOS 70 06/27/2017   ALKPHOS 127 07/20/2013   AST 26 06/27/2017   AST 39 (H) 07/20/2013   ALT 20 06/27/2017   ALT 22 07/20/2013  .  Micro Results No results found for this or any previous visit (from the past 240 hour(s)).      Code Status Orders  (From admission, onward)        Start     Ordered   12/25/17 0835  Full code  Continuous     12/25/17 0834    Code Status History    Date Active Date Inactive Code Status Order ID Comments User Context   12/23/2017 0927 12/25/2017 0834 Full Code [id number]  Harrie Foreman, MD Inpatient   12/08/2017 0357 12/08/2017 2344 Full Code [id number]  Harrie Foreman, MD Inpatient   06/28/2017 0749 07/01/2017 2218 Full Code [id number]  Harrie Foreman, MD Inpatient   07/19/2016 0857 07/19/2016 1319 Full Code [id number]  Corey Skains, MD Inpatient   07/05/2016 2236 07/09/2016 1950 Full Code [id number]  Harvie Bridge, DO Inpatient   12/17/2015 4970 12/20/2015 1957 Full Code [id number]  Saundra Shelling, MD Inpatient            Discharge Medications   Allergies as of 12/25/2017       Reactions   Nystatin Hives, Itching   Sulfa Antibiotics Swelling, Hives, Rash   Niaspan [niacin] Other (See Comments), Hives      Medication List    TAKE these medications   albuterol 108 (90 Base) MCG/ACT inhaler Commonly known as:  PROVENTIL HFA;VENTOLIN HFA Inhale 2 puffs into the lungs every 6 (six) hours as needed for wheezing or shortness of breath.   amLODipine 10 MG tablet Commonly known as:  NORVASC Take 10 mg by mouth daily.   aspirin EC 81 MG tablet Take 81 mg by mouth daily.   calcium carbonate 1500 (600 Ca) MG Tabs tablet Commonly known as:  OSCAL Take 600 mg of elemental calcium by mouth daily with breakfast.   feeding supplement (NEPRO CARB STEADY) Liqd Take 237 mLs by mouth 2 (two) times daily between meals.   folic acid 1 MG tablet  Commonly known as:  FOLVITE Take 1 mg by mouth daily.   gabapentin 100 MG capsule Commonly known as:  NEURONTIN Take 100 mg by mouth every evening.   HYDROcodone-acetaminophen 5-325 MG tablet Commonly known as:  NORCO/VICODIN Take 1 tablet by mouth every 6 (six) hours as needed for moderate pain.   ipratropium 0.02 % nebulizer solution Commonly known as:  ATROVENT Take 2.5 mLs (0.5 mg total) by nebulization every 6 (six) hours as needed for wheezing or shortness of breath.   levocetirizine 5 MG tablet Commonly known as:  XYZAL Take 5 mg by mouth daily.   lubiprostone 24 MCG capsule Commonly known as:  AMITIZA Take 24 mcg by mouth daily as needed for constipation.   meclizine 12.5 MG tablet Commonly known as:  ANTIVERT Take 1 tablet (12.5 mg total) by mouth 3 (three) times daily as needed for dizziness or nausea. What changed:  how much to take   meloxicam 15 MG tablet Commonly known as:  MOBIC Take 1 tablet (15 mg total) by mouth daily.   methocarbamol 750 MG tablet Commonly known as:  ROBAXIN Take 750 mg by mouth 2 (two) times daily as needed for muscle spasms.   metoprolol succinate 100 MG 24 hr  tablet Commonly known as:  TOPROL-XL Take 100 mg by mouth daily. Take with or immediately following a meal.   nicotine 21 mg/24hr patch Commonly known as:  NICODERM CQ - dosed in mg/24 hours Place 1 patch (21 mg total) onto the skin daily.   nitroGLYCERIN 0.4 MG/SPRAY spray Commonly known as:  NITROLINGUAL Place 2 sprays under the tongue every 5 (five) minutes x 3 doses as needed for chest pain.   pantoprazole 40 MG tablet Commonly known as:  PROTONIX Take 40 mg by mouth 2 (two) times daily.   RENVELA 2.4 g Pack Generic drug:  sevelamer carbonate Take 2.4 g by mouth 2 (two) times daily.   rosuvastatin 20 MG tablet Commonly known as:  CRESTOR Take 20 mg by mouth daily.   SENSIPAR 30 MG tablet Generic drug:  cinacalcet Take 30 mg by mouth daily.   SPIRIVA HANDIHALER 18 MCG inhalation capsule Generic drug:  tiotropium Place 1 capsule into inhaler and inhale daily.   SYMBICORT 160-4.5 MCG/ACT inhaler Generic drug:  budesonide-formoterol Inhale 2 puffs into the lungs 2 (two) times daily.   venlafaxine XR 37.5 MG 24 hr capsule Commonly known as:  EFFEXOR-XR Take 37.5 mg by mouth daily.   VITAMIN E PO Take 1 capsule by mouth daily.          Total Time in preparing paper work, data evaluation and todays exam - 39 minutes  Dustin Flock M.D on 12/25/2017 at 3:06 PM [place]  [phone]

## 2017-12-25 NOTE — Discharge Instructions (Signed)
°Moderate Conscious Sedation, Adult, Care After °These instructions provide you with information about caring for yourself after your procedure. Your health care provider may also give you more specific instructions. Your treatment has been planned according to current medical practices, but problems sometimes occur. Call your health care provider if you have any problems or questions after your procedure. °What can I expect after the procedure? °After your procedure, it is common: °· To feel sleepy for several hours. °· To feel clumsy and have poor balance for several hours. °· To have poor judgment for several hours. °· To vomit if you eat too soon. ° °Follow these instructions at home: °For at least 24 hours after the procedure: ° °· Do not: °? Participate in activities where you could fall or become injured. °? Drive. °? Use heavy machinery. °? Drink alcohol. °? Take sleeping pills or medicines that cause drowsiness. °? Make important decisions or sign legal documents. °? Take care of children on your own. °· Rest. °Eating and drinking °· Follow the diet recommended by your health care provider. °· If you vomit: °? Drink water, juice, or soup when you can drink without vomiting. °? Make sure you have little or no nausea before eating solid foods. °General instructions °· Have a responsible adult stay with you until you are awake and alert. °· Take over-the-counter and prescription medicines only as told by your health care provider. °· If you smoke, do not smoke without supervision. °· Keep all follow-up visits as told by your health care provider. This is important. °Contact a health care provider if: °· You keep feeling nauseous or you keep vomiting. °· You feel light-headed. °· You develop a rash. °· You have a fever. °Get help right away if: °· You have trouble breathing. °This information is not intended to replace advice given to you by your health care provider. Make sure you discuss any questions you  have with your health care provider. °Document Released: 06/26/2013 Document Revised: 02/08/2016 Document Reviewed: 12/26/2015 °Elsevier Interactive Patient Education © 2018 [employer] ° ° ° °Femoral Site Care °Refer to this sheet in the next few weeks. These instructions provide you with information about caring for yourself after your procedure. Your health care provider may also give you more specific instructions. Your treatment has been planned according to current medical practices, but problems sometimes occur. Call your health care provider if you have any problems or questions after your procedure. °What can I expect after the procedure? °After your procedure, it is typical to have the following: °· Bruising at the site that usually fades within 1-2 weeks. °· Blood collecting in the tissue (hematoma) that may be painful to the touch. It should usually decrease in size and tenderness within 1-2 weeks. ° °Follow these instructions at home: °· Take medicines only as directed by your health care provider. °· You may shower 24-48 hours after the procedure or as directed by your health care provider. Remove the bandage (dressing) and gently wash the site with plain soap and water. Pat the area dry with a clean towel. Do not rub the site, because this may cause bleeding. °· Do not take baths, swim, or use a hot tub until your health care provider approves. °· Check your insertion site every day for redness, swelling, or drainage. °· Do not apply powder or lotion to the site. °· Limit use of stairs to twice a day for the first 2-3 days or as directed by your health care   provider. °· Do not squat for the first 2-3 days or as directed by your health care provider. °· Do not lift over 10 lb (4.5 kg) for 5 days after your procedure or as directed by your health care provider. °· Ask your health care provider when it is okay to: °? Return to work or school. °? Resume usual physical activities or sports. °? Resume  sexual activity. °· Do not drive home if you are discharged the same day as the procedure. Have someone else drive you. °· You may drive 24 hours after the procedure unless otherwise instructed by your health care provider. °· Do not operate machinery or power tools for 24 hours after the procedure or as directed by your health care provider. °· If your procedure was done as an outpatient procedure, which means that you went home the same day as your procedure, a responsible adult should be with you for the first 24 hours after you arrive home. °· Keep all follow-up visits as directed by your health care provider. This is important. °Contact a health care provider if: °· You have a fever. °· You have chills. °· You have increased bleeding from the site. Hold pressure on the site. °Get help right away if: °· You have unusual pain at the site. °· You have redness, warmth, or swelling at the site. °· You have drainage (other than a small amount of blood on the dressing) from the site. °· The site is bleeding, and the bleeding does not stop after 30 minutes of holding steady pressure on the site. °· Your leg or foot becomes pale, cool, tingly, or numb. °This information is not intended to replace advice given to you by your health care provider. Make sure you discuss any questions you have with your health care provider. °Document Released: 05/09/2014 Document Revised: 02/11/2016 Document Reviewed: 03/25/2014 °Elsevier Interactive Patient Education © 2018 [employer] Inc[employer] ° °

## 2017-12-25 NOTE — Progress Notes (Signed)
[facility]  ROUNDING NOTE   Subjective:   Patient had cardiac cath this morning For report is not available but patent left main, LAD and left circumflex stents were noted.  Chronically occluded RCA.  Medical management recommended. Patient states she feels better Denies any nausea or vomiting.  No edema.  No shortness of breath  Objective:  Vital signs in last 24 hours:  Temp:  [98.4 F (36.9 C)-98.6 F (37 C)] 98.4 F (36.9 C) (04/08 0710) Pulse Rate:  [62-88] 62 (04/08 1000) Resp:  [15-25] 16 (04/08 1000) BP: (116-149)/(68-102) 129/81 (04/08 1000) SpO2:  [93 %-100 %] 98 % (04/08 1000)  Weight change:  Filed Weights   12/23/17 0929 12/23/17 1926 12/24/17 0432  Weight: 226 lb 3.2 oz (102.6 kg) 227 lb 3.2 oz (103.1 kg) 228 lb 4.8 oz (103.6 kg)    Intake/Output: I/O last 3 completed shifts: In: 720 [P.O.:720] Out: 400 [Urine:400]   Intake/Output this shift:  No intake/output data recorded.  Physical Exam: General: NAD, laying in bed  Head: Normocephalic, atraumatic. Moist oral mucosal membranes  Eyes: Anicteric,   Neck: Supple, trachea midline  Lungs:  Clear to auscultation  Heart: Regular rate and rhythm  Abdomen:  Soft, nontender, obese  Extremities:  no peripheral edema.  Neurologic: Nonfocal, moving all four extremities  Skin: No lesions  Access: Left AVF    Basic Metabolic Panel: Recent Labs  Lab 12/22/17 2258  NA 138  K 3.7  CL 103  CO2 25  GLUCOSE 179*  BUN 49*  CREATININE 7.48*  CALCIUM 8.3*    Liver Function Tests: No results for input(s): AST, ALT, ALKPHOS, BILITOT, PROT, ALBUMIN in the last 168 hours. No results for input(s): LIPASE, AMYLASE in the last 168 hours. No results for input(s): AMMONIA in the last 168 hours.  CBC: Recent Labs  Lab 12/22/17 2258  WBC 7.6  NEUTROABS 5.1  HGB 8.0*  HCT 25.1*  MCV 96.5  PLT 193    Cardiac Enzymes: Recent Labs  Lab 12/22/17 2258 12/23/17 0845 12/23/17 1301  12/23/17 1900  TROPONINI 0.05* 0.07* 0.07* 0.06*    BNP: Invalid input(s): POCBNP  CBG: Recent Labs  Lab 12/25/17 0717  GLUCAP 114*    Microbiology: Results for orders placed or performed during the hospital encounter of 12/07/17  MRSA PCR Screening     Status: None   Collection Time: 12/08/17  4:07 AM  Result Value Ref Range Status   MRSA by PCR NEGATIVE NEGATIVE Final    Comment:        The GeneXpert MRSA Assay (FDA approved for NASAL specimens only), is one component of a comprehensive MRSA colonization surveillance program. It is not intended to diagnose MRSA infection nor to guide or monitor treatment for MRSA infections. Performed at [facility], [address] [place], [place] [address]     Coagulation Studies: No results for input(s): LABPROT, INR in the last 72 hours.  Urinalysis: No results for input(s): COLORURINE, LABSPEC, PHURINE, GLUCOSEU, HGBUR, BILIRUBINUR, KETONESUR, PROTEINUR, UROBILINOGEN, NITRITE, LEUKOCYTESUR in the last 72 hours.  Invalid input(s): APPERANCEUR    Imaging: No results found.   Medications:   . sodium chloride     . amLODipine  10 mg Oral Daily  . aspirin EC  81 mg Oral Daily  . calcium carbonate  600 mg of elemental calcium Oral Q breakfast  . cinacalcet  30 mg Oral Daily  . docusate sodium  100 mg Oral BID  . feeding supplement (  NEPRO CARB STEADY)  237 mL Oral Q24H  . folic acid  1 mg Oral Daily  . gabapentin  100 mg Oral BH-q7a  . heparin  5,000 Units Subcutaneous Q8H  . metoprolol succinate  100 mg Oral Daily  . mometasone-formoterol  2 puff Inhalation BID  . multivitamin  1 tablet Oral QHS  . nicotine  21 mg Transdermal Daily  . pantoprazole  40 mg Oral BID  . rosuvastatin  20 mg Oral Daily  . sodium chloride flush  3 mL Intravenous Q12H  . sodium chloride flush  3 mL Intravenous Q12H  . tiotropium  1 capsule Inhalation Daily  . venlafaxine XR  75 mg Oral Daily   sodium chloride, acetaminophen  **OR** acetaminophen, HYDROcodone-acetaminophen, meclizine, methocarbamol, nitroGLYCERIN, nitroGLYCERIN, ondansetron **OR** ondansetron (ZOFRAN) IV, sodium chloride flush  Assessment/ Plan:  [name] is a 67 y.o. black female with end stage renal disease on hemodialysis, coronary artery disease status post CABG, hypertension, peripheral vascular disease, diabetes mellitus type 2, hyperlipidemia, bilateral total knee replacement, PD catheter removal June 30, 2017 Admitted for chest pain  [facility]/[address]TTS/left AVF/ 103kg  1.  ESRD on hemodialysis:  Continue TTS schedule.  Volume status is acceptable.  Patient can go to her routine dialysis tomorrow  2. Hypertension: with chest pain and elevated cardiac enzymes -Underwent cardiac catheterization this morning.  Medical management recommended.  3.  Anemia of chronic kidney disease: hemoglobin 8.  - EPO with HD treatment  4.  Secondary hyperparathyroidism: with hyperphosphatemia:   - Unable to tolerate Auryxia and sevelamer, will hold.  - Continue cinacalcet - TUMS for binding   LOS: 0 Kimberly Shaffer 4/8/201912:22 PM

## 2017-12-25 NOTE — Progress Notes (Signed)
Discharge instructions explained to pt/ verbalized an understanding/ iv and tele removed/ will transport off unit via wheelchair.  

## 2017-12-25 NOTE — Progress Notes (Signed)
Patient Name: Kimberly Shaffer Date of Encounter: 12/25/2017  Hospital Problem List     Active Problems:   Chest pain    Patient Profile     Pt with pvd and cad and esrd on hd with chest pain. Ruled out for mi. Atypical pain.   Subjective  No further chest pain  Inpatient Medications    . [MAR Hold] amLODipine  10 mg Oral Daily  . [MAR Hold] aspirin EC  81 mg Oral Daily  . [MAR Hold] calcium carbonate  600 mg of elemental calcium Oral Q breakfast  . [MAR Hold] cinacalcet  30 mg Oral Daily  . [MAR Hold] docusate sodium  100 mg Oral BID  . [MAR Hold] feeding supplement (NEPRO CARB STEADY)  237 mL Oral Q24H  . [MAR Hold] folic acid  1 mg Oral Daily  . [MAR Hold] gabapentin  100 mg Oral BH-q7a  . [MAR Hold] heparin  5,000 Units Subcutaneous Q8H  . [MAR Hold] metoprolol succinate  100 mg Oral Daily  . [MAR Hold] mometasone-formoterol  2 puff Inhalation BID  . [MAR Hold] multivitamin  1 tablet Oral QHS  . [MAR Hold] nicotine  21 mg Transdermal Daily  . [MAR Hold] pantoprazole  40 mg Oral BID  . [MAR Hold] rosuvastatin  20 mg Oral Daily  . [MAR Hold] sodium chloride flush  3 mL Intravenous Q12H  . sodium chloride flush  3 mL Intravenous Q12H  . sodium chloride flush  3 mL Intravenous Q12H  . [MAR Hold] tiotropium  1 capsule Inhalation Daily  . [MAR Hold] venlafaxine XR  75 mg Oral Daily    Vital Signs    Vitals:   12/24/17 1608 12/24/17 1943 12/25/17 0608 12/25/17 0710  BP: (!) 140/102 116/72 134/87 138/70  Pulse: 88 69 68 70  Resp: 18   (!) 25  Temp:  98.6 F (37 C) 98.4 F (36.9 C) 98.4 F (36.9 C)  TempSrc:  Oral Oral Oral  SpO2: 99% 99% 99% 93%  Weight:      Height:        Intake/Output Summary (Last 24 hours) at 12/25/2017 0842 Last data filed at 12/24/2017 1839 Gross per 24 hour  Intake 720 ml  Output -  Net 720 ml   Filed Weights   12/23/17 0929 12/23/17 1926 12/24/17 0432  Weight: 102.6 kg (226 lb 3.2 oz) 103.1 kg (227 lb 3.2 oz) 103.6 kg (228 lb 4.8  oz)    Physical Exam    GEN: Well nourished, well developed, in no acute distress.  HEENT: normal.  Neck: Supple, no JVD, carotid bruits, or masses. Cardiac: RRR, no murmurs, rubs, or gallops. No clubbing, cyanosis, edema.  Radials/DP/PT 2+ and equal bilaterally.  Respiratory:  Respirations regular and unlabored, clear to auscultation bilaterally. GI: Soft, nontender, nondistended, BS + x 4. MS: no deformity or atrophy. Skin: warm and dry, no rash. Neuro:  Strength and sensation are intact. Psych: Normal affect.  Labs    CBC Recent Labs    12/22/17 2258  WBC 7.6  NEUTROABS 5.1  HGB 8.0*  HCT 25.1*  MCV 96.5  PLT 474   Basic Metabolic Panel Recent Labs    12/22/17 2258  NA 138  K 3.7  CL 103  CO2 25  GLUCOSE 179*  BUN 49*  CREATININE 7.48*  CALCIUM 8.3*   Liver Function Tests No results for input(s): AST, ALT, ALKPHOS, BILITOT, PROT, ALBUMIN in the last 72 hours. No results for input(s):  LIPASE, AMYLASE in the last 72 hours. Cardiac Enzymes Recent Labs    12/23/17 0845 12/23/17 1301 12/23/17 1900  TROPONINI 0.07* 0.07* 0.06*   BNP No results for input(s): BNP in the last 72 hours. D-Dimer No results for input(s): DDIMER in the last 72 hours. Hemoglobin A1C No results for input(s): HGBA1C in the last 72 hours. Fasting Lipid Panel No results for input(s): CHOL, HDL, LDLCALC, TRIG, CHOLHDL, LDLDIRECT in the last 72 hours. Thyroid Function Tests Recent Labs    12/23/17 0845  TSH 0.323*    Telemetry    nsr  ECG    nsr  Radiology    Dg Chest 1 View  Result Date: 12/22/2017 CLINICAL DATA:  Left-sided chest pain radiating to the neck and arm for several months. EXAM: CHEST  1 VIEW COMPARISON:  12/07/2017 FINDINGS: Stable cardiomegaly without acute pneumonic consolidation or CHF. Aortic atherosclerosis at the arch without aneurysm. Calcific tendinopathy or bursitis about the left shoulder. Minimal undersurface spurring off the distal clavicle at  the [facility] joints. No acute osseous abnormality. IMPRESSION: Aortic atherosclerosis with stable cardiomegaly. No active pulmonary disease. Left rotator cuff calcific tendinopathy or calcific bursitis noted. Electronically Signed   By: Ashley Royalty M.D.   On: 12/22/2017 22:57   Dg Chest Port 1 View  Result Date: 12/07/2017 CLINICAL DATA:  Shortness of breath.  Chest pain. EXAM: PORTABLE CHEST 1 VIEW COMPARISON:  08/27/2016 FINDINGS: Again seen cardiomegaly, unchanged mediastinal contours. There is central peribronchial thickening suspicious for pulmonary edema. No confluent airspace disease. No large pleural effusion. No pneumothorax. IMPRESSION: Cardiomegaly. Peribronchial cuffing suspicious for pulmonary edema, less likely bronchial inflammation/bronchitis. Electronically Signed   By: Jeb Levering M.D.   On: 12/07/2017 23:48    Assessment & Plan    Cad-cath completed this am. No immediate complications. Patent lm/lad and lcx stents. Chroniclly occluded rca. Medical management. Continue current antianginals. Pt will need HD per nephrology.   ESRD-on hd. HD per nephrology  Signed, Javier Docker. Shonnie Poudrier MD 12/25/2017, 8:42 AM  Pager: [phone]

## 2018-01-11 ENCOUNTER — Emergency Department: Payer: Medicare HMO

## 2018-01-11 ENCOUNTER — Inpatient Hospital Stay
Admission: EM | Admit: 2018-01-11 | Discharge: 2018-01-14 | DRG: 280 | Disposition: A | Discharge status: Home / Self Care | Payer: Medicare HMO | Attending: Internal Medicine | Admitting: Internal Medicine

## 2018-01-11 ENCOUNTER — Encounter: Payer: Self-pay | Admitting: Emergency Medicine

## 2018-01-11 DIAGNOSIS — E7800 Pure hypercholesterolemia, unspecified: Secondary | ICD-10-CM | POA: Diagnosis present

## 2018-01-11 DIAGNOSIS — N2581 Secondary hyperparathyroidism of renal origin: Secondary | ICD-10-CM | POA: Diagnosis present

## 2018-01-11 DIAGNOSIS — I21A1 Myocardial infarction type 2: Secondary | ICD-10-CM | POA: Diagnosis present

## 2018-01-11 DIAGNOSIS — I5033 Acute on chronic diastolic (congestive) heart failure: Secondary | ICD-10-CM | POA: Diagnosis present

## 2018-01-11 DIAGNOSIS — E1122 Type 2 diabetes mellitus with diabetic chronic kidney disease: Secondary | ICD-10-CM | POA: Diagnosis present

## 2018-01-11 DIAGNOSIS — J9601 Acute respiratory failure with hypoxia: Secondary | ICD-10-CM | POA: Diagnosis not present

## 2018-01-11 DIAGNOSIS — J9621 Acute and chronic respiratory failure with hypoxia: Secondary | ICD-10-CM | POA: Diagnosis present

## 2018-01-11 DIAGNOSIS — Z8249 Family history of ischemic heart disease and other diseases of the circulatory system: Secondary | ICD-10-CM

## 2018-01-11 DIAGNOSIS — Z955 Presence of coronary angioplasty implant and graft: Secondary | ICD-10-CM

## 2018-01-11 DIAGNOSIS — G4733 Obstructive sleep apnea (adult) (pediatric): Secondary | ICD-10-CM | POA: Diagnosis present

## 2018-01-11 DIAGNOSIS — J9600 Acute respiratory failure, unspecified whether with hypoxia or hypercapnia: Secondary | ICD-10-CM

## 2018-01-11 DIAGNOSIS — J441 Chronic obstructive pulmonary disease with (acute) exacerbation: Secondary | ICD-10-CM | POA: Diagnosis present

## 2018-01-11 DIAGNOSIS — F17210 Nicotine dependence, cigarettes, uncomplicated: Secondary | ICD-10-CM | POA: Diagnosis present

## 2018-01-11 DIAGNOSIS — M545 Low back pain: Secondary | ICD-10-CM | POA: Diagnosis present

## 2018-01-11 DIAGNOSIS — I132 Hypertensive heart and chronic kidney disease with heart failure and with stage 5 chronic kidney disease, or end stage renal disease: Principal | ICD-10-CM | POA: Diagnosis present

## 2018-01-11 DIAGNOSIS — G8929 Other chronic pain: Secondary | ICD-10-CM | POA: Diagnosis present

## 2018-01-11 DIAGNOSIS — Z96653 Presence of artificial knee joint, bilateral: Secondary | ICD-10-CM | POA: Diagnosis present

## 2018-01-11 DIAGNOSIS — E1140 Type 2 diabetes mellitus with diabetic neuropathy, unspecified: Secondary | ICD-10-CM | POA: Diagnosis present

## 2018-01-11 DIAGNOSIS — E876 Hypokalemia: Secondary | ICD-10-CM | POA: Diagnosis present

## 2018-01-11 DIAGNOSIS — J440 Chronic obstructive pulmonary disease with acute lower respiratory infection: Secondary | ICD-10-CM | POA: Diagnosis present

## 2018-01-11 DIAGNOSIS — Z833 Family history of diabetes mellitus: Secondary | ICD-10-CM

## 2018-01-11 DIAGNOSIS — J810 Acute pulmonary edema: Secondary | ICD-10-CM

## 2018-01-11 DIAGNOSIS — I2510 Atherosclerotic heart disease of native coronary artery without angina pectoris: Secondary | ICD-10-CM | POA: Diagnosis present

## 2018-01-11 DIAGNOSIS — I214 Non-ST elevation (NSTEMI) myocardial infarction: Secondary | ICD-10-CM | POA: Diagnosis not present

## 2018-01-11 DIAGNOSIS — I252 Old myocardial infarction: Secondary | ICD-10-CM | POA: Diagnosis not present

## 2018-01-11 DIAGNOSIS — W19XXXA Unspecified fall, initial encounter: Secondary | ICD-10-CM

## 2018-01-11 DIAGNOSIS — E669 Obesity, unspecified: Secondary | ICD-10-CM | POA: Diagnosis present

## 2018-01-11 DIAGNOSIS — Z992 Dependence on renal dialysis: Secondary | ICD-10-CM

## 2018-01-11 DIAGNOSIS — Z791 Long term (current) use of non-steroidal anti-inflammatories (NSAID): Secondary | ICD-10-CM

## 2018-01-11 DIAGNOSIS — Z6835 Body mass index (BMI) 35.0-35.9, adult: Secondary | ICD-10-CM

## 2018-01-11 DIAGNOSIS — F329 Major depressive disorder, single episode, unspecified: Secondary | ICD-10-CM | POA: Diagnosis present

## 2018-01-11 DIAGNOSIS — Z951 Presence of aortocoronary bypass graft: Secondary | ICD-10-CM | POA: Diagnosis not present

## 2018-01-11 DIAGNOSIS — E875 Hyperkalemia: Secondary | ICD-10-CM | POA: Diagnosis present

## 2018-01-11 DIAGNOSIS — Z9115 Patient's noncompliance with renal dialysis: Secondary | ICD-10-CM

## 2018-01-11 DIAGNOSIS — N186 End stage renal disease: Secondary | ICD-10-CM | POA: Diagnosis not present

## 2018-01-11 DIAGNOSIS — J9690 Respiratory failure, unspecified, unspecified whether with hypoxia or hypercapnia: Secondary | ICD-10-CM | POA: Diagnosis present

## 2018-01-11 DIAGNOSIS — E782 Mixed hyperlipidemia: Secondary | ICD-10-CM | POA: Diagnosis present

## 2018-01-11 DIAGNOSIS — J181 Lobar pneumonia, unspecified organism: Secondary | ICD-10-CM | POA: Diagnosis present

## 2018-01-11 DIAGNOSIS — Z7982 Long term (current) use of aspirin: Secondary | ICD-10-CM

## 2018-01-11 DIAGNOSIS — Z7902 Long term (current) use of antithrombotics/antiplatelets: Secondary | ICD-10-CM

## 2018-01-11 DIAGNOSIS — D631 Anemia in chronic kidney disease: Secondary | ICD-10-CM | POA: Diagnosis present

## 2018-01-11 DIAGNOSIS — Z888 Allergy status to other drugs, medicaments and biological substances status: Secondary | ICD-10-CM

## 2018-01-11 DIAGNOSIS — R0602 Shortness of breath: Secondary | ICD-10-CM | POA: Diagnosis not present

## 2018-01-11 DIAGNOSIS — Z809 Family history of malignant neoplasm, unspecified: Secondary | ICD-10-CM

## 2018-01-11 DIAGNOSIS — Z8673 Personal history of transient ischemic attack (TIA), and cerebral infarction without residual deficits: Secondary | ICD-10-CM

## 2018-01-11 DIAGNOSIS — E872 Acidosis: Secondary | ICD-10-CM | POA: Diagnosis present

## 2018-01-11 DIAGNOSIS — Z882 Allergy status to sulfonamides status: Secondary | ICD-10-CM

## 2018-01-11 DIAGNOSIS — M1990 Unspecified osteoarthritis, unspecified site: Secondary | ICD-10-CM | POA: Diagnosis present

## 2018-01-11 LAB — CBC WITH DIFFERENTIAL/PLATELET
BASOS ABS: 0.1 10*3/uL (ref 0–0.1)
Basophils Relative: 1 %
Eosinophils Absolute: 0 10*3/uL (ref 0–0.7)
Eosinophils Relative: 0 %
HEMATOCRIT: 26 % — AB (ref 35.0–47.0)
Hemoglobin: 8.4 g/dL — ABNORMAL LOW (ref 12.0–16.0)
LYMPHS ABS: 2 10*3/uL (ref 1.0–3.6)
LYMPHS PCT: 16 %
MCH: 31.2 pg (ref 26.0–34.0)
MCHC: 32.2 g/dL (ref 32.0–36.0)
MCV: 96.8 fL (ref 80.0–100.0)
MONO ABS: 1 10*3/uL — AB (ref 0.2–0.9)
MONOS PCT: 8 %
NEUTROS ABS: 9.4 10*3/uL — AB (ref 1.4–6.5)
Neutrophils Relative %: 75 %
Platelets: 240 10*3/uL (ref 150–440)
RBC: 2.68 MIL/uL — ABNORMAL LOW (ref 3.80–5.20)
RDW: 16.1 % — AB (ref 11.5–14.5)
WBC: 12.5 10*3/uL — ABNORMAL HIGH (ref 3.6–11.0)

## 2018-01-11 LAB — COMPREHENSIVE METABOLIC PANEL
ALBUMIN: 3.9 g/dL (ref 3.5–5.0)
ALT: 11 U/L — ABNORMAL LOW (ref 14–54)
ANION GAP: 15 (ref 5–15)
AST: 25 U/L (ref 15–41)
Alkaline Phosphatase: 73 U/L (ref 38–126)
BUN: 98 mg/dL — ABNORMAL HIGH (ref 6–20)
CHLORIDE: 106 mmol/L (ref 101–111)
CO2: 14 mmol/L — AB (ref 22–32)
Calcium: 8.3 mg/dL — ABNORMAL LOW (ref 8.9–10.3)
Creatinine, Ser: 11.54 mg/dL — ABNORMAL HIGH (ref 0.44–1.00)
GFR calc Af Amer: 3 mL/min — ABNORMAL LOW (ref >60)
GFR calc non Af Amer: 3 mL/min — ABNORMAL LOW (ref >60)
GLUCOSE: 137 mg/dL — AB (ref 65–99)
POTASSIUM: 5.9 mmol/L — AB (ref 3.5–5.1)
Sodium: 135 mmol/L (ref 135–145)
TOTAL PROTEIN: 9 g/dL — AB (ref 6.5–8.1)
Total Bilirubin: 0.6 mg/dL (ref 0.3–1.2)

## 2018-01-11 LAB — MRSA PCR SCREENING: MRSA by PCR: NEGATIVE

## 2018-01-11 LAB — TROPONIN I
TROPONIN I: 0.16 ng/mL — AB (ref <0.03)
Troponin I: 0.7 ng/mL (ref <0.03)

## 2018-01-11 LAB — BRAIN NATRIURETIC PEPTIDE: B NATRIURETIC PEPTIDE 5: 3690 pg/mL — AB (ref 0.0–100.0)

## 2018-01-11 MED ORDER — IPRATROPIUM-ALBUTEROL 0.5-2.5 (3) MG/3ML IN SOLN: 3.0000 mL | Freq: Four times a day (QID) | RESPIRATORY_TRACT | Status: DC | PRN

## 2018-01-11 MED ORDER — ASPIRIN EC 81 MG PO TBEC
81.0000 mg | DELAYED_RELEASE_TABLET | Freq: Every day | ORAL | Status: DC
  Administered 2018-01-12 – 2018-01-14 (×3): 81 mg via ORAL
  Filled 2018-01-11 (×3): qty 1

## 2018-01-11 MED ORDER — ONDANSETRON HCL 4 MG PO TABS: 4.0000 mg | ORAL_TABLET | Freq: Four times a day (QID) | ORAL | Status: DC | PRN

## 2018-01-11 MED ORDER — HEPARIN SODIUM (PORCINE) 5000 UNIT/ML IJ SOLN
5000.0000 [IU] | Freq: Three times a day (TID) | INTRAMUSCULAR | Status: DC
  Administered 2018-01-12: 5000 [IU] via SUBCUTANEOUS
  Filled 2018-01-11: qty 1

## 2018-01-11 MED ORDER — SENNOSIDES-DOCUSATE SODIUM 8.6-50 MG PO TABS: 1.0000 | ORAL_TABLET | Freq: Every evening | ORAL | Status: DC | PRN

## 2018-01-11 MED ORDER — ALBUTEROL SULFATE (2.5 MG/3ML) 0.083% IN NEBU
2.5000 mg | INHALATION_SOLUTION | Freq: Once | RESPIRATORY_TRACT | Status: AC
  Administered 2018-01-11: 2.5 mg via RESPIRATORY_TRACT
  Filled 2018-01-11: qty 3

## 2018-01-11 MED ORDER — SODIUM CHLORIDE 0.9% FLUSH
3.0000 mL | Freq: Two times a day (BID) | INTRAVENOUS | Status: DC
  Administered 2018-01-11 – 2018-01-14 (×6): 3 mL via INTRAVENOUS

## 2018-01-11 MED ORDER — CLOPIDOGREL BISULFATE 75 MG PO TABS
75.0000 mg | ORAL_TABLET | Freq: Every day | ORAL | Status: DC
  Administered 2018-01-12 – 2018-01-14 (×3): 75 mg via ORAL
  Filled 2018-01-11 (×3): qty 1

## 2018-01-11 MED ORDER — FUROSEMIDE 10 MG/ML IJ SOLN
80.0000 mg | Freq: Once | INTRAMUSCULAR | Status: AC
  Administered 2018-01-11: 80 mg via INTRAVENOUS
  Filled 2018-01-11: qty 8

## 2018-01-11 MED ORDER — ACETAMINOPHEN 650 MG RE SUPP: 650.0000 mg | Freq: Four times a day (QID) | RECTAL | Status: DC | PRN

## 2018-01-11 MED ORDER — SODIUM CHLORIDE 0.9% FLUSH: 3.0000 mL | INTRAVENOUS | Status: DC | PRN

## 2018-01-11 MED ORDER — NITROGLYCERIN 0.4 MG SL SUBL: 0.4000 mg | SUBLINGUAL_TABLET | SUBLINGUAL | Status: DC | PRN

## 2018-01-11 MED ORDER — CALCIUM CARBONATE ANTACID 500 MG PO CHEW
600.0000 mg | CHEWABLE_TABLET | Freq: Every day | ORAL | Status: DC
  Administered 2018-01-12 – 2018-01-14 (×3): 600 mg via ORAL
  Filled 2018-01-11 (×3): qty 3

## 2018-01-11 MED ORDER — ACETAMINOPHEN 325 MG PO TABS
650.0000 mg | ORAL_TABLET | Freq: Four times a day (QID) | ORAL | Status: DC | PRN
  Administered 2018-01-12: 650 mg via ORAL
  Filled 2018-01-11: qty 2

## 2018-01-11 MED ORDER — SODIUM CHLORIDE 0.9 % IV SOLN: 250.0000 mL | INTRAVENOUS | Status: DC | PRN

## 2018-01-11 MED ORDER — CINACALCET HCL 30 MG PO TABS
30.0000 mg | ORAL_TABLET | Freq: Every day | ORAL | Status: DC
  Administered 2018-01-12 – 2018-01-14 (×3): 30 mg via ORAL
  Filled 2018-01-11 (×4): qty 1

## 2018-01-11 MED ORDER — SEVELAMER CARBONATE 2.4 G PO PACK
2.4000 g | PACK | Freq: Two times a day (BID) | ORAL | Status: DC
  Administered 2018-01-12 – 2018-01-14 (×4): 2.4 g via ORAL
  Filled 2018-01-11 (×7): qty 1

## 2018-01-11 MED ORDER — VENLAFAXINE HCL ER 37.5 MG PO CP24
37.5000 mg | ORAL_CAPSULE | Freq: Every day | ORAL | Status: DC
  Administered 2018-01-12 – 2018-01-14 (×3): 37.5 mg via ORAL
  Filled 2018-01-11 (×3): qty 1

## 2018-01-11 MED ORDER — SODIUM POLYSTYRENE SULFONATE 15 GM/60ML PO SUSP: 30.0000 g | Freq: Once | ORAL | Status: DC

## 2018-01-11 MED ORDER — ROSUVASTATIN CALCIUM 10 MG PO TABS
20.0000 mg | ORAL_TABLET | Freq: Every day | ORAL | Status: DC
  Administered 2018-01-12 – 2018-01-14 (×3): 20 mg via ORAL
  Filled 2018-01-11 (×3): qty 2

## 2018-01-11 MED ORDER — GABAPENTIN 100 MG PO CAPS
100.0000 mg | ORAL_CAPSULE | Freq: Every day | ORAL | Status: DC
  Filled 2018-01-11 (×2): qty 1

## 2018-01-11 MED ORDER — NICOTINE 21 MG/24HR TD PT24
21.0000 mg | MEDICATED_PATCH | Freq: Every day | TRANSDERMAL | Status: DC
  Administered 2018-01-12: 21 mg via TRANSDERMAL
  Filled 2018-01-11: qty 1

## 2018-01-11 MED ORDER — NEPRO/CARBSTEADY PO LIQD
237.0000 mL | Freq: Two times a day (BID) | ORAL | Status: DC
  Administered 2018-01-12 – 2018-01-14 (×4): 237 mL via ORAL

## 2018-01-11 MED ORDER — ONDANSETRON HCL 4 MG/2ML IJ SOLN: 4.0000 mg | Freq: Four times a day (QID) | INTRAMUSCULAR | Status: DC | PRN

## 2018-01-11 NOTE — Progress Notes (Signed)
Chaplain engaged family in the ICU waiting room and offered to visit the patient. Chaplain went to the patient's room and spoke with nurse Levada Dy about the situation. After checking in with the patient, Chaplain escorted the family to the patient's room. Silent prayer and presence were offered to the family.

## 2018-01-11 NOTE — ED Notes (Signed)
Called the floor to give report and floor stated that they needed 15 more minutes.

## 2018-01-11 NOTE — ED Notes (Signed)
Called to give report a second time to floor, RN was in isolation room and wants to call this RN back.

## 2018-01-11 NOTE — Progress Notes (Signed)
Pre HD assessment    01/11/18 2150  Vital Signs  Temp 98.5 F (36.9 C)  Temp Source Axillary  Pulse Rate 82  Pulse Rate Source Monitor  Resp (!) 23  BP (!) 143/69  BP Location Right Arm  BP Method Automatic  Patient Position (if appropriate) Lying  Oxygen Therapy  SpO2 100 %  O2 Device Bi-PAP  Pulse Oximetry Type Continuous  Pain Assessment  Pain Scale 0-10  Pain Score 0  Dialysis Weight  Weight 102.6 kg (226 lb 3.1 oz)  Type of Weight Pre-Dialysis  Time-Out for Hemodialysis  What Procedure? HD  Pt Identifiers(min of two) First/Last Name;MRN/Account#  Correct Site? Yes  Correct Side? Yes  Correct Procedure? Yes  Consents Verified? Yes  Rad Studies Available? N/A  Safety Precautions Reviewed? Yes  Engineer, civil (consulting) Number  (6A)  Station Number  (bedside ICU 14)  UF/Alarm Test Passed  Conductivity: Meter 13.8  Conductivity: Machine  13.7  pH 7.6  Reverse Osmosis  (4910, [id number])  Normal Saline Lot Number [id number]  Dialyzer Lot Number [id number]  Disposable Set Lot Number [id number]  Machine Temperature 98.6 F (37 C)  Musician and Audible Yes  Blood Lines Intact and Secured Yes  Pre Treatment Patient Checks  Vascular access used during treatment Fistula  Hepatitis B Surface Antigen Results Negative  Date Hepatitis B Surface Antigen Drawn 06/28/17  Hepatitis B Surface Antibody 362  Date Hepatitis B Surface Antibody Drawn 09/07/17  Hemodialysis Consent Verified Yes  Hemodialysis Standing Orders Initiated Yes  ECG (Telemetry) Monitor On Yes  Prime Ordered Normal Saline  Length of  DialysisTreatment -hour(s) 3.5 Hour(s)  Dialyzer Elisio 17H NR  Dialysate 1K  Dialysis Anticoagulant None  Dialysate Flow Ordered 600  Blood Flow Rate Ordered 400 mL/min  Ultrafiltration Goal 2 Liters  Pre Treatment Labs Hepatitis B Surface Antigen (HBSAB, HBcore)  Dialysis Blood Pressure Support Ordered Normal Saline  Education / Care Plan  Dialysis Education  Provided Yes  Documented Education in Care Plan Yes  Fistula / Graft Right Upper arm Arteriovenous vein graft  No Placement Date or Time found.   Placed prior to admission: Yes  Orientation: Right  Access Location: Upper arm  Access Type: Arteriovenous vein graft  Site Condition No complications  Fistula / Graft Assessment Present;Thrill;Bruit  Drainage Description None

## 2018-01-11 NOTE — Progress Notes (Signed)
HD tx start, tx delayed r/t machine issues. Biomed no   01/11/18 2239  Vital Signs  Pulse Rate 80  Pulse Rate Source Monitor  Resp (!) 25  BP (!) 141/76  BP Location Right Arm  BP Method Automatic  Patient Position (if appropriate) Lying  Oxygen Therapy  SpO2 100 %  O2 Device Bi-PAP  Pulse Oximetry Type Continuous  During Hemodialysis Assessment  Blood Flow Rate (mL/min) 400 mL/min  Arterial Pressure (mmHg) -140 mmHg  Venous Pressure (mmHg) 190 mmHg  Transmembrane Pressure (mmHg) 80 mmHg  Ultrafiltration Rate (mL/min) 710 mL/min  Dialysate Flow Rate (mL/min) 600 ml/min  Conductivity: Machine  13.8  HD Safety Checks Performed Yes  Dialysis Fluid Bolus Normal Saline  Bolus Amount (mL) 250 mL  Intra-Hemodialysis Comments Tx initiated  Fistula / Graft Right Upper arm Arteriovenous vein graft  No Placement Date or Time found.   Placed prior to admission: Yes  Orientation: Right  Access Location: Upper arm  Access Type: Arteriovenous vein graft  Status Accessed  Needle Size 15  tified, issue resolved, MD aware.

## 2018-01-11 NOTE — ED Provider Notes (Signed)
[facility] Emergency Department Provider Note ____________________________________________   First MD Initiated Contact with Patient 01/11/18 1801     (approximate)  I have reviewed the triage vital signs and the nursing notes.   HISTORY  Chief Complaint Shortness of Breath  Level 5 caveat: History of present illness limited due to respiratory distress on BiPAP  HPI Kimberly Shaffer is a 67 y.o. female with history of ESRD on dialysis and other PMH as noted below who presents with shortness of breath, acute onset today, not relieved by albuterol that she took at home, and associated with having missed her last 2 dialysis sessions.   Past Medical History:  Diagnosis Date  . Asthma   . CAD (coronary artery disease)   . Chronic lower back pain   . COPD (chronic obstructive pulmonary disease) ([place])   . Depression   . Diabetes mellitus without complication (HCC)    NIDD  . H/O angina pectoris   . H/O blood clots   . Hypercholesteremia   . Hypertension   . Left rotator cuff tear   . Myocardial infarction ([place])   . Obesity   . Renal insufficiency   . Stroke ([place])   . Vertigo, aural     Patient Active Problem List   Diagnosis Date Noted  . Respiratory failure ([place]) 01/11/2018  . Abdominal pain 06/28/2017  . Bilateral carotid artery stenosis 03/08/2017  . ESRD needing dialysis ([place]) 03/08/2017  . Non-ST elevation myocardial infarction (NSTEMI), subendocardial infarction, subsequent episode of care ([place]) 11/04/2016  . Onychogryphosis 10/31/2016  . Onychomycosis 10/31/2016  . Subungual exostosis 10/31/2016  . Type 2 diabetes mellitus with diabetic neuropathy ([place]) 10/31/2016  . Chronic osteomyelitis of left hand ([place]) 09/03/2016  . Left arm swelling 08/30/2016  . Pre-operative clearance 08/30/2016  . Coronary artery disease of native artery of native heart with stable angina pectoris ([place]) 08/03/2016  . Benign paroxysmal positional vertigo due to  bilateral vestibular disorder 07/26/2016  . H/O: CVA (cerebrovascular accident) 07/26/2016  . Stable angina ([place]) 07/05/2016  . Sepsis due to pneumonia ([place]) 07/05/2016  . Fracture of left foot 04/26/2016  . Pre-transplant evaluation for kidney transplant 02/04/2016  . Chest pain at rest 12/17/2015  . Chest pain 12/17/2015  . DJD of shoulder 10/21/2015  . Perianal lesion 05/20/2015  . 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 Lumbar radiculopathy 05/14/2015  . 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 Benign essential hypertension 04/22/2015  . Grief 03/18/2015  . Lumbosacral facet joint syndrome 02/16/2015  . Greater trochanteric bursitis 02/16/2015  . DDD (degenerative disc disease), lumbosacral 01/20/2015  . 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 Osteoarthritis 01/20/2015  . IDA (iron deficiency anemia) 03/25/2014  . Thyroid nodule 01/15/2014  . Lung nodule 08/28/2013  . Tobacco abuse 05/17/2013  . Mixed hyperlipidemia 06/14/2011  . OSA on CPAP 06/14/2011  . PAD (peripheral artery disease) ([place]) 06/14/2011  . Depression 05/05/2011  . Chronic kidney disease (CKD), stage V ([place]) 03/26/2011  . Multiple vessel coronary artery disease 03/26/2011  . Obesity, unspecified 03/26/2011  . Type 2 diabetes mellitus ([place]) 03/26/2011    Past Surgical History:  Procedure Laterality Date  . A/V FISTULAGRAM Left 03/14/2017   Procedure: A/V Fistulagram;  Surgeon: Katha Cabal, MD;  Location: [place] [facility];  Service: Cardiovascular;  Laterality: Left;  . AV FISTULA PLACEMENT Left 11-04-2015  . AV FISTULA PLACEMENT Left 11/04/2015   Procedure: ARTERIOVENOUS (AV) FISTULA CREATION  (  BRACHIAL CEPHALIC );  Surgeon: Katha Cabal, MD;  Location: [facility];  Service: Vascular;  Laterality: Left;  . CARDIAC CATHETERIZATION    . CARDIAC CATHETERIZATION Left 07/19/2016   Procedure: Left Heart Cath and Coronary Angiography;  Surgeon: Corey Skains, MD;  Location: [place] [facility];  Service: Cardiovascular;  Laterality: Left;  . CORONARY ARTERY BYPASS GRAFT  2012  . CORONARY STENT PLACEMENT    . JOINT REPLACEMENT    . KNEE SURGERY Bilateral   . LEFT HEART CATH AND CORONARY ANGIOGRAPHY N/A 12/25/2017   Procedure: LEFT HEART CATH AND CORONARY ANGIOGRAPHY;  Surgeon: Teodoro Spray, MD;  Location: [place] [facility];  Service: Cardiovascular;  Laterality: N/A;  . PERIPHERAL VASCULAR CATHETERIZATION N/A 11/13/2015   Procedure: Dialysis/Perma Catheter Insertion;  Surgeon: Katha Cabal, MD;  Location: [place] [facility];  Service: Cardiovascular;  Laterality: N/A;  . PERIPHERAL VASCULAR CATHETERIZATION N/A 12/04/2015   Procedure: Dialysis/Perma Catheter Insertion;  Surgeon: Katha Cabal, MD;  Location: [place] [facility];  Service: Cardiovascular;  Laterality: N/A;  . PERIPHERAL VASCULAR CATHETERIZATION Left 12/31/2015   Procedure: A/V Shuntogram/Fistulagram;  Surgeon: Algernon Huxley, MD;  Location: [place] [facility];  Service: Cardiovascular;  Laterality: Left;  . PERIPHERAL VASCULAR CATHETERIZATION N/A 12/31/2015   Procedure: A/V Shunt Intervention;  Surgeon: Algernon Huxley, MD;  Location: [place] [facility];  Service: Cardiovascular;  Laterality: N/A;  . PERIPHERAL VASCULAR CATHETERIZATION Left 02/25/2016   Procedure: A/V Shuntogram/Fistulagram;  Surgeon: Algernon Huxley, MD;  Location: [place] [facility];  Service: Cardiovascular;  Laterality: Left;  . PERIPHERAL VASCULAR CATHETERIZATION N/A 03/18/2016   Procedure: Dialysis/Perma Catheter Removal;  Surgeon: Katha Cabal, MD;  Location: [place] [facility];  Service: Cardiovascular;  Laterality: N/A;  . REMOVAL OF A DIALYSIS CATHETER N/A 06/30/2017   Procedure: REMOVAL OF A DIALYSIS CATHETER;  Surgeon: Katha Cabal, MD;  Location: [facility];  Service: Vascular;  Laterality: N/A;  . REPLACEMENT TOTAL KNEE BILATERAL Bilateral     Prior to Admission medications     Medication Sig Start Date End Date Taking? Authorizing Provider  albuterol (PROVENTIL HFA;VENTOLIN HFA) 108 (90 BASE) MCG/ACT inhaler Inhale 2 puffs into the lungs every 6 (six) hours as needed for wheezing or shortness of breath.    Yes [provider]  aspirin EC 81 MG tablet Take 81 mg by mouth daily.   Yes [provider]  calcium carbonate (OSCAL) 1500 (600 Ca) MG TABS tablet Take 600 mg of elemental calcium by mouth daily with breakfast.   Yes [provider]  clopidogrel (PLAVIX) 75 MG tablet Take 1 tablet by mouth daily. 01/03/18  Yes [provider]  gabapentin (NEURONTIN) 100 MG capsule Take 100 mg by mouth at bedtime.   Yes [provider]  meloxicam (MOBIC) 15 MG tablet Take 1 tablet (15 mg total) by mouth daily. 11/16/17 11/16/18 Yes Fisher, Linden Dolin, PA-C  Oxycodone HCl 10 MG TABS Take 10 mg by mouth every 6 (six) hours as needed.  12/28/17  Yes [provider]  pantoprazole (PROTONIX) 40 MG tablet Take 40 mg by mouth 2 (two) times daily.   Yes [provider]  SPIRIVA HANDIHALER 18 MCG inhalation capsule Place 1 capsule into inhaler and inhale daily.   Yes [provider]  SYMBICORT 160-4.5 MCG/ACT inhaler Inhale 2 puffs into the lungs 2 (two) times daily.   Yes [provider]  venlafaxine XR (EFFEXOR-XR) 37.5 MG  24 hr capsule Take 37.5 mg by mouth daily.    Yes [provider]  HYDROcodone-acetaminophen (NORCO/VICODIN) 5-325 MG tablet Take 1 tablet by mouth every 6 (six) hours as needed for moderate pain. Patient not taking: Reported on 12/25/2017 07/01/17 07/01/18  Loletha Grayer, MD  ipratropium (ATROVENT) 0.02 % nebulizer solution Take 2.5 mLs (0.5 mg total) by nebulization every 6 (six) hours as needed for wheezing or shortness of breath. 12/24/17   Dustin Flock, MD  meclizine (ANTIVERT) 12.5 MG tablet Take 1 tablet (12.5 mg total) by mouth 3 (three) times daily as needed for dizziness or  nausea. Patient taking differently: Take 25 mg by mouth 3 (three) times daily as needed for dizziness or nausea.  08/27/16   Daymon Larsen, MD  methocarbamol (ROBAXIN) 750 MG tablet Take 750 mg by mouth 2 (two) times daily as needed for muscle spasms.     [provider]  nicotine (NICODERM CQ - DOSED IN MG/24 HOURS) 21 mg/24hr patch Place 1 patch (21 mg total) onto the skin daily. Patient not taking: Reported on 01/11/2018 12/24/17   Dustin Flock, MD  nitroGLYCERIN (NITROLINGUAL) 0.4 MG/SPRAY spray Place 2 sprays under the tongue every 5 (five) minutes x 3 doses as needed for chest pain.     [provider]  Nutritional Supplements (FEEDING SUPPLEMENT, NEPRO CARB STEADY,) LIQD Take 237 mLs by mouth 2 (two) times daily between meals. 12/08/17   Fritzi Mandes, MD    Allergies Nystatin; Sulfa antibiotics; and Niaspan [niacin]  Family History  Problem Relation Age of Onset  . Cancer Mother   . Cancer Father   . Diabetes Brother   . Heart disease Brother     Social History Social History   Tobacco Use  . Smoking status: Current Every Day Smoker    Packs/day: 0.25    Types: Cigarettes  . Smokeless tobacco: Never Used  Substance Use Topics  . Alcohol use: No    Alcohol/week: 0.0 oz  . Drug use: No    Review of Systems Level 5 caveat: Unable to obtain review of systems due to respiratory distress    ____________________________________________   PHYSICAL EXAM:  VITAL SIGNS: ED Triage Vitals  Enc Vitals Group     BP 01/11/18 1801 (!) 150/84     Pulse Rate 01/11/18 1801 100     Resp 01/11/18 1801 (!) 27     Temp 01/11/18 1801 97.7 F (36.5 C)     Temp Source 01/11/18 1801 Axillary     SpO2 01/11/18 1801 92 %     Weight 01/11/18 1759 228 lb (103.4 kg)     Height 01/11/18 1759 5\' 6"  (1.676 m)     Head Circumference --      Peak Flow --      Pain Score --      Pain Loc --      Pain Edu? --      Excl. in [place]? --     Constitutional: Alert and  oriented.  Uncomfortable appearing in mild respiratory distress. Eyes: Conjunctivae are normal.  Head: Atraumatic. Nose: No congestion/rhinnorhea. Mouth/Throat: Mucous membranes are moist.   Neck: Normal range of motion.  Cardiovascular: Tachycardic, regular rhythm. Grossly normal heart sounds.  Good peripheral circulation. Respiratory: Normal respiratory effort.  No retractions.  Bilateral rales and coarse breath sounds. Gastrointestinal: Soft and nontender. No distention.  Genitourinary: No flank tenderness. Musculoskeletal: No lower extremity edema.  Extremities warm and well perfused.  Mild tenderness around right  hip, with no deformity.  2+ DP and distal radial pulses bilaterally. Neurologic: Motor intact in all extremities.  Normal speech. Skin:  Skin is warm and dry. No rash noted. Psychiatric: Mood and affect are normal.   ____________________________________________   LABS (all labs ordered are listed, but only abnormal results are displayed)  Labs Reviewed  BRAIN NATRIURETIC PEPTIDE - Abnormal; Notable for the following components:      Result Value   B Natriuretic Peptide 3,690.0 (*)    All other components within normal limits  TROPONIN I - Abnormal; Notable for the following components:   Troponin I 0.16 (*)    All other components within normal limits  COMPREHENSIVE METABOLIC PANEL - Abnormal; Notable for the following components:   Potassium 5.9 (*)    CO2 14 (*)    Glucose, Bld 137 (*)    BUN 98 (*)    Creatinine, Ser 11.54 (*)    Calcium 8.3 (*)    Total Protein 9.0 (*)    ALT 11 (*)    GFR calc non Af Amer 3 (*)    GFR calc Af Amer 3 (*)    All other components within normal limits  CBC WITH DIFFERENTIAL/PLATELET - Abnormal; Notable for the following components:   WBC 12.5 (*)    RBC 2.68 (*)    Hemoglobin 8.4 (*)    HCT 26.0 (*)    RDW 16.1 (*)    Neutro Abs 9.4 (*)    Monocytes Absolute 1.0 (*)    All other components within normal limits    ____________________________________________  EKG  ED ECG REPORT I, Arta Silence, the attending physician, personally viewed and interpreted this ECG.  Date: 01/11/2018 EKG Time: 1803 Rate: 98 Rhythm: normal sinus rhythm QRS Axis: normal Intervals: normal ST/T Wave abnormalities: LVH with repolarization abnormality Narrative Interpretation: no evidence of acute ischemia; no significant change when compared to EKG of 12/26/2017  ____________________________________________  RADIOLOGY  CXR: Patchy opacities consistent with pulmonary edema  ____________________________________________   PROCEDURES  Procedure(s) performed: No  Procedures  Critical Care performed: Yes  CRITICAL CARE Performed by: Arta Silence   Total critical care time: 35 minutes  Critical care time was exclusive of separately billable procedures and treating other patients.  Critical care was necessary to treat or prevent imminent or life-threatening deterioration.  Critical care was time spent personally by me on the following activities: development of treatment plan with patient and/or surrogate as well as nursing, discussions with consultants, evaluation of patient's response to treatment, examination of patient, obtaining history from patient or surrogate, ordering and performing treatments and interventions, ordering and review of laboratory studies, ordering and review of radiographic studies, pulse oximetry and re-evaluation of patient's condition. ____________________________________________   INITIAL IMPRESSION / ASSESSMENT AND PLAN / ED COURSE  Pertinent labs & imaging results that were available during my care of the patient were reviewed by me and considered in my medical decision making (see chart for details).  67 year old female with PMH as noted above presents with worsening shortness of breath over the last several days, having missed her last 2 dialysis sessions.  I  reviewed the past medical records in epic; patient was admitted earlier this month with chest pain.  On exam, patient is in mild respiratory distress.  She was placed on CPAP by EMS and switched to BiPAP in the ED.  On BiPAP her O2 sat was in the high 90s.  The remainder of the exam is as described above.  Presentation is consistent with acute CHF/fluid overload related to dialysis.  Chest x-ray confirms this.  I consulted Dr. Juleen China from nephrology who agrees to arrange for urgent dialysis.  I also ordered IV Lasix as the patient still does make some urine, and we will keep the patient on BiPAP for now.   Of note, the family member state that the patient told her that she fell.  She reports some pain around the right hip and right lower back.  I examined the areas and found some tenderness but no deformity or obvious evidence of injury.  Plan will be to reassess and obtain imaging as needed once the patient is dialyzed and her respiratory status is stabilized.  ----------------------------------------- 7:55 PM on 01/11/2018 -----------------------------------------  Patient is admitted.  I signed the patient out to the hospitalist Dr. Estanislado Pandy.      ____________________________________________   FINAL CLINICAL IMPRESSION(S) / ED DIAGNOSES  Final diagnoses:  Acute pulmonary edema (HCC)  End stage renal disease ([place])      NEW MEDICATIONS STARTED DURING THIS VISIT:  New Prescriptions   No medications on file     Note:  This document was prepared using Dragon voice recognition software and may include unintentional dictation errors.    Arta Silence, MD 01/11/18 [phone]

## 2018-01-11 NOTE — Progress Notes (Signed)
Pre HD assessment    01/11/18 2152  Neurological  Level of Consciousness Alert  Orientation Level Oriented X4  Respiratory  Respiratory Pattern Labored  Chest Assessment Chest expansion symmetrical  Cardiac  ECG Monitor Yes  Vascular  R Radial Pulse +1  L Radial Pulse +1  Integumentary  Integumentary (WDL) X  Skin Color Appropriate for ethnicity  Skin Condition Dry;Flaky  Musculoskeletal  Musculoskeletal (WDL) X  Generalized Weakness Yes  Assistive Device None  GU Assessment  Genitourinary (WDL) X  Genitourinary Symptoms  (HD)  Psychosocial  Psychosocial (WDL) WDL

## 2018-01-11 NOTE — H&P (Addendum)
[facility] [place] NAME: Kimberly Shaffer    MR#:  [id number]  DATE OF BIRTH:  April 13, 1951  DATE OF ADMISSION:  01/11/2018  PRIMARY CARE PHYSICIAN: Shari Prows, [facility]   REQUESTING/REFERRING PHYSICIAN:   CHIEF COMPLAINT:   Chief Complaint  Patient presents with  . Shortness of Breath    HISTORY OF PRESENT ILLNESS: Kimberly Shaffer  is a 67 y.o. female with a known history of coronary artery disease, COPD, diabetes mellitus type 2, hypertension, hyperlipidemia, end-stage renal disease on dialysis presented to the emergency room with increased shortness of breath.  Patient missed dialysis on Tuesday and Thursday.  She usually gets dialyzed on Tuesday Thursday and Saturday and did not go.  She became short of breath and came to the emergency room her oxygen saturation was low she was put on BiPAP and stabilized.  Hospitalist service was consulted for further care.  PAST MEDICAL HISTORY:   Past Medical History:  Diagnosis Date  . Asthma   . CAD (coronary artery disease)   . Chronic lower back pain   . COPD (chronic obstructive pulmonary disease) ([place])   . Depression   . Diabetes mellitus without complication (HCC)    NIDD  . H/O angina pectoris   . H/O blood clots   . Hypercholesteremia   . Hypertension   . Left rotator cuff tear   . Myocardial infarction ([place])   . Obesity   . Renal insufficiency   . Stroke ([place])   . Vertigo, aural     PAST SURGICAL HISTORY:  Past Surgical History:  Procedure Laterality Date  . A/V FISTULAGRAM Left 03/14/2017   Procedure: A/V Fistulagram;  Surgeon: Katha Cabal, MD;  Location: [place] [facility];  Service: Cardiovascular;  Laterality: Left;  . AV FISTULA PLACEMENT Left 11-04-2015  . AV FISTULA PLACEMENT Left 11/04/2015   Procedure: ARTERIOVENOUS (AV) FISTULA CREATION  ( BRACHIAL CEPHALIC );  Surgeon: Katha Cabal, MD;  Location: [facility];  Service: Vascular;  Laterality: Left;   . CARDIAC CATHETERIZATION    . CARDIAC CATHETERIZATION Left 07/19/2016   Procedure: Left Heart Cath and Coronary Angiography;  Surgeon: Corey Skains, MD;  Location: [place] [facility];  Service: Cardiovascular;  Laterality: Left;  . CORONARY ARTERY BYPASS GRAFT  2012  . CORONARY STENT PLACEMENT    . JOINT REPLACEMENT    . KNEE SURGERY Bilateral   . LEFT HEART CATH AND CORONARY ANGIOGRAPHY N/A 12/25/2017   Procedure: LEFT HEART CATH AND CORONARY ANGIOGRAPHY;  Surgeon: Teodoro Spray, MD;  Location: [place] [facility];  Service: Cardiovascular;  Laterality: N/A;  . PERIPHERAL VASCULAR CATHETERIZATION N/A 11/13/2015   Procedure: Dialysis/Perma Catheter Insertion;  Surgeon: Katha Cabal, MD;  Location: [place] [facility];  Service: Cardiovascular;  Laterality: N/A;  . PERIPHERAL VASCULAR CATHETERIZATION N/A 12/04/2015   Procedure: Dialysis/Perma Catheter Insertion;  Surgeon: Katha Cabal, MD;  Location: [place] [facility];  Service: Cardiovascular;  Laterality: N/A;  . PERIPHERAL VASCULAR CATHETERIZATION Left 12/31/2015   Procedure: A/V Shuntogram/Fistulagram;  Surgeon: Algernon Huxley, MD;  Location: [place] [facility];  Service: Cardiovascular;  Laterality: Left;  . PERIPHERAL VASCULAR CATHETERIZATION N/A 12/31/2015   Procedure: A/V Shunt Intervention;  Surgeon: Algernon Huxley, MD;  Location: [place] [facility];  Service: Cardiovascular;  Laterality: N/A;  . PERIPHERAL VASCULAR CATHETERIZATION Left 02/25/2016   Procedure: A/V Shuntogram/Fistulagram;  Surgeon: Algernon Huxley, MD;  Location: [facility]  INVASIVE CV LAB;  Service: Cardiovascular;  Laterality: Left;  . PERIPHERAL VASCULAR CATHETERIZATION N/A 03/18/2016   Procedure: Dialysis/Perma Catheter Removal;  Surgeon: Katha Cabal, MD;  Location: [place] [facility];  Service: Cardiovascular;  Laterality: N/A;  . REMOVAL OF A DIALYSIS CATHETER N/A 06/30/2017   Procedure: REMOVAL OF A DIALYSIS CATHETER;  Surgeon: Katha Cabal, MD;   Location: [facility] OR[facility];  Service: Vascular;  Laterality: N/A;  . REPLACEMENT TOTAL KNEE BILATERAL Bilateral     SOCIAL HISTORY:  Social History   Tobacco Use  . Smoking status: Current Every Day Smoker    Packs/day: 0.25    Types: Cigarettes  . Smokeless tobacco: Never Used  Substance Use Topics  . Alcohol use: No    Alcohol/week: 0.0 oz    FAMILY HISTORY:  Family History  Problem Relation Age of Onset  . Cancer Mother   . Cancer Father   . Diabetes Brother   . Heart disease Brother     DRUG ALLERGIES:  Allergies  Allergen Reactions  . Nystatin Hives and Itching  . Sulfa Antibiotics Swelling, Hives and Rash  . Niaspan [Niacin] Other (See Comments) and Hives    REVIEW OF SYSTEMS:   CONSTITUTIONAL: No fever, fatigue or weakness.  EYES: No blurred or double vision.  EARS, NOSE, AND THROAT: No tinnitus or ear pain.  RESPIRATORY: Has occasional cough, shortness of breath,  No wheezing or hemoptysis.  CARDIOVASCULAR: No chest pain, orthopnea, edema.  GASTROINTESTINAL: No nausea, vomiting, diarrhea or abdominal pain.  GENITOURINARY: No dysuria, hematuria.  ENDOCRINE: No polyuria, nocturia,  HEMATOLOGY: No anemia, easy bruising or bleeding SKIN: No rash or lesion. MUSCULOSKELETAL: No joint pain or arthritis.   NEUROLOGIC: No tingling, numbness, weakness.  PSYCHIATRY: No anxiety or depression.   MEDICATIONS AT HOME:  Prior to Admission medications   Medication Sig Start Date End Date Taking? Authorizing Provider  albuterol (PROVENTIL HFA;VENTOLIN HFA) 108 (90 BASE) MCG/ACT inhaler Inhale 2 puffs into the lungs every 6 (six) hours as needed for wheezing or shortness of breath.    Yes [provider]  aspirin EC 81 MG tablet Take 81 mg by mouth daily.   Yes [provider]  calcium carbonate (OSCAL) 1500 (600 Ca) MG TABS tablet Take 600 mg of elemental calcium by mouth daily with breakfast.   Yes [provider]  clopidogrel (PLAVIX) 75 MG tablet  Take 1 tablet by mouth daily. 01/03/18  Yes [provider]  gabapentin (NEURONTIN) 100 MG capsule Take 100 mg by mouth at bedtime.   Yes [provider]  meloxicam (MOBIC) 15 MG tablet Take 1 tablet (15 mg total) by mouth daily. 11/16/17 11/16/18 Yes Fisher, Linden Dolin, PA-C  Oxycodone HCl 10 MG TABS Take 10 mg by mouth every 6 (six) hours as needed.  12/28/17  Yes [provider]  pantoprazole (PROTONIX) 40 MG tablet Take 40 mg by mouth 2 (two) times daily.   Yes [provider]  SPIRIVA HANDIHALER 18 MCG inhalation capsule Place 1 capsule into inhaler and inhale daily.   Yes [provider]  SYMBICORT 160-4.5 MCG/ACT inhaler Inhale 2 puffs into the lungs 2 (two) times daily.   Yes [provider]  venlafaxine XR (EFFEXOR-XR) 37.5 MG 24 hr capsule Take 37.5 mg by mouth daily.    Yes [provider]  HYDROcodone-acetaminophen (NORCO/VICODIN) 5-325 MG tablet Take 1 tablet by mouth every 6 (six) hours as needed for moderate pain. Patient not taking: Reported on 12/25/2017 07/01/17  07/01/18  Loletha Grayer, MD  ipratropium (ATROVENT) 0.02 % nebulizer solution Take 2.5 mLs (0.5 mg total) by nebulization every 6 (six) hours as needed for wheezing or shortness of breath. 12/24/17   Dustin Flock, MD  meclizine (ANTIVERT) 12.5 MG tablet Take 1 tablet (12.5 mg total) by mouth 3 (three) times daily as needed for dizziness or nausea. Patient taking differently: Take 25 mg by mouth 3 (three) times daily as needed for dizziness or nausea.  08/27/16   Daymon Larsen, MD  methocarbamol (ROBAXIN) 750 MG tablet Take 750 mg by mouth 2 (two) times daily as needed for muscle spasms.     [provider]  nicotine (NICODERM CQ - DOSED IN MG/24 HOURS) 21 mg/24hr patch Place 1 patch (21 mg total) onto the skin daily. Patient not taking: Reported on 01/11/2018 12/24/17   Dustin Flock, MD  nitroGLYCERIN (NITROLINGUAL) 0.4 MG/SPRAY spray Place 2 sprays under  the tongue every 5 (five) minutes x 3 doses as needed for chest pain.     [provider]  Nutritional Supplements (FEEDING SUPPLEMENT, NEPRO CARB STEADY,) LIQD Take 237 mLs by mouth 2 (two) times daily between meals. 12/08/17   Fritzi Mandes, MD      PHYSICAL EXAMINATION:   VITAL SIGNS: Blood pressure (!) 149/67, pulse 87, temperature 97.7 F (36.5 C), temperature source Axillary, resp. rate (!) 26, height 5\' 6"  (1.676 m), weight 103.4 kg (228 lb), SpO2 100 %.  GENERAL:  67 y.o.-year-old patient lying in the bed with no acute distress.  EYES: Pupils equal, round, reactive to light and accommodation. No scleral icterus. Extraocular muscles intact.  HEENT: Head atraumatic, normocephalic. Oropharynx and nasopharynx clear.  NECK:  Supple, no jugular venous distention. No thyroid enlargement, no tenderness.  LUNGS: Decreased breath sounds bilaterally, bilateral rales heard.  CARDIOVASCULAR: S1, S2 normal. No murmurs, rubs, or gallops.  ABDOMEN: Soft, nontender, nondistended. Bowel sounds present. No organomegaly or mass.  EXTREMITIES: Has pedal edema,  No cyanosis, or clubbing.  NEUROLOGIC: Cranial nerves II through XII are intact. Muscle strength 5/5 in all extremities. Sensation intact. Gait not checked.  PSYCHIATRIC: The patient is alert and oriented x 3.  SKIN: No obvious rash, lesion, or ulcer.   LABORATORY PANEL:   CBC Recent Labs  Lab 01/11/18 1804  WBC 12.5*  HGB 8.4*  HCT 26.0*  PLT 240  MCV 96.8  MCH 31.2  MCHC 32.2  RDW 16.1*  LYMPHSABS 2.0  MONOABS 1.0*  EOSABS 0.0  BASOSABS 0.1   ------------------------------------------------------------------------------------------------------------------  Chemistries  Recent Labs  Lab 01/11/18 1804  NA 135  K 5.9*  CL 106  CO2 14*  GLUCOSE 137*  BUN 98*  CREATININE 11.54*  CALCIUM 8.3*  AST 25  ALT 11*  ALKPHOS 73  BILITOT 0.6    ------------------------------------------------------------------------------------------------------------------ estimated creatinine clearance is 5.7 mL/min (A) (by C-G formula based on SCr of 11.54 mg/dL (H)). ------------------------------------------------------------------------------------------------------------------ No results for input(s): TSH, T4TOTAL, T3FREE, THYROIDAB in the last 72 hours.  Invalid input(s): FREET3   Coagulation profile No results for input(s): INR, PROTIME in the last 168 hours. ------------------------------------------------------------------------------------------------------------------- No results for input(s): DDIMER in the last 72 hours. -------------------------------------------------------------------------------------------------------------------  Cardiac Enzymes Recent Labs  Lab 01/11/18 1804  TROPONINI 0.16*   ------------------------------------------------------------------------------------------------------------------ Invalid input(s): POCBNP  ---------------------------------------------------------------------------------------------------------------  Urinalysis    Component Value Date/Time   COLORURINE YELLOW (A) 07/05/2016 2037   APPEARANCEUR HAZY (A) 07/05/2016 2037   APPEARANCEUR Clear 07/21/2013 0807   LABSPEC 1.012 07/05/2016 2037   LABSPEC  1.013 07/21/2013 0807   PHURINE 9.0 (H) 07/05/2016 2037   GLUCOSEU NEGATIVE 07/05/2016 2037   GLUCOSEU Negative 07/21/2013 0807   HGBUR NEGATIVE 07/05/2016 2037   BILIRUBINUR NEGATIVE 07/05/2016 2037   BILIRUBINUR Negative 07/21/2013 0807   KETONESUR NEGATIVE 07/05/2016 2037   PROTEINUR >500 (A) 07/05/2016 2037   NITRITE NEGATIVE 07/05/2016 2037   LEUKOCYTESUR NEGATIVE 07/05/2016 2037   LEUKOCYTESUR Negative 07/21/2013 0807     RADIOLOGY: Dg Chest Portable 1 View  Result Date: 01/11/2018 CLINICAL DATA:  Shortness of breath EXAM: PORTABLE CHEST 1 VIEW COMPARISON:   12/07/2017, 12/22/2017, 08/27/2016 FINDINGS: Cardiomegaly with vascular congestion. Moderate diffuse bilateral interstitial opacities suspicious for edema. Patchy focal airspace disease in the left mid lung and bilateral bases. Aortic atherosclerosis. No pneumothorax. IMPRESSION: 1. Cardiomegaly with vascular congestion and moderate diffuse interstitial opacity suspect for pulmonary edema 2. Patchy focal airspace disease in the left mid lung and bilateral bases, possible superimposed pneumonia. Electronically Signed   By: Donavan Foil M.D.   On: 01/11/2018 18:28    EKG: Orders placed or performed during the hospital encounter of 01/11/18  . EKG 12-Lead  . EKG 12-Lead    IMPRESSION AND PLAN:  67 year old female patient with end-stage renal disease on dialysis, hypertension, hyperlipidemia, diabetes mellitus type 2, COPD, coronary artery disease presented to the emergency room with increased shortness of breath.  Patient missed dialysis.  Acute hypoxic respiratory failure Continue BiPAP for respiratory failure Admit patient to stepdown unit Intensivist consultation  Acute pulmonary edema and fluid overload Secondary to missing dialysis Nephrology consult for dialysis  Hyperkalemia Oral Kayexalate   end-stage renal disease Continue dialysis  Anemia of chronic disease Monitor hemoglobin hematocrit  DVT prophylaxis with subcu heparin  Elevated troponin secondary to demand ischemia  Tobacco cessation consult with the patient for 6 minutes Nicotine patch offered   All the records are reviewed and case discussed with ED provider. Management plans discussed with the patient, family and they are in agreement.  CODE STATUS:Full code Code Status History    Date Active Date Inactive Code Status Order ID Comments User Context   12/25/2017 0835 12/25/2017 1743 Full Code [id number]  Teodoro Spray, MD Inpatient   12/23/2017 0927 12/25/2017 0834 Full Code [id number]  Harrie Foreman, MD  Inpatient   12/08/2017 0357 12/08/2017 2344 Full Code [id number]  Harrie Foreman, MD Inpatient   06/28/2017 0749 07/01/2017 2218 Full Code [id number]  Harrie Foreman, MD Inpatient   07/19/2016 0857 07/19/2016 1319 Full Code [id number]  Corey Skains, MD Inpatient   07/05/2016 2236 07/09/2016 1950 Full Code [id number]  Harvie Bridge, DO Inpatient   12/17/2015 0812 12/20/2015 1957 Full Code [id number]  Saundra Shelling, MD Inpatient       TOTAL CRITICAL CARE TIME TAKING CARE OF THIS PATIENT: 53 minutes.    Saundra Shelling M.D on 01/11/2018 at 7:24 PM  Between 7am to 6pm - Pager - [phone]  After 6pm go to [email or web address] - password [email or web address] [facility]  [facility]  Office  [phone]  CC: Primary care physician; [facility]

## 2018-01-11 NOTE — Consult Note (Signed)
Name: Kimberly Shaffer MRN: [id number] DOB: [birth year]    ADMISSION DATE:  01/11/2018 CONSULTATION DATE: 01/11/2018  REFERRING MD : Dr. Estanislado Pandy  CHIEF COMPLAINT: Shortness of Breath   BRIEF PATIENT DESCRIPTION:  67 yo female admitted with acute on chronic hypoxic respiratory failure secondary to pulmonary edema after missing 2 HD sessions requiring emergent HD and Bipap   SIGNIFICANT EVENTS  04/25-Pt admitted to stepdown unit   STUDIES:  None   HISTORY OF PRESENT ILLNESS:   This is a 67 yo female with a PMH of Stroke, ESRD on HD, Obesity, MI, HTN, Hypercholesteremia, Diabetes Mellitus, Depression, COPD, Chronic Lower Back Pain, CAD, and Asthma.  She presented to [facility] ER on 04/25 with shortness of breath unrelieved despite albuterol treatment at home.  In the ER due to respiratory failure she was placed on Bipap.  She missed her last 2 hemodialysis sessions. CXR revealing pulmonary edema, therefore nephrology consulted for emergent HD.  She was subsequently admitted to the stepdown unit for further workup and treatment.   PAST MEDICAL HISTORY :   has a past medical history of Asthma, CAD (coronary artery disease), Chronic lower back pain, COPD (chronic obstructive pulmonary disease) ([place]), Depression, Diabetes mellitus without complication ([place]), H/O angina pectoris, H/O blood clots, Hypercholesteremia, Hypertension, Left rotator cuff tear, Myocardial infarction ([place]), Obesity, Renal insufficiency, Stroke ([place]), and Vertigo, aural.  has a past surgical history that includes Cardiac catheterization; Coronary stent placement; Knee surgery (Bilateral); Replacement total knee bilateral (Bilateral); Coronary artery bypass graft (2012); Joint replacement; AV fistula placement (Left, 11-04-2015); AV fistula placement (Left, 11/04/2015); Cardiac catheterization (N/A, 11/13/2015); Cardiac catheterization (N/A, 12/04/2015); Cardiac catheterization (Left, 12/31/2015); Cardiac catheterization (N/A,  12/31/2015); Cardiac catheterization (Left, 02/25/2016); Cardiac catheterization (N/A, 03/18/2016); Cardiac catheterization (Left, 07/19/2016); A/V Fistulagram (Left, 03/14/2017); Removal of a dialysis catheter (N/A, 06/30/2017); and LEFT HEART CATH AND CORONARY ANGIOGRAPHY (N/A, 12/25/2017). Prior to Admission medications   Medication Sig Start Date End Date Taking? Authorizing Provider  albuterol (PROVENTIL HFA;VENTOLIN HFA) 108 (90 BASE) MCG/ACT inhaler Inhale 2 puffs into the lungs every 6 (six) hours as needed for wheezing or shortness of breath.    Yes [provider]  aspirin EC 81 MG tablet Take 81 mg by mouth daily.   Yes [provider]  calcium carbonate (OSCAL) 1500 (600 Ca) MG TABS tablet Take 600 mg of elemental calcium by mouth daily with breakfast.   Yes [provider]  clopidogrel (PLAVIX) 75 MG tablet Take 1 tablet by mouth daily. 01/03/18  Yes [provider]  gabapentin (NEURONTIN) 100 MG capsule Take 100 mg by mouth at bedtime.   Yes [provider]  meloxicam (MOBIC) 15 MG tablet Take 1 tablet (15 mg total) by mouth daily. 11/16/17 11/16/18 Yes Fisher, Linden Dolin, PA-C  Oxycodone HCl 10 MG TABS Take 10 mg by mouth every 6 (six) hours as needed.  12/28/17  Yes [provider]  pantoprazole (PROTONIX) 40 MG tablet Take 40 mg by mouth 2 (two) times daily.   Yes [provider]  SPIRIVA HANDIHALER 18 MCG inhalation capsule Place 1 capsule into inhaler and inhale daily.   Yes [provider]  SYMBICORT 160-4.5 MCG/ACT inhaler Inhale 2 puffs into the lungs 2 (two) times daily.   Yes [provider]  venlafaxine XR (EFFEXOR-XR) 37.5 MG 24 hr capsule Take 37.5 mg by mouth daily.    Yes [provider]  HYDROcodone-acetaminophen (NORCO/VICODIN) 5-325 MG tablet Take 1 tablet by mouth every 6 (six) hours  as needed for moderate pain. Patient not taking: Reported on 12/25/2017 07/01/17 07/01/18  Loletha Grayer, MD    ipratropium (ATROVENT) 0.02 % nebulizer solution Take 2.5 mLs (0.5 mg total) by nebulization every 6 (six) hours as needed for wheezing or shortness of breath. 12/24/17   Dustin Flock, MD  meclizine (ANTIVERT) 12.5 MG tablet Take 1 tablet (12.5 mg total) by mouth 3 (three) times daily as needed for dizziness or nausea. Patient taking differently: Take 25 mg by mouth 3 (three) times daily as needed for dizziness or nausea.  08/27/16   Daymon Larsen, MD  methocarbamol (ROBAXIN) 750 MG tablet Take 750 mg by mouth 2 (two) times daily as needed for muscle spasms.     [provider]  nicotine (NICODERM CQ - DOSED IN MG/24 HOURS) 21 mg/24hr patch Place 1 patch (21 mg total) onto the skin daily. Patient not taking: Reported on 01/11/2018 12/24/17   Dustin Flock, MD  nitroGLYCERIN (NITROLINGUAL) 0.4 MG/SPRAY spray Place 2 sprays under the tongue every 5 (five) minutes x 3 doses as needed for chest pain.     [provider]  Nutritional Supplements (FEEDING SUPPLEMENT, NEPRO CARB STEADY,) LIQD Take 237 mLs by mouth 2 (two) times daily between meals. 12/08/17   Fritzi Mandes, MD   Allergies  Allergen Reactions  . Nystatin Hives and Itching  . Sulfa Antibiotics Swelling, Hives and Rash  . Niaspan [Niacin] Other (See Comments) and Hives    FAMILY HISTORY:  family history includes Cancer in her father and mother; Diabetes in her brother; Heart disease in her brother. SOCIAL HISTORY:  reports that she has been smoking cigarettes.  She has been smoking about 0.25 packs per day. She has never used smokeless tobacco. She reports that she does not drink alcohol or use drugs.  REVIEW OF SYSTEMS: Positives in BOLD   Constitutional: Negative for fever, chills, weight loss, malaise/fatigue and diaphoresis.  HENT: Negative for hearing loss, ear pain, nosebleeds, congestion, sore throat, neck pain, tinnitus and ear discharge.   Eyes: Negative for blurred vision, double vision, photophobia, pain,  discharge and redness.  Respiratory: cough, hemoptysis, sputum production, shortness of breath, wheezing and stridor.   Cardiovascular: Negative for chest pain, palpitations, orthopnea, claudication, leg swelling and PND.  Gastrointestinal: Negative for heartburn, nausea, vomiting, abdominal pain, diarrhea, constipation, blood in stool and melena.  Genitourinary: Negative for dysuria, urgency, frequency, hematuria and flank pain.  Musculoskeletal: Negative for myalgias, back pain, joint pain and falls.  Skin: Negative for itching and rash.  Neurological: Negative for dizziness, tingling, tremors, sensory change, speech change, focal weakness, seizures, loss of consciousness, weakness and headaches.  Endo/Heme/Allergies: Negative for environmental allergies and polydipsia. Does not bruise/bleed easily.  SUBJECTIVE:  No complaints at this time  VITAL SIGNS: Temp:  [97.7 F (36.5 C)-98.8 F (37.1 C)] 98.5 F (36.9 C) (04/25 2150) Pulse Rate:  [78-100] 78 (04/25 2315) Resp:  [20-29] 20 (04/25 2315) BP: (132-155)/(67-86) 132/76 (04/25 2315) SpO2:  [92 %-100 %] 100 % (04/25 2315) Weight:  [102.2 kg (225 lb 5 oz)-103.4 kg (228 lb)] 102.6 kg (226 lb 3.1 oz) (04/25 2150)  PHYSICAL EXAMINATION: General: well developed, well nourished female, NAD on Bipap  Neuro: alert and oriented, follows commands  HEENT: supple, no JVD  Cardiovascular: nsr, rrr, no R/G Lungs: crackles throughout, even, non labored  Abdomen: +BS x4, obese, soft, non tender, non distended  Musculoskeletal: trace bilateral lower extremity edema  Skin: intact no rashes or lesions   Recent  Labs  Lab 01/11/18 1804  NA 135  K 5.9*  CL 106  CO2 14*  BUN 98*  CREATININE 11.54*  GLUCOSE 137*   Recent Labs  Lab 01/11/18 1804  HGB 8.4*  HCT 26.0*  WBC 12.5*  PLT 240   Dg Chest Portable 1 View  Result Date: 01/11/2018 CLINICAL DATA:  Shortness of breath EXAM: PORTABLE CHEST 1 VIEW COMPARISON:  12/07/2017,  12/22/2017, 08/27/2016 FINDINGS: Cardiomegaly with vascular congestion. Moderate diffuse bilateral interstitial opacities suspicious for edema. Patchy focal airspace disease in the left mid lung and bilateral bases. Aortic atherosclerosis. No pneumothorax. IMPRESSION: 1. Cardiomegaly with vascular congestion and moderate diffuse interstitial opacity suspect for pulmonary edema 2. Patchy focal airspace disease in the left mid lung and bilateral bases, possible superimposed pneumonia. Electronically Signed   By: Donavan Foil M.D.   On: 01/11/2018 18:28    ASSESSMENT / PLAN: Acute on chronic hypoxic respiratory failure secondary to pulmonary edema  ESRD on HD  Hyperkalemia  Elevated troponin likely demand ischemia secondary to respiratory failure Anemia without obvious acute blood loss  Hx: Stroke, COPD, and Asthma  P: Prn Bipap for dyspnea and/or hypoxia  Prn bronchodilator therapy Trend troponin's Continuous telemetry monitoring  Nephrology consulted appreciate input-HD per recommendations Trend BMP Replace electrolytes as indicated  Monitor UOP Avoid nephrotoxic medications VTE px: subq heparin  Trend CBC  Monitor for s/sx of bleeding and transfuse for hgb <7  Marda Stalker, [place] Pager [phone] (please enter 7 digits) PCCM Consult Pager [phone] (please enter 7 digits)

## 2018-01-11 NOTE — ED Triage Notes (Signed)
Pt arrived on cpap. PT's last dialysis treatment 4/20. Pt took 2 albuterol treatments prior to EMS arrival. EMS administered 2 duo nebs.

## 2018-01-12 ENCOUNTER — Inpatient Hospital Stay: Payer: Medicare HMO

## 2018-01-12 DIAGNOSIS — J181 Lobar pneumonia, unspecified organism: Secondary | ICD-10-CM

## 2018-01-12 DIAGNOSIS — I214 Non-ST elevation (NSTEMI) myocardial infarction: Secondary | ICD-10-CM

## 2018-01-12 DIAGNOSIS — J810 Acute pulmonary edema: Secondary | ICD-10-CM

## 2018-01-12 DIAGNOSIS — J9601 Acute respiratory failure with hypoxia: Secondary | ICD-10-CM

## 2018-01-12 LAB — POTASSIUM: POTASSIUM: 3.2 mmol/L — AB (ref 3.5–5.1)

## 2018-01-12 LAB — BASIC METABOLIC PANEL
Anion gap: 13 (ref 5–15)
BUN: 35 mg/dL — AB (ref 6–20)
CO2: 28 mmol/L (ref 22–32)
CREATININE: 5.93 mg/dL — AB (ref 0.44–1.00)
Calcium: 8.5 mg/dL — ABNORMAL LOW (ref 8.9–10.3)
Chloride: 95 mmol/L — ABNORMAL LOW (ref 101–111)
GFR, EST AFRICAN AMERICAN: 8 mL/min — AB (ref >60)
GFR, EST NON AFRICAN AMERICAN: 7 mL/min — AB (ref >60)
Glucose, Bld: 58 mg/dL — ABNORMAL LOW (ref 65–99)
POTASSIUM: 3.2 mmol/L — AB (ref 3.5–5.1)
SODIUM: 136 mmol/L (ref 135–145)

## 2018-01-12 LAB — CBC
HCT: 22.7 % — ABNORMAL LOW (ref 35.0–47.0)
HEMATOCRIT: 23.5 % — AB (ref 35.0–47.0)
Hemoglobin: 8.1 g/dL — ABNORMAL LOW (ref 12.0–16.0)
Hemoglobin: 7.6 g/dL — ABNORMAL LOW (ref 12.0–16.0)
MCH: 32 pg (ref 26.0–34.0)
MCH: 31.1 pg (ref 26.0–34.0)
MCHC: 34.4 g/dL (ref 32.0–36.0)
MCHC: 33.6 g/dL (ref 32.0–36.0)
MCV: 92.9 fL (ref 80.0–100.0)
MCV: 92.6 fL (ref 80.0–100.0)
PLATELETS: 197 10*3/uL (ref 150–440)
Platelets: 203 10*3/uL (ref 150–440)
RBC: 2.53 MIL/uL — ABNORMAL LOW (ref 3.80–5.20)
RBC: 2.45 MIL/uL — ABNORMAL LOW (ref 3.80–5.20)
RDW: 15.4 % — AB (ref 11.5–14.5)
RDW: 15.5 % — AB (ref 11.5–14.5)
WBC: 10.1 10*3/uL (ref 3.6–11.0)
WBC: 10.3 10*3/uL (ref 3.6–11.0)

## 2018-01-12 LAB — HEPARIN LEVEL (UNFRACTIONATED): Heparin Unfractionated: 0.12 IU/mL — ABNORMAL LOW (ref 0.30–0.70)

## 2018-01-12 LAB — GLUCOSE, CAPILLARY: Glucose-Capillary: 105 mg/dL — ABNORMAL HIGH (ref 65–99)

## 2018-01-12 LAB — TROPONIN I
TROPONIN I: 4.16 ng/mL — AB (ref <0.03)
Troponin I: 3.46 ng/mL (ref <0.03)

## 2018-01-12 LAB — PROTIME-INR
INR: 1.15
Prothrombin Time: 14.6 seconds (ref 11.4–15.2)

## 2018-01-12 LAB — APTT: APTT: 41 s — AB (ref 24–36)

## 2018-01-12 LAB — PROCALCITONIN: PROCALCITONIN: 4.76 ng/mL

## 2018-01-12 MED ORDER — HEPARIN BOLUS VIA INFUSION
2500.0000 [IU] | Freq: Once | INTRAVENOUS | Status: AC
  Administered 2018-01-12: 2500 [IU] via INTRAVENOUS
  Filled 2018-01-12: qty 2500

## 2018-01-12 MED ORDER — HEPARIN BOLUS VIA INFUSION
2000.0000 [IU] | Freq: Once | INTRAVENOUS | Status: AC
  Administered 2018-01-12: 2000 [IU] via INTRAVENOUS
  Filled 2018-01-12: qty 2000

## 2018-01-12 MED ORDER — HEPARIN (PORCINE) IN NACL 100-0.45 UNIT/ML-% IJ SOLN
1650.0000 [IU]/h | INTRAMUSCULAR | Status: DC
  Administered 2018-01-12: 1000 [IU]/h via INTRAVENOUS
  Administered 2018-01-14: 1650 [IU]/h via INTRAVENOUS
  Filled 2018-01-12 (×4): qty 250

## 2018-01-12 MED ORDER — SODIUM CHLORIDE 0.9 % IV SOLN
1.0000 g | INTRAVENOUS | Status: DC
  Administered 2018-01-13: 1 g via INTRAVENOUS
  Filled 2018-01-12 (×3): qty 1

## 2018-01-12 MED ORDER — EPOETIN ALFA 10000 UNIT/ML IJ SOLN
10000.0000 [IU] | INTRAMUSCULAR | Status: DC
  Filled 2018-01-12: qty 1

## 2018-01-12 MED ORDER — POTASSIUM CHLORIDE 20 MEQ PO PACK
60.0000 meq | PACK | Freq: Once | ORAL | Status: AC
  Administered 2018-01-12: 60 meq via ORAL
  Filled 2018-01-12: qty 3

## 2018-01-12 MED ORDER — SODIUM CHLORIDE 0.9 % IV SOLN
1.0000 g | INTRAVENOUS | Status: DC
  Administered 2018-01-12: 1 g via INTRAVENOUS
  Filled 2018-01-12 (×2): qty 1

## 2018-01-12 MED FILL — Medication: Qty: 1 | Status: AC

## 2018-01-12 NOTE — Consult Note (Signed)
[facility] Card[facility]  CARDIOLOGY CONSULT NOTE  Patient ID: Kimberly Shaffer MRN: [id number] DOB/AGE: 67/07/1951 67 y.o.  Admit date: 01/11/2018 Referring Physician Posey Pronto Primary Physician [facility] primary care Primary Cardiologist Nehemiah Massed Reason for Consultation elevated troponin  HPI: 67 year old female referred for evaluation of elevated troponin.  The patient presented to [facility] emergency room with fluid overload and shortness of breath after missing 2 dialysis days.  In the emergency room, patient was noted to be hypoxic and treated with BiPAP.  ECG revealed sinus rhythm with nonspecific ST abnormalities laterally.  Patient denied chest pain.  Initial troponin was 0.70.  Follow-up troponins were 3.46 and 4.16.  Patient continues to deny chest pain.  Underwent recent cardiac catheterization 12/24/2017 which revealed occluded LIMA graft to LAD, occluded RCA, patent stent left main to LAD and pain stent left circumflex with 70% stenosis D1.  Echocardiogram 12/24/2017 revealed normal left ventricular function, with LVEF of 50 to 55%.  Review of systems complete and found to be negative unless listed above     Past Medical History:  Diagnosis Date  . Asthma   . CAD (coronary artery disease)   . Chronic lower back pain   . COPD (chronic obstructive pulmonary disease) ([place])   . Depression   . Diabetes mellitus without complication (HCC)    NIDD  . H/O angina pectoris   . H/O blood clots   . Hypercholesteremia   . Hypertension   . Left rotator cuff tear   . Myocardial infarction ([place])   . Obesity   . Renal insufficiency   . Stroke ([place])   . Vertigo, aural     Past Surgical History:  Procedure Laterality Date  . A/V FISTULAGRAM Left 03/14/2017   Procedure: A/V Fistulagram;  Surgeon: Katha Cabal, MD;  Location: [place] [facility];  Service: Cardiovascular;  Laterality: Left;  . AV FISTULA PLACEMENT Left 11-04-2015  . AV FISTULA PLACEMENT Left 11/04/2015   Procedure: ARTERIOVENOUS (AV)  FISTULA CREATION  ( BRACHIAL CEPHALIC );  Surgeon: Katha Cabal, MD;  Location: [facility] ORS;  Service: Vascular;  Laterality: Left;  . CARDIAC CATHETERIZATION    . CARDIAC CATHETERIZATION Left 07/19/2016   Procedure: Left Heart Cath and Coronary Angiography;  Surgeon: Corey Skains, MD;  Location: [place] [facility];  Service: Cardiovascular;  Laterality: Left;  . CORONARY ARTERY BYPASS GRAFT  2012  . CORONARY STENT PLACEMENT    . JOINT REPLACEMENT    . KNEE SURGERY Bilateral   . LEFT HEART CATH AND CORONARY ANGIOGRAPHY N/A 12/25/2017   Procedure: LEFT HEART CATH AND CORONARY ANGIOGRAPHY;  Surgeon: Teodoro Spray, MD;  Location: [place] [facility];  Service: Cardiovascular;  Laterality: N/A;  . PERIPHERAL VASCULAR CATHETERIZATION N/A 11/13/2015   Procedure: Dialysis/Perma Catheter Insertion;  Surgeon: Katha Cabal, MD;  Location: [place] [facility];  Service: Cardiovascular;  Laterality: N/A;  . PERIPHERAL VASCULAR CATHETERIZATION N/A 12/04/2015   Procedure: Dialysis/Perma Catheter Insertion;  Surgeon: Katha Cabal, MD;  Location: [place] [facility];  Service: Cardiovascular;  Laterality: N/A;  . PERIPHERAL VASCULAR CATHETERIZATION Left 12/31/2015   Procedure: A/V Shuntogram/Fistulagram;  Surgeon: Algernon Huxley, MD;  Location: [place] [facility];  Service: Cardiovascular;  Laterality: Left;  . PERIPHERAL VASCULAR CATHETERIZATION N/A 12/31/2015   Procedure: A/V Shunt Intervention;  Surgeon: Algernon Huxley, MD;  Location: [place] [facility];  Service: Cardiovascular;  Laterality: N/A;  . PERIPHERAL VASCULAR CATHETERIZATION Left 02/25/2016   Procedure: A/V Shuntogram/Fistulagram;  Surgeon:  Algernon Huxley, MD;  Location: [place] [facility];  Service: Cardiovascular;  Laterality: Left;  . PERIPHERAL VASCULAR CATHETERIZATION N/A 03/18/2016   Procedure: Dialysis/Perma Catheter Removal;  Surgeon: Katha Cabal, MD;  Location: [place] [facility];  Service: Cardiovascular;  Laterality:  N/A;  . REMOVAL OF A DIALYSIS CATHETER N/A 06/30/2017   Procedure: REMOVAL OF A DIALYSIS CATHETER;  Surgeon: Katha Cabal, MD;  Location: [facility] ORS;  Service: Vascular;  Laterality: N/A;  . REPLACEMENT TOTAL KNEE BILATERAL Bilateral     Medications Prior to Admission  Medication Sig Dispense Refill Last Dose  . albuterol (PROVENTIL HFA;VENTOLIN HFA) 108 (90 BASE) MCG/ACT inhaler Inhale 2 puffs into the lungs every 6 (six) hours as needed for wheezing or shortness of breath.    01/11/2018 at Unknown time  . aspirin EC 81 MG tablet Take 81 mg by mouth daily.   Past Month at Unknown time  . calcium carbonate (OSCAL) 1500 (600 Ca) MG TABS tablet Take 600 mg of elemental calcium by mouth daily with breakfast.   01/11/2018 at Unknown time  . clopidogrel (PLAVIX) 75 MG tablet Take 1 tablet by mouth daily.  3 01/11/2018 at Unknown time  . gabapentin (NEURONTIN) 100 MG capsule Take 100 mg by mouth at bedtime.   01/10/2018 at Unknown time  . meloxicam (MOBIC) 15 MG tablet Take 1 tablet (15 mg total) by mouth daily. 30 tablet 2 01/11/2018 at Unknown time  . Oxycodone HCl 10 MG TABS Take 10 mg by mouth every 6 (six) hours as needed.    01/11/2018 at Unknown time  . pantoprazole (PROTONIX) 40 MG tablet Take 40 mg by mouth 2 (two) times daily.   01/11/2018 at Unknown time  . SPIRIVA HANDIHALER 18 MCG inhalation capsule Place 1 capsule into inhaler and inhale daily.   01/11/2018 at Unknown time  . SYMBICORT 160-4.5 MCG/ACT inhaler Inhale 2 puffs into the lungs 2 (two) times daily.   01/11/2018 at Unknown time  . venlafaxine XR (EFFEXOR-XR) 37.5 MG 24 hr capsule Take 37.5 mg by mouth daily.    01/11/2018 at Unknown time  . HYDROcodone-acetaminophen (NORCO/VICODIN) 5-325 MG tablet Take 1 tablet by mouth every 6 (six) hours as needed for moderate pain. (Patient not taking: Reported on 12/25/2017) 12 tablet 0 Not Taking at Unknown time  . ipratropium (ATROVENT) 0.02 % nebulizer solution Take 2.5 mLs (0.5 mg total) by  nebulization every 6 (six) hours as needed for wheezing or shortness of breath. 125 mL 5 PRN at PRN  . meclizine (ANTIVERT) 12.5 MG tablet Take 1 tablet (12.5 mg total) by mouth 3 (three) times daily as needed for dizziness or nausea. (Patient taking differently: Take 25 mg by mouth 3 (three) times daily as needed for dizziness or nausea. ) 30 tablet 1 prn at prn  . methocarbamol (ROBAXIN) 750 MG tablet Take 750 mg by mouth 2 (two) times daily as needed for muscle spasms.    prn at prn  . nicotine (NICODERM CQ - DOSED IN MG/24 HOURS) 21 mg/24hr patch Place 1 patch (21 mg total) onto the skin daily. (Patient not taking: Reported on 01/11/2018) 28 patch 0 Not Taking at Unknown time  . nitroGLYCERIN (NITROLINGUAL) 0.4 MG/SPRAY spray Place 2 sprays under the tongue every 5 (five) minutes x 3 doses as needed for chest pain.    prn at prn  . Nutritional Supplements (FEEDING SUPPLEMENT, NEPRO CARB STEADY,) LIQD Take 237 mLs by mouth 2 (two) times daily between meals. [place]  Can 0 prn at prn   Social History   Socioeconomic History  . Marital status: Widowed    Spouse name: Not on file  . Number of children: Not on file  . Years of education: Not on file  . Highest education level: Not on file  Occupational History  . Occupation: retired  Scientific laboratory technician  . Financial resource strain: Not on file  . Food insecurity:    Worry: Not on file    Inability: Not on file  . Transportation needs:    Medical: Not on file    Non-medical: Not on file  Tobacco Use  . Smoking status: Current Every Day Smoker    Packs/day: 0.25    Types: Cigarettes  . Smokeless tobacco: Never Used  Substance and Sexual Activity  . Alcohol use: No    Alcohol/week: 0.0 oz  . Drug use: No  . Sexual activity: Yes  Lifestyle  . Physical activity:    Days per week: Not on file    Minutes per session: Not on file  . Stress: Not on file  Relationships  . Social connections:    Talks on phone: Not on file    Gets together: Not on  file    Attends religious service: Not on file    Active member of club or organization: Not on file    Attends meetings of clubs or organizations: Not on file    Relationship status: Not on file  . Intimate partner violence:    Fear of current or ex partner: Not on file    Emotionally abused: Not on file    Physically abused: Not on file    Forced sexual activity: Not on file  Other Topics Concern  . Not on file  Social History Narrative  . Not on file    Family History  Problem Relation Age of Onset  . Cancer Mother   . Cancer Father   . Diabetes Brother   . Heart disease Brother       Review of systems complete and found to be negative unless listed above      PHYSICAL EXAM  General: Well developed, well nourished, in no acute distress HEENT:  Normocephalic and atramatic Neck:  No JVD.  Lungs: Clear bilaterally to auscultation and percussion. Heart: HRRR . Normal S1 and S2 without gallops or murmurs.  Abdomen: Bowel sounds are positive, abdomen soft and non-tender  Msk:  Back normal, normal gait. Normal strength and tone for age. Extremities: No clubbing, cyanosis or edema.   Neuro: Alert and oriented X 3. Psych:  Good affect, responds appropriately  Labs:   Lab Results  Component Value Date   WBC 10.3 01/12/2018   HGB 7.6 (L) 01/12/2018   HCT 22.7 (L) 01/12/2018   MCV 92.6 01/12/2018   PLT 197 01/12/2018    Recent Labs  Lab 01/11/18 1804 01/12/18 0836  NA 135 136  K 5.9* 3.2*  CL 106 95*  CO2 14* 28  BUN 98* 35*  CREATININE 11.54* 5.93*  CALCIUM 8.3* 8.5*  PROT 9.0*  --   BILITOT 0.6  --   ALKPHOS 73  --   ALT 11*  --   AST 25  --   GLUCOSE 137* 58*   Lab Results  Component Value Date   CKTOTAL 164 04/08/2014   CKMB 2.4 04/08/2014   TROPONINI 4.16 (HH) 01/12/2018    Lab Results  Component Value Date   CHOL 118 12/17/2015   CHOL  138 04/09/2014   CHOL 146 07/22/2013   Lab Results  Component Value Date   HDL 24 (L) 12/17/2015   HDL  39 (L) 04/09/2014   HDL 30 (L) 07/22/2013   Lab Results  Component Value Date   LDLCALC 66 12/17/2015   LDLCALC 75 04/09/2014   LDLCALC 88 07/22/2013   Lab Results  Component Value Date   TRIG 138 12/17/2015   TRIG 119 04/09/2014   TRIG 140 07/22/2013   Lab Results  Component Value Date   CHOLHDL 4.9 12/17/2015   No results found for: LDLDIRECT    Radiology: Dg Chest 1 View  Result Date: 12/22/2017 CLINICAL DATA:  Left-sided chest pain radiating to the neck and arm for several months. EXAM: CHEST  1 VIEW COMPARISON:  12/07/2017 FINDINGS: Stable cardiomegaly without acute pneumonic consolidation or CHF. Aortic atherosclerosis at the arch without aneurysm. Calcific tendinopathy or bursitis about the left shoulder. Minimal undersurface spurring off the distal clavicle at the [facility] joints. No acute osseous abnormality. IMPRESSION: Aortic atherosclerosis with stable cardiomegaly. No active pulmonary disease. Left rotator cuff calcific tendinopathy or calcific bursitis noted. Electronically Signed   By: Ashley Royalty M.D.   On: 12/22/2017 22:57   Dg Chest Port 1 View  Result Date: 01/12/2018 CLINICAL DATA:  Patient admitted with shortness of breath 01/11/2018. EXAM: PORTABLE CHEST 1 VIEW COMPARISON:  Single-view of the chest 01/11/2018 and 12/22/2017. FINDINGS: Marked cardiomegaly is again seen. Bilateral airspace disease is improved. No pneumothorax or pleural effusion. Lungs appear emphysematous. IMPRESSION: Improved bilateral airspace disease compatible with resolving edema and/or pneumonia. Cardiomegaly. Emphysema. Electronically Signed   By: Inge Rise M.D.   On: 01/12/2018 09:23   Dg Chest Portable 1 View  Result Date: 01/11/2018 CLINICAL DATA:  Shortness of breath EXAM: PORTABLE CHEST 1 VIEW COMPARISON:  12/07/2017, 12/22/2017, 08/27/2016 FINDINGS: Cardiomegaly with vascular congestion. Moderate diffuse bilateral interstitial opacities suspicious for edema. Patchy focal airspace  disease in the left mid lung and bilateral bases. Aortic atherosclerosis. No pneumothorax. IMPRESSION: 1. Cardiomegaly with vascular congestion and moderate diffuse interstitial opacity suspect for pulmonary edema 2. Patchy focal airspace disease in the left mid lung and bilateral bases, possible superimposed pneumonia. Electronically Signed   By: Donavan Foil M.D.   On: 01/11/2018 18:28    EKG: Normal sinus rhythm with nonspecific ST abnormalities inferolaterally  ASSESSMENT AND PLAN:   1.  Elevated troponin, in the absence of chest pain, or new ECG changes, patient who just recently underwent cardiac catheterization which revealed a chronically occluded LIMA to LAD, chronically occluded RCA, with patent stents left main to LAD and left circumflex, currently without chest pain, in the setting of fluid overload and pulmonary edema after missing 2 dialysis 2.  Respiratory failure, pulmonary edema, secondary to noncompliance with hemodialysis  Recommendations  1.  Agree with overall current therapy 2.  Continue heparin 48 to 72 hours 3.  Add Plavix 75 mg daily 4.  Defer repeat cardiac catheterization  Signed: Isaias Cowman MD,PhD, [facility] 01/12/2018, 12:50 PM

## 2018-01-12 NOTE — Progress Notes (Signed)
Post HD assessment. Pt tolerated tx well without c/o or complications. Net UF 2011, goal met.    01/12/18 0228  Vital Signs  Temp 99.8 F (37.7 C)  Temp Source Axillary  Pulse Rate 91  Pulse Rate Source Monitor  Resp 18  BP (!) 183/93  BP Location Right Arm  BP Method Automatic  Patient Position (if appropriate) Lying  Oxygen Therapy  SpO2 100 %  O2 Device Bi-PAP  Dialysis Weight  Weight 101.5 kg (223 lb 12.3 oz)  Type of Weight Post-Dialysis  Post-Hemodialysis Assessment  Rinseback Volume (mL) 250 mL  KECN 78.2 V  Dialyzer Clearance Lightly streaked  Duration of HD Treatment -hour(s) 3.5 hour(s)  Hemodialysis Intake (mL) 500 mL  UF Total -Machine (mL) 2511 mL  Net UF (mL) 2011 mL  Tolerated HD Treatment Yes  AVG/AVF Arterial Site Held (minutes) 10 minutes  AVG/AVF Venous Site Held (minutes) 10 minutes  Education / Care Plan  Dialysis Education Provided Yes  Documented Education in Care Plan Yes  Fistula / Graft Right Upper arm Arteriovenous vein graft  No Placement Date or Time found.   Placed prior to admission: Yes  Orientation: Right  Access Location: Upper arm  Access Type: Arteriovenous vein graft  Site Condition No complications  Fistula / Graft Assessment Present;Thrill;Bruit  Status Deaccessed  Drainage Description None

## 2018-01-12 NOTE — Progress Notes (Signed)
ANTICOAGULATION CONSULT NOTE   Pharmacy Consult for Heparin Drip Management Indication: Acute MI   Allergies  Allergen Reactions  . Nystatin Hives and Itching  . Sulfa Antibiotics Swelling, Hives and Rash  . Niaspan [Niacin] Other (See Comments) and Hives    Patient Measurements: Height: 5\' 6"  (167.6 cm) Weight: 223 lb 12.3 oz (101.5 kg) IBW/kg (Calculated) : 59.3 Heparin Dosing Weight: 82.34 kg  Vital Signs: Temp: 98.2 F (36.8 C) (04/26 0300) Temp Source: Oral (04/26 0300) BP: 141/73 (04/26 0600) Pulse Rate: 86 (04/26 0600)  Labs: Recent Labs    01/11/18 1804 01/11/18 2212 01/12/18 0541  HGB 8.4*  --   --   HCT 26.0*  --   --   PLT 240  --   --   CREATININE 11.54*  --   --   TROPONINI 0.16* 0.70* 3.46*    Estimated Creatinine Clearance: 5.7 mL/min (A) (by C-G formula based on SCr of 11.54 mg/dL (H)).   Medications:  Scheduled:  . aspirin EC  81 mg Oral Daily  . calcium carbonate  600 mg of elemental calcium Oral Q breakfast  . cinacalcet  30 mg Oral Daily  . clopidogrel  75 mg Oral Daily  . feeding supplement (NEPRO CARB STEADY)  237 mL Oral BID BM  . gabapentin  100 mg Oral QHS  . heparin  2,000 Units Intravenous Once  . nicotine  21 mg Transdermal Daily  . rosuvastatin  20 mg Oral Daily  . sevelamer carbonate  2.4 g Oral BID WC  . sodium chloride flush  3 mL Intravenous Q12H  . sodium polystyrene  30 g Oral Once  . venlafaxine XR  37.5 mg Oral QAC breakfast    Assessment: Pharmacy consulted for heparin drip initiation for a 67 year old female. Heparin drip ordered for acute MI. Patient received 5000 units subQ at 0522 this morning. No oral anticoagulants listed in PTA meds.   Goal of Therapy:  Heparin level goal = 0.3-0.7 units/ml Monitor platelets by anticoagulation protocol: Yes  Plan:  Patient received heparin 5000 units subQ this morning at [place]. Therefore, will give heparin 2000 unit bolus (half normal bolus) followed by heparin drip at 1000  units/hr. Will obtain follow-up anti-Xa level in 8 hours.   Pharmacy will continue to monitor and adjust per consult.   Kathyrn Sheriff 01/12/2018,8:51 AM

## 2018-01-12 NOTE — Progress Notes (Signed)
HD tx end   01/12/18 0215  Vital Signs  Pulse Rate 95  Pulse Rate Source Monitor  Resp (!) 23  BP 123/70  BP Location Right Arm  BP Method Automatic  Patient Position (if appropriate) Lying  Oxygen Therapy  SpO2 100 %  O2 Device Bi-PAP  During Hemodialysis Assessment  Dialysis Fluid Bolus Normal Saline  Bolus Amount (mL) 250 mL  Intra-Hemodialysis Comments Tx completed

## 2018-01-12 NOTE — Progress Notes (Signed)
Patient desats when she falls asleep. O2 bumped up to 6 L via [place] and will sating in the low 90s.

## 2018-01-12 NOTE — Progress Notes (Signed)
[place] at [place] NAME: Kimberly Shaffer    MR#:  [id number]  DATE OF BIRTH:  25-Sep-1950  SUBJECTIVE:   Doing well.now on high flow nasal cannula oxygen. Denies any chest pain REVIEW OF SYSTEMS:   Review of Systems  Constitutional: Negative for chills, fever and weight loss.  HENT: Negative for ear discharge, ear pain and nosebleeds.   Eyes: Negative for blurred vision, pain and discharge.  Respiratory: Positive for shortness of breath. Negative for sputum production, wheezing and stridor.   Cardiovascular: Negative for chest pain, palpitations, orthopnea and PND.  Gastrointestinal: Negative for abdominal pain, diarrhea, nausea and vomiting.  Genitourinary: Negative for frequency and urgency.  Musculoskeletal: Negative for back pain and joint pain.  Neurological: Negative for sensory change, speech change, focal weakness and weakness.  Psychiatric/Behavioral: Negative for depression and hallucinations. The patient is not nervous/anxious.    Tolerating Diet:yes Tolerating PT: pending  DRUG ALLERGIES:   Allergies  Allergen Reactions  . Nystatin Hives and Itching  . Sulfa Antibiotics Swelling, Hives and Rash  . Niaspan [Niacin] Other (See Comments) and Hives    VITALS:  Blood pressure 112/73, pulse 82, temperature 98.2 F (36.8 C), temperature source Oral, resp. rate 17, height 5\' 6"  (1.676 m), weight 101.5 kg (223 lb 12.3 oz), SpO2 100 %.  PHYSICAL EXAMINATION:   Physical Exam  GENERAL:  67 y.o.-year-old patient lying in the bed with no acute distress.  EYES: Pupils equal, round, reactive to light and accommodation. No scleral icterus. Extraocular muscles intact.  HEENT: Head atraumatic, normocephalic. Oropharynx and nasopharynx clear.  NECK:  Supple, no jugular venous distention. No thyroid enlargement, no tenderness.  LUNGS: distant breath sounds bilaterally, no wheezing, rales, rhonchi. No use of accessory muscles of  respiration.  CARDIOVASCULAR: S1, S2 normal. No murmurs, rubs, or gallops.  ABDOMEN: Soft, nontender, nondistended. Bowel sounds present. No organomegaly or mass.  EXTREMITIES: No cyanosis, clubbing or edema b/l.    NEUROLOGIC: Cranial nerves II through XII are intact. No focal Motor or sensory deficits b/l.   PSYCHIATRIC:  patient is alert and oriented x 3.  SKIN: No obvious rash, lesion, or ulcer.   LABORATORY PANEL:  CBC Recent Labs  Lab 01/12/18 0950  WBC 10.3  HGB 7.6*  HCT 22.7*  PLT 197    Chemistries  Recent Labs  Lab 01/11/18 1804 01/12/18 0836  NA 135 136  K 5.9* 3.2*  CL 106 95*  CO2 14* 28  GLUCOSE 137* 58*  BUN 98* 35*  CREATININE 11.54* 5.93*  CALCIUM 8.3* 8.5*  AST 25  --   ALT 11*  --   ALKPHOS 73  --   BILITOT 0.6  --    Cardiac Enzymes Recent Labs  Lab 01/12/18 0836  TROPONINI 4.16*   RADIOLOGY:  Dg Chest Port 1 View  Result Date: 01/12/2018 CLINICAL DATA:  Patient admitted with shortness of breath 01/11/2018. EXAM: PORTABLE CHEST 1 VIEW COMPARISON:  Single-view of the chest 01/11/2018 and 12/22/2017. FINDINGS: Marked cardiomegaly is again seen. Bilateral airspace disease is improved. No pneumothorax or pleural effusion. Lungs appear emphysematous. IMPRESSION: Improved bilateral airspace disease compatible with resolving edema and/or pneumonia. Cardiomegaly. Emphysema. Electronically Signed   By: Inge Rise M.D.   On: 01/12/2018 09:23   Dg Chest Portable 1 View  Result Date: 01/11/2018 CLINICAL DATA:  Shortness of breath EXAM: PORTABLE CHEST 1 VIEW COMPARISON:  12/07/2017, 12/22/2017, 08/27/2016 FINDINGS: Cardiomegaly with vascular congestion. Moderate diffuse bilateral interstitial  opacities suspicious for edema. Patchy focal airspace disease in the left mid lung and bilateral bases. Aortic atherosclerosis. No pneumothorax. IMPRESSION: 1. Cardiomegaly with vascular congestion and moderate diffuse interstitial opacity suspect for pulmonary  edema 2. Patchy focal airspace disease in the left mid lung and bilateral bases, possible superimposed pneumonia. Electronically Signed   By: Donavan Foil M.D.   On: 01/11/2018 18:28   ASSESSMENT AND PLAN:   Kimberly Shaffer  is a 67 y.o. female with a known history of coronary artery disease, COPD, diabetes mellitus type 2, hypertension, hyperlipidemia, end-stage renal disease on dialysis presented to the emergency room with increased shortness of breath.  Patient missed dialysis on Tuesday and Thursday  *Acute hypoxic respiratory failure Continue BiPAP for respiratory failure-- patient now on high flow nasal cannula oxygen  Intensivist consultation appreciated  *Acute pulmonary edema and fluid overload Secondary to missing dialysis--- patient go to urgent hemodialysis yesterday and today with ultrafiltration. Her stats are improving. Nephrology consult appreciated.  *Elevated troponin and absence of chest pain or new EKG changes. Patient was seen by Dr. Saralyn Pilar. Recommends continue heparin for 48 to 72 hours. I had a Plavix. Patient recently had all cardiac catheterization that revealed chronically occluded [name] to LAD and chronically occluded RCA with patent stands to LAD and left circumflex thereforecardiology recommend survey for cardiac catheterization  *Hyperkalemia Oral Kayexalate   *end-stage renal disease Continue dialysis  *Anemia of chronic disease Monitor hemoglobin hematocrit  *DVT prophylaxis with subcu heparin   Tobacco cessation consult with the patient for 6 minutes   Case discussed with Care Management/Social Worker. Management plans discussed with the patient, family and they are in agreement.  CODE STATUS: FULL  DVT Prophylaxis: Heparin  TOTAL TIME TAKING CARE OF THIS PATIENT: 35* minutes.  >50% time spent on counselling and coordination of care  POSSIBLE D/C IN few DAYS, DEPENDING ON CLINICAL CONDITION.  Note: This dictation was prepared with  Dragon dictation along with smaller phrase technology. Any transcriptional errors that result from this process are unintentional.  Fritzi Mandes M.D on 01/12/2018 at 4:53 PM  Between 7am to 6pm - Pager - [phone]  After 6pm go to [email or web address] - password [email or web address] [place] [facility]  Office  [phone]  CC: Primary care physician; Shari Prows, [facility] Primary CarePatient ID: Kimberly Shaffer, female   DOB: 1951-06-05, 67 y.o.   MRN: [id number]

## 2018-01-12 NOTE — Progress Notes (Signed)
This note also relates to the following rows which could not be included: Resp - Cannot attach notes to unvalidated device data  Hd completed

## 2018-01-12 NOTE — Progress Notes (Signed)
This note also relates to the following rows which could not be included: Pulse Rate - Cannot attach notes to unvalidated device data Resp - Cannot attach notes to unvalidated device data SpO2 - Cannot attach notes to unvalidated device data  Hd started  

## 2018-01-12 NOTE — Progress Notes (Signed)
MD Celesta Aver.  Aware of Troponin. No further orders at this time.

## 2018-01-12 NOTE — Progress Notes (Signed)
Name: Kimberly Shaffer MRN: [id number] DOB: [birth year]    ADMISSION DATE:  01/11/2018 CONSULTATION DATE: 01/11/2018  REFERRING MD : Dr. Estanislado Pandy  CHIEF COMPLAINT: Shortness of Breath   BRIEF PATIENT DESCRIPTION:  67 yo female admitted with acute on chronic hypoxic respiratory failure secondary to pulmonary edema after missing 2 HD sessions requiring emergent HD and Bipap   SIGNIFICANT EVENTS  04/25-Pt admitted to stepdown unit   STUDIES:  None   HISTORY OF PRESENT ILLNESS:   This is a 67 yo female with a PMH of Stroke, ESRD on HD, Obesity, MI, HTN, Hypercholesteremia, Diabetes Mellitus, Depression, COPD, Chronic Lower Back Pain, CAD, and Asthma.  She presented to [facility] ER on 04/25 with shortness of breath unrelieved despite albuterol treatment at home.  In the ER due to respiratory failure she was placed on Bipap.  She missed her last 2 hemodialysis sessions. CXR revealing pulmonary edema, therefore nephrology consulted for emergent HD.  She was subsequently admitted to the stepdown unit for further workup and treatment.    REVIEW OF SYSTEMS:  Reports dysnoea but denies chest pain, epigastric pain or abdominal pain.  SUBJECTIVE:  No complaints at this time  VITAL SIGNS: Temp:  [97.7 F (36.5 C)-99.8 F (37.7 C)] 98.2 F (36.8 C) (04/26 0300) Pulse Rate:  [78-110] 82 (04/26 1300) Resp:  [13-29] 17 (04/26 1300) BP: (85-183)/(50-93) 112/73 (04/26 1300) SpO2:  [81 %-100 %] 100 % (04/26 1300) FiO2 (%):  [50 %] 50 % (04/26 0619) Weight:  [223 lb 12.3 oz (101.5 kg)-228 lb (103.4 kg)] 223 lb 12.3 oz (101.5 kg) (04/26 0228)  PHYSICAL EXAMINATION: General: well developed, well nourished female, NAD on Bipap  Neuro: alert and oriented, follows commands  HEENT: supple, no JVD  Cardiovascular: nsr, rrr, no R/G Lungs: crackles throughout, even, non labored  Abdomen: +BS x4, obese, soft, non tender, non distended  Musculoskeletal: trace bilateral lower extremity edema  Skin: intact no  rashes or lesions   Recent Labs  Lab 01/11/18 1804 01/12/18 0836  NA 135 136  K 5.9* 3.2*  CL 106 95*  CO2 14* 28  BUN 98* 35*  CREATININE 11.54* 5.93*  GLUCOSE 137* 58*   Recent Labs  Lab 01/11/18 1804 01/12/18 0836 01/12/18 0950  HGB 8.4* 8.1* 7.6*  HCT 26.0* 23.5* 22.7*  WBC 12.5* 10.1 10.3  PLT 240 203 197   Dg Chest Port 1 View  Result Date: 01/12/2018 CLINICAL DATA:  Patient admitted with shortness of breath 01/11/2018. EXAM: PORTABLE CHEST 1 VIEW COMPARISON:  Single-view of the chest 01/11/2018 and 12/22/2017. FINDINGS: Marked cardiomegaly is again seen. Bilateral airspace disease is improved. No pneumothorax or pleural effusion. Lungs appear emphysematous. IMPRESSION: Improved bilateral airspace disease compatible with resolving edema and/or pneumonia. Cardiomegaly. Emphysema. Electronically Signed   By: Inge Rise M.D.   On: 01/12/2018 09:23   Dg Chest Portable 1 View  Result Date: 01/11/2018 CLINICAL DATA:  Shortness of breath EXAM: PORTABLE CHEST 1 VIEW COMPARISON:  12/07/2017, 12/22/2017, 08/27/2016 FINDINGS: Cardiomegaly with vascular congestion. Moderate diffuse bilateral interstitial opacities suspicious for edema. Patchy focal airspace disease in the left mid lung and bilateral bases. Aortic atherosclerosis. No pneumothorax. IMPRESSION: 1. Cardiomegaly with vascular congestion and moderate diffuse interstitial opacity suspect for pulmonary edema 2. Patchy focal airspace disease in the left mid lung and bilateral bases, possible superimposed pneumonia. Electronically Signed   By: Donavan Foil M.D.   On: 01/11/2018 18:28    ASSESSMENT / PLAN: Acute on chronic hypoxic respiratory  failure secondary to pulmonary edema which has improved after HD with UF of 2 liters. Right lower lobe Pneumonia  ESRD on HD  Hypokalemia  NSTEMI- recent cath, EF 55% Anemia without obvious acute blood loss  Hx: Stroke, COPD, and Asthma  P: Start Heparin drip, ASA and Plavix.    Cardiology consult Prn Bipap for dyspnea and/or hypoxia, wean Oxygen as tolerated  Prn bronchodilator therapy Trend troponin's Continuous telemetry monitoring  Nephrology plans to do HD again today Trend BMP Replace electrolytes as indicated  Monitor UOP Avoid nephrotoxic medications VTE px: heparin  Trend CBC  Monitor for s/sx of bleeding and transfuse for hgb <7  FAMILY:  Will update when available  Cammie Sickle, MD [place] Pager [phone] (please enter 7 digits)

## 2018-01-12 NOTE — Therapy (Signed)
Pt found on room air in no distress.  RA saturation of 94%.  BIPAP and HFNC are on standby in the room.

## 2018-01-12 NOTE — Progress Notes (Signed)
[facility]  ROUNDING NOTE   Subjective:   [name] admitted to [facility] on 01/11/2018 on End stage renal disease ([place]) [N18.6] Acute pulmonary edema ([place]) [J81.0]  Patient required emergent hemodialysis last night.   Patient on HFNC   Missed two treatment of hemodialysis. She states she was having diarrhea at home. Also endorses sick contact with grandson.   Objective:  Vital signs in last 24 hours:  Temp:  [97.7 F (36.5 C)-99.8 F (37.7 C)] 98.2 F (36.8 C) (04/26 0300) Pulse Rate:  [78-100] 86 (04/26 0600) Resp:  [15-29] 17 (04/26 0600) BP: (111-183)/(50-93) 141/73 (04/26 0600) SpO2:  [81 %-100 %] 95 % (04/26 0619) FiO2 (%):  [50 %] 50 % (04/26 0619) Weight:  [101.5 kg (223 lb 12.3 oz)-103.4 kg (228 lb)] 101.5 kg (223 lb 12.3 oz) (04/26 0228)  Weight change:  Filed Weights   01/11/18 2007 01/11/18 2150 01/12/18 0228  Weight: 102.2 kg (225 lb 5 oz) 102.6 kg (226 lb 3.1 oz) 101.5 kg (223 lb 12.3 oz)    Intake/Output: I/O last 3 completed shifts: In: 129 [P.O.:120; I.V.:9] Out: 2011 [Other:2011]   Intake/Output this shift:  No intake/output data recorded.  Physical Exam: General: Ill appearing  Head: Normocephalic, atraumatic. Moist oral mucosal membranes  Eyes: Anicteric,   Neck: Supple, trachea midline  Lungs:  Bilateral wheezing and crackles, HFNC  Heart: Regular rate and rhythm  Abdomen:  Soft, nontender, obese  Extremities: no peripheral edema.  Neurologic: Nonfocal, moving all four extremities  Skin: No lesions  Access: Left AVF    Basic Metabolic Panel: Recent Labs  Lab 01/11/18 1804  NA 135  K 5.9*  CL 106  CO2 14*  GLUCOSE 137*  BUN 98*  CREATININE 11.54*  CALCIUM 8.3*    Liver Function Tests: Recent Labs  Lab 01/11/18 1804  AST 25  ALT 11*  ALKPHOS 73  BILITOT 0.6  PROT 9.0*  ALBUMIN 3.9   No results for input(s): LIPASE, AMYLASE in the last 168 hours. No results for input(s): AMMONIA in the last 168  hours.  CBC: Recent Labs  Lab 01/11/18 1804 01/12/18 0836  WBC 12.5* 10.1  NEUTROABS 9.4*  --   HGB 8.4* 8.1*  HCT 26.0* 23.5*  MCV 96.8 92.9  PLT 240 203    Cardiac Enzymes: Recent Labs  Lab 01/11/18 1804 01/11/18 2212 01/12/18 0541  TROPONINI 0.16* 0.70* 3.46*    BNP: Invalid input(s): POCBNP  CBG: Recent Labs  Lab 01/11/18 2023  GLUCAP 105*    Microbiology: Results for orders placed or performed during the hospital encounter of 01/11/18  MRSA PCR Screening     Status: None   Collection Time: 01/11/18  8:33 PM  Result Value Ref Range Status   MRSA by PCR NEGATIVE NEGATIVE Final    Comment:        The GeneXpert MRSA Assay (FDA approved for NASAL specimens only), is one component of a comprehensive MRSA colonization surveillance program. It is not intended to diagnose MRSA infection nor to guide or monitor treatment for MRSA infections. Performed at [facility], [address] [place], [place] [address]     Coagulation Studies: No results for input(s): LABPROT, INR in the last 72 hours.  Urinalysis: No results for input(s): COLORURINE, LABSPEC, PHURINE, GLUCOSEU, HGBUR, BILIRUBINUR, KETONESUR, PROTEINUR, UROBILINOGEN, NITRITE, LEUKOCYTESUR in the last 72 hours.  Invalid input(s): APPERANCEUR    Imaging: Dg Chest Portable 1 View  Result Date: 01/11/2018 CLINICAL DATA:  Shortness of breath EXAM: PORTABLE CHEST 1 VIEW COMPARISON:  12/07/2017, 12/22/2017, 08/27/2016 FINDINGS: Cardiomegaly with vascular congestion. Moderate diffuse bilateral interstitial opacities suspicious for edema. Patchy focal airspace disease in the left mid lung and bilateral bases. Aortic atherosclerosis. No pneumothorax. IMPRESSION: 1. Cardiomegaly with vascular congestion and moderate diffuse interstitial opacity suspect for pulmonary edema 2. Patchy focal airspace disease in the left mid lung and bilateral bases, possible superimposed pneumonia. Electronically Signed    By: Donavan Foil M.D.   On: 01/11/2018 18:28     Medications:   . sodium chloride    . heparin     . aspirin EC  81 mg Oral Daily  . calcium carbonate  600 mg of elemental calcium Oral Q breakfast  . cinacalcet  30 mg Oral Daily  . clopidogrel  75 mg Oral Daily  . feeding supplement (NEPRO CARB STEADY)  237 mL Oral BID BM  . gabapentin  100 mg Oral QHS  . heparin  2,000 Units Intravenous Once  . nicotine  21 mg Transdermal Daily  . rosuvastatin  20 mg Oral Daily  . sevelamer carbonate  2.4 g Oral BID WC  . sodium chloride flush  3 mL Intravenous Q12H  . sodium polystyrene  30 g Oral Once  . venlafaxine XR  37.5 mg Oral QAC breakfast   sodium chloride, acetaminophen **OR** acetaminophen, ipratropium-albuterol, nitroGLYCERIN, ondansetron **OR** ondansetron (ZOFRAN) IV, senna-docusate, sodium chloride flush  Assessment/ Plan:  [name] is a 67 y.o. black female with end stage renal disease on hemodialysis, coronary artery disease status post CABG, hypertension, peripheral vascular disease, diabetes mellitus type 2, hyperlipidemia, bilateral total knee replacement, PD catheter removal June 30, 2017  [address]TTS/left AVF/ 103kg  1.  ESRD on hemodialysis: emergent hemodialysis last night for hyperkalemia, metabolic acidosis and volume overload.  Continues to have pulmonary edema and requiring HFNC - Schedule another dialysis treatment for today.   2. Acute respiratory failure on HFNC: acute exacerbation of diastolic congestive heart failure, acute COPD exacerbation with ongoing tobacco use and volume overload from missing two consecutive outpatient hemodialysis treatments.   3. Hypertension: blood pressure at goal. Currently not on any blood pressure agents.   4.  Anemia of chronic kidney disease: hemoglobin 8.1 - EPO with HD treatment  5.  Secondary hyperparathyroidism: with hyperphosphatemia:   - Continue cinacalcet - TUMS for binding   LOS:  1 Cicley Ganesh 4/26/20199:03 AM

## 2018-01-12 NOTE — Progress Notes (Signed)
Post HD assessment    01/12/18 0230  Neurological  Level of Consciousness Alert  Orientation Level Oriented X4  Respiratory  Respiratory Pattern Labored  Chest Assessment Chest expansion symmetrical  Cardiac  ECG Monitor Yes  Vascular  R Radial Pulse +1  L Radial Pulse +1  Integumentary  Integumentary (WDL) X  Skin Color Appropriate for ethnicity  Musculoskeletal  Musculoskeletal (WDL) X  Generalized Weakness Yes  Assistive Device None  GU Assessment  Genitourinary (WDL) X  Genitourinary Symptoms  (HD)  Psychosocial  Psychosocial (WDL) WDL

## 2018-01-12 NOTE — Progress Notes (Signed)
Initial Nutrition Assessment  DOCUMENTATION CODES:   Obesity unspecified  INTERVENTION:  Nepro Shake po BID, each supplement provides 425 kcal and 19 grams protein  Recommend Rena-Vite  NUTRITION DIAGNOSIS:   Inadequate oral intake related to chronic illness as evidenced by meal completion < 50%.  GOAL:   Patient will meet greater than or equal to 90% of their needs  MONITOR:   PO intake, Supplement acceptance, Labs, Weight trends  REASON FOR ASSESSMENT:   Malnutrition Screening Tool    ASSESSMENT:   Kimberly Shaffer  is a 67 y.o. female with a known history of coronary artery disease, COPD, diabetes mellitus type 2, hypertension, hyperlipidemia, end-stage renal disease on dialysis presented to the emergency room with increased shortness of breath.  Patient missed dialysis on Tuesday and Thursday, now has acute pulmonary edema, hyperkalemia  Spoke with Kimberly Shaffer at bedside, she reports that she hadn't eaten all week because she didn't feel well, likely related to her missing dialysis. She states normally she eats a bacon and egg sandwich from a restaurant before dialysis, will eat grapes or raisins following dialysis, and porkchops and creamed potatoes for dinner.  States she drinks a "mocha" at dialysis, unsure what she is referring to. Offered Nepro which she said she really likes. Ordered for her.  She mentioned to me that she does not like to take her binders, encouraged  Her to take these for her phosphorus labs. After this she became flat and said she wanted to go to sleep.  UBW 228 pounds, per chart she is down to 223 pounds, likely related to dehydration with poor oral intake all week.  Unable to diagnose malnutrition at this time.  Labs reviewed: K+ 3.2 CBGs 105  Medications reviewed and include:  Sensipar, Calcium carbonate, EPO Heparin gtt  NUTRITION - FOCUSED PHYSICAL EXAM:    Most Recent Value  Orbital Region  No depletion  Upper Arm Region  No  depletion  Thoracic and Lumbar Region  No depletion  Buccal Region  No depletion  Temple Region  No depletion  Clavicle Bone Region  No depletion  Clavicle and Acromion Bone Region  No depletion  Scapular Bone Region  No depletion  Dorsal Hand  No depletion  Patellar Region  No depletion  Anterior Thigh Region  No depletion  Posterior Calf Region  No depletion  Edema (RD Assessment)  Mild       Diet Order:  Diet renal/carb modified with fluid restriction Diet-HS Snack? Nothing; Fluid restriction: 1200 mL Fluid; Room service appropriate? Yes; Fluid consistency: Thin  EDUCATION NEEDS:   No education needs have been identified at this time  Skin:  Skin Assessment: Reviewed RN Assessment  Last BM:  PTA  Height:   Ht Readings from Last 1 Encounters:  01/11/18 5\' 6"  (1.676 m)    Weight:   Wt Readings from Last 1 Encounters:  01/12/18 223 lb 12.3 oz (101.5 kg)    Ideal Body Weight:  59.1 kg  BMI:  Body mass index is 36.12 kg/m.  Estimated Nutritional Needs:   Kcal:  1910-2100  Protein:  101-122 grams (1-1.2g/kg)  Fluid:  UOP +1L  Satira Anis. Maze Corniel, MS, RD LDN Inpatient Clinical Dietitian Pager [phone]

## 2018-01-12 NOTE — Progress Notes (Signed)
[facility] for Heparin Indication: chest pain/ACS  Allergies  Allergen Reactions  . Nystatin Hives and Itching  . Sulfa Antibiotics Swelling, Hives and Rash  . Niaspan [Niacin] Other (See Comments) and Hives    Patient Measurements: Height: 5\' 6"  (167.6 cm) Weight: 223 lb 12.3 oz (101.5 kg) IBW/kg (Calculated) : 59.3 Heparin Dosing Weight: 82 kg  Vital Signs: Temp: 99.1 F (37.3 C) (04/26 1805) Temp Source: Oral (04/26 1805) BP: 128/62 (04/26 1945) Pulse Rate: 89 (04/26 1945)  Labs: Recent Labs    01/11/18 1804 01/11/18 2212 01/12/18 0541 01/12/18 0836 01/12/18 0950 01/12/18 1800  HGB 8.4*  --   --  8.1* 7.6*  --   HCT 26.0*  --   --  23.5* 22.7*  --   PLT 240  --   --  203 197  --   APTT  --   --   --   --  41*  --   LABPROT  --   --   --   --  14.6  --   INR  --   --   --   --  1.15  --   HEPARINUNFRC  --   --   --   --   --  0.12*  CREATININE 11.54*  --   --  5.93*  --   --   TROPONINI 0.16* 0.70* 3.46* 4.16*  --   --     Estimated Creatinine Clearance: 11.1 mL/min (A) (by C-G formula based on SCr of 5.93 mg/dL (H)).   Medical History: Past Medical History:  Diagnosis Date  . Asthma   . CAD (coronary artery disease)   . Chronic lower back pain   . COPD (chronic obstructive pulmonary disease) ([place])   . Depression   . Diabetes mellitus without complication (HCC)    NIDD  . H/O angina pectoris   . H/O blood clots   . Hypercholesteremia   . Hypertension   . Left rotator cuff tear   . Myocardial infarction ([place])   . Obesity   . Renal insufficiency   . Stroke ([place])   . Vertigo, aural     Assessment: 67 y/o F with a h/o ESRD on HD admitted with respiratory failure and NSTEMI.   Heparin drip paused about 45 minutes and resumed with level 4 hours later.   Goal of Therapy:  Heparin level 0.3-0.7 units/ml Monitor platelets by anticoagulation protocol: Yes   Plan:  Will bolus heparin 2500 units and increase  conservatively to 1300 units/hr as heparin drip was paused prior to level. Next level in 8 hours.   Ulice Dash D 01/12/2018,8:03 PM

## 2018-01-13 ENCOUNTER — Other Ambulatory Visit: Payer: Self-pay

## 2018-01-13 ENCOUNTER — Inpatient Hospital Stay: Payer: Medicare HMO

## 2018-01-13 DIAGNOSIS — N186 End stage renal disease: Secondary | ICD-10-CM

## 2018-01-13 LAB — CBC
HEMATOCRIT: 25.7 % — AB (ref 35.0–47.0)
HEMOGLOBIN: 8.6 g/dL — AB (ref 12.0–16.0)
MCH: 31.5 pg (ref 26.0–34.0)
MCHC: 33.6 g/dL (ref 32.0–36.0)
MCV: 93.7 fL (ref 80.0–100.0)
Platelets: 219 10*3/uL (ref 150–440)
RBC: 2.75 MIL/uL — AB (ref 3.80–5.20)
RDW: 15.4 % — ABNORMAL HIGH (ref 11.5–14.5)
WBC: 9.2 10*3/uL (ref 3.6–11.0)

## 2018-01-13 LAB — RENAL FUNCTION PANEL
ALBUMIN: 3.6 g/dL (ref 3.5–5.0)
Anion gap: 13 (ref 5–15)
BUN: 21 mg/dL — ABNORMAL HIGH (ref 6–20)
CHLORIDE: 96 mmol/L — AB (ref 101–111)
CO2: 28 mmol/L (ref 22–32)
Calcium: 8.7 mg/dL — ABNORMAL LOW (ref 8.9–10.3)
Creatinine, Ser: 4.26 mg/dL — ABNORMAL HIGH (ref 0.44–1.00)
GFR calc Af Amer: 11 mL/min — ABNORMAL LOW (ref >60)
GFR calc non Af Amer: 10 mL/min — ABNORMAL LOW (ref >60)
GLUCOSE: 66 mg/dL (ref 65–99)
PHOSPHORUS: 2.3 mg/dL — AB (ref 2.5–4.6)
POTASSIUM: 3.5 mmol/L (ref 3.5–5.1)
Sodium: 137 mmol/L (ref 135–145)

## 2018-01-13 LAB — HEPARIN LEVEL (UNFRACTIONATED)
HEPARIN UNFRACTIONATED: 0.25 [IU]/mL — AB (ref 0.30–0.70)
HEPARIN UNFRACTIONATED: 0.33 [IU]/mL (ref 0.30–0.70)
Heparin Unfractionated: 0.25 IU/mL — ABNORMAL LOW (ref 0.30–0.70)

## 2018-01-13 LAB — TROPONIN I: Troponin I: 2.68 ng/mL (ref <0.03)

## 2018-01-13 LAB — GLUCOSE, CAPILLARY: GLUCOSE-CAPILLARY: 113 mg/dL — AB (ref 65–99)

## 2018-01-13 LAB — HEPATITIS B CORE ANTIBODY, TOTAL: Hep B Core Total Ab: NEGATIVE

## 2018-01-13 LAB — HEPATITIS B SURFACE ANTIGEN: HEP B S AG: NEGATIVE

## 2018-01-13 LAB — MAGNESIUM: Magnesium: 1.5 mg/dL — ABNORMAL LOW (ref 1.7–2.4)

## 2018-01-13 LAB — HEPATITIS B SURFACE ANTIBODY,QUALITATIVE: HEP B S AB: REACTIVE

## 2018-01-13 MED ORDER — HEPARIN BOLUS VIA INFUSION
1250.0000 [IU] | Freq: Once | INTRAVENOUS | Status: AC
  Administered 2018-01-13: 1250 [IU] via INTRAVENOUS
  Filled 2018-01-13: qty 1250

## 2018-01-13 MED ORDER — NICOTINE 7 MG/24HR TD PT24: 7.0000 mg | MEDICATED_PATCH | Freq: Every day | TRANSDERMAL | Status: DC

## 2018-01-13 MED ORDER — NICOTINE 7 MG/24HR TD PT24
7.0000 mg | MEDICATED_PATCH | Freq: Every day | TRANSDERMAL | Status: DC
  Administered 2018-01-13 – 2018-01-14 (×2): 7 mg via TRANSDERMAL
  Filled 2018-01-13 (×2): qty 1

## 2018-01-13 MED ORDER — HYDROCODONE-ACETAMINOPHEN 5-325 MG PO TABS: 1.0000 | ORAL_TABLET | ORAL | Status: DC | PRN

## 2018-01-13 MED ORDER — MAGNESIUM OXIDE 400 (241.3 MG) MG PO TABS
400.0000 mg | ORAL_TABLET | Freq: Once | ORAL | Status: AC
  Administered 2018-01-13: 400 mg via ORAL
  Filled 2018-01-13: qty 1

## 2018-01-13 MED ORDER — SENNOSIDES-DOCUSATE SODIUM 8.6-50 MG PO TABS
1.0000 | ORAL_TABLET | Freq: Every day | ORAL | Status: DC
  Administered 2018-01-13: 1 via ORAL
  Filled 2018-01-13: qty 1

## 2018-01-13 NOTE — Progress Notes (Signed)
[facility] for Heparin Indication: chest pain/ACS  Allergies  Allergen Reactions  . Nystatin Hives and Itching  . Sulfa Antibiotics Swelling, Hives and Rash  . Niaspan [Niacin] Other (See Comments) and Hives    Patient Measurements: Height: 5\' 6"  (167.6 cm) Weight: 223 lb 12.3 oz (101.5 kg) IBW/kg (Calculated) : 59.3 Heparin Dosing Weight: 82 kg  Vital Signs: Temp: 99.9 F (37.7 C) (04/27 0200) Temp Source: Oral (04/27 0200) BP: 145/70 (04/27 0500) Pulse Rate: 97 (04/27 0500)  Labs: Recent Labs    01/11/18 1804  01/12/18 0541 01/12/18 0836 01/12/18 0950 01/12/18 1800 01/13/18 0413  HGB 8.4*  --   --  8.1* 7.6*  --  8.6*  HCT 26.0*  --   --  23.5* 22.7*  --  25.7*  PLT 240  --   --  203 197  --  219  APTT  --   --   --   --  41*  --   --   LABPROT  --   --   --   --  14.6  --   --   INR  --   --   --   --  1.15  --   --   HEPARINUNFRC  --   --   --   --   --  0.12* 0.25*  CREATININE 11.54*  --   --  5.93*  --   --  4.26*  TROPONINI 0.16*   < > 3.46* 4.16*  --   --  2.68*   < > = values in this interval not displayed.    Estimated Creatinine Clearance: 15.4 mL/min (A) (by C-G formula based on SCr of 4.26 mg/dL (H)).   Medical History: Past Medical History:  Diagnosis Date  . Asthma   . CAD (coronary artery disease)   . Chronic lower back pain   . COPD (chronic obstructive pulmonary disease) ([place])   . Depression   . Diabetes mellitus without complication (HCC)    NIDD  . H/O angina pectoris   . H/O blood clots   . Hypercholesteremia   . Hypertension   . Left rotator cuff tear   . Myocardial infarction ([place])   . Obesity   . Renal insufficiency   . Stroke ([place])   . Vertigo, aural     Assessment: 67 y/o F with a h/o ESRD on HD admitted with respiratory failure and NSTEMI.   Heparin drip paused about 45 minutes and resumed with level 4 hours later.   Goal of Therapy:  Heparin level 0.3-0.7 units/ml Monitor  platelets by anticoagulation protocol: Yes   Plan:  Will bolus heparin 2500 units and increase conservatively to 1300 units/hr as heparin drip was paused prior to level. Next level in 8 hours.   04/27 @ 0400 HL 0.25 subtherapeutic. Will rebolus w/ heparin 1250 units IV x 1 and increase rate to 1450 units/hr and will recheck @ 1000. CBC stable.  Tobie Lords, PharmD, BCPS Clinical Pharmacist 01/13/2018

## 2018-01-13 NOTE — Progress Notes (Signed)
Patient transferred to room 255 by wheelchair with this RN.  Alert sitting up in chair in new room.  Junious Dresser, RN at bedside.

## 2018-01-13 NOTE — Progress Notes (Signed)
Pt refused chair alarm and was educated on the importance of of staying safe and without falls. Pt encouraged to use call bell when they need assistance ambulating.

## 2018-01-13 NOTE — Progress Notes (Signed)
RN spoke with Dr. Juleen China on the phone and asked MD about patient getting epogen on non-dialysis day.  MD gave order to not give epogen today.

## 2018-01-13 NOTE — Progress Notes (Signed)
Patient requested for Nicotine patch. Dr. Duane Boston notified and received a new order for it . Will continue to monitor.

## 2018-01-13 NOTE — Plan of Care (Signed)
Patient alert and oriented.  Dialysis treatment done during early evening, tolerated well.  No acute distress noted. Patient requested and given Nephro.  Patient OOB to toilet with assist of 1. Slight epitaxis during shift x 2.  Resolved immediately. No complaints of pain or discomfort at this time.  Will continue to monitor.

## 2018-01-13 NOTE — Progress Notes (Signed)
[facility] NAME: Kimberly Shaffer    MR#:  [id number]  DATE OF BIRTH:  [birth year]  SUBJECTIVE:   Doing well.now ff high flow nasal cannula oxygen. Denies any chest pain REVIEW OF SYSTEMS:   Review of Systems  Constitutional: Negative for chills, fever and weight loss.  HENT: Negative for ear discharge, ear pain and nosebleeds.   Eyes: Negative for blurred vision, pain and discharge.  Respiratory: Positive for shortness of breath. Negative for sputum production, wheezing and stridor.   Cardiovascular: Negative for chest pain, palpitations, orthopnea and PND.  Gastrointestinal: Negative for abdominal pain, diarrhea, nausea and vomiting.  Genitourinary: Negative for frequency and urgency.  Musculoskeletal: Negative for back pain and joint pain.  Neurological: Negative for sensory change, speech change, focal weakness and weakness.  Psychiatric/Behavioral: Negative for depression and hallucinations. The patient is not nervous/anxious.    Tolerating Diet:yes Tolerating PT: pending  DRUG ALLERGIES:   Allergies  Allergen Reactions  . Nystatin Hives and Itching  . Sulfa Antibiotics Swelling, Hives and Rash  . Niaspan [Niacin] Other (See Comments) and Hives    VITALS:  Blood pressure (!) 141/76, pulse 97, temperature 98.6 F (37 C), temperature source Oral, resp. rate 18, height 5\' 6"  (1.676 m), weight 100.9 kg (222 lb 6.4 oz), SpO2 94 %.  PHYSICAL EXAMINATION:   Physical Exam  GENERAL:  68 y.o.-year-old patient lying in the bed with no acute distress.  EYES: Pupils equal, round, reactive to light and accommodation. No scleral icterus. Extraocular muscles intact.  HEENT: Head atraumatic, normocephalic. Oropharynx and nasopharynx clear.  NECK:  Supple, no jugular venous distention. No thyroid enlargement, no tenderness.  LUNGS: distant breath sounds bilaterally, no wheezing, rales, rhonchi. No use of accessory muscles of  respiration.  CARDIOVASCULAR: S1, S2 normal. No murmurs, rubs, or gallops.  ABDOMEN: Soft, nontender, nondistended. Bowel sounds present. No organomegaly or mass.  EXTREMITIES: No cyanosis, clubbing or edema b/l.    NEUROLOGIC: Cranial nerves II through XII are intact. No focal Motor or sensory deficits b/l.   PSYCHIATRIC:  patient is alert and oriented x 3.  SKIN: No obvious rash, lesion, or ulcer.   LABORATORY PANEL:  CBC Recent Labs  Lab 01/13/18 0413  WBC 9.2  HGB 8.6*  HCT 25.7*  PLT 219    Chemistries  Recent Labs  Lab 01/11/18 1804  01/13/18 0413  NA 135   < > 137  K 5.9*   < > 3.5  CL 106   < > 96*  CO2 14*   < > 28  GLUCOSE 137*   < > 66  BUN 98*   < > 21*  CREATININE 11.54*   < > 4.26*  CALCIUM 8.3*   < > 8.7*  MG  --   --  1.5*  AST 25  --   --   ALT 11*  --   --   ALKPHOS 73  --   --   BILITOT 0.6  --   --    < > = values in this interval not displayed.   Cardiac Enzymes Recent Labs  Lab 01/13/18 0413  TROPONINI 2.68*   RADIOLOGY:  Dg Chest Port 1 View  Result Date: 01/12/2018 CLINICAL DATA:  Patient admitted with shortness of breath 01/11/2018. EXAM: PORTABLE CHEST 1 VIEW COMPARISON:  Single-view of the chest 01/11/2018 and 12/22/2017. FINDINGS: Marked cardiomegaly is again seen. Bilateral airspace disease is improved. No pneumothorax or pleural effusion.  Lungs appear emphysematous. IMPRESSION: Improved bilateral airspace disease compatible with resolving edema and/or pneumonia. Cardiomegaly. Emphysema. Electronically Signed   By: Inge Rise M.D.   On: 01/12/2018 09:23   Dg Chest Portable 1 View  Result Date: 01/11/2018 CLINICAL DATA:  Shortness of breath EXAM: PORTABLE CHEST 1 VIEW COMPARISON:  12/07/2017, 12/22/2017, 08/27/2016 FINDINGS: Cardiomegaly with vascular congestion. Moderate diffuse bilateral interstitial opacities suspicious for edema. Patchy focal airspace disease in the left mid lung and bilateral bases. Aortic atherosclerosis. No  pneumothorax. IMPRESSION: 1. Cardiomegaly with vascular congestion and moderate diffuse interstitial opacity suspect for pulmonary edema 2. Patchy focal airspace disease in the left mid lung and bilateral bases, possible superimposed pneumonia. Electronically Signed   By: Donavan Foil M.D.   On: 01/11/2018 18:28   Dg Hip Unilat With Pelvis 2-3 Views Right  Result Date: 01/13/2018 CLINICAL DATA:  Right hip pain after fall 1 day ago. EXAM: DG HIP (WITH OR WITHOUT PELVIS) 2-3V RIGHT COMPARISON:  Right hip x-rays dated July 05, 2016. FINDINGS: No acute fracture or dislocation. Moderate right and mild left hip osteoarthritis, similar to prior study. Degenerative changes of the pubic symphysis and sacroiliac joints, which remain intact. Osteopenia. Unchanged right iliac stent and pelvic phleboliths. IMPRESSION: 1.  No acute osseous abnormality. 2. Unchanged moderate right and mild left hip osteoarthritis. Electronically Signed   By: Titus Dubin M.D.   On: 01/13/2018 10:13   ASSESSMENT AND PLAN:   Kimberly Shaffer  is a 67 y.o. female with a known history of coronary artery disease, COPD, diabetes mellitus type 2, hypertension, hyperlipidemia, end-stage renal disease on dialysis presented to the emergency room with increased shortness of breath.  Patient missed dialysis on Tuesday and Thursday  *Acute hypoxic respiratory failure  Continue BiPAP for respiratory failure-- patient now on high flow nasal cannula oxygen --- now on room air   *Acute pulmonary edema and fluid overload Secondary to missing dialysis--- patient go to urgent hemodialysis yesterday and today with ultrafiltration. Her stats are improving. Nephrology consult appreciated.  *Elevated troponin and absence of chest pain or new EKG changes. Patient was seen by Dr. Saralyn Pilar. Recommends continue heparin for 48 to 72 hours. - Patient recently had all cardiac catheterization that revealed chronically occluded [name] to LAD and chronically  occluded RCA with patent stands to LAD and left circumflex therefore cardiology recommend defer  cardiac catheterization  *Hyperkalemia received Oral Kayexalate   *end-stage renal disease Continue dialysis on her schedule  *Anemia of chronic disease Monitor hemoglobin hematocrit  *DVT prophylaxis with subcu heparin   transfer to medical floor. Physical therapy to see patient. Discharged to home tomorrow if continues to improve. Patient agreement with plan.   Case discussed with Care Management/Social Worker. Management plans discussed with the patient, family and they are in agreement.  CODE STATUS: FULL  DVT Prophylaxis: Heparin  TOTAL TIME TAKING CARE OF THIS PATIENT: 35* minutes.  >50% time spent on counselling and coordination of care  POSSIBLE D/C IN few DAYS, DEPENDING ON CLINICAL CONDITION.  Note: This dictation was prepared with Dragon dictation along with smaller phrase technology. Any transcriptional errors that result from this process are unintentional.  Fritzi Mandes M.D on 01/13/2018 at 4:14 PM  Between 7am to 6pm - Pager - [phone]  After 6pm go to [email or web address] - password [email or web address] [facility]  Office  [phone]  CC: Primary care physician; Shari Prows, [facility] CarePatient ID: Kimberly Shaffer, female   DOB: Jul 13, 1951, 67 y.o.  MRN: [id number]

## 2018-01-13 NOTE — Progress Notes (Signed)
[facility]  ROUNDING NOTE   Subjective:   Hemodialysis yesterday evening. UF of 2 liters. Now off oxygen. Breathing nonlabored.   Patient does have nausea this morning.   Objective:  Vital signs in last 24 hours:  Temp:  [98.4 F (36.9 C)-99.9 F (37.7 C)] 98.8 F (37.1 C) (04/27 0744) Pulse Rate:  [73-110] 98 (04/27 0800) Resp:  [13-25] 19 (04/27 0800) BP: (85-156)/(58-78) 151/73 (04/27 0800) SpO2:  [90 %-100 %] 96 % (04/27 0800)  Weight change:  Filed Weights   01/11/18 2007 01/11/18 2150 01/12/18 0228  Weight: 102.2 kg (225 lb 5 oz) 102.6 kg (226 lb 3.1 oz) 101.5 kg (223 lb 12.3 oz)    Intake/Output: I/O last 3 completed shifts: In: 829.7 [P.O.:594; I.V.:235.7] Out: 4011 [Other:4011]   Intake/Output this shift:  Total I/O In: 29 [I.V.:29] Out: -   Physical Exam: General: Sitting up in bed  Head: Normocephalic, atraumatic. Moist oral mucosal membranes  Eyes: Anicteric,   Neck: Supple, trachea midline  Lungs:  Bilateral wheezes  Heart: Regular rate and rhythm  Abdomen:  Soft, nontender, obese  Extremities: no peripheral edema.  Neurologic: Nonfocal, moving all four extremities  Skin: No lesions  Access: Left AVF    Basic Metabolic Panel: Recent Labs  Lab 01/11/18 1804 01/12/18 0836 01/12/18 1800 01/13/18 0413  NA 135 136  --  137  K 5.9* 3.2* 3.2* 3.5  CL 106 95*  --  96*  CO2 14* 28  --  28  GLUCOSE 137* 58*  --  66  BUN 98* 35*  --  21*  CREATININE 11.54* 5.93*  --  4.26*  CALCIUM 8.3* 8.5*  --  8.7*  MG  --   --   --  1.5*  PHOS  --   --   --  2.3*    Liver Function Tests: Recent Labs  Lab 01/11/18 1804 01/13/18 0413  AST 25  --   ALT 11*  --   ALKPHOS 73  --   BILITOT 0.6  --   PROT 9.0*  --   ALBUMIN 3.9 3.6   No results for input(s): LIPASE, AMYLASE in the last 168 hours. No results for input(s): AMMONIA in the last 168 hours.  CBC: Recent Labs  Lab 01/11/18 1804 01/12/18 0836 01/12/18 0950 01/13/18 0413   WBC 12.5* 10.1 10.3 9.2  NEUTROABS 9.4*  --   --   --   HGB 8.4* 8.1* 7.6* 8.6*  HCT 26.0* 23.5* 22.7* 25.7*  MCV 96.8 92.9 92.6 93.7  PLT 240 203 197 219    Cardiac Enzymes: Recent Labs  Lab 01/11/18 1804 01/11/18 2212 01/12/18 0541 01/12/18 0836 01/13/18 0413  TROPONINI 0.16* 0.70* 3.46* 4.16* 2.68*    BNP: Invalid input(s): POCBNP  CBG: Recent Labs  Lab 01/11/18 2023  GLUCAP 105*    Microbiology: Results for orders placed or performed during the hospital encounter of 01/11/18  MRSA PCR Screening     Status: None   Collection Time: 01/11/18  8:33 PM  Result Value Ref Range Status   MRSA by PCR NEGATIVE NEGATIVE Final    Comment:        The GeneXpert MRSA Assay (FDA approved for NASAL specimens only), is one component of a comprehensive MRSA colonization surveillance program. It is not intended to diagnose MRSA infection nor to guide or monitor treatment for MRSA infections. Performed at [facility], [address] [place], [place] [address]     Coagulation Studies:  Recent Labs    01/12/18 0950  LABPROT 14.6  INR 1.15    Urinalysis: No results for input(s): COLORURINE, LABSPEC, PHURINE, GLUCOSEU, HGBUR, BILIRUBINUR, KETONESUR, PROTEINUR, UROBILINOGEN, NITRITE, LEUKOCYTESUR in the last 72 hours.  Invalid input(s): APPERANCEUR    Imaging: Dg Chest Port 1 View  Result Date: 01/12/2018 CLINICAL DATA:  Patient admitted with shortness of breath 01/11/2018. EXAM: PORTABLE CHEST 1 VIEW COMPARISON:  Single-view of the chest 01/11/2018 and 12/22/2017. FINDINGS: Marked cardiomegaly is again seen. Bilateral airspace disease is improved. No pneumothorax or pleural effusion. Lungs appear emphysematous. IMPRESSION: Improved bilateral airspace disease compatible with resolving edema and/or pneumonia. Cardiomegaly. Emphysema. Electronically Signed   By: Inge Rise M.D.   On: 01/12/2018 09:23   Dg Chest Portable 1 View  Result Date:  01/11/2018 CLINICAL DATA:  Shortness of breath EXAM: PORTABLE CHEST 1 VIEW COMPARISON:  12/07/2017, 12/22/2017, 08/27/2016 FINDINGS: Cardiomegaly with vascular congestion. Moderate diffuse bilateral interstitial opacities suspicious for edema. Patchy focal airspace disease in the left mid lung and bilateral bases. Aortic atherosclerosis. No pneumothorax. IMPRESSION: 1. Cardiomegaly with vascular congestion and moderate diffuse interstitial opacity suspect for pulmonary edema 2. Patchy focal airspace disease in the left mid lung and bilateral bases, possible superimposed pneumonia. Electronically Signed   By: Donavan Foil M.D.   On: 01/11/2018 18:28     Medications:   . sodium chloride Stopped (01/12/18 1331)  . ceFEPime (MAXIPIME) IV    . heparin 1,450 Units/hr (01/13/18 0600)   . aspirin EC  81 mg Oral Daily  . calcium carbonate  600 mg of elemental calcium Oral Q breakfast  . cinacalcet  30 mg Oral Daily  . clopidogrel  75 mg Oral Daily  . epoetin (EPOGEN/PROCRIT) injection  10,000 Units Intravenous Q T,Th,Sa-HD  . feeding supplement (NEPRO CARB STEADY)  237 mL Oral BID BM  . gabapentin  100 mg Oral QHS  . rosuvastatin  20 mg Oral Daily  . sevelamer carbonate  2.4 g Oral BID WC  . sodium chloride flush  3 mL Intravenous Q12H  . sodium polystyrene  30 g Oral Once  . venlafaxine XR  37.5 mg Oral QAC breakfast   sodium chloride, acetaminophen **OR** acetaminophen, HYDROcodone-acetaminophen, ipratropium-albuterol, nitroGLYCERIN, ondansetron **OR** ondansetron (ZOFRAN) IV, senna-docusate, sodium chloride flush  Assessment/ Plan:  [name] is a 67 y.o. black female with end stage renal disease on hemodialysis, coronary artery disease status post CABG, hypertension, peripheral vascular disease, diabetes mellitus type 2, hyperlipidemia, bilateral total knee replacement, PD catheter removal June 30, 2017  [address]TTS/left AVF/ 103kg  1.  ESRD on hemodialysis:  emergent hemodialysis on admission hyperkalemia, metabolic acidosis and volume overload. Extra hemodialysis treatment yesterday due to pulmonary edema.  No acute indication for dialysis today. Will monitor daily for dialysis. Next scheduled dialysis for Tuesday.   2. Acute respiratory failure on HFNC: acute exacerbation of diastolic congestive heart failure, acute COPD exacerbation with ongoing tobacco use and volume overload from missing two consecutive outpatient hemodialysis treatments.   3. Hypertension: blood pressure at goal. Currently not on any blood pressure agents.   4.  Anemia of chronic kidney disease: hemoglobin 8.6 - EPO with HD treatment  5.  Secondary hyperparathyroidism: with hyperphosphatemia:   - Continue cinacalcet - TUMS and sevelamer for binding   LOS: 2 Azari Hasler 4/27/20198:46 AM

## 2018-01-13 NOTE — Progress Notes (Signed)
[facility] Cardiology  SUBJECTIVE: Patient laying in bed, without supplemental O2, reports doing much better, denies chest pain or shortness of breath   Vitals:   01/13/18 0800 01/13/18 0900 01/13/18 1000 01/13/18 1100  BP: (!) 151/73 (!) 146/62 (!) 147/70 (!) 142/73  Pulse: 98 96 98 95  Resp: 19 19 12  (!) 24  Temp:      TempSrc:      SpO2: 96% 92% 92% 93%  Weight:      Height:         Intake/Output Summary (Last 24 hours) at 01/13/2018 1111 Last data filed at 01/13/2018 1021 Gross per 24 hour  Intake 998.72 ml  Output 2000 ml  Net -1001.28 ml      PHYSICAL EXAM  General: Well developed, well nourished, in no acute distress HEENT:  Normocephalic and atramatic Neck:  No JVD.  Lungs: Clear bilaterally to auscultation and percussion. Heart: HRRR . Normal S1 and S2 without gallops or murmurs.  Abdomen: Bowel sounds are positive, abdomen soft and non-tender  Msk:  Back normal, normal gait. Normal strength and tone for age. Extremities: No clubbing, cyanosis or edema.   Neuro: Alert and oriented X 3. Psych:  Good affect, responds appropriately   LABS: Basic Metabolic Panel: Recent Labs    01/12/18 0836 01/12/18 1800 01/13/18 0413  NA 136  --  137  K 3.2* 3.2* 3.5  CL 95*  --  96*  CO2 28  --  28  GLUCOSE 58*  --  66  BUN 35*  --  21*  CREATININE 5.93*  --  4.26*  CALCIUM 8.5*  --  8.7*  MG  --   --  1.5*  PHOS  --   --  2.3*   Liver Function Tests: Recent Labs    01/11/18 1804 01/13/18 0413  AST 25  --   ALT 11*  --   ALKPHOS 73  --   BILITOT 0.6  --   PROT 9.0*  --   ALBUMIN 3.9 3.6   No results for input(s): LIPASE, AMYLASE in the last 72 hours. CBC: Recent Labs    01/11/18 1804  01/12/18 0950 01/13/18 0413  WBC 12.5*   < > 10.3 9.2  NEUTROABS 9.4*  --   --   --   HGB 8.4*   < > 7.6* 8.6*  HCT 26.0*   < > 22.7* 25.7*  MCV 96.8   < > 92.6 93.7  PLT 240   < > 197 219   < > = values in this interval not displayed.   Cardiac Enzymes: Recent Labs     01/12/18 0541 01/12/18 0836 01/13/18 0413  TROPONINI 3.46* 4.16* 2.68*   BNP: Invalid input(s): POCBNP D-Dimer: No results for input(s): DDIMER in the last 72 hours. Hemoglobin A1C: No results for input(s): HGBA1C in the last 72 hours. Fasting Lipid Panel: No results for input(s): CHOL, HDL, LDLCALC, TRIG, CHOLHDL, LDLDIRECT in the last 72 hours. Thyroid Function Tests: No results for input(s): TSH, T4TOTAL, T3FREE, THYROIDAB in the last 72 hours.  Invalid input(s): FREET3 Anemia Panel: No results for input(s): VITAMINB12, FOLATE, FERRITIN, TIBC, IRON, RETICCTPCT in the last 72 hours.  Dg Chest Port 1 View  Result Date: 01/12/2018 CLINICAL DATA:  Patient admitted with shortness of breath 01/11/2018. EXAM: PORTABLE CHEST 1 VIEW COMPARISON:  Single-view of the chest 01/11/2018 and 12/22/2017. FINDINGS: Marked cardiomegaly is again seen. Bilateral airspace disease is improved. No pneumothorax or pleural effusion. Lungs appear emphysematous. IMPRESSION:  Improved bilateral airspace disease compatible with resolving edema and/or pneumonia. Cardiomegaly. Emphysema. Electronically Signed   By: Inge Rise M.D.   On: 01/12/2018 09:23   Dg Chest Portable 1 View  Result Date: 01/11/2018 CLINICAL DATA:  Shortness of breath EXAM: PORTABLE CHEST 1 VIEW COMPARISON:  12/07/2017, 12/22/2017, 08/27/2016 FINDINGS: Cardiomegaly with vascular congestion. Moderate diffuse bilateral interstitial opacities suspicious for edema. Patchy focal airspace disease in the left mid lung and bilateral bases. Aortic atherosclerosis. No pneumothorax. IMPRESSION: 1. Cardiomegaly with vascular congestion and moderate diffuse interstitial opacity suspect for pulmonary edema 2. Patchy focal airspace disease in the left mid lung and bilateral bases, possible superimposed pneumonia. Electronically Signed   By: Donavan Foil M.D.   On: 01/11/2018 18:28   Dg Hip Unilat With Pelvis 2-3 Views Right  Result Date:  01/13/2018 CLINICAL DATA:  Right hip pain after fall 1 day ago. EXAM: DG HIP (WITH OR WITHOUT PELVIS) 2-3V RIGHT COMPARISON:  Right hip x-rays dated July 05, 2016. FINDINGS: No acute fracture or dislocation. Moderate right and mild left hip osteoarthritis, similar to prior study. Degenerative changes of the pubic symphysis and sacroiliac joints, which remain intact. Osteopenia. Unchanged right iliac stent and pelvic phleboliths. IMPRESSION: 1.  No acute osseous abnormality. 2. Unchanged moderate right and mild left hip osteoarthritis. Electronically Signed   By: Titus Dubin M.D.   On: 01/13/2018 10:13     Echo LVEF 50 to 55%  TELEMETRY: Normal sinus rhythm:  ASSESSMENT AND PLAN:  Active Problems:   Respiratory failure ([place])    1.  Elevated troponin, most consistent with non-ST elevation myocardial infarction, in the absence of chest pain or new ECG changes, and patient who recently underwent cardiac catheterization which revealed chronically occluded LIMA to LAD, chronically occluded RCA, patent stents left main to LAD and left circumflex in the setting of pulmonary edema/fluid overload after missing dialysis 2 days 2.  Pulmonary edema/respiratory failure, secondary to noncompliance with hemodialysis, improved after hemodialysis 3.  Chronic anemia  Recommendations  1.  Agree with current therapy 2.  Continue heparin drip, DC on 09/15/2018 3.  Continue Plavix 75 mg daily 4.  Defer repeat cardiac catheterization   Isaias Cowman, MD, PhD, [facility] 01/13/2018 11:11 AM

## 2018-01-13 NOTE — Progress Notes (Signed)
Name: Kimberly Shaffer MRN: [id number] DOB: 1951-05-13    ADMISSION DATE:  01/11/2018 CONSULTATION DATE: 01/11/2018  REFERRING MD : Dr. Estanislado Pandy  CHIEF COMPLAINT: Shortness of Breath   BRIEF PATIENT DESCRIPTION:  67 yo female admitted with acute on chronic hypoxic respiratory failure secondary to pulmonary edema after missing 2 HD sessions requiring emergent HD and Bipap   SIGNIFICANT EVENTS  04/25-Pt admitted to stepdown unit   STUDIES:  None   HISTORY OF PRESENT ILLNESS:   This is a 67 yo female with a PMH of Stroke, ESRD on HD, Obesity, MI, HTN, Hypercholesteremia, Diabetes Mellitus, Depression, COPD, Chronic Lower Back Pain, CAD, and Asthma.  She presented to [facility] ER on 04/25 with shortness of breath unrelieved despite albuterol treatment at home.  In the ER due to respiratory failure she was placed on Bipap.  She missed her last 2 hemodialysis sessions. CXR revealing pulmonary edema, therefore nephrology consulted for emergent HD.  She was subsequently admitted to the stepdown unit for further workup and treatment.    REVIEW OF SYSTEMS:  Nausea and lower right abdominal pain this AM  SUBJECTIVE:  No complaints of chest pain or dysnoea.  VITAL SIGNS: Temp:  [98.4 F (36.9 C)-99.9 F (37.7 C)] 98.8 F (37.1 C) (04/27 0744) Pulse Rate:  [73-109] 95 (04/27 1100) Resp:  [12-25] 24 (04/27 1100) BP: (121-156)/(58-78) 142/73 (04/27 1100) SpO2:  [90 %-100 %] 93 % (04/27 1100)  PHYSICAL EXAMINATION: General: well developed, well nourished female Neuro: alert and oriented, follows commands  HEENT: supple, no JVD  Cardiovascular: nsr, rrr, no R/G Lungs: crackles throughout, even, non labored  Abdomen: +BS x4, obese, soft, mildly tender left lower Quadrant, non distended  Musculoskeletal: trace bilateral lower extremity edema  Skin: intact no rashes or lesions   Recent Labs  Lab 01/11/18 1804 01/12/18 0836 01/12/18 1800 01/13/18 0413  NA 135 136  --  137  K 5.9* 3.2*  3.2* 3.5  CL 106 95*  --  96*  CO2 14* 28  --  28  BUN 98* 35*  --  21*  CREATININE 11.54* 5.93*  --  4.26*  GLUCOSE 137* 58*  --  66   Recent Labs  Lab 01/12/18 0836 01/12/18 0950 01/13/18 0413  HGB 8.1* 7.6* 8.6*  HCT 23.5* 22.7* 25.7*  WBC 10.1 10.3 9.2  PLT 203 197 219   Dg Chest Port 1 View  Result Date: 01/12/2018 CLINICAL DATA:  Patient admitted with shortness of breath 01/11/2018. EXAM: PORTABLE CHEST 1 VIEW COMPARISON:  Single-view of the chest 01/11/2018 and 12/22/2017. FINDINGS: Marked cardiomegaly is again seen. Bilateral airspace disease is improved. No pneumothorax or pleural effusion. Lungs appear emphysematous. IMPRESSION: Improved bilateral airspace disease compatible with resolving edema and/or pneumonia. Cardiomegaly. Emphysema. Electronically Signed   By: Inge Rise M.D.   On: 01/12/2018 09:23   Dg Chest Portable 1 View  Result Date: 01/11/2018 CLINICAL DATA:  Shortness of breath EXAM: PORTABLE CHEST 1 VIEW COMPARISON:  12/07/2017, 12/22/2017, 08/27/2016 FINDINGS: Cardiomegaly with vascular congestion. Moderate diffuse bilateral interstitial opacities suspicious for edema. Patchy focal airspace disease in the left mid lung and bilateral bases. Aortic atherosclerosis. No pneumothorax. IMPRESSION: 1. Cardiomegaly with vascular congestion and moderate diffuse interstitial opacity suspect for pulmonary edema 2. Patchy focal airspace disease in the left mid lung and bilateral bases, possible superimposed pneumonia. Electronically Signed   By: Donavan Foil M.D.   On: 01/11/2018 18:28   Dg Hip Unilat With Pelvis 2-3 Views Right  Result Date: 01/13/2018 CLINICAL DATA:  Right hip pain after fall 1 day ago. EXAM: DG HIP (WITH OR WITHOUT PELVIS) 2-3V RIGHT COMPARISON:  Right hip x-rays dated July 05, 2016. FINDINGS: No acute fracture or dislocation. Moderate right and mild left hip osteoarthritis, similar to prior study. Degenerative changes of the pubic symphysis and  sacroiliac joints, which remain intact. Osteopenia. Unchanged right iliac stent and pelvic phleboliths. IMPRESSION: 1.  No acute osseous abnormality. 2. Unchanged moderate right and mild left hip osteoarthritis. Electronically Signed   By: Titus Dubin M.D.   On: 01/13/2018 10:13    ASSESSMENT / PLAN: Acute on chronic hypoxic respiratory failure secondary to pulmonary edema which has resolved. She is now on RA Oxygen Right lower lobe Pneumonia  ESRD on HD  Hypomagnesemia  NSTEMI- recent cath, EF 55%,continue Heparin drip  Anemia without obvious acute blood loss Early AM hypoglycemia- close monitor  Active ongoing nicotine dependency Hx: Stroke, COPD, and Asthma  P: Continue Heparin drip  Until 01/14/18, ASA and Plavix.  Cardiology on consult Prn bronchodilator therapy Trend troponin's Nephrology follow up Judicious Magnesemia replacement Avoid nephrotoxic medications Monitor for s/sx of bleeding and transfuse for hgb <7 Get early AM CBG  FAMILY:  Will update when available  Disp: patient can be transferred to telemetry.  Cammie Sickle, MD Pulmonary/Critical Care PCCM Consult Pager [phone] (please enter 7 digits)

## 2018-01-13 NOTE — Progress Notes (Signed)
[place] for Heparin Indication: chest pain/ACS  Allergies  Allergen Reactions  . Nystatin Hives and Itching  . Sulfa Antibiotics Swelling, Hives and Rash  . Niaspan [Niacin] Other (See Comments) and Hives    Patient Measurements: Height: 5\' 6"  (167.6 cm) Weight: 223 lb 12.3 oz (101.5 kg) IBW/kg (Calculated) : 59.3 Heparin Dosing Weight: 82 kg  Vital Signs: Temp: 98.8 F (37.1 C) (04/27 0744) Temp Source: Oral (04/27 0744) BP: 142/73 (04/27 1100) Pulse Rate: 95 (04/27 1100)  Labs: Recent Labs    01/11/18 1804  01/12/18 0541 01/12/18 0836 01/12/18 0950 01/12/18 1800 01/13/18 0413 01/13/18 1003  HGB 8.4*  --   --  8.1* 7.6*  --  8.6*  --   HCT 26.0*  --   --  23.5* 22.7*  --  25.7*  --   PLT 240  --   --  203 197  --  219  --   APTT  --   --   --   --  41*  --   --   --   LABPROT  --   --   --   --  14.6  --   --   --   INR  --   --   --   --  1.15  --   --   --   HEPARINUNFRC  --   --   --   --   --  0.12* 0.25* 0.25*  CREATININE 11.54*  --   --  5.93*  --   --  4.26*  --   TROPONINI 0.16*   < > 3.46* 4.16*  --   --  2.68*  --    < > = values in this interval not displayed.    Estimated Creatinine Clearance: 15.4 mL/min (A) (by C-G formula based on SCr of 4.26 mg/dL (H)).   Medical History: Past Medical History:  Diagnosis Date  . Asthma   . CAD (coronary artery disease)   . Chronic lower back pain   . COPD (chronic obstructive pulmonary disease) ([place])   . Depression   . Diabetes mellitus without complication (HCC)    NIDD  . H/O angina pectoris   . H/O blood clots   . Hypercholesteremia   . Hypertension   . Left rotator cuff tear   . Myocardial infarction ([place])   . Obesity   . Renal insufficiency   . Stroke ([place])   . Vertigo, aural     Assessment: 67 y/o F with a h/o ESRD on HD admitted with respiratory failure and NSTEMI. Patient currently receiving heparin drip at 1450 units/hr. Per cardiology, will  continue heparin through 4/28.   Goal of Therapy:  Heparin level 0.3-0.7 units/ml Monitor platelets by anticoagulation protocol: Yes   Plan:  Anti-Xa level remains sub therapeutic, will bolus heparin 1250 units and increase heparin to 1650 units/hr. Will obtain follow up anti-Xa level at 2000.   Pharmacy will continue to monitor and adjust per consult.   MLS 01/13/2018

## 2018-01-13 NOTE — Progress Notes (Signed)
Report called to Junious Dresser, RN on 2A.

## 2018-01-14 LAB — RENAL FUNCTION PANEL
ANION GAP: 13 (ref 5–15)
Albumin: 3.5 g/dL (ref 3.5–5.0)
BUN: 35 mg/dL — ABNORMAL HIGH (ref 6–20)
CALCIUM: 8.2 mg/dL — AB (ref 8.9–10.3)
CO2: 27 mmol/L (ref 22–32)
CREATININE: 6.99 mg/dL — AB (ref 0.44–1.00)
Chloride: 94 mmol/L — ABNORMAL LOW (ref 101–111)
GFR calc non Af Amer: 5 mL/min — ABNORMAL LOW (ref >60)
GFR, EST AFRICAN AMERICAN: 6 mL/min — AB (ref >60)
Glucose, Bld: 91 mg/dL (ref 65–99)
PHOSPHORUS: 3.7 mg/dL (ref 2.5–4.6)
Potassium: 3.4 mmol/L — ABNORMAL LOW (ref 3.5–5.1)
SODIUM: 134 mmol/L — AB (ref 135–145)

## 2018-01-14 LAB — CBC
HCT: 25 % — ABNORMAL LOW (ref 35.0–47.0)
HEMOGLOBIN: 8.5 g/dL — AB (ref 12.0–16.0)
MCH: 32.1 pg (ref 26.0–34.0)
MCHC: 34 g/dL (ref 32.0–36.0)
MCV: 94.5 fL (ref 80.0–100.0)
PLATELETS: 222 10*3/uL (ref 150–440)
RBC: 2.64 MIL/uL — AB (ref 3.80–5.20)
RDW: 15.5 % — ABNORMAL HIGH (ref 11.5–14.5)
WBC: 8.2 10*3/uL (ref 3.6–11.0)

## 2018-01-14 LAB — GLUCOSE, CAPILLARY
Glucose-Capillary: 87 mg/dL (ref 65–99)
Glucose-Capillary: 89 mg/dL (ref 65–99)

## 2018-01-14 LAB — HEPARIN LEVEL (UNFRACTIONATED): HEPARIN UNFRACTIONATED: 0.32 [IU]/mL (ref 0.30–0.70)

## 2018-01-14 MED ORDER — CEFDINIR 300 MG PO CAPS: 300.0000 mg | ORAL_CAPSULE | Freq: Two times a day (BID) | ORAL | Status: DC

## 2018-01-14 MED ORDER — RENVELA 2.4 G PO PACK: 2.4000 g | PACK | Freq: Two times a day (BID) | ORAL | 0 refills | Status: DC

## 2018-01-14 MED ORDER — CEFDINIR 300 MG PO CAPS: 300.0000 mg | ORAL_CAPSULE | ORAL | Status: DC

## 2018-01-14 MED ORDER — ROSUVASTATIN CALCIUM 20 MG PO TABS: 20.0000 mg | ORAL_TABLET | Freq: Every day | ORAL | 0 refills | Status: DC

## 2018-01-14 MED ORDER — CEFDINIR 300 MG PO CAPS: 300.0000 mg | ORAL_CAPSULE | ORAL | 0 refills | Status: DC

## 2018-01-14 MED ORDER — SENSIPAR 30 MG PO TABS: 30.0000 mg | ORAL_TABLET | Freq: Every day | ORAL | 0 refills | Status: DC

## 2018-01-14 NOTE — Progress Notes (Signed)
[facility] for Heparin Indication: chest pain/ACS  Allergies  Allergen Reactions  . Nystatin Hives and Itching  . Sulfa Antibiotics Swelling, Hives and Rash  . Niaspan [Niacin] Other (See Comments) and Hives    Patient Measurements: Height: 5\' 6"  (167.6 cm) Weight: 222 lb 6.4 oz (100.9 kg) IBW/kg (Calculated) : 59.3 Heparin Dosing Weight: 82 kg  Vital Signs: Temp: 98.9 F (37.2 C) (04/28 0516) Temp Source: Oral (04/28 0516) BP: 158/82 (04/28 0516) Pulse Rate: 100 (04/28 0516)  Labs: Recent Labs    01/12/18 0541 01/12/18 0836 01/12/18 0950  01/13/18 0413 01/13/18 1003 01/13/18 2009 01/14/18 0508  HGB  --  8.1* 7.6*  --  8.6*  --   --  8.5*  HCT  --  23.5* 22.7*  --  25.7*  --   --  25.0*  PLT  --  203 197  --  219  --   --  222  APTT  --   --  41*  --   --   --   --   --   LABPROT  --   --  14.6  --   --   --   --   --   INR  --   --  1.15  --   --   --   --   --   HEPARINUNFRC  --   --   --    < > 0.25* 0.25* 0.33 0.32  CREATININE  --  5.93*  --   --  4.26*  --   --  6.99*  TROPONINI 3.46* 4.16*  --   --  2.68*  --   --   --    < > = values in this interval not displayed.    Estimated Creatinine Clearance: 9.4 mL/min (A) (by C-G formula based on SCr of 6.99 mg/dL (H)).   Medical History: Past Medical History:  Diagnosis Date  . Asthma   . CAD (coronary artery disease)   . Chronic lower back pain   . COPD (chronic obstructive pulmonary disease) ([place])   . Depression   . Diabetes mellitus without complication (HCC)    NIDD  . H/O angina pectoris   . H/O blood clots   . Hypercholesteremia   . Hypertension   . Left rotator cuff tear   . Myocardial infarction ([place])   . Obesity   . Renal insufficiency   . Stroke ([place])   . Vertigo, aural     Assessment: 67 y/o F with a h/o ESRD on HD admitted with respiratory failure and NSTEMI. Patient currently receiving heparin drip at 1450 units/hr. Per cardiology, will continue  heparin through 4/28.   Goal of Therapy:  Heparin level 0.3-0.7 units/ml Monitor platelets by anticoagulation protocol: Yes   Plan:  Anti-Xa level remains sub therapeutic, will bolus heparin 1250 units and increase heparin to 1650 units/hr. Will obtain follow up anti-Xa level at 2000.   04/27 @ 2000 HL 0.33 therapeutic. Will continue current rate and will recheck HL w/ am labs.  04/28 @ 0500 HL 0.32 therapeutic. Will continue current rate and will recheck HL w/ am labs.  Pharmacy will continue to monitor and adjust per consult.   Tobie Lords, PharmD, BCPS Clinical Pharmacist 01/14/2018

## 2018-01-14 NOTE — Progress Notes (Signed)
[facility]  ROUNDING NOTE   Subjective:   Denies any complaints this morning.   Heparin gtt.   Objective:  Vital signs in last 24 hours:  Temp:  [98 F (36.7 C)-98.9 F (37.2 C)] 98.5 F (36.9 C) (04/28 0727) Pulse Rate:  [95-108] 99 (04/28 0727) Resp:  [13-25] 18 (04/28 0727) BP: (135-166)/(74-88) 166/81 (04/28 0727) SpO2:  [90 %-99 %] 94 % (04/28 0727) Weight:  [100.9 kg (222 lb 6.4 oz)] 100.9 kg (222 lb 6.4 oz) (04/27 1529)  Weight change:  Filed Weights   01/11/18 2150 01/12/18 0228 01/13/18 1529  Weight: 102.6 kg (226 lb 3.1 oz) 101.5 kg (223 lb 12.3 oz) 100.9 kg (222 lb 6.4 oz)    Intake/Output: I/O last 3 completed shifts: In: 1509.3 [P.O.:954; I.V.:555.3] Out: 2000 [Other:2000]   Intake/Output this shift:  Total I/O In: 240 [P.O.:240] Out: -   Physical Exam: General: Sitting up in bed  Head: Normocephalic, atraumatic. Moist oral mucosal membranes  Eyes: Anicteric,   Neck: Supple, trachea midline  Lungs:  clear  Heart: Regular rate and rhythm  Abdomen:  Soft, nontender, obese  Extremities: no peripheral edema.  Neurologic: Nonfocal, moving all four extremities  Skin: No lesions  Access: Left AVF    Basic Metabolic Panel: Recent Labs  Lab 01/11/18 1804 01/12/18 0836 01/12/18 1800 01/13/18 0413 01/14/18 0508  NA 135 136  --  137 134*  K 5.9* 3.2* 3.2* 3.5 3.4*  CL 106 95*  --  96* 94*  CO2 14* 28  --  28 27  GLUCOSE 137* 58*  --  66 91  BUN 98* 35*  --  21* 35*  CREATININE 11.54* 5.93*  --  4.26* 6.99*  CALCIUM 8.3* 8.5*  --  8.7* 8.2*  MG  --   --   --  1.5*  --   PHOS  --   --   --  2.3* 3.7    Liver Function Tests: Recent Labs  Lab 01/11/18 1804 01/13/18 0413 01/14/18 0508  AST 25  --   --   ALT 11*  --   --   ALKPHOS 73  --   --   BILITOT 0.6  --   --   PROT 9.0*  --   --   ALBUMIN 3.9 3.6 3.5   No results for input(s): LIPASE, AMYLASE in the last 168 hours. No results for input(s): AMMONIA in the last 168  hours.  CBC: Recent Labs  Lab 01/11/18 1804 01/12/18 0836 01/12/18 0950 01/13/18 0413 01/14/18 0508  WBC 12.5* 10.1 10.3 9.2 8.2  NEUTROABS 9.4*  --   --   --   --   HGB 8.4* 8.1* 7.6* 8.6* 8.5*  HCT 26.0* 23.5* 22.7* 25.7* 25.0*  MCV 96.8 92.9 92.6 93.7 94.5  PLT 240 203 197 219 222    Cardiac Enzymes: Recent Labs  Lab 01/11/18 1804 01/11/18 2212 01/12/18 0541 01/12/18 0836 01/13/18 0413  TROPONINI 0.16* 0.70* 3.46* 4.16* 2.68*    BNP: Invalid input(s): POCBNP  CBG: Recent Labs  Lab 01/11/18 2023 01/13/18 1402 01/14/18 0518 01/14/18 0738  GLUCAP 105* 113* 87 53    Microbiology: Results for orders placed or performed during the hospital encounter of 01/11/18  MRSA PCR Screening     Status: None   Collection Time: 01/11/18  8:33 PM  Result Value Ref Range Status   MRSA by PCR NEGATIVE NEGATIVE Final    Comment:  The GeneXpert MRSA Assay (FDA approved for NASAL specimens only), is one component of a comprehensive MRSA colonization surveillance program. It is not intended to diagnose MRSA infection nor to guide or monitor treatment for MRSA infections. Performed at [facility], [address] [place], [place] [address]     Coagulation Studies: Recent Labs    01/12/18 0950  LABPROT 14.6  INR 1.15    Urinalysis: No results for input(s): COLORURINE, LABSPEC, PHURINE, GLUCOSEU, HGBUR, BILIRUBINUR, KETONESUR, PROTEINUR, UROBILINOGEN, NITRITE, LEUKOCYTESUR in the last 72 hours.  Invalid input(s): APPERANCEUR    Imaging: Dg Hip Unilat With Pelvis 2-3 Views Right  Result Date: 01/13/2018 CLINICAL DATA:  Right hip pain after fall 1 day ago. EXAM: DG HIP (WITH OR WITHOUT PELVIS) 2-3V RIGHT COMPARISON:  Right hip x-rays dated July 05, 2016. FINDINGS: No acute fracture or dislocation. Moderate right and mild left hip osteoarthritis, similar to prior study. Degenerative changes of the pubic symphysis and sacroiliac joints, which  remain intact. Osteopenia. Unchanged right iliac stent and pelvic phleboliths. IMPRESSION: 1.  No acute osseous abnormality. 2. Unchanged moderate right and mild left hip osteoarthritis. Electronically Signed   By: Titus Dubin M.D.   On: 01/13/2018 10:13     Medications:   . sodium chloride Stopped (01/12/18 1331)   . aspirin EC  81 mg Oral Daily  . calcium carbonate  600 mg of elemental calcium Oral Q breakfast  . [START ON 01/15/2018] cefdinir  300 mg Oral Once per day on Mon Tue Thu Sat  . cinacalcet  30 mg Oral Daily  . clopidogrel  75 mg Oral Daily  . epoetin (EPOGEN/PROCRIT) injection  10,000 Units Intravenous Q T,Th,Sa-HD  . feeding supplement (NEPRO CARB STEADY)  237 mL Oral BID BM  . gabapentin  100 mg Oral QHS  . nicotine  7 mg Transdermal Daily  . rosuvastatin  20 mg Oral Daily  . senna-docusate  1 tablet Oral QHS  . sevelamer carbonate  2.4 g Oral BID WC  . sodium chloride flush  3 mL Intravenous Q12H  . sodium polystyrene  30 g Oral Once  . venlafaxine XR  37.5 mg Oral QAC breakfast   sodium chloride, acetaminophen **OR** acetaminophen, HYDROcodone-acetaminophen, ipratropium-albuterol, nitroGLYCERIN, ondansetron **OR** ondansetron (ZOFRAN) IV, sodium chloride flush  Assessment/ Plan:  [name] is a 67 y.o. black female with end stage renal disease on hemodialysis, coronary artery disease status post CABG, hypertension, peripheral vascular disease, diabetes mellitus type 2, hyperlipidemia, bilateral total knee replacement, PD catheter removal June 30, 2017  [place] [address]TTS/left AVF/ 103kg  1.  ESRD on hemodialysis: emergent hemodialysis on admission hyperkalemia, metabolic acidosis and volume overload. Extra hemodialysis treatment on Friday due to pulmonary edema.  No acute indication for dialysis today.  Will monitor daily for dialysis. Next scheduled dialysis for Tuesday.   2. Acute respiratory failure: breathing room air today Required  HFNC Acute exacerbation of diastolic congestive heart failure, acute COPD exacerbation with ongoing tobacco use and volume overload from missing two consecutive outpatient hemodialysis treatments.   3. Hypertension: blood pressure at goal. Currently not on any blood pressure agents.   4.  Anemia of chronic kidney disease: hemoglobin 8.5 - EPO with HD treatment  5.  Secondary hyperparathyroidism: with hyperphosphatemia:   - Continue cinacalcet - TUMS and sevelamer for binding  6. Acute coronary syndrome: - Heparin gtt - Appreciate cardiology input.    LOS: 3 Kimberly Shaffer 4/28/201911:37 AM

## 2018-01-14 NOTE — Discharge Instructions (Signed)
Resume your HemoDialysis three times a week as before

## 2018-01-14 NOTE — Progress Notes (Signed)
Pharmacist - Prescriber Commuication  Cefdinir dose adjusted for hemodialysis. Give cefdinir 300 mg po Q-Monday, Tuesday, Thursday, and Saturday - give AFTER dialysis on dialysis days.  Ayla Dunigan A. [place], [place].D., BCPS Clinical Pharmacist 01/14/18 10:56

## 2018-01-14 NOTE — Discharge Summary (Signed)
[place] [facility] [place] NAME: Kimberly Shaffer    MR#:  [id number]  DATE OF BIRTH:  03-01-1951  DATE OF ADMISSION:  01/11/2018 ADMITTING PHYSICIAN: Saundra Shelling, MD  DATE OF DISCHARGE: 01/14/2018  PRIMARY CARE PHYSICIAN: [place], [facility]    ADMISSION DIAGNOSIS:  End stage renal Shaffer ([place]) [N18.6] Acute pulmonary edema (HCC) [J81.0]  DISCHARGE DIAGNOSIS:  acute pulmonary edema acute hypoxic respiratory failure secondary to acute pulmonary edema improved NSTEMI setting of respiratory failure and pulmonary edema--- medicalmanagement ESRD on HD Pneumonia SECONDARY DIAGNOSIS:   Past Medical History:  Diagnosis Date  . Asthma   . CAD (coronary artery Shaffer)   . Chronic lower back pain   . Kimberly Shaffer (chronic obstructive pulmonary Shaffer) ([place])   . Depression   . Kimberly mellitus without complication (HCC)    NIDD  . H/O angina pectoris   . H/O blood clots   . Hypercholesteremia   . Kimberly Shaffer   . Left rotator cuff tear   . Myocardial infarction ([place])   . Obesity   . Renal insufficiency   . Stroke ([place])   . Vertigo, aural     HOSPITAL COURSE:   Kimberly a67 y.o.femalewith a known history of coronary artery Shaffer, Kimberly Shaffer, Kimberly Shaffer, Kimberly Shaffer, Kimberly Shaffer, Kimberly ShafferPatient missed dialysis on Tuesday and Thursday  *Acute hypoxic respiratory failure  Continue BiPAP for respiratory failure-- patient now on high flow nasal cannula oxygen --- now on room air -Empiric antibiotic for suspected pneumonia  *Acute pulmonary edema and fluid overload Secondary to missing dialysis--- patientunderwenturgent hemodialysis with ultrafiltration. -Nephrology consult appreciated.  *NSTEMI with Elevated troponin and absence of chest pain or new EKG changes. - Patient was seen by Dr. Saralyn Pilar.  Recommends continue heparin for 48 to 72 hours--now d/ced and placed on plavix - Patient recently had all cardiac catheterization that revealed chronically occluded [name] to LAD and chronically occluded RCA with patent stands to LAD and left circumflex therefore cardiology recommend defer  cardiac catheterization  *Hyperkalemia received Oral Kayexalate   *Kimberly renal Shaffer Continue dialysis on her schedule  *Anemia of chronic Shaffer Monitor hemoglobin hematocrit  *DVT prophylaxis with subcu heparin   CONSULTS OBTAINED:  Treatment Team:  Lavonia Dana, MD Teodoro Spray, MD Isaias Cowman, MD  DRUG ALLERGIES:   Allergies  Allergen Reactions  . Nystatin Hives and Itching  . Sulfa Antibiotics Swelling, Hives and Rash  . Niaspan [Niacin] Other (See Comments) and Hives    DISCHARGE MEDICATIONS:   Allergies as of 01/14/2018      Reactions   Nystatin Hives, Itching   Sulfa Antibiotics Swelling, Hives, Rash   Niaspan [niacin] Other (See Comments), Hives      Medication List    STOP taking these medications   HYDROcodone-acetaminophen 5-325 MG tablet Commonly known as:  NORCO/VICODIN     TAKE these medications   albuterol 108 (90 Base) MCG/ACT inhaler Commonly known as:  PROVENTIL HFA;VENTOLIN HFA Inhale Shaffer puffs into the lungs every 6 (six) hours as needed for wheezing or shortness of breath.   aspirin EC 81 MG tablet Take 81 mg by mouth daily.   calcium carbonate 1500 (600 Ca) MG Tabs tablet Commonly known as:  OSCAL Take 600 mg of elemental calcium by mouth daily with breakfast.   cefdinir 300 MG capsule Commonly known as:  OMNICEF Take 1 capsule (300 mg total) by  mouth 4 (four) times a week. Mon tue thur and sat   clopidogrel 75 MG tablet Commonly known as:  PLAVIX Take 1 tablet by mouth daily.   feeding supplement (NEPRO CARB STEADY) Liqd Take 237 mLs by mouth Shaffer (two) times daily between meals.   gabapentin 100 MG capsule Commonly  known as:  NEURONTIN Take 100 mg by mouth at bedtime.   ipratropium 0.02 % nebulizer solution Commonly known as:  ATROVENT Take Shaffer.5 mLs (0.5 mg total) by nebulization every 6 (six) hours as needed for wheezing or shortness of breath.   meclizine 12.5 MG tablet Commonly known as:  ANTIVERT Take 1 tablet (12.5 mg total) by mouth 3 (three) times daily as needed for dizziness or nausea. What changed:  how much to take   meloxicam 15 MG tablet Commonly known as:  MOBIC Take 1 tablet (15 mg total) by mouth daily.   methocarbamol 750 MG tablet Commonly known as:  ROBAXIN Take 750 mg by mouth Shaffer (two) times daily as needed for muscle spasms.   nicotine 21 mg/24hr patch Commonly known as:  NICODERM CQ - dosed in mg/24 hours Place 1 patch (21 mg total) onto the skin daily.   nitroGLYCERIN 0.4 MG/SPRAY spray Commonly known as:  NITROLINGUAL Place Shaffer sprays under the tongue every 5 (five) minutes x 3 doses as needed for chest pain.   Oxycodone HCl 10 MG Tabs Take 10 mg by mouth every 6 (six) hours as needed.   pantoprazole 40 MG tablet Commonly known as:  PROTONIX Take 40 mg by mouth Shaffer (two) times daily.   RENVELA Shaffer.4 g Pack Generic drug:  sevelamer carbonate Take Shaffer.4 g by mouth Shaffer (two) times daily.   rosuvastatin 20 MG tablet Commonly known as:  CRESTOR Take 1 tablet (20 mg total) by mouth daily.   SENSIPAR 30 MG tablet Generic drug:  cinacalcet Take 1 tablet (30 mg total) by mouth daily.   SPIRIVA HANDIHALER 18 MCG inhalation capsule Generic drug:  tiotropium Place 1 capsule into inhaler and inhale daily.   SYMBICORT 160-4.5 MCG/ACT inhaler Generic drug:  budesonide-formoterol Inhale Shaffer puffs into the lungs Shaffer (two) times daily.   venlafaxine XR 37.5 MG 24 hr capsule Commonly known as:  EFFEXOR-XR Take 37.5 mg by mouth daily with breakfast.       If you experience worsening of your admission symptoms, develop shortness of breath, life threatening emergency, suicidal or  homicidal thoughts you must seek medical attention immediately by calling 911 or calling your MD immediately  if symptoms less severe.  You Must read complete instructions/literature along with all the possible adverse reactions/side effects for all the Medicines you take and that have been prescribed to you. Take any new Medicines after you have completely understood and accept all the possible adverse reactions/side effects.   Please note  You were cared for by a hospitalist during your hospital stay. If you have any questions about your discharge medications or the care you received while you were in the hospital after you are discharged, you can call the unit and asked to speak with the hospitalist on call if the hospitalist that took care of you is not available. Once you are discharged, your primary care physician will handle any further medical issues. Please note that NO REFILLS for any discharge medications will be authorized once you are discharged, as it is imperative that you return to your primary care physician (or establish a relationship with a primary care physician if  you do not have one) for your aftercare needs so that they can reassess your need for medications and monitor your lab values. Today   SUBJECTIVE    She feels a lot better VITAL SIGNS:  Blood pressure (!) 166/81, pulse 99, temperature 98.5 F (36.9 C), temperature source Oral, resp. rate 18, height 5\' 6"  (1.676 m), weight 100.9 kg (222 lb 6.4 oz), SpO2 94 %.  I/O:    Intake/Output Summary (Last 24 hours) at 01/14/2018 1112 Last data filed at 01/14/2018 1010 Gross per 24 hour  Intake 750.6 ml  Output 0 ml  Net 750.6 ml    PHYSICAL EXAMINATION:  GENERAL:  67 y.o.-year-old patient lying in the bed with no acute distress.  EYES: Pupils equal, round, reactive to light and accommodation. No scleral icterus. Extraocular muscles intact.  HEENT: Head atraumatic, normocephalic. Oropharynx and nasopharynx clear.   NECK:  Supple, no jugular venous distention. No thyroid enlargement, no tenderness.  LUNGS: decreased breath sounds bilaterally, no wheezing, rales,rhonchi or crepitation. No use of accessory muscles of respiration.  CARDIOVASCULAR: S1, S2 normal. No murmurs, rubs, or gallops.  ABDOMEN: Soft, non-tender, non-distended. Bowel sounds present. No organomegaly or mass.  EXTREMITIES: No pedal edema, cyanosis, or clubbing.  NEUROLOGIC: Cranial nerves II through XII are intact. Muscle strength 5/5 in all extremities. Sensation intact. Gait not checked.  PSYCHIATRIC:  patient is alert and oriented x 3.  SKIN: No obvious rash, lesion, or ulcer.   DATA REVIEW:   CBC  Recent Labs  Lab 01/14/18 0508  WBC 8.Shaffer  HGB 8.5*  HCT 25.0*  PLT 222    Chemistries  Recent Labs  Lab 01/11/18 1804  01/13/18 0413 01/14/18 0508  NA 135   < > 137 134*  K 5.9*   < > 3.5 3.4*  CL 106   < > 96* 94*  CO2 14*   < > 28 27  GLUCOSE 137*   < > 66 91  BUN 98*   < > 21* 35*  CREATININE 11.54*   < > 4.26* 6.99*  CALCIUM 8.3*   < > 8.7* 8.Shaffer*  MG  --   --  1.5*  --   AST 25  --   --   --   ALT 11*  --   --   --   ALKPHOS 73  --   --   --   BILITOT 0.6  --   --   --    < > = values in this interval not displayed.    Microbiology Results   Recent Results (from the past 240 hour(s))  MRSA PCR Screening     Status: None   Collection Time: 01/11/18  8:33 PM  Result Value Ref Range Status   MRSA by PCR NEGATIVE NEGATIVE Final    Comment:        The GeneXpert MRSA Assay (FDA approved for NASAL specimens only), is one component of a comprehensive MRSA colonization surveillance program. It is not intended to diagnose MRSA infection nor to guide or monitor treatment for MRSA infections. Performed at [facility], [address] [place], [place] [address]     RADIOLOGY:  Dg Hip Unilat With Pelvis Shaffer-3 Views Right  Result Date: 01/13/2018 CLINICAL DATA:  Right hip pain after fall 1 day ago.  EXAM: DG HIP (WITH OR WITHOUT PELVIS) Shaffer-3V RIGHT COMPARISON:  Right hip x-rays dated July 05, 2016. FINDINGS: No acute fracture or dislocation. Moderate right and mild left hip  osteoarthritis, similar to prior study. Degenerative changes of the pubic symphysis and sacroiliac joints, which remain intact. Osteopenia. Unchanged right iliac stent and pelvic phleboliths. IMPRESSION: 1.  No acute osseous abnormality. Shaffer. Unchanged moderate right and mild left hip osteoarthritis. Electronically Signed   By: Titus Dubin M.D.   On: 01/13/2018 10:13     Management plans discussed with the patient, family and they are in agreement.  CODE STATUS:     Code Status Orders  (From admission, onward)        Start     Ordered   01/11/18 2031  Full code  Continuous     01/11/18 2030    Code Status History    Date Active Date Inactive Code Status Order ID Comments User Context   12/25/2017 0835 12/25/2017 1743 Full Code [id number]  Teodoro Spray, MD Inpatient   12/23/2017 0927 12/25/2017 0834 Full Code [id number]  Harrie Foreman, MD Inpatient   12/08/2017 0357 12/08/2017 2344 Full Code [id number]  Harrie Foreman, MD Inpatient   06/28/2017 0749 07/01/2017 2218 Full Code [id number]  Harrie Foreman, MD Inpatient   07/19/2016 0857 07/19/2016 1319 Full Code [id number]  Corey Skains, MD Inpatient   07/05/2016 2236 07/09/2016 1950 Full Code [id number]  Harvie Bridge, DO Inpatient   12/17/2015 0812 4/Shaffer/2017 1957 Full Code [id number]  Saundra Shelling, MD Inpatient      TOTAL TIME TAKING CARE OF THIS PATIENT: *40* minutes.    Fritzi Mandes M.D on 01/14/2018 at 11:12 AM  Between 7am to 6pm - Pager - [phone] After 6pm go to [email or web address] - password [email or web address] [facility]  Office  [phone]  CC: Primary care physician; [facility]

## 2018-01-14 NOTE — Progress Notes (Signed)
[facility] Cardiology  SUBJECTIVE: Patient laying in bed, denies chest pain or shortness of breath, wishes to go home   Vitals:   01/13/18 1529 01/13/18 2027 01/14/18 0516 01/14/18 0727  BP:  (!) 162/88 (!) 158/82 (!) 166/81  Pulse:  (!) 108 100 99  Resp:  18 17 18   Temp:  98.8 F (37.1 C) 98.9 F (37.2 C) 98.5 F (36.9 C)  TempSrc:  Oral Oral Oral  SpO2:  92% 98% 94%  Weight: 100.9 kg (222 lb 6.4 oz)     Height: 5\' 6"  (1.676 m)        Intake/Output Summary (Last 24 hours) at 01/14/2018 1035 Last data filed at 01/14/2018 1010 Gross per 24 hour  Intake 750.6 ml  Output 0 ml  Net 750.6 ml      PHYSICAL EXAM  General: Well developed, well nourished, in no acute distress HEENT:  Normocephalic and atramatic Neck:  No JVD.  Lungs: Clear bilaterally to auscultation and percussion. Heart: HRRR . Normal S1 and S2 without gallops or murmurs.  Abdomen: Bowel sounds are positive, abdomen soft and non-tender  Msk:  Back normal, normal gait. Normal strength and tone for age. Extremities: No clubbing, cyanosis or edema.   Neuro: Alert and oriented X 3. Psych:  Good affect, responds appropriately   LABS: Basic Metabolic Panel: Recent Labs    01/13/18 0413 01/14/18 0508  NA 137 134*  K 3.5 3.4*  CL 96* 94*  CO2 28 27  GLUCOSE 66 91  BUN 21* 35*  CREATININE 4.26* 6.99*  CALCIUM 8.7* 8.2*  MG 1.5*  --   PHOS 2.3* 3.7   Liver Function Tests: Recent Labs    01/11/18 1804 01/13/18 0413 01/14/18 0508  AST 25  --   --   ALT 11*  --   --   ALKPHOS 73  --   --   BILITOT 0.6  --   --   PROT 9.0*  --   --   ALBUMIN 3.9 3.6 3.5   No results for input(s): LIPASE, AMYLASE in the last 72 hours. CBC: Recent Labs    01/11/18 1804  01/13/18 0413 01/14/18 0508  WBC 12.5*   < > 9.2 8.2  NEUTROABS 9.4*  --   --   --   HGB 8.4*   < > 8.6* 8.5*  HCT 26.0*   < > 25.7* 25.0*  MCV 96.8   < > 93.7 94.5  PLT 240   < > 219 222   < > = values in this interval not displayed.   Cardiac  Enzymes: Recent Labs    01/12/18 0541 01/12/18 0836 01/13/18 0413  TROPONINI 3.46* 4.16* 2.68*   BNP: Invalid input(s): POCBNP D-Dimer: No results for input(s): DDIMER in the last 72 hours. Hemoglobin A1C: No results for input(s): HGBA1C in the last 72 hours. Fasting Lipid Panel: No results for input(s): CHOL, HDL, LDLCALC, TRIG, CHOLHDL, LDLDIRECT in the last 72 hours. Thyroid Function Tests: No results for input(s): TSH, T4TOTAL, T3FREE, THYROIDAB in the last 72 hours.  Invalid input(s): FREET3 Anemia Panel: No results for input(s): VITAMINB12, FOLATE, FERRITIN, TIBC, IRON, RETICCTPCT in the last 72 hours.  Dg Hip Unilat With Pelvis 2-3 Views Right  Result Date: 01/13/2018 CLINICAL DATA:  Right hip pain after fall 1 day ago. EXAM: DG HIP (WITH OR WITHOUT PELVIS) 2-3V RIGHT COMPARISON:  Right hip x-rays dated July 05, 2016. FINDINGS: No acute fracture or dislocation. Moderate right and mild left hip osteoarthritis,  similar to prior study. Degenerative changes of the pubic symphysis and sacroiliac joints, which remain intact. Osteopenia. Unchanged right iliac stent and pelvic phleboliths. IMPRESSION: 1.  No acute osseous abnormality. 2. Unchanged moderate right and mild left hip osteoarthritis. Electronically Signed   By: Titus Dubin M.D.   On: 01/13/2018 10:13     Echo LVEF 50 to 55%  TELEMETRY: Normal sinus rhythm:  ASSESSMENT AND PLAN:  Active Problems:   Respiratory failure ([place])    1.  Elevated troponin, probable non-STEMI, in the absence of chest pain or new ECG changes, with recent cardiac catheterization 12/24/2017 which revealed occluded LIMA to LAD, occluded RCA, patent stent left main the LAD LAD, patent stent left circumflex 2.  Pulmonary edema/respiratory failure, secondary to noncompliance with hemodialysis, improved after dialysis x3 3.  Chronic anemia  Recommendations  1.  DC heparin 2.  Continue Plavix 75 mg daily 3.  Defer cardiac  catheterization 4.  Consider discharge home later today  INR for now, please call if any questions   Isaias Cowman, MD, PhD, [facility] 01/14/2018 10:35 AM

## 2018-01-14 NOTE — Progress Notes (Signed)
Room air. NSR. Pt reports no pain at this time. IV and tele removed. Discharge instructions given to pt. Pt son to take pt home. Pt has no further concerns at this time.

## 2018-02-26 ENCOUNTER — Inpatient Hospital Stay
Admission: EM | Admit: 2018-02-26 | Discharge: 2018-02-27 | DRG: 640 | Disposition: A | Discharge status: Home / Self Care | Payer: Medicare Other | Attending: Internal Medicine | Admitting: Internal Medicine

## 2018-02-26 ENCOUNTER — Emergency Department: Payer: Medicare Other

## 2018-02-26 ENCOUNTER — Other Ambulatory Visit: Payer: Self-pay

## 2018-02-26 ENCOUNTER — Encounter: Payer: Self-pay | Admitting: *Deleted

## 2018-02-26 DIAGNOSIS — J449 Chronic obstructive pulmonary disease, unspecified: Secondary | ICD-10-CM | POA: Diagnosis not present

## 2018-02-26 DIAGNOSIS — E8770 Fluid overload, unspecified: Principal | ICD-10-CM | POA: Diagnosis present

## 2018-02-26 DIAGNOSIS — I248 Other forms of acute ischemic heart disease: Secondary | ICD-10-CM | POA: Diagnosis not present

## 2018-02-26 DIAGNOSIS — Z992 Dependence on renal dialysis: Secondary | ICD-10-CM

## 2018-02-26 DIAGNOSIS — E1165 Type 2 diabetes mellitus with hyperglycemia: Secondary | ICD-10-CM | POA: Diagnosis not present

## 2018-02-26 DIAGNOSIS — E1122 Type 2 diabetes mellitus with diabetic chronic kidney disease: Secondary | ICD-10-CM | POA: Diagnosis not present

## 2018-02-26 DIAGNOSIS — I2510 Atherosclerotic heart disease of native coronary artery without angina pectoris: Secondary | ICD-10-CM | POA: Diagnosis not present

## 2018-02-26 DIAGNOSIS — J9601 Acute respiratory failure with hypoxia: Secondary | ICD-10-CM | POA: Diagnosis not present

## 2018-02-26 DIAGNOSIS — F17210 Nicotine dependence, cigarettes, uncomplicated: Secondary | ICD-10-CM | POA: Diagnosis present

## 2018-02-26 DIAGNOSIS — Z7982 Long term (current) use of aspirin: Secondary | ICD-10-CM

## 2018-02-26 DIAGNOSIS — Z79899 Other long term (current) drug therapy: Secondary | ICD-10-CM

## 2018-02-26 DIAGNOSIS — I252 Old myocardial infarction: Secondary | ICD-10-CM | POA: Diagnosis not present

## 2018-02-26 DIAGNOSIS — R079 Chest pain, unspecified: Secondary | ICD-10-CM

## 2018-02-26 DIAGNOSIS — Z8673 Personal history of transient ischemic attack (TIA), and cerebral infarction without residual deficits: Secondary | ICD-10-CM

## 2018-02-26 DIAGNOSIS — Z951 Presence of aortocoronary bypass graft: Secondary | ICD-10-CM

## 2018-02-26 DIAGNOSIS — Z7902 Long term (current) use of antithrombotics/antiplatelets: Secondary | ICD-10-CM

## 2018-02-26 DIAGNOSIS — N2581 Secondary hyperparathyroidism of renal origin: Secondary | ICD-10-CM | POA: Diagnosis not present

## 2018-02-26 DIAGNOSIS — Z882 Allergy status to sulfonamides status: Secondary | ICD-10-CM

## 2018-02-26 DIAGNOSIS — J810 Acute pulmonary edema: Secondary | ICD-10-CM | POA: Diagnosis not present

## 2018-02-26 DIAGNOSIS — Z888 Allergy status to other drugs, medicaments and biological substances status: Secondary | ICD-10-CM

## 2018-02-26 DIAGNOSIS — R0902 Hypoxemia: Secondary | ICD-10-CM

## 2018-02-26 DIAGNOSIS — N186 End stage renal disease: Secondary | ICD-10-CM | POA: Diagnosis present

## 2018-02-26 DIAGNOSIS — Z96653 Presence of artificial knee joint, bilateral: Secondary | ICD-10-CM | POA: Diagnosis present

## 2018-02-26 DIAGNOSIS — I5033 Acute on chronic diastolic (congestive) heart failure: Secondary | ICD-10-CM | POA: Diagnosis not present

## 2018-02-26 DIAGNOSIS — I132 Hypertensive heart and chronic kidney disease with heart failure and with stage 5 chronic kidney disease, or end stage renal disease: Secondary | ICD-10-CM | POA: Diagnosis present

## 2018-02-26 DIAGNOSIS — D631 Anemia in chronic kidney disease: Secondary | ICD-10-CM | POA: Diagnosis not present

## 2018-02-26 DIAGNOSIS — Z955 Presence of coronary angioplasty implant and graft: Secondary | ICD-10-CM

## 2018-02-26 DIAGNOSIS — I209 Angina pectoris, unspecified: Secondary | ICD-10-CM | POA: Diagnosis present

## 2018-02-26 DIAGNOSIS — E785 Hyperlipidemia, unspecified: Secondary | ICD-10-CM | POA: Diagnosis not present

## 2018-02-26 LAB — CBC
HEMATOCRIT: 28.7 % — AB (ref 35.0–47.0)
Hemoglobin: 9.6 g/dL — ABNORMAL LOW (ref 12.0–16.0)
MCH: 32.2 pg (ref 26.0–34.0)
MCHC: 33.5 g/dL (ref 32.0–36.0)
MCV: 96 fL (ref 80.0–100.0)
Platelets: 218 10*3/uL (ref 150–440)
RBC: 2.99 MIL/uL — ABNORMAL LOW (ref 3.80–5.20)
RDW: 17.3 % — AB (ref 11.5–14.5)
WBC: 6.3 10*3/uL (ref 3.6–11.0)

## 2018-02-26 LAB — BASIC METABOLIC PANEL
Anion gap: 13 (ref 5–15)
BUN: 41 mg/dL — AB (ref 6–20)
CO2: 22 mmol/L (ref 22–32)
Calcium: 9.2 mg/dL (ref 8.9–10.3)
Chloride: 103 mmol/L (ref 101–111)
Creatinine, Ser: 7.25 mg/dL — ABNORMAL HIGH (ref 0.44–1.00)
GFR calc Af Amer: 6 mL/min — ABNORMAL LOW (ref >60)
GFR, EST NON AFRICAN AMERICAN: 5 mL/min — AB (ref >60)
Glucose, Bld: 149 mg/dL — ABNORMAL HIGH (ref 65–99)
POTASSIUM: 4.1 mmol/L (ref 3.5–5.1)
Sodium: 138 mmol/L (ref 135–145)

## 2018-02-26 LAB — GLUCOSE, CAPILLARY
GLUCOSE-CAPILLARY: 146 mg/dL — AB (ref 65–99)
Glucose-Capillary: 110 mg/dL — ABNORMAL HIGH (ref 65–99)
Glucose-Capillary: 111 mg/dL — ABNORMAL HIGH (ref 65–99)

## 2018-02-26 LAB — TROPONIN I
TROPONIN I: 0.5 ng/mL — AB (ref <0.03)
Troponin I: 0.03 ng/mL (ref <0.03)
Troponin I: 0.23 ng/mL (ref <0.03)

## 2018-02-26 LAB — MRSA PCR SCREENING: MRSA by PCR: NEGATIVE

## 2018-02-26 MED ORDER — CLOPIDOGREL BISULFATE 75 MG PO TABS
75.0000 mg | ORAL_TABLET | Freq: Every day | ORAL | Status: DC
  Administered 2018-02-26 – 2018-02-27 (×2): 75 mg via ORAL
  Filled 2018-02-26 (×2): qty 1

## 2018-02-26 MED ORDER — MORPHINE SULFATE (PF) 2 MG/ML IV SOLN
2.0000 mg | INTRAVENOUS | Status: DC | PRN
  Filled 2018-02-26: qty 1

## 2018-02-26 MED ORDER — MORPHINE SULFATE (PF) 2 MG/ML IV SOLN
2.0000 mg | INTRAVENOUS | Status: DC | PRN
Start: 2018-02-26 — End: 2018-02-27

## 2018-02-26 MED ORDER — VENLAFAXINE HCL ER 37.5 MG PO CP24
37.5000 mg | ORAL_CAPSULE | Freq: Every day | ORAL | Status: DC
  Administered 2018-02-26 – 2018-02-27 (×2): 37.5 mg via ORAL
  Filled 2018-02-26 (×2): qty 1

## 2018-02-26 MED ORDER — SEVELAMER CARBONATE 2.4 G PO PACK
2.4000 g | PACK | Freq: Two times a day (BID) | ORAL | Status: DC
  Administered 2018-02-26 (×2): 2.4 g via ORAL
  Filled 2018-02-26 (×4): qty 1

## 2018-02-26 MED ORDER — ROSUVASTATIN CALCIUM 10 MG PO TABS
20.0000 mg | ORAL_TABLET | Freq: Every day | ORAL | Status: DC
  Administered 2018-02-26: 20 mg via ORAL
  Filled 2018-02-26: qty 2

## 2018-02-26 MED ORDER — ASPIRIN EC 81 MG PO TBEC
81.0000 mg | DELAYED_RELEASE_TABLET | Freq: Every day | ORAL | Status: DC
  Administered 2018-02-26 – 2018-02-27 (×2): 81 mg via ORAL
  Filled 2018-02-26 (×2): qty 1

## 2018-02-26 MED ORDER — MORPHINE SULFATE (PF) 4 MG/ML IV SOLN: 4.0000 mg | INTRAVENOUS | Status: DC | PRN

## 2018-02-26 MED ORDER — IPRATROPIUM-ALBUTEROL 0.5-2.5 (3) MG/3ML IN SOLN
3.0000 mL | Freq: Once | RESPIRATORY_TRACT | Status: AC
  Administered 2018-02-26: 3 mL via RESPIRATORY_TRACT
  Filled 2018-02-26: qty 3

## 2018-02-26 MED ORDER — MORPHINE SULFATE (PF) 4 MG/ML IV SOLN
4.0000 mg | Freq: Once | INTRAVENOUS | Status: AC
  Administered 2018-02-26: 4 mg via INTRAVENOUS
  Filled 2018-02-26: qty 1

## 2018-02-26 MED ORDER — EPOETIN ALFA 10000 UNIT/ML IJ SOLN
4000.0000 [IU] | Freq: Once | INTRAMUSCULAR | Status: AC
  Administered 2018-02-26: 4000 [IU] via INTRAVENOUS

## 2018-02-26 MED ORDER — CALCIUM CARBONATE ANTACID 500 MG PO CHEW
600.0000 mg | CHEWABLE_TABLET | Freq: Every day | ORAL | Status: DC
  Administered 2018-02-26 – 2018-02-27 (×2): 600 mg via ORAL
  Filled 2018-02-26 (×2): qty 3

## 2018-02-26 MED ORDER — SENNOSIDES-DOCUSATE SODIUM 8.6-50 MG PO TABS: 1.0000 | ORAL_TABLET | Freq: Every evening | ORAL | Status: DC | PRN

## 2018-02-26 MED ORDER — CHLORHEXIDINE GLUCONATE CLOTH 2 % EX PADS: 6.0000 | MEDICATED_PAD | Freq: Every day | CUTANEOUS | Status: DC

## 2018-02-26 MED ORDER — PANTOPRAZOLE SODIUM 40 MG PO TBEC
40.0000 mg | DELAYED_RELEASE_TABLET | Freq: Two times a day (BID) | ORAL | Status: DC
  Administered 2018-02-26 – 2018-02-27 (×3): 40 mg via ORAL
  Filled 2018-02-26 (×3): qty 1

## 2018-02-26 MED ORDER — MECLIZINE HCL 12.5 MG PO TABS
12.5000 mg | ORAL_TABLET | Freq: Three times a day (TID) | ORAL | Status: DC | PRN
  Filled 2018-02-26: qty 1

## 2018-02-26 MED ORDER — NICOTINE 14 MG/24HR TD PT24
14.0000 mg | MEDICATED_PATCH | Freq: Every day | TRANSDERMAL | Status: DC
  Administered 2018-02-26 – 2018-02-27 (×2): 14 mg via TRANSDERMAL
  Filled 2018-02-26 (×2): qty 1

## 2018-02-26 MED ORDER — ONDANSETRON HCL 4 MG/2ML IJ SOLN: 4.0000 mg | Freq: Four times a day (QID) | INTRAMUSCULAR | Status: DC | PRN

## 2018-02-26 MED ORDER — ACETAMINOPHEN 325 MG PO TABS
650.0000 mg | ORAL_TABLET | ORAL | Status: DC | PRN
  Administered 2018-02-26: 650 mg via ORAL
  Filled 2018-02-26: qty 2

## 2018-02-26 MED ORDER — BISACODYL 5 MG PO TBEC: 5.0000 mg | DELAYED_RELEASE_TABLET | Freq: Every day | ORAL | Status: DC | PRN

## 2018-02-26 MED ORDER — GABAPENTIN 100 MG PO CAPS
100.0000 mg | ORAL_CAPSULE | Freq: Every day | ORAL | Status: DC
  Filled 2018-02-26: qty 1

## 2018-02-26 MED ORDER — HYDROCODONE-ACETAMINOPHEN 7.5-325 MG PO TABS: 1.0000 | ORAL_TABLET | Freq: Four times a day (QID) | ORAL | Status: DC | PRN

## 2018-02-26 MED ORDER — INSULIN ASPART 100 UNIT/ML ~~LOC~~ SOLN: 0.0000 [IU] | Freq: Three times a day (TID) | SUBCUTANEOUS | Status: DC

## 2018-02-26 MED ORDER — NEPRO/CARBSTEADY PO LIQD
237.0000 mL | Freq: Two times a day (BID) | ORAL | Status: DC
  Administered 2018-02-26: 237 mL via ORAL

## 2018-02-26 MED ORDER — CINACALCET HCL 30 MG PO TABS
30.0000 mg | ORAL_TABLET | Freq: Every day | ORAL | Status: DC
  Administered 2018-02-26 – 2018-02-27 (×2): 30 mg via ORAL
  Filled 2018-02-26 (×2): qty 1

## 2018-02-26 MED ORDER — NITROGLYCERIN 0.4 MG SL SUBL: 0.4000 mg | SUBLINGUAL_TABLET | SUBLINGUAL | Status: DC | PRN

## 2018-02-26 MED ORDER — IPRATROPIUM-ALBUTEROL 0.5-2.5 (3) MG/3ML IN SOLN: 3.0000 mL | Freq: Four times a day (QID) | RESPIRATORY_TRACT | Status: DC | PRN

## 2018-02-26 MED ORDER — GUAIFENESIN-DM 100-10 MG/5ML PO SYRP: 5.0000 mL | ORAL_SOLUTION | ORAL | Status: DC | PRN

## 2018-02-26 MED ORDER — METHOCARBAMOL 750 MG PO TABS
750.0000 mg | ORAL_TABLET | Freq: Two times a day (BID) | ORAL | Status: DC | PRN
  Filled 2018-02-26: qty 1

## 2018-02-26 MED ORDER — MOMETASONE FURO-FORMOTEROL FUM 200-5 MCG/ACT IN AERO
2.0000 | INHALATION_SPRAY | Freq: Two times a day (BID) | RESPIRATORY_TRACT | Status: DC
  Administered 2018-02-26 – 2018-02-27 (×3): 2 via RESPIRATORY_TRACT
  Filled 2018-02-26: qty 8.8

## 2018-02-26 MED ORDER — HEPARIN SODIUM (PORCINE) 5000 UNIT/ML IJ SOLN
5000.0000 [IU] | Freq: Three times a day (TID) | INTRAMUSCULAR | Status: DC
  Administered 2018-02-26 – 2018-02-27 (×3): 5000 [IU] via SUBCUTANEOUS
  Filled 2018-02-26 (×3): qty 1

## 2018-02-26 NOTE — ED Notes (Signed)
Admitting MD at bedside.

## 2018-02-26 NOTE — Consult Note (Signed)
Cardiology Consultation Note    Patient ID: Kimberly Shaffer, MRN: [id number], DOB/AGE: [birth year] 67 y.o. Admit date: 02/26/2018   Date of Consult: 02/26/2018 Primary Physician: [facility] Primary Cardiologist: Dr. Nehemiah Massed  Chief Complaint: chest pain Reason for Consultation: elevated troponin Requesting MD: Dr. Darvin Neighbours  HPI: Kimberly Shaffer is a 67 y.o. female with history of end-stage renal disease on hemodialysis, history of known CAD, COPD, diabetes mellitus who was admitted after complaining of left and right neck and left arm pain.  She also states he has occasional was in the right side of her chest.  She complained of shortness of breath.  Symptoms have improved since undergoing hemodialysis.  Troponin is mildly elevated at 0.50.  Chest x-ray predialysis revealed cardiomegaly with diffuse interstitial edema.  Electrocardiogram revealed sinus rhythm nonspecific ST-T wave changes.  Patient is currently pain-free resting comfortably after hemodialysis.  She remains on dual antiplatelet therapy with aspirin and clopidogrel.  She is on Crestor for hyperlipidemia.  She continues to smoke.  Past Medical History:  Diagnosis Date  . Asthma   . CAD (coronary artery disease)   . Chronic lower back pain   . COPD (chronic obstructive pulmonary disease) ([place])   . Depression   . Diabetes mellitus without complication (HCC)    NIDD  . H/O angina pectoris   . H/O blood clots   . Hypercholesteremia   . Hypertension   . Left rotator cuff tear   . Myocardial infarction ([place])   . Obesity   . Renal insufficiency   . Stroke ([place])   . Vertigo, aural       Surgical History:  Past Surgical History:  Procedure Laterality Date  . A/V FISTULAGRAM Left 03/14/2017   Procedure: A/V Fistulagram;  Surgeon: Katha Cabal, MD;  Location: [place] [facility];  Service: Cardiovascular;  Laterality: Left;  . AV FISTULA PLACEMENT Left 11-04-2015  . AV FISTULA PLACEMENT Left 11/04/2015   Procedure: ARTERIOVENOUS (AV) FISTULA CREATION  ( BRACHIAL CEPHALIC );  Surgeon: Katha Cabal, MD;  Location: [facility];  Service: Vascular;  Laterality: Left;  . CARDIAC CATHETERIZATION    . CARDIAC CATHETERIZATION Left 07/19/2016   Procedure: Left Heart Cath and Coronary Angiography;  Surgeon: Corey Skains, MD;  Location: [place] [facility];  Service: Cardiovascular;  Laterality: Left;  . CORONARY ARTERY BYPASS GRAFT  2012  . CORONARY STENT PLACEMENT    . JOINT REPLACEMENT    . KNEE SURGERY Bilateral   . LEFT HEART CATH AND CORONARY ANGIOGRAPHY N/A 12/25/2017   Procedure: LEFT HEART CATH AND CORONARY ANGIOGRAPHY;  Surgeon: Teodoro Spray, MD;  Location: [place] [facility];  Service: Cardiovascular;  Laterality: N/A;  . PERIPHERAL VASCULAR CATHETERIZATION N/A 11/13/2015   Procedure: Dialysis/Perma Catheter Insertion;  Surgeon: Katha Cabal, MD;  Location: [place] [facility];  Service: Cardiovascular;  Laterality: N/A;  . PERIPHERAL VASCULAR CATHETERIZATION N/A 12/04/2015   Procedure: Dialysis/Perma Catheter Insertion;  Surgeon: Katha Cabal, MD;  Location: [place] [facility];  Service: Cardiovascular;  Laterality: N/A;  . PERIPHERAL VASCULAR CATHETERIZATION Left 12/31/2015   Procedure: A/V Shuntogram/Fistulagram;  Surgeon: Algernon Huxley, MD;  Location: [place] [facility];  Service: Cardiovascular;  Laterality: Left;  . PERIPHERAL VASCULAR CATHETERIZATION N/A 12/31/2015   Procedure: A/V Shunt Intervention;  Surgeon: Algernon Huxley, MD;  Location: [place] [facility];  Service: Cardiovascular;  Laterality: N/A;  . PERIPHERAL VASCULAR CATHETERIZATION Left 02/25/2016  Procedure: A/V Shuntogram/Fistulagram;  Surgeon: Algernon Huxley, MD;  Location: [place] [facility];  Service: Cardiovascular;  Laterality: Left;  . PERIPHERAL VASCULAR CATHETERIZATION N/A 03/18/2016   Procedure: Dialysis/Perma Catheter Removal;  Surgeon: Katha Cabal, MD;  Location: [place] [facility];  Service:  Cardiovascular;  Laterality: N/A;  . REMOVAL OF A DIALYSIS CATHETER N/A 06/30/2017   Procedure: REMOVAL OF A DIALYSIS CATHETER;  Surgeon: Katha Cabal, MD;  Location: [facility];  Service: Vascular;  Laterality: N/A;  . REPLACEMENT TOTAL KNEE BILATERAL Bilateral      Home Meds: Prior to Admission medications   Medication Sig Start Date End Date Taking? Authorizing Provider  albuterol (PROVENTIL HFA;VENTOLIN HFA) 108 (90 BASE) MCG/ACT inhaler Inhale 2 puffs into the lungs every 6 (six) hours as needed for wheezing or shortness of breath.    Yes [provider]  aspirin EC 81 MG tablet Take 81 mg by mouth daily.   Yes [provider]  clopidogrel (PLAVIX) 75 MG tablet Take 1 tablet by mouth daily. 01/03/18  Yes [provider]  methocarbamol (ROBAXIN) 750 MG tablet Take 750 mg by mouth 2 (two) times daily as needed for muscle spasms.    Yes [provider]  nitroGLYCERIN (NITROLINGUAL) 0.4 MG/SPRAY spray Place 2 sprays under the tongue every 5 (five) minutes x 3 doses as needed for chest pain.    Yes [provider]  Oxycodone HCl 10 MG TABS Take 10 mg by mouth every 6 (six) hours as needed.  12/28/17  Yes [provider]  pantoprazole (PROTONIX) 40 MG tablet Take 40 mg by mouth 2 (two) times daily.   Yes [provider]  RENVELA 2.4 g PACK Take 2.4 g by mouth 2 (two) times daily. 01/14/18  Yes Fritzi Mandes, MD  SENSIPAR 30 MG tablet Take 1 tablet (30 mg total) by mouth daily. 01/14/18  Yes Fritzi Mandes, MD  SPIRIVA HANDIHALER 18 MCG inhalation capsule Place 1 capsule into inhaler and inhale daily.   Yes [provider]  SYMBICORT 160-4.5 MCG/ACT inhaler Inhale 2 puffs into the lungs 2 (two) times daily.   Yes [provider]  venlafaxine XR (EFFEXOR-XR) 37.5 MG 24 hr capsule Take 37.5 mg by mouth daily with breakfast.   Yes [provider]  calcium carbonate (OSCAL) 1500 (600 Ca) MG TABS tablet Take 600 mg of  elemental calcium by mouth daily with breakfast.    [provider]  gabapentin (NEURONTIN) 100 MG capsule Take 100 mg by mouth at bedtime.    [provider]  ipratropium (ATROVENT) 0.02 % nebulizer solution Take 2.5 mLs (0.5 mg total) by nebulization every 6 (six) hours as needed for wheezing or shortness of breath. Patient not taking: Reported on 02/26/2018 12/24/17   Dustin Flock, MD  meclizine (ANTIVERT) 12.5 MG tablet Take 1 tablet (12.5 mg total) by mouth 3 (three) times daily as needed for dizziness or nausea. Patient not taking: Reported on 02/26/2018 08/27/16   Daymon Larsen, MD  meloxicam (MOBIC) 15 MG tablet Take 1 tablet (15 mg total) by mouth daily. Patient not taking: Reported on 02/26/2018 11/16/17 11/16/18  Caryn Section Linden Dolin, PA-C  nicotine (NICODERM CQ - DOSED IN MG/24 HOURS) 21 mg/24hr patch Place 1 patch (21 mg total) onto the skin daily. Patient not taking: Reported on 01/11/2018 12/24/17   Dustin Flock, MD  Nutritional Supplements (FEEDING SUPPLEMENT, NEPRO CARB STEADY,) LIQD Take 237 mLs by mouth 2 (two) times daily between meals. Patient  not taking: Reported on 02/26/2018 12/08/17   Fritzi Mandes, MD  rosuvastatin (CRESTOR) 20 MG tablet Take 1 tablet (20 mg total) by mouth daily. Patient not taking: Reported on 02/26/2018 01/14/18   Fritzi Mandes, MD    Inpatient Medications:  . aspirin EC  81 mg Oral Daily  . calcium carbonate  600 mg of elemental calcium Oral Q breakfast  . Chlorhexidine Gluconate Cloth  6 each Topical Q0600  . cinacalcet  30 mg Oral Q breakfast  . clopidogrel  75 mg Oral Daily  . feeding supplement (NEPRO CARB STEADY)  237 mL Oral BID BM  . gabapentin  100 mg Oral QHS  . heparin  5,000 Units Subcutaneous Q8H  . insulin aspart  0-15 Units Subcutaneous TID WC  . mometasone-formoterol  2 puff Inhalation BID  . nicotine  14 mg Transdermal Daily  . pantoprazole  40 mg Oral BID  . rosuvastatin  20 mg Oral q1800  . sevelamer carbonate  2.4 g  Oral BID WC  . venlafaxine XR  37.5 mg Oral Q breakfast     Allergies:  Allergies  Allergen Reactions  . Nystatin Hives and Itching  . Sulfa Antibiotics Swelling, Hives and Rash  . Niaspan [Niacin] Other (See Comments) and Hives    Social History   Socioeconomic History  . Marital status: Widowed    Spouse name: Not on file  . Number of children: Not on file  . Years of education: Not on file  . Highest education level: Not on file  Occupational History  . Occupation: retired  Scientific laboratory technician  . Financial resource strain: Not on file  . Food insecurity:    Worry: Not on file    Inability: Not on file  . Transportation needs:    Medical: Not on file    Non-medical: Not on file  Tobacco Use  . Smoking status: Current Every Day Smoker    Packs/day: 0.25    Types: Cigarettes  . Smokeless tobacco: Never Used  Substance and Sexual Activity  . Alcohol use: No    Alcohol/week: 0.0 oz  . Drug use: No  . Sexual activity: Yes  Lifestyle  . Physical activity:    Days per week: Not on file    Minutes per session: Not on file  . Stress: Not on file  Relationships  . Social connections:    Talks on phone: Not on file    Gets together: Not on file    Attends religious service: Not on file    Active member of club or organization: Not on file    Attends meetings of clubs or organizations: Not on file    Relationship status: Not on file  . Intimate partner violence:    Fear of current or ex partner: Not on file    Emotionally abused: Not on file    Physically abused: Not on file    Forced sexual activity: Not on file  Other Topics Concern  . Not on file  Social History Narrative  . Not on file     Family History  Problem Relation Age of Onset  . Cancer Mother   . Cancer Father   . Diabetes Brother   . Heart disease Brother      Review of Systems: A 12-system review of systems was performed and is negative except as noted in the HPI.  Labs: Recent Labs     02/26/18 0253 02/26/18 0737 02/26/18 1146  TROPONINI 0.03* 0.23* 0.50*  Lab Results  Component Value Date   WBC 6.3 02/26/2018   HGB 9.6 (L) 02/26/2018   HCT 28.7 (L) 02/26/2018   MCV 96.0 02/26/2018   PLT 218 02/26/2018    Recent Labs  Lab 02/26/18 0253  NA 138  K 4.1  CL 103  CO2 22  BUN 41*  CREATININE 7.25*  CALCIUM 9.2  GLUCOSE 149*   Lab Results  Component Value Date   CHOL 118 12/17/2015   HDL 24 (L) 12/17/2015   LDLCALC 66 12/17/2015   TRIG 138 12/17/2015   No results found for: DDIMER  Radiology/Studies:  Dg Chest 2 View  Result Date: 02/26/2018 CLINICAL DATA:  Chest pain wakening patient from sleep. EXAM: CHEST - 2 VIEW COMPARISON:  01/12/2018 FINDINGS: Stable cardiomegaly with moderate aortic atherosclerosis. Diffuse interstitial edema is noted. No alveolar consolidation, effusion or pneumothorax. No acute osseous abnormality. IMPRESSION: Stable cardiomegaly with aortic atherosclerosis. Diffuse interstitial edema noted. Electronically Signed   By: Ashley Royalty M.D.   On: 02/26/2018 03:35    Wt Readings from Last 3 Encounters:  02/26/18 105.3 kg (232 lb 2.3 oz)  01/13/18 100.9 kg (222 lb 6.4 oz)  12/24/17 103.6 kg (228 lb 4.8 oz)    EKG: Normal sinus rhythm with nonspecific ST-T wave changes.  No obvious ischemia.  Physical Exam:  Blood pressure 117/67, pulse 71, temperature 98.5 F (36.9 C), temperature source Oral, resp. rate (!) 23, height 5\' 6"  (1.676 m), weight 105.3 kg (232 lb 2.3 oz), SpO2 94 %. Body mass index is 37.47 kg/m. General: Well developed, well nourished, in no acute distress. Head: Normocephalic, atraumatic, sclera non-icteric, no xanthomas, nares are without discharge.  Neck: Negative for carotid bruits. JVD not elevated. Lungs: Decreased breath sounds bilaterally. Heart: RRR with S1 S2. No murmurs, rubs, or gallops appreciated. Abdomen: Soft, non-tender, non-distended with normoactive bowel sounds. No hepatomegaly. No  rebound/guarding. No obvious abdominal masses. Msk:  Strength and tone appear normal for age. Extremities: No clubbing or cyanosis. No edema.  Distal pedal pulses are 2+ and equal bilaterally. Neuro: Alert and oriented X 3. No facial asymmetry. No focal deficit. Moves all extremities spontaneously. Psych:  Responds to questions appropriately with a normal affect.     Assessment and Plan  67 year old female with history of coronary disease, history of end-stage renal disease on hemodialysis as well as hyperlipidemia.  She presented with complaints of chest pain and shortness of breath.  Was felt to be volume overloaded.  Had a mild troponin elevation.  Symptoms relieved with hemodialysis.  She is currently pain-free.  Elevated troponin is likely secondary to demand ischemia in the face of renal insufficiency.  Would continue with current regimen including rosuvastatin at current dose, continue with dual antiplatelet therapy with aspirin and Plavix.  Does not appear to be a candidate for invasive evaluation at this time.  If stable in the morning would discharge with outpatient follow-up with Dr. Nehemiah Massed.  Signed, Teodoro Spray MD 02/26/2018, 4:54 PM Pager: [phone]

## 2018-02-26 NOTE — Progress Notes (Signed)
[facility], [place] 02/26/18  Subjective:   Patient is known to our practice from outpatient dialysis.  She presents for sudden onset of chest pain, left chest tightness, numbness and tightness in her left arm.  This morning she feels better.  Chest x-ray suggests fluid overload.  Patient is 2 kg over her dry weight  currently 2 L nasal cannula. Doesn't use oxygen at home but uses at dialysis Current smoker   Objective:  Vital signs in last 24 hours:  Temp:  [97.7 F (36.5 C)-98.1 F (36.7 C)] 98 F (36.7 C) (06/10 0803) Pulse Rate:  [68-85] 68 (06/10 0803) Resp:  [18-23] 20 (06/10 0803) BP: (141-172)/(65-83) 141/65 (06/10 0803) SpO2:  [89 %-100 %] 98 % (06/10 0803) FiO2 (%):  [2 %] 2 % (06/10 0505) Weight:  [91.6 kg (202 lb)-104.5 kg (230 lb 6.4 oz)] 104.5 kg (230 lb 6.4 oz) (06/10 0643)  Weight change:  Filed Weights   02/26/18 0246 02/26/18 0643  Weight: 91.6 kg (202 lb) 104.5 kg (230 lb 6.4 oz)    Intake/Output:    Intake/Output Summary (Last 24 hours) at 02/26/2018 0936 Last data filed at 02/26/2018 0646 Gross per 24 hour  Intake -  Output 250 ml  Net -250 ml     Physical Exam: General:  No acute distress, laying in the bed  HEENT  anicteric, moist oral mucous membranes  Neck  supple   Pulm/lungs Bilateral expiratory wheezing, mild basilar crackles, O2 [place]  CVS/Heart  regular, no rub or gallop  Abdomen:   Soft, nontender  Extremities:  No peripheral edema  Neurologic:  Alert, oriented  Skin:  No acute rashes  Access:  Left upper extremity AV fistula       Basic Metabolic Panel:  Recent Labs  Lab 02/26/18 0253  NA 138  K 4.1  CL 103  CO2 22  GLUCOSE 149*  BUN 41*  CREATININE 7.25*  CALCIUM 9.2     CBC: Recent Labs  Lab 02/26/18 0253  WBC 6.3  HGB 9.6*  HCT 28.7*  MCV 96.0  PLT 218      Lab Results  Component Value Date   HEPBSAG Negative 01/11/2018   HEPBSAB Reactive 01/11/2018       Microbiology:  No results found for this or any previous visit (from the past 240 hour(s)).  Coagulation Studies: No results for input(s): LABPROT, INR in the last 72 hours.  Urinalysis: No results for input(s): COLORURINE, LABSPEC, PHURINE, GLUCOSEU, HGBUR, BILIRUBINUR, KETONESUR, PROTEINUR, UROBILINOGEN, NITRITE, LEUKOCYTESUR in the last 72 hours.  Invalid input(s): APPERANCEUR    Imaging: Dg Chest 2 View  Result Date: 02/26/2018 CLINICAL DATA:  Chest pain wakening patient from sleep. EXAM: CHEST - 2 VIEW COMPARISON:  01/12/2018 FINDINGS: Stable cardiomegaly with moderate aortic atherosclerosis. Diffuse interstitial edema is noted. No alveolar consolidation, effusion or pneumothorax. No acute osseous abnormality. IMPRESSION: Stable cardiomegaly with aortic atherosclerosis. Diffuse interstitial edema noted. Electronically Signed   By: Ashley Royalty M.D.   On: 02/26/2018 03:35     Medications:    . aspirin EC  81 mg Oral Daily  . calcium carbonate  600 mg of elemental calcium Oral Q breakfast  . cinacalcet  30 mg Oral Q breakfast  . clopidogrel  75 mg Oral Daily  . gabapentin  100 mg Oral QHS  . heparin  5,000 Units Subcutaneous Q8H  . insulin aspart  0-15 Units Subcutaneous TID WC  . mometasone-formoterol  2 puff Inhalation BID  .  pantoprazole  40 mg Oral BID  . rosuvastatin  20 mg Oral q1800  . sevelamer carbonate  2.4 g Oral BID WC  . venlafaxine XR  37.5 mg Oral Q breakfast   acetaminophen, bisacodyl, ipratropium-albuterol, meclizine, methocarbamol, morphine injection **OR** morphine injection, nitroGLYCERIN, ondansetron (ZOFRAN) IV, senna-docusate  Assessment/ Plan:  67 y.o. African-American female with end stage renal disease on hemodialysis, coronary artery disease status post CABG, hypertension, peripheral vascular disease, diabetes mellitus type 2, hyperlipidemia, bilateral total knee replacement, PD catheter removal June 30, 2017  [facility]TTS/left AVF/ 102 kg  1.  End-stage renal disease on hemodialysis 2.  Shortness of breath, chest pain, possible COPD exacerbation, current smoker, CXR suggests fluid overload 3.  Secondary hyperparathyroidism 4.  Anemia chronic kidney disease  Plan: Patient is 2 kg above her dry weight.  Last routine HD was Saturday. We will get her dialyzed to wean off oxygen.  She may need bronchodilators treatment also.  Patient was counseled to quit smoking.  Continue home dose of cinacalcet and sevelamer.  Hemoglobin 9.6.  We will continue Procrit with dialysis.       LOS: 0 Priyal Musquiz Candiss Norse 6/10/20199:36 AM  [place], [place]  Note: This note was prepared with Dragon dictation. Any transcription errors are unintentional

## 2018-02-26 NOTE — ED Notes (Signed)
ED Provider at bedside. 

## 2018-02-26 NOTE — Progress Notes (Signed)
HD tx start    02/26/18 1036  Vital Signs  Pulse Rate 65  Pulse Rate Source Monitor  Resp (!) 22  BP 136/85  BP Location Right Arm  BP Method Automatic  Patient Position (if appropriate) Lying  Oxygen Therapy  SpO2 99 %  O2 Device Nasal Cannula  O2 Flow Rate (L/min) 2 L/min  During Hemodialysis Assessment  Blood Flow Rate (mL/min) 400 mL/min  Arterial Pressure (mmHg) -130 mmHg  Venous Pressure (mmHg) 180 mmHg  Transmembrane Pressure (mmHg) 80 mmHg  Ultrafiltration Rate (mL/min) 830 mL/min  Dialysate Flow Rate (mL/min) 800 ml/min  Conductivity: Machine  14.1  HD Safety Checks Performed Yes  Dialysis Fluid Bolus Normal Saline  Bolus Amount (mL) 250 mL  Intra-Hemodialysis Comments Tx initiated

## 2018-02-26 NOTE — Progress Notes (Signed)
CRITICAL VALUE ALERT  Critical Value:  Troponin 0.23  Date & Time Notied:  02/26/2018 0817  Provider Notified: yes, Sudini   Orders Received/Actions taken: none at this time, will re-page MD, pt axox4, VSS, no complaints

## 2018-02-26 NOTE — Progress Notes (Signed)
Chest pressure likely due to pulmonary edema. Improved after hemodialysis. Continue to monitor. Of oxygen at this time. Mild elevation troponin likely due to CHF and setting of end-stage renal disease. Discussed with Dr. Ubaldo Glassing who will see the patient.  Likely discharge home tomorrow

## 2018-02-26 NOTE — H&P (Addendum)
[facility] NAME: Kimberly Shaffer    MR#:  [id number]  DATE OF BIRTH:  16-Dec-1950  DATE OF ADMISSION:  02/26/2018  PRIMARY CARE PHYSICIAN: [facility]   REQUESTING/REFERRING PHYSICIAN: Loney Hering, MD  CHIEF COMPLAINT:   Chief Complaint  Patient presents with  . Chest Pain    HISTORY OF PRESENT ILLNESS:  Kimberly Shaffer  is a 67 y.o. female with a known history of CAD, COPD, T2NIDDM, ESRD (TTS via LUE AVF) p/w CP/SOB. Pt is an abyssmal historian. She endorses L-sided CP @~2000PM, radiating to L neck + L arm. She cannot characterize it. It is non-reproducible. She stayed at rest, and doesn't know if the pain was exertional. She endorses SOB. She denies cough, hemoptysis, wheezing. She endorses N/V x1 at home. She had dialysis on Saturday. SpO2 80% range in ED.  PAST MEDICAL HISTORY:   Past Medical History:  Diagnosis Date  . Asthma   . CAD (coronary artery disease)   . Chronic lower back pain   . COPD (chronic obstructive pulmonary disease) ([place])   . Depression   . Diabetes mellitus without complication (HCC)    NIDD  . H/O angina pectoris   . H/O blood clots   . Hypercholesteremia   . Hypertension   . Left rotator cuff tear   . Myocardial infarction ([place])   . Obesity   . Renal insufficiency   . Stroke ([place])   . Vertigo, aural     PAST SURGICAL HISTORY:   Past Surgical History:  Procedure Laterality Date  . A/V FISTULAGRAM Left 03/14/2017   Procedure: A/V Fistulagram;  Surgeon: Katha Cabal, MD;  Location: [place] [facility];  Service: Cardiovascular;  Laterality: Left;  . AV FISTULA PLACEMENT Left 11-04-2015  . AV FISTULA PLACEMENT Left 11/04/2015   Procedure: ARTERIOVENOUS (AV) FISTULA CREATION  ( BRACHIAL CEPHALIC );  Surgeon: Katha Cabal, MD;  Location: [facility];  Service: Vascular;  Laterality: Left;  . CARDIAC CATHETERIZATION    . CARDIAC CATHETERIZATION Left 07/19/2016   Procedure: Left Heart Cath and Coronary Angiography;  Surgeon: Corey Skains, MD;  Location: [place] [facility];  Service: Cardiovascular;  Laterality: Left;  . CORONARY ARTERY BYPASS GRAFT  2012  . CORONARY STENT PLACEMENT    . JOINT REPLACEMENT    . KNEE SURGERY Bilateral   . LEFT HEART CATH AND CORONARY ANGIOGRAPHY N/A 12/25/2017   Procedure: LEFT HEART CATH AND CORONARY ANGIOGRAPHY;  Surgeon: Teodoro Spray, MD;  Location: [place] [facility];  Service: Cardiovascular;  Laterality: N/A;  . PERIPHERAL VASCULAR CATHETERIZATION N/A 11/13/2015   Procedure: Dialysis/Perma Catheter Insertion;  Surgeon: Katha Cabal, MD;  Location: [place] [facility];  Service: Cardiovascular;  Laterality: N/A;  . PERIPHERAL VASCULAR CATHETERIZATION N/A 12/04/2015   Procedure: Dialysis/Perma Catheter Insertion;  Surgeon: Katha Cabal, MD;  Location: [place] [facility];  Service: Cardiovascular;  Laterality: N/A;  . PERIPHERAL VASCULAR CATHETERIZATION Left 12/31/2015   Procedure: A/V Shuntogram/Fistulagram;  Surgeon: Algernon Huxley, MD;  Location: [place] [facility];  Service: Cardiovascular;  Laterality: Left;  . PERIPHERAL VASCULAR CATHETERIZATION N/A 12/31/2015   Procedure: A/V Shunt Intervention;  Surgeon: Algernon Huxley, MD;  Location: [place] [facility];  Service: Cardiovascular;  Laterality: N/A;  . PERIPHERAL VASCULAR CATHETERIZATION Left 02/25/2016   Procedure: A/V Shuntogram/Fistulagram;  Surgeon: Algernon Huxley, MD;  Location: [place] [facility];  Service: Cardiovascular;  Laterality: Left;  . PERIPHERAL VASCULAR CATHETERIZATION N/A 03/18/2016   Procedure: Dialysis/Perma Catheter Removal;  Surgeon: Katha Cabal, MD;  Location: [place] [facility];  Service: Cardiovascular;  Laterality: N/A;  . REMOVAL OF A DIALYSIS CATHETER N/A 06/30/2017   Procedure: REMOVAL OF A DIALYSIS CATHETER;  Surgeon: Katha Cabal, MD;  Location: [facility];  Service: Vascular;  Laterality: N/A;  . REPLACEMENT  TOTAL KNEE BILATERAL Bilateral     SOCIAL HISTORY:   Social History   Tobacco Use  . Smoking status: Current Every Day Smoker    Packs/day: 0.25    Types: Cigarettes  . Smokeless tobacco: Never Used  Substance Use Topics  . Alcohol use: No    Alcohol/week: 0.0 oz    FAMILY HISTORY:   Family History  Problem Relation Age of Onset  . Cancer Mother   . Cancer Father   . Diabetes Brother   . Heart disease Brother     DRUG ALLERGIES:   Allergies  Allergen Reactions  . Nystatin Hives and Itching  . Sulfa Antibiotics Swelling, Hives and Rash  . Niaspan [Niacin] Other (See Comments) and Hives    REVIEW OF SYSTEMS:   Review of Systems  Constitutional: Negative for chills, diaphoresis, fever, malaise/fatigue and weight loss.  HENT: Negative for congestion, ear pain, hearing loss, nosebleeds, sinus pain, sore throat and tinnitus.   Eyes: Negative for blurred vision, double vision, photophobia, pain, discharge and redness.  Respiratory: Positive for shortness of breath. Negative for cough, hemoptysis, sputum production and wheezing.   Cardiovascular: Positive for chest pain. Negative for palpitations, orthopnea, claudication, leg swelling and PND.  Gastrointestinal: Negative for abdominal pain, blood in stool, constipation, diarrhea, heartburn, melena, nausea and vomiting.  Genitourinary: Negative for dysuria, frequency, hematuria and urgency.  Musculoskeletal: Negative for back pain, falls, joint pain, myalgias and neck pain.  Skin: Negative for itching and rash.  Neurological: Negative for dizziness, tingling, tremors, sensory change, speech change, focal weakness, seizures, loss of consciousness, weakness and headaches.  Psychiatric/Behavioral: Negative for memory loss. The patient does not have insomnia.    MEDICATIONS AT HOME:   Prior to Admission medications   Medication Sig Start Date End Date Taking? Authorizing Provider  albuterol (PROVENTIL HFA;VENTOLIN HFA) 108  (90 BASE) MCG/ACT inhaler Inhale 2 puffs into the lungs every 6 (six) hours as needed for wheezing or shortness of breath.    Yes [provider]  aspirin EC 81 MG tablet Take 81 mg by mouth daily.   Yes [provider]  clopidogrel (PLAVIX) 75 MG tablet Take 1 tablet by mouth daily. 01/03/18  Yes [provider]  methocarbamol (ROBAXIN) 750 MG tablet Take 750 mg by mouth 2 (two) times daily as needed for muscle spasms.    Yes [provider]  nitroGLYCERIN (NITROLINGUAL) 0.4 MG/SPRAY spray Place 2 sprays under the tongue every 5 (five) minutes x 3 doses as needed for chest pain.    Yes [provider]  Oxycodone HCl 10 MG TABS Take 10 mg by mouth every 6 (six) hours as needed.  12/28/17  Yes [provider]  pantoprazole (PROTONIX) 40 MG tablet Take 40 mg by mouth 2 (two) times daily.   Yes [provider]  RENVELA 2.4 g PACK Take 2.4 g by mouth 2 (two) times daily. 01/14/18  Yes Fritzi Mandes, MD  SENSIPAR 30 MG tablet Take 1 tablet (30 mg total) by mouth daily. 01/14/18  Yes Fritzi Mandes, MD  Riesel 18 MCG  inhalation capsule Place 1 capsule into inhaler and inhale daily.   Yes [provider]  SYMBICORT 160-4.5 MCG/ACT inhaler Inhale 2 puffs into the lungs 2 (two) times daily.   Yes [provider]  venlafaxine XR (EFFEXOR-XR) 37.5 MG 24 hr capsule Take 37.5 mg by mouth daily with breakfast.   Yes [provider]  calcium carbonate (OSCAL) 1500 (600 Ca) MG TABS tablet Take 600 mg of elemental calcium by mouth daily with breakfast.    [provider]  gabapentin (NEURONTIN) 100 MG capsule Take 100 mg by mouth at bedtime.    [provider]  ipratropium (ATROVENT) 0.02 % nebulizer solution Take 2.5 mLs (0.5 mg total) by nebulization every 6 (six) hours as needed for wheezing or shortness of breath. Patient not taking: Reported on 02/26/2018 12/24/17   Dustin Flock, MD  meclizine  (ANTIVERT) 12.5 MG tablet Take 1 tablet (12.5 mg total) by mouth 3 (three) times daily as needed for dizziness or nausea. Patient not taking: Reported on 02/26/2018 08/27/16   Daymon Larsen, MD  meloxicam (MOBIC) 15 MG tablet Take 1 tablet (15 mg total) by mouth daily. Patient not taking: Reported on 02/26/2018 11/16/17 11/16/18  Caryn Section Linden Dolin, PA-C  nicotine (NICODERM CQ - DOSED IN MG/24 HOURS) 21 mg/24hr patch Place 1 patch (21 mg total) onto the skin daily. Patient not taking: Reported on 01/11/2018 12/24/17   Dustin Flock, MD  Nutritional Supplements (FEEDING SUPPLEMENT, NEPRO CARB STEADY,) LIQD Take 237 mLs by mouth 2 (two) times daily between meals. Patient not taking: Reported on 02/26/2018 12/08/17   Fritzi Mandes, MD  rosuvastatin (CRESTOR) 20 MG tablet Take 1 tablet (20 mg total) by mouth daily. Patient not taking: Reported on 02/26/2018 01/14/18   Fritzi Mandes, MD      VITAL SIGNS:  Blood pressure (!) 158/79, pulse 79, temperature 98.1 F (36.7 C), temperature source Oral, resp. rate 18, height 5\' 6"  (1.676 m), weight 91.6 kg (202 lb), SpO2 94 %.  PHYSICAL EXAMINATION:  Physical Exam  Constitutional: She is oriented to person, place, and time. She appears well-developed and well-nourished. She is active and cooperative.  Non-toxic appearance. She does not have a sickly appearance. She does not appear ill. She appears distressed. She is not intubated. Face mask in place.  HENT:  Head: Normocephalic and atraumatic.  Mouth/Throat: Oropharynx is clear and moist. No oropharyngeal exudate.  Eyes: Conjunctivae, EOM and lids are normal. No scleral icterus.  Neck: Neck supple. No JVD present. No thyromegaly present.  Cardiovascular: Normal rate, regular rhythm, S1 normal, S2 normal and normal heart sounds.  No extrasystoles are present. Exam reveals no gallop, no S3, no S4, no distant heart sounds and no friction rub.  No murmur heard. Pulmonary/Chest: No accessory muscle usage or stridor.  Tachypnea noted. No apnea and no bradypnea. She is not intubated. She is in respiratory distress. She has decreased breath sounds in the right upper field, the right middle field, the right lower field, the left upper field, the left middle field and the left lower field. She has no wheezes. She has no rhonchi. She has rales in the right lower field and the left lower field.  Abdominal: Soft. She exhibits no distension. There is no tenderness. There is no rebound and no guarding.  Musculoskeletal: Normal range of motion. She exhibits no edema or tenderness.       Right lower leg: Normal. She exhibits no tenderness and no edema.  Left lower leg: Normal. She exhibits no tenderness and no edema.  Lymphadenopathy:    She has no cervical adenopathy.  Neurological: She is alert and oriented to person, place, and time. She is not disoriented.  Skin: Skin is warm, dry and intact. No rash noted. She is not diaphoretic. No erythema.  Psychiatric: She has a normal mood and affect. Her speech is normal and behavior is normal. Judgment and thought content normal. Her mood appears not anxious. She is not agitated. Cognition and memory are normal.   Bibasilar crackles, no leg edema. LABORATORY PANEL:   CBC Recent Labs  Lab 02/26/18 0253  WBC 6.3  HGB 9.6*  HCT 28.7*  PLT 218   ------------------------------------------------------------------------------------------------------------------  Chemistries  Recent Labs  Lab 02/26/18 0253  NA 138  K 4.1  CL 103  CO2 22  GLUCOSE 149*  BUN 41*  CREATININE 7.25*  CALCIUM 9.2   ------------------------------------------------------------------------------------------------------------------  Cardiac Enzymes Recent Labs  Lab 02/26/18 0253  TROPONINI 0.03*   ------------------------------------------------------------------------------------------------------------------  RADIOLOGY:  Dg Chest 2 View  Result Date: 02/26/2018 CLINICAL  DATA:  Chest pain wakening patient from sleep. EXAM: CHEST - 2 VIEW COMPARISON:  01/12/2018 FINDINGS: Stable cardiomegaly with moderate aortic atherosclerosis. Diffuse interstitial edema is noted. No alveolar consolidation, effusion or pneumothorax. No acute osseous abnormality. IMPRESSION: Stable cardiomegaly with aortic atherosclerosis. Diffuse interstitial edema noted. Electronically Signed   By: Ashley Royalty M.D.   On: 02/26/2018 03:35   IMPRESSION AND PLAN:   A/P: 28F ESRD p/w CP, SOB/pulmonary edema/volume overload, acute hypoxemic respiratory failure, ESRD (TTS), hyperglycemia. -Tele, continuous cardiac monitoring -Trop-I 0.03, 2x rpt pending -EXB28 -c/w Plavix, Statin -Not on beta blocker, ACE-I/ARB -Morphine, NTG PRN -CP likely 2/2 volume overload/pulmonary edema, but pt nonetheless at high risk for ACS -Pulse ox -Will likely need extra HD, nephrology consult -SSI -c/w home meds -Renal diabetic free water restricted diet -Heparin -Full code -Admission, > 2 midnights   All the records are reviewed and case discussed with ED provider. Management plans discussed with the patient, family and they are in agreement.  CODE STATUS: Full code  TOTAL TIME TAKING CARE OF THIS PATIENT: 90 minutes.    Arta Silence M.D on 02/26/2018 at 6:31 AM  Between 7am to 6pm - Pager - [phone]  After 6pm go to [email or web address] - Proofreader  [facility]  Office  [phone]  CC: Primary care physician; [facility]   Note: This dictation was prepared with Dragon dictation along with smaller phrase technology. Any transcriptional errors that result from this process are unintentional.

## 2018-02-26 NOTE — Progress Notes (Signed)
CRITICAL VALUE ALERT  Critical Value:  0.50   Date & Time Notied:  02/26/2018, 12:26  Provider Notified: Text page sent to [place]   Orders Received/Actions taken: pt in dialysis at this time, cardiac consult ordered,

## 2018-02-26 NOTE — Progress Notes (Signed)
Pre HD assessment    02/26/18 1027  Vital Signs  Temp (!) 97.5 F (36.4 C)  Temp Source Oral  Pulse Rate 62  Pulse Rate Source Monitor  Resp (!) 21  BP 140/69  BP Location Right Arm  BP Method Automatic  Patient Position (if appropriate) Lying  Oxygen Therapy  SpO2 100 %  O2 Device Nasal Cannula  O2 Flow Rate (L/min) 2 L/min  Pain Assessment  Pain Scale 0-10  Pain Score 0  Dialysis Weight  Weight 106.1 kg (233 lb 14.5 oz)  Type of Weight Pre-Dialysis  Time-Out for Hemodialysis  What Procedure? HD  Pt Identifiers(min of two) First/Last Name;MRN/Account#  Correct Site? Yes  Correct Side? Yes  Correct Procedure? Yes  Consents Verified? Yes  Rad Studies Available? N/A  Safety Precautions Reviewed? Yes  Engineer, civil (consulting) Number  ([id number])  Station Number 4  UF/Alarm Test Passed  Conductivity: Meter 13.8  Conductivity: Machine  14.1  pH 7.4  Reverse Osmosis main  Normal Saline Lot Number [id number]  Dialyzer Lot Number [id number]  Disposable Set Lot Number [id number]  Machine Temperature 98.6 F (37 C)  Musician and Audible Yes  Blood Lines Intact and Secured Yes  Pre Treatment Patient Checks  Vascular access used during treatment Fistula  Hepatitis B Surface Antigen Results Negative  Date Hepatitis B Surface Antigen Drawn 01/11/18  Hepatitis B Surface Antibody  (>10)  Date Hepatitis B Surface Antibody Drawn 01/11/18  Hemodialysis Consent Verified Yes  Hemodialysis Standing Orders Initiated Yes  ECG (Telemetry) Monitor On Yes  Prime Ordered Normal Saline  Length of  DialysisTreatment -hour(s) 3 Hour(s)  Dialyzer Elisio 17H NR  Dialysate 3K, 2.5 Ca  Dialysis Anticoagulant None  Dialysate Flow Ordered 800  Blood Flow Rate Ordered 400 mL/min  Ultrafiltration Goal 2 Liters  Dialysis Blood Pressure Support Ordered Normal Saline  Education / Care Plan  Dialysis Education Provided Yes  Documented Education in Care Plan Yes

## 2018-02-26 NOTE — ED Triage Notes (Signed)
Pt arrives via EMS from home. Pt reports CP awoke her from sleep about 1 hour ago. Pain started in the left arm and moved to her left chest, episode of n/v. Pt was given ASA 324 en route and 4mg  IM zofran. VSS. Dailysis pt T/TH/Sat. Reports has had cough 2-3 weeks.

## 2018-02-26 NOTE — Progress Notes (Signed)
HD tx end   02/26/18 1342  Vital Signs  Pulse Rate 64  Pulse Rate Source Monitor  Resp 17  BP (!) 149/73  BP Location Right Arm  BP Method Automatic  Patient Position (if appropriate) Lying  Oxygen Therapy  SpO2 99 %  O2 Device Nasal Cannula  O2 Flow Rate (L/min) 2 L/min  During Hemodialysis Assessment  Dialysis Fluid Bolus Normal Saline  Bolus Amount (mL) 250 mL  Intra-Hemodialysis Comments Tx completed

## 2018-02-26 NOTE — Progress Notes (Signed)
Post HD assessment    02/26/18 1352  Neurological  Level of Consciousness Alert  Orientation Level Oriented X4  Respiratory  Respiratory Pattern Regular;Unlabored  Chest Assessment Chest expansion symmetrical  Cardiac  ECG Monitor Yes  Antiarrhythmic device No  Vascular  R Radial Pulse +2  L Radial Pulse +2  Integumentary  Integumentary (WDL) X  Skin Color Appropriate for ethnicity  Musculoskeletal  Musculoskeletal (WDL) X  Generalized Weakness Yes  Assistive Device None  GU Assessment  Genitourinary (WDL) X  Genitourinary Symptoms  (HD)  Psychosocial  Psychosocial (WDL) WDL

## 2018-02-26 NOTE — Progress Notes (Signed)
Pre HD assessment    02/26/18 1028  Neurological  Level of Consciousness Alert  Orientation Level Oriented X4  Respiratory  Respiratory Pattern Regular;Unlabored  Chest Assessment Chest expansion symmetrical  Bilateral Breath Sounds Diminished;Fine crackles  Cardiac  ECG Monitor Yes  Antiarrhythmic device No  Vascular  R Radial Pulse +2  L Radial Pulse +2  Integumentary  Integumentary (WDL) X  Skin Color Appropriate for ethnicity  Musculoskeletal  Musculoskeletal (WDL) X  Generalized Weakness Yes  Assistive Device None  GU Assessment  Genitourinary (WDL) X  Genitourinary Symptoms  (HD)  Psychosocial  Psychosocial (WDL) WDL  Emotional support given Given to patient

## 2018-02-26 NOTE — ED Notes (Signed)
Pt placed on 2L O2 via [place] upon arrival d/t [facility] and RA sat in 80s. Maintains 94-100% on 2Liters. No oxygen therapy at home.

## 2018-02-26 NOTE — ED Provider Notes (Signed)
[facility] Emergency Department Provider Note   ____________________________________________   First MD Initiated Contact with Patient 02/26/18 0244     (approximate)  I have reviewed the triage vital signs and the nursing notes.   HISTORY  Chief Complaint Chest Pain    HPI Kimberly Shaffer is a 67 y.o. female who comes into the hospital today with some chest pain.  The patient woke up about an hour prior to her arrival with pain down her left arm and in her chest.  The pain was also going into her left neck.  The patient has a history of end-stage renal disease on dialysis.  She gets dialysis on Tuesdays Thursdays and Saturdays and she has not missed any.  She did have some nausea and vomited once.  The patient rates her pain a 10 out of 10 in intensity.  She was given aspirin and Zofran by EMS.  The patient has had heart attack in the past.  She is here today for evaluation of her symptoms.   Past Medical History:  Diagnosis Date  . Asthma   . CAD (coronary artery disease)   . Chronic lower back pain   . COPD (chronic obstructive pulmonary disease) ([place])   . Depression   . Diabetes mellitus without complication (HCC)    NIDD  . H/O angina pectoris   . H/O blood clots   . Hypercholesteremia   . Hypertension   . Left rotator cuff tear   . Myocardial infarction ([place])   . Obesity   . Renal insufficiency   . Stroke ([place])   . Vertigo, aural     Patient Active Problem List   Diagnosis Date Noted  . Respiratory failure ([place]) 01/11/2018  . Abdominal pain 06/28/2017  . Bilateral carotid artery stenosis 03/08/2017  . ESRD needing dialysis ([place]) 03/08/2017  . Non-ST elevation myocardial infarction (NSTEMI), subendocardial infarction, subsequent episode of care ([place]) 11/04/2016  . Onychogryphosis 10/31/2016  . Onychomycosis 10/31/2016  . Subungual exostosis 10/31/2016  . Type 2 diabetes mellitus with diabetic neuropathy ([place]) 10/31/2016  . Chronic  osteomyelitis of left hand ([place]) 09/03/2016  . Left arm swelling 08/30/2016  . Pre-operative clearance 08/30/2016  . Coronary artery disease of native artery of native heart with stable angina pectoris ([place]) 08/03/2016  . Benign paroxysmal positional vertigo due to bilateral vestibular disorder 07/26/2016  . H/O: CVA (cerebrovascular accident) 07/26/2016  . Stable angina ([place]) 07/05/2016  . Sepsis due to pneumonia ([place]) 07/05/2016  . Fracture of left foot 04/26/2016  . Pre-transplant evaluation for kidney transplant 02/04/2016  . Chest pain at rest 12/17/2015  . Chest pain 12/17/2015  . DJD of shoulder 10/21/2015  . Perianal lesion 05/20/2015  . 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 Lumbar radiculopathy 05/14/2015  . 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 Benign essential hypertension 04/22/2015  . Grief 03/18/2015  . Lumbosacral facet joint syndrome 02/16/2015  . Greater trochanteric bursitis 02/16/2015  . DDD (degenerative disc disease), lumbosacral 01/20/2015  . 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 Osteoarthritis 01/20/2015  . IDA (iron deficiency anemia) 03/25/2014  . Thyroid nodule 01/15/2014  . Lung nodule 08/28/2013  . Tobacco abuse 05/17/2013  . Mixed hyperlipidemia 06/14/2011  . OSA on CPAP 06/14/2011  . PAD (peripheral artery disease) ([place]) 06/14/2011  . Depression 05/05/2011  . Chronic kidney disease (CKD), stage V ([place]) 03/26/2011  . Multiple vessel coronary artery disease 03/26/2011  . Obesity, unspecified 03/26/2011  .  Type 2 diabetes mellitus ([place]) 03/26/2011    Past Surgical History:  Procedure Laterality Date  . A/V FISTULAGRAM Left 03/14/2017   Procedure: A/V Fistulagram;  Surgeon: Katha Cabal, MD;  Location: [place] [facility];  Service: Cardiovascular;  Laterality: Left;  . AV FISTULA PLACEMENT Left 11-04-2015  . AV FISTULA PLACEMENT Left 11/04/2015   Procedure: ARTERIOVENOUS (AV) FISTULA CREATION   ( BRACHIAL CEPHALIC );  Surgeon: Katha Cabal, MD;  Location: [facility];  Service: Vascular;  Laterality: Left;  . CARDIAC CATHETERIZATION    . CARDIAC CATHETERIZATION Left 07/19/2016   Procedure: Left Heart Cath and Coronary Angiography;  Surgeon: Corey Skains, MD;  Location: [place] [facility];  Service: Cardiovascular;  Laterality: Left;  . CORONARY ARTERY BYPASS GRAFT  2012  . CORONARY STENT PLACEMENT    . JOINT REPLACEMENT    . KNEE SURGERY Bilateral   . LEFT HEART CATH AND CORONARY ANGIOGRAPHY N/A 12/25/2017   Procedure: LEFT HEART CATH AND CORONARY ANGIOGRAPHY;  Surgeon: Teodoro Spray, MD;  Location: [place] [facility];  Service: Cardiovascular;  Laterality: N/A;  . PERIPHERAL VASCULAR CATHETERIZATION N/A 11/13/2015   Procedure: Dialysis/Perma Catheter Insertion;  Surgeon: Katha Cabal, MD;  Location: [place] [facility];  Service: Cardiovascular;  Laterality: N/A;  . PERIPHERAL VASCULAR CATHETERIZATION N/A 12/04/2015   Procedure: Dialysis/Perma Catheter Insertion;  Surgeon: Katha Cabal, MD;  Location: [place] [facility];  Service: Cardiovascular;  Laterality: N/A;  . PERIPHERAL VASCULAR CATHETERIZATION Left 12/31/2015   Procedure: A/V Shuntogram/Fistulagram;  Surgeon: Algernon Huxley, MD;  Location: [place] [facility];  Service: Cardiovascular;  Laterality: Left;  . PERIPHERAL VASCULAR CATHETERIZATION N/A 12/31/2015   Procedure: A/V Shunt Intervention;  Surgeon: Algernon Huxley, MD;  Location: [place] [facility];  Service: Cardiovascular;  Laterality: N/A;  . PERIPHERAL VASCULAR CATHETERIZATION Left 02/25/2016   Procedure: A/V Shuntogram/Fistulagram;  Surgeon: Algernon Huxley, MD;  Location: [place] [facility];  Service: Cardiovascular;  Laterality: Left;  . PERIPHERAL VASCULAR CATHETERIZATION N/A 03/18/2016   Procedure: Dialysis/Perma Catheter Removal;  Surgeon: Katha Cabal, MD;  Location: [place] [facility];  Service: Cardiovascular;  Laterality: N/A;  . REMOVAL  OF A DIALYSIS CATHETER N/A 06/30/2017   Procedure: REMOVAL OF A DIALYSIS CATHETER;  Surgeon: Katha Cabal, MD;  Location: [facility];  Service: Vascular;  Laterality: N/A;  . REPLACEMENT TOTAL KNEE BILATERAL Bilateral     Prior to Admission medications   Medication Sig Start Date End Date Taking? Authorizing Provider  albuterol (PROVENTIL HFA;VENTOLIN HFA) 108 (90 BASE) MCG/ACT inhaler Inhale 2 puffs into the lungs every 6 (six) hours as needed for wheezing or shortness of breath.    Yes [provider]  aspirin EC 81 MG tablet Take 81 mg by mouth daily.   Yes [provider]  clopidogrel (PLAVIX) 75 MG tablet Take 1 tablet by mouth daily. 01/03/18  Yes [provider]  methocarbamol (ROBAXIN) 750 MG tablet Take 750 mg by mouth 2 (two) times daily as needed for muscle spasms.    Yes [provider]  nitroGLYCERIN (NITROLINGUAL) 0.4 MG/SPRAY spray Place 2 sprays under the tongue every 5 (five) minutes x 3 doses as needed for chest pain.    Yes [provider]  Oxycodone HCl 10 MG TABS Take 10 mg by mouth every 6 (six) hours as needed.  12/28/17  Yes [provider]  pantoprazole (PROTONIX) 40 MG tablet Take 40 mg by mouth 2 (  two) times daily.   Yes [provider]  RENVELA 2.4 g PACK Take 2.4 g by mouth 2 (two) times daily. 01/14/18  Yes Fritzi Mandes, MD  SENSIPAR 30 MG tablet Take 1 tablet (30 mg total) by mouth daily. 01/14/18  Yes Fritzi Mandes, MD  SPIRIVA HANDIHALER 18 MCG inhalation capsule Place 1 capsule into inhaler and inhale daily.   Yes [provider]  SYMBICORT 160-4.5 MCG/ACT inhaler Inhale 2 puffs into the lungs 2 (two) times daily.   Yes [provider]  venlafaxine XR (EFFEXOR-XR) 37.5 MG 24 hr capsule Take 37.5 mg by mouth daily with breakfast.   Yes [provider]  calcium carbonate (OSCAL) 1500 (600 Ca) MG TABS tablet Take 600 mg of elemental calcium by mouth daily with breakfast.     [provider]  gabapentin (NEURONTIN) 100 MG capsule Take 100 mg by mouth at bedtime.    [provider]  ipratropium (ATROVENT) 0.02 % nebulizer solution Take 2.5 mLs (0.5 mg total) by nebulization every 6 (six) hours as needed for wheezing or shortness of breath. Patient not taking: Reported on 02/26/2018 12/24/17   Dustin Flock, MD  meclizine (ANTIVERT) 12.5 MG tablet Take 1 tablet (12.5 mg total) by mouth 3 (three) times daily as needed for dizziness or nausea. Patient not taking: Reported on 02/26/2018 08/27/16   Daymon Larsen, MD  meloxicam (MOBIC) 15 MG tablet Take 1 tablet (15 mg total) by mouth daily. Patient not taking: Reported on 02/26/2018 11/16/17 11/16/18  Caryn Section Linden Dolin, PA-C  nicotine (NICODERM CQ - DOSED IN MG/24 HOURS) 21 mg/24hr patch Place 1 patch (21 mg total) onto the skin daily. Patient not taking: Reported on 01/11/2018 12/24/17   Dustin Flock, MD  Nutritional Supplements (FEEDING SUPPLEMENT, NEPRO CARB STEADY,) LIQD Take 237 mLs by mouth 2 (two) times daily between meals. Patient not taking: Reported on 02/26/2018 12/08/17   Fritzi Mandes, MD  rosuvastatin (CRESTOR) 20 MG tablet Take 1 tablet (20 mg total) by mouth daily. Patient not taking: Reported on 02/26/2018 01/14/18   Fritzi Mandes, MD    Allergies Nystatin; Sulfa antibiotics; and Niaspan [niacin]  Family History  Problem Relation Age of Onset  . Cancer Mother   . Cancer Father   . Diabetes Brother   . Heart disease Brother     Social History Social History   Tobacco Use  . Smoking status: Current Every Day Smoker    Packs/day: 0.25    Types: Cigarettes  . Smokeless tobacco: Never Used  Substance Use Topics  . Alcohol use: No    Alcohol/week: 0.0 oz  . Drug use: No    Review of Systems  Constitutional: No fever/chills Eyes: No visual changes. ENT: No sore throat. Cardiovascular:  chest pain. Respiratory: shortness of breath. Gastrointestinal: No abdominal pain.  No nausea, no  vomiting.  No diarrhea.  No constipation. Genitourinary: Negative for dysuria. Musculoskeletal: Negative for back pain. Skin: Negative for rash. Neurological: Negative for headaches, focal weakness or numbness.   ____________________________________________   PHYSICAL EXAM:  VITAL SIGNS: ED Triage Vitals  Enc Vitals Group     BP 02/26/18 0254 (!) 156/66     Pulse Rate 02/26/18 0248 85     Resp 02/26/18 0248 18     Temp 02/26/18 0248 98.1 F (36.7 C)     Temp Source 02/26/18 0248 Oral     SpO2 02/26/18 0248 (!) 89 %     Weight 02/26/18 0246 202 lb (91.6 kg)  Height 02/26/18 0246 5\' 6"  (1.676 m)     Head Circumference --      Peak Flow --      Pain Score 02/26/18 0245 10     Pain Loc --      Pain Edu? --      Excl. in [place]? --     Constitutional: Alert and oriented. Well appearing and in moderate distress. Eyes: Conjunctivae are normal. PERRL. EOMI. Head: Atraumatic. Nose: No congestion/rhinnorhea. Mouth/Throat: Mucous membranes are moist.  Oropharynx non-erythematous. Cardiovascular: Normal rate, regular rhythm. Grossly normal heart sounds.  Good peripheral circulation. Respiratory: inceased respiratory effort.  No retractions. diminished breath sounds throughout . Gastrointestinal: Soft and nontender. No distention. Positive bowel sounds Musculoskeletal: No lower extremity tenderness nor edema.   Neurologic:  Normal speech and language.  Skin:  Skin is warm, dry and intact.  Psychiatric: Mood and affect are normal.   ____________________________________________   LABS (all labs ordered are listed, but only abnormal results are displayed)  Labs Reviewed  BASIC METABOLIC PANEL - Abnormal; Notable for the following components:      Result Value   Glucose, Bld 149 (*)    BUN 41 (*)    Creatinine, Ser 7.25 (*)    GFR calc non Af Amer 5 (*)    GFR calc Af Amer 6 (*)    All other components within normal limits  CBC - Abnormal; Notable for the following  components:   RBC 2.99 (*)    Hemoglobin 9.6 (*)    HCT 28.7 (*)    RDW 17.3 (*)    All other components within normal limits  TROPONIN I - Abnormal; Notable for the following components:   Troponin I 0.03 (*)    All other components within normal limits   ____________________________________________  EKG  ED ECG REPORT I, Loney Hering, the attending physician, personally viewed and interpreted this ECG.   Date: 02/26/2018  EKG Time: 0247  Rate: 87  Rhythm: normal sinus rhythm  Axis: normal  Intervals:prolonged qtc  ST&T Change: ST elevation in aVR seen on EKG from April 2019, flipped T waves in lead I and aVL also seen on previous EKG, ST depressions in lead II, III, aVF as well as V4 and V5.  ____________________________________________  RADIOLOGY  ED MD interpretation:  CXR: Diffuse interstitial edema  Official radiology report(s): Dg Chest 2 View  Result Date: 02/26/2018 CLINICAL DATA:  Chest pain wakening patient from sleep. EXAM: CHEST - 2 VIEW COMPARISON:  01/12/2018 FINDINGS: Stable cardiomegaly with moderate aortic atherosclerosis. Diffuse interstitial edema is noted. No alveolar consolidation, effusion or pneumothorax. No acute osseous abnormality. IMPRESSION: Stable cardiomegaly with aortic atherosclerosis. Diffuse interstitial edema noted. Electronically Signed   By: Ashley Royalty M.D.   On: 02/26/2018 03:35    ____________________________________________   PROCEDURES  Procedure(s) performed: None  Procedures  Critical Care performed: No  ____________________________________________   INITIAL IMPRESSION / ASSESSMENT AND PLAN / ED COURSE  As part of my medical decision making, I reviewed the following data within the electronic MEDICAL RECORD NUMBER Notes from prior ED visits and [place] Controlled Substance Database   This is a 67 year old female who comes into the hospital today with some chest pain.  The patient was hypoxic when she initially  arrived.  My differential diagnosis includes acute coronary syndrome, pneumonia, pulmonary edema  I did check some blood work to include a CBC, BMP and a troponin.  The patient's troponin was 0.03.  We did  a chest x-ray which also showed some pulmonary edema.  The patient's creatinine is elevated but she is a dialysis patient.  I did give the patient a dose of morphine and Zofran which did help her pain.  She does have some diffuse EKG changes but it is very similar to her baseline.  Although the patient's pain is improved she was hypoxic on arrival and had some improvement in her saturations on 2 L of O2.  I feel that the patient's pulmonary edema may be contributing to her chest pain.  She will be admitted to the hospitalist service for diuresis and further evaluation of her chest pain.      ____________________________________________   FINAL CLINICAL IMPRESSION(S) / ED DIAGNOSES  Final diagnoses:  Chest pain, unspecified type  Hypoxia  Acute pulmonary edema [facility])     ED Discharge Orders    None       Note:  This document was prepared using Dragon voice recognition software and may include unintentional dictation errors.    Loney Hering, MD 02/26/18 [phone]

## 2018-02-26 NOTE — Progress Notes (Signed)
Post HD assessment. Pt tolerated tx well without c/o or complication. Net UF 2008, goal met.    02/26/18 1353  Vital Signs  Temp 97.9 F (36.6 C)  Temp Source Oral  Pulse Rate 69  Pulse Rate Source Monitor  Resp (!) 21  BP 139/68  BP Location Right Arm  BP Method Automatic  Patient Position (if appropriate) Lying  Oxygen Therapy  SpO2 96 %  O2 Device Nasal Cannula  O2 Flow Rate (L/min) 2 L/min  Dialysis Weight  Weight 105.3 kg (232 lb 2.3 oz)  Type of Weight Post-Dialysis  Post-Hemodialysis Assessment  Rinseback Volume (mL) 250 mL  KECN 68.3 V  Dialyzer Clearance Lightly streaked  Duration of HD Treatment -hour(s) 3 hour(s)  Hemodialysis Intake (mL) 500 mL  UF Total -Machine (mL) 2508 mL  Net UF (mL) 2008 mL  Tolerated HD Treatment Yes  AVG/AVF Arterial Site Held (minutes) 10 minutes  AVG/AVF Venous Site Held (minutes) 10 minutes  Education / Care Plan  Dialysis Education Provided Yes  Documented Education in Care Plan Yes

## 2018-02-27 DIAGNOSIS — E8770 Fluid overload, unspecified: Secondary | ICD-10-CM | POA: Diagnosis not present

## 2018-02-27 DIAGNOSIS — R079 Chest pain, unspecified: Secondary | ICD-10-CM | POA: Diagnosis not present

## 2018-02-27 LAB — GLUCOSE, CAPILLARY
Glucose-Capillary: 92 mg/dL (ref 65–99)
Glucose-Capillary: 75 mg/dL (ref 65–99)

## 2018-02-27 LAB — HIV ANTIBODY (ROUTINE TESTING W REFLEX): HIV SCREEN 4TH GENERATION: NONREACTIVE

## 2018-02-27 MED ORDER — NEPRO/CARBSTEADY PO LIQD: 237.0000 mL | ORAL | 0 refills | Status: DC

## 2018-02-27 NOTE — Progress Notes (Signed)
   02/27/18 1230  Vital Signs  Temp 98.7 F (37.1 C)  Temp Source Oral  Pulse Rate 70  Resp 20  BP 131/70  BP Location Right Arm  BP Method Automatic  Patient Position (if appropriate) Lying  Oxygen Therapy  O2 Device Room Air  Post-Hemodialysis Assessment  Rinseback Volume (mL) 500 mL  Dialyzer Clearance Heavily streaked  Duration of HD Treatment -hour(s) 3 hour(s)  Hemodialysis Intake (mL) 500 mL  UF Total -Machine (mL) 2528 mL  Net UF (mL) 2028 mL  Tolerated HD Treatment Yes  AVG/AVF Arterial Site Held (minutes) 10 minutes  AVG/AVF Venous Site Held (minutes) 5 minutes

## 2018-02-27 NOTE — Progress Notes (Signed)
HD Tx completed    02/27/18 1221  Vital Signs  Pulse Rate 69  Resp 17  BP 140/61  BP Location Right Arm  BP Method Automatic  Patient Position (if appropriate) Lying  Oxygen Therapy  O2 Device Room Air  During Hemodialysis Assessment  HD Safety Checks Performed Yes  Dialysis Fluid Bolus Normal Saline  Bolus Amount (mL) 250 mL  Intra-Hemodialysis Comments Tx completed

## 2018-02-27 NOTE — Progress Notes (Signed)
Post HD tx. Tolerated well, UF goal met 2065m

## 2018-02-27 NOTE — Progress Notes (Signed)
BFR reduced as venous pressure above 300. MD notified.

## 2018-02-27 NOTE — Progress Notes (Signed)
Pre HD Assessment.    02/27/18 0920  Neurological  Level of Consciousness Alert  Orientation Level Oriented X4  Respiratory  Respiratory Pattern Regular  Chest Assessment Chest expansion symmetrical  Bilateral Breath Sounds Clear  Sputum Amount None  Cardiac  Pulse Regular  ECG Monitor Yes  Vascular  R Radial Pulse +2  L Radial Pulse +2  Edema Generalized  Psychosocial  Psychosocial (WDL) WDL

## 2018-02-27 NOTE — Plan of Care (Signed)
  Problem: Clinical Measurements: Goal: Ability to maintain clinical measurements within normal limits will improve Outcome: Adequate for Discharge   Problem: Clinical Measurements: Goal: Respiratory complications will improve Outcome: Adequate for Discharge   Problem: Activity: Goal: Risk for activity intolerance will decrease Outcome: Adequate for Discharge   Problem: Pain Managment: Goal: General experience of comfort will improve Outcome: Adequate for Discharge   Problem: Safety: Goal: Ability to remain free from injury will improve Outcome: Adequate for Discharge

## 2018-02-27 NOTE — Progress Notes (Signed)
Post HD assessment, pt tolerated tx well, no change from baseline.    02/27/18 1234  Neurological  Level of Consciousness Alert  Orientation Level Oriented X4  Respiratory  Respiratory Pattern Regular  Chest Assessment Chest expansion symmetrical  Bilateral Breath Sounds Clear  Sputum Amount None  Cardiac  Pulse Regular  ECG Monitor Yes  Vascular  R Radial Pulse +2  L Radial Pulse +2  Edema Generalized  Psychosocial  Psychosocial (WDL) WDL

## 2018-02-27 NOTE — Care Management (Signed)
Message sent to UR RN to see if this is a Code 44.

## 2018-02-27 NOTE — Progress Notes (Signed)
[facility], [place] 02/27/18  Subjective:   Patient is known to our practice from outpatient dialysis.  She presented for sudden onset of chest pain, left chest tightness, numbness and tightness in her left arm.    Evaluated by cardiologist. Medical management 2 L removed with HD yesterday Routine HD today   HEMODIALYSIS FLOWSHEET:  Blood Flow Rate (mL/min): 350 mL/min Arterial Pressure (mmHg): -170 mmHg Venous Pressure (mmHg): 180 mmHg Transmembrane Pressure (mmHg): 90 mmHg Ultrafiltration Rate (mL/min): 1010 mL/min Dialysate Flow Rate (mL/min): 800 ml/min Conductivity: Machine : 14.3 Conductivity: Machine : 14.3 Dialysis Fluid Bolus: Normal Saline Bolus Amount (mL): 250 mL     Objective:  Vital signs in last 24 hours:  Temp:  [97.9 F (36.6 C)-98.7 F (37.1 C)] 98.7 F (37.1 C) (06/11 0925) Pulse Rate:  [31-73] 64 (06/11 1045) Resp:  [13-25] 25 (06/11 1045) BP: (113-153)/(54-77) 147/76 (06/11 1045) SpO2:  [73 %-100 %] 99 % (06/11 0930) Weight:  [103.1 kg (227 lb 4.7 oz)-105.3 kg (232 lb 2.3 oz)] 103.1 kg (227 lb 4.7 oz) (06/11 0925)  Weight change: 14.5 kg (31 lb 14.5 oz) Filed Weights   02/26/18 1027 02/26/18 1353 02/27/18 0925  Weight: 106.1 kg (233 lb 14.5 oz) 105.3 kg (232 lb 2.3 oz) 103.1 kg (227 lb 4.7 oz)    Intake/Output:    Intake/Output Summary (Last 24 hours) at 02/27/2018 1110 Last data filed at 02/27/2018 0300 Gross per 24 hour  Intake 240 ml  Output 2358 ml  Net -2118 ml     Physical Exam: General:  No acute distress, laying in the bed  HEENT  anicteric, moist oral mucous membranes  Neck  supple   Pulm/lungs  clear b/l  CVS/Heart  regular, no rub or gallop  Abdomen:   Soft, nontender  Extremities:  No peripheral edema  Neurologic:  Alert, oriented  Skin:  No acute rashes  Access:  Left upper extremity AV fistula       Basic Metabolic Panel:  Recent Labs  Lab 02/26/18 0253  NA 138  K 4.1  CL 103   CO2 22  GLUCOSE 149*  BUN 41*  CREATININE 7.25*  CALCIUM 9.2     CBC: Recent Labs  Lab 02/26/18 0253  WBC 6.3  HGB 9.6*  HCT 28.7*  MCV 96.0  PLT 218      Lab Results  Component Value Date   HEPBSAG Negative 01/11/2018   HEPBSAB Reactive 01/11/2018      Microbiology:  Recent Results (from the past 240 hour(s))  MRSA PCR Screening     Status: None   Collection Time: 02/26/18  2:38 PM  Result Value Ref Range Status   MRSA by PCR NEGATIVE NEGATIVE Final    Comment:        The GeneXpert MRSA Assay (FDA approved for NASAL specimens only), is one component of a comprehensive MRSA colonization surveillance program. It is not intended to diagnose MRSA infection nor to guide or monitor treatment for MRSA infections. Performed at [facility], [address] [place],  [address]     Coagulation Studies: No results for input(s): LABPROT, INR in the last 72 hours.  Urinalysis: No results for input(s): COLORURINE, LABSPEC, PHURINE, GLUCOSEU, HGBUR, BILIRUBINUR, KETONESUR, PROTEINUR, UROBILINOGEN, NITRITE, LEUKOCYTESUR in the last 72 hours.  Invalid input(s): APPERANCEUR    Imaging: Dg Chest 2 View  Result Date: 02/26/2018 CLINICAL DATA:  Chest pain wakening patient from sleep. EXAM: CHEST - 2 VIEW COMPARISON:  01/12/2018 FINDINGS: Stable cardiomegaly with moderate aortic atherosclerosis. Diffuse interstitial edema is noted. No alveolar consolidation, effusion or pneumothorax. No acute osseous abnormality. IMPRESSION: Stable cardiomegaly with aortic atherosclerosis. Diffuse interstitial edema noted. Electronically Signed   By: Ashley Royalty M.D.   On: 02/26/2018 03:35     Medications:    . aspirin EC  81 mg Oral Daily  . calcium carbonate  600 mg of elemental calcium Oral Q breakfast  . Chlorhexidine Gluconate Cloth  6 each Topical Q0600  . cinacalcet  30 mg Oral Q breakfast  . clopidogrel  75 mg Oral Daily  . feeding supplement (NEPRO CARB  STEADY)  237 mL Oral BID BM  . gabapentin  100 mg Oral QHS  . heparin  5,000 Units Subcutaneous Q8H  . insulin aspart  0-15 Units Subcutaneous TID WC  . mometasone-formoterol  2 puff Inhalation BID  . nicotine  14 mg Transdermal Daily  . pantoprazole  40 mg Oral BID  . rosuvastatin  20 mg Oral q1800  . sevelamer carbonate  2.4 g Oral BID WC  . venlafaxine XR  37.5 mg Oral Q breakfast   acetaminophen, bisacodyl, guaiFENesin-dextromethorphan, HYDROcodone-acetaminophen, ipratropium-albuterol, meclizine, methocarbamol, morphine injection **OR** [DISCONTINUED]  morphine injection, nitroGLYCERIN, ondansetron (ZOFRAN) IV, senna-docusate  Assessment/ Plan:  67 y.o. African-American female with end stage renal disease on hemodialysis, coronary artery disease status post CABG, hypertension, peripheral vascular disease, diabetes mellitus type 2, hyperlipidemia, bilateral total knee replacement, PD catheter removal June 30, 2017  [facility]TTS/left AVF/ 102 kg  1.  End-stage renal disease on hemodialysis 2.  Shortness of breath, chest pain, possible COPD exacerbation, current smoker, CXR suggests fluid overload 3.  Secondary hyperparathyroidism 4.  Anemia chronic kidney disease  Plan: Patient seen during HD. Tolerating well. Lungs are clear today. Medical management recommended by cardiologist. Patient expected to be discharged today. No changes in dialysis regimen, She will follow up outpatient.        LOS: [place] 6/11/201911:10 AM  [place], [place]  Note: This note was prepared with Dragon dictation. Any transcription errors are unintentional

## 2018-02-27 NOTE — Progress Notes (Signed)
Pt discharged home today with family after dialysis, VSS, no complaints of any pain at this time.

## 2018-02-27 NOTE — Progress Notes (Signed)
Pre HD Tx    02/27/18 0925  Vital Signs  Temp 98.7 F (37.1 C)  Temp Source Oral  Pulse Rate (!) 31  Resp 14  BP 128/63  BP Location Right Arm  Oxygen Therapy  SpO2 (!) 73 %  O2 Device Room Air  Pain Assessment  Pain Scale 0-10  Pain Score 0  Dialysis Weight  Weight 103.1 kg (227 lb 4.7 oz)  Type of Weight Pre-Dialysis  Time-Out for Hemodialysis  What Procedure? HD  Pt Identifiers(min of two) First/Last Name;MRN/Account#  Correct Site? Yes  Correct Side? Yes  Correct Procedure? Yes  Consents Verified? Yes  Rad Studies Available? N/A  Safety Precautions Reviewed? Yes  [employer] Number [id number]  Station Number 1  UF/Alarm Test Passed  Conductivity: Meter 14  Conductivity: Machine  14.1  pH 7.4  Reverse Osmosis Main  Normal Saline Lot Number [id number]  Dialyzer Lot Number [id number]  Disposable Set Lot Number [id number]  Machine Temperature 98.6 F (37 C)  Musician and Audible Yes  Blood Lines Intact and Secured Yes  Pre Treatment Patient Checks  Vascular access used during treatment Fistula  Hepatitis B Surface Antigen Results Negative  Date Hepatitis B Surface Antigen Drawn 01/12/48  Hepatitis B Surface Antibody  (>10)  Date Hepatitis B Surface Antibody Drawn 01/11/18  Hemodialysis Consent Verified Yes  Hemodialysis Standing Orders Initiated Yes  ECG (Telemetry) Monitor On Yes  Prime Ordered Normal Saline  Length of  DialysisTreatment -hour(s) 3 Hour(s)  Dialysis Treatment Comments Na 140  Dialyzer Elisio 17H NR  Dialysate 3K, 2.5 Ca  Dialysis Anticoagulant None  Dialysate Flow Ordered 800  Blood Flow Rate Ordered 400 mL/min  Ultrafiltration Goal  (as tolerated, pt want to try for 2 L )  Dialysis Blood Pressure Support Ordered Normal Saline  Education / Care Plan  Dialysis Education Provided Yes  Documented Education in Care Plan Yes

## 2018-02-27 NOTE — Progress Notes (Signed)
HD Tx started  

## 2018-03-02 ENCOUNTER — Emergency Department: Payer: Medicare Other

## 2018-03-02 ENCOUNTER — Observation Stay
Admission: EM | Admit: 2018-03-02 | Discharge: 2018-03-03 | Disposition: A | Discharge status: Home / Self Care | Payer: Medicare Other | Attending: Internal Medicine | Admitting: Internal Medicine

## 2018-03-02 ENCOUNTER — Other Ambulatory Visit: Payer: Self-pay

## 2018-03-02 DIAGNOSIS — Z955 Presence of coronary angioplasty implant and graft: Secondary | ICD-10-CM | POA: Insufficient documentation

## 2018-03-02 DIAGNOSIS — Z951 Presence of aortocoronary bypass graft: Secondary | ICD-10-CM | POA: Diagnosis not present

## 2018-03-02 DIAGNOSIS — Z7902 Long term (current) use of antithrombotics/antiplatelets: Secondary | ICD-10-CM | POA: Diagnosis not present

## 2018-03-02 DIAGNOSIS — Z7951 Long term (current) use of inhaled steroids: Secondary | ICD-10-CM | POA: Diagnosis not present

## 2018-03-02 DIAGNOSIS — J449 Chronic obstructive pulmonary disease, unspecified: Secondary | ICD-10-CM | POA: Diagnosis not present

## 2018-03-02 DIAGNOSIS — Z992 Dependence on renal dialysis: Secondary | ICD-10-CM | POA: Diagnosis not present

## 2018-03-02 DIAGNOSIS — F17210 Nicotine dependence, cigarettes, uncomplicated: Secondary | ICD-10-CM | POA: Insufficient documentation

## 2018-03-02 DIAGNOSIS — Z9989 Dependence on other enabling machines and devices: Secondary | ICD-10-CM

## 2018-03-02 DIAGNOSIS — E1140 Type 2 diabetes mellitus with diabetic neuropathy, unspecified: Secondary | ICD-10-CM | POA: Diagnosis present

## 2018-03-02 DIAGNOSIS — I252 Old myocardial infarction: Secondary | ICD-10-CM | POA: Insufficient documentation

## 2018-03-02 DIAGNOSIS — N186 End stage renal disease: Secondary | ICD-10-CM | POA: Diagnosis not present

## 2018-03-02 DIAGNOSIS — Z881 Allergy status to other antibiotic agents status: Secondary | ICD-10-CM | POA: Insufficient documentation

## 2018-03-02 DIAGNOSIS — I5033 Acute on chronic diastolic (congestive) heart failure: Secondary | ICD-10-CM | POA: Diagnosis present

## 2018-03-02 DIAGNOSIS — I132 Hypertensive heart and chronic kidney disease with heart failure and with stage 5 chronic kidney disease, or end stage renal disease: Principal | ICD-10-CM | POA: Insufficient documentation

## 2018-03-02 DIAGNOSIS — N2581 Secondary hyperparathyroidism of renal origin: Secondary | ICD-10-CM | POA: Insufficient documentation

## 2018-03-02 DIAGNOSIS — Z8673 Personal history of transient ischemic attack (TIA), and cerebral infarction without residual deficits: Secondary | ICD-10-CM | POA: Insufficient documentation

## 2018-03-02 DIAGNOSIS — E1151 Type 2 diabetes mellitus with diabetic peripheral angiopathy without gangrene: Secondary | ICD-10-CM | POA: Insufficient documentation

## 2018-03-02 DIAGNOSIS — Z79899 Other long term (current) drug therapy: Secondary | ICD-10-CM | POA: Diagnosis not present

## 2018-03-02 DIAGNOSIS — Z96653 Presence of artificial knee joint, bilateral: Secondary | ICD-10-CM | POA: Diagnosis not present

## 2018-03-02 DIAGNOSIS — I10 Essential (primary) hypertension: Secondary | ICD-10-CM | POA: Diagnosis present

## 2018-03-02 DIAGNOSIS — G4733 Obstructive sleep apnea (adult) (pediatric): Secondary | ICD-10-CM | POA: Diagnosis not present

## 2018-03-02 DIAGNOSIS — E669 Obesity, unspecified: Secondary | ICD-10-CM | POA: Insufficient documentation

## 2018-03-02 DIAGNOSIS — E1122 Type 2 diabetes mellitus with diabetic chronic kidney disease: Secondary | ICD-10-CM | POA: Insufficient documentation

## 2018-03-02 DIAGNOSIS — Z882 Allergy status to sulfonamides status: Secondary | ICD-10-CM | POA: Insufficient documentation

## 2018-03-02 DIAGNOSIS — I2510 Atherosclerotic heart disease of native coronary artery without angina pectoris: Secondary | ICD-10-CM | POA: Diagnosis not present

## 2018-03-02 DIAGNOSIS — Z7982 Long term (current) use of aspirin: Secondary | ICD-10-CM | POA: Diagnosis not present

## 2018-03-02 DIAGNOSIS — K219 Gastro-esophageal reflux disease without esophagitis: Secondary | ICD-10-CM | POA: Diagnosis present

## 2018-03-02 DIAGNOSIS — E782 Mixed hyperlipidemia: Secondary | ICD-10-CM | POA: Insufficient documentation

## 2018-03-02 DIAGNOSIS — R079 Chest pain, unspecified: Secondary | ICD-10-CM | POA: Diagnosis present

## 2018-03-02 DIAGNOSIS — I25118 Atherosclerotic heart disease of native coronary artery with other forms of angina pectoris: Secondary | ICD-10-CM | POA: Diagnosis present

## 2018-03-02 LAB — CREATININE, SERUM
CREATININE: 8.25 mg/dL — AB (ref 0.44–1.00)
GFR calc Af Amer: 5 mL/min — ABNORMAL LOW (ref >60)
GFR calc non Af Amer: 4 mL/min — ABNORMAL LOW (ref >60)

## 2018-03-02 LAB — BASIC METABOLIC PANEL
Anion gap: 14 (ref 5–15)
Anion gap: 13 (ref 5–15)
BUN: 54 mg/dL — AB (ref 6–20)
BUN: 58 mg/dL — ABNORMAL HIGH (ref 6–20)
CHLORIDE: 101 mmol/L (ref 101–111)
CO2: 22 mmol/L (ref 22–32)
CO2: 21 mmol/L — AB (ref 22–32)
CREATININE: 8.23 mg/dL — AB (ref 0.44–1.00)
Calcium: 9.1 mg/dL (ref 8.9–10.3)
Calcium: 8.7 mg/dL — ABNORMAL LOW (ref 8.9–10.3)
Chloride: 102 mmol/L (ref 101–111)
Creatinine, Ser: 8.13 mg/dL — ABNORMAL HIGH (ref 0.44–1.00)
GFR calc Af Amer: 5 mL/min — ABNORMAL LOW (ref >60)
GFR calc non Af Amer: 5 mL/min — ABNORMAL LOW (ref >60)
GFR calc non Af Amer: 4 mL/min — ABNORMAL LOW (ref >60)
GFR, EST AFRICAN AMERICAN: 5 mL/min — AB (ref >60)
Glucose, Bld: 106 mg/dL — ABNORMAL HIGH (ref 65–99)
Glucose, Bld: 93 mg/dL (ref 65–99)
POTASSIUM: 4.4 mmol/L (ref 3.5–5.1)
POTASSIUM: 5 mmol/L (ref 3.5–5.1)
SODIUM: 137 mmol/L (ref 135–145)
Sodium: 136 mmol/L (ref 135–145)

## 2018-03-02 LAB — CBC
HCT: 27.4 % — ABNORMAL LOW (ref 35.0–47.0)
HEMATOCRIT: 29.2 % — AB (ref 35.0–47.0)
HEMOGLOBIN: 9.2 g/dL — AB (ref 12.0–16.0)
Hemoglobin: 9.6 g/dL — ABNORMAL LOW (ref 12.0–16.0)
MCH: 31.3 pg (ref 26.0–34.0)
MCH: 31.7 pg (ref 26.0–34.0)
MCHC: 32.8 g/dL (ref 32.0–36.0)
MCHC: 33.6 g/dL (ref 32.0–36.0)
MCV: 95.2 fL (ref 80.0–100.0)
MCV: 94.5 fL (ref 80.0–100.0)
Platelets: 191 10*3/uL (ref 150–440)
Platelets: 175 10*3/uL (ref 150–440)
RBC: 3.07 MIL/uL — ABNORMAL LOW (ref 3.80–5.20)
RBC: 2.9 MIL/uL — ABNORMAL LOW (ref 3.80–5.20)
RDW: 17.1 % — AB (ref 11.5–14.5)
RDW: 16.7 % — ABNORMAL HIGH (ref 11.5–14.5)
WBC: 7.1 10*3/uL (ref 3.6–11.0)
WBC: 7.2 10*3/uL (ref 3.6–11.0)

## 2018-03-02 LAB — GLUCOSE, CAPILLARY
GLUCOSE-CAPILLARY: 83 mg/dL (ref 65–99)
GLUCOSE-CAPILLARY: 106 mg/dL — AB (ref 65–99)

## 2018-03-02 LAB — TROPONIN I
TROPONIN I: 0.32 ng/mL — AB (ref <0.03)
Troponin I: 0.18 ng/mL (ref <0.03)
Troponin I: 0.23 ng/mL (ref <0.03)

## 2018-03-02 LAB — PHOSPHORUS: PHOSPHORUS: 7.1 mg/dL — AB (ref 2.5–4.6)

## 2018-03-02 LAB — BRAIN NATRIURETIC PEPTIDE: B Natriuretic Peptide: 592 pg/mL — ABNORMAL HIGH (ref 0.0–100.0)

## 2018-03-02 MED ORDER — CINACALCET HCL 30 MG PO TABS
30.0000 mg | ORAL_TABLET | Freq: Every day | ORAL | Status: DC
  Filled 2018-03-02 (×3): qty 1

## 2018-03-02 MED ORDER — ACETAMINOPHEN 325 MG PO TABS: 650.0000 mg | ORAL_TABLET | Freq: Four times a day (QID) | ORAL | Status: DC | PRN

## 2018-03-02 MED ORDER — CALCIUM CARBONATE ANTACID 500 MG PO CHEW
600.0000 mg | CHEWABLE_TABLET | Freq: Every day | ORAL | Status: DC
  Administered 2018-03-02 – 2018-03-03 (×2): 600 mg via ORAL
  Filled 2018-03-02 (×2): qty 3

## 2018-03-02 MED ORDER — CALCIUM CARBONATE 1500 (600 CA) MG PO TABS: 600.0000 mg | ORAL_TABLET | Freq: Every day | ORAL | Status: DC

## 2018-03-02 MED ORDER — HEPARIN SODIUM (PORCINE) 1000 UNIT/ML DIALYSIS
20.0000 [IU]/kg | INTRAMUSCULAR | Status: DC | PRN
  Filled 2018-03-02: qty 3

## 2018-03-02 MED ORDER — NITROGLYCERIN 0.4 MG SL SUBL: 0.4000 mg | SUBLINGUAL_TABLET | SUBLINGUAL | Status: DC | PRN

## 2018-03-02 MED ORDER — GABAPENTIN 100 MG PO CAPS: 100.0000 mg | ORAL_CAPSULE | Freq: Every day | ORAL | Status: DC

## 2018-03-02 MED ORDER — TIOTROPIUM BROMIDE MONOHYDRATE 18 MCG IN CAPS
1.0000 | ORAL_CAPSULE | Freq: Every day | RESPIRATORY_TRACT | Status: DC
  Administered 2018-03-02: 18 ug via RESPIRATORY_TRACT
  Filled 2018-03-02: qty 5

## 2018-03-02 MED ORDER — ROSUVASTATIN CALCIUM 10 MG PO TABS
20.0000 mg | ORAL_TABLET | Freq: Every day | ORAL | Status: DC
  Administered 2018-03-02: 20 mg via ORAL
  Filled 2018-03-02: qty 2

## 2018-03-02 MED ORDER — ONDANSETRON HCL 4 MG/2ML IJ SOLN: 4.0000 mg | Freq: Four times a day (QID) | INTRAMUSCULAR | Status: DC | PRN

## 2018-03-02 MED ORDER — ONDANSETRON HCL 4 MG PO TABS: 4.0000 mg | ORAL_TABLET | Freq: Four times a day (QID) | ORAL | Status: DC | PRN

## 2018-03-02 MED ORDER — SEVELAMER CARBONATE 2.4 G PO PACK
2.4000 g | PACK | Freq: Two times a day (BID) | ORAL | Status: DC
  Administered 2018-03-02 (×2): 2.4 g via ORAL
  Filled 2018-03-02 (×5): qty 1

## 2018-03-02 MED ORDER — INSULIN ASPART 100 UNIT/ML ~~LOC~~ SOLN: 0.0000 [IU] | Freq: Three times a day (TID) | SUBCUTANEOUS | Status: DC

## 2018-03-02 MED ORDER — HEPARIN SODIUM (PORCINE) 5000 UNIT/ML IJ SOLN
5000.0000 [IU] | Freq: Three times a day (TID) | INTRAMUSCULAR | Status: DC
  Administered 2018-03-02 – 2018-03-03 (×2): 5000 [IU] via SUBCUTANEOUS
  Filled 2018-03-02 (×2): qty 1

## 2018-03-02 MED ORDER — VENLAFAXINE HCL ER 37.5 MG PO CP24
37.5000 mg | ORAL_CAPSULE | Freq: Every day | ORAL | Status: DC
  Administered 2018-03-02 – 2018-03-03 (×2): 37.5 mg via ORAL
  Filled 2018-03-02 (×2): qty 1

## 2018-03-02 MED ORDER — ACETAMINOPHEN 650 MG RE SUPP: 650.0000 mg | Freq: Four times a day (QID) | RECTAL | Status: DC | PRN

## 2018-03-02 MED ORDER — METHOCARBAMOL 750 MG PO TABS
750.0000 mg | ORAL_TABLET | Freq: Two times a day (BID) | ORAL | Status: DC | PRN
  Filled 2018-03-02: qty 1

## 2018-03-02 MED ORDER — ASPIRIN EC 81 MG PO TBEC
81.0000 mg | DELAYED_RELEASE_TABLET | Freq: Every day | ORAL | Status: DC
  Administered 2018-03-02: 81 mg via ORAL
  Filled 2018-03-02: qty 1

## 2018-03-02 MED ORDER — OXYCODONE HCL 5 MG PO TABS
10.0000 mg | ORAL_TABLET | Freq: Four times a day (QID) | ORAL | Status: DC | PRN
  Administered 2018-03-02: 10 mg via ORAL
  Filled 2018-03-02: qty 2

## 2018-03-02 MED ORDER — INSULIN ASPART 100 UNIT/ML ~~LOC~~ SOLN: 0.0000 [IU] | Freq: Every day | SUBCUTANEOUS | Status: DC

## 2018-03-02 MED ORDER — MOMETASONE FURO-FORMOTEROL FUM 200-5 MCG/ACT IN AERO
2.0000 | INHALATION_SPRAY | Freq: Two times a day (BID) | RESPIRATORY_TRACT | Status: DC
  Administered 2018-03-02 – 2018-03-03 (×3): 2 via RESPIRATORY_TRACT
  Filled 2018-03-02: qty 8.8

## 2018-03-02 MED ORDER — ALBUTEROL SULFATE (2.5 MG/3ML) 0.083% IN NEBU: 3.0000 mL | INHALATION_SOLUTION | Freq: Four times a day (QID) | RESPIRATORY_TRACT | Status: DC | PRN

## 2018-03-02 MED ORDER — CHLORHEXIDINE GLUCONATE CLOTH 2 % EX PADS: 6.0000 | MEDICATED_PAD | Freq: Every day | CUTANEOUS | Status: DC

## 2018-03-02 MED ORDER — CLOPIDOGREL BISULFATE 75 MG PO TABS
75.0000 mg | ORAL_TABLET | Freq: Every day | ORAL | Status: DC
  Filled 2018-03-02: qty 1

## 2018-03-02 MED ORDER — NITROGLYCERIN 0.4 MG/SPRAY TL SOLN: 2.0000 | Status: DC | PRN

## 2018-03-02 MED ORDER — FUROSEMIDE 10 MG/ML IJ SOLN
40.0000 mg | Freq: Once | INTRAMUSCULAR | Status: AC
  Administered 2018-03-02: 40 mg via INTRAVENOUS
  Filled 2018-03-02: qty 4

## 2018-03-02 MED ORDER — PANTOPRAZOLE SODIUM 40 MG PO TBEC
40.0000 mg | DELAYED_RELEASE_TABLET | Freq: Two times a day (BID) | ORAL | Status: DC
  Administered 2018-03-02 (×2): 40 mg via ORAL
  Filled 2018-03-02 (×2): qty 1

## 2018-03-02 NOTE — H&P (Signed)
[facility] NAME: Kimberly Shaffer    MR#:  [id number]  DATE OF BIRTH:  10-Mar-1951  DATE OF ADMISSION:  03/02/2018  PRIMARY CARE PHYSICIAN: [facility]   REQUESTING/REFERRING PHYSICIAN: Owens Shark, MD  CHIEF COMPLAINT:   Chief Complaint  Patient presents with  . Chest Pain    HISTORY OF PRESENT ILLNESS:  Kimberly Shaffer  is a 67 y.o. female who presents with shortness of breath and chest pain.  Patient was just recently discharged from her hospital.  She returns tonight with some chest discomfort and shortness of breath and again has edema on x-ray.  Her work-up is consistent with heart failure exacerbation/fluid overload.  She is a dialysis patient as well.  Hospitalist were called for admission.  PAST MEDICAL HISTORY:   Past Medical History:  Diagnosis Date  . Asthma   . CAD (coronary artery disease)   . Chronic lower back pain   . COPD (chronic obstructive pulmonary disease) ([place])   . Depression   . Diabetes mellitus without complication (HCC)    NIDD  . H/O angina pectoris   . H/O blood clots   . Hypercholesteremia   . Hypertension   . Left rotator cuff tear   . Myocardial infarction ([place])   . Obesity   . Renal insufficiency   . Stroke ([place])   . Vertigo, aural      PAST SURGICAL HISTORY:   Past Surgical History:  Procedure Laterality Date  . A/V FISTULAGRAM Left 03/14/2017   Procedure: A/V Fistulagram;  Surgeon: Katha Cabal, MD;  Location: [place] [facility];  Service: Cardiovascular;  Laterality: Left;  . AV FISTULA PLACEMENT Left 11-04-2015  . AV FISTULA PLACEMENT Left 11/04/2015   Procedure: ARTERIOVENOUS (AV) FISTULA CREATION  ( BRACHIAL CEPHALIC );  Surgeon: Katha Cabal, MD;  Location: [facility];  Service: Vascular;  Laterality: Left;  . CARDIAC CATHETERIZATION    . CARDIAC CATHETERIZATION Left 07/19/2016   Procedure: Left Heart Cath and Coronary Angiography;  Surgeon: Corey Skains, MD;  Location: [facility] [facility];  Service: Cardiovascular;  Laterality: Left;  . CORONARY ARTERY BYPASS GRAFT  2012  . CORONARY STENT PLACEMENT    . JOINT REPLACEMENT    . KNEE SURGERY Bilateral   . LEFT HEART CATH AND CORONARY ANGIOGRAPHY N/A 12/25/2017   Procedure: LEFT HEART CATH AND CORONARY ANGIOGRAPHY;  Surgeon: Teodoro Spray, MD;  Location: [place] [facility];  Service: Cardiovascular;  Laterality: N/A;  . PERIPHERAL VASCULAR CATHETERIZATION N/A 11/13/2015   Procedure: Dialysis/Perma Catheter Insertion;  Surgeon: Katha Cabal, MD;  Location: [place] [facility];  Service: Cardiovascular;  Laterality: N/A;  . PERIPHERAL VASCULAR CATHETERIZATION N/A 12/04/2015   Procedure: Dialysis/Perma Catheter Insertion;  Surgeon: Katha Cabal, MD;  Location: [facility] [place] [facility];  Service: Cardiovascular;  Laterality: N/A;  . PERIPHERAL VASCULAR CATHETERIZATION Left 12/31/2015   Procedure: A/V Shuntogram/Fistulagram;  Surgeon: Algernon Huxley, MD;  Location: [place] [facility];  Service: Cardiovascular;  Laterality: Left;  . PERIPHERAL VASCULAR CATHETERIZATION N/A 12/31/2015   Procedure: A/V Shunt Intervention;  Surgeon: Algernon Huxley, MD;  Location: [place] [facility];  Service: Cardiovascular;  Laterality: N/A;  . PERIPHERAL VASCULAR CATHETERIZATION Left 02/25/2016   Procedure: A/V Shuntogram/Fistulagram;  Surgeon: Algernon Huxley, MD;  Location: [place] [facility];  Service: Cardiovascular;  Laterality: Left;  . PERIPHERAL VASCULAR CATHETERIZATION N/A 03/18/2016   Procedure: Dialysis/Perma Catheter  Removal;  Surgeon: Katha Cabal, MD;  Location: [place] [facility];  Service: Cardiovascular;  Laterality: N/A;  . REMOVAL OF A DIALYSIS CATHETER N/A 06/30/2017   Procedure: REMOVAL OF A DIALYSIS CATHETER;  Surgeon: Katha Cabal, MD;  Location: [facility];  Service: Vascular;  Laterality: N/A;  . REPLACEMENT TOTAL KNEE BILATERAL Bilateral      SOCIAL HISTORY:   Social  History   Tobacco Use  . Smoking status: Current Every Day Smoker    Packs/day: 0.25    Types: Cigarettes  . Smokeless tobacco: Never Used  Substance Use Topics  . Alcohol use: No    Alcohol/week: 0.0 oz     FAMILY HISTORY:   Family History  Problem Relation Age of Onset  . Cancer Mother   . Cancer Father   . Diabetes Brother   . Heart disease Brother      DRUG ALLERGIES:   Allergies  Allergen Reactions  . Nystatin Hives and Itching  . Sulfa Antibiotics Swelling, Hives and Rash  . Niaspan [Niacin] Other (See Comments) and Hives    MEDICATIONS AT HOME:   Prior to Admission medications   Medication Sig Start Date End Date Taking? Authorizing Provider  albuterol (PROVENTIL HFA;VENTOLIN HFA) 108 (90 BASE) MCG/ACT inhaler Inhale 2 puffs into the lungs every 6 (six) hours as needed for wheezing or shortness of breath.    Yes [provider]  aspirin EC 81 MG tablet Take 81 mg by mouth daily.   Yes [provider]  calcium carbonate (OSCAL) 1500 (600 Ca) MG TABS tablet Take 600 mg of elemental calcium by mouth daily with breakfast.   Yes [provider]  clopidogrel (PLAVIX) 75 MG tablet Take 1 tablet by mouth daily. 01/03/18  Yes [provider]  gabapentin (NEURONTIN) 100 MG capsule Take 100 mg by mouth at bedtime.   Yes [provider]  methocarbamol (ROBAXIN) 750 MG tablet Take 750 mg by mouth 2 (two) times daily as needed for muscle spasms.    Yes [provider]  nitroGLYCERIN (NITROLINGUAL) 0.4 MG/SPRAY spray Place 2 sprays under the tongue every 5 (five) minutes x 3 doses as needed for chest pain.    Yes [provider]  Oxycodone HCl 10 MG TABS Take 10 mg by mouth every 6 (six) hours as needed.  12/28/17  Yes [provider]  pantoprazole (PROTONIX) 40 MG tablet Take 40 mg by mouth 2 (two) times daily.   Yes [provider]  RENVELA 2.4 g PACK Take 2.4 g by mouth 2 (two) times daily. 01/14/18   Yes Fritzi Mandes, MD  SENSIPAR 30 MG tablet Take 1 tablet (30 mg total) by mouth daily. 01/14/18  Yes Fritzi Mandes, MD  SPIRIVA HANDIHALER 18 MCG inhalation capsule Place 1 capsule into inhaler and inhale daily.   Yes [provider]  SYMBICORT 160-4.5 MCG/ACT inhaler Inhale 2 puffs into the lungs 2 (two) times daily.   Yes [provider]  venlafaxine XR (EFFEXOR-XR) 37.5 MG 24 hr capsule Take 37.5 mg by mouth daily with breakfast.   Yes [provider]  ipratropium (ATROVENT) 0.02 % nebulizer solution Take 2.5 mLs (0.5 mg total) by nebulization every 6 (six) hours as needed for wheezing or shortness of breath. Patient not taking: Reported on 02/26/2018 12/24/17   Dustin Flock, MD  meclizine (ANTIVERT) 12.5 MG tablet Take 1 tablet (12.5 mg total) by mouth 3 (three) times daily as needed for dizziness or nausea. Patient not  taking: Reported on 02/26/2018 08/27/16   Daymon Larsen, MD  meloxicam (MOBIC) 15 MG tablet Take 1 tablet (15 mg total) by mouth daily. Patient not taking: Reported on 02/26/2018 11/16/17 11/16/18  Caryn Section Linden Dolin, PA-C  nicotine (NICODERM CQ - DOSED IN MG/24 HOURS) 21 mg/24hr patch Place 1 patch (21 mg total) onto the skin daily. Patient not taking: Reported on 01/11/2018 12/24/17   Dustin Flock, MD  Nutritional Supplements (FEEDING SUPPLEMENT, NEPRO CARB STEADY,) LIQD Take 237 mLs by mouth 2 (two) times daily between meals. Patient not taking: Reported on 02/26/2018 12/08/17   Fritzi Mandes, MD  Nutritional Supplements (FEEDING SUPPLEMENT, NEPRO CARB STEADY,) LIQD Take 237 mLs by mouth daily. 02/27/18   Hillary Bow, MD  rosuvastatin (CRESTOR) 20 MG tablet Take 1 tablet (20 mg total) by mouth daily. Patient not taking: Reported on 02/26/2018 01/14/18   Fritzi Mandes, MD    REVIEW OF SYSTEMS:  Review of Systems  Constitutional: Negative for chills, fever, malaise/fatigue and weight loss.  HENT: Negative for ear pain, hearing loss and tinnitus.   Eyes:  Negative for blurred vision, double vision, pain and redness.  Respiratory: Positive for shortness of breath. Negative for cough and hemoptysis.   Cardiovascular: Positive for chest pain, orthopnea and leg swelling. Negative for palpitations.  Gastrointestinal: Negative for abdominal pain, constipation, diarrhea, nausea and vomiting.  Genitourinary: Negative for dysuria, frequency and hematuria.  Musculoskeletal: Negative for back pain, joint pain and neck pain.  Skin:       No acne, rash, or lesions  Neurological: Negative for dizziness, tremors, focal weakness and weakness.  Endo/Heme/Allergies: Negative for polydipsia. Does not bruise/bleed easily.  Psychiatric/Behavioral: Negative for depression. The patient is not nervous/anxious and does not have insomnia.      VITAL SIGNS:   Vitals:   03/02/18 0107 03/02/18 0130 03/02/18 0200 03/02/18 0230  BP:  131/75 (!) 121/54 131/62  Pulse:  82 75 75  Resp:  19 (!) 24 (!) 26  SpO2:  91% 96% 98%  Weight: 101.2 kg (223 lb)     Height: 5\' 6"  (1.676 m)      Wt Readings from Last 3 Encounters:  03/02/18 101.2 kg (223 lb)  02/27/18 103.1 kg (227 lb 4.7 oz)  01/13/18 100.9 kg (222 lb 6.4 oz)    PHYSICAL EXAMINATION:  Physical Exam  Vitals reviewed. Constitutional: She is oriented to person, place, and time. She appears well-developed and well-nourished. No distress.  HENT:  Head: Normocephalic and atraumatic.  Mouth/Throat: Oropharynx is clear and moist.  Eyes: Pupils are equal, round, and reactive to light. Conjunctivae and EOM are normal. No scleral icterus.  Neck: Normal range of motion. Neck supple. No JVD present. No thyromegaly present.  Cardiovascular: Normal rate, regular rhythm and intact distal pulses. Exam reveals no gallop and no friction rub.  No murmur heard. Respiratory: Effort normal. No respiratory distress. She has no wheezes. She has rales.  GI: Soft. Bowel sounds are normal. She exhibits no distension. There is no  tenderness.  Musculoskeletal: Normal range of motion. She exhibits edema.  No arthritis, no gout  Lymphadenopathy:    She has no cervical adenopathy.  Neurological: She is alert and oriented to person, place, and time. No cranial nerve deficit.  No dysarthria, no aphasia  Skin: Skin is warm and dry. No rash noted. No erythema.  Psychiatric: She has a normal mood and affect. Her behavior is normal. Judgment and thought content normal.    LABORATORY PANEL:  CBC Recent Labs  Lab 03/02/18 0103  WBC 7.1  HGB 9.6*  HCT 29.2*  PLT 191   ------------------------------------------------------------------------------------------------------------------  Chemistries  Recent Labs  Lab 03/02/18 0103  NA 137  K 4.4  CL 101  CO2 22  GLUCOSE 106*  BUN 54*  CREATININE 8.13*  CALCIUM 9.1   ------------------------------------------------------------------------------------------------------------------  Cardiac Enzymes Recent Labs  Lab 03/02/18 0103  TROPONINI 0.18*   ------------------------------------------------------------------------------------------------------------------  RADIOLOGY:  Dg Chest Port 1 View  Result Date: 03/02/2018 CLINICAL DATA:  Chest pain and shortness of breath. EXAM: PORTABLE CHEST 1 VIEW COMPARISON:  Frontal and lateral views 02/26/2018 FINDINGS: Unchanged cardiomegaly with aortic atherosclerosis. Unchanged interstitial edema from recent exam. No new focal airspace disease. No large pleural effusion. No pneumothorax. IMPRESSION: Unchanged cardiomegaly and pulmonary edema from exam 4 days ago. No new abnormality. Electronically Signed   By: Jeb Levering M.D.   On: 03/02/2018 01:20    EKG:   Orders placed or performed during the hospital encounter of 03/02/18  . ED EKG  . ED EKG  . EKG 12-Lead  . EKG 12-Lead    IMPRESSION AND PLAN:  Principal Problem:   Acute on chronic diastolic CHF (congestive heart failure) ([place]) -patient was given a  dose of IV Lasix in the ED, she will likely need an extra dialysis session.  Continue home meds, cardiology consult.  Her CPAP tonight may also help some. Active Problems:   ESRD on dialysis [facility]) -nephrology consult, continue home meds   Benign essential hypertension -continue home medications   Coronary artery disease of native artery of native heart with stable angina pectoris ([place]) -continue home meds   OSA on CPAP -CPAP nightly   Type 2 diabetes mellitus with diabetic neuropathy (HCC) -sliding scale insulin with corresponding glucose checks   GERD (gastroesophageal reflux disease) -home dose PPI  Chart review performed and case discussed with ED provider. Labs, imaging and/or ECG reviewed by provider and discussed with patient/family. Management plans discussed with the patient and/or family.  DVT PROPHYLAXIS: SubQ heparin  GI PROPHYLAXIS: PPI  ADMISSION STATUS: Observation  CODE STATUS: Full Code Status History    Date Active Date Inactive Code Status Order ID Comments User Context   02/26/2018 0716 02/27/2018 1821 Full Code [id number]  Arta Silence, MD Inpatient   01/11/2018 2030 01/14/2018 1509 Full Code [id number]  Saundra Shelling, MD Inpatient   12/25/2017 0835 12/25/2017 1743 Full Code [id number]  Teodoro Spray, MD Inpatient   12/23/2017 0927 12/25/2017 0834 Full Code [id number]  Harrie Foreman, MD Inpatient   12/08/2017 0357 12/08/2017 2344 Full Code [id number]  Harrie Foreman, MD Inpatient   06/28/2017 0749 07/01/2017 2218 Full Code [id number]  Harrie Foreman, MD Inpatient   07/19/2016 0857 07/19/2016 1319 Full Code [id number]  Corey Skains, MD Inpatient   07/05/2016 2236 07/09/2016 1950 Full Code [id number]  Harvie Bridge, DO Inpatient   12/17/2015 0812 12/20/2015 1957 Full Code [id number]  Saundra Shelling, MD Inpatient      TOTAL TIME TAKING CARE OF THIS PATIENT: 40 minutes.   Vickey Ewbank [place] 03/02/2018, 2:47 AM  [facility]  Office   [phone]  CC: Primary care physician; [facility]  Note:  This document was prepared using Dragon voice recognition software and may include unintentional dictation errors.

## 2018-03-02 NOTE — ED Notes (Signed)
Date and time results received: 03/02/18 0202 (use smartphrase ".now" to insert current time)  Test: Troponin Critical Value: 0.18  Name of Provider Notified: Dr. Owens Shark  Orders Received? Or Actions Taken?:

## 2018-03-02 NOTE — Progress Notes (Signed)
[facility], [place] 03/02/18  Subjective:     Patient presents with sudden onset of chest pain and shortness of breath.  She was dialyzed yesterday.  She states she did not finish her treatment and had to leave about 2 hours early because of some emergency at home.  She then reports eating dinner and feeling fine but then all of a sudden she had this pain in her left shoulder and left arm.  She comes back for evaluation. Chest x-ray shows continued pulmonary edema   Objective:  Vital signs in last 24 hours:  Temp:  [98.2 F (36.8 C)-98.3 F (36.8 C)] 98.3 F (36.8 C) (06/14 0747) Pulse Rate:  [69-95] 69 (06/14 0747) Resp:  [18-26] 18 (06/14 0747) BP: (121-142)/(54-90) 135/68 (06/14 0747) SpO2:  [91 %-99 %] 98 % (06/14 0747) Weight:  [101.2 kg (223 lb)-103 kg (227 lb)] 103 kg (227 lb) (06/14 0334)  Weight change:  Filed Weights   03/02/18 0107 03/02/18 0334  Weight: 101.2 kg (223 lb) 103 kg (227 lb)    Intake/Output:    Intake/Output Summary (Last 24 hours) at 03/02/2018 1138 Last data filed at 03/02/2018 0939 Gross per 24 hour  Intake 240 ml  Output -  Net 240 ml     Physical Exam: General:  No acute distress, laying in the bed  HEENT  anicteric, moist oral mucous membranes  Neck  supple   Pulm/lungs  bilateral crackles, oxygen by nasal cannula  CVS/Heart  regular, no rub or gallop  Abdomen:   Soft, nontender  Extremities:  No peripheral edema  Neurologic:  Alert, oriented  Skin:  No acute rashes  Access:  Left upper extremity AV fistula       Basic Metabolic Panel:  Recent Labs  Lab 02/26/18 0253 03/02/18 0103 03/02/18 0430  NA 138 137 136  K 4.1 4.4 5.0  CL 103 101 102  CO2 22 22 21*  GLUCOSE 149* 106* 93  BUN 41* 54* 58*  CREATININE 7.25* 8.13* 8.23*  8.25*  CALCIUM 9.2 9.1 8.7*     CBC: Recent Labs  Lab 02/26/18 0253 03/02/18 0103 03/02/18 0430  WBC 6.3 7.1 7.2  HGB 9.6* 9.6* 9.2*  HCT 28.7* 29.2* 27.4*   MCV 96.0 95.2 94.5  PLT 218 191 175      Lab Results  Component Value Date   HEPBSAG Negative 01/11/2018   HEPBSAB Reactive 01/11/2018      Microbiology:  Recent Results (from the past 240 hour(s))  MRSA PCR Screening     Status: None   Collection Time: 02/26/18  2:38 PM  Result Value Ref Range Status   MRSA by PCR NEGATIVE NEGATIVE Final    Comment:        The GeneXpert MRSA Assay (FDA approved for NASAL specimens only), is one component of a comprehensive MRSA colonization surveillance program. It is not intended to diagnose MRSA infection nor to guide or monitor treatment for MRSA infections. Performed at [facility], [address] [place], [place] [address]     Coagulation Studies: No results for input(s): LABPROT, INR in the last 72 hours.  Urinalysis: No results for input(s): COLORURINE, LABSPEC, PHURINE, GLUCOSEU, HGBUR, BILIRUBINUR, KETONESUR, PROTEINUR, UROBILINOGEN, NITRITE, LEUKOCYTESUR in the last 72 hours.  Invalid input(s): APPERANCEUR    Imaging: Dg Chest Port 1 View  Result Date: 03/02/2018 CLINICAL DATA:  Chest pain and shortness of breath. EXAM: PORTABLE CHEST 1 VIEW COMPARISON:  Frontal and lateral views 02/26/2018  FINDINGS: Unchanged cardiomegaly with aortic atherosclerosis. Unchanged interstitial edema from recent exam. No new focal airspace disease. No large pleural effusion. No pneumothorax. IMPRESSION: Unchanged cardiomegaly and pulmonary edema from exam 4 days ago. No new abnormality. Electronically Signed   By: Jeb Levering M.D.   On: 03/02/2018 01:20     Medications:    . aspirin EC  81 mg Oral Daily  . calcium carbonate  600 mg of elemental calcium Oral Q breakfast  . Chlorhexidine Gluconate Cloth  6 each Topical Q0600  . cinacalcet  30 mg Oral Daily  . clopidogrel  75 mg Oral Daily  . gabapentin  100 mg Oral QHS  . heparin  5,000 Units Subcutaneous Q8H  . insulin aspart  0-5 Units Subcutaneous QHS  . insulin  aspart  0-9 Units Subcutaneous TID WC  . mometasone-formoterol  2 puff Inhalation BID  . pantoprazole  40 mg Oral BID  . rosuvastatin  20 mg Oral Daily  . sevelamer carbonate  2.4 g Oral BID  . tiotropium  1 capsule Inhalation Daily  . venlafaxine XR  37.5 mg Oral Q breakfast   acetaminophen **OR** acetaminophen, albuterol, methocarbamol, nitroGLYCERIN, ondansetron **OR** ondansetron (ZOFRAN) IV, oxyCODONE  Assessment/ Plan:  67 y.o. African-American female with end stage renal disease on hemodialysis, coronary artery disease status post CABG, hypertension, peripheral vascular disease, diabetes mellitus type 2, hyperlipidemia, bilateral total knee replacement, PD catheter removal June 30, 2017  [facility]TTS/left AVF/ 101.5 kg  1.  Shortness of breath,Acute pulmonary edema 2.  End-stage renal disease on hemodialysis  3.  Secondary hyperparathyroidism 4.  Anemia chronic kidney disease  Plan: Patient was recently evaluated by cardiologist with medical management was recommended.  Based on chest x-ray and clinical exam, patient does appear to be slightly fluid overload.  We will arrange for urgent hemodialysis today for volume removal and see how she responds.  Continue sevelamer for phosphorus control.  Continue home dose of cinacalcet.  We will follow.       LOS: 0 Terrall Bley Candiss Norse 6/14/201911:38 AM  [place], [place]  Note: This note was prepared with Dragon dictation. Any transcription errors are unintentional

## 2018-03-02 NOTE — Progress Notes (Signed)
HD tx start    03/02/18 1505  Vital Signs  Pulse Rate 63  Pulse Rate Source Monitor  Resp (!) 21  BP (!) 147/66  BP Location Right Arm  BP Method Automatic  Patient Position (if appropriate) Lying  Oxygen Therapy  SpO2 100 %  O2 Device Nasal Cannula  O2 Flow Rate (L/min) 2 L/min  During Hemodialysis Assessment  Blood Flow Rate (mL/min) 400 mL/min  Arterial Pressure (mmHg) -140 mmHg  Venous Pressure (mmHg) 190 mmHg  Transmembrane Pressure (mmHg) 70 mmHg  Ultrafiltration Rate (mL/min) 860 mL/min  Dialysate Flow Rate (mL/min) 800 ml/min  Conductivity: Machine  14.2  HD Safety Checks Performed Yes  Dialysis Fluid Bolus Normal Saline  Bolus Amount (mL) 250 mL  Intra-Hemodialysis Comments Tx initiated  Fistula / Graft Left Upper arm Arteriovenous fistula  No Placement Date or Time found.   Placed prior to admission: Yes  Orientation: Left  Access Location: Upper arm  Access Type: Arteriovenous fistula  Status Accessed  Needle Size 15

## 2018-03-02 NOTE — ED Provider Notes (Signed)
[facility] Emergency Department Provider Note   First MD Initiated Contact with Patient 03/02/18 0112     (approximate)  I have reviewed the triage vital signs and the nursing notes.   HISTORY  Chief Complaint Chest Pain    HPI Kimberly Shaffer is a 67 y.o. female with below list of chronic medical conditions including CAD and diastolic congestive heart failure presents to the emergency department with acute onset of chest pain and shortness of breath which began approximately 2 hours before arrival.  Patient was given nitroglycerin via EMS with improvement of pain current pain score is 3 out of 10.  Patient states however that dyspnea persist.  Patient denies any lower extremity pain or swelling.   Past Medical History:  Diagnosis Date  . Asthma   . CAD (coronary artery disease)   . Chronic lower back pain   . COPD (chronic obstructive pulmonary disease) ([place])   . Depression   . Diabetes mellitus without complication (HCC)    NIDD  . H/O angina pectoris   . H/O blood clots   . Hypercholesteremia   . Hypertension   . Left rotator cuff tear   . Myocardial infarction ([place])   . Obesity   . Renal insufficiency   . Stroke ([place])   . Vertigo, aural     Patient Active Problem List   Diagnosis Date Noted  . Respiratory failure ([place]) 01/11/2018  . Abdominal pain 06/28/2017  . Bilateral carotid artery stenosis 03/08/2017  . ESRD needing dialysis ([place]) 03/08/2017  . Non-ST elevation myocardial infarction (NSTEMI), subendocardial infarction, subsequent episode of care ([place]) 11/04/2016  . Onychogryphosis 10/31/2016  . Onychomycosis 10/31/2016  . Subungual exostosis 10/31/2016  . Type 2 diabetes mellitus with diabetic neuropathy ([place]) 10/31/2016  . Chronic osteomyelitis of left hand ([place]) 09/03/2016  . Left arm swelling 08/30/2016  . Pre-operative clearance 08/30/2016  . Coronary artery disease of native artery of native heart with stable angina  pectoris ([place]) 08/03/2016  . Benign paroxysmal positional vertigo due to bilateral vestibular disorder 07/26/2016  . H/O: CVA (cerebrovascular accident) 07/26/2016  . Stable angina ([place]) 07/05/2016  . Sepsis due to pneumonia ([place]) 07/05/2016  . Fracture of left foot 04/26/2016  . Pre-transplant evaluation for kidney transplant 02/04/2016  . Chest pain at rest 12/17/2015  . Chest pain 12/17/2015  . DJD of shoulder 10/21/2015  . Perianal lesion 05/20/2015  . 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 Lumbar radiculopathy 05/14/2015  . 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 Benign essential hypertension 04/22/2015  . Grief 03/18/2015  . Lumbosacral facet joint syndrome 02/16/2015  . Greater trochanteric bursitis 02/16/2015  . DDD (degenerative disc disease), lumbosacral 01/20/2015  . 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 Osteoarthritis 01/20/2015  . IDA (iron deficiency anemia) 03/25/2014  . Thyroid nodule 01/15/2014  . Lung nodule 08/28/2013  . Tobacco abuse 05/17/2013  . Mixed hyperlipidemia 06/14/2011  . OSA on CPAP 06/14/2011  . PAD (peripheral artery disease) ([place]) 06/14/2011  . Depression 05/05/2011  . Chronic kidney disease (CKD), stage V ([place]) 03/26/2011  . Multiple vessel coronary artery disease 03/26/2011  . Obesity, unspecified 03/26/2011  . Type 2 diabetes mellitus ([place]) 03/26/2011    Past Surgical History:  Procedure Laterality Date  . A/V FISTULAGRAM Left 03/14/2017   Procedure: A/V Fistulagram;  Surgeon: Katha Cabal, MD;  Location: [place] [facility];  Service: Cardiovascular;  Laterality: Left;  . AV FISTULA PLACEMENT  Left 11-04-2015  . AV FISTULA PLACEMENT Left 11/04/2015   Procedure: ARTERIOVENOUS (AV) FISTULA CREATION  ( BRACHIAL CEPHALIC );  Surgeon: Katha Cabal, MD;  Location: [facility];  Service: Vascular;  Laterality: Left;  . CARDIAC CATHETERIZATION    . CARDIAC CATHETERIZATION Left 07/19/2016    Procedure: Left Heart Cath and Coronary Angiography;  Surgeon: Corey Skains, MD;  Location: [place] [facility];  Service: Cardiovascular;  Laterality: Left;  . CORONARY ARTERY BYPASS GRAFT  2012  . CORONARY STENT PLACEMENT    . JOINT REPLACEMENT    . KNEE SURGERY Bilateral   . LEFT HEART CATH AND CORONARY ANGIOGRAPHY N/A 12/25/2017   Procedure: LEFT HEART CATH AND CORONARY ANGIOGRAPHY;  Surgeon: Teodoro Spray, MD;  Location: [place] [facility];  Service: Cardiovascular;  Laterality: N/A;  . PERIPHERAL VASCULAR CATHETERIZATION N/A 11/13/2015   Procedure: Dialysis/Perma Catheter Insertion;  Surgeon: Katha Cabal, MD;  Location: [place] [facility];  Service: Cardiovascular;  Laterality: N/A;  . PERIPHERAL VASCULAR CATHETERIZATION N/A 12/04/2015   Procedure: Dialysis/Perma Catheter Insertion;  Surgeon: Katha Cabal, MD;  Location: [place] [facility];  Service: Cardiovascular;  Laterality: N/A;  . PERIPHERAL VASCULAR CATHETERIZATION Left 12/31/2015   Procedure: A/V Shuntogram/Fistulagram;  Surgeon: Algernon Huxley, MD;  Location: [place] [facility];  Service: Cardiovascular;  Laterality: Left;  . PERIPHERAL VASCULAR CATHETERIZATION N/A 12/31/2015   Procedure: A/V Shunt Intervention;  Surgeon: Algernon Huxley, MD;  Location: [place] [facility];  Service: Cardiovascular;  Laterality: N/A;  . PERIPHERAL VASCULAR CATHETERIZATION Left 02/25/2016   Procedure: A/V Shuntogram/Fistulagram;  Surgeon: Algernon Huxley, MD;  Location: [place] [facility];  Service: Cardiovascular;  Laterality: Left;  . PERIPHERAL VASCULAR CATHETERIZATION N/A 03/18/2016   Procedure: Dialysis/Perma Catheter Removal;  Surgeon: Katha Cabal, MD;  Location: [place] [facility];  Service: Cardiovascular;  Laterality: N/A;  . REMOVAL OF A DIALYSIS CATHETER N/A 06/30/2017   Procedure: REMOVAL OF A DIALYSIS CATHETER;  Surgeon: Katha Cabal, MD;  Location: [facility];  Service: Vascular;  Laterality: N/A;  . REPLACEMENT  TOTAL KNEE BILATERAL Bilateral     Prior to Admission medications   Medication Sig Start Date End Date Taking? Authorizing Provider  albuterol (PROVENTIL HFA;VENTOLIN HFA) 108 (90 BASE) MCG/ACT inhaler Inhale 2 puffs into the lungs every 6 (six) hours as needed for wheezing or shortness of breath.     [provider]  aspirin EC 81 MG tablet Take 81 mg by mouth daily.    [provider]  calcium carbonate (OSCAL) 1500 (600 Ca) MG TABS tablet Take 600 mg of elemental calcium by mouth daily with breakfast.    [provider]  clopidogrel (PLAVIX) 75 MG tablet Take 1 tablet by mouth daily. 01/03/18   [provider]  gabapentin (NEURONTIN) 100 MG capsule Take 100 mg by mouth at bedtime.    [provider]  ipratropium (ATROVENT) 0.02 % nebulizer solution Take 2.5 mLs (0.5 mg total) by nebulization every 6 (six) hours as needed for wheezing or shortness of breath. Patient not taking: Reported on 02/26/2018 12/24/17   Dustin Flock, MD  meclizine (ANTIVERT) 12.5 MG tablet Take 1 tablet (12.5 mg total) by mouth 3 (three) times daily as needed for dizziness or nausea. Patient not taking: Reported on 02/26/2018 08/27/16   Daymon Larsen, MD  meloxicam (MOBIC) 15 MG tablet Take 1 tablet (15 mg total) by mouth daily. Patient not taking: Reported on 02/26/2018 11/16/17  11/16/18  Fisher, Linden Dolin, PA-C  methocarbamol (ROBAXIN) 750 MG tablet Take 750 mg by mouth 2 (two) times daily as needed for muscle spasms.     [provider]  nicotine (NICODERM CQ - DOSED IN MG/24 HOURS) 21 mg/24hr patch Place 1 patch (21 mg total) onto the skin daily. Patient not taking: Reported on 01/11/2018 12/24/17   Dustin Flock, MD  nitroGLYCERIN (NITROLINGUAL) 0.4 MG/SPRAY spray Place 2 sprays under the tongue every 5 (five) minutes x 3 doses as needed for chest pain.     [provider]  Nutritional Supplements (FEEDING SUPPLEMENT, NEPRO CARB STEADY,) LIQD Take 237 mLs by  mouth 2 (two) times daily between meals. Patient not taking: Reported on 02/26/2018 12/08/17   Fritzi Mandes, MD  Nutritional Supplements (FEEDING SUPPLEMENT, NEPRO CARB STEADY,) LIQD Take 237 mLs by mouth daily. 02/27/18   Hillary Bow, MD  Oxycodone HCl 10 MG TABS Take 10 mg by mouth every 6 (six) hours as needed.  12/28/17   [provider]  pantoprazole (PROTONIX) 40 MG tablet Take 40 mg by mouth 2 (two) times daily.    [provider]  RENVELA 2.4 g PACK Take 2.4 g by mouth 2 (two) times daily. 01/14/18   Fritzi Mandes, MD  rosuvastatin (CRESTOR) 20 MG tablet Take 1 tablet (20 mg total) by mouth daily. Patient not taking: Reported on 02/26/2018 01/14/18   Fritzi Mandes, MD  SENSIPAR 30 MG tablet Take 1 tablet (30 mg total) by mouth daily. 01/14/18   Fritzi Mandes, MD  SPIRIVA HANDIHALER 18 MCG inhalation capsule Place 1 capsule into inhaler and inhale daily.    [provider]  SYMBICORT 160-4.5 MCG/ACT inhaler Inhale 2 puffs into the lungs 2 (two) times daily.    [provider]  venlafaxine XR (EFFEXOR-XR) 37.5 MG 24 hr capsule Take 37.5 mg by mouth daily with breakfast.    [provider]    Allergies Nystatin; Sulfa antibiotics; and Niaspan [niacin]  Family History  Problem Relation Age of Onset  . Cancer Mother   . Cancer Father   . Diabetes Brother   . Heart disease Brother     Social History Social History   Tobacco Use  . Smoking status: Current Every Day Smoker    Packs/day: 0.25    Types: Cigarettes  . Smokeless tobacco: Never Used  Substance Use Topics  . Alcohol use: No    Alcohol/week: 0.0 oz  . Drug use: No    Review of Systems Constitutional: No fever/chills Eyes: No visual changes. ENT: No sore throat. Cardiovascular: Positive for chest pain. Respiratory: Positive for shortness of breath. Gastrointestinal: No abdominal pain.  No nausea, no vomiting.  No diarrhea.  No constipation. Genitourinary: Negative for  dysuria. Musculoskeletal: Negative for neck pain.  Negative for back pain. Integumentary: Negative for rash. Neurological: Negative for headaches, focal weakness or numbness.  ____________________________________________   PHYSICAL EXAM:  VITAL SIGNS: ED Triage Vitals  Enc Vitals Group     BP 03/02/18 0106 126/68     Pulse Rate 03/02/18 0106 95     Resp 03/02/18 0106 20     Temp --      Temp src --      SpO2 03/02/18 0106 95 %     Weight 03/02/18 0107 101.2 kg (223 lb)     Height 03/02/18 0107 1.676 m (5\' 6" )     Head Circumference --      Peak Flow --  Pain Score 03/02/18 0107 8     Pain Loc --      Pain Edu? --      Excl. in [place]? --     Constitutional: Alert and oriented. Well appearing and in no acute distress. Eyes: Conjunctivae are normal.  Head: Atraumatic. Mouth/Throat: Mucous membranes are moist.  Oropharynx non-erythematous. Neck: No stridor.   Cardiovascular: Normal rate, regular rhythm. Good peripheral circulation. Grossly normal heart sounds. Respiratory: Normal respiratory effort.  No retractions.  Bibasilar rales Gastrointestinal: Soft and nontender. No distention.  Musculoskeletal: No lower extremity tenderness nor edema. No gross deformities of extremities. Neurologic:  Normal speech and language. No gross focal neurologic deficits are appreciated.  Skin:  Skin is warm, dry and intact. No rash noted. Psychiatric: Mood and affect are normal. Speech and behavior are normal.  ____________________________________________   LABS (all labs ordered are listed, but only abnormal results are displayed)  Labs Reviewed  BASIC METABOLIC PANEL - Abnormal; Notable for the following components:      Result Value   Glucose, Bld 106 (*)    BUN 54 (*)    Creatinine, Ser 8.13 (*)    GFR calc non Af Amer 5 (*)    GFR calc Af Amer 5 (*)    All other components within normal limits  CBC - Abnormal; Notable for the following components:   RBC 3.07 (*)     Hemoglobin 9.6 (*)    HCT 29.2 (*)    RDW 17.1 (*)    All other components within normal limits  TROPONIN I - Abnormal; Notable for the following components:   Troponin I 0.18 (*)    All other components within normal limits   ____________________________________________  EKG  ED ECG REPORT I, [place] N Trenae Brunke, the attending physician, personally viewed and interpreted this ECG.   Date: 03/02/2018  EKG Time: 1:09 AM  Rate: 89  Rhythm: Normal sinus rhythm  Axis: Normal  Intervals: Normal  ST&T Change: Inferior lateral ST segment depression  ____________________________________________  RADIOLOGY I,  N Ramil [place], personally viewed and evaluated these images (plain radiographs) as part of my medical decision making, as well as reviewing the written report by the radiologist.  ED MD interpretation: Unchanged cardiomegaly and pulmonary edema as compared to previous film  Official radiology report(s): Dg Chest Port 1 View  Result Date: 03/02/2018 CLINICAL DATA:  Chest pain and shortness of breath. EXAM: PORTABLE CHEST 1 VIEW COMPARISON:  Frontal and lateral views 02/26/2018 FINDINGS: Unchanged cardiomegaly with aortic atherosclerosis. Unchanged interstitial edema from recent exam. No new focal airspace disease. No large pleural effusion. No pneumothorax. IMPRESSION: Unchanged cardiomegaly and pulmonary edema from exam 4 days ago. No new abnormality. Electronically Signed   By: Jeb Levering M.D.   On: 03/02/2018 01:20      Procedures   ____________________________________________   INITIAL IMPRESSION / ASSESSMENT AND PLAN / ED COURSE  As part of my medical decision making, I reviewed the following data within the electronic MEDICAL RECORD NUMBER  67 year old female presenting to the emergency department above-stated history and physical exam concerning for CAD/ACS/CHF exacerbation.  Laboratory data notable for troponin of 0.18 however is decreased since the previous  troponin which was 0.50.  Chest x-ray revealed unchanged pulmonary edema in a patient with oxygen saturation of 88% on my arrival to the room.  Suspect CHF exacerbation as the etiology for the patient's symptoms.  Patient discussed with Dr. Jannifer Franklin for hospital admission for further management.    ____________________________________________  FINAL CLINICAL IMPRESSION(S) / ED DIAGNOSES  Final diagnoses:  Acute on chronic diastolic (congestive) heart failure (HCC)     MEDICATIONS GIVEN DURING THIS VISIT:  Medications - No data to display   ED Discharge Orders    None       Note:  This document was prepared using Dragon voice recognition software and may include unintentional dictation errors.    Gregor Hams, MD 03/02/18 [phone]

## 2018-03-02 NOTE — ED Triage Notes (Addendum)
Pt brought in by [facility] for cp and shob that started 3 hrs prior to coming in. Pt was seen here 4 days ago for the same, hx of CAD. Pt is a dialysis pt had treatment Thursday AM. ASA 324 mg po and Nitro x 1 given by EMS with states did help cp down to 8/10.

## 2018-03-02 NOTE — Progress Notes (Signed)
Post HD assessment    03/02/18 1851  Neurological  Level of Consciousness Alert  Orientation Level Oriented X4  Respiratory  Respiratory Pattern Regular;Unlabored  Chest Assessment Chest expansion symmetrical  Cardiac  ECG Monitor Yes  Antiarrhythmic device No  Vascular  R Radial Pulse +2  L Radial Pulse +2  Edema Right lower extremity;Left lower extremity  Integumentary  Integumentary (WDL) X  Skin Color Appropriate for ethnicity  Musculoskeletal  Musculoskeletal (WDL) X  Generalized Weakness Yes  Assistive Device None  GU Assessment  Genitourinary (WDL) X  Genitourinary Symptoms  (HD)  Psychosocial  Psychosocial (WDL) WDL

## 2018-03-02 NOTE — Progress Notes (Signed)
HD tx end    03/02/18 1843  Vital Signs  Pulse Rate 67  Pulse Rate Source Monitor  Resp (!) 23  BP (!) 166/79  BP Location Right Arm  BP Method Automatic  Patient Position (if appropriate) Lying  Oxygen Therapy  SpO2 100 %  O2 Device Nasal Cannula  O2 Flow Rate (L/min) 2 L/min  During Hemodialysis Assessment  Dialysis Fluid Bolus Normal Saline  Bolus Amount (mL) 250 mL  Intra-Hemodialysis Comments Tx completed

## 2018-03-02 NOTE — ED Notes (Signed)
Placed pt on 2L oxygen for comfort. Dr. Owens Shark at bedside

## 2018-03-02 NOTE — Progress Notes (Signed)
Post HD assessment. Pt tolerated tx well without c/o or complication. Net UF 2521, goal met.    03/02/18 1852  Vital Signs  Temp 98.1 F (36.7 C)  Temp Source Oral  Pulse Rate 66  Pulse Rate Source Monitor  Resp (!) 24  BP (!) 153/75  BP Location Right Arm  BP Method Automatic  Patient Position (if appropriate) Lying  Oxygen Therapy  SpO2 99 %  O2 Device Nasal Cannula  O2 Flow Rate (L/min) 2 L/min  Dialysis Weight  Weight 102.9 kg (226 lb 13.7 oz)  Type of Weight Post-Dialysis  Post-Hemodialysis Assessment  Rinseback Volume (mL) 250 mL  KECN 78 V  Dialyzer Clearance Lightly streaked  Duration of HD Treatment -hour(s) 3.5 hour(s)  Hemodialysis Intake (mL) 500 mL  UF Total -Machine (mL) 3021 mL  Net UF (mL) 2521 mL  Tolerated HD Treatment Yes  AVG/AVF Arterial Site Held (minutes) 10 minutes  AVG/AVF Venous Site Held (minutes) 10 minutes  Education / Care Plan  Dialysis Education Provided Yes  Documented Education in Care Plan Yes  Fistula / Graft Left Upper arm Arteriovenous fistula  No Placement Date or Time found.   Placed prior to admission: Yes  Orientation: Left  Access Location: Upper arm  Access Type: Arteriovenous fistula  Site Condition No complications  Fistula / Graft Assessment Present;Thrill;Bruit  Status Deaccessed  Drainage Description None

## 2018-03-02 NOTE — ED Notes (Signed)
Patient transported to 240

## 2018-03-02 NOTE — Progress Notes (Signed)
Pre HD assessment    03/02/18 1456  Vital Signs  Temp 98.6 F (37 C)  Temp Source Oral  Pulse Rate 65  Pulse Rate Source Monitor  Resp (!) 24  BP (!) 145/75  BP Location Right Arm  BP Method Automatic  Patient Position (if appropriate) Lying  Oxygen Therapy  SpO2 99 %  O2 Device Nasal Cannula  O2 Flow Rate (L/min) 2 L/min  Pain Assessment  Pain Scale 0-10  Pain Score 0  Dialysis Weight  Weight 105.5 kg (232 lb 9.4 oz)  Type of Weight Pre-Dialysis  Time-Out for Hemodialysis  What Procedure? HD  Pt Identifiers(min of two) First/Last Name;MRN/Account#  Correct Site? Yes  Correct Side? Yes  Correct Procedure? Yes  Consents Verified? Yes  Rad Studies Available? N/A  Safety Precautions Reviewed? Yes  Engineer, civil (consulting) Number  ([id number])  Station Number 3  UF/Alarm Test Passed  Conductivity: Meter 14  Conductivity: Machine  14.2  pH 7.4  Reverse Osmosis main  Normal [place]ine Lot Number [id number]  Dialyzer Lot Number [id number]  Disposable Set Lot Number [id number]  Machine Temperature 98.6 F (37 C)  Musician and Audible Yes  Blood Lines Intact and Secured Yes  Pre Treatment Patient Checks  Vascular access used during treatment Fistula  Hepatitis B Surface Antigen Results Negative  Date Hepatitis B Surface Antigen Drawn 01/11/18  Hepatitis B Surface Antibody  (>10)  Date Hepatitis B Surface Antibody Drawn 01/11/18  Hemodialysis Consent Verified Yes  Hemodialysis Standing Orders Initiated Yes  ECG (Telemetry) Monitor On Yes  Prime Ordered Normal Saline  Length of  DialysisTreatment -hour(s) 3.5 Hour(s)  Dialyzer Elisio 17H NR  Dialysate 2K, 2.5 Ca  Dialysis Anticoagulant None  Dialysate Flow Ordered 800  Blood Flow Rate Ordered 400 mL/min  Ultrafiltration Goal 2.5 Liters  Pre Treatment Labs Phosphorus  Dialysis Blood Pressure Support Ordered Normal Saline  Education / Care Plan  Dialysis Education Provided Yes  Documented Education in Care Plan Yes   Fistula / Graft Left Upper arm Arteriovenous fistula  No Placement Date or Time found.   Placed prior to admission: Yes  Orientation: Left  Access Location: Upper arm  Access Type: Arteriovenous fistula  Site Condition No complications  Fistula / Graft Assessment Present;Thrill;Bruit  Drainage Description None

## 2018-03-02 NOTE — Care Management (Signed)
Patient admitted from home with chest pain.  Patient states that she lives at home with her son.  Patient states that when she feels well she drives herself.  Patient is and HD patient Kimberly Shaffer dialysis liaison notified of admission.  Patient with a history of heart failure, currently requiring acute O2.  Patient states that she had no equipment at home. Patient states that she has Medicaid PCS services. 7 days a week for 3 hours a day.  Patient has had 5 admissions in 6 months.  Patient would benefit from home health services.  Patient agreeable to services and states she does not have a preference.  Heads up referral given to [facility].

## 2018-03-02 NOTE — Progress Notes (Signed)
Pre HD assessment    03/02/18 1457  Neurological  Level of Consciousness Alert  Orientation Level Oriented X4  Respiratory  Respiratory Pattern Regular;Unlabored  Chest Assessment Chest expansion symmetrical  Cardiac  ECG Monitor Yes  Antiarrhythmic device No  Vascular  R Radial Pulse +2  L Radial Pulse +2  Edema Right lower extremity;Left lower extremity  Integumentary  Integumentary (WDL) X  Skin Color Appropriate for ethnicity  Musculoskeletal  Musculoskeletal (WDL) X  Generalized Weakness Yes  Assistive Device None  GU Assessment  Genitourinary (WDL) X  Genitourinary Symptoms  (HD)  Psychosocial  Psychosocial (WDL) WDL

## 2018-03-03 DIAGNOSIS — I132 Hypertensive heart and chronic kidney disease with heart failure and with stage 5 chronic kidney disease, or end stage renal disease: Secondary | ICD-10-CM | POA: Diagnosis not present

## 2018-03-03 LAB — GLUCOSE, CAPILLARY
GLUCOSE-CAPILLARY: 105 mg/dL — AB (ref 65–99)
Glucose-Capillary: 84 mg/dL (ref 65–99)

## 2018-03-03 MED ORDER — ROSUVASTATIN CALCIUM 20 MG PO TABS: 20.0000 mg | ORAL_TABLET | Freq: Every day | ORAL | 1 refills | Status: DC

## 2018-03-03 NOTE — Care Management Obs Status (Signed)
[place] NOTIFICATION   Patient Details  Name: DESTYNI HOPPEL MRN: [id number] Date of Birth: 1950-10-15   Medicare Observation Status Notification Given:  Yes(appears to be done 03/02/18 via [place] letter unsigned)    Marshell Garfinkel, RN 03/03/2018, 7:50 AM

## 2018-03-03 NOTE — Discharge Summary (Signed)
[place] [place] NAME: Kimberly Shaffer    MR#:  [id number]  DATE OF BIRTH:  [birth year]  DATE OF ADMISSION:  03/02/2018 ADMITTING PHYSICIAN: Lance Coon, MD  DATE OF DISCHARGE: No discharge date for patient encounter.  PRIMARY CARE PHYSICIAN: [place], [facility]   ADMISSION DIAGNOSIS:  chest pain  Acute on chronic diastolic heart failure End-stage renal disease on dialysis Hypertension Coronary artery disease Obstructive sleep apnea  DISCHARGE DIAGNOSIS:  Principal Problem:   Acute on chronic diastolic CHF (congestive heart failure) (HCC) Active Problems:   ESRD on dialysis ([place])   Benign essential hypertension   Coronary artery disease of native artery of native heart with stable angina pectoris (HCC)   OSA on CPAP   Type 2 diabetes mellitus with diabetic neuropathy (HCC)   GERD (gastroesophageal reflux disease) Atypical chest pain  SECONDARY DIAGNOSIS:   Past Medical History:  Diagnosis Date  . Asthma   . CAD (coronary artery disease)   . Chronic lower back pain   . COPD (chronic obstructive pulmonary disease) ([place])   . Depression   . Diabetes mellitus without complication (HCC)    NIDD  . H/O angina pectoris   . H/O blood clots   . Hypercholesteremia   . Hypertension   . Left rotator cuff tear   . Myocardial infarction ([place])   . Obesity   . Renal insufficiency   . Stroke ([place])   . Vertigo, aural      ADMITTING HISTORY Kimberly Shaffer  is a 67 y.o. female who presents with shortness of breath and chest pain.  Patient was just recently discharged from her hospital.  She returns tonight with some chest discomfort and shortness of breath and again has edema on x-ray.  Her work-up is consistent with heart failure exacerbation/fluid overload.  She is a dialysis patient as well.  Hospitalist were called for admission.  HOSPITAL COURSE:  Patient was admitted to telemetry.  Patient was dialyzed on the day of admission  to remove excess fluid.  Patient was seen by nephrology attending during the hospitalization.  Patient also dialyzed on 03/03/2018.  Her shortness of breath improved after excess fluid was removed by dialysis.  Chest pain completely resolved.  She received 1 dose of IV Lasix in the emergency room at the time of admission.  Her elevated troponin was secondary to renal failure.  Patient hemodynamically stable will be discharged home and continue her dialysis Tuesday Thursday and Saturday.  CONSULTS OBTAINED:  Treatment Team:  Murlean Iba, MD  DRUG ALLERGIES:   Allergies  Allergen Reactions  . Nystatin Hives and Itching  . Sulfa Antibiotics Swelling, Hives and Rash  . Niaspan [Niacin] Other (See Comments) and Hives    DISCHARGE MEDICATIONS:   Allergies as of 03/03/2018      Reactions   Nystatin Hives, Itching   Sulfa Antibiotics Swelling, Hives, Rash   Niaspan [niacin] Other (See Comments), Hives      Medication List    STOP taking these medications   meloxicam 15 MG tablet Commonly known as:  MOBIC     TAKE these medications   albuterol 108 (90 Base) MCG/ACT inhaler Commonly known as:  PROVENTIL HFA;VENTOLIN HFA Inhale 2 puffs into the lungs every 6 (six) hours as needed for wheezing or shortness of breath.   aspirin EC 81 MG tablet Take 81 mg by mouth daily.   calcium carbonate 1500 (600 Ca) MG Tabs tablet Commonly known as:  OSCAL Take 600 mg of elemental calcium by mouth daily with breakfast.   clopidogrel 75 MG tablet Commonly known as:  PLAVIX Take 1 tablet by mouth daily.   feeding supplement (NEPRO CARB STEADY) Liqd Take 237 mLs by mouth 2 (two) times daily between meals.   feeding supplement (NEPRO CARB STEADY) Liqd Take 237 mLs by mouth daily.   gabapentin 100 MG capsule Commonly known as:  NEURONTIN Take 100 mg by mouth at bedtime.   ipratropium 0.02 % nebulizer solution Commonly known as:  ATROVENT Take 2.5 mLs (0.5 mg total) by nebulization every  6 (six) hours as needed for wheezing or shortness of breath.   meclizine 12.5 MG tablet Commonly known as:  ANTIVERT Take 1 tablet (12.5 mg total) by mouth 3 (three) times daily as needed for dizziness or nausea.   methocarbamol 750 MG tablet Commonly known as:  ROBAXIN Take 750 mg by mouth 2 (two) times daily as needed for muscle spasms.   nicotine 21 mg/24hr patch Commonly known as:  NICODERM CQ - dosed in mg/24 hours Place 1 patch (21 mg total) onto the skin daily.   nitroGLYCERIN 0.4 MG/SPRAY spray Commonly known as:  NITROLINGUAL Place 2 sprays under the tongue every 5 (five) minutes x 3 doses as needed for chest pain.   Oxycodone HCl 10 MG Tabs Take 10 mg by mouth every 6 (six) hours as needed.   pantoprazole 40 MG tablet Commonly known as:  PROTONIX Take 40 mg by mouth 2 (two) times daily.   RENVELA 2.4 g Pack Generic drug:  sevelamer carbonate Take 2.4 g by mouth 2 (two) times daily.   rosuvastatin 20 MG tablet Commonly known as:  CRESTOR Take 1 tablet (20 mg total) by mouth daily.   SENSIPAR 30 MG tablet Generic drug:  cinacalcet Take 1 tablet (30 mg total) by mouth daily.   SPIRIVA HANDIHALER 18 MCG inhalation capsule Generic drug:  tiotropium Place 1 capsule into inhaler and inhale daily.   SYMBICORT 160-4.5 MCG/ACT inhaler Generic drug:  budesonide-formoterol Inhale 2 puffs into the lungs 2 (two) times daily.   venlafaxine XR 37.5 MG 24 hr capsule Commonly known as:  EFFEXOR-XR Take 37.5 mg by mouth daily with breakfast.       Today  Patient seen and evaluated on the day of discharge No chest pain No shortness of breath Tolerating diet well VITAL SIGNS:  Blood pressure (!) 149/67, pulse 71, temperature 98.9 F (37.2 C), temperature source Oral, resp. rate 16, height 5\' 7"  (1.702 m), weight 100.1 kg (220 lb 11.2 oz), SpO2 99 %.  I/O:    Intake/Output Summary (Last 24 hours) at 03/03/2018 1352 Last data filed at 03/03/2018 1041 Gross per 24  hour  Intake 360 ml  Output 2521 ml  Net -2161 ml    PHYSICAL EXAMINATION:  Physical Exam  GENERAL:  67 y.o.-year-old patient lying in the bed with no acute distress.  LUNGS: Normal breath sounds bilaterally, no wheezing, rales,rhonchi or crepitation. No use of accessory muscles of respiration.  CARDIOVASCULAR: S1, S2 normal. No murmurs, rubs, or gallops.  ABDOMEN: Soft, non-tender, non-distended. Bowel sounds present. No organomegaly or mass.  NEUROLOGIC: Moves all 4 extremities. PSYCHIATRIC: The patient is alert and oriented x 3.  SKIN: No obvious rash, lesion, or ulcer.   DATA REVIEW:   CBC Recent Labs  Lab 03/02/18 0430  WBC 7.2  HGB 9.2*  HCT 27.4*  PLT 175    Chemistries  Recent Labs  Lab 03/02/18  0430  NA 136  K 5.0  CL 102  CO2 21*  GLUCOSE 93  BUN 58*  CREATININE 8.23*  8.25*  CALCIUM 8.7*    Cardiac Enzymes Recent Labs  Lab 03/02/18 0943  TROPONINI 0.32*    Microbiology Results  Results for orders placed or performed during the hospital encounter of 02/26/18  MRSA PCR Screening     Status: None   Collection Time: 02/26/18  2:38 PM  Result Value Ref Range Status   MRSA by PCR NEGATIVE NEGATIVE Final    Comment:        The GeneXpert MRSA Assay (FDA approved for NASAL specimens only), is one component of a comprehensive MRSA colonization surveillance program. It is not intended to diagnose MRSA infection nor to guide or monitor treatment for MRSA infections. Performed at [facility] [address], [place], [place] [address]     RADIOLOGY:  Dg Chest Port 1 View  Result Date: 03/02/2018 CLINICAL DATA:  Chest pain and shortness of breath. EXAM: PORTABLE CHEST 1 VIEW COMPARISON:  Frontal and lateral views 02/26/2018 FINDINGS: Unchanged cardiomegaly with aortic atherosclerosis. Unchanged interstitial edema from recent exam. No new focal airspace disease. No large pleural effusion. No pneumothorax. IMPRESSION: Unchanged  cardiomegaly and pulmonary edema from exam 4 days ago. No new abnormality. Electronically Signed   By: Jeb Levering M.D.   On: 03/02/2018 01:20    Follow up with PCP in 1 week.  Management plans discussed with the patient, family and they are in agreement.  CODE STATUS: Full code    Code Status Orders  (From admission, onward)        Start     Ordered   03/02/18 0314  Full code  Continuous     03/02/18 0313    Code Status History    Date Active Date Inactive Code Status Order ID Comments User Context   02/26/2018 0716 02/27/2018 1821 Full Code [id number]  Arta Silence, MD Inpatient   01/11/2018 2030 01/14/2018 1509 Full Code [id number]  Saundra Shelling, MD Inpatient   12/25/2017 0835 12/25/2017 1743 Full Code [id number]  Teodoro Spray, MD Inpatient   12/23/2017 0927 12/25/2017 0834 Full Code [id number]  Harrie Foreman, MD Inpatient   12/08/2017 0357 12/08/2017 2344 Full Code [id number]  Harrie Foreman, MD Inpatient   06/28/2017 0749 07/01/2017 2218 Full Code [id number]  Harrie Foreman, MD Inpatient   07/19/2016 0857 07/19/2016 1319 Full Code [id number]  Corey Skains, MD Inpatient   07/05/2016 2236 07/09/2016 1950 Full Code [id number]  [place], [place], DO Inpatient   12/17/2015 0812 12/20/2015 1957 Full Code [id number]  Saundra Shelling, MD Inpatient      TOTAL TIME TAKING CARE OF THIS PATIENT ON DAY OF DISCHARGE: more than 35 minutes.   Saundra Shelling M.D on 03/03/2018 at 1:52 PM  Between 7am to 6pm - Pager - [phone]  After 6pm go to [email or web address] - password [email or web address] [facility]  Office  [phone]  CC: Primary care physician; [facility]  Note: This dictation was prepared with Dragon dictation along with smaller phrase technology. Any transcriptional errors that result from this process are unintentional.

## 2018-03-03 NOTE — Progress Notes (Signed)
[facility], [place] 03/03/18  Subjective:   Patient 2500 cc fluid was removed with hemodialysis yesterday Today, she is on room air and feels better No chest pain no shortness of breath this morning Patient admits to drinking excessive amount of fluid at home without realizing   Objective:  Vital signs in last 24 hours:  Temp:  [97.5 F (36.4 C)-99.1 F (37.3 C)] 98.9 F (37.2 C) (06/15 1100) Pulse Rate:  [60-77] 75 (06/15 1300) Resp:  [13-27] 17 (06/15 1300) BP: (126-166)/(58-93) 146/69 (06/15 1245) SpO2:  [91 %-100 %] 99 % (06/15 1115) Weight:  [100.1 kg (220 lb 11.2 oz)-105.5 kg (232 lb 9.4 oz)] 100.1 kg (220 lb 11.2 oz) (06/15 0427)  Weight change: 4.348 kg (9 lb 9.4 oz) Filed Weights   03/02/18 1456 03/02/18 1852 03/03/18 0427  Weight: 105.5 kg (232 lb 9.4 oz) 102.9 kg (226 lb 13.7 oz) 100.1 kg (220 lb 11.2 oz)    Intake/Output:    Intake/Output Summary (Last 24 hours) at 03/03/2018 1310 Last data filed at 03/03/2018 1041 Gross per 24 hour  Intake 600 ml  Output 2521 ml  Net -1921 ml     Physical Exam: General:  No acute distress, laying in the bed  HEENT  anicteric, moist oral mucous membranes  Neck  supple   Pulm/lungs  room air, clear to auscultation bilaterally  CVS/Heart  regular, no rub or gallop  Abdomen:   Soft, nontender  Extremities:  No peripheral edema  Neurologic:  Alert, oriented  Skin:  No acute rashes  Access:  Left upper extremity AV fistula       Basic Metabolic Panel:  Recent Labs  Lab 02/26/18 0253 03/02/18 0103 03/02/18 0430 03/02/18 1349  NA 138 137 136  --   K 4.1 4.4 5.0  --   CL 103 101 102  --   CO2 22 22 21*  --   GLUCOSE 149* 106* 93  --   BUN 41* 54* 58*  --   CREATININE 7.25* 8.13* 8.23*  8.25*  --   CALCIUM 9.2 9.1 8.7*  --   PHOS  --   --   --  7.1*     CBC: Recent Labs  Lab 02/26/18 0253 03/02/18 0103 03/02/18 0430  WBC 6.3 7.1 7.2  HGB 9.6* 9.6* 9.2*  HCT 28.7* 29.2*  27.4*  MCV 96.0 95.2 94.5  PLT 218 191 175      Lab Results  Component Value Date   HEPBSAG Negative 01/11/2018   HEPBSAB Reactive 01/11/2018      Microbiology:  Recent Results (from the past 240 hour(s))  MRSA PCR Screening     Status: None   Collection Time: 02/26/18  2:38 PM  Result Value Ref Range Status   MRSA by PCR NEGATIVE NEGATIVE Final    Comment:        The GeneXpert MRSA Assay (FDA approved for NASAL specimens only), is one component of a comprehensive MRSA colonization surveillance program. It is not intended to diagnose MRSA infection nor to guide or monitor treatment for MRSA infections. Performed at [facility], [address] [place], [place] [address]     Coagulation Studies: No results for input(s): LABPROT, INR in the last 72 hours.  Urinalysis: No results for input(s): COLORURINE, LABSPEC, PHURINE, GLUCOSEU, HGBUR, BILIRUBINUR, KETONESUR, PROTEINUR, UROBILINOGEN, NITRITE, LEUKOCYTESUR in the last 72 hours.  Invalid input(s): APPERANCEUR    Imaging: Dg Chest Port 1 View  Result Date: 03/02/2018  CLINICAL DATA:  Chest pain and shortness of breath. EXAM: PORTABLE CHEST 1 VIEW COMPARISON:  Frontal and lateral views 02/26/2018 FINDINGS: Unchanged cardiomegaly with aortic atherosclerosis. Unchanged interstitial edema from recent exam. No new focal airspace disease. No large pleural effusion. No pneumothorax. IMPRESSION: Unchanged cardiomegaly and pulmonary edema from exam 4 days ago. No new abnormality. Electronically Signed   By: Jeb Levering M.D.   On: 03/02/2018 01:20     Medications:    . aspirin EC  81 mg Oral Daily  . calcium carbonate  600 mg of elemental calcium Oral Q breakfast  . Chlorhexidine Gluconate Cloth  6 each Topical Q0600  . cinacalcet  30 mg Oral Daily  . clopidogrel  75 mg Oral Daily  . gabapentin  100 mg Oral QHS  . heparin  5,000 Units Subcutaneous Q8H  . insulin aspart  0-5 Units Subcutaneous QHS  .  insulin aspart  0-9 Units Subcutaneous TID WC  . mometasone-formoterol  2 puff Inhalation BID  . pantoprazole  40 mg Oral BID  . rosuvastatin  20 mg Oral Daily  . sevelamer carbonate  2.4 g Oral BID  . tiotropium  1 capsule Inhalation Daily  . venlafaxine XR  37.5 mg Oral Q breakfast   acetaminophen **OR** acetaminophen, albuterol, methocarbamol, nitroGLYCERIN, ondansetron **OR** ondansetron (ZOFRAN) IV, oxyCODONE  Assessment/ Plan:  67 y.o. African-American female with end stage renal disease on hemodialysis, coronary artery disease status post CABG, hypertension, peripheral vascular disease, diabetes mellitus type 2, hyperlipidemia, bilateral total knee replacement, PD catheter removal June 30, 2017  [facility]TTS/left AVF/ 100 kg  1.  Shortness of breath,Acute pulmonary edema 2.  End-stage renal disease on hemodialysis  3.  Secondary hyperparathyroidism 4.  Anemia chronic kidney disease  Plan: Patient was recently evaluated by cardiologist with medical management was recommended.  Based on chest x-ray and clinical exam, patient does appear to be slightly fluid overload.   Patient has improved after volume removal with hemodialysis.  Today, we will dialyze her to get her back on schedule as she is not able to get to her outpatient clinic this morning. Outpatient hemodialysis EDW changed to 100 kg.  Follow-up outpatient.      LOS: 0 Kimberly Shaffer 6/15/20191:10 PM  [place], [place]  Note: This note was prepared with Dragon dictation. Any transcription errors are unintentional

## 2018-03-03 NOTE — Progress Notes (Signed)
This note also relates to the following rows which could not be included: Pulse Rate - Cannot attach notes to unvalidated device data Resp - Cannot attach notes to unvalidated device data BP - Cannot attach notes to unvalidated device data SpO2 - Cannot attach notes to unvalidated device data  Hd started  

## 2018-03-03 NOTE — Care Management (Signed)
Patient to be discharged per MD order. Patient has orders for HHPT. Patient given choice of [place] agency and prefers advanced home care. Referral placed with [facility]. Orders placed for DME walker. It is documented that patient had 24 hr PCS but that is actually for her son who is quadriplegic. Patient provides care for him in the home. Daughter is providing transport home tonight. No further RNCM needs.  Ines Bloomer RN BSN RNCM [phone]

## 2018-03-03 NOTE — Progress Notes (Signed)
This note also relates to the following rows which could not be included: Pulse Rate - Cannot attach notes to unvalidated device data Resp - Cannot attach notes to unvalidated device data BP - Cannot attach notes to unvalidated device data  Hd completed  

## 2018-03-03 NOTE — Care Management (Signed)
Kimberly Shaffer with [facility] notified of patient discharge today.

## 2018-03-03 NOTE — Progress Notes (Signed)
Pt requested CPAP removal. Pt states it's hard to use our nasal mask compared to her nasal pillows. Pt returned to RA

## 2018-03-03 NOTE — Progress Notes (Signed)
Pt placed on [facility] Dreamstation CPAP. CPAP plugged into red outlet. CPAP set to home settings per pt.

## 2018-03-05 ENCOUNTER — Telehealth: Payer: Self-pay

## 2018-03-05 NOTE — Telephone Encounter (Signed)
Flagged on EMMI report for not receiving discharge papers.  Called and spoke with patient.  She mentioned she did not think she left with the papers, stating the only thing she left with was a walker.  I asked if she would like a copy of the papers and she said yes.  Confirmed address to send discharge papers to and mentioned she would get them in the next few days.  I noticed while reviewing patient's discharge summary that Dr. Estanislado Pandy mentioned for her to follow up with PCP in one week, which was left off AVS.  Relayed this to patient who said she had an appointment coming up.  No other questions or concerns at this time.  I thanked her for her time.

## 2018-03-08 NOTE — Discharge Summary (Signed)
Kimberly Shaffer at [place] NAME: Kimberly Shaffer    MR#:  [id number]  DATE OF BIRTH:  [birth year]  DATE OF ADMISSION:  02/26/2018 ADMITTING PHYSICIAN: Arta Silence, MD  DATE OF DISCHARGE: 02/27/2018  2:50 PM  PRIMARY CARE PHYSICIAN: [place], [facility]   ADMISSION DIAGNOSIS:  Acute pulmonary edema ([place]) [J81.0] Hypoxia [R09.02] Chest pain, unspecified type [R07.9]  DISCHARGE DIAGNOSIS:  Active Problems:   Chest pain   SECONDARY DIAGNOSIS:   Past Medical History:  Diagnosis Date  . Asthma   . CAD (coronary artery disease)   . Chronic lower back pain   . COPD (chronic obstructive pulmonary disease) ([place])   . Depression   . Diabetes mellitus without complication (HCC)    NIDD  . H/O angina pectoris   . H/O blood clots   . Hypercholesteremia   . Hypertension   . Left rotator cuff tear   . Myocardial infarction ([place])   . Obesity   . Renal insufficiency   . Stroke ([place])   . Vertigo, aural      ADMITTING HISTORY  HISTORY OF PRESENT ILLNESS:  Kimberly Shaffer  is a 67 y.o. female with a known history of CAD, COPD, T2NIDDM, ESRD (TTS via LUE AVF) p/w CP/SOB. Pt is an abyssmal historian. She endorses L-sided CP @~2000PM, radiating to L neck + L arm. She cannot characterize it. It is non-reproducible. She stayed at rest, and doesn't know if the pain was exertional. She endorses SOB. She denies cough, hemoptysis, wheezing. She endorses N/V x1 at home. She had dialysis on Saturday. SpO2 80% range in ED.  HOSPITAL COURSE:   *Chest pain. Patient was admitted to telemetry unit. Monitored through repeat troponin. Troponin remain normal. Patient seen by cardiology who thought her chest pressure was due to pulmonary edema.  *Acute on chronic diastolic congestive heart failure secondary to fluid overload in hemodialysis patient. Patient had hemodialysis in the hospital with significant improvement. She felt back to normal by the day of  discharge. Fluid overload has resolved. Patient counseled to limit fluid intake and be compliant with dialysis.  Discharged home in stable condition.  CONSULTS OBTAINED:  Treatment Team:  Arta Silence, MD Murlean Iba, MD Teodoro Spray, MD  DRUG ALLERGIES:   Allergies  Allergen Reactions  . Nystatin Hives and Itching  . Sulfa Antibiotics Swelling, Hives and Rash  . Niaspan [Niacin] Other (See Comments) and Hives    DISCHARGE MEDICATIONS:   Allergies as of 02/27/2018      Reactions   Nystatin Hives, Itching   Sulfa Antibiotics Swelling, Hives, Rash   Niaspan [niacin] Other (See Comments), Hives      Medication List    TAKE these medications   albuterol 108 (90 Base) MCG/ACT inhaler Commonly known as:  PROVENTIL HFA;VENTOLIN HFA Inhale 2 puffs into the lungs every 6 (six) hours as needed for wheezing or shortness of breath.   aspirin EC 81 MG tablet Take 81 mg by mouth daily.   calcium carbonate 1500 (600 Ca) MG Tabs tablet Commonly known as:  OSCAL Take 600 mg of elemental calcium by mouth daily with breakfast.   clopidogrel 75 MG tablet Commonly known as:  PLAVIX Take 1 tablet by mouth daily.   feeding supplement (NEPRO CARB STEADY) Liqd Take 237 mLs by mouth 2 (two) times daily between meals. What changed:  Another medication with the same name was added. Make sure you understand how and when to take each.  feeding supplement (NEPRO CARB STEADY) Liqd Take 237 mLs by mouth daily. What changed:  You were already taking a medication with the same name, and this prescription was added. Make sure you understand how and when to take each.   gabapentin 100 MG capsule Commonly known as:  NEURONTIN Take 100 mg by mouth at bedtime.   ipratropium 0.02 % nebulizer solution Commonly known as:  ATROVENT Take 2.5 mLs (0.5 mg total) by nebulization every 6 (six) hours as needed for wheezing or shortness of breath.   meclizine 12.5 MG tablet Commonly known  as:  ANTIVERT Take 1 tablet (12.5 mg total) by mouth 3 (three) times daily as needed for dizziness or nausea.   methocarbamol 750 MG tablet Commonly known as:  ROBAXIN Take 750 mg by mouth 2 (two) times daily as needed for muscle spasms.   nicotine 21 mg/24hr patch Commonly known as:  NICODERM CQ - dosed in mg/24 hours Place 1 patch (21 mg total) onto the skin daily.   nitroGLYCERIN 0.4 MG/SPRAY spray Commonly known as:  NITROLINGUAL Place 2 sprays under the tongue every 5 (five) minutes x 3 doses as needed for chest pain.   Oxycodone HCl 10 MG Tabs Take 10 mg by mouth every 6 (six) hours as needed.   pantoprazole 40 MG tablet Commonly known as:  PROTONIX Take 40 mg by mouth 2 (two) times daily.   RENVELA 2.4 g Pack Generic drug:  sevelamer carbonate Take 2.4 g by mouth 2 (two) times daily.   SENSIPAR 30 MG tablet Generic drug:  cinacalcet Take 1 tablet (30 mg total) by mouth daily.   SPIRIVA HANDIHALER 18 MCG inhalation capsule Generic drug:  tiotropium Place 1 capsule into inhaler and inhale daily.   SYMBICORT 160-4.5 MCG/ACT inhaler Generic drug:  budesonide-formoterol Inhale 2 puffs into the lungs 2 (two) times daily.   venlafaxine XR 37.5 MG 24 hr capsule Commonly known as:  EFFEXOR-XR Take 37.5 mg by mouth daily with breakfast.       Today   VITAL SIGNS:  Blood pressure 127/71, pulse 79, temperature 98.5 F (36.9 C), temperature source Oral, resp. rate 20, height 5\' 6"  (1.676 m), weight 103.1 kg (227 lb 4.7 oz), SpO2 98 %.  I/O:  No intake or output data in the 24 hours ending 03/08/18 1857  PHYSICAL EXAMINATION:  Physical Exam  GENERAL:  67 y.o.-year-old patient lying in the bed with no acute distress.  LUNGS: Normal breath sounds bilaterally, no wheezing, rales,rhonchi or crepitation. No use of accessory muscles of respiration.  CARDIOVASCULAR: S1, S2 normal. No murmurs, rubs, or gallops.  ABDOMEN: Soft, non-tender, non-distended. Bowel sounds  present. No organomegaly or mass.  NEUROLOGIC: Moves all 4 extremities. PSYCHIATRIC: The patient is alert and oriented x 3.  SKIN: No obvious rash, lesion, or ulcer.   DATA REVIEW:   CBC Recent Labs  Lab 03/02/18 0430  WBC 7.2  HGB 9.2*  HCT 27.4*  PLT 175    Chemistries  Recent Labs  Lab 03/02/18 0430  NA 136  K 5.0  CL 102  CO2 21*  GLUCOSE 93  BUN 58*  CREATININE 8.23*  8.25*  CALCIUM 8.7*    Cardiac Enzymes Recent Labs  Lab 03/02/18 0943  TROPONINI 0.32*    Microbiology Results  Results for orders placed or performed during the hospital encounter of 02/26/18  MRSA PCR Screening     Status: None   Collection Time: 02/26/18  2:38 PM  Result Value Ref Range Status  MRSA by PCR NEGATIVE NEGATIVE Final    Comment:        The GeneXpert MRSA Assay (FDA approved for NASAL specimens only), is one component of a comprehensive MRSA colonization surveillance program. It is not intended to diagnose MRSA infection nor to guide or monitor treatment for MRSA infections. Performed at [facility], [address] [place], [place] [address]     RADIOLOGY:  No results found.  Follow up with PCP in 1 week.  Management plans discussed with the patient, family and they are in agreement.  CODE STATUS:  Code Status History    Date Active Date Inactive Code Status Order ID Comments User Context   03/02/2018 0313 03/03/2018 1952 Full Code [id number]  Lance Coon, MD ED   02/26/2018 0716 02/27/2018 1821 Full Code [id number]  Arta Silence, MD Inpatient   01/11/2018 2030 01/14/2018 1509 Full Code [id number]  Saundra Shelling, MD Inpatient   12/25/2017 0835 12/25/2017 1743 Full Code [id number]  Teodoro Spray, MD Inpatient   12/23/2017 0927 12/25/2017 0834 Full Code [id number]  Harrie Foreman, MD Inpatient   12/08/2017 0357 12/08/2017 2344 Full Code [id number]  Harrie Foreman, MD Inpatient   06/28/2017 0749 07/01/2017 2218 Full Code [id number]  Harrie Foreman,  MD Inpatient   07/19/2016 0857 07/19/2016 1319 Full Code [id number]  Corey Skains, MD Inpatient   07/05/2016 2236 07/09/2016 1950 Full Code [id number]  [place], [place], DO Inpatient   12/17/2015 0812 12/20/2015 1957 Full Code [id number]  Saundra Shelling, MD Inpatient      TOTAL TIME TAKING CARE OF THIS PATIENT ON DAY OF DISCHARGE: more than 30 minutes.   Leia Alf Lyrique Hakim M.D on 03/08/2018 at 6:57 PM  Between 7am to 6pm - Pager - [phone]  After 6pm go to [email or web address] - password [email or web address] [facility]  Office  [phone]  CC: Primary care physician; [facility]  Note: This dictation was prepared with Dragon dictation along with smaller phrase technology. Any transcriptional errors that result from this process are unintentional.

## 2018-03-20 ENCOUNTER — Other Ambulatory Visit: Payer: Self-pay

## 2018-03-20 ENCOUNTER — Observation Stay
Admission: EM | Admit: 2018-03-20 | Discharge: 2018-03-21 | Disposition: A | Discharge status: Home / Self Care | Payer: Medicare Other | Source: Home / Self Care | Attending: Emergency Medicine | Admitting: Emergency Medicine

## 2018-03-20 ENCOUNTER — Emergency Department: Payer: Medicare Other

## 2018-03-20 DIAGNOSIS — E785 Hyperlipidemia, unspecified: Secondary | ICD-10-CM

## 2018-03-20 DIAGNOSIS — Z992 Dependence on renal dialysis: Secondary | ICD-10-CM

## 2018-03-20 DIAGNOSIS — E669 Obesity, unspecified: Secondary | ICD-10-CM | POA: Insufficient documentation

## 2018-03-20 DIAGNOSIS — N186 End stage renal disease: Secondary | ICD-10-CM

## 2018-03-20 DIAGNOSIS — Z951 Presence of aortocoronary bypass graft: Secondary | ICD-10-CM | POA: Insufficient documentation

## 2018-03-20 DIAGNOSIS — J810 Acute pulmonary edema: Secondary | ICD-10-CM | POA: Insufficient documentation

## 2018-03-20 DIAGNOSIS — Z79899 Other long term (current) drug therapy: Secondary | ICD-10-CM | POA: Insufficient documentation

## 2018-03-20 DIAGNOSIS — I2510 Atherosclerotic heart disease of native coronary artery without angina pectoris: Secondary | ICD-10-CM

## 2018-03-20 DIAGNOSIS — F17210 Nicotine dependence, cigarettes, uncomplicated: Secondary | ICD-10-CM

## 2018-03-20 DIAGNOSIS — N2581 Secondary hyperparathyroidism of renal origin: Secondary | ICD-10-CM

## 2018-03-20 DIAGNOSIS — Z8673 Personal history of transient ischemic attack (TIA), and cerebral infarction without residual deficits: Secondary | ICD-10-CM | POA: Insufficient documentation

## 2018-03-20 DIAGNOSIS — Z955 Presence of coronary angioplasty implant and graft: Secondary | ICD-10-CM

## 2018-03-20 DIAGNOSIS — E7800 Pure hypercholesterolemia, unspecified: Secondary | ICD-10-CM | POA: Insufficient documentation

## 2018-03-20 DIAGNOSIS — E1151 Type 2 diabetes mellitus with diabetic peripheral angiopathy without gangrene: Secondary | ICD-10-CM

## 2018-03-20 DIAGNOSIS — D631 Anemia in chronic kidney disease: Secondary | ICD-10-CM | POA: Insufficient documentation

## 2018-03-20 DIAGNOSIS — Z7982 Long term (current) use of aspirin: Secondary | ICD-10-CM

## 2018-03-20 DIAGNOSIS — I120 Hypertensive chronic kidney disease with stage 5 chronic kidney disease or end stage renal disease: Secondary | ICD-10-CM

## 2018-03-20 DIAGNOSIS — Z6835 Body mass index (BMI) 35.0-35.9, adult: Secondary | ICD-10-CM

## 2018-03-20 DIAGNOSIS — Z7902 Long term (current) use of antithrombotics/antiplatelets: Secondary | ICD-10-CM

## 2018-03-20 DIAGNOSIS — F329 Major depressive disorder, single episode, unspecified: Secondary | ICD-10-CM

## 2018-03-20 DIAGNOSIS — I209 Angina pectoris, unspecified: Secondary | ICD-10-CM | POA: Diagnosis present

## 2018-03-20 DIAGNOSIS — Z881 Allergy status to other antibiotic agents status: Secondary | ICD-10-CM

## 2018-03-20 DIAGNOSIS — Z882 Allergy status to sulfonamides status: Secondary | ICD-10-CM | POA: Insufficient documentation

## 2018-03-20 DIAGNOSIS — J449 Chronic obstructive pulmonary disease, unspecified: Secondary | ICD-10-CM | POA: Insufficient documentation

## 2018-03-20 DIAGNOSIS — E1122 Type 2 diabetes mellitus with diabetic chronic kidney disease: Secondary | ICD-10-CM

## 2018-03-20 DIAGNOSIS — I252 Old myocardial infarction: Secondary | ICD-10-CM | POA: Insufficient documentation

## 2018-03-20 DIAGNOSIS — R079 Chest pain, unspecified: Secondary | ICD-10-CM | POA: Insufficient documentation

## 2018-03-20 DIAGNOSIS — I214 Non-ST elevation (NSTEMI) myocardial infarction: Secondary | ICD-10-CM | POA: Diagnosis not present

## 2018-03-20 DIAGNOSIS — Z96653 Presence of artificial knee joint, bilateral: Secondary | ICD-10-CM

## 2018-03-20 DIAGNOSIS — I700 Atherosclerosis of aorta: Secondary | ICD-10-CM

## 2018-03-20 DIAGNOSIS — I517 Cardiomegaly: Secondary | ICD-10-CM | POA: Insufficient documentation

## 2018-03-20 LAB — BASIC METABOLIC PANEL
ANION GAP: 16 — AB (ref 5–15)
BUN: 68 mg/dL — ABNORMAL HIGH (ref 8–23)
CO2: 23 mmol/L (ref 22–32)
Calcium: 8.8 mg/dL — ABNORMAL LOW (ref 8.9–10.3)
Chloride: 101 mmol/L (ref 98–111)
Creatinine, Ser: 9.92 mg/dL — ABNORMAL HIGH (ref 0.44–1.00)
GFR calc Af Amer: 4 mL/min — ABNORMAL LOW (ref >60)
GFR calc non Af Amer: 4 mL/min — ABNORMAL LOW (ref >60)
GLUCOSE: 126 mg/dL — AB (ref 70–99)
POTASSIUM: 4.9 mmol/L (ref 3.5–5.1)
Sodium: 140 mmol/L (ref 135–145)

## 2018-03-20 LAB — GLUCOSE, CAPILLARY
Glucose-Capillary: 131 mg/dL — ABNORMAL HIGH (ref 70–99)
Glucose-Capillary: 109 mg/dL — ABNORMAL HIGH (ref 70–99)

## 2018-03-20 LAB — HEMOGLOBIN A1C
Hgb A1c MFr Bld: 5.6 % (ref 4.8–5.6)
Mean Plasma Glucose: 114.02 mg/dL

## 2018-03-20 LAB — CBC
HEMATOCRIT: 26.2 % — AB (ref 35.0–47.0)
HEMOGLOBIN: 8.7 g/dL — AB (ref 12.0–16.0)
MCH: 30.9 pg (ref 26.0–34.0)
MCHC: 33.1 g/dL (ref 32.0–36.0)
MCV: 93.2 fL (ref 80.0–100.0)
Platelets: 237 10*3/uL (ref 150–440)
RBC: 2.81 MIL/uL — ABNORMAL LOW (ref 3.80–5.20)
RDW: 17.1 % — ABNORMAL HIGH (ref 11.5–14.5)
WBC: 8.4 10*3/uL (ref 3.6–11.0)

## 2018-03-20 LAB — TROPONIN I
Troponin I: 0.03 ng/mL (ref <0.03)
Troponin I: 0.06 ng/mL (ref <0.03)

## 2018-03-20 LAB — TSH: TSH: 0.27 u[IU]/mL — ABNORMAL LOW (ref 0.350–4.500)

## 2018-03-20 MED ORDER — TIOTROPIUM BROMIDE MONOHYDRATE 18 MCG IN CAPS
1.0000 | ORAL_CAPSULE | Freq: Every day | RESPIRATORY_TRACT | Status: DC
  Administered 2018-03-20 – 2018-03-21 (×2): 18 ug via RESPIRATORY_TRACT
  Filled 2018-03-20: qty 5

## 2018-03-20 MED ORDER — ONDANSETRON HCL 4 MG PO TABS: 4.0000 mg | ORAL_TABLET | Freq: Four times a day (QID) | ORAL | Status: DC | PRN

## 2018-03-20 MED ORDER — ASPIRIN EC 81 MG PO TBEC
81.0000 mg | DELAYED_RELEASE_TABLET | Freq: Every day | ORAL | Status: DC
  Administered 2018-03-20 – 2018-03-21 (×2): 81 mg via ORAL
  Filled 2018-03-20 (×2): qty 1

## 2018-03-20 MED ORDER — HEPARIN SODIUM (PORCINE) 5000 UNIT/ML IJ SOLN
5000.0000 [IU] | Freq: Three times a day (TID) | INTRAMUSCULAR | Status: DC
  Administered 2018-03-20 – 2018-03-21 (×2): 5000 [IU] via SUBCUTANEOUS
  Filled 2018-03-20 (×2): qty 1

## 2018-03-20 MED ORDER — ACETAMINOPHEN 650 MG RE SUPP: 650.0000 mg | Freq: Four times a day (QID) | RECTAL | Status: DC | PRN

## 2018-03-20 MED ORDER — ACETAMINOPHEN 325 MG PO TABS: 650.0000 mg | ORAL_TABLET | Freq: Four times a day (QID) | ORAL | Status: DC | PRN

## 2018-03-20 MED ORDER — GABAPENTIN 100 MG PO CAPS
100.0000 mg | ORAL_CAPSULE | Freq: Every day | ORAL | Status: DC
  Filled 2018-03-20: qty 1

## 2018-03-20 MED ORDER — INSULIN ASPART 100 UNIT/ML ~~LOC~~ SOLN: 0.0000 [IU] | Freq: Every day | SUBCUTANEOUS | Status: DC

## 2018-03-20 MED ORDER — DOCUSATE SODIUM 100 MG PO CAPS
100.0000 mg | ORAL_CAPSULE | Freq: Two times a day (BID) | ORAL | Status: DC
  Administered 2018-03-20 – 2018-03-21 (×3): 100 mg via ORAL
  Filled 2018-03-20 (×3): qty 1

## 2018-03-20 MED ORDER — NEPRO/CARBSTEADY PO LIQD: 237.0000 mL | ORAL | Status: DC

## 2018-03-20 MED ORDER — INSULIN ASPART 100 UNIT/ML ~~LOC~~ SOLN
0.0000 [IU] | Freq: Three times a day (TID) | SUBCUTANEOUS | Status: DC
  Administered 2018-03-20: 1 [IU] via SUBCUTANEOUS
  Filled 2018-03-20: qty 1

## 2018-03-20 MED ORDER — VENLAFAXINE HCL ER 37.5 MG PO CP24
37.5000 mg | ORAL_CAPSULE | Freq: Every day | ORAL | Status: DC
  Administered 2018-03-20 – 2018-03-21 (×2): 37.5 mg via ORAL
  Filled 2018-03-20 (×2): qty 1

## 2018-03-20 MED ORDER — OXYCODONE HCL 5 MG PO TABS
10.0000 mg | ORAL_TABLET | Freq: Four times a day (QID) | ORAL | Status: DC | PRN
  Administered 2018-03-20 – 2018-03-21 (×2): 10 mg via ORAL
  Filled 2018-03-20 (×2): qty 2

## 2018-03-20 MED ORDER — PANTOPRAZOLE SODIUM 40 MG PO TBEC
40.0000 mg | DELAYED_RELEASE_TABLET | Freq: Two times a day (BID) | ORAL | Status: DC
  Administered 2018-03-20 – 2018-03-21 (×3): 40 mg via ORAL
  Filled 2018-03-20 (×3): qty 1

## 2018-03-20 MED ORDER — FLUTICASONE FUROATE-VILANTEROL 200-25 MCG/INH IN AEPB
1.0000 | INHALATION_SPRAY | Freq: Every day | RESPIRATORY_TRACT | Status: DC
  Administered 2018-03-20 – 2018-03-21 (×2): 1 via RESPIRATORY_TRACT
  Filled 2018-03-20: qty 28

## 2018-03-20 MED ORDER — CHLORHEXIDINE GLUCONATE CLOTH 2 % EX PADS
6.0000 | MEDICATED_PAD | Freq: Every day | CUTANEOUS | Status: DC
  Administered 2018-03-20: 6 via TOPICAL

## 2018-03-20 MED ORDER — NITROGLYCERIN 0.4 MG SL SUBL: 0.4000 mg | SUBLINGUAL_TABLET | SUBLINGUAL | Status: DC | PRN

## 2018-03-20 MED ORDER — CALCIUM CARBONATE ANTACID 500 MG PO CHEW
600.0000 mg | CHEWABLE_TABLET | Freq: Every day | ORAL | Status: DC
  Administered 2018-03-20 – 2018-03-21 (×2): 600 mg via ORAL
  Filled 2018-03-20 (×2): qty 3

## 2018-03-20 MED ORDER — ONDANSETRON HCL 4 MG/2ML IJ SOLN: 4.0000 mg | Freq: Four times a day (QID) | INTRAMUSCULAR | Status: DC | PRN

## 2018-03-20 MED ORDER — METHOCARBAMOL 750 MG PO TABS
750.0000 mg | ORAL_TABLET | Freq: Two times a day (BID) | ORAL | Status: DC | PRN
Start: 2018-03-20 — End: 2018-03-21
  Filled 2018-03-20: qty 1

## 2018-03-20 MED ORDER — CLOPIDOGREL BISULFATE 75 MG PO TABS
75.0000 mg | ORAL_TABLET | Freq: Every day | ORAL | Status: DC
  Administered 2018-03-20 – 2018-03-21 (×2): 75 mg via ORAL
  Filled 2018-03-20 (×2): qty 1

## 2018-03-20 MED ORDER — EPOETIN ALFA 10000 UNIT/ML IJ SOLN
4000.0000 [IU] | Freq: Once | INTRAMUSCULAR | Status: AC
  Administered 2018-03-20: 4000 [IU] via INTRAVENOUS
  Filled 2018-03-20 (×2): qty 0.4

## 2018-03-20 MED ORDER — ROSUVASTATIN CALCIUM 10 MG PO TABS
20.0000 mg | ORAL_TABLET | Freq: Every day | ORAL | Status: DC
  Administered 2018-03-20 – 2018-03-21 (×2): 20 mg via ORAL
  Filled 2018-03-20 (×2): qty 2

## 2018-03-20 MED ORDER — MORPHINE SULFATE (PF) 2 MG/ML IV SOLN
2.0000 mg | Freq: Once | INTRAVENOUS | Status: AC
  Administered 2018-03-20: 2 mg via INTRAVENOUS
  Filled 2018-03-20: qty 1

## 2018-03-20 MED ORDER — ONDANSETRON HCL 4 MG/2ML IJ SOLN
4.0000 mg | Freq: Once | INTRAMUSCULAR | Status: AC
  Administered 2018-03-20: 4 mg via INTRAVENOUS
  Filled 2018-03-20: qty 2

## 2018-03-20 MED ORDER — SEVELAMER CARBONATE 2.4 G PO PACK
2.4000 g | PACK | Freq: Two times a day (BID) | ORAL | Status: DC
  Administered 2018-03-20 – 2018-03-21 (×3): 2.4 g via ORAL
  Filled 2018-03-20 (×4): qty 1

## 2018-03-20 MED ORDER — CINACALCET HCL 30 MG PO TABS
30.0000 mg | ORAL_TABLET | Freq: Every day | ORAL | Status: DC
  Administered 2018-03-20 – 2018-03-21 (×2): 30 mg via ORAL
  Filled 2018-03-20 (×2): qty 1

## 2018-03-20 NOTE — Plan of Care (Signed)
  Problem: Education: Goal: Knowledge of General Education information will improve Outcome: Progressing   Problem: Health Behavior/Discharge Planning: Goal: Ability to manage health-related needs will improve Outcome: Progressing   Problem: Clinical Measurements: Goal: Ability to maintain clinical measurements within normal limits will improve Outcome: Progressing Goal: Respiratory complications will improve Outcome: Progressing Goal: Cardiovascular complication will be avoided Outcome: Progressing   Problem: Activity: Goal: Risk for activity intolerance will decrease Outcome: Progressing   Problem: Pain Managment: Goal: General experience of comfort will improve Outcome: Progressing

## 2018-03-20 NOTE — Progress Notes (Signed)
Chaplain responded to an OR for an AD. Pt was drifting off to sleep as Chaplain offered education on AD. Pt said she was tired. Chaplain offered to return for education,leaving the AD fro review. Chaplain prayed a prayer of healing and comfort.    03/20/18 1000  Clinical Encounter Type  Visited With Patient  Visit Type Initial  Referral From Nurse  Spiritual Encounters  Spiritual Needs Brochure;Prayer

## 2018-03-20 NOTE — Progress Notes (Signed)
Pre-dialysis assessment. See flow sheet for vitals. Patient lying in bed awake. She denies any needs at this time. Will continue to monitor.     03/20/18 1111  Neurological  Level of Consciousness Alert  Orientation Level Oriented X4  Respiratory  Respiratory Pattern Regular;Unlabored  Chest Assessment Chest expansion symmetrical  Bilateral Breath Sounds Diminished  Cardiac  Pulse Regular  Heart Sounds S1, S2  Jugular Venous Distention (JVD) No  ECG Monitor Yes  Cardiac Rhythm NSR;Other (Comment) (occassional PVCs)  Ectopy Unifocal PVC's  Ectopy Frequency Rare  Antiarrhythmic device No  Vascular  R Radial Pulse +2  L Radial Pulse +2  R Dorsalis Pedis Pulse +1  L Dorsalis Pedis Pulse +1  Edema Right lower extremity;Left lower extremity  RLE Edema +1  LLE Edema +1  Integumentary  Integumentary (WDL) WDL  Skin Color Appropriate for ethnicity  Skin Condition Dry  Skin Integrity Intact  Musculoskeletal  Musculoskeletal (WDL) X  Generalized Weakness Yes  Assistive Device None  Gastrointestinal  Bowel Sounds Assessment Active  GU Assessment  Genitourinary (WDL) X  Genitourinary Symptoms Other (Comment) (Hemodialysis patient)  Psychosocial  Psychosocial (WDL) WDL  Patient Behaviors Appropriate for age;Appropriate for situation;Cooperative;Calm

## 2018-03-20 NOTE — ED Notes (Signed)
Patient transported to room 251 by this EDT.

## 2018-03-20 NOTE — Progress Notes (Signed)
See the patient in hemodialysis unit. She has no complaints.  Vital signs are stable. Physical examinations is unremarkable. Chest pain resolved.  Continue dialysis. Continue current treatment.  Possible discharge home tomorrow.  I discussed with the patient, dialysis RN, floor RN.  Time spent 28 minutes.

## 2018-03-20 NOTE — Progress Notes (Signed)
[facility], [place] 03/20/18  Subjective:   Patient is readmitted for Shortness of breath and chest pain She is very sleepy this morning but states that chest pain started at home and radiated to left neck and arm   Objective:  Vital signs in last 24 hours:  Temp:  [97.8 F (36.6 C)-98.2 F (36.8 C)] 97.8 F (36.6 C) (07/02 0815) Pulse Rate:  [59-74] 59 (07/02 0815) Resp:  [17-28] 17 (07/02 0611) BP: (123-155)/(49-76) 123/49 (07/02 0815) SpO2:  [88 %-100 %] 95 % (07/02 0815) Weight:  [99.3 kg (218 lb 14.7 oz)-103.2 kg (227 lb 8 oz)] 103.2 kg (227 lb 8 oz) (07/02 0611)  Weight change:  Filed Weights   03/20/18 0116 03/20/18 0611  Weight: 99.3 kg (218 lb 14.7 oz) 103.2 kg (227 lb 8 oz)    Intake/Output:   No intake or output data in the 24 hours ending 03/20/18 0839   Physical Exam: General:  No acute distress, laying in the bed  HEENT  anicteric, moist oral mucous membranes  Neck  supple   Pulm/lungs  [place] O2, b/l mild basilar crackles  CVS/Heart  regular, no rub or gallop  Abdomen:   Soft, nontender  Extremities:  No peripheral edema  Neurologic:  sleepy but arousable; answers questions  Skin:  No acute rashes  Access:  Left upper extremity AV fistula       Basic Metabolic Panel:  Recent Labs  Lab 03/20/18 0114  NA 140  K 4.9  CL 101  CO2 23  GLUCOSE 126*  BUN 68*  CREATININE 9.92*  CALCIUM 8.8*     CBC: Recent Labs  Lab 03/20/18 0114  WBC 8.4  HGB 8.7*  HCT 26.2*  MCV 93.2  PLT 237      Lab Results  Component Value Date   HEPBSAG Negative 01/11/2018   HEPBSAB Reactive 01/11/2018      Microbiology:  No results found for this or any previous visit (from the past 240 hour(s)).  Coagulation Studies: No results for input(s): LABPROT, INR in the last 72 hours.  Urinalysis: No results for input(s): COLORURINE, LABSPEC, PHURINE, GLUCOSEU, HGBUR, BILIRUBINUR, KETONESUR, PROTEINUR, UROBILINOGEN, NITRITE,  LEUKOCYTESUR in the last 72 hours.  Invalid input(s): APPERANCEUR    Imaging: Dg Chest Port 1 View  Result Date: 03/20/2018 CLINICAL DATA:  Dyspnea EXAM: PORTABLE CHEST 1 VIEW COMPARISON:  03/02/2018 02/26/2018 FINDINGS: Cardiomegaly with vascular congestion and mild pulmonary edema. No pleural effusion. Aortic atherosclerosis. No pneumothorax. IMPRESSION: Cardiomegaly with mild pulmonary edema. Not much interval change compared to prior radiograph. Electronically Signed   By: Donavan Foil M.D.   On: 03/20/2018 01:45     Medications:    . aspirin EC  81 mg Oral Daily  . calcium carbonate  600 mg of elemental calcium Oral Q breakfast  . cinacalcet  30 mg Oral Q breakfast  . clopidogrel  75 mg Oral Daily  . docusate sodium  100 mg Oral BID  . feeding supplement (NEPRO CARB STEADY)  237 mL Oral Q24H  . fluticasone furoate-vilanterol  1 puff Inhalation Daily  . gabapentin  100 mg Oral QHS  . heparin  5,000 Units Subcutaneous Q8H  . pantoprazole  40 mg Oral BID  . rosuvastatin  20 mg Oral Daily  . sevelamer carbonate  2.4 g Oral BID WC  . tiotropium  1 capsule Inhalation Daily  . venlafaxine XR  37.5 mg Oral Q breakfast   acetaminophen **OR** acetaminophen, methocarbamol, nitroGLYCERIN, ondansetron **  OR** ondansetron (ZOFRAN) IV, oxyCODONE  Assessment/ Plan:  67 y.o. African-American female with end stage renal disease on hemodialysis, coronary artery disease status post CABG, hypertension, peripheral vascular disease, diabetes mellitus type 2, hyperlipidemia, bilateral total knee replacement, PD catheter removal June 30, 2017  [facility]/[address]TTS/left AVF/ 100 kg  1.  Shortness of breath,Acute pulmonary edema 2.  End-stage renal disease on hemodialysis  3.  Secondary hyperparathyroidism 4.  Anemia of chronic kidney disease  Plan: Patient states she completed her dialysis treatment on Saturday. Less fluid was removed because of cramps, She left at 101.7. This  admission she si 103 kg. Will try to remove upto 3 kg to get close to EDW of 100 kg EPO with HD Continue home dose of binders for phos binding      LOS: 0 Dorathea Faerber 7/2/20198:39 AM  [place], [place]  Note: This note was prepared with Dragon dictation. Any transcription errors are unintentional

## 2018-03-20 NOTE — Progress Notes (Addendum)
Post Hemo dialysis Assessment    03/20/18 1508  Neurological  Level of Consciousness Alert  Orientation Level Oriented X4  Respiratory  Respiratory Pattern Regular;Unlabored  Chest Assessment Chest expansion symmetrical  Cardiac  Pulse Regular  Heart Sounds S1, S2  Jugular Venous Distention (JVD) No  Cardiac Rhythm NSR  Vascular  R Radial Pulse +2  L Radial Pulse +2  R Dorsalis Pedis Pulse +1  L Dorsalis Pedis Pulse +1  Edema Generalized  RLE Edema +1  LLE Edema +1  Integumentary  Integumentary (WDL) WDL  Skin Color Appropriate for ethnicity  Skin Condition Dry  Skin Integrity Intact  Musculoskeletal  Musculoskeletal (WDL) X  Generalized Weakness Yes  Assistive Device None  Gastrointestinal  Bowel Sounds Assessment Active  GU Assessment  Genitourinary (WDL) X  Genitourinary Symptoms Other (Comment) (Patient states she still makes urine)  Psychosocial  Psychosocial (WDL) WDL  Patient Behaviors Appropriate for situation

## 2018-03-20 NOTE — Care Management Obs Status (Signed)
[place] NOTIFICATION   Patient Details  Name: Kimberly Shaffer MRN: [id number] Date of Birth: 11/06/1950   Medicare Observation Status Notification Given:  Yes    Shelbie Ammons, RN 03/20/2018, 9:19 AM

## 2018-03-20 NOTE — Progress Notes (Signed)
Patient BF removed was 3622 mL and BBP was 75.7. Patient denies any complaints of pain and vitals are stable. Report called to Arlyss Gandy, RN.

## 2018-03-20 NOTE — ED Notes (Signed)
Pt placed on 2L of oxygen due to pt sleeping and getting IV pain medication.

## 2018-03-20 NOTE — H&P (Signed)
Kimberly Shaffer is an 67 y.o. female.   Chief Complaint: Chest pain HPI: The patient with past medical history of throat, CAD status post MI and CABG, and dialysis presents to the emergency department complaining of chest pain.  Patient's pain began at rest.  She admits to some associated shortness of breath but denies nausea, vomiting or diaphoresis.  In the emergency department the patient received morphine which alleviated her pain and shortness of breath.  Chest x-ray showed mild interstitial edema.  Due to mild fluid overload and intermittent ongoing chest pain the emergency department staff called the hospitalist service for admission.  Past Medical History:  Diagnosis Date  . Asthma   . CAD (coronary artery disease)   . Chronic lower back pain   . COPD (chronic obstructive pulmonary disease) ([place])   . Depression   . Diabetes mellitus without complication (HCC)    NIDD  . H/O angina pectoris   . H/O blood clots   . Hypercholesteremia   . Hypertension   . Left rotator cuff tear   . Myocardial infarction ([place])   . Obesity   . Renal insufficiency   . Stroke ([place])   . Vertigo, aural     Past Surgical History:  Procedure Laterality Date  . A/V FISTULAGRAM Left 03/14/2017   Procedure: A/V Fistulagram;  Surgeon: Katha Cabal, MD;  Location: [facility];  Service: Cardiovascular;  Laterality: Left;  . AV FISTULA PLACEMENT Left 11-04-2015  . AV FISTULA PLACEMENT Left 11/04/2015   Procedure: ARTERIOVENOUS (AV) FISTULA CREATION  ( BRACHIAL CEPHALIC );  Surgeon: Katha Cabal, MD;  Location: [facility];  Service: Vascular;  Laterality: Left;  . CARDIAC CATHETERIZATION    . CARDIAC CATHETERIZATION Left 07/19/2016   Procedure: Left Heart Cath and Coronary Angiography;  Surgeon: Corey Skains, MD;  Location: [facility];  Service: Cardiovascular;  Laterality: Left;  . CORONARY ARTERY BYPASS GRAFT  2012  . CORONARY STENT PLACEMENT    . JOINT REPLACEMENT    .  KNEE SURGERY Bilateral   . LEFT HEART CATH AND CORONARY ANGIOGRAPHY N/A 12/25/2017   Procedure: LEFT HEART CATH AND CORONARY ANGIOGRAPHY;  Surgeon: Teodoro Spray, MD;  Location: [facility];  Service: Cardiovascular;  Laterality: N/A;  . PERIPHERAL VASCULAR CATHETERIZATION N/A 11/13/2015   Procedure: Dialysis/Perma Catheter Insertion;  Surgeon: Katha Cabal, MD;  Location: [facility];  Service: Cardiovascular;  Laterality: N/A;  . PERIPHERAL VASCULAR CATHETERIZATION N/A 12/04/2015   Procedure: Dialysis/Perma Catheter Insertion;  Surgeon: Katha Cabal, MD;  Location: [facility];  Service: Cardiovascular;  Laterality: N/A;  . PERIPHERAL VASCULAR CATHETERIZATION Left 12/31/2015   Procedure: A/V Shuntogram/Fistulagram;  Surgeon: Algernon Huxley, MD;  Location: [facility];  Service: Cardiovascular;  Laterality: Left;  . PERIPHERAL VASCULAR CATHETERIZATION N/A 12/31/2015   Procedure: A/V Shunt Intervention;  Surgeon: Algernon Huxley, MD;  Location: [facility];  Service: Cardiovascular;  Laterality: N/A;  . PERIPHERAL VASCULAR CATHETERIZATION Left 02/25/2016   Procedure: A/V Shuntogram/Fistulagram;  Surgeon: Algernon Huxley, MD;  Location: [facility];  Service: Cardiovascular;  Laterality: Left;  . PERIPHERAL VASCULAR CATHETERIZATION N/A 03/18/2016   Procedure: Dialysis/Perma Catheter Removal;  Surgeon: Katha Cabal, MD;  Location: [facility];  Service: Cardiovascular;  Laterality: N/A;  . REMOVAL OF A DIALYSIS CATHETER N/A 06/30/2017   Procedure: REMOVAL OF A DIALYSIS CATHETER;  Surgeon: Katha Cabal, MD;  Location: [facility];  Service: Vascular;  Laterality: N/A;  . REPLACEMENT TOTAL KNEE BILATERAL Bilateral     Family History  Problem Relation Age of Onset  . Cancer Mother   . Cancer Father   . Diabetes Brother   . Heart disease Brother    Social History:  reports that she has been smoking cigarettes.  She has been smoking  about 0.25 packs per day. She has never used smokeless tobacco. She reports that she does not drink alcohol or use drugs.  Allergies:  Allergies  Allergen Reactions  . Nystatin Hives and Itching  . Sulfa Antibiotics Swelling, Hives and Rash  . Niaspan [Niacin] Other (See Comments) and Hives    Medications Prior to Admission  Medication Sig Dispense Refill  . albuterol (PROVENTIL HFA;VENTOLIN HFA) 108 (90 BASE) MCG/ACT inhaler Inhale 2 puffs into the lungs every 6 (six) hours as needed for wheezing or shortness of breath.     Marland Kitchen aspirin EC 81 MG tablet Take 81 mg by mouth daily.    . calcium carbonate (OSCAL) 1500 (600 Ca) MG TABS tablet Take 600 mg of elemental calcium by mouth daily with breakfast.    . clopidogrel (PLAVIX) 75 MG tablet Take 1 tablet by mouth daily.  3  . gabapentin (NEURONTIN) 100 MG capsule Take 100 mg by mouth at bedtime.    . methocarbamol (ROBAXIN) 750 MG tablet Take 750 mg by mouth 2 (two) times daily as needed for muscle spasms.     . nitroGLYCERIN (NITROLINGUAL) 0.4 MG/SPRAY spray Place 2 sprays under the tongue every 5 (five) minutes x 3 doses as needed for chest pain.     . Nutritional Supplements (FEEDING SUPPLEMENT, NEPRO CARB STEADY,) LIQD Take 237 mLs by mouth daily. 15 Can 0  . Oxycodone HCl 10 MG TABS Take 10 mg by mouth every 6 (six) hours as needed (pain).     . pantoprazole (PROTONIX) 40 MG tablet Take 40 mg by mouth 2 (two) times daily.    Marland Kitchen RENVELA 2.4 g PACK Take 2.4 g by mouth 2 (two) times daily. 30 each 0  . rosuvastatin (CRESTOR) 20 MG tablet Take 1 tablet (20 mg total) by mouth daily. 30 tablet 1  . SENSIPAR 30 MG tablet Take 1 tablet (30 mg total) by mouth daily. 60 tablet 0  . SPIRIVA HANDIHALER 18 MCG inhalation capsule Place 1 capsule into inhaler and inhale daily.    . SYMBICORT 160-4.5 MCG/ACT inhaler Inhale 2 puffs into the lungs 2 (two) times daily.    Marland Kitchen venlafaxine XR (EFFEXOR-XR) 37.5 MG 24 hr capsule Take 37.5 mg by mouth daily with  breakfast.    . ipratropium (ATROVENT) 0.02 % nebulizer solution Take 2.5 mLs (0.5 mg total) by nebulization every 6 (six) hours as needed for wheezing or shortness of breath. (Patient not taking: Reported on 02/26/2018) 125 mL 5  . meclizine (ANTIVERT) 12.5 MG tablet Take 1 tablet (12.5 mg total) by mouth 3 (three) times daily as needed for dizziness or nausea. (Patient not taking: Reported on 02/26/2018) 30 tablet 1  . nicotine (NICODERM CQ - DOSED IN MG/24 HOURS) 21 mg/24hr patch Place 1 patch (21 mg total) onto the skin daily. (Patient not taking: Reported on 01/11/2018) 28 patch 0  . Nutritional Supplements (FEEDING SUPPLEMENT, NEPRO CARB STEADY,) LIQD Take 237 mLs by mouth 2 (two) times daily between meals. (Patient not taking: Reported on 02/26/2018) 30 Can 0    Results for orders placed or performed during the  hospital encounter of 03/20/18 (from the past 48 hour(s))  Basic metabolic panel     Status: Abnormal   Collection Time: 03/20/18  1:14 AM  Result Value Ref Range   Sodium 140 135 - 145 mmol/L   Potassium 4.9 3.5 - 5.1 mmol/L   Chloride 101 98 - 111 mmol/L    Comment: Please note change in reference range.   CO2 23 22 - 32 mmol/L   Glucose, Bld 126 (H) 70 - 99 mg/dL    Comment: Please note change in reference range.   BUN 68 (H) 8 - 23 mg/dL    Comment: Please note change in reference range.   Creatinine, Ser 9.92 (H) 0.44 - 1.00 mg/dL   Calcium 8.8 (L) 8.9 - 10.3 mg/dL   GFR calc non Af Amer 4 (L) >60 mL/min   GFR calc Af Amer 4 (L) >60 mL/min    Comment: (NOTE) The eGFR has been calculated using the CKD EPI equation. This calculation has not been validated in all clinical situations. eGFR's persistently <60 mL/min signify possible Chronic Kidney Disease.    Anion gap 16 (H) 5 - 15    Comment: Performed at [facility], [address] [place], [place] [address]  CBC     Status: Abnormal   Collection Time: 03/20/18  1:14 AM  Result Value Ref Range   WBC 8.4 3.6  - 11.0 K/uL   RBC 2.81 (L) 3.80 - 5.20 MIL/uL   Hemoglobin 8.7 (L) 12.0 - 16.0 g/dL   HCT 26.2 (L) 35.0 - 47.0 %   MCV 93.2 80.0 - 100.0 fL   MCH 30.9 26.0 - 34.0 pg   MCHC 33.1 32.0 - 36.0 g/dL   RDW 17.1 (H) 11.5 - 14.5 %   Platelets 237 150 - 440 K/uL    Comment: Performed at [facility], [address] [place], [place] [address]  Troponin I     Status: Abnormal   Collection Time: 03/20/18  1:14 AM  Result Value Ref Range   Troponin I 0.03 (HH) <0.03 ng/mL    Comment: CRITICAL RESULT CALLED TO, READ BACK BY AND VERIFIED WITH KASEY ROBERTS '@0145'$  03/20/18 [facility] Performed at [facility], [address] [place], [place] [address]   Troponin I     Status: Abnormal   Collection Time: 03/20/18  4:18 AM  Result Value Ref Range   Troponin I 0.06 (HH) <0.03 ng/mL    Comment: CRITICAL VALUE NOTED. VALUE IS CONSISTENT WITH PREVIOUSLY REPORTED/CALLED VALUE / [facility] Performed at [facility], [address] [place],  [address]    Dg Chest Port 1 View  Result Date: 03/20/2018 CLINICAL DATA:  Dyspnea EXAM: PORTABLE CHEST 1 VIEW COMPARISON:  03/02/2018 02/26/2018 FINDINGS: Cardiomegaly with vascular congestion and mild pulmonary edema. No pleural effusion. Aortic atherosclerosis. No pneumothorax. IMPRESSION: Cardiomegaly with mild pulmonary edema. Not much interval change compared to prior radiograph. Electronically Signed   By: Donavan Foil M.D.   On: 03/20/2018 01:45    Review of Systems  Constitutional: Negative for chills and fever.  HENT: Negative for sore throat and tinnitus.   Eyes: Negative for blurred vision and redness.  Respiratory: Positive for shortness of breath. Negative for cough.   Cardiovascular: Positive for chest pain. Negative for palpitations, orthopnea and PND.  Gastrointestinal: Negative for abdominal pain, diarrhea, nausea and vomiting.  Genitourinary: Negative for dysuria, frequency and urgency.  Musculoskeletal: Negative for joint  pain and myalgias.  Skin: Negative for  rash.       No lesions  Neurological: Negative for speech change, focal weakness and weakness.  Endo/Heme/Allergies: Does not bruise/bleed easily.       No temperature intolerance  Psychiatric/Behavioral: Negative for depression and suicidal ideas.    Blood pressure 131/67, pulse 62, temperature 98.2 F (36.8 C), temperature source Oral, resp. rate 17, height '5\' 6"'$  (1.676 m), weight 103.2 kg (227 lb 8 oz), SpO2 95 %. Physical Exam  Vitals reviewed. Constitutional: She is oriented to person, place, and time. She appears well-developed and well-nourished. No distress.  HENT:  Head: Normocephalic and atraumatic.  Mouth/Throat: Oropharynx is clear and moist.  Eyes: Pupils are equal, round, and reactive to light. Conjunctivae and EOM are normal. No scleral icterus.  Neck: Normal range of motion. Neck supple. No JVD present. No tracheal deviation present. No thyromegaly present.  Cardiovascular: Normal rate, regular rhythm and normal heart sounds. Exam reveals no gallop and no friction rub.  No murmur heard. Respiratory: Effort normal and breath sounds normal.  GI: Soft. Bowel sounds are normal. She exhibits no distension. There is no tenderness.  Genitourinary:  Genitourinary Comments: Deferred  Musculoskeletal: Normal range of motion. She exhibits no edema.  Lymphadenopathy:    She has no cervical adenopathy.  Neurological: She is alert and oriented to person, place, and time. No cranial nerve deficit. She exhibits normal muscle tone.  Skin: Skin is warm and dry. No rash noted. No erythema.  Psychiatric: She has a normal mood and affect. Her behavior is normal. Judgment and thought content normal.     Assessment/Plan This is a 67 year old female admitted for chest pain. 1.  Chest pain: Solved; secondary to pulmonary congestion and mild interstitial edema.  Patient is well-known to the hospitalist service and this presentation of pain usually  resolves after dialysis.  Will need dialysis for diuresis.  Continue aspirin and Plavix. 2.  ESRD: On dialysis; consult nephrology.  Continue Renvela and Sensipar 3.  Hypertension: Controlled 4.  COPD: Stable; continue Spiriva as well as inhaled corticosteroid 5.  Lipidemia: Continue statin therapy 6.  Depression: Continue Effexor 7.  DVT prophylaxis: Heparin 8.  GI prophylaxis: Resolved per home regimen The patient is a full code.  Time spent on admission orders and patient care approximately 45 minutes  Harrie Foreman, MD 03/20/2018, 7:21 AM

## 2018-03-20 NOTE — Care Management Note (Signed)
Case Management Note  Patient Details  Name: Kimberly Shaffer MRN: [id number] Date of Birth: 1951-07-18  Subjective/Objective:   Admitted to [facility] under observation status with the diagnosis of chest pain. Discharged from this facility 03/03/18. Lives at home with quad son. Daughter is Kimberly Shaffer [phone]). Sees Dr, Daryel November at [facility]. Prescriptions are filled at [facility] on [address]  Currently with [facility] for services. No skilled nursing. No home oxygen. Rolling walker in the home. Established dialysis x 4 years at [facility] on [address] on [address]. Takes care of all basic activities of daily living herself, daughter helps with errands. Good appetite Family will transport.                Action/Plan: Will update Elvera Bicker with [facility].  Will need resumption of services for [facility] when discharged. Floydene Flock, [facility] representative updated.   Expected Discharge Date:                  Expected Discharge Plan:     In-House Referral:     Discharge planning Services     Post Acute Care Choice:    Choice offered to:     DME Arranged:    DME Agency:     HH Arranged:    HH Agency:     Status of Service:     If discussed at [employer], dates discussed:    Additional Comments:  Shelbie Ammons, RN MSN CCM Care Management [phone] 03/20/2018, 9:06 AM

## 2018-03-20 NOTE — ED Triage Notes (Signed)
Pt BIB [facility] with reports of CP and SHOB since 10pm. EMS reports extensive cardiac hx including CAD, MI, bypass and stroke. Pt also dialysis pt Tues/Thursday and Saturday. Pt A&O at this time.

## 2018-03-21 LAB — GLUCOSE, CAPILLARY: Glucose-Capillary: 83 mg/dL (ref 70–99)

## 2018-03-21 MED ORDER — TORSEMIDE 100 MG PO TABS: 100.0000 mg | ORAL_TABLET | Freq: Every day | ORAL | 0 refills | Status: DC

## 2018-03-21 NOTE — Discharge Summary (Signed)
[place] [facility] [place] NAME: Kimberly Shaffer    MR#:  [id number]  DATE OF BIRTH:  30-Jun-1951  DATE OF ADMISSION:  03/20/2018   ADMITTING PHYSICIAN: Harrie Foreman, MD  DATE OF DISCHARGE: 03/21/2018 10:08 AM  PRIMARY CARE PHYSICIAN: [facility]   ADMISSION DIAGNOSIS:  Chest Pain Diff Breathing DISCHARGE DIAGNOSIS:  Active Problems:   Chest pain  SECONDARY DIAGNOSIS:   Past Medical History:  Diagnosis Date  . Asthma   . CAD (coronary artery disease)   . Chronic lower back pain   . COPD (chronic obstructive pulmonary disease) ([place])   . Depression   . Diabetes mellitus without complication (HCC)    NIDD  . H/O angina pectoris   . H/O blood clots   . Hypercholesteremia   . Hypertension   . Left rotator cuff tear   . Myocardial infarction ([place])   . Obesity   . Renal insufficiency   . Stroke ([place])   . Vertigo, aural    HOSPITAL COURSE:  This is a 67 year old female admitted for chest pain. 1.  Chest pain: Solved; secondary to pulmonary congestion and mild interstitial edema.  Continue aspirin and Plavix. 2.  ESRD: On dialysis; the patient got hemodialysis yesterday. Continue Renvela and Sensipar 3.  Hypertension: Controlled 4.  COPD: Stable; continue Spiriva as well as inhaled corticosteroid 5.  Lipidemia: Continue statin therapy 6.  Depression: Continue Effexor I discussed with Dr. Candiss Norse. DISCHARGE CONDITIONS:  Stable, discharge to home today. CONSULTS OBTAINED:  Treatment Team:  Murlean Iba, MD DRUG ALLERGIES:   Allergies  Allergen Reactions  . Nystatin Hives and Itching  . Sulfa Antibiotics Swelling, Hives and Rash  . Niaspan [Niacin] Other (See Comments) and Hives   DISCHARGE MEDICATIONS:   Allergies as of 03/21/2018      Reactions   Nystatin Hives, Itching   Sulfa Antibiotics Swelling, Hives, Rash   Niaspan [niacin] Other (See Comments), Hives      Medication List    TAKE these medications     albuterol 108 (90 Base) MCG/ACT inhaler Commonly known as:  PROVENTIL HFA;VENTOLIN HFA Inhale 2 puffs into the lungs every 6 (six) hours as needed for wheezing or shortness of breath.   aspirin EC 81 MG tablet Take 81 mg by mouth daily.   calcium carbonate 1500 (600 Ca) MG Tabs tablet Commonly known as:  OSCAL Take 600 mg of elemental calcium by mouth daily with breakfast.   clopidogrel 75 MG tablet Commonly known as:  PLAVIX Take 1 tablet by mouth daily.   feeding supplement (NEPRO CARB STEADY) Liqd Take 237 mLs by mouth daily.   gabapentin 100 MG capsule Commonly known as:  NEURONTIN Take 100 mg by mouth at bedtime.   methocarbamol 750 MG tablet Commonly known as:  ROBAXIN Take 750 mg by mouth 2 (two) times daily as needed for muscle spasms.   nitroGLYCERIN 0.4 MG/SPRAY spray Commonly known as:  NITROLINGUAL Place 2 sprays under the tongue every 5 (five) minutes x 3 doses as needed for chest pain.   Oxycodone HCl 10 MG Tabs Take 10 mg by mouth every 6 (six) hours as needed (pain).   pantoprazole 40 MG tablet Commonly known as:  PROTONIX Take 40 mg by mouth 2 (two) times daily.   RENVELA 2.4 g Pack Generic drug:  sevelamer carbonate Take 2.4 g by mouth 2 (two) times daily.   rosuvastatin 20 MG tablet Commonly known as:  CRESTOR Take  1 tablet (20 mg total) by mouth daily.   SENSIPAR 30 MG tablet Generic drug:  cinacalcet Take 1 tablet (30 mg total) by mouth daily.   SPIRIVA HANDIHALER 18 MCG inhalation capsule Generic drug:  tiotropium Place 1 capsule into inhaler and inhale daily.   SYMBICORT 160-4.5 MCG/ACT inhaler Generic drug:  budesonide-formoterol Inhale 2 puffs into the lungs 2 (two) times daily.   torsemide 100 MG tablet Commonly known as:  DEMADEX Take 1 tablet (100 mg total) by mouth daily.   venlafaxine XR 37.5 MG 24 hr capsule Commonly known as:  EFFEXOR-XR Take 37.5 mg by mouth daily with breakfast.        DISCHARGE INSTRUCTIONS:   See AVS  If you experience worsening of your admission symptoms, develop shortness of breath, life threatening emergency, suicidal or homicidal thoughts you must seek medical attention immediately by calling 911 or calling your MD immediately  if symptoms less severe.  You Must read complete instructions/literature along with all the possible adverse reactions/side effects for all the Medicines you take and that have been prescribed to you. Take any new Medicines after you have completely understood and accpet all the possible adverse reactions/side effects.   Please note  You were cared for by a hospitalist during your hospital stay. If you have any questions about your discharge medications or the care you received while you were in the hospital after you are discharged, you can call the unit and asked to speak with the hospitalist on call if the hospitalist that took care of you is not available. Once you are discharged, your primary care physician will handle any further medical issues. Please note that NO REFILLS for any discharge medications will be authorized once you are discharged, as it is imperative that you return to your primary care physician (or establish a relationship with a primary care physician if you do not have one) for your aftercare needs so that they can reassess your need for medications and monitor your lab values.    On the day of Discharge:  VITAL SIGNS:  Blood pressure (!) 142/96, pulse 69, temperature 98.3 F (36.8 C), temperature source Oral, resp. rate 18, height 5\' 6"  (1.676 m), weight 221 lb 8 oz (100.5 kg), SpO2 98 %. PHYSICAL EXAMINATION:  GENERAL:  67 y.o.-year-old patient lying in the bed with no acute distress.  EYES: Pupils equal, round, reactive to light and accommodation. No scleral icterus. Extraocular muscles intact.  HEENT: Head atraumatic, normocephalic. Oropharynx and nasopharynx clear.  NECK:  Supple, no jugular venous distention. No thyroid  enlargement, no tenderness.  LUNGS: Normal breath sounds bilaterally, no wheezing, rales,rhonchi or crepitation. No use of accessory muscles of respiration.  CARDIOVASCULAR: S1, S2 normal. No murmurs, rubs, or gallops.  ABDOMEN: Soft, non-tender, non-distended. Bowel sounds present. No organomegaly or mass.  EXTREMITIES: No pedal edema, cyanosis, or clubbing.  NEUROLOGIC: Cranial nerves II through XII are intact. Muscle strength 5/5 in all extremities. Sensation intact. Gait not checked.  PSYCHIATRIC: The patient is alert and oriented x 3.  SKIN: No obvious rash, lesion, or ulcer.  DATA REVIEW:   CBC Recent Labs  Lab 03/20/18 0114  WBC 8.4  HGB 8.7*  HCT 26.2*  PLT 237    Chemistries  Recent Labs  Lab 03/20/18 0114  NA 140  K 4.9  CL 101  CO2 23  GLUCOSE 126*  BUN 68*  CREATININE 9.92*  CALCIUM 8.8*     Microbiology Results  Results for orders placed  or performed during the hospital encounter of 02/26/18  MRSA PCR Screening     Status: None   Collection Time: 02/26/18  2:38 PM  Result Value Ref Range Status   MRSA by PCR NEGATIVE NEGATIVE Final    Comment:        The GeneXpert MRSA Assay (FDA approved for NASAL specimens only), is one component of a comprehensive MRSA colonization surveillance program. It is not intended to diagnose MRSA infection nor to guide or monitor treatment for MRSA infections. Performed at [facility], [address], [place], [place] [address]     RADIOLOGY:  No results found.   Management plans discussed with the patient, family and they are in agreement.  CODE STATUS: Full Code   TOTAL TIME TAKING CARE OF THIS PATIENT: 26 minutes.    Demetrios Loll M.D on 03/21/2018 at 12:16 PM  Between 7am to 6pm - Pager - [phone]  After 6pm go to [email or web address] - Proofreader  [facility]  Office  [phone]  CC: Primary care physician; [facility]   Note: This dictation  was prepared with Dragon dictation along with smaller phrase technology. Any transcriptional errors that result from this process are unintentional.

## 2018-03-21 NOTE — Progress Notes (Signed)
Pt discharged home, IV removed VSS, no complaints patient requested dialysis diet information, provided. Pt educated on new medications and follow up appointment schedule.

## 2018-03-21 NOTE — Plan of Care (Signed)
  Problem: Clinical Measurements: Goal: Diagnostic test results will improve Outcome: Adequate for Discharge   Problem: Clinical Measurements: Goal: Respiratory complications will improve Outcome: Adequate for Discharge   Problem: Clinical Measurements: Goal: Cardiovascular complication will be avoided Outcome: Adequate for Discharge   Problem: Pain Managment: Goal: General experience of comfort will improve Outcome: Adequate for Discharge

## 2018-03-21 NOTE — Care Management (Signed)
Kimberly Shaffer with [facility] notified of discharge.

## 2018-03-21 NOTE — Discharge Instructions (Signed)
Renal and ADA diet. Fluid restriction.

## 2018-03-21 NOTE — Progress Notes (Signed)
[facility], [place] 03/21/18  Subjective:   Patient is readmitted for Shortness of breath and chest pain  3.6 L removed with HD yesterday Currently on room air Feels better Post HD weight 100.5 kg  Objective:  Vital signs in last 24 hours:  Temp:  [97.8 F (36.6 C)-99.1 F (37.3 C)] 98.3 F (36.8 C) (07/03 0721) Pulse Rate:  [51-75] 69 (07/03 0721) Resp:  [13-24] 18 (07/03 0721) BP: (85-162)/(52-121) 142/96 (07/03 0721) SpO2:  [90 %-100 %] 98 % (07/03 0721) Weight:  [100.5 kg (221 lb 8 oz)] 100.5 kg (221 lb 8 oz) (07/03 0405)  Weight change: 1.172 kg (2 lb 9.3 oz) Filed Weights   03/20/18 0116 03/20/18 0611 03/21/18 0405  Weight: 99.3 kg (218 lb 14.7 oz) 103.2 kg (227 lb 8 oz) 100.5 kg (221 lb 8 oz)    Intake/Output:    Intake/Output Summary (Last 24 hours) at 03/21/2018 1104 Last data filed at 03/21/2018 0000 Gross per 24 hour  Intake -  Output 3122 ml  Net -3122 ml     Physical Exam: General:  No acute distress, sitting up in bed  HEENT  anicteric, moist oral mucous membranes  Neck  supple   Pulm/lungs  scattered rhonchi otherwise clear b/l, room air  CVS/Heart  regular, no rub or gallop  Abdomen:   Soft, nontender  Extremities:  No peripheral edema  Neurologic:  Alert and oriented  Skin:  No acute rashes  Access:  Left upper extremity AV fistula       Basic Metabolic Panel:  Recent Labs  Lab 03/20/18 0114  NA 140  K 4.9  CL 101  CO2 23  GLUCOSE 126*  BUN 68*  CREATININE 9.92*  CALCIUM 8.8*     CBC: Recent Labs  Lab 03/20/18 0114  WBC 8.4  HGB 8.7*  HCT 26.2*  MCV 93.2  PLT 237      Lab Results  Component Value Date   HEPBSAG Negative 01/11/2018   HEPBSAB Reactive 01/11/2018      Microbiology:  No results found for this or any previous visit (from the past 240 hour(s)).  Coagulation Studies: No results for input(s): LABPROT, INR in the last 72 hours.  Urinalysis: No results for input(s):  COLORURINE, LABSPEC, PHURINE, GLUCOSEU, HGBUR, BILIRUBINUR, KETONESUR, PROTEINUR, UROBILINOGEN, NITRITE, LEUKOCYTESUR in the last 72 hours.  Invalid input(s): APPERANCEUR    Imaging: Dg Chest Port 1 View  Result Date: 03/20/2018 CLINICAL DATA:  Dyspnea EXAM: PORTABLE CHEST 1 VIEW COMPARISON:  03/02/2018 02/26/2018 FINDINGS: Cardiomegaly with vascular congestion and mild pulmonary edema. No pleural effusion. Aortic atherosclerosis. No pneumothorax. IMPRESSION: Cardiomegaly with mild pulmonary edema. Not much interval change compared to prior radiograph. Electronically Signed   By: Donavan Foil M.D.   On: 03/20/2018 01:45     Medications:    . aspirin EC  81 mg Oral Daily  . calcium carbonate  600 mg of elemental calcium Oral Q breakfast  . cinacalcet  30 mg Oral Q breakfast  . clopidogrel  75 mg Oral Daily  . docusate sodium  100 mg Oral BID  . feeding supplement (NEPRO CARB STEADY)  237 mL Oral Q24H  . fluticasone furoate-vilanterol  1 puff Inhalation Daily  . gabapentin  100 mg Oral QHS  . heparin  5,000 Units Subcutaneous Q8H  . insulin aspart  0-5 Units Subcutaneous QHS  . insulin aspart  0-9 Units Subcutaneous TID WC  . pantoprazole  40 mg Oral BID  .  rosuvastatin  20 mg Oral Daily  . sevelamer carbonate  2.4 g Oral BID WC  . tiotropium  1 capsule Inhalation Daily  . venlafaxine XR  37.5 mg Oral Q breakfast   acetaminophen **OR** acetaminophen, methocarbamol, nitroGLYCERIN, ondansetron **OR** ondansetron (ZOFRAN) IV, oxyCODONE  Assessment/ Plan:  67 y.o. African-American female with end stage renal disease on hemodialysis, coronary artery disease status post CABG, hypertension, peripheral vascular disease, diabetes mellitus type 2, hyperlipidemia, bilateral total knee replacement, PD catheter removal June 30, 2017  [facility]TTS/left AVF/ 100.5 kg  1.  Shortness of breath,Acute pulmonary edema 2.  End-stage renal disease on hemodialysis  3.  Secondary  hyperparathyroidism 4.  Anemia of chronic kidney disease  Plan: Outpatient dry weight change to 100.5 mg EPO with HD Continue home dose of binders for phos binding Okay to discharge from renal standpoint.      LOS: 0 Javious Hallisey Candiss Norse 7/3/201911:04 AM  [place], [place]  Note: This note was prepared with Dragon dictation. Any transcription errors are unintentional

## 2018-03-23 ENCOUNTER — Emergency Department: Payer: Medicare Other

## 2018-03-23 ENCOUNTER — Encounter: Payer: Self-pay | Admitting: Emergency Medicine

## 2018-03-23 ENCOUNTER — Encounter
Admission: EM | Disposition: A | Discharge status: Other Acute Inpatient Hospital | Payer: Self-pay | Source: Home / Self Care | Attending: Internal Medicine

## 2018-03-23 ENCOUNTER — Inpatient Hospital Stay
Admit: 2018-03-23 | Discharge: 2018-03-23 | Disposition: A | Discharge status: Home / Self Care | Payer: Medicare Other | Attending: Cardiovascular Disease | Admitting: Cardiovascular Disease

## 2018-03-23 ENCOUNTER — Other Ambulatory Visit: Payer: Self-pay

## 2018-03-23 ENCOUNTER — Inpatient Hospital Stay
Admission: EM | Admit: 2018-03-23 | Discharge: 2018-03-23 | DRG: 280 | Disposition: A | Discharge status: Other Acute Inpatient Hospital | Payer: Medicare Other | Attending: Internal Medicine | Admitting: Internal Medicine

## 2018-03-23 DIAGNOSIS — E7800 Pure hypercholesterolemia, unspecified: Secondary | ICD-10-CM | POA: Diagnosis present

## 2018-03-23 DIAGNOSIS — Z992 Dependence on renal dialysis: Secondary | ICD-10-CM

## 2018-03-23 DIAGNOSIS — N186 End stage renal disease: Secondary | ICD-10-CM | POA: Diagnosis present

## 2018-03-23 DIAGNOSIS — Z7902 Long term (current) use of antithrombotics/antiplatelets: Secondary | ICD-10-CM

## 2018-03-23 DIAGNOSIS — R079 Chest pain, unspecified: Secondary | ICD-10-CM | POA: Diagnosis present

## 2018-03-23 DIAGNOSIS — Z882 Allergy status to sulfonamides status: Secondary | ICD-10-CM

## 2018-03-23 DIAGNOSIS — E1122 Type 2 diabetes mellitus with diabetic chronic kidney disease: Secondary | ICD-10-CM | POA: Diagnosis present

## 2018-03-23 DIAGNOSIS — I214 Non-ST elevation (NSTEMI) myocardial infarction: Secondary | ICD-10-CM | POA: Diagnosis present

## 2018-03-23 DIAGNOSIS — Z96653 Presence of artificial knee joint, bilateral: Secondary | ICD-10-CM | POA: Diagnosis present

## 2018-03-23 DIAGNOSIS — G8929 Other chronic pain: Secondary | ICD-10-CM | POA: Diagnosis present

## 2018-03-23 DIAGNOSIS — Z955 Presence of coronary angioplasty implant and graft: Secondary | ICD-10-CM | POA: Diagnosis not present

## 2018-03-23 DIAGNOSIS — I5032 Chronic diastolic (congestive) heart failure: Secondary | ICD-10-CM | POA: Diagnosis present

## 2018-03-23 DIAGNOSIS — G4733 Obstructive sleep apnea (adult) (pediatric): Secondary | ICD-10-CM | POA: Diagnosis present

## 2018-03-23 DIAGNOSIS — I2510 Atherosclerotic heart disease of native coronary artery without angina pectoris: Secondary | ICD-10-CM | POA: Diagnosis present

## 2018-03-23 DIAGNOSIS — Z8673 Personal history of transient ischemic attack (TIA), and cerebral infarction without residual deficits: Secondary | ICD-10-CM

## 2018-03-23 DIAGNOSIS — I132 Hypertensive heart and chronic kidney disease with heart failure and with stage 5 chronic kidney disease, or end stage renal disease: Secondary | ICD-10-CM | POA: Diagnosis present

## 2018-03-23 DIAGNOSIS — E1140 Type 2 diabetes mellitus with diabetic neuropathy, unspecified: Secondary | ICD-10-CM | POA: Diagnosis present

## 2018-03-23 DIAGNOSIS — F329 Major depressive disorder, single episode, unspecified: Secondary | ICD-10-CM | POA: Diagnosis present

## 2018-03-23 DIAGNOSIS — Z7982 Long term (current) use of aspirin: Secondary | ICD-10-CM

## 2018-03-23 DIAGNOSIS — M545 Low back pain: Secondary | ICD-10-CM | POA: Diagnosis present

## 2018-03-23 DIAGNOSIS — N2581 Secondary hyperparathyroidism of renal origin: Secondary | ICD-10-CM | POA: Diagnosis present

## 2018-03-23 DIAGNOSIS — Z951 Presence of aortocoronary bypass graft: Secondary | ICD-10-CM

## 2018-03-23 DIAGNOSIS — E669 Obesity, unspecified: Secondary | ICD-10-CM | POA: Diagnosis present

## 2018-03-23 DIAGNOSIS — D631 Anemia in chronic kidney disease: Secondary | ICD-10-CM | POA: Diagnosis present

## 2018-03-23 DIAGNOSIS — E782 Mixed hyperlipidemia: Secondary | ICD-10-CM | POA: Diagnosis present

## 2018-03-23 DIAGNOSIS — Z833 Family history of diabetes mellitus: Secondary | ICD-10-CM | POA: Diagnosis not present

## 2018-03-23 DIAGNOSIS — Z8249 Family history of ischemic heart disease and other diseases of the circulatory system: Secondary | ICD-10-CM

## 2018-03-23 DIAGNOSIS — K219 Gastro-esophageal reflux disease without esophagitis: Secondary | ICD-10-CM | POA: Diagnosis present

## 2018-03-23 DIAGNOSIS — E1165 Type 2 diabetes mellitus with hyperglycemia: Secondary | ICD-10-CM | POA: Diagnosis present

## 2018-03-23 DIAGNOSIS — F17210 Nicotine dependence, cigarettes, uncomplicated: Secondary | ICD-10-CM | POA: Diagnosis present

## 2018-03-23 DIAGNOSIS — Z6836 Body mass index (BMI) 36.0-36.9, adult: Secondary | ICD-10-CM

## 2018-03-23 DIAGNOSIS — I252 Old myocardial infarction: Secondary | ICD-10-CM | POA: Diagnosis not present

## 2018-03-23 DIAGNOSIS — J449 Chronic obstructive pulmonary disease, unspecified: Secondary | ICD-10-CM | POA: Diagnosis present

## 2018-03-23 DIAGNOSIS — Z888 Allergy status to other drugs, medicaments and biological substances status: Secondary | ICD-10-CM

## 2018-03-23 DIAGNOSIS — R9431 Abnormal electrocardiogram [ECG] [EKG]: Secondary | ICD-10-CM

## 2018-03-23 DIAGNOSIS — Z809 Family history of malignant neoplasm, unspecified: Secondary | ICD-10-CM

## 2018-03-23 DIAGNOSIS — Z66 Do not resuscitate: Secondary | ICD-10-CM | POA: Diagnosis not present

## 2018-03-23 DIAGNOSIS — E785 Hyperlipidemia, unspecified: Secondary | ICD-10-CM | POA: Diagnosis present

## 2018-03-23 HISTORY — PX: LEFT HEART CATH AND CORONARY ANGIOGRAPHY: CATH118249

## 2018-03-23 LAB — COMPREHENSIVE METABOLIC PANEL
ALK PHOS: 53 U/L (ref 38–126)
ALT: 9 U/L (ref 0–44)
ANION GAP: 12 (ref 5–15)
AST: 19 U/L (ref 15–41)
Albumin: 3.5 g/dL (ref 3.5–5.0)
BUN: 40 mg/dL — ABNORMAL HIGH (ref 8–23)
CALCIUM: 9 mg/dL (ref 8.9–10.3)
CHLORIDE: 103 mmol/L (ref 98–111)
CO2: 24 mmol/L (ref 22–32)
CREATININE: 6.15 mg/dL — AB (ref 0.44–1.00)
GFR, EST AFRICAN AMERICAN: 7 mL/min — AB (ref >60)
GFR, EST NON AFRICAN AMERICAN: 6 mL/min — AB (ref >60)
Glucose, Bld: 116 mg/dL — ABNORMAL HIGH (ref 70–99)
Potassium: 5 mmol/L (ref 3.5–5.1)
SODIUM: 139 mmol/L (ref 135–145)
Total Bilirubin: 0.5 mg/dL (ref 0.3–1.2)
Total Protein: 8.2 g/dL — ABNORMAL HIGH (ref 6.5–8.1)

## 2018-03-23 LAB — CBC WITH DIFFERENTIAL/PLATELET
Basophils Absolute: 0 10*3/uL (ref 0–0.1)
Basophils Relative: 0 %
EOS ABS: 0.1 10*3/uL (ref 0–0.7)
EOS PCT: 2 %
HEMATOCRIT: 27.1 % — AB (ref 35.0–47.0)
HEMOGLOBIN: 8.9 g/dL — AB (ref 12.0–16.0)
Lymphocytes Relative: 15 %
Lymphs Abs: 1.2 10*3/uL (ref 1.0–3.6)
MCH: 30.8 pg (ref 26.0–34.0)
MCHC: 32.9 g/dL (ref 32.0–36.0)
MCV: 93.5 fL (ref 80.0–100.0)
MONOS PCT: 10 %
Monocytes Absolute: 0.8 10*3/uL (ref 0.2–0.9)
Neutro Abs: 6 10*3/uL (ref 1.4–6.5)
Neutrophils Relative %: 73 %
Platelets: 265 10*3/uL (ref 150–440)
RBC: 2.9 MIL/uL — ABNORMAL LOW (ref 3.80–5.20)
RDW: 17.3 % — ABNORMAL HIGH (ref 11.5–14.5)
WBC: 8.1 10*3/uL (ref 3.6–11.0)

## 2018-03-23 LAB — CBC
HCT: 28.3 % — ABNORMAL LOW (ref 35.0–47.0)
HEMOGLOBIN: 9 g/dL — AB (ref 12.0–16.0)
MCH: 30.2 pg (ref 26.0–34.0)
MCHC: 32 g/dL (ref 32.0–36.0)
MCV: 94.3 fL (ref 80.0–100.0)
PLATELETS: 199 10*3/uL (ref 150–440)
RBC: 3 MIL/uL — AB (ref 3.80–5.20)
RDW: 17.4 % — ABNORMAL HIGH (ref 11.5–14.5)
WBC: 8.1 10*3/uL (ref 3.6–11.0)

## 2018-03-23 LAB — BASIC METABOLIC PANEL
Anion gap: 13 (ref 5–15)
BUN: 41 mg/dL — AB (ref 8–23)
CALCIUM: 9.1 mg/dL (ref 8.9–10.3)
CO2: 21 mmol/L — ABNORMAL LOW (ref 22–32)
Chloride: 104 mmol/L (ref 98–111)
Creatinine, Ser: 6.23 mg/dL — ABNORMAL HIGH (ref 0.44–1.00)
GFR calc Af Amer: 7 mL/min — ABNORMAL LOW (ref >60)
GFR, EST NON AFRICAN AMERICAN: 6 mL/min — AB (ref >60)
GLUCOSE: 99 mg/dL (ref 70–99)
Potassium: 4.6 mmol/L (ref 3.5–5.1)
SODIUM: 138 mmol/L (ref 135–145)

## 2018-03-23 LAB — ECHOCARDIOGRAM COMPLETE
Height: 66 in
Weight: 3584 oz

## 2018-03-23 LAB — PHOSPHORUS: Phosphorus: 5.9 mg/dL — ABNORMAL HIGH (ref 2.5–4.6)

## 2018-03-23 LAB — LIPID PANEL
Cholesterol: 132 mg/dL (ref 0–200)
HDL: 40 mg/dL — AB (ref >40)
LDL CALC: 73 mg/dL (ref 0–99)
TRIGLYCERIDES: 94 mg/dL (ref <150)
Total CHOL/HDL Ratio: 3.3 RATIO
VLDL: 19 mg/dL (ref 0–40)

## 2018-03-23 LAB — TROPONIN I
TROPONIN I: 0.24 ng/mL — AB (ref <0.03)
Troponin I: 0.76 ng/mL (ref <0.03)

## 2018-03-23 LAB — GLUCOSE, CAPILLARY
GLUCOSE-CAPILLARY: 98 mg/dL (ref 70–99)
GLUCOSE-CAPILLARY: 89 mg/dL (ref 70–99)
Glucose-Capillary: 91 mg/dL (ref 70–99)
Glucose-Capillary: 116 mg/dL — ABNORMAL HIGH (ref 70–99)

## 2018-03-23 LAB — PROTIME-INR
INR: 0.93
INR: 1.51
PROTHROMBIN TIME: 12.4 s (ref 11.4–15.2)
PROTHROMBIN TIME: 18.1 s — AB (ref 11.4–15.2)

## 2018-03-23 LAB — HEPARIN LEVEL (UNFRACTIONATED): HEPARIN UNFRACTIONATED: 0.2 [IU]/mL — AB (ref 0.30–0.70)

## 2018-03-23 LAB — APTT: aPTT: 36 seconds (ref 24–36)

## 2018-03-23 SURGERY — LEFT HEART CATH AND CORONARY ANGIOGRAPHY
Anesthesia: Moderate Sedation | Laterality: Right

## 2018-03-23 MED ORDER — INSULIN ASPART 100 UNIT/ML ~~LOC~~ SOLN: 0.0000 [IU] | Freq: Three times a day (TID) | SUBCUTANEOUS | Status: DC

## 2018-03-23 MED ORDER — SODIUM CHLORIDE 0.9 % IV SOLN: 250.0000 mL | INTRAVENOUS | Status: DC | PRN

## 2018-03-23 MED ORDER — SODIUM CHLORIDE 0.9% FLUSH: 3.0000 mL | Freq: Two times a day (BID) | INTRAVENOUS | Status: DC

## 2018-03-23 MED ORDER — SENNOSIDES-DOCUSATE SODIUM 8.6-50 MG PO TABS: 1.0000 | ORAL_TABLET | Freq: Every evening | ORAL | Status: DC | PRN

## 2018-03-23 MED ORDER — ACETAMINOPHEN 325 MG PO TABS
650.0000 mg | ORAL_TABLET | ORAL | Status: DC | PRN
  Administered 2018-03-23: 650 mg via ORAL

## 2018-03-23 MED ORDER — MIDAZOLAM HCL 2 MG/2ML IJ SOLN
INTRAMUSCULAR | Status: AC
  Filled 2018-03-23: qty 2

## 2018-03-23 MED ORDER — CLOPIDOGREL BISULFATE 75 MG PO TABS: 75.0000 mg | ORAL_TABLET | Freq: Every day | ORAL | Status: DC

## 2018-03-23 MED ORDER — SODIUM CHLORIDE 0.9 % WEIGHT BASED INFUSION
1.0000 mL/kg/h | INTRAVENOUS | Status: DC
  Administered 2018-03-23: 1 mL/kg/h via INTRAVENOUS

## 2018-03-23 MED ORDER — AMLODIPINE BESYLATE 10 MG PO TABS: 10.0000 mg | ORAL_TABLET | Freq: Every day | ORAL | Status: DC

## 2018-03-23 MED ORDER — GABAPENTIN 100 MG PO CAPS: 100.0000 mg | ORAL_CAPSULE | Freq: Every day | ORAL | Status: DC

## 2018-03-23 MED ORDER — METOPROLOL SUCCINATE ER 100 MG PO TB24: 100.0000 mg | ORAL_TABLET | Freq: Every day | ORAL | Status: DC

## 2018-03-23 MED ORDER — LABETALOL HCL 5 MG/ML IV SOLN
INTRAVENOUS | Status: AC
  Filled 2018-03-23: qty 4

## 2018-03-23 MED ORDER — SODIUM CHLORIDE 0.9 % WEIGHT BASED INFUSION
3.0000 mL/kg/h | INTRAVENOUS | Status: DC
  Administered 2018-03-23: 3 mL/kg/h via INTRAVENOUS

## 2018-03-23 MED ORDER — ONDANSETRON HCL 4 MG/2ML IJ SOLN: 4.0000 mg | Freq: Four times a day (QID) | INTRAMUSCULAR | Status: DC | PRN

## 2018-03-23 MED ORDER — SODIUM CHLORIDE 0.9% FLUSH: 3.0000 mL | INTRAVENOUS | Status: DC | PRN

## 2018-03-23 MED ORDER — BISACODYL 5 MG PO TBEC: 5.0000 mg | DELAYED_RELEASE_TABLET | Freq: Every day | ORAL | Status: DC | PRN

## 2018-03-23 MED ORDER — MIDAZOLAM HCL 2 MG/2ML IJ SOLN
INTRAMUSCULAR | Status: DC | PRN
  Administered 2018-03-23: 1 mg via INTRAVENOUS

## 2018-03-23 MED ORDER — METHOCARBAMOL 750 MG PO TABS
750.0000 mg | ORAL_TABLET | Freq: Two times a day (BID) | ORAL | Status: DC | PRN
  Filled 2018-03-23: qty 1

## 2018-03-23 MED ORDER — SEVELAMER CARBONATE 2.4 G PO PACK
2.4000 g | PACK | Freq: Two times a day (BID) | ORAL | Status: DC
  Filled 2018-03-23 (×2): qty 1

## 2018-03-23 MED ORDER — TORSEMIDE 20 MG PO TABS: 100.0000 mg | ORAL_TABLET | Freq: Every day | ORAL | Status: DC

## 2018-03-23 MED ORDER — RENA-VITE PO TABS: 1.0000 | ORAL_TABLET | Freq: Every day | ORAL | 0 refills | Status: DC

## 2018-03-23 MED ORDER — ASPIRIN 81 MG PO CHEW
243.0000 mg | CHEWABLE_TABLET | Freq: Once | ORAL | Status: AC
  Administered 2018-03-23: 243 mg via ORAL
  Filled 2018-03-23: qty 3

## 2018-03-23 MED ORDER — HEPARIN (PORCINE) IN NACL 1000-0.9 UT/500ML-% IV SOLN
INTRAVENOUS | Status: AC
  Filled 2018-03-23: qty 500

## 2018-03-23 MED ORDER — ISOSORBIDE MONONITRATE ER 30 MG PO TB24: 30.0000 mg | ORAL_TABLET | Freq: Every day | ORAL | 11 refills | Status: DC

## 2018-03-23 MED ORDER — HEPARIN (PORCINE) IN NACL 100-0.45 UNIT/ML-% IJ SOLN
1200.0000 [IU]/h | INTRAMUSCULAR | Status: DC
  Administered 2018-03-23: 1200 [IU]/h via INTRAVENOUS
  Administered 2018-03-23: 1000 [IU]/h via INTRAVENOUS
  Filled 2018-03-23: qty 250

## 2018-03-23 MED ORDER — ASPIRIN EC 81 MG PO TBEC: 81.0000 mg | DELAYED_RELEASE_TABLET | Freq: Every day | ORAL | Status: DC

## 2018-03-23 MED ORDER — ACETAMINOPHEN 325 MG PO TABS: 650.0000 mg | ORAL_TABLET | ORAL | Status: DC | PRN

## 2018-03-23 MED ORDER — FENTANYL CITRATE (PF) 100 MCG/2ML IJ SOLN
INTRAMUSCULAR | Status: AC
Start: 2018-03-23 — End: ?
  Filled 2018-03-23: qty 2

## 2018-03-23 MED ORDER — LIDOCAINE HCL (PF) 1 % IJ SOLN
INTRAMUSCULAR | Status: AC
  Filled 2018-03-23: qty 30

## 2018-03-23 MED ORDER — ACETAMINOPHEN 325 MG PO TABS
650.0000 mg | ORAL_TABLET | ORAL | Status: DC | PRN
  Filled 2018-03-23: qty 2

## 2018-03-23 MED ORDER — PANTOPRAZOLE SODIUM 40 MG PO TBEC: 40.0000 mg | DELAYED_RELEASE_TABLET | Freq: Two times a day (BID) | ORAL | Status: DC

## 2018-03-23 MED ORDER — LABETALOL HCL 5 MG/ML IV SOLN
20.0000 mg | Freq: Once | INTRAVENOUS | Status: AC
  Administered 2018-03-23: 20 mg via INTRAVENOUS

## 2018-03-23 MED ORDER — FENTANYL CITRATE (PF) 100 MCG/2ML IJ SOLN
INTRAMUSCULAR | Status: DC | PRN
  Administered 2018-03-23: 50 ug via INTRAVENOUS

## 2018-03-23 MED ORDER — VENLAFAXINE HCL ER 37.5 MG PO CP24
37.5000 mg | ORAL_CAPSULE | Freq: Every day | ORAL | Status: DC
  Administered 2018-03-23: 37.5 mg via ORAL
  Filled 2018-03-23: qty 1

## 2018-03-23 MED ORDER — MORPHINE SULFATE (PF) 4 MG/ML IV SOLN: 4.0000 mg | INTRAVENOUS | Status: DC | PRN

## 2018-03-23 MED ORDER — ISOSORBIDE DINITRATE 30 MG PO TABS
30.0000 mg | ORAL_TABLET | Freq: Every day | ORAL | Status: DC
  Filled 2018-03-23: qty 1

## 2018-03-23 MED ORDER — HEPARIN BOLUS VIA INFUSION
1200.0000 [IU] | Freq: Once | INTRAVENOUS | Status: DC
  Filled 2018-03-23: qty 1200

## 2018-03-23 MED ORDER — NITROGLYCERIN 0.4 MG SL SUBL: 0.4000 mg | SUBLINGUAL_TABLET | SUBLINGUAL | Status: DC | PRN

## 2018-03-23 MED ORDER — RENA-VITE PO TABS
1.0000 | ORAL_TABLET | Freq: Every day | ORAL | Status: DC
  Filled 2018-03-23: qty 1

## 2018-03-23 MED ORDER — LABETALOL HCL 5 MG/ML IV SOLN
INTRAVENOUS | Status: AC
  Administered 2018-03-23: 14:00:00
  Filled 2018-03-23: qty 4

## 2018-03-23 MED ORDER — ASPIRIN 81 MG PO CHEW
CHEWABLE_TABLET | ORAL | Status: AC
  Filled 2018-03-23: qty 1

## 2018-03-23 MED ORDER — ASPIRIN 81 MG PO CHEW
81.0000 mg | CHEWABLE_TABLET | ORAL | Status: AC
  Administered 2018-03-23: 81 mg via ORAL

## 2018-03-23 MED ORDER — HEPARIN SODIUM (PORCINE) 1000 UNIT/ML DIALYSIS
20.0000 [IU]/kg | INTRAMUSCULAR | Status: DC | PRN
  Filled 2018-03-23: qty 3

## 2018-03-23 MED ORDER — MOMETASONE FURO-FORMOTEROL FUM 200-5 MCG/ACT IN AERO
2.0000 | INHALATION_SPRAY | Freq: Two times a day (BID) | RESPIRATORY_TRACT | Status: DC
  Administered 2018-03-23: 2 via RESPIRATORY_TRACT
  Filled 2018-03-23: qty 8.8

## 2018-03-23 MED ORDER — MORPHINE SULFATE (PF) 2 MG/ML IV SOLN: 2.0000 mg | INTRAVENOUS | Status: DC | PRN

## 2018-03-23 MED ORDER — CALCIUM CARBONATE ANTACID 500 MG PO CHEW
600.0000 mg | CHEWABLE_TABLET | Freq: Every day | ORAL | Status: DC
  Administered 2018-03-23: 600 mg via ORAL
  Filled 2018-03-23: qty 3

## 2018-03-23 MED ORDER — HEPARIN (PORCINE) IN NACL 100-0.45 UNIT/ML-% IJ SOLN: 10.0000 [IU]/kg/h | Freq: Once | INTRAMUSCULAR | Status: DC

## 2018-03-23 MED ORDER — HYDRALAZINE HCL 20 MG/ML IJ SOLN: 10.0000 mg | Freq: Once | INTRAMUSCULAR | Status: DC

## 2018-03-23 MED ORDER — ROSUVASTATIN CALCIUM 10 MG PO TABS: 20.0000 mg | ORAL_TABLET | Freq: Every day | ORAL | Status: DC

## 2018-03-23 MED ORDER — ALBUTEROL SULFATE (2.5 MG/3ML) 0.083% IN NEBU: 2.5000 mg | INHALATION_SOLUTION | Freq: Four times a day (QID) | RESPIRATORY_TRACT | Status: DC | PRN

## 2018-03-23 MED ORDER — CALCIUM CARBONATE 1500 (600 CA) MG PO TABS: 600.0000 mg | ORAL_TABLET | Freq: Every day | ORAL | Status: DC

## 2018-03-23 MED ORDER — SODIUM CHLORIDE 0.9 % WEIGHT BASED INFUSION: 1.0000 mL/kg/h | INTRAVENOUS | Status: DC

## 2018-03-23 MED ORDER — HEPARIN SODIUM (PORCINE) 5000 UNIT/ML IJ SOLN
4000.0000 [IU] | Freq: Once | INTRAMUSCULAR | Status: AC
  Administered 2018-03-23: 4000 [IU] via INTRAVENOUS
  Filled 2018-03-23: qty 1

## 2018-03-23 MED ORDER — TIOTROPIUM BROMIDE MONOHYDRATE 18 MCG IN CAPS
1.0000 | ORAL_CAPSULE | Freq: Every day | RESPIRATORY_TRACT | Status: DC
  Filled 2018-03-23: qty 5

## 2018-03-23 MED ORDER — CINACALCET HCL 30 MG PO TABS
30.0000 mg | ORAL_TABLET | Freq: Every day | ORAL | Status: DC
  Administered 2018-03-23: 30 mg via ORAL
  Filled 2018-03-23: qty 1

## 2018-03-23 MED ORDER — LABETALOL HCL 5 MG/ML IV SOLN
INTRAVENOUS | Status: DC | PRN
  Administered 2018-03-23: 10 mg via INTRAVENOUS

## 2018-03-23 MED ORDER — ACETAMINOPHEN 325 MG PO TABS
650.0000 mg | ORAL_TABLET | Freq: Once | ORAL | Status: AC
  Administered 2018-03-23: 650 mg via ORAL
  Filled 2018-03-23: qty 2

## 2018-03-23 MED ORDER — HEPARIN (PORCINE) IN NACL 100-0.45 UNIT/ML-% IJ SOLN: 1200.0000 [IU]/h | INTRAMUSCULAR | Status: DC

## 2018-03-23 MED ORDER — NEPRO/CARBSTEADY PO LIQD: 237.0000 mL | Freq: Two times a day (BID) | ORAL | Status: DC

## 2018-03-23 SURGICAL SUPPLY — 8 items
CATH INFINITI 5FR ANG PIGTAIL (CATHETERS) ×3 IMPLANT
CATH INFINITI 5FR JL4 (CATHETERS) ×3 IMPLANT
CATH INFINITI JR4 5F (CATHETERS) ×3 IMPLANT
KIT MANI 3VAL PERCEP (MISCELLANEOUS) ×3 IMPLANT
NEEDLE PERC 18GX7CM (NEEDLE) ×3 IMPLANT
PACK CARDIAC CATH (CUSTOM PROCEDURE TRAY) ×3 IMPLANT
SHEATH AVANTI 5FR X 11CM (SHEATH) ×3 IMPLANT
WIRE GUIDERIGHT .035X150 (WIRE) ×3 IMPLANT

## 2018-03-23 NOTE — Plan of Care (Signed)
  Problem: Education: Goal: Knowledge of General Education information will improve Outcome: Progressing   Problem: Health Behavior/Discharge Planning: Goal: Ability to manage health-related needs will improve Outcome: Progressing   Problem: Pain Managment: Goal: General experience of comfort will improve Outcome: Progressing   

## 2018-03-23 NOTE — Progress Notes (Signed)
Initial Nutrition Assessment  DOCUMENTATION CODES:   Obesity unspecified  INTERVENTION:   Nepro Shake po BID, each supplement provides 425 kcal and 19 grams protein  Rena-vite daily   NUTRITION DIAGNOSIS:   Increased nutrient needs related to chronic illness(CHF, COPD, ESRD on HD) as evidenced by increased estimated needs.  GOAL:   Patient will meet greater than or equal to 90% of their needs  MONITOR:   PO intake, Supplement acceptance, Labs, Weight trends, Skin, I & O's  REASON FOR ASSESSMENT:   Malnutrition Screening Tool    ASSESSMENT:   67 y.o. female with a known history of known multi-vessel CAD (per 12/25/2017 cardiac cath), chronic diastolic CHF (EF 90-30% w/ grade I diastolic dysfxn as of 06/11/3006 Echo), DM, HTN, HLD, ESRD (HD T/T/S via LUE AVF) p/w CP. Pt was admitted to the hospital from 07/02-07/03 for chest pain and pulmonary congestion/interstitial edema. She received inpt HD on 07/02. She was discharged on 07/03, and had outpt HD on 07/04. On 07/04 evening pt developed L-sided CP radiating to the L neck, shoulder and arm. Found to have NSTEMI   Pt with L AVF  Pt familiar to the nutrition department from multiple previous admits. Pt with intermittent poor appetite and oral intake at baseline but usually eats well in the hospital. Pt does drink Nepro supplements during dialysis. Per chart, pt is weight stable. Pt has reported in the past that she does not take her binders regularly; pt noted to have elevated P on June 14 (7.1). RD will add supplements to replace losses from HD. Pt NPO today for cardiac cath.    Medications reviewed and include: aspirin, tums, cinacalcet, plavix, insulin, protonix, renvela  Labs reviewed: Hgb 9.0(L), Hct 28.3(L) iPTH- 75(H)- 06/2017  NUTRITION - FOCUSED PHYSICAL EXAM:    Most Recent Value  Orbital Region  No depletion  Upper Arm Region  No depletion  Thoracic and Lumbar Region  No depletion  Buccal Region  No depletion   Temple Region  No depletion  Clavicle Bone Region  No depletion  Clavicle and Acromion Bone Region  No depletion  Scapular Bone Region  No depletion  Dorsal Hand  No depletion  Patellar Region  No depletion  Anterior Thigh Region  No depletion  Posterior Calf Region  No depletion  Edema (RD Assessment)  Mild  Hair  Reviewed  Eyes  Reviewed  Mouth  Reviewed  Skin  Reviewed  Nails  Reviewed     Diet Order:   Diet Order           Diet NPO time specified  Diet effective now         EDUCATION NEEDS:   No education needs have been identified at this time  Skin:  Skin Assessment: Reviewed RN Assessment  Last BM:  7/4  Height:   Ht Readings from Last 1 Encounters:  03/23/18 5\' 6"  (1.676 m)    Weight:   Wt Readings from Last 1 Encounters:  03/23/18 224 lb (101.6 kg)    Ideal Body Weight:  59 kg  BMI:  Body mass index is 36.15 kg/m.  Estimated Nutritional Needs:   Kcal:  1800-2100kcal/day   Protein:  102-112g/day   Fluid:  >1.8L/day or per MD  Kimberly Distance MS, RD, LDN Pager #- [phone] Office#- [phone] After Hours Pager: [phone]

## 2018-03-23 NOTE — Consult Note (Signed)
Kimberly Shaffer is a 67 y.o. female  [id number]  Primary Cardiologist: Neoma Laming Reason for Consultation: Chest pain  HPI: 67 year old African-American female presented to the hospital with chest pain associated with shortness of breath and diaphoresis.   Review of Systems: She has no orthopnea PND or leg swelling.   Past Medical History:  Diagnosis Date  . Asthma   . CAD (coronary artery disease)   . Chronic lower back pain   . COPD (chronic obstructive pulmonary disease) ([place])   . Depression   . Diabetes mellitus without complication (HCC)    NIDD  . H/O angina pectoris   . H/O blood clots   . Hypercholesteremia   . Hypertension   . Left rotator cuff tear   . Myocardial infarction ([place])   . Obesity   . Renal insufficiency   . Stroke ([place])   . Vertigo, aural     Medications Prior to Admission  Medication Sig Dispense Refill  . albuterol (PROVENTIL HFA;VENTOLIN HFA) 108 (90 BASE) MCG/ACT inhaler Inhale 2 puffs into the lungs every 6 (six) hours as needed for wheezing or shortness of breath.     Marland Kitchen aspirin EC 81 MG tablet Take 81 mg by mouth daily.    . calcium carbonate (OSCAL) 1500 (600 Ca) MG TABS tablet Take 600 mg of elemental calcium by mouth daily with breakfast.    . clopidogrel (PLAVIX) 75 MG tablet Take 1 tablet by mouth daily.  3  . gabapentin (NEURONTIN) 100 MG capsule Take 100 mg by mouth at bedtime.    . methocarbamol (ROBAXIN) 750 MG tablet Take 750 mg by mouth 2 (two) times daily as needed for muscle spasms.     . nitroGLYCERIN (NITROLINGUAL) 0.4 MG/SPRAY spray Place 2 sprays under the tongue every 5 (five) minutes x 3 doses as needed for chest pain.     . Oxycodone HCl 10 MG TABS Take 10 mg by mouth every 6 (six) hours as needed (pain).     . pantoprazole (PROTONIX) 40 MG tablet Take 40 mg by mouth 2 (two) times daily.    Marland Kitchen RENVELA 2.4 g PACK Take 2.4 g by mouth 2 (two) times daily. 30 each 0  . rosuvastatin (CRESTOR) 20 MG tablet Take 1 tablet (20  mg total) by mouth daily. 30 tablet 1  . SENSIPAR 30 MG tablet Take 1 tablet (30 mg total) by mouth daily. 60 tablet 0  . SPIRIVA HANDIHALER 18 MCG inhalation capsule Place 1 capsule into inhaler and inhale daily.    . SYMBICORT 160-4.5 MCG/ACT inhaler Inhale 2 puffs into the lungs 2 (two) times daily.    Marland Kitchen torsemide (DEMADEX) 100 MG tablet Take 1 tablet (100 mg total) by mouth daily. 30 tablet 0  . venlafaxine XR (EFFEXOR-XR) 37.5 MG 24 hr capsule Take 37.5 mg by mouth daily with breakfast.    . Nutritional Supplements (FEEDING SUPPLEMENT, NEPRO CARB STEADY,) LIQD Take 237 mLs by mouth daily. 15 Can 0     . amLODipine  10 mg Oral Daily  . [START ON 03/24/2018] aspirin EC  81 mg Oral Daily  . calcium carbonate  600 mg of elemental calcium Oral Q breakfast  . cinacalcet  30 mg Oral Q breakfast  . clopidogrel  75 mg Oral Daily  . gabapentin  100 mg Oral QHS  . insulin aspart  0-15 Units Subcutaneous TID WC  . insulin aspart  0-9 Units Subcutaneous TID WC  . metoprolol succinate  100 mg  Oral Daily  . mometasone-formoterol  2 puff Inhalation BID  . pantoprazole  40 mg Oral BID  . rosuvastatin  20 mg Oral Daily  . sevelamer carbonate  2.4 g Oral BID WC  . tiotropium  1 capsule Inhalation Daily  . torsemide  100 mg Oral Daily  . venlafaxine XR  37.5 mg Oral Q breakfast    Infusions: . heparin 1,000 Units/hr (03/23/18 0220)    Allergies  Allergen Reactions  . Nystatin Hives and Itching  . Sulfa Antibiotics Swelling, Hives and Rash  . Niaspan [Niacin] Other (See Comments) and Hives    Social History   Socioeconomic History  . Marital status: Widowed    Spouse name: Not on file  . Number of children: Not on file  . Years of education: Not on file  . Highest education level: Not on file  Occupational History  . Occupation: retired  Scientific laboratory technician  . Financial resource strain: Not on file  . Food insecurity:    Worry: Not on file    Inability: Not on file  . Transportation  needs:    Medical: Not on file    Non-medical: Not on file  Tobacco Use  . Smoking status: Current Every Day Smoker    Packs/day: 0.25    Types: Cigarettes  . Smokeless tobacco: Never Used  Substance and Sexual Activity  . Alcohol use: No    Alcohol/week: 0.0 oz  . Drug use: No  . Sexual activity: Yes  Lifestyle  . Physical activity:    Days per week: Not on file    Minutes per session: Not on file  . Stress: Not on file  Relationships  . Social connections:    Talks on phone: Not on file    Gets together: Not on file    Attends religious service: Not on file    Active member of club or organization: Not on file    Attends meetings of clubs or organizations: Not on file    Relationship status: Not on file  . Intimate partner violence:    Fear of current or ex partner: Not on file    Emotionally abused: Not on file    Physically abused: Not on file    Forced sexual activity: Not on file  Other Topics Concern  . Not on file  Social History Narrative  . Not on file    Family History  Problem Relation Age of Onset  . Cancer Mother   . Cancer Father   . Diabetes Brother   . Heart disease Brother     PHYSICAL EXAM: Vitals:   03/23/18 0500 03/23/18 0737  BP: (!) 169/74 (!) 156/70  Pulse: 80 89  Resp: 17 20  Temp: 97.9 F (36.6 C) 97.9 F (36.6 C)  SpO2: 98% 96%     Intake/Output Summary (Last 24 hours) at 03/23/2018 0930 Last data filed at 03/23/2018 0830 Gross per 24 hour  Intake -  Output 400 ml  Net -400 ml    General:  Well appearing. No respiratory difficulty HEENT: normal Neck: supple. no JVD. Carotids 2+ bilat; no bruits. No lymphadenopathy or thryomegaly appreciated. Cor: PMI nondisplaced. Regular rate & rhythm. No rubs, gallops or murmurs. Lungs: clear Abdomen: soft, nontender, nondistended. No hepatosplenomegaly. No bruits or masses. Good bowel sounds. Extremities: no cyanosis, clubbing, rash, edema Neuro: alert & oriented x 3, cranial nerves  grossly intact. moves all 4 extremities w/o difficulty. Affect pleasant.  ECG: Normal sinus rhythm nonspecific ST-T  changes  Results for orders placed or performed during the hospital encounter of 03/23/18 (from the past 24 hour(s))  CBC with Differential     Status: Abnormal   Collection Time: 03/23/18 12:43 AM  Result Value Ref Range   WBC 8.1 3.6 - 11.0 K/uL   RBC 2.90 (L) 3.80 - 5.20 MIL/uL   Hemoglobin 8.9 (L) 12.0 - 16.0 g/dL   HCT 27.1 (L) 35.0 - 47.0 %   MCV 93.5 80.0 - 100.0 fL   MCH 30.8 26.0 - 34.0 pg   MCHC 32.9 32.0 - 36.0 g/dL   RDW 17.3 (H) 11.5 - 14.5 %   Platelets 265 150 - 440 K/uL   Neutrophils Relative % 73 %   Neutro Abs 6.0 1.4 - 6.5 K/uL   Lymphocytes Relative 15 %   Lymphs Abs 1.2 1.0 - 3.6 K/uL   Monocytes Relative 10 %   Monocytes Absolute 0.8 0.2 - 0.9 K/uL   Eosinophils Relative 2 %   Eosinophils Absolute 0.1 0 - 0.7 K/uL   Basophils Relative 0 %   Basophils Absolute 0.0 0 - 0.1 K/uL  Comprehensive metabolic panel     Status: Abnormal   Collection Time: 03/23/18  1:33 AM  Result Value Ref Range   Sodium 139 135 - 145 mmol/L   Potassium 5.0 3.5 - 5.1 mmol/L   Chloride 103 98 - 111 mmol/L   CO2 24 22 - 32 mmol/L   Glucose, Bld 116 (H) 70 - 99 mg/dL   BUN 40 (H) 8 - 23 mg/dL   Creatinine, Ser 6.15 (H) 0.44 - 1.00 mg/dL   Calcium 9.0 8.9 - 10.3 mg/dL   Total Protein 8.2 (H) 6.5 - 8.1 g/dL   Albumin 3.5 3.5 - 5.0 g/dL   AST 19 15 - 41 U/L   ALT 9 0 - 44 U/L   Alkaline Phosphatase 53 38 - 126 U/L   Total Bilirubin 0.5 0.3 - 1.2 mg/dL   GFR calc non Af Amer 6 (L) >60 mL/min   GFR calc Af Amer 7 (L) >60 mL/min   Anion gap 12 5 - 15  Troponin I     Status: Abnormal   Collection Time: 03/23/18  1:33 AM  Result Value Ref Range   Troponin I 0.24 (HH) <0.03 ng/mL  Protime-INR     Status: None   Collection Time: 03/23/18  2:01 AM  Result Value Ref Range   Prothrombin Time 12.4 11.4 - 15.2 seconds   INR 0.93   APTT     Status: None   Collection  Time: 03/23/18  2:01 AM  Result Value Ref Range   aPTT 36 24 - 36 seconds  Glucose, capillary     Status: None   Collection Time: 03/23/18  5:08 AM  Result Value Ref Range   Glucose-Capillary 98 70 - 99 mg/dL  CBC     Status: Abnormal   Collection Time: 03/23/18  5:35 AM  Result Value Ref Range   WBC 8.1 3.6 - 11.0 K/uL   RBC 3.00 (L) 3.80 - 5.20 MIL/uL   Hemoglobin 9.0 (L) 12.0 - 16.0 g/dL   HCT 28.3 (L) 35.0 - 47.0 %   MCV 94.3 80.0 - 100.0 fL   MCH 30.2 26.0 - 34.0 pg   MCHC 32.0 32.0 - 36.0 g/dL   RDW 17.4 (H) 11.5 - 14.5 %   Platelets 199 150 - 440 K/uL  Glucose, capillary     Status: None   Collection  Time: 03/23/18  7:38 AM  Result Value Ref Range   Glucose-Capillary 89 70 - 99 mg/dL  Troponin I     Status: Abnormal   Collection Time: 03/23/18  8:47 AM  Result Value Ref Range   Troponin I 0.76 (HH) <0.03 ng/mL   Dg Chest Portable 1 View  Result Date: 03/23/2018 CLINICAL DATA:  Left-sided chest pain EXAM: PORTABLE CHEST 1 VIEW COMPARISON:  03/20/2018, 03/02/2018 FINDINGS: Mild cardiomegaly with vascular congestion and mild edema. No acute consolidation or effusion. Aortic atherosclerosis. No pneumothorax. IMPRESSION: Cardiomegaly with vascular congestion and mild edema. Electronically Signed   By: Donavan Foil M.D.   On: 03/23/2018 00:41     ASSESSMENT AND PLAN: Non-STEMI with recurrent chest pain and history of coronary artery disease three-vessel. Advise proceeding with cardiac catheterization patient was explained risks and benefits and patient has agreed to the procedure.  Kimberly Shaffer A

## 2018-03-23 NOTE — ED Notes (Addendum)
Pt sleeping soundly with no distress noted

## 2018-03-23 NOTE — Progress Notes (Signed)
ANTICOAGULATION CONSULT NOTE - Initial Consult  Pharmacy Consult for heparin Indication: chest pain/ACS  Allergies  Allergen Reactions  . Nystatin Hives and Itching  . Sulfa Antibiotics Swelling, Hives and Rash  . Niaspan [Niacin] Other (See Comments) and Hives    Patient Measurements: Height: 5\' 6"  (167.6 cm) Weight: 221 lb 12.5 oz (100.6 kg) IBW/kg (Calculated) : 59.3 Heparin Dosing Weight: 78 kg  Vital Signs: Temp: 97.7 F (36.5 C) (07/05 0020) Temp Source: Oral (07/05 0020) BP: 158/82 (07/05 0020) Pulse Rate: 93 (07/05 0020)  Labs: Recent Labs    03/20/18 0114 03/20/18 0418  HGB 8.7*  --   HCT 26.2*  --   PLT 237  --   CREATININE 9.92*  --   TROPONINI 0.03* 0.06*    Estimated Creatinine Clearance: 6.6 mL/min (A) (by C-G formula based on SCr of 9.92 mg/dL (H)).   Medical History: Past Medical History:  Diagnosis Date  . Asthma   . CAD (coronary artery disease)   . Chronic lower back pain   . COPD (chronic obstructive pulmonary disease) ([place])   . Depression   . Diabetes mellitus without complication (HCC)    NIDD  . H/O angina pectoris   . H/O blood clots   . Hypercholesteremia   . Hypertension   . Left rotator cuff tear   . Myocardial infarction ([place])   . Obesity   . Renal insufficiency   . Stroke ([place])   . Vertigo, aural     Medications:  Scheduled:  . aspirin  243 mg Oral Once  . heparin  4,000 Units Intravenous Once    Assessment: Patient here for CP radiating to jaw was here 2 days ago for same issue was thought to be s/t pulmonary edema. trops not resulted yet, EKG NSR. No PTA anticoagulation Being started on heparin drip  Goal of Therapy:  Heparin level 0.3-0.7 units/ml Monitor platelets by anticoagulation protocol: Yes   Plan:  Will bolus w/ heparin 4000 units IV x 1 Will start drip at 1000 units/hr  Will check anti-Xa @ 0900 8 hours post start infusion Baseline labs drawn Will monitor daily CBC's and adjust per anti-Xa  levels.  Tobie Lords, PharmD, BCPS Clinical Pharmacist 03/23/2018

## 2018-03-23 NOTE — Progress Notes (Signed)
Patient was transferred to [facility] via their ground transport service with an Therapist, sports on board.  Her IVs will remain in place.  Phillis Knack, RN

## 2018-03-23 NOTE — Progress Notes (Signed)
[facility], [place] 03/23/18  Subjective:   Patient is readmitted for chest pain She states she had HD yesterday without any problems She is scheduled for cardiac cath today   Objective:  Vital signs in last 24 hours:  Temp:  [97.7 F (36.5 C)-97.9 F (36.6 C)] 97.9 F (36.6 C) (07/05 0737) Pulse Rate:  [80-96] 89 (07/05 0737) Resp:  [17-20] 20 (07/05 0737) BP: (147-174)/(70-92) 156/70 (07/05 0737) SpO2:  [94 %-99 %] 96 % (07/05 0737) Weight:  [100.6 kg (221 lb 12.5 oz)-101.6 kg (224 lb)] 101.6 kg (224 lb) (07/05 0453)  Weight change:  Filed Weights   03/23/18 0018 03/23/18 0453  Weight: 100.6 kg (221 lb 12.5 oz) 101.6 kg (224 lb)    Intake/Output:    Intake/Output Summary (Last 24 hours) at 03/23/2018 1058 Last data filed at 03/23/2018 0830 Gross per 24 hour  Intake -  Output 400 ml  Net -400 ml     Physical Exam: General:  No acute distress, laying in bed  HEENT  anicteric, moist oral mucous membranes  Neck  supple   Pulm/lungs  clear to auscultation  CVS/Heart  regular, no rub or gallop  Abdomen:   Soft, nontender  Extremities:  No peripheral edema  Neurologic:  Alert and oriented  Skin:  No acute rashes  Access:  Left upper extremity AV fistula       Basic Metabolic Panel:  Recent Labs  Lab 03/20/18 0114 03/23/18 0133 03/23/18 0535  NA 140 139 138  K 4.9 5.0 4.6  CL 101 103 104  CO2 23 24 21*  GLUCOSE 126* 116* 99  BUN 68* 40* 41*  CREATININE 9.92* 6.15* 6.23*  CALCIUM 8.8* 9.0 9.1  PHOS  --   --  5.9*     CBC: Recent Labs  Lab 03/20/18 0114 03/23/18 0043 03/23/18 0535  WBC 8.4 8.1 8.1  NEUTROABS  --  6.0  --   HGB 8.7* 8.9* 9.0*  HCT 26.2* 27.1* 28.3*  MCV 93.2 93.5 94.3  PLT 237 265 199      Lab Results  Component Value Date   HEPBSAG Negative 01/11/2018   HEPBSAB Reactive 01/11/2018      Microbiology:  No results found for this or any previous visit (from the past 240  hour(s)).  Coagulation Studies: Recent Labs    03/23/18 0201 03/23/18 0535  LABPROT 12.4 18.1*  INR 0.93 1.51    Urinalysis: No results for input(s): COLORURINE, LABSPEC, PHURINE, GLUCOSEU, HGBUR, BILIRUBINUR, KETONESUR, PROTEINUR, UROBILINOGEN, NITRITE, LEUKOCYTESUR in the last 72 hours.  Invalid input(s): APPERANCEUR    Imaging: Dg Chest Portable 1 View  Result Date: 03/23/2018 CLINICAL DATA:  Left-sided chest pain EXAM: PORTABLE CHEST 1 VIEW COMPARISON:  03/20/2018, 03/02/2018 FINDINGS: Mild cardiomegaly with vascular congestion and mild edema. No acute consolidation or effusion. Aortic atherosclerosis. No pneumothorax. IMPRESSION: Cardiomegaly with vascular congestion and mild edema. Electronically Signed   By: Donavan Foil M.D.   On: 03/23/2018 00:41     Medications:   . sodium chloride    . [START ON 03/24/2018] sodium chloride     Followed by  . [START ON 03/24/2018] sodium chloride    . heparin 1,000 Units/hr (03/23/18 0220)   . amLODipine  10 mg Oral Daily  . [START ON 03/24/2018] aspirin  81 mg Oral Pre-Cath  . [START ON 03/24/2018] aspirin EC  81 mg Oral Daily  . calcium carbonate  600 mg of elemental calcium Oral Q breakfast  .  cinacalcet  30 mg Oral Q breakfast  . clopidogrel  75 mg Oral Daily  . feeding supplement (NEPRO CARB STEADY)  237 mL Oral BID BM  . gabapentin  100 mg Oral QHS  . insulin aspart  0-15 Units Subcutaneous TID WC  . insulin aspart  0-9 Units Subcutaneous TID WC  . metoprolol succinate  100 mg Oral Daily  . mometasone-formoterol  2 puff Inhalation BID  . multivitamin  1 tablet Oral QHS  . pantoprazole  40 mg Oral BID  . rosuvastatin  20 mg Oral Daily  . sevelamer carbonate  2.4 g Oral BID WC  . sodium chloride flush  3 mL Intravenous Q12H  . tiotropium  1 capsule Inhalation Daily  . torsemide  100 mg Oral Daily  . venlafaxine XR  37.5 mg Oral Q breakfast   sodium chloride, acetaminophen, albuterol, bisacodyl, heparin, methocarbamol,  morphine injection **OR** morphine injection, nitroGLYCERIN, ondansetron (ZOFRAN) IV, senna-docusate, sodium chloride flush  Assessment/ Plan:  67 y.o. African-American female with end stage renal disease on hemodialysis, coronary artery disease status post CABG, hypertension, peripheral vascular disease, diabetes mellitus type 2, hyperlipidemia, bilateral total knee replacement, PD catheter removal June 30, 2017  [facility]TTS/left AVF/ 100.5 kg  1.  End-stage renal disease on hemodialysis   2.  Anemia of chronic kidney disease 3.  Secondary hyperparathyroidism 4.  chest pain  Plan: HD tomorrow EPO with HD Cardiac cath planned Electrolytes and Volume status are acceptable No acute indication for Dialysis at present      LOS: 0 Tanazia Achee Candiss Norse 7/5/201910:57 AM  [place], [place]  Note: This note was prepared with Dragon dictation. Any transcription errors are unintentional

## 2018-03-23 NOTE — Progress Notes (Addendum)
ANTICOAGULATION CONSULT NOTE - Initial Consult  Pharmacy Consult for heparin Indication: chest pain/ACS  Allergies  Allergen Reactions  . Nystatin Hives and Itching  . Sulfa Antibiotics Swelling, Hives and Rash  . Niaspan [Niacin] Other (See Comments) and Hives    Patient Measurements: Height: 5\' 6"  (167.6 cm) Weight: 224 lb (101.6 kg)(Jeff ([employer])) IBW/kg (Calculated) : 59.3 Heparin Dosing Weight: 78 kg  Vital Signs: Temp: 97.9 F (36.6 C) (07/05 0737) Temp Source: Oral (07/05 0737) BP: 156/70 (07/05 0737) Pulse Rate: 89 (07/05 0737)  Labs: Recent Labs    03/23/18 0043 03/23/18 0133 03/23/18 0201 03/23/18 0535 03/23/18 0847  HGB 8.9*  --   --  9.0*  --   HCT 27.1*  --   --  28.3*  --   PLT 265  --   --  199  --   APTT  --   --  36  --   --   LABPROT  --   --  12.4 18.1*  --   INR  --   --  0.93 1.51  --   HEPARINUNFRC  --   --   --   --  >3.60*  CREATININE  --  6.15*  --  6.23*  --   TROPONINI  --  0.24*  --   --  0.76*    Estimated Creatinine Clearance: 10.5 mL/min (A) (by C-G formula based on SCr of 6.23 mg/dL (H)).   Medical History: Past Medical History:  Diagnosis Date  . Asthma   . CAD (coronary artery disease)   . Chronic lower back pain   . COPD (chronic obstructive pulmonary disease) ([place])   . Depression   . Diabetes mellitus without complication (HCC)    NIDD  . H/O angina pectoris   . H/O blood clots   . Hypercholesteremia   . Hypertension   . Left rotator cuff tear   . Myocardial infarction ([place])   . Obesity   . Renal insufficiency   . Stroke ([place])   . Vertigo, aural     Medications:  Scheduled:  . amLODipine  10 mg Oral Daily  . [START ON 03/24/2018] aspirin  81 mg Oral Pre-Cath  . [START ON 03/24/2018] aspirin EC  81 mg Oral Daily  . calcium carbonate  600 mg of elemental calcium Oral Q breakfast  . cinacalcet  30 mg Oral Q breakfast  . clopidogrel  75 mg Oral Daily  . gabapentin  100 mg Oral QHS  . insulin aspart  0-15 Units  Subcutaneous TID WC  . insulin aspart  0-9 Units Subcutaneous TID WC  . metoprolol succinate  100 mg Oral Daily  . mometasone-formoterol  2 puff Inhalation BID  . pantoprazole  40 mg Oral BID  . rosuvastatin  20 mg Oral Daily  . sevelamer carbonate  2.4 g Oral BID WC  . sodium chloride flush  3 mL Intravenous Q12H  . tiotropium  1 capsule Inhalation Daily  . torsemide  100 mg Oral Daily  . venlafaxine XR  37.5 mg Oral Q breakfast    Assessment: Patient here for CP radiating to jaw was here 2 days ago for same issue was thought to be s/t pulmonary edema. trops not resulted yet, EKG NSR. No PTA anticoagulation Being started on heparin drip  Goal of Therapy:  Heparin level 0.3-0.7 units/ml Monitor platelets by anticoagulation protocol: Yes   Plan:  Will bolus w/ heparin 4000 units IV x 1 Will start drip at 1000  units/hr  Will check anti-Xa @ 0900 8 hours post start infusion Baseline labs drawn Will monitor daily CBC's and adjust per anti-Xa levels.  03/23/2018 08:47 HL supratherapeutic x 1. I asked lab to redraw through peripheral stick to confirm level. Last admission patient was therapeutic on a higher rate so I expect this is a draw error.   03/23/2018 10:50 HL subtherapeutic x 1. 1200 units IV x 1 bolus and increase rate to 1200 units/hr. Will recheck HL in 8 hours. RN was unable to take call about bolus/rate increase - left message for her to call back.   Leighton Luster A. Jordan Hawks, PharmD, BCPS Clinical Pharmacist 03/23/2018

## 2018-03-23 NOTE — ED Notes (Signed)
Pt ambualtory to BR to void, without difficulty or distress noted; denies any c/o

## 2018-03-23 NOTE — Discharge Summary (Signed)
[facility] [place] NAME: Kimberly Shaffer    MR#:  [id number]  DATE OF BIRTH:  Oct 20, 1950  DATE OF ADMISSION:  03/23/2018 ADMITTING PHYSICIAN: Arta Silence, MD  DATE OF DISCHARGE: 03/23/2018  PRIMARY CARE PHYSICIAN: Clinic, [facility]    ADMISSION DIAGNOSIS:  Abnormal EKG [R94.31] NSTEMI (non-ST elevated myocardial infarction) ([place]) [I21.4]  DISCHARGE DIAGNOSIS:  Active Problems:   NSTEMI (non-ST elevated myocardial infarction) ([place])   SECONDARY DIAGNOSIS:   Past Medical History:  Diagnosis Date  . Asthma   . CAD (coronary artery disease)   . Chronic lower back pain   . COPD (chronic obstructive pulmonary disease) ([place])   . Depression   . Diabetes mellitus without complication (HCC)    NIDD  . H/O angina pectoris   . H/O blood clots   . Hypercholesteremia   . Hypertension   . Left rotator cuff tear   . Myocardial infarction ([place])   . Obesity   . Renal insufficiency   . Stroke ([place])   . Vertigo, aural     HOSPITAL COURSE:   1.  NSTEMI.  Patient was given heparin drip.  Given aspirin, metoprolol and Crestor.  Add on lipid panel to morning labs.  Patient had a cardiac catheterization.  Please see operative report.  Cardiology recommend evaluation for bypass surgery.  Patient will be transferred to [facility] once bed available under the care of Dr. Sharmon Leyden.  Plavix held. 2.  End-stage renal disease on dialysis Tuesday Thursday and Saturday.  Patient will need dialysis on Saturday. 3.  Hyperlipidemia unspecified on Crestor.  Add on a lipid profile. 4.  COPD.  Continue normal inhalers. 5.  Depression.  Recommend psychiatric consultation for labile mood from being sick. 6.  Anemia of chronic disease 7.  Patient is a DNR.  DISCHARGE CONDITIONS:   Fair  CONSULTS OBTAINED:  Treatment Team:  Arta Silence, MD Dionisio David, MD Murlean Iba, MD Clapacs, Madie Reno, MD  DRUG ALLERGIES:   Allergies  Allergen  Reactions  . Nystatin Hives and Itching  . Sulfa Antibiotics Swelling, Hives and Rash  . Niaspan [Niacin] Other (See Comments) and Hives    DISCHARGE MEDICATIONS:   Allergies as of 03/23/2018      Reactions   Nystatin Hives, Itching   Sulfa Antibiotics Swelling, Hives, Rash   Niaspan [niacin] Other (See Comments), Hives      Medication List    STOP taking these medications   clopidogrel 75 MG tablet Commonly known as:  PLAVIX     TAKE these medications   acetaminophen 325 MG tablet Commonly known as:  TYLENOL Take 2 tablets (650 mg total) by mouth every 4 (four) hours as needed for headache or mild pain.   albuterol 108 (90 Base) MCG/ACT inhaler Commonly known as:  PROVENTIL HFA;VENTOLIN HFA Inhale 2 puffs into the lungs every 6 (six) hours as needed for wheezing or shortness of breath.   amLODipine 10 MG tablet Commonly known as:  NORVASC Take 1 tablet (10 mg total) by mouth daily.   aspirin EC 81 MG tablet Take 81 mg by mouth daily.   calcium carbonate 1500 (600 Ca) MG Tabs tablet Commonly known as:  OSCAL Take 600 mg of elemental calcium by mouth daily with breakfast.   feeding supplement (NEPRO CARB STEADY) Liqd Take 237 mLs by mouth daily.   gabapentin 100 MG capsule Commonly known as:  NEURONTIN Take 100 mg by mouth at bedtime.  heparin 100-0.45 UNIT/ML-% infusion Inject 1,200 Units/hr into the vein continuous.   isosorbide mononitrate 30 MG 24 hr tablet Commonly known as:  IMDUR Take 1 tablet (30 mg total) by mouth daily.   methocarbamol 750 MG tablet Commonly known as:  ROBAXIN Take 750 mg by mouth 2 (two) times daily as needed for muscle spasms.   metoprolol succinate 100 MG 24 hr tablet Commonly known as:  TOPROL-XL Take 1 tablet (100 mg total) by mouth daily. Take with or immediately following a meal.   multivitamin Tabs tablet Take 1 tablet by mouth at bedtime.   nitroGLYCERIN 0.4 MG/SPRAY spray Commonly known as:  NITROLINGUAL Place 2  sprays under the tongue every 5 (five) minutes x 3 doses as needed for chest pain.   Oxycodone HCl 10 MG Tabs Take 10 mg by mouth every 6 (six) hours as needed (pain).   pantoprazole 40 MG tablet Commonly known as:  PROTONIX Take 40 mg by mouth 2 (two) times daily.   RENVELA 2.4 g Pack Generic drug:  sevelamer carbonate Take 2.4 g by mouth 2 (two) times daily.   rosuvastatin 20 MG tablet Commonly known as:  CRESTOR Take 1 tablet (20 mg total) by mouth daily.   SENSIPAR 30 MG tablet Generic drug:  cinacalcet Take 1 tablet (30 mg total) by mouth daily.   SPIRIVA HANDIHALER 18 MCG inhalation capsule Generic drug:  tiotropium Place 1 capsule into inhaler and inhale daily.   SYMBICORT 160-4.5 MCG/ACT inhaler Generic drug:  budesonide-formoterol Inhale 2 puffs into the lungs 2 (two) times daily.   torsemide 100 MG tablet Commonly known as:  DEMADEX Take 1 tablet (100 mg total) by mouth daily.   venlafaxine XR 37.5 MG 24 hr capsule Commonly known as:  EFFEXOR-XR Take 37.5 mg by mouth daily with breakfast.        DISCHARGE INSTRUCTIONS:   Follow-up with cardiothoracic surgery at [facility].  If you experience worsening of your admission symptoms, develop shortness of breath, life threatening emergency, suicidal or homicidal thoughts you must seek medical attention immediately by calling 911 or calling your MD immediately  if symptoms less severe.  You Must read complete instructions/literature along with all the possible adverse reactions/side effects for all the Medicines you take and that have been prescribed to you. Take any new Medicines after you have completely understood and accept all the possible adverse reactions/side effects.   Please note  You were cared for by a hospitalist during your hospital stay. If you have any questions about your discharge medications or the care you received while you were in the hospital after you are discharged, you can call the unit and  asked to speak with the hospitalist on call if the hospitalist that took care of you is not available. Once you are discharged, your primary care physician will handle any further medical issues. Please note that NO REFILLS for any discharge medications will be authorized once you are discharged, as it is imperative that you return to your primary care physician (or establish a relationship with a primary care physician if you do not have one) for your aftercare needs so that they can reassess your need for medications and monitor your lab values.    Today   CHIEF COMPLAINT:   Chief Complaint  Patient presents with  . Chest Pain    HISTORY OF PRESENT ILLNESS:  Kimberly Shaffer  is a 67 y.o. female presented with chest pain and found to have NSTEMI   VITAL  SIGNS:  Blood pressure (!) 172/82, pulse 87, temperature 97.9 F (36.6 C), temperature source Oral, resp. rate (!) 23, height 5\' 6"  (1.676 m), weight 101.6 kg (224 lb), SpO2 100 %.   PHYSICAL EXAMINATION:  GENERAL:  68 y.o.-year-old patient lying in the bed with no acute distress.  EYES: Pupils equal, round, reactive to light and accommodation. No scleral icterus. Extraocular muscles intact.  HEENT: Head atraumatic, normocephalic. Oropharynx and nasopharynx clear.  NECK:  Supple, no jugular venous distention. No thyroid enlargement, no tenderness.  LUNGS: Normal breath sounds bilaterally, no wheezing, rales,rhonchi or crepitation. No use of accessory muscles of respiration.  CARDIOVASCULAR: S1, S2 normal. No murmurs, rubs, or gallops.  ABDOMEN: Soft, non-tender, non-distended. Bowel sounds present. No organomegaly or mass.  EXTREMITIES: No pedal edema, cyanosis, or clubbing.  NEUROLOGIC: Cranial nerves II through XII are intact. Muscle strength 5/5 in all extremities. Sensation intact. Gait not checked.  PSYCHIATRIC: The patient is alert and oriented x 3.  SKIN: No obvious rash, lesion, or ulcer.   DATA REVIEW:   CBC Recent Labs   Lab 03/23/18 0535  WBC 8.1  HGB 9.0*  HCT 28.3*  PLT 199    Chemistries  Recent Labs  Lab 03/23/18 0133 03/23/18 0535  NA 139 138  K 5.0 4.6  CL 103 104  CO2 24 21*  GLUCOSE 116* 99  BUN 40* 41*  CREATININE 6.15* 6.23*  CALCIUM 9.0 9.1  AST 19  --   ALT 9  --   ALKPHOS 53  --   BILITOT 0.5  --     Cardiac Enzymes Recent Labs  Lab 03/23/18 0847  TROPONINI 0.76*    Microbiology Results  Results for orders placed or performed during the hospital encounter of 02/26/18  MRSA PCR Screening     Status: None   Collection Time: 02/26/18  2:38 PM  Result Value Ref Range Status   MRSA by PCR NEGATIVE NEGATIVE Final    Comment:        The GeneXpert MRSA Assay (FDA approved for NASAL specimens only), is one component of a comprehensive MRSA colonization surveillance program. It is not intended to diagnose MRSA infection nor to guide or monitor treatment for MRSA infections. Performed at [facility], [address] [place], [place] [address]     RADIOLOGY:  Dg Chest Portable 1 View  Result Date: 03/23/2018 CLINICAL DATA:  Left-sided chest pain EXAM: PORTABLE CHEST 1 VIEW COMPARISON:  03/20/2018, 03/02/2018 FINDINGS: Mild cardiomegaly with vascular congestion and mild edema. No acute consolidation or effusion. Aortic atherosclerosis. No pneumothorax. IMPRESSION: Cardiomegaly with vascular congestion and mild edema. Electronically Signed   By: Donavan Foil M.D.   On: 03/23/2018 00:41      Management plans discussed with the patient, and she is in agreement.  I tried to reach daughter on the phone but I was hung up on.  CODE STATUS:     Code Status Orders  (From admission, onward)        Start     Ordered   03/23/18 0955  Do not attempt resuscitation (DNR)  Continuous    Question Answer Comment  In the event of cardiac or respiratory ARREST Do not call a "code blue"   In the event of cardiac or respiratory ARREST Do not perform Intubation, CPR,  defibrillation or ACLS   In the event of cardiac or respiratory ARREST Use medication by any route, position, wound care, and other measures to relive pain and suffering.  May use oxygen, suction and manual treatment of airway obstruction as needed for comfort.   Comments nurse may pronounce      03/23/18 0955    Code Status History    Date Active Date Inactive Code Status Order ID Comments User Context   03/23/2018 0500 03/23/2018 0955 Full Code [id number]  Arta Silence, MD Inpatient   03/20/2018 0605 03/21/2018 1313 Full Code [id number]  Harrie Foreman, MD Inpatient   03/02/2018 0313 03/03/2018 1952 Full Code [id number]  Lance Coon, MD ED   02/26/2018 0716 02/27/2018 1821 Full Code [id number]  Arta Silence, MD Inpatient   01/11/2018 2030 01/14/2018 1509 Full Code [id number]  Saundra Shelling, MD Inpatient   12/25/2017 0835 12/25/2017 1743 Full Code [id number]  Teodoro Spray, MD Inpatient   12/23/2017 0927 12/25/2017 0834 Full Code [id number]  Harrie Foreman, MD Inpatient   12/08/2017 0357 12/08/2017 2344 Full Code [id number]  Harrie Foreman, MD Inpatient   06/28/2017 0749 07/01/2017 2218 Full Code [id number]  Harrie Foreman, MD Inpatient   07/19/2016 0857 07/19/2016 1319 Full Code [id number]  Corey Skains, MD Inpatient   07/05/2016 2236 07/09/2016 1950 Full Code [id number]  Barclay, [place], DO Inpatient   12/17/2015 0812 12/20/2015 1957 Full Code [id number]  Saundra Shelling, MD Inpatient      TOTAL TIME TAKING CARE OF THIS PATIENT: 40 minutes.    Loletha Grayer M.D on 03/23/2018 at 1:57 PM  Between 7am to 6pm - Pager - [phone]  After 6pm go to [email or web address] - Proofreader  [employer] Office  [phone]  CC: Primary care physician; Clinic, [place]

## 2018-03-23 NOTE — Progress Notes (Signed)
IV fluids resumed and heparin resumed see MAR.  Dr Leslye Peer confirmed to keep them at the same rate in the [facility].  Patient will transfer to [facility], RN gave report to the nurse who will care for the patient there.  [facility]; [place] [facility] was called [place], RN

## 2018-03-23 NOTE — Progress Notes (Signed)
Patient ID: Kimberly Shaffer, female   DOB: 29-Jun-1951, 67 y.o.   MRN: [id number]  ACP note.  Patient present for conversation.  Diagnosis: NSTEMI with recurrent chest pain and history of coronary artery disease, end-stage renal disease, hypertension, depression, diabetes, COPD.  CODE STATUS discussed.  Patient wishes to be a DO NOT RESUSCITATE.  Plan.  Patient very tearful this morning when I was talking with her about things.  She states she is very tired and does not want to go on.  I will get a psychiatric consultation.  Cardiology took for cardiac catheterization and they recommended another CABG.  Nephrology to do dialysis on Saturday after cardiac catheterization today.  Time spent on ACP discussion and coordination of care 35 minutes Dr. Loletha Grayer

## 2018-03-23 NOTE — Progress Notes (Signed)
Patient has 3 vessel disease and advise CABG, LV gram deffered butecho had EF 50% in past. Will reat echo and d/c plavix and add isosorbide.

## 2018-03-23 NOTE — ED Notes (Signed)
Pt reports left sided HA 9/10, cont to deny any CP; MD aware of HA and meds ordered

## 2018-03-23 NOTE — Progress Notes (Signed)
Dr Sharmon Leyden Wednesday CABG spoke to [facility] at [place] surgery [phone]. Still need to be arranged by Hospitalist on call to tansfer to [facility] CT surgery

## 2018-03-23 NOTE — ED Triage Notes (Signed)
Pt to room 1 via EMS from home; reports onset left sided CP radiating into neck/jaw/arm accomp by [facility]; st pain relieved by NTG at home; reoccured enroute and 2nd NTG admin with relief; pt denies pain at present

## 2018-03-23 NOTE — ED Notes (Signed)
PCXR completed.

## 2018-03-23 NOTE — ED Provider Notes (Signed)
[facility] Emergency Department Provider Note   ____________________________________________   First MD Initiated Contact with Patient 03/23/18 0012     (approximate)  I have reviewed the triage vital signs and the nursing notes.   HISTORY  Chief Complaint Chest Pain    HPI Kimberly Shaffer is a 67 y.o. female who reports onset of left-sided chest pain rating and neck and left arm and jaw with shortness of breath.  Improved with one nitroglycerin and got worse second nitroglycerin was given by EMS and pain is resolved.  Patient takes one baby aspirin a day.  Patient was recently discharged for chest pain felt to be secondary to congestive failure.  Patient has dialysis.  Got dialysis today.   Past Medical History:  Diagnosis Date  . Asthma   . CAD (coronary artery disease)   . Chronic lower back pain   . COPD (chronic obstructive pulmonary disease) ([place])   . Depression   . Diabetes mellitus without complication (HCC)    NIDD  . H/O angina pectoris   . H/O blood clots   . Hypercholesteremia   . Hypertension   . Left rotator cuff tear   . Myocardial infarction ([place])   . Obesity   . Renal insufficiency   . Stroke ([place])   . Vertigo, aural     Patient Active Problem List   Diagnosis Date Noted  . NSTEMI (non-ST elevated myocardial infarction) ([place]) 03/23/2018  . Acute on chronic diastolic CHF (congestive heart failure) ([place]) 03/02/2018  . GERD (gastroesophageal reflux disease) 03/02/2018  . Respiratory failure ([place]) 01/11/2018  . Abdominal pain 06/28/2017  . Bilateral carotid artery stenosis 03/08/2017  . ESRD on dialysis ([place]) 03/08/2017  . Non-ST elevation myocardial infarction (NSTEMI), subendocardial infarction, subsequent episode of care ([place]) 11/04/2016  . Onychogryphosis 10/31/2016  . Onychomycosis 10/31/2016  . Subungual exostosis 10/31/2016  . Type 2 diabetes mellitus with diabetic neuropathy ([place]) 10/31/2016  . Chronic  osteomyelitis of left hand ([place]) 09/03/2016  . Left arm swelling 08/30/2016  . Pre-operative clearance 08/30/2016  . Coronary artery disease of native artery of native heart with stable angina pectoris ([place]) 08/03/2016  . Benign paroxysmal positional vertigo due to bilateral vestibular disorder 07/26/2016  . H/O: CVA (cerebrovascular accident) 07/26/2016  . Stable angina ([place]) 07/05/2016  . Sepsis due to pneumonia ([place]) 07/05/2016  . Fracture of left foot 04/26/2016  . Pre-transplant evaluation for kidney transplant 02/04/2016  . Chest pain at rest 12/17/2015  . Chest pain 12/17/2015  . DJD of shoulder 10/21/2015  . Perianal lesion 05/20/2015  . 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 Lumbar radiculopathy 05/14/2015  . 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 Benign essential hypertension 04/22/2015  . Grief 03/18/2015  . Lumbosacral facet joint syndrome 02/16/2015  . Greater trochanteric bursitis 02/16/2015  . DDD (degenerative disc disease), lumbosacral 01/20/2015  . 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 Osteoarthritis 01/20/2015  . IDA (iron deficiency anemia) 03/25/2014  . Thyroid nodule 01/15/2014  . Lung nodule 08/28/2013  . Tobacco abuse 05/17/2013  . Mixed hyperlipidemia 06/14/2011  . OSA on CPAP 06/14/2011  . PAD (peripheral artery disease) ([place]) 06/14/2011  . Depression 05/05/2011  . Chronic kidney disease (CKD), stage V ([place]) 03/26/2011  . Multiple vessel coronary artery disease 03/26/2011  . Obesity, unspecified 03/26/2011  . Type 2 diabetes mellitus ([place]) 03/26/2011    Past Surgical History:  Procedure Laterality Date  . A/V FISTULAGRAM Left 03/14/2017  Procedure: A/V Fistulagram;  Surgeon: Katha Cabal, MD;  Location: [place] [facility];  Service: Cardiovascular;  Laterality: Left;  . AV FISTULA PLACEMENT Left 11-04-2015  . AV FISTULA PLACEMENT Left 11/04/2015   Procedure: ARTERIOVENOUS (AV) FISTULA CREATION   ( BRACHIAL CEPHALIC );  Surgeon: Katha Cabal, MD;  Location: [facility];  Service: Vascular;  Laterality: Left;  . CARDIAC CATHETERIZATION    . CARDIAC CATHETERIZATION Left 07/19/2016   Procedure: Left Heart Cath and Coronary Angiography;  Surgeon: Corey Skains, MD;  Location: [place] [facility];  Service: Cardiovascular;  Laterality: Left;  . CORONARY ARTERY BYPASS GRAFT  2012  . CORONARY STENT PLACEMENT    . JOINT REPLACEMENT    . KNEE SURGERY Bilateral   . LEFT HEART CATH AND CORONARY ANGIOGRAPHY N/A 12/25/2017   Procedure: LEFT HEART CATH AND CORONARY ANGIOGRAPHY;  Surgeon: Teodoro Spray, MD;  Location: [place] [facility];  Service: Cardiovascular;  Laterality: N/A;  . PERIPHERAL VASCULAR CATHETERIZATION N/A 11/13/2015   Procedure: Dialysis/Perma Catheter Insertion;  Surgeon: Katha Cabal, MD;  Location: [place] [facility];  Service: Cardiovascular;  Laterality: N/A;  . PERIPHERAL VASCULAR CATHETERIZATION N/A 12/04/2015   Procedure: Dialysis/Perma Catheter Insertion;  Surgeon: Katha Cabal, MD;  Location: [place] [facility];  Service: Cardiovascular;  Laterality: N/A;  . PERIPHERAL VASCULAR CATHETERIZATION Left 12/31/2015   Procedure: A/V Shuntogram/Fistulagram;  Surgeon: Algernon Huxley, MD;  Location: [place] [facility];  Service: Cardiovascular;  Laterality: Left;  . PERIPHERAL VASCULAR CATHETERIZATION N/A 12/31/2015   Procedure: A/V Shunt Intervention;  Surgeon: Algernon Huxley, MD;  Location: [place] [facility];  Service: Cardiovascular;  Laterality: N/A;  . PERIPHERAL VASCULAR CATHETERIZATION Left 02/25/2016   Procedure: A/V Shuntogram/Fistulagram;  Surgeon: Algernon Huxley, MD;  Location: [place] [facility];  Service: Cardiovascular;  Laterality: Left;  . PERIPHERAL VASCULAR CATHETERIZATION N/A 03/18/2016   Procedure: Dialysis/Perma Catheter Removal;  Surgeon: Katha Cabal, MD;  Location: [place] [facility];  Service: Cardiovascular;  Laterality: N/A;  . REMOVAL  OF A DIALYSIS CATHETER N/A 06/30/2017   Procedure: REMOVAL OF A DIALYSIS CATHETER;  Surgeon: Katha Cabal, MD;  Location: [facility];  Service: Vascular;  Laterality: N/A;  . REPLACEMENT TOTAL KNEE BILATERAL Bilateral     Prior to Admission medications   Medication Sig Start Date End Date Taking? Authorizing Provider  albuterol (PROVENTIL HFA;VENTOLIN HFA) 108 (90 BASE) MCG/ACT inhaler Inhale 2 puffs into the lungs every 6 (six) hours as needed for wheezing or shortness of breath.    Yes [provider]  aspirin EC 81 MG tablet Take 81 mg by mouth daily.   Yes [provider]  calcium carbonate (OSCAL) 1500 (600 Ca) MG TABS tablet Take 600 mg of elemental calcium by mouth daily with breakfast.   Yes [provider]  clopidogrel (PLAVIX) 75 MG tablet Take 1 tablet by mouth daily. 01/03/18  Yes [provider]  gabapentin (NEURONTIN) 100 MG capsule Take 100 mg by mouth at bedtime.   Yes [provider]  methocarbamol (ROBAXIN) 750 MG tablet Take 750 mg by mouth 2 (two) times daily as needed for muscle spasms.    Yes [provider]  nitroGLYCERIN (NITROLINGUAL) 0.4 MG/SPRAY spray Place 2 sprays under the tongue every 5 (five) minutes x 3 doses as needed for chest pain.    Yes [provider]  Oxycodone HCl 10 MG TABS Take 10 mg by mouth every 6 (six)  hours as needed (pain).  12/28/17  Yes [provider]  pantoprazole (PROTONIX) 40 MG tablet Take 40 mg by mouth 2 (two) times daily.   Yes [provider]  RENVELA 2.4 g PACK Take 2.4 g by mouth 2 (two) times daily. 01/14/18  Yes Fritzi Mandes, MD  rosuvastatin (CRESTOR) 20 MG tablet Take 1 tablet (20 mg total) by mouth daily. 03/03/18  Yes Pyreddy, Reatha Harps, MD  SENSIPAR 30 MG tablet Take 1 tablet (30 mg total) by mouth daily. 01/14/18  Yes Fritzi Mandes, MD  SPIRIVA HANDIHALER 18 MCG inhalation capsule Place 1 capsule into inhaler and inhale daily.   Yes [provider]   SYMBICORT 160-4.5 MCG/ACT inhaler Inhale 2 puffs into the lungs 2 (two) times daily.   Yes [provider]  torsemide (DEMADEX) 100 MG tablet Take 1 tablet (100 mg total) by mouth daily. 03/21/18  Yes Demetrios Loll, MD  venlafaxine XR (EFFEXOR-XR) 37.5 MG 24 hr capsule Take 37.5 mg by mouth daily with breakfast.   Yes [provider]  Nutritional Supplements (FEEDING SUPPLEMENT, NEPRO CARB STEADY,) LIQD Take 237 mLs by mouth daily. 02/27/18   Hillary Bow, MD    Allergies Nystatin; Sulfa antibiotics; and Niaspan [niacin]  Family History  Problem Relation Age of Onset  . Cancer Mother   . Cancer Father   . Diabetes Brother   . Heart disease Brother     Social History Social History   Tobacco Use  . Smoking status: Current Every Day Smoker    Packs/day: 0.25    Types: Cigarettes  . Smokeless tobacco: Never Used  Substance Use Topics  . Alcohol use: No    Alcohol/week: 0.0 oz  . Drug use: No    Review of Systems  Constitutional: No fever/chills Eyes: No visual changes. ENT: No sore throat. Cardiovascular: Denies chest pain at present. Respiratory: Denies shortness of breath at present. Gastrointestinal: No abdominal pain.  No nausea, no vomiting.  No diarrhea.  No constipation. Genitourinary: Negative for dysuria. Musculoskeletal: Negative for back pain. Skin: Negative for rash. Neurological: Negative for headaches, focal weakness   ____________________________________________   PHYSICAL EXAM:  VITAL SIGNS: ED Triage Vitals [03/23/18 0018]  Enc Vitals Group     BP      Pulse      Resp      Temp      Temp src      SpO2      Weight 221 lb 12.5 oz (100.6 kg)     Height 5\' 6"  (1.676 m)     Head Circumference      Peak Flow      Pain Score 0     Pain Loc      Pain Edu?      Excl. in [place]?     Constitutional: Alert and oriented. Well appearing and in no acute distress. Eyes: Conjunctivae are normal.  Head: Atraumatic. Nose: No  congestion/rhinnorhea. Mouth/Throat: Mucous membranes are moist.  Oropharynx non-erythematous. Neck: No stridor.  Cardiovascular: Normal rate, regular rhythm. Grossly normal heart sounds.  Good peripheral circulation. Respiratory: Normal respiratory effort.  No retractions. Lungs CTAB. Gastrointestinal: Soft and nontender. No distention. No abdominal bruits. No CVA tenderness. Musculoskeletal: No lower extremity tenderness nor edema.  No joint effusions. Neurologic:  Normal speech and language. No gross focal neurologic deficits are appreciated. No gait instability. Skin:  Skin is warm, dry and intact. No rash noted. Psychiatric: Mood and affect are normal. Speech and behavior are normal.  ____________________________________________   LABS (all labs ordered are listed, but only abnormal results are displayed)  Labs Reviewed  CBC WITH DIFFERENTIAL/PLATELET - Abnormal; Notable for the following components:      Result Value   RBC 2.90 (*)    Hemoglobin 8.9 (*)    HCT 27.1 (*)    RDW 17.3 (*)    All other components within normal limits  CBC - Abnormal; Notable for the following components:   RBC 3.00 (*)    Hemoglobin 9.0 (*)    HCT 28.3 (*)    RDW 17.4 (*)    All other components within normal limits  COMPREHENSIVE METABOLIC PANEL - Abnormal; Notable for the following components:   Glucose, Bld 116 (*)    BUN 40 (*)    Creatinine, Ser 6.15 (*)    Total Protein 8.2 (*)    GFR calc non Af Amer 6 (*)    GFR calc Af Amer 7 (*)    All other components within normal limits  TROPONIN I - Abnormal; Notable for the following components:   Troponin I 0.24 (*)    All other components within normal limits  PROTIME-INR  APTT  GLUCOSE, CAPILLARY  HEPARIN LEVEL (UNFRACTIONATED)  TROPONIN I  TROPONIN I   ____________________________________________  EKG EKG read and interpreted by me shows ST segment flattening and downsloping laterally with inverted T waves in 1 and L and actually  a little bit of ST segment depression in lead I.  Second EKG done minutes later to get right-sided leads show ST segment depression in lead I slightly worse than previously there is also  ____________________________________________  RADIOLOGY  ED MD interpretation: Chest x-ray read by radiology is cardiomegaly with mild vascular edema.  I reviewed the film  Official radiology report(s): Dg Chest Portable 1 View  Result Date: 03/23/2018 CLINICAL DATA:  Left-sided chest pain EXAM: PORTABLE CHEST 1 VIEW COMPARISON:  03/20/2018, 03/02/2018 FINDINGS: Mild cardiomegaly with vascular congestion and mild edema. No acute consolidation or effusion. Aortic atherosclerosis. No pneumothorax. IMPRESSION: Cardiomegaly with vascular congestion and mild edema. Electronically Signed   By: Donavan Foil M.D.   On: 03/23/2018 00:41    ____________________________________________   PROCEDURES  Procedure(s) performed:   Procedures  Critica ____________________________________________   INITIAL IMPRESSION / ASSESSMENT AND PLAN / ED COURSE  Patient is having dynamic EKG changes she has some slight ST elevation but is not significant.  I discussed the patient with Dr. Clayborn Bigness who is on-call for STEMI tonight since he was in the hospital.  We will get her on aspirin and heparin and get her in the hospital again.  She had a cath in April that showed multiple blockages.      ____________________________________________   FINAL CLINICAL IMPRESSION(S) / ED DIAGNOSES  Final diagnoses:  Abnormal EKG  NSTEMI (non-ST elevated myocardial infarction) [facility])     ED Discharge Orders    None       Note:  This document was prepared using Dragon voice recognition software and may include unintentional dictation errors.    Nena Polio, MD 03/23/18 [phone]

## 2018-03-23 NOTE — H&P (Signed)
[place] [facility] [place] NAME: Kimberly Shaffer    MR#:  [id number]  DATE OF BIRTH:  1951-03-20  DATE OF ADMISSION:  03/23/2018  PRIMARY CARE PHYSICIAN: [facility]   REQUESTING/REFERRING PHYSICIAN: Nena Polio, MD  CHIEF COMPLAINT:   Chief Complaint  Patient presents with  . Chest Pain    HISTORY OF PRESENT ILLNESS:  Kimberly Shaffer  is a 67 y.o. female with a known history of known multi-vessel CAD (per 12/25/2017 cardiac cath), chronic diastolic CHF (EF 37-62% w/ grade I diastolic dysfxn as of 83/15/1761 Echo), DM, HTN, HLD, ESRD (HD T/T/S via LUE AVF) p/w CP. Pt was admitted to the hospital from 07/02-07/03 for chest pain and pulmonary congestion/interstitial edema. She received inpt HD on 07/02. She was discharged on 07/03, and had outpt HD on 07/04. She states that on 07/04 evening, she went out to get her grandson a pizza from [employer]. She states she got out of the car to walk to the shop, and developed L-sided CP radiating to the L neck, shoulder and arm. She denies radiation to the jaw or the back. She states she went back to the car and sat down, but the pain did not improve. She states it was severe, constant in severity, and not affected by exertion. He describes it as a dull constant nagging pain, non-reproducible, non-positional, non-pleuritic. She endorses associated fatigue/malaise/generalized weakness, lightheadedness, nausea, and states she vomited once when she got home (she did not visualize the vomitus). She denies SOB, palpitations, diaphoresis, LOC.  12/25/2017 Cath report: " -Ost LAD to Mid LAD lesion is 20% stenosed. -Mid LAD lesion is 40% stenosed. -Dist LAD-1 lesion is 70% stenosed. -Dist LAD-2 lesion is 65% stenosed. Colon Flattery 2nd Diag to 2nd Diag lesion is 45% stenosed. -Ramus lesion is 75% stenosed. -Ost Cx to Dist Cx lesion is 20% stenosed. -Dist Cx lesion is 30% stenosed. -Prox RCA lesion is 100%  stenosed. -Dist RCA lesion is 100% stenosed. -LIMA .[sic] -Prox Graft lesion is 100% stenosed.   Occluded rca with collaterals from left Patent stent from lm into proximal lad, patent proximal lad stent, diffuse distal lad disease, noncritical. Occluded lima to mid lad Patent stent in proximal lcx.  50-60% stenosis in proximal ri. Medical management. "  EKG (+) ST depressions. Trop-I 0.24. Pt appears comfortable and is in no acute distress at the time of my examination. Denies active CP. Cardiology consulted from ED, pt started on heparin gtt.  PAST MEDICAL HISTORY:   Past Medical History:  Diagnosis Date  . Asthma   . CAD (coronary artery disease)   . Chronic lower back pain   . COPD (chronic obstructive pulmonary disease) ([place])   . Depression   . Diabetes mellitus without complication (HCC)    NIDD  . H/O angina pectoris   . H/O blood clots   . Hypercholesteremia   . Hypertension   . Left rotator cuff tear   . Myocardial infarction ([place])   . Obesity   . Renal insufficiency   . Stroke ([place])   . Vertigo, aural     PAST SURGICAL HISTORY:   Past Surgical History:  Procedure Laterality Date  . A/V FISTULAGRAM Left 03/14/2017   Procedure: A/V Fistulagram;  Surgeon: Katha Cabal, MD;  Location: [place] [facility];  Service: Cardiovascular;  Laterality: Left;  . AV FISTULA PLACEMENT Left 11-04-2015  . AV FISTULA PLACEMENT Left 11/04/2015   Procedure: ARTERIOVENOUS (AV) FISTULA  CREATION  ( BRACHIAL CEPHALIC );  Surgeon: Katha Cabal, MD;  Location: [facility];  Service: Vascular;  Laterality: Left;  . CARDIAC CATHETERIZATION    . CARDIAC CATHETERIZATION Left 07/19/2016   Procedure: Left Heart Cath and Coronary Angiography;  Surgeon: Corey Skains, MD;  Location: [place] [facility];  Service: Cardiovascular;  Laterality: Left;  . CORONARY ARTERY BYPASS GRAFT  2012  . CORONARY STENT PLACEMENT    . JOINT REPLACEMENT    . KNEE SURGERY Bilateral   . LEFT HEART  CATH AND CORONARY ANGIOGRAPHY N/A 12/25/2017   Procedure: LEFT HEART CATH AND CORONARY ANGIOGRAPHY;  Surgeon: Teodoro Spray, MD;  Location: [place] [facility];  Service: Cardiovascular;  Laterality: N/A;  . PERIPHERAL VASCULAR CATHETERIZATION N/A 11/13/2015   Procedure: Dialysis/Perma Catheter Insertion;  Surgeon: Katha Cabal, MD;  Location: [facility];  Service: Cardiovascular;  Laterality: N/A;  . PERIPHERAL VASCULAR CATHETERIZATION N/A 12/04/2015   Procedure: Dialysis/Perma Catheter Insertion;  Surgeon: Katha Cabal, MD;  Location: [facility];  Service: Cardiovascular;  Laterality: N/A;  . PERIPHERAL VASCULAR CATHETERIZATION Left 12/31/2015   Procedure: A/V Shuntogram/Fistulagram;  Surgeon: Algernon Huxley, MD;  Location: [facility];  Service: Cardiovascular;  Laterality: Left;  . PERIPHERAL VASCULAR CATHETERIZATION N/A 12/31/2015   Procedure: A/V Shunt Intervention;  Surgeon: Algernon Huxley, MD;  Location: [place] [facility];  Service: Cardiovascular;  Laterality: N/A;  . PERIPHERAL VASCULAR CATHETERIZATION Left 02/25/2016   Procedure: A/V Shuntogram/Fistulagram;  Surgeon: Algernon Huxley, MD;  Location: [facility];  Service: Cardiovascular;  Laterality: Left;  . PERIPHERAL VASCULAR CATHETERIZATION N/A 03/18/2016   Procedure: Dialysis/Perma Catheter Removal;  Surgeon: Katha Cabal, MD;  Location: [facility];  Service: Cardiovascular;  Laterality: N/A;  . REMOVAL OF A DIALYSIS CATHETER N/A 06/30/2017   Procedure: REMOVAL OF A DIALYSIS CATHETER;  Surgeon: Katha Cabal, MD;  Location: [facility];  Service: Vascular;  Laterality: N/A;  . REPLACEMENT TOTAL KNEE BILATERAL Bilateral     SOCIAL HISTORY:   Social History   Tobacco Use  . Smoking status: Current Every Day Smoker    Packs/day: 0.25    Types: Cigarettes  . Smokeless tobacco: Never Used  Substance Use Topics  . Alcohol use: No    Alcohol/week: 0.0 oz    FAMILY HISTORY:     Family History  Problem Relation Age of Onset  . Cancer Mother   . Cancer Father   . Diabetes Brother   . Heart disease Brother     DRUG ALLERGIES:   Allergies  Allergen Reactions  . Nystatin Hives and Itching  . Sulfa Antibiotics Swelling, Hives and Rash  . Niaspan [Niacin] Other (See Comments) and Hives    REVIEW OF SYSTEMS:   Review of Systems  Constitutional: Positive for malaise/fatigue. Negative for chills, diaphoresis, fever and weight loss.  HENT: Negative for congestion, ear pain, hearing loss, nosebleeds, sinus pain, sore throat and tinnitus.   Eyes: Negative for blurred vision, double vision and photophobia.  Respiratory: Negative for cough, hemoptysis, sputum production, shortness of breath and wheezing.   Cardiovascular: Positive for chest pain. Negative for palpitations, orthopnea, claudication, leg swelling and PND.  Gastrointestinal: Positive for nausea and vomiting. Negative for abdominal pain, blood in stool, constipation, diarrhea, heartburn and melena.  Genitourinary: Negative for dysuria, frequency, hematuria and urgency.  Musculoskeletal: Negative for back pain, joint pain, myalgias and neck pain.  Skin: Negative for itching  and rash.  Neurological: Positive for dizziness and weakness. Negative for tingling, tremors, sensory change, speech change, focal weakness, seizures, loss of consciousness and headaches.  Psychiatric/Behavioral: Negative for memory loss. The patient does not have insomnia.    MEDICATIONS AT HOME:   Prior to Admission medications   Medication Sig Start Date End Date Taking? Authorizing Provider  albuterol (PROVENTIL HFA;VENTOLIN HFA) 108 (90 BASE) MCG/ACT inhaler Inhale 2 puffs into the lungs every 6 (six) hours as needed for wheezing or shortness of breath.    Yes [provider]  aspirin EC 81 MG tablet Take 81 mg by mouth daily.   Yes [provider]  calcium carbonate (OSCAL) 1500 (600 Ca) MG TABS tablet Take  600 mg of elemental calcium by mouth daily with breakfast.   Yes [provider]  clopidogrel (PLAVIX) 75 MG tablet Take 1 tablet by mouth daily. 01/03/18  Yes [provider]  gabapentin (NEURONTIN) 100 MG capsule Take 100 mg by mouth at bedtime.   Yes [provider]  methocarbamol (ROBAXIN) 750 MG tablet Take 750 mg by mouth 2 (two) times daily as needed for muscle spasms.    Yes [provider]  nitroGLYCERIN (NITROLINGUAL) 0.4 MG/SPRAY spray Place 2 sprays under the tongue every 5 (five) minutes x 3 doses as needed for chest pain.    Yes [provider]  Oxycodone HCl 10 MG TABS Take 10 mg by mouth every 6 (six) hours as needed (pain).  12/28/17  Yes [provider]  pantoprazole (PROTONIX) 40 MG tablet Take 40 mg by mouth 2 (two) times daily.   Yes [provider]  RENVELA 2.4 g PACK Take 2.4 g by mouth 2 (two) times daily. 01/14/18  Yes Fritzi Mandes, MD  rosuvastatin (CRESTOR) 20 MG tablet Take 1 tablet (20 mg total) by mouth daily. 03/03/18  Yes Pyreddy, Reatha Harps, MD  SENSIPAR 30 MG tablet Take 1 tablet (30 mg total) by mouth daily. 01/14/18  Yes Fritzi Mandes, MD  SPIRIVA HANDIHALER 18 MCG inhalation capsule Place 1 capsule into inhaler and inhale daily.   Yes [provider]  SYMBICORT 160-4.5 MCG/ACT inhaler Inhale 2 puffs into the lungs 2 (two) times daily.   Yes [provider]  torsemide (DEMADEX) 100 MG tablet Take 1 tablet (100 mg total) by mouth daily. 03/21/18  Yes Demetrios Loll, MD  venlafaxine XR (EFFEXOR-XR) 37.5 MG 24 hr capsule Take 37.5 mg by mouth daily with breakfast.   Yes [provider]  Nutritional Supplements (FEEDING SUPPLEMENT, NEPRO CARB STEADY,) LIQD Take 237 mLs by mouth daily. 02/27/18   Hillary Bow, MD      VITAL SIGNS:  Blood pressure (!) 169/74, pulse 80, temperature 97.9 F (36.6 C), temperature source Oral, resp. rate 17, height 5\' 6"  (1.676 m), weight 101.6 kg (224 lb), SpO2 98  %.  PHYSICAL EXAMINATION:  Physical Exam  Constitutional: She is oriented to person, place, and time. She appears well-developed and well-nourished. She is active and cooperative.  Non-toxic appearance. She does not appear ill. No distress. She is not intubated.  HENT:  Head: Normocephalic and atraumatic.  Mouth/Throat: Oropharynx is clear and moist. No oropharyngeal exudate.  Eyes: Conjunctivae, EOM and lids are normal. No scleral icterus.  Neck: Neck supple. No JVD present. No thyromegaly present.  Cardiovascular: Normal rate, regular rhythm, S1 normal and S2 normal.  No extrasystoles are present. PMI is not displaced. Exam reveals no gallop, no S3, no S4, no distant heart sounds and  no friction rub.  Murmur heard.  Systolic murmur is present with a grade of 2/6. Pulmonary/Chest: Effort normal. No accessory muscle usage or stridor. No apnea, no tachypnea and no bradypnea. She is not intubated. No respiratory distress. She has decreased breath sounds in the right upper field, the right middle field, the right lower field, the left upper field, the left middle field and the left lower field. She has no wheezes. She has no rhonchi. She has no rales.  Abdominal: Soft. Bowel sounds are normal. She exhibits no distension. There is no tenderness. There is no rebound and no guarding.  Musculoskeletal: Normal range of motion. She exhibits no edema or tenderness.       Right lower leg: Normal. She exhibits no tenderness and no edema.       Left lower leg: Normal. She exhibits no tenderness and no edema.  Lymphadenopathy:    She has no cervical adenopathy.  Neurological: She is alert and oriented to person, place, and time. She is not disoriented.  Skin: Skin is warm, dry and intact. No rash noted. She is not diaphoretic. No erythema.  Psychiatric: She has a normal mood and affect. Her behavior is normal. Judgment and thought content normal. Her mood appears not anxious. She is not agitated.    LABORATORY PANEL:   CBC Recent Labs  Lab 03/23/18 0043  WBC 8.1  HGB 8.9*  HCT 27.1*  PLT 265   ------------------------------------------------------------------------------------------------------------------  Chemistries  Recent Labs  Lab 03/23/18 0133  NA 139  K 5.0  CL 103  CO2 24  GLUCOSE 116*  BUN 40*  CREATININE 6.15*  CALCIUM 9.0  AST 19  ALT 9  ALKPHOS 53  BILITOT 0.5   ------------------------------------------------------------------------------------------------------------------  Cardiac Enzymes Recent Labs  Lab 03/23/18 0133  TROPONINI 0.24*   ------------------------------------------------------------------------------------------------------------------  RADIOLOGY:  Dg Chest Portable 1 View  Result Date: 03/23/2018 CLINICAL DATA:  Left-sided chest pain EXAM: PORTABLE CHEST 1 VIEW COMPARISON:  03/20/2018, 03/02/2018 FINDINGS: Mild cardiomegaly with vascular congestion and mild edema. No acute consolidation or effusion. Aortic atherosclerosis. No pneumothorax. IMPRESSION: Cardiomegaly with vascular congestion and mild edema. Electronically Signed   By: Donavan Foil M.D.   On: 03/23/2018 00:41   IMPRESSION AND PLAN:   A/P: 63F w/ ESRD + known multi-vessel CAD (on recent 12/25/2017 cardiac cath) p/w CP/unstable angina. Trop-I 0.24, EKG (+) ST depressions, NSTEMI. Also w/ hyperglycemia (w/ T2NIDDM), ESRD, normocytic anemia. -Trop-I 0.24, rpt pending -Tele, continuous cardiac monitoring -Pulse ox -O2 -Morphine, NTG PRN -PQZ30 -c/w Plavix, Statin, beta blocker (on Toprol XL 100 qD) -Not on ACE-I/ARB -Started on heparin gtt in ED -Cardiology consult, may need rpt cath -Had HD on 07/04, next HD due Sat 07/06 (Nephrology not consulted); c/w Sensipar, Renvela -SSI -c/w other home meds -NPO for now, await Cardiology recommendations -Heparin gtt -Full code -Admission, > 2 midnights   All the records are reviewed and case discussed with ED  provider. Management plans discussed with the patient, family and they are in agreement.  CODE STATUS: Full code  TOTAL TIME TAKING CARE OF THIS PATIENT: 75 minutes.    Arta Silence M.D on 03/23/2018 at 6:09 AM  Between 7am to 6pm - Pager - [phone]  After 6pm go to [email or web address] - Proofreader  [facility]  Office  [phone]  CC: Primary care physician; [facility]   Note: This dictation was prepared with Dragon dictation along with smaller phrase technology. Any transcriptional errors that result from  this process are unintentional.

## 2018-03-23 NOTE — Consult Note (Signed)
Psychiatry: Patient's chart reviewed.  Spoke with hospitalist.  Came by to see her midday today and she was still away at a procedure.  I will try to follow up with her again before the end of the day if she has not been transferred by that point.

## 2018-03-23 NOTE — Progress Notes (Signed)
*  PRELIMINARY RESULTS* Echocardiogram 2D Echocardiogram has been performed.  Sherrie Sport 03/23/2018, 2:35 PM

## 2018-03-23 NOTE — Progress Notes (Signed)
Pt was admitted on the floor with any distress and no complaints of chest pain. VSS. Will continue to monitor. Pt resting at this time.

## 2018-03-23 NOTE — ED Notes (Signed)
Pt sleeping soundly with no distress

## 2018-03-24 DIAGNOSIS — D638 Anemia in other chronic diseases classified elsewhere: Secondary | ICD-10-CM | POA: Insufficient documentation

## 2018-04-03 ENCOUNTER — Encounter: Payer: Self-pay | Admitting: Emergency Medicine

## 2018-04-03 DIAGNOSIS — Z79899 Other long term (current) drug therapy: Secondary | ICD-10-CM | POA: Diagnosis not present

## 2018-04-03 DIAGNOSIS — I1311 Hypertensive heart and chronic kidney disease without heart failure, with stage 5 chronic kidney disease, or end stage renal disease: Secondary | ICD-10-CM | POA: Insufficient documentation

## 2018-04-03 DIAGNOSIS — I5042 Chronic combined systolic (congestive) and diastolic (congestive) heart failure: Secondary | ICD-10-CM | POA: Diagnosis not present

## 2018-04-03 DIAGNOSIS — N186 End stage renal disease: Secondary | ICD-10-CM | POA: Insufficient documentation

## 2018-04-03 DIAGNOSIS — K1379 Other lesions of oral mucosa: Secondary | ICD-10-CM | POA: Diagnosis present

## 2018-04-03 DIAGNOSIS — F17210 Nicotine dependence, cigarettes, uncomplicated: Secondary | ICD-10-CM | POA: Insufficient documentation

## 2018-04-03 DIAGNOSIS — E1140 Type 2 diabetes mellitus with diabetic neuropathy, unspecified: Secondary | ICD-10-CM | POA: Insufficient documentation

## 2018-04-03 DIAGNOSIS — J449 Chronic obstructive pulmonary disease, unspecified: Secondary | ICD-10-CM | POA: Insufficient documentation

## 2018-04-03 DIAGNOSIS — I252 Old myocardial infarction: Secondary | ICD-10-CM | POA: Insufficient documentation

## 2018-04-03 DIAGNOSIS — B370 Candidal stomatitis: Secondary | ICD-10-CM | POA: Insufficient documentation

## 2018-04-03 DIAGNOSIS — Z7982 Long term (current) use of aspirin: Secondary | ICD-10-CM | POA: Diagnosis not present

## 2018-04-03 DIAGNOSIS — Z992 Dependence on renal dialysis: Secondary | ICD-10-CM | POA: Insufficient documentation

## 2018-04-03 DIAGNOSIS — I25810 Atherosclerosis of coronary artery bypass graft(s) without angina pectoris: Secondary | ICD-10-CM | POA: Diagnosis not present

## 2018-04-03 NOTE — ED Triage Notes (Signed)
Pt with white coating and lesions to the tongue x3 days. Pt reports burning to the area.

## 2018-04-04 ENCOUNTER — Emergency Department
Admission: EM | Admit: 2018-04-04 | Discharge: 2018-04-04 | Disposition: A | Discharge status: Home / Self Care | Payer: Medicare Other | Attending: Emergency Medicine | Admitting: Emergency Medicine

## 2018-04-04 DIAGNOSIS — B370 Candidal stomatitis: Secondary | ICD-10-CM

## 2018-04-04 MED ORDER — CLOTRIMAZOLE 10 MG MT TROC: 10.0000 mg | Freq: Every day | OROMUCOSAL | 0 refills | Status: DC

## 2018-04-04 NOTE — ED Provider Notes (Signed)
[facility] Emergency Department Provider Note ____   First MD Initiated Contact with Patient 04/04/18 0059     (approximate)  I have reviewed the triage vital signs and the nursing notes.   HISTORY  Chief Complaint Mouth Lesions    HPI Kimberly Shaffer is a 67 y.o. female with below list of chronic medical conditions presents to the emergency department with a 3-day history of white coating on her tongue that is uncomfortable per the patient.  Patient states hurts to eat.  Patient does have inhaled corticosteroid secondary to asthma and COPD. Past Medical History:  Diagnosis Date  . Asthma   . CAD (coronary artery disease)   . Chronic lower back pain   . COPD (chronic obstructive pulmonary disease) ([place])   . Depression   . Diabetes mellitus without complication (HCC)    NIDD  . H/O angina pectoris   . H/O blood clots   . Hypercholesteremia   . Hypertension   . Left rotator cuff tear   . Myocardial infarction ([place])   . Obesity   . Renal insufficiency   . Stroke ([place])   . Vertigo, aural     Patient Active Problem List   Diagnosis Date Noted  . NSTEMI (non-ST elevated myocardial infarction) ([place]) 03/23/2018  . Acute on chronic diastolic CHF (congestive heart failure) ([place]) 03/02/2018  . GERD (gastroesophageal reflux disease) 03/02/2018  . Respiratory failure ([place]) 01/11/2018  . Abdominal pain 06/28/2017  . Bilateral carotid artery stenosis 03/08/2017  . ESRD on dialysis ([place]) 03/08/2017  . Non-ST elevation myocardial infarction (NSTEMI), subendocardial infarction, subsequent episode of care ([place]) 11/04/2016  . Onychogryphosis 10/31/2016  . Onychomycosis 10/31/2016  . Subungual exostosis 10/31/2016  . Type 2 diabetes mellitus with diabetic neuropathy ([place]) 10/31/2016  . Chronic osteomyelitis of left hand ([place]) 09/03/2016  . Left arm swelling 08/30/2016  . Pre-operative clearance 08/30/2016  . Coronary artery disease of native artery of  native heart with stable angina pectoris ([place]) 08/03/2016  . Benign paroxysmal positional vertigo due to bilateral vestibular disorder 07/26/2016  . H/O: CVA (cerebrovascular accident) 07/26/2016  . Stable angina ([place]) 07/05/2016  . Sepsis due to pneumonia ([place]) 07/05/2016  . Fracture of left foot 04/26/2016  . Pre-transplant evaluation for kidney transplant 02/04/2016  . Chest pain at rest 12/17/2015  . Chest pain 12/17/2015  . DJD of shoulder 10/21/2015  . Perianal lesion 05/20/2015  . 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 Lumbar radiculopathy 05/14/2015  . 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 Benign essential hypertension 04/22/2015  . Grief 03/18/2015  . Lumbosacral facet joint syndrome 02/16/2015  . Greater trochanteric bursitis 02/16/2015  . DDD (degenerative disc disease), lumbosacral 01/20/2015  . 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 Osteoarthritis 01/20/2015  . IDA (iron deficiency anemia) 03/25/2014  . Thyroid nodule 01/15/2014  . Lung nodule 08/28/2013  . Tobacco abuse 05/17/2013  . Mixed hyperlipidemia 06/14/2011  . OSA on CPAP 06/14/2011  . PAD (peripheral artery disease) ([place]) 06/14/2011  . Depression 05/05/2011  . Chronic kidney disease (CKD), stage V ([place]) 03/26/2011  . Multiple vessel coronary artery disease 03/26/2011  . Obesity, unspecified 03/26/2011  . Type 2 diabetes mellitus ([place]) 03/26/2011    Past Surgical History:  Procedure Laterality Date  . A/V FISTULAGRAM Left 03/14/2017   Procedure: A/V Fistulagram;  Surgeon: Katha Cabal, MD;  Location: [place] [facility];  Service: Cardiovascular;  Laterality: Left;  . AV FISTULA  PLACEMENT Left 11-04-2015  . AV FISTULA PLACEMENT Left 11/04/2015   Procedure: ARTERIOVENOUS (AV) FISTULA CREATION  ( BRACHIAL CEPHALIC );  Surgeon: Katha Cabal, MD;  Location: [facility];  Service: Vascular;  Laterality: Left;  . CARDIAC CATHETERIZATION    . CARDIAC  CATHETERIZATION Left 07/19/2016   Procedure: Left Heart Cath and Coronary Angiography;  Surgeon: Corey Skains, MD;  Location: [place] [facility];  Service: Cardiovascular;  Laterality: Left;  . CORONARY ARTERY BYPASS GRAFT  2012  . CORONARY STENT PLACEMENT    . JOINT REPLACEMENT    . KNEE SURGERY Bilateral   . LEFT HEART CATH AND CORONARY ANGIOGRAPHY N/A 12/25/2017   Procedure: LEFT HEART CATH AND CORONARY ANGIOGRAPHY;  Surgeon: Teodoro Spray, MD;  Location: [place] [facility];  Service: Cardiovascular;  Laterality: N/A;  . LEFT HEART CATH AND CORONARY ANGIOGRAPHY Right 03/23/2018   Procedure: LEFT HEART CATH AND CORONARY ANGIOGRAPHY;  Surgeon: Dionisio David, MD;  Location: [place] [facility];  Service: Cardiovascular;  Laterality: Right;  . PERIPHERAL VASCULAR CATHETERIZATION N/A 11/13/2015   Procedure: Dialysis/Perma Catheter Insertion;  Surgeon: Katha Cabal, MD;  Location: [place] [facility];  Service: Cardiovascular;  Laterality: N/A;  . PERIPHERAL VASCULAR CATHETERIZATION N/A 12/04/2015   Procedure: Dialysis/Perma Catheter Insertion;  Surgeon: Katha Cabal, MD;  Location: [place] [facility];  Service: Cardiovascular;  Laterality: N/A;  . PERIPHERAL VASCULAR CATHETERIZATION Left 12/31/2015   Procedure: A/V Shuntogram/Fistulagram;  Surgeon: Algernon Huxley, MD;  Location: [place] [facility];  Service: Cardiovascular;  Laterality: Left;  . PERIPHERAL VASCULAR CATHETERIZATION N/A 12/31/2015   Procedure: A/V Shunt Intervention;  Surgeon: Algernon Huxley, MD;  Location: [place] [facility];  Service: Cardiovascular;  Laterality: N/A;  . PERIPHERAL VASCULAR CATHETERIZATION Left 02/25/2016   Procedure: A/V Shuntogram/Fistulagram;  Surgeon: Algernon Huxley, MD;  Location: [place] [facility];  Service: Cardiovascular;  Laterality: Left;  . PERIPHERAL VASCULAR CATHETERIZATION N/A 03/18/2016   Procedure: Dialysis/Perma Catheter Removal;  Surgeon: Katha Cabal, MD;  Location: [place] [facility];  Service: Cardiovascular;  Laterality: N/A;  . REMOVAL OF A DIALYSIS CATHETER N/A 06/30/2017   Procedure: REMOVAL OF A DIALYSIS CATHETER;  Surgeon: Katha Cabal, MD;  Location: [facility];  Service: Vascular;  Laterality: N/A;  . REPLACEMENT TOTAL KNEE BILATERAL Bilateral     Prior to Admission medications   Medication Sig Start Date End Date Taking? Authorizing Provider  acetaminophen (TYLENOL) 325 MG tablet Take 2 tablets (650 mg total) by mouth every 4 (four) hours as needed for headache or mild pain. 03/23/18   Loletha Grayer, MD  albuterol (PROVENTIL HFA;VENTOLIN HFA) 108 (90 BASE) MCG/ACT inhaler Inhale 2 puffs into the lungs every 6 (six) hours as needed for wheezing or shortness of breath.     [provider]  amLODipine (NORVASC) 10 MG tablet Take 1 tablet (10 mg total) by mouth daily. 03/23/18   Loletha Grayer, MD  aspirin EC 81 MG tablet Take 81 mg by mouth daily.    [provider]  calcium carbonate (OSCAL) 1500 (600 Ca) MG TABS tablet Take 600 mg of elemental calcium by mouth daily with breakfast.    [provider]  clotrimazole (MYCELEX) 10 MG troche Take 1 tablet (10 mg total) by mouth 5 (five) times daily for 14 days. 04/04/18 04/18/18  Gregor Hams, MD  gabapentin (NEURONTIN) 100 MG capsule Take 100 mg by mouth at bedtime.    [provider]  heparin 100-0.45 UNIT/ML-% infusion Inject 1,200 Units/hr into the vein continuous. 03/23/18   Loletha Grayer, MD  isosorbide mononitrate (IMDUR) 30 MG 24 hr tablet Take 1 tablet (30 mg total) by mouth daily. 03/23/18 03/23/19  Loletha Grayer, MD  methocarbamol (ROBAXIN) 750 MG tablet Take 750 mg by mouth 2 (two) times daily as needed for muscle spasms.     [provider]  metoprolol succinate (TOPROL-XL) 100 MG 24 hr tablet Take 1 tablet (100 mg total) by mouth daily. Take with or immediately following a meal. 03/23/18   Loletha Grayer, MD  multivitamin (RENA-VIT) TABS  tablet Take 1 tablet by mouth at bedtime. 03/23/18   Loletha Grayer, MD  nitroGLYCERIN (NITROLINGUAL) 0.4 MG/SPRAY spray Place 2 sprays under the tongue every 5 (five) minutes x 3 doses as needed for chest pain.     [provider]  Nutritional Supplements (FEEDING SUPPLEMENT, NEPRO CARB STEADY,) LIQD Take 237 mLs by mouth daily. 02/27/18   Hillary Bow, MD  Oxycodone HCl 10 MG TABS Take 10 mg by mouth every 6 (six) hours as needed (pain).  12/28/17   [provider]  pantoprazole (PROTONIX) 40 MG tablet Take 40 mg by mouth 2 (two) times daily.    [provider]  RENVELA 2.4 g PACK Take 2.4 g by mouth 2 (two) times daily. 01/14/18   Fritzi Mandes, MD  rosuvastatin (CRESTOR) 20 MG tablet Take 1 tablet (20 mg total) by mouth daily. 03/03/18   Saundra Shelling, MD  SENSIPAR 30 MG tablet Take 1 tablet (30 mg total) by mouth daily. 01/14/18   Fritzi Mandes, MD  SPIRIVA HANDIHALER 18 MCG inhalation capsule Place 1 capsule into inhaler and inhale daily.    [provider]  SYMBICORT 160-4.5 MCG/ACT inhaler Inhale 2 puffs into the lungs 2 (two) times daily.    [provider]  torsemide (DEMADEX) 100 MG tablet Take 1 tablet (100 mg total) by mouth daily. 03/21/18   Demetrios Loll, MD  venlafaxine XR (EFFEXOR-XR) 37.5 MG 24 hr capsule Take 37.5 mg by mouth daily with breakfast.    [provider]    Allergies Nystatin; Sulfa antibiotics; and Niaspan [niacin]  Family History  Problem Relation Age of Onset  . Cancer Mother   . Cancer Father   . Diabetes Brother   . Heart disease Brother     Social History Social History   Tobacco Use  . Smoking status: Current Every Day Smoker    Packs/day: 0.25    Types: Cigarettes  . Smokeless tobacco: Never Used  Substance Use Topics  . Alcohol use: No    Alcohol/week: 0.0 oz  . Drug use: No    Review of Systems Constitutional: No fever/chills Eyes: No visual changes. ENT: No sore throat.  Positive for white  coating to the tongue Cardiovascular: Denies chest pain. Respiratory: Denies shortness of breath. Gastrointestinal: No abdominal pain.  No nausea, no vomiting.  No diarrhea.  No constipation. Genitourinary: Negative for dysuria. Musculoskeletal: Negative for neck pain.  Negative for back pain. Integumentary: Negative for rash. Neurological: Negative for headaches, focal weakness or numbness.   ____________________________________________   PHYSICAL EXAM:  VITAL SIGNS: ED Triage Vitals  Enc Vitals Group     BP 04/03/18 2240 (!) 132/59     Pulse Rate 04/03/18 2240 75     Resp 04/03/18 2240 17     Temp 04/03/18 2240 97.7 F (36.5 C)     Temp Source 04/03/18 2240 Oral  SpO2 04/03/18 2240 98 %     Weight 04/03/18 2241 101.6 kg (224 lb)     Height --      Head Circumference --      Peak Flow --      Pain Score 04/04/18 0047 10     Pain Loc --      Pain Edu? --      Excl. in [place]? --     Constitutional: Alert and oriented. Well appearing and in no acute distress. Eyes: Conjunctivae are normal. Head: Atraumatic. Mouth/Throat: Mucous membranes are moist.  White coating to the tongue that is easily scraped with a tongue depressor revealing a red base. Neck: No stridor.   Cardiovascular: Normal rate, regular rhythm. Good peripheral circulation. Grossly normal heart sounds. Respiratory: Normal respiratory effort.  No retractions. Lungs CTAB. Gastrointestinal: Soft and nontender. No distention.  Musculoskeletal: No lower extremity tenderness nor edema. No gross deformities of extremities. Neurologic:  Normal speech and language. No gross focal neurologic deficits are appreciated.  Skin:  Skin is warm, dry and intact. No rash noted.     Procedures   ____________________________________________   INITIAL IMPRESSION / ASSESSMENT AND PLAN / ED COURSE  As part of my medical decision making, I reviewed the following data within the electronic MEDICAL RECORD NUMBER   67 year old  female presenting with above-stated history and physical exam consistent with thrush.  Patient allergic to nystatin such clotrimazole Trouche was prescribed.    ____________________________________________  FINAL CLINICAL IMPRESSION(S) / ED DIAGNOSES  Final diagnoses:  Thrush     MEDICATIONS GIVEN DURING THIS VISIT:  Medications - No data to display   ED Discharge Orders        Ordered    clotrimazole (MYCELEX) 10 MG troche  5 times daily     04/04/18 0100       Note:  This document was prepared using Dragon voice recognition software and may include unintentional dictation errors.    Gregor Hams, MD 04/04/18 [phone]

## 2018-04-04 NOTE — ED Notes (Signed)
ED Provider at bedside. 

## 2018-04-15 NOTE — ED Provider Notes (Signed)
[facility] Emergency Department Provider Note   First MD Initiated Contact with Patient 03/20/18 [phone]     (approximate)  I have reviewed the triage vital signs and the nursing notes.   HISTORY  Chief Complaint Mouth Lesions   HPI Kimberly Shaffer is a 67 y.o. female with below list of chronic medical conditions including MI and CABG and dialysis presents to the emergency department with central chest pain is currently 8 out of 10.  Patient states that the pain began at rest and is accompanied by dyspnea however patient denies any diaphoresis nausea vomiting or dizziness.  Past Medical History:  Diagnosis Date  . Asthma   . CAD (coronary artery disease)   . Chronic lower back pain   . COPD (chronic obstructive pulmonary disease) ([place])   . Depression   . Diabetes mellitus without complication (HCC)    NIDD  . H/O angina pectoris   . H/O blood clots   . Hypercholesteremia   . Hypertension   . Left rotator cuff tear   . Myocardial infarction ([place])   . Obesity   . Renal insufficiency   . Stroke ([place])   . Vertigo, aural     Patient Active Problem List   Diagnosis Date Noted  . NSTEMI (non-ST elevated myocardial infarction) ([place]) 03/23/2018  . Acute on chronic diastolic CHF (congestive heart failure) ([place]) 03/02/2018  . GERD (gastroesophageal reflux disease) 03/02/2018  . Respiratory failure ([place]) 01/11/2018  . Abdominal pain 06/28/2017  . Bilateral carotid artery stenosis 03/08/2017  . ESRD on dialysis ([place]) 03/08/2017  . Non-ST elevation myocardial infarction (NSTEMI), subendocardial infarction, subsequent episode of care ([place]) 11/04/2016  . Onychogryphosis 10/31/2016  . Onychomycosis 10/31/2016  . Subungual exostosis 10/31/2016  . Type 2 diabetes mellitus with diabetic neuropathy ([place]) 10/31/2016  . Chronic osteomyelitis of left hand ([place]) 09/03/2016  . Left arm swelling 08/30/2016  . Pre-operative clearance 08/30/2016  . Coronary artery  disease of native artery of native heart with stable angina pectoris ([place]) 08/03/2016  . Benign paroxysmal positional vertigo due to bilateral vestibular disorder 07/26/2016  . H/O: CVA (cerebrovascular accident) 07/26/2016  . Stable angina ([place]) 07/05/2016  . Sepsis due to pneumonia ([place]) 07/05/2016  . Fracture of left foot 04/26/2016  . Pre-transplant evaluation for kidney transplant 02/04/2016  . Chest pain at rest 12/17/2015  . Chest pain 12/17/2015  . DJD of shoulder 10/21/2015  . Perianal lesion 05/20/2015  . 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 Lumbar radiculopathy 05/14/2015  . 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 Benign essential hypertension 04/22/2015  . Grief 03/18/2015  . Lumbosacral facet joint syndrome 02/16/2015  . Greater trochanteric bursitis 02/16/2015  . DDD (degenerative disc disease), lumbosacral 01/20/2015  . 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 Osteoarthritis 01/20/2015  . IDA (iron deficiency anemia) 03/25/2014  . Thyroid nodule 01/15/2014  . Lung nodule 08/28/2013  . Tobacco abuse 05/17/2013  . Mixed hyperlipidemia 06/14/2011  . OSA on CPAP 06/14/2011  . PAD (peripheral artery disease) ([place]) 06/14/2011  . Depression 05/05/2011  . Chronic kidney disease (CKD), stage V ([place]) 03/26/2011  . Multiple vessel coronary artery disease 03/26/2011  . Obesity, unspecified 03/26/2011  . Type 2 diabetes mellitus ([place]) 03/26/2011    Past Surgical History:  Procedure Laterality Date  . A/V FISTULAGRAM Left 03/14/2017   Procedure: A/V Fistulagram;  Surgeon: Katha Cabal, MD;  Location: [employer] [facility];  Service: Cardiovascular;  Laterality:  Left;  . AV FISTULA PLACEMENT Left 11-04-2015  . AV FISTULA PLACEMENT Left 11/04/2015   Procedure: ARTERIOVENOUS (AV) FISTULA CREATION  ( BRACHIAL CEPHALIC );  Surgeon: Katha Cabal, MD;  Location: [facility] ORS;  Service: Vascular;  Laterality: Left;  . CARDIAC  CATHETERIZATION    . CARDIAC CATHETERIZATION Left 07/19/2016   Procedure: Left Heart Cath and Coronary Angiography;  Surgeon: Corey Skains, MD;  Location: [place] CV LAB;  Service: Cardiovascular;  Laterality: Left;  . CORONARY ARTERY BYPASS GRAFT  2012  . CORONARY STENT PLACEMENT    . JOINT REPLACEMENT    . KNEE SURGERY Bilateral   . LEFT HEART CATH AND CORONARY ANGIOGRAPHY N/A 12/25/2017   Procedure: LEFT HEART CATH AND CORONARY ANGIOGRAPHY;  Surgeon: Teodoro Spray, MD;  Location: [place] CV LAB;  Service: Cardiovascular;  Laterality: N/A;  . LEFT HEART CATH AND CORONARY ANGIOGRAPHY Right 03/23/2018   Procedure: LEFT HEART CATH AND CORONARY ANGIOGRAPHY;  Surgeon: Dionisio David, MD;  Location: [place] CV LAB;  Service: Cardiovascular;  Laterality: Right;  . PERIPHERAL VASCULAR CATHETERIZATION N/A 11/13/2015   Procedure: Dialysis/Perma Catheter Insertion;  Surgeon: Katha Cabal, MD;  Location: [place] CV LAB;  Service: Cardiovascular;  Laterality: N/A;  . PERIPHERAL VASCULAR CATHETERIZATION N/A 12/04/2015   Procedure: Dialysis/Perma Catheter Insertion;  Surgeon: Katha Cabal, MD;  Location: [place] CV LAB;  Service: Cardiovascular;  Laterality: N/A;  . PERIPHERAL VASCULAR CATHETERIZATION Left 12/31/2015   Procedure: A/V Shuntogram/Fistulagram;  Surgeon: Algernon Huxley, MD;  Location: [place] CV LAB;  Service: Cardiovascular;  Laterality: Left;  . PERIPHERAL VASCULAR CATHETERIZATION N/A 12/31/2015   Procedure: A/V Shunt Intervention;  Surgeon: Algernon Huxley, MD;  Location: [place] CV LAB;  Service: Cardiovascular;  Laterality: N/A;  . PERIPHERAL VASCULAR CATHETERIZATION Left 02/25/2016   Procedure: A/V Shuntogram/Fistulagram;  Surgeon: Algernon Huxley, MD;  Location: [place] CV LAB;  Service: Cardiovascular;  Laterality: Left;  . PERIPHERAL VASCULAR CATHETERIZATION N/A 03/18/2016   Procedure: Dialysis/Perma Catheter Removal;  Surgeon: Katha Cabal, MD;  Location: [place] CV LAB;  Service: Cardiovascular;  Laterality: N/A;  . REMOVAL OF A DIALYSIS CATHETER N/A 06/30/2017   Procedure: REMOVAL OF A DIALYSIS CATHETER;  Surgeon: Katha Cabal, MD;  Location: [facility] ORS;  Service: Vascular;  Laterality: N/A;  . REPLACEMENT TOTAL KNEE BILATERAL Bilateral     Prior to Admission medications   Medication Sig Start Date End Date Taking? Authorizing Provider  acetaminophen (TYLENOL) 325 MG tablet Take 2 tablets (650 mg total) by mouth every 4 (four) hours as needed for headache or mild pain. 03/23/18   Loletha Grayer, MD  albuterol (PROVENTIL HFA;VENTOLIN HFA) 108 (90 BASE) MCG/ACT inhaler Inhale 2 puffs into the lungs every 6 (six) hours as needed for wheezing or shortness of breath.     [provider]  amLODipine (NORVASC) 10 MG tablet Take 1 tablet (10 mg total) by mouth daily. 03/23/18   Loletha Grayer, MD  aspirin EC 81 MG tablet Take 81 mg by mouth daily.    [provider]  calcium carbonate (OSCAL) 1500 (600 Ca) MG TABS tablet Take 600 mg of elemental calcium by mouth daily with breakfast.    [provider]  clotrimazole (MYCELEX) 10 MG troche Take 1 tablet (10 mg total) by mouth 5 (five) times daily for 14 days. 04/04/18 04/18/18  Gregor Hams, MD  gabapentin (NEURONTIN) 100 MG capsule Take 100 mg by mouth at  bedtime.    [provider]  heparin 100-0.45 UNIT/ML-% infusion Inject 1,200 Units/hr into the vein continuous. 03/23/18   Loletha Grayer, MD  isosorbide mononitrate (IMDUR) 30 MG 24 hr tablet Take 1 tablet (30 mg total) by mouth daily. 03/23/18 03/23/19  Loletha Grayer, MD  methocarbamol (ROBAXIN) 750 MG tablet Take 750 mg by mouth 2 (two) times daily as needed for muscle spasms.     [provider]  metoprolol succinate (TOPROL-XL) 100 MG 24 hr tablet Take 1 tablet (100 mg total) by mouth daily. Take with or immediately following a meal. 03/23/18   Loletha Grayer, MD    multivitamin (RENA-VIT) TABS tablet Take 1 tablet by mouth at bedtime. 03/23/18   Loletha Grayer, MD  nitroGLYCERIN (NITROLINGUAL) 0.4 MG/SPRAY spray Place 2 sprays under the tongue every 5 (five) minutes x 3 doses as needed for chest pain.     [provider]  Nutritional Supplements (FEEDING SUPPLEMENT, NEPRO CARB STEADY,) LIQD Take 237 mLs by mouth daily. 02/27/18   Hillary Bow, MD  Oxycodone HCl 10 MG TABS Take 10 mg by mouth every 6 (six) hours as needed (pain).  12/28/17   [provider]  pantoprazole (PROTONIX) 40 MG tablet Take 40 mg by mouth 2 (two) times daily.    [provider]  RENVELA 2.4 g PACK Take 2.4 g by mouth 2 (two) times daily. 01/14/18   Fritzi Mandes, MD  rosuvastatin (CRESTOR) 20 MG tablet Take 1 tablet (20 mg total) by mouth daily. 03/03/18   Saundra Shelling, MD  SENSIPAR 30 MG tablet Take 1 tablet (30 mg total) by mouth daily. 01/14/18   Fritzi Mandes, MD  SPIRIVA HANDIHALER 18 MCG inhalation capsule Place 1 capsule into inhaler and inhale daily.    [provider]  SYMBICORT 160-4.5 MCG/ACT inhaler Inhale 2 puffs into the lungs 2 (two) times daily.    [provider]  torsemide (DEMADEX) 100 MG tablet Take 1 tablet (100 mg total) by mouth daily. 03/21/18   Demetrios Loll, MD  venlafaxine XR (EFFEXOR-XR) 37.5 MG 24 hr capsule Take 37.5 mg by mouth daily with breakfast.    [provider]    Allergies Nystatin; Sulfa antibiotics; and Niaspan [niacin]  Family History  Problem Relation Age of Onset  . Cancer Mother   . Cancer Father   . Diabetes Brother   . Heart disease Brother     Social History Social History   Tobacco Use  . Smoking status: Current Every Day Smoker    Packs/day: 0.25    Types: Cigarettes  . Smokeless tobacco: Never Used  Substance Use Topics  . Alcohol use: No    Alcohol/week: 0.0 oz  . Drug use: No    Review of Systems Constitutional: No fever/chills Eyes: No visual changes. ENT: No  sore throat. Cardiovascular: Positive for chest pain. Respiratory: Positive for shortness of breath. Gastrointestinal: No abdominal pain.  No nausea, no vomiting.  No diarrhea.  No constipation. Genitourinary: Negative for dysuria. Musculoskeletal: Negative for neck pain.  Negative for back pain. Integumentary: Negative for rash. Neurological: Negative for headaches, focal weakness or numbness.   ____________________________________________   PHYSICAL EXAM:  VITAL SIGNS: ED Triage Vitals  Enc Vitals Group     BP 04/03/18 2240 (!) 132/59     Pulse Rate 04/03/18 2240 75     Resp 04/03/18 2240 17     Temp 04/03/18 2240 97.7 F (36.5 C)     Temp Source 04/03/18 2240 Oral  SpO2 04/03/18 2240 98 %     Weight 04/03/18 2241 101.6 kg (224 lb)     Height --      Head Circumference --      Peak Flow --      Pain Score 04/04/18 0047 10     Pain Loc --      Pain Edu? --      Excl. in [place]? --     Constitutional: Alert and oriented.  Apparent discomfort.   Eyes: Conjunctivae are normal.  Head: Atraumatic. Mouth/Throat: Mucous membranes are moist.  Oropharynx non-erythematous. Neck: No stridor.   Cardiovascular: Normal rate, regular rhythm. Good peripheral circulation. Grossly normal heart sounds. Respiratory: Normal respiratory effort.  No retractions. Lungs CTAB. Gastrointestinal: Soft and nontender. No distention.  Musculoskeletal: No lower extremity tenderness nor edema. No gross deformities of extremities. Neurologic:  Normal speech and language. No gross focal neurologic deficits are appreciated.  Skin:  Skin is warm, dry and intact. No rash noted. Psychiatric: Mood and affect are normal. Speech and behavior are normal. ____________________________________________   LABS (all labs ordered are listed, but only abnormal results are displayed)  Labs Reviewed - No data to display ____________________________________________  EKG  ED ECG REPORT I, [place], the  attending physician, personally viewed and interpreted this ECG.   Date: 04/15/2018  EKG Time: 1:14 AM  Rate: 77  Rhythm: Normal sinus rhythm  Axis: Normal  Intervals: Normal  ST&T Change: None  ____________________________________________  RADIOLOGY I, [place] BROWN, personally viewed and evaluated these images (plain radiographs) as part of my medical decision making, as well as reviewing the written report by the radiologist.  ED MD interpretation: Cardiomegaly with mild pulmonary edema   Procedures   ____________________________________________   INITIAL IMPRESSION / ASSESSMENT AND PLAN / ED COURSE  As part of my medical decision making, I reviewed the following data within the electronic MEDICAL RECORD NUMBER  67 year old female presenting with above-stated history physical exam secondary to chest pain dyspnea.  Patient given IV morphine with pain improvement however patient states that pain is continued.  Chest x-ray revealed cardiomegaly with mild pulmonary edema.  Given ongoing chest pain and pulmonary edema patient discussed with hospitalist for hospital admission further evaluation and management    ____________________________________________  FINAL CLINICAL IMPRESSION(S) / ED DIAGNOSES  Chest pain  MEDICATIONS GIVEN DURING THIS VISIT:  Medications - No data to display   ED Discharge Orders        Ordered    clotrimazole (MYCELEX) 10 MG troche  5 times daily     04/04/18 0100       Note:  This document was prepared using Dragon voice recognition software and may include unintentional dictation errors.    Gregor Hams, MD 04/15/18 [phone]

## 2018-04-17 ENCOUNTER — Inpatient Hospital Stay
Admission: EM | Admit: 2018-04-17 | Discharge: 2018-04-19 | DRG: 640 | Disposition: A | Discharge status: Home / Self Care | Payer: Medicare Other | Attending: Internal Medicine | Admitting: Internal Medicine

## 2018-04-17 DIAGNOSIS — E782 Mixed hyperlipidemia: Secondary | ICD-10-CM | POA: Diagnosis present

## 2018-04-17 DIAGNOSIS — N186 End stage renal disease: Secondary | ICD-10-CM | POA: Diagnosis present

## 2018-04-17 DIAGNOSIS — J810 Acute pulmonary edema: Secondary | ICD-10-CM

## 2018-04-17 DIAGNOSIS — E8770 Fluid overload, unspecified: Principal | ICD-10-CM | POA: Diagnosis present

## 2018-04-17 DIAGNOSIS — Z951 Presence of aortocoronary bypass graft: Secondary | ICD-10-CM

## 2018-04-17 DIAGNOSIS — I214 Non-ST elevation (NSTEMI) myocardial infarction: Secondary | ICD-10-CM | POA: Diagnosis present

## 2018-04-17 DIAGNOSIS — F17210 Nicotine dependence, cigarettes, uncomplicated: Secondary | ICD-10-CM | POA: Diagnosis present

## 2018-04-17 DIAGNOSIS — G4733 Obstructive sleep apnea (adult) (pediatric): Secondary | ICD-10-CM | POA: Diagnosis present

## 2018-04-17 DIAGNOSIS — Z992 Dependence on renal dialysis: Secondary | ICD-10-CM

## 2018-04-17 DIAGNOSIS — G8929 Other chronic pain: Secondary | ICD-10-CM | POA: Diagnosis present

## 2018-04-17 DIAGNOSIS — M545 Low back pain: Secondary | ICD-10-CM | POA: Diagnosis present

## 2018-04-17 DIAGNOSIS — E1142 Type 2 diabetes mellitus with diabetic polyneuropathy: Secondary | ICD-10-CM | POA: Diagnosis present

## 2018-04-17 DIAGNOSIS — Z882 Allergy status to sulfonamides status: Secondary | ICD-10-CM

## 2018-04-17 DIAGNOSIS — Z8249 Family history of ischemic heart disease and other diseases of the circulatory system: Secondary | ICD-10-CM

## 2018-04-17 DIAGNOSIS — F329 Major depressive disorder, single episode, unspecified: Secondary | ICD-10-CM | POA: Diagnosis present

## 2018-04-17 DIAGNOSIS — Z7902 Long term (current) use of antithrombotics/antiplatelets: Secondary | ICD-10-CM

## 2018-04-17 DIAGNOSIS — I5033 Acute on chronic diastolic (congestive) heart failure: Secondary | ICD-10-CM | POA: Diagnosis present

## 2018-04-17 DIAGNOSIS — J449 Chronic obstructive pulmonary disease, unspecified: Secondary | ICD-10-CM | POA: Diagnosis present

## 2018-04-17 DIAGNOSIS — I132 Hypertensive heart and chronic kidney disease with heart failure and with stage 5 chronic kidney disease, or end stage renal disease: Secondary | ICD-10-CM | POA: Diagnosis present

## 2018-04-17 DIAGNOSIS — Z7982 Long term (current) use of aspirin: Secondary | ICD-10-CM

## 2018-04-17 DIAGNOSIS — D631 Anemia in chronic kidney disease: Secondary | ICD-10-CM | POA: Diagnosis present

## 2018-04-17 DIAGNOSIS — E669 Obesity, unspecified: Secondary | ICD-10-CM | POA: Diagnosis present

## 2018-04-17 DIAGNOSIS — K219 Gastro-esophageal reflux disease without esophagitis: Secondary | ICD-10-CM | POA: Diagnosis present

## 2018-04-17 DIAGNOSIS — Z79899 Other long term (current) drug therapy: Secondary | ICD-10-CM

## 2018-04-17 DIAGNOSIS — E875 Hyperkalemia: Secondary | ICD-10-CM | POA: Diagnosis present

## 2018-04-17 DIAGNOSIS — E1122 Type 2 diabetes mellitus with diabetic chronic kidney disease: Secondary | ICD-10-CM | POA: Diagnosis present

## 2018-04-17 DIAGNOSIS — Z955 Presence of coronary angioplasty implant and graft: Secondary | ICD-10-CM

## 2018-04-17 DIAGNOSIS — Z833 Family history of diabetes mellitus: Secondary | ICD-10-CM

## 2018-04-17 DIAGNOSIS — Z8673 Personal history of transient ischemic attack (TIA), and cerebral infarction without residual deficits: Secondary | ICD-10-CM

## 2018-04-17 DIAGNOSIS — I2510 Atherosclerotic heart disease of native coronary artery without angina pectoris: Secondary | ICD-10-CM | POA: Diagnosis present

## 2018-04-17 DIAGNOSIS — Z96653 Presence of artificial knee joint, bilateral: Secondary | ICD-10-CM | POA: Diagnosis present

## 2018-04-17 DIAGNOSIS — Z7951 Long term (current) use of inhaled steroids: Secondary | ICD-10-CM

## 2018-04-17 DIAGNOSIS — N2581 Secondary hyperparathyroidism of renal origin: Secondary | ICD-10-CM | POA: Diagnosis present

## 2018-04-17 DIAGNOSIS — E7800 Pure hypercholesterolemia, unspecified: Secondary | ICD-10-CM | POA: Diagnosis present

## 2018-04-17 DIAGNOSIS — J9621 Acute and chronic respiratory failure with hypoxia: Secondary | ICD-10-CM | POA: Diagnosis present

## 2018-04-17 DIAGNOSIS — J9601 Acute respiratory failure with hypoxia: Secondary | ICD-10-CM | POA: Diagnosis present

## 2018-04-17 DIAGNOSIS — J9600 Acute respiratory failure, unspecified whether with hypoxia or hypercapnia: Secondary | ICD-10-CM

## 2018-04-17 DIAGNOSIS — Z888 Allergy status to other drugs, medicaments and biological substances status: Secondary | ICD-10-CM

## 2018-04-17 DIAGNOSIS — Z66 Do not resuscitate: Secondary | ICD-10-CM | POA: Diagnosis present

## 2018-04-17 DIAGNOSIS — E1151 Type 2 diabetes mellitus with diabetic peripheral angiopathy without gangrene: Secondary | ICD-10-CM | POA: Diagnosis present

## 2018-04-18 ENCOUNTER — Emergency Department: Payer: Medicare Other

## 2018-04-18 ENCOUNTER — Other Ambulatory Visit: Payer: Self-pay

## 2018-04-18 ENCOUNTER — Encounter: Payer: Self-pay | Admitting: *Deleted

## 2018-04-18 DIAGNOSIS — I132 Hypertensive heart and chronic kidney disease with heart failure and with stage 5 chronic kidney disease, or end stage renal disease: Secondary | ICD-10-CM | POA: Diagnosis present

## 2018-04-18 DIAGNOSIS — E1151 Type 2 diabetes mellitus with diabetic peripheral angiopathy without gangrene: Secondary | ICD-10-CM | POA: Diagnosis present

## 2018-04-18 DIAGNOSIS — I214 Non-ST elevation (NSTEMI) myocardial infarction: Secondary | ICD-10-CM | POA: Diagnosis present

## 2018-04-18 DIAGNOSIS — E7800 Pure hypercholesterolemia, unspecified: Secondary | ICD-10-CM | POA: Diagnosis present

## 2018-04-18 DIAGNOSIS — J9621 Acute and chronic respiratory failure with hypoxia: Secondary | ICD-10-CM | POA: Diagnosis present

## 2018-04-18 DIAGNOSIS — E875 Hyperkalemia: Secondary | ICD-10-CM | POA: Diagnosis present

## 2018-04-18 DIAGNOSIS — M545 Low back pain: Secondary | ICD-10-CM | POA: Diagnosis present

## 2018-04-18 DIAGNOSIS — E1122 Type 2 diabetes mellitus with diabetic chronic kidney disease: Secondary | ICD-10-CM | POA: Diagnosis present

## 2018-04-18 DIAGNOSIS — E8770 Fluid overload, unspecified: Secondary | ICD-10-CM | POA: Diagnosis present

## 2018-04-18 DIAGNOSIS — F17210 Nicotine dependence, cigarettes, uncomplicated: Secondary | ICD-10-CM | POA: Diagnosis present

## 2018-04-18 DIAGNOSIS — F329 Major depressive disorder, single episode, unspecified: Secondary | ICD-10-CM | POA: Diagnosis present

## 2018-04-18 DIAGNOSIS — J9601 Acute respiratory failure with hypoxia: Secondary | ICD-10-CM | POA: Diagnosis present

## 2018-04-18 DIAGNOSIS — E669 Obesity, unspecified: Secondary | ICD-10-CM | POA: Diagnosis present

## 2018-04-18 DIAGNOSIS — K219 Gastro-esophageal reflux disease without esophagitis: Secondary | ICD-10-CM | POA: Diagnosis present

## 2018-04-18 DIAGNOSIS — G4733 Obstructive sleep apnea (adult) (pediatric): Secondary | ICD-10-CM | POA: Diagnosis present

## 2018-04-18 DIAGNOSIS — I2510 Atherosclerotic heart disease of native coronary artery without angina pectoris: Secondary | ICD-10-CM | POA: Diagnosis present

## 2018-04-18 DIAGNOSIS — N186 End stage renal disease: Secondary | ICD-10-CM | POA: Diagnosis present

## 2018-04-18 DIAGNOSIS — N2581 Secondary hyperparathyroidism of renal origin: Secondary | ICD-10-CM | POA: Diagnosis present

## 2018-04-18 DIAGNOSIS — E782 Mixed hyperlipidemia: Secondary | ICD-10-CM | POA: Diagnosis present

## 2018-04-18 DIAGNOSIS — J810 Acute pulmonary edema: Secondary | ICD-10-CM | POA: Diagnosis present

## 2018-04-18 DIAGNOSIS — D631 Anemia in chronic kidney disease: Secondary | ICD-10-CM | POA: Diagnosis present

## 2018-04-18 DIAGNOSIS — G8929 Other chronic pain: Secondary | ICD-10-CM | POA: Diagnosis present

## 2018-04-18 DIAGNOSIS — J449 Chronic obstructive pulmonary disease, unspecified: Secondary | ICD-10-CM | POA: Diagnosis present

## 2018-04-18 DIAGNOSIS — E1142 Type 2 diabetes mellitus with diabetic polyneuropathy: Secondary | ICD-10-CM | POA: Diagnosis present

## 2018-04-18 DIAGNOSIS — I5033 Acute on chronic diastolic (congestive) heart failure: Secondary | ICD-10-CM | POA: Diagnosis present

## 2018-04-18 DIAGNOSIS — Z66 Do not resuscitate: Secondary | ICD-10-CM | POA: Diagnosis present

## 2018-04-18 LAB — COMPREHENSIVE METABOLIC PANEL
ALK PHOS: 62 U/L (ref 38–126)
ALT: 11 U/L (ref 0–44)
ANION GAP: 13 (ref 5–15)
AST: 15 U/L (ref 15–41)
Albumin: 3.8 g/dL (ref 3.5–5.0)
BILIRUBIN TOTAL: 0.4 mg/dL (ref 0.3–1.2)
BUN: 86 mg/dL — ABNORMAL HIGH (ref 8–23)
CO2: 22 mmol/L (ref 22–32)
CREATININE: 9.59 mg/dL — AB (ref 0.44–1.00)
Calcium: 8.7 mg/dL — ABNORMAL LOW (ref 8.9–10.3)
Chloride: 102 mmol/L (ref 98–111)
GFR calc Af Amer: 4 mL/min — ABNORMAL LOW (ref >60)
GFR, EST NON AFRICAN AMERICAN: 4 mL/min — AB (ref >60)
Glucose, Bld: 99 mg/dL (ref 70–99)
Potassium: 6.1 mmol/L — ABNORMAL HIGH (ref 3.5–5.1)
Sodium: 137 mmol/L (ref 135–145)
TOTAL PROTEIN: 8.5 g/dL — AB (ref 6.5–8.1)

## 2018-04-18 LAB — CBC WITH DIFFERENTIAL/PLATELET
BASOS PCT: 1 %
Basophils Absolute: 0.1 10*3/uL (ref 0–0.1)
EOS ABS: 0.2 10*3/uL (ref 0–0.7)
EOS PCT: 2 %
HCT: 24.5 % — ABNORMAL LOW (ref 35.0–47.0)
Hemoglobin: 8.1 g/dL — ABNORMAL LOW (ref 12.0–16.0)
Lymphocytes Relative: 15 %
Lymphs Abs: 1.5 10*3/uL (ref 1.0–3.6)
MCH: 30.6 pg (ref 26.0–34.0)
MCHC: 33 g/dL (ref 32.0–36.0)
MCV: 92.5 fL (ref 80.0–100.0)
MONOS PCT: 7 %
Monocytes Absolute: 0.7 10*3/uL (ref 0.2–0.9)
NEUTROS ABS: 7.6 10*3/uL — AB (ref 1.4–6.5)
Neutrophils Relative %: 75 %
PLATELETS: 216 10*3/uL (ref 150–440)
RBC: 2.65 MIL/uL — AB (ref 3.80–5.20)
RDW: 18.8 % — AB (ref 11.5–14.5)
WBC: 10.1 10*3/uL (ref 3.6–11.0)

## 2018-04-18 LAB — BASIC METABOLIC PANEL
Anion gap: 15 (ref 5–15)
BUN: 84 mg/dL — AB (ref 8–23)
CALCIUM: 8.7 mg/dL — AB (ref 8.9–10.3)
CO2: 16 mmol/L — ABNORMAL LOW (ref 22–32)
Chloride: 108 mmol/L (ref 98–111)
Creatinine, Ser: 10.18 mg/dL — ABNORMAL HIGH (ref 0.44–1.00)
GFR calc Af Amer: 4 mL/min — ABNORMAL LOW (ref >60)
GFR, EST NON AFRICAN AMERICAN: 3 mL/min — AB (ref >60)
Glucose, Bld: 105 mg/dL — ABNORMAL HIGH (ref 70–99)
POTASSIUM: 5.2 mmol/L — AB (ref 3.5–5.1)
Sodium: 139 mmol/L (ref 135–145)

## 2018-04-18 LAB — LIPASE, BLOOD: LIPASE: 34 U/L (ref 11–51)

## 2018-04-18 LAB — MAGNESIUM: MAGNESIUM: 1.7 mg/dL (ref 1.7–2.4)

## 2018-04-18 LAB — GLUCOSE, CAPILLARY: Glucose-Capillary: 71 mg/dL (ref 70–99)

## 2018-04-18 LAB — MRSA PCR SCREENING: MRSA by PCR: NEGATIVE

## 2018-04-18 LAB — BRAIN NATRIURETIC PEPTIDE: B Natriuretic Peptide: 1720 pg/mL — ABNORMAL HIGH (ref 0.0–100.0)

## 2018-04-18 LAB — TROPONIN I

## 2018-04-18 LAB — PHOSPHORUS: PHOSPHORUS: 9.3 mg/dL — AB (ref 2.5–4.6)

## 2018-04-18 MED ORDER — DOCUSATE SODIUM 100 MG PO CAPS
100.0000 mg | ORAL_CAPSULE | Freq: Two times a day (BID) | ORAL | Status: DC
  Administered 2018-04-18 – 2018-04-19 (×2): 100 mg via ORAL
  Filled 2018-04-18 (×2): qty 1

## 2018-04-18 MED ORDER — INSULIN ASPART 100 UNIT/ML IV SOLN
10.0000 [IU] | Freq: Once | INTRAVENOUS | Status: AC
  Administered 2018-04-18: 10 [IU] via INTRAVENOUS
  Filled 2018-04-18: qty 0.1

## 2018-04-18 MED ORDER — NITROGLYCERIN 2 % TD OINT
TOPICAL_OINTMENT | TRANSDERMAL | Status: AC
  Filled 2018-04-18: qty 1

## 2018-04-18 MED ORDER — ALBUTEROL SULFATE (2.5 MG/3ML) 0.083% IN NEBU
5.0000 mg | INHALATION_SOLUTION | Freq: Once | RESPIRATORY_TRACT | Status: AC
  Administered 2018-04-18: 5 mg via RESPIRATORY_TRACT
  Filled 2018-04-18: qty 6

## 2018-04-18 MED ORDER — SODIUM CHLORIDE 0.9 % IV SOLN: 100.0000 mL | INTRAVENOUS | Status: DC | PRN

## 2018-04-18 MED ORDER — CINACALCET HCL 30 MG PO TABS
30.0000 mg | ORAL_TABLET | Freq: Every day | ORAL | Status: DC
  Administered 2018-04-18 – 2018-04-19 (×2): 30 mg via ORAL
  Filled 2018-04-18 (×2): qty 1

## 2018-04-18 MED ORDER — DEXTROSE 50 % IV SOLN
1.0000 | Freq: Once | INTRAVENOUS | Status: AC
  Administered 2018-04-18: 50 mL via INTRAVENOUS
  Filled 2018-04-18: qty 50

## 2018-04-18 MED ORDER — CLOPIDOGREL BISULFATE 75 MG PO TABS
75.0000 mg | ORAL_TABLET | Freq: Every day | ORAL | Status: DC
  Administered 2018-04-18 – 2018-04-19 (×2): 75 mg via ORAL
  Filled 2018-04-18 (×2): qty 1

## 2018-04-18 MED ORDER — PENTAFLUOROPROP-TETRAFLUOROETH EX AERO
1.0000 "application " | INHALATION_SPRAY | CUTANEOUS | Status: DC | PRN
  Filled 2018-04-18: qty 30

## 2018-04-18 MED ORDER — AMLODIPINE BESYLATE 10 MG PO TABS
10.0000 mg | ORAL_TABLET | Freq: Every day | ORAL | Status: DC
  Administered 2018-04-18: 10 mg via ORAL
  Filled 2018-04-18: qty 1

## 2018-04-18 MED ORDER — HEPARIN SODIUM (PORCINE) 5000 UNIT/ML IJ SOLN
5000.0000 [IU] | Freq: Three times a day (TID) | INTRAMUSCULAR | Status: DC
  Administered 2018-04-18 – 2018-04-19 (×4): 5000 [IU] via SUBCUTANEOUS
  Filled 2018-04-18 (×4): qty 1

## 2018-04-18 MED ORDER — RANOLAZINE ER 500 MG PO TB12
500.0000 mg | ORAL_TABLET | Freq: Two times a day (BID) | ORAL | Status: DC
  Administered 2018-04-18 – 2018-04-19 (×3): 500 mg via ORAL
  Filled 2018-04-18 (×4): qty 1

## 2018-04-18 MED ORDER — NITROGLYCERIN 2 % TD OINT
1.0000 [in_us] | TOPICAL_OINTMENT | Freq: Once | TRANSDERMAL | Status: AC
  Administered 2018-04-18: 1 [in_us] via TOPICAL

## 2018-04-18 MED ORDER — ASPIRIN EC 81 MG PO TBEC
81.0000 mg | DELAYED_RELEASE_TABLET | Freq: Every day | ORAL | Status: DC
  Administered 2018-04-18 – 2018-04-19 (×2): 81 mg via ORAL
  Filled 2018-04-18 (×2): qty 1

## 2018-04-18 MED ORDER — ONDANSETRON HCL 4 MG PO TABS: 4.0000 mg | ORAL_TABLET | Freq: Four times a day (QID) | ORAL | Status: DC | PRN

## 2018-04-18 MED ORDER — MAGNESIUM SULFATE 2 GM/50ML IV SOLN
2.0000 g | Freq: Once | INTRAVENOUS | Status: AC
  Administered 2018-04-18: 2 g via INTRAVENOUS
  Filled 2018-04-18: qty 50

## 2018-04-18 MED ORDER — ACETAMINOPHEN 650 MG RE SUPP: 650.0000 mg | Freq: Four times a day (QID) | RECTAL | Status: DC | PRN

## 2018-04-18 MED ORDER — ONDANSETRON HCL 4 MG/2ML IJ SOLN: 4.0000 mg | Freq: Four times a day (QID) | INTRAMUSCULAR | Status: DC | PRN

## 2018-04-18 MED ORDER — METOPROLOL SUCCINATE ER 100 MG PO TB24
100.0000 mg | ORAL_TABLET | Freq: Every day | ORAL | Status: DC
  Administered 2018-04-18: 100 mg via ORAL
  Filled 2018-04-18: qty 2

## 2018-04-18 MED ORDER — LIDOCAINE-PRILOCAINE 2.5-2.5 % EX CREA
1.0000 "application " | TOPICAL_CREAM | CUTANEOUS | Status: DC | PRN
  Filled 2018-04-18: qty 5

## 2018-04-18 MED ORDER — RENA-VITE PO TABS
1.0000 | ORAL_TABLET | Freq: Every day | ORAL | Status: DC
  Administered 2018-04-18: 1 via ORAL
  Filled 2018-04-18: qty 1

## 2018-04-18 MED ORDER — SEVELAMER CARBONATE 2.4 G PO PACK
2.4000 g | PACK | Freq: Two times a day (BID) | ORAL | Status: DC
  Administered 2018-04-18 – 2018-04-19 (×3): 2.4 g via ORAL
  Filled 2018-04-18 (×4): qty 1

## 2018-04-18 MED ORDER — ALBUTEROL SULFATE (2.5 MG/3ML) 0.083% IN NEBU: 2.5000 mg | INHALATION_SOLUTION | RESPIRATORY_TRACT | Status: DC | PRN

## 2018-04-18 MED ORDER — ALTEPLASE 2 MG IJ SOLR: 2.0000 mg | Freq: Once | INTRAMUSCULAR | Status: DC | PRN

## 2018-04-18 MED ORDER — CHLORHEXIDINE GLUCONATE CLOTH 2 % EX PADS
6.0000 | MEDICATED_PAD | Freq: Every day | CUTANEOUS | Status: DC
  Administered 2018-04-19: 6 via TOPICAL

## 2018-04-18 MED ORDER — LIDOCAINE HCL (PF) 1 % IJ SOLN
5.0000 mL | INTRAMUSCULAR | Status: DC | PRN
  Filled 2018-04-18: qty 5

## 2018-04-18 MED ORDER — SODIUM CHLORIDE 0.9 % IV SOLN
1.0000 g | Freq: Once | INTRAVENOUS | Status: AC
  Administered 2018-04-18: 1 g via INTRAVENOUS
  Filled 2018-04-18: qty 10

## 2018-04-18 MED ORDER — TIOTROPIUM BROMIDE MONOHYDRATE 18 MCG IN CAPS
1.0000 | ORAL_CAPSULE | Freq: Every day | RESPIRATORY_TRACT | Status: DC
  Administered 2018-04-18 – 2018-04-19 (×2): 18 ug via RESPIRATORY_TRACT
  Filled 2018-04-18: qty 5

## 2018-04-18 MED ORDER — FLUTICASONE FUROATE-VILANTEROL 200-25 MCG/INH IN AEPB
1.0000 | INHALATION_SPRAY | Freq: Every day | RESPIRATORY_TRACT | Status: DC
  Administered 2018-04-18 – 2018-04-19 (×2): 1 via RESPIRATORY_TRACT
  Filled 2018-04-18: qty 28

## 2018-04-18 MED ORDER — HEPARIN SODIUM (PORCINE) 1000 UNIT/ML DIALYSIS: 1000.0000 [IU] | INTRAMUSCULAR | Status: DC | PRN

## 2018-04-18 MED ORDER — ACETAMINOPHEN 325 MG PO TABS
650.0000 mg | ORAL_TABLET | Freq: Four times a day (QID) | ORAL | Status: DC | PRN
  Administered 2018-04-18 (×2): 650 mg via ORAL
  Filled 2018-04-18 (×2): qty 2

## 2018-04-18 NOTE — Progress Notes (Signed)
HD tx start    04/18/18 1023  Vital Signs  Pulse Rate 64  Pulse Rate Source Monitor  Resp 16  BP (!) 144/71  BP Location Right Wrist  BP Method Automatic  Patient Position (if appropriate) Lying  Oxygen Therapy  SpO2 100 %  O2 Device Nasal Cannula  O2 Flow Rate (L/min) 4 L/min  During Hemodialysis Assessment  Blood Flow Rate (mL/min) 400 mL/min  Arterial Pressure (mmHg) -120 mmHg  Venous Pressure (mmHg) 180 mmHg  Transmembrane Pressure (mmHg) 80 mmHg  Ultrafiltration Rate (mL/min) 860 mL/min  Dialysate Flow Rate (mL/min) 800 ml/min  Conductivity: Machine  13.9  HD Safety Checks Performed Yes  Dialysis Fluid Bolus Normal Saline  Bolus Amount (mL) 250 mL  Intra-Hemodialysis Comments Tx initiated

## 2018-04-18 NOTE — Progress Notes (Signed)
HD tx end   04/18/18 1405  Vital Signs  Pulse Rate 69  Pulse Rate Source Monitor  Resp (!) 21  BP (!) 137/125  BP Location Right Wrist  BP Method Automatic  Patient Position (if appropriate) Lying  Oxygen Therapy  SpO2 100 %  O2 Device Nasal Cannula  O2 Flow Rate (L/min) 4 L/min  During Hemodialysis Assessment  Dialysis Fluid Bolus Normal Saline  Bolus Amount (mL) 250 mL  Intra-Hemodialysis Comments Tx completed

## 2018-04-18 NOTE — Progress Notes (Signed)
Pre HD assessment    04/18/18 1010  Vital Signs  Temp 97.6 F (36.4 C)  Temp Source Oral  Pulse Rate 63  Pulse Rate Source Monitor  Resp 19  BP (!) 145/120  BP Location Right Wrist  BP Method Automatic  Patient Position (if appropriate) Lying  Oxygen Therapy  SpO2 100 %  O2 Device Nasal Cannula  O2 Flow Rate (L/min) 4 L/min  Pain Assessment  Pain Scale 0-10  Pain Score 0  Dialysis Weight  Weight 105.6 kg (232 lb 12.9 oz)  Type of Weight Pre-Dialysis  Time-Out for Hemodialysis  What Procedure? HD  Pt Identifiers(min of two) First/Last Name;MRN/Account#  Correct Site? Yes  Correct Side? Yes  Correct Procedure? Yes  Consents Verified? Yes  Rad Studies Available? N/A  Safety Precautions Reviewed? Yes  Engineer, civil (consulting) Number  ([id number])  Station Number 2  UF/Alarm Test Passed  Conductivity: Meter 13.6  Conductivity: Machine  13.9  pH 7.6  Reverse Osmosis main  Normal Saline Lot Number [id number]  Dialyzer Lot Number [id number]  Disposable Set Lot Number [id number]  Machine Temperature 96.8 F (36 C)  Musician and Audible Yes  Blood Lines Intact and Secured Yes  Pre Treatment Patient Checks  Vascular access used during treatment Fistula  Hepatitis B Surface Antigen Results Negative  Date Hepatitis B Surface Antigen Drawn 01/11/18  Hepatitis B Surface Antibody  (>10)  Date Hepatitis B Surface Antibody Drawn 01/11/18  Hemodialysis Consent Verified Yes  Hemodialysis Standing Orders Initiated Yes  ECG (Telemetry) Monitor On Yes  Prime Ordered Normal Saline  Length of  DialysisTreatment -hour(s) 3.5 Hour(s)  Dialyzer Elisio 17H NR  Dialysate 2K, 2.5 Ca  Dialysis Anticoagulant None  Dialysate Flow Ordered 800  Blood Flow Rate Ordered 400 mL/min  Ultrafiltration Goal 2.5 Liters  Pre Treatment Labs Phosphorus (PTH)  Dialysis Blood Pressure Support Ordered Normal Saline  Education / Care Plan  Dialysis Education Provided Yes  Documented Education in Care Plan  Yes

## 2018-04-18 NOTE — Progress Notes (Signed)
Pt to dialysis.

## 2018-04-18 NOTE — ED Notes (Signed)
md in with pt   Pt placed on bipap with rt at bedside.  Pt had cardiac stents placed appro 2 weeks ago at [place].  Pt denies chest pain.   Pt has sob for 3 days.  No n/v/d.  Pt reports productive cough of gray phlegm.  No fever .  Iv started and labs sent.  nosr on monitor.

## 2018-04-18 NOTE — Progress Notes (Signed)
[facility]  Patient Status: Active  [facility] is providing the following services: SN, PT, SW  Notes: Patient discharged from [facility] without resumption of care [facility] orders. We have been trying to get orders from PCP office but have been unsuccessful in doing so prior to admission to [facility]. I have notified Josh Simser, RNCM for [facility] orders at discharge.   If patient discharges after hours, please call [phone].   Florene Glen 04/18/2018, 9:57 AM

## 2018-04-18 NOTE — Progress Notes (Signed)
Pt. back from dialysis

## 2018-04-18 NOTE — Progress Notes (Signed)
Transported pt to ICU 18 on Bipap without incident. Pt remains on Bipap and is tol well at this time. Report given to ICU RT.

## 2018-04-18 NOTE — Progress Notes (Signed)
[place] [facility] [place] NAME: Markala Sitts    MR#:  [id number]  DATE OF BIRTH:  Aug 02, 1951  SUBJECTIVE:  CHIEF COMPLAINT:   Chief Complaint  Patient presents with  . Shortness of Breath  Patient seen and evaluated today today at the dialysis unit On oxygen via nasal cannula Off BiPAP Decreased shortness of breath  REVIEW OF SYSTEMS:    ROS  CONSTITUTIONAL: No documented fever. No fatigue, weakness. No weight gain, no weight loss.  EYES: No blurry or double vision.  ENT: No tinnitus. No postnasal drip. No redness of the oropharynx.  RESPIRATORY: Occasional cough, no wheeze, no hemoptysis.  Decreased dyspnea.  CARDIOVASCULAR: No chest pain. No orthopnea. No palpitations. No syncope.  GASTROINTESTINAL: No nausea, no vomiting or diarrhea. No abdominal pain. No melena or hematochezia.  GENITOURINARY: No dysuria or hematuria.  ENDOCRINE: No polyuria or nocturia. No heat or cold intolerance.  HEMATOLOGY: No anemia. No bruising. No bleeding.  INTEGUMENTARY: No rashes. No lesions.  MUSCULOSKELETAL: No arthritis. No swelling. No gout.  NEUROLOGIC: No numbness, tingling, or ataxia. No seizure-type activity.  PSYCHIATRIC: No anxiety. No insomnia. No ADD.   DRUG ALLERGIES:   Allergies  Allergen Reactions  . Nystatin Hives and Itching  . Sulfa Antibiotics Swelling, Hives and Rash  . Niaspan [Niacin] Other (See Comments) and Hives    VITALS:  Blood pressure (!) 150/70, pulse 70, temperature 98.4 F (36.9 C), temperature source Oral, resp. rate (!) 23, height 5\' 6"  (1.676 m), weight 227 lb 4.7 oz (103.1 kg), SpO2 100 %.  PHYSICAL EXAMINATION:   Physical Exam  GENERAL:  67 y.o.-year-old patient lying in the bed with no acute distress.  EYES: Pupils equal, round, reactive to light and accommodation. No scleral icterus. Extraocular muscles intact.  HEENT: Head atraumatic, normocephalic. Oropharynx and nasopharynx clear.  NECK:  Supple, no  jugular venous distention. No thyroid enlargement, no tenderness.  LUNGS: Creased breath sounds bilaterally, no wheezing, bilateral rales heard. No use of accessory muscles of respiration.  CARDIOVASCULAR: S1, S2 normal. No murmurs, rubs, or gallops.  ABDOMEN: Soft, nontender, nondistended. Bowel sounds present. No organomegaly or mass.  EXTREMITIES: No cyanosis, clubbing or edema b/l.    NEUROLOGIC: Cranial nerves II through XII are intact. No focal Motor or sensory deficits b/l.   PSYCHIATRIC: The patient is alert and oriented x 3.  SKIN: No obvious rash, lesion, or ulcer.   LABORATORY PANEL:   CBC Recent Labs  Lab 04/18/18 0007  WBC 10.1  HGB 8.1*  HCT 24.5*  PLT 216   ------------------------------------------------------------------------------------------------------------------ Chemistries  Recent Labs  Lab 04/18/18 0007 04/18/18 0507  NA 137 139  K 6.1* 5.2*  CL 102 108  CO2 22 16*  GLUCOSE 99 105*  BUN 86* 84*  CREATININE 9.59* 10.18*  CALCIUM 8.7* 8.7*  MG 1.7  --   AST 15  --   ALT 11  --   ALKPHOS 62  --   BILITOT 0.4  --    ------------------------------------------------------------------------------------------------------------------  Cardiac Enzymes Recent Labs  Lab 04/18/18 0007  TROPONINI <0.03   ------------------------------------------------------------------------------------------------------------------  RADIOLOGY:  Dg Chest Portable 1 View  Result Date: 04/18/2018 CLINICAL DATA:  Shortness of breath x3 days, productive cough EXAM: PORTABLE CHEST 1 VIEW COMPARISON:  03/23/2018 FINDINGS: Cardiomegaly with pulmonary vascular congestion and suspected mild interstitial edema. No definite pleural effusions. No pneumothorax. IMPRESSION: Cardiomegaly with suspected mild interstitial edema. No definite pleural effusions. Electronically Signed   By: Bertis Ruddy  [place] Pink M.D.   On: 04/18/2018 00:51     ASSESSMENT AND PLAN:  67 year old female  patient with history of end-stage renal disease on dialysis, coronary artery disease, hypertension, COPD currently in the ICU for respiratory failure  -Acute hypoxic respiratory failure secondary to fluid overload Patient missed dialysis yesterday Today she is seen by nephrology and will get dialysis Weaned off BiPAP On oxygen via nasal cannula  End-stage renal disease on dialysis- Continue Sensipar and Renvela  -Coronary artery disease Stable continue aspirin Plavix and Ranexa  -DVT prophylaxis subcu heparin  -COPD stable Continue home dose inhalers and oxygen via nasal cannula   All the records are reviewed and case discussed with Care Management/Social Worker. Management plans discussed with the patient, family and they are in agreement.  CODE STATUS: DNR  DVT Prophylaxis: SCDs  TOTAL TIME TAKING CARE OF THIS PATIENT: 35 minutes.   POSSIBLE D/C IN 2 to 3 DAYS, DEPENDING ON CLINICAL CONDITION.  Saundra Shelling M.D on 04/18/2018 at 2:54 PM  Between 7am to 6pm - Pager - [phone]  After 6pm go to [email or web address] - password [email or web address] [facility]  Office  [phone]  CC: Primary care physician; Clinic, [facility]  Note: This dictation was prepared with Dragon dictation along with smaller phrase technology. Any transcriptional errors that result from this process are unintentional.

## 2018-04-18 NOTE — Progress Notes (Signed)
Post HD assessment. Pt tolerated tx well without c/o or complication. Net UF 2534, goal met.    04/18/18 1414  Vital Signs  Temp 98.4 F (36.9 C)  Temp Source Oral  Pulse Rate Source Monitor  Resp (!) 21  BP (!) 151/69  BP Location Right Wrist  BP Method Automatic  Patient Position (if appropriate) Lying  Oxygen Therapy  SpO2 100 %  O2 Device Nasal Cannula  O2 Flow Rate (L/min) 4 L/min  Dialysis Weight  Weight 103.1 kg (227 lb 4.7 oz)  Type of Weight Post-Dialysis  Post-Hemodialysis Assessment  Rinseback Volume (mL) 250 mL  KECN 82.9 V  Dialyzer Clearance Lightly streaked  Duration of HD Treatment -hour(s) 3.5 hour(s)  Hemodialysis Intake (mL) 500 mL  UF Total -Machine (mL) 3034 mL  Net UF (mL) 2534 mL  Tolerated HD Treatment Yes  AVG/AVF Arterial Site Held (minutes) 10 minutes  AVG/AVF Venous Site Held (minutes) 10 minutes  Education / Care Plan  Dialysis Education Provided Yes  Documented Education in Care Plan Yes

## 2018-04-18 NOTE — Consult Note (Signed)
Name: Kimberly Shaffer MRN: [id number] DOB: 1950/12/29    ADMISSION DATE:  04/17/2018 CONSULTATION DATE: 04/18/2018  REFERRING MD : Dr. Marcille Blanco  CHIEF COMPLAINT: Shortness of Breath   BRIEF PATIENT DESCRIPTION:  67 yo female admitted with acute on chronic respiratory failure secondary to pulmonary edema requiring Bipap after missing her hemodialysis session   SIGNIFICANT EVENTS/STUDIES:  07/30 Pt admitted to the Stepdown Unit   HISTORY OF PRESENT ILLNESS:   This is a 67 yo female with a PMH of Vertigo, Stroke (no lingering deficits), Obesity, ESRD on HD, MI, HTN, OSA, PVD, Hypercholesteremia, Diabetes Mellitis, Depression, COPD, Chronic Lower Back Pain, CAD, CABG, Recent Cardiac Stents, and Asthma.  She presented to [facility] ER via EMS on 07/30 with severe shortness of breath onset 3 days prior to presentation.   Due to symptoms she missed her scheduled hemodialysis session yesterday 07/30.  En route to the ER she was placed on CPAP due to respiratory failure.  In the ER she was transitioned to [place].  Lab results revealed K+ 6.1, creatinine 9.59, BNP 1,720, and hbg 8.1.  EKG showed sinus rhythm with depressed T-wave and CXR revealed pulmonary edema.  She was subsequently admitted to the stepdown unit by the hospitalist team for further workup and treatment.   PAST MEDICAL HISTORY :   has a past medical history of Asthma, CAD (coronary artery disease), Chronic lower back pain, COPD (chronic obstructive pulmonary disease) ([place]), Depression, Diabetes mellitus without complication ([place]), H/O angina pectoris, H/O blood clots, Hypercholesteremia, Hypertension, Left rotator cuff tear, Myocardial infarction ([place]), Obesity, Renal insufficiency, Stroke ([place]), and Vertigo, aural.  has a past surgical history that includes Cardiac catheterization; Coronary stent placement; Knee surgery (Bilateral); Replacement total knee bilateral (Bilateral); Coronary artery bypass graft (2012); Joint replacement; AV fistula  placement (Left, 11-04-2015); AV fistula placement (Left, 11/04/2015); Cardiac catheterization (N/A, 11/13/2015); Cardiac catheterization (N/A, 12/04/2015); Cardiac catheterization (Left, 12/31/2015); Cardiac catheterization (N/A, 12/31/2015); Cardiac catheterization (Left, 02/25/2016); Cardiac catheterization (N/A, 03/18/2016); Cardiac catheterization (Left, 07/19/2016); A/V Fistulagram (Left, 03/14/2017); Removal of a dialysis catheter (N/A, 06/30/2017); LEFT HEART CATH AND CORONARY ANGIOGRAPHY (N/A, 12/25/2017); and LEFT HEART CATH AND CORONARY ANGIOGRAPHY (Right, 03/23/2018). Prior to Admission medications   Medication Sig Start Date End Date Taking? Authorizing Provider  acetaminophen (TYLENOL) 650 MG CR tablet Take 650 mg by mouth every 8 (eight) hours as needed for pain.   Yes [provider]  amLODipine (NORVASC) 10 MG tablet Take 1 tablet (10 mg total) by mouth daily. 03/23/18  Yes Wieting, Richard, MD  aspirin EC 81 MG tablet Take 81 mg by mouth daily.   Yes [provider]  atorvastatin (LIPITOR) 40 MG tablet Take 40 mg by mouth at bedtime. 03/28/18  Yes [provider]  clopidogrel (PLAVIX) 75 MG tablet Take 75 mg by mouth daily.   Yes [provider]  clotrimazole (MYCELEX) 10 MG troche Take 1 tablet (10 mg total) by mouth 5 (five) times daily for 14 days. 04/04/18 04/18/18 Yes Gregor Hams, MD  isosorbide mononitrate (IMDUR) 30 MG 24 hr tablet Take 1 tablet (30 mg total) by mouth daily. 03/23/18 03/23/19 Yes Wieting, Richard, MD  multivitamin (RENA-VIT) TABS tablet Take 1 tablet by mouth at bedtime. 03/23/18  Yes Wieting, Richard, MD  pantoprazole (PROTONIX) 40 MG tablet Take 40 mg by mouth 2 (two) times daily.   Yes [provider]  ranolazine (RANEXA) 500 MG 12 hr tablet Take 500 mg by mouth 2 (two) times daily. 04/13/18  Yes  [provider]  SENSIPAR 30 MG tablet Take 1 tablet (30 mg total) by mouth daily. 01/14/18  Yes Fritzi Mandes, MD  acetaminophen  (TYLENOL) 325 MG tablet Take 2 tablets (650 mg total) by mouth every 4 (four) hours as needed for headache or mild pain. 03/23/18   Loletha Grayer, MD  albuterol (PROVENTIL HFA;VENTOLIN HFA) 108 (90 BASE) MCG/ACT inhaler Inhale 2 puffs into the lungs every 6 (six) hours as needed for wheezing or shortness of breath.     [provider]  calcium carbonate (OSCAL) 1500 (600 Ca) MG TABS tablet Take 600 mg of elemental calcium by mouth daily with breakfast.    [provider]  gabapentin (NEURONTIN) 100 MG capsule Take 100 mg by mouth at bedtime.    [provider]  heparin 100-0.45 UNIT/ML-% infusion Inject 1,200 Units/hr into the vein continuous. 03/23/18   Loletha Grayer, MD  methocarbamol (ROBAXIN) 750 MG tablet Take 750 mg by mouth 2 (two) times daily as needed for muscle spasms.     [provider]  metoprolol succinate (TOPROL-XL) 100 MG 24 hr tablet Take 1 tablet (100 mg total) by mouth daily. Take with or immediately following a meal. 03/23/18   Loletha Grayer, MD  nitroGLYCERIN (NITROLINGUAL) 0.4 MG/SPRAY spray Place 2 sprays under the tongue every 5 (five) minutes x 3 doses as needed for chest pain.     [provider]  Nutritional Supplements (FEEDING SUPPLEMENT, NEPRO CARB STEADY,) LIQD Take 237 mLs by mouth daily. 02/27/18   Hillary Bow, MD  Oxycodone HCl 10 MG TABS Take 10 mg by mouth every 6 (six) hours as needed (pain).  12/28/17   [provider]  RENVELA 2.4 g PACK Take 2.4 g by mouth 2 (two) times daily. 01/14/18   Fritzi Mandes, MD  rosuvastatin (CRESTOR) 20 MG tablet Take 1 tablet (20 mg total) by mouth daily. 03/03/18   Saundra Shelling, MD  SPIRIVA HANDIHALER 18 MCG inhalation capsule Place 1 capsule into inhaler and inhale daily.    [provider]  SYMBICORT 160-4.5 MCG/ACT inhaler Inhale 2 puffs into the lungs 2 (two) times daily.    [provider]  torsemide (DEMADEX) 100 MG tablet Take 1 tablet (100 mg total)  by mouth daily. 03/21/18   Demetrios Loll, MD  venlafaxine XR (EFFEXOR-XR) 37.5 MG 24 hr capsule Take 37.5 mg by mouth daily with breakfast.    [provider]   Allergies  Allergen Reactions  . Nystatin Hives and Itching  . Sulfa Antibiotics Swelling, Hives and Rash  . Niaspan [Niacin] Other (See Comments) and Hives    FAMILY HISTORY:  family history includes Cancer in her father and mother; Diabetes in her brother; Heart disease in her brother. SOCIAL HISTORY:  reports that she has been smoking cigarettes.  She has been smoking about 0.25 packs per day. She has never used smokeless tobacco. She reports that she does not drink alcohol or use drugs.  REVIEW OF SYSTEMS: Positives in BOLD  Constitutional: Negative for fever, chills, weight loss, malaise/fatigue and diaphoresis.  HENT: Negative for hearing loss, ear pain, nosebleeds, congestion, sore throat, neck pain, tinnitus and ear discharge.   Eyes: Negative for blurred vision, double vision, photophobia, pain, discharge and redness.  Respiratory: cough, hemoptysis, sputum production, shortness of breath, wheezing and stridor.   Cardiovascular: Negative for chest pain, palpitations, orthopnea, claudication, leg swelling and PND.  Gastrointestinal: Negative for heartburn, nausea, vomiting, abdominal pain, diarrhea, constipation, blood in stool and melena.  Genitourinary: Negative for dysuria, urgency, frequency, hematuria and flank pain.  Musculoskeletal: Negative for myalgias, back pain, joint pain and falls.  Skin: Negative for itching and rash.  Neurological: Negative for dizziness, tingling, tremors, sensory change, speech change, focal weakness, seizures, loss of consciousness, weakness and headaches.  Endo/Heme/Allergies: Negative for environmental allergies and polydipsia. Does not bruise/bleed easily.  SUBJECTIVE:  No c/o at this time   VITAL SIGNS: Temp:  [97.7 F (36.5 C)-97.9 F (36.6 C)] 97.7 F (36.5 C) (07/31  0323) Pulse Rate:  [62-71] 67 (07/31 0240) Resp:  [20-28] 20 (07/31 0323) BP: (153-168)/(65-88) 161/76 (07/31 0323) SpO2:  [94 %-100 %] 94 % (07/31 0323) FiO2 (%):  [30 %] 30 % (07/31 0323) Weight:  [101.6 kg (224 lb)-103.2 kg (227 lb 8.2 oz)] 103.2 kg (227 lb 8.2 oz) (07/31 0327)  PHYSICAL EXAMINATION: General: acutely ill appearing female resting in bed, NAD  Neuro: alert and oriented, follows commands  HEENT: supple, no JVD Cardiovascular: nsr, rrr, no R/G Lungs: faint crackles throughout, even, non labored  Abdomen: +BS x4, soft, obese, non tender non distended  Musculoskeletal: normal tone, 1+ bilateral lower extremity edema  Skin: scabbed abrasion present on left shin   Recent Labs  Lab 04/18/18 0007  NA 137  K 6.1*  CL 102  CO2 22  BUN 86*  CREATININE 9.59*  GLUCOSE 99   Recent Labs  Lab 04/18/18 0007  HGB 8.1*  HCT 24.5*  WBC 10.1  PLT 216   Dg Chest Portable 1 View  Result Date: 04/18/2018 CLINICAL DATA:  Shortness of breath x3 days, productive cough EXAM: PORTABLE CHEST 1 VIEW COMPARISON:  03/23/2018 FINDINGS: Cardiomegaly with pulmonary vascular congestion and suspected mild interstitial edema. No definite pleural effusions. No pneumothorax. IMPRESSION: Cardiomegaly with suspected mild interstitial edema. No definite pleural effusions. Electronically Signed   By: Julian Hy M.D.   On: 04/18/2018 00:51    ASSESSMENT / PLAN: Acute on chronic respiratory failure secondary to pulmonary edema  ESRD with hyperkalemia on HD  Anemia of chronic disease Hx: OSA, Diabetes Mellitus, COPD, Asthma, CABG, and HTN   P: Prn Bipap for dyspnea and/or hypoxia Continue bronchodilator therapy  Continuous telemetry monitoring Continue outpatient cardiac medications  Nephrology consulted appreciate input-HD per recommendations  Trend BMP  Replace electrolytes as indicated  VTE px: subq heparin  Trend CBC Monitor for s/sx of bleeding and transfuse for hgb <7  Marda Stalker, [place] Pager [phone] (please enter 7 digits) PCCM Consult Pager [phone] (please enter 7 digits)

## 2018-04-18 NOTE — Progress Notes (Signed)
Called and gave report to Vamo, RN on [place]. Pt. Transported via wheelchair on RA, A&O x4, saline locked, VSS No care concerns at this time.

## 2018-04-18 NOTE — ED Notes (Signed)
Report called to Lewie Loron ccu nurse

## 2018-04-18 NOTE — ED Triage Notes (Addendum)
Pt brought in via ems from home with resp distress.  Pt on cpap on arrival   Pt had dialysis 3 days ago Pt alert.

## 2018-04-18 NOTE — H&P (Signed)
Kimberly Shaffer is an 67 y.o. female.   Chief Complaint: Shortness of breath HPI: The patient with past medical history of ESRD on dialysis, CAD, COPD and diabetes presents to the emergency department complaining of shortness of breath.  The patient states that she felt unwell yesterday and decided not to go to dialysis.  Today in the emergency department she presented in respiratory distress and had interstitial edema on x-ray.  The patient was started on BiPAP which improved her oxygenation and work of breathing.  When she was stabilized the emergency department staff called the hospitalist service for admission.  Past Medical History:  Diagnosis Date  . Asthma   . CAD (coronary artery disease)   . Chronic lower back pain   . COPD (chronic obstructive pulmonary disease) ([place])   . Depression   . Diabetes mellitus without complication (HCC)    NIDD  . H/O angina pectoris   . H/O blood clots   . Hypercholesteremia   . Hypertension   . Left rotator cuff tear   . Myocardial infarction ([place])   . Obesity   . Renal insufficiency   . Stroke ([place])   . Vertigo, aural     Past Surgical History:  Procedure Laterality Date  . A/V FISTULAGRAM Left 03/14/2017   Procedure: A/V Fistulagram;  Surgeon: Katha Cabal, MD;  Location: [facility];  Service: Cardiovascular;  Laterality: Left;  . AV FISTULA PLACEMENT Left 11-04-2015  . AV FISTULA PLACEMENT Left 11/04/2015   Procedure: ARTERIOVENOUS (AV) FISTULA CREATION  ( BRACHIAL CEPHALIC );  Surgeon: Katha Cabal, MD;  Location: [facility];  Service: Vascular;  Laterality: Left;  . CARDIAC CATHETERIZATION    . CARDIAC CATHETERIZATION Left 07/19/2016   Procedure: Left Heart Cath and Coronary Angiography;  Surgeon: Corey Skains, MD;  Location: [facility];  Service: Cardiovascular;  Laterality: Left;  . CORONARY ARTERY BYPASS GRAFT  2012  . CORONARY STENT PLACEMENT    . JOINT REPLACEMENT    . KNEE SURGERY Bilateral   .  LEFT HEART CATH AND CORONARY ANGIOGRAPHY N/A 12/25/2017   Procedure: LEFT HEART CATH AND CORONARY ANGIOGRAPHY;  Surgeon: Teodoro Spray, MD;  Location: [facility];  Service: Cardiovascular;  Laterality: N/A;  . LEFT HEART CATH AND CORONARY ANGIOGRAPHY Right 03/23/2018   Procedure: LEFT HEART CATH AND CORONARY ANGIOGRAPHY;  Surgeon: Dionisio David, MD;  Location: [facility];  Service: Cardiovascular;  Laterality: Right;  . PERIPHERAL VASCULAR CATHETERIZATION N/A 11/13/2015   Procedure: Dialysis/Perma Catheter Insertion;  Surgeon: Katha Cabal, MD;  Location: [facility];  Service: Cardiovascular;  Laterality: N/A;  . PERIPHERAL VASCULAR CATHETERIZATION N/A 12/04/2015   Procedure: Dialysis/Perma Catheter Insertion;  Surgeon: Katha Cabal, MD;  Location: [facility];  Service: Cardiovascular;  Laterality: N/A;  . PERIPHERAL VASCULAR CATHETERIZATION Left 12/31/2015   Procedure: A/V Shuntogram/Fistulagram;  Surgeon: Algernon Huxley, MD;  Location: [facility];  Service: Cardiovascular;  Laterality: Left;  . PERIPHERAL VASCULAR CATHETERIZATION N/A 12/31/2015   Procedure: A/V Shunt Intervention;  Surgeon: Algernon Huxley, MD;  Location: [facility];  Service: Cardiovascular;  Laterality: N/A;  . PERIPHERAL VASCULAR CATHETERIZATION Left 02/25/2016   Procedure: A/V Shuntogram/Fistulagram;  Surgeon: Algernon Huxley, MD;  Location: [facility];  Service: Cardiovascular;  Laterality: Left;  . PERIPHERAL VASCULAR CATHETERIZATION N/A 03/18/2016   Procedure: Dialysis/Perma Catheter Removal;  Surgeon: Katha Cabal, MD;  Location:  [facility];  Service: Cardiovascular;  Laterality: N/A;  . REMOVAL OF A DIALYSIS CATHETER N/A 06/30/2017   Procedure: REMOVAL OF A DIALYSIS CATHETER;  Surgeon: Katha Cabal, MD;  Location: [facility];  Service: Vascular;  Laterality: N/A;  . REPLACEMENT TOTAL KNEE BILATERAL Bilateral     Family History  Problem  Relation Age of Onset  . Cancer Mother   . Cancer Father   . Diabetes Brother   . Heart disease Brother    Social History:  reports that she has been smoking cigarettes.  She has been smoking about 0.25 packs per day. She has never used smokeless tobacco. She reports that she does not drink alcohol or use drugs.  Allergies:  Allergies  Allergen Reactions  . Nystatin Hives and Itching  . Sulfa Antibiotics Swelling, Hives and Rash  . Niaspan [Niacin] Other (See Comments) and Hives    Medications Prior to Admission  Medication Sig Dispense Refill  . acetaminophen (TYLENOL) 650 MG CR tablet Take 650 mg by mouth every 8 (eight) hours as needed for pain.    [place] Kitchen amLODipine (NORVASC) 10 MG tablet Take 1 tablet (10 mg total) by mouth daily.    Marland Kitchen aspirin EC 81 MG tablet Take 81 mg by mouth daily.    Marland Kitchen atorvastatin (LIPITOR) 40 MG tablet Take 40 mg by mouth at bedtime.  3  . clopidogrel (PLAVIX) 75 MG tablet Take 75 mg by mouth daily.    . clotrimazole (MYCELEX) 10 MG troche Take 1 tablet (10 mg total) by mouth 5 (five) times daily for 14 days. 70 tablet 0  . isosorbide mononitrate (IMDUR) 30 MG 24 hr tablet Take 1 tablet (30 mg total) by mouth daily. 30 tablet 11  . multivitamin (RENA-VIT) TABS tablet Take 1 tablet by mouth at bedtime.  0  . pantoprazole (PROTONIX) 40 MG tablet Take 40 mg by mouth 2 (two) times daily.    . ranolazine (RANEXA) 500 MG 12 hr tablet Take 500 mg by mouth 2 (two) times daily.    . SENSIPAR 30 MG tablet Take 1 tablet (30 mg total) by mouth daily. 60 tablet 0  . acetaminophen (TYLENOL) 325 MG tablet Take 2 tablets (650 mg total) by mouth every 4 (four) hours as needed for headache or mild pain.    Marland Kitchen albuterol (PROVENTIL HFA;VENTOLIN HFA) 108 (90 BASE) MCG/ACT inhaler Inhale 2 puffs into the lungs every 6 (six) hours as needed for wheezing or shortness of breath.     . calcium carbonate (OSCAL) 1500 (600 Ca) MG TABS tablet Take 600 mg of elemental calcium by mouth daily  with breakfast.    . gabapentin (NEURONTIN) 100 MG capsule Take 100 mg by mouth at bedtime.    . heparin 100-0.45 UNIT/ML-% infusion Inject 1,200 Units/hr into the vein continuous. 250 mL   . methocarbamol (ROBAXIN) 750 MG tablet Take 750 mg by mouth 2 (two) times daily as needed for muscle spasms.     . metoprolol succinate (TOPROL-XL) 100 MG 24 hr tablet Take 1 tablet (100 mg total) by mouth daily. Take with or immediately following a meal.    . nitroGLYCERIN (NITROLINGUAL) 0.4 MG/SPRAY spray Place 2 sprays under the tongue every 5 (five) minutes x 3 doses as needed for chest pain.     . Nutritional Supplements (FEEDING SUPPLEMENT, NEPRO CARB STEADY,) LIQD Take 237 mLs by mouth daily. 15 Can 0  . Oxycodone HCl 10 MG TABS Take 10 mg by mouth every 6 (  six) hours as needed (pain).     Marland Kitchen RENVELA 2.4 g PACK Take 2.4 g by mouth 2 (two) times daily. 30 each 0  . rosuvastatin (CRESTOR) 20 MG tablet Take 1 tablet (20 mg total) by mouth daily. 30 tablet 1  . SPIRIVA HANDIHALER 18 MCG inhalation capsule Place 1 capsule into inhaler and inhale daily.    . SYMBICORT 160-4.5 MCG/ACT inhaler Inhale 2 puffs into the lungs 2 (two) times daily.    Marland Kitchen torsemide (DEMADEX) 100 MG tablet Take 1 tablet (100 mg total) by mouth daily. 30 tablet 0  . venlafaxine XR (EFFEXOR-XR) 37.5 MG 24 hr capsule Take 37.5 mg by mouth daily with breakfast.      Results for orders placed or performed during the hospital encounter of 04/17/18 (from the past 48 hour(s))  Comprehensive metabolic panel     Status: Abnormal   Collection Time: 04/18/18 12:07 AM  Result Value Ref Range   Sodium 137 135 - 145 mmol/L   Potassium 6.1 (H) 3.5 - 5.1 mmol/L   Chloride 102 98 - 111 mmol/L   CO2 22 22 - 32 mmol/L   Glucose, Bld 99 70 - 99 mg/dL   BUN 86 (H) 8 - 23 mg/dL   Creatinine, Ser 9.59 (H) 0.44 - 1.00 mg/dL   Calcium 8.7 (L) 8.9 - 10.3 mg/dL   Total Protein 8.5 (H) 6.5 - 8.1 g/dL   Albumin 3.8 3.5 - 5.0 g/dL   AST 15 15 - 41 U/L    ALT 11 0 - 44 U/L   Alkaline Phosphatase 62 38 - 126 U/L   Total Bilirubin 0.4 0.3 - 1.2 mg/dL   GFR calc non Af Amer 4 (L) >60 mL/min   GFR calc Af Amer 4 (L) >60 mL/min    Comment: (NOTE) The eGFR has been calculated using the CKD EPI equation. This calculation has not been validated in all clinical situations. eGFR's persistently <60 mL/min signify possible Chronic Kidney Disease.    Anion gap 13 5 - 15    Comment: Performed at [facility], [address] [place], [place] [address]  Lipase, blood     Status: None   Collection Time: 04/18/18 12:07 AM  Result Value Ref Range   Lipase 34 11 - 51 U/L    Comment: Performed at [facility], [address] [place], [place] [address]  Brain natriuretic peptide     Status: Abnormal   Collection Time: 04/18/18 12:07 AM  Result Value Ref Range   B Natriuretic Peptide 1,720.0 (H) 0.0 - 100.0 pg/mL    Comment: Performed at [facility], [address] [place], [place] [address]  Troponin I     Status: None   Collection Time: 04/18/18 12:07 AM  Result Value Ref Range   Troponin I <0.03 <0.03 ng/mL    Comment: Performed at [facility], [address] [place], [place] [address]  CBC with Differential     Status: Abnormal   Collection Time: 04/18/18 12:07 AM  Result Value Ref Range   WBC 10.1 3.6 - 11.0 K/uL   RBC 2.65 (L) 3.80 - 5.20 MIL/uL   Hemoglobin 8.1 (L) 12.0 - 16.0 g/dL   HCT 24.5 (L) 35.0 - 47.0 %   MCV 92.5 80.0 - 100.0 fL   MCH 30.6 26.0 - 34.0 pg   MCHC 33.0 32.0 - 36.0 g/dL   RDW 18.8 (H) 11.5 - 14.5 %   Platelets 216 150 - 440  K/uL   Neutrophils Relative % 75 %   Neutro Abs 7.6 (H) 1.4 - 6.5 K/uL   Lymphocytes Relative 15 %   Lymphs Abs 1.5 1.0 - 3.6 K/uL   Monocytes Relative 7 %   Monocytes Absolute 0.7 0.2 - 0.9 K/uL   Eosinophils Relative 2 %   Eosinophils Absolute 0.2 0 - 0.7 K/uL   Basophils Relative 1 %   Basophils Absolute 0.1 0 - 0.1 K/uL    Comment: Performed at  [facility], [address] [place], [place] [address]  Magnesium     Status: None   Collection Time: 04/18/18 12:07 AM  Result Value Ref Range   Magnesium 1.7 1.7 - 2.4 mg/dL    Comment: Performed at [facility], [address] [place], [place] [address]  Glucose, capillary     Status: None   Collection Time: 04/18/18  3:22 AM  Result Value Ref Range   Glucose-Capillary 71 70 - 99 mg/dL   Comment 1 Notify RN    Comment 2 Document in Chart   MRSA PCR Screening     Status: None   Collection Time: 04/18/18  3:27 AM  Result Value Ref Range   MRSA by PCR NEGATIVE NEGATIVE    Comment:        The GeneXpert MRSA Assay (FDA approved for NASAL specimens only), is one component of a comprehensive MRSA colonization surveillance program. It is not intended to diagnose MRSA infection nor to guide or monitor treatment for MRSA infections. Performed at [facility], [address] [place], [place] [address]   Basic metabolic panel     Status: Abnormal   Collection Time: 04/18/18  5:07 AM  Result Value Ref Range   Sodium 139 135 - 145 mmol/L   Potassium 5.2 (H) 3.5 - 5.1 mmol/L   Chloride 108 98 - 111 mmol/L   CO2 16 (L) 22 - 32 mmol/L   Glucose, Bld 105 (H) 70 - 99 mg/dL   BUN 84 (H) 8 - 23 mg/dL   Creatinine, Ser 10.18 (H) 0.44 - 1.00 mg/dL   Calcium 8.7 (L) 8.9 - 10.3 mg/dL   GFR calc non Af Amer 3 (L) >60 mL/min   GFR calc Af Amer 4 (L) >60 mL/min    Comment: (NOTE) The eGFR has been calculated using the CKD EPI equation. This calculation has not been validated in all clinical situations. eGFR's persistently <60 mL/min signify possible Chronic Kidney Disease.    Anion gap 15 5 - 15    Comment: Performed at [facility], [address] [place], [place] [address]   Dg Chest Portable 1 View  Result Date: 04/18/2018 CLINICAL DATA:  Shortness of breath x3 days, productive cough EXAM: PORTABLE CHEST 1 VIEW COMPARISON:  03/23/2018  FINDINGS: Cardiomegaly with pulmonary vascular congestion and suspected mild interstitial edema. No definite pleural effusions. No pneumothorax. IMPRESSION: Cardiomegaly with suspected mild interstitial edema. No definite pleural effusions. Electronically Signed   By: Julian Hy M.D.   On: 04/18/2018 00:51    Review of Systems  Constitutional: Negative for chills and fever.  HENT: Negative for sore throat and tinnitus.   Eyes: Negative for blurred vision and redness.  Respiratory: Positive for shortness of breath. Negative for cough.   Cardiovascular: Negative for chest pain, palpitations, orthopnea and PND.  Gastrointestinal: Negative for abdominal pain, diarrhea, nausea and vomiting.  Genitourinary: Negative for dysuria, frequency and urgency.  Musculoskeletal: Negative for joint pain and myalgias.  Skin: Negative for rash.       No lesions  Neurological: Negative for speech change, focal weakness and weakness.  Endo/Heme/Allergies: Does not bruise/bleed easily.       No temperature intolerance  Psychiatric/Behavioral: Negative for depression and suicidal ideas.    Blood pressure (!) 144/58, pulse 66, temperature 97.7 F (36.5 C), temperature source Axillary, resp. rate (!) 22, height _0  (1.676 m), weight 103.2 kg (227 lb 8.2 oz), SpO2 98 %. Physical Exam  Vitals reviewed. Constitutional: She is oriented to person, place, and time. She appears well-developed and well-nourished. No distress.  HENT:  Head: Normocephalic and atraumatic.  Mouth/Throat: Oropharynx is clear and moist. No oropharyngeal exudate.  Eyes: Pupils are equal, round, and reactive to light. Conjunctivae and EOM are normal. No scleral icterus.  Neck: Normal range of motion. Neck supple. No JVD present. No tracheal deviation present. No thyromegaly present.  Cardiovascular: Normal rate, regular rhythm and normal heart sounds. Exam reveals no gallop and no friction rub.  No murmur heard. Respiratory: Breath  sounds normal. She is in respiratory distress.  GI: Soft. Bowel sounds are normal. She exhibits no distension. There is no tenderness.  Genitourinary:  Genitourinary Comments: Deferred  Musculoskeletal: Normal range of motion. She exhibits no edema.  Lymphadenopathy:    She has no cervical adenopathy.  Neurological: She is alert and oriented to person, place, and time. No cranial nerve deficit. She exhibits normal muscle tone.  Skin: Skin is warm and dry. No rash noted. No erythema.  Psychiatric: She has a normal mood and affect. Her behavior is normal. Judgment and thought content normal.     Assessment/Plan This is a 67 year old female admitted for acute respiratory failure. 1.  Respiratory failure: Acute; with hypoxia.  Secondary to fluid overload.  Patient has missed dialysis and will need treatment today.  Continue BiPAP for now. 2.  ESRD: On dialysis; continue Sensipar and Renvela 3.  CAD: Stable; continue aspirin, Plavix and Ranexa 4.  Hypertension: Controlled; continue amlodipine and metoprolol. 5.  COPD: Continue ICS and Spiriva.  Albuterol as needed  6.  DVT prophylaxis: Heparin 7.  GI prophylaxis: None The patient is a DNR.  Time spent on admission orders and critical care approximately 45 minutes.  Discussed with ICU staff  Harrie Foreman, MD 04/18/2018, 5:57 AM

## 2018-04-18 NOTE — Progress Notes (Signed)
Pre HD assessment    04/18/18 1011  Neurological  Level of Consciousness Alert  Orientation Level Oriented X4  Respiratory  Respiratory Pattern Regular  Chest Assessment Chest expansion symmetrical  Cardiac  ECG Monitor No  Vascular  R Radial Pulse +2  L Radial Pulse +2  Edema Generalized  Integumentary  Integumentary (WDL) X  Skin Color Appropriate for ethnicity  Musculoskeletal  Musculoskeletal (WDL) X  Generalized Weakness Yes  Assistive Device None  GU Assessment  Genitourinary (WDL) X  Genitourinary Symptoms  (HD)  Psychosocial  Psychosocial (WDL) WDL

## 2018-04-18 NOTE — Progress Notes (Signed)
Post HD assessment    04/18/18 1413  Neurological  Level of Consciousness Alert  Orientation Level Oriented X4  Respiratory  Respiratory Pattern Regular;Tachypnea  Chest Assessment Chest expansion symmetrical  Cough Productive  Cardiac  ECG Monitor Yes  Vascular  R Radial Pulse +2  L Radial Pulse +2  Edema Generalized  Integumentary  Integumentary (WDL) X  Skin Color Appropriate for ethnicity  Musculoskeletal  Musculoskeletal (WDL) X  Generalized Weakness Yes  Assistive Device None  GU Assessment  Genitourinary (WDL) X  Genitourinary Symptoms None  Psychosocial  Psychosocial (WDL) WDL

## 2018-04-18 NOTE — Progress Notes (Signed)
[facility]  ROUNDING NOTE   Subjective:  Patient very well-known to [place]. She presents with significant shortness of breath. She was not feeling very well yesterday and decided not to go to dialysis. Chest x-ray revealed interstitial edema. Currently on BiPAP and still a bit short of breath.   Objective:  Vital signs in last 24 hours:  Temp:  [97.7 F (36.5 C)-97.9 F (36.6 C)] 97.7 F (36.5 C) (07/31 0323) Pulse Rate:  [62-71] 69 (07/31 0600) Resp:  [19-28] 19 (07/31 0600) BP: (93-168)/(58-88) 93/65 (07/31 0600) SpO2:  [93 %-100 %] 94 % (07/31 0600) FiO2 (%):  [30 %] 30 % (07/31 0323) Weight:  [101.6 kg (224 lb)-103.2 kg (227 lb 8.2 oz)] 103.2 kg (227 lb 8.2 oz) (07/31 0327)  Weight change:  Filed Weights   04/18/18 0002 04/18/18 0327  Weight: 101.6 kg (224 lb) 103.2 kg (227 lb 8.2 oz)    Intake/Output: I/O last 3 completed shifts: In: 87 [IV Piggyback:50] Out: -    Intake/Output this shift:  No intake/output data recorded.  Physical Exam: General: Critically ill appearing  Head: Normocephalic, atraumatic. Moist oral mucosal membranes  Eyes: Anicteric  Neck: Supple, trachea midline  Lungs:  Bilateral rales, on bipap  Heart: S1S2 no rubs  Abdomen:  Soft, nontender, bowel sounds present  Extremities: Trace peripheral edema.  Neurologic: Awake, alert, following commands  Skin: No lesions  Access: LUE AVF     Basic Metabolic Panel: Recent Labs  Lab 04/18/18 0007 04/18/18 0507  NA 137 139  K 6.1* 5.2*  CL 102 108  CO2 22 16*  GLUCOSE 99 105*  BUN 86* 84*  CREATININE 9.59* 10.18*  CALCIUM 8.7* 8.7*  MG 1.7  --     Liver Function Tests: Recent Labs  Lab 04/18/18 0007  AST 15  ALT 11  ALKPHOS 62  BILITOT 0.4  PROT 8.5*  ALBUMIN 3.8   Recent Labs  Lab 04/18/18 0007  LIPASE 34   No results for input(s): AMMONIA in the last 168 hours.  CBC: Recent Labs  Lab 04/18/18 0007  WBC 10.1  NEUTROABS 7.6*  HGB 8.1*  HCT 24.5*   MCV 92.5  PLT 216    Cardiac Enzymes: Recent Labs  Lab 04/18/18 0007  TROPONINI <0.03    BNP: Invalid input(s): POCBNP  CBG: Recent Labs  Lab 04/18/18 0322  GLUCAP 33    Microbiology: Results for orders placed or performed during the hospital encounter of 04/17/18  MRSA PCR Screening     Status: None   Collection Time: 04/18/18  3:27 AM  Result Value Ref Range Status   MRSA by PCR NEGATIVE NEGATIVE Final    Comment:        The GeneXpert MRSA Assay (FDA approved for NASAL specimens only), is one component of a comprehensive MRSA colonization surveillance program. It is not intended to diagnose MRSA infection nor to guide or monitor treatment for MRSA infections. Performed at [facility], [address] [place], [place] [address]     Coagulation Studies: No results for input(s): LABPROT, INR in the last 72 hours.  Urinalysis: No results for input(s): COLORURINE, LABSPEC, PHURINE, GLUCOSEU, HGBUR, BILIRUBINUR, KETONESUR, PROTEINUR, UROBILINOGEN, NITRITE, LEUKOCYTESUR in the last 72 hours.  Invalid input(s): APPERANCEUR    Imaging: Dg Chest Portable 1 View  Result Date: 04/18/2018 CLINICAL DATA:  Shortness of breath x3 days, productive cough EXAM: PORTABLE CHEST 1 VIEW COMPARISON:  03/23/2018 FINDINGS: Cardiomegaly with pulmonary vascular congestion and suspected mild  interstitial edema. No definite pleural effusions. No pneumothorax. IMPRESSION: Cardiomegaly with suspected mild interstitial edema. No definite pleural effusions. Electronically Signed   By: Julian Hy M.D.   On: 04/18/2018 00:51     Medications:    . amLODipine  10 mg Oral Daily  . aspirin EC  81 mg Oral Daily  . Chlorhexidine Gluconate Cloth  6 each Topical Q0600  . cinacalcet  30 mg Oral Q breakfast  . clopidogrel  75 mg Oral Daily  . docusate sodium  100 mg Oral BID  . fluticasone furoate-vilanterol  1 puff Inhalation Daily  . heparin  5,000 Units Subcutaneous Q8H   . metoprolol succinate  100 mg Oral Daily  . multivitamin  1 tablet Oral QHS  . ranolazine  500 mg Oral BID  . sevelamer carbonate  2.4 g Oral BID WC  . tiotropium  1 capsule Inhalation Daily   acetaminophen **OR** acetaminophen, albuterol, ondansetron **OR** ondansetron (ZOFRAN) IV  Assessment/ Plan:  67 y.o. African American female with end stage renal disease on hemodialysis, coronary artery disease status post CABG, hypertension, peripheral vascular disease, diabetes mellitus type 2, hyperlipidemia, bilateral total knee replacement, PD catheter removal June 30, 2017, s/p PCI mLAD 03/2018  [facility]TTS/left AVF/ 100.5 kg  1.  End-stage renal disease on hemodialysis   2.  Anemia of chronic kidney disease 3.  Secondary hyperparathyroidism 4.  Acute respiratory failure due to pulmonary edema  Plan: Patient was recently admitted for chest pain and known coronary artery disease.  She underwent ECI of the LAD in July 2019 at [facility].  She presents now with increased shortness of breath and found to have mild interstitial edema.  Troponin currently negative.  We will plan for hemodialysis this a.m. ultrafiltration target 2.5 to 3 kg.  Otherwise maintain the patient on Renvela.  She is currently on BiPAP.  Hopefully this can be weaned later this a.m. after dialysis as well.  Further plan as patient progresses.    LOS: 0 Kimberly Shaffer 7/31/20197:43 AM

## 2018-04-18 NOTE — ED Provider Notes (Signed)
[facility] Emergency Department Provider Note  ____________________________________________   First MD Initiated Contact with Patient 04/18/18 0012     (approximate)  I have reviewed the triage vital signs and the nursing notes.   HISTORY  Chief Complaint Shortness of Breath  Level 5 caveat:  history/ROS limited by acute/critical illness  HPI Kimberly Shaffer is a 67 y.o. female with history of chronic renal failure on dialysis (Saturday, Tuesday, and Thursday ) and recent cardiac stents who presents by EMS for evaluation of acute and severe shortness of breath.  Reportedly her shortness of breath has been gradually getting worse over the last 3 days.  She now needs to sit up and cannot be reclined due to worsening shortness of breath.  She states that she did not go to dialysis yesterday because she did not feel well and was short of breath.  The respiratory distress became severe tonight.  She was placed on CPAP by EMS and was already starting to feel better.  She denies fever/chills, chest pain, nausea, vomiting, and abdominal pain.    Past Medical History:  Diagnosis Date  . Asthma   . CAD (coronary artery disease)   . Chronic lower back pain   . COPD (chronic obstructive pulmonary disease) ([place])   . Depression   . Diabetes mellitus without complication (HCC)    NIDD  . H/O angina pectoris   . H/O blood clots   . Hypercholesteremia   . Hypertension   . Left rotator cuff tear   . Myocardial infarction ([place])   . Obesity   . Renal insufficiency   . Stroke ([place])   . Vertigo, aural     Patient Active Problem List   Diagnosis Date Noted  . NSTEMI (non-ST elevated myocardial infarction) ([place]) 03/23/2018  . Acute on chronic diastolic CHF (congestive heart failure) ([place]) 03/02/2018  . GERD (gastroesophageal reflux disease) 03/02/2018  . Respiratory failure ([place]) 01/11/2018  . Abdominal pain 06/28/2017  . Bilateral carotid artery stenosis 03/08/2017   . ESRD on dialysis ([place]) 03/08/2017  . Non-ST elevation myocardial infarction (NSTEMI), subendocardial infarction, subsequent episode of care ([place]) 11/04/2016  . Onychogryphosis 10/31/2016  . Onychomycosis 10/31/2016  . Subungual exostosis 10/31/2016  . Type 2 diabetes mellitus with diabetic neuropathy ([place]) 10/31/2016  . Chronic osteomyelitis of left hand ([place]) 09/03/2016  . Left arm swelling 08/30/2016  . Pre-operative clearance 08/30/2016  . Coronary artery disease of native artery of native heart with stable angina pectoris ([place]) 08/03/2016  . Benign paroxysmal positional vertigo due to bilateral vestibular disorder 07/26/2016  . H/O: CVA (cerebrovascular accident) 07/26/2016  . Stable angina ([place]) 07/05/2016  . Sepsis due to pneumonia ([place]) 07/05/2016  . Fracture of left foot 04/26/2016  . Pre-transplant evaluation for kidney transplant 02/04/2016  . Chest pain at rest 12/17/2015  . Chest pain 12/17/2015  . DJD of shoulder 10/21/2015  . Perianal lesion 05/20/2015  . 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 Lumbar radiculopathy 05/14/2015  . 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 Benign essential hypertension 04/22/2015  . Grief 03/18/2015  . Lumbosacral facet joint syndrome 02/16/2015  . Greater trochanteric bursitis 02/16/2015  . DDD (degenerative disc disease), lumbosacral 01/20/2015  . 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 Osteoarthritis 01/20/2015  . IDA (iron deficiency anemia) 03/25/2014  . Thyroid nodule 01/15/2014  . Lung nodule 08/28/2013  . Tobacco abuse 05/17/2013  . Mixed hyperlipidemia 06/14/2011  . OSA on CPAP 06/14/2011  .  PAD (peripheral artery disease) ([place]) 06/14/2011  . Depression 05/05/2011  . Chronic kidney disease (CKD), stage V ([place]) 03/26/2011  . Multiple vessel coronary artery disease 03/26/2011  . Obesity, unspecified 03/26/2011  . Type 2 diabetes mellitus ([place]) 03/26/2011    Past Surgical  History:  Procedure Laterality Date  . A/V FISTULAGRAM Left 03/14/2017   Procedure: A/V Fistulagram;  Surgeon: Katha Cabal, MD;  Location: [place] [facility];  Service: Cardiovascular;  Laterality: Left;  . AV FISTULA PLACEMENT Left 11-04-2015  . AV FISTULA PLACEMENT Left 11/04/2015   Procedure: ARTERIOVENOUS (AV) FISTULA CREATION  ( BRACHIAL CEPHALIC );  Surgeon: Katha Cabal, MD;  Location: [facility];  Service: Vascular;  Laterality: Left;  . CARDIAC CATHETERIZATION    . CARDIAC CATHETERIZATION Left 07/19/2016   Procedure: Left Heart Cath and Coronary Angiography;  Surgeon: Corey Skains, MD;  Location: [place] [facility];  Service: Cardiovascular;  Laterality: Left;  . CORONARY ARTERY BYPASS GRAFT  2012  . CORONARY STENT PLACEMENT    . JOINT REPLACEMENT    . KNEE SURGERY Bilateral   . LEFT HEART CATH AND CORONARY ANGIOGRAPHY N/A 12/25/2017   Procedure: LEFT HEART CATH AND CORONARY ANGIOGRAPHY;  Surgeon: Teodoro Spray, MD;  Location: [place] [facility];  Service: Cardiovascular;  Laterality: N/A;  . LEFT HEART CATH AND CORONARY ANGIOGRAPHY Right 03/23/2018   Procedure: LEFT HEART CATH AND CORONARY ANGIOGRAPHY;  Surgeon: Dionisio David, MD;  Location: [place] [facility];  Service: Cardiovascular;  Laterality: Right;  . PERIPHERAL VASCULAR CATHETERIZATION N/A 11/13/2015   Procedure: Dialysis/Perma Catheter Insertion;  Surgeon: Katha Cabal, MD;  Location: [place] [facility];  Service: Cardiovascular;  Laterality: N/A;  . PERIPHERAL VASCULAR CATHETERIZATION N/A 12/04/2015   Procedure: Dialysis/Perma Catheter Insertion;  Surgeon: Katha Cabal, MD;  Location: [place] [facility];  Service: Cardiovascular;  Laterality: N/A;  . PERIPHERAL VASCULAR CATHETERIZATION Left 12/31/2015   Procedure: A/V Shuntogram/Fistulagram;  Surgeon: Algernon Huxley, MD;  Location: [place] [facility];  Service: Cardiovascular;  Laterality: Left;  . PERIPHERAL VASCULAR CATHETERIZATION N/A  12/31/2015   Procedure: A/V Shunt Intervention;  Surgeon: Algernon Huxley, MD;  Location: [place] [facility];  Service: Cardiovascular;  Laterality: N/A;  . PERIPHERAL VASCULAR CATHETERIZATION Left 02/25/2016   Procedure: A/V Shuntogram/Fistulagram;  Surgeon: Algernon Huxley, MD;  Location: [place] [facility];  Service: Cardiovascular;  Laterality: Left;  . PERIPHERAL VASCULAR CATHETERIZATION N/A 03/18/2016   Procedure: Dialysis/Perma Catheter Removal;  Surgeon: Katha Cabal, MD;  Location: [place] [facility];  Service: Cardiovascular;  Laterality: N/A;  . REMOVAL OF A DIALYSIS CATHETER N/A 06/30/2017   Procedure: REMOVAL OF A DIALYSIS CATHETER;  Surgeon: Katha Cabal, MD;  Location: [facility];  Service: Vascular;  Laterality: N/A;  . REPLACEMENT TOTAL KNEE BILATERAL Bilateral     Prior to Admission medications   Medication Sig Start Date End Date Taking? Authorizing Provider  acetaminophen (TYLENOL) 325 MG tablet Take 2 tablets (650 mg total) by mouth every 4 (four) hours as needed for headache or mild pain. 03/23/18   Loletha Grayer, MD  albuterol (PROVENTIL HFA;VENTOLIN HFA) 108 (90 BASE) MCG/ACT inhaler Inhale 2 puffs into the lungs every 6 (six) hours as needed for wheezing or shortness of breath.     [provider]  amLODipine (NORVASC) 10 MG tablet Take 1 tablet (10 mg total) by mouth daily. 03/23/18   Loletha Grayer, MD  aspirin EC 81 MG tablet  Take 81 mg by mouth daily.    [provider]  atorvastatin (LIPITOR) 40 MG tablet Take 40 mg by mouth at bedtime. 03/28/18   [provider]  calcium carbonate (OSCAL) 1500 (600 Ca) MG TABS tablet Take 600 mg of elemental calcium by mouth daily with breakfast.    [provider]  clotrimazole (MYCELEX) 10 MG troche Take 1 tablet (10 mg total) by mouth 5 (five) times daily for 14 days. 04/04/18 04/18/18  Gregor Hams, MD  gabapentin (NEURONTIN) 100 MG capsule Take 100 mg by mouth at bedtime.    [provider]  heparin 100-0.45 UNIT/ML-% infusion Inject 1,200 Units/hr into the vein continuous. 03/23/18   Loletha Grayer, MD  isosorbide mononitrate (IMDUR) 30 MG 24 hr tablet Take 1 tablet (30 mg total) by mouth daily. 03/23/18 03/23/19  Loletha Grayer, MD  methocarbamol (ROBAXIN) 750 MG tablet Take 750 mg by mouth 2 (two) times daily as needed for muscle spasms.     [provider]  metoprolol succinate (TOPROL-XL) 100 MG 24 hr tablet Take 1 tablet (100 mg total) by mouth daily. Take with or immediately following a meal. 03/23/18   Loletha Grayer, MD  multivitamin (RENA-VIT) TABS tablet Take 1 tablet by mouth at bedtime. 03/23/18   Loletha Grayer, MD  nitroGLYCERIN (NITROLINGUAL) 0.4 MG/SPRAY spray Place 2 sprays under the tongue every 5 (five) minutes x 3 doses as needed for chest pain.     [provider]  Nutritional Supplements (FEEDING SUPPLEMENT, NEPRO CARB STEADY,) LIQD Take 237 mLs by mouth daily. 02/27/18   Hillary Bow, MD  nystatin (MYCOSTATIN) 100000 UNIT/ML suspension Use as directed 5 mLs in the mouth or throat every 6 (six) hours. Swish and swallow. Use until 48 hours after symptoms resolve. 04/07/18   [provider]  Oxycodone HCl 10 MG TABS Take 10 mg by mouth every 6 (six) hours as needed (pain).  12/28/17   [provider]  pantoprazole (PROTONIX) 40 MG tablet Take 40 mg by mouth 2 (two) times daily.    [provider]  ranolazine (RANEXA) 500 MG 12 hr tablet Take 500 mg by mouth 2 (two) times daily. 04/13/18   [provider]  RENVELA 2.4 g PACK Take 2.4 g by mouth 2 (two) times daily. 01/14/18   Fritzi Mandes, MD  rosuvastatin (CRESTOR) 20 MG tablet Take 1 tablet (20 mg total) by mouth daily. 03/03/18   Saundra Shelling, MD  SENSIPAR 30 MG tablet Take 1 tablet (30 mg total) by mouth daily. 01/14/18   Fritzi Mandes, MD  SPIRIVA HANDIHALER 18 MCG inhalation capsule Place 1 capsule into inhaler and inhale daily.    [provider]  SYMBICORT 160-4.5 MCG/ACT inhaler Inhale 2 puffs into the lungs 2 (two) times daily.    [provider]  torsemide (DEMADEX) 100 MG tablet Take 1 tablet (100 mg total) by mouth daily. 03/21/18   Demetrios Loll, MD  venlafaxine XR (EFFEXOR-XR) 37.5 MG 24 hr capsule Take 37.5 mg by mouth daily with breakfast.    [provider]    Allergies Nystatin; Sulfa antibiotics; and Niaspan [niacin]  Family History  Problem Relation Age of Onset  . Cancer Mother   . Cancer Father   . Diabetes Brother   . Heart disease Brother     Social History Social History   Tobacco Use  . Smoking status: Current Every Day Smoker    Packs/day: 0.25    Types: Cigarettes  .  Smokeless tobacco: Never Used  Substance Use Topics  . Alcohol use: No    Alcohol/week: 0.0 oz  . Drug use: No    Review of Systems Level 5 caveat:  history/ROS limited by acute/critical illness, see HPI for details ____________________________________________   PHYSICAL EXAM:  VITAL SIGNS: ED Triage Vitals  Enc Vitals Group     BP 04/18/18 0003 (!) 168/88     Pulse Rate 04/18/18 0003 70     Resp 04/18/18 0004 (!) 28     Temp 04/18/18 0004 97.9 F (36.6 C)     Temp Source 04/18/18 0004 Oral     SpO2 04/18/18 0003 95 %     Weight 04/18/18 0002 101.6 kg (224 lb)     Height 04/18/18 0002 1.676 m (5\' 6" )     Head Circumference --      Peak Flow --      Pain Score 04/18/18 0001 0     Pain Loc --      Pain Edu? --      Excl. in Waves? --     Constitutional: Alert and oriented.  Moderate respiratory distress on BiPAP Eyes: Conjunctivae are normal.  Head: Atraumatic. Nose: No congestion/rhinnorhea. Mouth/Throat: Mucous membranes are moist. Neck: No stridor.  No meningeal signs.   Cardiovascular: Normal rate, regular rhythm. Good peripheral circulation. Grossly normal heart sounds. Respiratory: Increased respiratory effort with intercostal muscle retractions and accessory muscle usage.  Coarse  breath sounds throughout but without any expiratory wheezing.  Patient is now able to speak in short sentences while on BiPAP, reportedly she was not able to speak much at all earlier. Gastrointestinal: Soft and nontender. No distention.  Musculoskeletal: No lower extremity tenderness nor edema. No gross deformities of extremities. Neurologic:  Normal speech and language. No gross focal neurologic deficits are appreciated.  Skin:  Skin is warm, dry and intact. No rash noted.   ____________________________________________   LABS (all labs ordered are listed, but only abnormal results are displayed)  Labs Reviewed  COMPREHENSIVE METABOLIC PANEL - Abnormal; Notable for the following components:      Result Value   Potassium 6.1 (*)    BUN 86 (*)    Creatinine, Ser 9.59 (*)    Calcium 8.7 (*)    Total Protein 8.5 (*)    GFR calc non Af Amer 4 (*)    GFR calc Af Amer 4 (*)    All other components within normal limits  BRAIN NATRIURETIC PEPTIDE - Abnormal; Notable for the following components:   B Natriuretic Peptide 1,720.0 (*)    All other components within normal limits  CBC WITH DIFFERENTIAL/PLATELET - Abnormal; Notable for the following components:   RBC 2.65 (*)    Hemoglobin 8.1 (*)    HCT 24.5 (*)    RDW 18.8 (*)    Neutro Abs 7.6 (*)    All other components within normal limits  LIPASE, BLOOD  TROPONIN I  MAGNESIUM   ____________________________________________  EKG  ED ECG REPORT I, Hinda Kehr, the attending physician, personally viewed and interpreted this ECG.  Date: 04/18/2018 EKG Time: 00: 12 Rate: 68 Rhythm: normal sinus rhythm QRS Axis: normal Intervals: normal ST/T Wave abnormalities: T wave inversion in leads V5 and V6 with some ST depression in those leads..  These changes are similar to an EKG from about 4 weeks ago but the changes are more pronounced Narrative Interpretation: Likely demand ischemia in the setting of respiratory failure but does not  meet  STEMI criteria   ____________________________________________  RADIOLOGY I, Hinda Kehr, personally viewed and evaluated these images (plain radiographs) as part of my medical decision making, as well as reviewing the written report by the radiologist.  ED MD interpretation:  pulmonary edema  Official radiology report(s): Dg Chest Portable 1 View  Result Date: 04/18/2018 CLINICAL DATA:  Shortness of breath x3 days, productive cough EXAM: PORTABLE CHEST 1 VIEW COMPARISON:  03/23/2018 FINDINGS: Cardiomegaly with pulmonary vascular congestion and suspected mild interstitial edema. No definite pleural effusions. No pneumothorax. IMPRESSION: Cardiomegaly with suspected mild interstitial edema. No definite pleural effusions. Electronically Signed   By: Julian Hy M.D.   On: 04/18/2018 00:51    ____________________________________________   PROCEDURES  Critical Care performed: Yes, see critical care procedure note(s)   Procedure(s) performed:   .Critical Care Performed by: Hinda Kehr, MD Authorized by: Hinda Kehr, MD   Critical care provider statement:    Critical care time (minutes):  30   Critical care time was exclusive of:  Separately billable procedures and treating other patients   Critical care was necessary to treat or prevent imminent or life-threatening deterioration of the following conditions:  Respiratory failure   Critical care was time spent personally by me on the following activities:  Development of treatment plan with patient or surrogate, discussions with consultants, evaluation of patient's response to treatment, examination of patient, obtaining history from patient or surrogate, ordering and performing treatments and interventions, ordering and review of laboratory studies, ordering and review of radiographic studies, pulse oximetry, re-evaluation of patient's condition and review of old  charts     ____________________________________________   INITIAL IMPRESSION / [place] / ED COURSE  As part of my medical decision making, I reviewed the following data within the [place] notes reviewed and incorporated, Labs reviewed , EKG interpreted , Old EKG reviewed, Old chart reviewed, Radiograph reviewed , Discussed with admitting physician  and Notes from prior ED visits    Differential diagnosis includes, but is not limited to, volume overload secondary to heart failure and chronic renal failure and missed dialysis, COPD exacerbation, pneumonia, pneumothorax, ACS, PE.  By far I suspect that volume overload and CHF are the most likely diagnoses.  I anticipate some pulmonary edema on her chest x-ray.  She is already feeling much better on BiPAP.  I will place an inch of nitroglycerin paste on her chest for decreased preload and will continue her on BiPAP.  Awaiting labs.  She will need admission for urgent but not emergent dialysis in the morning.  Clinical Course as of Apr 19 103  Wed Apr 18, 2018  0033 WBC: 10.1 [CF]  0040 Patient is dialysis patient and needs dialysis, I will give albuterol to help with the breathing as well as for the hyperkalemia but the definitive treatment will be dialysis rather than aggressive medical treatment  Potassium(!): 6.1 [CF]  0055 Interstitial edema, no evidence of pneumonia  DG Chest Portable 1 View [CF]  0056 Troponin I: <0.03 [CF]  0056 Magnesium: 1.7 [CF]  6073 Paging hospitalist for admission   [CF]  0103 Administering calcium gluconate 1 g IV to stabilize myocardium.  BNP is 1720.  I discussed the case with Dr. Jannifer Franklin who will admit the patient and he will handle the nephrology consultation.   [CF]    Clinical Course User Index [CF] Hinda Kehr, MD    ____________________________________________  FINAL CLINICAL IMPRESSION(S) / ED DIAGNOSES  Final diagnoses:  Acute respiratory  failure,  unspecified whether with hypoxia or hypercapnia (HCC)  Acute pulmonary edema (HCC)  ESRD on hemodialysis (HCC)  Hyperkalemia, diminished renal excretion     MEDICATIONS GIVEN DURING THIS VISIT:  Medications  calcium gluconate 1 g in sodium chloride 0.9 % 100 mL IVPB (has no administration in time range)  albuterol (PROVENTIL) (2.5 MG/3ML) 0.083% nebulizer solution 5 mg (5 mg Nebulization Given 04/18/18 0100)  nitroGLYCERIN (NITROGLYN) 2 % ointment 1 inch (1 inch Topical Given 04/18/18 0103)     ED Discharge Orders    None       Note:  This document was prepared using Dragon voice recognition software and may include unintentional dictation errors.    Hinda Kehr, MD 04/18/18 [phone]

## 2018-04-19 LAB — PARATHYROID HORMONE, INTACT (NO CA): PTH: 28 pg/mL (ref 15–65)

## 2018-04-19 LAB — PHOSPHORUS: PHOSPHORUS: 6.7 mg/dL — AB (ref 2.5–4.6)

## 2018-04-19 MED ORDER — FLUTICASONE FUROATE-VILANTEROL 200-25 MCG/INH IN AEPB: 1.0000 | INHALATION_SPRAY | Freq: Every day | RESPIRATORY_TRACT | 0 refills | Status: AC

## 2018-04-19 NOTE — Progress Notes (Signed)
Post HD assessment    04/19/18 1317  Neurological  Level of Consciousness Alert  Orientation Level Oriented X4  Respiratory  Respiratory Pattern Regular (c/o sob)  Chest Assessment Chest expansion symmetrical  Cough Productive  Cardiac  ECG Monitor Yes  Vascular  R Radial Pulse +2  L Radial Pulse +2  Edema Generalized  Integumentary  Integumentary (WDL) X  Skin Color Appropriate for ethnicity  Musculoskeletal  Musculoskeletal (WDL) X  Generalized Weakness Yes  Assistive Device None  GU Assessment  Genitourinary (WDL) X  Genitourinary Symptoms  (HD)  Psychosocial  Psychosocial (WDL) WDL

## 2018-04-19 NOTE — Progress Notes (Signed)
HD tx end   04/19/18 1305  Vital Signs  Pulse Rate 73  Pulse Rate Source Monitor  Resp (!) 22  BP 112/66  BP Location Right Wrist  BP Method Automatic  Patient Position (if appropriate) Lying  Oxygen Therapy  SpO2 100 %  O2 Device Nasal Cannula  O2 Flow Rate (L/min) 1 L/min  During Hemodialysis Assessment  Dialysis Fluid Bolus Normal Saline  Bolus Amount (mL) 250 mL  Intra-Hemodialysis Comments Tx completed

## 2018-04-19 NOTE — Progress Notes (Signed)
Pre HD assessment    04/19/18 0919  Vital Signs  Temp 98.6 F (37 C)  Temp Source Oral  Pulse Rate 78  Pulse Rate Source Monitor  Resp 17  BP 104/82  BP Location Right Arm  BP Method Automatic  Patient Position (if appropriate) Lying  Oxygen Therapy  SpO2 90 %  O2 Device Room Air  Pain Assessment  Pain Scale 0-10  Pain Score 0  Dialysis Weight  Weight 105.5 kg (232 lb 9.4 oz)  Type of Weight Pre-Dialysis  Time-Out for Hemodialysis  What Procedure? HD  Pt Identifiers(min of two) First/Last Name;MRN/Account#  Correct Site? Yes  Correct Side? Yes  Correct Procedure? Yes  Consents Verified? Yes  Rad Studies Available? N/A  Safety Precautions Reviewed? Yes  Engineer, civil (consulting) Number  ([id number])  Station Number 4  UF/Alarm Test Passed  Conductivity: Meter 13.6  Conductivity: Machine  13.7  pH 7.4  Reverse Osmosis main  Normal Saline Lot Number [id number]  Dialyzer Lot Number [id number]  Disposable Set Lot Number [id number]  Machine Temperature 96.8 F (36 C)  Musician and Audible Yes  Blood Lines Intact and Secured Yes  Pre Treatment Patient Checks  Vascular access used during treatment Fistula  Hepatitis B Surface Antigen Results Negative  Date Hepatitis B Surface Antigen Drawn 01/11/18  Hepatitis B Surface Antibody  (>10)  Date Hepatitis B Surface Antibody Drawn 01/11/18  Hemodialysis Consent Verified Yes  Hemodialysis Standing Orders Initiated Yes  ECG (Telemetry) Monitor On Yes  Prime Ordered Normal Saline  Length of  DialysisTreatment -hour(s) 3.5 Hour(s)  Dialyzer Elisio 17H NR  Dialysate 2K, 2.5 Ca  Dialysis Anticoagulant None  Dialysate Flow Ordered 800  Blood Flow Rate Ordered 400 mL/min  Ultrafiltration Goal 1 Liters  Pre Treatment Labs Phosphorus  Dialysis Blood Pressure Support Ordered Normal Saline  Education / Care Plan  Dialysis Education Provided Yes  Documented Education in Care Plan Yes  Fistula / Graft Left Upper arm Arteriovenous  fistula  Placement Date/Time: (c) 04/17/18 (c) 0000   Placed prior to admission: Yes  Orientation: Left  Access Location: Upper arm  Access Type: Arteriovenous fistula  Site Condition No complications  Fistula / Graft Assessment Present;Thrill;Bruit  Drainage Description None

## 2018-04-19 NOTE — Progress Notes (Signed)
HD tx start    04/19/18 7939  Vital Signs  Pulse Rate 69  Pulse Rate Source Monitor  Resp 17  BP 132/74  BP Location Right Wrist (cuff readjusted)  BP Method Automatic  Patient Position (if appropriate) Lying  Oxygen Therapy  SpO2 98 %  O2 Device Room Air  During Hemodialysis Assessment  Blood Flow Rate (mL/min) 400 mL/min  Arterial Pressure (mmHg) -140 mmHg  Venous Pressure (mmHg) 200 mmHg  Transmembrane Pressure (mmHg) 70 mmHg  Ultrafiltration Rate (mL/min) 430 mL/min  Dialysate Flow Rate (mL/min) 800 ml/min  Conductivity: Machine  13.7  HD Safety Checks Performed Yes  Dialysis Fluid Bolus Normal Saline  Bolus Amount (mL) 250 mL  Intra-Hemodialysis Comments Tx initiated  Fistula / Graft Left Upper arm Arteriovenous fistula  Placement Date/Time: (c) 04/17/18 (c) 0000   Placed prior to admission: Yes  Orientation: Left  Access Location: Upper arm  Access Type: Arteriovenous fistula  Status Accessed  Needle Size 15     04/19/18 0925  Vital Signs  Pulse Rate 69  Pulse Rate Source Monitor  Resp 17  BP 132/74  BP Location Right Wrist (cuff readjusted)  BP Method Automatic  Patient Position (if appropriate) Lying  Oxygen Therapy  SpO2 98 %  O2 Device Room Air  During Hemodialysis Assessment  Blood Flow Rate (mL/min) 400 mL/min  Arterial Pressure (mmHg) -140 mmHg  Venous Pressure (mmHg) 200 mmHg  Transmembrane Pressure (mmHg) 70 mmHg  Ultrafiltration Rate (mL/min) 430 mL/min  Dialysate Flow Rate (mL/min) 800 ml/min  Conductivity: Machine  13.7  HD Safety Checks Performed Yes  Dialysis Fluid Bolus Normal Saline  Bolus Amount (mL) 250 mL  Intra-Hemodialysis Comments Tx initiated  Fistula / Graft Left Upper arm Arteriovenous fistula  Placement Date/Time: (c) 04/17/18 (c) 0000   Placed prior to admission: Yes  Orientation: Left  Access Location: Upper arm  Access Type: Arteriovenous fistula  Status Accessed  Needle Size 15

## 2018-04-19 NOTE — Progress Notes (Signed)
Pre HD assessment    04/19/18 0920  Neurological  Level of Consciousness Alert  Orientation Level Oriented X4  Respiratory  Respiratory Pattern Regular;Unlabored  Chest Assessment Chest expansion symmetrical  Cough Productive  Cardiac  ECG Monitor Yes  Vascular  R Radial Pulse +2  L Radial Pulse +2  Edema Generalized;Right lower extremity;Left lower extremity  Integumentary  Integumentary (WDL) X  Skin Color Appropriate for ethnicity  Musculoskeletal  Musculoskeletal (WDL) X  Generalized Weakness Yes  Assistive Device None  GU Assessment  Genitourinary (WDL) X  Genitourinary Symptoms  (HD)  Psychosocial  Psychosocial (WDL) WDL

## 2018-04-19 NOTE — Progress Notes (Signed)
[facility]  ROUNDING NOTE   Subjective:  Patient completed hemodialysis today. Tolerated well. Shortness of breath significantly improved.   Objective:  Vital signs in last 24 hours:  Temp:  [98 F (36.7 C)-99.7 F (37.6 C)] 98.5 F (36.9 C) (08/01 1400) Pulse Rate:  [56-112] 74 (08/01 1400) Resp:  [12-27] 20 (08/01 1400) BP: (79-155)/(54-98) 109/60 (08/01 1400) SpO2:  [90 %-100 %] 100 % (08/01 1400) Weight:  [98.8 kg (217 lb 13 oz)-105.5 kg (232 lb 9.4 oz)] 105 kg (231 lb 7.7 oz) (08/01 1318)  Weight change: 3.994 kg (8 lb 12.9 oz) Filed Weights   04/19/18 0600 04/19/18 0919 04/19/18 1318  Weight: 98.8 kg (217 lb 13 oz) 105.5 kg (232 lb 9.4 oz) 105 kg (231 lb 7.7 oz)    Intake/Output: I/O last 3 completed shifts: In: 40 [P.O.:940; IV Piggyback:50] Out: 2534 [Other:2534]   Intake/Output this shift:  Total I/O In: -  Out: 1026 [Other:1026]  Physical Exam: General: No acute distress  Head: Normocephalic, atraumatic. Moist oral mucosal membranes  Eyes: Anicteric  Neck: Supple, trachea midline  Lungs:  CTAB, noral effort  Heart: S1S2 no rubs  Abdomen:  Soft, nontender, bowel sounds present  Extremities: Trace peripheral edema.  Neurologic: Awake, alert, following commands  Skin: No lesions  Access: LUE AVF     Basic Metabolic Panel: Recent Labs  Lab 04/18/18 0007 04/18/18 0507 04/18/18 0922 04/19/18 1008  NA 137 139  --   --   K 6.1* 5.2*  --   --   CL 102 108  --   --   CO2 22 16*  --   --   GLUCOSE 99 105*  --   --   BUN 86* 84*  --   --   CREATININE 9.59* 10.18*  --   --   CALCIUM 8.7* 8.7*  --   --   MG 1.7  --   --   --   PHOS  --   --  9.3* 6.7*    Liver Function Tests: Recent Labs  Lab 04/18/18 0007  AST 15  ALT 11  ALKPHOS 62  BILITOT 0.4  PROT 8.5*  ALBUMIN 3.8   Recent Labs  Lab 04/18/18 0007  LIPASE 34   No results for input(s): AMMONIA in the last 168 hours.  CBC: Recent Labs  Lab 04/18/18 0007  WBC  10.1  NEUTROABS 7.6*  HGB 8.1*  HCT 24.5*  MCV 92.5  PLT 216    Cardiac Enzymes: Recent Labs  Lab 04/18/18 0007  TROPONINI <0.03    BNP: Invalid input(s): POCBNP  CBG: Recent Labs  Lab 04/18/18 0322  GLUCAP 62    Microbiology: Results for orders placed or performed during the hospital encounter of 04/17/18  MRSA PCR Screening     Status: None   Collection Time: 04/18/18  3:27 AM  Result Value Ref Range Status   MRSA by PCR NEGATIVE NEGATIVE Final    Comment:        The GeneXpert MRSA Assay (FDA approved for NASAL specimens only), is one component of a comprehensive MRSA colonization surveillance program. It is not intended to diagnose MRSA infection nor to guide or monitor treatment for MRSA infections. Performed at [facility], [address] [place], [place] [address]     Coagulation Studies: No results for input(s): LABPROT, INR in the last 72 hours.  Urinalysis: No results for input(s): COLORURINE, LABSPEC, PHURINE, GLUCOSEU, HGBUR, BILIRUBINUR, KETONESUR, PROTEINUR, UROBILINOGEN, NITRITE,  LEUKOCYTESUR in the last 72 hours.  Invalid input(s): APPERANCEUR    Imaging: Dg Chest Portable 1 View  Result Date: 04/18/2018 CLINICAL DATA:  Shortness of breath x3 days, productive cough EXAM: PORTABLE CHEST 1 VIEW COMPARISON:  03/23/2018 FINDINGS: Cardiomegaly with pulmonary vascular congestion and suspected mild interstitial edema. No definite pleural effusions. No pneumothorax. IMPRESSION: Cardiomegaly with suspected mild interstitial edema. No definite pleural effusions. Electronically Signed   By: Julian Hy M.D.   On: 04/18/2018 00:51     Medications:    . amLODipine  10 mg Oral Daily  . aspirin EC  81 mg Oral Daily  . Chlorhexidine Gluconate Cloth  6 each Topical Q0600  . cinacalcet  30 mg Oral Q breakfast  . clopidogrel  75 mg Oral Daily  . docusate sodium  100 mg Oral BID  . fluticasone furoate-vilanterol  1 puff Inhalation  Daily  . heparin  5,000 Units Subcutaneous Q8H  . metoprolol succinate  100 mg Oral Daily  . multivitamin  1 tablet Oral QHS  . ranolazine  500 mg Oral BID  . sevelamer carbonate  2.4 g Oral BID WC  . tiotropium  1 capsule Inhalation Daily   acetaminophen **OR** acetaminophen, albuterol, ondansetron **OR** ondansetron (ZOFRAN) IV  Assessment/ Plan:  67 y.o. African American female with end stage renal disease on hemodialysis, coronary artery disease status post CABG, hypertension, peripheral vascular disease, diabetes mellitus type 2, hyperlipidemia, bilateral total knee replacement, PD catheter removal June 30, 2017, s/p PCI mLAD 03/2018  [facility]TTS/left AVF/ 100.5 kg  1.  End-stage renal disease on hemodialysis   2.  Anemia of chronic kidney disease 3.  Secondary hyperparathyroidism 4.  Acute respiratory failure due to pulmonary edema, improved with HD.   Plan: Patient significantly improved today.  She underwent hemodialysis both yesterday and today.  Next dialysis to be scheduled for Saturday if the patient is still here.ultrafiltration performed today was 2.5 L.  Resume Epogen as an outpatient.  Patient will also be maintained on Renvela 2.4 g by mouth twice a day for now. Otherwise disposition as per hospitalist.    LOS: 1 Kimberly Shaffer 8/1/20192:33 PM

## 2018-04-19 NOTE — Discharge Summary (Addendum)
[place] [facility] [place] NAME: Kimberly Shaffer    MR#:  [id number]  DATE OF BIRTH:  08-31-1951  DATE OF ADMISSION:  04/17/2018 ADMITTING PHYSICIAN: Harrie Foreman, MD  DATE OF DISCHARGE: 04/19/2018  PRIMARY CARE PHYSICIAN: [facility]   ADMISSION DIAGNOSIS:  Acute pulmonary edema ([place]) [J81.0] ESRD on hemodialysis ([place]) [N18.6, Z99.2] Hyperkalemia, diminished renal excretion [E87.5] Acute respiratory failure, unspecified whether with hypoxia or hypercapnia ([place]) [J96.00]  DISCHARGE DIAGNOSIS:  Active Problems:   Acute respiratory failure with hypoxemia (HCC) Acute pulmonary edema Hyperkalemia End-stage renal disease on dialysis  SECONDARY DIAGNOSIS:   Past Medical History:  Diagnosis Date  . Asthma   . CAD (coronary artery disease)   . Chronic lower back pain   . COPD (chronic obstructive pulmonary disease) ([place])   . Depression   . Diabetes mellitus without complication (HCC)    NIDD  . H/O angina pectoris   . H/O blood clots   . Hypercholesteremia   . Hypertension   . Left rotator cuff tear   . Myocardial infarction ([place])   . Obesity   . Renal insufficiency   . Stroke ([place])   . Vertigo, aural      ADMITTING HISTORY Shortness of breath HPI: The patient with past medical history of ESRD on dialysis, CAD, COPD and diabetes presents to the emergency department complaining of shortness of breath.  The patient states that she felt unwell yesterday and decided not to go to dialysis.  Today in the emergency department she presented in respiratory distress and had interstitial edema on x-ray.  The patient was started on BiPAP which improved her oxygenation and work of breathing.  When she was stabilized the emergency department staff called the hospitalist service for admission.  HOSPITAL COURSE:  Patient was admitted initially to stepdown for hypoxic respiratory failure.  Patient was put on BiPAP and BiPAP was weaned.  Had  dialysis 2 sessions in the hospital and excess fluid was removed.  Hyperkalemia also resolved.  Patient was weaned off oxygen she was transferred to medical floor.  Tolerated diet well.  Patient will be discharged home follow-up with nephrology in the clinic and primary care physician in the clinic.  CONSULTS OBTAINED:  Treatment Team:  Anthonette Legato, MD  DRUG ALLERGIES:   Allergies  Allergen Reactions  . Nystatin Hives and Itching  . Sulfa Antibiotics Swelling, Hives and Rash  . Niaspan [Niacin] Other (See Comments) and Hives    DISCHARGE MEDICATIONS:   Allergies as of 04/19/2018      Reactions   Nystatin Hives, Itching   Sulfa Antibiotics Swelling, Hives, Rash   Niaspan [niacin] Other (See Comments), Hives      Medication List    STOP taking these medications   clotrimazole 10 MG troche Commonly known as:  MYCELEX   SYMBICORT 160-4.5 MCG/ACT inhaler Generic drug:  budesonide-formoterol Replaced by:  fluticasone furoate-vilanterol 200-25 MCG/INH Aepb     TAKE these medications   acetaminophen 650 MG CR tablet Commonly known as:  TYLENOL Take 650 mg by mouth every 8 (eight) hours as needed for pain.   acetaminophen 325 MG tablet Commonly known as:  TYLENOL Take 2 tablets (650 mg total) by mouth every 4 (four) hours as needed for headache or mild pain.   albuterol 108 (90 Base) MCG/ACT inhaler Commonly known as:  PROVENTIL HFA;VENTOLIN HFA Inhale 2 puffs into the lungs every 6 (six) hours as needed for wheezing or shortness of  breath.   amLODipine 10 MG tablet Commonly known as:  NORVASC Take 1 tablet (10 mg total) by mouth daily.   aspirin EC 81 MG tablet Take 81 mg by mouth daily.   atorvastatin 40 MG tablet Commonly known as:  LIPITOR Take 40 mg by mouth at bedtime.   calcium carbonate 1500 (600 Ca) MG Tabs tablet Commonly known as:  OSCAL Take 600 mg of elemental calcium by mouth daily with breakfast.   clopidogrel 75 MG tablet Commonly known as:   PLAVIX Take 75 mg by mouth daily.   feeding supplement (NEPRO CARB STEADY) Liqd Take 237 mLs by mouth daily.   fluticasone furoate-vilanterol 200-25 MCG/INH Aepb Commonly known as:  BREO ELLIPTA Inhale 1 puff into the lungs daily. Start taking on:  04/20/2018 Replaces:  SYMBICORT 160-4.5 MCG/ACT inhaler   gabapentin 100 MG capsule Commonly known as:  NEURONTIN Take 100 mg by mouth at bedtime.   heparin 100-0.45 UNIT/ML-% infusion Inject 1,200 Units/hr into the vein continuous.   isosorbide mononitrate 30 MG 24 hr tablet Commonly known as:  IMDUR Take 1 tablet (30 mg total) by mouth daily.   methocarbamol 750 MG tablet Commonly known as:  ROBAXIN Take 750 mg by mouth 2 (two) times daily as needed for muscle spasms.   metoprolol succinate 100 MG 24 hr tablet Commonly known as:  TOPROL-XL Take 1 tablet (100 mg total) by mouth daily. Take with or immediately following a meal.   multivitamin Tabs tablet Take 1 tablet by mouth at bedtime.   nitroGLYCERIN 0.4 MG/SPRAY spray Commonly known as:  NITROLINGUAL Place 2 sprays under the tongue every 5 (five) minutes x 3 doses as needed for chest pain.   Oxycodone HCl 10 MG Tabs Take 10 mg by mouth every 6 (six) hours as needed (pain).   pantoprazole 40 MG tablet Commonly known as:  PROTONIX Take 40 mg by mouth 2 (two) times daily.   ranolazine 500 MG 12 hr tablet Commonly known as:  RANEXA Take 500 mg by mouth 2 (two) times daily.   RENVELA 2.4 g Pack Generic drug:  sevelamer carbonate Take 2.4 g by mouth 2 (two) times daily.   rosuvastatin 20 MG tablet Commonly known as:  CRESTOR Take 1 tablet (20 mg total) by mouth daily.   SENSIPAR 30 MG tablet Generic drug:  cinacalcet Take 1 tablet (30 mg total) by mouth daily.   SPIRIVA HANDIHALER 18 MCG inhalation capsule Generic drug:  tiotropium Place 1 capsule into inhaler and inhale daily.   torsemide 100 MG tablet Commonly known as:  DEMADEX Take 1 tablet (100 mg total)  by mouth daily.   venlafaxine XR 37.5 MG 24 hr capsule Commonly known as:  EFFEXOR-XR Take 37.5 mg by mouth daily with breakfast.       Today  Patient seen and evaluated today No shortness of breath Completed dialysis session successfully No dizziness no chest pain  VITAL SIGNS:  Blood pressure 109/60, pulse 74, temperature 98.5 F (36.9 C), temperature source Oral, resp. rate 20, height 5\' 6"  (1.676 m), weight 231 lb 7.7 oz (105 kg), SpO2 100 %.  I/O:    Intake/Output Summary (Last 24 hours) at 04/19/2018 1448 Last data filed at 04/19/2018 1318 Gross per 24 hour  Intake 940 ml  Output 1026 ml  Net -86 ml    PHYSICAL EXAMINATION:  Physical Exam  GENERAL:  67 y.o.-year-old patient lying in the bed with no acute distress.  LUNGS: Normal breath sounds bilaterally, no wheezing,  rales,rhonchi or crepitation. No use of accessory muscles of respiration.  CARDIOVASCULAR: S1, S2 normal. No murmurs, rubs, or gallops.  ABDOMEN: Soft, non-tender, non-distended. Bowel sounds present. No organomegaly or mass.  NEUROLOGIC: Moves all 4 extremities. PSYCHIATRIC: The patient is alert and oriented x 3.  SKIN: No obvious rash, lesion, or ulcer.   DATA REVIEW:   CBC Recent Labs  Lab 04/18/18 0007  WBC 10.1  HGB 8.1*  HCT 24.5*  PLT 216    Chemistries  Recent Labs  Lab 04/18/18 0007 04/18/18 0507  NA 137 139  K 6.1* 5.2*  CL 102 108  CO2 22 16*  GLUCOSE 99 105*  BUN 86* 84*  CREATININE 9.59* 10.18*  CALCIUM 8.7* 8.7*  MG 1.7  --   AST 15  --   ALT 11  --   ALKPHOS 62  --   BILITOT 0.4  --     Cardiac Enzymes Recent Labs  Lab 04/18/18 0007  TROPONINI <0.03    Microbiology Results  Results for orders placed or performed during the hospital encounter of 04/17/18  MRSA PCR Screening     Status: None   Collection Time: 04/18/18  3:27 AM  Result Value Ref Range Status   MRSA by PCR NEGATIVE NEGATIVE Final    Comment:        The GeneXpert MRSA Assay  (FDA approved for NASAL specimens only), is one component of a comprehensive MRSA colonization surveillance program. It is not intended to diagnose MRSA infection nor to guide or monitor treatment for MRSA infections. Performed at [facility], [address] [place], [place] [address]     RADIOLOGY:  Dg Chest Portable 1 View  Result Date: 04/18/2018 CLINICAL DATA:  Shortness of breath x3 days, productive cough EXAM: PORTABLE CHEST 1 VIEW COMPARISON:  03/23/2018 FINDINGS: Cardiomegaly with pulmonary vascular congestion and suspected mild interstitial edema. No definite pleural effusions. No pneumothorax. IMPRESSION: Cardiomegaly with suspected mild interstitial edema. No definite pleural effusions. Electronically Signed   By: Julian Hy M.D.   On: 04/18/2018 00:51    Follow up with PCP in 1 week.  Management plans discussed with the patient, family and they are in agreement.  CODE STATUS: Full code    Code Status Orders  (From admission, onward)        Start     Ordered   04/18/18 0251  Do not attempt resuscitation (DNR)  Continuous    Question Answer Comment  In the event of cardiac or respiratory ARREST Do not call a "code blue"   In the event of cardiac or respiratory ARREST Do not perform Intubation, CPR, defibrillation or ACLS   In the event of cardiac or respiratory ARREST Use medication by any route, position, wound care, and other measures to relive pain and suffering. May use oxygen, suction and manual treatment of airway obstruction as needed for comfort.   Comments nurse may pronounce      04/18/18 0250    Code Status History    Date Active Date Inactive Code Status Order ID Comments User Context   03/23/2018 0955 03/23/2018 2048 DNR [id number]  Loletha Grayer, MD Inpatient   03/23/2018 0500 03/23/2018 0955 Full Code [id number]  Arta Silence, MD Inpatient   03/20/2018 0605 03/21/2018 1313 Full Code [id number]  Harrie Foreman, MD Inpatient    03/02/2018 0313 03/03/2018 1952 Full Code [id number]  Lance Coon, MD ED   02/26/2018 0716 02/27/2018 1821 Full Code [id number]  Arta Silence, MD  Inpatient   01/11/2018 2030 01/14/2018 1509 Full Code [id number]  Saundra Shelling, MD Inpatient   12/25/2017 0835 12/25/2017 1743 Full Code [id number]  Teodoro Spray, MD Inpatient   12/23/2017 0927 12/25/2017 0834 Full Code [id number]  Harrie Foreman, MD Inpatient   12/08/2017 0357 12/08/2017 2344 Full Code [id number]  Harrie Foreman, MD Inpatient   06/28/2017 0749 07/01/2017 2218 Full Code [id number]  Harrie Foreman, MD Inpatient   07/19/2016 0857 07/19/2016 1319 Full Code [id number]  Corey Skains, MD Inpatient   07/05/2016 2236 07/09/2016 1950 Full Code [id number]  Harvie Bridge, DO Inpatient   12/17/2015 7505 12/20/2015 1957 Full Code [id number]  Saundra Shelling, MD Inpatient      TOTAL TIME TAKING CARE OF THIS PATIENT ON DAY OF DISCHARGE: more than 34 minutes.   Saundra Shelling M.D on 04/19/2018 at 2:48 PM  Between 7am to 6pm - Pager - [phone]  After 6pm go to [email or web address] - password [email or web address] [facility]  Office  [phone]  CC: Primary care physician; [facility]  Note: This dictation was prepared with Dragon dictation along with smaller phrase technology. Any transcriptional errors that result from this process are unintentional.

## 2018-04-19 NOTE — Progress Notes (Signed)
Post HD assessment. Pt tolerated tx well without c/o or complication. Net UF 1026, goal met. Pt c/o sob during tx, pt was put on 1 L [place], pt would like to discuss having 02 at home, MD aware.    04/19/18 1318  Vital Signs  Temp 98 F (36.7 C)  Temp Source Oral  Pulse Rate 77  Pulse Rate Source Monitor  Resp 19  BP 106/70  BP Location Right Wrist  BP Method Automatic  Patient Position (if appropriate) Lying  Oxygen Therapy  SpO2 100 %  O2 Device Nasal Cannula  O2 Flow Rate (L/min) 1 L/min  Dialysis Weight  Weight 105 kg (231 lb 7.7 oz)  Type of Weight Post-Dialysis  Post-Hemodialysis Assessment  Rinseback Volume (mL) 250 mL  KECN 80.2 V  Dialyzer Clearance Lightly streaked  Duration of HD Treatment -hour(s) 3.5 hour(s)  Hemodialysis Intake (mL) 500 mL  UF Total -Machine (mL) 1526 mL  Net UF (mL) 1026 mL  Tolerated HD Treatment Yes  AVG/AVF Arterial Site Held (minutes) 10 minutes  AVG/AVF Venous Site Held (minutes) 10 minutes  Education / Care Plan  Dialysis Education Provided Yes  Documented Education in Care Plan Yes  Fistula / Graft Left Upper arm Arteriovenous fistula  Placement Date/Time: (c) 04/17/18 (c) 0000   Placed prior to admission: Yes  Orientation: Left  Access Location: Upper arm  Access Type: Arteriovenous fistula  Site Condition No complications  Fistula / Graft Assessment Present;Thrill;Bruit  Status Deaccessed  Drainage Description None

## 2018-04-20 ENCOUNTER — Ambulatory Visit (INDEPENDENT_AMBULATORY_CARE_PROVIDER_SITE_OTHER): Payer: Medicare Other

## 2018-04-20 ENCOUNTER — Ambulatory Visit (INDEPENDENT_AMBULATORY_CARE_PROVIDER_SITE_OTHER): Payer: Medicare Other | Admitting: Podiatry

## 2018-04-20 ENCOUNTER — Encounter: Payer: Self-pay | Admitting: Podiatry

## 2018-04-20 DIAGNOSIS — B351 Tinea unguium: Secondary | ICD-10-CM

## 2018-04-20 DIAGNOSIS — E0843 Diabetes mellitus due to underlying condition with diabetic autonomic (poly)neuropathy: Secondary | ICD-10-CM

## 2018-04-20 DIAGNOSIS — M779 Enthesopathy, unspecified: Secondary | ICD-10-CM

## 2018-04-20 DIAGNOSIS — M79676 Pain in unspecified toe(s): Secondary | ICD-10-CM

## 2018-04-20 DIAGNOSIS — M778 Other enthesopathies, not elsewhere classified: Secondary | ICD-10-CM

## 2018-04-24 NOTE — Progress Notes (Signed)
   SUBJECTIVE Patient with a history of diabetes mellitus presents to office today complaining of elongated, thickened nails that cause pain while ambulating in shoes. She is unable to trim her own nails. Patient is here for further evaluation and treatment.   Past Medical History:  Diagnosis Date  . Asthma   . CAD (coronary artery disease)   . Chronic lower back pain   . COPD (chronic obstructive pulmonary disease) ([place])   . Depression   . Diabetes mellitus without complication (HCC)    NIDD  . H/O angina pectoris   . H/O blood clots   . Hypercholesteremia   . Hypertension   . Left rotator cuff tear   . Myocardial infarction ([place])   . Obesity   . Renal insufficiency   . Stroke ([place])   . Vertigo, aural     OBJECTIVE General Patient is awake, alert, and oriented x 3 and in no acute distress. Derm Skin is dry and supple bilateral. Negative open lesions or macerations. Remaining integument unremarkable. Nails are tender, long, thickened and dystrophic with subungual debris, consistent with onychomycosis, 1-5 bilateral. No signs of infection noted. Vasc  DP and PT pedal pulses palpable bilaterally. Temperature gradient within normal limits.  Neuro Epicritic and protective threshold sensation diminished bilaterally.  Musculoskeletal Exam No symptomatic pedal deformities noted bilateral. Muscular strength within normal limits.  ASSESSMENT 1. Diabetes Mellitus w/ peripheral neuropathy 2. Onychomycosis of nail due to dermatophyte bilateral 3. Pain in foot bilateral  PLAN OF CARE 1. Patient evaluated today. 2. Instructed to maintain good pedal hygiene and foot care. Stressed importance of controlling blood sugar.  3. Mechanical debridement of nails 1-5 bilaterally performed using a nail nipper. Filed with dremel without incident.  4. Return to clinic in 3 mos.     Edrick Kins, DPM [facility]  Dr. Edrick Kins, [address]                                         [place], [place] [address]                Office [phone]  Fax [phone]

## 2018-04-25 ENCOUNTER — Telehealth: Payer: Self-pay | Admitting: Licensed Clinical Social Worker

## 2018-04-25 NOTE — Telephone Encounter (Signed)
CSW attempted to call patient to follow up with an EMMI call. There was no answer. CSW will try to call patient again once time permits. Shela Leff MSW,LCSW [phone]

## 2018-05-29 ENCOUNTER — Inpatient Hospital Stay
Admission: EM | Admit: 2018-05-29 | Discharge: 2018-05-30 | DRG: 280 | Disposition: A | Discharge status: Other Acute Inpatient Hospital | Payer: Medicare Other | Attending: Internal Medicine | Admitting: Internal Medicine

## 2018-05-29 ENCOUNTER — Other Ambulatory Visit: Payer: Self-pay

## 2018-05-29 ENCOUNTER — Encounter: Payer: Self-pay | Admitting: *Deleted

## 2018-05-29 ENCOUNTER — Emergency Department: Payer: Medicare Other

## 2018-05-29 DIAGNOSIS — M545 Low back pain: Secondary | ICD-10-CM | POA: Diagnosis present

## 2018-05-29 DIAGNOSIS — Z9115 Patient's noncompliance with renal dialysis: Secondary | ICD-10-CM

## 2018-05-29 DIAGNOSIS — G4730 Sleep apnea, unspecified: Secondary | ICD-10-CM | POA: Diagnosis present

## 2018-05-29 DIAGNOSIS — G8929 Other chronic pain: Secondary | ICD-10-CM | POA: Diagnosis present

## 2018-05-29 DIAGNOSIS — I132 Hypertensive heart and chronic kidney disease with heart failure and with stage 5 chronic kidney disease, or end stage renal disease: Secondary | ICD-10-CM | POA: Diagnosis present

## 2018-05-29 DIAGNOSIS — G8250 Quadriplegia, unspecified: Secondary | ICD-10-CM | POA: Diagnosis present

## 2018-05-29 DIAGNOSIS — Z7951 Long term (current) use of inhaled steroids: Secondary | ICD-10-CM

## 2018-05-29 DIAGNOSIS — Z792 Long term (current) use of antibiotics: Secondary | ICD-10-CM

## 2018-05-29 DIAGNOSIS — I5023 Acute on chronic systolic (congestive) heart failure: Secondary | ICD-10-CM | POA: Diagnosis present

## 2018-05-29 DIAGNOSIS — Z515 Encounter for palliative care: Secondary | ICD-10-CM | POA: Diagnosis not present

## 2018-05-29 DIAGNOSIS — Z992 Dependence on renal dialysis: Secondary | ICD-10-CM

## 2018-05-29 DIAGNOSIS — D631 Anemia in chronic kidney disease: Secondary | ICD-10-CM | POA: Diagnosis present

## 2018-05-29 DIAGNOSIS — Z7189 Other specified counseling: Secondary | ICD-10-CM

## 2018-05-29 DIAGNOSIS — I214 Non-ST elevation (NSTEMI) myocardial infarction: Secondary | ICD-10-CM | POA: Diagnosis not present

## 2018-05-29 DIAGNOSIS — F329 Major depressive disorder, single episode, unspecified: Secondary | ICD-10-CM | POA: Diagnosis present

## 2018-05-29 DIAGNOSIS — E875 Hyperkalemia: Secondary | ICD-10-CM | POA: Diagnosis present

## 2018-05-29 DIAGNOSIS — J810 Acute pulmonary edema: Secondary | ICD-10-CM

## 2018-05-29 DIAGNOSIS — Z6836 Body mass index (BMI) 36.0-36.9, adult: Secondary | ICD-10-CM

## 2018-05-29 DIAGNOSIS — Z66 Do not resuscitate: Secondary | ICD-10-CM | POA: Diagnosis present

## 2018-05-29 DIAGNOSIS — E669 Obesity, unspecified: Secondary | ICD-10-CM | POA: Diagnosis present

## 2018-05-29 DIAGNOSIS — N2581 Secondary hyperparathyroidism of renal origin: Secondary | ICD-10-CM | POA: Diagnosis present

## 2018-05-29 DIAGNOSIS — I252 Old myocardial infarction: Secondary | ICD-10-CM

## 2018-05-29 DIAGNOSIS — H81319 Aural vertigo, unspecified ear: Secondary | ICD-10-CM | POA: Diagnosis present

## 2018-05-29 DIAGNOSIS — Z8673 Personal history of transient ischemic attack (TIA), and cerebral infarction without residual deficits: Secondary | ICD-10-CM

## 2018-05-29 DIAGNOSIS — J9601 Acute respiratory failure with hypoxia: Secondary | ICD-10-CM | POA: Diagnosis present

## 2018-05-29 DIAGNOSIS — E1122 Type 2 diabetes mellitus with diabetic chronic kidney disease: Secondary | ICD-10-CM | POA: Diagnosis present

## 2018-05-29 DIAGNOSIS — N186 End stage renal disease: Secondary | ICD-10-CM | POA: Diagnosis not present

## 2018-05-29 DIAGNOSIS — Z7982 Long term (current) use of aspirin: Secondary | ICD-10-CM

## 2018-05-29 DIAGNOSIS — I25110 Atherosclerotic heart disease of native coronary artery with unstable angina pectoris: Secondary | ICD-10-CM | POA: Diagnosis present

## 2018-05-29 DIAGNOSIS — E871 Hypo-osmolality and hyponatremia: Secondary | ICD-10-CM | POA: Diagnosis present

## 2018-05-29 DIAGNOSIS — Z79899 Other long term (current) drug therapy: Secondary | ICD-10-CM

## 2018-05-29 DIAGNOSIS — Z888 Allergy status to other drugs, medicaments and biological substances status: Secondary | ICD-10-CM

## 2018-05-29 DIAGNOSIS — E782 Mixed hyperlipidemia: Secondary | ICD-10-CM | POA: Diagnosis present

## 2018-05-29 DIAGNOSIS — Z882 Allergy status to sulfonamides status: Secondary | ICD-10-CM

## 2018-05-29 DIAGNOSIS — Z7902 Long term (current) use of antithrombotics/antiplatelets: Secondary | ICD-10-CM

## 2018-05-29 DIAGNOSIS — J449 Chronic obstructive pulmonary disease, unspecified: Secondary | ICD-10-CM | POA: Diagnosis present

## 2018-05-29 DIAGNOSIS — E872 Acidosis: Secondary | ICD-10-CM | POA: Diagnosis present

## 2018-05-29 DIAGNOSIS — F17210 Nicotine dependence, cigarettes, uncomplicated: Secondary | ICD-10-CM | POA: Diagnosis present

## 2018-05-29 DIAGNOSIS — Z951 Presence of aortocoronary bypass graft: Secondary | ICD-10-CM

## 2018-05-29 DIAGNOSIS — Z86718 Personal history of other venous thrombosis and embolism: Secondary | ICD-10-CM

## 2018-05-29 DIAGNOSIS — I509 Heart failure, unspecified: Secondary | ICD-10-CM | POA: Diagnosis not present

## 2018-05-29 DIAGNOSIS — Z955 Presence of coronary angioplasty implant and graft: Secondary | ICD-10-CM

## 2018-05-29 DIAGNOSIS — Z96653 Presence of artificial knee joint, bilateral: Secondary | ICD-10-CM | POA: Diagnosis present

## 2018-05-29 LAB — COMPREHENSIVE METABOLIC PANEL
ALK PHOS: 59 U/L (ref 38–126)
ALT: 9 U/L (ref 0–44)
ANION GAP: 14 (ref 5–15)
AST: 15 U/L (ref 15–41)
Albumin: 3.8 g/dL (ref 3.5–5.0)
BUN: 76 mg/dL — ABNORMAL HIGH (ref 8–23)
CALCIUM: 8.3 mg/dL — AB (ref 8.9–10.3)
CO2: 20 mmol/L — AB (ref 22–32)
Chloride: 100 mmol/L (ref 98–111)
Creatinine, Ser: 11.03 mg/dL — ABNORMAL HIGH (ref 0.44–1.00)
GFR calc non Af Amer: 3 mL/min — ABNORMAL LOW (ref >60)
GFR, EST AFRICAN AMERICAN: 4 mL/min — AB (ref >60)
Glucose, Bld: 112 mg/dL — ABNORMAL HIGH (ref 70–99)
Potassium: 6.9 mmol/L (ref 3.5–5.1)
SODIUM: 134 mmol/L — AB (ref 135–145)
Total Bilirubin: 0.9 mg/dL (ref 0.3–1.2)
Total Protein: 8.3 g/dL — ABNORMAL HIGH (ref 6.5–8.1)

## 2018-05-29 LAB — BASIC METABOLIC PANEL
ANION GAP: 15 (ref 5–15)
Anion gap: 15 (ref 5–15)
BUN: 77 mg/dL — AB (ref 8–23)
BUN: 31 mg/dL — ABNORMAL HIGH (ref 8–23)
CALCIUM: 8.6 mg/dL — AB (ref 8.9–10.3)
CHLORIDE: 94 mmol/L — AB (ref 98–111)
CO2: 18 mmol/L — AB (ref 22–32)
CO2: 26 mmol/L (ref 22–32)
CREATININE: 11.19 mg/dL — AB (ref 0.44–1.00)
Calcium: 8.1 mg/dL — ABNORMAL LOW (ref 8.9–10.3)
Chloride: 99 mmol/L (ref 98–111)
Creatinine, Ser: 5.87 mg/dL — ABNORMAL HIGH (ref 0.44–1.00)
GFR calc Af Amer: 4 mL/min — ABNORMAL LOW (ref >60)
GFR calc non Af Amer: 3 mL/min — ABNORMAL LOW (ref >60)
GFR calc non Af Amer: 7 mL/min — ABNORMAL LOW (ref >60)
GFR, EST AFRICAN AMERICAN: 8 mL/min — AB (ref >60)
GLUCOSE: 113 mg/dL — AB (ref 70–99)
Glucose, Bld: 107 mg/dL — ABNORMAL HIGH (ref 70–99)
Potassium: >7.5 mmol/L (ref 3.5–5.1)
Potassium: 4 mmol/L (ref 3.5–5.1)
SODIUM: 135 mmol/L (ref 135–145)
Sodium: 132 mmol/L — ABNORMAL LOW (ref 135–145)

## 2018-05-29 LAB — CBC
HCT: 27.3 % — ABNORMAL LOW (ref 35.0–47.0)
Hemoglobin: 9.1 g/dL — ABNORMAL LOW (ref 12.0–16.0)
MCH: 30.7 pg (ref 26.0–34.0)
MCHC: 33.3 g/dL (ref 32.0–36.0)
MCV: 92 fL (ref 80.0–100.0)
PLATELETS: 196 10*3/uL (ref 150–440)
RBC: 2.97 MIL/uL — ABNORMAL LOW (ref 3.80–5.20)
RDW: 18.8 % — AB (ref 11.5–14.5)
WBC: 7.4 10*3/uL (ref 3.6–11.0)

## 2018-05-29 LAB — CBC WITH DIFFERENTIAL/PLATELET
BASOS ABS: 0.1 10*3/uL (ref 0–0.1)
Basophils Relative: 1 %
EOS ABS: 0.1 10*3/uL (ref 0–0.7)
Eosinophils Relative: 1 %
HCT: 28.3 % — ABNORMAL LOW (ref 35.0–47.0)
Hemoglobin: 9.3 g/dL — ABNORMAL LOW (ref 12.0–16.0)
Lymphocytes Relative: 19 %
Lymphs Abs: 1.6 10*3/uL (ref 1.0–3.6)
MCH: 30.5 pg (ref 26.0–34.0)
MCHC: 33 g/dL (ref 32.0–36.0)
MCV: 92.4 fL (ref 80.0–100.0)
MONO ABS: 0.8 10*3/uL (ref 0.2–0.9)
Monocytes Relative: 10 %
NEUTROS ABS: 5.8 10*3/uL (ref 1.4–6.5)
Neutrophils Relative %: 69 %
Platelets: 204 10*3/uL (ref 150–440)
RBC: 3.06 MIL/uL — ABNORMAL LOW (ref 3.80–5.20)
RDW: 19.2 % — ABNORMAL HIGH (ref 11.5–14.5)
WBC: 8.3 10*3/uL (ref 3.6–11.0)

## 2018-05-29 LAB — BRAIN NATRIURETIC PEPTIDE: B NATRIURETIC PEPTIDE 5: 1851 pg/mL — AB (ref 0.0–100.0)

## 2018-05-29 LAB — MRSA PCR SCREENING: MRSA by PCR: NEGATIVE

## 2018-05-29 LAB — TROPONIN I
TROPONIN I: 4.58 ng/mL — AB (ref <0.03)
Troponin I: 0.05 ng/mL (ref <0.03)

## 2018-05-29 LAB — MAGNESIUM
Magnesium: 2.3 mg/dL (ref 1.7–2.4)
Magnesium: 2.4 mg/dL (ref 1.7–2.4)

## 2018-05-29 LAB — PROTIME-INR
INR: 1.01
PROTHROMBIN TIME: 13.2 s (ref 11.4–15.2)

## 2018-05-29 LAB — TSH: TSH: 0.714 u[IU]/mL (ref 0.350–4.500)

## 2018-05-29 LAB — GLUCOSE, CAPILLARY: Glucose-Capillary: 111 mg/dL — ABNORMAL HIGH (ref 70–99)

## 2018-05-29 LAB — PHOSPHORUS: PHOSPHORUS: 9.7 mg/dL — AB (ref 2.5–4.6)

## 2018-05-29 LAB — APTT: aPTT: 37 seconds — ABNORMAL HIGH (ref 24–36)

## 2018-05-29 LAB — LIPASE, BLOOD: LIPASE: 19 U/L (ref 11–51)

## 2018-05-29 MED ORDER — SEVELAMER CARBONATE 2.4 G PO PACK
2.4000 g | PACK | Freq: Two times a day (BID) | ORAL | Status: DC
  Administered 2018-05-30: 2.4 g via ORAL
  Filled 2018-05-29 (×5): qty 1

## 2018-05-29 MED ORDER — SODIUM CHLORIDE 0.9 % IV SOLN: 100.0000 mL | INTRAVENOUS | Status: DC | PRN

## 2018-05-29 MED ORDER — NITROGLYCERIN 0.4 MG SL SUBL
0.4000 mg | SUBLINGUAL_TABLET | SUBLINGUAL | Status: DC | PRN
  Administered 2018-05-29 (×2): 0.4 mg via SUBLINGUAL
  Filled 2018-05-29 (×3): qty 1

## 2018-05-29 MED ORDER — RENA-VITE PO TABS
1.0000 | ORAL_TABLET | Freq: Every day | ORAL | Status: DC
  Administered 2018-05-29: 1 via ORAL
  Filled 2018-05-29: qty 1

## 2018-05-29 MED ORDER — TIOTROPIUM BROMIDE MONOHYDRATE 18 MCG IN CAPS
1.0000 | ORAL_CAPSULE | Freq: Every day | RESPIRATORY_TRACT | Status: DC
  Administered 2018-05-29 – 2018-05-30 (×2): 18 ug via RESPIRATORY_TRACT
  Filled 2018-05-29: qty 5

## 2018-05-29 MED ORDER — DOCUSATE SODIUM 100 MG PO CAPS
100.0000 mg | ORAL_CAPSULE | Freq: Two times a day (BID) | ORAL | Status: DC
  Administered 2018-05-29 – 2018-05-30 (×3): 100 mg via ORAL
  Filled 2018-05-29 (×3): qty 1

## 2018-05-29 MED ORDER — METOPROLOL SUCCINATE ER 50 MG PO TB24
100.0000 mg | ORAL_TABLET | Freq: Every day | ORAL | Status: DC
  Administered 2018-05-30: 100 mg via ORAL
  Filled 2018-05-29: qty 2

## 2018-05-29 MED ORDER — LIDOCAINE 5 % EX PTCH
1.0000 | MEDICATED_PATCH | CUTANEOUS | Status: DC
  Filled 2018-05-29 (×3): qty 1

## 2018-05-29 MED ORDER — VARENICLINE TARTRATE 0.5 MG PO TABS
0.5000 mg | ORAL_TABLET | Freq: Every day | ORAL | Status: DC
  Filled 2018-05-29 (×2): qty 1

## 2018-05-29 MED ORDER — FOLIC ACID 1 MG PO TABS
1.0000 mg | ORAL_TABLET | Freq: Every day | ORAL | Status: DC
  Administered 2018-05-29 – 2018-05-30 (×2): 1 mg via ORAL
  Filled 2018-05-29 (×2): qty 1

## 2018-05-29 MED ORDER — ACETAMINOPHEN 650 MG RE SUPP: 650.0000 mg | Freq: Four times a day (QID) | RECTAL | Status: DC | PRN

## 2018-05-29 MED ORDER — ALBUTEROL SULFATE (2.5 MG/3ML) 0.083% IN NEBU: 2.5000 mg | INHALATION_SOLUTION | RESPIRATORY_TRACT | Status: DC | PRN

## 2018-05-29 MED ORDER — ALTEPLASE 2 MG IJ SOLR: 2.0000 mg | Freq: Once | INTRAMUSCULAR | Status: DC | PRN

## 2018-05-29 MED ORDER — RANOLAZINE ER 500 MG PO TB12
500.0000 mg | ORAL_TABLET | Freq: Two times a day (BID) | ORAL | Status: DC
  Administered 2018-05-29 (×2): 500 mg via ORAL
  Filled 2018-05-29 (×4): qty 1

## 2018-05-29 MED ORDER — POLYETHYLENE GLYCOL 3350 17 G PO PACK
17.0000 g | PACK | Freq: Every day | ORAL | Status: DC
  Administered 2018-05-29 – 2018-05-30 (×2): 17 g via ORAL
  Filled 2018-05-29 (×2): qty 1

## 2018-05-29 MED ORDER — PANTOPRAZOLE SODIUM 40 MG PO TBEC
40.0000 mg | DELAYED_RELEASE_TABLET | Freq: Two times a day (BID) | ORAL | Status: DC
  Administered 2018-05-29 – 2018-05-30 (×3): 40 mg via ORAL
  Filled 2018-05-29 (×3): qty 1

## 2018-05-29 MED ORDER — HEPARIN (PORCINE) IN NACL 100-0.45 UNIT/ML-% IJ SOLN
1100.0000 [IU]/h | INTRAMUSCULAR | Status: DC
  Administered 2018-05-29: 1000 [IU]/h via INTRAVENOUS
  Administered 2018-05-30: 1100 [IU]/h via INTRAVENOUS
  Filled 2018-05-29 (×2): qty 250

## 2018-05-29 MED ORDER — NITROGLYCERIN IN D5W 200-5 MCG/ML-% IV SOLN
0.0000 ug/min | INTRAVENOUS | Status: DC
  Administered 2018-05-29: 5 ug/min via INTRAVENOUS
  Filled 2018-05-29: qty 250

## 2018-05-29 MED ORDER — LIDOCAINE HCL (PF) 1 % IJ SOLN
5.0000 mL | INTRAMUSCULAR | Status: DC | PRN
  Filled 2018-05-29: qty 5

## 2018-05-29 MED ORDER — SODIUM CHLORIDE 0.9 % IV SOLN
1.0000 g | Freq: Once | INTRAVENOUS | Status: AC
  Administered 2018-05-29: 1 g via INTRAVENOUS
  Filled 2018-05-29: qty 10

## 2018-05-29 MED ORDER — MORPHINE SULFATE (PF) 2 MG/ML IV SOLN
1.0000 mg | Freq: Once | INTRAVENOUS | Status: AC
  Administered 2018-05-29: 1 mg via INTRAVENOUS
  Filled 2018-05-29: qty 1

## 2018-05-29 MED ORDER — DOXYCYCLINE HYCLATE 100 MG PO TABS
100.0000 mg | ORAL_TABLET | Freq: Two times a day (BID) | ORAL | Status: DC
  Administered 2018-05-29 – 2018-05-30 (×3): 100 mg via ORAL
  Filled 2018-05-29 (×4): qty 1

## 2018-05-29 MED ORDER — NYSTATIN 100000 UNIT/ML MT SUSP
5.0000 mL | Freq: Four times a day (QID) | OROMUCOSAL | Status: DC
  Administered 2018-05-29 – 2018-05-30 (×3): 500000 [IU] via OROMUCOSAL
  Filled 2018-05-29 (×8): qty 5

## 2018-05-29 MED ORDER — ONDANSETRON HCL 4 MG PO TABS: 4.0000 mg | ORAL_TABLET | Freq: Four times a day (QID) | ORAL | Status: DC | PRN

## 2018-05-29 MED ORDER — ONDANSETRON HCL 4 MG/2ML IJ SOLN
4.0000 mg | Freq: Four times a day (QID) | INTRAMUSCULAR | Status: DC | PRN
  Administered 2018-05-29: 4 mg via INTRAVENOUS
  Filled 2018-05-29: qty 2

## 2018-05-29 MED ORDER — ACETAMINOPHEN 325 MG PO TABS
650.0000 mg | ORAL_TABLET | Freq: Four times a day (QID) | ORAL | Status: DC | PRN
  Administered 2018-05-29: 650 mg via ORAL
  Filled 2018-05-29: qty 2

## 2018-05-29 MED ORDER — TORSEMIDE 20 MG PO TABS
100.0000 mg | ORAL_TABLET | Freq: Every day | ORAL | Status: DC
  Administered 2018-05-30: 100 mg via ORAL
  Filled 2018-05-29: qty 1
  Filled 2018-05-29: qty 5
  Filled 2018-05-29: qty 1

## 2018-05-29 MED ORDER — AMLODIPINE BESYLATE 10 MG PO TABS
10.0000 mg | ORAL_TABLET | Freq: Every day | ORAL | Status: DC
  Administered 2018-05-29 – 2018-05-30 (×2): 10 mg via ORAL
  Filled 2018-05-29: qty 2
  Filled 2018-05-29: qty 1

## 2018-05-29 MED ORDER — CLOPIDOGREL BISULFATE 75 MG PO TABS
75.0000 mg | ORAL_TABLET | Freq: Every day | ORAL | Status: DC
  Administered 2018-05-29 – 2018-05-30 (×2): 75 mg via ORAL
  Filled 2018-05-29 (×2): qty 1

## 2018-05-29 MED ORDER — HEPARIN SODIUM (PORCINE) 5000 UNIT/ML IJ SOLN
5000.0000 [IU] | Freq: Three times a day (TID) | INTRAMUSCULAR | Status: DC
  Administered 2018-05-29: 5000 [IU] via SUBCUTANEOUS
  Filled 2018-05-29: qty 1

## 2018-05-29 MED ORDER — HEPARIN SODIUM (PORCINE) 1000 UNIT/ML DIALYSIS: 1000.0000 [IU] | INTRAMUSCULAR | Status: DC | PRN

## 2018-05-29 MED ORDER — LIDOCAINE-PRILOCAINE 2.5-2.5 % EX CREA
1.0000 "application " | TOPICAL_CREAM | CUTANEOUS | Status: DC | PRN
  Filled 2018-05-29: qty 5

## 2018-05-29 MED ORDER — CINACALCET HCL 30 MG PO TABS
30.0000 mg | ORAL_TABLET | Freq: Every day | ORAL | Status: DC
  Filled 2018-05-29 (×3): qty 1

## 2018-05-29 MED ORDER — CALCIUM CARBONATE ANTACID 500 MG PO CHEW
2.0000 | CHEWABLE_TABLET | Freq: Three times a day (TID) | ORAL | Status: DC
  Administered 2018-05-29 – 2018-05-30 (×4): 400 mg via ORAL
  Filled 2018-05-29 (×6): qty 2

## 2018-05-29 MED ORDER — VENLAFAXINE HCL ER 37.5 MG PO CP24
37.5000 mg | ORAL_CAPSULE | Freq: Every day | ORAL | Status: DC
  Administered 2018-05-30: 37.5 mg via ORAL
  Filled 2018-05-29 (×3): qty 1

## 2018-05-29 MED ORDER — INFLUENZA VAC SPLIT HIGH-DOSE 0.5 ML IM SUSY
0.5000 mL | PREFILLED_SYRINGE | INTRAMUSCULAR | Status: DC
  Filled 2018-05-29: qty 0.5

## 2018-05-29 MED ORDER — HEPARIN BOLUS VIA INFUSION
4000.0000 [IU] | Freq: Once | INTRAVENOUS | Status: AC
  Administered 2018-05-29: 4000 [IU] via INTRAVENOUS
  Filled 2018-05-29: qty 4000

## 2018-05-29 MED ORDER — NITROGLYCERIN 0.4 MG/SPRAY TL SOLN: 2.0000 | Status: DC | PRN

## 2018-05-29 MED ORDER — FLUTICASONE PROPIONATE 50 MCG/ACT NA SUSP
2.0000 | Freq: Two times a day (BID) | NASAL | Status: DC
  Administered 2018-05-29 – 2018-05-30 (×3): 2 via NASAL
  Filled 2018-05-29: qty 16

## 2018-05-29 MED ORDER — FLUTICASONE FUROATE-VILANTEROL 200-25 MCG/INH IN AEPB
1.0000 | INHALATION_SPRAY | Freq: Every day | RESPIRATORY_TRACT | Status: DC
  Administered 2018-05-29 – 2018-05-30 (×2): 1 via RESPIRATORY_TRACT
  Filled 2018-05-29: qty 28

## 2018-05-29 MED ORDER — NEPRO/CARBSTEADY PO LIQD: 237.0000 mL | ORAL | Status: DC

## 2018-05-29 MED ORDER — LUBIPROSTONE 24 MCG PO CAPS
24.0000 ug | ORAL_CAPSULE | Freq: Two times a day (BID) | ORAL | Status: DC
  Administered 2018-05-29 – 2018-05-30 (×3): 24 ug via ORAL
  Filled 2018-05-29 (×5): qty 1

## 2018-05-29 MED ORDER — ATORVASTATIN CALCIUM 20 MG PO TABS
40.0000 mg | ORAL_TABLET | Freq: Every day | ORAL | Status: DC
  Administered 2018-05-29: 40 mg via ORAL
  Filled 2018-05-29: qty 2

## 2018-05-29 MED ORDER — CHLORHEXIDINE GLUCONATE CLOTH 2 % EX PADS
6.0000 | MEDICATED_PAD | Freq: Every day | CUTANEOUS | Status: DC
  Administered 2018-05-30: 6 via TOPICAL

## 2018-05-29 MED ORDER — SODIUM ZIRCONIUM CYCLOSILICATE 5 G PO PACK
10.0000 g | PACK | Freq: Three times a day (TID) | ORAL | Status: DC
  Administered 2018-05-29 – 2018-05-30 (×3): 10 g via ORAL
  Filled 2018-05-29 (×7): qty 2

## 2018-05-29 MED ORDER — MECLIZINE HCL 25 MG PO TABS
25.0000 mg | ORAL_TABLET | Freq: Three times a day (TID) | ORAL | Status: DC | PRN
  Filled 2018-05-29: qty 1

## 2018-05-29 MED ORDER — PENTAFLUOROPROP-TETRAFLUOROETH EX AERO
1.0000 "application " | INHALATION_SPRAY | CUTANEOUS | Status: DC | PRN
  Filled 2018-05-29: qty 30

## 2018-05-29 MED ORDER — ASPIRIN 81 MG PO CHEW
81.0000 mg | CHEWABLE_TABLET | Freq: Every day | ORAL | Status: DC
  Administered 2018-05-29 – 2018-05-30 (×2): 81 mg via ORAL
  Filled 2018-05-29 (×2): qty 1

## 2018-05-29 MED ORDER — LORATADINE 10 MG PO TABS
10.0000 mg | ORAL_TABLET | Freq: Every day | ORAL | Status: DC
  Administered 2018-05-29 – 2018-05-30 (×2): 10 mg via ORAL
  Filled 2018-05-29 (×2): qty 1

## 2018-05-29 MED ORDER — PNEUMOCOCCAL VAC POLYVALENT 25 MCG/0.5ML IJ INJ: 0.5000 mL | INJECTION | INTRAMUSCULAR | Status: DC

## 2018-05-29 MED ORDER — OXYCODONE HCL 5 MG PO TABS
5.0000 mg | ORAL_TABLET | Freq: Four times a day (QID) | ORAL | Status: DC | PRN
  Filled 2018-05-29: qty 1

## 2018-05-29 NOTE — Progress Notes (Signed)
Pre HD assessment    05/29/18 0720  Vital Signs  Temp 98.4 F (36.9 C)  Temp Source Axillary  Pulse Rate 60  Pulse Rate Source Monitor  Resp (!) 24  BP (!) 153/81  BP Location Right Arm  BP Method Automatic  Patient Position (if appropriate) Lying  Oxygen Therapy  SpO2 100 %  O2 Device Bi-PAP  Pain Assessment  Pain Scale 0-10  Pain Score 0  Dialysis Weight  Weight 104 kg  Type of Weight Pre-Dialysis  Time-Out for Hemodialysis  What Procedure? HD  Pt Identifiers(min of two) First/Last Name;MRN/Account#  Correct Site? Yes  Correct Side? Yes  Correct Procedure? Yes  Consents Verified? Yes  Rad Studies Available? N/A  Safety Precautions Reviewed? Yes  Engineer, civil (consulting) Number  (6A)  Station Number  (bedside ICU 15)  UF/Alarm Test Passed  Conductivity: Meter 13.8  Conductivity: Machine  13.9  pH 7.6  Reverse Osmosis  (WRO #1)  Normal Saline Lot Number [id number]  Dialyzer Lot Number [id number]  Disposable Set Lot Number [id number]  Machine Temperature 98.6 F (37 C)  Musician and Audible Yes  Blood Lines Intact and Secured Yes  Pre Treatment Patient Checks  Vascular access used during treatment Fistula  Hepatitis B Surface Antigen Results Negative  Date Hepatitis B Surface Antigen Drawn 01/11/18  Hepatitis B Surface Antibody  (>10)  Date Hepatitis B Surface Antibody Drawn 01/11/18  Hemodialysis Consent Verified Yes  Hemodialysis Standing Orders Initiated Yes  ECG (Telemetry) Monitor On Yes  Prime Ordered Normal Saline  Length of  DialysisTreatment -hour(s) 3 Hour(s)  Dialyzer Elisio 17H NR  Dialysate 1K (1K for 1 hour then 2K for remainder of tx)  Dialysis Anticoagulant None  Dialysate Flow Ordered 800  Blood Flow Rate Ordered 400 mL/min  Ultrafiltration Goal 2 Liters  Pre Treatment Labs Phosphorus (PTH)  Dialysis Blood Pressure Support Ordered Normal Saline  Education / Care Plan  Dialysis Education Provided Yes  Documented Education in Care  Plan Yes

## 2018-05-29 NOTE — ED Notes (Signed)
Pt awaiting admission, resting comfortably without distress.

## 2018-05-29 NOTE — Progress Notes (Signed)
[facility] [place] NAME: Liam Bossman    MR#:  [id number]  DATE OF BIRTH:  June 28, 1951  SUBJECTIVE:  CHIEF COMPLAINT: Shortness of breath is better had hemodialysis at bedside in ICU  REVIEW OF SYSTEMS:  CONSTITUTIONAL: No fever, fatigue or weakness.  EYES: No blurred or double vision.  EARS, NOSE, AND THROAT: No tinnitus or ear pain.  RESPIRATORY: No cough, improving shortness of breath, denies wheezing or hemoptysis.  CARDIOVASCULAR: No chest pain, orthopnea, edema.  GASTROINTESTINAL: No nausea, vomiting, diarrhea or abdominal pain.  GENITOURINARY: No dysuria, hematuria.  ENDOCRINE: No polyuria, nocturia,  HEMATOLOGY: No anemia, easy bruising or bleeding SKIN: No rash or lesion. MUSCULOSKELETAL: No joint pain or arthritis.   NEUROLOGIC: No tingling, numbness, weakness.  PSYCHIATRY: No anxiety or depression.   DRUG ALLERGIES:   Allergies  Allergen Reactions  . Nystatin Hives and Itching  . Sulfa Antibiotics Swelling, Hives and Rash  . Niaspan [Niacin] Other (See Comments) and Hives    VITALS:  Blood pressure (!) 160/71, pulse 91, temperature 98.8 F (37.1 C), temperature source Oral, resp. rate (!) 24, height 5\' 6"  (1.676 m), weight 103.4 kg, SpO2 100 %.  PHYSICAL EXAMINATION:  GENERAL:  67 y.o.-year-old patient lying in the bed with no acute distress.  EYES: Pupils equal, round, reactive to light and accommodation. No scleral icterus. Extraocular muscles intact.  HEENT: Head atraumatic, normocephalic. Oropharynx and nasopharynx clear.  NECK:  Supple, no jugular venous distention. No thyroid enlargement, no tenderness.  LUNGS: Moderate breath sounds bilaterally, no wheezing, rales,rhonchi or crepitation. No use of accessory muscles of respiration.  CARDIOVASCULAR: S1, S2 normal. No murmurs, rubs, or gallops.  ABDOMEN: Soft, nontender, nondistended. Bowel sounds present. No organomegaly or mass.  EXTREMITIES: No pedal  edema, cyanosis, or clubbing.  Chronic resting tremors NEUROLOGIC: Cranial nerves II through XII are intact.  Sensation intact. Gait not checked.  PSYCHIATRIC: The patient is alert and oriented x 3.  SKIN: No obvious rash, lesion, or ulcer.    LABORATORY PANEL:   CBC Recent Labs  Lab 05/29/18 0750  WBC 7.4  HGB 9.1*  HCT 27.3*  PLT 196   ------------------------------------------------------------------------------------------------------------------  Chemistries  Recent Labs  Lab 05/29/18 0228  05/29/18 0751 05/29/18 1303  NA 134*   < >  --  135  K 6.9*   < >  --  4.0  CL 100   < >  --  94*  CO2 20*   < >  --  26  GLUCOSE 112*   < >  --  107*  BUN 76*   < >  --  31*  CREATININE 11.03*   < >  --  5.87*  CALCIUM 8.3*   < >  --  8.6*  MG 2.3  --  2.4  --   AST 15  --   --   --   ALT 9  --   --   --   ALKPHOS 59  --   --   --   BILITOT 0.9  --   --   --    < > = values in this interval not displayed.   ------------------------------------------------------------------------------------------------------------------  Cardiac Enzymes Recent Labs  Lab 05/29/18 1303  TROPONINI 0.05*   ------------------------------------------------------------------------------------------------------------------  RADIOLOGY:  Dg Chest Port 1 View  Result Date: 05/29/2018 CLINICAL DATA:  Respiratory distress.  Missed dialysis 2 days ago. EXAM: PORTABLE CHEST 1 VIEW COMPARISON:  04/18/2018, chest CT 12/17/2015  FINDINGS: Unchanged cardiomegaly and mediastinal contours. Moderate pulmonary edema, increased from prior exam. No confluent airspace disease. No large pleural effusion or pneumothorax. 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 IMPRESSION: Moderate pulmonary edema with unchanged cardiomegaly. Electronically Signed   By: Keith Rake M.D.   On: 05/29/2018 03:00    EKG:   Orders placed or performed during  the hospital encounter of 05/29/18  . ED EKG  . ED EKG  . EKG 12-Lead  . EKG 12-Lead  . EKG 12-Lead  . EKG 12-Lead    ASSESSMENT AND PLAN:   This is a 67 year old female admitted for respiratory failure. 1.  Respiratory failure: Acute; with hypoxia.  Secondary to fluid overload.  The patient has missed dialysis.   Had hemodialysis today.  Off BiPAP Nephrology is following  2.  CHF: Acute on chronic; systolic.    Hemodialysis done today  Reportedly, the patient makes some urine.  Resume torsemide.  Continue metoprolol  3.  ESRD: On hemodialysis.    Follow-up with nephrology discussed with Dr. Zollie Scale  Continue Renvela and Sensipar  4.  Diabetes mellitus type 2: Last A1c 2 months ago 5.6; continue diet control.  5.  CAD: Stable; continue aspirin, Plavix and Ranexa  6.  COPD: Continue Spiriva and inhaled corticosteroid and long-acting bronchial agonist  7.  DVT prophylaxis: Heparin subcu  8.  GI prophylaxis: Pantoprazole per home regimen     All the records are reviewed and case discussed with Care Management/Social Workerr. Management plans discussed with the patient, family and they are in agreement.  CODE STATUS: DNR   TOTAL TIME TAKING CARE OF THIS PATIENT: 33 minutes.   POSSIBLE D/C IN 1-2  DAYS, DEPENDING ON CLINICAL CONDITION.  Note: This dictation was prepared with Dragon dictation along with smaller phrase technology. Any transcriptional errors that result from this process are unintentional.   Nicholes Mango M.D on 05/29/2018 at 3:52 PM  Between 7am to 6pm - Pager - [phone] After 6pm go to [email or web address] - password [email or web address] [facility]  [facility]  Office  [phone]  CC: Primary care physician; Clinic, [facility]

## 2018-05-29 NOTE — Progress Notes (Signed)
Post HD assessment    05/29/18 1059  Neurological  Level of Consciousness Alert  Orientation Level Oriented X4  Respiratory  Respiratory Pattern Tachypnea  Chest Assessment Chest expansion symmetrical  Cardiac  ECG Monitor Yes  Vascular  R Radial Pulse +2  L Radial Pulse +2  Edema Generalized  Integumentary  Integumentary (WDL) X  Skin Color Appropriate for ethnicity  Musculoskeletal  Musculoskeletal (WDL) X  Generalized Weakness Yes  Assistive Device None  GU Assessment  Genitourinary (WDL) X  Genitourinary Symptoms  (HD)  Psychosocial  Psychosocial (WDL) WDL

## 2018-05-29 NOTE — Progress Notes (Signed)
Post HD assessment. Pt tolerated tx well without c/o or complication. Net UF 2009, goal met.    05/29/18 1100  Vital Signs  Temp 98.8 F (37.1 C)  Temp Source Oral  Pulse Rate 91  Pulse Rate Source Monitor  Resp (!) 24  BP (!) 155/71  BP Location Right Arm  BP Method Automatic  Patient Position (if appropriate) Lying  Oxygen Therapy  SpO2 100 %  O2 Device Nasal Cannula  O2 Flow Rate (L/min) 4 L/min  Dialysis Weight  Weight 103.4 kg  Type of Weight Post-Dialysis  Post-Hemodialysis Assessment  Rinseback Volume (mL) 250 mL  KECN 71.1 V  Dialyzer Clearance Lightly streaked  Duration of HD Treatment -hour(s) 3 hour(s)  Hemodialysis Intake (mL) 500 mL  UF Total -Machine (mL) 2509 mL  Net UF (mL) 2009 mL  Tolerated HD Treatment Yes  AVG/AVF Arterial Site Held (minutes) 10 minutes  AVG/AVF Venous Site Held (minutes) 10 minutes  Education / Care Plan  Dialysis Education Provided Yes  Documented Education in Care Plan Yes

## 2018-05-29 NOTE — Consult Note (Signed)
PULMONARY / CRITICAL CARE MEDICINE   Name: Kimberly Shaffer MRN: [id number] DOB: 10-13-1950    ADMISSION DATE:  05/29/2018 CONSULTATION DATE:  05/29/2018  REFERRING MD:  Dr. Marcille Blanco  CHIEF COMPLAINT:  Acute Respiratory Distress  HISTORY OF PRESENT ILLNESS:   Kimberly Shaffer is a [name] with a PMH as listed below who presented to [facility] ED on 05/29/18 with c/o Respiratory Distress requiring BiPAP.  She reports progressive shortness of breath for approximately 2 days.  She denies wheezing, cough, fever/chills, or sick contacts.  She does report swelling to bilateral LE.  Pt has ESRD and is on HD (T, Th, S), and she reports that she missed her HD session on Saturday 05/26/18, with her last HD session completed on Thursday 05/24/18.  Initial workup in the ED reveals BNP 1851, negative troponin, Na 134, K 6.9, Creatinine 11, Anion gap 14, and serum Bicarb 20.  CXR is concerning for moderate pulmonary edema.  In the ED she received 1g Ca Gluconate and Sodium Zirconium for Hyperkalemia.  She is admitted to [facility] for treatment of Acute Hypoxic Respiratory Failure requiring BiPAP secondary to volume overload from missed HD session and Hyperkalemia.  PCCM is consulted for further management.  PAST MEDICAL HISTORY :  She  has a past medical history of Asthma, CAD (coronary artery disease), Chronic lower back pain, COPD (chronic obstructive pulmonary disease) ([place]), Depression, Diabetes mellitus without complication ([place]), H/O angina pectoris, H/O blood clots, Hypercholesteremia, Hypertension, Left rotator cuff tear, Myocardial infarction ([place]), Obesity, Renal insufficiency, Stroke ([place]), and Vertigo, aural.  PAST SURGICAL HISTORY: She  has a past surgical history that includes Cardiac catheterization; Coronary stent placement; Knee surgery (Bilateral); Replacement total knee bilateral (Bilateral); Coronary artery bypass graft (2012); Joint replacement; AV fistula placement (Left, 11-04-2015); AV fistula  placement (Left, 11/04/2015); Cardiac catheterization (N/A, 11/13/2015); Cardiac catheterization (N/A, 12/04/2015); Cardiac catheterization (Left, 12/31/2015); Cardiac catheterization (N/A, 12/31/2015); Cardiac catheterization (Left, 02/25/2016); Cardiac catheterization (N/A, 03/18/2016); Cardiac catheterization (Left, 07/19/2016); A/V Fistulagram (Left, 03/14/2017); Removal of a dialysis catheter (N/A, 06/30/2017); LEFT HEART CATH AND CORONARY ANGIOGRAPHY (N/A, 12/25/2017); and LEFT HEART CATH AND CORONARY ANGIOGRAPHY (Right, 03/23/2018).  Allergies  Allergen Reactions  . Nystatin Hives and Itching  . Sulfa Antibiotics Swelling, Hives and Rash  . Niaspan [Niacin] Other (See Comments) and Hives    No current facility-administered medications on file prior to encounter.    Current Outpatient Medications on File Prior to Encounter  Medication Sig  . acetaminophen (TYLENOL) 325 MG tablet Take 2 tablets (650 mg total) by mouth every 4 (four) hours as needed for headache or mild pain.  Marland Kitchen albuterol (PROVENTIL HFA;VENTOLIN HFA) 108 (90 BASE) MCG/ACT inhaler Inhale 2 puffs into the lungs every 6 (six) hours as needed for wheezing or shortness of breath.   Marland Kitchen amLODipine (NORVASC) 10 MG tablet Take 1 tablet (10 mg total) by mouth daily.  Marland Kitchen aspirin EC 81 MG tablet Take 81 mg by mouth daily.  Marland Kitchen atorvastatin (LIPITOR) 40 MG tablet Take 40 mg by mouth at bedtime.  . calcium carbonate (OSCAL) 1500 (600 Ca) MG TABS tablet Take 600 mg of elemental calcium by mouth daily with breakfast.  . clopidogrel (PLAVIX) 75 MG tablet Take 75 mg by mouth daily.  Marland Kitchen gabapentin (NEURONTIN) 100 MG capsule Take 100 mg by mouth at bedtime.  . heparin 100-0.45 UNIT/ML-% infusion Inject 1,200 Units/hr into the vein continuous.  . isosorbide mononitrate (IMDUR) 30 MG 24 hr tablet Take 1 tablet (30 mg total)  by mouth daily.  . methocarbamol (ROBAXIN) 750 MG tablet Take 750 mg by mouth 2 (two) times daily as needed for muscle spasms.   .  metoprolol succinate (TOPROL-XL) 100 MG 24 hr tablet Take 1 tablet (100 mg total) by mouth daily. Take with or immediately following a meal.  . multivitamin (RENA-VIT) TABS tablet Take 1 tablet by mouth at bedtime.  . nitroGLYCERIN (NITROLINGUAL) 0.4 MG/SPRAY spray Place 2 sprays under the tongue every 5 (five) minutes x 3 doses as needed for chest pain.   . Nutritional Supplements (FEEDING SUPPLEMENT, NEPRO CARB STEADY,) LIQD Take 237 mLs by mouth daily.  . Oxycodone HCl 10 MG TABS Take 10 mg by mouth every 6 (six) hours as needed (pain).   . pantoprazole (PROTONIX) 40 MG tablet Take 40 mg by mouth 2 (two) times daily.  . ranolazine (RANEXA) 500 MG 12 hr tablet Take 500 mg by mouth 2 (two) times daily.  Marland Kitchen RENVELA 2.4 g PACK Take 2.4 g by mouth 2 (two) times daily.  . rosuvastatin (CRESTOR) 20 MG tablet Take 1 tablet (20 mg total) by mouth daily.  . SENSIPAR 30 MG tablet Take 1 tablet (30 mg total) by mouth daily.  Marland Kitchen SPIRIVA HANDIHALER 18 MCG inhalation capsule Place 1 capsule into inhaler and inhale daily.  Marland Kitchen torsemide (DEMADEX) 100 MG tablet Take 1 tablet (100 mg total) by mouth daily.  Marland Kitchen venlafaxine XR (EFFEXOR-XR) 37.5 MG 24 hr capsule Take 37.5 mg by mouth daily with breakfast.    FAMILY HISTORY:  Her She indicated that her mother is deceased. She indicated that her father is deceased. She indicated that her brother is deceased.   SOCIAL HISTORY: She  reports that she has been smoking cigarettes. She has been smoking about 0.25 packs per day. She has never used smokeless tobacco. She reports that she does not drink alcohol or use drugs.  REVIEW OF SYSTEMS:   Positives in BOLD: Gen: Denies fever, chills, +weight change, fatigue, night sweats HEENT: Denies blurred vision, double vision, hearing loss, tinnitus, sinus congestion, rhinorrhea, sore throat, neck stiffness, dysphagia PULM: Denies +shortness of breath, cough, sputum production, hemoptysis, wheezing CV: Denies chest pain,  +edema, orthopnea, paroxysmal nocturnal dyspnea, palpitations GI: Denies abdominal pain, nausea, vomiting, diarrhea, hematochezia, melena, constipation, change in bowel habits GU: Denies dysuria, hematuria, polyuria, oliguria, urethral discharge Endocrine: Denies hot or cold intolerance, polyuria, polyphagia or appetite change Derm: Denies rash, dry skin, scaling or peeling skin change Heme: Denies easy bruising, bleeding, bleeding gums Neuro: Denies headache, numbness, weakness, slurred speech, loss of memory or consciousness   SUBJECTIVE:  Pt reports that her SOB has improved since admission Denies fever, chills, cough, sick contacts, or wheezing Afebrile Reports she wants to eat  VITAL SIGNS: BP 140/65 (BP Location: Right Arm)   Pulse (!) 52   Resp 20   Ht 5\' 5"  (1.651 m)   Wt 102.1 kg   SpO2 100%   BMI 37.44 kg/m   HEMODYNAMICS:    VENTILATOR SETTINGS:    INTAKE / OUTPUT: No intake/output data recorded.  PHYSICAL EXAMINATION: General:  Acute on chronically ill appearing female, laying in bed, on BiPAP, in NAD Neuro:  Awake, A&O, Follows commands, no focal deficits HEENT:  Atraumatic, normocephalic, neck supple, + JVD Cardiovascular:  RRR, s1s2, no M/R/G Lungs:  Fine crackles bilaterally upon auscultation, no wheezing.  Even, nonlabored, BiPAP assisted Abdomen:  Obese, soft, nontender, nondistended, BS+ x4 Musculoskeletal:  No deformities, normal bulk and tone, 1+ pedal pulses  Skin:  Warm, dry.  No obvious rashes, lesions, or ulcerations  LABS:  BMET Recent Labs  Lab 05/29/18 0228  NA 134*  K 6.9*  CL 100  CO2 20*  BUN 76*  CREATININE 11.03*  GLUCOSE 112*    Electrolytes Recent Labs  Lab 05/29/18 0228  CALCIUM 8.3*  MG 2.3    CBC Recent Labs  Lab 05/29/18 0228  WBC 8.3  HGB 9.3*  HCT 28.3*  PLT 204    Coag's No results for input(s): APTT, INR in the last 168 hours.  Sepsis Markers No results for input(s): LATICACIDVEN, PROCALCITON,  O2SATVEN in the last 168 hours.  ABG No results for input(s): PHART, PCO2ART, PO2ART in the last 168 hours.  Liver Enzymes Recent Labs  Lab 05/29/18 0228  AST 15  ALT 9  ALKPHOS 59  BILITOT 0.9  ALBUMIN 3.8    Cardiac Enzymes Recent Labs  Lab 05/29/18 0228  TROPONINI <0.03    Glucose No results for input(s): GLUCAP in the last 168 hours.  Imaging Dg Chest Port 1 View  Result Date: 05/29/2018 CLINICAL DATA:  Respiratory distress.  Missed dialysis 2 days ago. EXAM: PORTABLE CHEST 1 VIEW COMPARISON:  04/18/2018, chest CT 12/17/2015 FINDINGS: Unchanged cardiomegaly and mediastinal contours. Moderate pulmonary edema, increased from prior exam. No confluent airspace disease. No large pleural effusion or pneumothorax. 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 IMPRESSION: Moderate pulmonary edema with unchanged cardiomegaly. Electronically Signed   By: Keith Rake M.D.   On: 05/29/2018 03:00     STUDIES:  CXR 05/29/18>> Moderate pulmonary edema with unchanged cardiomegaly.  CULTURES:  ANTIBIOTICS: Doxycycline 05/29/18>>  SIGNIFICANT EVENTS: 05/29/18>> Admission to [facility] Stepdown  LINES/TUBES:  DISCUSSION: [name] admitted with Acute Hypoxic Respiratory Failure requiring BiPAP in setting of volume overload due to missed HD session, pt also with ESRD on HD, Hyperkalemia, and Anion Gap Metabolic Acidosis.  ASSESSMENT / PLAN:  PULMONARY A: Acute Hypoxic Respiratory failure in setting of volume overload due to missed HD session Hx: COPD, Asthma P:   Supplemental O2 to maintain O2 sats 88 to 94% BiPAP, wean as tolerated to [place] Bronchodilators Continue Breo-Ellipta, Spiriva, Fluticasone, Torsemide Doxycycline Volume removal via HD  CARDIOVASCULAR A:  Elevated BNP in setting of volume overload -BNP 1851 Hx: MI, CVA, HTN, HLD, CAD P:  Cardiac monitoring Maintain MAP >60 Pt to have HD for  volume removal Continue home Torsemide, Norvasc Follow intermittent CXR  RENAL A:   ESRD on HD (T, Th, S) Hyperkalemia Anion gap Metabolic acidosis in setting of ESRD Mild Hyponatremia P:   Nephrology consulted, appreciate input Urgent HD per Nephrology Monitor I&O's / urinary output Follow BMP Ensure adequate renal perfusion Avoid nephrotoxic agents as able Replace electrolytes as indicated Received 1g Ca Gluconate and Sodium Zirconium in ED, repeat BMP pending   GASTROINTESTINAL A:   No active issues P:   Diet (renal) as tolerated Fluid restriction Continue PO Protonix  HEMATOLOGIC A:   Anemia without signs of bleeding P:  Monitor for s/sx of bleeding Trend CBC's Heparin SQ for VTE prophylaxis Transfuse for Hgb <7  INFECTIOUS A:   No active issues P:   Monitor fever curve Trend WBC's  ENDOCRINE A:   Diabetes Mellitus -No home antidiabetic meds reported   P:   Follow Glucose on BMP Follow ICU hypo/hyperglycemia protocol If pt with hyperglycemia, consider placing on SSI  NEUROLOGIC A:   No active issues  P:   Provide supportive care Lights on during the day Avoid sedating meds as able Promote normal wake/sleep cycle Once respiratory status improves, mobilize as able   FAMILY  - Updates: Updated pt at bedside 05/29/18.  Spoke with pt regarding Code status, she confirms that she is a DNR/DNI.  - Inter-disciplinary family meet or Palliative Care meeting due by:  06/05/18   Darel Hong, AGACNP-BC [facility] Pager: [phone]   05/29/2018, 3:48 AM

## 2018-05-29 NOTE — Progress Notes (Signed)
Pt not on ICU at this time,RN will call me back when pt is ready for HD tx.    05/29/18 0636  Hand-Off documentation  Report given to (Full Name) Stark Bray  Report received from (Full Name) Josh,RN

## 2018-05-29 NOTE — Progress Notes (Signed)
[facility]  ROUNDING NOTE   Subjective:  She well-known to [place]. She presents with shortness of breath, pulmonary edema, and hyperkalemia. She missed her dialysis treatment on Thursday. Patient seen and evaluated during dialysis treatment at the moment. Still on BiPAP.   Objective:  Vital signs in last 24 hours:  Temp:  [98.4 F (36.9 C)] 98.4 F (36.9 C) (09/10 0720) Pulse Rate:  [52-88] 88 (09/10 1030) Resp:  [18-26] 21 (09/10 1030) BP: (95-166)/(49-137) 116/91 (09/10 1030) SpO2:  [84 %-100 %] 99 % (09/10 1030) Weight:  [102.1 kg-104 kg] 104 kg (09/10 0720)  Weight change:  Filed Weights   05/29/18 0225 05/29/18 0645 05/29/18 0720  Weight: 102.1 kg 102.5 kg 104 kg    Intake/Output: I/O last 3 completed shifts: In: 109.7 [IV Piggyback:109.7] Out: -    Intake/Output this shift:  No intake/output data recorded.  Physical Exam: General: Mild respiratory distress  Head: Normocephalic, atraumatic. Bipap facemask on  Eyes: Anicteric  Neck: Supple, trachea midline  Lungs:  Bilateral rales, on bipap  Heart: S1S2 no rubs  Abdomen:  Soft, nontender, bowel sounds present  Extremities: trace peripheral edema.  Neurologic: Awake, alert, following commands  Skin: No lesions  Access:  LUE AVF    Basic Metabolic Panel: Recent Labs  Lab 05/29/18 0228 05/29/18 0750 05/29/18 0751  NA 134* 132*  --   K 6.9* >7.5*  --   CL 100 99  --   CO2 20* 18*  --   GLUCOSE 112* 113*  --   BUN 76* 77*  --   CREATININE 11.03* 11.19*  --   CALCIUM 8.3* 8.1*  --   MG 2.3  --  2.4  PHOS  --  9.7*  --     Liver Function Tests: Recent Labs  Lab 05/29/18 0228  AST 15  ALT 9  ALKPHOS 59  BILITOT 0.9  PROT 8.3*  ALBUMIN 3.8   Recent Labs  Lab 05/29/18 0228  LIPASE 19   No results for input(s): AMMONIA in the last 168 hours.  CBC: Recent Labs  Lab 05/29/18 0228 05/29/18 0750  WBC 8.3 7.4  NEUTROABS 5.8  --   HGB 9.3* 9.1*  HCT 28.3* 27.3*  MCV 92.4  92.0  PLT 204 196    Cardiac Enzymes: Recent Labs  Lab 05/29/18 0228  TROPONINI <0.03    BNP: Invalid input(s): POCBNP  CBG: Recent Labs  Lab 05/29/18 0649  GLUCAP 111*    Microbiology: Results for orders placed or performed during the hospital encounter of 05/29/18  MRSA PCR Screening     Status: None   Collection Time: 05/29/18  6:51 AM  Result Value Ref Range Status   MRSA by PCR NEGATIVE NEGATIVE Final    Comment:        The GeneXpert MRSA Assay (FDA approved for NASAL specimens only), is one component of a comprehensive MRSA colonization surveillance program. It is not intended to diagnose MRSA infection nor to guide or monitor treatment for MRSA infections. Performed at [facility], [address] [place], [place] [address]     Coagulation Studies: No results for input(s): LABPROT, INR in the last 72 hours.  Urinalysis: No results for input(s): COLORURINE, LABSPEC, PHURINE, GLUCOSEU, HGBUR, BILIRUBINUR, KETONESUR, PROTEINUR, UROBILINOGEN, NITRITE, LEUKOCYTESUR in the last 72 hours.  Invalid input(s): APPERANCEUR    Imaging: Dg Chest Port 1 View  Result Date: 05/29/2018 CLINICAL DATA:  Respiratory distress.  Missed dialysis 2 days ago. EXAM: PORTABLE  CHEST 1 VIEW COMPARISON:  04/18/2018, chest CT 12/17/2015 FINDINGS: Unchanged cardiomegaly and mediastinal contours. Moderate pulmonary edema, increased from prior exam. No confluent airspace disease. No large pleural effusion or pneumothorax. 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 IMPRESSION: Moderate pulmonary edema with unchanged cardiomegaly. Electronically Signed   By: Keith Rake M.D.   On: 05/29/2018 03:00     Medications:   . sodium chloride    . sodium chloride     . amLODipine  10 mg Oral Daily  . aspirin  81 mg Oral Daily  . atorvastatin  40 mg Oral QHS  . calcium carbonate  2 tablet Oral TID  .  Chlorhexidine Gluconate Cloth  6 each Topical Q0600  . cinacalcet  30 mg Oral QAC breakfast  . clopidogrel  75 mg Oral Daily  . docusate sodium  100 mg Oral BID  . doxycycline  100 mg Oral BID  . feeding supplement (NEPRO CARB STEADY)  237 mL Oral Q24H  . fluticasone  2 spray Each Nare BID  . fluticasone furoate-vilanterol  1 puff Inhalation Daily  . folic acid  1 mg Oral Daily  . heparin  5,000 Units Subcutaneous Q8H  . lidocaine  1 patch Transdermal Q24H  . loratadine  10 mg Oral Daily  . lubiprostone  24 mcg Oral BID WC  . metoprolol succinate  100 mg Oral Q breakfast  . multivitamin  1 tablet Oral QHS  . nystatin  5 mL Mouth/Throat QID  . pantoprazole  40 mg Oral BID  . polyethylene glycol  17 g Oral Daily  . ranolazine  500 mg Oral BID  . sevelamer carbonate  2.4 g Oral BID WC  . sodium zirconium cyclosilicate  10 g Oral TID  . tiotropium  1 capsule Inhalation Daily  . torsemide  100 mg Oral Daily  . varenicline  0.5 mg Oral Daily  . venlafaxine XR  37.5 mg Oral Q breakfast   sodium chloride, sodium chloride, acetaminophen **OR** acetaminophen, albuterol, alteplase, heparin, lidocaine (PF), lidocaine-prilocaine, meclizine, nitroGLYCERIN, ondansetron **OR** ondansetron (ZOFRAN) IV, oxyCODONE, pentafluoroprop-tetrafluoroeth  Assessment/ Plan:  67 y.o. female with end stage renal disease on hemodialysis, coronary artery disease status post CABG, hypertension, peripheral vascular disease, diabetes mellitus type 2, hyperlipidemia, bilateral total knee replacement, PD catheter removal June 30, 2017, s/p PCI mLAD 03/2018  [facility]TTS/left AVF/100.5 kg  1.  ESRD on HD TTS. 2.  Anemia of chronic kidney disease. 3.  Secondary hyperparathyroidism. 4.  Acute respiratory failure secondary to pulmonary edema. 5.  Hyperkalemia.  Plan: Patient came in with pulmonary edema, shortness of breath, and severe hyperkalemia.  Initial serum potassium was 6.9 but then was over  7.5.  She has been started on sodium zirconium.  In addition she is being actively dialyzed at the moment.  Repeat serum potassium later in the day to make sure potassium coming down.  We counseled the patient on the dangers of missing dialysis treatments.  She verbalized understanding of this.  Continue BiPAP for now until additional ultrafiltration performed.  Thanks for consultation.    LOS: 0 Jacque Garrels 9/10/201910:47 AM

## 2018-05-29 NOTE — H&P (Signed)
Kimberly Shaffer is an 67 y.o. female.   Chief Complaint: Shortness of breath HPI: The patient with past medical history of end-stage renal disease, diabetes, COPD, CAD and CHF presents to the emergency department with dyspnea.  Patient missed dialysis and has rapidly become more dyspneic.  She is well-known to the hospitalist service for the same complaints.  He denies chest pain at this time.  Patient was started on BiPAP which improved her work of breathing and oxygenation.  The emergency department staff called the hospitalist service for dialysis.  Past Medical History:  Diagnosis Date  . Asthma   . CAD (coronary artery disease)   . Chronic lower back pain   . COPD (chronic obstructive pulmonary disease) ([place])   . Depression   . Diabetes mellitus without complication (HCC)    NIDD  . H/O angina pectoris   . H/O blood clots   . Hypercholesteremia   . Hypertension   . Left rotator cuff tear   . Myocardial infarction ([place])   . Obesity   . Renal insufficiency   . Stroke ([place])   . Vertigo, aural     Past Surgical History:  Procedure Laterality Date  . A/V FISTULAGRAM Left 03/14/2017   Procedure: A/V Fistulagram;  Surgeon: Katha Cabal, MD;  Location: [place] [facility];  Service: Cardiovascular;  Laterality: Left;  . AV FISTULA PLACEMENT Left 11-04-2015  . AV FISTULA PLACEMENT Left 11/04/2015   Procedure: ARTERIOVENOUS (AV) FISTULA CREATION  ( BRACHIAL CEPHALIC );  Surgeon: Katha Cabal, MD;  Location: [facility];  Service: Vascular;  Laterality: Left;  . CARDIAC CATHETERIZATION    . CARDIAC CATHETERIZATION Left 07/19/2016   Procedure: Left Heart Cath and Coronary Angiography;  Surgeon: Corey Skains, MD;  Location: [place] [facility];  Service: Cardiovascular;  Laterality: Left;  . CORONARY ARTERY BYPASS GRAFT  2012  . CORONARY STENT PLACEMENT    . JOINT REPLACEMENT    . KNEE SURGERY Bilateral   . LEFT HEART CATH AND CORONARY ANGIOGRAPHY N/A 12/25/2017   Procedure:  LEFT HEART CATH AND CORONARY ANGIOGRAPHY;  Surgeon: Teodoro Spray, MD;  Location: [place] [facility];  Service: Cardiovascular;  Laterality: N/A;  . LEFT HEART CATH AND CORONARY ANGIOGRAPHY Right 03/23/2018   Procedure: LEFT HEART CATH AND CORONARY ANGIOGRAPHY;  Surgeon: Dionisio David, MD;  Location: [place] [facility];  Service: Cardiovascular;  Laterality: Right;  . PERIPHERAL VASCULAR CATHETERIZATION N/A 11/13/2015   Procedure: Dialysis/Perma Catheter Insertion;  Surgeon: Katha Cabal, MD;  Location: [place] [facility];  Service: Cardiovascular;  Laterality: N/A;  . PERIPHERAL VASCULAR CATHETERIZATION N/A 12/04/2015   Procedure: Dialysis/Perma Catheter Insertion;  Surgeon: Katha Cabal, MD;  Location: [place] [facility];  Service: Cardiovascular;  Laterality: N/A;  . PERIPHERAL VASCULAR CATHETERIZATION Left 12/31/2015   Procedure: A/V Shuntogram/Fistulagram;  Surgeon: Algernon Huxley, MD;  Location: [place] [facility];  Service: Cardiovascular;  Laterality: Left;  . PERIPHERAL VASCULAR CATHETERIZATION N/A 12/31/2015   Procedure: A/V Shunt Intervention;  Surgeon: Algernon Huxley, MD;  Location: [place] [facility];  Service: Cardiovascular;  Laterality: N/A;  . PERIPHERAL VASCULAR CATHETERIZATION Left 02/25/2016   Procedure: A/V Shuntogram/Fistulagram;  Surgeon: Algernon Huxley, MD;  Location: [place] [facility];  Service: Cardiovascular;  Laterality: Left;  . PERIPHERAL VASCULAR CATHETERIZATION N/A 03/18/2016   Procedure: Dialysis/Perma Catheter Removal;  Surgeon: Katha Cabal, MD;  Location: [place] [facility];  Service: Cardiovascular;  Laterality: N/A;  .  REMOVAL OF A DIALYSIS CATHETER N/A 06/30/2017   Procedure: REMOVAL OF A DIALYSIS CATHETER;  Surgeon: Katha Cabal, MD;  Location: [facility];  Service: Vascular;  Laterality: N/A;  . REPLACEMENT TOTAL KNEE BILATERAL Bilateral     Family History  Problem Relation Age of Onset  . Cancer Mother   . Cancer Father   .  Diabetes Brother   . Heart disease Brother    Social History:  reports that she has been smoking cigarettes. She has been smoking about 0.25 packs per day. She has never used smokeless tobacco. She reports that she does not drink alcohol or use drugs.  Allergies:  Allergies  Allergen Reactions  . Nystatin Hives and Itching  . Sulfa Antibiotics Swelling, Hives and Rash  . Niaspan [Niacin] Other (See Comments) and Hives    Medications Prior to Admission  Medication Sig Dispense Refill  . acetaminophen (TYLENOL) 500 MG tablet Take 1,000 mg by mouth every 8 (eight) hours as needed for mild pain.    Marland Kitchen albuterol (PROVENTIL HFA;VENTOLIN HFA) 108 (90 BASE) MCG/ACT inhaler Inhale 2 puffs into the lungs every 6 (six) hours as needed for wheezing or shortness of breath.     Marland Kitchen amLODipine (NORVASC) 10 MG tablet Take 1 tablet (10 mg total) by mouth daily.    Marland Kitchen aspirin 81 MG chewable tablet Chew 81 mg by mouth daily.    Marland Kitchen atorvastatin (LIPITOR) 40 MG tablet Take 40 mg by mouth at bedtime.  3  . budesonide-formoterol (SYMBICORT) 160-4.5 MCG/ACT inhaler Inhale 2 puffs into the lungs 2 (two) times daily.    . calcium carbonate (TUMS - DOSED IN MG ELEMENTAL CALCIUM) 500 MG chewable tablet Chew 2 tablets by mouth 3 (three) times daily.    . cetirizine (ZYRTEC) 10 MG tablet Take 10 mg by mouth daily.    . clopidogrel (PLAVIX) 75 MG tablet Take 75 mg by mouth daily.    Marland Kitchen doxycycline (VIBRA-TABS) 100 MG tablet Take 100 mg by mouth 2 (two) times daily.    . fluticasone (FLONASE) 50 MCG/ACT nasal spray Place 2 sprays into both nostrils 2 (two) times daily.    . folic acid (FOLVITE) 1 MG tablet Take 1 mg by mouth daily.    Marland Kitchen lidocaine (LIDODERM) 5 % Place 1 patch onto the skin daily. Remove & Discard patch within 12 hours or as directed by MD    . lubiprostone (AMITIZA) 24 MCG capsule Take 24 mcg by mouth 2 (two) times daily with a meal.    . meclizine (ANTIVERT) 25 MG tablet Take 25 mg by mouth 3 (three) times  daily as needed for dizziness.    . metoprolol succinate (TOPROL-XL) 100 MG 24 hr tablet Take 1 tablet (100 mg total) by mouth daily. Take with or immediately following a meal.    . nystatin (MYCOSTATIN) 100000 UNIT/ML suspension Use as directed 5 mLs in the mouth or throat 4 (four) times daily.    Marland Kitchen oxyCODONE (OXY IR/ROXICODONE) 5 MG immediate release tablet Take 5 mg by mouth every 6 (six) hours as needed (pain).     . pantoprazole (PROTONIX) 40 MG tablet Take 40 mg by mouth 2 (two) times daily.    . polyethylene glycol (MIRALAX / GLYCOLAX) packet Take 17 g by mouth daily.    . ranolazine (RANEXA) 500 MG 12 hr tablet Take 500 mg by mouth 2 (two) times daily.    Marland Kitchen RENVELA 2.4 g PACK Take 2.4 g by mouth 2 (two) times  daily. 30 each 0  . SENSIPAR 30 MG tablet Take 1 tablet (30 mg total) by mouth daily. 60 tablet 0  . sodium chloride HYPERTONIC 3 % nebulizer solution Take 3 mLs by nebulization as needed for other.     Marland Kitchen SPIRIVA HANDIHALER 18 MCG inhalation capsule Place 1 capsule into inhaler and inhale daily.    . varenicline (CHANTIX) 0.5 MG tablet Take 0.5 mg by mouth daily.    Marland Kitchen venlafaxine XR (EFFEXOR-XR) 37.5 MG 24 hr capsule Take 37.5 mg by mouth daily with breakfast.    . multivitamin (RENA-VIT) TABS tablet Take 1 tablet by mouth at bedtime.  0  . nitroGLYCERIN (NITROLINGUAL) 0.4 MG/SPRAY spray Place 2 sprays under the tongue every 5 (five) minutes x 3 doses as needed for chest pain.     . Nutritional Supplements (FEEDING SUPPLEMENT, NEPRO CARB STEADY,) LIQD Take 237 mLs by mouth daily. 15 Can 0  . torsemide (DEMADEX) 100 MG tablet Take 1 tablet (100 mg total) by mouth daily. 30 tablet 0    Results for orders placed or performed during the hospital encounter of 05/29/18 (from the past 48 hour(s))  Magnesium     Status: None   Collection Time: 05/29/18  2:28 AM  Result Value Ref Range   Magnesium 2.3 1.7 - 2.4 mg/dL    Comment: Performed at [facility], [address]  [place], [place] [address]  Comprehensive metabolic panel     Status: Abnormal   Collection Time: 05/29/18  2:28 AM  Result Value Ref Range   Sodium 134 (L) 135 - 145 mmol/L   Potassium 6.9 (HH) 3.5 - 5.1 mmol/L    Comment: CRITICAL RESULT CALLED TO, READ BACK BY AND VERIFIED WITH RACQUEL DAVID ON 05/29/18 AT 0313 JAG    Chloride 100 98 - 111 mmol/L   CO2 20 (L) 22 - 32 mmol/L   Glucose, Bld 112 (H) 70 - 99 mg/dL   BUN 76 (H) 8 - 23 mg/dL   Creatinine, Ser 11.03 (H) 0.44 - 1.00 mg/dL   Calcium 8.3 (L) 8.9 - 10.3 mg/dL   Total Protein 8.3 (H) 6.5 - 8.1 g/dL   Albumin 3.8 3.5 - 5.0 g/dL   AST 15 15 - 41 U/L   ALT 9 0 - 44 U/L   Alkaline Phosphatase 59 38 - 126 U/L   Total Bilirubin 0.9 0.3 - 1.2 mg/dL   GFR calc non Af Amer 3 (L) >60 mL/min   GFR calc Af Amer 4 (L) >60 mL/min    Comment: (NOTE) The eGFR has been calculated using the CKD EPI equation. This calculation has not been validated in all clinical situations. eGFR's persistently <60 mL/min signify possible Chronic Kidney Disease.    Anion gap 14 5 - 15    Comment: Performed at [facility], [address] [place], [place] [address]  Lipase, blood     Status: None   Collection Time: 05/29/18  2:28 AM  Result Value Ref Range   Lipase 19 11 - 51 U/L    Comment: Performed at [facility], [address] [place], [place] [address]  Troponin I     Status: None   Collection Time: 05/29/18  2:28 AM  Result Value Ref Range   Troponin I <0.03 <0.03 ng/mL    Comment: Performed at [facility], [address] [place], [place] [address]  Brain natriuretic peptide     Status: Abnormal   Collection Time: 05/29/18  2:28 AM  Result Value Ref Range   B Natriuretic Peptide 1,851.0 (H) 0.0 - 100.0 pg/mL    Comment: Performed at [facility], [address] [place], [place] [address]  CBC with Differential     Status: Abnormal   Collection Time: 05/29/18  2:28 AM  Result Value Ref Range   WBC  8.3 3.6 - 11.0 K/uL   RBC 3.06 (L) 3.80 - 5.20 MIL/uL   Hemoglobin 9.3 (L) 12.0 - 16.0 g/dL   HCT 28.3 (L) 35.0 - 47.0 %   MCV 92.4 80.0 - 100.0 fL   MCH 30.5 26.0 - 34.0 pg   MCHC 33.0 32.0 - 36.0 g/dL   RDW 19.2 (H) 11.5 - 14.5 %   Platelets 204 150 - 440 K/uL   Neutrophils Relative % 69 %   Neutro Abs 5.8 1.4 - 6.5 K/uL   Lymphocytes Relative 19 %   Lymphs Abs 1.6 1.0 - 3.6 K/uL   Monocytes Relative 10 %   Monocytes Absolute 0.8 0.2 - 0.9 K/uL   Eosinophils Relative 1 %   Eosinophils Absolute 0.1 0 - 0.7 K/uL   Basophils Relative 1 %   Basophils Absolute 0.1 0 - 0.1 K/uL    Comment: Performed at [facility], [address] [place], [place] [address]  Glucose, capillary     Status: Abnormal   Collection Time: 05/29/18  6:49 AM  Result Value Ref Range   Glucose-Capillary 111 (H) 70 - 99 mg/dL   Dg Chest Port 1 View  Result Date: 05/29/2018 CLINICAL DATA:  Respiratory distress.  Missed dialysis 2 days ago. EXAM: PORTABLE CHEST 1 VIEW COMPARISON:  04/18/2018, chest CT 12/17/2015 FINDINGS: Unchanged cardiomegaly and mediastinal contours. Moderate pulmonary edema, increased from prior exam. No confluent airspace disease. No large pleural effusion or pneumothorax. 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 IMPRESSION: Moderate pulmonary edema with unchanged cardiomegaly. Electronically Signed   By: Keith Rake M.D.   On: 05/29/2018 03:00    Review of Systems  Constitutional: Negative for chills and fever.  HENT: Negative for sore throat and tinnitus.   Eyes: Negative for blurred vision and redness.  Respiratory: Positive for shortness of breath. Negative for cough.   Cardiovascular: Negative for chest pain, palpitations, orthopnea and PND.  Gastrointestinal: Negative for abdominal pain, diarrhea, nausea and vomiting.  Genitourinary: Negative for dysuria, frequency and urgency.  Musculoskeletal:  Negative for joint pain and myalgias.  Skin: Negative for rash.       No lesions  Neurological: Negative for speech change, focal weakness and weakness.  Endo/Heme/Allergies: Does not bruise/bleed easily.       No temperature intolerance  Psychiatric/Behavioral: Negative for depression and suicidal ideas.    Blood pressure (!) 144/86, pulse 61, temperature 98.4 F (36.9 C), temperature source Axillary, resp. rate 18, height _0  (1.676 m), weight 102.5 kg, SpO2 91 %. Physical Exam  Vitals reviewed. Constitutional: She is oriented to person, place, and time. She appears well-developed and well-nourished. She appears distressed. Face mask in place.  HENT:  Head: Normocephalic and atraumatic.  Mouth/Throat: Oropharynx is clear and moist.  Eyes: Pupils are equal, round, and reactive to light. Conjunctivae and EOM are normal. No scleral icterus.  Neck: Normal range of motion. Neck supple. No JVD present. No tracheal deviation present. No thyromegaly present.  Cardiovascular: Normal rate, regular rhythm and normal heart sounds. Exam reveals no gallop and no friction rub.  No murmur heard. Respiratory: Breath sounds normal. She is in respiratory distress.  On BiPAP  GI: Soft. Bowel sounds are normal. She exhibits no distension. There is no tenderness.  Genitourinary:  Genitourinary Comments: Deferred  Musculoskeletal: Normal range of motion. She exhibits no edema.  Lymphadenopathy:    She has no cervical adenopathy.  Neurological: She is alert and oriented to person, place, and time. No cranial nerve deficit. She exhibits normal muscle tone.  Skin: Skin is warm and dry. No rash noted. No erythema.  Psychiatric: She has a normal mood and affect. Her behavior is normal. Judgment and thought content normal.     Assessment/Plan This is a 67 year old female admitted for respiratory failure. 1.  Respiratory failure: Acute; with hypoxia.  Secondary to fluid overload.  The patient has missed  dialysis.  We will try to place her on an early run to decreased volume.  Continue BiPAP for now. 2.  CHF: Acute on chronic; systolic.  May improve with dialysis.  Reportedly, the patient makes some urine.  Resume torsemide.  Continue metoprolol 3.  ESRD: On hemodialysis.  Consult nephrology for continuation of dialysis.  Continue Renvela and Sensipar 4.  Diabetes mellitus type 2: Last A1c 2 months ago 5.6; continue diet control. 5.  CAD: Stable; continue aspirin, Plavix and Ranexa 6.  COPD: Continue Spiriva and inhaled corticosteroid and long-acting bronchial agonist 7.  DVT prophylaxis: Heparin 8.  GI prophylaxis: Pantoprazole per home regimen The patient is a DNR.  Time spent on admission orders and patient care approximately 45 minutes  Harrie Foreman, MD 05/29/2018, 7:14 AM

## 2018-05-29 NOTE — ED Notes (Signed)
Report called to Vonna Kotyk RN, pt admitted to ccu via stretcher

## 2018-05-29 NOTE — ED Provider Notes (Signed)
[facility] Emergency Department Provider Note  ____________________________________________   First MD Initiated Contact with Patient 05/29/18 [phone]     (approximate)  I have reviewed the triage vital signs and the nursing notes.   HISTORY  Chief Complaint Respiratory Distress  Level 5 caveat:  history/ROS limited by acute/critical illness  HPI Kimberly BUSSIE is a 67 y.o. female with extensive chronic medical history including end-stage renal disease on hemodialysis Tuesdays, Thursdays, and Saturdays as well as COPD.  She presents by EMS in severe respiratory distress.  She reports that she has been getting worse over the last few days.  She missed dialysis 2 days ago because she had to take care of her grandchildren.  By tonight her breathing was severe and she [place].  She was extremely ill-appearing and in severe distress upon arrival so the immediately put her on a nonrebreather gave her 1 DuoNeb will bring her to the emergency department.  She reports feeling no better although she is speaking a bit more clearly than she was previously.  She denies any chest pain.  She denies recent fever/chills.  She has no vomiting or abdominal pain.  She is not able to provide any additional history.  Past Medical History:  Diagnosis Date  . Asthma   . CAD (coronary artery disease)   . Chronic lower back pain   . COPD (chronic obstructive pulmonary disease) ([place])   . Depression   . Diabetes mellitus without complication (HCC)    NIDD  . H/O angina pectoris   . H/O blood clots   . Hypercholesteremia   . Hypertension   . Left rotator cuff tear   . Myocardial infarction ([place])   . Obesity   . Renal insufficiency   . Stroke ([place])   . Vertigo, aural     Patient Active Problem List   Diagnosis Date Noted  . Acute respiratory failure with hypoxemia ([place]) 04/18/2018  . Anemia of chronic disease 03/24/2018  . NSTEMI (non-ST  elevated myocardial infarction) ([place]) 03/23/2018  . Acute on chronic diastolic CHF (congestive heart failure) ([place]) 03/02/2018  . GERD (gastroesophageal reflux disease) 03/02/2018  . Respiratory failure ([place]) 01/11/2018  . Abdominal pain 06/28/2017  . Anxiety, generalized 06/27/2017  . COPD (chronic obstructive pulmonary disease) ([place]) 06/26/2017  . Peritonitis ([place]) 05/28/2017  . Bilateral carotid artery stenosis 03/08/2017  . ESRD on dialysis ([place]) 03/08/2017  . Non-ST elevation myocardial infarction (NSTEMI), subendocardial infarction, subsequent episode of care ([place]) 11/04/2016  . Onychogryphosis 10/31/2016  . Onychomycosis 10/31/2016  . Subungual exostosis 10/31/2016  . Type 2 diabetes mellitus with diabetic neuropathy ([place]) 10/31/2016  . Chronic osteomyelitis of left hand ([place]) 09/03/2016  . Left arm swelling 08/30/2016  . Pre-operative clearance 08/30/2016  . Coronary artery disease of native artery of native heart with stable angina pectoris ([place]) 08/03/2016  . Benign paroxysmal positional vertigo due to bilateral vestibular disorder 07/26/2016  . H/O: CVA (cerebrovascular accident) 07/26/2016  . Stable angina ([place]) 07/05/2016  . Sepsis due to pneumonia ([place]) 07/05/2016  . Fracture of left foot 04/26/2016  . Pre-transplant evaluation for kidney transplant 02/04/2016  . Chest pain at rest 12/17/2015  . Chest pain 12/17/2015  . DJD of shoulder 10/21/2015  . Perianal lesion 05/20/2015  . 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 Lumbar radiculopathy 05/14/2015  . 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 Benign essential hypertension 04/22/2015  . Grief 03/18/2015  . Lumbosacral  facet joint syndrome 02/16/2015  . Greater trochanteric bursitis 02/16/2015  . DDD (degenerative disc disease), lumbosacral 01/20/2015  . 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 Osteoarthritis 01/20/2015  . IDA (iron deficiency anemia) 03/25/2014  . Thyroid  nodule 01/15/2014  . Lung nodule 08/28/2013  . Tobacco abuse 05/17/2013  . Mixed hyperlipidemia 06/14/2011  . OSA on CPAP 06/14/2011  . PAD (peripheral artery disease) ([place]) 06/14/2011  . Depression 05/05/2011  . Chronic kidney disease (CKD), stage V ([place]) 03/26/2011  . Multiple vessel coronary artery disease 03/26/2011  . Obesity, unspecified 03/26/2011  . Type 2 diabetes mellitus ([place]) 03/26/2011    Past Surgical History:  Procedure Laterality Date  . A/V FISTULAGRAM Left 03/14/2017   Procedure: A/V Fistulagram;  Surgeon: Katha Cabal, MD;  Location: [place] [facility];  Service: Cardiovascular;  Laterality: Left;  . AV FISTULA PLACEMENT Left 11-04-2015  . AV FISTULA PLACEMENT Left 11/04/2015   Procedure: ARTERIOVENOUS (AV) FISTULA CREATION  ( BRACHIAL CEPHALIC );  Surgeon: Katha Cabal, MD;  Location: [facility];  Service: Vascular;  Laterality: Left;  . CARDIAC CATHETERIZATION    . CARDIAC CATHETERIZATION Left 07/19/2016   Procedure: Left Heart Cath and Coronary Angiography;  Surgeon: Corey Skains, MD;  Location: [place] [facility];  Service: Cardiovascular;  Laterality: Left;  . CORONARY ARTERY BYPASS GRAFT  2012  . CORONARY STENT PLACEMENT    . JOINT REPLACEMENT    . KNEE SURGERY Bilateral   . LEFT HEART CATH AND CORONARY ANGIOGRAPHY N/A 12/25/2017   Procedure: LEFT HEART CATH AND CORONARY ANGIOGRAPHY;  Surgeon: Teodoro Spray, MD;  Location: [place] [facility];  Service: Cardiovascular;  Laterality: N/A;  . LEFT HEART CATH AND CORONARY ANGIOGRAPHY Right 03/23/2018   Procedure: LEFT HEART CATH AND CORONARY ANGIOGRAPHY;  Surgeon: Dionisio David, MD;  Location: [place] [facility];  Service: Cardiovascular;  Laterality: Right;  . PERIPHERAL VASCULAR CATHETERIZATION N/A 11/13/2015   Procedure: Dialysis/Perma Catheter Insertion;  Surgeon: Katha Cabal, MD;  Location: [place] [facility];  Service: Cardiovascular;  Laterality: N/A;  . PERIPHERAL VASCULAR  CATHETERIZATION N/A 12/04/2015   Procedure: Dialysis/Perma Catheter Insertion;  Surgeon: Katha Cabal, MD;  Location: [place] [facility];  Service: Cardiovascular;  Laterality: N/A;  . PERIPHERAL VASCULAR CATHETERIZATION Left 12/31/2015   Procedure: A/V Shuntogram/Fistulagram;  Surgeon: Algernon Huxley, MD;  Location: [place] [facility];  Service: Cardiovascular;  Laterality: Left;  . PERIPHERAL VASCULAR CATHETERIZATION N/A 12/31/2015   Procedure: A/V Shunt Intervention;  Surgeon: Algernon Huxley, MD;  Location: [place] [facility];  Service: Cardiovascular;  Laterality: N/A;  . PERIPHERAL VASCULAR CATHETERIZATION Left 02/25/2016   Procedure: A/V Shuntogram/Fistulagram;  Surgeon: Algernon Huxley, MD;  Location: [place] [facility];  Service: Cardiovascular;  Laterality: Left;  . PERIPHERAL VASCULAR CATHETERIZATION N/A 03/18/2016   Procedure: Dialysis/Perma Catheter Removal;  Surgeon: Katha Cabal, MD;  Location: [place] [facility];  Service: Cardiovascular;  Laterality: N/A;  . REMOVAL OF A DIALYSIS CATHETER N/A 06/30/2017   Procedure: REMOVAL OF A DIALYSIS CATHETER;  Surgeon: Katha Cabal, MD;  Location: [facility];  Service: Vascular;  Laterality: N/A;  . REPLACEMENT TOTAL KNEE BILATERAL Bilateral     Prior to Admission medications   Medication Sig Start Date End Date Taking? Authorizing Provider  acetaminophen (TYLENOL) 500 MG tablet Take 1,000 mg by mouth every 8 (eight) hours as needed for mild pain.   Yes [provider]  albuterol (PROVENTIL HFA;VENTOLIN HFA) 108 (90 BASE) MCG/ACT inhaler Inhale 2 puffs into the lungs every 6 (six) hours as needed for wheezing or shortness of breath.    Yes [provider]  amLODipine (NORVASC) 10 MG tablet Take 1 tablet (10 mg total) by mouth daily. 03/23/18  Yes Loletha Grayer, MD  aspirin 81 MG chewable tablet Chew 81 mg by mouth daily.   Yes [provider]  atorvastatin (LIPITOR) 40 MG tablet Take 40 mg by mouth at  bedtime. 03/28/18  Yes [provider]  budesonide-formoterol (SYMBICORT) 160-4.5 MCG/ACT inhaler Inhale 2 puffs into the lungs 2 (two) times daily.   Yes [provider]  calcium carbonate (TUMS - DOSED IN MG ELEMENTAL CALCIUM) 500 MG chewable tablet Chew 2 tablets by mouth 3 (three) times daily.   Yes [provider]  cetirizine (ZYRTEC) 10 MG tablet Take 10 mg by mouth daily.   Yes [provider]  clopidogrel (PLAVIX) 75 MG tablet Take 75 mg by mouth daily.   Yes [provider]  doxycycline (VIBRA-TABS) 100 MG tablet Take 100 mg by mouth 2 (two) times daily. 05/25/18 06/08/18 Yes [provider]  fluticasone (FLONASE) 50 MCG/ACT nasal spray Place 2 sprays into both nostrils 2 (two) times daily.   Yes [provider]  folic acid (FOLVITE) 1 MG tablet Take 1 mg by mouth daily.   Yes [provider]  lidocaine (LIDODERM) 5 % Place 1 patch onto the skin daily. Remove & Discard patch within 12 hours or as directed by MD   Yes [provider]  lubiprostone (AMITIZA) 24 MCG capsule Take 24 mcg by mouth 2 (two) times daily with a meal.   Yes [provider]  meclizine (ANTIVERT) 25 MG tablet Take 25 mg by mouth 3 (three) times daily as needed for dizziness.   Yes [provider]  metoprolol succinate (TOPROL-XL) 100 MG 24 hr tablet Take 1 tablet (100 mg total) by mouth daily. Take with or immediately following a meal. 03/23/18  Yes Wieting, Richard, MD  nystatin (MYCOSTATIN) 100000 UNIT/ML suspension Use as directed 5 mLs in the mouth or throat 4 (four) times daily.   Yes [provider]  oxyCODONE (OXY IR/ROXICODONE) 5 MG immediate release tablet Take 5 mg by mouth every 6 (six) hours as needed (pain).  12/28/17  Yes [provider]  pantoprazole (PROTONIX) 40 MG tablet Take 40 mg by mouth 2 (two) times daily.   Yes [provider]  polyethylene glycol (MIRALAX / GLYCOLAX) packet Take  17 g by mouth daily.   Yes [provider]  ranolazine (RANEXA) 500 MG 12 hr tablet Take 500 mg by mouth 2 (two) times daily. 04/13/18  Yes [provider]  RENVELA 2.4 g PACK Take 2.4 g by mouth 2 (two) times daily. 01/14/18  Yes Fritzi Mandes, MD  SENSIPAR 30 MG tablet Take 1 tablet (30 mg total) by mouth daily. 01/14/18  Yes Fritzi Mandes, MD  sodium chloride HYPERTONIC 3 % nebulizer solution Take 3 mLs by nebulization as needed for other.    Yes [provider]  SPIRIVA HANDIHALER 18 MCG inhalation capsule Place 1 capsule into inhaler and inhale daily.   Yes [provider]  varenicline (CHANTIX) 0.5 MG tablet Take 0.5 mg by mouth daily.   Yes [provider]  venlafaxine XR (EFFEXOR-XR) 37.5 MG 24 hr capsule Take 37.5 mg by mouth daily with breakfast.   Yes [provider]  multivitamin (RENA-VIT) TABS tablet Take 1 tablet by mouth at bedtime. 03/23/18   Loletha Grayer, MD  nitroGLYCERIN (NITROLINGUAL) 0.4 MG/SPRAY spray Place 2 sprays under the tongue every 5 (five) minutes x 3 doses as needed for chest pain.     [provider]  Nutritional Supplements (FEEDING SUPPLEMENT, NEPRO CARB STEADY,) LIQD Take 237 mLs by mouth daily. 02/27/18   Hillary Bow, MD  torsemide (DEMADEX) 100 MG tablet Take 1 tablet (100 mg total) by mouth daily. 03/21/18   Demetrios Loll, MD    Allergies Nystatin; Sulfa antibiotics; and Niaspan [niacin]  Family History  Problem Relation Age of Onset  . Cancer Mother   . Cancer Father   . Diabetes Brother   . Heart disease Brother     Social History Social History   Tobacco Use  . Smoking status: Current Every Day Smoker    Packs/day: 0.25    Types: Cigarettes  . Smokeless tobacco: Never Used  Substance Use Topics  . Alcohol use: No    Alcohol/week: 0.0 standard drinks  . Drug use: No    Review of Systems Level 5 caveat:  history/ROS limited by acute/critical  illness ____________________________________________   PHYSICAL EXAM:  VITAL SIGNS: ED Triage Vitals  Enc Vitals Group     BP 05/29/18 0234 (!) 124/97     Pulse Rate 05/29/18 0234 (!) 58     Resp 05/29/18 0234 (!) 26     Temp --      Temp src --      SpO2 05/29/18 0234 100 %     Weight 05/29/18 0225 102.1 kg (225 lb)     Height 05/29/18 0225 1.651 m (5\' 5" )     Head Circumference --      Peak Flow --      Pain Score 05/29/18 0225 3     Pain Loc --      Pain Edu? --      Excl. in [place]? --     Constitutional: Alert and oriented.  Severe respiratory distress Eyes: Conjunctivae are normal.  Head: Atraumatic. Nose: No congestion/rhinnorhea. Mouth/Throat: Mucous membranes are moist. Neck: No stridor.  No meningeal signs.   Cardiovascular: Normal rate, regular rhythm. Good peripheral circulation. Grossly normal heart sounds. Respiratory: Increased respiratory effort with intercostal retractions and accessory muscle usage.  Coarse breath sounds in the bases.  No wheezing and good air movement throughout. Gastrointestinal: Obese.  Soft and nontender. No distention.  Musculoskeletal: No lower extremity tenderness nor edema. No gross deformities of extremities. Neurologic:  Normal speech and language. No gross focal neurologic deficits are appreciated.  Skin:  Skin is warm, dry and intact. No rash noted. Psychiatric: Mood and affect are normal. Speech and behavior are normal.  ____________________________________________   LABS (all labs ordered are listed, but only abnormal results are displayed)  Labs Reviewed  COMPREHENSIVE METABOLIC PANEL - Abnormal; Notable for the following components:      Result Value   Sodium 134 (*)    Potassium 6.9 (*)    CO2 20 (*)    Glucose, Bld 112 (*)    BUN 76 (*)    Creatinine, Ser 11.03 (*)    Calcium 8.3 (*)    Total Protein 8.3 (*)    GFR calc non Af Amer 3 (*)    GFR calc Af Amer 4 (*)    All other components within normal limits   BRAIN NATRIURETIC PEPTIDE - Abnormal; Notable for the following components:  B Natriuretic Peptide 1,851.0 (*)    All other components within normal limits  CBC WITH DIFFERENTIAL/PLATELET - Abnormal; Notable for the following components:   RBC 3.06 (*)    Hemoglobin 9.3 (*)    HCT 28.3 (*)    RDW 19.2 (*)    All other components within normal limits  CBC - Abnormal; Notable for the following components:   RBC 2.97 (*)    Hemoglobin 9.1 (*)    HCT 27.3 (*)    RDW 18.8 (*)    All other components within normal limits  GLUCOSE, CAPILLARY - Abnormal; Notable for the following components:   Glucose-Capillary 111 (*)    All other components within normal limits  MRSA PCR SCREENING  MAGNESIUM  LIPASE, BLOOD  TROPONIN I  BASIC METABOLIC PANEL  TSH  PHOSPHORUS  PARATHYROID HORMONE, INTACT (NO CA)  MAGNESIUM   ____________________________________________  EKG  ED ECG REPORT I, Hinda Kehr, the attending physician, personally viewed and interpreted this ECG.  Date: 05/29/2018 EKG Time: 2:32 Rate: 58 Rhythm: sinus rhythm QRS Axis: normal Intervals: normal ST/T Wave abnormalities: Chronic T wave inversions in the lateral leads as well is V2 with some ST depression and new T wave inversions in lead V2.  The only significant change from prior EKG is the inversion in V2. Narrative Interpretation: no definitive evidence of acute ischemia   ____________________________________________  RADIOLOGY I, Hinda Kehr, personally viewed and evaluated these images (plain radiographs) as part of my medical decision making, as well as reviewing the written report by the radiologist.  ED MD interpretation: Pulmonary edema  Official radiology report(s): Dg Chest Port 1 View  Result Date: 05/29/2018 CLINICAL DATA:  Respiratory distress.  Missed dialysis 2 days ago. EXAM: PORTABLE CHEST 1 VIEW COMPARISON:  04/18/2018, chest CT 12/17/2015 FINDINGS: Unchanged cardiomegaly and mediastinal  contours. Moderate pulmonary edema, increased from prior exam. No confluent airspace disease. No large pleural effusion or pneumothorax. 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 IMPRESSION: Moderate pulmonary edema with unchanged cardiomegaly. Electronically Signed   By: Keith Rake M.D.   On: 05/29/2018 03:00    ____________________________________________   PROCEDURES  Critical Care performed: Yes, see critical care procedure note(s)   Procedure(s) performed:   .Critical Care Performed by: Hinda Kehr, MD Authorized by: Hinda Kehr, MD   Critical care provider statement:    Critical care time (minutes):  45   Critical care time was exclusive of:  Separately billable procedures and treating other patients   Critical care was necessary to treat or prevent imminent or life-threatening deterioration of the following conditions:  Respiratory failure, renal failure and metabolic crisis   Critical care was time spent personally by me on the following activities:  Development of treatment plan with patient or surrogate, discussions with consultants, evaluation of patient's response to treatment, examination of patient, obtaining history from patient or surrogate, ordering and performing treatments and interventions, ordering and review of laboratory studies, ordering and review of radiographic studies, pulse oximetry, re-evaluation of patient's condition and review of old charts     ____________________________________________   INITIAL IMPRESSION / ASSESSMENT AND PLAN / ED COURSE  As part of my medical decision making, I reviewed the following data within the [place] notes reviewed and incorporated, Labs reviewed , EKG interpreted , Old EKG reviewed, Old chart reviewed, Radiograph reviewed , Discussed with admitting physician , discussed case with nephrology (Dr. Holley Raring) and reviewed  Notes from prior ED visits    Differential diagnosis includes, but is not limited to, pulmonary edema/acute CHF exacerbation in the setting of noncompliance with dialysis, COPD exacerbation, pickwickian syndrome, pneumonia, less likely ACS.  The patient is having no chest pain.  EKG is mostly unchanged from prior except for one new inverted T wave.  The patient is on BiPAP and feeling better.  Lab work is pending.  Chest x-ray is pending.    (Note that documentation was delayed due to multiple ED patients requiring immediate care.)  Chest x-ray consistent with pulmonary edema.  Metabolic panel is still pending but the patient has no leukocytosis.  BNP is greater than 1800.  Troponin is negative.   Clinical Course as of May 29 808  Tue May 29, 2018  0319 Potassium is elevated at 6.9 in the setting of missed hemodialysis.  BNP is over 1800.  Patient otherwise has no concerning lab abnormalities different from her normal.I called and spoke by phone with nephrology, Dr. Holley Raring.  He agreed with my plan for calcium gluconate 1 g IV and recommended giving 10g PO of Lokelma (rather than insulin/dextrose, albuterol, or Kayexalate).  He is currently performing an emergency dialysis on another patient so will likely take this patient for dialysis first thing in the morning as long as she remain respiratorily and hemodynamically stable.  I will update Dr. Marcille Blanco with hospital service about the plan  Potassium(!!): 6.9 [CF]    Clinical Course User Index [CF] Hinda Kehr, MD    ____________________________________________  FINAL CLINICAL IMPRESSION(S) / ED DIAGNOSES  Final diagnoses:  Acute on chronic congestive heart failure, unspecified heart failure type ([place])  ESRD on hemodialysis ([place])  Acute pulmonary edema (HCC)  Hyperkalemia     MEDICATIONS GIVEN DURING THIS VISIT:  Medications  sodium zirconium cyclosilicate (LOKELMA) packet 10 g (10 g Oral Given 05/29/18 0341)  torsemide  (DEMADEX) tablet 100 mg (has no administration in time range)  albuterol (PROVENTIL) (2.5 MG/3ML) 0.083% nebulizer solution 2.5 mg (has no administration in time range)  pantoprazole (PROTONIX) EC tablet 40 mg (has no administration in time range)  tiotropium (SPIRIVA) inhalation capsule 18 mcg (has no administration in time range)  oxyCODONE (Oxy IR/ROXICODONE) immediate release tablet 5 mg (has no administration in time range)  venlafaxine XR (EFFEXOR-XR) 24 hr capsule 37.5 mg (has no administration in time range)  cinacalcet (SENSIPAR) tablet 30 mg (has no administration in time range)  sevelamer carbonate (RENVELA) powder PACK 2.4 g (has no administration in time range)  feeding supplement (NEPRO CARB STEADY) liquid 237 mL (has no administration in time range)  multivitamin (RENA-VIT) tablet 1 tablet (has no administration in time range)  metoprolol succinate (TOPROL-XL) 24 hr tablet 100 mg (has no administration in time range)  amLODipine (NORVASC) tablet 10 mg (has no administration in time range)  atorvastatin (LIPITOR) tablet 40 mg (has no administration in time range)  ranolazine (RANEXA) 12 hr tablet 500 mg (has no administration in time range)  clopidogrel (PLAVIX) tablet 75 mg (has no administration in time range)  polyethylene glycol (MIRALAX / GLYCOLAX) packet 17 g (has no administration in time range)  aspirin chewable tablet 81 mg (has no administration in time range)  fluticasone furoate-vilanterol (BREO ELLIPTA) 200-25 MCG/INH 1 puff (has no administration in time range)  loratadine (CLARITIN) tablet 10 mg (has no administration in time range)  fluticasone (FLONASE) 50 MCG/ACT nasal spray 2 spray (has no administration in time range)  folic acid (FOLVITE) tablet 1  mg (has no administration in time range)  lidocaine (LIDODERM) 5 % 1 patch (has no administration in time range)  lubiprostone (AMITIZA) capsule 24 mcg (has no administration in time range)  meclizine (ANTIVERT)  tablet 25 mg (has no administration in time range)  calcium carbonate (TUMS - dosed in mg elemental calcium) chewable tablet 400 mg of elemental calcium (has no administration in time range)  varenicline (CHANTIX) tablet 0.5 mg (has no administration in time range)  doxycycline (VIBRA-TABS) tablet 100 mg (has no administration in time range)  nystatin (MYCOSTATIN) 100000 UNIT/ML suspension 500,000 Units (has no administration in time range)  heparin injection 5,000 Units (has no administration in time range)  acetaminophen (TYLENOL) tablet 650 mg (has no administration in time range)    Or  acetaminophen (TYLENOL) suppository 650 mg (has no administration in time range)  docusate sodium (COLACE) capsule 100 mg (has no administration in time range)  ondansetron (ZOFRAN) tablet 4 mg (has no administration in time range)    Or  ondansetron (ZOFRAN) injection 4 mg (has no administration in time range)  Chlorhexidine Gluconate Cloth 2 % PADS 6 each (has no administration in time range)  pentafluoroprop-tetrafluoroeth (GEBAUERS) aerosol 1 application (has no administration in time range)  lidocaine (PF) (XYLOCAINE) 1 % injection 5 mL (has no administration in time range)  lidocaine-prilocaine (EMLA) cream 1 application (has no administration in time range)  0.9 %  sodium chloride infusion (has no administration in time range)  0.9 %  sodium chloride infusion (has no administration in time range)  heparin injection 1,000 Units (has no administration in time range)  alteplase (CATHFLO ACTIVASE) injection 2 mg (has no administration in time range)  nitroGLYCERIN (NITROSTAT) SL tablet 0.4 mg (has no administration in time range)  calcium gluconate 1 g in sodium chloride 0.9 % 100 mL IVPB ( Intravenous Stopped 05/29/18 0353)     ED Discharge Orders    None       Note:  This document was prepared using Dragon voice recognition software and may include unintentional dictation errors.    Hinda Kehr, MD 05/29/18 [phone]

## 2018-05-29 NOTE — Progress Notes (Signed)
Pre HD assessment    05/29/18 0721  Neurological  Level of Consciousness Alert  Orientation Level Oriented X4  Respiratory  Respiratory Pattern Tachypnea  Chest Assessment Chest expansion symmetrical  Cardiac  ECG Monitor Yes  Cardiac Rhythm SB  Vascular  R Radial Pulse +2  L Radial Pulse +2  Edema Generalized  Integumentary  Integumentary (WDL) X  Skin Color Appropriate for ethnicity  Musculoskeletal  Musculoskeletal (WDL) X  Generalized Weakness Yes  Assistive Device None  GU Assessment  Genitourinary (WDL) X  Genitourinary Symptoms  (HD)  Psychosocial  Psychosocial (WDL) WDL

## 2018-05-29 NOTE — Progress Notes (Signed)
HD tx end    05/29/18 1048  Vital Signs  Pulse Rate 87  Pulse Rate Source Monitor  Resp 20  BP (!) 129/98  BP Location Right Arm  BP Method Automatic  Patient Position (if appropriate) Lying  Oxygen Therapy  SpO2 100 %  O2 Device Nasal Cannula  O2 Flow Rate (L/min) 4 L/min  During Hemodialysis Assessment  Dialysis Fluid Bolus Normal Saline  Bolus Amount (mL) 250 mL  Intra-Hemodialysis Comments Tx completed

## 2018-05-29 NOTE — Consult Note (Signed)
[facility] Consultation Note  Patient ID: Kimberly Shaffer, MRN: [id number], DOB/AGE: [birth year] 67 y.o. Admit date: 05/29/2018   Date of Consult: 05/29/2018 Primary Physician: [facility], [facility] Primary Cardiologist: Nehemiah Massed  Chief Complaint:  Chief Complaint  Patient presents with  . Respiratory Distress   Reason for Consult: Chest pain  HPI: 68 y.o. female with known coronary artery disease status post previous myocardial infarction and recent PCI and stent placement of tortuous complicated left anterior descending artery as well as an occluded right coronary artery and significant left circumflex atherosclerosis.  The patient was medically managed with others and now has had further issues with acute on chronic heart failure after dialysis episodes.  The patient has had a BNP of 1800 and now a troponin of 0.05 under conditions of dialysis.  She states that she has had increasing episodes of chest discomfort pressure in her chest off and on both with dialysis and after dialysis.  After this dialysis she had became more hypoxic and was giving BiPAP with a nonrebreather with improvements of symptoms.  She had chest pain with new onset ST depression in inferior anterior precordial leads diffusely which improved with nitrates oxygen and beta-blocker.  Currently she is improved significantly from the symptoms and EKG changes improved as well.  Currently she is hemodynamically stable  Past Medical History:  Diagnosis Date  . Asthma   . CAD (coronary artery disease)   . Chronic lower back pain   . COPD (chronic obstructive pulmonary disease) ([place])   . Depression   . Diabetes mellitus without complication (HCC)    NIDD  . H/O angina pectoris   . H/O blood clots   . Hypercholesteremia   . Hypertension   . Left rotator cuff tear   . Myocardial infarction ([place])   . Obesity   . Renal insufficiency   . Stroke ([place])   . Vertigo, aural       Surgical History:  Past  Surgical History:  Procedure Laterality Date  . A/V FISTULAGRAM Left 03/14/2017   Procedure: A/V Fistulagram;  Surgeon: Katha Cabal, MD;  Location: [place] [facility];  Service: Cardiovascular;  Laterality: Left;  . AV FISTULA PLACEMENT Left 11-04-2015  . AV FISTULA PLACEMENT Left 11/04/2015   Procedure: ARTERIOVENOUS (AV) FISTULA CREATION  ( BRACHIAL CEPHALIC );  Surgeon: Katha Cabal, MD;  Location: [facility];  Service: Vascular;  Laterality: Left;  . CARDIAC CATHETERIZATION    . CARDIAC CATHETERIZATION Left 07/19/2016   Procedure: Left Heart Cath and Coronary Angiography;  Surgeon: Corey Skains, MD;  Location: [place] [facility];  Service: Cardiovascular;  Laterality: Left;  . CORONARY ARTERY BYPASS GRAFT  2012  . CORONARY STENT PLACEMENT    . JOINT REPLACEMENT    . KNEE SURGERY Bilateral   . LEFT HEART CATH AND CORONARY ANGIOGRAPHY N/A 12/25/2017   Procedure: LEFT HEART CATH AND CORONARY ANGIOGRAPHY;  Surgeon: Teodoro Spray, MD;  Location: [place] [facility];  Service: Cardiovascular;  Laterality: N/A;  . LEFT HEART CATH AND CORONARY ANGIOGRAPHY Right 03/23/2018   Procedure: LEFT HEART CATH AND CORONARY ANGIOGRAPHY;  Surgeon: Dionisio David, MD;  Location: [place] [facility];  Service: Cardiovascular;  Laterality: Right;  . PERIPHERAL VASCULAR CATHETERIZATION N/A 11/13/2015   Procedure: Dialysis/Perma Catheter Insertion;  Surgeon: Katha Cabal, MD;  Location: [place] [facility];  Service: Cardiovascular;  Laterality: N/A;  . PERIPHERAL VASCULAR CATHETERIZATION N/A 12/04/2015   Procedure: Dialysis/Perma Catheter Insertion;  Surgeon: Katha Cabal, MD;  Location: [place] [facility];  Service: Cardiovascular;  Laterality: N/A;  . PERIPHERAL VASCULAR CATHETERIZATION Left 12/31/2015   Procedure: A/V Shuntogram/Fistulagram;  Surgeon: Algernon Huxley, MD;  Location: [place] [facility];  Service: Cardiovascular;  Laterality: Left;  . PERIPHERAL VASCULAR CATHETERIZATION  N/A 12/31/2015   Procedure: A/V Shunt Intervention;  Surgeon: Algernon Huxley, MD;  Location: [place] [facility];  Service: Cardiovascular;  Laterality: N/A;  . PERIPHERAL VASCULAR CATHETERIZATION Left 02/25/2016   Procedure: A/V Shuntogram/Fistulagram;  Surgeon: Algernon Huxley, MD;  Location: [place] [facility];  Service: Cardiovascular;  Laterality: Left;  . PERIPHERAL VASCULAR CATHETERIZATION N/A 03/18/2016   Procedure: Dialysis/Perma Catheter Removal;  Surgeon: Katha Cabal, MD;  Location: [place] [facility];  Service: Cardiovascular;  Laterality: N/A;  . REMOVAL OF A DIALYSIS CATHETER N/A 06/30/2017   Procedure: REMOVAL OF A DIALYSIS CATHETER;  Surgeon: Katha Cabal, MD;  Location: [facility];  Service: Vascular;  Laterality: N/A;  . REPLACEMENT TOTAL KNEE BILATERAL Bilateral      Home Meds: Prior to Admission medications   Medication Sig Start Date End Date Taking? Authorizing Provider  acetaminophen (TYLENOL) 500 MG tablet Take 1,000 mg by mouth every 8 (eight) hours as needed for mild pain.   Yes [provider]  albuterol (PROVENTIL HFA;VENTOLIN HFA) 108 (90 BASE) MCG/ACT inhaler Inhale 2 puffs into the lungs every 6 (six) hours as needed for wheezing or shortness of breath.    Yes [provider]  amLODipine (NORVASC) 10 MG tablet Take 1 tablet (10 mg total) by mouth daily. 03/23/18  Yes Loletha Grayer, MD  aspirin 81 MG chewable tablet Chew 81 mg by mouth daily.   Yes [provider]  atorvastatin (LIPITOR) 40 MG tablet Take 40 mg by mouth at bedtime. 03/28/18  Yes [provider]  budesonide-formoterol (SYMBICORT) 160-4.5 MCG/ACT inhaler Inhale 2 puffs into the lungs 2 (two) times daily.   Yes [provider]  calcium carbonate (TUMS - DOSED IN MG ELEMENTAL CALCIUM) 500 MG chewable tablet Chew 2 tablets by mouth 3 (three) times daily.   Yes [provider]  cetirizine (ZYRTEC) 10 MG tablet Take 10 mg by mouth daily.   Yes  [provider]  clopidogrel (PLAVIX) 75 MG tablet Take 75 mg by mouth daily.   Yes [provider]  doxycycline (VIBRA-TABS) 100 MG tablet Take 100 mg by mouth 2 (two) times daily. 05/25/18 06/08/18 Yes [provider]  fluticasone (FLONASE) 50 MCG/ACT nasal spray Place 2 sprays into both nostrils 2 (two) times daily.   Yes [provider]  folic acid (FOLVITE) 1 MG tablet Take 1 mg by mouth daily.   Yes [provider]  lidocaine (LIDODERM) 5 % Place 1 patch onto the skin daily. Remove & Discard patch within 12 hours or as directed by MD   Yes [provider]  lubiprostone (AMITIZA) 24 MCG capsule Take 24 mcg by mouth 2 (two) times daily with a meal.   Yes [provider]  meclizine (ANTIVERT) 25 MG tablet Take 25 mg by mouth 3 (three) times daily as needed for dizziness.   Yes [provider]  metoprolol succinate (TOPROL-XL) 100 MG 24 hr tablet Take 1 tablet (100 mg total) by mouth daily. Take with or immediately following a meal. 03/23/18  Yes Wieting, Richard, MD  multivitamin (RENA-VIT) TABS tablet Take 1 tablet by mouth at bedtime. 03/23/18  Yes Loletha Grayer, MD  nystatin (MYCOSTATIN) 100000 UNIT/ML suspension Use as directed 5 mLs in the mouth or throat 4 (four) times daily.   Yes [provider]  oxyCODONE (OXY IR/ROXICODONE) 5 MG immediate release tablet Take 5 mg by mouth every 6 (six) hours as needed (pain).  12/28/17  Yes [provider]  pantoprazole (PROTONIX) 40 MG tablet Take 40 mg by mouth 2 (two) times daily.   Yes [provider]  polyethylene glycol (MIRALAX / GLYCOLAX) packet Take 17 g by mouth daily.   Yes [provider]  ranolazine (RANEXA) 500 MG 12 hr tablet Take 500 mg by mouth 2 (two) times daily. 04/13/18  Yes [provider]  RENVELA 2.4 g PACK Take 2.4 g by mouth 2 (two) times daily. 01/14/18  Yes Fritzi Mandes, MD  SENSIPAR 30 MG tablet Take 1 tablet (30 mg  total) by mouth daily. 01/14/18  Yes Fritzi Mandes, MD  sodium chloride HYPERTONIC 3 % nebulizer solution Take 3 mLs by nebulization as needed for other.    Yes [provider]  SPIRIVA HANDIHALER 18 MCG inhalation capsule Place 1 capsule into inhaler and inhale daily.   Yes [provider]  torsemide (DEMADEX) 100 MG tablet Take 1 tablet (100 mg total) by mouth daily. 03/21/18  Yes Demetrios Loll, MD  varenicline (CHANTIX) 0.5 MG tablet Take 0.5 mg by mouth daily.   Yes [provider]  venlafaxine XR (EFFEXOR-XR) 37.5 MG 24 hr capsule Take 37.5 mg by mouth daily with breakfast.   Yes [provider]  nitroGLYCERIN (NITROLINGUAL) 0.4 MG/SPRAY spray Place 2 sprays under the tongue every 5 (five) minutes x 3 doses as needed for chest pain.     [provider]  Nutritional Supplements (FEEDING SUPPLEMENT, NEPRO CARB STEADY,) LIQD Take 237 mLs by mouth daily. 02/27/18   Hillary Bow, MD    Inpatient Medications:  . amLODipine  10 mg Oral Daily  . aspirin  81 mg Oral Daily  . atorvastatin  40 mg Oral QHS  . calcium carbonate  2 tablet Oral TID  . Chlorhexidine Gluconate Cloth  6 each Topical Q0600  . cinacalcet  30 mg Oral QAC breakfast  . clopidogrel  75 mg Oral Daily  . docusate sodium  100 mg Oral BID  . doxycycline  100 mg Oral BID  . feeding supplement (NEPRO CARB STEADY)  237 mL Oral Q24H  . fluticasone  2 spray Each Nare BID  . fluticasone furoate-vilanterol  1 puff Inhalation Daily  . folic acid  1 mg Oral Daily  . heparin  5,000 Units Subcutaneous Q8H  . [START ON 05/30/2018] Influenza vac split quadrivalent PF  0.5 mL Intramuscular Tomorrow-1000  . lidocaine  1 patch Transdermal Q24H  . loratadine  10 mg Oral Daily  . lubiprostone  24 mcg Oral BID WC  . metoprolol succinate  100 mg Oral Q breakfast  . multivitamin  1 tablet Oral QHS  . nystatin  5 mL Mouth/Throat QID  . pantoprazole  40 mg Oral BID  . [START ON 05/30/2018] pneumococcal 23  valent vaccine  0.5 mL Intramuscular Tomorrow-1000  . polyethylene glycol  17 g Oral Daily  . ranolazine  500 mg Oral BID  . sevelamer carbonate  2.4 g Oral BID WC  . sodium zirconium cyclosilicate  10 g Oral TID  . tiotropium  1 capsule Inhalation Daily  . torsemide  100 mg Oral Daily  . varenicline  0.5 mg Oral Daily  . venlafaxine XR  37.5 mg Oral Q breakfast   . sodium chloride    . sodium chloride      Allergies:  Allergies  Allergen Reactions  . Nystatin Hives and Itching  . Sulfa Antibiotics Swelling, Hives and Rash  . Niaspan [Niacin] Other (See Comments) and Hives    Social History   Socioeconomic History  . Marital status: Widowed    Spouse name: Not on file  . Number of children: Not on file  . Years of education: Not on file  . Highest education level: Not on file  Occupational History  . Occupation: retired  Scientific laboratory technician  . Financial resource strain: Not on file  . Food insecurity:    Worry: Not on file    Inability: Not on file  . Transportation needs:    Medical: Not on file    Non-medical: Not on file  Tobacco Use  . Smoking status: Current Every Day Smoker    Packs/day: 0.25    Types: Cigarettes  . Smokeless tobacco: Never Used  Substance and Sexual Activity  . Alcohol use: No    Alcohol/week: 0.0 standard drinks  . Drug use: No  . Sexual activity: Yes  Lifestyle  . Physical activity:    Days per week: Not on file    Minutes per session: Not on file  . Stress: Not on file  Relationships  . Social connections:    Talks on phone: Not on file    Gets together: Not on file    Attends religious service: Not on file    Active member of club or organization: Not on file    Attends meetings of clubs or organizations: Not on file    Relationship status: Not on file  . Intimate partner violence:    Fear of current or ex partner: Not on file    Emotionally abused: Not on file    Physically abused: Not on file    Forced sexual activity: Not on  file  Other Topics Concern  . Not on file  Social History Narrative  . Not on file     Family History  Problem Relation Age of Onset  . Cancer Mother   . Cancer Father   . Diabetes Brother   . Heart disease Brother      Review of Systems Positive for shortness of breath chest pain Negative for: General:  chills, fever, night sweats or weight changes.  Cardiovascular: PND orthopnea syncope dizziness  Dermatological skin lesions rashes Respiratory: Cough congestion Urologic: Frequent urination urination at night and hematuria Abdominal: negative for nausea, vomiting, diarrhea, bright red blood per rectum, melena, or hematemesis Neurologic: negative for visual changes, and/or hearing changes  All other systems reviewed and are otherwise negative except as noted above.  Labs: Recent Labs    05/29/18 0228 05/29/18 1303  TROPONINI <0.03 0.05*   Lab Results  Component Value Date   WBC 7.4 05/29/2018   HGB 9.1 (L) 05/29/2018   HCT 27.3 (L) 05/29/2018   MCV 92.0 05/29/2018   PLT 196 05/29/2018    Recent Labs  Lab 05/29/18 0228  05/29/18 1303  NA 134*   < > 135  K 6.9*   < > 4.0  CL 100   < > 94*  CO2 20*   < > 26  BUN 76*   < > 31*  CREATININE 11.03*   < > 5.87*  CALCIUM 8.3*   < > 8.6*  PROT 8.3*  --   --  BILITOT 0.9  --   --   ALKPHOS 59  --   --   ALT 9  --   --   AST 15  --   --   GLUCOSE 112*   < > 107*   < > = values in this interval not displayed.   Lab Results  Component Value Date   CHOL 132 03/23/2018   HDL 40 (L) 03/23/2018   LDLCALC 73 03/23/2018   TRIG 94 03/23/2018   No results found for: DDIMER  Radiology/Studies:  Dg Chest Port 1 View  Result Date: 05/29/2018 CLINICAL DATA:  Respiratory distress.  Missed dialysis 2 days ago. EXAM: PORTABLE CHEST 1 VIEW COMPARISON:  04/18/2018, chest CT 12/17/2015 FINDINGS: Unchanged cardiomegaly and mediastinal contours. Moderate pulmonary edema, increased from prior exam. No confluent airspace  disease. No large pleural effusion or pneumothorax. 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 IMPRESSION: Moderate pulmonary edema with unchanged cardiomegaly. Electronically Signed   By: Keith Rake M.D.   On: 05/29/2018 03:00    EKG: Normal sinus rhythm with ST depression in inferior and anterior precordial leads  Weights: Filed Weights   05/29/18 0645 05/29/18 0720 05/29/18 1100  Weight: 102.5 kg 104 kg 103.4 kg     Physical Exam: Blood pressure (!) 160/71, pulse 91, temperature 98.8 F (37.1 C), temperature source Oral, resp. rate (!) 24, height 5\' 6"  (1.676 m), weight 103.4 kg, SpO2 100 %. Body mass index is 36.79 kg/m. General: Well developed, well nourished, in no acute distress. Head eyes ears nose throat: Normocephalic, atraumatic, sclera non-icteric, no xanthomas, nares are without discharge. No apparent thyromegaly and/or mass  Lungs: Normal respiratory effort.  Few wheezes, some rales, no rhonchi.  Heart: RRR with normal S1 S2. no murmur gallop, no rub, PMI is normal size and placement, carotid upstroke normal without bruit, jugular venous pressure is normal Abdomen: Soft, non-tender, non-distended with normoactive bowel sounds. No hepatomegaly. No rebound/guarding. No obvious abdominal masses. Abdominal aorta is normal size without bruit Extremities: Trace edema. no cyanosis, no clubbing, no ulcers  Peripheral : 2+ bilateral upper extremity pulses, 2+ bilateral femoral pulses, 2+ bilateral dorsal pedal pulse Neuro: Alert and oriented. No facial asymmetry. No focal deficit. Moves all extremities spontaneously. Musculoskeletal: Normal muscle tone without kyphosis Psych:  Responds to questions appropriately with a normal affect.    Assessment: 67 year old female with known coronary disease status post PCI and stent placed in left anterior descending artery sleep apnea essential hypertension mixed  hyperlipidemia diabetes with end-stage renal disease and significant ischemic changes now improved with appropriate medication management without evidence of myocardial infarction  Plan: 1.  Continue supportive care for significant hypertension and ischemia including calcium channel blocker beta-blocker isosorbide and/or other nitrates 2.  Heparinization for unstable angina and concerns for myocardial infarction 3.  High intensity cholesterol therapy 14.  Proceed to cardiac catheterization to assess coronary anatomy and further treatment thereof is necessary.  Patient understands risk and benefits of cardiac catheterization.  This includes the possibility of death stroke heart attack infection bleeding or blood clot.  She is at low risk for conscious sedation  Signed, Corey Skains M.D. [facility] 05/29/2018, 6:49 PM

## 2018-05-29 NOTE — ED Notes (Signed)
Pt brought in by [facility] pt went to EMS bay with co shob for 2 days hx of copd. Pt is a dialysis patient that did not get dialysis on Saturday, last was Thursday. Pt denies any pain at this time, denies any recent illness. EMS gave 125 mg solumedrol IV , 20 g SL noted to right AC with bipap in place. RT in room, bipap continued per EDP orders. Pt A&O4 with skin cool and dry.

## 2018-05-29 NOTE — Progress Notes (Signed)
HD tx start    05/29/18 0743  Vital Signs  Pulse Rate (!) 58  Pulse Rate Source Monitor  Resp (!) 23  BP (!) 136/95  BP Location Right Arm  BP Method Automatic  Patient Position (if appropriate) Lying  Oxygen Therapy  SpO2 100 %  O2 Device Bi-PAP  During Hemodialysis Assessment  Blood Flow Rate (mL/min) 400 mL/min  Arterial Pressure (mmHg) -60 mmHg  Venous Pressure (mmHg) 140 mmHg  Transmembrane Pressure (mmHg) 100 mmHg  Ultrafiltration Rate (mL/min) 830 mL/min  Dialysate Flow Rate (mL/min) 800 ml/min  Conductivity: Machine  13.7  HD Safety Checks Performed Yes  Dialysis Fluid Bolus Normal Saline  Bolus Amount (mL) 250 mL  Intra-Hemodialysis Comments Tx initiated

## 2018-05-29 NOTE — Progress Notes (Signed)
Spoke with Dr. Nehemiah Massed via telephone to discuss plan of care pts troponin increased from 0.05 to 4.58 with  EKG today revealing ST depression and ST elevation.  Per Dr. Nehemiah Massed pt scheduled for cardiac catheterization tomorrow 05/29/18.  Dr. Nehemiah Massed agrees with starting heparin and nitroglycerin gtts overnight. Will continue to monitor and assess pt.  Marda Stalker, [place] Pager [phone] (please enter 7 digits) PCCM Consult Pager [phone] (please enter 7 digits)

## 2018-05-29 NOTE — ED Notes (Signed)
Attempted to call report, charge nurse states she will call me back.

## 2018-05-29 NOTE — Care Management (Signed)
RNCM has notified Estill Bamberg with [facility] of patient admit.

## 2018-05-29 NOTE — Progress Notes (Signed)
Yevonne Aline, NP and Jearld Adjutant, NP notified of elevated troponin. Wilnette Kales

## 2018-05-29 NOTE — ED Triage Notes (Signed)
Pt brought in via ems.  Pt with resp distress for past 2 days.  Hx dialysis.  Pt alert.  Iv solumedrol 125mg  given by EMS.  md at bedside.  RT at bedside.

## 2018-05-29 NOTE — ED Notes (Signed)
Attempted to call report, not able to take it due to "figuring out staffing".

## 2018-05-29 NOTE — Consult Note (Signed)
ANTICOAGULATION CONSULT NOTE - Initial Consult  Pharmacy Consult for heparin drip Indication: chest pain/ACS  Allergies  Allergen Reactions  . Nystatin Hives and Itching  . Sulfa Antibiotics Swelling, Hives and Rash  . Niaspan [Niacin] Other (See Comments) and Hives    Patient Measurements: Height: 5\' 6"  (167.6 cm) Weight: 227 lb 15.3 oz (103.4 kg) IBW/kg (Calculated) : 59.3 Heparin Dosing Weight: 82.6kg  Vital Signs: Temp: 98.8 F (37.1 C) (09/10 1100) Temp Source: Oral (09/10 1100) BP: 140/65 (09/10 1915) Pulse Rate: 84 (09/10 1915)  Labs: Recent Labs    05/29/18 0228 05/29/18 0750 05/29/18 1303 05/29/18 1841  HGB 9.3* 9.1*  --   --   HCT 28.3* 27.3*  --   --   PLT 204 196  --   --   CREATININE 11.03* 11.19* 5.87*  --   TROPONINI <0.03  --  0.05* 4.58*    Estimated Creatinine Clearance: 11.3 mL/min (A) (by C-G formula based on SCr of 5.87 mg/dL (H)).   Medical History: Past Medical History:  Diagnosis Date  . Asthma   . CAD (coronary artery disease)   . Chronic lower back pain   . COPD (chronic obstructive pulmonary disease) ([place])   . Depression   . Diabetes mellitus without complication (HCC)    NIDD  . H/O angina pectoris   . H/O blood clots   . Hypercholesteremia   . Hypertension   . Left rotator cuff tear   . Myocardial infarction ([place])   . Obesity   . Renal insufficiency   . Stroke ([place])   . Vertigo, aural     Medications:  Scheduled:  . amLODipine  10 mg Oral Daily  . aspirin  81 mg Oral Daily  . atorvastatin  40 mg Oral QHS  . calcium carbonate  2 tablet Oral TID  . Chlorhexidine Gluconate Cloth  6 each Topical Q0600  . cinacalcet  30 mg Oral QAC breakfast  . clopidogrel  75 mg Oral Daily  . docusate sodium  100 mg Oral BID  . doxycycline  100 mg Oral BID  . feeding supplement (NEPRO CARB STEADY)  237 mL Oral Q24H  . fluticasone  2 spray Each Nare BID  . fluticasone furoate-vilanterol  1 puff Inhalation Daily  . folic acid  1 mg  Oral Daily  . heparin  4,000 Units Intravenous Once  . [START ON 05/30/2018] Influenza vac split quadrivalent PF  0.5 mL Intramuscular Tomorrow-1000  . lidocaine  1 patch Transdermal Q24H  . loratadine  10 mg Oral Daily  . lubiprostone  24 mcg Oral BID WC  . metoprolol succinate  100 mg Oral Q breakfast  . multivitamin  1 tablet Oral QHS  . nystatin  5 mL Mouth/Throat QID  . pantoprazole  40 mg Oral BID  . [START ON 05/30/2018] pneumococcal 23 valent vaccine  0.5 mL Intramuscular Tomorrow-1000  . polyethylene glycol  17 g Oral Daily  . ranolazine  500 mg Oral BID  . sevelamer carbonate  2.4 g Oral BID WC  . sodium zirconium cyclosilicate  10 g Oral TID  . tiotropium  1 capsule Inhalation Daily  . torsemide  100 mg Oral Daily  . varenicline  0.5 mg Oral Daily  . venlafaxine XR  37.5 mg Oral Q breakfast    Assessment: Patient is a 67 year old female presenting w/ chest pain, elevated troponin. Pharmacy consulted to dose heparin drip. Baseline CBC within acceptable limits. Add on APTT and INR ordered.  No anticoagulation PTA and last subq heparin shot about 4.5 hr ago.  Goal of Therapy:  Heparin level 0.3-0.7 units/ml Monitor platelets by anticoagulation protocol: Yes   Plan:  Give 4000 units bolus x 1 Start heparin infusion at 1000 units/hr Check anti-Xa level in 8 hours and daily while on heparin Continue to monitor H&H and platelets  Celia Gibbons D Ahnyla Mendel, Pharm.D, BCPS Clinical Pharmacist 05/29/2018,7:34 PM

## 2018-05-30 ENCOUNTER — Encounter
Admission: EM | Disposition: A | Discharge status: Other Acute Inpatient Hospital | Payer: Self-pay | Source: Home / Self Care | Attending: Internal Medicine

## 2018-05-30 ENCOUNTER — Encounter: Payer: Self-pay | Admitting: Internal Medicine

## 2018-05-30 DIAGNOSIS — Z515 Encounter for palliative care: Secondary | ICD-10-CM

## 2018-05-30 DIAGNOSIS — I214 Non-ST elevation (NSTEMI) myocardial infarction: Secondary | ICD-10-CM

## 2018-05-30 DIAGNOSIS — N186 End stage renal disease: Secondary | ICD-10-CM

## 2018-05-30 DIAGNOSIS — I509 Heart failure, unspecified: Secondary | ICD-10-CM

## 2018-05-30 DIAGNOSIS — Z992 Dependence on renal dialysis: Secondary | ICD-10-CM

## 2018-05-30 HISTORY — PX: LEFT HEART CATH AND CORONARY ANGIOGRAPHY: CATH118249

## 2018-05-30 LAB — CBC
HEMATOCRIT: 25.7 % — AB (ref 35.0–47.0)
Hemoglobin: 8.6 g/dL — ABNORMAL LOW (ref 12.0–16.0)
MCH: 30.3 pg (ref 26.0–34.0)
MCHC: 33.4 g/dL (ref 32.0–36.0)
MCV: 90.9 fL (ref 80.0–100.0)
PLATELETS: 185 10*3/uL (ref 150–440)
RBC: 2.83 MIL/uL — AB (ref 3.80–5.20)
RDW: 18.9 % — AB (ref 11.5–14.5)
WBC: 7.9 10*3/uL (ref 3.6–11.0)

## 2018-05-30 LAB — GLUCOSE, CAPILLARY
Glucose-Capillary: 93 mg/dL (ref 70–99)
Glucose-Capillary: 79 mg/dL (ref 70–99)
Glucose-Capillary: 117 mg/dL — ABNORMAL HIGH (ref 70–99)

## 2018-05-30 LAB — BASIC METABOLIC PANEL
ANION GAP: 12 (ref 5–15)
BUN: 43 mg/dL — AB (ref 8–23)
CO2: 29 mmol/L (ref 22–32)
Calcium: 8.5 mg/dL — ABNORMAL LOW (ref 8.9–10.3)
Chloride: 94 mmol/L — ABNORMAL LOW (ref 98–111)
Creatinine, Ser: 7.71 mg/dL — ABNORMAL HIGH (ref 0.44–1.00)
GFR calc Af Amer: 6 mL/min — ABNORMAL LOW (ref >60)
GFR, EST NON AFRICAN AMERICAN: 5 mL/min — AB (ref >60)
GLUCOSE: 101 mg/dL — AB (ref 70–99)
POTASSIUM: 4.4 mmol/L (ref 3.5–5.1)
Sodium: 135 mmol/L (ref 135–145)

## 2018-05-30 LAB — TROPONIN I: Troponin I: 8.99 ng/mL (ref <0.03)

## 2018-05-30 LAB — HEPARIN LEVEL (UNFRACTIONATED)
HEPARIN UNFRACTIONATED: 0.29 [IU]/mL — AB (ref 0.30–0.70)
Heparin Unfractionated: <0.1 IU/mL — ABNORMAL LOW (ref 0.30–0.70)

## 2018-05-30 LAB — MAGNESIUM: Magnesium: 2.1 mg/dL (ref 1.7–2.4)

## 2018-05-30 LAB — PARATHYROID HORMONE, INTACT (NO CA): PTH: 218 pg/mL — ABNORMAL HIGH (ref 15–65)

## 2018-05-30 SURGERY — LEFT HEART CATH AND CORONARY ANGIOGRAPHY
Anesthesia: Moderate Sedation

## 2018-05-30 MED ORDER — MIDAZOLAM HCL 2 MG/2ML IJ SOLN
INTRAMUSCULAR | Status: AC
  Filled 2018-05-30: qty 2

## 2018-05-30 MED ORDER — ONDANSETRON HCL 4 MG PO TABS: 4.0000 mg | ORAL_TABLET | Freq: Four times a day (QID) | ORAL | 0 refills | Status: DC | PRN

## 2018-05-30 MED ORDER — CINACALCET HCL 30 MG PO TABS: 30.0000 mg | ORAL_TABLET | Freq: Every day | ORAL | Status: AC

## 2018-05-30 MED ORDER — SODIUM ZIRCONIUM CYCLOSILICATE 5 G PO PACK: 10.0000 g | PACK | Freq: Three times a day (TID) | ORAL | Status: DC

## 2018-05-30 MED ORDER — CHLORHEXIDINE GLUCONATE CLOTH 2 % EX PADS: 6.0000 | MEDICATED_PAD | Freq: Every day | CUTANEOUS | Status: DC

## 2018-05-30 MED ORDER — IOPAMIDOL (ISOVUE-300) INJECTION 61%
INTRAVENOUS | Status: DC | PRN
  Administered 2018-05-30: 170 mL via INTRAVENOUS

## 2018-05-30 MED ORDER — ONDANSETRON HCL 4 MG/2ML IJ SOLN: 4.0000 mg | Freq: Four times a day (QID) | INTRAMUSCULAR | 0 refills | Status: DC | PRN

## 2018-05-30 MED ORDER — MIDAZOLAM HCL 2 MG/2ML IJ SOLN
INTRAMUSCULAR | Status: DC | PRN
  Administered 2018-05-30: 1 mg via INTRAVENOUS

## 2018-05-30 MED ORDER — LIDOCAINE-PRILOCAINE 2.5-2.5 % EX CREA: 1.0000 "application " | TOPICAL_CREAM | CUTANEOUS | 0 refills | Status: AC | PRN

## 2018-05-30 MED ORDER — INFLUENZA VAC SPLIT HIGH-DOSE 0.5 ML IM SUSY: 0.5000 mL | PREFILLED_SYRINGE | INTRAMUSCULAR | 0 refills | Status: AC

## 2018-05-30 MED ORDER — DOXYCYCLINE HYCLATE 100 MG PO TABS: 100.0000 mg | ORAL_TABLET | Freq: Two times a day (BID) | ORAL | Status: DC

## 2018-05-30 MED ORDER — NITROGLYCERIN 0.4 MG SL SUBL: 0.4000 mg | SUBLINGUAL_TABLET | SUBLINGUAL | 12 refills | Status: DC | PRN

## 2018-05-30 MED ORDER — SODIUM CHLORIDE 0.9 % IV SOLN: 250.0000 mL | INTRAVENOUS | Status: DC | PRN

## 2018-05-30 MED ORDER — ACETAMINOPHEN 325 MG PO TABS: 650.0000 mg | ORAL_TABLET | Freq: Four times a day (QID) | ORAL | Status: AC | PRN

## 2018-05-30 MED ORDER — LIDOCAINE HCL (PF) 1 % IJ SOLN: 5.0000 mL | INTRAMUSCULAR | 0 refills | Status: DC | PRN

## 2018-05-30 MED ORDER — METOPROLOL SUCCINATE ER 100 MG PO TB24: 100.0000 mg | ORAL_TABLET | Freq: Every day | ORAL | Status: AC

## 2018-05-30 MED ORDER — HEPARIN (PORCINE) IN NACL 100-0.45 UNIT/ML-% IJ SOLN: 1100.0000 [IU]/h | INTRAMUSCULAR | Status: DC

## 2018-05-30 MED ORDER — CLOPIDOGREL BISULFATE 75 MG PO TABS: 75.0000 mg | ORAL_TABLET | Freq: Every day | ORAL | Status: AC

## 2018-05-30 MED ORDER — VARENICLINE TARTRATE 0.5 MG PO TABS: 0.5000 mg | ORAL_TABLET | Freq: Every day | ORAL | Status: DC

## 2018-05-30 MED ORDER — SODIUM CHLORIDE 0.9 % WEIGHT BASED INFUSION: 3.0000 mL/kg/h | INTRAVENOUS | Status: DC

## 2018-05-30 MED ORDER — TIOTROPIUM BROMIDE MONOHYDRATE 18 MCG IN CAPS: 1.0000 | ORAL_CAPSULE | Freq: Every day | RESPIRATORY_TRACT | 12 refills | Status: DC

## 2018-05-30 MED ORDER — LIDOCAINE 5 % EX PTCH: 1.0000 | MEDICATED_PATCH | CUTANEOUS | 0 refills | Status: DC

## 2018-05-30 MED ORDER — SEVELAMER CARBONATE 2.4 G PO PACK: 2.4000 g | PACK | Freq: Two times a day (BID) | ORAL | Status: DC

## 2018-05-30 MED ORDER — SODIUM CHLORIDE 0.9 % IV SOLN: 100.0000 mL | INTRAVENOUS | 0 refills | Status: DC | PRN

## 2018-05-30 MED ORDER — ALBUTEROL SULFATE (2.5 MG/3ML) 0.083% IN NEBU: 2.5000 mg | INHALATION_SOLUTION | RESPIRATORY_TRACT | 12 refills | Status: AC | PRN

## 2018-05-30 MED ORDER — SODIUM CHLORIDE 0.9 % WEIGHT BASED INFUSION: 1.0000 mL/kg/h | INTRAVENOUS | Status: DC

## 2018-05-30 MED ORDER — FLUTICASONE FUROATE-VILANTEROL 200-25 MCG/INH IN AEPB: 1.0000 | INHALATION_SPRAY | Freq: Every day | RESPIRATORY_TRACT | Status: AC

## 2018-05-30 MED ORDER — POLYETHYLENE GLYCOL 3350 17 G PO PACK: 17.0000 g | PACK | Freq: Every day | ORAL | 0 refills | Status: DC

## 2018-05-30 MED ORDER — PENTAFLUOROPROP-TETRAFLUOROETH EX AERO: 1.0000 "application " | INHALATION_SPRAY | CUTANEOUS | 0 refills | Status: DC | PRN

## 2018-05-30 MED ORDER — RENA-VITE PO TABS: 1.0000 | ORAL_TABLET | Freq: Every day | ORAL | 0 refills | Status: AC

## 2018-05-30 MED ORDER — HEPARIN (PORCINE) IN NACL 1000-0.9 UT/500ML-% IV SOLN
INTRAVENOUS | Status: AC
  Filled 2018-05-30: qty 1000

## 2018-05-30 MED ORDER — FENTANYL CITRATE (PF) 100 MCG/2ML IJ SOLN
INTRAMUSCULAR | Status: AC
  Filled 2018-05-30: qty 2

## 2018-05-30 MED ORDER — HEPARIN BOLUS VIA INFUSION
1200.0000 [IU] | Freq: Once | INTRAVENOUS | Status: AC
  Administered 2018-05-30: 1200 [IU] via INTRAVENOUS
  Filled 2018-05-30: qty 1200

## 2018-05-30 MED ORDER — HEPARIN SODIUM (PORCINE) 1000 UNIT/ML DIALYSIS: 1000.0000 [IU] | INTRAMUSCULAR | Status: DC | PRN

## 2018-05-30 MED ORDER — SODIUM CHLORIDE 0.9% FLUSH: 3.0000 mL | Freq: Two times a day (BID) | INTRAVENOUS | Status: DC

## 2018-05-30 MED ORDER — NEPRO/CARBSTEADY PO LIQD: 237.0000 mL | ORAL | 0 refills | Status: DC

## 2018-05-30 MED ORDER — DOCUSATE SODIUM 100 MG PO CAPS: 100.0000 mg | ORAL_CAPSULE | Freq: Two times a day (BID) | ORAL | 0 refills | Status: DC

## 2018-05-30 MED ORDER — PNEUMOCOCCAL VAC POLYVALENT 25 MCG/0.5ML IJ INJ: 0.5000 mL | INJECTION | INTRAMUSCULAR | Status: AC

## 2018-05-30 MED ORDER — SODIUM CHLORIDE 0.9% FLUSH: 3.0000 mL | INTRAVENOUS | Status: DC | PRN

## 2018-05-30 MED ORDER — ALTEPLASE 2 MG IJ SOLR: 2.0000 mg | Freq: Once | INTRAMUSCULAR | Status: DC | PRN

## 2018-05-30 MED ORDER — ASPIRIN 81 MG PO CHEW: 81.0000 mg | CHEWABLE_TABLET | ORAL | Status: DC

## 2018-05-30 MED ORDER — FENTANYL CITRATE (PF) 100 MCG/2ML IJ SOLN
INTRAMUSCULAR | Status: DC | PRN
  Administered 2018-05-30: 25 ug via INTRAVENOUS

## 2018-05-30 SURGICAL SUPPLY — 9 items
CATH INFINITI 5FR ANG PIGTAIL (CATHETERS) ×3 IMPLANT
CATH INFINITI 5FR JL4 (CATHETERS) ×3 IMPLANT
CATH INFINITI JR4 5F (CATHETERS) ×3 IMPLANT
DEVICE CLOSURE MYNXGRIP 5F (Vascular Products) ×3 IMPLANT
KIT MANI 3VAL PERCEP (MISCELLANEOUS) ×3 IMPLANT
NEEDLE PERC 18GX7CM (NEEDLE) ×3 IMPLANT
PACK CARDIAC CATH (CUSTOM PROCEDURE TRAY) ×3 IMPLANT
SHEATH AVANTI 5FR X 11CM (SHEATH) ×3 IMPLANT
WIRE GUIDERIGHT .035X150 (WIRE) ×3 IMPLANT

## 2018-05-30 NOTE — Progress Notes (Signed)
[facility]  ROUNDING NOTE   Subjective:  Patient seen at bedside in the critical care unit. It appears that she has had a myocardial infarction with a troponin of 8.9 at the moment. She is currently on nitroglycerin drip as well as heparin drip. In addition she is now off of BiPAP. Serum potassium corrected down to 4.4.  Objective:  Vital signs in last 24 hours:  Temp:  [98.3 F (36.8 C)-98.8 F (37.1 C)] 98.3 F (36.8 C) (09/11 0800) Pulse Rate:  [78-96] 80 (09/11 0800) Resp:  [15-37] 15 (09/11 0800) BP: (106-160)/(48-110) 144/83 (09/11 0800) SpO2:  [84 %-100 %] 97 % (09/11 0800) Weight:  [103.4 kg] 103.4 kg (09/10 1100)  Weight change: 1.941 kg Filed Weights   05/29/18 0645 05/29/18 0720 05/29/18 1100  Weight: 102.5 kg 104 kg 103.4 kg    Intake/Output: I/O last 3 completed shifts: In: 390.1 [I.V.:280.4; IV Piggyback:109.7] Out: 2009 [Other:2009]   Intake/Output this shift:  No intake/output data recorded.  Physical Exam: General: No acute distress  Head: Normocephalic, atraumatic. Hearing intact  Eyes: Anicteric  Neck: Supple, trachea midline  Lungs:  Scattered rhonchi, normal effort  Heart: S1S2 no rubs  Abdomen:  Soft, nontender, bowel sounds present  Extremities: trace peripheral edema.  Neurologic: Awake, alert, following commands  Skin: No lesions  Access:  LUE AVF    Basic Metabolic Panel: Recent Labs  Lab 05/29/18 0228 05/29/18 0750 05/29/18 0751 05/29/18 1303 05/30/18 0413  NA 134* 132*  --  135 135  K 6.9* >7.5*  --  4.0 4.4  CL 100 99  --  94* 94*  CO2 20* 18*  --  26 29  GLUCOSE 112* 113*  --  107* 101*  BUN 76* 77*  --  31* 43*  CREATININE 11.03* 11.19*  --  5.87* 7.71*  CALCIUM 8.3* 8.1*  --  8.6* 8.5*  MG 2.3  --  2.4  --  2.1  PHOS  --  9.7*  --   --   --     Liver Function Tests: Recent Labs  Lab 05/29/18 0228  AST 15  ALT 9  ALKPHOS 59  BILITOT 0.9  PROT 8.3*  ALBUMIN 3.8   Recent Labs  Lab  05/29/18 0228  LIPASE 19   No results for input(s): AMMONIA in the last 168 hours.  CBC: Recent Labs  Lab 05/29/18 0228 05/29/18 0750 05/30/18 0413  WBC 8.3 7.4 7.9  NEUTROABS 5.8  --   --   HGB 9.3* 9.1* 8.6*  HCT 28.3* 27.3* 25.7*  MCV 92.4 92.0 90.9  PLT 204 196 185    Cardiac Enzymes: Recent Labs  Lab 05/29/18 0228 05/29/18 1303 05/29/18 1841 05/30/18 0019  TROPONINI <0.03 0.05* 4.58* 8.99*    BNP: Invalid input(s): POCBNP  CBG: Recent Labs  Lab 05/29/18 0649  GLUCAP 111*    Microbiology: Results for orders placed or performed during the hospital encounter of 05/29/18  MRSA PCR Screening     Status: None   Collection Time: 05/29/18  6:51 AM  Result Value Ref Range Status   MRSA by PCR NEGATIVE NEGATIVE Final    Comment:        The GeneXpert MRSA Assay (FDA approved for NASAL specimens only), is one component of a comprehensive MRSA colonization surveillance program. It is not intended to diagnose MRSA infection nor to guide or monitor treatment for MRSA infections. Performed at [facility], [address] [place], [place] [address]  Coagulation Studies: Recent Labs    05/29/18 01-03-07  LABPROT 13.2  INR 1.01    Urinalysis: No results for input(s): COLORURINE, LABSPEC, PHURINE, GLUCOSEU, HGBUR, BILIRUBINUR, KETONESUR, PROTEINUR, UROBILINOGEN, NITRITE, LEUKOCYTESUR in the last 72 hours.  Invalid input(s): APPERANCEUR    Imaging: Dg Chest Port 1 View  Result Date: 05/29/2018 CLINICAL DATA:  Respiratory distress.  Missed dialysis 2 days ago. EXAM: PORTABLE CHEST 1 VIEW COMPARISON:  04/18/2018, chest CT 12/17/2015 FINDINGS: Unchanged cardiomegaly and mediastinal contours. Moderate pulmonary edema, increased from prior exam. No confluent airspace disease. No large pleural effusion or pneumothorax. 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 IMPRESSION: Moderate  pulmonary edema with unchanged cardiomegaly. Electronically Signed   By: Keith Rake M.D.   On: 05/29/2018 03:00     Medications:   . sodium chloride    . sodium chloride    . sodium chloride    . [START ON 05/31/2018] sodium chloride     Followed by  . [START ON 05/31/2018] sodium chloride    . heparin 1,100 Units/hr (05/30/18 0700)  . nitroGLYCERIN 50 mcg/min (05/30/18 0700)   . amLODipine  10 mg Oral Daily  . aspirin  81 mg Oral Daily  . [START ON 05/31/2018] aspirin  81 mg Oral Pre-Cath  . atorvastatin  40 mg Oral QHS  . calcium carbonate  2 tablet Oral TID  . Chlorhexidine Gluconate Cloth  6 each Topical Q0600  . cinacalcet  30 mg Oral QAC breakfast  . clopidogrel  75 mg Oral Daily  . docusate sodium  100 mg Oral BID  . doxycycline  100 mg Oral BID  . feeding supplement (NEPRO CARB STEADY)  237 mL Oral Q24H  . fluticasone  2 spray Each Nare BID  . fluticasone furoate-vilanterol  1 puff Inhalation Daily  . folic acid  1 mg Oral Daily  . Influenza vac split quadrivalent PF  0.5 mL Intramuscular Tomorrow-1000  . lidocaine  1 patch Transdermal Q24H  . loratadine  10 mg Oral Daily  . lubiprostone  24 mcg Oral BID WC  . metoprolol succinate  100 mg Oral Q breakfast  . multivitamin  1 tablet Oral QHS  . nystatin  5 mL Mouth/Throat QID  . pantoprazole  40 mg Oral BID  . pneumococcal 23 valent vaccine  0.5 mL Intramuscular Tomorrow-1000  . polyethylene glycol  17 g Oral Daily  . ranolazine  500 mg Oral BID  . sevelamer carbonate  2.4 g Oral BID WC  . sodium chloride flush  3 mL Intravenous Q12H  . sodium zirconium cyclosilicate  10 g Oral TID  . tiotropium  1 capsule Inhalation Daily  . torsemide  100 mg Oral Daily  . varenicline  0.5 mg Oral Daily  . venlafaxine XR  37.5 mg Oral Q breakfast   sodium chloride, sodium chloride, sodium chloride, acetaminophen **OR** acetaminophen, albuterol, alteplase, heparin, lidocaine (PF), lidocaine-prilocaine, meclizine,  nitroGLYCERIN, ondansetron **OR** ondansetron (ZOFRAN) IV, oxyCODONE, pentafluoroprop-tetrafluoroeth, sodium chloride flush  Assessment/ Plan:  67 y.o. female with end stage renal disease on hemodialysis, coronary artery disease status post CABG, hypertension, peripheral vascular disease, diabetes mellitus type 2, hyperlipidemia, bilateral total knee replacement, PD catheter removal June 30, 2017, s/p PCI mLAD 03/2018  [facility]TTS/left AVF/100.5 kg  1.  ESRD on HD TTS. 2.  Anemia of chronic kidney disease. 3.  Secondary hyperparathyroidism. 4.  Acute respiratory failure secondary to pulmonary edema. 5.  Hyperkalemia.  Plan: The patient's respiratory status has improved significantly with dialysis and ultrafiltration.  No urgent indication for dialysis today.  Hyperkalemia has also corrected with a serum potassium of 4.4.  Continue sodium zirconium for now.  We will plan for hemodialysis again tomorrow.  She is currently on nitroglycerin drip as well as heparin drip for myocardial infarction with a troponin of 8.9.  Further plan as per cardiology.    LOS: 1 Kimberly Shaffer 9/11/20199:02 AM

## 2018-05-30 NOTE — Consult Note (Signed)
Consultation Note Date: 05/30/2018   Patient Name: Kimberly Shaffer  DOB: 08-Jun-1951  MRN: [id number]  Age / Sex: 67 y.o., female  PCP: [facility] Referring Physician: Nicholes Mango, MD  Reason for Consultation: Establishing goals of care  HPI/Patient Profile: 66 y.o. female  with past medical history of CAD s/p CABG and new stent placement approximately 2 months ago, DM, COPD, ESRD on HD, CVA, bilateral knee replacements and lumbago who was admitted on 05/29/2018 with respiratory distress requiring BiPAP, post dialysis the patient suffered a NSTEMI with a troponin rise to 8.9.  Cardiac cath revealed a new 85% stenosis of her LAD.  Her recently placed stent was patent.  PMT was consulted for [place].  Clinical Assessment and Goals of Care:  I have reviewed medical records including EPIC notes, labs and imaging, received report from the bedside RN, assessed the patient and then talked with her to discuss diagnosis prognosis, GOC, EOL wishes, disposition and options.  I introduced Palliative Medicine as specialized medical care for people living with serious illness. It focuses on providing relief from the symptoms and stress of a serious illness. The goal is to improve quality of life for both the patient and the family.  We discussed a brief life review of the patient.  She is the mother of 5 and grandmother of 43.  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   Her husband died 9 years ago.  We discussed the tribulations of relationships.  She currently lives with her daughter Fredricka Bonine and her son Fannie Knee.  Her two other sons are incarcerated.  Fannie Knee is a quadriplegic.  Sereniti's life revolves around caring for Fannie Knee and her grand children.  She misses her father very much - she is just like  him.  As far as functional and nutritional status Mercer reports she is still eating fairly well at at home she was walking and performing her own ADLs.  However, Ariyel cries as she tells me that her family expects her to do thing (like cooking or the dishes) and she physically is too weak to do them.  Jayla states she no longer drinks alcohol but she is still an active smoker.  We discussed her current illness and what it means in the larger context of her on-going co-morbidities.  Natural disease trajectory and expectations at EOL were discussed.  Specifically we discussed her progressively weakening heart  - this is worsened by the stress of hemodialysis.  Petrona cries and expresses that she knows her time is limited but her family does not know and does not understand.  She pleads for a family meeting to explain her health to her daughters so that they can prepare.  I attempted to elicit values and goals of care important to the patient.  She is currently a DNR / DNI and she has a strong will to live  to continue caring for Jamal.  The difference between aggressive medical intervention and comfort care was considered in light of the patient's goals of care. At this point Elleni would like to continue full scope treatment - treat the treatable.  After meeting with [facility] I spoke with her daughter [name] to convey Rodnisha's request for her to be HCPOA and to explain her mother's NSTEMI and why that is critical along with ongoing HD to her prognosis.  Emiko listened attentively and seemed to understand.   Primary Decision Maker:  PATIENT.  Roselie indicates that if she was unable to make her own decisions she would want [name] (youngest daughter) to make her health care decisions.    SUMMARY OF RECOMMENDATIONS    Patient is transferring to [facility] for further care. Please consult Palliative Medicine at [facility] so that a family meeting can be scheduled.  It is important to [place] that [place] have a good  understanding of her health and prognosis.   Code Status/Advance Care Planning:  DNR   Symptom Management:   Per primary team  Additional Recommendations (Limitations, Scope, Preferences):  Full Scope Treatment   Psycho-social/Spiritual:   Desire for further Chaplaincy support: yes  Prognosis:  To be determined towards the end of her hospital stay.  At this point she is at high risk of an acute event (another MI).  Whether or not her heart will allow her to tolerate hemodialysis is key to her prognosis.   Discharge Planning: Transferring to another hospital for further care.      Primary Diagnoses: Present on Admission: . Acute respiratory failure with hypoxemia ([place])   I have reviewed the medical record, interviewed the patient and family, and examined the patient. The following aspects are pertinent.  Past Medical History:  Diagnosis Date  . Asthma   . CAD (coronary artery disease)   . Chronic lower back pain   . COPD (chronic obstructive pulmonary disease) ([place])   . Depression   . Diabetes mellitus without complication (HCC)    NIDD  . H/O angina pectoris   . H/O blood clots   . Hypercholesteremia   . Hypertension   . Left rotator cuff tear   . Myocardial infarction ([place])   . Obesity   . Renal insufficiency   . Stroke ([place])   . Vertigo, aural    Social History   Socioeconomic History  . Marital status: Widowed    Spouse name: Not on file  . Number of children: Not on file  . Years of education: Not on file  . Highest education level: Not on file  Occupational History  . Occupation: retired  Scientific laboratory technician  . Financial resource strain: Not on file  . Food insecurity:    Worry: Not on file    Inability: Not on file  . Transportation needs:    Medical: Not on file    Non-medical: Not on file  Tobacco Use  . Smoking status: Current Every Day Smoker    Packs/day: 0.25    Types: Cigarettes  . Smokeless tobacco: Never Used  Substance and Sexual  Activity  . Alcohol use: No    Alcohol/week: 0.0 standard drinks  . Drug use: No  . Sexual activity: Yes  Lifestyle  . Physical activity:    Days per week: Not on file    Minutes per session: Not on file  . Stress: Not on file  Relationships  . Social connections:    Talks on phone: Not  on file    Gets together: Not on file    Attends religious service: Not on file    Active member of club or organization: Not on file    Attends meetings of clubs or organizations: Not on file    Relationship status: Not on file  Other Topics Concern  . Not on file  Social History Narrative  . Not on file   Family History  Problem Relation Age of Onset  . Cancer Mother   . Cancer Father   . Diabetes Brother   . Heart disease Brother    Scheduled Meds: . amLODipine  10 mg Oral Daily  . aspirin  81 mg Oral Daily  . atorvastatin  40 mg Oral QHS  . calcium carbonate  2 tablet Oral TID  . Chlorhexidine Gluconate Cloth  6 each Topical Q0600  . cinacalcet  30 mg Oral QAC breakfast  . clopidogrel  75 mg Oral Daily  . docusate sodium  100 mg Oral BID  . doxycycline  100 mg Oral BID  . feeding supplement (NEPRO CARB STEADY)  237 mL Oral Q24H  . fluticasone  2 spray Each Nare BID  . fluticasone furoate-vilanterol  1 puff Inhalation Daily  . folic acid  1 mg Oral Daily  . Influenza vac split quadrivalent PF  0.5 mL Intramuscular Tomorrow-1000  . lidocaine  1 patch Transdermal Q24H  . loratadine  10 mg Oral Daily  . lubiprostone  24 mcg Oral BID WC  . metoprolol succinate  100 mg Oral Q breakfast  . multivitamin  1 tablet Oral QHS  . nystatin  5 mL Mouth/Throat QID  . pantoprazole  40 mg Oral BID  . pneumococcal 23 valent vaccine  0.5 mL Intramuscular Tomorrow-1000  . polyethylene glycol  17 g Oral Daily  . ranolazine  500 mg Oral BID  . sevelamer carbonate  2.4 g Oral BID WC  . sodium zirconium cyclosilicate  10 g Oral TID  . tiotropium  1 capsule Inhalation Daily  . torsemide  100 mg  Oral Daily  . varenicline  0.5 mg Oral Daily  . venlafaxine XR  37.5 mg Oral Q breakfast   Continuous Infusions: . sodium chloride    . sodium chloride    . heparin 1,100 Units/hr (05/30/18 1521)  . nitroGLYCERIN 50 mcg/min (05/30/18 0700)   PRN Meds:.sodium chloride, sodium chloride, acetaminophen **OR** acetaminophen, albuterol, alteplase, heparin, lidocaine (PF), lidocaine-prilocaine, meclizine, nitroGLYCERIN, ondansetron **OR** ondansetron (ZOFRAN) IV, oxyCODONE, pentafluoroprop-tetrafluoroeth Allergies  Allergen Reactions  . Nystatin Hives and Itching  . Sulfa Antibiotics Swelling, Hives and Rash  . Niaspan [Niacin] Other (See Comments) and Hives   Review of Systems depressed, no chest pain or shortness of breath currently  Physical Exam  Well developed elderly female (appears older than stated age), lying in bed with upper extremity tremor, A&O, moderately emotionally labile. CV rapid, irreg irreg. Resp no increased work of breathing Abdomen soft, nt, nd  Vital Signs: BP (!) 150/71   Pulse 75   Temp 98.9 F (37.2 C) (Oral)   Resp 16   Ht 5\' 6"  (1.676 m)   Wt 103.4 kg   SpO2 98%   BMI 36.79 kg/m  Pain Scale: 0-10 POSS *See Group Information*: 1-Acceptable,Awake and alert Pain Score: 0-No pain   SpO2: SpO2: 98 % O2 Device:SpO2: 98 % O2 Flow Rate: .O2 Flow Rate (L/min): 4 L/min  IO: Intake/output summary:   Intake/Output Summary (Last 24 hours) at 05/30/2018 1627 Last data filed  at 05/30/2018 1100 Gross per 24 hour  Intake 400.38 ml  Output -  Net 400.38 ml    LBM:   Baseline Weight: Weight: 102.1 kg Most recent weight: Weight: 103.4 kg     Palliative Assessment/Data: 30%     Time In:  2:30 Time Out: 4:00 Time Total: 90 min. Greater than 50%  of this time was spent counseling and coordinating care related to the above assessment and plan.  Signed by: Florentina Jenny, PA-C Palliative Medicine Pager: [phone]  Please contact Palliative  Medicine Team phone at [phone] for questions and concerns.  For individual provider: See Shea Evans

## 2018-05-30 NOTE — Discharge Summary (Signed)
Physician Discharge Summary  Patient ID: AMAN BATLEY MRN: [id number] DOB/AGE: Sep 22, 1950 67 y.o.  Admit date: 05/29/2018 Discharge date: 05/30/2018  Admission Diagnoses: hypoxemic respiratory failure secondary to pulmonary edema secondary to missing hemodialysis  Discharge Diagnoses:  Active Problems:   Acute respiratory failure with hypoxemia (HCC)   Acute on chronic congestive heart failure (HCC)   ESRD on hemodialysis [facility])   Palliative care by specialist severe ischemic coronary artery disease  Discharged Condition: stable  Hospital Course: Mrs. Stanko is a [name] with a PMH as listed below who presented to [facility] ED on 05/29/18 with c/o Respiratory Distress requiring BiPAP. She reports progressive shortness of breath for approximately 2 days. She denies wheezing, cough, fever/chills, or sick contacts. She does report swelling to bilateral LE.Pt has ESRD and is on HD (T, Th, S), and she reports that she missed her HD session on Saturday 05/26/18,with her last HD session completedon Thursday 05/24/18. Initial workup in the ED reveals BNP 1851, negative troponin, Na 134, K 6.9, Creatinine 11, Anion gap 14, and serum Bicarb 20. CXR is concerning for moderate pulmonary edema. In the ED she received 1g Ca Gluconate and Sodium Zirconium for Hyperkalemia.She is admitted to [facility] for treatment of Acute Hypoxic Respiratory Failure requiring BiPAP secondary to volume overload from missed HD session and Hyperkalemia She received emergent hemodialysis and had significant improvement in respiratory status. She then later on that day developed 8 out of 10 chest pain with significant inferolateral ischemic changes. She was seen by cardiology, was placed on heparin, nitroglycerin. Was taken to the Cath Lab today which revealed severe multivessel disease as detailed by cardiac catheterization note. Has been accepted at [facility] for further intervention.  Consults:  Cardiology  Significant Diagnostic Studies: Cardiac Catheterization  Treatments: hemodialysis, cardiac catheterization, anticoagulation  Discharge Exam: Blood pressure 134/60, pulse 75, temperature 98.9 F (37.2 C), temperature source Oral, resp. rate 17, height 5\' 6"  (1.676 m), weight 103.4 kg, SpO2 98 %. Patient is awake, alert in no acute distress Vital signs:       Please see the above listed vital signs HEENT, trachea midline, no thyromegaly appreciated Cardiovascular:           Regular rate and rhythm Pulmonary:      Clear to auscultation Abdominal:      Positive bowel sounds, soft exam Extremities:     No clubbing, cyanosis or edema Neurologic:      Moves all extremities  Disposition: transfer to [facility], DO  Signed: [place] 05/30/2018, 6:18 PM

## 2018-05-30 NOTE — Progress Notes (Signed)
No charge note.  Full note to follow.  Spoke at bedside with patient and then with her daughter [name] over the phone.  Patient would want Emiko to be her HCPOA.  She is a DNR.   Patient strongly desired Palliative meeting with her two daughters so that they could understand her health conditions.  Per RN patient is about to transfer to [facility].  As it appears unlikely that we will be able to have a meeting with the family while the patient is in [facility] I had a detailed conversation on the phone with daughter [name].   Explained DNR and briefly explained NSTEMI and CHF in setting of ESRD.  Recommend Palliative Care follow up at [facility].  Patient strongly desires a face to face family meeting with her daughters.  Florentina Jenny, PA-C Palliative Medicine Pager: [phone]

## 2018-05-30 NOTE — Progress Notes (Addendum)
[facility] [place] NAME: Kimberly Shaffer    MR#:  [id number]  DATE OF BIRTH:  May 30, 1951  SUBJECTIVE:  CHIEF COMPLAINT: pt had cardiac cath today for NSTEMI ,anxious during my visit  REVIEW OF SYSTEMS:  CONSTITUTIONAL: No fever, fatigue or weakness.  EYES: No blurred or double vision.  EARS, NOSE, AND THROAT: No tinnitus or ear pain.  RESPIRATORY: No cough, improving shortness of breath, denies wheezing or hemoptysis.  CARDIOVASCULAR: No chest pain, orthopnea, edema.  GASTROINTESTINAL: No nausea, vomiting, diarrhea or abdominal pain.  GENITOURINARY: No dysuria, hematuria.  ENDOCRINE: No polyuria, nocturia,  HEMATOLOGY: No anemia, easy bruising or bleeding SKIN: No rash or lesion. MUSCULOSKELETAL: No joint pain or arthritis.   NEUROLOGIC: No tingling, numbness, weakness.  PSYCHIATRY: No anxiety or depression.   DRUG ALLERGIES:   Allergies  Allergen Reactions  . Nystatin Hives and Itching  . Sulfa Antibiotics Swelling, Hives and Rash  . Niaspan [Niacin] Other (See Comments) and Hives    VITALS:  Blood pressure (!) 150/71, pulse 75, temperature 98.3 F (36.8 C), temperature source Oral, resp. rate 16, height 5\' 6"  (1.676 m), weight 103.4 kg, SpO2 97 %.  PHYSICAL EXAMINATION:  GENERAL:  67 y.o.-year-old patient lying in the bed with no acute distress.  EYES: Pupils equal, round, reactive to light and accommodation. No scleral icterus. Extraocular muscles intact.  HEENT: Head atraumatic, normocephalic. Oropharynx and nasopharynx clear.  NECK:  Supple, no jugular venous distention. No thyroid enlargement, no tenderness.  LUNGS: Moderate breath sounds bilaterally, no wheezing, rales,rhonchi or crepitation. No use of accessory muscles of respiration.  CARDIOVASCULAR: S1, S2 normal. No murmurs, rubs, or gallops.  ABDOMEN: Soft, nontender, nondistended. Bowel sounds present. No organomegaly or mass.  EXTREMITIES: No pedal edema,  cyanosis, or clubbing.  Chronic resting tremors NEUROLOGIC: Cranial nerves II through XII are intact.  Sensation intact. Gait not checked.  PSYCHIATRIC: The patient is alert and oriented x 3.  SKIN: No obvious rash, lesion, or ulcer.    LABORATORY PANEL:   CBC Recent Labs  Lab 05/30/18 0413  WBC 7.9  HGB 8.6*  HCT 25.7*  PLT 185   ------------------------------------------------------------------------------------------------------------------  Chemistries  Recent Labs  Lab 05/29/18 0228  05/30/18 0413  NA 134*   < > 135  K 6.9*   < > 4.4  CL 100   < > 94*  CO2 20*   < > 29  GLUCOSE 112*   < > 101*  BUN 76*   < > 43*  CREATININE 11.03*   < > 7.71*  CALCIUM 8.3*   < > 8.5*  MG 2.3   < > 2.1  AST 15  --   --   ALT 9  --   --   ALKPHOS 59  --   --   BILITOT 0.9  --   --    < > = values in this interval not displayed.   ------------------------------------------------------------------------------------------------------------------  Cardiac Enzymes Recent Labs  Lab 05/30/18 0019  TROPONINI 8.99*   ------------------------------------------------------------------------------------------------------------------  RADIOLOGY:  Dg Chest Port 1 View  Result Date: 05/29/2018 CLINICAL DATA:  Respiratory distress.  Missed dialysis 2 days ago. EXAM: PORTABLE CHEST 1 VIEW COMPARISON:  04/18/2018, chest CT 12/17/2015 FINDINGS: Unchanged cardiomegaly and mediastinal contours. Moderate pulmonary edema, increased from prior exam. No confluent airspace disease. No large pleural effusion or pneumothorax. 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 IMPRESSION: Moderate  pulmonary edema with unchanged cardiomegaly. Electronically Signed   By: Keith Rake M.D.   On: 05/29/2018 03:00    EKG:   Orders placed or performed during the hospital encounter of 05/29/18  . ED EKG  . ED EKG  . EKG 12-Lead  . EKG 12-Lead   . EKG 12-Lead  . EKG 12-Lead    ASSESSMENT AND PLAN:   This is a 67 year old female admitted for respiratory failure.  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  Respiratory failure: Acute; with hypoxia.  Secondary to fluid overload.  The patient has missed dialysis.   Had hemodialysis 05/29/2018  off BiPAP Nephrology is following    CHF: Acute on chronic; systolic.    Hemodialysis done today  Reportedly, the patient makes some urine.  Resume torsemide.  Continue metoprolol   ESRD: On hemodialysis.    Follow-up with nephrology discussed with Dr. Zollie Scale  Continue Illene Regulus and Sensipar   Diabetes mellitus type 2: Last A1c 2 months ago 5.6; continue diet control.  .  CAD: Stable; continue aspirin, Plavix and Ranexa    COPD: Continue Spiriva and inhaled corticosteroid and long-acting bronchial agonist    DVT prophylaxis: Heparin     GI prophylaxis: Pantoprazole per home regimen     All the records are reviewed and case discussed with Care Management/Social Workerr. Management plans discussed with the patient, family and they are in agreement.  CODE STATUS: DNR   TOTAL TIME TAKING CARE OF THIS PATIENT: 33 minutes.   POSSIBLE D/C IN ? DAYS, DEPENDING ON CLINICAL CONDITION.  Note: This dictation was prepared with Dragon dictation along with smaller phrase technology. Any transcriptional errors that result from this process are unintentional.   Nicholes Mango M.D on 05/30/2018 at 3:52 PM  Between 7am to 6pm - Pager -  [phone] After 6pm go to [email or web address] - password [email or web address] [facility]  [facility]  Office  [phone]  CC: Primary care physician; Clinic, [facility]

## 2018-05-30 NOTE — Consult Note (Signed)
ANTICOAGULATION CONSULT NOTE - Initial Consult  Pharmacy Consult for heparin drip Indication: chest pain/ACS  Allergies  Allergen Reactions  . Nystatin Hives and Itching  . Sulfa Antibiotics Swelling, Hives and Rash  . Niaspan [Niacin] Other (See Comments) and Hives    Patient Measurements: Height: 5\' 6"  (167.6 cm) Weight: 227 lb 15.3 oz (103.4 kg) IBW/kg (Calculated) : 59.3 Heparin Dosing Weight: 82.6kg  Vital Signs: Temp: 98.5 F (36.9 C) (09/10 2000) Temp Source: Oral (09/10 2000) BP: 134/63 (09/11 0300) Pulse Rate: 78 (09/11 0300)  Labs: Recent Labs    05/29/18 0228 05/29/18 0750 05/29/18 1303 05/29/18 1841 05/29/18 2008 05/30/18 0019 05/30/18 0413  HGB 9.3* 9.1*  --   --   --   --  8.6*  HCT 28.3* 27.3*  --   --   --   --  25.7*  PLT 204 196  --   --   --   --  185  APTT  --   --   --   --  37*  --   --   LABPROT  --   --   --   --  13.2  --   --   INR  --   --   --   --  1.01  --   --   HEPARINUNFRC  --   --   --   --   --   --  0.29*  CREATININE 11.03* 11.19* 5.87*  --   --   --  7.71*  TROPONINI <0.03  --  0.05* 4.58*  --  8.99*  --     Estimated Creatinine Clearance: 8.6 mL/min (A) (by C-G formula based on SCr of 7.71 mg/dL (H)).   Medical History: Past Medical History:  Diagnosis Date  . Asthma   . CAD (coronary artery disease)   . Chronic lower back pain   . COPD (chronic obstructive pulmonary disease) ([place])   . Depression   . Diabetes mellitus without complication (HCC)    NIDD  . H/O angina pectoris   . H/O blood clots   . Hypercholesteremia   . Hypertension   . Left rotator cuff tear   . Myocardial infarction ([place])   . Obesity   . Renal insufficiency   . Stroke ([place])   . Vertigo, aural     Medications:  Scheduled:  . amLODipine  10 mg Oral Daily  . aspirin  81 mg Oral Daily  . atorvastatin  40 mg Oral QHS  . calcium carbonate  2 tablet Oral TID  . Chlorhexidine Gluconate Cloth  6 each Topical Q0600  . cinacalcet  30 mg Oral  QAC breakfast  . clopidogrel  75 mg Oral Daily  . docusate sodium  100 mg Oral BID  . doxycycline  100 mg Oral BID  . feeding supplement (NEPRO CARB STEADY)  237 mL Oral Q24H  . fluticasone  2 spray Each Nare BID  . fluticasone furoate-vilanterol  1 puff Inhalation Daily  . folic acid  1 mg Oral Daily  . heparin  1,200 Units Intravenous Once  . Influenza vac split quadrivalent PF  0.5 mL Intramuscular Tomorrow-1000  . lidocaine  1 patch Transdermal Q24H  . loratadine  10 mg Oral Daily  . lubiprostone  24 mcg Oral BID WC  . metoprolol succinate  100 mg Oral Q breakfast  . multivitamin  1 tablet Oral QHS  . nystatin  5 mL Mouth/Throat QID  . pantoprazole  40  mg Oral BID  . pneumococcal 23 valent vaccine  0.5 mL Intramuscular Tomorrow-1000  . polyethylene glycol  17 g Oral Daily  . ranolazine  500 mg Oral BID  . sevelamer carbonate  2.4 g Oral BID WC  . sodium zirconium cyclosilicate  10 g Oral TID  . tiotropium  1 capsule Inhalation Daily  . torsemide  100 mg Oral Daily  . varenicline  0.5 mg Oral Daily  . venlafaxine XR  37.5 mg Oral Q breakfast    Assessment: Patient is a 67 year old female presenting w/ chest pain, elevated troponin. Pharmacy consulted to dose heparin drip. Baseline CBC within acceptable limits. Add on APTT and INR ordered. No anticoagulation PTA and last subq heparin shot about 4.5 hr ago.  Goal of Therapy:  Heparin level 0.3-0.7 units/ml Monitor platelets by anticoagulation protocol: Yes   Plan:  09/11 @ 0500 HL 0.29 subtherapeutic. Will rebolus w/ heparin 1200 units IV x 1 and increase rate to 1100 units/hr and will recheck HL @ 1130. CBC has trended down, but trops have increased 0.05 >> 4.58 >> 8.99. Will continue to monitor.  Tobie Lords, PharmD, BCPS Clinical Pharmacist 05/30/2018

## 2018-05-30 NOTE — Progress Notes (Signed)
Pt returned to room after cardiac cath. Pt alert and oriented. Dressing right groin dry and intact with no bleeding swelling noted. Pt remains flat in bed at this time. Will continue to monitor closely.

## 2018-05-30 NOTE — Consult Note (Signed)
ANTICOAGULATION CONSULT NOTE - Initial Consult  Pharmacy Consult for heparin drip Indication: chest pain/ACS  Allergies  Allergen Reactions  . Nystatin Hives and Itching  . Sulfa Antibiotics Swelling, Hives and Rash  . Niaspan [Niacin] Other (See Comments) and Hives    Patient Measurements: Height: 5\' 6"  (167.6 cm) Weight: 227 lb 15.3 oz (103.4 kg) IBW/kg (Calculated) : 59.3 Heparin Dosing Weight: 82.6kg  Vital Signs: Temp: 98.3 F (36.8 C) (09/11 0930) Temp Source: Oral (09/11 0930) BP: 150/71 (09/11 1400) Pulse Rate: 75 (09/11 1200)  Labs: Recent Labs    05/29/18 0228 05/29/18 0750 05/29/18 1303 05/29/18 1841 05/29/18 2008 05/30/18 0019 05/30/18 0413 05/30/18 1319  HGB 9.3* 9.1*  --   --   --   --  8.6*  --   HCT 28.3* 27.3*  --   --   --   --  25.7*  --   PLT 204 196  --   --   --   --  185  --   APTT  --   --   --   --  37*  --   --   --   LABPROT  --   --   --   --  13.2  --   --   --   INR  --   --   --   --  1.01  --   --   --   HEPARINUNFRC  --   --   --   --   --   --  0.29* <0.10*  CREATININE 11.03* 11.19* 5.87*  --   --   --  7.71*  --   TROPONINI <0.03  --  0.05* 4.58*  --  8.99*  --   --     Estimated Creatinine Clearance: 8.6 mL/min (A) (by C-G formula based on SCr of 7.71 mg/dL (H)).   Medical History: Past Medical History:  Diagnosis Date  . Asthma   . CAD (coronary artery disease)   . Chronic lower back pain   . COPD (chronic obstructive pulmonary disease) ([place])   . Depression   . Diabetes mellitus without complication (HCC)    NIDD  . H/O angina pectoris   . H/O blood clots   . Hypercholesteremia   . Hypertension   . Left rotator cuff tear   . Myocardial infarction ([place])   . Obesity   . Renal insufficiency   . Stroke ([place])   . Vertigo, aural     Medications:  Scheduled:  . amLODipine  10 mg Oral Daily  . aspirin  81 mg Oral Daily  . atorvastatin  40 mg Oral QHS  . calcium carbonate  2 tablet Oral TID  . Chlorhexidine  Gluconate Cloth  6 each Topical Q0600  . cinacalcet  30 mg Oral QAC breakfast  . clopidogrel  75 mg Oral Daily  . docusate sodium  100 mg Oral BID  . doxycycline  100 mg Oral BID  . feeding supplement (NEPRO CARB STEADY)  237 mL Oral Q24H  . fluticasone  2 spray Each Nare BID  . fluticasone furoate-vilanterol  1 puff Inhalation Daily  . folic acid  1 mg Oral Daily  . Influenza vac split quadrivalent PF  0.5 mL Intramuscular Tomorrow-1000  . lidocaine  1 patch Transdermal Q24H  . loratadine  10 mg Oral Daily  . lubiprostone  24 mcg Oral BID WC  . metoprolol succinate  100 mg Oral Q breakfast  .  multivitamin  1 tablet Oral QHS  . nystatin  5 mL Mouth/Throat QID  . pantoprazole  40 mg Oral BID  . pneumococcal 23 valent vaccine  0.5 mL Intramuscular Tomorrow-1000  . polyethylene glycol  17 g Oral Daily  . ranolazine  500 mg Oral BID  . sevelamer carbonate  2.4 g Oral BID WC  . sodium zirconium cyclosilicate  10 g Oral TID  . tiotropium  1 capsule Inhalation Daily  . torsemide  100 mg Oral Daily  . varenicline  0.5 mg Oral Daily  . venlafaxine XR  37.5 mg Oral Q breakfast    Assessment: Patient is a 67 year old female presenting w/ chest pain, elevated troponin. Patient s/p cardiac cath 9/11. Pharmacy consulted to dose heparin drip. Cardiology to determine need for PCI.  Goal of Therapy:  Heparin level 0.3-0.7 units/ml Monitor platelets by anticoagulation protocol: Yes   Plan:  Per cardiology, plan is to continue heparin with no bolus Will restart heparin infusion at 1100 units/hr Check heparin level at 23:30 Continue to monitor H&H and platelets  Eman Rynders, PharmD Candidate 05/30/2018

## 2018-05-30 NOTE — Progress Notes (Signed)
Follow up - Critical Care Medicine Note  Patient Details:    Kimberly Shaffer is an 67 y.o. female presented to [facility] ED on 05/29/18 with c/o Respiratory Distress requiring BiPAP. She reports progressive shortness of breath for approximately 2 days. She denies wheezing, cough, fever/chills, or sick contacts. She does report swelling to bilateral LE.Pt has ESRD and is on HD (T, Th, S), and she reports that she missed her HD session on Saturday 05/26/18,with her last HD session completedon Thursday 05/24/18. patient was admitted to the intensive care unit on BiPAP with emergent hemodialysis  Lines, Airways, Drains: External Urinary Catheter (Active)  Collection Container Dedicated Suction Canister 05/30/2018  8:04 AM  Securement Method Other (Comment) 05/30/2018  8:04 AM    Anti-infectives:  Anti-infectives (From admission, onward)   Start     Dose/Rate Route Frequency Ordered Stop   05/29/18 1000  doxycycline (VIBRA-TABS) tablet 100 mg     100 mg Oral 2 times daily 05/29/18 0617        Microbiology: Results for orders placed or performed during the hospital encounter of 05/29/18  MRSA PCR Screening     Status: None   Collection Time: 05/29/18  6:51 AM  Result Value Ref Range Status   MRSA by PCR NEGATIVE NEGATIVE Final    Comment:        The GeneXpert MRSA Assay (FDA approved for NASAL specimens only), is one component of a comprehensive MRSA colonization surveillance program. It is not intended to diagnose MRSA infection nor to guide or monitor treatment for MRSA infections. Performed at [facility], [address], [place], [place] [address]     Studies: Dg Chest [facility] 1 View  Result Date: 05/29/2018 CLINICAL DATA:  Respiratory distress.  Missed dialysis 2 days ago. EXAM: PORTABLE CHEST 1 VIEW COMPARISON:  04/18/2018, chest CT 12/17/2015 FINDINGS: Unchanged cardiomegaly and mediastinal contours. Moderate pulmonary edema, increased from prior exam. No confluent  airspace disease. No large pleural effusion or pneumothorax. 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 IMPRESSION: Moderate pulmonary edema with unchanged cardiomegaly. Electronically Signed   By: Keith Rake M.D.   On: 05/29/2018 03:00    Consults: Treatment Team:  Anthonette Legato, MD   Subjective:    Overnight Issues: Postdialysis patient developed acute onset of chest pain with ST segment changes, was seen by cardiology, was started on nitroglycerin and heparin last evening pending cardia catheterization this morning  Objective:  Vital signs for last 24 hours: Temp:  [98.3 F (36.8 C)-98.8 F (37.1 C)] 98.3 F (36.8 C) (09/11 0800) Pulse Rate:  [78-96] 80 (09/11 0800) Resp:  [15-37] 15 (09/11 0800) BP: (106-160)/(48-110) 144/83 (09/11 0800) SpO2:  [84 %-100 %] 97 % (09/11 0800) Weight:  [103.4 kg] 103.4 kg (09/10 1100)  Hemodynamic parameters for last 24 hours:    Intake/Output from previous day: 09/10 0701 - 09/11 0700 In: 280.4 [I.V.:280.4] Out: 2009   Intake/Output this shift: No intake/output data recorded.  Vent settings for last 24 hours:    Physical Exam:  Patient is awake, alert in no acute distress Vital signs: Please see the above listed vital signs HEENT, trachea midline, no thyromegaly appreciated Cardiovascular: Regular rate and rhythm Pulmonary: Clear to auscultation Abdominal: Positive bowel sounds, soft exam Extremities: No clubbing, cyanosis or edema Neurologic: Moves all extremities  Assessment/Plan:   Patient with acute hypoxemic respiratory failure secondary to missed hemodialysis and flash pulmonary edema. Status post hemodialysis yesterday.  Being followed by nephrology. Patient has an underlying history of COPD is presently on Spiriva and Breo  Chest pain with ST segment changes. Pending cardiac catheterization this morning, on nitroglycerin and heparin infusion.  Troponin increased to 8.99  Anemia. No evidence of active bleeding   Critical Care Total Time 35 minutes  Kimberly Shaffer 05/30/2018  *Care during the described time interval was provided by me and/or other providers on the critical care team.  I have reviewed this patient's available data, including medical history, events of note, physical examination and test results as part of my evaluation.

## 2018-05-30 NOTE — Progress Notes (Signed)
Pt has been accepted at [facility] per transport center-room 7209. Handoff report called to Wandra Feinstein, RN at [facility] for room [phone]. Family in and aware of transfer. Handoff report given to [employer] ground transport via telephone.

## 2018-05-30 NOTE — Progress Notes (Signed)
[facility] Encounter Note  Patient: Kimberly Shaffer / Admit Date: 05/29/2018 / Date of Encounter: 05/30/2018, 12:55 PM   Subjective: The patient has done well overnight without evidence of significant new chest discomfort.  Troponin elevation to 8.9 consistent with non-ST elevation myocardial infarction.  Appropriate medication management including heparin nitrates beta-blocker oxygen and high intensity cholesterol therapy helping Cardiac catheterization showing inferior hypokinesis with ejection fraction of 40% Occluded right coronary artery with collaterals from the left system unchanged from before Patent stent of proximal LAD with new mid LAD stenosis of 85% likely culprit lesion causing non-ST elevation myocardial infarction Moderate atherosclerosis and patent stent of circumflex and obtuse marginal artery Review of Systems: Positive for: Shortness of breath Negative for: Vision change, hearing change, syncope, dizziness, nausea, vomiting,diarrhea, bloody stool, stomach pain, cough, congestion, diaphoresis, urinary frequency, urinary pain,skin lesions, skin rashes Others previously listed  Objective: Telemetry: Normal sinus rhythm Physical Exam: Blood pressure 133/72, pulse 75, temperature 98.3 F (36.8 C), temperature source Oral, resp. rate 14, height 5\' 6"  (1.676 m), weight 103.4 kg, SpO2 97 %. Body mass index is 36.79 kg/m. General: Well developed, well nourished, in no acute distress. Head: Normocephalic, atraumatic, sclera non-icteric, no xanthomas, nares are without discharge. Neck: No apparent masses Lungs: Normal respirations with no wheezes, no rhonchi, no rales , few crackles   Heart: Regular rate and rhythm, normal S1 S2, no murmur, no rub, no gallop, PMI is normal size and placement, carotid upstroke normal without bruit, jugular venous pressure normal Abdomen: Soft, non-tender, non-distended with normoactive bowel sounds. No hepatosplenomegaly.  Abdominal aorta is normal size without bruit Extremities: Trace edema, no clubbing, no cyanosis, no ulcers,  Peripheral: 2+ radial, 2+ femoral, 2+ dorsal pedal pulses Neuro: Alert and oriented. Moves all extremities spontaneously. Psych:  Responds to questions appropriately with a normal affect.   Intake/Output Summary (Last 24 hours) at 05/30/2018 1255 Last data filed at 05/30/2018 1100 Gross per 24 hour  Intake 400.38 ml  Output -  Net 400.38 ml    Inpatient Medications:  . amLODipine  10 mg Oral Daily  . aspirin  81 mg Oral Daily  . atorvastatin  40 mg Oral QHS  . calcium carbonate  2 tablet Oral TID  . Chlorhexidine Gluconate Cloth  6 each Topical Q0600  . cinacalcet  30 mg Oral QAC breakfast  . clopidogrel  75 mg Oral Daily  . docusate sodium  100 mg Oral BID  . doxycycline  100 mg Oral BID  . feeding supplement (NEPRO CARB STEADY)  237 mL Oral Q24H  . fluticasone  2 spray Each Nare BID  . fluticasone furoate-vilanterol  1 puff Inhalation Daily  . folic acid  1 mg Oral Daily  . Influenza vac split quadrivalent PF  0.5 mL Intramuscular Tomorrow-1000  . lidocaine  1 patch Transdermal Q24H  . loratadine  10 mg Oral Daily  . lubiprostone  24 mcg Oral BID WC  . metoprolol succinate  100 mg Oral Q breakfast  . multivitamin  1 tablet Oral QHS  . nystatin  5 mL Mouth/Throat QID  . pantoprazole  40 mg Oral BID  . pneumococcal 23 valent vaccine  0.5 mL Intramuscular Tomorrow-1000  . polyethylene glycol  17 g Oral Daily  . ranolazine  500 mg Oral BID  . sevelamer carbonate  2.4 g Oral BID WC  . sodium zirconium cyclosilicate  10 g Oral TID  . tiotropium  1 capsule Inhalation Daily  . torsemide  100 mg Oral Daily  . varenicline  0.5 mg Oral Daily  . venlafaxine XR  37.5 mg Oral Q breakfast   Infusions:  . sodium chloride    . sodium chloride    . heparin Stopped (05/30/18 0942)  . nitroGLYCERIN 50 mcg/min (05/30/18 0700)    Labs: Recent Labs    05/29/18 0750  05/29/18 0751 05/29/18 1303 05/30/18 0413  NA 132*  --  135 135  K >7.5*  --  4.0 4.4  CL 99  --  94* 94*  CO2 18*  --  26 29  GLUCOSE 113*  --  107* 101*  BUN 77*  --  31* 43*  CREATININE 11.19*  --  5.87* 7.71*  CALCIUM 8.1*  --  8.6* 8.5*  MG  --  2.4  --  2.1  PHOS 9.7*  --   --   --    Recent Labs    05/29/18 0228  AST 15  ALT 9  ALKPHOS 59  BILITOT 0.9  PROT 8.3*  ALBUMIN 3.8   Recent Labs    05/29/18 0228 05/29/18 0750 05/30/18 0413  WBC 8.3 7.4 7.9  NEUTROABS 5.8  --   --   HGB 9.3* 9.1* 8.6*  HCT 28.3* 27.3* 25.7*  MCV 92.4 92.0 90.9  PLT 204 196 185   Recent Labs    05/29/18 0228 05/29/18 1303 05/29/18 1841 05/30/18 0019  TROPONINI <0.03 0.05* 4.58* 8.99*   Invalid input(s): POCBNP No results for input(s): HGBA1C in the last 72 hours.   Weights: Filed Weights   05/29/18 0720 05/29/18 1100 05/30/18 0930  Weight: 104 kg 103.4 kg 103.4 kg     Radiology/Studies:  Dg Chest Port 1 View  Result Date: 05/29/2018 CLINICAL DATA:  Respiratory distress.  Missed dialysis 2 days ago. EXAM: PORTABLE CHEST 1 VIEW COMPARISON:  04/18/2018, chest CT 12/17/2015 FINDINGS: Unchanged cardiomegaly and mediastinal contours. Moderate pulmonary edema, increased from prior exam. No confluent airspace disease. No large pleural effusion or pneumothorax. 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 IMPRESSION: Moderate pulmonary edema with unchanged cardiomegaly. Electronically Signed   By: Keith Rake M.D.   On: 05/29/2018 03:00     Assessment and Recommendation  67 y.o. female with end-stage renal disease sleep apnea essential hypertension mixed hyperlipidemia diabetes with complication with complicated PCI and stent placement of left anterior descending artery 2 months prior with new 85% mid LAD stenosis culprit lesion causing non-ST elevation myocardial infarction 1.  Continue dual antiplatelet  therapy as well as heparin for further risk reduction of complication with non-ST elevation myocardial infarction 2.  Further consideration of PCI and stent placement of complicated mid LAD stenosis is likely culprit of non-ST elevation myocardial infarction 3.  High intensity cholesterol therapy 4.  Hypertension control with beta-blocker and calcium channel blocker also helping angina 5.  Continue dialysis after cardiac catheterization 6.  Further treatment options after above  Signed, Serafina Royals M.D. FACC

## 2018-06-19 ENCOUNTER — Emergency Department: Payer: Medicare Other

## 2018-06-19 ENCOUNTER — Encounter: Payer: Self-pay | Admitting: Emergency Medicine

## 2018-06-19 ENCOUNTER — Other Ambulatory Visit: Payer: Self-pay

## 2018-06-19 ENCOUNTER — Inpatient Hospital Stay
Admission: EM | Admit: 2018-06-19 | Discharge: 2018-06-21 | DRG: 291 | Disposition: A | Discharge status: Home Health | Payer: Medicare Other | Attending: Internal Medicine | Admitting: Internal Medicine

## 2018-06-19 DIAGNOSIS — E778 Other disorders of glycoprotein metabolism: Secondary | ICD-10-CM | POA: Diagnosis present

## 2018-06-19 DIAGNOSIS — J810 Acute pulmonary edema: Secondary | ICD-10-CM

## 2018-06-19 DIAGNOSIS — E1122 Type 2 diabetes mellitus with diabetic chronic kidney disease: Secondary | ICD-10-CM | POA: Diagnosis present

## 2018-06-19 DIAGNOSIS — Z23 Encounter for immunization: Secondary | ICD-10-CM

## 2018-06-19 DIAGNOSIS — J9602 Acute respiratory failure with hypercapnia: Secondary | ICD-10-CM

## 2018-06-19 DIAGNOSIS — R7989 Other specified abnormal findings of blood chemistry: Secondary | ICD-10-CM | POA: Diagnosis present

## 2018-06-19 DIAGNOSIS — N186 End stage renal disease: Secondary | ICD-10-CM | POA: Diagnosis present

## 2018-06-19 DIAGNOSIS — D638 Anemia in other chronic diseases classified elsewhere: Secondary | ICD-10-CM | POA: Diagnosis present

## 2018-06-19 DIAGNOSIS — Z86718 Personal history of other venous thrombosis and embolism: Secondary | ICD-10-CM

## 2018-06-19 DIAGNOSIS — J9621 Acute and chronic respiratory failure with hypoxia: Secondary | ICD-10-CM | POA: Diagnosis present

## 2018-06-19 DIAGNOSIS — J9600 Acute respiratory failure, unspecified whether with hypoxia or hypercapnia: Secondary | ICD-10-CM | POA: Diagnosis present

## 2018-06-19 DIAGNOSIS — J9622 Acute and chronic respiratory failure with hypercapnia: Secondary | ICD-10-CM | POA: Diagnosis present

## 2018-06-19 DIAGNOSIS — Z66 Do not resuscitate: Secondary | ICD-10-CM | POA: Diagnosis present

## 2018-06-19 DIAGNOSIS — N2581 Secondary hyperparathyroidism of renal origin: Secondary | ICD-10-CM | POA: Diagnosis present

## 2018-06-19 DIAGNOSIS — E669 Obesity, unspecified: Secondary | ICD-10-CM | POA: Diagnosis present

## 2018-06-19 DIAGNOSIS — Z888 Allergy status to other drugs, medicaments and biological substances status: Secondary | ICD-10-CM

## 2018-06-19 DIAGNOSIS — Z96653 Presence of artificial knee joint, bilateral: Secondary | ICD-10-CM | POA: Diagnosis present

## 2018-06-19 DIAGNOSIS — T501X6A Underdosing of loop [high-ceiling] diuretics, initial encounter: Secondary | ICD-10-CM | POA: Diagnosis present

## 2018-06-19 DIAGNOSIS — Z951 Presence of aortocoronary bypass graft: Secondary | ICD-10-CM

## 2018-06-19 DIAGNOSIS — I132 Hypertensive heart and chronic kidney disease with heart failure and with stage 5 chronic kidney disease, or end stage renal disease: Principal | ICD-10-CM | POA: Diagnosis present

## 2018-06-19 DIAGNOSIS — Z7902 Long term (current) use of antithrombotics/antiplatelets: Secondary | ICD-10-CM

## 2018-06-19 DIAGNOSIS — H81319 Aural vertigo, unspecified ear: Secondary | ICD-10-CM | POA: Diagnosis present

## 2018-06-19 DIAGNOSIS — Z8673 Personal history of transient ischemic attack (TIA), and cerebral infarction without residual deficits: Secondary | ICD-10-CM

## 2018-06-19 DIAGNOSIS — E8809 Other disorders of plasma-protein metabolism, not elsewhere classified: Secondary | ICD-10-CM | POA: Diagnosis present

## 2018-06-19 DIAGNOSIS — G8929 Other chronic pain: Secondary | ICD-10-CM | POA: Diagnosis present

## 2018-06-19 DIAGNOSIS — Z992 Dependence on renal dialysis: Secondary | ICD-10-CM

## 2018-06-19 DIAGNOSIS — E1165 Type 2 diabetes mellitus with hyperglycemia: Secondary | ICD-10-CM | POA: Diagnosis present

## 2018-06-19 DIAGNOSIS — Z955 Presence of coronary angioplasty implant and graft: Secondary | ICD-10-CM

## 2018-06-19 DIAGNOSIS — J9601 Acute respiratory failure with hypoxia: Secondary | ICD-10-CM

## 2018-06-19 DIAGNOSIS — E875 Hyperkalemia: Secondary | ICD-10-CM | POA: Diagnosis present

## 2018-06-19 DIAGNOSIS — I5043 Acute on chronic combined systolic (congestive) and diastolic (congestive) heart failure: Secondary | ICD-10-CM | POA: Diagnosis present

## 2018-06-19 DIAGNOSIS — J441 Chronic obstructive pulmonary disease with (acute) exacerbation: Secondary | ICD-10-CM | POA: Diagnosis present

## 2018-06-19 DIAGNOSIS — Z79899 Other long term (current) drug therapy: Secondary | ICD-10-CM

## 2018-06-19 DIAGNOSIS — I248 Other forms of acute ischemic heart disease: Secondary | ICD-10-CM | POA: Diagnosis present

## 2018-06-19 DIAGNOSIS — D631 Anemia in chronic kidney disease: Secondary | ICD-10-CM | POA: Diagnosis present

## 2018-06-19 DIAGNOSIS — Z9981 Dependence on supplemental oxygen: Secondary | ICD-10-CM | POA: Diagnosis not present

## 2018-06-19 DIAGNOSIS — M545 Low back pain: Secondary | ICD-10-CM | POA: Diagnosis present

## 2018-06-19 DIAGNOSIS — Z882 Allergy status to sulfonamides status: Secondary | ICD-10-CM

## 2018-06-19 DIAGNOSIS — Z792 Long term (current) use of antibiotics: Secondary | ICD-10-CM

## 2018-06-19 DIAGNOSIS — I25119 Atherosclerotic heart disease of native coronary artery with unspecified angina pectoris: Secondary | ICD-10-CM | POA: Diagnosis present

## 2018-06-19 DIAGNOSIS — F329 Major depressive disorder, single episode, unspecified: Secondary | ICD-10-CM | POA: Diagnosis present

## 2018-06-19 DIAGNOSIS — K219 Gastro-esophageal reflux disease without esophagitis: Secondary | ICD-10-CM | POA: Diagnosis present

## 2018-06-19 DIAGNOSIS — Z6837 Body mass index (BMI) 37.0-37.9, adult: Secondary | ICD-10-CM

## 2018-06-19 DIAGNOSIS — Z7951 Long term (current) use of inhaled steroids: Secondary | ICD-10-CM

## 2018-06-19 DIAGNOSIS — E785 Hyperlipidemia, unspecified: Secondary | ICD-10-CM | POA: Diagnosis present

## 2018-06-19 DIAGNOSIS — Z7982 Long term (current) use of aspirin: Secondary | ICD-10-CM

## 2018-06-19 DIAGNOSIS — F17210 Nicotine dependence, cigarettes, uncomplicated: Secondary | ICD-10-CM | POA: Diagnosis present

## 2018-06-19 DIAGNOSIS — I252 Old myocardial infarction: Secondary | ICD-10-CM

## 2018-06-19 LAB — TROPONIN I
Troponin I: 0.04 ng/mL (ref <0.03)
Troponin I: 0.05 ng/mL (ref <0.03)
Troponin I: 0.05 ng/mL (ref <0.03)

## 2018-06-19 LAB — COMPREHENSIVE METABOLIC PANEL
ALBUMIN: 3.8 g/dL (ref 3.5–5.0)
ALK PHOS: 65 U/L (ref 38–126)
ALT: 11 U/L (ref 0–44)
ANION GAP: 12 (ref 5–15)
AST: 22 U/L (ref 15–41)
BILIRUBIN TOTAL: 0.9 mg/dL (ref 0.3–1.2)
BUN: 46 mg/dL — ABNORMAL HIGH (ref 8–23)
CALCIUM: 8.6 mg/dL — AB (ref 8.9–10.3)
CO2: 20 mmol/L — ABNORMAL LOW (ref 22–32)
Chloride: 103 mmol/L (ref 98–111)
Creatinine, Ser: 7.82 mg/dL — ABNORMAL HIGH (ref 0.44–1.00)
GFR calc non Af Amer: 5 mL/min — ABNORMAL LOW (ref >60)
GFR, EST AFRICAN AMERICAN: 5 mL/min — AB (ref >60)
GLUCOSE: 208 mg/dL — AB (ref 70–99)
POTASSIUM: 4.6 mmol/L (ref 3.5–5.1)
SODIUM: 135 mmol/L (ref 135–145)
TOTAL PROTEIN: 8.3 g/dL — AB (ref 6.5–8.1)

## 2018-06-19 LAB — URINALYSIS, COMPLETE (UACMP) WITH MICROSCOPIC
Bilirubin Urine: NEGATIVE
Glucose, UA: 150 mg/dL — AB
HGB URINE DIPSTICK: NEGATIVE
Ketones, ur: NEGATIVE mg/dL
LEUKOCYTES UA: NEGATIVE
NITRITE: NEGATIVE
PH: 7 (ref 5.0–8.0)
Protein, ur: 100 mg/dL — AB
SPECIFIC GRAVITY, URINE: 1.011 (ref 1.005–1.030)

## 2018-06-19 LAB — CBC WITH DIFFERENTIAL/PLATELET
BASOS ABS: 0.1 10*3/uL (ref 0–0.1)
BASOS PCT: 1 %
Eosinophils Absolute: 0.1 10*3/uL (ref 0–0.7)
Eosinophils Relative: 1 %
HEMATOCRIT: 27.2 % — AB (ref 35.0–47.0)
HEMOGLOBIN: 8.9 g/dL — AB (ref 12.0–16.0)
Lymphocytes Relative: 22 %
Lymphs Abs: 2.3 10*3/uL (ref 1.0–3.6)
MCH: 31.4 pg (ref 26.0–34.0)
MCHC: 32.6 g/dL (ref 32.0–36.0)
MCV: 96.4 fL (ref 80.0–100.0)
Monocytes Absolute: 1 10*3/uL — ABNORMAL HIGH (ref 0.2–0.9)
Monocytes Relative: 10 %
NEUTROS ABS: 6.9 10*3/uL — AB (ref 1.4–6.5)
NEUTROS PCT: 66 %
Platelets: 185 10*3/uL (ref 150–440)
RBC: 2.82 MIL/uL — ABNORMAL LOW (ref 3.80–5.20)
RDW: 20 % — ABNORMAL HIGH (ref 11.5–14.5)
WBC: 10.4 10*3/uL (ref 3.6–11.0)

## 2018-06-19 LAB — BLOOD GAS, VENOUS
Acid-base deficit: 7.8 mmol/L — ABNORMAL HIGH (ref 0.0–2.0)
Bicarbonate: 21.2 mmol/L (ref 20.0–28.0)
O2 SAT: 69.6 %
PH VEN: 7.17 — AB (ref 7.250–7.430)
Patient temperature: 37
pCO2, Ven: 58 mmHg (ref 44.0–60.0)
pO2, Ven: 47 mmHg — ABNORMAL HIGH (ref 32.0–45.0)

## 2018-06-19 LAB — GLUCOSE, CAPILLARY
GLUCOSE-CAPILLARY: 120 mg/dL — AB (ref 70–99)
Glucose-Capillary: 227 mg/dL — ABNORMAL HIGH (ref 70–99)

## 2018-06-19 LAB — LACTIC ACID, PLASMA
LACTIC ACID, VENOUS: 2.1 mmol/L — AB (ref 0.5–1.9)
Lactic Acid, Venous: 0.9 mmol/L (ref 0.5–1.9)

## 2018-06-19 LAB — BRAIN NATRIURETIC PEPTIDE: B NATRIURETIC PEPTIDE 5: 2901 pg/mL — AB (ref 0.0–100.0)

## 2018-06-19 LAB — PHOSPHORUS: Phosphorus: 4.6 mg/dL (ref 2.5–4.6)

## 2018-06-19 LAB — PROCALCITONIN: Procalcitonin: 0.18 ng/mL

## 2018-06-19 MED ORDER — INFLUENZA VAC SPLIT HIGH-DOSE 0.5 ML IM SUSY
0.5000 mL | PREFILLED_SYRINGE | INTRAMUSCULAR | Status: AC
  Administered 2018-06-20: 0.5 mL via INTRAMUSCULAR
  Filled 2018-06-19: qty 0.5

## 2018-06-19 MED ORDER — FUROSEMIDE 10 MG/ML IJ SOLN
INTRAMUSCULAR | Status: AC
  Filled 2018-06-19: qty 4

## 2018-06-19 MED ORDER — ASPIRIN 81 MG PO CHEW
81.0000 mg | CHEWABLE_TABLET | Freq: Every day | ORAL | Status: DC
  Administered 2018-06-19 – 2018-06-21 (×3): 81 mg via ORAL
  Filled 2018-06-19 (×3): qty 1

## 2018-06-19 MED ORDER — INSULIN ASPART 100 UNIT/ML ~~LOC~~ SOLN: 0.0000 [IU] | Freq: Every day | SUBCUTANEOUS | Status: DC

## 2018-06-19 MED ORDER — MECLIZINE HCL 25 MG PO TABS
25.0000 mg | ORAL_TABLET | Freq: Three times a day (TID) | ORAL | Status: DC | PRN
  Filled 2018-06-19: qty 1

## 2018-06-19 MED ORDER — VARENICLINE TARTRATE 0.5 MG PO TABS
0.5000 mg | ORAL_TABLET | Freq: Every day | ORAL | Status: DC
  Filled 2018-06-19: qty 1

## 2018-06-19 MED ORDER — DOXYCYCLINE HYCLATE 100 MG PO TABS: 100.0000 mg | ORAL_TABLET | Freq: Two times a day (BID) | ORAL | Status: DC

## 2018-06-19 MED ORDER — HEPARIN SODIUM (PORCINE) 5000 UNIT/ML IJ SOLN
5000.0000 [IU] | Freq: Three times a day (TID) | INTRAMUSCULAR | Status: DC
  Administered 2018-06-19 – 2018-06-21 (×5): 5000 [IU] via SUBCUTANEOUS
  Filled 2018-06-19 (×5): qty 1

## 2018-06-19 MED ORDER — TIOTROPIUM BROMIDE MONOHYDRATE 18 MCG IN CAPS
1.0000 | ORAL_CAPSULE | Freq: Every day | RESPIRATORY_TRACT | Status: DC
  Administered 2018-06-20 – 2018-06-21 (×2): 18 ug via RESPIRATORY_TRACT
  Filled 2018-06-19: qty 5

## 2018-06-19 MED ORDER — NITROGLYCERIN 0.4 MG SL SUBL
0.4000 mg | SUBLINGUAL_TABLET | SUBLINGUAL | Status: DC | PRN
  Administered 2018-06-19: 0.4 mg via SUBLINGUAL
  Filled 2018-06-19: qty 1

## 2018-06-19 MED ORDER — ATORVASTATIN CALCIUM 20 MG PO TABS
40.0000 mg | ORAL_TABLET | Freq: Every day | ORAL | Status: DC
  Administered 2018-06-19 – 2018-06-20 (×2): 40 mg via ORAL
  Filled 2018-06-19 (×2): qty 2

## 2018-06-19 MED ORDER — SODIUM CHLORIDE 0.9% FLUSH
3.0000 mL | Freq: Two times a day (BID) | INTRAVENOUS | Status: DC
  Administered 2018-06-19 – 2018-06-21 (×4): 3 mL via INTRAVENOUS

## 2018-06-19 MED ORDER — SEVELAMER CARBONATE 2.4 G PO PACK
2.4000 g | PACK | Freq: Two times a day (BID) | ORAL | Status: DC
  Administered 2018-06-19 – 2018-06-20 (×2): 2.4 g via ORAL
  Filled 2018-06-19 (×4): qty 1

## 2018-06-19 MED ORDER — ORAL CARE MOUTH RINSE
15.0000 mL | Freq: Two times a day (BID) | OROMUCOSAL | Status: DC
  Administered 2018-06-20 (×2): 15 mL via OROMUCOSAL

## 2018-06-19 MED ORDER — SODIUM ZIRCONIUM CYCLOSILICATE 5 G PO PACK
10.0000 g | PACK | Freq: Three times a day (TID) | ORAL | Status: DC
  Filled 2018-06-19 (×3): qty 2

## 2018-06-19 MED ORDER — NYSTATIN 100000 UNIT/ML MT SUSP
5.0000 mL | Freq: Four times a day (QID) | OROMUCOSAL | Status: DC
  Administered 2018-06-19 – 2018-06-21 (×7): 500000 [IU] via OROMUCOSAL
  Filled 2018-06-19 (×10): qty 5

## 2018-06-19 MED ORDER — VENLAFAXINE HCL ER 37.5 MG PO CP24
37.5000 mg | ORAL_CAPSULE | Freq: Every day | ORAL | Status: DC
  Administered 2018-06-19 – 2018-06-21 (×3): 37.5 mg via ORAL
  Filled 2018-06-19 (×3): qty 1

## 2018-06-19 MED ORDER — SENNOSIDES-DOCUSATE SODIUM 8.6-50 MG PO TABS: 1.0000 | ORAL_TABLET | Freq: Every evening | ORAL | Status: DC | PRN

## 2018-06-19 MED ORDER — CLOPIDOGREL BISULFATE 75 MG PO TABS
75.0000 mg | ORAL_TABLET | Freq: Every day | ORAL | Status: DC
  Administered 2018-06-20 – 2018-06-21 (×2): 75 mg via ORAL
  Filled 2018-06-19 (×2): qty 1

## 2018-06-19 MED ORDER — METHYLPREDNISOLONE SODIUM SUCC 125 MG IJ SOLR
125.0000 mg | Freq: Once | INTRAMUSCULAR | Status: AC
  Administered 2018-06-19: 125 mg via INTRAVENOUS
  Filled 2018-06-19: qty 2

## 2018-06-19 MED ORDER — FLUTICASONE FUROATE-VILANTEROL 200-25 MCG/INH IN AEPB
1.0000 | INHALATION_SPRAY | Freq: Every day | RESPIRATORY_TRACT | Status: DC
  Administered 2018-06-19 – 2018-06-21 (×3): 1 via RESPIRATORY_TRACT
  Filled 2018-06-19: qty 28

## 2018-06-19 MED ORDER — SODIUM CHLORIDE 0.9% FLUSH: 3.0000 mL | INTRAVENOUS | Status: DC | PRN

## 2018-06-19 MED ORDER — FUROSEMIDE 10 MG/ML IJ SOLN
80.0000 mg | Freq: Once | INTRAMUSCULAR | Status: AC
  Administered 2018-06-19: 80 mg via INTRAVENOUS
  Filled 2018-06-19: qty 8

## 2018-06-19 MED ORDER — FOLIC ACID 1 MG PO TABS
1.0000 mg | ORAL_TABLET | Freq: Every day | ORAL | Status: DC
  Administered 2018-06-19 – 2018-06-21 (×3): 1 mg via ORAL
  Filled 2018-06-19 (×3): qty 1

## 2018-06-19 MED ORDER — METHYLPREDNISOLONE SODIUM SUCC 125 MG IJ SOLR
60.0000 mg | Freq: Two times a day (BID) | INTRAMUSCULAR | Status: AC
  Administered 2018-06-20 – 2018-06-21 (×3): 60 mg via INTRAVENOUS
  Filled 2018-06-19 (×3): qty 2

## 2018-06-19 MED ORDER — LUBIPROSTONE 24 MCG PO CAPS
24.0000 ug | ORAL_CAPSULE | Freq: Two times a day (BID) | ORAL | Status: DC
  Administered 2018-06-20 (×2): 24 ug via ORAL
  Filled 2018-06-19 (×6): qty 1

## 2018-06-19 MED ORDER — PANTOPRAZOLE SODIUM 40 MG PO TBEC
40.0000 mg | DELAYED_RELEASE_TABLET | Freq: Two times a day (BID) | ORAL | Status: DC
  Administered 2018-06-19 – 2018-06-21 (×5): 40 mg via ORAL
  Filled 2018-06-19 (×5): qty 1

## 2018-06-19 MED ORDER — IPRATROPIUM-ALBUTEROL 0.5-2.5 (3) MG/3ML IN SOLN
3.0000 mL | RESPIRATORY_TRACT | Status: DC
  Administered 2018-06-19 – 2018-06-20 (×5): 3 mL via RESPIRATORY_TRACT
  Filled 2018-06-19 (×6): qty 3

## 2018-06-19 MED ORDER — METOPROLOL SUCCINATE ER 100 MG PO TB24
100.0000 mg | ORAL_TABLET | Freq: Every day | ORAL | Status: DC
  Administered 2018-06-19 – 2018-06-20 (×2): 100 mg via ORAL
  Filled 2018-06-19: qty 1
  Filled 2018-06-19: qty 2

## 2018-06-19 MED ORDER — BISACODYL 5 MG PO TBEC
5.0000 mg | DELAYED_RELEASE_TABLET | Freq: Every day | ORAL | Status: DC | PRN
  Filled 2018-06-19: qty 1

## 2018-06-19 MED ORDER — RENA-VITE PO TABS
1.0000 | ORAL_TABLET | Freq: Every day | ORAL | Status: DC
  Administered 2018-06-19 – 2018-06-20 (×2): 1 via ORAL
  Filled 2018-06-19 (×4): qty 1

## 2018-06-19 MED ORDER — NICOTINE 21 MG/24HR TD PT24
21.0000 mg | MEDICATED_PATCH | Freq: Every day | TRANSDERMAL | Status: DC
  Administered 2018-06-19 – 2018-06-21 (×3): 21 mg via TRANSDERMAL
  Filled 2018-06-19 (×3): qty 1

## 2018-06-19 MED ORDER — AZITHROMYCIN 250 MG PO TABS
500.0000 mg | ORAL_TABLET | Freq: Every day | ORAL | Status: DC
  Administered 2018-06-20: 500 mg via ORAL
  Filled 2018-06-19: qty 2

## 2018-06-19 MED ORDER — INSULIN ASPART 100 UNIT/ML ~~LOC~~ SOLN
0.0000 [IU] | Freq: Three times a day (TID) | SUBCUTANEOUS | Status: DC
  Administered 2018-06-19: 5 [IU] via SUBCUTANEOUS
  Administered 2018-06-21: 2 [IU] via SUBCUTANEOUS
  Filled 2018-06-19 (×2): qty 1

## 2018-06-19 MED ORDER — SODIUM CHLORIDE 0.9 % IV SOLN
500.0000 mg | Freq: Every day | INTRAVENOUS | Status: DC
  Administered 2018-06-19: 500 mg via INTRAVENOUS
  Filled 2018-06-19: qty 500

## 2018-06-19 MED ORDER — EPOETIN ALFA 10000 UNIT/ML IJ SOLN
4000.0000 [IU] | INTRAMUSCULAR | Status: DC
  Administered 2018-06-19 – 2018-06-21 (×2): 4000 [IU] via SUBCUTANEOUS

## 2018-06-19 MED ORDER — ACETAMINOPHEN 325 MG PO TABS
650.0000 mg | ORAL_TABLET | ORAL | Status: DC | PRN
  Administered 2018-06-19: 650 mg via ORAL

## 2018-06-19 MED ORDER — NEPRO/CARBSTEADY PO LIQD
237.0000 mL | ORAL | Status: DC
  Administered 2018-06-19 – 2018-06-21 (×3): 237 mL via ORAL

## 2018-06-19 MED ORDER — ONDANSETRON HCL 4 MG/2ML IJ SOLN: 4.0000 mg | Freq: Four times a day (QID) | INTRAMUSCULAR | Status: DC | PRN

## 2018-06-19 MED ORDER — IPRATROPIUM-ALBUTEROL 0.5-2.5 (3) MG/3ML IN SOLN
3.0000 mL | Freq: Once | RESPIRATORY_TRACT | Status: AC
  Administered 2018-06-19: 3 mL via RESPIRATORY_TRACT
  Filled 2018-06-19: qty 6

## 2018-06-19 MED ORDER — CINACALCET HCL 30 MG PO TABS
30.0000 mg | ORAL_TABLET | Freq: Every day | ORAL | Status: DC
  Administered 2018-06-19 – 2018-06-21 (×3): 30 mg via ORAL
  Filled 2018-06-19 (×3): qty 1

## 2018-06-19 MED ORDER — OXYCODONE HCL 5 MG PO TABS
10.0000 mg | ORAL_TABLET | Freq: Four times a day (QID) | ORAL | Status: DC | PRN
  Administered 2018-06-19 – 2018-06-20 (×2): 10 mg via ORAL
  Filled 2018-06-19 (×2): qty 2

## 2018-06-19 MED ORDER — IPRATROPIUM-ALBUTEROL 0.5-2.5 (3) MG/3ML IN SOLN
3.0000 mL | Freq: Once | RESPIRATORY_TRACT | Status: AC
  Administered 2018-06-19: 3 mL via RESPIRATORY_TRACT

## 2018-06-19 MED ORDER — POLYETHYLENE GLYCOL 3350 17 G PO PACK
17.0000 g | PACK | Freq: Every day | ORAL | Status: DC
  Administered 2018-06-19 – 2018-06-21 (×3): 17 g via ORAL
  Filled 2018-06-19 (×3): qty 1

## 2018-06-19 MED ORDER — PREDNISONE 20 MG PO TABS: 40.0000 mg | ORAL_TABLET | Freq: Every day | ORAL | Status: DC

## 2018-06-19 MED ORDER — SODIUM CHLORIDE 0.9 % IV SOLN
250.0000 mL | INTRAVENOUS | Status: DC | PRN
  Administered 2018-06-19: 250 mL via INTRAVENOUS

## 2018-06-19 MED ORDER — RANOLAZINE ER 500 MG PO TB12
500.0000 mg | ORAL_TABLET | Freq: Two times a day (BID) | ORAL | Status: DC
  Administered 2018-06-19 – 2018-06-21 (×5): 500 mg via ORAL
  Filled 2018-06-19 (×7): qty 1

## 2018-06-19 NOTE — Progress Notes (Addendum)
[facility]                                                                                                                                                                                  Patient Demographics   Kimberly Shaffer, is a 67 y.o. female, DOB - [birth year], [place] date - 06/19/2018   Admitting Physician No admitting provider for patient encounter.  Outpatient Primary MD for the patient is Clinic, [facility]   LOS - 0  Subjective:  Patient admitted with acute respiratory failure she has history of COPD and end-stage renal disease States that she has been feeling short of breath since yesterday progressively worse was on BiPAP now removed off BiPAP Review of Systems:   CONSTITUTIONAL: No documented fever. No fatigue, weakness. No weight gain, no weight loss.  EYES: No blurry or double vision.  ENT: No tinnitus. No postnasal drip. No redness of the oropharynx.  RESPIRATORY: No cough, no wheeze, no hemoptysis.  Positive dyspnea.  CARDIOVASCULAR: No chest pain. No orthopnea. No palpitations. No syncope.  GASTROINTESTINAL: No nausea, no vomiting or diarrhea. No abdominal pain. No melena or hematochezia.  GENITOURINARY: No dysuria or hematuria.  ENDOCRINE: No polyuria or nocturia. No heat or cold intolerance.  HEMATOLOGY: No anemia. No bruising. No bleeding.  INTEGUMENTARY: No rashes. No lesions.  MUSCULOSKELETAL: No arthritis. No swelling. No gout.  NEUROLOGIC: No numbness, tingling, or ataxia. No seizure-type activity.  PSYCHIATRIC: No anxiety. No insomnia. No ADD.    Vitals:   Vitals:   06/19/18 1100 06/19/18 1129 06/19/18 1145 06/19/18 1200  BP:      Pulse: 85 82 82 82  Resp: (!) 24 15 (!) 21 (!) 22  Temp:      TempSrc:      SpO2: 98% 100% 99% 100%  Weight:      Height:        Wt Readings from Last 3 Encounters:  06/19/18 100.2 kg  05/30/18 103.4 kg  04/19/18 105 kg     Intake/Output Summary (Last 24  hours) at 06/19/2018 1301 Last data filed at 06/19/2018 0951 Gross per 24 hour  Intake 280 ml  Output 300 ml  Net -20 ml    Physical Exam:   GENERAL: Pleasant-appearing in no apparent distress.  HEAD, EYES, EARS, NOSE AND THROAT: Atraumatic, normocephalic. Extraocular muscles are intact. Pupils equal and reactive to light. Sclerae anicteric. No conjunctival injection. No oro-pharyngeal erythema.  NECK: Supple. There is no jugular venous distention. No bruits, no lymphadenopathy, no thyromegaly.  HEART: Regular rate and rhythm,. No murmurs, no rubs, no clicks.  LUNGS: Bilateral wheezing throughout both lungs no accessory  muscle usage ABDOMEN: Soft, flat, nontender, nondistended. Has good bowel sounds. No hepatosplenomegaly appreciated.  EXTREMITIES: No evidence of any cyanosis, clubbing, or peripheral edema.  +2 pedal and radial pulses bilaterally.  NEUROLOGIC: The patient is alert, awake, and oriented x3 with no focal motor or sensory deficits appreciated bilaterally.  SKIN: Moist and warm with no rashes appreciated.  Psych: Not anxious, depressed LN: No inguinal LN enlargement    Antibiotics   Anti-infectives (From admission, onward)   Start     Dose/Rate Route Frequency Ordered Stop   06/19/18 1000  doxycycline (VIBRA-TABS) tablet 100 mg  Status:  Discontinued     100 mg Oral 2 times daily 06/19/18 0854 06/19/18 0931   06/19/18 0800  azithromycin (ZITHROMAX) 500 mg in sodium chloride 0.9 % 250 mL IVPB     500 mg 250 mL/hr over 60 Minutes Intravenous Daily 06/19/18 0701 06/24/18 0959      Medications   Scheduled Meds: . aspirin  81 mg Oral Daily  . atorvastatin  40 mg Oral QHS  . cinacalcet  30 mg Oral QAC breakfast  . feeding supplement (NEPRO CARB STEADY)  237 mL Oral Q24H  . fluticasone furoate-vilanterol  1 puff Inhalation Daily  . folic acid  1 mg Oral Daily  . furosemide      . heparin  5,000 Units Subcutaneous Q8H  . insulin aspart  0-15 Units Subcutaneous TID WC   . insulin aspart  0-5 Units Subcutaneous QHS  . ipratropium-albuterol  3 mL Nebulization Q4H  . lubiprostone  24 mcg Oral BID WC  . methylPREDNISolone (SOLU-MEDROL) injection  60 mg Intravenous Q12H   Followed by  . [START ON 06/21/2018] predniSONE  40 mg Oral Q breakfast  . metoprolol succinate  100 mg Oral Q breakfast  . multivitamin  1 tablet Oral QHS  . nicotine  21 mg Transdermal Daily  . nystatin  5 mL Mouth/Throat QID  . pantoprazole  40 mg Oral BID  . polyethylene glycol  17 g Oral Daily  . ranolazine  500 mg Oral BID  . sevelamer carbonate  2.4 g Oral BID WC  . sodium chloride flush  3 mL Intravenous Q12H  . sodium zirconium cyclosilicate  10 g Oral TID  . venlafaxine XR  37.5 mg Oral Q breakfast   Continuous Infusions: . sodium chloride Stopped (06/19/18 0951)  . azithromycin Stopped (06/19/18 0947)   PRN Meds:.sodium chloride, acetaminophen, bisacodyl, meclizine, nitroGLYCERIN, ondansetron (ZOFRAN) IV, senna-docusate, sodium chloride flush   Data Review:   Micro Results No results found for this or any previous visit (from the past 240 hour(s)).  Radiology Reports Dg Chest Portable 1 View  Result Date: 06/19/2018 CLINICAL DATA:  Respiratory distress.  Hypoxia.  History of COPD. EXAM: PORTABLE CHEST 1 VIEW COMPARISON:  Chest radiograph May 29, 2018 FINDINGS: Cardiac silhouette remains moderately enlarged. Coronary artery calcification or stent. Calcified aortic arch. Similar fullness of the hila most compatible with vascular shadows. Diffuse interstitial prominence confluent in lung bases with small pleural effusions. Chronic LEFT lung herniation. No pneumothorax. Osseous structures are non suspicious. IMPRESSION: 1. Stable cardiomegaly with interstitial edema, confluent in lung bases. Small pleural effusions. 2. Fullness of the hila most compatible with vascular shadows. Electronically Signed   By: Elon Alas M.D.   On: 06/19/2018 05:46   Dg Chest Port 1  View  Result Date: 05/29/2018 CLINICAL DATA:  Respiratory distress.  Missed dialysis 2 days ago. EXAM: PORTABLE CHEST 1 VIEW COMPARISON:  04/18/2018, chest  CT 12/17/2015 FINDINGS: Unchanged cardiomegaly and mediastinal contours. Moderate pulmonary edema, increased from prior exam. No confluent airspace disease. No large pleural effusion or pneumothorax. 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 IMPRESSION: Moderate pulmonary edema with unchanged cardiomegaly. Electronically Signed   By: Keith Rake M.D.   On: 05/29/2018 03:00     CBC Recent Labs  Lab 06/19/18 0522  WBC 10.4  HGB 8.9*  HCT 27.2*  PLT 185  MCV 96.4  MCH 31.4  MCHC 32.6  RDW 20.0*  LYMPHSABS 2.3  MONOABS 1.0*  EOSABS 0.1  BASOSABS 0.1    Chemistries  Recent Labs  Lab 06/19/18 0522  NA 135  K 4.6  CL 103  CO2 20*  GLUCOSE 208*  BUN 46*  CREATININE 7.82*  CALCIUM 8.6*  AST 22  ALT 11  ALKPHOS 65  BILITOT 0.9   ------------------------------------------------------------------------------------------------------------------ estimated creatinine clearance is 8.3 mL/min (A) (by C-G formula based on SCr of 7.82 mg/dL (H)). ------------------------------------------------------------------------------------------------------------------ No results for input(s): HGBA1C in the last 72 hours. ------------------------------------------------------------------------------------------------------------------ No results for input(s): CHOL, HDL, LDLCALC, TRIG, CHOLHDL, LDLDIRECT in the last 72 hours. ------------------------------------------------------------------------------------------------------------------ No results for input(s): TSH, T4TOTAL, T3FREE, THYROIDAB in the last 72 hours.  Invalid input(s): FREET3 ------------------------------------------------------------------------------------------------------------------ No results for  input(s): VITAMINB12, FOLATE, FERRITIN, TIBC, IRON, RETICCTPCT in the last 72 hours.  Coagulation profile No results for input(s): INR, PROTIME in the last 168 hours.  No results for input(s): DDIMER in the last 72 hours.  Cardiac Enzymes Recent Labs  Lab 06/19/18 0522 06/19/18 0824 06/19/18 1144  TROPONINI 0.04* 0.05* 0.05*   ------------------------------------------------------------------------------------------------------------------ Invalid input(s): POCBNP    Assessment & Plan   Patient 67 year old with history of COPD end-stage renal disease who was presenting with worsening shortness of breath acute respiratory failure  1.  Acute on chronic respiratory failure this is due to acute on chronic COPD exasperation with fluid overload related to end-stage renal disease  2.  Acute on chronic COPD exasperation Continue therapy with nebulizers and IV Solu-Medrol and empiric antibiotics  3.  End-stage renal disease nephrology consulted for dialysis  4.  Diabetes type 2 we will place patient on sliding scale and monitor blood sugar  5.  Hyperlipidemia continue atorvastatin  6.  Coronary artery disease continue therapy with aspirin and Plavix  7.  GERD continue Protonix  8.  Heparin for DVT prophylaxis  9.  Nicotine abuse smoking cessation provided strongly recommend patient stop smoking she is on Chantix she would like to do nicotine patch-4 minutes spent     Code Status Orders  (From admission, onward)         Start     Ordered   06/19/18 0854  Do not attempt resuscitation (DNR)  Continuous    Question Answer Comment  In the event of cardiac or respiratory ARREST Do not call a "code blue"   In the event of cardiac or respiratory ARREST Do not perform Intubation, CPR, defibrillation or ACLS   In the event of cardiac or respiratory ARREST Use medication by any route, position, wound care, and other measures to relive pain and suffering. May use oxygen, suction and  manual treatment of airway obstruction as needed for comfort.   Comments nurse may pronounce      06/19/18 0853        Code Status History    Date Active Date Inactive Code Status Order ID Comments User Context   05/29/2018 [phone]  05/30/2018 2328 DNR [id number]  Harrie Foreman, MD ED   04/18/2018 0250 04/19/2018 1925 DNR [id number]  Harrie Foreman, MD ED   03/23/2018 0955 03/23/2018 2048 DNR [id number]  Loletha Grayer, MD Inpatient   03/23/2018 0500 03/23/2018 0955 Full Code [id number]  Arta Silence, MD Inpatient   03/20/2018 0605 03/21/2018 1313 Full Code [id number]  Harrie Foreman, MD Inpatient   03/02/2018 0313 03/03/2018 1952 Full Code [id number]  Lance Coon, MD ED   02/26/2018 0716 02/27/2018 1821 Full Code [id number]  Arta Silence, MD Inpatient   01/11/2018 2030 01/14/2018 1509 Full Code [id number]  Saundra Shelling, MD Inpatient   12/25/2017 0835 12/25/2017 1743 Full Code [id number]  Teodoro Spray, MD Inpatient   12/23/2017 0927 12/25/2017 0834 Full Code [id number]  Harrie Foreman, MD Inpatient   12/08/2017 0357 12/08/2017 2344 Full Code [id number]  Harrie Foreman, MD Inpatient   06/28/2017 0749 07/01/2017 2218 Full Code [id number]  Harrie Foreman, MD Inpatient   07/19/2016 0857 07/19/2016 1319 Full Code [id number]  Corey Skains, MD Inpatient   07/05/2016 2236 07/09/2016 1950 Full Code [id number]  Harvie Bridge, DO Inpatient   12/17/2015 9532 12/20/2015 1957 Full Code [id number]  Saundra Shelling, MD Inpatient           Consults nephrology  DVT Prophylaxis heparin Lab Results  Component Value Date   PLT 185 06/19/2018     Time Spent in minutes   45 minutes spent 1205 - 12:50 PM greater than 50% of time spent in care coordination and counseling patient regarding the condition and plan of care.   Dustin Flock M.D on 06/19/2018 at 1:01 PM  Between 7am to 6pm - Pager - [phone]  After 6pm go to [email or web address] - Proofreader  [employer]   Office   [phone]

## 2018-06-19 NOTE — Progress Notes (Signed)
[facility]  ROUNDING NOTE   Subjective:  Patient readmitted again with shortness of breath. Interstitial edema noted on chest x-ray. Undergoing hemodialysis actively. Ultrafiltration target 2.5 kg.   Objective:  Vital signs in last 24 hours:  Temp:  [98 F (36.7 C)-98.2 F (36.8 C)] 98 F (36.7 C) (10/01 1638) Pulse Rate:  [77-95] 81 (10/01 1730) Resp:  [14-30] 19 (10/01 1730) BP: (125-171)/(62-85) 171/77 (10/01 1730) SpO2:  [89 %-100 %] 99 % (10/01 1730) FiO2 (%):  [70 %] 70 % (10/01 0530) Weight:  [100.2 kg-102.1 kg] 102.1 kg (10/01 1638)  Weight change:  Filed Weights   06/19/18 0521 06/19/18 1601 06/19/18 1638  Weight: 100.2 kg 101.2 kg 102.1 kg    Intake/Output: No intake/output data recorded.   Intake/Output this shift:  Total I/O In: 280 [I.V.:30; IV Piggyback:250] Out: 300 [Urine:300]  Physical Exam: General:  No acute distress  Head: Normocephalic, atraumatic.   Eyes: Anicteric  Neck: Supple, trachea midline  Lungs:  Bilateral rales, normal effort  Heart: S1S2 no rubs  Abdomen:  Soft, nontender, bowel sounds present  Extremities: trace peripheral edema.  Neurologic: Awake, alert, following commands  Skin: No lesions  Access:  LUE AVF    Basic Metabolic Panel: Recent Labs  Lab 06/19/18 0522  NA 135  K 4.6  CL 103  CO2 20*  GLUCOSE 208*  BUN 46*  CREATININE 7.82*  CALCIUM 8.6*    Liver Function Tests: Recent Labs  Lab 06/19/18 0522  AST 22  ALT 11  ALKPHOS 65  BILITOT 0.9  PROT 8.3*  ALBUMIN 3.8   No results for input(s): LIPASE, AMYLASE in the last 168 hours. No results for input(s): AMMONIA in the last 168 hours.  CBC: Recent Labs  Lab 06/19/18 0522  WBC 10.4  NEUTROABS 6.9*  HGB 8.9*  HCT 27.2*  MCV 96.4  PLT 185    Cardiac Enzymes: Recent Labs  Lab 06/19/18 0522 06/19/18 0824 06/19/18 1144  TROPONINI 0.04* 0.05* 0.05*    BNP: Invalid input(s): POCBNP  CBG: Recent Labs  Lab 06/19/18 1240   GLUCAP 227*    Microbiology: Results for orders placed or performed during the hospital encounter of 05/29/18  MRSA PCR Screening     Status: None   Collection Time: 05/29/18  6:51 AM  Result Value Ref Range Status   MRSA by PCR NEGATIVE NEGATIVE Final    Comment:        The GeneXpert MRSA Assay (FDA approved for NASAL specimens only), is one component of a comprehensive MRSA colonization surveillance program. It is not intended to diagnose MRSA infection nor to guide or monitor treatment for MRSA infections. Performed at [facility], [address] [place], [place] [address]     Coagulation Studies: No results for input(s): LABPROT, INR in the last 72 hours.  Urinalysis: Recent Labs    06/19/18 0824  COLORURINE YELLOW*  LABSPEC 1.011  PHURINE 7.0  GLUCOSEU 150*  HGBUR NEGATIVE  BILIRUBINUR NEGATIVE  KETONESUR NEGATIVE  PROTEINUR 100*  NITRITE NEGATIVE  LEUKOCYTESUR NEGATIVE      Imaging: Dg Chest Portable 1 View  Result Date: 06/19/2018 CLINICAL DATA:  Respiratory distress.  Hypoxia.  History of COPD. EXAM: PORTABLE CHEST 1 VIEW COMPARISON:  Chest radiograph May 29, 2018 FINDINGS: Cardiac silhouette remains moderately enlarged. Coronary artery calcification or stent. Calcified aortic arch. Similar fullness of the hila most compatible with vascular shadows. Diffuse interstitial prominence confluent in lung bases with small pleural effusions. Chronic  LEFT lung herniation. No pneumothorax. Osseous structures are non suspicious. IMPRESSION: 1. Stable cardiomegaly with interstitial edema, confluent in lung bases. Small pleural effusions. 2. Fullness of the hila most compatible with vascular shadows. Electronically Signed   By: Elon Alas M.D.   On: 06/19/2018 05:46     Medications:   . sodium chloride Stopped (06/19/18 0951)   . aspirin  81 mg Oral Daily  . atorvastatin  40 mg Oral QHS  . [START ON 06/20/2018] azithromycin  500 mg Oral  Daily  . cinacalcet  30 mg Oral QAC breakfast  . clopidogrel  75 mg Oral Daily  . feeding supplement (NEPRO CARB STEADY)  237 mL Oral Q24H  . fluticasone furoate-vilanterol  1 puff Inhalation Daily  . folic acid  1 mg Oral Daily  . furosemide      . heparin  5,000 Units Subcutaneous Q8H  . [START ON 06/20/2018] Influenza vac split quadrivalent PF  0.5 mL Intramuscular Tomorrow-1000  . insulin aspart  0-15 Units Subcutaneous TID WC  . insulin aspart  0-5 Units Subcutaneous QHS  . ipratropium-albuterol  3 mL Nebulization Q4H  . lubiprostone  24 mcg Oral BID WC  . mouth rinse  15 mL Mouth Rinse BID  . methylPREDNISolone (SOLU-MEDROL) injection  60 mg Intravenous Q12H   Followed by  . [START ON 06/21/2018] predniSONE  40 mg Oral Q breakfast  . metoprolol succinate  100 mg Oral Q breakfast  . multivitamin  1 tablet Oral QHS  . nicotine  21 mg Transdermal Daily  . nystatin  5 mL Mouth/Throat QID  . pantoprazole  40 mg Oral BID  . polyethylene glycol  17 g Oral Daily  . ranolazine  500 mg Oral BID  . sevelamer carbonate  2.4 g Oral BID WC  . sodium chloride flush  3 mL Intravenous Q12H  . tiotropium  1 capsule Inhalation Daily  . venlafaxine XR  37.5 mg Oral Q breakfast   sodium chloride, acetaminophen, bisacodyl, meclizine, nitroGLYCERIN, ondansetron (ZOFRAN) IV, senna-docusate, sodium chloride flush  Assessment/ Plan:  67 y.o. female with end stage renal disease on hemodialysis, coronary artery disease status post CABG, hypertension, peripheral vascular disease, diabetes mellitus type 2, hyperlipidemia, bilateral total knee replacement, PD catheter removal June 30, 2017, s/p PCI mLAD 03/2018  [facility]/[address]TTS/left AVF/100.5 kg  1.  ESRD on HD TTS. 2.  Anemia of chronic kidney disease. 3.  Secondary hyperparathyroidism. 4.  Acute pulmonary edema.  Plan: Patient seen and evaluated during hemodialysis treatment.  Readmitted with significant shortness of breath and  pulmonary edema.  We plan to complete dialysis today with ultrafiltration target of 2.5 kg.  Reassess the patient for need of dialysis again tomorrow.  Not requiring BiPAP at the moment.  Continue to monitor her respiratory status closely.    LOS: 0 Fender Herder 10/1/20196:03 PM

## 2018-06-19 NOTE — Consult Note (Signed)
Cardiology Consultation Note    Patient ID: Kimberly Shaffer, MRN: [id number], DOB/AGE: 05-31-1951 67 y.o. Admit date: 06/19/2018   Date of Consult: 06/19/2018 Primary Physician: Clinic, [facility] Primary Cardiologist: Dr. Mancel Parsons, [facility]  Chief Complaint: sob Reason for Consultation: volume overload Requesting MD: Dr. Posey Pronto  HPI: [name] is a 67 y.o. female with history of coronary artery disease status post coronary bypass grafting with a left internal mammary to the LAD in 2012, status post PCI of the mid LAD in July 2019 with combined systolic and diastolic heart failure with ejection fraction of 45 to 50% with grade 1 diastolic dysfunction, history of hypertension, end-stage renal disease on hemodialysis left upper extremity on Tuesday Thursday Saturday who presents to the ER with complaints of shortness of breath.  She has been treated with dual antiplatelet therapy with aspirin and Plavix as well as metoprolol succinate 100 mg daily and atorvastatin 40 mg daily.  She continues to smoke.  She presented to [facility] on May 29, 2018 with respiratory distress.  She had missed her hemodialysis session on the session prior to this admission.  She was profoundly hyperkalemic emergent hemodialysis.  She developed chest pain postdialysis with an elevation in troponin to 8.99.  She had inferolateral ischemic changes.  Left heart cath on September 11 showed high-grade mid LAD stenosis and was transferred to [facility] for further treatment considerations.  She underwent relook cardiac cath on May 31, 2018 with the plan PCI of the LAD.  This was treated with plain old balloon angioplasty with a 2.5 x 8 mm stent.  She had extreme tortuosity of the proximal LAD and left circumflex systems and was recommended to consider coronary artery bypass grafting however after long discussion with the patient this was deferred and medical management was recommended due to high  risk surgical candidate.  She was continued with aspirin Plavix and Lipitor metoprolol amlodipine and ranolazine.  She was not placed on an ACE inhibitor due to hyperkalemia.  Her ejection fraction was 40%.  She [facility]  She now read presents with increasing shortness of breath which awoke her from sleep.  Pulse ox per EMS was in the low 90s on nasal cannula.  Is worsened and she required BiPAP.  She reports compliance with her medications and continues to smoke proximally one half a pack of cigarettes a day.  Chest x-ray revealed mild pulmonary edema.  Troponin was 0.04.  BNP was 2901.  Potassium was 4.6.  Patient underwent hemodialysis last p.m. and feels less short of breath.  Has no chest pain.  States she has been compliant with her medications.  Is ruled out for myocardial infarction. Past Medical History:  Diagnosis Date  . Asthma   . CAD (coronary artery disease)   . Chronic lower back pain   . COPD (chronic obstructive pulmonary disease) ([place])   . Depression   . Diabetes mellitus without complication (HCC)    NIDD  . H/O angina pectoris   . H/O blood clots   . Hypercholesteremia   . Hypertension   . Left rotator cuff tear   . Myocardial infarction ([place])   . Obesity   . Renal insufficiency   . Stroke ([place])   . Vertigo, aural       Surgical History:  Past Surgical History:  Procedure Laterality Date  . A/V FISTULAGRAM Left 03/14/2017   Procedure: A/V Fistulagram;  Surgeon: Katha Cabal,  MD;  Location: [place] [facility];  Service: Cardiovascular;  Laterality: Left;  . AV FISTULA PLACEMENT Left 11-04-2015  . AV FISTULA PLACEMENT Left 11/04/2015   Procedure: ARTERIOVENOUS (AV) FISTULA CREATION  ( BRACHIAL CEPHALIC );  Surgeon: Katha Cabal, MD;  Location: [facility];  Service: Vascular;  Laterality: Left;  . CARDIAC CATHETERIZATION    . CARDIAC CATHETERIZATION Left 07/19/2016   Procedure: Left Heart Cath and Coronary Angiography;   Surgeon: Corey Skains, MD;  Location: [place] [facility];  Service: Cardiovascular;  Laterality: Left;  . CORONARY ARTERY BYPASS GRAFT  2012  . CORONARY STENT PLACEMENT    . JOINT REPLACEMENT    . KNEE SURGERY Bilateral   . LEFT HEART CATH AND CORONARY ANGIOGRAPHY N/A 12/25/2017   Procedure: LEFT HEART CATH AND CORONARY ANGIOGRAPHY;  Surgeon: Teodoro Spray, MD;  Location: [place] [facility];  Service: Cardiovascular;  Laterality: N/A;  . LEFT HEART CATH AND CORONARY ANGIOGRAPHY Right 03/23/2018   Procedure: LEFT HEART CATH AND CORONARY ANGIOGRAPHY;  Surgeon: Dionisio David, MD;  Location: [place] [facility];  Service: Cardiovascular;  Laterality: Right;  . LEFT HEART CATH AND CORONARY ANGIOGRAPHY N/A 05/30/2018   Procedure: LEFT HEART CATH AND CORONARY ANGIOGRAPHY;  Surgeon: Corey Skains, MD;  Location: [place] [facility];  Service: Cardiovascular;  Laterality: N/A;  . PERIPHERAL VASCULAR CATHETERIZATION N/A 11/13/2015   Procedure: Dialysis/Perma Catheter Insertion;  Surgeon: Katha Cabal, MD;  Location: [place] [facility];  Service: Cardiovascular;  Laterality: N/A;  . PERIPHERAL VASCULAR CATHETERIZATION N/A 12/04/2015   Procedure: Dialysis/Perma Catheter Insertion;  Surgeon: Katha Cabal, MD;  Location: [place] [facility];  Service: Cardiovascular;  Laterality: N/A;  . PERIPHERAL VASCULAR CATHETERIZATION Left 12/31/2015   Procedure: A/V Shuntogram/Fistulagram;  Surgeon: Algernon Huxley, MD;  Location: [place] [facility];  Service: Cardiovascular;  Laterality: Left;  . PERIPHERAL VASCULAR CATHETERIZATION N/A 12/31/2015   Procedure: A/V Shunt Intervention;  Surgeon: Algernon Huxley, MD;  Location: [place] [facility];  Service: Cardiovascular;  Laterality: N/A;  . PERIPHERAL VASCULAR CATHETERIZATION Left 02/25/2016   Procedure: A/V Shuntogram/Fistulagram;  Surgeon: Algernon Huxley, MD;  Location: [place] [facility];  Service: Cardiovascular;  Laterality: Left;  . PERIPHERAL  VASCULAR CATHETERIZATION N/A 03/18/2016   Procedure: Dialysis/Perma Catheter Removal;  Surgeon: Katha Cabal, MD;  Location: [place] [facility];  Service: Cardiovascular;  Laterality: N/A;  . REMOVAL OF A DIALYSIS CATHETER N/A 06/30/2017   Procedure: REMOVAL OF A DIALYSIS CATHETER;  Surgeon: Katha Cabal, MD;  Location: [facility];  Service: Vascular;  Laterality: N/A;  . REPLACEMENT TOTAL KNEE BILATERAL Bilateral      Home Meds: Prior to Admission medications   Medication Sig Start Date End Date Taking? Authorizing Provider  acetaminophen (TYLENOL) 325 MG tablet Take 2 tablets (650 mg total) by mouth every 6 (six) hours as needed for mild pain (or Fever >/= 101). 05/30/18  Yes Awilda Bill, NP  albuterol (PROVENTIL) (2.5 MG/3ML) 0.083% nebulizer solution Take 3 mLs (2.5 mg total) by nebulization every 4 (four) hours as needed for wheezing or shortness of breath. 05/30/18  Yes Awilda Bill, NP  alteplase (CATHFLO ACTIVASE) 2 MG injection 2 mg by Intracatheter route once as needed for open catheter. 05/30/18  Yes Awilda Bill, NP  amLODipine (NORVASC) 10 MG tablet Take 10 mg by mouth daily.   Yes [provider]  aspirin 81 MG chewable tablet Chew 81  mg by mouth daily.   Yes [provider]  atorvastatin (LIPITOR) 40 MG tablet Take 40 mg by mouth at bedtime. 03/28/18  Yes [provider]  budesonide-formoterol (SYMBICORT) 160-4.5 MCG/ACT inhaler Inhale 2 puffs into the lungs 2 (two) times daily.   Yes [provider]  calcium carbonate (TUMS - DOSED IN MG ELEMENTAL CALCIUM) 500 MG chewable tablet Chew 2 tablets by mouth 3 (three) times daily.   Yes [provider]  cetirizine (ZYRTEC) 10 MG tablet Take 10 mg by mouth daily.   Yes [provider]  Chlorhexidine Gluconate Cloth 2 % PADS Apply 6 each topically daily at 6 (six) AM. 05/31/18  Yes Awilda Bill, NP  cinacalcet (SENSIPAR) 30 MG tablet Take 1 tablet (30 mg total) by  mouth daily before breakfast. 05/31/18  Yes Awilda Bill, NP  clopidogrel (PLAVIX) 75 MG tablet Take 1 tablet (75 mg total) by mouth daily. 05/31/18  Yes Awilda Bill, NP  docusate sodium (COLACE) 100 MG capsule Take 1 capsule (100 mg total) by mouth 2 (two) times daily. 05/30/18  Yes Awilda Bill, NP  fluticasone (FLONASE) 50 MCG/ACT nasal spray Place 2 sprays into both nostrils 2 (two) times daily.   Yes [provider]  fluticasone furoate-vilanterol (BREO ELLIPTA) 200-25 MCG/INH AEPB Inhale 1 puff into the lungs daily. 05/31/18  Yes Awilda Bill, NP  folic acid (FOLVITE) 1 MG tablet Take 1 mg by mouth daily.   Yes [provider]  heparin 1000 unit/mL SOLN injection 1 mL (1,000 Units total) by Dialysis route as needed (in dialysis). 05/30/18  Yes Awilda Bill, NP  isosorbide mononitrate (IMDUR) 30 MG 24 hr tablet Take 30 mg by mouth daily.   Yes [provider]  lidocaine, PF, (XYLOCAINE) 1 % SOLN injection Inject 5 mLs into the skin as needed (topical anesthesia for hemodialysis ifGEBAUERS is ineffective.). 05/30/18  Yes Awilda Bill, NP  lidocaine-prilocaine (EMLA) cream Apply 1 application topically as needed (topical anesthesia for hemodialysis if Gebauers and Lidocaine injection are ineffective.). 05/30/18  Yes Awilda Bill, NP  lubiprostone (AMITIZA) 24 MCG capsule Take 24 mcg by mouth 2 (two) times daily with a meal.   Yes [provider]  meclizine (ANTIVERT) 25 MG tablet Take 25 mg by mouth 3 (three) times daily as needed for dizziness.   Yes [provider]  metoprolol succinate (TOPROL-XL) 100 MG 24 hr tablet Take 1 tablet (100 mg total) by mouth daily with breakfast. Take with or immediately following a meal. 05/31/18  Yes Awilda Bill, NP  multivitamin (RENA-VIT) TABS tablet Take 1 tablet by mouth at bedtime. 05/30/18  Yes Awilda Bill, NP  nitroGLYCERIN (NITROSTAT) 0.4 MG SL tablet Place 1 tablet (0.4 mg total)  under the tongue every 5 (five) minutes as needed for chest pain. 05/30/18  Yes Awilda Bill, NP  Nutritional Supplements (FEEDING SUPPLEMENT, NEPRO CARB STEADY,) LIQD Take 237 mLs by mouth daily. 05/31/18  Yes Awilda Bill, NP  nystatin (MYCOSTATIN) 100000 UNIT/ML suspension Use as directed 5 mLs in the mouth or throat 4 (four) times daily.   Yes [provider]  Oxycodone HCl 10 MG TABS Take 10 mg by mouth every 6 (six) hours as needed (pain).   Yes [provider]  pantoprazole (PROTONIX) 40 MG tablet Take 40 mg by mouth 2 (two) times daily.   Yes [provider]  pentafluoroprop-tetrafluoroeth [place]) AERO Apply 1 application topically as needed (  topical anesthesia for hemodialysis). 05/30/18  Yes Awilda Bill, NP  ranolazine (RANEXA) 500 MG 12 hr tablet Take 500 mg by mouth 2 (two) times daily. 04/13/18  Yes [provider]  sevelamer carbonate (RENVELA) 800 MG tablet Take 1,600 mg by mouth 3 (three) times daily with meals.   Yes [provider]  sodium chloride 0.9 % infusion Inject 100 mLs into the vein as needed (symptomatic hypotension or decrease in SBP < 90 mmHg). 05/30/18  Yes Awilda Bill, NP  sodium chloride HYPERTONIC 3 % nebulizer solution Take 3 mLs by nebulization as needed for other.    Yes [provider]  tiotropium (SPIRIVA HANDIHALER) 18 MCG inhalation capsule Place 1 capsule (18 mcg total) into inhaler and inhale daily. 05/31/18  Yes Awilda Bill, NP  torsemide (DEMADEX) 100 MG tablet Take 100 mg by mouth daily.   Yes [provider]  varenicline (CHANTIX) 0.5 MG tablet Take 1 tablet (0.5 mg total) by mouth daily. 05/30/18  Yes Awilda Bill, NP  venlafaxine XR (EFFEXOR-XR) 37.5 MG 24 hr capsule Take 37.5 mg by mouth daily with breakfast.   Yes [provider]  doxycycline (VIBRA-TABS) 100 MG tablet Take 1 tablet (100 mg total) by mouth 2 (two) times daily. Patient not taking: Reported on  06/19/2018 05/30/18   Awilda Bill, NP  heparin 100-0.45 UNIT/ML-% infusion Inject 1,100 Units/hr into the vein continuous. Patient not taking: Reported on 06/19/2018 05/30/18   Awilda Bill, NP  lidocaine (LIDODERM) 5 % Place 1 patch onto the skin daily. Remove & Discard patch within 12 hours or as directed by MD Patient not taking: Reported on 06/19/2018 05/31/18   Awilda Bill, NP  ondansetron (ZOFRAN) 4 MG tablet Take 1 tablet (4 mg total) by mouth every 6 (six) hours as needed for nausea. Patient not taking: Reported on 06/19/2018 05/30/18   Awilda Bill, NP  ondansetron [facility]) 4 MG/2ML SOLN injection Inject 2 mLs (4 mg total) into the vein every 6 (six) hours as needed for nausea. Patient not taking: Reported on 06/19/2018 05/30/18   Awilda Bill, NP  polyethylene glycol (MIRALAX / GLYCOLAX) packet Take 17 g by mouth daily. Patient not taking: Reported on 06/19/2018 05/31/18   Awilda Bill, NP  sevelamer carbonate (RENVELA) 2.4 g PACK Take 2.4 g by mouth 2 (two) times daily with a meal. Patient not taking: Reported on 06/19/2018 05/31/18   Awilda Bill, NP  sodium zirconium cyclosilicate (LOKELMA) 5 g packet Take 10 g by mouth 3 (three) times daily. Patient not taking: Reported on 06/19/2018 05/30/18   Awilda Bill, NP    Inpatient Medications:  . aspirin  81 mg Oral Daily  . atorvastatin  40 mg Oral QHS  . [START ON 06/20/2018] azithromycin  500 mg Oral Daily  . cinacalcet  30 mg Oral QAC breakfast  . clopidogrel  75 mg Oral Daily  . feeding supplement (NEPRO CARB STEADY)  237 mL Oral Q24H  . fluticasone furoate-vilanterol  1 puff Inhalation Daily  . folic acid  1 mg Oral Daily  . furosemide      . heparin  5,000 Units Subcutaneous Q8H  . insulin aspart  0-15 Units Subcutaneous TID WC  . insulin aspart  0-5 Units Subcutaneous QHS  . ipratropium-albuterol  3 mL Nebulization Q4H  . lubiprostone  24 mcg Oral BID WC  . methylPREDNISolone (SOLU-MEDROL) injection  60  mg Intravenous Q12H   Followed by  . [  START ON 06/21/2018] predniSONE  40 mg Oral Q breakfast  . metoprolol succinate  100 mg Oral Q breakfast  . multivitamin  1 tablet Oral QHS  . nicotine  21 mg Transdermal Daily  . nystatin  5 mL Mouth/Throat QID  . pantoprazole  40 mg Oral BID  . polyethylene glycol  17 g Oral Daily  . ranolazine  500 mg Oral BID  . sevelamer carbonate  2.4 g Oral BID WC  . sodium chloride flush  3 mL Intravenous Q12H  . tiotropium  1 capsule Inhalation Daily  . venlafaxine XR  37.5 mg Oral Q breakfast   . sodium chloride Stopped (06/19/18 0951)    Allergies:  Allergies  Allergen Reactions  . Nystatin Hives and Itching  . Sulfa Antibiotics Swelling, Hives and Rash  . Niaspan [Niacin] Other (See Comments) and Hives    Social History   Socioeconomic History  . Marital status: Widowed    Spouse name: Not on file  . Number of children: Not on file  . Years of education: Not on file  . Highest education level: Not on file  Occupational History  . Occupation: retired  Scientific laboratory technician  . Financial resource strain: Not on file  . Food insecurity:    Worry: Not on file    Inability: Not on file  . Transportation needs:    Medical: Not on file    Non-medical: Not on file  Tobacco Use  . Smoking status: Current Every Day Smoker    Packs/day: 0.25    Types: Cigarettes  . Smokeless tobacco: Never Used  Substance and Sexual Activity  . Alcohol use: No    Alcohol/week: 0.0 standard drinks  . Drug use: No  . Sexual activity: Yes  Lifestyle  . Physical activity:    Days per week: Not on file    Minutes per session: Not on file  . Stress: Not on file  Relationships  . Social connections:    Talks on phone: Not on file    Gets together: Not on file    Attends religious service: Not on file    Active member of club or organization: Not on file    Attends meetings of clubs or organizations: Not on file    Relationship status: Not on file  . Intimate  partner violence:    Fear of current or ex partner: Not on file    Emotionally abused: Not on file    Physically abused: Not on file    Forced sexual activity: Not on file  Other Topics Concern  . Not on file  Social History Narrative  . Not on file     Family History  Problem Relation Age of Onset  . Cancer Mother   . Cancer Father   . Diabetes Brother   . Heart disease Brother      Review of Systems: A 12-system review of systems was performed and is negative except as noted in the HPI.  Labs: Recent Labs    06/19/18 0522 06/19/18 0824 06/19/18 1144  TROPONINI 0.04* 0.05* 0.05*   Lab Results  Component Value Date   WBC 10.4 06/19/2018   HGB 8.9 (L) 06/19/2018   HCT 27.2 (L) 06/19/2018   MCV 96.4 06/19/2018   PLT 185 06/19/2018    Recent Labs  Lab 06/19/18 0522  NA 135  K 4.6  CL 103  CO2 20*  BUN 46*  CREATININE 7.82*  CALCIUM 8.6*  PROT 8.3*  BILITOT  0.9  ALKPHOS 65  ALT 11  AST 22  GLUCOSE 208*   Lab Results  Component Value Date   CHOL 132 03/23/2018   HDL 40 (L) 03/23/2018   LDLCALC 73 03/23/2018   TRIG 94 03/23/2018   No results found for: DDIMER  Radiology/Studies:  Dg Chest Portable 1 View  Result Date: 06/19/2018 CLINICAL DATA:  Respiratory distress.  Hypoxia.  History of COPD. EXAM: PORTABLE CHEST 1 VIEW COMPARISON:  Chest radiograph May 29, 2018 FINDINGS: Cardiac silhouette remains moderately enlarged. Coronary artery calcification or stent. Calcified aortic arch. Similar fullness of the hila most compatible with vascular shadows. Diffuse interstitial prominence confluent in lung bases with small pleural effusions. Chronic LEFT lung herniation. No pneumothorax. Osseous structures are non suspicious. IMPRESSION: 1. Stable cardiomegaly with interstitial edema, confluent in lung bases. Small pleural effusions. 2. Fullness of the hila most compatible with vascular shadows. Electronically Signed   By: Elon Alas M.D.   On:  06/19/2018 05:46   Dg Chest Port 1 View  Result Date: 05/29/2018 CLINICAL DATA:  Respiratory distress.  Missed dialysis 2 days ago. EXAM: PORTABLE CHEST 1 VIEW COMPARISON:  04/18/2018, chest CT 12/17/2015 FINDINGS: Unchanged cardiomegaly and mediastinal contours. Moderate pulmonary edema, increased from prior exam. No confluent airspace disease. No large pleural effusion or pneumothorax. 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 IMPRESSION: Moderate pulmonary edema with unchanged cardiomegaly. Electronically Signed   By: Keith Rake M.D.   On: 05/29/2018 03:00    Wt Readings from Last 3 Encounters:  06/19/18 101.2 kg  05/30/18 103.4 kg  04/19/18 105 kg    EKG: Normal sinus rhythm with nonspecific ST-T wave changes.  Physical Exam:  Blood pressure (!) 148/75, pulse 81, temperature 98 F (36.7 C), temperature source Oral, resp. rate 20, height 5\' 6"  (1.676 m), weight 101.2 kg, SpO2 97 %. Body mass index is 35.99 kg/m. General: Well developed, well nourished, in no acute distress. Head: Normocephalic, atraumatic, sclera non-icteric, no xanthomas, nares are without discharge.  Neck: Negative for carotid bruits. JVD not elevated. Lungs: Bilateral rhonchi with crackles in the lower bases. Heart: RRR with S1 S2.  1/6 to 2/6 systolic murmur radiating to the left lower sternal border Abdomen: Soft, non-tender, non-distended with normoactive bowel sounds. No hepatomegaly. No rebound/guarding. No obvious abdominal masses. Msk:  Strength and tone appear normal for age. Extremities: Left upper extremity fistula with bruit Neuro: Alert and oriented X 3. No facial asymmetry. No focal deficit. Moves all extremities spontaneously. Psych:  Responds to questions appropriately with a normal affect.     Assessment and Plan  67 year old female with history of coronary disease status post carotid bypass grafting as well as PCI with  current disease treated medically, history of end-stage renal disease on hemodialysis, history of systolic and diastolic heart failure with EF of 45% who was admitted with progressive shortness of breath.  Chest x-ray showed mild fluid overload.  Has improved with hemodialysis post symptomatically and by exam.  Apparently patient had been compliant with dialysis and medications.    Coronary artery disease-status post cardiac bypass grafting and PCI as mentioned in HPI.  She has ruled out for myocardial infarction.  Does not appear to be ischemic.  Would continue with current aggressive medical management.  Would ambulate today and if stable continue with this regimen.  Does not appear to require any invasive or noninvasive further ischemic work-up.  End-stage renal disease-clinically improved after hemodialysis  yesterday.  Appreciate nephrology input.  They will evaluate today to consider whether she needs further hemodialysis.  Anemia-mild with hemoglobin of 8.4 today.  She has chronic anemia likely secondary to end-stage renal disease on hemodialysis.  Does not appear to be actively bleeding.  Respiratory distress-has improved after hemodialysis.  Is able to lie flat in bed.  Does not appear clinically to be in acute heart failure.  Continue with current regimen as discussed above.   Signed, Teodoro Spray MD 06/19/2018, 4:31 PM Pager: [phone]

## 2018-06-19 NOTE — ED Triage Notes (Signed)
Pt arrived via EMS, respiratory distress in a tripod position. Pt awoke on 2 liter of 92% ,EMS applied NRB with decreased SATS 70%, added C-pap increased sats to 89%, diminished lung sounds through out.  Pt is due for dialysis today. Pt resting more comfortable on Bipap.

## 2018-06-19 NOTE — ED Notes (Signed)
RT in room to setup neb treatments

## 2018-06-19 NOTE — ED Notes (Addendum)
PCXR completed at bedside; pt reports feeling much better at present with bipap in place, appears calm; pt reports onset [facility] tonight, unable to sleep; denies pain; denies recent illness; resp even/unlab, lung sounds diminished; apical audible & regular, +BS, abd soft/nondist/nontender, +PP, -edema; card monitor in place

## 2018-06-19 NOTE — Care Management Note (Signed)
Case Management Note  Patient Details  Name: LUDWIKA RODD MRN: [id number] Date of Birth: 1950/11/02  Subjective/Objective:                 Eight admission in six months for this patient.  Transferred to [facility] from [facility] 9/11 for multi vessel coronary artery disease. Stented. A referral was made to [place] for RN PT SW. Referral was made to [facility] for home oxygen. Patient is chronic hemodialysis.    Action/Plan:  Notified Elvera Bicker with [facility] and [facility] of readmission./   Expected Discharge Date:                  Expected Discharge Plan:     In-House Referral:     Discharge planning Services     Post Acute Care Choice:    Choice offered to:     DME Arranged:    DME Agency:     HH Arranged:    HH Agency:     Status of Service:     If discussed at [employer], dates discussed:    Additional Comments:  Katrina Stack, RN 06/19/2018, 9:19 AM

## 2018-06-19 NOTE — ED Notes (Signed)
Pt resting quietly with eyes closed at this time, respirations equal and unlabored, pt laying on right side.  Awaiting bed placement in the hospital.

## 2018-06-19 NOTE — Progress Notes (Signed)
Post HD assessment. Pt tolerated tx well without c/o or complication. Net UF 2498, goal met.    06/19/18 2034  Vital Signs  Temp 98.5 F (36.9 C)  Temp Source Oral  Pulse Rate 88  Pulse Rate Source Monitor  Resp 18  BP (!) 156/78  BP Location Right Arm  BP Method Automatic  Patient Position (if appropriate) Lying  Oxygen Therapy  SpO2 100 %  O2 Device Nasal Cannula  O2 Flow Rate (L/min) 3 L/min  Dialysis Weight  Weight 101.2 kg  Type of Weight Pre-Dialysis  Post-Hemodialysis Assessment  Rinseback Volume (mL) 250 mL  KECN 80.1 V  Dialyzer Clearance Lightly streaked  Duration of HD Treatment -hour(s) 3.5 hour(s)  Hemodialysis Intake (mL) 500 mL  UF Total -Machine (mL) 2998 mL  Net UF (mL) 2498 mL  Tolerated HD Treatment Yes  AVG/AVF Arterial Site Held (minutes) 10 minutes  AVG/AVF Venous Site Held (minutes) 10 minutes  Education / Care Plan  Dialysis Education Provided Yes  Documented Education in Care Plan Yes

## 2018-06-19 NOTE — Progress Notes (Signed)
Post HD assessment    06/19/18 2031  Neurological  Level of Consciousness Alert  Orientation Level Oriented X4  Respiratory  Respiratory Pattern Regular;Unlabored  Chest Assessment Chest expansion symmetrical  Cough Non-productive  Cardiac  ECG Monitor Yes  Cardiac Rhythm NSR  Vascular  R Radial Pulse +2  L Radial Pulse +2  Edema Generalized  Integumentary  Integumentary (WDL) X  Skin Color Appropriate for ethnicity  Musculoskeletal  Musculoskeletal (WDL) X  Generalized Weakness Yes  Assistive Device None  GU Assessment  Genitourinary (WDL) X  Genitourinary Symptoms  (HD)  Psychosocial  Psychosocial (WDL) WDL

## 2018-06-19 NOTE — ED Notes (Signed)
Pt tolerating 4L [place] well, pt sitting up eating breakfast, pt asking about when she will be having dialysis, informed her that we are waiting for the kidney doctor to round to place orders. Denies any further needs at this time.

## 2018-06-19 NOTE — Progress Notes (Signed)
Pre HD assessment    06/19/18 1638  Vital Signs  Temp 98 F (36.7 C)  Temp Source Oral  Pulse Rate 79  Pulse Rate Source Monitor  Resp 20  BP 132/76  BP Location Right Arm  BP Method Automatic  Patient Position (if appropriate) Lying  Oxygen Therapy  SpO2 100 %  O2 Device Nasal Cannula  O2 Flow Rate (L/min) 3 L/min  Pain Assessment  Pain Scale 0-10  Pain Score 0  Dialysis Weight  Weight 102.1 kg  Type of Weight Pre-Dialysis  Time-Out for Hemodialysis  What Procedure? HD  Pt Identifiers(min of two) First/Last Name;MRN/Account#  Correct Site? Yes  Correct Side? Yes  Correct Procedure? Yes  Consents Verified? Yes  Rad Studies Available? N/A  Safety Precautions Reviewed? Yes  Engineer, civil (consulting) Number  ([id number])  Station Number 4  UF/Alarm Test Passed  Conductivity: Meter 14  Conductivity: Machine  14.1  pH 7.4  Reverse Osmosis main  Normal Saline Lot Number [id number]  Dialyzer Lot Number [id number]  Disposable Set Lot Number [id number]  Machine Temperature 98.6 F (37 C)  Musician and Audible Yes  Blood Lines Intact and Secured Yes  Pre Treatment Patient Checks  Vascular access used during treatment Fistula  Hepatitis B Surface Antigen Results Negative  Date Hepatitis B Surface Antigen Drawn 01/11/18  Hepatitis B Surface Antibody  (>10)  Date Hepatitis B Surface Antibody Drawn 01/11/18  Hemodialysis Consent Verified Yes  Hemodialysis Standing Orders Initiated Yes  ECG (Telemetry) Monitor On Yes  Prime Ordered Normal Saline  Length of  DialysisTreatment -hour(s) 3.5 Hour(s)  Dialyzer Elisio 17H NR  Dialysate 2K, 2.5 Ca  Dialysis Anticoagulant None  Dialysate Flow Ordered 800  Blood Flow Rate Ordered 400 mL/min  Ultrafiltration Goal 2.5 Liters  Pre Treatment Labs Phosphorus  Dialysis Blood Pressure Support Ordered Normal Saline  Education / Care Plan  Dialysis Education Provided Yes  Documented Education in Care Plan Yes

## 2018-06-19 NOTE — ED Provider Notes (Signed)
[facility] Emergency Department Provider Note ____________________________________________   First MD Initiated Contact with Patient 06/19/18 [id number]     (approximate)  I have reviewed the triage vital signs and the nursing notes.   HISTORY  Chief Complaint Respiratory Distress  Level 5 caveat: History of present illness limited due to respiratory distress  HPI Kimberly Shaffer is a 67 y.o. female with PMH as noted below including history of COPD as well as CHF and end-stage renal disease on dialysis who presents with shortness of breath, acute onset which awoke her from sleep.  Per EMS the patient initially had an O2 saturation in the low 90s and was tolerating nasal cannula but then became more hypoxic and required nonrebreather.  Her O2 saturation went down to the 70s and she became more agitated and distressed.  The patient denies pain.  She is unable to give much other history due to her shortness of breath.  Past Medical History:  Diagnosis Date  . Asthma   . CAD (coronary artery disease)   . Chronic lower back pain   . COPD (chronic obstructive pulmonary disease) ([place])   . Depression   . Diabetes mellitus without complication (HCC)    NIDD  . H/O angina pectoris   . H/O blood clots   . Hypercholesteremia   . Hypertension   . Left rotator cuff tear   . Myocardial infarction ([place])   . Obesity   . Renal insufficiency   . Stroke ([place])   . Vertigo, aural     Patient Active Problem List   Diagnosis Date Noted  . Acute on chronic congestive heart failure ([place])   . ESRD on hemodialysis ([place])   . Palliative care by specialist   . Acute respiratory failure with hypoxemia ([place]) 04/18/2018  . Anemia of chronic disease 03/24/2018  . NSTEMI (non-ST elevated myocardial infarction) ([place]) 03/23/2018  . Acute on chronic diastolic CHF (congestive heart failure) ([place]) 03/02/2018  . GERD (gastroesophageal reflux disease) 03/02/2018  . Respiratory failure  ([place]) 01/11/2018  . Abdominal pain 06/28/2017  . Anxiety, generalized 06/27/2017  . COPD (chronic obstructive pulmonary disease) ([place]) 06/26/2017  . Peritonitis ([place]) 05/28/2017  . Bilateral carotid artery stenosis 03/08/2017  . ESRD on dialysis ([place]) 03/08/2017  . Non-ST elevation myocardial infarction (NSTEMI), subendocardial infarction, subsequent episode of care ([place]) 11/04/2016  . Onychogryphosis 10/31/2016  . Onychomycosis 10/31/2016  . Subungual exostosis 10/31/2016  . Type 2 diabetes mellitus with diabetic neuropathy ([place]) 10/31/2016  . Chronic osteomyelitis of left hand ([place]) 09/03/2016  . Left arm swelling 08/30/2016  . Pre-operative clearance 08/30/2016  . Coronary artery disease of native artery of native heart with stable angina pectoris ([place]) 08/03/2016  . Benign paroxysmal positional vertigo due to bilateral vestibular disorder 07/26/2016  . H/O: CVA (cerebrovascular accident) 07/26/2016  . Stable angina ([place]) 07/05/2016  . Sepsis due to pneumonia ([place]) 07/05/2016  . Fracture of left foot 04/26/2016  . Pre-transplant evaluation for kidney transplant 02/04/2016  . Chest pain at rest 12/17/2015  . Chest pain 12/17/2015  . DJD of shoulder 10/21/2015  . Perianal lesion 05/20/2015  . 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 Lumbar radiculopathy 05/14/2015  . 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 Benign essential hypertension 04/22/2015  . Grief 03/18/2015  . Lumbosacral facet joint syndrome 02/16/2015  . Greater trochanteric bursitis 02/16/2015  . DDD (degenerative disc disease), lumbosacral 01/20/2015  . 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  Osteoarthritis 01/20/2015  . IDA (iron deficiency anemia) 03/25/2014  . Thyroid nodule 01/15/2014  . Lung nodule 08/28/2013  . Tobacco abuse 05/17/2013  . Mixed hyperlipidemia 06/14/2011  . OSA on CPAP 06/14/2011  . PAD (peripheral artery disease) ([place]) 06/14/2011  . Depression  05/05/2011  . Chronic kidney disease (CKD), stage V ([place]) 03/26/2011  . Multiple vessel coronary artery disease 03/26/2011  . Obesity, unspecified 03/26/2011  . Type 2 diabetes mellitus ([place]) 03/26/2011    Past Surgical History:  Procedure Laterality Date  . A/V FISTULAGRAM Left 03/14/2017   Procedure: A/V Fistulagram;  Surgeon: Katha Cabal, MD;  Location: [facility];  Service: Cardiovascular;  Laterality: Left;  . AV FISTULA PLACEMENT Left 11-04-2015  . AV FISTULA PLACEMENT Left 11/04/2015   Procedure: ARTERIOVENOUS (AV) FISTULA CREATION  ( BRACHIAL CEPHALIC );  Surgeon: Katha Cabal, MD;  Location: [facility];  Service: Vascular;  Laterality: Left;  . CARDIAC CATHETERIZATION    . CARDIAC CATHETERIZATION Left 07/19/2016   Procedure: Left Heart Cath and Coronary Angiography;  Surgeon: Corey Skains, MD;  Location: [facility];  Service: Cardiovascular;  Laterality: Left;  . CORONARY ARTERY BYPASS GRAFT  2012  . CORONARY STENT PLACEMENT    . JOINT REPLACEMENT    . KNEE SURGERY Bilateral   . LEFT HEART CATH AND CORONARY ANGIOGRAPHY N/A 12/25/2017   Procedure: LEFT HEART CATH AND CORONARY ANGIOGRAPHY;  Surgeon: Teodoro Spray, MD;  Location: [facility];  Service: Cardiovascular;  Laterality: N/A;  . LEFT HEART CATH AND CORONARY ANGIOGRAPHY Right 03/23/2018   Procedure: LEFT HEART CATH AND CORONARY ANGIOGRAPHY;  Surgeon: Dionisio David, MD;  Location: [facility];  Service: Cardiovascular;  Laterality: Right;  . LEFT HEART CATH AND CORONARY ANGIOGRAPHY N/A 05/30/2018   Procedure: LEFT HEART CATH AND CORONARY ANGIOGRAPHY;  Surgeon: Corey Skains, MD;  Location: [facility];  Service: Cardiovascular;  Laterality: N/A;  . PERIPHERAL VASCULAR CATHETERIZATION N/A 11/13/2015   Procedure: Dialysis/Perma Catheter Insertion;  Surgeon: Katha Cabal, MD;  Location: [facility];  Service: Cardiovascular;  Laterality: N/A;  .  PERIPHERAL VASCULAR CATHETERIZATION N/A 12/04/2015   Procedure: Dialysis/Perma Catheter Insertion;  Surgeon: Katha Cabal, MD;  Location: [facility];  Service: Cardiovascular;  Laterality: N/A;  . PERIPHERAL VASCULAR CATHETERIZATION Left 12/31/2015   Procedure: A/V Shuntogram/Fistulagram;  Surgeon: Algernon Huxley, MD;  Location: [facility];  Service: Cardiovascular;  Laterality: Left;  . PERIPHERAL VASCULAR CATHETERIZATION N/A 12/31/2015   Procedure: A/V Shunt Intervention;  Surgeon: Algernon Huxley, MD;  Location: [facility];  Service: Cardiovascular;  Laterality: N/A;  . PERIPHERAL VASCULAR CATHETERIZATION Left 02/25/2016   Procedure: A/V Shuntogram/Fistulagram;  Surgeon: Algernon Huxley, MD;  Location: [facility];  Service: Cardiovascular;  Laterality: Left;  . PERIPHERAL VASCULAR CATHETERIZATION N/A 03/18/2016   Procedure: Dialysis/Perma Catheter Removal;  Surgeon: Katha Cabal, MD;  Location: [facility];  Service: Cardiovascular;  Laterality: N/A;  . REMOVAL OF A DIALYSIS CATHETER N/A 06/30/2017   Procedure: REMOVAL OF A DIALYSIS CATHETER;  Surgeon: Katha Cabal, MD;  Location: [facility];  Service: Vascular;  Laterality: N/A;  . REPLACEMENT TOTAL KNEE BILATERAL Bilateral     Prior to Admission medications   Medication Sig Start Date End Date Taking? Authorizing Provider  acetaminophen (TYLENOL) 325 MG tablet Take 2 tablets (650 mg total) by mouth every 6 (six) hours as  needed for mild pain (or Fever >/= 101). 05/30/18   Awilda Bill, NP  albuterol (PROVENTIL) (2.5 MG/3ML) 0.083% nebulizer solution Take 3 mLs (2.5 mg total) by nebulization every 4 (four) hours as needed for wheezing or shortness of breath. 05/30/18   Awilda Bill, NP  alteplase (CATHFLO ACTIVASE) 2 MG injection 2 mg by Intracatheter route once as needed for open catheter. 05/30/18   Awilda Bill, NP  aspirin 81 MG chewable tablet Chew 81 mg by mouth daily.    [provider]  atorvastatin (LIPITOR) 40 MG tablet Take 40 mg by mouth at bedtime. 03/28/18   [provider]  calcium carbonate (TUMS - DOSED IN MG ELEMENTAL CALCIUM) 500 MG chewable tablet Chew 2 tablets by mouth 3 (three) times daily.    [provider]  Chlorhexidine Gluconate Cloth 2 % PADS Apply 6 each topically daily at 6 (six) AM. 05/31/18   Awilda Bill, NP  cinacalcet (SENSIPAR) 30 MG tablet Take 1 tablet (30 mg total) by mouth daily before breakfast. 05/31/18   Awilda Bill, NP  clopidogrel (PLAVIX) 75 MG tablet Take 1 tablet (75 mg total) by mouth daily. 05/31/18   Awilda Bill, NP  docusate sodium (COLACE) 100 MG capsule Take 1 capsule (100 mg total) by mouth 2 (two) times daily. 05/30/18   Awilda Bill, NP  doxycycline (VIBRA-TABS) 100 MG tablet Take 1 tablet (100 mg total) by mouth 2 (two) times daily. 05/30/18   Awilda Bill, NP  fluticasone (FLONASE) 50 MCG/ACT nasal spray Place 2 sprays into both nostrils 2 (two) times daily.    [provider]  fluticasone furoate-vilanterol (BREO ELLIPTA) 200-25 MCG/INH AEPB Inhale 1 puff into the lungs daily. 05/31/18   Awilda Bill, NP  folic acid (FOLVITE) 1 MG tablet Take 1 mg by mouth daily.    [provider]  heparin 100-0.45 UNIT/ML-% infusion Inject 1,100 Units/hr into the vein continuous. 05/30/18   Awilda Bill, NP  heparin 1000 unit/mL SOLN injection 1 mL (1,000 Units total) by Dialysis route as needed (in dialysis). 05/30/18   Awilda Bill, NP  lidocaine (LIDODERM) 5 % Place 1 patch onto the skin daily. Remove & Discard patch within 12 hours or as directed by MD 05/31/18   Awilda Bill, NP  lidocaine, PF, (XYLOCAINE) 1 % SOLN injection Inject 5 mLs into the skin as needed (topical anesthesia for hemodialysis ifGEBAUERS is ineffective.). 05/30/18   Awilda Bill, NP  lidocaine-prilocaine (EMLA) cream Apply 1 application topically as needed (topical anesthesia for  hemodialysis if Gebauers and Lidocaine injection are ineffective.). 05/30/18   Awilda Bill, NP  lubiprostone (AMITIZA) 24 MCG capsule Take 24 mcg by mouth 2 (two) times daily with a meal.    [provider]  meclizine (ANTIVERT) 25 MG tablet Take 25 mg by mouth 3 (three) times daily as needed for dizziness.    [provider]  metoprolol succinate (TOPROL-XL) 100 MG 24 hr tablet Take 1 tablet (100 mg total) by mouth daily with breakfast. Take with or immediately following a meal. 05/31/18   Awilda Bill, NP  multivitamin (RENA-VIT) TABS tablet Take 1 tablet by mouth at bedtime. 05/30/18   Awilda Bill, NP  nitroGLYCERIN (NITROSTAT) 0.4 MG SL tablet Place 1 tablet (0.4 mg total) under the tongue every 5 (five) minutes as needed for chest pain. 05/30/18   Awilda Bill, NP  Nutritional Supplements (FEEDING SUPPLEMENT, NEPRO  CARB STEADY,) LIQD Take 237 mLs by mouth daily. 05/31/18   Awilda Bill, NP  nystatin (MYCOSTATIN) 100000 UNIT/ML suspension Use as directed 5 mLs in the mouth or throat 4 (four) times daily.    [provider]  ondansetron (ZOFRAN) 4 MG tablet Take 1 tablet (4 mg total) by mouth every 6 (six) hours as needed for nausea. 05/30/18   Awilda Bill, NP  ondansetron (ZOFRAN) 4 MG/2ML SOLN injection Inject 2 mLs (4 mg total) into the vein every 6 (six) hours as needed for nausea. 05/30/18   Awilda Bill, NP  pantoprazole (PROTONIX) 40 MG tablet Take 40 mg by mouth 2 (two) times daily.    [provider]  pentafluoroprop-tetrafluoroeth [place]ke) AERO Apply 1 application topically as needed (topical anesthesia for hemodialysis). 05/30/18   Awilda Bill, NP  polyethylene glycol (MIRALAX / GLYCOLAX) packet Take 17 g by mouth daily. 05/31/18   Awilda Bill, NP  ranolazine (RANEXA) 500 MG 12 hr tablet Take 500 mg by mouth 2 (two) times daily. 04/13/18   [provider]  sevelamer carbonate (RENVELA) 2.4 g PACK Take 2.4 g by  mouth 2 (two) times daily with a meal. 05/31/18   Awilda Bill, NP  sodium chloride 0.9 % infusion Inject 100 mLs into the vein as needed (symptomatic hypotension or decrease in SBP < 90 mmHg). 05/30/18   Awilda Bill, NP  sodium chloride 0.9 % infusion Inject 100 mLs into the vein as needed (severe cramping). 05/30/18   Awilda Bill, NP  sodium chloride HYPERTONIC 3 % nebulizer solution Take 3 mLs by nebulization as needed for other.     [provider]  sodium zirconium cyclosilicate (LOKELMA) 5 g packet Take 10 g by mouth 3 (three) times daily. 05/30/18   Awilda Bill, NP  tiotropium (SPIRIVA HANDIHALER) 18 MCG inhalation capsule Place 1 capsule (18 mcg total) into inhaler and inhale daily. 05/31/18   Awilda Bill, NP  varenicline (CHANTIX) 0.5 MG tablet Take 1 tablet (0.5 mg total) by mouth daily. 05/30/18   Awilda Bill, NP  venlafaxine XR (EFFEXOR-XR) 37.5 MG 24 hr capsule Take 37.5 mg by mouth daily with breakfast.    [provider]    Allergies Nystatin; Sulfa antibiotics; and Niaspan [niacin]  Family History  Problem Relation Age of Onset  . Cancer Mother   . Cancer Father   . Diabetes Brother   . Heart disease Brother     Social History Social History   Tobacco Use  . Smoking status: Current Every Day Smoker    Packs/day: 0.25    Types: Cigarettes  . Smokeless tobacco: Never Used  Substance Use Topics  . Alcohol use: No    Alcohol/week: 0.0 standard drinks  . Drug use: No    Review of Systems Level 5 caveat: Unable to obtain review of systems due to respiratory distress    ____________________________________________   PHYSICAL EXAM:  VITAL SIGNS: ED Triage Vitals  Enc Vitals Group     BP 06/19/18 0525 125/74     Pulse Rate 06/19/18 0519 95     Resp 06/19/18 0519 (!) 30     Temp 06/19/18 0519 98.2 F (36.8 C)     Temp Source 06/19/18 0519 Axillary     SpO2 06/19/18 0519 (!) 89 %     Weight 06/19/18 0521 221 lb  (100.2 kg)     Height 06/19/18 0521 5\' 6"  (1.676 m)  Head Circumference --      Peak Flow --      Pain Score 06/19/18 0521 0     Pain Loc --      Pain Edu? --      Excl. in [place]? --     Constitutional: Alert, tired appearing.  Extremely uncomfortable in severe respiratory distress. Eyes: Conjunctivae are normal.  Head: Atraumatic. Nose: No congestion/rhinnorhea. Mouth/Throat: Mucous membranes are dry.   Neck: Normal range of motion.  Cardiovascular: Tachycardic, regular rhythm. Grossly normal heart sounds.  Good peripheral circulation. Respiratory: Respiratory distress with retractions.  Diminished breath sounds bilaterally with faint wheezes and rhonchi in the bases. Gastrointestinal: Soft and nontender. No distention.  Genitourinary: No flank tenderness. Musculoskeletal: No significant lower extremity edema.  Extremities warm and well perfused.  Neurologic: Motor intact in all extremities. Skin:  Skin is warm and dry. No rash noted. Psychiatric: Anxious appearing.  ____________________________________________   LABS (all labs ordered are listed, but only abnormal results are displayed)  Labs Reviewed  COMPREHENSIVE METABOLIC PANEL - Abnormal; Notable for the following components:      Result Value   CO2 20 (*)    Glucose, Bld 208 (*)    BUN 46 (*)    Creatinine, Ser 7.82 (*)    Calcium 8.6 (*)    Total Protein 8.3 (*)    GFR calc non Af Amer 5 (*)    GFR calc Af Amer 5 (*)    All other components within normal limits  CBC WITH DIFFERENTIAL/PLATELET - Abnormal; Notable for the following components:   RBC 2.82 (*)    Hemoglobin 8.9 (*)    HCT 27.2 (*)    RDW 20.0 (*)    Neutro Abs 6.9 (*)    Monocytes Absolute 1.0 (*)    All other components within normal limits  TROPONIN I - Abnormal; Notable for the following components:   Troponin I 0.04 (*)    All other components within normal limits  BRAIN NATRIURETIC PEPTIDE - Abnormal; Notable for the following  components:   B Natriuretic Peptide 2,901.0 (*)    All other components within normal limits  LACTIC ACID, PLASMA - Abnormal; Notable for the following components:   Lactic Acid, Venous 2.1 (*)    All other components within normal limits  BLOOD GAS, VENOUS - Abnormal; Notable for the following components:   pH, Ven 7.17 (*)    pO2, Ven 47.0 (*)    Acid-base deficit 7.8 (*)    All other components within normal limits  LACTIC ACID, PLASMA  URINALYSIS, COMPLETE (UACMP) WITH MICROSCOPIC   ____________________________________________  EKG  ED ECG REPORT I, Arta Silence, the attending physician, personally viewed and interpreted this ECG.  Date: 06/19/2018 EKG Time: 0517 Rate: 98 Rhythm: normal sinus rhythm QRS Axis: normal Intervals: Borderline prolonged QTc ST/T Wave abnormalities: LVH with repolarization abnormality and T wave inversions/ST depression laterally Narrative Interpretation: Nonspecific lateral ST abnormalities with no acute change when compared to EKG of 05/29/2018  ____________________________________________  RADIOLOGY  CXR: Cardiomegaly with interstitial edema.  No focal infiltrate.  ____________________________________________   PROCEDURES  Procedure(s) performed: No  Procedures  Critical Care performed: Yes  CRITICAL CARE Performed by: Arta Silence   Total critical care time: 30 minutes  Critical care time was exclusive of separately billable procedures and treating other patients.  Critical care was necessary to treat or prevent imminent or life-threatening deterioration.  Critical care was time spent personally by me on the following activities:  development of treatment plan with patient and/or surrogate as well as nursing, discussions with consultants, evaluation of patient's response to treatment, examination of patient, obtaining history from patient or surrogate, ordering and performing treatments and interventions, ordering  and review of laboratory studies, ordering and review of radiographic studies, pulse oximetry and re-evaluation of patient's condition. ____________________________________________   INITIAL IMPRESSION / ASSESSMENT AND PLAN / ED COURSE  Pertinent labs & imaging results that were available during my care of the patient were reviewed by me and considered in my medical decision making (see chart for details).  67 year old female with history of COPD, CHF, and ESRD on dialysis presents with acute onset of shortness of breath and hypoxia.  On ED arrival, the patient was agitated and flailing around in the bed somewhat.  She appeared tired.  She was unable to tolerate CPAP by EMS.  The patient was placed on the monitor and immediately placed on BiPAP.  With some encouragement she was able to tolerate it and her respiratory status improved.  Her O2 saturation was 100% within 1 to 2 minutes of being on the BiPAP.  The remainder of the exam is as described above.  Overall I suspect more significant component of COPD rather than pulmonary edema but will likely treat for both.  We will obtain an x-ray to rule out pneumonia or other causes, lab work-up, give steroid and nebulizer, likely give diuretic, and anticipate admission.  ----------------------------------------- 6:15 AM on 06/19/2018 -----------------------------------------  The patient has remained stable on BiPAP.  Chest x-ray shows no infiltrates.  She does have significant acidosis and an extremely elevated BNP.  I will add on Lasix as the patient states that she does make urine.  She is now able to speak in short sentences and appears more comfortable.  ----------------------------------------- 6:31 AM on 06/19/2018 -----------------------------------------  I signed the patient out to the hospitalist Dr. Jodell Cipro for admission.   ____________________________________________   FINAL CLINICAL IMPRESSION(S) / ED DIAGNOSES  Final  diagnoses:  Acute respiratory failure with hypoxia and hypercapnia (HCC)  COPD exacerbation (HCC)  Acute pulmonary edema (HCC)      NEW MEDICATIONS STARTED DURING THIS VISIT:  New Prescriptions   No medications on file     Note:  This document was prepared using Dragon voice recognition software and may include unintentional dictation errors.    Arta Silence, MD 06/19/18 [phone]

## 2018-06-19 NOTE — Progress Notes (Signed)
06/19/18 0600  Clinical Encounter Type  Visited With Patient  Visit Type Initial;Spiritual support  Referral From Nurse  Recommendations Follow-up, as needed.  Spiritual Encounters  Spiritual Needs Emotional;Prayer  Stress Factors  Patient Stress Factors Health changes   Chaplain met with patient, offering emotional and spiritual support. Patient was not able to talk easily, but was grateful for prayer and blessings for her health and healing. Patient reported that she has been a frequent patient of [facility]. Chaplain offered continuing support and will return if needed.

## 2018-06-19 NOTE — Progress Notes (Signed)
HD tx start    06/19/18 1643  Vital Signs  Pulse Rate 80  Pulse Rate Source Monitor  Resp (!) 25  BP 130/76  BP Location Right Arm  BP Method Automatic  Patient Position (if appropriate) Lying  Oxygen Therapy  SpO2 99 %  O2 Device Nasal Cannula  O2 Flow Rate (L/min) 3 L/min  During Hemodialysis Assessment  Blood Flow Rate (mL/min) 400 mL/min  Arterial Pressure (mmHg) -150 mmHg  Venous Pressure (mmHg) 210 mmHg  Transmembrane Pressure (mmHg) 70 mmHg  Ultrafiltration Rate (mL/min) 860 mL/min  Dialysate Flow Rate (mL/min) 500 ml/min  Conductivity: Machine  14  HD Safety Checks Performed Yes  Dialysis Fluid Bolus Normal Saline  Bolus Amount (mL) 250 mL  Intra-Hemodialysis Comments Tx initiated

## 2018-06-19 NOTE — Progress Notes (Signed)
Patient currently in dialysis

## 2018-06-19 NOTE — Progress Notes (Signed)
Pre HD assessment    06/19/18 1639  Neurological  Level of Consciousness Alert  Orientation Level Oriented X4  Respiratory  Respiratory Pattern Regular;Tachypnea  Chest Assessment Chest expansion symmetrical  Cough Non-productive  Cardiac  ECG Monitor Yes  Cardiac Rhythm NSR  Vascular  R Radial Pulse +2  L Radial Pulse +2  Edema Generalized  Integumentary  Integumentary (WDL) X  Skin Color Appropriate for ethnicity  Musculoskeletal  Musculoskeletal (WDL) X  Generalized Weakness Yes  Assistive Device None  GU Assessment  Genitourinary (WDL) X  Genitourinary Symptoms  (HD)  Psychosocial  Psychosocial (WDL) WDL

## 2018-06-19 NOTE — H&P (Signed)
[place] [facility] [place] NAME: Kimberly Shaffer    MR#:  [id number]  DATE OF BIRTH:  Mar 28, 1951  DATE OF ADMISSION:  06/19/2018  PRIMARY CARE PHYSICIAN: [facility]   REQUESTING/REFERRING PHYSICIAN: Arta Silence, MD  CHIEF COMPLAINT:   Chief Complaint  Patient presents with  . Respiratory Distress    HISTORY OF PRESENT ILLNESS:  Kimberly Shaffer  is a 67 y.o. female with a known history of multi-vessel CAD (per 12/25/2017 and 05/30/2018 cardiac cath, the more recent of which demonstrated a new 85% mid-LAD lesion), chronic systolic + diastolic CHF (EF 96-75% w/ grade I diastolic dysfxn as of 91/63/8466 Echo; EF 40% as of 05/30/2018 cardiac cath), COPD/asthma, DM, HTN, HLD, ESRD (HD T/T/S via LUE AVF), now p/w readmission for acute on chronic hypoxemic respiratory failure. She has spent much of her time in the care of hospitals and physicians since 12/2017.  Admissions to [facility]: -04/06 to 04/08 -04/25 to 04/28 -06/10 to 06/11 -06/14-06/15 -07/02-07/03 -07/05-07/05 -07/31-08/01 -09/10-09/11  Admissions to [facility]: -07/05 -08/06 -09/11  05/30/2018 cardiac cath report: " -Ost LAD to Mid LAD lesion is 20% stenosed. -Dist LAD-1 lesion is 70% stenosed. -Dist LAD-2 lesion is 65% stenosed. Colon Flattery 2nd Diag to 2nd Diag lesion is 45% stenosed. -Ramus lesion is 75% stenosed. -Ost Cx to Dist Cx lesion is 20% stenosed. -Dist Cx lesion is 30% stenosed. -Prox RCA lesion is 100% stenosed. -Dist RCA lesion is 100% stenosed. -Prox Graft lesion is 100% stenosed. -Ost 1st Mrg lesion is 70% stenosed. -Mid LAD lesion is 85% stenosed.   Assessment -The patient has had an acute non-ST elevation myocardial infarction with causes and risk factors including diabetes, high blood pressure and high cholesterol. -Left ventricular angiogram shows abnormal LV systolic function ejection fraction of 40%. There is hypokinesis of the inferior and  inferoapical walls. -Severe 3 vessel coronary artery disease. -There is occlusion of the right coronary artery with collateralization unchanged from before. -There is stenting of the proximal left anterior descending artery patent and unchanged. -There is new significant mid LAD stenosis of 85% possibly culprit lesion. -There is a patent stent of the proximal left circumflex artery unchanged from before with possible slight increase in stenosis of ostial obtuse marginal 1. "  Pt on BiPAP at the time of my assessment. Hx/ROS limited. She was reportedly woken from sleep w/ acute dyspnea. EMS was called. Initial SpO2 in 90s, but pt subsequently decompensated. She gesticulates to me that her breathing has improved with NIPPV since coming to the ED. She tells me she still makes urine. She denies missing hemodialysis. She states she is supposed to see her Cardiologist soon. She is volume overloaded, w/ elevated BNP, and CXR demonstrating pulmonary edema and pleural effusion. She also exhibits rhonchi and wheezing on lung exam. She is not in active respiratory distress at the time of my assessment, and is breathing comfortably on BiPAP. She will be treated for COPD, CHF and ESRD. The mainstay of her management will be hemodialysis. I do not believe she is infected at present. I checked a PCT to lend credence to this theory, and have found it to be reassuringly low, largely confirming my suspicions. Admit to Stepdown.  PAST MEDICAL HISTORY:   Past Medical History:  Diagnosis Date  . Asthma   . CAD (coronary artery disease)   . Chronic lower back pain   . COPD (chronic obstructive pulmonary disease) ([place])   . Depression   .  Diabetes mellitus without complication (HCC)    NIDD  . H/O angina pectoris   . H/O blood clots   . Hypercholesteremia   . Hypertension   . Left rotator cuff tear   . Myocardial infarction ([place])   . Obesity   . Renal insufficiency   . Stroke ([place])   . Vertigo, aural      PAST SURGICAL HISTORY:   Past Surgical History:  Procedure Laterality Date  . A/V FISTULAGRAM Left 03/14/2017   Procedure: A/V Fistulagram;  Surgeon: Katha Cabal, MD;  Location: [place] [facility];  Service: Cardiovascular;  Laterality: Left;  . AV FISTULA PLACEMENT Left 11-04-2015  . AV FISTULA PLACEMENT Left 11/04/2015   Procedure: ARTERIOVENOUS (AV) FISTULA CREATION  ( BRACHIAL CEPHALIC );  Surgeon: Katha Cabal, MD;  Location: [facility];  Service: Vascular;  Laterality: Left;  . CARDIAC CATHETERIZATION    . CARDIAC CATHETERIZATION Left 07/19/2016   Procedure: Left Heart Cath and Coronary Angiography;  Surgeon: Corey Skains, MD;  Location: [place] [facility];  Service: Cardiovascular;  Laterality: Left;  . CORONARY ARTERY BYPASS GRAFT  2012  . CORONARY STENT PLACEMENT    . JOINT REPLACEMENT    . KNEE SURGERY Bilateral   . LEFT HEART CATH AND CORONARY ANGIOGRAPHY N/A 12/25/2017   Procedure: LEFT HEART CATH AND CORONARY ANGIOGRAPHY;  Surgeon: Teodoro Spray, MD;  Location: [place] [facility];  Service: Cardiovascular;  Laterality: N/A;  . LEFT HEART CATH AND CORONARY ANGIOGRAPHY Right 03/23/2018   Procedure: LEFT HEART CATH AND CORONARY ANGIOGRAPHY;  Surgeon: Dionisio David, MD;  Location: [place] [facility];  Service: Cardiovascular;  Laterality: Right;  . LEFT HEART CATH AND CORONARY ANGIOGRAPHY N/A 05/30/2018   Procedure: LEFT HEART CATH AND CORONARY ANGIOGRAPHY;  Surgeon: Corey Skains, MD;  Location: [place] [facility];  Service: Cardiovascular;  Laterality: N/A;  . PERIPHERAL VASCULAR CATHETERIZATION N/A 11/13/2015   Procedure: Dialysis/Perma Catheter Insertion;  Surgeon: Katha Cabal, MD;  Location: [place] [facility];  Service: Cardiovascular;  Laterality: N/A;  . PERIPHERAL VASCULAR CATHETERIZATION N/A 12/04/2015   Procedure: Dialysis/Perma Catheter Insertion;  Surgeon: Katha Cabal, MD;  Location: [place] [facility];  Service:  Cardiovascular;  Laterality: N/A;  . PERIPHERAL VASCULAR CATHETERIZATION Left 12/31/2015   Procedure: A/V Shuntogram/Fistulagram;  Surgeon: Algernon Huxley, MD;  Location: [place] [facility];  Service: Cardiovascular;  Laterality: Left;  . PERIPHERAL VASCULAR CATHETERIZATION N/A 12/31/2015   Procedure: A/V Shunt Intervention;  Surgeon: Algernon Huxley, MD;  Location: [place] [facility];  Service: Cardiovascular;  Laterality: N/A;  . PERIPHERAL VASCULAR CATHETERIZATION Left 02/25/2016   Procedure: A/V Shuntogram/Fistulagram;  Surgeon: Algernon Huxley, MD;  Location: [place] [facility];  Service: Cardiovascular;  Laterality: Left;  . PERIPHERAL VASCULAR CATHETERIZATION N/A 03/18/2016   Procedure: Dialysis/Perma Catheter Removal;  Surgeon: Katha Cabal, MD;  Location: [place] [facility];  Service: Cardiovascular;  Laterality: N/A;  . REMOVAL OF A DIALYSIS CATHETER N/A 06/30/2017   Procedure: REMOVAL OF A DIALYSIS CATHETER;  Surgeon: Katha Cabal, MD;  Location: [facility];  Service: Vascular;  Laterality: N/A;  . REPLACEMENT TOTAL KNEE BILATERAL Bilateral     SOCIAL HISTORY:   Social History   Tobacco Use  . Smoking status: Current Every Day Smoker    Packs/day: 0.25    Types: Cigarettes  . Smokeless tobacco: Never Used  Substance Use Topics  . Alcohol use: No  Alcohol/week: 0.0 standard drinks    FAMILY HISTORY:   Family History  Problem Relation Age of Onset  . Cancer Mother   . Cancer Father   . Diabetes Brother   . Heart disease Brother     DRUG ALLERGIES:   Allergies  Allergen Reactions  . Nystatin Hives and Itching  . Sulfa Antibiotics Swelling, Hives and Rash  . Niaspan [Niacin] Other (See Comments) and Hives    REVIEW OF SYSTEMS:   Review of Systems  Unable to perform ROS: Severe respiratory distress  Respiratory: Positive for shortness of breath.    On BiPAP. MEDICATIONS AT HOME:   Prior to Admission medications   Medication Sig Start Date End Date  Taking? Authorizing Provider  acetaminophen (TYLENOL) 325 MG tablet Take 2 tablets (650 mg total) by mouth every 6 (six) hours as needed for mild pain (or Fever >/= 101). 05/30/18   Awilda Bill, NP  albuterol (PROVENTIL) (2.5 MG/3ML) 0.083% nebulizer solution Take 3 mLs (2.5 mg total) by nebulization every 4 (four) hours as needed for wheezing or shortness of breath. 05/30/18   Awilda Bill, NP  alteplase (CATHFLO ACTIVASE) 2 MG injection 2 mg by Intracatheter route once as needed for open catheter. 05/30/18   Awilda Bill, NP  aspirin 81 MG chewable tablet Chew 81 mg by mouth daily.    [provider]  atorvastatin (LIPITOR) 40 MG tablet Take 40 mg by mouth at bedtime. 03/28/18   [provider]  calcium carbonate (TUMS - DOSED IN MG ELEMENTAL CALCIUM) 500 MG chewable tablet Chew 2 tablets by mouth 3 (three) times daily.    [provider]  Chlorhexidine Gluconate Cloth 2 % PADS Apply 6 each topically daily at 6 (six) AM. 05/31/18   Awilda Bill, NP  cinacalcet (SENSIPAR) 30 MG tablet Take 1 tablet (30 mg total) by mouth daily before breakfast. 05/31/18   Awilda Bill, NP  clopidogrel (PLAVIX) 75 MG tablet Take 1 tablet (75 mg total) by mouth daily. 05/31/18   Awilda Bill, NP  docusate sodium (COLACE) 100 MG capsule Take 1 capsule (100 mg total) by mouth 2 (two) times daily. 05/30/18   Awilda Bill, NP  doxycycline (VIBRA-TABS) 100 MG tablet Take 1 tablet (100 mg total) by mouth 2 (two) times daily. 05/30/18   Awilda Bill, NP  fluticasone (FLONASE) 50 MCG/ACT nasal spray Place 2 sprays into both nostrils 2 (two) times daily.    [provider]  fluticasone furoate-vilanterol (BREO ELLIPTA) 200-25 MCG/INH AEPB Inhale 1 puff into the lungs daily. 05/31/18   Awilda Bill, NP  folic acid (FOLVITE) 1 MG tablet Take 1 mg by mouth daily.    [provider]  heparin 100-0.45 UNIT/ML-% infusion Inject 1,100 Units/hr into the vein  continuous. 05/30/18   Awilda Bill, NP  heparin 1000 unit/mL SOLN injection 1 mL (1,000 Units total) by Dialysis route as needed (in dialysis). 05/30/18   Awilda Bill, NP  lidocaine (LIDODERM) 5 % Place 1 patch onto the skin daily. Remove & Discard patch within 12 hours or as directed by MD 05/31/18   Awilda Bill, NP  lidocaine, PF, (XYLOCAINE) 1 % SOLN injection Inject 5 mLs into the skin as needed (topical anesthesia for hemodialysis ifGEBAUERS is ineffective.). 05/30/18   Awilda Bill, NP  lidocaine-prilocaine (EMLA) cream Apply 1 application topically as needed (topical anesthesia for hemodialysis if Gebauers and Lidocaine injection are ineffective.). 05/30/18  Awilda Bill, NP  lubiprostone (AMITIZA) 24 MCG capsule Take 24 mcg by mouth 2 (two) times daily with a meal.    [provider]  meclizine (ANTIVERT) 25 MG tablet Take 25 mg by mouth 3 (three) times daily as needed for dizziness.    [provider]  metoprolol succinate (TOPROL-XL) 100 MG 24 hr tablet Take 1 tablet (100 mg total) by mouth daily with breakfast. Take with or immediately following a meal. 05/31/18   Awilda Bill, NP  multivitamin (RENA-VIT) TABS tablet Take 1 tablet by mouth at bedtime. 05/30/18   Awilda Bill, NP  nitroGLYCERIN (NITROSTAT) 0.4 MG SL tablet Place 1 tablet (0.4 mg total) under the tongue every 5 (five) minutes as needed for chest pain. 05/30/18   Awilda Bill, NP  Nutritional Supplements (FEEDING SUPPLEMENT, NEPRO CARB STEADY,) LIQD Take 237 mLs by mouth daily. 05/31/18   Awilda Bill, NP  nystatin (MYCOSTATIN) 100000 UNIT/ML suspension Use as directed 5 mLs in the mouth or throat 4 (four) times daily.    [provider]  ondansetron (ZOFRAN) 4 MG tablet Take 1 tablet (4 mg total) by mouth every 6 (six) hours as needed for nausea. 05/30/18   Awilda Bill, NP  ondansetron (ZOFRAN) 4 MG/2ML SOLN injection Inject 2 mLs (4 mg total) into the vein every 6  (six) hours as needed for nausea. 05/30/18   Awilda Bill, NP  pantoprazole (PROTONIX) 40 MG tablet Take 40 mg by mouth 2 (two) times daily.    [provider]  pentafluoroprop-tetrafluoroeth Landry Dyke) AERO Apply 1 application topically as needed (topical anesthesia for hemodialysis). 05/30/18   Awilda Bill, NP  polyethylene glycol (MIRALAX / GLYCOLAX) packet Take 17 g by mouth daily. 05/31/18   Awilda Bill, NP  ranolazine (RANEXA) 500 MG 12 hr tablet Take 500 mg by mouth 2 (two) times daily. 04/13/18   [provider]  sevelamer carbonate (RENVELA) 2.4 g PACK Take 2.4 g by mouth 2 (two) times daily with a meal. 05/31/18   Awilda Bill, NP  sodium chloride 0.9 % infusion Inject 100 mLs into the vein as needed (symptomatic hypotension or decrease in SBP < 90 mmHg). 05/30/18   Awilda Bill, NP  sodium chloride 0.9 % infusion Inject 100 mLs into the vein as needed (severe cramping). 05/30/18   Awilda Bill, NP  sodium chloride HYPERTONIC 3 % nebulizer solution Take 3 mLs by nebulization as needed for other.     [provider]  sodium zirconium cyclosilicate (LOKELMA) 5 g packet Take 10 g by mouth 3 (three) times daily. 05/30/18   Awilda Bill, NP  tiotropium (SPIRIVA HANDIHALER) 18 MCG inhalation capsule Place 1 capsule (18 mcg total) into inhaler and inhale daily. 05/31/18   Awilda Bill, NP  varenicline (CHANTIX) 0.5 MG tablet Take 1 tablet (0.5 mg total) by mouth daily. 05/30/18   Awilda Bill, NP  venlafaxine XR (EFFEXOR-XR) 37.5 MG 24 hr capsule Take 37.5 mg by mouth daily with breakfast.    [provider]      VITAL SIGNS:  Blood pressure (!) 149/76, pulse 85, temperature 98.2 F (36.8 C), temperature source Axillary, resp. rate (!) 21, height 5\' 6"  (1.676 m), weight 100.2 kg, SpO2 100 %.  PHYSICAL EXAMINATION:  Physical Exam  Constitutional: She appears well-developed and well-nourished. She is active and cooperative.   Non-toxic appearance. She has a sickly appearance. She appears ill. No distress. She  is not intubated. Face mask in place.  HENT:  Head: Normocephalic and atraumatic.  Mouth/Throat: Oropharynx is clear and moist. No oropharyngeal exudate.  Eyes: Conjunctivae, EOM and lids are normal. No scleral icterus.  Neck: Neck supple. No JVD present. No thyromegaly present.  Cardiovascular: Normal rate, regular rhythm, S1 normal and S2 normal.  No extrasystoles are present. Exam reveals no gallop, no S3, no S4, no distant heart sounds and no friction rub.  Murmur heard.  Systolic murmur is present with a grade of 2/6. Pulmonary/Chest: Effort normal. No accessory muscle usage or stridor. Tachypnea noted. No apnea and no bradypnea. She is not intubated. No respiratory distress. She has decreased breath sounds in the right upper field, the right middle field, the right lower field, the left upper field, the left middle field and the left lower field. She has wheezes in the right upper field, the right middle field, the left upper field and the left middle field. She has rhonchi in the right upper field, the right middle field, the left upper field and the left middle field. She has rales in the right lower field and the left lower field.  Abdominal: Soft. Bowel sounds are normal. She exhibits no distension. There is no tenderness. There is no rigidity, no rebound and no guarding.  Musculoskeletal: Normal range of motion. She exhibits no edema or tenderness.  Lymphadenopathy:    She has no cervical adenopathy.  Neurological: She is alert. She is not disoriented.  (+) BiPAP.  Skin: Skin is warm and dry. No rash noted. She is not diaphoretic. No erythema.  Psychiatric: Her behavior is normal. Judgment normal.  (+) BiPAP. She is attentive.   Bibasilar fine crackles, diffuse coarse rhonchi + end-expiratory coarse wheezing. LABORATORY PANEL:   CBC Recent Labs  Lab 06/19/18 0522  WBC 10.4  HGB 8.9*  HCT 27.2*   PLT 185   ------------------------------------------------------------------------------------------------------------------  Chemistries  Recent Labs  Lab 06/19/18 0522  NA 135  K 4.6  CL 103  CO2 20*  GLUCOSE 208*  BUN 46*  CREATININE 7.82*  CALCIUM 8.6*  AST 22  ALT 11  ALKPHOS 65  BILITOT 0.9   ------------------------------------------------------------------------------------------------------------------  Cardiac Enzymes Recent Labs  Lab 06/19/18 0824  TROPONINI 0.05*   ------------------------------------------------------------------------------------------------------------------  RADIOLOGY:  Dg Chest Portable 1 View  Result Date: 06/19/2018 CLINICAL DATA:  Respiratory distress.  Hypoxia.  History of COPD. EXAM: PORTABLE CHEST 1 VIEW COMPARISON:  Chest radiograph May 29, 2018 FINDINGS: Cardiac silhouette remains moderately enlarged. Coronary artery calcification or stent. Calcified aortic arch. Similar fullness of the hila most compatible with vascular shadows. Diffuse interstitial prominence confluent in lung bases with small pleural effusions. Chronic LEFT lung herniation. No pneumothorax. Osseous structures are non suspicious. IMPRESSION: 1. Stable cardiomegaly with interstitial edema, confluent in lung bases. Small pleural effusions. 2. Fullness of the hila most compatible with vascular shadows. Electronically Signed   By: Elon Alas M.D.   On: 06/19/2018 05:46   IMPRESSION AND PLAN:   A/P: 47F w/ numerous hospital readmissions, now w/ readmission for acute on subacute on subchronic on chronic hypoxemic respiratory failure 2/2 acute on chronic combined systolic + diastolic CHF, acute COPD/asthma exacerbation and ESRD w/ volume overload. On BiPAP. Hyperglycemia (w/ DM), azotemia, hypocalcemia, hypoproteinemia, BNP elevation, Troponin elevation, normocytic anemia. -HRF, CHF, COPD/asthma, ESRD, volume overload: Acute respiratory decompensation mediated  by combined cardiovascular, pulmonary and renal disease. Volume overloaded, BNP elevated, CXR (+) edema/effusions. (+) rhonchi/wheezing on lung exam. BiPAP, SpO2 stable. Received  diuretics in ED. Nephrology for HD. Continuous cardiac monitoring, pulse oximetry. Nebs, steroids (IV, transition to PO), Azithromycin (indicated in moderate/severe acute COPD exacerbation), incentive spirometry, pulmonary toileting. c/w LABA/ICS. I&O, daily weight, free water restricted diet, c/w cardiac medications. -Hyperglycemia, DM: SSI. -Hypocalcemia: Ionized calcium. -Troponin elevation: Trop-I 0.04, 0.05. Likely 2/2 demand/strain in the setting of hypoxia, impaired renal clearance. Continuous cardiac monitoring. c/w cardiac meds. -Normocytic anemia: Hgb 8.9, stable. 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 No evidence of acute blood loss at present time. -c/w other home meds. -FEN/GI: Renal diabetic free water restricted diet as tolerated. -DVT PPx: Heparin. -Code status: DNR/DNI. -Disposition: Admission, > 2 midnights. -Philosophy of care: I had a very honest conversation w/ the pt regarding her overall condition and prognosis. She has end-stage renal disease, as well as severe atherosclerotic cardiovascular disease, both of which I would classify as end-stage conditions. I unfortunately suspect her remaining longevity to be measurable in a range of weeks to months (as opposed to a range of months to years). Based on prognosis, I am of the professional opinion that she is likely hospice-appropriate. She seems to understand this, and tells me her affairs are in order. I have confirmed that she wishes to be DNR/DNI.   All the records are reviewed and case discussed with ED provider. Management plans discussed with the patient, family and they are in agreement.  CODE STATUS: DNR/DNI.  TOTAL TIME TAKING CARE OF THIS PATIENT: 110 minutes.    Arta Silence M.D on 06/19/2018 at 9:21 AM  Between 7am to  6pm - Pager - [phone]  After 6pm go to [email or web address] - Proofreader  [facility]  Office  [phone]  CC: Primary care physician; [facility]   Note: This dictation was prepared with Dragon dictation along with smaller phrase technology. Any transcriptional errors that result from this process are unintentional.

## 2018-06-19 NOTE — ED Notes (Signed)
Pt resting quietly with eyes closed, resp even/unlab

## 2018-06-19 NOTE — Progress Notes (Signed)
HD tx end    06/19/18 2025  Vital Signs  Pulse Rate 86  Pulse Rate Source Monitor  Resp (!) 22  BP (!) 155/74  BP Location Right Arm  BP Method Automatic  Patient Position (if appropriate) Lying  Oxygen Therapy  SpO2 100 %  O2 Device Nasal Cannula  O2 Flow Rate (L/min) 3 L/min  During Hemodialysis Assessment  Dialysis Fluid Bolus Normal Saline  Bolus Amount (mL) 250 mL  Intra-Hemodialysis Comments Tx completed

## 2018-06-19 NOTE — ED Notes (Signed)
Bipap removed per attending MD request. Pt given water to drink as well. Pt placed on 4L [place]. Pt states she usually wears 2L [place] at home.  Pt reports feeling better after being on Bipap.

## 2018-06-19 NOTE — Progress Notes (Signed)
IS education attempted, pt going to dialysis directly after breathing tx.

## 2018-06-20 LAB — BASIC METABOLIC PANEL
Anion gap: 13 (ref 5–15)
BUN: 24 mg/dL — ABNORMAL HIGH (ref 8–23)
CALCIUM: 8.8 mg/dL — AB (ref 8.9–10.3)
CO2: 28 mmol/L (ref 22–32)
CREATININE: 4.85 mg/dL — AB (ref 0.44–1.00)
Chloride: 97 mmol/L — ABNORMAL LOW (ref 98–111)
GFR calc non Af Amer: 8 mL/min — ABNORMAL LOW (ref >60)
GFR, EST AFRICAN AMERICAN: 10 mL/min — AB (ref >60)
Glucose, Bld: 111 mg/dL — ABNORMAL HIGH (ref 70–99)
Potassium: 3.7 mmol/L (ref 3.5–5.1)
SODIUM: 138 mmol/L (ref 135–145)

## 2018-06-20 LAB — GLUCOSE, CAPILLARY
GLUCOSE-CAPILLARY: 111 mg/dL — AB (ref 70–99)
Glucose-Capillary: 116 mg/dL — ABNORMAL HIGH (ref 70–99)
Glucose-Capillary: 136 mg/dL — ABNORMAL HIGH (ref 70–99)

## 2018-06-20 LAB — CBC
HCT: 25 % — ABNORMAL LOW (ref 35.0–47.0)
Hemoglobin: 8.4 g/dL — ABNORMAL LOW (ref 12.0–16.0)
MCH: 31.6 pg (ref 26.0–34.0)
MCHC: 33.7 g/dL (ref 32.0–36.0)
MCV: 93.8 fL (ref 80.0–100.0)
Platelets: 194 10*3/uL (ref 150–440)
RBC: 2.67 MIL/uL — ABNORMAL LOW (ref 3.80–5.20)
RDW: 20 % — AB (ref 11.5–14.5)
WBC: 9.2 10*3/uL (ref 3.6–11.0)

## 2018-06-20 LAB — PHOSPHORUS: Phosphorus: 4.4 mg/dL (ref 2.5–4.6)

## 2018-06-20 MED ORDER — IPRATROPIUM-ALBUTEROL 0.5-2.5 (3) MG/3ML IN SOLN: 3.0000 mL | RESPIRATORY_TRACT | Status: DC | PRN

## 2018-06-20 MED ORDER — AMLODIPINE BESYLATE 10 MG PO TABS
10.0000 mg | ORAL_TABLET | Freq: Every day | ORAL | Status: DC
  Administered 2018-06-20: 10 mg via ORAL
  Filled 2018-06-20 (×2): qty 1

## 2018-06-20 MED ORDER — SEVELAMER CARBONATE 800 MG PO TABS
1600.0000 mg | ORAL_TABLET | Freq: Three times a day (TID) | ORAL | Status: DC
  Administered 2018-06-20 – 2018-06-21 (×2): 1600 mg via ORAL
  Filled 2018-06-20 (×2): qty 2

## 2018-06-20 MED ORDER — IPRATROPIUM-ALBUTEROL 0.5-2.5 (3) MG/3ML IN SOLN
3.0000 mL | Freq: Four times a day (QID) | RESPIRATORY_TRACT | Status: DC
  Administered 2018-06-20 – 2018-06-21 (×3): 3 mL via RESPIRATORY_TRACT
  Filled 2018-06-20 (×4): qty 3

## 2018-06-20 MED ORDER — ISOSORBIDE MONONITRATE ER 30 MG PO TB24
30.0000 mg | ORAL_TABLET | Freq: Every day | ORAL | Status: DC
  Administered 2018-06-20: 30 mg via ORAL
  Filled 2018-06-20: qty 1

## 2018-06-20 NOTE — Progress Notes (Signed)
Post HD assessment. Pt tolerated tx well without c/o or complication. Net UF 2015, goal met.    06/20/18 1537  Vital Signs  Temp 98 F (36.7 C)  Temp Source Oral  Pulse Rate 88  Pulse Rate Source Monitor  Resp (!) 24  BP (!) 141/75  BP Location Right Arm  BP Method Automatic  Patient Position (if appropriate) Lying  Oxygen Therapy  SpO2 100 %  O2 Device Nasal Cannula  O2 Flow Rate (L/min) 2 L/min  Dialysis Weight  Weight 93.7 kg  Type of Weight Post-Dialysis  Post-Hemodialysis Assessment  Rinseback Volume (mL) 250 mL  KECN 79.9 V  Dialyzer Clearance Lightly streaked  Duration of HD Treatment -hour(s) 3.5 hour(s)  Hemodialysis Intake (mL) 500 mL  UF Total -Machine (mL) 2515 mL  Net UF (mL) 2015 mL  Tolerated HD Treatment Yes  AVG/AVF Arterial Site Held (minutes) 10 minutes  AVG/AVF Venous Site Held (minutes) 10 minutes  Education / Care Plan  Dialysis Education Provided Yes  Documented Education in Care Plan Yes  Fistula / Graft Left Upper arm Arteriovenous fistula  No Placement Date or Time found.   Placed prior to admission: Yes  Orientation: Left  Access Location: Upper arm  Access Type: Arteriovenous fistula  Site Condition No complications  Fistula / Graft Assessment Present;Thrill;Bruit  Status Deaccessed  Drainage Description None

## 2018-06-20 NOTE — Progress Notes (Addendum)
[facility] [place] NAME: Kimberly Shaffer    MR#:  [id number]  DATE OF BIRTH:  06-20-1951  SUBJECTIVE: Came in for shortness of breath secondary to pulmonary edema, COPD exacerbation.  This is the ninth admission in the last 6 months  CHIEF COMPLAINT:   Chief Complaint  Patient presents with  . Respiratory Distress    REVIEW OF SYSTEMS:   ROS CONSTITUTIONAL: No fever, fatigue or weakness.  EYES: No blurred or double vision.  EARS, NOSE, AND THROAT: No tinnitus or ear pain.  RESPIRATORY: Decreased shortness of breath cARDIOVASCULAR: No chest pain, orthopnea, edema.  GASTROINTESTINAL: No nausea, vomiting, diarrhea or abdominal pain.  GENITOURINARY: No dysuria, hematuria.  ENDOCRINE: No polyuria, nocturia,  HEMATOLOGY: No anemia, easy bruising or bleeding SKIN: No rash or lesion. MUSCULOSKELETAL: No joint pain or arthritis.   NEUROLOGIC: No tingling, numbness, weakness.  PSYCHIATRY: No anxiety or depression.   DRUG ALLERGIES:   Allergies  Allergen Reactions  . Nystatin Hives and Itching  . Sulfa Antibiotics Swelling, Hives and Rash  . Niaspan [Niacin] Other (See Comments) and Hives    VITALS:  Blood pressure (!) 142/79, pulse 85, temperature 98.2 F (36.8 C), temperature source Oral, resp. rate (!) 22, height 5\' 6"  (1.676 m), weight 110.9 kg, SpO2 99 %.  PHYSICAL EXAMINATION:  GENERAL:  67 y.o.-year-old patient lying in the bed with no acute distress.  EYES: Pupils equal, round, reactive to light and accommodation. No scleral icterus. Extraocular muscles intact.  HEENT: Head atraumatic, normocephalic. Oropharynx and nasopharynx clear.  NECK:  Supple, no jugular venous distention. No thyroid enlargement, no tenderness.  LUNGS: diminishedBreath sounds  bilaterally.  CARDIOVASCULAR: S1, S2 normal. No murmurs, rubs, or gallops.  ABDOMEN: Soft, nontender, nondistended. Bowel sounds present. No organomegaly or mass.   EXTREMITIES: No pedal edema, cyanosis, or clubbing.  NEUROLOGIC: Cranial nerves II through XII are intact. Muscle strength 5/5 in all extremities. Sensation intact. Gait not checked.  PSYCHIATRIC: The patient is alert and oriented x 3.  SKIN: No obvious rash, lesion, or ulcer.    LABORATORY PANEL:   CBC Recent Labs  Lab 06/20/18 0619  WBC 9.2  HGB 8.4*  HCT 25.0*  PLT 194   ------------------------------------------------------------------------------------------------------------------  Chemistries  Recent Labs  Lab 06/19/18 0522 06/20/18 0619  NA 135 138  K 4.6 3.7  CL 103 97*  CO2 20* 28  GLUCOSE 208* 111*  BUN 46* 24*  CREATININE 7.82* 4.85*  CALCIUM 8.6* 8.8*  AST 22  --   ALT 11  --   ALKPHOS 65  --   BILITOT 0.9  --    ------------------------------------------------------------------------------------------------------------------  Cardiac Enzymes Recent Labs  Lab 06/19/18 1144  TROPONINI 0.05*   ------------------------------------------------------------------------------------------------------------------  RADIOLOGY:  Dg Chest Portable 1 View  Result Date: 06/19/2018 CLINICAL DATA:  Respiratory distress.  Hypoxia.  History of COPD. EXAM: PORTABLE CHEST 1 VIEW COMPARISON:  Chest radiograph May 29, 2018 FINDINGS: Cardiac silhouette remains moderately enlarged. Coronary artery calcification or stent. Calcified aortic arch. Similar fullness of the hila most compatible with vascular shadows. Diffuse interstitial prominence confluent in lung bases with small pleural effusions. Chronic LEFT lung herniation. No pneumothorax. Osseous structures are non suspicious. IMPRESSION: 1. Stable cardiomegaly with interstitial edema, confluent in lung bases. Small pleural effusions. 2. Fullness of the hila most compatible with vascular shadows. Electronically Signed   By: Elon Alas M.D.   On: 06/19/2018 05:46    EKG:   Orders placed  or performed during  the hospital encounter of 06/19/18  . EKG 12-Lead  . EKG 12-Lead  . ED EKG  . ED EKG  . EKG 12-Lead  . EKG 12-Lead    ASSESSMENT AND PLAN:  67 year old female with ESRD on hemodialysis, CAD, CABG, hypertension, hyperlipidemia diabetes mellitus type 2 comes in because of shortness of breath.  Admitted for CHF and COPD exacerbation. #1 acute respiratory failure due to COPD and CHF exacerbation; improving; continue bronchodilators, steroids 2.  Shortness of breath likely fluid overload in the context of ESRD, patient receiving hemodialysis today also patient restarted ultrafiltration yesterday, wean off oxygen as tolerated.  Hemodynamically stable otherwise.  #3 ESRD on hemodialysis, patient had extra hemodialysis yesterday For GERD: Continue PPI CAD: Continue aspirin, Plavix, and by cardiology, patient had a EF of 45 to 50% in July 2019, patient has history of combined systolic and diastolic heart failure, had acute on chronic combined heart failure secondary to fluid overload in the context of ESRD on admission.  BNP elevated to 2900 01.  Patient had elevated troponins up to 0.05.  Seen by cardiology. Cardiology not planning on any ischemia work-up.     All the records are reviewed and case discussed with Care Management/Social Workerr. Management plans discussed with the patient, family and they are in agreement.  CODE STATUS:DNR  TOTAL TIME TAKING CARE OF THIS PATIENT: 32minutes.   POSSIBLE D/C IN 1-2 DAYS, DEPENDING ON CLINICAL CONDITION.   Epifanio Lesches M.D on 06/20/2018 at 1:51 PM  Between 7am to 6pm - Pager - [phone]  After 6pm go to [email or web address] - password [email or web address] [facility]  Office  [phone]  CC: Primary care physician; Clinic, [facility]   Note: This dictation was prepared with Dragon dictation along with smaller phrase technology. Any transcriptional errors that result from this process are unintentional.

## 2018-06-20 NOTE — Progress Notes (Signed)
Post HD assessment    06/20/18 1536  Neurological  Level of Consciousness Alert  Orientation Level Oriented X4  Respiratory  Respiratory Pattern Regular  Chest Assessment Chest expansion symmetrical  Cough Non-productive  Cardiac  ECG Monitor Yes  Cardiac Rhythm NSR  Vascular  R Radial Pulse +2  L Radial Pulse +2  Edema Generalized  Integumentary  Integumentary (WDL) X  Skin Color Appropriate for ethnicity  Musculoskeletal  Musculoskeletal (WDL) X  Generalized Weakness Yes  Assistive Device None  GU Assessment  Genitourinary (WDL) X  Genitourinary Symptoms  (HD)  Psychosocial  Psychosocial (WDL) WDL

## 2018-06-20 NOTE — Progress Notes (Signed)
Patient refusing bed alarm, stated she would call if she needs to get up. Patient alert and oriented, will continue to monitor.

## 2018-06-20 NOTE — Progress Notes (Signed)
HD tx start    06/20/18 1153  Vital Signs  Pulse Rate 79  Pulse Rate Source Monitor  Resp 17  BP 124/70  BP Location Right Arm  BP Method Automatic  Patient Position (if appropriate) Lying  Oxygen Therapy  SpO2 99 %  O2 Device Nasal Cannula  O2 Flow Rate (L/min) 2 L/min  During Hemodialysis Assessment  Blood Flow Rate (mL/min) 400 mL/min  Arterial Pressure (mmHg) -140 mmHg  Venous Pressure (mmHg) 200 mmHg  Transmembrane Pressure (mmHg) 70 mmHg  Ultrafiltration Rate (mL/min) 710 mL/min  Dialysate Flow Rate (mL/min) 600 ml/min  Conductivity: Machine  14  HD Safety Checks Performed Yes  Dialysis Fluid Bolus Normal Saline  Bolus Amount (mL) 250 mL  Intra-Hemodialysis Comments Tx initiated  Fistula / Graft Left Upper arm Arteriovenous fistula  No Placement Date or Time found.   Placed prior to admission: Yes  Orientation: Left  Access Location: Upper arm  Access Type: Arteriovenous fistula  Status Accessed  Needle Size 15

## 2018-06-20 NOTE — Care Management (Signed)
RN to assess and patient is off unit for HD.

## 2018-06-20 NOTE — Progress Notes (Signed)
Pre HD assessment    06/20/18 1146  Neurological  Level of Consciousness Alert  Orientation Level Oriented X4  Respiratory  Respiratory Pattern Regular  Chest Assessment Chest expansion symmetrical  Cardiac  ECG Monitor Yes  Cardiac Rhythm NSR  Vascular  R Radial Pulse +2  L Radial Pulse +2  Edema Generalized  Integumentary  Integumentary (WDL) X  Skin Color Appropriate for ethnicity  Musculoskeletal  Musculoskeletal (WDL) X  Generalized Weakness Yes  Assistive Device None  GU Assessment  Genitourinary (WDL) X  Genitourinary Symptoms  (HD)  Psychosocial  Psychosocial (WDL) WDL

## 2018-06-20 NOTE — Progress Notes (Signed)
Pre HD assessment    06/20/18 1145  Vital Signs  Temp 98.2 F (36.8 C)  Temp Source Oral  Pulse Rate 79  Pulse Rate Source Monitor  Resp (!) 22  BP 116/72  BP Location Right Arm  BP Method Automatic  Patient Position (if appropriate) Lying  Oxygen Therapy  SpO2 100 %  O2 Device Nasal Cannula  O2 Flow Rate (L/min) 2 L/min  Pain Assessment  Pain Scale 0-10  Pain Score 0  Dialysis Weight  Weight 110.9 kg  Type of Weight Pre-Dialysis  Time-Out for Hemodialysis  What Procedure? HD  Pt Identifiers(min of two) First/Last Name;MRN/Account#  Correct Site? Yes  Correct Side? Yes  Correct Procedure? Yes  Consents Verified? Yes  Rad Studies Available? N/A  Safety Precautions Reviewed? Yes  Engineer, civil (consulting) Number  (7A)  Station Number 4  UF/Alarm Test Passed  Conductivity: Meter 14  Conductivity: Machine  14.1  pH 7.4  Reverse Osmosis main  Normal Saline Lot Number [id number]  Dialyzer Lot Number [id number]  Disposable Set Lot Number [id number]  Machine Temperature 98.6 F (37 C)  Musician and Audible Yes  Blood Lines Intact and Secured Yes  Pre Treatment Patient Checks  Vascular access used during treatment Fistula  Hepatitis B Surface Antigen Results Negative  Date Hepatitis B Surface Antigen Drawn 01/11/18  Hepatitis B Surface Antibody  (>10)  Date Hepatitis B Surface Antibody Drawn 01/11/18  Hemodialysis Consent Verified Yes  Hemodialysis Standing Orders Initiated Yes  ECG (Telemetry) Monitor On Yes  Prime Ordered Normal Saline  Length of  DialysisTreatment -hour(s) 3.5 Hour(s)  Dialyzer Elisio 17H NR  Dialysate 2K, 2.5 Ca  Dialysis Anticoagulant None  Dialysate Flow Ordered 600  Blood Flow Rate Ordered 400 mL/min  Ultrafiltration Goal 2 Liters  Pre Treatment Labs Phosphorus  Dialysis Blood Pressure Support Ordered Normal Saline  Education / Care Plan  Dialysis Education Provided Yes  Documented Education in Care Plan Yes  Fistula / Graft Left  Upper arm Arteriovenous fistula  No Placement Date or Time found.   Placed prior to admission: Yes  Orientation: Left  Access Location: Upper arm  Access Type: Arteriovenous fistula  Site Condition No complications  Fistula / Graft Assessment Present;Thrill;Bruit  Drainage Description None

## 2018-06-20 NOTE — Progress Notes (Signed)
[facility]  ROUNDING NOTE   Subjective:  Patient feeling better today. Still on considerable oxygen however. Ultrafiltration achieved yesterday was 2.5 kg.   Objective:  Vital signs in last 24 hours:  Temp:  [97.5 F (36.4 C)-98.5 F (36.9 C)] 97.7 F (36.5 C) (10/02 0803) Pulse Rate:  [77-98] 82 (10/02 0803) Resp:  [15-25] 20 (10/02 0803) BP: (124-173)/(65-91) 140/77 (10/02 0803) SpO2:  [97 %-100 %] 98 % (10/02 0803) FiO2 (%):  [32 %] 32 % (10/02 0119) Weight:  [101.2 kg-102.1 kg] 101.2 kg (10/02 0338)  Weight change: 0.907 kg Filed Weights   06/19/18 1638 06/19/18 2034 06/20/18 0338  Weight: 102.1 kg 101.2 kg 101.2 kg    Intake/Output: I/O last 3 completed shifts: In: 280 [I.V.:30; IV Piggyback:250] Out: 2798 [Urine:300; Other:2498]   Intake/Output this shift:  No intake/output data recorded.  Physical Exam: General: No acute distress  Head: Normocephalic, atraumatic.   Eyes: Anicteric  Neck: Supple, trachea midline  Lungs:  Bilateral rales, normal effort  Heart: S1S2 no rubs  Abdomen:  Soft, nontender, bowel sounds present  Extremities: trace peripheral edema.  Neurologic: Awake, alert, following commands  Skin: No lesions  Access:  LUE AVF    Basic Metabolic Panel: Recent Labs  Lab 06/19/18 0522 06/19/18 1706 06/20/18 0619  NA 135  --  138  K 4.6  --  3.7  CL 103  --  97*  CO2 20*  --  28  GLUCOSE 208*  --  111*  BUN 46*  --  24*  CREATININE 7.82*  --  4.85*  CALCIUM 8.6*  --  8.8*  PHOS  --  4.6  --     Liver Function Tests: Recent Labs  Lab 06/19/18 0522  AST 22  ALT 11  ALKPHOS 65  BILITOT 0.9  PROT 8.3*  ALBUMIN 3.8   No results for input(s): LIPASE, AMYLASE in the last 168 hours. No results for input(s): AMMONIA in the last 168 hours.  CBC: Recent Labs  Lab 06/19/18 0522 06/20/18 0619  WBC 10.4 9.2  NEUTROABS 6.9*  --   HGB 8.9* 8.4*  HCT 27.2* 25.0*  MCV 96.4 93.8  PLT 185 194    Cardiac  Enzymes: Recent Labs  Lab 06/19/18 0522 06/19/18 0824 06/19/18 1144  TROPONINI 0.04* 0.05* 0.05*    BNP: Invalid input(s): POCBNP  CBG: Recent Labs  Lab 06/19/18 1240 06/19/18 2144 06/20/18 0805  GLUCAP 227* 120* 111*    Microbiology: Results for orders placed or performed during the hospital encounter of 05/29/18  MRSA PCR Screening     Status: None   Collection Time: 05/29/18  6:51 AM  Result Value Ref Range Status   MRSA by PCR NEGATIVE NEGATIVE Final    Comment:        The GeneXpert MRSA Assay (FDA approved for NASAL specimens only), is one component of a comprehensive MRSA colonization surveillance program. It is not intended to diagnose MRSA infection nor to guide or monitor treatment for MRSA infections. Performed at [facility], [address] [place], [place] [address]     Coagulation Studies: No results for input(s): LABPROT, INR in the last 72 hours.  Urinalysis: Recent Labs    06/19/18 0824  COLORURINE YELLOW*  LABSPEC 1.011  PHURINE 7.0  GLUCOSEU 150*  HGBUR NEGATIVE  BILIRUBINUR NEGATIVE  KETONESUR NEGATIVE  PROTEINUR 100*  NITRITE NEGATIVE  LEUKOCYTESUR NEGATIVE      Imaging: Dg Chest Portable 1 View  Result Date:  06/19/2018 CLINICAL DATA:  Respiratory distress.  Hypoxia.  History of COPD. EXAM: PORTABLE CHEST 1 VIEW COMPARISON:  Chest radiograph May 29, 2018 FINDINGS: Cardiac silhouette remains moderately enlarged. Coronary artery calcification or stent. Calcified aortic arch. Similar fullness of the hila most compatible with vascular shadows. Diffuse interstitial prominence confluent in lung bases with small pleural effusions. Chronic LEFT lung herniation. No pneumothorax. Osseous structures are non suspicious. IMPRESSION: 1. Stable cardiomegaly with interstitial edema, confluent in lung bases. Small pleural effusions. 2. Fullness of the hila most compatible with vascular shadows. Electronically Signed   By: Elon Alas M.D.   On: 06/19/2018 05:46     Medications:   . sodium chloride Stopped (06/19/18 0951)   . aspirin  81 mg Oral Daily  . atorvastatin  40 mg Oral QHS  . azithromycin  500 mg Oral Daily  . cinacalcet  30 mg Oral QAC breakfast  . clopidogrel  75 mg Oral Daily  . epoetin (EPOGEN/PROCRIT) injection  4,000 Units Subcutaneous Q T,Th,Sa-HD  . feeding supplement (NEPRO CARB STEADY)  237 mL Oral Q24H  . fluticasone furoate-vilanterol  1 puff Inhalation Daily  . folic acid  1 mg Oral Daily  . heparin  5,000 Units Subcutaneous Q8H  . Influenza vac split quadrivalent PF  0.5 mL Intramuscular Tomorrow-1000  . insulin aspart  0-15 Units Subcutaneous TID WC  . insulin aspart  0-5 Units Subcutaneous QHS  . ipratropium-albuterol  3 mL Nebulization Q4H  . lubiprostone  24 mcg Oral BID WC  . mouth rinse  15 mL Mouth Rinse BID  . methylPREDNISolone (SOLU-MEDROL) injection  60 mg Intravenous Q12H   Followed by  . [START ON 06/21/2018] predniSONE  40 mg Oral Q breakfast  . metoprolol succinate  100 mg Oral Q breakfast  . multivitamin  1 tablet Oral QHS  . nicotine  21 mg Transdermal Daily  . nystatin  5 mL Mouth/Throat QID  . pantoprazole  40 mg Oral BID  . polyethylene glycol  17 g Oral Daily  . ranolazine  500 mg Oral BID  . sevelamer carbonate  2.4 g Oral BID WC  . sodium chloride flush  3 mL Intravenous Q12H  . tiotropium  1 capsule Inhalation Daily  . venlafaxine XR  37.5 mg Oral Q breakfast   sodium chloride, acetaminophen, bisacodyl, meclizine, nitroGLYCERIN, ondansetron (ZOFRAN) IV, oxyCODONE, senna-docusate, sodium chloride flush  Assessment/ Plan:  67 y.o. female with end stage renal disease on hemodialysis, coronary artery disease status post CABG, hypertension, peripheral vascular disease, diabetes mellitus type 2, hyperlipidemia, bilateral total knee replacement, PD catheter removal June 30, 2017, s/p PCI mLAD 03/2018  [facility]/[address]TTS/left AVF/100.5 kg  1.   ESRD on HD TTS. 2.  Anemia of chronic kidney disease. 3.  Secondary hyperparathyroidism. 4.  Acute pulmonary edema.  Plan: Patient still having some shortness of breath and has an oxygen requirement.  As such we will plan for another dialysis session today and we will plan for at least 1.5 kg of ultrafiltration.  If the patient is still here tomorrow we will plan for additional dialysis tomorrow as well.  Hemoglobin currently 8.4.  Maintain the patient on Epogen 4000 units IV on Tuesday, Thursday, Saturday.  Maintain the patient on Renvela otherwise.    LOS: 1 Reilyn Nelson 10/2/201910:49 AM

## 2018-06-20 NOTE — Progress Notes (Signed)
HD tx end    06/20/18 1528  Vital Signs  Pulse Rate 89  Pulse Rate Source Monitor  Resp 20  BP 134/88  BP Location Right Arm  BP Method Automatic  Patient Position (if appropriate) Lying  Oxygen Therapy  SpO2 98 %  O2 Device Nasal Cannula  O2 Flow Rate (L/min) 2 L/min  During Hemodialysis Assessment  Dialysis Fluid Bolus Normal Saline  Bolus Amount (mL) 250 mL  Intra-Hemodialysis Comments Tx completed

## 2018-06-21 LAB — GLUCOSE, CAPILLARY: Glucose-Capillary: 133 mg/dL — ABNORMAL HIGH (ref 70–99)

## 2018-06-21 LAB — PHOSPHORUS: Phosphorus: 3 mg/dL (ref 2.5–4.6)

## 2018-06-21 MED ORDER — AZITHROMYCIN 250 MG PO TABS: ORAL_TABLET | ORAL | 0 refills | Status: DC

## 2018-06-21 MED ORDER — TORSEMIDE 20 MG PO TABS
100.0000 mg | ORAL_TABLET | Freq: Every day | ORAL | Status: DC
  Administered 2018-06-21: 100 mg via ORAL
  Filled 2018-06-21: qty 5

## 2018-06-21 NOTE — Progress Notes (Signed)
Pre HD assessment    06/21/18 1119  Vital Signs  Temp 98.3 F (36.8 C)  Temp Source Oral  Pulse Rate 78  Pulse Rate Source Monitor  Resp 12  BP 122/65  BP Location Right Arm  BP Method Automatic  Patient Position (if appropriate) Lying  Oxygen Therapy  SpO2 98 %  O2 Device Nasal Cannula  O2 Flow Rate (L/min) 2 L/min  Pain Assessment  Pain Scale 0-10  Pain Score 0  Dialysis Weight  Weight 107.8 kg  Type of Weight Pre-Dialysis  Time-Out for Hemodialysis  What Procedure? HD  Pt Identifiers(min of two) First/Last Name;MRN/Account#  Correct Site? Yes  Correct Side? Yes  Correct Procedure? Yes  Consents Verified? Yes  Rad Studies Available? N/A  Safety Precautions Reviewed? Yes  Engineer, civil (consulting) Number  (1A)  Station Number 4  UF/Alarm Test Passed  Conductivity: Meter 13.8  Conductivity: Machine  13.7  pH 7.6  Reverse Osmosis main  Normal Saline Lot Number [id number]  Dialyzer Lot Number [id number]  Disposable Set Lot Number [id number]  Machine Temperature 98.6 F (37 C)  Musician and Audible Yes  Blood Lines Intact and Secured Yes  Pre Treatment Patient Checks  Vascular access used during treatment Fistula  Hepatitis B Surface Antigen Results Negative  Date Hepatitis B Surface Antigen Drawn 01/11/18  Hepatitis B Surface Antibody  (>10)  Date Hepatitis B Surface Antibody Drawn 01/11/18  Hemodialysis Consent Verified Yes  Hemodialysis Standing Orders Initiated Yes  ECG (Telemetry) Monitor On Yes  Prime Ordered Normal Saline  Length of  DialysisTreatment -hour(s) 3.5 Hour(s)  Dialyzer Elisio 17H NR  Dialysate 2K, 2.5 Ca  Dialysis Anticoagulant None  Dialysate Flow Ordered 600  Blood Flow Rate Ordered 400 mL/min  Ultrafiltration Goal 2 Liters  Pre Treatment Labs Phosphorus  Dialysis Blood Pressure Support Ordered Normal Saline  Education / Care Plan  Dialysis Education Provided Yes  Documented Education in Care Plan Yes  Fistula / Graft Left Upper  arm Arteriovenous fistula  No Placement Date or Time found.   Placed prior to admission: Yes  Orientation: Left  Access Location: Upper arm  Access Type: Arteriovenous fistula  Site Condition No complications  Fistula / Graft Assessment Present;Thrill;Bruit  Drainage Description None

## 2018-06-21 NOTE — Progress Notes (Signed)
Patient Name: Kimberly Shaffer Date of Encounter: 06/21/2018  Hospital Problem List     Active Problems:   Acute on chronic respiratory failure with hypoxemia [facility])   Acute respiratory failure [facility])    Patient Profile     67 year old female with history of end-stage renal disease on hemodialysis, history of coronary artery disease status post coronary bypass grafting as well as PCI of the LAD most recently in 4431 with both systolic and diastolic heart failure admitted with fluid overload.  She admits to noncompliance with her torsemide.  She has improved since dialysis and starting back on torsemide.  Subjective   Short of breath.  Feels back to her baseline.  Inpatient Medications    . amLODipine  10 mg Oral Daily  . aspirin  81 mg Oral Daily  . atorvastatin  40 mg Oral QHS  . azithromycin  500 mg Oral Daily  . cinacalcet  30 mg Oral QAC breakfast  . clopidogrel  75 mg Oral Daily  . epoetin (EPOGEN/PROCRIT) injection  4,000 Units Subcutaneous Q T,Th,Sa-HD  . feeding supplement (NEPRO CARB STEADY)  237 mL Oral Q24H  . fluticasone furoate-vilanterol  1 puff Inhalation Daily  . folic acid  1 mg Oral Daily  . heparin  5,000 Units Subcutaneous Q8H  . insulin aspart  0-15 Units Subcutaneous TID WC  . insulin aspart  0-5 Units Subcutaneous QHS  . ipratropium-albuterol  3 mL Nebulization Q6H  . isosorbide mononitrate  30 mg Oral Daily  . lubiprostone  24 mcg Oral BID WC  . mouth rinse  15 mL Mouth Rinse BID  . methylPREDNISolone (SOLU-MEDROL) injection  60 mg Intravenous Q12H   Followed by  . predniSONE  40 mg Oral Q breakfast  . metoprolol succinate  100 mg Oral Q breakfast  . multivitamin  1 tablet Oral QHS  . nicotine  21 mg Transdermal Daily  . nystatin  5 mL Mouth/Throat QID  . pantoprazole  40 mg Oral BID  . polyethylene glycol  17 g Oral Daily  . ranolazine  500 mg Oral BID  . sevelamer carbonate  1,600 mg Oral TID WC  . sodium chloride flush  3 mL Intravenous Q12H   . tiotropium  1 capsule Inhalation Daily  . torsemide  100 mg Oral Daily  . venlafaxine XR  37.5 mg Oral Q breakfast    Vital Signs    Vitals:   06/21/18 0208 06/21/18 0523 06/21/18 0712 06/21/18 0807  BP:  133/83  129/74  Pulse:  78  79  Resp:  18    Temp:  98.2 F (36.8 C)    TempSrc:  Oral    SpO2: 98% 99% 93% 100%  Weight:  97.9 kg    Height:        Intake/Output Summary (Last 24 hours) at 06/21/2018 0847 Last data filed at 06/21/2018 0523 Gross per 24 hour  Intake -  Output 2015 ml  Net -2015 ml   Filed Weights   06/20/18 1145 06/20/18 1537 06/21/18 0523  Weight: 101.9 kg 93.7 kg 97.9 kg    Physical Exam    GEN: Well nourished, well developed, in no acute distress.  HEENT: normal.  Neck: Supple, no JVD, carotid bruits, or masses. Cardiac: RRR, no murmurs, rubs, or gallops. No clubbing, cyanosis, edema.  Radials/DP/PT 2+ and equal bilaterally.  Respiratory:  Respirations regular and unlabored, clear to auscultation bilaterally. GI: Soft, nontender, nondistended, BS + x 4. MS: no deformity or atrophy. Skin:  warm and dry, no rash. Neuro:  Strength and sensation are intact. Psych: Normal affect.  Labs    CBC Recent Labs    06/19/18 0522 06/20/18 0619  WBC 10.4 9.2  NEUTROABS 6.9*  --   HGB 8.9* 8.4*  HCT 27.2* 25.0*  MCV 96.4 93.8  PLT 185 426   Basic Metabolic Panel Recent Labs    06/19/18 0522 06/19/18 1706 06/20/18 0619 06/20/18 1229  NA 135  --  138  --   K 4.6  --  3.7  --   CL 103  --  97*  --   CO2 20*  --  28  --   GLUCOSE 208*  --  111*  --   BUN 46*  --  24*  --   CREATININE 7.82*  --  4.85*  --   CALCIUM 8.6*  --  8.8*  --   PHOS  --  4.6  --  4.4   Liver Function Tests Recent Labs    06/19/18 0522  AST 22  ALT 11  ALKPHOS 65  BILITOT 0.9  PROT 8.3*  ALBUMIN 3.8   No results for input(s): LIPASE, AMYLASE in the last 72 hours. Cardiac Enzymes Recent Labs    06/19/18 0522 06/19/18 0824 06/19/18 1144  TROPONINI  0.04* 0.05* 0.05*   BNP Recent Labs    06/19/18 0522  BNP 2,901.0*   D-Dimer No results for input(s): DDIMER in the last 72 hours. Hemoglobin A1C No results for input(s): HGBA1C in the last 72 hours. Fasting Lipid Panel No results for input(s): CHOL, HDL, LDLCALC, TRIG, CHOLHDL, LDLDIRECT in the last 72 hours. Thyroid Function Tests No results for input(s): TSH, T4TOTAL, T3FREE, THYROIDAB in the last 72 hours.  Invalid input(s): FREET3  Telemetry    Normal sinus rhythm  ECG    Sinus rhythm with no ischemia  Radiology    Dg Chest Portable 1 View  Result Date: 06/19/2018 CLINICAL DATA:  Respiratory distress.  Hypoxia.  History of COPD. EXAM: PORTABLE CHEST 1 VIEW COMPARISON:  Chest radiograph May 29, 2018 FINDINGS: Cardiac silhouette remains moderately enlarged. Coronary artery calcification or stent. Calcified aortic arch. Similar fullness of the hila most compatible with vascular shadows. Diffuse interstitial prominence confluent in lung bases with small pleural effusions. Chronic LEFT lung herniation. No pneumothorax. Osseous structures are non suspicious. IMPRESSION: 1. Stable cardiomegaly with interstitial edema, confluent in lung bases. Small pleural effusions. 2. Fullness of the hila most compatible with vascular shadows. Electronically Signed   By: Elon Alas M.D.   On: 06/19/2018 05:46   Dg Chest Port 1 View  Result Date: 05/29/2018 CLINICAL DATA:  Respiratory distress.  Missed dialysis 2 days ago. EXAM: PORTABLE CHEST 1 VIEW COMPARISON:  04/18/2018, chest CT 12/17/2015 FINDINGS: Unchanged cardiomegaly and mediastinal contours. Moderate pulmonary edema, increased from prior exam. No confluent airspace disease. No large pleural effusion or pneumothorax. 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 IMPRESSION: Moderate pulmonary edema with unchanged cardiomegaly. Electronically Signed   By: Keith Rake M.D.   On: 05/29/2018 03:00    Assessment & Plan    Coronary disease-stable.  Ruled out for myocardial infarction.  We will continue with current regimen including isosorbide mono nitrate 30 mg daily, metoprolol succinate 100 mg daily, statin at 40 mg daily, dual antiplatelet therapy with aspirin and Plavix.  No further ischemic work-up indicated at present  Congestive heart failure-likely multifactorial.  Patient has improved.  She was noncompliance with torsemide.  We discussed the importance of daily weights, low-sodium diet and compliance with diuretic as well as hemodialysis.  Patient appears back to her baseline.  Would consider discharge today if able to ambulate without difficulty.  She will follow-up with Dr. Allie Bossier, [facility] cardiology at [facility] in [place].  Signed, Javier Docker Emmons Toth MD 06/21/2018, 8:47 AM  Pager: [phone]

## 2018-06-21 NOTE — Progress Notes (Signed)
Post HD assessment. Pt tolerated tx well without c/o or complication. Net UF 2022, goal met   06/21/18 1514  Vital Signs  Temp 98.1 F (36.7 C)  Temp Source Oral  Pulse Rate 82  Pulse Rate Source Monitor  Resp 20  BP (!) 154/77  BP Location Right Arm  BP Method Automatic  Patient Position (if appropriate) Lying  Oxygen Therapy  SpO2 100 %  O2 Device Nasal Cannula  O2 Flow Rate (L/min) 2 L/min  Dialysis Weight  Weight 106.4 kg  Type of Weight Post-Dialysis  Post-Hemodialysis Assessment  Rinseback Volume (mL) 250 mL  KECN 81.4 V  Dialyzer Clearance Lightly streaked  Duration of HD Treatment -hour(s) 3.5 hour(s)  Hemodialysis Intake (mL) 500 mL  UF Total -Machine (mL) 2522 mL  Net UF (mL) 2022 mL  Tolerated HD Treatment Yes  AVG/AVF Arterial Site Held (minutes) 10 minutes  AVG/AVF Venous Site Held (minutes) 10 minutes  Education / Care Plan  Dialysis Education Provided Yes  Documented Education in Care Plan Yes  Fistula / Graft Left Upper arm Arteriovenous fistula  No Placement Date or Time found.   Placed prior to admission: Yes  Orientation: Left  Access Location: Upper arm  Access Type: Arteriovenous fistula  Site Condition No complications  Fistula / Graft Assessment Present;Thrill;Bruit  Status Deaccessed  Drainage Description None

## 2018-06-21 NOTE — Progress Notes (Signed)
HD tx end    06/21/18 1506  Vital Signs  Pulse Rate 80  Pulse Rate Source Monitor  Resp (!) 22  BP 140/75  BP Location Right Arm  BP Method Automatic  Patient Position (if appropriate) Lying  Oxygen Therapy  SpO2 100 %  O2 Device Nasal Cannula  O2 Flow Rate (L/min) 2 L/min  During Hemodialysis Assessment  Dialysis Fluid Bolus Normal Saline  Bolus Amount (mL) 250 mL  Intra-Hemodialysis Comments Tx completed

## 2018-06-21 NOTE — Progress Notes (Signed)
Post HD assessment    06/21/18 1513  Neurological  Level of Consciousness Alert  Orientation Level Oriented X4  Respiratory  Respiratory Pattern Regular;Unlabored  Chest Assessment Chest expansion symmetrical  Cough Non-productive  Cardiac  Pulse Regular  ECG Monitor Yes  Vascular  R Radial Pulse +2  L Radial Pulse +2  Edema Generalized  Integumentary  Integumentary (WDL) X  Skin Color Appropriate for ethnicity  Musculoskeletal  Musculoskeletal (WDL) X  Generalized Weakness Yes  Assistive Device None  GU Assessment  Genitourinary (WDL) X  Genitourinary Symptoms  (HD)  Psychosocial  Psychosocial (WDL) WDL

## 2018-06-21 NOTE — Progress Notes (Signed)
HD tx start    06/21/18 1128  Vital Signs  Pulse Rate 80  Pulse Rate Source Monitor  Resp 17  BP 124/69  BP Location Right Arm  BP Method Automatic  Patient Position (if appropriate) Lying  Oxygen Therapy  SpO2 97 %  O2 Device Nasal Cannula  O2 Flow Rate (L/min) 2 L/min  During Hemodialysis Assessment  Blood Flow Rate (mL/min) 400 mL/min  Arterial Pressure (mmHg) -90 mmHg  Venous Pressure (mmHg) 140 mmHg  Transmembrane Pressure (mmHg) 80 mmHg  Ultrafiltration Rate (mL/min) 710 mL/min  Dialysate Flow Rate (mL/min) 600 ml/min  Conductivity: Machine  13.9  HD Safety Checks Performed Yes  Dialysis Fluid Bolus Normal Saline  Bolus Amount (mL) 250 mL  Intra-Hemodialysis Comments Tx initiated  Fistula / Graft Left Upper arm Arteriovenous fistula  No Placement Date or Time found.   Placed prior to admission: Yes  Orientation: Left  Access Location: Upper arm  Access Type: Arteriovenous fistula  Status Accessed  Needle Size 15

## 2018-06-21 NOTE — Progress Notes (Signed)
Pt lost IV access, and refused a new one. Dr. Vianne Bulls informed, and MD stated pt did not have to have IV access, pt will discharge today.

## 2018-06-21 NOTE — Care Management Note (Signed)
Case Management Note  Patient Details  Name: Kimberly Shaffer MRN: [id number] Date of Birth: Apr 05, 1951  Subjective/Objective:       Patient admitted with SOB from home; Acute on chronic CHF.  Recently discharged from [facility] s/p cardiac stent placement.    She is open to [facility] for RN, PT, SW.  Considered adding ReDS vest but patient is on chronic HD and fluid levels may skew results.  Patient given a scale to take home.  Denies difficulty obtaining medications and with transportation.  She is on chronic O2 at home and has her O2 supplies here for discharge.  Current with PCP.    Action/Plan:Notified Helene Kelp with [facility] of discharge later today.               Expected Discharge Date:  06/21/18               Expected Discharge Plan:  [place] [facility] [place]  In-House Referral:     Discharge planning Services  CM Consult  Post Acute Care Choice:  Resumption of Svcs/PTA Provider Choice offered to:     DME Arranged:    DME Agency:     HH Arranged:  RN, PT, Social Work [employer] Agency:  [facility] [employer] [facility] [employer]  Status of Service:  Completed, signed off  If discussed at [employer], dates discussed:    Additional Comments:  Elza Rafter, RN 06/21/2018, 1:14 PM

## 2018-06-21 NOTE — Progress Notes (Signed)
[facility]  ROUNDING NOTE   Subjective:  Patient seen and evaluated during hemodialysis treatment. Overall feeling much better as compared to admission.   Objective:  Vital signs in last 24 hours:  Temp:  [98 F (36.7 C)-98.3 F (36.8 C)] 98.1 F (36.7 C) (10/03 1514) Pulse Rate:  [73-98] 82 (10/03 1518) Resp:  [12-24] 19 (10/03 1518) BP: (109-165)/(52-125) 154/76 (10/03 1515) SpO2:  [93 %-100 %] 99 % (10/03 1518) FiO2 (%):  [32 %] 32 % (10/03 0208) Weight:  [93.7 kg-107.8 kg] 106.4 kg (10/03 1514)  Weight change: 0.748 kg Filed Weights   06/21/18 0523 06/21/18 1119 06/21/18 1514  Weight: 97.9 kg 107.8 kg 106.4 kg    Intake/Output: I/O last 3 completed shifts: In: -  Out: 4513 [Other:4513]   Intake/Output this shift:  Total I/O In: 240 [P.O.:240] Out: 2022 [Other:2022]  Physical Exam: General: No acute distress  Head: Normocephalic, atraumatic.   Eyes: Anicteric  Neck: Supple, trachea midline  Lungs:  CTAB, normal effort  Heart: S1S2 no rubs  Abdomen:  Soft, nontender, bowel sounds present  Extremities: trace peripheral edema.  Neurologic: Awake, alert, following commands  Skin: No lesions  Access:  LUE AVF    Basic Metabolic Panel: Recent Labs  Lab 06/19/18 0522 06/19/18 1706 06/20/18 0619 06/20/18 1229 06/21/18 1158  NA 135  --  138  --   --   K 4.6  --  3.7  --   --   CL 103  --  97*  --   --   CO2 20*  --  28  --   --   GLUCOSE 208*  --  111*  --   --   BUN 46*  --  24*  --   --   CREATININE 7.82*  --  4.85*  --   --   CALCIUM 8.6*  --  8.8*  --   --   PHOS  --  4.6  --  4.4 3.0    Liver Function Tests: Recent Labs  Lab 06/19/18 0522  AST 22  ALT 11  ALKPHOS 65  BILITOT 0.9  PROT 8.3*  ALBUMIN 3.8   No results for input(s): LIPASE, AMYLASE in the last 168 hours. No results for input(s): AMMONIA in the last 168 hours.  CBC: Recent Labs  Lab 06/19/18 0522 06/20/18 0619  WBC 10.4 9.2  NEUTROABS 6.9*  --   HGB  8.9* 8.4*  HCT 27.2* 25.0*  MCV 96.4 93.8  PLT 185 194    Cardiac Enzymes: Recent Labs  Lab 06/19/18 0522 06/19/18 0824 06/19/18 1144  TROPONINI 0.04* 0.05* 0.05*    BNP: Invalid input(s): POCBNP  CBG: Recent Labs  Lab 06/19/18 2144 06/20/18 0805 06/20/18 1636 06/20/18 2100 06/21/18 0801  GLUCAP 120* 111* 116* 136* 133*    Microbiology: Results for orders placed or performed during the hospital encounter of 05/29/18  MRSA PCR Screening     Status: None   Collection Time: 05/29/18  6:51 AM  Result Value Ref Range Status   MRSA by PCR NEGATIVE NEGATIVE Final    Comment:        The GeneXpert MRSA Assay (FDA approved for NASAL specimens only), is one component of a comprehensive MRSA colonization surveillance program. It is not intended to diagnose MRSA infection nor to guide or monitor treatment for MRSA infections. Performed at [facility], [address] [place], [place] [address]     Coagulation Studies: No results for input(s):  LABPROT, INR in the last 72 hours.  Urinalysis: Recent Labs    06/19/18 0824  COLORURINE YELLOW*  LABSPEC 1.011  PHURINE 7.0  GLUCOSEU 150*  HGBUR NEGATIVE  BILIRUBINUR NEGATIVE  KETONESUR NEGATIVE  PROTEINUR 100*  NITRITE NEGATIVE  LEUKOCYTESUR NEGATIVE      Imaging: No results found.   Medications:   . sodium chloride Stopped (06/19/18 0951)   . amLODipine  10 mg Oral Daily  . aspirin  81 mg Oral Daily  . atorvastatin  40 mg Oral QHS  . azithromycin  500 mg Oral Daily  . cinacalcet  30 mg Oral QAC breakfast  . clopidogrel  75 mg Oral Daily  . epoetin (EPOGEN/PROCRIT) injection  4,000 Units Subcutaneous Q T,Th,Sa-HD  . feeding supplement (NEPRO CARB STEADY)  237 mL Oral Q24H  . fluticasone furoate-vilanterol  1 puff Inhalation Daily  . folic acid  1 mg Oral Daily  . heparin  5,000 Units Subcutaneous Q8H  . insulin aspart  0-15 Units Subcutaneous TID WC  . insulin aspart  0-5 Units  Subcutaneous QHS  . ipratropium-albuterol  3 mL Nebulization Q6H  . isosorbide mononitrate  30 mg Oral Daily  . lubiprostone  24 mcg Oral BID WC  . mouth rinse  15 mL Mouth Rinse BID  . methylPREDNISolone (SOLU-MEDROL) injection  60 mg Intravenous Q12H   Followed by  . predniSONE  40 mg Oral Q breakfast  . metoprolol succinate  100 mg Oral Q breakfast  . multivitamin  1 tablet Oral QHS  . nicotine  21 mg Transdermal Daily  . nystatin  5 mL Mouth/Throat QID  . pantoprazole  40 mg Oral BID  . polyethylene glycol  17 g Oral Daily  . ranolazine  500 mg Oral BID  . sevelamer carbonate  1,600 mg Oral TID WC  . sodium chloride flush  3 mL Intravenous Q12H  . tiotropium  1 capsule Inhalation Daily  . torsemide  100 mg Oral Daily  . venlafaxine XR  37.5 mg Oral Q breakfast   sodium chloride, acetaminophen, bisacodyl, ipratropium-albuterol, meclizine, nitroGLYCERIN, ondansetron (ZOFRAN) IV, oxyCODONE, senna-docusate, sodium chloride flush  Assessment/ Plan:  67 y.o. female with end stage renal disease on hemodialysis, coronary artery disease status post CABG, hypertension, peripheral vascular disease, diabetes mellitus type 2, hyperlipidemia, bilateral total knee replacement, PD catheter removal June 30, 2017, s/p PCI mLAD 03/2018  [facility]TTS/left AVF/100.5 kg  1.  ESRD on HD TTS. 2.  Anemia of chronic kidney disease. 3.  Secondary hyperparathyroidism. 4.  Acute pulmonary edema.  Plan: Patient seen and evaluated during hemodialysis treatment.  He has tolerated dialysis well.  Counseled patient on salt restriction as well as fluid restriction as an outpatient.  She verbalized understanding of this.  Pulmonary edema much improved with dialysis and fluid removal.    LOS: 2 Kimberly Shaffer 10/3/20193:27 PM

## 2018-06-21 NOTE — Progress Notes (Signed)
Went over discharge instructions with the patient including mediations and follow-up appointment. Discontinue telemetry monitor, patient does not have IV upon assessment. Waiting for daughter to pick up patient, NT to help patient for transport.

## 2018-06-21 NOTE — Progress Notes (Signed)
Pre HD assessment    06/21/18 1120  Neurological  Level of Consciousness Alert  Orientation Level Oriented X4  Respiratory  Respiratory Pattern Regular;Unlabored  Chest Assessment Chest expansion symmetrical  Cough Non-productive  Cardiac  Pulse Regular  ECG Monitor Yes  Cardiac Rhythm NSR  Vascular  R Radial Pulse +2  L Radial Pulse +2  Edema Generalized  Integumentary  Integumentary (WDL) X  Skin Color Appropriate for ethnicity  Musculoskeletal  Musculoskeletal (WDL) X  Generalized Weakness Yes  Assistive Device None  GU Assessment  Genitourinary (WDL) X  Genitourinary Symptoms  (HD)  Psychosocial  Psychosocial (WDL) WDL

## 2018-06-21 NOTE — Progress Notes (Signed)
Discharge home after hemodialysis.  Discharge instructions on the computer.  Patient told me that she is not taking torsemide for 1 month.  Discussed with Dr. Zollie Scale that patient can continue torsemide and told the patient about that same.  Discharge home after hemodialysis, resume previous home health.  Patient can follow-up with her [facility] cardiologist Dr. Jeneen Rinks as scheduled

## 2018-06-22 LAB — GLUCOSE, CAPILLARY: Glucose-Capillary: 94 mg/dL (ref 70–99)

## 2018-06-24 NOTE — Discharge Summary (Signed)
Kimberly Shaffer, is a 67 y.o. female  DOB 1951/04/06  MRN [id number].  Admission date:  06/19/2018  Admitting Physician  Dustin Flock, MD  Discharge Date:  06/21/2018   Primary MD  Clinic, [facility]  Recommendations for primary care physician for things to follow:  Follow-up with PCP in 1 week   Admission Diagnosis  Acute pulmonary edema (HCC) [J81.0] COPD exacerbation (HCC) [J44.1] Acute respiratory failure with hypoxia and hypercapnia (HCC) [J96.01, J96.02] Acute respiratory failure (HCC) [J96.00]   Discharge Diagnosis  Acute pulmonary edema (HCC) [J81.0] COPD exacerbation (HCC) [J44.1] Acute respiratory failure with hypoxia and hypercapnia (HCC) [J96.01, J96.02] Acute respiratory failure (HCC) [J96.00]   Active Problems:   Acute on chronic respiratory failure with hypoxemia (HCC)   Acute respiratory failure (HCC)      Past Medical History:  Diagnosis Date  . Asthma   . CAD (coronary artery disease)   . Chronic lower back pain   . COPD (chronic obstructive pulmonary disease) ([place])   . Depression   . Diabetes mellitus without complication (HCC)    NIDD  . H/O angina pectoris   . H/O blood clots   . Hypercholesteremia   . Hypertension   . Left rotator cuff tear   . Myocardial infarction ([place])   . Obesity   . Renal insufficiency   . Stroke ([place])   . Vertigo, aural     Past Surgical History:  Procedure Laterality Date  . A/V FISTULAGRAM Left 03/14/2017   Procedure: A/V Fistulagram;  Surgeon: Katha Cabal, MD;  Location: [facility];  Service: Cardiovascular;  Laterality: Left;  . AV FISTULA PLACEMENT Left 11-04-2015  . AV FISTULA PLACEMENT Left 11/04/2015   Procedure: ARTERIOVENOUS (AV) FISTULA CREATION  ( BRACHIAL CEPHALIC );  Surgeon: Katha Cabal, MD;  Location: [facility];   Service: Vascular;  Laterality: Left;  . CARDIAC CATHETERIZATION    . CARDIAC CATHETERIZATION Left 07/19/2016   Procedure: Left Heart Cath and Coronary Angiography;  Surgeon: Corey Skains, MD;  Location: [facility];  Service: Cardiovascular;  Laterality: Left;  . CORONARY ARTERY BYPASS GRAFT  2012  . CORONARY STENT PLACEMENT    . JOINT REPLACEMENT    . KNEE SURGERY Bilateral   . LEFT HEART CATH AND CORONARY ANGIOGRAPHY N/A 12/25/2017   Procedure: LEFT HEART CATH AND CORONARY ANGIOGRAPHY;  Surgeon: Teodoro Spray, MD;  Location: [facility];  Service: Cardiovascular;  Laterality: N/A;  . LEFT HEART CATH AND CORONARY ANGIOGRAPHY Right 03/23/2018   Procedure: LEFT HEART CATH AND CORONARY ANGIOGRAPHY;  Surgeon: Dionisio David, MD;  Location: [facility];  Service: Cardiovascular;  Laterality: Right;  . LEFT HEART CATH AND CORONARY ANGIOGRAPHY N/A 05/30/2018   Procedure: LEFT HEART CATH AND CORONARY ANGIOGRAPHY;  Surgeon: Corey Skains, MD;  Location: [facility];  Service: Cardiovascular;  Laterality: N/A;  . PERIPHERAL VASCULAR CATHETERIZATION N/A 11/13/2015   Procedure: Dialysis/Perma Catheter Insertion;  Surgeon: Katha Cabal, MD;  Location: [facility];  Service: Cardiovascular;  Laterality: N/A;  . PERIPHERAL VASCULAR CATHETERIZATION N/A 12/04/2015   Procedure: Dialysis/Perma Catheter Insertion;  Surgeon: Katha Cabal, MD;  Location: [facility];  Service: Cardiovascular;  Laterality: N/A;  . PERIPHERAL VASCULAR CATHETERIZATION Left 12/31/2015   Procedure: A/V Shuntogram/Fistulagram;  Surgeon: Algernon Huxley, MD;  Location: [place];  Service: Cardiovascular;  Laterality: Left;  . PERIPHERAL VASCULAR CATHETERIZATION N/A 12/31/2015  Procedure: A/V Shunt Intervention;  Surgeon: Algernon Huxley, MD;  Location: [place] [facility];  Service: Cardiovascular;  Laterality: N/A;  . PERIPHERAL VASCULAR CATHETERIZATION Left  02/25/2016   Procedure: A/V Shuntogram/Fistulagram;  Surgeon: Algernon Huxley, MD;  Location: [place] [facility];  Service: Cardiovascular;  Laterality: Left;  . PERIPHERAL VASCULAR CATHETERIZATION N/A 03/18/2016   Procedure: Dialysis/Perma Catheter Removal;  Surgeon: Katha Cabal, MD;  Location: [facility];  Service: Cardiovascular;  Laterality: N/A;  . REMOVAL OF A DIALYSIS CATHETER N/A 06/30/2017   Procedure: REMOVAL OF A DIALYSIS CATHETER;  Surgeon: Katha Cabal, MD;  Location: [facility];  Service: Vascular;  Laterality: N/A;  . REPLACEMENT TOTAL KNEE BILATERAL Bilateral        History of present illness and  Hospital Course:     Kindly see H&P for history of present illness and admission details, please review complete Labs, Consult reports and Test reports for all details in brief  HPI  from the history and physical done on the day of admission  67 year old female patient with ESRD on hemodialysis, COPD, CAD, chronic combined systolic and diastolic heart failure comes in because of shortness of breath, admitted for acute respiratory failure with hypoxia due to COPD, CHF in the context of ESRD.  Hospital Course  #1 acute respiratory failure with COPD exacerbation, CHF exacerbation, admitted to telemetry, started on steroids, bronchodilators.  Patient did not require steroids to go home with shortness of breath improved and later on patient thought to have fluid overload rather than COPD exacerbation.  Did not have any wheezing.  Advised to continue her home dose bronchodilators.  She is on oxygen chronically 2.  Shortness of breath likely fluid overload context of ESRD: Seen by Dr. Anthonette Legato, patient had extra hemodialysis session, advised the patient to continue her routine hemodialysis.  Patient was not taking torsemide at home advised that patient that she needs torsemide. 3.  Recurrent admissions to the hospital with shortness of breath.  Discharged home with home  health nurse, aide.  Also provided education about heart failure, fluid restriction. 4.  GERD: Continue PPI 5.  History of CAD, chronic systolic and diastolic heart failure, troponin slightly elevated, up to 0.05.  Seen by Dr. Ubaldo Glassing from cardiology.  Patient on aspirin, Plavix, EKG showed normal sinus rhythm without any ischemia.  Dr. fat did not recommend any Further cardiac work-up and reassess the patient to follow-up with her primary cardiologist at [facility].  Patient has chronic angina, is on aspirin, beta-blockers, statins, Ranexa, Plavix.    Discharge Condition: Stable   Follow UP  Follow-up Information    Clinic, [facility]. Schedule an appointment as soon as possible for a visit in 1 week(s).   Contact information: [place] [address] [phone]             Discharge Instructions  and  Discharge Medications      Allergies as of 06/21/2018      Reactions   Nystatin Hives, Itching   Sulfa Antibiotics Swelling, Hives, Rash   Niaspan [niacin] Other (See Comments), Hives      Medication List    STOP taking these medications   doxycycline 100 MG tablet Commonly known as:  VIBRA-TABS     TAKE these medications   acetaminophen 325 MG tablet Commonly known as:  TYLENOL Take 2 tablets (650 mg total) by mouth every 6 (six) hours as needed for mild pain (or  Fever >/= 101).   albuterol (2.5 MG/3ML) 0.083% nebulizer solution Commonly known as:  PROVENTIL Take 3 mLs (2.5 mg total) by nebulization every 4 (four) hours as needed for wheezing or shortness of breath.   alteplase 2 MG injection Commonly known as:  CATHFLO ACTIVASE 2 mg by Intracatheter route once as needed for open catheter.   amLODipine 10 MG tablet Commonly known as:  NORVASC Take 10 mg by mouth daily.   aspirin 81 MG chewable tablet Chew 81 mg by mouth daily.   atorvastatin 40 MG tablet Commonly known as:  LIPITOR Take 40 mg by mouth at bedtime.    budesonide-formoterol 160-4.5 MCG/ACT inhaler Commonly known as:  SYMBICORT Inhale 2 puffs into the lungs 2 (two) times daily.   calcium carbonate 500 MG chewable tablet Commonly known as:  TUMS - dosed in mg elemental calcium Chew 2 tablets by mouth 3 (three) times daily.   cetirizine 10 MG tablet Commonly known as:  ZYRTEC Take 10 mg by mouth daily.   Chlorhexidine Gluconate Cloth 2 % Pads Apply 6 each topically daily at 6 (six) AM.   cinacalcet 30 MG tablet Commonly known as:  SENSIPAR Take 1 tablet (30 mg total) by mouth daily before breakfast.   clopidogrel 75 MG tablet Commonly known as:  PLAVIX Take 1 tablet (75 mg total) by mouth daily.   docusate sodium 100 MG capsule Commonly known as:  COLACE Take 1 capsule (100 mg total) by mouth 2 (two) times daily.   feeding supplement (NEPRO CARB STEADY) Liqd Take 237 mLs by mouth daily.   fluticasone 50 MCG/ACT nasal spray Commonly known as:  FLONASE Place 2 sprays into both nostrils 2 (two) times daily.   fluticasone furoate-vilanterol 200-25 MCG/INH Aepb Commonly known as:  BREO ELLIPTA Inhale 1 puff into the lungs daily.   folic acid 1 MG tablet Commonly known as:  FOLVITE Take 1 mg by mouth daily.   heparin 100-0.45 UNIT/ML-% infusion Inject 1,100 Units/hr into the vein continuous.   heparin 1000 unit/mL Soln injection 1 mL (1,000 Units total) by Dialysis route as needed (in dialysis).   isosorbide mononitrate 30 MG 24 hr tablet Commonly known as:  IMDUR Take 30 mg by mouth daily.   lidocaine (PF) 1 % Soln injection Commonly known as:  XYLOCAINE Inject 5 mLs into the skin as needed (topical anesthesia for hemodialysis ifGEBAUERS is ineffective.).   lidocaine 5 % Commonly known as:  LIDODERM Place 1 patch onto the skin daily. Remove & Discard patch within 12 hours or as directed by MD   lidocaine-prilocaine cream Commonly known as:  EMLA Apply 1 application topically as needed (topical anesthesia for  hemodialysis if Gebauers and Lidocaine injection are ineffective.).   lubiprostone 24 MCG capsule Commonly known as:  AMITIZA Take 24 mcg by mouth 2 (two) times daily with a meal.   meclizine 25 MG tablet Commonly known as:  ANTIVERT Take 25 mg by mouth 3 (three) times daily as needed for dizziness.   metoprolol succinate 100 MG 24 hr tablet Commonly known as:  TOPROL-XL Take 1 tablet (100 mg total) by mouth daily with breakfast. Take with or immediately following a meal.   multivitamin Tabs tablet Take 1 tablet by mouth at bedtime.   nitroGLYCERIN 0.4 MG SL tablet Commonly known as:  NITROSTAT Place 1 tablet (0.4 mg total) under the tongue every 5 (five) minutes as needed for chest pain.   nystatin 100000 UNIT/ML suspension Commonly known as:  MYCOSTATIN Use  as directed 5 mLs in the mouth or throat 4 (four) times daily.   ondansetron 4 MG tablet Commonly known as:  ZOFRAN Take 1 tablet (4 mg total) by mouth every 6 (six) hours as needed for nausea.   ondansetron 4 MG/2ML Soln injection Commonly known as:  ZOFRAN Inject 2 mLs (4 mg total) into the vein every 6 (six) hours as needed for nausea.   Oxycodone HCl 10 MG Tabs Take 10 mg by mouth every 6 (six) hours as needed (pain).   pantoprazole 40 MG tablet Commonly known as:  PROTONIX Take 40 mg by mouth 2 (two) times daily.   pentafluoroprop-tetrafluoroeth Aero Commonly known as:  GEBAUERS Apply 1 application topically as needed (topical anesthesia for hemodialysis).   polyethylene glycol packet Commonly known as:  MIRALAX / GLYCOLAX Take 17 g by mouth daily.   ranolazine 500 MG 12 hr tablet Commonly known as:  RANEXA Take 500 mg by mouth 2 (two) times daily.   sevelamer carbonate 800 MG tablet Commonly known as:  RENVELA Take 1,600 mg by mouth 3 (three) times daily with meals. What changed:  Another medication with the same name was removed. Continue taking this medication, and follow the directions you see  here.   sodium chloride 0.9 % infusion Inject 100 mLs into the vein as needed (symptomatic hypotension or decrease in SBP < 90 mmHg).   sodium chloride HYPERTONIC 3 % nebulizer solution Take 3 mLs by nebulization as needed for other.   sodium zirconium cyclosilicate 5 g packet Commonly known as:  LOKELMA Take 10 g by mouth 3 (three) times daily.   tiotropium 18 MCG inhalation capsule Commonly known as:  SPIRIVA Place 1 capsule (18 mcg total) into inhaler and inhale daily.   torsemide 100 MG tablet Commonly known as:  DEMADEX Take 100 mg by mouth daily.   varenicline 0.5 MG tablet Commonly known as:  CHANTIX Take 1 tablet (0.5 mg total) by mouth daily.   venlafaxine XR 37.5 MG 24 hr capsule Commonly known as:  EFFEXOR-XR Take 37.5 mg by mouth daily with breakfast.         Diet and Activity recommendation: See Discharge Instructions above   Consults obtained -cardiology,.Nephrology    Major procedures and Radiology Reports - PLEASE review detailed and final reports for all details, in brief -     Dg Chest Portable 1 View  Result Date: 06/19/2018 CLINICAL DATA:  Respiratory distress.  Hypoxia.  History of COPD. EXAM: PORTABLE CHEST 1 VIEW COMPARISON:  Chest radiograph May 29, 2018 FINDINGS: Cardiac silhouette remains moderately enlarged. Coronary artery calcification or stent. Calcified aortic arch. Similar fullness of the hila most compatible with vascular shadows. Diffuse interstitial prominence confluent in lung bases with small pleural effusions. Chronic LEFT lung herniation. No pneumothorax. Osseous structures are non suspicious. IMPRESSION: 1. Stable cardiomegaly with interstitial edema, confluent in lung bases. Small pleural effusions. 2. Fullness of the hila most compatible with vascular shadows. Electronically Signed   By: Elon Alas M.D.   On: 06/19/2018 05:46   Dg Chest Port 1 View  Result Date: 05/29/2018 CLINICAL DATA:  Respiratory distress.   Missed dialysis 2 days ago. EXAM: PORTABLE CHEST 1 VIEW COMPARISON:  04/18/2018, chest CT 12/17/2015 FINDINGS: Unchanged cardiomegaly and mediastinal contours. Moderate pulmonary edema, increased from prior exam. No confluent airspace disease. No large pleural effusion or pneumothorax. 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 IMPRESSION: Moderate pulmonary edema with unchanged  cardiomegaly. Electronically Signed   By: Keith Rake M.D.   On: 05/29/2018 03:00    Micro Results     No results found for this or any previous visit (from the past 240 hour(s)).     Today   Subjective:   Kimberly Shaffer today has no headache,no chest abdominal pain,no new weakness tingling or numbness, feels much better wants to go home today.   Objective:   Blood pressure 126/75, pulse 84, temperature 98.1 F (36.7 C), temperature source Oral, resp. rate 14, height 5\' 6"  (1.676 m), weight 106.4 kg, SpO2 98 %.  No intake or output data in the 24 hours ending 06/24/18 1825  Exam Awake Alert, Oriented x 3, No new F.N deficits, Normal affect [place].AT,PERRAL Supple Neck,No JVD, No cervical lymphadenopathy appriciated.  Symmetrical Chest wall movement, Good air movement bilaterally, CTAB RRR,No Gallops,Rubs or new Murmurs, No Parasternal Heave +ve B.Sounds, Abd Soft, Non tender, No organomegaly appriciated, No rebound -guarding or rigidity. No Cyanosis, Clubbing or edema, No new Rash or bruise  Data Review   CBC w Diff:  Lab Results  Component Value Date   WBC 9.2 06/20/2018   HGB 8.4 (L) 06/20/2018   HGB 9.3 (L) 04/09/2014   HCT 25.0 (L) 06/20/2018   HCT 29.0 (L) 04/09/2014   PLT 194 06/20/2018   PLT 191 04/09/2014   LYMPHOPCT 22 06/19/2018   LYMPHOPCT 18.5 04/09/2014   MONOPCT 10 06/19/2018   MONOPCT 7.9 04/09/2014   EOSPCT 1 06/19/2018   EOSPCT 2.4 04/09/2014   BASOPCT 1 06/19/2018   BASOPCT 0.5 04/09/2014    CMP:  Lab  Results  Component Value Date   NA 138 06/20/2018   NA 140 04/09/2014   K 3.7 06/20/2018   K 4.5 04/09/2014   CL 97 (L) 06/20/2018   CL 113 (H) 04/09/2014   CO2 28 06/20/2018   CO2 20 (L) 04/09/2014   BUN 24 (H) 06/20/2018   BUN 36 (H) 04/09/2014   CREATININE 4.85 (H) 06/20/2018   CREATININE 3.34 (H) 04/09/2014   PROT 8.3 (H) 06/19/2018   PROT 8.9 (H) 07/20/2013   ALBUMIN 3.8 06/19/2018   ALBUMIN 3.5 07/20/2013   BILITOT 0.9 06/19/2018   BILITOT 0.5 07/20/2013   ALKPHOS 65 06/19/2018   ALKPHOS 127 07/20/2013   AST 22 06/19/2018   AST 39 (H) 07/20/2013   ALT 11 06/19/2018   ALT 22 07/20/2013  .   Total Time in preparing paper work, data evaluation and todays exam - 65 minutes  Epifanio Lesches M.D on 06/21/2018 at 6:25 PM    Note: This dictation was prepared with Dragon dictation along with smaller phrase technology. Any transcriptional errors that result from this process are unintentional.

## 2018-06-26 ENCOUNTER — Emergency Department
Admission: EM | Admit: 2018-06-26 | Discharge: 2018-06-26 | Disposition: A | Discharge status: Home / Self Care | Payer: Medicare Other | Attending: Student in an Organized Health Care Education/Training Program | Admitting: Student in an Organized Health Care Education/Training Program

## 2018-06-26 ENCOUNTER — Emergency Department: Payer: Medicare Other

## 2018-06-26 ENCOUNTER — Encounter: Payer: Self-pay | Admitting: Emergency Medicine

## 2018-06-26 ENCOUNTER — Other Ambulatory Visit: Payer: Self-pay

## 2018-06-26 ENCOUNTER — Telehealth: Payer: Self-pay

## 2018-06-26 DIAGNOSIS — Z79899 Other long term (current) drug therapy: Secondary | ICD-10-CM | POA: Diagnosis not present

## 2018-06-26 DIAGNOSIS — Z7902 Long term (current) use of antithrombotics/antiplatelets: Secondary | ICD-10-CM | POA: Insufficient documentation

## 2018-06-26 DIAGNOSIS — K5900 Constipation, unspecified: Secondary | ICD-10-CM | POA: Insufficient documentation

## 2018-06-26 DIAGNOSIS — I2510 Atherosclerotic heart disease of native coronary artery without angina pectoris: Secondary | ICD-10-CM | POA: Diagnosis not present

## 2018-06-26 DIAGNOSIS — E1140 Type 2 diabetes mellitus with diabetic neuropathy, unspecified: Secondary | ICD-10-CM | POA: Insufficient documentation

## 2018-06-26 DIAGNOSIS — R1084 Generalized abdominal pain: Secondary | ICD-10-CM

## 2018-06-26 DIAGNOSIS — I5033 Acute on chronic diastolic (congestive) heart failure: Secondary | ICD-10-CM | POA: Insufficient documentation

## 2018-06-26 DIAGNOSIS — E1122 Type 2 diabetes mellitus with diabetic chronic kidney disease: Secondary | ICD-10-CM | POA: Diagnosis not present

## 2018-06-26 DIAGNOSIS — N186 End stage renal disease: Secondary | ICD-10-CM | POA: Diagnosis not present

## 2018-06-26 DIAGNOSIS — F17210 Nicotine dependence, cigarettes, uncomplicated: Secondary | ICD-10-CM | POA: Insufficient documentation

## 2018-06-26 DIAGNOSIS — Z7982 Long term (current) use of aspirin: Secondary | ICD-10-CM | POA: Insufficient documentation

## 2018-06-26 DIAGNOSIS — J449 Chronic obstructive pulmonary disease, unspecified: Secondary | ICD-10-CM | POA: Diagnosis not present

## 2018-06-26 DIAGNOSIS — I132 Hypertensive heart and chronic kidney disease with heart failure and with stage 5 chronic kidney disease, or end stage renal disease: Secondary | ICD-10-CM | POA: Insufficient documentation

## 2018-06-26 LAB — BASIC METABOLIC PANEL
Anion gap: 11 (ref 5–15)
BUN: 22 mg/dL (ref 8–23)
CO2: 29 mmol/L (ref 22–32)
Calcium: 8.1 mg/dL — ABNORMAL LOW (ref 8.9–10.3)
Chloride: 97 mmol/L — ABNORMAL LOW (ref 98–111)
Creatinine, Ser: 4.52 mg/dL — ABNORMAL HIGH (ref 0.44–1.00)
GFR calc Af Amer: 11 mL/min — ABNORMAL LOW (ref >60)
GFR calc non Af Amer: 9 mL/min — ABNORMAL LOW (ref >60)
Glucose, Bld: 124 mg/dL — ABNORMAL HIGH (ref 70–99)
Potassium: 3.1 mmol/L — ABNORMAL LOW (ref 3.5–5.1)
Sodium: 137 mmol/L (ref 135–145)

## 2018-06-26 MED ORDER — MAGNESIUM CITRATE PO SOLN
1.0000 | Freq: Once | ORAL | Status: AC
  Administered 2018-06-26: 1 via ORAL
  Filled 2018-06-26: qty 296

## 2018-06-26 NOTE — ED Provider Notes (Signed)
[facility] Emergency Department Provider Note    First MD Initiated Contact with Patient 06/26/18 1818     (approximate)  I have reviewed the triage vital signs and the nursing notes.   HISTORY  Chief Complaint Constipation    HPI Kimberly Shaffer is a 67 y.o. female presents the ER for evaluation of constipation.  Patient states that she has not been able to move her bowels since recent admission the hospital.  States that was having crampy abdominal pain.  No nausea or vomiting.  No measured fevers.  Did have dialysis today.  Has never had issues with constipation or symptoms like this before.  On arrival to the ER the patient was able to move her bowels with large amount of flatulence with improvement in symptoms.  Is now pain-free.    Past Medical History:  Diagnosis Date  . Asthma   . CAD (coronary artery disease)   . Chronic lower back pain   . COPD (chronic obstructive pulmonary disease) ([place])   . Depression   . Diabetes mellitus without complication (HCC)    NIDD  . H/O angina pectoris   . H/O blood clots   . Hypercholesteremia   . Hypertension   . Left rotator cuff tear   . Myocardial infarction ([place])   . Obesity   . Renal insufficiency   . Stroke ([place])   . Vertigo, aural    Family History  Problem Relation Age of Onset  . Cancer Mother   . Cancer Father   . Diabetes Brother   . Heart disease Brother    Past Surgical History:  Procedure Laterality Date  . A/V FISTULAGRAM Left 03/14/2017   Procedure: A/V Fistulagram;  Surgeon: Katha Cabal, MD;  Location: [place] [facility];  Service: Cardiovascular;  Laterality: Left;  . AV FISTULA PLACEMENT Left 11-04-2015  . AV FISTULA PLACEMENT Left 11/04/2015   Procedure: ARTERIOVENOUS (AV) FISTULA CREATION  ( BRACHIAL CEPHALIC );  Surgeon: Katha Cabal, MD;  Location: [facility];  Service: Vascular;  Laterality: Left;  . CARDIAC CATHETERIZATION    . CARDIAC CATHETERIZATION Left  07/19/2016   Procedure: Left Heart Cath and Coronary Angiography;  Surgeon: Corey Skains, MD;  Location: [place] [facility];  Service: Cardiovascular;  Laterality: Left;  . CORONARY ARTERY BYPASS GRAFT  2012  . CORONARY STENT PLACEMENT    . JOINT REPLACEMENT    . KNEE SURGERY Bilateral   . LEFT HEART CATH AND CORONARY ANGIOGRAPHY N/A 12/25/2017   Procedure: LEFT HEART CATH AND CORONARY ANGIOGRAPHY;  Surgeon: Teodoro Spray, MD;  Location: [place] [facility];  Service: Cardiovascular;  Laterality: N/A;  . LEFT HEART CATH AND CORONARY ANGIOGRAPHY Right 03/23/2018   Procedure: LEFT HEART CATH AND CORONARY ANGIOGRAPHY;  Surgeon: Dionisio David, MD;  Location: [place] [facility];  Service: Cardiovascular;  Laterality: Right;  . LEFT HEART CATH AND CORONARY ANGIOGRAPHY N/A 05/30/2018   Procedure: LEFT HEART CATH AND CORONARY ANGIOGRAPHY;  Surgeon: Corey Skains, MD;  Location: [place] [facility];  Service: Cardiovascular;  Laterality: N/A;  . PERIPHERAL VASCULAR CATHETERIZATION N/A 11/13/2015   Procedure: Dialysis/Perma Catheter Insertion;  Surgeon: Katha Cabal, MD;  Location: [place] [facility];  Service: Cardiovascular;  Laterality: N/A;  . PERIPHERAL VASCULAR CATHETERIZATION N/A 12/04/2015   Procedure: Dialysis/Perma Catheter Insertion;  Surgeon: Katha Cabal, MD;  Location: [place] [facility];  Service: Cardiovascular;  Laterality: N/A;  . PERIPHERAL VASCULAR  CATHETERIZATION Left 12/31/2015   Procedure: A/V Shuntogram/Fistulagram;  Surgeon: Algernon Huxley, MD;  Location: [place] [facility];  Service: Cardiovascular;  Laterality: Left;  . PERIPHERAL VASCULAR CATHETERIZATION N/A 12/31/2015   Procedure: A/V Shunt Intervention;  Surgeon: Algernon Huxley, MD;  Location: [place] [facility];  Service: Cardiovascular;  Laterality: N/A;  . PERIPHERAL VASCULAR CATHETERIZATION Left 02/25/2016   Procedure: A/V Shuntogram/Fistulagram;  Surgeon: Algernon Huxley, MD;  Location: [place] [facility];  Service: Cardiovascular;  Laterality: Left;  . PERIPHERAL VASCULAR CATHETERIZATION N/A 03/18/2016   Procedure: Dialysis/Perma Catheter Removal;  Surgeon: Katha Cabal, MD;  Location: [place] [facility];  Service: Cardiovascular;  Laterality: N/A;  . REMOVAL OF A DIALYSIS CATHETER N/A 06/30/2017   Procedure: REMOVAL OF A DIALYSIS CATHETER;  Surgeon: Katha Cabal, MD;  Location: [facility];  Service: Vascular;  Laterality: N/A;  . REPLACEMENT TOTAL KNEE BILATERAL Bilateral    Patient Active Problem List   Diagnosis Date Noted  . Acute on chronic respiratory failure with hypoxemia ([place]) 06/19/2018  . Acute respiratory failure ([place]) 06/19/2018  . Acute on chronic congestive heart failure ([place])   . ESRD on hemodialysis ([place])   . Palliative care by specialist   . Acute respiratory failure with hypoxemia ([place]) 04/18/2018  . Anemia of chronic disease 03/24/2018  . NSTEMI (non-ST elevated myocardial infarction) ([place]) 03/23/2018  . Acute on chronic diastolic CHF (congestive heart failure) ([place]) 03/02/2018  . GERD (gastroesophageal reflux disease) 03/02/2018  . Respiratory failure ([place]) 01/11/2018  . Abdominal pain 06/28/2017  . Anxiety, generalized 06/27/2017  . COPD (chronic obstructive pulmonary disease) ([place]) 06/26/2017  . Peritonitis ([place]) 05/28/2017  . Bilateral carotid artery stenosis 03/08/2017  . ESRD on dialysis ([place]) 03/08/2017  . Non-ST elevation myocardial infarction (NSTEMI), subendocardial infarction, subsequent episode of care ([place]) 11/04/2016  . Onychogryphosis 10/31/2016  . Onychomycosis 10/31/2016  . Subungual exostosis 10/31/2016  . Type 2 diabetes mellitus with diabetic neuropathy ([place]) 10/31/2016  . Chronic osteomyelitis of left hand ([place]) 09/03/2016  . Left arm swelling 08/30/2016  . Pre-operative clearance 08/30/2016  . Coronary artery disease of native artery of native heart with stable angina pectoris ([place]) 08/03/2016  . Benign paroxysmal positional  vertigo due to bilateral vestibular disorder 07/26/2016  . H/O: CVA (cerebrovascular accident) 07/26/2016  . Stable angina ([place]) 07/05/2016  . Sepsis due to pneumonia ([place]) 07/05/2016  . Fracture of left foot 04/26/2016  . Pre-transplant evaluation for kidney transplant 02/04/2016  . Chest pain at rest 12/17/2015  . Chest pain 12/17/2015  . DJD of shoulder 10/21/2015  . Perianal lesion 05/20/2015  . 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 Lumbar radiculopathy 05/14/2015  . 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 Benign essential hypertension 04/22/2015  . Grief 03/18/2015  . Lumbosacral facet joint syndrome 02/16/2015  . Greater trochanteric bursitis 02/16/2015  . DDD (degenerative disc disease), lumbosacral 01/20/2015  . 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 Osteoarthritis 01/20/2015  . IDA (iron deficiency anemia) 03/25/2014  . Thyroid nodule 01/15/2014  . Lung nodule 08/28/2013  . Tobacco abuse 05/17/2013  . Mixed hyperlipidemia 06/14/2011  . OSA on CPAP 06/14/2011  . PAD (peripheral artery disease) ([place]) 06/14/2011  . Depression 05/05/2011  . Chronic kidney disease (CKD), stage V ([place]) 03/26/2011  . Multiple vessel coronary artery disease 03/26/2011  . Obesity, unspecified 03/26/2011  . Type 2 diabetes mellitus ([place]) 03/26/2011      Prior to Admission medications  Medication Sig Start Date End Date Taking? Authorizing Provider  acetaminophen (TYLENOL) 325 MG tablet Take 2 tablets (650 mg total) by mouth every 6 (six) hours as needed for mild pain (or Fever >/= 101). 05/30/18   Awilda Bill, NP  albuterol (PROVENTIL) (2.5 MG/3ML) 0.083% nebulizer solution Take 3 mLs (2.5 mg total) by nebulization every 4 (four) hours as needed for wheezing or shortness of breath. 05/30/18   Awilda Bill, NP  alteplase (CATHFLO ACTIVASE) 2 MG injection 2 mg by Intracatheter route once as needed for open catheter. 05/30/18   Awilda Bill,  NP  amLODipine (NORVASC) 10 MG tablet Take 10 mg by mouth daily.    [provider]  aspirin 81 MG chewable tablet Chew 81 mg by mouth daily.    [provider]  atorvastatin (LIPITOR) 40 MG tablet Take 40 mg by mouth at bedtime. 03/28/18   [provider]  budesonide-formoterol (SYMBICORT) 160-4.5 MCG/ACT inhaler Inhale 2 puffs into the lungs 2 (two) times daily.    [provider]  calcium carbonate (TUMS - DOSED IN MG ELEMENTAL CALCIUM) 500 MG chewable tablet Chew 2 tablets by mouth 3 (three) times daily.    [provider]  cetirizine (ZYRTEC) 10 MG tablet Take 10 mg by mouth daily.    [provider]  Chlorhexidine Gluconate Cloth 2 % PADS Apply 6 each topically daily at 6 (six) AM. 05/31/18   Awilda Bill, NP  cinacalcet (SENSIPAR) 30 MG tablet Take 1 tablet (30 mg total) by mouth daily before breakfast. 05/31/18   Awilda Bill, NP  clopidogrel (PLAVIX) 75 MG tablet Take 1 tablet (75 mg total) by mouth daily. 05/31/18   Awilda Bill, NP  docusate sodium (COLACE) 100 MG capsule Take 1 capsule (100 mg total) by mouth 2 (two) times daily. 05/30/18   Awilda Bill, NP  fluticasone (FLONASE) 50 MCG/ACT nasal spray Place 2 sprays into both nostrils 2 (two) times daily.    [provider]  fluticasone furoate-vilanterol (BREO ELLIPTA) 200-25 MCG/INH AEPB Inhale 1 puff into the lungs daily. 05/31/18   Awilda Bill, NP  folic acid (FOLVITE) 1 MG tablet Take 1 mg by mouth daily.    [provider]  heparin 100-0.45 UNIT/ML-% infusion Inject 1,100 Units/hr into the vein continuous. Patient not taking: Reported on 06/19/2018 05/30/18   Awilda Bill, NP  heparin 1000 unit/mL SOLN injection 1 mL (1,000 Units total) by Dialysis route as needed (in dialysis). 05/30/18   Awilda Bill, NP  isosorbide mononitrate (IMDUR) 30 MG 24 hr tablet Take 30 mg by mouth daily.    [provider]  lidocaine (LIDODERM) 5 %  Place 1 patch onto the skin daily. Remove & Discard patch within 12 hours or as directed by MD Patient not taking: Reported on 06/19/2018 05/31/18   Awilda Bill, NP  lidocaine, PF, (XYLOCAINE) 1 % SOLN injection Inject 5 mLs into the skin as needed (topical anesthesia for hemodialysis ifGEBAUERS is ineffective.). 05/30/18   Awilda Bill, NP  lidocaine-prilocaine (EMLA) cream Apply 1 application topically as needed (topical anesthesia for hemodialysis if Gebauers and Lidocaine injection are ineffective.). 05/30/18   Awilda Bill, NP  lubiprostone (AMITIZA) 24 MCG capsule Take 24 mcg by mouth 2 (two) times daily with a meal.    [provider]  meclizine (ANTIVERT) 25 MG tablet Take 25 mg by mouth 3 (three) times daily as needed for dizziness.  [provider]  metoprolol succinate (TOPROL-XL) 100 MG 24 hr tablet Take 1 tablet (100 mg total) by mouth daily with breakfast. Take with or immediately following a meal. 05/31/18   Awilda Bill, NP  multivitamin (RENA-VIT) TABS tablet Take 1 tablet by mouth at bedtime. 05/30/18   Awilda Bill, NP  nitroGLYCERIN (NITROSTAT) 0.4 MG SL tablet Place 1 tablet (0.4 mg total) under the tongue every 5 (five) minutes as needed for chest pain. 05/30/18   Awilda Bill, NP  Nutritional Supplements (FEEDING SUPPLEMENT, NEPRO CARB STEADY,) LIQD Take 237 mLs by mouth daily. 05/31/18   Awilda Bill, NP  nystatin (MYCOSTATIN) 100000 UNIT/ML suspension Use as directed 5 mLs in the mouth or throat 4 (four) times daily.    [provider]  ondansetron (ZOFRAN) 4 MG tablet Take 1 tablet (4 mg total) by mouth every 6 (six) hours as needed for nausea. Patient not taking: Reported on 06/19/2018 05/30/18   Awilda Bill, NP  ondansetron [facility]) 4 MG/2ML SOLN injection Inject 2 mLs (4 mg total) into the vein every 6 (six) hours as needed for nausea. Patient not taking: Reported on 06/19/2018 05/30/18   Awilda Bill, NP  Oxycodone  HCl 10 MG TABS Take 10 mg by mouth every 6 (six) hours as needed (pain).    [provider]  pantoprazole (PROTONIX) 40 MG tablet Take 40 mg by mouth 2 (two) times daily.    [provider]  pentafluoroprop-tetrafluoroeth [place] [place]) AERO Apply 1 application topically as needed (topical anesthesia for hemodialysis). 05/30/18   Awilda Bill, NP  polyethylene glycol (MIRALAX / GLYCOLAX) packet Take 17 g by mouth daily. Patient not taking: Reported on 06/19/2018 05/31/18   Awilda Bill, NP  ranolazine (RANEXA) 500 MG 12 hr tablet Take 500 mg by mouth 2 (two) times daily. 04/13/18   [provider]  sevelamer carbonate (RENVELA) 800 MG tablet Take 1,600 mg by mouth 3 (three) times daily with meals.    [provider]  sodium chloride 0.9 % infusion Inject 100 mLs into the vein as needed (symptomatic hypotension or decrease in SBP < 90 mmHg). 05/30/18   Awilda Bill, NP  sodium chloride HYPERTONIC 3 % nebulizer solution Take 3 mLs by nebulization as needed for other.     [provider]  sodium zirconium cyclosilicate (LOKELMA) 5 g packet Take 10 g by mouth 3 (three) times daily. Patient not taking: Reported on 06/19/2018 05/30/18   Awilda Bill, NP  tiotropium (SPIRIVA HANDIHALER) 18 MCG inhalation capsule Place 1 capsule (18 mcg total) into inhaler and inhale daily. 05/31/18   Awilda Bill, NP  torsemide (DEMADEX) 100 MG tablet Take 100 mg by mouth daily.    [provider]  varenicline (CHANTIX) 0.5 MG tablet Take 1 tablet (0.5 mg total) by mouth daily. 05/30/18   Awilda Bill, NP  venlafaxine XR (EFFEXOR-XR) 37.5 MG 24 hr capsule Take 37.5 mg by mouth daily with breakfast.    [provider]    Allergies Nystatin; Sulfa antibiotics; and Niaspan [niacin]    Social History Social History   Tobacco Use  . Smoking status: Current Every Day Smoker    Packs/day: 0.25    Types: Cigarettes  . Smokeless tobacco: Never  Used  Substance Use Topics  . Alcohol use: No    Alcohol/week: 0.0 standard drinks  . Drug use: No    Review of Systems Patient denies headaches, rhinorrhea, blurry vision,  numbness, shortness of breath, chest pain, edema, cough, abdominal pain, nausea, vomiting, diarrhea, dysuria, fevers, rashes or hallucinations unless otherwise stated above in HPI. ____________________________________________   PHYSICAL EXAM:  VITAL SIGNS: Vitals:   06/26/18 1819 06/26/18 2000  BP: 138/67 (!) 148/74  Pulse:  82  Resp: 18 16  Temp: 98.7 F (37.1 C)   SpO2: 97% 100%    Constitutional: Alert and oriented.  Eyes: Conjunctivae are normal.  Head: Atraumatic. Nose: No congestion/rhinnorhea. Mouth/Throat: Mucous membranes are moist.   Neck: No stridor. Painless ROM.  Cardiovascular: Normal rate, regular rhythm. Grossly normal heart sounds.  Good peripheral circulation. Respiratory: Normal respiratory effort.  No retractions. Lungs CTAB. Gastrointestinal: Soft and nontender. No distention. No abdominal bruits. No CVA tenderness. Genitourinary: Soft formed stool in rectal vault Musculoskeletal: No lower extremity tenderness nor edema.  No joint effusions. Neurologic:  Normal speech and language. No gross focal neurologic deficits are appreciated. No facial droop Skin:  Skin is warm, dry and intact. No rash noted. Psychiatric: Mood and affect are normal. Speech and behavior are normal.  ____________________________________________   LABS (all labs ordered are listed, but only abnormal results are displayed)  Results for orders placed or performed during the hospital encounter of 06/26/18 (from the past 24 hour(s))  Basic metabolic panel     Status: Abnormal   Collection Time: 06/26/18  7:29 PM  Result Value Ref Range   Sodium 137 135 - 145 mmol/L   Potassium 3.1 (L) 3.5 - 5.1 mmol/L   Chloride 97 (L) 98 - 111 mmol/L   CO2 29 22 - 32 mmol/L   Glucose, Bld 124 (H) 70 - 99 mg/dL   BUN 22 8  - 23 mg/dL   Creatinine, Ser 4.52 (H) 0.44 - 1.00 mg/dL   Calcium 8.1 (L) 8.9 - 10.3 mg/dL   GFR calc non Af Amer 9 (L) >60 mL/min   GFR calc Af Amer 11 (L) >60 mL/min   Anion gap 11 5 - 15   ____________________________________________ ____________________________________________  RADIOLOGY  I personally reviewed all radiographic images ordered to evaluate for the above acute complaints and reviewed radiology reports and findings.  These findings were personally discussed with the patient.  Please see medical record for radiology report.  ____________________________________________   PROCEDURES  Procedure(s) performed:  Procedures    Critical Care performed: no ____________________________________________   INITIAL IMPRESSION / ASSESSMENT AND PLAN / ED COURSE  Pertinent labs & imaging results that were available during my care of the patient were reviewed by me and considered in my medical decision making (see chart for details).   DDX: Constipation, fecal impaction, obstruction, electrolyte abnormality  IMAAN PADGETT is a 67 y.o. who presents to the ED with symptoms as described above.  She is afebrile [place] dynamically stable.  Upon arrival to ER she did move her bowels with resolution of her abdominal pain.  Is not clinically consistent with obstruction.  X-ray does show evidence of significant stool burden.  Blood work shows no evidence of hyperkalemia.  Mild hypocalcemia which is chronic.  Patient tolerating oral hydration.  At this point I do believe she stable and appropriate for further outpatient follow-up.      As part of my medical decision making, I reviewed the following data within the [facility] notes reviewed and incorporated, Labs reviewed, notes from prior ED visits and [place] Controlled Substance Database   ____________________________________________   FINAL CLINICAL IMPRESSION(S) / ED DIAGNOSES  Final diagnoses:  Generalized abdominal pain  Constipation, unspecified constipation type      NEW MEDICATIONS STARTED DURING THIS VISIT:  New Prescriptions   No medications on file     Note:  This document was prepared using Dragon voice recognition software and may include unintentional dictation errors.    Merlyn Lot, MD 06/26/18 2017

## 2018-06-26 NOTE — ED Notes (Signed)
Pt states just had a bowel movement in the room. Pt states is feeling much better.

## 2018-06-26 NOTE — Telephone Encounter (Signed)
Flagged on EMMI report for not knowing who to call about changes in condition and for not having a follow up scheduled.  Called and spoke with patient.  She reports having a follow up appointment scheduled tomorrow with [facility] in [place].  She had a question about the possibility of seeing someone for her diabetes.  Encouraged her to speak to her provider when she sees them tomorrow to see what resources [facility] has available for diabetic lifestyle teaching.  No further questions or concerns currently.  I thanked her for her time and informed him she would receive one more automated call checking in during the next few days.

## 2018-06-26 NOTE — Discharge Instructions (Addendum)
To help reduce her constipation you can mix a tablespoon of mineral oil with 8 ounces of prune juice and drink.  I do not recommend doing more than 1 of these a day and only do as needed for severe constipation.   You have been seen in the emergency department for emergency care. It is important that you contact your own doctor, specialist or the closest clinic for follow-up care. Please bring this instruction sheet, all medications and X-ray copies with you when you are seen for follow-up care.  Determining the exact cause for all patients with abdominal pain is extremely difficult in the emergency department. Our primary focus is to rule-out immediate life-threatening diseases. If no immediate source of pain is found the definitive diagnosis frequently needs to be determined over time.Many times your primary care physician can determine the cause by following the symptoms over time. Sometimes, specialist are required such as Gastroenterologists, Gynecologists, Urologists or Surgeons. Please return immediately to the Emergency Department for fever>101, Vomiting or Intractable Pain. You should return to the emergency department or see your primary care provider in 12-24hrs if your pain is no better and sooner if your pain becomes worse.

## 2018-06-26 NOTE — ED Triage Notes (Signed)
Pt presents to ED via [facility] with c/o constipation. Per EMS pt has not had a bowel movement in 2 weeks, pt has been taking Lactulose and stool softeners without relief, pt began having lower abdominal cramping and rectal pain. pt is a dialysis patient. 147/78, 85hr, chronic 2L O2 use home, dialysis fistula to L upper arm.

## 2018-06-26 NOTE — ED Notes (Signed)
Patient discharged to home per MD order. Patient in stable condition, and deemed medically cleared by ED provider for discharge. Discharge instructions reviewed with patient/family using "Teach Back"; verbalized understanding of medication education and administration, and information about follow-up care. Denies further concerns. ° °

## 2018-08-14 ENCOUNTER — Emergency Department
Admission: EM | Admit: 2018-08-14 | Discharge: 2018-08-14 | Disposition: A | Discharge status: Home / Self Care | Payer: Medicare Other | Attending: Emergency Medicine | Admitting: Emergency Medicine

## 2018-08-14 ENCOUNTER — Other Ambulatory Visit: Payer: Self-pay

## 2018-08-14 DIAGNOSIS — I120 Hypertensive chronic kidney disease with stage 5 chronic kidney disease or end stage renal disease: Secondary | ICD-10-CM | POA: Insufficient documentation

## 2018-08-14 DIAGNOSIS — Z955 Presence of coronary angioplasty implant and graft: Secondary | ICD-10-CM | POA: Diagnosis not present

## 2018-08-14 DIAGNOSIS — Z992 Dependence on renal dialysis: Secondary | ICD-10-CM | POA: Insufficient documentation

## 2018-08-14 DIAGNOSIS — Z8673 Personal history of transient ischemic attack (TIA), and cerebral infarction without residual deficits: Secondary | ICD-10-CM | POA: Diagnosis not present

## 2018-08-14 DIAGNOSIS — Z79899 Other long term (current) drug therapy: Secondary | ICD-10-CM | POA: Insufficient documentation

## 2018-08-14 DIAGNOSIS — Z7902 Long term (current) use of antithrombotics/antiplatelets: Secondary | ICD-10-CM | POA: Diagnosis not present

## 2018-08-14 DIAGNOSIS — Z951 Presence of aortocoronary bypass graft: Secondary | ICD-10-CM | POA: Diagnosis not present

## 2018-08-14 DIAGNOSIS — N186 End stage renal disease: Secondary | ICD-10-CM | POA: Insufficient documentation

## 2018-08-14 DIAGNOSIS — J449 Chronic obstructive pulmonary disease, unspecified: Secondary | ICD-10-CM | POA: Diagnosis not present

## 2018-08-14 DIAGNOSIS — Z96651 Presence of right artificial knee joint: Secondary | ICD-10-CM | POA: Insufficient documentation

## 2018-08-14 DIAGNOSIS — Z96652 Presence of left artificial knee joint: Secondary | ICD-10-CM | POA: Diagnosis not present

## 2018-08-14 DIAGNOSIS — E1122 Type 2 diabetes mellitus with diabetic chronic kidney disease: Secondary | ICD-10-CM | POA: Insufficient documentation

## 2018-08-14 DIAGNOSIS — I252 Old myocardial infarction: Secondary | ICD-10-CM | POA: Insufficient documentation

## 2018-08-14 DIAGNOSIS — I2510 Atherosclerotic heart disease of native coronary artery without angina pectoris: Secondary | ICD-10-CM | POA: Diagnosis not present

## 2018-08-14 DIAGNOSIS — R202 Paresthesia of skin: Secondary | ICD-10-CM | POA: Diagnosis not present

## 2018-08-14 DIAGNOSIS — R200 Anesthesia of skin: Secondary | ICD-10-CM | POA: Diagnosis present

## 2018-08-14 DIAGNOSIS — F17210 Nicotine dependence, cigarettes, uncomplicated: Secondary | ICD-10-CM | POA: Insufficient documentation

## 2018-08-14 LAB — CBC WITH DIFFERENTIAL/PLATELET
ABS IMMATURE GRANULOCYTES: 0.01 10*3/uL (ref 0.00–0.07)
BASOS PCT: 1 %
Basophils Absolute: 0 10*3/uL (ref 0.0–0.1)
EOS ABS: 0.1 10*3/uL (ref 0.0–0.5)
Eosinophils Relative: 2 %
HEMATOCRIT: 30.3 % — AB (ref 36.0–46.0)
Hemoglobin: 9.1 g/dL — ABNORMAL LOW (ref 12.0–15.0)
IMMATURE GRANULOCYTES: 0 %
LYMPHS ABS: 1 10*3/uL (ref 0.7–4.0)
Lymphocytes Relative: 19 %
MCH: 29.7 pg (ref 26.0–34.0)
MCHC: 30 g/dL (ref 30.0–36.0)
MCV: 99 fL (ref 80.0–100.0)
MONO ABS: 0.6 10*3/uL (ref 0.1–1.0)
MONOS PCT: 11 %
Neutro Abs: 3.5 10*3/uL (ref 1.7–7.7)
Neutrophils Relative %: 67 %
PLATELETS: 185 10*3/uL (ref 150–400)
RBC: 3.06 MIL/uL — ABNORMAL LOW (ref 3.87–5.11)
RDW: 16.7 % — ABNORMAL HIGH (ref 11.5–15.5)
WBC: 5.2 10*3/uL (ref 4.0–10.5)
nRBC: 0 % (ref 0.0–0.2)

## 2018-08-14 LAB — BASIC METABOLIC PANEL
ANION GAP: 12 (ref 5–15)
BUN: 23 mg/dL (ref 8–23)
CALCIUM: 8.5 mg/dL — AB (ref 8.9–10.3)
CO2: 28 mmol/L (ref 22–32)
Chloride: 102 mmol/L (ref 98–111)
Creatinine, Ser: 4.77 mg/dL — ABNORMAL HIGH (ref 0.44–1.00)
GFR, EST AFRICAN AMERICAN: 10 mL/min — AB (ref >60)
GFR, EST NON AFRICAN AMERICAN: 9 mL/min — AB (ref >60)
GLUCOSE: 109 mg/dL — AB (ref 70–99)
Potassium: 3.9 mmol/L (ref 3.5–5.1)
SODIUM: 142 mmol/L (ref 135–145)

## 2018-08-14 NOTE — ED Notes (Signed)
Patient reports went to dialysis today. Went home afterwards, felt a need to have a bowel movement during the bowel movement felt like she had numbness to left arm. NIH scale score 0. Awaiting md eval and plan of care.

## 2018-08-14 NOTE — Discharge Instructions (Addendum)
Continue your normal medications as prescribed as well as your normal dialysis.  Follow-up with your regular doctor.  Return to the ER for new, worsening, persistent numbness, weakness, vision changes, speech problems, confusion, or any other new or worsening symptoms that concern you.

## 2018-08-14 NOTE — ED Provider Notes (Signed)
[facility] Emergency Department Provider Note ____________________________________________   First MD Initiated Contact with Patient 08/14/18 1741     (approximate)  I have reviewed the triage vital signs and the nursing notes.   HISTORY  Chief Complaint Numbness    HPI Kimberly Shaffer is a 67 y.o. female with PMH as noted below including ESRD on dialysis (last dialysis earlier today) who presents with numbness, acute onset about 1 hour prior to coming to the hospital, mainly in the left arm but also into her left torso and the upper part of her left leg.  She describes it as a tingling sensation.  The patient states that then she suddenly felt the need to have a bowel movement.  She states after the bowel movement the numbness/tingling significantly improved and is now completely resolved.  She denies any weakness, vision changes, confusion, speech disturbance, or other acute symptoms.  She states she is asymptomatic at this time.   Past Medical History:  Diagnosis Date  . Asthma   . CAD (coronary artery disease)   . Chronic lower back pain   . COPD (chronic obstructive pulmonary disease) ([place])   . Depression   . Diabetes mellitus without complication (HCC)    NIDD  . H/O angina pectoris   . H/O blood clots   . Hypercholesteremia   . Hypertension   . Left rotator cuff tear   . Myocardial infarction ([place])   . Obesity   . Renal insufficiency   . Stroke ([place])   . Vertigo, aural     Patient Active Problem List   Diagnosis Date Noted  . Acute on chronic respiratory failure with hypoxemia ([place]) 06/19/2018  . Acute respiratory failure ([place]) 06/19/2018  . Acute on chronic congestive heart failure ([place])   . ESRD on hemodialysis ([place])   . Palliative care by specialist   . Acute respiratory failure with hypoxemia ([place]) 04/18/2018  . Anemia of chronic disease 03/24/2018  . NSTEMI (non-ST elevated myocardial infarction) ([place]) 03/23/2018  . Acute on  chronic diastolic CHF (congestive heart failure) ([place]) 03/02/2018  . GERD (gastroesophageal reflux disease) 03/02/2018  . Respiratory failure ([place]) 01/11/2018  . Abdominal pain 06/28/2017  . Anxiety, generalized 06/27/2017  . COPD (chronic obstructive pulmonary disease) ([place]) 06/26/2017  . Peritonitis ([place]) 05/28/2017  . Bilateral carotid artery stenosis 03/08/2017  . ESRD on dialysis ([place]) 03/08/2017  . Non-ST elevation myocardial infarction (NSTEMI), subendocardial infarction, subsequent episode of care ([place]) 11/04/2016  . Onychogryphosis 10/31/2016  . Onychomycosis 10/31/2016  . Subungual exostosis 10/31/2016  . Type 2 diabetes mellitus with diabetic neuropathy ([place]) 10/31/2016  . Chronic osteomyelitis of left hand ([place]) 09/03/2016  . Left arm swelling 08/30/2016  . Pre-operative clearance 08/30/2016  . Coronary artery disease of native artery of native heart with stable angina pectoris ([place]) 08/03/2016  . Benign paroxysmal positional vertigo due to bilateral vestibular disorder 07/26/2016  . H/O: CVA (cerebrovascular accident) 07/26/2016  . Stable angina ([place]) 07/05/2016  . Sepsis due to pneumonia ([place]) 07/05/2016  . Fracture of left foot 04/26/2016  . Pre-transplant evaluation for kidney transplant 02/04/2016  . Chest pain at rest 12/17/2015  . Chest pain 12/17/2015  . DJD of shoulder 10/21/2015  . Perianal lesion 05/20/2015  . 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 Lumbar radiculopathy 05/14/2015  . 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 Benign essential hypertension 04/22/2015  . Grief 03/18/2015  . Lumbosacral facet joint syndrome 02/16/2015  . Greater trochanteric bursitis 02/16/2015  .  DDD (degenerative disc disease), lumbosacral 01/20/2015  . 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 Osteoarthritis 01/20/2015  . IDA (iron deficiency anemia) 03/25/2014  . Thyroid nodule 01/15/2014  . Lung nodule 08/28/2013  . Tobacco abuse  05/17/2013  . Mixed hyperlipidemia 06/14/2011  . OSA on CPAP 06/14/2011  . PAD (peripheral artery disease) ([place]) 06/14/2011  . Depression 05/05/2011  . Chronic kidney disease (CKD), stage V ([place]) 03/26/2011  . Multiple vessel coronary artery disease 03/26/2011  . Obesity, unspecified 03/26/2011  . Type 2 diabetes mellitus ([place]) 03/26/2011    Past Surgical History:  Procedure Laterality Date  . A/V FISTULAGRAM Left 03/14/2017   Procedure: A/V Fistulagram;  Surgeon: Katha Cabal, MD;  Location: [place] [facility];  Service: Cardiovascular;  Laterality: Left;  . AV FISTULA PLACEMENT Left 11-04-2015  . AV FISTULA PLACEMENT Left 11/04/2015   Procedure: ARTERIOVENOUS (AV) FISTULA CREATION  ( BRACHIAL CEPHALIC );  Surgeon: Katha Cabal, MD;  Location: [facility];  Service: Vascular;  Laterality: Left;  . CARDIAC CATHETERIZATION    . CARDIAC CATHETERIZATION Left 07/19/2016   Procedure: Left Heart Cath and Coronary Angiography;  Surgeon: Corey Skains, MD;  Location: [place] [facility];  Service: Cardiovascular;  Laterality: Left;  . CORONARY ARTERY BYPASS GRAFT  2012  . CORONARY STENT PLACEMENT    . JOINT REPLACEMENT    . KNEE SURGERY Bilateral   . LEFT HEART CATH AND CORONARY ANGIOGRAPHY N/A 12/25/2017   Procedure: LEFT HEART CATH AND CORONARY ANGIOGRAPHY;  Surgeon: Teodoro Spray, MD;  Location: [place] [facility];  Service: Cardiovascular;  Laterality: N/A;  . LEFT HEART CATH AND CORONARY ANGIOGRAPHY Right 03/23/2018   Procedure: LEFT HEART CATH AND CORONARY ANGIOGRAPHY;  Surgeon: Dionisio David, MD;  Location: [place] [facility];  Service: Cardiovascular;  Laterality: Right;  . LEFT HEART CATH AND CORONARY ANGIOGRAPHY N/A 05/30/2018   Procedure: LEFT HEART CATH AND CORONARY ANGIOGRAPHY;  Surgeon: Corey Skains, MD;  Location: [place] [facility];  Service: Cardiovascular;  Laterality: N/A;  . PERIPHERAL VASCULAR CATHETERIZATION N/A 11/13/2015   Procedure: Dialysis/Perma  Catheter Insertion;  Surgeon: Katha Cabal, MD;  Location: [place] [facility];  Service: Cardiovascular;  Laterality: N/A;  . PERIPHERAL VASCULAR CATHETERIZATION N/A 12/04/2015   Procedure: Dialysis/Perma Catheter Insertion;  Surgeon: Katha Cabal, MD;  Location: [place] [facility];  Service: Cardiovascular;  Laterality: N/A;  . PERIPHERAL VASCULAR CATHETERIZATION Left 12/31/2015   Procedure: A/V Shuntogram/Fistulagram;  Surgeon: Algernon Huxley, MD;  Location: [place] [facility];  Service: Cardiovascular;  Laterality: Left;  . PERIPHERAL VASCULAR CATHETERIZATION N/A 12/31/2015   Procedure: A/V Shunt Intervention;  Surgeon: Algernon Huxley, MD;  Location: [place] [facility];  Service: Cardiovascular;  Laterality: N/A;  . PERIPHERAL VASCULAR CATHETERIZATION Left 02/25/2016   Procedure: A/V Shuntogram/Fistulagram;  Surgeon: Algernon Huxley, MD;  Location: [place] [facility];  Service: Cardiovascular;  Laterality: Left;  . PERIPHERAL VASCULAR CATHETERIZATION N/A 03/18/2016   Procedure: Dialysis/Perma Catheter Removal;  Surgeon: Katha Cabal, MD;  Location: [place] [facility];  Service: Cardiovascular;  Laterality: N/A;  . REMOVAL OF A DIALYSIS CATHETER N/A 06/30/2017   Procedure: REMOVAL OF A DIALYSIS CATHETER;  Surgeon: Katha Cabal, MD;  Location: [facility];  Service: Vascular;  Laterality: N/A;  . REPLACEMENT TOTAL KNEE BILATERAL Bilateral     Prior to Admission medications   Medication Sig Start Date End Date Taking? Authorizing  Provider  acetaminophen (TYLENOL) 325 MG tablet Take 2 tablets (650 mg total) by mouth every 6 (six) hours as needed for mild pain (or Fever >/= 101). 05/30/18   Awilda Bill, NP  albuterol (PROVENTIL) (2.5 MG/3ML) 0.083% nebulizer solution Take 3 mLs (2.5 mg total) by nebulization every 4 (four) hours as needed for wheezing or shortness of breath. 05/30/18   Awilda Bill, NP  alteplase (CATHFLO ACTIVASE) 2 MG injection 2 mg by Intracatheter route  once as needed for open catheter. 05/30/18   Awilda Bill, NP  amLODipine (NORVASC) 10 MG tablet Take 10 mg by mouth daily.    [provider]  aspirin 81 MG chewable tablet Chew 81 mg by mouth daily.    [provider]  atorvastatin (LIPITOR) 40 MG tablet Take 40 mg by mouth at bedtime. 03/28/18   [provider]  budesonide-formoterol (SYMBICORT) 160-4.5 MCG/ACT inhaler Inhale 2 puffs into the lungs 2 (two) times daily.    [provider]  calcium carbonate (TUMS - DOSED IN MG ELEMENTAL CALCIUM) 500 MG chewable tablet Chew 2 tablets by mouth 3 (three) times daily.    [provider]  cetirizine (ZYRTEC) 10 MG tablet Take 10 mg by mouth daily.    [provider]  Chlorhexidine Gluconate Cloth 2 % PADS Apply 6 each topically daily at 6 (six) AM. 05/31/18   Awilda Bill, NP  cinacalcet (SENSIPAR) 30 MG tablet Take 1 tablet (30 mg total) by mouth daily before breakfast. 05/31/18   Awilda Bill, NP  clopidogrel (PLAVIX) 75 MG tablet Take 1 tablet (75 mg total) by mouth daily. 05/31/18   Awilda Bill, NP  docusate sodium (COLACE) 100 MG capsule Take 1 capsule (100 mg total) by mouth 2 (two) times daily. 05/30/18   Awilda Bill, NP  fluticasone (FLONASE) 50 MCG/ACT nasal spray Place 2 sprays into both nostrils 2 (two) times daily.    [provider]  fluticasone furoate-vilanterol (BREO ELLIPTA) 200-25 MCG/INH AEPB Inhale 1 puff into the lungs daily. 05/31/18   Awilda Bill, NP  folic acid (FOLVITE) 1 MG tablet Take 1 mg by mouth daily.    [provider]  heparin 100-0.45 UNIT/ML-% infusion Inject 1,100 Units/hr into the vein continuous. Patient not taking: Reported on 06/19/2018 05/30/18   Awilda Bill, NP  heparin 1000 unit/mL SOLN injection 1 mL (1,000 Units total) by Dialysis route as needed (in dialysis). 05/30/18   Awilda Bill, NP  isosorbide mononitrate (IMDUR) 30 MG 24 hr tablet Take 30 mg by mouth  daily.    [provider]  lidocaine (LIDODERM) 5 % Place 1 patch onto the skin daily. Remove & Discard patch within 12 hours or as directed by MD Patient not taking: Reported on 06/19/2018 05/31/18   Awilda Bill, NP  lidocaine, PF, (XYLOCAINE) 1 % SOLN injection Inject 5 mLs into the skin as needed (topical anesthesia for hemodialysis ifGEBAUERS is ineffective.). 05/30/18   Awilda Bill, NP  lidocaine-prilocaine (EMLA) cream Apply 1 application topically as needed (topical anesthesia for hemodialysis if Gebauers and Lidocaine injection are ineffective.). 05/30/18   Awilda Bill, NP  lubiprostone (AMITIZA) 24 MCG capsule Take 24 mcg by mouth 2 (two) times daily with a meal.    [provider]  meclizine (ANTIVERT) 25 MG tablet Take 25 mg by mouth 3 (three) times daily as needed for dizziness.    [provider]  metoprolol succinate (TOPROL-XL) 100  MG 24 hr tablet Take 1 tablet (100 mg total) by mouth daily with breakfast. Take with or immediately following a meal. 05/31/18   Awilda Bill, NP  multivitamin (RENA-VIT) TABS tablet Take 1 tablet by mouth at bedtime. 05/30/18   Awilda Bill, NP  nitroGLYCERIN (NITROSTAT) 0.4 MG SL tablet Place 1 tablet (0.4 mg total) under the tongue every 5 (five) minutes as needed for chest pain. 05/30/18   Awilda Bill, NP  Nutritional Supplements (FEEDING SUPPLEMENT, NEPRO CARB STEADY,) LIQD Take 237 mLs by mouth daily. 05/31/18   Awilda Bill, NP  nystatin (MYCOSTATIN) 100000 UNIT/ML suspension Use as directed 5 mLs in the mouth or throat 4 (four) times daily.    [provider]  ondansetron (ZOFRAN) 4 MG tablet Take 1 tablet (4 mg total) by mouth every 6 (six) hours as needed for nausea. Patient not taking: Reported on 06/19/2018 05/30/18   Awilda Bill, NP  ondansetron [facility]) 4 MG/2ML SOLN injection Inject 2 mLs (4 mg total) into the vein every 6 (six) hours as needed for nausea. Patient not taking:  Reported on 06/19/2018 05/30/18   Awilda Bill, NP  Oxycodone HCl 10 MG TABS Take 10 mg by mouth every 6 (six) hours as needed (pain).    [provider]  pantoprazole (PROTONIX) 40 MG tablet Take 40 mg by mouth 2 (two) times daily.    [provider]  pentafluoroprop-tetrafluoroeth [place]) AERO Apply 1 application topically as needed (topical anesthesia for hemodialysis). 05/30/18   Awilda Bill, NP  polyethylene glycol (MIRALAX / GLYCOLAX) packet Take 17 g by mouth daily. Patient not taking: Reported on 06/19/2018 05/31/18   Awilda Bill, NP  ranolazine (RANEXA) 500 MG 12 hr tablet Take 500 mg by mouth 2 (two) times daily. 04/13/18   [provider]  sevelamer carbonate (RENVELA) 800 MG tablet Take 1,600 mg by mouth 3 (three) times daily with meals.    [provider]  sodium chloride 0.9 % infusion Inject 100 mLs into the vein as needed (symptomatic hypotension or decrease in SBP < 90 mmHg). 05/30/18   Awilda Bill, NP  sodium chloride HYPERTONIC 3 % nebulizer solution Take 3 mLs by nebulization as needed for other.     [provider]  sodium zirconium cyclosilicate (LOKELMA) 5 g packet Take 10 g by mouth 3 (three) times daily. Patient not taking: Reported on 06/19/2018 05/30/18   Awilda Bill, NP  tiotropium (SPIRIVA HANDIHALER) 18 MCG inhalation capsule Place 1 capsule (18 mcg total) into inhaler and inhale daily. 05/31/18   Awilda Bill, NP  torsemide (DEMADEX) 100 MG tablet Take 100 mg by mouth daily.    [provider]  varenicline (CHANTIX) 0.5 MG tablet Take 1 tablet (0.5 mg total) by mouth daily. 05/30/18   Awilda Bill, NP  venlafaxine XR (EFFEXOR-XR) 37.5 MG 24 hr capsule Take 37.5 mg by mouth daily with breakfast.    [provider]    Allergies Nystatin; Sulfa antibiotics; and Niaspan [niacin]  Family History  Problem Relation Age of Onset  . Cancer Mother   . Cancer Father   . Diabetes Brother    . Heart disease Brother     Social History Social History   Tobacco Use  . Smoking status: Current Every Day Smoker    Packs/day: 0.25    Types: Cigarettes  . Smokeless tobacco: Never Used  Substance Use Topics  . Alcohol use: No  Alcohol/week: 0.0 standard drinks  . Drug use: No    Review of Systems  Constitutional: No fever. Eyes: No visual changes. ENT: No neck pain. Cardiovascular: Denies chest pain. Respiratory: Denies shortness of breath. Gastrointestinal: No vomiting or diarrhea.  Genitourinary: Negative for dysuria.  Musculoskeletal: Negative for acute back pain. Skin: Negative for rash. Neurological: Positive for headache and resolved numbness.  Negative for weakness.   ____________________________________________   PHYSICAL EXAM:  VITAL SIGNS: ED Triage Vitals  Enc Vitals Group     BP 08/14/18 1728 (!) 141/65     Pulse --      Resp 08/14/18 1728 20     Temp 08/14/18 1728 98.4 F (36.9 C)     Temp Source 08/14/18 1728 Oral     SpO2 08/14/18 1728 100 %     Weight 08/14/18 1729 222 lb (100.7 kg)     Height 08/14/18 1729 5\' 6"  (1.676 m)     Head Circumference --      Peak Flow --      Pain Score 08/14/18 1729 0     Pain Loc --      Pain Edu? --      Excl. in [place]? --     Constitutional: Alert and oriented.  Relatively well appearing and in no acute distress. Eyes: Conjunctivae are normal.  EOMI.  PERRLA. Head: Atraumatic. Nose: No congestion/rhinnorhea. Mouth/Throat: Mucous membranes are moist.   Neck: Normal range of motion.  Cardiovascular: Normal rate, regular rhythm. Grossly normal heart sounds.  Good peripheral circulation. Respiratory: Normal respiratory effort.  No retractions. Lungs CTAB. Gastrointestinal: Soft and nontender. No distention.  Genitourinary: No flank tenderness. Musculoskeletal: No lower extremity edema.  Extremities warm and well perfused.  Neurologic:  Normal speech and language.  5/5 motor strength and intact  sensation to all extremities.  No facial droop.  Normal coordination with no ataxia on finger-to-nose.  No pronator drift. Skin:  Skin is warm and dry. No rash noted. Psychiatric: Mood and affect are normal. Speech and behavior are normal.  ____________________________________________   LABS (all labs ordered are listed, but only abnormal results are displayed)  Labs Reviewed  BASIC METABOLIC PANEL - Abnormal; Notable for the following components:      Result Value   Glucose, Bld 109 (*)    Creatinine, Ser 4.77 (*)    Calcium 8.5 (*)    GFR calc non Af Amer 9 (*)    GFR calc Af Amer 10 (*)    All other components within normal limits  CBC WITH DIFFERENTIAL/PLATELET - Abnormal; Notable for the following components:   RBC 3.06 (*)    Hemoglobin 9.1 (*)    HCT 30.3 (*)    RDW 16.7 (*)    All other components within normal limits   ____________________________________________  EKG  ED ECG REPORT I, Arta Silence, the attending physician, personally viewed and interpreted this ECG.  Date: 08/14/2018 EKG Time: 1726 Rate: 67 Rhythm: normal sinus rhythm QRS Axis: normal Intervals: normal ST/T Wave abnormalities: Repolarization abnormality with nonspecific ST abnormalities laterally Narrative Interpretation: Nonspecific lateral ST abnormalities with no significant change when compared to EKG of 06/19/2018; no evidence of acute ischemia  ____________________________________________  RADIOLOGY    ____________________________________________   PROCEDURES  Procedure(s) performed: No  Procedures  Critical Care performed: No ____________________________________________   INITIAL IMPRESSION / ASSESSMENT AND PLAN / ED COURSE  Pertinent labs & imaging results that were available during my care of the patient were reviewed by me  and considered in my medical decision making (see chart for details).  67 year old female with PMH as noted above and status post dialysis  earlier today presents with an episode of numbness/tingling in the left side of her body, mainly involving the left arm and left side of her torso, and now resolved after a bowel movement.  She denies any associated weakness or other acute neurologic symptoms.  I reviewed the past medical records in Epic; the patient was last admitted last month for COPD exacerbation.  She has no recent prior visits with symptoms similar to those she is having today.  On exam the patient is well-appearing and her vital signs are normal.  Her neurologic exam is also normal.  The remainder of the exam is unremarkable.  EKG shows no acute changes.  Overall the fact that the symptoms were so brief and resolved after a bowel movement suggest a benign cause such as some type of vagal reaction or radicular symptoms.  Since the symptoms did not follow any specific neurologic distribution other than only being on the left, there is no clinical evidence for CVA/TIA and no indication for emergent imaging or stroke work-up.  The patient herself feels completely back to normal and would like to go home.  I will obtain basic labs to rule out any electrolyte abnormalities given the patient's renal history.  If this is negative and the patient remains asymptomatic I anticipate discharge home.  ----------------------------------------- 7:28 PM on 08/14/2018 -----------------------------------------  The lab work-up is unremarkable.  The patient continues to be asymptomatic.  She would like to go home, and she is stable for discharge at this time.  I gave her thorough return precautions and she expressed understanding. ____________________________________________   FINAL CLINICAL IMPRESSION(S) / ED DIAGNOSES  Final diagnoses:  Paresthesia      NEW MEDICATIONS STARTED DURING THIS VISIT:  New Prescriptions   No medications on file     Note:  This document was prepared using Dragon voice recognition software and may  include unintentional dictation errors.    Arta Silence, MD 08/14/18 [place]

## 2018-08-14 NOTE — ED Notes (Signed)
..  DisPatient verbalizes understanding of d/c instructions, medications, and follow-up. Vital signs stable and pain controlled per pt. Patient In Not in Acute Distress at time of discharge and denies further concerns regarding this visit. Patient stable at the time of departure from the unit, departing unit by the safest and most appropriate manner per patient condition and limitations with all belongings accounted for. Patient advised to return to the ED at any time for emergent concerns, or for new/worsening symptoms.   Pt was placed in lobby with fresh tank of O2 first RN is aware. Pt is awaiting her daughter to come get her and requested to wait in lobby

## 2018-08-14 NOTE — ED Triage Notes (Signed)
States dialyses today and began to have left side numbness.

## 2018-09-24 ENCOUNTER — Other Ambulatory Visit: Payer: Self-pay

## 2018-09-24 ENCOUNTER — Emergency Department: Payer: Medicare Other

## 2018-09-24 DIAGNOSIS — E1122 Type 2 diabetes mellitus with diabetic chronic kidney disease: Secondary | ICD-10-CM | POA: Diagnosis not present

## 2018-09-24 DIAGNOSIS — J45909 Unspecified asthma, uncomplicated: Secondary | ICD-10-CM | POA: Insufficient documentation

## 2018-09-24 DIAGNOSIS — F17210 Nicotine dependence, cigarettes, uncomplicated: Secondary | ICD-10-CM | POA: Diagnosis not present

## 2018-09-24 DIAGNOSIS — I509 Heart failure, unspecified: Secondary | ICD-10-CM | POA: Insufficient documentation

## 2018-09-24 DIAGNOSIS — Z7982 Long term (current) use of aspirin: Secondary | ICD-10-CM | POA: Diagnosis not present

## 2018-09-24 DIAGNOSIS — F329 Major depressive disorder, single episode, unspecified: Secondary | ICD-10-CM | POA: Insufficient documentation

## 2018-09-24 DIAGNOSIS — Z96653 Presence of artificial knee joint, bilateral: Secondary | ICD-10-CM | POA: Diagnosis not present

## 2018-09-24 DIAGNOSIS — Z8673 Personal history of transient ischemic attack (TIA), and cerebral infarction without residual deficits: Secondary | ICD-10-CM | POA: Diagnosis not present

## 2018-09-24 DIAGNOSIS — I132 Hypertensive heart and chronic kidney disease with heart failure and with stage 5 chronic kidney disease, or end stage renal disease: Secondary | ICD-10-CM | POA: Insufficient documentation

## 2018-09-24 DIAGNOSIS — R51 Headache: Secondary | ICD-10-CM | POA: Diagnosis present

## 2018-09-24 DIAGNOSIS — J449 Chronic obstructive pulmonary disease, unspecified: Secondary | ICD-10-CM | POA: Diagnosis not present

## 2018-09-24 DIAGNOSIS — Z7902 Long term (current) use of antithrombotics/antiplatelets: Secondary | ICD-10-CM | POA: Insufficient documentation

## 2018-09-24 DIAGNOSIS — I252 Old myocardial infarction: Secondary | ICD-10-CM | POA: Insufficient documentation

## 2018-09-24 DIAGNOSIS — Z79899 Other long term (current) drug therapy: Secondary | ICD-10-CM | POA: Diagnosis not present

## 2018-09-24 DIAGNOSIS — Z992 Dependence on renal dialysis: Secondary | ICD-10-CM | POA: Insufficient documentation

## 2018-09-24 DIAGNOSIS — Z951 Presence of aortocoronary bypass graft: Secondary | ICD-10-CM | POA: Insufficient documentation

## 2018-09-24 DIAGNOSIS — I2510 Atherosclerotic heart disease of native coronary artery without angina pectoris: Secondary | ICD-10-CM | POA: Diagnosis not present

## 2018-09-24 DIAGNOSIS — F419 Anxiety disorder, unspecified: Secondary | ICD-10-CM | POA: Insufficient documentation

## 2018-09-24 DIAGNOSIS — N186 End stage renal disease: Secondary | ICD-10-CM | POA: Diagnosis not present

## 2018-09-24 DIAGNOSIS — J810 Acute pulmonary edema: Secondary | ICD-10-CM | POA: Diagnosis not present

## 2018-09-24 DIAGNOSIS — G4453 Primary thunderclap headache: Secondary | ICD-10-CM | POA: Diagnosis not present

## 2018-09-24 LAB — BASIC METABOLIC PANEL
Anion gap: 14 (ref 5–15)
BUN: 58 mg/dL — AB (ref 8–23)
CHLORIDE: 100 mmol/L (ref 98–111)
CO2: 24 mmol/L (ref 22–32)
CREATININE: 8.93 mg/dL — AB (ref 0.44–1.00)
Calcium: 8.4 mg/dL — ABNORMAL LOW (ref 8.9–10.3)
GFR calc Af Amer: 5 mL/min — ABNORMAL LOW (ref >60)
GFR calc non Af Amer: 4 mL/min — ABNORMAL LOW (ref >60)
Glucose, Bld: 115 mg/dL — ABNORMAL HIGH (ref 70–99)
Potassium: 4.1 mmol/L (ref 3.5–5.1)
Sodium: 138 mmol/L (ref 135–145)

## 2018-09-24 LAB — CBC
HCT: 31.4 % — ABNORMAL LOW (ref 36.0–46.0)
Hemoglobin: 9.2 g/dL — ABNORMAL LOW (ref 12.0–15.0)
MCH: 28.1 pg (ref 26.0–34.0)
MCHC: 29.3 g/dL — ABNORMAL LOW (ref 30.0–36.0)
MCV: 96 fL (ref 80.0–100.0)
Platelets: 249 10*3/uL (ref 150–400)
RBC: 3.27 MIL/uL — ABNORMAL LOW (ref 3.87–5.11)
RDW: 16.2 % — ABNORMAL HIGH (ref 11.5–15.5)
WBC: 6.4 10*3/uL (ref 4.0–10.5)
nRBC: 0 % (ref 0.0–0.2)

## 2018-09-24 LAB — TROPONIN I: Troponin I: <0.03 ng/mL (ref <0.03)

## 2018-09-24 NOTE — ED Triage Notes (Addendum)
Pt arrives to ED via POV from home with c/o weakness, headache, and SHOB. Pt states HA x3 days; SHOB and weakness since this afternoon. Pt denies N/V, no fever. Pt also reports non-productive cough "for years". No medications taken for s/x's PTA. Pt is A&O, in NAD; RR even, regular, and unlabored. Pt currently drinking a milkshake from  [employer]. Encouraged pt not to eat or drink anything further until seen by EDP, especially frozen drinks (like a milkshake) which c/o further aggravate her HA.

## 2018-09-25 ENCOUNTER — Encounter: Payer: Self-pay | Admitting: Radiology

## 2018-09-25 ENCOUNTER — Emergency Department
Admission: EM | Admit: 2018-09-25 | Discharge: 2018-09-25 | Disposition: A | Discharge status: Home / Self Care | Payer: Medicare Other | Attending: Emergency Medicine | Admitting: Emergency Medicine

## 2018-09-25 ENCOUNTER — Emergency Department: Payer: Medicare Other

## 2018-09-25 DIAGNOSIS — J810 Acute pulmonary edema: Secondary | ICD-10-CM | POA: Diagnosis not present

## 2018-09-25 DIAGNOSIS — G4453 Primary thunderclap headache: Secondary | ICD-10-CM

## 2018-09-25 MED ORDER — MIDAZOLAM HCL 2 MG/2ML IJ SOLN: 2.0000 mg | Freq: Once | INTRAMUSCULAR | Status: DC

## 2018-09-25 MED ORDER — IOHEXOL 350 MG/ML SOLN
75.0000 mL | Freq: Once | INTRAVENOUS | Status: AC | PRN
  Administered 2018-09-25: 100 mL via INTRAVENOUS

## 2018-09-25 MED ORDER — ONDANSETRON HCL 4 MG/2ML IJ SOLN
4.0000 mg | Freq: Once | INTRAMUSCULAR | Status: AC
  Administered 2018-09-25: 4 mg via INTRAVENOUS
  Filled 2018-09-25: qty 2

## 2018-09-25 MED ORDER — DIPHENHYDRAMINE HCL 50 MG/ML IJ SOLN
25.0000 mg | Freq: Once | INTRAMUSCULAR | Status: AC
  Administered 2018-09-25: 25 mg via INTRAVENOUS
  Filled 2018-09-25: qty 1

## 2018-09-25 MED ORDER — DIPHENHYDRAMINE HCL 50 MG/ML IJ SOLN
50.0000 mg | Freq: Once | INTRAMUSCULAR | Status: DC
  Filled 2018-09-25: qty 1

## 2018-09-25 MED ORDER — FUROSEMIDE 10 MG/ML IJ SOLN
80.0000 mg | Freq: Once | INTRAMUSCULAR | Status: AC
  Administered 2018-09-25: 80 mg via INTRAVENOUS
  Filled 2018-09-25: qty 8

## 2018-09-25 MED ORDER — PROCHLORPERAZINE EDISYLATE 10 MG/2ML IJ SOLN
10.0000 mg | Freq: Once | INTRAMUSCULAR | Status: AC
  Administered 2018-09-25: 10 mg via INTRAVENOUS
  Filled 2018-09-25: qty 2

## 2018-09-25 MED ORDER — IPRATROPIUM-ALBUTEROL 0.5-2.5 (3) MG/3ML IN SOLN
3.0000 mL | Freq: Once | RESPIRATORY_TRACT | Status: AC
  Administered 2018-09-25: 3 mL via RESPIRATORY_TRACT
  Filled 2018-09-25: qty 3

## 2018-09-25 NOTE — ED Provider Notes (Signed)
[facility] Emergency Department Provider Note  ____________________________________________   First MD Initiated Contact with Patient 09/25/18 [phone]     (approximate)  I have reviewed the triage vital signs and the nursing notes.   HISTORY  Chief Complaint Headache and Shortness of Breath   HPI Kimberly Shaffer is a 68 y.o. female who self presents to the emergency department with 3 days of sudden onset maximal onset bifrontal throbbing headache that is similar to previous headaches although more intense.  She has a complex past medical history including coronary artery disease with a recent cardiac catheterization as below.  She also has a past medical history of chronic headaches, depression, hypertension, as well as end-stage renal disease.  She most recently had dialysis yesterday and is due tomorrow morning.  Her headache came on suddenly has been constant associated with nausea.  No numbness or weakness.  Nothing in particular seems to make it better or worse.    05/30/2018 Cath:  Colon Flattery LAD to Mid LAD lesion is 20% stenosed.  Dist LAD-1 lesion is 70% stenosed.  Dist LAD-2 lesion is 65% stenosed.  Ost 2nd Diag to 2nd Diag lesion is 45% stenosed.  Ramus lesion is 75% stenosed.  Ost Cx to Dist Cx lesion is 20% stenosed.  Dist Cx lesion is 30% stenosed.  Prox RCA lesion is 100% stenosed.  Dist RCA lesion is 100% stenosed.  LIMA.  Prox Graft lesion is 100% stenosed.  Ost 1st Mrg lesion is 70% stenosed.  Mid LAD lesion is 85% stenosed.   Assessment The patient has had an acute non-ST elevation myocardial infarction with causes and risk factors including diabetes, high blood pressure and high cholesterol.  Left ventricular angiogram shows abnormal LV systolic function ejection fraction of 40% there is hypokinesis of the inferior and inferoapical walls  severe 3 vessel coronary artery disease There is occlusion of the right coronary artery  with collateralization unchanged from before There is stenting of the proximal left anterior descending artery patent and unchanged There is new significant mid LAD stenosis of 85% possibly culprit lesion There is a patent stent of the proximal left circumflex artery unchanged from before with possible slight increase in stenosis of ostial obtuse marginal 1   Past Medical History:  Diagnosis Date  . Asthma   . CAD (coronary artery disease)   . Chronic lower back pain   . COPD (chronic obstructive pulmonary disease) ([place])   . Depression   . Diabetes mellitus without complication (HCC)    NIDD  . H/O angina pectoris   . H/O blood clots   . Hypercholesteremia   . Hypertension   . Left rotator cuff tear   . Myocardial infarction ([place])   . Obesity   . Renal insufficiency   . Stroke ([place])   . Vertigo, aural     Patient Active Problem List   Diagnosis Date Noted  . Acute on chronic respiratory failure with hypoxemia ([place]) 06/19/2018  . Acute respiratory failure ([place]) 06/19/2018  . Acute on chronic congestive heart failure ([place])   . ESRD on hemodialysis ([place])   . Palliative care by specialist   . Acute respiratory failure with hypoxemia ([place]) 04/18/2018  . Anemia of chronic disease 03/24/2018  . NSTEMI (non-ST elevated myocardial infarction) ([place]) 03/23/2018  . Acute on chronic diastolic CHF (congestive heart failure) ([place]) 03/02/2018  . GERD (gastroesophageal reflux disease) 03/02/2018  . Respiratory failure ([place]) 01/11/2018  . Abdominal pain 06/28/2017  . Anxiety, generalized  06/27/2017  . COPD (chronic obstructive pulmonary disease) ([place]) 06/26/2017  . Peritonitis ([place]) 05/28/2017  . Bilateral carotid artery stenosis 03/08/2017  . ESRD on dialysis ([place]) 03/08/2017  . Non-ST elevation myocardial infarction (NSTEMI), subendocardial infarction, subsequent episode of care ([place]) 11/04/2016  . Onychogryphosis 10/31/2016  . Onychomycosis 10/31/2016  . Subungual exostosis  10/31/2016  . Type 2 diabetes mellitus with diabetic neuropathy ([place]) 10/31/2016  . Chronic osteomyelitis of left hand ([place]) 09/03/2016  . Left arm swelling 08/30/2016  . Pre-operative clearance 08/30/2016  . Coronary artery disease of native artery of native heart with stable angina pectoris ([place]) 08/03/2016  . Benign paroxysmal positional vertigo due to bilateral vestibular disorder 07/26/2016  . H/O: CVA (cerebrovascular accident) 07/26/2016  . Stable angina ([place]) 07/05/2016  . Sepsis due to pneumonia ([place]) 07/05/2016  . Fracture of left foot 04/26/2016  . Pre-transplant evaluation for kidney transplant 02/04/2016  . Chest pain at rest 12/17/2015  . Chest pain 12/17/2015  . DJD of shoulder 10/21/2015  . Perianal lesion 05/20/2015  . 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 Lumbar radiculopathy 05/14/2015  . 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 Benign essential hypertension 04/22/2015  . Grief 03/18/2015  . Lumbosacral facet joint syndrome 02/16/2015  . Greater trochanteric bursitis 02/16/2015  . DDD (degenerative disc disease), lumbosacral 01/20/2015  . 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 Osteoarthritis 01/20/2015  . IDA (iron deficiency anemia) 03/25/2014  . Thyroid nodule 01/15/2014  . Lung nodule 08/28/2013  . Tobacco abuse 05/17/2013  . Mixed hyperlipidemia 06/14/2011  . OSA on CPAP 06/14/2011  . PAD (peripheral artery disease) ([place]) 06/14/2011  . Depression 05/05/2011  . Chronic kidney disease (CKD), stage V ([place]) 03/26/2011  . Multiple vessel coronary artery disease 03/26/2011  . Obesity, unspecified 03/26/2011  . Type 2 diabetes mellitus ([place]) 03/26/2011    Past Surgical History:  Procedure Laterality Date  . A/V FISTULAGRAM Left 03/14/2017   Procedure: A/V Fistulagram;  Surgeon: Katha Cabal, MD;  Location: [place] [facility];  Service: Cardiovascular;  Laterality: Left;  . AV FISTULA PLACEMENT Left  11-04-2015  . AV FISTULA PLACEMENT Left 11/04/2015   Procedure: ARTERIOVENOUS (AV) FISTULA CREATION  ( BRACHIAL CEPHALIC );  Surgeon: Katha Cabal, MD;  Location: [facility];  Service: Vascular;  Laterality: Left;  . CARDIAC CATHETERIZATION    . CARDIAC CATHETERIZATION Left 07/19/2016   Procedure: Left Heart Cath and Coronary Angiography;  Surgeon: Corey Skains, MD;  Location: [place] [facility];  Service: Cardiovascular;  Laterality: Left;  . CORONARY ARTERY BYPASS GRAFT  2012  . CORONARY STENT PLACEMENT    . JOINT REPLACEMENT    . KNEE SURGERY Bilateral   . LEFT HEART CATH AND CORONARY ANGIOGRAPHY N/A 12/25/2017   Procedure: LEFT HEART CATH AND CORONARY ANGIOGRAPHY;  Surgeon: Teodoro Spray, MD;  Location: [place] [facility];  Service: Cardiovascular;  Laterality: N/A;  . LEFT HEART CATH AND CORONARY ANGIOGRAPHY Right 03/23/2018   Procedure: LEFT HEART CATH AND CORONARY ANGIOGRAPHY;  Surgeon: Dionisio David, MD;  Location: [place] [facility];  Service: Cardiovascular;  Laterality: Right;  . LEFT HEART CATH AND CORONARY ANGIOGRAPHY N/A 05/30/2018   Procedure: LEFT HEART CATH AND CORONARY ANGIOGRAPHY;  Surgeon: Corey Skains, MD;  Location: [place] [facility];  Service: Cardiovascular;  Laterality: N/A;  . PERIPHERAL VASCULAR CATHETERIZATION N/A 11/13/2015   Procedure: Dialysis/Perma Catheter Insertion;  Surgeon: Katha Cabal, MD;  Location: [facility];  Service: Cardiovascular;  Laterality: N/A;  . PERIPHERAL VASCULAR CATHETERIZATION N/A 12/04/2015   Procedure: Dialysis/Perma Catheter Insertion;  Surgeon: Katha Cabal, MD;  Location: [place] [facility];  Service: Cardiovascular;  Laterality: N/A;  . PERIPHERAL VASCULAR CATHETERIZATION Left 12/31/2015   Procedure: A/V Shuntogram/Fistulagram;  Surgeon: Algernon Huxley, MD;  Location: [place] [facility];  Service: Cardiovascular;  Laterality: Left;  . PERIPHERAL VASCULAR CATHETERIZATION N/A 12/31/2015   Procedure:  A/V Shunt Intervention;  Surgeon: Algernon Huxley, MD;  Location: [place] [facility];  Service: Cardiovascular;  Laterality: N/A;  . PERIPHERAL VASCULAR CATHETERIZATION Left 02/25/2016   Procedure: A/V Shuntogram/Fistulagram;  Surgeon: Algernon Huxley, MD;  Location: [place] [facility];  Service: Cardiovascular;  Laterality: Left;  . PERIPHERAL VASCULAR CATHETERIZATION N/A 03/18/2016   Procedure: Dialysis/Perma Catheter Removal;  Surgeon: Katha Cabal, MD;  Location: [place] [facility];  Service: Cardiovascular;  Laterality: N/A;  . REMOVAL OF A DIALYSIS CATHETER N/A 06/30/2017   Procedure: REMOVAL OF A DIALYSIS CATHETER;  Surgeon: Katha Cabal, MD;  Location: [facility];  Service: Vascular;  Laterality: N/A;  . REPLACEMENT TOTAL KNEE BILATERAL Bilateral     Prior to Admission medications   Medication Sig Start Date End Date Taking? Authorizing Provider  acetaminophen (TYLENOL) 325 MG tablet Take 2 tablets (650 mg total) by mouth every 6 (six) hours as needed for mild pain (or Fever >/= 101). 05/30/18   Awilda Bill, NP  albuterol (PROVENTIL) (2.5 MG/3ML) 0.083% nebulizer solution Take 3 mLs (2.5 mg total) by nebulization every 4 (four) hours as needed for wheezing or shortness of breath. 05/30/18   Awilda Bill, NP  alteplase (CATHFLO ACTIVASE) 2 MG injection 2 mg by Intracatheter route once as needed for open catheter. 05/30/18   Awilda Bill, NP  amLODipine (NORVASC) 10 MG tablet Take 10 mg by mouth daily.    [provider]  aspirin 81 MG chewable tablet Chew 81 mg by mouth daily.    [provider]  atorvastatin (LIPITOR) 40 MG tablet Take 40 mg by mouth at bedtime. 03/28/18   [provider]  budesonide-formoterol (SYMBICORT) 160-4.5 MCG/ACT inhaler Inhale 2 puffs into the lungs 2 (two) times daily.    [provider]  calcium carbonate (TUMS - DOSED IN MG ELEMENTAL CALCIUM) 500 MG chewable tablet Chew 2 tablets by mouth 3 (three) times daily.     [provider]  cetirizine (ZYRTEC) 10 MG tablet Take 10 mg by mouth daily.    [provider]  Chlorhexidine Gluconate Cloth 2 % PADS Apply 6 each topically daily at 6 (six) AM. 05/31/18   Awilda Bill, NP  cinacalcet (SENSIPAR) 30 MG tablet Take 1 tablet (30 mg total) by mouth daily before breakfast. 05/31/18   Awilda Bill, NP  clopidogrel (PLAVIX) 75 MG tablet Take 1 tablet (75 mg total) by mouth daily. 05/31/18   Awilda Bill, NP  docusate sodium (COLACE) 100 MG capsule Take 1 capsule (100 mg total) by mouth 2 (two) times daily. 05/30/18   Awilda Bill, NP  fluticasone (FLONASE) 50 MCG/ACT nasal spray Place 2 sprays into both nostrils 2 (two) times daily.    [provider]  fluticasone furoate-vilanterol (BREO ELLIPTA) 200-25 MCG/INH AEPB Inhale 1 puff into the lungs daily. 05/31/18   Awilda Bill, NP  folic acid (FOLVITE) 1 MG tablet Take 1 mg by mouth daily.    [provider]  heparin 100-0.45 UNIT/ML-% infusion Inject 1,100 Units/hr into the vein continuous. Patient not taking: Reported on 06/19/2018 05/30/18   Awilda Bill, NP  heparin 1000 unit/mL SOLN injection 1 mL (1,000 Units total) by Dialysis route as needed (in dialysis). 05/30/18   Awilda Bill, NP  isosorbide mononitrate (IMDUR) 30 MG 24 hr tablet Take 30 mg by mouth daily.    [provider]  lidocaine (LIDODERM) 5 % Place 1 patch onto the skin daily. Remove & Discard patch within 12 hours or as directed by MD Patient not taking: Reported on 06/19/2018 05/31/18   Awilda Bill, NP  lidocaine, PF, (XYLOCAINE) 1 % SOLN injection Inject 5 mLs into the skin as needed (topical anesthesia for hemodialysis ifGEBAUERS is ineffective.). 05/30/18   Awilda Bill, NP  lidocaine-prilocaine (EMLA) cream Apply 1 application topically as needed (topical anesthesia for hemodialysis if Gebauers and Lidocaine injection are ineffective.). 05/30/18   Awilda Bill, NP    lubiprostone (AMITIZA) 24 MCG capsule Take 24 mcg by mouth 2 (two) times daily with a meal.    [provider]  meclizine (ANTIVERT) 25 MG tablet Take 25 mg by mouth 3 (three) times daily as needed for dizziness.    [provider]  metoprolol succinate (TOPROL-XL) 100 MG 24 hr tablet Take 1 tablet (100 mg total) by mouth daily with breakfast. Take with or immediately following a meal. 05/31/18   Awilda Bill, NP  multivitamin (RENA-VIT) TABS tablet Take 1 tablet by mouth at bedtime. 05/30/18   Awilda Bill, NP  nitroGLYCERIN (NITROSTAT) 0.4 MG SL tablet Place 1 tablet (0.4 mg total) under the tongue every 5 (five) minutes as needed for chest pain. 05/30/18   Awilda Bill, NP  Nutritional Supplements (FEEDING SUPPLEMENT, NEPRO CARB STEADY,) LIQD Take 237 mLs by mouth daily. 05/31/18   Awilda Bill, NP  nystatin (MYCOSTATIN) 100000 UNIT/ML suspension Use as directed 5 mLs in the mouth or throat 4 (four) times daily.    [provider]  ondansetron (ZOFRAN) 4 MG tablet Take 1 tablet (4 mg total) by mouth every 6 (six) hours as needed for nausea. Patient not taking: Reported on 06/19/2018 05/30/18   Awilda Bill, NP  ondansetron [facility]) 4 MG/2ML SOLN injection Inject 2 mLs (4 mg total) into the vein every 6 (six) hours as needed for nausea. Patient not taking: Reported on 06/19/2018 05/30/18   Awilda Bill, NP  Oxycodone HCl 10 MG TABS Take 10 mg by mouth every 6 (six) hours as needed (pain).    [provider]  pantoprazole (PROTONIX) 40 MG tablet Take 40 mg by mouth 2 (two) times daily.    [provider]  pentafluoroprop-tetrafluoroeth [place]) AERO Apply 1 application topically as needed (topical anesthesia for hemodialysis). 05/30/18   Awilda Bill, NP  polyethylene glycol (MIRALAX / GLYCOLAX) packet Take 17 g by mouth daily. Patient not taking: Reported on 06/19/2018 05/31/18   Awilda Bill, NP  ranolazine (RANEXA) 500 MG 12 hr  tablet Take 500 mg by mouth 2 (two) times daily. 04/13/18   [provider]  sevelamer carbonate (RENVELA) 800 MG tablet Take 1,600 mg by mouth 3 (three) times daily with meals.    [provider]  sodium chloride 0.9 % infusion Inject 100 mLs into the vein as needed (symptomatic hypotension or decrease in SBP < 90 mmHg). 05/30/18   Awilda Bill, NP  sodium chloride HYPERTONIC 3 % nebulizer solution Take  3 mLs by nebulization as needed for other.     [provider]  sodium zirconium cyclosilicate (LOKELMA) 5 g packet Take 10 g by mouth 3 (three) times daily. Patient not taking: Reported on 06/19/2018 05/30/18   Awilda Bill, NP  tiotropium (SPIRIVA HANDIHALER) 18 MCG inhalation capsule Place 1 capsule (18 mcg total) into inhaler and inhale daily. 05/31/18   Awilda Bill, NP  torsemide (DEMADEX) 100 MG tablet Take 100 mg by mouth daily.    [provider]  varenicline (CHANTIX) 0.5 MG tablet Take 1 tablet (0.5 mg total) by mouth daily. 05/30/18   Awilda Bill, NP  venlafaxine XR (EFFEXOR-XR) 37.5 MG 24 hr capsule Take 37.5 mg by mouth daily with breakfast.    [provider]    Allergies Nystatin; Sulfa antibiotics; and Niaspan [niacin]  Family History  Problem Relation Age of Onset  . Cancer Mother   . Cancer Father   . Diabetes Brother   . Heart disease Brother     Social History Social History   Tobacco Use  . Smoking status: Current Every Day Smoker    Packs/day: 0.25    Types: Cigarettes  . Smokeless tobacco: Never Used  Substance Use Topics  . Alcohol use: No    Alcohol/week: 0.0 standard drinks  . Drug use: No    Review of Systems Constitutional: No fever/chills Eyes: No visual changes. ENT: No sore throat. Cardiovascular: Denies chest pain. Respiratory: Denies shortness of breath. Gastrointestinal: No abdominal pain.  No nausea, no vomiting.  No diarrhea.  No constipation. Genitourinary: Negative for  dysuria. Musculoskeletal: Negative for back pain. Skin: Negative for rash. Neurological: Positive for headache   ____________________________________________   PHYSICAL EXAM:  VITAL SIGNS: ED Triage Vitals  Enc Vitals Group     BP 09/24/18 2138 (!) 155/57     Pulse Rate 09/24/18 2138 70     Resp 09/24/18 2138 18     Temp 09/24/18 2138 98 F (36.7 C)     Temp Source 09/24/18 2138 Oral     SpO2 09/24/18 2138 98 %     Weight 09/24/18 2134 222 lb (100.7 kg)     Height 09/24/18 2134 5\' 6"  (1.676 m)     Head Circumference --      Peak Flow --      Pain Score 09/24/18 2134 10     Pain Loc --      Pain Edu? --      Excl. in [place]? --     Constitutional: Alert and oriented x4 appears obviously uncomfortable in a darkened room although nontoxic in no diaphoresis Eyes: PERRL EOMI. midrange and brisk Head: Atraumatic. Nose: No congestion/rhinnorhea. Mouth/Throat: No trismus no meningismus Neck: No stridor.  Able to lie completely flat although significant JVD Cardiovascular: Normal rate, regular rhythm. Grossly normal heart sounds.  Good peripheral circulation. Respiratory: Slightly increased respiratory effort with crackles at bilateral bases Gastrointestinal: Soft nontender Musculoskeletal: No lower extremity edema   Neurologic:  Normal speech and language. No gross focal neurologic deficits are appreciated. Skin:  Skin is warm, dry and intact. No rash noted. Psychiatric: Mood and affect are normal. Speech and behavior are normal.    ____________________________________________   DIFFERENTIAL includes but not limited to  Subarachnoid hemorrhage, vertebral artery dissection, intracerebral mass, migraine headache, pulmonary edema ____________________________________________   LABS (all labs ordered are listed, but only abnormal results are displayed)  Labs Reviewed  BASIC METABOLIC PANEL - Abnormal; Notable for the  following components:      Result Value   Glucose, Bld  115 (*)    BUN 58 (*)    Creatinine, Ser 8.93 (*)    Calcium 8.4 (*)    GFR calc non Af Amer 4 (*)    GFR calc Af Amer 5 (*)    All other components within normal limits  CBC - Abnormal; Notable for the following components:   RBC 3.27 (*)    Hemoglobin 9.2 (*)    HCT 31.4 (*)    MCHC 29.3 (*)    RDW 16.2 (*)    All other components within normal limits  TROPONIN I    Lab work reviewed by me with elevated creatinine although the patient is a dialysis patient.  She has no indication for emergent dialysis __________________________________________  EKG  ED ECG REPORT I, Darel Hong, the attending physician, personally viewed and interpreted this ECG.  Date: 09/27/2018 EKG Time:  Rate: 72 Rhythm: normal sinus rhythm QRS Axis: Leftward axis Intervals: normal ST/T Wave abnormalities: High lateral and lateral T wave inversion which is similar to previous EKG 06/19/2018 Narrative Interpretation: no evidence of acute ischemia  ____________________________________________  RADIOLOGY  Chest x-ray reviewed by me shows slight pulmonary edema ET angiogram of the head neck reviewed by me with no acute disease ____________________________________________   PROCEDURES  Procedure(s) performed: no  Procedures  Critical Care performed: no  ____________________________________________   INITIAL IMPRESSION / ASSESSMENT AND PLAN / ED COURSE  Pertinent labs & imaging results that were available during my care of the patient were reviewed by me and considered in my medical decision making (see chart for details).   As part of my medical decision making, I reviewed the following data within the [place] History obtained from family if available, nursing notes, old chart and ekg, as well as notes from prior ED visits.  The patient comes to the emergency department with a thunderclap headache 3 days ago.  She is outside of the window to evaluate with a simple  head CT so in addition to pain medication with Compazine and Benadryl the patient will require a CT scan of her head neck with IV contrast to evaluate for possible aneurysm.  She does have a touch of pulmonary edema and she does make urine so we will give her 80 mg of Lasix in the meantime.  Fortunately the patient's CT scans are reassuring and her headache is essentially resolved.  She does have a touch of pulmonary edema however has follow-up with dialysis this morning.  I will discharge her home with reassurance.  Strict return cautions been given.      ____________________________________________   FINAL CLINICAL IMPRESSION(S) / ED DIAGNOSES  Final diagnoses:  Acute pulmonary edema (HCC)  Thunderclap headache      NEW MEDICATIONS STARTED DURING THIS VISIT:  Discharge Medication List as of 09/25/2018  5:46 AM       Note:  This document was prepared using Dragon voice recognition software and may include unintentional dictation errors.    Darel Hong, MD 09/27/18 [phone]

## 2018-09-25 NOTE — ED Notes (Signed)
Patient transported to CT 

## 2018-09-25 NOTE — Discharge Instructions (Signed)
Fortunately today your CT scan of your head and your neck were very reassuring.  Please make sure you follow-up with your primary care physician and your neurologist as scheduled regarding your headaches.  Please make sure you go to dialysis today as your chest x-ray showed you had a little bit too much fluid and it needs to come off.  Return to the emergency department for any concerns.  It was a pleasure to take care of you today, and thank you for coming to our emergency department.  If you have any questions or concerns before leaving please ask the nurse to grab me and I'm more than happy to go through your aftercare instructions again.  If you have any concerns once you are home that you are not improving or are in fact getting worse before you can make it to your follow-up appointment, please do not hesitate to call 911 and come back for further evaluation.  Darel Hong, MD  While here in the ER today you received very powerful medicine that makes it unsafe for you to drive for the rest of the day.  Do not drive until tomorrow.   Results for orders placed or performed during the hospital encounter of 53/74/82  Basic metabolic panel  Result Value Ref Range   Sodium 138 135 - 145 mmol/L   Potassium 4.1 3.5 - 5.1 mmol/L   Chloride 100 98 - 111 mmol/L   CO2 24 22 - 32 mmol/L   Glucose, Bld 115 (H) 70 - 99 mg/dL   BUN 58 (H) 8 - 23 mg/dL   Creatinine, Ser 8.93 (H) 0.44 - 1.00 mg/dL   Calcium 8.4 (L) 8.9 - 10.3 mg/dL   GFR calc non Af Amer 4 (L) >60 mL/min   GFR calc Af Amer 5 (L) >60 mL/min   Anion gap 14 5 - 15  CBC  Result Value Ref Range   WBC 6.4 4.0 - 10.5 K/uL   RBC 3.27 (L) 3.87 - 5.11 MIL/uL   Hemoglobin 9.2 (L) 12.0 - 15.0 g/dL   HCT 31.4 (L) 36.0 - 46.0 %   MCV 96.0 80.0 - 100.0 fL   MCH 28.1 26.0 - 34.0 pg   MCHC 29.3 (L) 30.0 - 36.0 g/dL   RDW 16.2 (H) 11.5 - 15.5 %   Platelets 249 150 - 400 K/uL   nRBC 0.0 0.0 - 0.2 %  Troponin I - ONCE - STAT  Result Value Ref  Range   Troponin I <0.03 <0.03 ng/mL   Ct Angio Head W Or Wo Contrast  Result Date: 09/25/2018 CLINICAL DATA:  Initial evaluation for acute weakness and headache. EXAM: CT ANGIOGRAPHY HEAD AND NECK TECHNIQUE: Multidetector CT imaging of the head and neck was performed using the standard protocol during bolus administration of intravenous contrast. Multiplanar CT image reconstructions and MIPs were obtained to evaluate the vascular anatomy. Carotid stenosis measurements (when applicable) are obtained utilizing NASCET criteria, using the distal internal carotid diameter as the denominator. CONTRAST:  132mL OMNIPAQUE IOHEXOL 350 MG/ML SOLN COMPARISON:  Prior CT from 07/02/2015. FINDINGS: CT HEAD FINDINGS Brain: Examination degraded by motion artifact. Age-related cerebral atrophy with moderate to advanced chronic microvascular disease. Small remote lacunar infarcts noted within the bilateral basal ganglia, thalami, and pons. No acute intracranial hemorrhage. No acute large vessel territory infarct. No mass lesion, midline shift or mass effect. No hydrocephalus. No extra-axial fluid collection. Vascular: No hyperdense vessel. Scattered vascular calcifications noted within the carotid siphons. Skull: Scalp soft  tissues and calvarium demonstrate no acute finding. Sinuses: Paranasal sinuses and mastoid air cells are clear. Orbits: Globes orbital soft tissues demonstrate no acute finding. Review of the MIP images confirms the above findings CTA NECK FINDINGS Aortic arch: Visualized aortic arch within normal limits for caliber with normal branch pattern. Moderate to advanced atherosclerotic plaque about the arch and origin of the great vessels without hemodynamically significant stenosis. Visualized subclavian arteries widely patent. Right carotid system: Right common carotid artery patent from its origin to the bifurcation without flow-limiting stenosis. Scattered calcified plaque about the right bifurcation/proximal  right ICA. There is associated stenosis at the proximal right ICA of up to approximately 70% by NASCET criteria (series 8, image 145). Right ICA patent distally to the skull base without additional stenosis, dissection, or occlusion. Left carotid system: Left common carotid artery patent from its origin to the bifurcation without stenosis. Scattered calcified plaque about the left bifurcation/proximal left ICA with associated stenosis of up to 70% by NASCET criteria. Exact delineation somewhat difficult due to motion artifact. Left ICA mildly tortuous but widely patent distally to the skull base without stenosis, dissection, or occlusion. Vertebral arteries: Both of the vertebral arteries arise from the subclavian arteries. Left vertebral artery likely dominant. Focal plaque at the origin of the left vertebral artery with approximately 50-70% stenosis. Distally left vertebral artery patent within the neck without additional flow-limiting stenosis, dissection, or occlusion. 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 Opacification of the right V4 and distal V3 segments likely via retrograde flow cross the vertebrobasilar junction. Skeleton: No acute osseous finding. No discrete lytic or blastic osseous lesions. Moderate multilevel spondylolysis noted within the visualized spine. Other neck: No other acute soft tissue abnormality within the neck. Salivary glands within normal limits. No adenopathy. Multiple nodules noted within the thyroid, largest of which on the right and measures up to approximately 2 cm. Upper chest: Partially visualized upper chest demonstrates no acute finding. Scattered atelectatic changes noted throughout the visualized lungs. Review of the MIP images confirms the above findings CTA HEAD FINDINGS Anterior circulation: Petrous segments patent bilaterally. Prominent atherosclerotic plaque throughout the cavernous/supraclinoid ICAs  bilaterally. Resultant severe stenoses bilaterally measuring up to approximately 80%, slightly worse on the right. ICA termini well perfused. A1 segments patent bilaterally. Right A1 mildly hypoplastic. Normal anterior communicating artery. Anterior cerebral arteries patent to their distal aspects without occlusion or stenosis. No M1 stenosis or occlusion. No proximal M2 occlusion. Distal MCA branches well perfused and symmetric. Posterior circulation: Scattered atheromatous plaque seen throughout the dominant left V4 segment with mild multifocal narrowing. Retrograde filling of the hypoplastic right V4 segment via the vertebrobasilar junction. Left PICA patent proximally. Right PICA not well seen. Basilar patent to its distal aspect without stenosis. Superior cerebral arteries patent bilaterally. Both of the posterior cerebral arteries primarily supplied via the basilar and are patent to their distal aspects without stenosis. Venous sinuses: Patent. Anatomic variants: None significant. Delayed phase: No abnormal enhancement. Vague hyperdensity overlying the anterior frontal lobes bilaterally favored be related to motion artifact. Review of the MIP images confirms the above findings IMPRESSION: CT HEAD IMPRESSION 1. No acute intracranial abnormality. 2. Generalized age-related cerebral atrophy with advanced chronic microvascular ischemic disease. CTA HEAD AND NECK IMPRESSION 1. Negative CTA for large vessel occlusion. 2. 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 3. 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 4. Approximate 50-70% stenosis  at the origin of the dominant left vertebral artery. 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 Electronically Signed   By: Jeannine Boga M.D.   On: 09/25/2018 05:38   Dg Chest 2 View  Result Date: 09/24/2018 CLINICAL DATA:  Shortness of Breath  EXAM: CHEST - 2 VIEW COMPARISON:  June 19, 2018 FINDINGS: There is mild interstitial edema. There is no appreciable airspace consolidation. There is cardiomegaly with mild pulmonary venous hypertension. No evident adenopathy. There is aortic atherosclerosis. There is degenerative change in the thoracic spine. Calcification is noted in the lateral aspect of the left shoulder, likely representing calcific tendinosis. IMPRESSION: There is a degree of pulmonary vascular congestion with interstitial edema. No frank consolidation. There is aortic atherosclerosis. Aortic Atherosclerosis (ICD10-I70.0). Electronically Signed   By: Lowella Grip III M.D.   On: 09/24/2018 21:56   Ct Angio Neck W And/or Wo Contrast  Result Date: 09/25/2018 CLINICAL DATA:  Initial evaluation for acute weakness and headache. EXAM: CT ANGIOGRAPHY HEAD AND NECK TECHNIQUE: Multidetector CT imaging of the head and neck was performed using the standard protocol during bolus administration of intravenous contrast. Multiplanar CT image reconstructions and MIPs were obtained to evaluate the vascular anatomy. Carotid stenosis measurements (when applicable) are obtained utilizing NASCET criteria, using the distal internal carotid diameter as the denominator. CONTRAST:  179mL OMNIPAQUE IOHEXOL 350 MG/ML SOLN COMPARISON:  Prior CT from 07/02/2015. FINDINGS: CT HEAD FINDINGS Brain: Examination degraded by motion artifact. Age-related cerebral atrophy with moderate to advanced chronic microvascular disease. Small remote lacunar infarcts noted within the bilateral basal ganglia, thalami, and pons. No acute intracranial hemorrhage. No acute large vessel territory infarct. No mass lesion, midline shift or mass effect. No hydrocephalus. No extra-axial fluid collection. Vascular: No hyperdense vessel. Scattered vascular calcifications noted within the carotid siphons. Skull: Scalp soft tissues and calvarium demonstrate no acute finding. Sinuses: Paranasal  sinuses and mastoid air cells are clear. Orbits: Globes orbital soft tissues demonstrate no acute finding. Review of the MIP images confirms the above findings CTA NECK FINDINGS Aortic arch: Visualized aortic arch within normal limits for caliber with normal branch pattern. Moderate to advanced atherosclerotic plaque about the arch and origin of the great vessels without hemodynamically significant stenosis. Visualized subclavian arteries widely patent. Right carotid system: Right common carotid artery patent from its origin to the bifurcation without flow-limiting stenosis. Scattered calcified plaque about the right bifurcation/proximal right ICA. There is associated stenosis at the proximal right ICA of up to approximately 70% by NASCET criteria (series 8, image 145). Right ICA patent distally to the skull base without additional stenosis, dissection, or occlusion. Left carotid system: Left common carotid artery patent from its origin to the bifurcation without stenosis. Scattered calcified plaque about the left bifurcation/proximal left ICA with associated stenosis of up to 70% by NASCET criteria. Exact delineation somewhat difficult due to motion artifact. Left ICA mildly tortuous but widely patent distally to the skull base without stenosis, dissection, or occlusion. Vertebral arteries: Both of the vertebral arteries arise from the subclavian arteries. Left vertebral artery likely dominant. Focal plaque at the origin of the left vertebral artery with approximately 50-70% stenosis. Distally left vertebral artery patent within the neck without additional flow-limiting stenosis, dissection, or occlusion. 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 Opacification of the right V4 and distal V3 segments likely via retrograde flow cross the vertebrobasilar junction. Skeleton: No acute osseous finding. No discrete lytic or blastic osseous  lesions. Moderate  multilevel spondylolysis noted within the visualized spine. Other neck: No other acute soft tissue abnormality within the neck. Salivary glands within normal limits. No adenopathy. Multiple nodules noted within the thyroid, largest of which on the right and measures up to approximately 2 cm. Upper chest: Partially visualized upper chest demonstrates no acute finding. Scattered atelectatic changes noted throughout the visualized lungs. Review of the MIP images confirms the above findings CTA HEAD FINDINGS Anterior circulation: Petrous segments patent bilaterally. Prominent atherosclerotic plaque throughout the cavernous/supraclinoid ICAs bilaterally. Resultant severe stenoses bilaterally measuring up to approximately 80%, slightly worse on the right. ICA termini well perfused. A1 segments patent bilaterally. Right A1 mildly hypoplastic. Normal anterior communicating artery. Anterior cerebral arteries patent to their distal aspects without occlusion or stenosis. No M1 stenosis or occlusion. No proximal M2 occlusion. Distal MCA branches well perfused and symmetric. Posterior circulation: Scattered atheromatous plaque seen throughout the dominant left V4 segment with mild multifocal narrowing. Retrograde filling of the hypoplastic right V4 segment via the vertebrobasilar junction. Left PICA patent proximally. Right PICA not well seen. Basilar patent to its distal aspect without stenosis. Superior cerebral arteries patent bilaterally. Both of the posterior cerebral arteries primarily supplied via the basilar and are patent to their distal aspects without stenosis. Venous sinuses: Patent. Anatomic variants: None significant. Delayed phase: No abnormal enhancement. Vague hyperdensity overlying the anterior frontal lobes bilaterally favored be related to motion artifact. Review of the MIP images confirms the above findings IMPRESSION: CT HEAD IMPRESSION 1. No acute intracranial abnormality. 2. Generalized age-related  cerebral atrophy with advanced chronic microvascular ischemic disease. CTA HEAD AND NECK IMPRESSION 1. Negative CTA for large vessel occlusion. 2. 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 3. 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 4. Approximate 50-70% stenosis at the origin of the dominant left vertebral artery. 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 Electronically Signed   By: Jeannine Boga M.D.   On: 09/25/2018 05:38

## 2018-09-25 NOTE — ED Notes (Signed)
Pt off the floor in CT at this time.

## 2018-09-25 NOTE — ED Notes (Signed)
Pt unable to tolerate CT scan at this time due to nausea. MD aware

## 2018-09-25 NOTE — ED Notes (Signed)
Called from lobby to be taken to exam room. No reply.

## 2018-11-27 ENCOUNTER — Emergency Department: Payer: Medicare Other

## 2018-11-27 ENCOUNTER — Other Ambulatory Visit: Payer: Self-pay

## 2018-11-27 ENCOUNTER — Encounter: Payer: Self-pay | Admitting: Emergency Medicine

## 2018-11-27 ENCOUNTER — Emergency Department
Admission: EM | Admit: 2018-11-27 | Discharge: 2018-11-27 | Disposition: A | Discharge status: Home / Self Care | Payer: Medicare Other | Attending: Student in an Organized Health Care Education/Training Program | Admitting: Student in an Organized Health Care Education/Training Program

## 2018-11-27 DIAGNOSIS — Z992 Dependence on renal dialysis: Secondary | ICD-10-CM | POA: Insufficient documentation

## 2018-11-27 DIAGNOSIS — N186 End stage renal disease: Secondary | ICD-10-CM

## 2018-11-27 DIAGNOSIS — M6281 Muscle weakness (generalized): Secondary | ICD-10-CM | POA: Diagnosis not present

## 2018-11-27 DIAGNOSIS — Z79899 Other long term (current) drug therapy: Secondary | ICD-10-CM | POA: Insufficient documentation

## 2018-11-27 DIAGNOSIS — Z7901 Long term (current) use of anticoagulants: Secondary | ICD-10-CM | POA: Insufficient documentation

## 2018-11-27 DIAGNOSIS — J45909 Unspecified asthma, uncomplicated: Secondary | ICD-10-CM | POA: Diagnosis not present

## 2018-11-27 DIAGNOSIS — I2510 Atherosclerotic heart disease of native coronary artery without angina pectoris: Secondary | ICD-10-CM | POA: Insufficient documentation

## 2018-11-27 DIAGNOSIS — R531 Weakness: Secondary | ICD-10-CM | POA: Diagnosis present

## 2018-11-27 DIAGNOSIS — Z8673 Personal history of transient ischemic attack (TIA), and cerebral infarction without residual deficits: Secondary | ICD-10-CM | POA: Diagnosis not present

## 2018-11-27 DIAGNOSIS — E119 Type 2 diabetes mellitus without complications: Secondary | ICD-10-CM | POA: Diagnosis not present

## 2018-11-27 DIAGNOSIS — J449 Chronic obstructive pulmonary disease, unspecified: Secondary | ICD-10-CM | POA: Diagnosis not present

## 2018-11-27 DIAGNOSIS — F17210 Nicotine dependence, cigarettes, uncomplicated: Secondary | ICD-10-CM | POA: Diagnosis not present

## 2018-11-27 LAB — CBC WITH DIFFERENTIAL/PLATELET
Abs Immature Granulocytes: 0.02 10*3/uL (ref 0.00–0.07)
Basophils Absolute: 0 10*3/uL (ref 0.0–0.1)
Basophils Relative: 0 %
EOS ABS: 0.1 10*3/uL (ref 0.0–0.5)
EOS PCT: 1 %
HCT: 38.4 % (ref 36.0–46.0)
Hemoglobin: 11.9 g/dL — ABNORMAL LOW (ref 12.0–15.0)
Immature Granulocytes: 0 %
Lymphocytes Relative: 9 %
Lymphs Abs: 0.7 10*3/uL (ref 0.7–4.0)
MCH: 27.5 pg (ref 26.0–34.0)
MCHC: 31 g/dL (ref 30.0–36.0)
MCV: 88.7 fL (ref 80.0–100.0)
Monocytes Absolute: 0.7 10*3/uL (ref 0.1–1.0)
Monocytes Relative: 9 %
Neutro Abs: 6.2 10*3/uL (ref 1.7–7.7)
Neutrophils Relative %: 81 %
PLATELETS: 198 10*3/uL (ref 150–400)
RBC: 4.33 MIL/uL (ref 3.87–5.11)
RDW: 16.5 % — AB (ref 11.5–15.5)
WBC: 7.6 10*3/uL (ref 4.0–10.5)
nRBC: 0 % (ref 0.0–0.2)

## 2018-11-27 LAB — COMPREHENSIVE METABOLIC PANEL
ALT: 7 U/L (ref 0–44)
AST: 15 U/L (ref 15–41)
Albumin: 3.6 g/dL (ref 3.5–5.0)
Alkaline Phosphatase: 103 U/L (ref 38–126)
Anion gap: 14 (ref 5–15)
BUN: 19 mg/dL (ref 8–23)
CHLORIDE: 93 mmol/L — AB (ref 98–111)
CO2: 27 mmol/L (ref 22–32)
Calcium: 7.9 mg/dL — ABNORMAL LOW (ref 8.9–10.3)
Creatinine, Ser: 4.92 mg/dL — ABNORMAL HIGH (ref 0.44–1.00)
GFR calc non Af Amer: 8 mL/min — ABNORMAL LOW (ref >60)
GFR, EST AFRICAN AMERICAN: 10 mL/min — AB (ref >60)
Glucose, Bld: 114 mg/dL — ABNORMAL HIGH (ref 70–99)
Potassium: 3.3 mmol/L — ABNORMAL LOW (ref 3.5–5.1)
Sodium: 134 mmol/L — ABNORMAL LOW (ref 135–145)
Total Bilirubin: 0.7 mg/dL (ref 0.3–1.2)
Total Protein: 8.4 g/dL — ABNORMAL HIGH (ref 6.5–8.1)

## 2018-11-27 LAB — INFLUENZA PANEL BY PCR (TYPE A & B)
Influenza A By PCR: NEGATIVE
Influenza B By PCR: NEGATIVE

## 2018-11-27 LAB — TROPONIN I
Troponin I: 0.04 ng/mL (ref <0.03)
Troponin I: 0.03 ng/mL (ref <0.03)

## 2018-11-27 LAB — BRAIN NATRIURETIC PEPTIDE: B NATRIURETIC PEPTIDE 5: 588 pg/mL — AB (ref 0.0–100.0)

## 2018-11-27 NOTE — ED Notes (Signed)
Pt provided sandwich and juice Dr. Quentin Cornwall aware of trop

## 2018-11-27 NOTE — ED Provider Notes (Signed)
[facility] Emergency Department Provider Note    First MD Initiated Contact with Patient 11/27/18 1501     (approximate)  I have reviewed the triage vital signs and the nursing notes.   HISTORY  Chief Complaint Weakness    HPI Kimberly Shaffer is a 68 y.o. female the below listed past medical history presents the ER for evaluation of nausea and generalized fatigue and weakness with chills but no measured temperature.  Patient did complete dialysis today.  Denies any chest pain or shortness of breath at this time.  Did complete a full dialysis session.  Is not currently on any antibiotics.  Denies any dysuria.  No diarrhea.  Had one episode of vomiting.    Past Medical History:  Diagnosis Date  . Asthma   . CAD (coronary artery disease)   . Chronic lower back pain   . COPD (chronic obstructive pulmonary disease) ([place])   . Depression   . Diabetes mellitus without complication (HCC)    NIDD  . H/O angina pectoris   . H/O blood clots   . Hypercholesteremia   . Hypertension   . Left rotator cuff tear   . Myocardial infarction ([place])   . Obesity   . Renal insufficiency   . Stroke ([place])   . Vertigo, aural    Family History  Problem Relation Age of Onset  . Cancer Mother   . Cancer Father   . Diabetes Brother   . Heart disease Brother    Past Surgical History:  Procedure Laterality Date  . A/V FISTULAGRAM Left 03/14/2017   Procedure: A/V Fistulagram;  Surgeon: Katha Cabal, MD;  Location: [place] CV [facility];  Service: Cardiovascular;  Laterality: Left;  . AV FISTULA PLACEMENT Left 11-04-2015  . AV FISTULA PLACEMENT Left 11/04/2015   Procedure: ARTERIOVENOUS (AV) FISTULA CREATION  ( BRACHIAL CEPHALIC );  Surgeon: Katha Cabal, MD;  Location: [facility] ORS;  Service: Vascular;  Laterality: Left;  . CARDIAC CATHETERIZATION    . CARDIAC CATHETERIZATION Left 07/19/2016   Procedure: Left Heart Cath and Coronary Angiography;  Surgeon: Corey Skains, MD;  Location: [place] [facility] LAB;  Service: Cardiovascular;  Laterality: Left;  . CORONARY ARTERY BYPASS GRAFT  2012  . CORONARY STENT PLACEMENT    . JOINT REPLACEMENT    . KNEE SURGERY Bilateral   . LEFT HEART CATH AND CORONARY ANGIOGRAPHY N/A 12/25/2017   Procedure: LEFT HEART CATH AND CORONARY ANGIOGRAPHY;  Surgeon: Teodoro Spray, MD;  Location: [place] [facility];  Service: Cardiovascular;  Laterality: N/A;  . LEFT HEART CATH AND CORONARY ANGIOGRAPHY Right 03/23/2018   Procedure: LEFT HEART CATH AND CORONARY ANGIOGRAPHY;  Surgeon: Dionisio David, MD;  Location: [place] [facility];  Service: Cardiovascular;  Laterality: Right;  . LEFT HEART CATH AND CORONARY ANGIOGRAPHY N/A 05/30/2018   Procedure: LEFT HEART CATH AND CORONARY ANGIOGRAPHY;  Surgeon: Corey Skains, MD;  Location: [place] CV LAB;  Service: Cardiovascular;  Laterality: N/A;  . PERIPHERAL VASCULAR CATHETERIZATION N/A 11/13/2015   Procedure: Dialysis/Perma Catheter Insertion;  Surgeon: Katha Cabal, MD;  Location: [place] [facility];  Service: Cardiovascular;  Laterality: N/A;  . PERIPHERAL VASCULAR CATHETERIZATION N/A 12/04/2015   Procedure: Dialysis/Perma Catheter Insertion;  Surgeon: Katha Cabal, MD;  Location: [place] [facility] LAB;  Service: Cardiovascular;  Laterality: N/A;  . PERIPHERAL VASCULAR CATHETERIZATION Left 12/31/2015   Procedure: A/V Shuntogram/Fistulagram;  Surgeon: Algernon Huxley, MD;  Location[facility]  INVASIVE CV LAB;  Service: Cardiovascular;  Laterality: Left;  . PERIPHERAL VASCULAR CATHETERIZATION N/A 12/31/2015   Procedure: A/V Shunt Intervention;  Surgeon: Algernon Huxley, MD;  Location: [place] CV LAB;  Service: Cardiovascular;  Laterality: N/A;  . PERIPHERAL VASCULAR CATHETERIZATION Left 02/25/2016   Procedure: A/V Shuntogram/Fistulagram;  Surgeon: Algernon Huxley, MD;  Location: [place] CV LAB;  Service: Cardiovascular;  Laterality: Left;  . PERIPHERAL VASCULAR  CATHETERIZATION N/A 03/18/2016   Procedure: Dialysis/Perma Catheter Removal;  Surgeon: Katha Cabal, MD;  Location: [place] CV LAB;  Service: Cardiovascular;  Laterality: N/A;  . REMOVAL OF A DIALYSIS CATHETER N/A 06/30/2017   Procedure: REMOVAL OF A DIALYSIS CATHETER;  Surgeon: Katha Cabal, MD;  Location: [facility] ORS;  Service: Vascular;  Laterality: N/A;  . REPLACEMENT TOTAL KNEE BILATERAL Bilateral    Patient Active Problem List   Diagnosis Date Noted  . Acute on chronic respiratory failure with hypoxemia ([place]) 06/19/2018  . Acute respiratory failure ([place]) 06/19/2018  . Acute on chronic congestive heart failure ([place])   . ESRD on hemodialysis ([place])   . Palliative care by specialist   . Acute respiratory failure with hypoxemia ([place]) 04/18/2018  . Anemia of chronic disease 03/24/2018  . NSTEMI (non-ST elevated myocardial infarction) ([place]) 03/23/2018  . Acute on chronic diastolic CHF (congestive heart failure) ([place]) 03/02/2018  . GERD (gastroesophageal reflux disease) 03/02/2018  . Respiratory failure ([place]) 01/11/2018  . Abdominal pain 06/28/2017  . Anxiety, generalized 06/27/2017  . COPD (chronic obstructive pulmonary disease) ([place]) 06/26/2017  . Peritonitis ([place]) 05/28/2017  . Bilateral carotid artery stenosis 03/08/2017  . ESRD on dialysis ([place]) 03/08/2017  . Non-ST elevation myocardial infarction (NSTEMI), subendocardial infarction, subsequent episode of care ([place]) 11/04/2016  . Onychogryphosis 10/31/2016  . Onychomycosis 10/31/2016  . Subungual exostosis 10/31/2016  . Type 2 diabetes mellitus with diabetic neuropathy ([place]) 10/31/2016  . Chronic osteomyelitis of left hand ([place]) 09/03/2016  . Left arm swelling 08/30/2016  . Pre-operative clearance 08/30/2016  . Coronary artery disease of native artery of native heart with stable angina pectoris ([place]) 08/03/2016  . Benign paroxysmal positional vertigo due to bilateral vestibular disorder 07/26/2016  . H/O: CVA  (cerebrovascular accident) 07/26/2016  . Stable angina ([place]) 07/05/2016  . Sepsis due to pneumonia ([place]) 07/05/2016  . Fracture of left foot 04/26/2016  . Pre-transplant evaluation for kidney transplant 02/04/2016  . Chest pain at rest 12/17/2015  . Chest pain 12/17/2015  . DJD of shoulder 10/21/2015  . Perianal lesion 05/20/2015  . 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 Lumbar radiculopathy 05/14/2015  . 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 Benign essential hypertension 04/22/2015  . Grief 03/18/2015  . Lumbosacral facet joint syndrome 02/16/2015  . Greater trochanteric bursitis 02/16/2015  . DDD (degenerative disc disease), lumbosacral 01/20/2015  . 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 Osteoarthritis 01/20/2015  . IDA (iron deficiency anemia) 03/25/2014  . Thyroid nodule 01/15/2014  . Lung nodule 08/28/2013  . Tobacco abuse 05/17/2013  . Mixed hyperlipidemia 06/14/2011  . OSA on CPAP 06/14/2011  . PAD (peripheral artery disease) ([place]) 06/14/2011  . Depression 05/05/2011  . Chronic kidney disease (CKD), stage V ([place]) 03/26/2011  . Multiple vessel coronary artery disease 03/26/2011  . Obesity, unspecified 03/26/2011  . Type 2 diabetes mellitus ([place]) 03/26/2011      Prior to Admission medications   Medication Sig Start Date End Date Taking? Authorizing Provider  acetaminophen (TYLENOL) 325 MG tablet  Take 2 tablets (650 mg total) by mouth every 6 (six) hours as needed for mild pain (or Fever >/= 101). 05/30/18  Yes Awilda Bill, NP  albuterol (PROVENTIL) (2.5 MG/3ML) 0.083% nebulizer solution Take 3 mLs (2.5 mg total) by nebulization every 4 (four) hours as needed for wheezing or shortness of breath. 05/30/18  Yes Awilda Bill, NP  alteplase (CATHFLO ACTIVASE) 2 MG injection 2 mg by Intracatheter route once as needed for open catheter. 05/30/18  Yes Awilda Bill, NP  amLODipine (NORVASC) 10 MG tablet Take 10 mg by mouth  daily.   Yes [provider]  aspirin 81 MG chewable tablet Chew 81 mg by mouth daily.   Yes [provider]  atorvastatin (LIPITOR) 40 MG tablet Take 40 mg by mouth at bedtime. 03/28/18  Yes [provider]  budesonide-formoterol (SYMBICORT) 160-4.5 MCG/ACT inhaler Inhale 2 puffs into the lungs 2 (two) times daily.   Yes [provider]  calcium carbonate (TUMS - DOSED IN MG ELEMENTAL CALCIUM) 500 MG chewable tablet Chew 2 tablets by mouth 3 (three) times daily.   Yes [provider]  cetirizine (ZYRTEC) 10 MG tablet Take 10 mg by mouth daily.   Yes [provider]  Chlorhexidine Gluconate Cloth 2 % PADS Apply 6 each topically daily at 6 (six) AM. 05/31/18  Yes Awilda Bill, NP  cinacalcet (SENSIPAR) 30 MG tablet Take 1 tablet (30 mg total) by mouth daily before breakfast. 05/31/18  Yes Awilda Bill, NP  clopidogrel (PLAVIX) 75 MG tablet Take 1 tablet (75 mg total) by mouth daily. 05/31/18  Yes Awilda Bill, NP  docusate sodium (COLACE) 100 MG capsule Take 1 capsule (100 mg total) by mouth 2 (two) times daily. 05/30/18  Yes Awilda Bill, NP  fluticasone (FLONASE) 50 MCG/ACT nasal spray Place 2 sprays into both nostrils 2 (two) times daily.   Yes [provider]  fluticasone furoate-vilanterol (BREO ELLIPTA) 200-25 MCG/INH AEPB Inhale 1 puff into the lungs daily. 05/31/18  Yes Awilda Bill, NP  folic acid (FOLVITE) 1 MG tablet Take 1 mg by mouth daily.   Yes [provider]  isosorbide mononitrate (IMDUR) 30 MG 24 hr tablet Take 30 mg by mouth daily.   Yes [provider]  lidocaine-prilocaine (EMLA) cream Apply 1 application topically as needed (topical anesthesia for hemodialysis if Gebauers and Lidocaine injection are ineffective.). 05/30/18  Yes Awilda Bill, NP  lubiprostone (AMITIZA) 24 MCG capsule Take 24 mcg by mouth 2 (two) times daily with a meal.   Yes [provider]  meclizine  (ANTIVERT) 25 MG tablet Take 25 mg by mouth 3 (three) times daily as needed for dizziness.   Yes [provider]  metoprolol succinate (TOPROL-XL) 100 MG 24 hr tablet Take 1 tablet (100 mg total) by mouth daily with breakfast. Take with or immediately following a meal. 05/31/18  Yes Awilda Bill, NP  multivitamin (RENA-VIT) TABS tablet Take 1 tablet by mouth at bedtime. 05/30/18  Yes Awilda Bill, NP  nitroGLYCERIN (NITROSTAT) 0.4 MG SL tablet Place 1 tablet (0.4 mg total) under the tongue every 5 (five) minutes as needed for chest pain. 05/30/18  Yes Awilda Bill, NP  ondansetron (ZOFRAN) 4 MG tablet Take 1 tablet (4 mg total) by mouth every 6 (six) hours as needed for nausea. 05/30/18  Yes Awilda Bill, NP  Oxycodone HCl 10 MG TABS Take 10 mg by mouth every 6 (six) hours  as needed (pain).   Yes [provider]  pantoprazole (PROTONIX) 40 MG tablet Take 40 mg by mouth 2 (two) times daily.   Yes [provider]  polyethylene glycol (MIRALAX / GLYCOLAX) packet Take 17 g by mouth daily. 05/31/18  Yes Awilda Bill, NP  ranolazine (RANEXA) 500 MG 12 hr tablet Take 500 mg by mouth 2 (two) times daily. 04/13/18  Yes [provider]  sevelamer carbonate (RENVELA) 800 MG tablet Take 1,600 mg by mouth 3 (three) times daily with meals.   Yes [provider]  tiotropium (SPIRIVA HANDIHALER) 18 MCG inhalation capsule Place 1 capsule (18 mcg total) into inhaler and inhale daily. 05/31/18  Yes Awilda Bill, NP  torsemide (DEMADEX) 100 MG tablet Take 100 mg by mouth daily.   Yes [provider]  venlafaxine XR (EFFEXOR-XR) 37.5 MG 24 hr capsule Take 37.5 mg by mouth daily with breakfast.   Yes [provider]    Allergies Nystatin; Sulfa antibiotics; and Niaspan [niacin]    Social History Social History   Tobacco Use  . Smoking status: Current Every Day Smoker    Packs/day: 0.25    Types: Cigarettes  . Smokeless tobacco:  Never Used  Substance Use Topics  . Alcohol use: No    Alcohol/week: 0.0 standard drinks  . Drug use: No    Review of Systems Patient denies headaches, rhinorrhea, blurry vision, numbness, shortness of breath, chest pain, edema, cough, abdominal pain, nausea, vomiting, diarrhea, dysuria, fevers, rashes or hallucinations unless otherwise stated above in HPI. ____________________________________________   PHYSICAL EXAM:  VITAL SIGNS: Vitals:   11/27/18 1700 11/27/18 1730  BP: (!) 119/99 (!) 141/50  Pulse: 73 75  Resp: 19 (!) 24  Temp:    SpO2: 97% 92%    Constitutional: Alert and oriented.  Eyes: Conjunctivae are normal.  Head: Atraumatic. Nose: No congestion/rhinnorhea. Mouth/Throat: Mucous membranes are moist.   Neck: No stridor. Painless ROM.  Cardiovascular: Normal rate, regular rhythm. Grossly normal heart sounds.  Good peripheral circulation. Respiratory: Normal respiratory effort.  No retractions. Lungs with coarse bibasilar breathsounds without any notable wheeze.  BS equal bilaterally Gastrointestinal: Soft and nontender. No distention. No abdominal bruits. No CVA tenderness. Genitourinary: deferred Musculoskeletal: No lower extremity tenderness nor edema.  No joint effusions. Neurologic:  Normal speech and language. No gross focal neurologic deficits are appreciated. No facial droop Skin:  Skin is warm, dry and intact. No rash noted. Psychiatric: Mood and affect are normal. Speech and behavior are normal.  ____________________________________________   LABS (all labs ordered are listed, but only abnormal results are displayed)  Results for orders placed or performed during the hospital encounter of 11/27/18 (from the past 24 hour(s))  CBC with Differential/Platelet     Status: Abnormal   Collection Time: 11/27/18  2:27 PM  Result Value Ref Range   WBC 7.6 4.0 - 10.5 K/uL   RBC 4.33 3.87 - 5.11 MIL/uL   Hemoglobin 11.9 (L) 12.0 - 15.0 g/dL   HCT 38.4 36.0 -  46.0 %   MCV 88.7 80.0 - 100.0 fL   MCH 27.5 26.0 - 34.0 pg   MCHC 31.0 30.0 - 36.0 g/dL   RDW 16.5 (H) 11.5 - 15.5 %   Platelets 198 150 - 400 K/uL   nRBC 0.0 0.0 - 0.2 %   Neutrophils Relative % 81 %   Neutro Abs 6.2 1.7 - 7.7 K/uL   Lymphocytes Relative 9 %   Lymphs Abs 0.7 0.7 -  4.0 K/uL   Monocytes Relative 9 %   Monocytes Absolute 0.7 0.1 - 1.0 K/uL   Eosinophils Relative 1 %   Eosinophils Absolute 0.1 0.0 - 0.5 K/uL   Basophils Relative 0 %   Basophils Absolute 0.0 0.0 - 0.1 K/uL   Immature Granulocytes 0 %   Abs Immature Granulocytes 0.02 0.00 - 0.07 K/uL  Comprehensive metabolic panel     Status: Abnormal   Collection Time: 11/27/18  2:27 PM  Result Value Ref Range   Sodium 134 (L) 135 - 145 mmol/L   Potassium 3.3 (L) 3.5 - 5.1 mmol/L   Chloride 93 (L) 98 - 111 mmol/L   CO2 27 22 - 32 mmol/L   Glucose, Bld 114 (H) 70 - 99 mg/dL   BUN 19 8 - 23 mg/dL   Creatinine, Ser 4.92 (H) 0.44 - 1.00 mg/dL   Calcium 7.9 (L) 8.9 - 10.3 mg/dL   Total Protein 8.4 (H) 6.5 - 8.1 g/dL   Albumin 3.6 3.5 - 5.0 g/dL   AST 15 15 - 41 U/L   ALT 7 0 - 44 U/L   Alkaline Phosphatase 103 38 - 126 U/L   Total Bilirubin 0.7 0.3 - 1.2 mg/dL   GFR calc non Af Amer 8 (L) >60 mL/min   GFR calc Af Amer 10 (L) >60 mL/min   Anion gap 14 5 - 15  Brain natriuretic peptide     Status: Abnormal   Collection Time: 11/27/18  2:27 PM  Result Value Ref Range   B Natriuretic Peptide 588.0 (H) 0.0 - 100.0 pg/mL  Troponin I - Add-On to previous collection     Status: Abnormal   Collection Time: 11/27/18  2:27 PM  Result Value Ref Range   Troponin I 0.04 (HH) <0.03 ng/mL  Influenza panel by PCR (type A & B)     Status: None   Collection Time: 11/27/18  4:06 PM  Result Value Ref Range   Influenza A By PCR NEGATIVE NEGATIVE   Influenza B By PCR NEGATIVE NEGATIVE  Troponin I - ONCE - STAT     Status: Abnormal   Collection Time: 11/27/18  6:02 PM  Result Value Ref Range   Troponin I 0.03 (HH) <0.03 ng/mL     ____________________________________________  EKG My review and personal interpretation at Time: 14:18   Indication: weakness  Rate: 75  Rhythm: sinus Axis: sinus Other: anterolateral t wave inversions, unchanged from previous ____________________________________________  RADIOLOGY  I personally reviewed all radiographic images ordered to evaluate for the above acute complaints and reviewed radiology reports and findings.  These findings were personally discussed with the patient.  Please see medical record for radiology report.  ____________________________________________   PROCEDURES  Procedure(s) performed:  Procedures    Critical Care performed: no ____________________________________________   INITIAL IMPRESSION / ASSESSMENT AND PLAN / ED COURSE  Pertinent labs & imaging results that were available during my care of the patient were reviewed by me and considered in my medical decision making (see chart for details).   DDX: elctrolyte abnormality, anemia, dehydration, flu, pneumonia, gastritis, enteritis  JERALDIN FESLER is a 68 y.o. who presents to the ED with symptoms as described above.  Patient is AFVSS in ED. Exam as above. Given current presentation have considered the above differential.  Will be sent for the by differential.  EKG is without any signs of ischemic changes as compared to previous.  Abdominal exam soft and benign.  Clinical Course as of Mar  [place] Nov 27, 2018  1706 Blood work thus far is fairly reassuring.  Chest x-ray does report increased edema but she does not have any hypoxia.  BNP significantly improved as compared to previous troponin is roughly at baseline.  Patient states that she has not had anything to eat or drink in 24 hours which could certainly explain her weaknes epigastric discomfort.  Repeat abdominal exam is benign.   [PR]  5732 Patient reassessed and feels much improved after eating.  Repeat abdominal exam is soft and  benign.  Will repeat troponin to ensure there is no sign of myocardial ischemia but if roughly within her baseline does not seem clinically consistent with ACS.   [PR]  1840 Repeat troponin is improved from previous.  Does not seem clinically consistent with ACS.  No evidence of acute heart failure.  She is satting well on room air denies any shortness of breath and is able to ambulate without any hypoxia or respiratory distress.  Her symptoms resolved after having something to eat.  I do believe she stable and appropriate for outpatient follow-up.   [PR]    Clinical Course User Index [PR] Merlyn Lot, MD     As part of my medical decision making, I reviewed the following data within the [place] notes reviewed and incorporated, Labs reviewed, notes from prior ED visits and [place] Controlled Substance Database   ____________________________________________   FINAL CLINICAL IMPRESSION(S) / ED DIAGNOSES  Final diagnoses:  Generalized weakness  ESRD (end stage renal disease) on dialysis [facility])      NEW MEDICATIONS STARTED DURING THIS VISIT:  New Prescriptions   No medications on file     Note:  This document was prepared using Dragon voice recognition software and may include unintentional dictation errors.    Merlyn Lot, MD 11/27/18 1843

## 2018-11-27 NOTE — Discharge Instructions (Addendum)
Follow-up with PCP.  Return to the ER if you develop any worsening symptoms that would include shortness of breath chest pain nausea or vomiting or for any additional questions or concerns.

## 2018-11-27 NOTE — ED Triage Notes (Signed)
Pt just keeps stating "I feel so bad".  Does report her stomach is upset.  Has had vomiting. Just left dialysis. C/o feeling weak. Pt just leaning forward in wheelchair, difficult to get story. Daughter is parking car.

## 2018-11-27 NOTE — ED Notes (Signed)
PT o2 remains 94%-96% while ambulating

## 2019-02-28 ENCOUNTER — Other Ambulatory Visit (INDEPENDENT_AMBULATORY_CARE_PROVIDER_SITE_OTHER): Payer: Self-pay | Admitting: Vascular Surgery

## 2019-02-28 DIAGNOSIS — N186 End stage renal disease: Secondary | ICD-10-CM

## 2019-03-01 ENCOUNTER — Other Ambulatory Visit: Payer: Self-pay

## 2019-03-01 ENCOUNTER — Ambulatory Visit (INDEPENDENT_AMBULATORY_CARE_PROVIDER_SITE_OTHER): Payer: Medicare Other | Admitting: Vascular Surgery

## 2019-03-01 ENCOUNTER — Encounter (INDEPENDENT_AMBULATORY_CARE_PROVIDER_SITE_OTHER): Payer: Self-pay | Admitting: Vascular Surgery

## 2019-03-01 ENCOUNTER — Ambulatory Visit (INDEPENDENT_AMBULATORY_CARE_PROVIDER_SITE_OTHER): Payer: Medicare Other

## 2019-03-01 VITALS — BP 116/65 | HR 74 | Resp 12 | Ht 66.5 in | Wt 220.0 lb

## 2019-03-01 DIAGNOSIS — Z992 Dependence on renal dialysis: Secondary | ICD-10-CM

## 2019-03-01 DIAGNOSIS — I10 Essential (primary) hypertension: Secondary | ICD-10-CM

## 2019-03-01 DIAGNOSIS — N186 End stage renal disease: Secondary | ICD-10-CM

## 2019-03-01 DIAGNOSIS — I120 Hypertensive chronic kidney disease with stage 5 chronic kidney disease or end stage renal disease: Secondary | ICD-10-CM

## 2019-03-01 DIAGNOSIS — E1122 Type 2 diabetes mellitus with diabetic chronic kidney disease: Secondary | ICD-10-CM

## 2019-03-01 DIAGNOSIS — F17210 Nicotine dependence, cigarettes, uncomplicated: Secondary | ICD-10-CM

## 2019-03-01 DIAGNOSIS — Z79899 Other long term (current) drug therapy: Secondary | ICD-10-CM

## 2019-03-01 NOTE — Assessment & Plan Note (Signed)
blood pressure control important in reducing the progression of atherosclerotic disease. On appropriate oral medications.  

## 2019-03-01 NOTE — Progress Notes (Signed)
Patient ID: Kimberly Shaffer, female   DOB: 17-Mar-1951, 68 y.o.   MRN: [id number]  Chief Complaint  Patient presents with  . New Patient (Initial Visit)    HPI Kimberly Shaffer is a 68 y.o. female.  I am asked to see the patient by Dr. Candiss Norse for evaluation of her dialysis access.  She has longstanding renal failure.  We have last seen her about 2 years ago.  We performed multiple interventions on this left arm AV fistula 2 to 3 years ago.  She had a brief trial of PD but this did not work well for her and her PD catheter was removed in 2018.  She has been using the fistula ever since.  She had a difficult access and was unable to run last week and this appointment was made to evaluate this.  Other than that one time, it has worked reasonably well without prolonged bleeding, low flows, or difficulty with access.  Duplex today shows a patent left brachiocephalic AV fistula and previously placed stent in the proximal arm outflow vein.  There are minimally elevated velocities at the stent end, but this does not appear hemodynamically significant.     Past Medical History:  Diagnosis Date  . Asthma   . CAD (coronary artery disease)   . Chronic lower back pain   . COPD (chronic obstructive pulmonary disease) ([place])   . Depression   . Diabetes mellitus without complication (HCC)    NIDD  . H/O angina pectoris   . H/O blood clots   . Hypercholesteremia   . Hypertension   . Left rotator cuff tear   . Myocardial infarction ([place])   . Obesity   . Renal insufficiency   . Stroke ([place])   . Vertigo, aural     Past Surgical History:  Procedure Laterality Date  . A/V FISTULAGRAM Left 03/14/2017   Procedure: A/V Fistulagram;  Surgeon: Katha Cabal, MD;  Location: [place] [facility];  Service: Cardiovascular;  Laterality: Left;  . AV FISTULA PLACEMENT Left 11-04-2015  . AV FISTULA PLACEMENT Left 11/04/2015   Procedure: ARTERIOVENOUS (AV) FISTULA CREATION  ( BRACHIAL CEPHALIC );  Surgeon:  Katha Cabal, MD;  Location: [facility];  Service: Vascular;  Laterality: Left;  . CARDIAC CATHETERIZATION    . CARDIAC CATHETERIZATION Left 07/19/2016   Procedure: Left Heart Cath and Coronary Angiography;  Surgeon: Kimberly Skains, MD;  Location: [place] [facility];  Service: Cardiovascular;  Laterality: Left;  . CORONARY ARTERY BYPASS GRAFT  2012  . CORONARY STENT PLACEMENT    . JOINT REPLACEMENT    . KNEE SURGERY Bilateral   . LEFT HEART CATH AND CORONARY ANGIOGRAPHY N/A 12/25/2017   Procedure: LEFT HEART CATH AND CORONARY ANGIOGRAPHY;  Surgeon: Kimberly Spray, MD;  Location: [place] [facility];  Service: Cardiovascular;  Laterality: N/A;  . LEFT HEART CATH AND CORONARY ANGIOGRAPHY Right 03/23/2018   Procedure: LEFT HEART CATH AND CORONARY ANGIOGRAPHY;  Surgeon: Kimberly David, MD;  Location: [place] [facility];  Service: Cardiovascular;  Laterality: Right;  . LEFT HEART CATH AND CORONARY ANGIOGRAPHY N/A 05/30/2018   Procedure: LEFT HEART CATH AND CORONARY ANGIOGRAPHY;  Surgeon: Kimberly Skains, MD;  Location: [place] [facility];  Service: Cardiovascular;  Laterality: N/A;  . PERIPHERAL VASCULAR CATHETERIZATION N/A 11/13/2015   Procedure: Dialysis/Perma Catheter Insertion;  Surgeon: Katha Cabal, MD;  Location: [place] [facility];  Service: Cardiovascular;  Laterality: N/A;  . PERIPHERAL  VASCULAR CATHETERIZATION N/A 12/04/2015   Procedure: Dialysis/Perma Catheter Insertion;  Surgeon: Katha Cabal, MD;  Location: [place] [facility];  Service: Cardiovascular;  Laterality: N/A;  . PERIPHERAL VASCULAR CATHETERIZATION Left 12/31/2015   Procedure: A/V Shuntogram/Fistulagram;  Surgeon: Kimberly Huxley, MD;  Location: [place] [facility];  Service: Cardiovascular;  Laterality: Left;  . PERIPHERAL VASCULAR CATHETERIZATION N/A 12/31/2015   Procedure: A/V Shunt Intervention;  Surgeon: Kimberly Huxley, MD;  Location: [place] [facility];  Service: Cardiovascular;  Laterality: N/A;  .  PERIPHERAL VASCULAR CATHETERIZATION Left 02/25/2016   Procedure: A/V Shuntogram/Fistulagram;  Surgeon: Kimberly Huxley, MD;  Location: [place] [facility];  Service: Cardiovascular;  Laterality: Left;  . PERIPHERAL VASCULAR CATHETERIZATION N/A 03/18/2016   Procedure: Dialysis/Perma Catheter Removal;  Surgeon: Katha Cabal, MD;  Location: [place] [facility];  Service: Cardiovascular;  Laterality: N/A;  . REMOVAL OF A DIALYSIS CATHETER N/A 06/30/2017   Procedure: REMOVAL OF A DIALYSIS CATHETER;  Surgeon: Katha Cabal, MD;  Location: [facility];  Service: Vascular;  Laterality: N/A;  . REPLACEMENT TOTAL KNEE BILATERAL Bilateral     Family History Family History  Problem Relation Age of Onset  . Cancer Mother   . Cancer Father   . Diabetes Brother   . Heart disease Brother     Social History Social History   Tobacco Use  . Smoking status: Current Every Day Smoker    Packs/day: 0.25    Types: Cigarettes  . Smokeless tobacco: Never Used  Substance Use Topics  . Alcohol use: No    Alcohol/week: 0.0 standard drinks  . Drug use: No    Allergies  Allergen Reactions  . Nystatin Hives and Itching  . Sulfa Antibiotics Swelling, Hives and Rash  . Niaspan [Niacin] Other (See Comments) and Hives    Current Outpatient Medications  Medication Sig Dispense Refill  . acetaminophen (TYLENOL) 325 MG tablet Take 2 tablets (650 mg total) by mouth every 6 (six) hours as needed for mild pain (or Fever >/= 101).    Marland Kitchen albuterol (PROVENTIL) (2.5 MG/3ML) 0.083% nebulizer solution Take 3 mLs (2.5 mg total) by nebulization every 4 (four) hours as needed for wheezing or shortness of breath. 75 mL 12  . alteplase (CATHFLO ACTIVASE) 2 MG injection 2 mg by Intracatheter route once as needed for open catheter. 1 each   . amLODipine (NORVASC) 10 MG tablet Take 10 mg by mouth daily.    Marland Kitchen aspirin 81 MG chewable tablet Chew 81 mg by mouth daily.    Marland Kitchen atorvastatin (LIPITOR) 40 MG tablet Take 40 mg by mouth  at bedtime.  3  . budesonide-formoterol (SYMBICORT) 160-4.5 MCG/ACT inhaler Inhale 2 puffs into the lungs 2 (two) times daily.    . calcium carbonate (TUMS - DOSED IN MG ELEMENTAL CALCIUM) 500 MG chewable tablet Chew 2 tablets by mouth 3 (three) times daily.    . cetirizine (ZYRTEC) 10 MG tablet Take 10 mg by mouth daily.    . cinacalcet (SENSIPAR) 30 MG tablet Take 1 tablet (30 mg total) by mouth daily before breakfast. 60 tablet   . clopidogrel (PLAVIX) 75 MG tablet Take 1 tablet (75 mg total) by mouth daily.    Marland Kitchen docusate sodium (COLACE) 100 MG capsule Take 1 capsule (100 mg total) by mouth 2 (two) times daily. 10 capsule 0  . fluticasone (FLONASE) 50 MCG/ACT nasal Shaffer Place 2 sprays into both nostrils 2 (two) times daily.    . fluticasone furoate-vilanterol (  BREO ELLIPTA) 200-25 MCG/INH AEPB Inhale 1 puff into the lungs daily.    . folic acid (FOLVITE) 1 MG tablet Take 1 mg by mouth daily.    . isosorbide mononitrate (IMDUR) 30 MG 24 hr tablet Take 30 mg by mouth daily.    . meclizine (ANTIVERT) 25 MG tablet Take 25 mg by mouth 3 (three) times daily as needed for dizziness.    . metoprolol succinate (TOPROL-XL) 100 MG 24 hr tablet Take 1 tablet (100 mg total) by mouth daily with breakfast. Take with or immediately following a meal.    . multivitamin (RENA-VIT) TABS tablet Take 1 tablet by mouth at bedtime.  0  . nitroGLYCERIN (NITROSTAT) 0.4 MG SL tablet Place 1 tablet (0.4 mg total) under the tongue every 5 (five) minutes as needed for chest pain.  12  . ondansetron (ZOFRAN) 4 MG tablet Take 1 tablet (4 mg total) by mouth every 6 (six) hours as needed for nausea. 20 tablet 0  . Oxycodone HCl 10 MG TABS Take 10 mg by mouth every 6 (six) hours as needed (pain).    . pantoprazole (PROTONIX) 40 MG tablet Take 40 mg by mouth 2 (two) times daily.    . polyethylene glycol (MIRALAX / GLYCOLAX) packet Take 17 g by mouth daily. 14 each 0  . ranolazine (RANEXA) 500 MG 12 hr tablet Take 500 mg by mouth  2 (two) times daily.    . sevelamer carbonate (RENVELA) 800 MG tablet Take 1,600 mg by mouth 3 (three) times daily with meals.    . tiotropium (SPIRIVA HANDIHALER) 18 MCG inhalation capsule Place 1 capsule (18 mcg total) into inhaler and inhale daily. 30 capsule 12  . torsemide (DEMADEX) 100 MG tablet Take 100 mg by mouth daily.    Marland Kitchen venlafaxine XR (EFFEXOR-XR) 37.5 MG 24 hr capsule Take 37.5 mg by mouth daily with breakfast.    . Chlorhexidine Gluconate Cloth 2 % PADS Apply 6 each topically daily at 6 (six) AM. (Patient not taking: Reported on 03/01/2019)    . lidocaine-prilocaine (EMLA) cream Apply 1 application topically as needed (topical anesthesia for hemodialysis if Gebauers and Lidocaine injection are ineffective.). (Patient not taking: Reported on 03/01/2019) 30 g 0  . lubiprostone (AMITIZA) 24 MCG capsule Take 24 mcg by mouth 2 (two) times daily with a meal.     No current facility-administered medications for this visit.       REVIEW OF SYSTEMS (Negative unless checked)  Constitutional: [] Weight loss  [] Fever  [] Chills Cardiac: [] Chest pain   [] Chest pressure   [] Palpitations   [] Shortness of breath when laying flat   [] Shortness of breath at rest   [] Shortness of breath with exertion. Vascular:  [] Pain in legs with walking   [] Pain in legs at rest   [] Pain in legs when laying flat   [] Claudication   [] Pain in feet when walking  [] Pain in feet at rest  [] Pain in feet when laying flat   [] History of DVT   [] Phlebitis   [] Swelling in legs   [] Varicose veins   [] Non-healing ulcers Pulmonary:   [] Uses home oxygen   [] Productive cough   [] Hemoptysis   [] Wheeze  [] COPD   [] Asthma Neurologic:  [] Dizziness  [] Blackouts   [] Seizures   [] History of stroke   [] History of TIA  [] Aphasia   [] Temporary blindness   [] Dysphagia   [] Weakness or numbness in arms   [] Weakness or numbness in legs Musculoskeletal:  [] Arthritis   [] Joint swelling   []   Joint pain   [] Low back pain Hematologic:  [] Easy  bruising  [] Easy bleeding   [] Hypercoagulable state   [x] Anemic  [] Hepatitis Gastrointestinal:  [] Blood in stool   [] Vomiting blood  [] Gastroesophageal reflux/heartburn   [] Abdominal pain Genitourinary:  [x] Chronic kidney disease   [] Difficult urination  [] Frequent urination  [] Burning with urination   [] Hematuria Skin:  [] Rashes   [] Ulcers   [] Wounds Psychological:  [] History of anxiety   []  History of major depression.    Physical Exam BP 116/65 (BP Location: Right Arm, Patient Position: Sitting, Cuff Size: Large)   Pulse 74   Resp 12   Ht 5' 6.5" (1.689 m)   Wt 220 lb (99.8 kg)   BMI 34.98 kg/m  Gen:  WD/WN, NAD Head: [place]/AT, No temporalis wasting.  Ear/Nose/Throat: Hearing grossly intact, nares w/o erythema or drainage, oropharynx w/o Erythema/Exudate Eyes: Conjunctiva clear, sclera non-icteric  Neck: trachea midline.  No JVD.  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 No masses, surgical incisions, or scars. Musculoskeletal: M/S 5/5 throughout.  Extremities without ischemic changes.  No deformity or atrophy. Neurologic: Sensation grossly intact in extremities.  Symmetrical.  Speech is fluent. Motor exam as listed above. Psychiatric: Judgment intact, Mood & affect appropriate for pt's clinical situation. Dermatologic: No rashes or ulcers noted.  No cellulitis or open wounds.    Radiology No results found.  Labs No results found for this or any previous visit (from the past 2160 hour(s)).  Assessment/Plan:  Benign essential hypertension blood pressure control important in reducing the progression of atherosclerotic disease. On appropriate oral medications.   Type 2 diabetes mellitus (HCC) blood glucose control important in reducing the progression  of atherosclerotic disease. Also, involved in wound healing. On appropriate medications.   ESRD on dialysis [facility]) Duplex today shows a patent left brachiocephalic AV fistula and previously placed stent in the proximal arm outflow vein.  There are minimally elevated velocities at the stent end, but this does not appear hemodynamically significant.  No intervention is likely to be of benefit.  I suspect that this was a infiltration with a needle outside of the vein that was having difficulty last week.  I have offered her a follow-up appointment in 6 to 12 months or to call us as needed.  She would prefer the latter.      Leotis Pain 03/01/2019, 11:28 AM   This note was created with Dragon medical transcription system.  Any errors from dictation are unintentional.

## 2019-03-01 NOTE — Patient Instructions (Signed)
Vascular Access for Hemodialysis        A vascular access is a connection to the blood inside your blood vessels that allows blood to be easily removed from your body and returned to your body during kidney dialysis (hemodialysis). Hemodialysis is a procedure in which a machine outside of the body filters the blood of a person whose kidneys are no longer working properly. There are three types of vascular accesses:  Arteriovenous fistula (AVF). This is a connection between an artery and a vein (usually in the arm) that is made by sewing them together. Blood in the artery flows directly into the vein, causing it to get larger over time. This makes it easier for the vein to be used for hemodialysis. An arteriovenous fistula takes 1-6 months to develop after surgery.  Arteriovenous graft (AVG). This is a connection between an artery and a vein in the arm that is made with a tube. An arteriovenous graft can be used within 2-3 weeks of surgery.  Venous catheter. This is a thin, flexible tube that is placed in a large vein (usually in the neck, chest, or groin). A venous catheter for hemodialysis contains two tubes that come out of the skin. A venous catheter can be used right away. It is usually used as a temporary access if you need hemodialysis before a fistula or graft has developed, or if kidney failure is sudden (acute) and likely to improve without the need for long-term dialysis. It may also be used as a permanent access if a fistula or graft cannot be created. Which type of access is best for me? The type of access that is best for you depends on the size and strength of your veins, your age, and any other health problems that you have, such as diabetes. An ultrasound test may be used to look at your veins to help make this decision. A fistula is usually the preferred type of access. It can last several years and is less likely than the other types of accesses to become infected or to cause a blood  clot within a blood vessel (thrombosis). However, a fistula is not an option for everyone. If your veins are not the right size or if the fistula does not develop properly, a graft may be used instead. Grafts require you to have strong veins. If your veins are not strong enough for a graft, a catheter may be used. Catheters are more likely than fistulas and grafts to become infected or to have a thrombosis. Sometimes, only one type of access is an option. Your health care provider will help you determine which type of access is best for you. How is a vascular access used? The way that the access is used depends on the type of access:  If the access is a fistula or graft, two needles are inserted through the skin into the access before each hemodialysis session. Blood leaves the body through one of the needles and travels through a tube to the hemodialysis machine (dialyzer). Then it flows through another tube and returns to the body through the second needle.  If the access is a catheter, one tube is connected directly to the tube that leads to the dialyzer, and the other tube is connected to a tube that leads away from the dialyzer. Blood leaves the body through one tube and returns to the body through the other. What problems can occur with vascular access?  A blood clot within a blood vessel (thrombosis).   Thrombosis can lead to a narrowing of a blood vessel (stenosis). If thrombosis occurs frequently, another access site may be created as a backup.  Infection.  Heart enlargement (cardiomegaly) and heart failure. Changes in blood flow may cause an increase in blood pressure or heart rate, making your heart work harder to pump blood. These problems are most likely to occur with a venous catheter and least likely to occur with an arteriovenous fistula. How do I care for my vascular access?  Wear a medical alert bracelet. In case of an emergency, this bracelet tells health care providers that you  are a dialysis patient and allows them to care for your veins appropriately. If you have a graft or fistula:  A "bruit" is a noise that is heard with a stethoscope, and a "thrill" is a vibration that is felt over the graft or fistula. The presence of the bruit and thrill indicates that the access is working. You will be taught to feel for the thrill each day. If this is not felt, the access may be clotted. Call your health care provider.  Keep your arm straight and raised (elevated) above your heart while the access site is healing.  You may freely use the arm where your vascular access is located after the site heals. Keep the following in mind: ? Avoid pressure on the arm. ? Avoid lifting heavy objects with the arm. ? Avoid sleeping on the arm. ? Avoid wearing tight-sleeved shirts or jewelry around the graft or fistula.  Do not allow blood pressure monitoring or needle punctures on the side where the graft or fistula is located.  With permission from your health care provider, you may do exercises to help with blood flow through a fistula. These exercises involve squeezing a rubber ball or other soft objects as instructed.  Wash your access site according to directions from your health care provider. If you have a venous catheter:  Keep the insertion site clean and dry at all times.  If you are told you can shower after the site heals, use a protective covering over the catheter to keep it dry.  Follow directions from your health care provider for bandage (dressing) changes.  Ask your health care provider what activities are safe for you. You may be restricted from lifting or making repetitive arm movements on the side with the catheter. Contact a health care provider if:  Swelling around the graft or fistula gets worse.  You develop new pain.  Your catheter gets damaged. Get help right away if:  You have pain, numbness, an unusual pale skin color, or blue fingers or sores at  the tips of your fingers in the hand on the side of your fistula.  You have chills.  You have a fever.  You have pus or other fluid (drainage) at the vascular access site.  You develop skin redness or red streaking on the skin around, above, or below the vascular access.  You have bleeding at the vascular access that cannot be easily controlled.  Your catheter gets pulled out of place.  You feel your heart racing or skipping beats.  You have chest pain. Summary  A vascular access is a connection to the blood inside your blood vessels that allows blood to be easily removed from your body and returned to your body during kidney dialysis (hemodialysis).  There are three types of vascular accesses.The type of access that is best for you depends on the size and strength of your   veins, your age, and any other health problems that you have, such as diabetes.  A fistula is usually the preferred type of access, although it is not an option for everyone. It can last several years and is less likely than the other types of accesses to become infected or to cause a blood clot within a blood vessel (thrombosis).  Wear a medical alert bracelet. In case of an emergency, this tells health care providers that you are a dialysis patient. This information is not intended to replace advice given to you by your health care provider. Make sure you discuss any questions you have with your health care provider. Document Released: 11/26/2002 Document Revised: 09/30/2016 Document Reviewed: 09/30/2016 Elsevier Interactive Patient Education  2019 [employer].

## 2019-03-01 NOTE — Assessment & Plan Note (Signed)
blood glucose control important in reducing the progression of atherosclerotic disease. Also, involved in wound healing. On appropriate medications.  

## 2019-03-01 NOTE — Assessment & Plan Note (Signed)
Duplex today shows a patent left brachiocephalic AV fistula and previously placed stent in the proximal arm outflow vein.  There are minimally elevated velocities at the stent end, but this does not appear hemodynamically significant.  No intervention is likely to be of benefit.  I suspect that this was a infiltration with a needle outside of the vein that was having difficulty last week.  I have offered her a follow-up appointment in 6 to 12 months or to call us as needed.  She would prefer the latter.

## 2019-04-24 ENCOUNTER — Other Ambulatory Visit: Payer: Self-pay

## 2019-04-24 ENCOUNTER — Encounter: Payer: Self-pay | Admitting: Emergency Medicine

## 2019-04-24 ENCOUNTER — Emergency Department
Admission: EM | Admit: 2019-04-24 | Discharge: 2019-04-24 | Disposition: A | Discharge status: Home / Self Care | Payer: Medicare Other | Attending: Emergency Medicine | Admitting: Emergency Medicine

## 2019-04-24 DIAGNOSIS — E1122 Type 2 diabetes mellitus with diabetic chronic kidney disease: Secondary | ICD-10-CM | POA: Diagnosis not present

## 2019-04-24 DIAGNOSIS — R251 Tremor, unspecified: Secondary | ICD-10-CM | POA: Diagnosis present

## 2019-04-24 DIAGNOSIS — I132 Hypertensive heart and chronic kidney disease with heart failure and with stage 5 chronic kidney disease, or end stage renal disease: Secondary | ICD-10-CM | POA: Insufficient documentation

## 2019-04-24 DIAGNOSIS — I2510 Atherosclerotic heart disease of native coronary artery without angina pectoris: Secondary | ICD-10-CM | POA: Insufficient documentation

## 2019-04-24 DIAGNOSIS — J449 Chronic obstructive pulmonary disease, unspecified: Secondary | ICD-10-CM | POA: Diagnosis not present

## 2019-04-24 DIAGNOSIS — N186 End stage renal disease: Secondary | ICD-10-CM | POA: Diagnosis not present

## 2019-04-24 DIAGNOSIS — Z7982 Long term (current) use of aspirin: Secondary | ICD-10-CM | POA: Insufficient documentation

## 2019-04-24 DIAGNOSIS — Z951 Presence of aortocoronary bypass graft: Secondary | ICD-10-CM | POA: Insufficient documentation

## 2019-04-24 DIAGNOSIS — F17210 Nicotine dependence, cigarettes, uncomplicated: Secondary | ICD-10-CM | POA: Insufficient documentation

## 2019-04-24 DIAGNOSIS — Z7902 Long term (current) use of antithrombotics/antiplatelets: Secondary | ICD-10-CM | POA: Insufficient documentation

## 2019-04-24 DIAGNOSIS — Z992 Dependence on renal dialysis: Secondary | ICD-10-CM | POA: Insufficient documentation

## 2019-04-24 DIAGNOSIS — Z8673 Personal history of transient ischemic attack (TIA), and cerebral infarction without residual deficits: Secondary | ICD-10-CM | POA: Diagnosis not present

## 2019-04-24 DIAGNOSIS — J45909 Unspecified asthma, uncomplicated: Secondary | ICD-10-CM | POA: Diagnosis not present

## 2019-04-24 DIAGNOSIS — I252 Old myocardial infarction: Secondary | ICD-10-CM | POA: Diagnosis not present

## 2019-04-24 DIAGNOSIS — Z96653 Presence of artificial knee joint, bilateral: Secondary | ICD-10-CM | POA: Diagnosis not present

## 2019-04-24 DIAGNOSIS — Z79899 Other long term (current) drug therapy: Secondary | ICD-10-CM | POA: Insufficient documentation

## 2019-04-24 DIAGNOSIS — I5030 Unspecified diastolic (congestive) heart failure: Secondary | ICD-10-CM | POA: Insufficient documentation

## 2019-04-24 LAB — BASIC METABOLIC PANEL
Anion gap: 14 (ref 5–15)
BUN: 40 mg/dL — ABNORMAL HIGH (ref 8–23)
CO2: 22 mmol/L (ref 22–32)
Calcium: 8.1 mg/dL — ABNORMAL LOW (ref 8.9–10.3)
Chloride: 98 mmol/L (ref 98–111)
Creatinine, Ser: 5.54 mg/dL — ABNORMAL HIGH (ref 0.44–1.00)
GFR calc Af Amer: 8 mL/min — ABNORMAL LOW (ref >60)
GFR calc non Af Amer: 7 mL/min — ABNORMAL LOW (ref >60)
Glucose, Bld: 91 mg/dL (ref 70–99)
Potassium: 3.8 mmol/L (ref 3.5–5.1)
Sodium: 134 mmol/L — ABNORMAL LOW (ref 135–145)

## 2019-04-24 LAB — CBC
HCT: 33.1 % — ABNORMAL LOW (ref 36.0–46.0)
Hemoglobin: 10.5 g/dL — ABNORMAL LOW (ref 12.0–15.0)
MCH: 30.5 pg (ref 26.0–34.0)
MCHC: 31.7 g/dL (ref 30.0–36.0)
MCV: 96.2 fL (ref 80.0–100.0)
Platelets: 159 10*3/uL (ref 150–400)
RBC: 3.44 MIL/uL — ABNORMAL LOW (ref 3.87–5.11)
RDW: 15.7 % — ABNORMAL HIGH (ref 11.5–15.5)
WBC: 5.9 10*3/uL (ref 4.0–10.5)
nRBC: 0 % (ref 0.0–0.2)

## 2019-04-24 LAB — GLUCOSE, CAPILLARY: Glucose-Capillary: 76 mg/dL (ref 70–99)

## 2019-04-24 NOTE — ED Notes (Signed)
Pt c/o shaking episodes for the last 6 weeks - she reports generalized fatigue since surgery for toe amputation - denies any other sxs at this time

## 2019-04-24 NOTE — ED Notes (Signed)
Pt assisted to use bathroom.

## 2019-04-24 NOTE — Discharge Instructions (Addendum)
Please follow-up with your doctor at [facility] tomorrow as planned.  Also, please continue your normal dialysis regimen.  Your kidney specialist would like you to discontinue use of your Imdur for now to see if this may be the cause of your tremor.  Will be important to have close follow-up with your doctor tomorrow, as well as continue to follow closely with your nephrology team.

## 2019-04-24 NOTE — ED Provider Notes (Signed)
[facility] Emergency Department Provider Note   ____________________________________________   First MD Initiated Contact with Patient 04/24/19 1608     (approximate)  I have reviewed the triage vital signs and the nursing notes.   HISTORY  Chief Complaint Tremors    HPI Kimberly Shaffer is a 68 y.o. female reports she has been having shaking in her hands and some in her jaw for about 6 weeks  Patient reports she is noticed an intermittent tremor that will come and go lasting sometimes a day or 2 seems to get a little worse after each dialysis session.  She was shaking both hands slightly, also in her mouth.  No seizure.  No fever no chills no pain.  Had normal dialysis today.  No shortness of breath or swelling.  No exposure to coronavirus  Patient reports this is happened to her in the past several months ago as well,.  She also has an appointment tomorrow with [facility] for evaluation of her tremor, reports after dialysis today seem to get worse again as it has prompting her to come for evaluation   No weakness or numbness.  Past Medical History:  Diagnosis Date  . Asthma   . CAD (coronary artery disease)   . Chronic lower back pain   . COPD (chronic obstructive pulmonary disease) ([place])   . Depression   . Diabetes mellitus without complication (HCC)    NIDD  . H/O angina pectoris   . H/O blood clots   . Hypercholesteremia   . Hypertension   . Left rotator cuff tear   . Myocardial infarction ([place])   . Obesity   . Renal insufficiency   . Stroke ([place])   . Vertigo, aural     Patient Active Problem List   Diagnosis Date Noted  . Acute on chronic respiratory failure with hypoxemia ([place]) 06/19/2018  . Acute respiratory failure ([place]) 06/19/2018  . Acute on chronic congestive heart failure ([place])   . ESRD on hemodialysis ([place])   . Palliative care by specialist   . Acute respiratory failure with hypoxemia ([place]) 04/18/2018  . Anemia  of chronic disease 03/24/2018  . NSTEMI (non-ST elevated myocardial infarction) ([place]) 03/23/2018  . Acute on chronic diastolic CHF (congestive heart failure) ([place]) 03/02/2018  . GERD (gastroesophageal reflux disease) 03/02/2018  . Respiratory failure ([place]) 01/11/2018  . Abdominal pain 06/28/2017  . Anxiety, generalized 06/27/2017  . COPD (chronic obstructive pulmonary disease) ([place]) 06/26/2017  . Peritonitis ([place]) 05/28/2017  . Bilateral carotid artery stenosis 03/08/2017  . ESRD on dialysis ([place]) 03/08/2017  . Non-ST elevation myocardial infarction (NSTEMI), subendocardial infarction, subsequent episode of care ([place]) 11/04/2016  . Onychogryphosis 10/31/2016  . Onychomycosis 10/31/2016  . Subungual exostosis 10/31/2016  . Type 2 diabetes mellitus with diabetic neuropathy ([place]) 10/31/2016  . Chronic osteomyelitis of left hand ([place]) 09/03/2016  . Left arm swelling 08/30/2016  . Pre-operative clearance 08/30/2016  . Coronary artery disease of native artery of native heart with stable angina pectoris ([place]) 08/03/2016  . Benign paroxysmal positional vertigo due to bilateral vestibular disorder 07/26/2016  . H/O: CVA (cerebrovascular accident) 07/26/2016  . Stable angina ([place]) 07/05/2016  . Sepsis due to pneumonia ([place]) 07/05/2016  . Fracture of left foot 04/26/2016  . Pre-transplant evaluation for kidney transplant 02/04/2016  . Chest pain at rest 12/17/2015  . Chest pain 12/17/2015  . DJD of shoulder 10/21/2015  . Perianal lesion 05/20/2015  . 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  Lumbar radiculopathy 05/14/2015  . 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 Benign essential hypertension 04/22/2015  . Grief 03/18/2015  . Lumbosacral facet joint syndrome 02/16/2015  . Greater trochanteric bursitis 02/16/2015  . DDD (degenerative disc disease), lumbosacral 01/20/2015  . 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 Osteoarthritis 01/20/2015  . IDA  (iron deficiency anemia) 03/25/2014  . Thyroid nodule 01/15/2014  . Lung nodule 08/28/2013  . Tobacco abuse 05/17/2013  . Mixed hyperlipidemia 06/14/2011  . OSA on CPAP 06/14/2011  . PAD (peripheral artery disease) ([place]) 06/14/2011  . Depression 05/05/2011  . Chronic kidney disease (CKD), stage V ([place]) 03/26/2011  . Multiple vessel coronary artery disease 03/26/2011  . Obesity, unspecified 03/26/2011  . Type 2 diabetes mellitus ([place]) 03/26/2011    Past Surgical History:  Procedure Laterality Date  . A/V FISTULAGRAM Left 03/14/2017   Procedure: A/V Fistulagram;  Surgeon: Katha Cabal, MD;  Location: [facility];  Service: Cardiovascular;  Laterality: Left;  . AV FISTULA PLACEMENT Left 11-04-2015  . AV FISTULA PLACEMENT Left 11/04/2015   Procedure: ARTERIOVENOUS (AV) FISTULA CREATION  ( BRACHIAL CEPHALIC );  Surgeon: Katha Cabal, MD;  Location: [facility];  Service: Vascular;  Laterality: Left;  . CARDIAC CATHETERIZATION    . CARDIAC CATHETERIZATION Left 07/19/2016   Procedure: Left Heart Cath and Coronary Angiography;  Surgeon: Corey Skains, MD;  Location: [facility];  Service: Cardiovascular;  Laterality: Left;  . CORONARY ARTERY BYPASS GRAFT  2012  . CORONARY STENT PLACEMENT    . JOINT REPLACEMENT    . KNEE SURGERY Bilateral   . LEFT HEART CATH AND CORONARY ANGIOGRAPHY N/A 12/25/2017   Procedure: LEFT HEART CATH AND CORONARY ANGIOGRAPHY;  Surgeon: Teodoro Spray, MD;  Location: [facility];  Service: Cardiovascular;  Laterality: N/A;  . LEFT HEART CATH AND CORONARY ANGIOGRAPHY Right 03/23/2018   Procedure: LEFT HEART CATH AND CORONARY ANGIOGRAPHY;  Surgeon: Dionisio David, MD;  Location: [facility];  Service: Cardiovascular;  Laterality: Right;  . LEFT HEART CATH AND CORONARY ANGIOGRAPHY N/A 05/30/2018   Procedure: LEFT HEART CATH AND CORONARY ANGIOGRAPHY;  Surgeon: Corey Skains, MD;  Location: [facility];  Service:  Cardiovascular;  Laterality: N/A;  . PERIPHERAL VASCULAR CATHETERIZATION N/A 11/13/2015   Procedure: Dialysis/Perma Catheter Insertion;  Surgeon: Katha Cabal, MD;  Location: [facility];  Service: Cardiovascular;  Laterality: N/A;  . PERIPHERAL VASCULAR CATHETERIZATION N/A 12/04/2015   Procedure: Dialysis/Perma Catheter Insertion;  Surgeon: Katha Cabal, MD;  Location: [facility];  Service: Cardiovascular;  Laterality: N/A;  . PERIPHERAL VASCULAR CATHETERIZATION Left 12/31/2015   Procedure: A/V Shuntogram/Fistulagram;  Surgeon: Algernon Huxley, MD;  Location: [facility];  Service: Cardiovascular;  Laterality: Left;  . PERIPHERAL VASCULAR CATHETERIZATION N/A 12/31/2015   Procedure: A/V Shunt Intervention;  Surgeon: Algernon Huxley, MD;  Location: [facility];  Service: Cardiovascular;  Laterality: N/A;  . PERIPHERAL VASCULAR CATHETERIZATION Left 02/25/2016   Procedure: A/V Shuntogram/Fistulagram;  Surgeon: Algernon Huxley, MD;  Location: [facility];  Service: Cardiovascular;  Laterality: Left;  . PERIPHERAL VASCULAR CATHETERIZATION N/A 03/18/2016   Procedure: Dialysis/Perma Catheter Removal;  Surgeon: Katha Cabal, MD;  Location: [facility];  Service: Cardiovascular;  Laterality: N/A;  . REMOVAL OF A DIALYSIS CATHETER N/A 06/30/2017   Procedure: REMOVAL OF A DIALYSIS CATHETER;  Surgeon: Katha Cabal, MD;  Location: [facility];  Service: Vascular;  Laterality: N/A;  . REPLACEMENT TOTAL KNEE BILATERAL Bilateral     Prior to Admission medications   Medication Sig Start Date End Date Taking? Authorizing Provider  acetaminophen (TYLENOL) 325 MG tablet Take 2 tablets (650 mg total) by mouth every 6 (six) hours as needed for mild pain (or Fever >/= 101). 05/30/18   Awilda Bill, NP  albuterol (PROVENTIL) (2.5 MG/3ML) 0.083% nebulizer solution Take 3 mLs (2.5 mg total) by nebulization every 4 (four) hours as needed for wheezing or shortness of  breath. 05/30/18   Awilda Bill, NP  alteplase (CATHFLO ACTIVASE) 2 MG injection 2 mg by Intracatheter route once as needed for open catheter. 05/30/18   Awilda Bill, NP  amLODipine (NORVASC) 10 MG tablet Take 10 mg by mouth daily.    [provider]  aspirin 81 MG chewable tablet Chew 81 mg by mouth daily.    [provider]  atorvastatin (LIPITOR) 40 MG tablet Take 40 mg by mouth at bedtime. 03/28/18   [provider]  budesonide-formoterol (SYMBICORT) 160-4.5 MCG/ACT inhaler Inhale 2 puffs into the lungs 2 (two) times daily.    [provider]  calcium carbonate (TUMS - DOSED IN MG ELEMENTAL CALCIUM) 500 MG chewable tablet Chew 2 tablets by mouth 3 (three) times daily.    [provider]  cetirizine (ZYRTEC) 10 MG tablet Take 10 mg by mouth daily.    [provider]  Chlorhexidine Gluconate Cloth 2 % PADS Apply 6 each topically daily at 6 (six) AM. Patient not taking: Reported on 03/01/2019 05/31/18   Awilda Bill, NP  cinacalcet (SENSIPAR) 30 MG tablet Take 1 tablet (30 mg total) by mouth daily before breakfast. 05/31/18   Awilda Bill, NP  clopidogrel (PLAVIX) 75 MG tablet Take 1 tablet (75 mg total) by mouth daily. 05/31/18   Awilda Bill, NP  docusate sodium (COLACE) 100 MG capsule Take 1 capsule (100 mg total) by mouth 2 (two) times daily. 05/30/18   Awilda Bill, NP  fluticasone (FLONASE) 50 MCG/ACT nasal spray Place 2 sprays into both nostrils 2 (two) times daily.    [provider]  fluticasone furoate-vilanterol (BREO ELLIPTA) 200-25 MCG/INH AEPB Inhale 1 puff into the lungs daily. 05/31/18   Awilda Bill, NP  folic acid (FOLVITE) 1 MG tablet Take 1 mg by mouth daily.    [provider]  lidocaine-prilocaine (EMLA) cream Apply 1 application topically as needed (topical anesthesia for hemodialysis if Gebauers and Lidocaine injection are ineffective.). Patient not taking: Reported on 03/01/2019  05/30/18   Awilda Bill, NP  lubiprostone (AMITIZA) 24 MCG capsule Take 24 mcg by mouth 2 (two) times daily with a meal.    [provider]  meclizine (ANTIVERT) 25 MG tablet Take 25 mg by mouth 3 (three) times daily as needed for dizziness.    [provider]  metoprolol succinate (TOPROL-XL) 100 MG 24 hr tablet Take 1 tablet (100 mg total) by mouth daily with breakfast. Take with or immediately following a meal. 05/31/18   Awilda Bill, NP  multivitamin (RENA-VIT) TABS tablet Take 1 tablet by mouth at bedtime. 05/30/18   Awilda Bill, NP  nitroGLYCERIN (NITROSTAT) 0.4 MG SL tablet Place 1 tablet (0.4 mg total) under the tongue every 5 (five) minutes as needed for chest pain. 05/30/18   Awilda Bill, NP  ondansetron (ZOFRAN) 4 MG tablet Take 1 tablet (4 mg total)  by mouth every 6 (six) hours as needed for nausea. 05/30/18   Awilda Bill, NP  Oxycodone HCl 10 MG TABS Take 10 mg by mouth every 6 (six) hours as needed (pain).    [provider]  pantoprazole (PROTONIX) 40 MG tablet Take 40 mg by mouth 2 (two) times daily.    [provider]  polyethylene glycol (MIRALAX / GLYCOLAX) packet Take 17 g by mouth daily. 05/31/18   Awilda Bill, NP  ranolazine (RANEXA) 500 MG 12 hr tablet Take 500 mg by mouth 2 (two) times daily. 04/13/18   [provider]  sevelamer carbonate (RENVELA) 800 MG tablet Take 1,600 mg by mouth 3 (three) times daily with meals.    [provider]  tiotropium (SPIRIVA HANDIHALER) 18 MCG inhalation capsule Place 1 capsule (18 mcg total) into inhaler and inhale daily. 05/31/18   Awilda Bill, NP  torsemide (DEMADEX) 100 MG tablet Take 100 mg by mouth daily.    [provider]  venlafaxine XR (EFFEXOR-XR) 37.5 MG 24 hr capsule Take 37.5 mg by mouth daily with breakfast.    [provider]    Allergies Nystatin, Sulfa antibiotics, and Niaspan [niacin]  Family History  Problem Relation Age  of Onset  . Cancer Mother   . Cancer Father   . Diabetes Brother   . Heart disease Brother     Social History Social History   Tobacco Use  . Smoking status: Current Every Day Smoker    Packs/day: 0.25    Types: Cigarettes  . Smokeless tobacco: Never Used  Substance Use Topics  . Alcohol use: No    Alcohol/week: 0.0 standard drinks  . Drug use: No    Review of Systems Constitutional: No fever/chills Eyes: No visual changes. ENT: No sore throat. Cardiovascular: Denies chest pain. Respiratory: Denies shortness of breath. Gastrointestinal: No abdominal pain.   Genitourinary: Negative for dysuria. Musculoskeletal: Negative for back pain. Skin: Negative for rash. Neurological: Negative for headaches, areas of focal weakness or numbness.  Does report tremor in both hands and some in her jaw    ____________________________________________   PHYSICAL EXAM:  VITAL SIGNS: ED Triage Vitals  Enc Vitals Group     BP 04/24/19 1543 135/77     Pulse Rate 04/24/19 1543 73     Resp 04/24/19 1543 20     Temp 04/24/19 1543 99.6 F (37.6 C)     Temp Source 04/24/19 1543 Oral     SpO2 04/24/19 1543 95 %     Weight 04/24/19 1527 224 lb (101.6 kg)     Height 04/24/19 1527 5\' 6"  (1.676 m)     Head Circumference --      Peak Flow --      Pain Score 04/24/19 1527 7     Pain Loc --      Pain Edu? --      Excl. in [place]? --     Constitutional: Alert and oriented. Well appearing and in no acute distress. Eyes: Conjunctivae are normal. Head: Atraumatic. Nose: No congestion/rhinnorhea. Mouth/Throat: Mucous membranes are moist. Neck: No stridor.  Cardiovascular: Normal rate, regular rhythm. Grossly normal heart sounds.  Good peripheral circulation.  Left upper extremity thrill present and AV fistula bandaged without bleeding. Respiratory: Normal respiratory effort.  No retractions. Lungs CTAB. Gastrointestinal: Soft and nontender. No distention. Musculoskeletal: No lower extremity  tenderness nor edema. Neurologic:  Normal speech and language. No gross focal neurologic deficits are appreciated.  She has  a mild tremor in both upper extremities and also some slight tremulousness in her jaw, the tremor in her hands does not extinguish with movement and is also present at rest.  She has no ataxia.  She moves all extremities with normal strength.  Cranial nerve exam is normal.  She speaks with full and clear sentences. Skin:  Skin is warm, dry and intact. No rash noted. Psychiatric: Mood and affect are normal. Speech and behavior are normal.  ____________________________________________   LABS (all labs ordered are listed, but only abnormal results are displayed)  Labs Reviewed  BASIC METABOLIC PANEL - Abnormal; Notable for the following components:      Result Value   Sodium 134 (*)    BUN 40 (*)    Creatinine, Ser 5.54 (*)    Calcium 8.1 (*)    GFR calc non Af Amer 7 (*)    GFR calc Af Amer 8 (*)    All other components within normal limits  CBC - Abnormal; Notable for the following components:   RBC 3.44 (*)    Hemoglobin 10.5 (*)    HCT 33.1 (*)    RDW 15.7 (*)    All other components within normal limits  GLUCOSE, CAPILLARY  CBG MONITORING, ED   ____________________________________________  EKG  Reviewed and interpreted by me at 1540 Heart rate 70 QRS 80 QTc 470 Normal sinus rhythm, T wave inversions inferolaterally, these are chronic.  No acute changes compared with previous ____________________________________________  RADIOLOGY   ____________________________________________   PROCEDURES  Procedure(s) performed: None  Procedures  Critical Care performed: No  ____________________________________________   INITIAL IMPRESSION / ASSESSMENT AND PLAN / ED COURSE  Pertinent labs & imaging results that were available during my care of the patient were reviewed by me and considered in my medical decision making (see chart for details).    Lab work reassuring, some hypocalcemia on labs but discussed with nephrology they report this is due to likely patient's dialysis and low albumin would not recommend treatment with calcium.  Patient awake alert with no focal neurologic deficits, tremulousness for about 6 weeks now.  Has plan to see primary tomorrow for follow-up as well.    Case, labs, symptoms, and medication list discussed with Dr. Abigail Butts. Dr. Abigail Butts advises stop IMDUR for now, possible cause. Follow-up with doctor tomorrow as planned at [facility]. Continue current HD schedule.   No signs or symptoms suggest seizure disorder.  Does not appear to be a dystonic reaction.  Discussed and reviewed medications with nephrology, they recommend stop Imdur and follow-up tomorrow with family medicine as planned as well as continued follow-up at each of her hemodialysis sessions.  Patient comfortable and agreeable with plan.  She does take Imdur, discussed with the patient importance of close follow-up, she has appointment tomorrow and will discuss with her primary medicine physician. ____________________________________________   FINAL CLINICAL IMPRESSION(S) / ED DIAGNOSES  Final diagnoses:  Tremor        Note:  This document was prepared using Dragon voice recognition software and may include unintentional dictation errors       Delman Kitten, MD 04/24/19 1809

## 2019-04-24 NOTE — ED Triage Notes (Signed)
Here for tremors since yesterday. On and off for 6 weeks. Pt came from dialysis. Pain to right foot since surgery of amputated toe.  C/o generalized weakness. Alert and oriented.

## 2019-04-24 NOTE — ED Triage Notes (Signed)
FIRST NURSE NOTE-here for shaking after dialysis.  Pt eating plate of food at check in. NAD

## 2019-07-06 ENCOUNTER — Other Ambulatory Visit: Payer: Self-pay

## 2019-07-06 ENCOUNTER — Observation Stay
Admission: EM | Admit: 2019-07-06 | Discharge: 2019-07-08 | Disposition: A | Discharge status: Home / Self Care | Payer: Medicare Other | Attending: Internal Medicine | Admitting: Internal Medicine

## 2019-07-06 ENCOUNTER — Other Ambulatory Visit: Payer: Self-pay | Admitting: Cardiology

## 2019-07-06 ENCOUNTER — Encounter: Payer: Self-pay | Admitting: Emergency Medicine

## 2019-07-06 ENCOUNTER — Emergency Department: Payer: Medicare Other

## 2019-07-06 DIAGNOSIS — Z882 Allergy status to sulfonamides status: Secondary | ICD-10-CM | POA: Insufficient documentation

## 2019-07-06 DIAGNOSIS — Z8673 Personal history of transient ischemic attack (TIA), and cerebral infarction without residual deficits: Secondary | ICD-10-CM | POA: Insufficient documentation

## 2019-07-06 DIAGNOSIS — T8130XA Disruption of wound, unspecified, initial encounter: Secondary | ICD-10-CM | POA: Insufficient documentation

## 2019-07-06 DIAGNOSIS — R072 Precordial pain: Secondary | ICD-10-CM | POA: Diagnosis present

## 2019-07-06 DIAGNOSIS — Z66 Do not resuscitate: Secondary | ICD-10-CM | POA: Diagnosis not present

## 2019-07-06 DIAGNOSIS — Z89421 Acquired absence of other right toe(s): Secondary | ICD-10-CM | POA: Diagnosis not present

## 2019-07-06 DIAGNOSIS — I252 Old myocardial infarction: Secondary | ICD-10-CM | POA: Insufficient documentation

## 2019-07-06 DIAGNOSIS — F17210 Nicotine dependence, cigarettes, uncomplicated: Secondary | ICD-10-CM | POA: Diagnosis not present

## 2019-07-06 DIAGNOSIS — D631 Anemia in chronic kidney disease: Secondary | ICD-10-CM | POA: Diagnosis not present

## 2019-07-06 DIAGNOSIS — E1151 Type 2 diabetes mellitus with diabetic peripheral angiopathy without gangrene: Secondary | ICD-10-CM | POA: Diagnosis not present

## 2019-07-06 DIAGNOSIS — Z6835 Body mass index (BMI) 35.0-35.9, adult: Secondary | ICD-10-CM | POA: Insufficient documentation

## 2019-07-06 DIAGNOSIS — E1122 Type 2 diabetes mellitus with diabetic chronic kidney disease: Secondary | ICD-10-CM | POA: Insufficient documentation

## 2019-07-06 DIAGNOSIS — X58XXXA Exposure to other specified factors, initial encounter: Secondary | ICD-10-CM | POA: Diagnosis not present

## 2019-07-06 DIAGNOSIS — G4733 Obstructive sleep apnea (adult) (pediatric): Secondary | ICD-10-CM | POA: Insufficient documentation

## 2019-07-06 DIAGNOSIS — E1140 Type 2 diabetes mellitus with diabetic neuropathy, unspecified: Secondary | ICD-10-CM | POA: Insufficient documentation

## 2019-07-06 DIAGNOSIS — Z20828 Contact with and (suspected) exposure to other viral communicable diseases: Secondary | ICD-10-CM | POA: Diagnosis not present

## 2019-07-06 DIAGNOSIS — I5032 Chronic diastolic (congestive) heart failure: Secondary | ICD-10-CM | POA: Insufficient documentation

## 2019-07-06 DIAGNOSIS — E669 Obesity, unspecified: Secondary | ICD-10-CM | POA: Diagnosis not present

## 2019-07-06 DIAGNOSIS — I132 Hypertensive heart and chronic kidney disease with heart failure and with stage 5 chronic kidney disease, or end stage renal disease: Secondary | ICD-10-CM | POA: Insufficient documentation

## 2019-07-06 DIAGNOSIS — Z7902 Long term (current) use of antithrombotics/antiplatelets: Secondary | ICD-10-CM | POA: Insufficient documentation

## 2019-07-06 DIAGNOSIS — R079 Chest pain, unspecified: Secondary | ICD-10-CM | POA: Diagnosis present

## 2019-07-06 DIAGNOSIS — E785 Hyperlipidemia, unspecified: Secondary | ICD-10-CM | POA: Diagnosis not present

## 2019-07-06 DIAGNOSIS — Z7982 Long term (current) use of aspirin: Secondary | ICD-10-CM | POA: Insufficient documentation

## 2019-07-06 DIAGNOSIS — N2581 Secondary hyperparathyroidism of renal origin: Secondary | ICD-10-CM | POA: Diagnosis not present

## 2019-07-06 DIAGNOSIS — N186 End stage renal disease: Secondary | ICD-10-CM | POA: Diagnosis not present

## 2019-07-06 DIAGNOSIS — Z881 Allergy status to other antibiotic agents status: Secondary | ICD-10-CM | POA: Insufficient documentation

## 2019-07-06 DIAGNOSIS — I25119 Atherosclerotic heart disease of native coronary artery with unspecified angina pectoris: Secondary | ICD-10-CM | POA: Diagnosis not present

## 2019-07-06 DIAGNOSIS — E7800 Pure hypercholesterolemia, unspecified: Secondary | ICD-10-CM | POA: Diagnosis not present

## 2019-07-06 DIAGNOSIS — Z992 Dependence on renal dialysis: Secondary | ICD-10-CM | POA: Diagnosis not present

## 2019-07-06 DIAGNOSIS — Z955 Presence of coronary angioplasty implant and graft: Secondary | ICD-10-CM | POA: Insufficient documentation

## 2019-07-06 DIAGNOSIS — J449 Chronic obstructive pulmonary disease, unspecified: Secondary | ICD-10-CM | POA: Diagnosis not present

## 2019-07-06 DIAGNOSIS — R9431 Abnormal electrocardiogram [ECG] [EKG]: Secondary | ICD-10-CM | POA: Diagnosis present

## 2019-07-06 DIAGNOSIS — Z833 Family history of diabetes mellitus: Secondary | ICD-10-CM | POA: Insufficient documentation

## 2019-07-06 DIAGNOSIS — Z96653 Presence of artificial knee joint, bilateral: Secondary | ICD-10-CM | POA: Insufficient documentation

## 2019-07-06 DIAGNOSIS — Z7951 Long term (current) use of inhaled steroids: Secondary | ICD-10-CM | POA: Insufficient documentation

## 2019-07-06 DIAGNOSIS — K219 Gastro-esophageal reflux disease without esophagitis: Secondary | ICD-10-CM | POA: Diagnosis not present

## 2019-07-06 DIAGNOSIS — Z8249 Family history of ischemic heart disease and other diseases of the circulatory system: Secondary | ICD-10-CM | POA: Insufficient documentation

## 2019-07-06 DIAGNOSIS — Z79899 Other long term (current) drug therapy: Secondary | ICD-10-CM | POA: Insufficient documentation

## 2019-07-06 DIAGNOSIS — Z951 Presence of aortocoronary bypass graft: Secondary | ICD-10-CM | POA: Insufficient documentation

## 2019-07-06 DIAGNOSIS — Z888 Allergy status to other drugs, medicaments and biological substances status: Secondary | ICD-10-CM | POA: Insufficient documentation

## 2019-07-06 DIAGNOSIS — I209 Angina pectoris, unspecified: Secondary | ICD-10-CM

## 2019-07-06 LAB — COMPREHENSIVE METABOLIC PANEL
ALT: 7 U/L (ref 0–44)
AST: 13 U/L — ABNORMAL LOW (ref 15–41)
Albumin: 3.3 g/dL — ABNORMAL LOW (ref 3.5–5.0)
Alkaline Phosphatase: 102 U/L (ref 38–126)
Anion gap: 17 — ABNORMAL HIGH (ref 5–15)
BUN: 53 mg/dL — ABNORMAL HIGH (ref 8–23)
CO2: 27 mmol/L (ref 22–32)
Calcium: 7.9 mg/dL — ABNORMAL LOW (ref 8.9–10.3)
Chloride: 93 mmol/L — ABNORMAL LOW (ref 98–111)
Creatinine, Ser: 8.95 mg/dL — ABNORMAL HIGH (ref 0.44–1.00)
GFR calc Af Amer: 5 mL/min — ABNORMAL LOW (ref >60)
GFR calc non Af Amer: 4 mL/min — ABNORMAL LOW (ref >60)
Glucose, Bld: 123 mg/dL — ABNORMAL HIGH (ref 70–99)
Potassium: 4.3 mmol/L (ref 3.5–5.1)
Sodium: 137 mmol/L (ref 135–145)
Total Bilirubin: 0.7 mg/dL (ref 0.3–1.2)
Total Protein: 7.6 g/dL (ref 6.5–8.1)

## 2019-07-06 LAB — CBC WITH DIFFERENTIAL/PLATELET
Abs Immature Granulocytes: 0.03 10*3/uL (ref 0.00–0.07)
Basophils Absolute: 0.1 10*3/uL (ref 0.0–0.1)
Basophils Relative: 1 %
Eosinophils Absolute: 0.5 10*3/uL (ref 0.0–0.5)
Eosinophils Relative: 8 %
HCT: 27.8 % — ABNORMAL LOW (ref 36.0–46.0)
Hemoglobin: 8.7 g/dL — ABNORMAL LOW (ref 12.0–15.0)
Immature Granulocytes: 1 %
Lymphocytes Relative: 21 %
Lymphs Abs: 1.3 10*3/uL (ref 0.7–4.0)
MCH: 32 pg (ref 26.0–34.0)
MCHC: 31.3 g/dL (ref 30.0–36.0)
MCV: 102.2 fL — ABNORMAL HIGH (ref 80.0–100.0)
Monocytes Absolute: 0.6 10*3/uL (ref 0.1–1.0)
Monocytes Relative: 9 %
Neutro Abs: 4 10*3/uL (ref 1.7–7.7)
Neutrophils Relative %: 60 %
Platelets: 209 10*3/uL (ref 150–400)
RBC: 2.72 MIL/uL — ABNORMAL LOW (ref 3.87–5.11)
RDW: 15.2 % (ref 11.5–15.5)
WBC: 6.5 10*3/uL (ref 4.0–10.5)
nRBC: 0 % (ref 0.0–0.2)

## 2019-07-06 LAB — LIPID PANEL
Cholesterol: 137 mg/dL (ref 0–200)
HDL: 41 mg/dL (ref >40)
LDL Cholesterol: 73 mg/dL (ref 0–99)
Total CHOL/HDL Ratio: 3.3 RATIO
Triglycerides: 115 mg/dL (ref <150)
VLDL: 23 mg/dL (ref 0–40)

## 2019-07-06 LAB — SARS CORONAVIRUS 2 (TAT 6-24 HRS): SARS Coronavirus 2: NEGATIVE

## 2019-07-06 LAB — HEPARIN LEVEL (UNFRACTIONATED): Heparin Unfractionated: 0.65 IU/mL (ref 0.30–0.70)

## 2019-07-06 LAB — HIV ANTIBODY (ROUTINE TESTING W REFLEX): HIV Screen 4th Generation wRfx: NONREACTIVE

## 2019-07-06 LAB — PROTIME-INR
INR: 1.1 (ref 0.8–1.2)
Prothrombin Time: 13.8 seconds (ref 11.4–15.2)

## 2019-07-06 LAB — BRAIN NATRIURETIC PEPTIDE: B Natriuretic Peptide: 271 pg/mL — ABNORMAL HIGH (ref 0.0–100.0)

## 2019-07-06 LAB — APTT: aPTT: 39 seconds — ABNORMAL HIGH (ref 24–36)

## 2019-07-06 LAB — PHOSPHORUS: Phosphorus: 4.4 mg/dL (ref 2.5–4.6)

## 2019-07-06 LAB — MRSA PCR SCREENING: MRSA by PCR: NEGATIVE

## 2019-07-06 LAB — TROPONIN I (HIGH SENSITIVITY)
Troponin I (High Sensitivity): 16 ng/L (ref <18)
Troponin I (High Sensitivity): 17 ng/L (ref <18)

## 2019-07-06 MED ORDER — SODIUM CHLORIDE 0.9 % IV SOLN: 100.0000 mL | INTRAVENOUS | Status: DC | PRN

## 2019-07-06 MED ORDER — ALTEPLASE 2 MG IJ SOLR
2.0000 mg | Freq: Once | INTRAMUSCULAR | Status: DC | PRN
  Filled 2019-07-06: qty 2

## 2019-07-06 MED ORDER — CINACALCET HCL 30 MG PO TABS
30.0000 mg | ORAL_TABLET | Freq: Every day | ORAL | Status: DC
  Administered 2019-07-07 – 2019-07-08 (×2): 30 mg via ORAL
  Filled 2019-07-06 (×2): qty 1

## 2019-07-06 MED ORDER — ASPIRIN 81 MG PO CHEW
81.0000 mg | CHEWABLE_TABLET | Freq: Every day | ORAL | Status: DC
  Administered 2019-07-06 – 2019-07-08 (×3): 81 mg via ORAL
  Filled 2019-07-06 (×3): qty 1

## 2019-07-06 MED ORDER — HEPARIN BOLUS VIA INFUSION
4000.0000 [IU] | Freq: Once | INTRAVENOUS | Status: AC
  Administered 2019-07-06: 4000 [IU] via INTRAVENOUS
  Filled 2019-07-06: qty 4000

## 2019-07-06 MED ORDER — FOLIC ACID 1 MG PO TABS
1.0000 mg | ORAL_TABLET | Freq: Every day | ORAL | Status: DC
  Administered 2019-07-06 – 2019-07-08 (×3): 1 mg via ORAL
  Filled 2019-07-06 (×3): qty 1

## 2019-07-06 MED ORDER — CLOPIDOGREL BISULFATE 75 MG PO TABS
75.0000 mg | ORAL_TABLET | Freq: Every day | ORAL | Status: DC
  Administered 2019-07-06 – 2019-07-08 (×3): 75 mg via ORAL
  Filled 2019-07-06 (×3): qty 1

## 2019-07-06 MED ORDER — FLUTICASONE PROPIONATE 50 MCG/ACT NA SUSP
2.0000 | Freq: Two times a day (BID) | NASAL | Status: DC
  Administered 2019-07-06 – 2019-07-08 (×4): 2 via NASAL
  Filled 2019-07-06: qty 16

## 2019-07-06 MED ORDER — HEPARIN SODIUM (PORCINE) 1000 UNIT/ML DIALYSIS
1000.0000 [IU] | INTRAMUSCULAR | Status: DC | PRN
  Filled 2019-07-06: qty 1

## 2019-07-06 MED ORDER — CALCIUM CARBONATE ANTACID 500 MG PO CHEW
2.0000 | CHEWABLE_TABLET | Freq: Three times a day (TID) | ORAL | Status: DC
  Administered 2019-07-06 – 2019-07-07 (×3): 400 mg via ORAL
  Filled 2019-07-06 (×5): qty 2

## 2019-07-06 MED ORDER — ACETAMINOPHEN 325 MG PO TABS: 650.0000 mg | ORAL_TABLET | Freq: Four times a day (QID) | ORAL | Status: DC | PRN

## 2019-07-06 MED ORDER — HEPARIN (PORCINE) 25000 UT/250ML-% IV SOLN
1400.0000 [IU]/h | INTRAVENOUS | Status: DC
  Administered 2019-07-06: 1000 [IU]/h via INTRAVENOUS
  Filled 2019-07-06 (×2): qty 250

## 2019-07-06 MED ORDER — METOPROLOL SUCCINATE ER 100 MG PO TB24
100.0000 mg | ORAL_TABLET | Freq: Every day | ORAL | Status: DC
  Administered 2019-07-07 – 2019-07-08 (×2): 100 mg via ORAL
  Filled 2019-07-06 (×2): qty 1

## 2019-07-06 MED ORDER — ATORVASTATIN CALCIUM 20 MG PO TABS
40.0000 mg | ORAL_TABLET | Freq: Every day | ORAL | Status: DC
  Administered 2019-07-06 – 2019-07-07 (×2): 40 mg via ORAL
  Filled 2019-07-06 (×2): qty 2

## 2019-07-06 MED ORDER — TORSEMIDE 20 MG PO TABS
100.0000 mg | ORAL_TABLET | Freq: Every day | ORAL | Status: DC
  Administered 2019-07-07 – 2019-07-08 (×2): 100 mg via ORAL
  Filled 2019-07-06: qty 5
  Filled 2019-07-06: qty 1
  Filled 2019-07-06: qty 5
  Filled 2019-07-06: qty 1

## 2019-07-06 MED ORDER — RENA-VITE PO TABS
1.0000 | ORAL_TABLET | Freq: Every day | ORAL | Status: DC
  Administered 2019-07-06 – 2019-07-07 (×2): 1 via ORAL
  Filled 2019-07-06 (×3): qty 1

## 2019-07-06 MED ORDER — FLUTICASONE FUROATE-VILANTEROL 200-25 MCG/INH IN AEPB: 1.0000 | INHALATION_SPRAY | Freq: Every day | RESPIRATORY_TRACT | Status: DC

## 2019-07-06 MED ORDER — LIDOCAINE-PRILOCAINE 2.5-2.5 % EX CREA
1.0000 "application " | TOPICAL_CREAM | CUTANEOUS | Status: DC | PRN
  Filled 2019-07-06: qty 5

## 2019-07-06 MED ORDER — RANOLAZINE ER 500 MG PO TB12
500.0000 mg | ORAL_TABLET | Freq: Two times a day (BID) | ORAL | Status: DC
  Administered 2019-07-07 – 2019-07-08 (×3): 500 mg via ORAL
  Filled 2019-07-06 (×5): qty 1

## 2019-07-06 MED ORDER — CHLORHEXIDINE GLUCONATE CLOTH 2 % EX PADS
6.0000 | MEDICATED_PAD | Freq: Every day | CUTANEOUS | Status: DC
  Administered 2019-07-07 – 2019-07-08 (×2): 6 via TOPICAL
  Filled 2019-07-06: qty 6

## 2019-07-06 MED ORDER — LUBIPROSTONE 24 MCG PO CAPS
24.0000 ug | ORAL_CAPSULE | Freq: Two times a day (BID) | ORAL | Status: DC
  Administered 2019-07-07 – 2019-07-08 (×3): 24 ug via ORAL
  Filled 2019-07-06 (×5): qty 1

## 2019-07-06 MED ORDER — VENLAFAXINE HCL ER 37.5 MG PO CP24
37.5000 mg | ORAL_CAPSULE | Freq: Every day | ORAL | Status: DC
  Administered 2019-07-07 – 2019-07-08 (×2): 37.5 mg via ORAL
  Filled 2019-07-06 (×2): qty 1

## 2019-07-06 MED ORDER — FUROSEMIDE 10 MG/ML IJ SOLN
100.0000 mg | Freq: Once | INTRAVENOUS | Status: AC
  Administered 2019-07-06: 100 mg via INTRAVENOUS
  Filled 2019-07-06 (×2): qty 10

## 2019-07-06 MED ORDER — ALBUTEROL SULFATE (2.5 MG/3ML) 0.083% IN NEBU: 3.0000 mL | INHALATION_SOLUTION | Freq: Four times a day (QID) | RESPIRATORY_TRACT | Status: DC | PRN

## 2019-07-06 MED ORDER — LORATADINE 10 MG PO TABS
10.0000 mg | ORAL_TABLET | Freq: Every day | ORAL | Status: DC
  Administered 2019-07-06 – 2019-07-08 (×3): 10 mg via ORAL
  Filled 2019-07-06 (×3): qty 1

## 2019-07-06 MED ORDER — ONDANSETRON HCL 4 MG PO TABS
4.0000 mg | ORAL_TABLET | Freq: Four times a day (QID) | ORAL | Status: DC | PRN
  Filled 2019-07-06: qty 1

## 2019-07-06 MED ORDER — SEVELAMER CARBONATE 800 MG PO TABS
2400.0000 mg | ORAL_TABLET | Freq: Three times a day (TID) | ORAL | Status: DC
  Administered 2019-07-07 – 2019-07-08 (×4): 2400 mg via ORAL
  Filled 2019-07-06 (×5): qty 3

## 2019-07-06 MED ORDER — OXYCODONE HCL 5 MG PO TABS
10.0000 mg | ORAL_TABLET | Freq: Four times a day (QID) | ORAL | Status: DC | PRN
  Administered 2019-07-06 – 2019-07-07 (×3): 10 mg via ORAL
  Filled 2019-07-06 (×3): qty 2

## 2019-07-06 MED ORDER — MECLIZINE HCL 25 MG PO TABS
25.0000 mg | ORAL_TABLET | Freq: Three times a day (TID) | ORAL | Status: DC | PRN
  Filled 2019-07-06: qty 1

## 2019-07-06 MED ORDER — POLYETHYLENE GLYCOL 3350 17 G PO PACK
17.0000 g | PACK | Freq: Every day | ORAL | Status: DC
  Administered 2019-07-06 – 2019-07-07 (×2): 17 g via ORAL
  Filled 2019-07-06 (×3): qty 1

## 2019-07-06 MED ORDER — LIDOCAINE HCL (PF) 1 % IJ SOLN
5.0000 mL | INTRAMUSCULAR | Status: DC | PRN
  Filled 2019-07-06: qty 5

## 2019-07-06 MED ORDER — PANTOPRAZOLE SODIUM 40 MG PO TBEC
40.0000 mg | DELAYED_RELEASE_TABLET | Freq: Two times a day (BID) | ORAL | Status: DC
  Administered 2019-07-06 – 2019-07-08 (×5): 40 mg via ORAL
  Filled 2019-07-06 (×5): qty 1

## 2019-07-06 MED ORDER — ISOSORBIDE MONONITRATE ER 30 MG PO TB24
30.0000 mg | ORAL_TABLET | Freq: Every day | ORAL | Status: DC
  Administered 2019-07-06 – 2019-07-08 (×3): 30 mg via ORAL
  Filled 2019-07-06 (×3): qty 1

## 2019-07-06 MED ORDER — DOCUSATE SODIUM 100 MG PO CAPS
100.0000 mg | ORAL_CAPSULE | Freq: Two times a day (BID) | ORAL | Status: DC
  Administered 2019-07-06 – 2019-07-08 (×5): 100 mg via ORAL
  Filled 2019-07-06 (×5): qty 1

## 2019-07-06 MED ORDER — AMLODIPINE BESYLATE 10 MG PO TABS
10.0000 mg | ORAL_TABLET | Freq: Every day | ORAL | Status: DC
  Administered 2019-07-06 – 2019-07-08 (×3): 10 mg via ORAL
  Filled 2019-07-06: qty 1
  Filled 2019-07-06: qty 2
  Filled 2019-07-06: qty 1

## 2019-07-06 MED ORDER — ALBUTEROL SULFATE (2.5 MG/3ML) 0.083% IN NEBU: 2.5000 mg | INHALATION_SOLUTION | RESPIRATORY_TRACT | Status: DC | PRN

## 2019-07-06 MED ORDER — CINACALCET HCL 30 MG PO TABS: 30.0000 mg | ORAL_TABLET | Freq: Every day | ORAL | Status: DC

## 2019-07-06 MED ORDER — HEPARIN (PORCINE) 25000 UT/250ML-% IV SOLN: 10.0000 [IU]/kg/h | INTRAVENOUS | Status: DC

## 2019-07-06 MED ORDER — TIOTROPIUM BROMIDE MONOHYDRATE 18 MCG IN CAPS
1.0000 | ORAL_CAPSULE | Freq: Every day | RESPIRATORY_TRACT | Status: DC
  Administered 2019-07-07 – 2019-07-08 (×2): 18 ug via RESPIRATORY_TRACT
  Filled 2019-07-06: qty 5

## 2019-07-06 MED ORDER — MOMETASONE FURO-FORMOTEROL FUM 200-5 MCG/ACT IN AERO
2.0000 | INHALATION_SPRAY | Freq: Two times a day (BID) | RESPIRATORY_TRACT | Status: DC
  Administered 2019-07-06 – 2019-07-08 (×4): 2 via RESPIRATORY_TRACT
  Filled 2019-07-06 (×2): qty 8.8

## 2019-07-06 MED ORDER — PENTAFLUOROPROP-TETRAFLUOROETH EX AERO
1.0000 "application " | INHALATION_SPRAY | CUTANEOUS | Status: DC | PRN
  Filled 2019-07-06: qty 30

## 2019-07-06 MED ORDER — DOCUSATE SODIUM 100 MG PO CAPS: 100.0000 mg | ORAL_CAPSULE | Freq: Two times a day (BID) | ORAL | Status: DC | PRN

## 2019-07-06 MED ORDER — HEPARIN SODIUM (PORCINE) 5000 UNIT/ML IJ SOLN: 4000.0000 [IU] | Freq: Once | INTRAMUSCULAR | Status: DC

## 2019-07-06 NOTE — Consult Note (Addendum)
Cardiology Consultation Note    Patient ID: Kimberly Shaffer, MRN: [id number], DOB/AGE: [birth year] 68 y.o. Admit date: 07/06/2019   Date of Consult: 07/06/2019 Primary Physician: Clinic, [facility] Primary Cardiologist: Dr. Mancel Parsons, [facility]   Chief Complaint: chest pain Reason for Consultation: chest pain Requesting MD: Dr. Anselm Jungling  HPI: Kimberly Shaffer is a 68 y.o. female with history of peripheral vascular disease, a.  BMS to LCX in 1997.      b.  POBA to RCA in 1998.      c.  BMS to LCX in 2002.      d.  Cath in 2009, 100% RCA, 50% LAD and significant disease in the LCX.      e.  CABG x 1 in 2012     F. LHC 06/2016: EF 40% w/ inferior hypokinesis, severe 3v disease with patent stent of distal LM to LAD and pLcx. Occluded RCA with good collaterals to PDA and new stenosis of mid LAD w/o current ischemia to anterior wall by stress test. Significant stenosis of LAD but no current ischemia to anterior wall by stress test  .  She also has peripheral vascular disease s/p right profunda to popliteal bypass. .  She has end-stage renal disease on hemodialysis.  She had chest pain during dialysis today.  She states the pain was also present last night.  Symptoms have resolved.  Had chest pain at [place] last week treated medially Troponinn slightly elevated there.  Her BNP is 271 which is improved from previous value.  Troponin x2 was normal.  EKG shows sinus rhythm with LVH and repolarization changes.  No change from baseline.  Chest x-ray revealed no acute cardiopulmonary disease.  Currently pain-free and hemodynamically stable.  Past Medical History:  Diagnosis Date  . Asthma   . CAD (coronary artery disease)   . Chronic lower back pain   . COPD (chronic obstructive pulmonary disease) ([place])   . Depression   . Diabetes mellitus without complication (HCC)    NIDD  . H/O angina pectoris   . H/O blood clots   . Hypercholesteremia   . Hypertension   . Left rotator cuff tear   . Myocardial  infarction ([place])   . Obesity   . Renal insufficiency   . Stroke ([place])   . Vertigo, aural       Surgical History:  Past Surgical History:  Procedure Laterality Date  . A/V FISTULAGRAM Left 03/14/2017   Procedure: A/V Fistulagram;  Surgeon: Katha Cabal, MD;  Location: [place] [facility];  Service: Cardiovascular;  Laterality: Left;  . AV FISTULA PLACEMENT Left 11-04-2015  . AV FISTULA PLACEMENT Left 11/04/2015   Procedure: ARTERIOVENOUS (AV) FISTULA CREATION  ( BRACHIAL CEPHALIC );  Surgeon: Katha Cabal, MD;  Location: [facility];  Service: Vascular;  Laterality: Left;  . CARDIAC CATHETERIZATION    . CARDIAC CATHETERIZATION Left 07/19/2016   Procedure: Left Heart Cath and Coronary Angiography;  Surgeon: Corey Skains, MD;  Location: [place] [facility];  Service: Cardiovascular;  Laterality: Left;  . CORONARY ARTERY BYPASS GRAFT  2012  . CORONARY STENT PLACEMENT    . JOINT REPLACEMENT    . KNEE SURGERY Bilateral   . LEFT HEART CATH AND CORONARY ANGIOGRAPHY N/A 12/25/2017   Procedure: LEFT HEART CATH AND CORONARY ANGIOGRAPHY;  Surgeon: Teodoro Spray, MD;  Location: [place] [facility];  Service: Cardiovascular;  Laterality: N/A;  . LEFT HEART CATH AND CORONARY ANGIOGRAPHY Right 03/23/2018  Procedure: LEFT HEART CATH AND CORONARY ANGIOGRAPHY;  Surgeon: Dionisio David, MD;  Location: [place] [facility];  Service: Cardiovascular;  Laterality: Right;  . LEFT HEART CATH AND CORONARY ANGIOGRAPHY N/A 05/30/2018   Procedure: LEFT HEART CATH AND CORONARY ANGIOGRAPHY;  Surgeon: Corey Skains, MD;  Location: [place] [facility];  Service: Cardiovascular;  Laterality: N/A;  . PERIPHERAL VASCULAR CATHETERIZATION N/A 11/13/2015   Procedure: Dialysis/Perma Catheter Insertion;  Surgeon: Katha Cabal, MD;  Location: [place] [facility];  Service: Cardiovascular;  Laterality: N/A;  . PERIPHERAL VASCULAR CATHETERIZATION N/A 12/04/2015   Procedure: Dialysis/Perma Catheter Insertion;   Surgeon: Katha Cabal, MD;  Location: [place] [facility];  Service: Cardiovascular;  Laterality: N/A;  . PERIPHERAL VASCULAR CATHETERIZATION Left 12/31/2015   Procedure: A/V Shuntogram/Fistulagram;  Surgeon: Algernon Huxley, MD;  Location: [place] [facility];  Service: Cardiovascular;  Laterality: Left;  . PERIPHERAL VASCULAR CATHETERIZATION N/A 12/31/2015   Procedure: A/V Shunt Intervention;  Surgeon: Algernon Huxley, MD;  Location: [place] [facility];  Service: Cardiovascular;  Laterality: N/A;  . PERIPHERAL VASCULAR CATHETERIZATION Left 02/25/2016   Procedure: A/V Shuntogram/Fistulagram;  Surgeon: Algernon Huxley, MD;  Location: [place] [facility];  Service: Cardiovascular;  Laterality: Left;  . PERIPHERAL VASCULAR CATHETERIZATION N/A 03/18/2016   Procedure: Dialysis/Perma Catheter Removal;  Surgeon: Katha Cabal, MD;  Location: [place] [facility];  Service: Cardiovascular;  Laterality: N/A;  . REMOVAL OF A DIALYSIS CATHETER N/A 06/30/2017   Procedure: REMOVAL OF A DIALYSIS CATHETER;  Surgeon: Katha Cabal, MD;  Location: [facility];  Service: Vascular;  Laterality: N/A;  . REPLACEMENT TOTAL KNEE BILATERAL Bilateral      Home Meds: Prior to Admission medications   Medication Sig Start Date End Date Taking? Authorizing Provider  acetaminophen (TYLENOL) 325 MG tablet Take 2 tablets (650 mg total) by mouth every 6 (six) hours as needed for mild pain (or Fever >/= 101). 05/30/18  Yes Awilda Bill, NP  albuterol (PROVENTIL) (2.5 MG/3ML) 0.083% nebulizer solution Take 3 mLs (2.5 mg total) by nebulization every 4 (four) hours as needed for wheezing or shortness of breath. 05/30/18  Yes Awilda Bill, NP  albuterol (VENTOLIN HFA) 108 (90 Base) MCG/ACT inhaler Inhale 1 puff into the lungs every 6 (six) hours as needed for wheezing or shortness of breath.   Yes [provider]  amLODipine (NORVASC) 10 MG tablet Take 10 mg by mouth daily.   Yes [provider]  aspirin 81  MG chewable tablet Chew 81 mg by mouth daily.   Yes [provider]  atorvastatin (LIPITOR) 40 MG tablet Take 40 mg by mouth at bedtime. 03/28/18  Yes [provider]  budesonide-formoterol (SYMBICORT) 160-4.5 MCG/ACT inhaler Inhale 2 puffs into the lungs 2 (two) times daily.   Yes [provider]  calcium carbonate (TUMS - DOSED IN MG ELEMENTAL CALCIUM) 500 MG chewable tablet Chew 2 tablets by mouth 3 (three) times daily.   Yes [provider]  cetirizine (ZYRTEC) 10 MG tablet Take 10 mg by mouth daily.   Yes [provider]  cinacalcet (SENSIPAR) 30 MG tablet Take 1 tablet (30 mg total) by mouth daily before breakfast. 05/31/18  Yes Awilda Bill, NP  clopidogrel (PLAVIX) 75 MG tablet Take 1 tablet (75 mg total) by mouth daily. 05/31/18  Yes Awilda Bill, NP  docusate sodium (COLACE) 100 MG capsule Take 1 capsule (100 mg total) by mouth 2 (two) times  daily. 05/30/18  Yes Awilda Bill, NP  fluticasone (FLONASE) 50 MCG/ACT nasal spray Place 2 sprays into both nostrils 2 (two) times daily.   Yes [provider]  fluticasone furoate-vilanterol (BREO ELLIPTA) 200-25 MCG/INH AEPB Inhale 1 puff into the lungs daily. 05/31/18  Yes Awilda Bill, NP  folic acid (FOLVITE) 1 MG tablet Take 1 mg by mouth daily.   Yes [provider]  isosorbide mononitrate (IMDUR) 30 MG 24 hr tablet Take 30 mg by mouth daily.   Yes [provider]  lidocaine-prilocaine (EMLA) cream Apply 1 application topically as needed (topical anesthesia for hemodialysis if Gebauers and Lidocaine injection are ineffective.). 05/30/18  Yes Awilda Bill, NP  lubiprostone (AMITIZA) 24 MCG capsule Take 24 mcg by mouth 2 (two) times daily with a meal.   Yes [provider]  meclizine (ANTIVERT) 25 MG tablet Take 25 mg by mouth 3 (three) times daily as needed for dizziness.   Yes [provider]  metoprolol succinate (TOPROL-XL) 100 MG 24 hr  tablet Take 1 tablet (100 mg total) by mouth daily with breakfast. Take with or immediately following a meal. 05/31/18  Yes Awilda Bill, NP  multivitamin (RENA-VIT) TABS tablet Take 1 tablet by mouth at bedtime. 05/30/18  Yes Awilda Bill, NP  ondansetron (ZOFRAN) 4 MG tablet Take 1 tablet (4 mg total) by mouth every 6 (six) hours as needed for nausea. 05/30/18  Yes Awilda Bill, NP  Oxycodone HCl 10 MG TABS Take 10 mg by mouth every 6 (six) hours as needed (pain).   Yes [provider]  pantoprazole (PROTONIX) 40 MG tablet Take 40 mg by mouth 2 (two) times daily.   Yes [provider]  polyethylene glycol (MIRALAX / GLYCOLAX) packet Take 17 g by mouth daily. 05/31/18  Yes Awilda Bill, NP  ranolazine (RANEXA) 500 MG 12 hr tablet Take 500 mg by mouth 2 (two) times daily. 04/13/18  Yes [provider]  sevelamer carbonate (RENVELA) 800 MG tablet Take 2,400 mg by mouth 3 (three) times daily with meals.    Yes [provider]  tiotropium (SPIRIVA HANDIHALER) 18 MCG inhalation capsule Place 1 capsule (18 mcg total) into inhaler and inhale daily. 05/31/18  Yes Awilda Bill, NP  torsemide (DEMADEX) 100 MG tablet Take 100 mg by mouth daily.   Yes [provider]  venlafaxine XR (EFFEXOR-XR) 37.5 MG 24 hr capsule Take 37.5 mg by mouth daily with breakfast.   Yes [provider]    Inpatient Medications:  . Chlorhexidine Gluconate Cloth  6 each Topical Q0600   . heparin 1,000 Units/hr (07/06/19 1054)    Allergies:  Allergies  Allergen Reactions  . Nystatin Hives and Itching  . Sulfa Antibiotics Swelling, Hives and Rash  . Niaspan [Niacin] Other (See Comments) and Hives    Social History   Socioeconomic History  . Marital status: Widowed    Spouse name: Not on file  . Number of children: Not on file  . Years of education: Not on file  . Highest education level: Not on file  Occupational History  . Occupation: retired   Scientific laboratory technician  . Financial resource strain: Not on file  . Food insecurity    Worry: Not on file    Inability: Not on file  . Transportation needs    Medical: Not on file    Non-medical: Not on file  Tobacco Use  . Smoking status: Current Every Day Smoker  Packs/day: 0.25    Types: Cigarettes  . Smokeless tobacco: Never Used  Substance and Sexual Activity  . Alcohol use: No    Alcohol/week: 0.0 standard drinks  . Drug use: No  . Sexual activity: Yes  Lifestyle  . Physical activity    Days per week: Not on file    Minutes per session: Not on file  . Stress: Not on file  Relationships  . Social Herbalist on phone: Not on file    Gets together: Not on file    Attends religious service: Not on file    Active member of club or organization: Not on file    Attends meetings of clubs or organizations: Not on file    Relationship status: Not on file  . Intimate partner violence    Fear of current or ex partner: Not on file    Emotionally abused: Not on file    Physically abused: Not on file    Forced sexual activity: Not on file  Other Topics Concern  . Not on file  Social History Narrative  . Not on file     Family History  Problem Relation Age of Onset  . Cancer Mother   . Cancer Father   . Diabetes Brother   . Heart disease Brother      Review of Systems: A 12-system review of systems was performed and is negative except as noted in the HPI.  Labs: No results for input(s): CKTOTAL, CKMB, TROPONINI in the last 72 hours. Lab Results  Component Value Date   WBC 6.5 07/06/2019   HGB 8.7 (L) 07/06/2019   HCT 27.8 (L) 07/06/2019   MCV 102.2 (H) 07/06/2019   PLT 209 07/06/2019    Recent Labs  Lab 07/06/19 0756  NA 137  K 4.3  CL 93*  CO2 27  BUN 53*  CREATININE 8.95*  CALCIUM 7.9*  PROT 7.6  BILITOT 0.7  ALKPHOS 102  ALT 7  AST 13*  GLUCOSE 123*   Lab Results  Component Value Date   CHOL 137 07/06/2019   HDL 41 07/06/2019   LDLCALC  73 07/06/2019   TRIG 115 07/06/2019   No results found for: DDIMER  Radiology/Studies:  Dg Chest Portable 1 View  Result Date: 07/06/2019 CLINICAL DATA:  Chest pain EXAM: PORTABLE CHEST 1 VIEW COMPARISON:  11/27/2018 FINDINGS: Mild bibasilar scarring/atelectasis. No focal consolidation. No pleural effusion or pneumothorax. The heart is top-normal in size.  Thoracic aortic atherosclerosis. IMPRESSION: No evidence of acute cardiopulmonary disease. Electronically Signed   By: Julian Hy M.D.   On: 07/06/2019 08:26    Wt Readings from Last 3 Encounters:  07/06/19 100.2 kg  04/24/19 101.6 kg  03/01/19 99.8 kg    EKG: Electrocardiogram showed sinus rhythm with LVH  Physical Exam:  Blood pressure 130/64, pulse 66, temperature 98 F (36.7 C), temperature source Oral, resp. rate 16, height 5\' 6"  (1.676 m), weight 100.2 kg, SpO2 100 %. Body mass index is 35.67 kg/m. General: Well developed, well nourished, in no acute distress. Head: Normocephalic, atraumatic, sclera non-icteric, no xanthomas, nares are without discharge.  Neck: Negative for carotid bruits. JVD not elevated. Lungs: Clear bilaterally to auscultation without wheezes, rales, or rhonchi. Breathing is unlabored. Heart: RRR with S1 S2. No murmurs, rubs, or gallops appreciated. Abdomen: Soft, non-tender, non-distended with normoactive bowel sounds. No hepatomegaly. No rebound/guarding. No obvious abdominal masses. Msk:  Strength and tone appear normal for age. Extremities: No clubbing or  cyanosis. No edema.  Distal pedal pulses are 2+ and equal bilaterally. Neuro: Alert and oriented X 3. No facial asymmetry. No focal deficit. Moves all extremities spontaneously. Psych:  Responds to questions appropriately with a normal affect.     Assessment and Plan  68 year old female with history of coronary disease and peripheral vascular disease followed both at [facility] who presented to the emergency room with episode of chest  pain last p.m. and again today during hemodialysis.  Was brought to the emergency room.  She was recently admitted to [facility] and discharged 2 days ago. She underwent a right profunda to popliteal bpass. She had chest pain during this admission and had mild hstrop to 61. No ekg changes. Started back on ranlolazine. She is still smoking.  high-sensitivity troponin were negative x2.  Patient is currently pain-free.  She was started on heparin per ER physician. .  We will continue with current regimen pending transfer.  Per patient request.  Would continue with aspirin and Plavix.  We will continue with amlodipine, atorvastatin, isosorbide mononitrate as well as metoprolol at current dose.  Continue to rule out for myocardial infarction.  Echo done at [facility] last week revealed preserved LV function EF 55%.  Will continue with ranexa and iv heparin.   End-stage renal disease continue with hemodialysis.  Peripheral vascular disease-s/p right profunda to popliteal bypass last week at [facility].    Ivin Booty MD 07/06/2019, 11:32 AM Pager: [phone]

## 2019-07-06 NOTE — ED Notes (Addendum)
Pt given [place] sandwich tray- pt sitting on side of the bed

## 2019-07-06 NOTE — Progress Notes (Addendum)
HD Tx Initiated:  AVG accessed with needle gage 15. Tx initiated with no complications.    07/06/19 2138  Vital Signs  Temp 98.7 F (37.1 C)  Temp Source Oral  Pulse Rate 71  Pulse Rate Source Dinamap  Resp 16  BP 124/63  BP Location Right Arm  BP Method Automatic  Patient Position (if appropriate) Lying  Oxygen Therapy  SpO2 96 %  O2 Device Nasal Cannula  O2 Flow Rate (L/min) 2 L/min  Pain Assessment  Pain Scale 0-10  Pain Score 0  Dialysis Weight  Weight 98.7 kg  Type of Weight Pre-Dialysis  During Hemodialysis Assessment  Blood Flow Rate (mL/min) 400 mL/min  Arterial Pressure (mmHg) -100 mmHg  Venous Pressure (mmHg) 160 mmHg  Transmembrane Pressure (mmHg) 80 mmHg  Ultrafiltration Rate (mL/min) 570 mL/min  Dialysate Flow Rate (mL/min) 800 ml/min  Conductivity: Machine  13.9  HD Safety Checks Performed Yes  Dialysis Fluid Bolus Normal Saline  Bolus Amount (mL) 250 mL  Intra-Hemodialysis Comments Tx initiated

## 2019-07-06 NOTE — Progress Notes (Signed)
Patient to undergo hemodialysis today.  Orders have been prepared.  We will continue to monitor her progress.

## 2019-07-06 NOTE — Progress Notes (Signed)
ANTICOAGULATION CONSULT NOTE - Initial Consult  Pharmacy Consult for Heparin  Indication: chest pain/ACS  Allergies  Allergen Reactions  . Nystatin Hives and Itching  . Sulfa Antibiotics Swelling, Hives and Rash  . Niaspan [Niacin] Other (See Comments) and Hives    Patient Measurements: Height: 5\' 6"  (167.6 cm) Weight: 221 lb (100.2 kg) IBW/kg (Calculated) : 59.3 Heparin Dosing Weight: 82 kg   Vital Signs: Temp: 98 F (36.7 C) (10/17 0741) Temp Source: Oral (10/17 0741) BP: 130/64 (10/17 1100) Pulse Rate: 66 (10/17 1100)  Labs: Recent Labs    07/06/19 0756 07/06/19 1039  HGB 8.7*  --   HCT 27.8*  --   PLT 209  --   APTT 39*  --   LABPROT 13.8  --   INR 1.1  --   CREATININE 8.95*  --   TROPONINIHS 16 17    Estimated Creatinine Clearance: 7.2 mL/min (A) (by C-G formula based on SCr of 8.95 mg/dL (H)).    Assessment: 68 yo female here with chest pain starting on heparin drip. No oral anticoagulants noted on PTA med list.    Goal of Therapy:  Heparin level 0.3-0.7 units/ml Monitor platelets by anticoagulation protocol: Yes   Plan:  Heparin bolus 4000 units IV x1 then heparin drip at 1000 units/hr (=10 ml/hr) Heparin level in 8h CBC in AM  Pharmacy will continue to follow.   Rayna Sexton L 07/06/2019,12:02 PM

## 2019-07-06 NOTE — ED Notes (Signed)
Pt calling for help to pick up candy from floor- assisted pt- pt has no other complaints at this time

## 2019-07-06 NOTE — ED Notes (Signed)
Attempted IV access x 2 by this RN, each time infiltrated.

## 2019-07-06 NOTE — H&P (Signed)
[place] [facility] [place] NAME: Kimberly Shaffer    MR#:  [id number]  DATE OF BIRTH:  08/25/1951  DATE OF ADMISSION:  07/06/2019  PRIMARY CARE PHYSICIAN: [facility]   REQUESTING/REFERRING PHYSICIAN: malinda  CHIEF COMPLAINT:   Chief Complaint  Patient presents with  . Chest Pain    HISTORY OF PRESENT ILLNESS: Kimberly Shaffer  is a 68 y.o. female with a known history of CAD< COPD, ESRD on HD, hypertension, obesity, peripheral arterial disease-recent vascular bypass surgery on right lower extremity at [facility]. She had some tightness in her left side of chest last night around 2 when she was sleeping.  She woke up with the pain.  She denies any associated palpitation or shortness of breath with that and the pain lasted for 20 to 30 minutes and she went back to sleep.  In the morning around 5:30 she had her dialysis chart all came to pick her up at that time she again felt some pain, without any palpitation or shortness of breath.  She went to the dialysis center and talk to the nurse about this.  They gave her some aspirin tablets which helped to relieve the pain and they decided to send her to emergency room because of this complaint. When I saw in the morning patient did not had any chest pain.  Her troponin was negative but she had some new EKG changes as per ER physician and he had started on heparin IV drip.  PAST MEDICAL HISTORY:   Past Medical History:  Diagnosis Date  . Asthma   . CAD (coronary artery disease)   . Chronic lower back pain   . COPD (chronic obstructive pulmonary disease) ([place])   . Depression   . Diabetes mellitus without complication (HCC)    NIDD  . H/O angina pectoris   . H/O blood clots   . Hypercholesteremia   . Hypertension   . Left rotator cuff tear   . Myocardial infarction ([place])   . Obesity   . Renal insufficiency   . Stroke ([place])   . Vertigo, aural     PAST SURGICAL HISTORY:  Past Surgical History:   Procedure Laterality Date  . A/V FISTULAGRAM Left 03/14/2017   Procedure: A/V Fistulagram;  Surgeon: Katha Cabal, MD;  Location: [place] [facility];  Service: Cardiovascular;  Laterality: Left;  . AV FISTULA PLACEMENT Left 11-04-2015  . AV FISTULA PLACEMENT Left 11/04/2015   Procedure: ARTERIOVENOUS (AV) FISTULA CREATION  ( BRACHIAL CEPHALIC );  Surgeon: Katha Cabal, MD;  Location: [facility];  Service: Vascular;  Laterality: Left;  . CARDIAC CATHETERIZATION    . CARDIAC CATHETERIZATION Left 07/19/2016   Procedure: Left Heart Cath and Coronary Angiography;  Surgeon: Corey Skains, MD;  Location: [place] [facility];  Service: Cardiovascular;  Laterality: Left;  . CORONARY ARTERY BYPASS GRAFT  2012  . CORONARY STENT PLACEMENT    . JOINT REPLACEMENT    . KNEE SURGERY Bilateral   . LEFT HEART CATH AND CORONARY ANGIOGRAPHY N/A 12/25/2017   Procedure: LEFT HEART CATH AND CORONARY ANGIOGRAPHY;  Surgeon: Teodoro Spray, MD;  Location: [place] [facility];  Service: Cardiovascular;  Laterality: N/A;  . LEFT HEART CATH AND CORONARY ANGIOGRAPHY Right 03/23/2018   Procedure: LEFT HEART CATH AND CORONARY ANGIOGRAPHY;  Surgeon: Dionisio David, MD;  Location: [place] [facility];  Service: Cardiovascular;  Laterality: Right;  . LEFT HEART CATH AND CORONARY ANGIOGRAPHY N/A  05/30/2018   Procedure: LEFT HEART CATH AND CORONARY ANGIOGRAPHY;  Surgeon: Corey Skains, MD;  Location: [place] [facility];  Service: Cardiovascular;  Laterality: N/A;  . PERIPHERAL VASCULAR CATHETERIZATION N/A 11/13/2015   Procedure: Dialysis/Perma Catheter Insertion;  Surgeon: Katha Cabal, MD;  Location: [place] [facility];  Service: Cardiovascular;  Laterality: N/A;  . PERIPHERAL VASCULAR CATHETERIZATION N/A 12/04/2015   Procedure: Dialysis/Perma Catheter Insertion;  Surgeon: Katha Cabal, MD;  Location: [place] [facility];  Service: Cardiovascular;  Laterality: N/A;  . PERIPHERAL VASCULAR  CATHETERIZATION Left 12/31/2015   Procedure: A/V Shuntogram/Fistulagram;  Surgeon: Algernon Huxley, MD;  Location: [place] [facility];  Service: Cardiovascular;  Laterality: Left;  . PERIPHERAL VASCULAR CATHETERIZATION N/A 12/31/2015   Procedure: A/V Shunt Intervention;  Surgeon: Algernon Huxley, MD;  Location: [place] [facility];  Service: Cardiovascular;  Laterality: N/A;  . PERIPHERAL VASCULAR CATHETERIZATION Left 02/25/2016   Procedure: A/V Shuntogram/Fistulagram;  Surgeon: Algernon Huxley, MD;  Location: [place] [facility];  Service: Cardiovascular;  Laterality: Left;  . PERIPHERAL VASCULAR CATHETERIZATION N/A 03/18/2016   Procedure: Dialysis/Perma Catheter Removal;  Surgeon: Katha Cabal, MD;  Location: [place] [facility];  Service: Cardiovascular;  Laterality: N/A;  . REMOVAL OF A DIALYSIS CATHETER N/A 06/30/2017   Procedure: REMOVAL OF A DIALYSIS CATHETER;  Surgeon: Katha Cabal, MD;  Location: [facility];  Service: Vascular;  Laterality: N/A;  . REPLACEMENT TOTAL KNEE BILATERAL Bilateral     SOCIAL HISTORY:  Social History   Tobacco Use  . Smoking status: Current Every Day Smoker    Packs/day: 0.25    Types: Cigarettes  . Smokeless tobacco: Never Used  Substance Use Topics  . Alcohol use: No    Alcohol/week: 0.0 standard drinks    FAMILY HISTORY:  Family History  Problem Relation Age of Onset  . Cancer Mother   . Cancer Father   . Diabetes Brother   . Heart disease Brother     DRUG ALLERGIES:  Allergies  Allergen Reactions  . Nystatin Hives and Itching  . Sulfa Antibiotics Swelling, Hives and Rash  . Niaspan [Niacin] Other (See Comments) and Hives    REVIEW OF SYSTEMS:   CONSTITUTIONAL: No fever, fatigue or weakness.  EYES: No blurred or double vision.  EARS, NOSE, AND THROAT: No tinnitus or ear pain.  RESPIRATORY: No cough, shortness of breath, wheezing or hemoptysis.  CARDIOVASCULAR: She have chest pain, no orthopnea, edema.  GASTROINTESTINAL: No nausea,  vomiting, diarrhea or abdominal pain.  GENITOURINARY: No dysuria, hematuria.  ENDOCRINE: No polyuria, nocturia,  HEMATOLOGY: No anemia, easy bruising or bleeding SKIN: No rash or lesion. MUSCULOSKELETAL: No joint pain or arthritis.   NEUROLOGIC: No tingling, numbness, weakness.  PSYCHIATRY: No anxiety or depression.   MEDICATIONS AT HOME:  Prior to Admission medications   Medication Sig Start Date End Date Taking? Authorizing Provider  acetaminophen (TYLENOL) 325 MG tablet Take 2 tablets (650 mg total) by mouth every 6 (six) hours as needed for mild pain (or Fever >/= 101). 05/30/18  Yes Awilda Bill, NP  albuterol (PROVENTIL) (2.5 MG/3ML) 0.083% nebulizer solution Take 3 mLs (2.5 mg total) by nebulization every 4 (four) hours as needed for wheezing or shortness of breath. 05/30/18  Yes Awilda Bill, NP  albuterol (VENTOLIN HFA) 108 (90 Base) MCG/ACT inhaler Inhale 1 puff into the lungs every 6 (six) hours as needed for wheezing or shortness of breath.   Yes [provider]  amLODipine (NORVASC) 10 MG tablet Take 10 mg by mouth daily.   Yes [provider]  aspirin 81 MG chewable tablet Chew 81 mg by mouth daily.   Yes [provider]  atorvastatin (LIPITOR) 40 MG tablet Take 40 mg by mouth at bedtime. 03/28/18  Yes [provider]  budesonide-formoterol (SYMBICORT) 160-4.5 MCG/ACT inhaler Inhale 2 puffs into the lungs 2 (two) times daily.   Yes [provider]  calcium carbonate (TUMS - DOSED IN MG ELEMENTAL CALCIUM) 500 MG chewable tablet Chew 2 tablets by mouth 3 (three) times daily.   Yes [provider]  cetirizine (ZYRTEC) 10 MG tablet Take 10 mg by mouth daily.   Yes [provider]  cinacalcet (SENSIPAR) 30 MG tablet Take 1 tablet (30 mg total) by mouth daily before breakfast. 05/31/18  Yes Awilda Bill, NP  clopidogrel (PLAVIX) 75 MG tablet Take 1 tablet (75 mg total) by mouth daily. 05/31/18  Yes Awilda Bill,  NP  docusate sodium (COLACE) 100 MG capsule Take 1 capsule (100 mg total) by mouth 2 (two) times daily. 05/30/18  Yes Awilda Bill, NP  fluticasone (FLONASE) 50 MCG/ACT nasal spray Place 2 sprays into both nostrils 2 (two) times daily.   Yes [provider]  fluticasone furoate-vilanterol (BREO ELLIPTA) 200-25 MCG/INH AEPB Inhale 1 puff into the lungs daily. 05/31/18  Yes Awilda Bill, NP  folic acid (FOLVITE) 1 MG tablet Take 1 mg by mouth daily.   Yes [provider]  isosorbide mononitrate (IMDUR) 30 MG 24 hr tablet Take 30 mg by mouth daily.   Yes [provider]  lidocaine-prilocaine (EMLA) cream Apply 1 application topically as needed (topical anesthesia for hemodialysis if Gebauers and Lidocaine injection are ineffective.). 05/30/18  Yes Awilda Bill, NP  lubiprostone (AMITIZA) 24 MCG capsule Take 24 mcg by mouth 2 (two) times daily with a meal.   Yes [provider]  meclizine (ANTIVERT) 25 MG tablet Take 25 mg by mouth 3 (three) times daily as needed for dizziness.   Yes [provider]  metoprolol succinate (TOPROL-XL) 100 MG 24 hr tablet Take 1 tablet (100 mg total) by mouth daily with breakfast. Take with or immediately following a meal. 05/31/18  Yes Awilda Bill, NP  multivitamin (RENA-VIT) TABS tablet Take 1 tablet by mouth at bedtime. 05/30/18  Yes Awilda Bill, NP  ondansetron (ZOFRAN) 4 MG tablet Take 1 tablet (4 mg total) by mouth every 6 (six) hours as needed for nausea. 05/30/18  Yes Awilda Bill, NP  Oxycodone HCl 10 MG TABS Take 10 mg by mouth every 6 (six) hours as needed (pain).   Yes [provider]  pantoprazole (PROTONIX) 40 MG tablet Take 40 mg by mouth 2 (two) times daily.   Yes [provider]  polyethylene glycol (MIRALAX / GLYCOLAX) packet Take 17 g by mouth daily. 05/31/18  Yes Awilda Bill, NP  ranolazine (RANEXA) 500 MG 12 hr tablet Take 500 mg by mouth 2 (two) times daily. 04/13/18   Yes [provider]  sevelamer carbonate (RENVELA) 800 MG tablet Take 2,400 mg by mouth 3 (three) times daily with meals.    Yes [provider]  tiotropium (SPIRIVA HANDIHALER) 18 MCG inhalation capsule Place 1 capsule (18 mcg total) into inhaler and inhale daily. 05/31/18  Yes Awilda Bill, NP  torsemide (DEMADEX) 100 MG tablet Take 100 mg by mouth daily.   Yes [provider]  venlafaxine  XR (EFFEXOR-XR) 37.5 MG 24 hr capsule Take 37.5 mg by mouth daily with breakfast.   Yes [provider]      PHYSICAL EXAMINATION:   VITAL SIGNS: Blood pressure (!) 115/52, pulse 61, temperature 98 F (36.7 C), temperature source Oral, resp. rate 20, height 5\' 6"  (1.676 m), weight 100.2 kg, SpO2 99 %.  GENERAL:  68 y.o.-year-old patient lying in the bed with no acute distress.  EYES: Pupils equal, round, reactive to light and accommodation. No scleral icterus. Extraocular muscles intact.  HEENT: Head atraumatic, normocephalic. Oropharynx and nasopharynx clear.  NECK:  Supple, no jugular venous distention. No thyroid enlargement, no tenderness.  LUNGS: Normal breath sounds bilaterally, no wheezing, rales,rhonchi or crepitation. No use of accessory muscles of respiration.  CARDIOVASCULAR: S1, S2 normal. No murmurs, rubs, or gallops.  ABDOMEN: Soft, nontender, nondistended. Bowel sounds present. No organomegaly or mass.  EXTREMITIES: No pedal edema, cyanosis, or clubbing.  There is a recent surgery on right lower extremity in the wound extending from mid of calf until her inguinal region on the right, upper end of the wound in her thigh has some open staples with yellowish looking pus in the opening.  She is not tender, red, warm around the wound. NEUROLOGIC: Cranial nerves II through XII are intact. Muscle strength 5/5 in all extremities. Sensation intact. Gait not checked.  PSYCHIATRIC: The patient is alert and oriented x 3.  SKIN: No obvious rash, lesion, or ulcer.      LABORATORY PANEL:   CBC Recent Labs  Lab 07/06/19 0756  WBC 6.5  HGB 8.7*  HCT 27.8*  PLT 209  MCV 102.2*  MCH 32.0  MCHC 31.3  RDW 15.2  LYMPHSABS 1.3  MONOABS 0.6  EOSABS 0.5  BASOSABS 0.1   ------------------------------------------------------------------------------------------------------------------  Chemistries  Recent Labs  Lab 07/06/19 0756  NA 137  K 4.3  CL 93*  CO2 27  GLUCOSE 123*  BUN 53*  CREATININE 8.95*  CALCIUM 7.9*  AST 13*  ALT 7  ALKPHOS 102  BILITOT 0.7   ------------------------------------------------------------------------------------------------------------------ estimated creatinine clearance is 7.2 mL/min (A) (by C-G formula based on SCr of 8.95 mg/dL (H)). ------------------------------------------------------------------------------------------------------------------ No results for input(s): TSH, T4TOTAL, T3FREE, THYROIDAB in the last 72 hours.  Invalid input(s): FREET3   Coagulation profile Recent Labs  Lab 07/06/19 0756  INR 1.1   ------------------------------------------------------------------------------------------------------------------- No results for input(s): DDIMER in the last 72 hours. -------------------------------------------------------------------------------------------------------------------  Cardiac Enzymes No results for input(s): CKMB, TROPONINI, MYOGLOBIN in the last 168 hours.  Invalid input(s): CK ------------------------------------------------------------------------------------------------------------------ Invalid input(s): POCBNP  ---------------------------------------------------------------------------------------------------------------  Urinalysis    Component Value Date/Time   COLORURINE YELLOW (A) 06/19/2018 0824   APPEARANCEUR CLEAR (A) 06/19/2018 0824   APPEARANCEUR Clear 07/21/2013 0807   LABSPEC 1.011 06/19/2018 0824   LABSPEC 1.013 07/21/2013 0807   PHURINE 7.0  06/19/2018 0824   GLUCOSEU 150 (A) 06/19/2018 0824   GLUCOSEU Negative 07/21/2013 0807   HGBUR NEGATIVE 06/19/2018 0824   BILIRUBINUR NEGATIVE 06/19/2018 0824   BILIRUBINUR Negative 07/21/2013 0807   KETONESUR NEGATIVE 06/19/2018 0824   PROTEINUR 100 (A) 06/19/2018 0824   NITRITE NEGATIVE 06/19/2018 0824   LEUKOCYTESUR NEGATIVE 06/19/2018 0824   LEUKOCYTESUR Negative 07/21/2013 0807     RADIOLOGY: Dg Chest Portable 1 View  Result Date: 07/06/2019 CLINICAL DATA:  Chest pain EXAM: PORTABLE CHEST 1 VIEW COMPARISON:  11/27/2018 FINDINGS: Mild bibasilar scarring/atelectasis. No focal consolidation. No pleural effusion or pneumothorax. The heart is top-normal in size.  Thoracic aortic atherosclerosis. IMPRESSION:  No evidence of acute cardiopulmonary disease. Electronically Signed   By: Julian Hy M.D.   On: 07/06/2019 08:26    EKG: Orders placed or performed during the hospital encounter of 07/06/19  . ED EKG  . ED EKG  . EKG 12-Lead  . EKG 12-Lead    IMPRESSION AND PLAN:  *Chest pain Troponin is negative, started on heparin IV drip because of typical pain. Cardiology consult is called in. Monitor on telemetry. Further management as per cardiologist.  *History of coronary artery disease Continue cardiac meds at home dose.  *Recent vascular surgery- PAD Her staples on the upper thigh wound is open with some necrotic looking tissue in there. She does not look to have acute infection there. I will call vascular surgeon to assess this.   *End-stage renal disease on hemodialysis Nephrologist is consulted to continue dialysis here.  *Hypertension Continue home medications.  *COPD Currently no acute exacerbation.  Continue home inhalers.  All the records are reviewed and case discussed with ED provider. Management plans discussed with the patient, family and they are in agreement.  CODE STATUS: DNR    Code Status Orders  (From admission, onward)          Start     Ordered   07/06/19 1352  Do not attempt resuscitation (DNR)  Continuous    Question Answer Comment  In the event of cardiac or respiratory ARREST Do not call a "code blue"   In the event of cardiac or respiratory ARREST Do not perform Intubation, CPR, defibrillation or ACLS   In the event of cardiac or respiratory ARREST Use medication by any route, position, wound care, and other measures to relive pain and suffering. May use oxygen, suction and manual treatment of airway obstruction as needed for comfort.   Comments nurse may pronounce      07/06/19 1351        Code Status History    Date Active Date Inactive Code Status Order ID Comments User Context   06/19/2018 0854 06/21/2018 2113 DNR [id number]  Arta Silence, MD ED   05/29/2018 0618 05/30/2018 2328 DNR [id number]  Harrie Foreman, MD ED   04/18/2018 0250 04/19/2018 1925 DNR [id number]  Harrie Foreman, MD ED   03/23/2018 0955 03/23/2018 2048 DNR [id number]  Loletha Grayer, MD Inpatient   03/23/2018 0500 03/23/2018 0955 Full Code [id number]  Arta Silence, MD Inpatient   03/20/2018 0605 03/21/2018 1313 Full Code [id number]  Harrie Foreman, MD Inpatient   03/02/2018 0313 03/03/2018 1952 Full Code [id number]  Lance Coon, MD ED   02/26/2018 0716 02/27/2018 1821 Full Code [id number]  Arta Silence, MD Inpatient   01/11/2018 2030 01/14/2018 1509 Full Code [id number]  Saundra Shelling, MD Inpatient   12/25/2017 0835 12/25/2017 1743 Full Code [id number]  Teodoro Spray, MD Inpatient   12/23/2017 0927 12/25/2017 0834 Full Code [id number]  Harrie Foreman, MD Inpatient   12/08/2017 0357 12/08/2017 2344 Full Code [id number]  Harrie Foreman, MD Inpatient   06/28/2017 0749 07/01/2017 2218 Full Code [id number]  Harrie Foreman, MD Inpatient   07/19/2016 0857 07/19/2016 1319 Full Code [id number]  Corey Skains, MD Inpatient   07/05/2016 2236 07/09/2016 1950 Full Code [id number]  Harvie Bridge, DO Inpatient   12/17/2015 [id number] 12/20/2015  1957 Full Code [id number]  Saundra Shelling, MD Inpatient   Advance Care Planning Activity       TOTAL TIME TAKING CARE OF THIS PATIENT: 55 minutes.    Vaughan Basta M.D  on 07/06/2019   Between 7am to 6pm - Pager - [phone]  After 6pm go to [email or web address] - password [email or web address] [place]  Office  [phone]  CC: Primary care physician; [facility]   Note: This dictation was prepared with Dragon dictation along with smaller phrase technology. Any transcriptional errors that result from this process are unintentional.

## 2019-07-06 NOTE — ED Notes (Signed)
Pt heparin pump alarming- repositioned IV and retaped- heparin freely running with no leaking around IV site

## 2019-07-06 NOTE — Progress Notes (Signed)
Family Meeting Note  Advance Directive: no  Today a meeting took place with the Patient.  The following clinical team members were present during this meeting:MD  The following were discussed:Patient's diagnosis: Coronary artery disease, end-stage renal disease, peripheral arterial disease, hypertension, recent vascular surgery, Patient's progosis: Unable to determine and Goals for treatment: DNR  Patient told me that in any adverse events she would like her daughter to make decisions for her.  She would not like to be resuscitated or kept alive on the ventilator machines in any adverse event.  I explained the concept of DNR to her.  She wishes to be DNR. I encouraged her to discuss that with her family also, to let them know her wishes..  Additional follow-up to be provided: Cardiology, nephrology, vascular surgery  Time spent during discussion:20 minutes  Kimberly Basta, MD

## 2019-07-06 NOTE — ED Notes (Signed)
This RN transported pt to room 236

## 2019-07-06 NOTE — Care Management Obs Status (Signed)
[place] NOTIFICATION   Patient Details  Name: Kimberly Shaffer MRN: [id number] Date of Birth: 02/05/1951   Medicare Observation Status Notification Given:  Yes Patient is currently on isolation.   Marshell Garfinkel, RN 07/06/2019, 2:21 PM

## 2019-07-06 NOTE — ED Provider Notes (Signed)
[facility] Emergency Department Provider Note   ____________________________________________   First MD Initiated Contact with Patient 07/06/19 0730     (approximate)  I have reviewed the triage vital signs and the nursing notes.   HISTORY  Chief Complaint Chest Pain  Chief complaint is chest pain  HPI Kimberly Shaffer is a 68 y.o. female who has a history of a prior MI.  She reports she had chest pain last night lasted approximately half an hour and then she got up and went to dialysis and had some more chest pain prior to getting dialysis and was sent here.  The pain was heavy felt like her last heart attack radiate into the arm with nausea and vomiting and little bit of shortness of breath.  Symptoms are better now.  The second time the pain lasted for about an hour.  Patient was not really exerting herself either time when this happened.  The pain was not worse with deep breathing.  Patient is also had a recent arterial bypass in the right leg.  She reports she can feel the blood flowing in it.   Patient had 4 baby aspirin's with EMS.     Past Medical History:  Diagnosis Date  . Asthma   . CAD (coronary artery disease)   . Chronic lower back pain   . COPD (chronic obstructive pulmonary disease) ([place])   . Depression   . Diabetes mellitus without complication (HCC)    NIDD  . H/O angina pectoris   . H/O blood clots   . Hypercholesteremia   . Hypertension   . Left rotator cuff tear   . Myocardial infarction ([place])   . Obesity   . Renal insufficiency   . Stroke ([place])   . Vertigo, aural     Patient Active Problem List   Diagnosis Date Noted  . Acute on chronic respiratory failure with hypoxemia ([place]) 06/19/2018  . Acute respiratory failure ([place]) 06/19/2018  . Acute on chronic congestive heart failure ([place])   . ESRD on hemodialysis ([place])   . Palliative care by specialist   . Acute respiratory failure with hypoxemia ([place]) 04/18/2018  . Anemia  of chronic disease 03/24/2018  . NSTEMI (non-ST elevated myocardial infarction) ([place]) 03/23/2018  . Acute on chronic diastolic CHF (congestive heart failure) ([place]) 03/02/2018  . GERD (gastroesophageal reflux disease) 03/02/2018  . Respiratory failure ([place]) 01/11/2018  . Abdominal pain 06/28/2017  . Anxiety, generalized 06/27/2017  . COPD (chronic obstructive pulmonary disease) ([place]) 06/26/2017  . Peritonitis ([place]) 05/28/2017  . Bilateral carotid artery stenosis 03/08/2017  . ESRD on dialysis ([place]) 03/08/2017  . Non-ST elevation myocardial infarction (NSTEMI), subendocardial infarction, subsequent episode of care ([place]) 11/04/2016  . Onychogryphosis 10/31/2016  . Onychomycosis 10/31/2016  . Subungual exostosis 10/31/2016  . Type 2 diabetes mellitus with diabetic neuropathy ([place]) 10/31/2016  . Chronic osteomyelitis of left hand ([place]) 09/03/2016  . Left arm swelling 08/30/2016  . Pre-operative clearance 08/30/2016  . Coronary artery disease of native artery of native heart with stable angina pectoris ([place]) 08/03/2016  . Benign paroxysmal positional vertigo due to bilateral vestibular disorder 07/26/2016  . H/O: CVA (cerebrovascular accident) 07/26/2016  . Stable angina ([place]) 07/05/2016  . Sepsis due to pneumonia ([place]) 07/05/2016  . Fracture of left foot 04/26/2016  . Pre-transplant evaluation for kidney transplant 02/04/2016  . Chest pain at rest 12/17/2015  . Chest pain 12/17/2015  . DJD of shoulder 10/21/2015  . Perianal lesion 05/20/2015  .  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 Lumbar radiculopathy 05/14/2015  . 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 Benign essential hypertension 04/22/2015  . Grief 03/18/2015  . Lumbosacral facet joint syndrome 02/16/2015  . Greater trochanteric bursitis 02/16/2015  . DDD (degenerative disc disease), lumbosacral 01/20/2015  . 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 Osteoarthritis 01/20/2015  . IDA  (iron deficiency anemia) 03/25/2014  . Thyroid nodule 01/15/2014  . Lung nodule 08/28/2013  . Tobacco abuse 05/17/2013  . Mixed hyperlipidemia 06/14/2011  . OSA on CPAP 06/14/2011  . PAD (peripheral artery disease) ([place]) 06/14/2011  . Depression 05/05/2011  . Chronic kidney disease (CKD), stage V ([place]) 03/26/2011  . Multiple vessel coronary artery disease 03/26/2011  . Obesity, unspecified 03/26/2011  . Type 2 diabetes mellitus ([place]) 03/26/2011    Past Surgical History:  Procedure Laterality Date  . A/V FISTULAGRAM Left 03/14/2017   Procedure: A/V Fistulagram;  Surgeon: Katha Cabal, MD;  Location: [place] [facility];  Service: Cardiovascular;  Laterality: Left;  . AV FISTULA PLACEMENT Left 11-04-2015  . AV FISTULA PLACEMENT Left 11/04/2015   Procedure: ARTERIOVENOUS (AV) FISTULA CREATION  ( BRACHIAL CEPHALIC );  Surgeon: Katha Cabal, MD;  Location: [facility];  Service: Vascular;  Laterality: Left;  . CARDIAC CATHETERIZATION    . CARDIAC CATHETERIZATION Left 07/19/2016   Procedure: Left Heart Cath and Coronary Angiography;  Surgeon: Corey Skains, MD;  Location: [place] [facility];  Service: Cardiovascular;  Laterality: Left;  . CORONARY ARTERY BYPASS GRAFT  2012  . CORONARY STENT PLACEMENT    . JOINT REPLACEMENT    . KNEE SURGERY Bilateral   . LEFT HEART CATH AND CORONARY ANGIOGRAPHY N/A 12/25/2017   Procedure: LEFT HEART CATH AND CORONARY ANGIOGRAPHY;  Surgeon: Teodoro Spray, MD;  Location: [place] [facility];  Service: Cardiovascular;  Laterality: N/A;  . LEFT HEART CATH AND CORONARY ANGIOGRAPHY Right 03/23/2018   Procedure: LEFT HEART CATH AND CORONARY ANGIOGRAPHY;  Surgeon: Dionisio David, MD;  Location: [place] [facility];  Service: Cardiovascular;  Laterality: Right;  . LEFT HEART CATH AND CORONARY ANGIOGRAPHY N/A 05/30/2018   Procedure: LEFT HEART CATH AND CORONARY ANGIOGRAPHY;  Surgeon: Corey Skains, MD;  Location: [place] [facility];  Service:  Cardiovascular;  Laterality: N/A;  . PERIPHERAL VASCULAR CATHETERIZATION N/A 11/13/2015   Procedure: Dialysis/Perma Catheter Insertion;  Surgeon: Katha Cabal, MD;  Location: [place] [facility];  Service: Cardiovascular;  Laterality: N/A;  . PERIPHERAL VASCULAR CATHETERIZATION N/A 12/04/2015   Procedure: Dialysis/Perma Catheter Insertion;  Surgeon: Katha Cabal, MD;  Location: [place] [facility];  Service: Cardiovascular;  Laterality: N/A;  . PERIPHERAL VASCULAR CATHETERIZATION Left 12/31/2015   Procedure: A/V Shuntogram/Fistulagram;  Surgeon: Algernon Huxley, MD;  Location: [place] [facility];  Service: Cardiovascular;  Laterality: Left;  . PERIPHERAL VASCULAR CATHETERIZATION N/A 12/31/2015   Procedure: A/V Shunt Intervention;  Surgeon: Algernon Huxley, MD;  Location: [place] [facility];  Service: Cardiovascular;  Laterality: N/A;  . PERIPHERAL VASCULAR CATHETERIZATION Left 02/25/2016   Procedure: A/V Shuntogram/Fistulagram;  Surgeon: Algernon Huxley, MD;  Location: [place] [facility];  Service: Cardiovascular;  Laterality: Left;  . PERIPHERAL VASCULAR CATHETERIZATION N/A 03/18/2016   Procedure: Dialysis/Perma Catheter Removal;  Surgeon: Katha Cabal, MD;  Location: [place] [facility];  Service: Cardiovascular;  Laterality: N/A;  . REMOVAL OF A DIALYSIS CATHETER N/A 06/30/2017   Procedure: REMOVAL OF A DIALYSIS  CATHETER;  Surgeon: Katha Cabal, MD;  Location: [facility];  Service: Vascular;  Laterality: N/A;  . REPLACEMENT TOTAL KNEE BILATERAL Bilateral     Prior to Admission medications   Medication Sig Start Date End Date Taking? Authorizing Provider  acetaminophen (TYLENOL) 325 MG tablet Take 2 tablets (650 mg total) by mouth every 6 (six) hours as needed for mild pain (or Fever >/= 101). 05/30/18   Awilda Bill, NP  albuterol (PROVENTIL) (2.5 MG/3ML) 0.083% nebulizer solution Take 3 mLs (2.5 mg total) by nebulization every 4 (four) hours as needed for wheezing or shortness of  breath. 05/30/18   Awilda Bill, NP  alteplase (CATHFLO ACTIVASE) 2 MG injection 2 mg by Intracatheter route once as needed for open catheter. 05/30/18   Awilda Bill, NP  amLODipine (NORVASC) 10 MG tablet Take 10 mg by mouth daily.    [provider]  aspirin 81 MG chewable tablet Chew 81 mg by mouth daily.    [provider]  atorvastatin (LIPITOR) 40 MG tablet Take 40 mg by mouth at bedtime. 03/28/18   [provider]  budesonide-formoterol (SYMBICORT) 160-4.5 MCG/ACT inhaler Inhale 2 puffs into the lungs 2 (two) times daily.    [provider]  calcium carbonate (TUMS - DOSED IN MG ELEMENTAL CALCIUM) 500 MG chewable tablet Chew 2 tablets by mouth 3 (three) times daily.    [provider]  cetirizine (ZYRTEC) 10 MG tablet Take 10 mg by mouth daily.    [provider]  Chlorhexidine Gluconate Cloth 2 % PADS Apply 6 each topically daily at 6 (six) AM. Patient not taking: Reported on 03/01/2019 05/31/18   Awilda Bill, NP  cinacalcet (SENSIPAR) 30 MG tablet Take 1 tablet (30 mg total) by mouth daily before breakfast. 05/31/18   Awilda Bill, NP  clopidogrel (PLAVIX) 75 MG tablet Take 1 tablet (75 mg total) by mouth daily. 05/31/18   Awilda Bill, NP  docusate sodium (COLACE) 100 MG capsule Take 1 capsule (100 mg total) by mouth 2 (two) times daily. 05/30/18   Awilda Bill, NP  fluticasone (FLONASE) 50 MCG/ACT nasal spray Place 2 sprays into both nostrils 2 (two) times daily.    [provider]  fluticasone furoate-vilanterol (BREO ELLIPTA) 200-25 MCG/INH AEPB Inhale 1 puff into the lungs daily. 05/31/18   Awilda Bill, NP  folic acid (FOLVITE) 1 MG tablet Take 1 mg by mouth daily.    [provider]  lidocaine-prilocaine (EMLA) cream Apply 1 application topically as needed (topical anesthesia for hemodialysis if Gebauers and Lidocaine injection are ineffective.). Patient not taking: Reported on 03/01/2019  05/30/18   Awilda Bill, NP  lubiprostone (AMITIZA) 24 MCG capsule Take 24 mcg by mouth 2 (two) times daily with a meal.    [provider]  meclizine (ANTIVERT) 25 MG tablet Take 25 mg by mouth 3 (three) times daily as needed for dizziness.    [provider]  metoprolol succinate (TOPROL-XL) 100 MG 24 hr tablet Take 1 tablet (100 mg total) by mouth daily with breakfast. Take with or immediately following a meal. 05/31/18   Awilda Bill, NP  multivitamin (RENA-VIT) TABS tablet Take 1 tablet by mouth at bedtime. 05/30/18   Awilda Bill, NP  nitroGLYCERIN (NITROSTAT) 0.4 MG SL tablet Place 1 tablet (0.4 mg total) under the tongue every 5 (five) minutes as needed for chest pain. 05/30/18   Awilda Bill, NP  ondansetron [facility]) 4  MG tablet Take 1 tablet (4 mg total) by mouth every 6 (six) hours as needed for nausea. 05/30/18   Awilda Bill, NP  Oxycodone HCl 10 MG TABS Take 10 mg by mouth every 6 (six) hours as needed (pain).    [provider]  pantoprazole (PROTONIX) 40 MG tablet Take 40 mg by mouth 2 (two) times daily.    [provider]  polyethylene glycol (MIRALAX / GLYCOLAX) packet Take 17 g by mouth daily. 05/31/18   Awilda Bill, NP  ranolazine (RANEXA) 500 MG 12 hr tablet Take 500 mg by mouth 2 (two) times daily. 04/13/18   [provider]  sevelamer carbonate (RENVELA) 800 MG tablet Take 1,600 mg by mouth 3 (three) times daily with meals.    [provider]  tiotropium (SPIRIVA HANDIHALER) 18 MCG inhalation capsule Place 1 capsule (18 mcg total) into inhaler and inhale daily. 05/31/18   Awilda Bill, NP  torsemide (DEMADEX) 100 MG tablet Take 100 mg by mouth daily.    [provider]  venlafaxine XR (EFFEXOR-XR) 37.5 MG 24 hr capsule Take 37.5 mg by mouth daily with breakfast.    [provider]    Allergies Nystatin, Sulfa antibiotics, and Niaspan [niacin]  Family History  Problem Relation Age  of Onset  . Cancer Mother   . Cancer Father   . Diabetes Brother   . Heart disease Brother     Social History Social History   Tobacco Use  . Smoking status: Current Every Day Smoker    Packs/day: 0.25    Types: Cigarettes  . Smokeless tobacco: Never Used  Substance Use Topics  . Alcohol use: No    Alcohol/week: 0.0 standard drinks  . Drug use: No    Review of Systems  Constitutional: No fever/chills Eyes: No visual changes. ENT: No sore throat. Cardiovascular: Denies chest pain currently. Respiratory: Denies shortness of breath currently. Gastrointestinal: No abdominal pain.  No nausea, no vomiting.  No diarrhea.  No constipation. Genitourinary: Negative for dysuria. Musculoskeletal: Negative for back pain. Skin: Negative for rash. Neurological: Negative for headaches, focal weakness  ____________________________________________   PHYSICAL EXAM:  VITAL SIGNS: ED Triage Vitals  Enc Vitals Group     BP      Pulse      Resp      Temp      Temp src      SpO2      Weight      Height      Head Circumference      Peak Flow      Pain Score      Pain Loc      Pain Edu?      Excl. in [place]?     Constitutional: Alert and oriented. Well appearing and in no acute distress. Eyes: Conjunctivae are normal.  Head: Atraumatic. Nose: No congestion/rhinnorhea. Mouth/Throat: Mucous membranes are moist.  Oropharynx non-erythematous. Neck: No stridor.   Cardiovascular: Normal rate, regular rhythm. Grossly normal heart sounds.  Good peripheral circulation. Respiratory: Normal respiratory effort.  No retractions. Lungs CTAB. Gastrointestinal: Soft and nontender. No distention. No abdominal bruits. No CVA tenderness. Musculoskeletal: No lower extremity tenderness right leg still has a staples in it from the bypass or supposed come out on the 27th.  Leg is slightly swollen and warm.  This appears to be consistent with recent arterial bypass.  There is no tenderness. Neurologic:   Normal speech and language. No gross focal neurologic deficits  are appreciated. No gait instability. Skin:  Skin is warm, dry and intact. No rash noted.   ____________________________________________   LABS (all labs ordered are listed, but only abnormal results are displayed)  Labs Reviewed  COMPREHENSIVE METABOLIC PANEL - Abnormal; Notable for the following components:      Result Value   Chloride 93 (*)    Glucose, Bld 123 (*)    BUN 53 (*)    Creatinine, Ser 8.95 (*)    Calcium 7.9 (*)    Albumin 3.3 (*)    AST 13 (*)    GFR calc non Af Amer 4 (*)    GFR calc Af Amer 5 (*)    Anion gap 17 (*)    All other components within normal limits  CBC WITH DIFFERENTIAL/PLATELET - Abnormal; Notable for the following components:   RBC 2.72 (*)    Hemoglobin 8.7 (*)    HCT 27.8 (*)    MCV 102.2 (*)    All other components within normal limits  APTT - Abnormal; Notable for the following components:   aPTT 39 (*)    All other components within normal limits  BRAIN NATRIURETIC PEPTIDE - Abnormal; Notable for the following components:   B Natriuretic Peptide 271.0 (*)    All other components within normal limits  SARS CORONAVIRUS 2 (TAT 6-24 HRS)  PROTIME-INR  TROPONIN I (HIGH SENSITIVITY)   ____________________________________________  EKG  EKG read interpreted by me shows normal sinus rhythm rate of 68 normal axis flipped T's in 1 and L which have been present previously.  There is ST segment depression in V4 with flipped T waves throughout the precordial leads which are new even since the EMS EKGs.  Patient is not currently having any pain ____________________________________________  RADIOLOGY  ED MD interpretation: Chest x-ray read by me looks like CHF.  This is consistent with her not having dialysis today and her BNP of 271  Official radiology report(s): Dg Chest Portable 1 View  Result Date: 07/06/2019 CLINICAL DATA:  Chest pain EXAM: PORTABLE CHEST 1 VIEW COMPARISON:   11/27/2018 FINDINGS: Mild bibasilar scarring/atelectasis. No focal consolidation. No pleural effusion or pneumothorax. The heart is top-normal in size.  Thoracic aortic atherosclerosis. IMPRESSION: No evidence of acute cardiopulmonary disease. Electronically Signed   By: Julian Hy M.D.   On: 07/06/2019 08:26    ____________________________________________   PROCEDURES  Procedure(s) performed (including Critical Care): Critical care time half an hour.  This includes reevaluating the patient several times comparing her EKGs to her old EKGs in the record and through the EMS EKGs and speaking to the hospitalist.  Procedures   ____________________________________________   INITIAL IMPRESSION / [place] / ED COURSE  Patient with about an hour of chest pain at dialysis prior to receiving dialysis now with new ST-T wave changes.  We will get her in the hospital start her on heparin.      WANIA KUTNER was evaluated in Emergency Department on 07/06/2019 for the symptoms described in the history of present illness. She was evaluated in the context of the global COVID-19 pandemic, which necessitated consideration that the patient might be at risk for infection with the SARS-CoV-2 virus that causes COVID-19. Institutional protocols and algorithms that pertain to the evaluation of patients at risk for COVID-19 are in a state of rapid change based on information released by regulatory bodies including the CDC and federal and state organizations. These policies and algorithms were followed during the patient's care  in the ED.*      ____________________________________________   FINAL CLINICAL IMPRESSION(S) / ED DIAGNOSES  Final diagnoses:  Precordial pain  Abnormal EKG     ED Discharge Orders    None       Note:  This document was prepared using Dragon voice recognition software and may include unintentional dictation errors.    Nena Polio, MD 07/06/19 [phone]

## 2019-07-06 NOTE — Progress Notes (Addendum)
ANTICOAGULATION CONSULT NOTE - Initial Consult  Pharmacy Consult for Heparin  Indication: chest pain/ACS  Allergies  Allergen Reactions  . Nystatin Hives and Itching  . Sulfa Antibiotics Swelling, Hives and Rash  . Niaspan [Niacin] Other (See Comments) and Hives    Patient Measurements: Height: 5\' 6"  (167.6 cm) Weight: 217 lb 11.2 oz (98.7 kg) IBW/kg (Calculated) : 59.3 Heparin Dosing Weight: 82 kg   Vital Signs: Temp: 98.4 F (36.9 C) (10/17 1702) Temp Source: Oral (10/17 1702) BP: 129/66 (10/17 1702) Pulse Rate: 73 (10/17 1702)  Labs: Recent Labs    07/06/19 0756 07/06/19 1039 07/06/19 1827  HGB 8.7*  --   --   HCT 27.8*  --   --   PLT 209  --   --   APTT 39*  --   --   LABPROT 13.8  --   --   INR 1.1  --   --   HEPARINUNFRC  --   --  0.65  CREATININE 8.95*  --   --   TROPONINIHS 16 17  --     Estimated Creatinine Clearance: 7.1 mL/min (A) (by C-G formula based on SCr of 8.95 mg/dL (H)).  Assessment: 68 yo female here with chest pain starting on heparin drip. No oral anticoagulants noted on PTA med list.   Goal of Therapy:  Heparin level 0.3-0.7 units/ml Monitor platelets by anticoagulation protocol: Yes   Plan:  Started Heparin bolus 4000 units IV x1 then heparin drip at 1000 units/hr (=10 ml/hr)  10/17 @ 1827 HL = 0.65 therapeutic x 1  Will continue current rate Heparin level in 6h CBC in AM  Pharmacy will continue to follow.   Lu Duffel, PharmD, BCPS Clinical Pharmacist 07/06/2019 7:24 PM

## 2019-07-06 NOTE — ED Notes (Signed)
Pt resting quietly at this time, eyes closed respirations equal and unlabored.

## 2019-07-06 NOTE — TOC Initial Note (Signed)
Transition of Care [facility]) - Initial/Assessment Note    Patient Details  Name: Kimberly Shaffer MRN: [id number] Date of Birth: May 18, 1951  Transition of [facility]) CM/SW Contact:    Marshell Garfinkel, RN Phone Number: 07/06/2019, 2:30 PM  Clinical Narrative:                 RNCM spoke with patient by phone in ED as she is undergoing Covid screening. She is from home with her childrenCharlett Shaffer [id number]. She ambulates with rolling walker. She has chronic O2 through [facility]. She has home health services through [facility]. SHe goes to hemodialysis at [place]. [address] [place], [place] Sat. I explained [place] letter and that her hemodialysis will not be covered under Obs status. She had hoped to transfer to [facility] as she recently had cardiac procedure there. She denies chest pain at this time. [place] with [facility] updated.   Expected Discharge Plan: [place]     Patient Goals and CMS Choice Patient states their goals for this hospitalization and ongoing recovery are:: "I"d like to transfer to [facility]" CMS Medicare.gov Compare Post Acute Care list provided to:: Patient    Expected Discharge Plan and Services Expected Discharge Plan: [place]                                              Prior Living Arrangements/Services   Lives with:: Adult Children              Current home services: DME, Home RN, Home PT    Activities of Daily Living      Permission Sought/Granted                  Emotional Assessment              Admission diagnosis:  chest pain Patient Active Problem List   Diagnosis Date Noted  . Chest pain 07/06/2019  . Acute on chronic respiratory failure with hypoxemia ([place]) 06/19/2018  . Acute respiratory failure ([place]) 06/19/2018  . Acute on chronic congestive heart failure ([place])   . ESRD on hemodialysis ([place])   . Palliative care by specialist   . Acute respiratory failure with hypoxemia ([place]) 04/18/2018  .  Anemia of chronic disease 03/24/2018  . NSTEMI (non-ST elevated myocardial infarction) ([place]) 03/23/2018  . Acute on chronic diastolic CHF (congestive heart failure) ([place]) 03/02/2018  . GERD (gastroesophageal reflux disease) 03/02/2018  . Respiratory failure ([place]) 01/11/2018  . Abdominal pain 06/28/2017  . Anxiety, generalized 06/27/2017  . COPD (chronic obstructive pulmonary disease) ([place]) 06/26/2017  . Peritonitis ([place]) 05/28/2017  . Bilateral carotid artery stenosis 03/08/2017  . ESRD on dialysis ([place]) 03/08/2017  . Non-ST elevation myocardial infarction (NSTEMI), subendocardial infarction, subsequent episode of care ([place]) 11/04/2016  . Onychogryphosis 10/31/2016  . Onychomycosis 10/31/2016  . Subungual exostosis 10/31/2016  . Type 2 diabetes mellitus with diabetic neuropathy ([place]) 10/31/2016  . Chronic osteomyelitis of left hand ([place]) 09/03/2016  . Left arm swelling 08/30/2016  . Pre-operative clearance 08/30/2016  . Coronary artery disease of native artery of native heart with stable angina pectoris ([place]) 08/03/2016  . Benign paroxysmal positional vertigo due to bilateral vestibular disorder 07/26/2016  . H/O: CVA (cerebrovascular accident) 07/26/2016  . Stable angina ([place]) 07/05/2016  . Sepsis due to pneumonia ([place]) 07/05/2016  . Fracture  of left foot 04/26/2016  . Pre-transplant evaluation for kidney transplant 02/04/2016  . Chest pain at rest 12/17/2015  . Ischemic chest pain ([place]) 12/17/2015  . DJD of shoulder 10/21/2015  . Perianal lesion 05/20/2015  . 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 Lumbar radiculopathy 05/14/2015  . 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 Benign essential hypertension 04/22/2015  . Grief 03/18/2015  . Lumbosacral facet joint syndrome 02/16/2015  . Greater trochanteric bursitis 02/16/2015  . DDD (degenerative disc disease), lumbosacral 01/20/2015  . 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  Osteoarthritis 01/20/2015  . IDA (iron deficiency anemia) 03/25/2014  . Thyroid nodule 01/15/2014  . Lung nodule 08/28/2013  . Tobacco abuse 05/17/2013  . Mixed hyperlipidemia 06/14/2011  . OSA on CPAP 06/14/2011  . PAD (peripheral artery disease) ([place]) 06/14/2011  . Depression 05/05/2011  . Chronic kidney disease (CKD), stage V ([place]) 03/26/2011  . Multiple vessel coronary artery disease 03/26/2011  . Obesity, unspecified 03/26/2011  . Type 2 diabetes mellitus ([place]) 03/26/2011   PCP:  Clinic, [facility]:   [facility] ([address]), [place] [address] [place]) [place] [address] Phone: [phone] Fax: [phone]     Social Determinants of Health (SDOH) Interventions    Readmission Risk Interventions No flowsheet data found.

## 2019-07-06 NOTE — ED Notes (Signed)
Spoke with daughter [name] [phone] and she requested the pt be sent to [facility]- pt gave verbal permission for her daughter to be called with updates

## 2019-07-06 NOTE — Progress Notes (Signed)
Pre HD Assessment:    07/06/19 2115  Neurological  Level of Consciousness Alert  Orientation Level Oriented X4  Respiratory  Respiratory Pattern Dyspnea with exertion  Bilateral Breath Sounds Diminished  Cardiac  Pulse Regular  Heart Sounds S1, S2  Vascular  R Radial Pulse +2  L Radial Pulse +2  Edema Generalized  Integumentary  Integumentary (WDL) X  Skin Condition Dry  Musculoskeletal  Musculoskeletal (WDL) X  Gastrointestinal  Bowel Sounds Assessment Active  GU Assessment  Genitourinary (WDL) X  Genitourinary Symptoms Oliguria  Psychosocial  Psychosocial (WDL) WDL  Incision (Closed) 07/06/19 Leg Right;Medial;Lower  Date First Assessed/Time First Assessed: 07/06/19 0804   Location: Leg  Location Orientation: Right;Medial;Lower  Present on Admission: Yes  Dressing Dry;Intact  Dressing Change Frequency  (OTA)  Closure Staples  Drainage Amount  (drainage to abd fold per pt, not assessed)

## 2019-07-06 NOTE — ED Notes (Signed)
Secure message sent to Dr Anselm Jungling about open staples on pt's wound

## 2019-07-06 NOTE — ED Notes (Signed)
Pt reports no more chest pain at this time, second IV line placed due to multiple drips ordered.

## 2019-07-06 NOTE — ED Notes (Addendum)
Pt complaining of pain at staple site under her abdomen- dressing changed and site cleansed- site appears red and slightly open- pt states she noticed an odor coming from the site yesterday

## 2019-07-06 NOTE — ED Triage Notes (Addendum)
Pt arrived via EMS from [facility]. Pt reports chest pain that started around 230am, pt reports pain went away after about 1 hour, pt states around 630am she was getting ready for dialysis and when she arrived at dialysis started having chest pain again.  Pt reports pain radiating down left arm along with nausea and some vomiting.  Pt was given 324 ASA with EMS which brought her pain from 9 to 0.  Pt is alert and oriented, states she had recent bypass surgery at [facility] in the right leg. Pt has staples present.   Pt states her last HD treatment was THursday but only did 2.5 hours because she had an appointment that day.

## 2019-07-06 NOTE — ED Notes (Signed)
XR in room 

## 2019-07-06 NOTE — ED Notes (Signed)
Pt up to use bathroom 

## 2019-07-06 NOTE — Progress Notes (Addendum)
Pre HD Tx:  Pt A&O x 4, denies pain or discomfort, O2 via [place] at 2L/minute. AVG bruit and thrill present, skin dry and ashen, Surgical wound to RLE approximated with staples, assessed to thigh. Moist under abdominal fold per pt.     07/06/19 2115  Vital Signs  Temp 98.7 F (37.1 C)  Temp Source Oral  Pulse Rate 72  Pulse Rate Source Dinamap  Resp 18  BP 121/64  BP Location Right Arm  BP Method Automatic  Patient Position (if appropriate) Lying  Oxygen Therapy  SpO2 96 %  O2 Device Nasal Cannula  O2 Flow Rate (L/min) 2 L/min  Pain Assessment  Pain Scale 0-10  Pain Score 0  Dialysis Weight  Weight 98.7 kg  Type of Weight Pre-Dialysis  Time-Out for Hemodialysis  What Procedure? HD   Pt Identifiers(min of two) First/Last Name;MRN/Account#;Pt's DOB(use if MRN/Acct# not available  Correct Site? Yes  Correct Side? Yes  Correct Procedure? Yes  Consents Verified? Yes  Rad Studies Available? N/A  Safety Precautions Reviewed? Yes  Engineer, civil (consulting) Number 5  Station Number 4  UF/Alarm Test Passed  Conductivity: Meter 13.8  Conductivity: Machine  14  pH 7.2  Reverse Osmosis main  Normal Saline Lot Number [id number]  Dialyzer Lot Number [id number]  Disposable Set Lot Number [id number]  Machine Temperature 98.6 F (37 C)  Musician and Audible Yes  Blood Lines Intact and Secured Yes  Pre Treatment Patient Checks  Vascular access used during treatment Graft  HD catheter dressing before treatment  (n/a)  Patient is receiving dialysis in a chair  (no)  Hepatitis B Surface Antigen Results Pending  Isolation Initiated  (no )  Hepatitis B Surface Antibody  (>10)  Date Hepatitis B Surface Antibody Drawn 01/11/18  Hemodialysis Consent Verified Yes  Hemodialysis Standing Orders Initiated Yes  ECG (Telemetry) Monitor On Yes  Prime Ordered Normal Saline  Length of  DialysisTreatment -hour(s) 3.5 Hour(s)  Dialysis Treatment Comments  (Na 140)  Dialyzer Elisio 17H NR   Dialysate 2K;2.5 Ca  Dialysate Flow Ordered 800  Blood Flow Rate Ordered 400 mL/min  Ultrafiltration Goal 1.5 Liters  Dialysis Blood Pressure Support Ordered Normal Saline  Education / Care Plan  Dialysis Education Provided Yes  Documented Education in Care Plan Yes  Outpatient Plan of Care Reviewed and on Chart Yes  Fistula / Graft Left Upper arm Arteriovenous fistula  No Placement Date or Time found.   Placed prior to admission: Yes  Orientation: Left  Access Location: Upper arm  Access Type: Arteriovenous fistula  Site Condition No complications  Fistula / Graft Assessment Bruit;Thrill;Present  Status Accessed

## 2019-07-07 ENCOUNTER — Observation Stay: Payer: Medicare Other

## 2019-07-07 ENCOUNTER — Encounter: Payer: Self-pay | Admitting: Radiology

## 2019-07-07 DIAGNOSIS — I25119 Atherosclerotic heart disease of native coronary artery with unspecified angina pectoris: Secondary | ICD-10-CM | POA: Diagnosis not present

## 2019-07-07 LAB — CBC
HCT: 26.9 % — ABNORMAL LOW (ref 36.0–46.0)
Hemoglobin: 8.4 g/dL — ABNORMAL LOW (ref 12.0–15.0)
MCH: 31.7 pg (ref 26.0–34.0)
MCHC: 31.2 g/dL (ref 30.0–36.0)
MCV: 101.5 fL — ABNORMAL HIGH (ref 80.0–100.0)
Platelets: 193 10*3/uL (ref 150–400)
RBC: 2.65 MIL/uL — ABNORMAL LOW (ref 3.87–5.11)
RDW: 14.8 % (ref 11.5–15.5)
WBC: 6.7 10*3/uL (ref 4.0–10.5)
nRBC: 0 % (ref 0.0–0.2)

## 2019-07-07 LAB — BASIC METABOLIC PANEL
Anion gap: 12 (ref 5–15)
BUN: 21 mg/dL (ref 8–23)
CO2: 28 mmol/L (ref 22–32)
Calcium: 8.3 mg/dL — ABNORMAL LOW (ref 8.9–10.3)
Chloride: 97 mmol/L — ABNORMAL LOW (ref 98–111)
Creatinine, Ser: 4.99 mg/dL — ABNORMAL HIGH (ref 0.44–1.00)
GFR calc Af Amer: 10 mL/min — ABNORMAL LOW (ref >60)
GFR calc non Af Amer: 8 mL/min — ABNORMAL LOW (ref >60)
Glucose, Bld: 148 mg/dL — ABNORMAL HIGH (ref 70–99)
Potassium: 3.2 mmol/L — ABNORMAL LOW (ref 3.5–5.1)
Sodium: 137 mmol/L (ref 135–145)

## 2019-07-07 LAB — HEPARIN LEVEL (UNFRACTIONATED)
Heparin Unfractionated: 0.27 IU/mL — ABNORMAL LOW (ref 0.30–0.70)
Heparin Unfractionated: 0.3 IU/mL (ref 0.30–0.70)
Heparin Unfractionated: 0.19 IU/mL — ABNORMAL LOW (ref 0.30–0.70)

## 2019-07-07 LAB — TROPONIN I (HIGH SENSITIVITY)
Troponin I (High Sensitivity): 16 ng/L (ref <18)
Troponin I (High Sensitivity): 20 ng/L — ABNORMAL HIGH (ref <18)

## 2019-07-07 MED ORDER — SODIUM CHLORIDE 0.9 % IV SOLN: INTRAVENOUS | Status: DC

## 2019-07-07 MED ORDER — NITROGLYCERIN 0.4 MG SL SUBL
SUBLINGUAL_TABLET | SUBLINGUAL | Status: AC
  Filled 2019-07-07: qty 3

## 2019-07-07 MED ORDER — HEPARIN BOLUS VIA INFUSION
2500.0000 [IU] | Freq: Once | INTRAVENOUS | Status: AC
  Administered 2019-07-07: 2500 [IU] via INTRAVENOUS
  Filled 2019-07-07: qty 2500

## 2019-07-07 MED ORDER — NITROGLYCERIN 0.4 MG SL SUBL
0.4000 mg | SUBLINGUAL_TABLET | SUBLINGUAL | Status: AC | PRN
  Administered 2019-07-07 (×3): 0.4 mg via SUBLINGUAL
  Filled 2019-07-07: qty 1

## 2019-07-07 MED ORDER — ASPIRIN 81 MG PO CHEW
324.0000 mg | CHEWABLE_TABLET | ORAL | Status: AC
  Administered 2019-07-07: 05:00:00 324 mg via ORAL

## 2019-07-07 MED ORDER — NEPRO/CARBSTEADY PO LIQD
237.0000 mL | Freq: Two times a day (BID) | ORAL | Status: DC
  Administered 2019-07-07 – 2019-07-08 (×3): 237 mL via ORAL

## 2019-07-07 MED ORDER — MORPHINE SULFATE (PF) 2 MG/ML IV SOLN
1.0000 mg | INTRAVENOUS | Status: DC | PRN
  Administered 2019-07-07: 1 mg via INTRAVENOUS
  Filled 2019-07-07: qty 1

## 2019-07-07 MED ORDER — IOHEXOL 350 MG/ML SOLN
75.0000 mL | Freq: Once | INTRAVENOUS | Status: AC | PRN
  Administered 2019-07-07: 75 mL via INTRAVENOUS

## 2019-07-07 MED ORDER — ASPIRIN 81 MG PO CHEW: 325.0000 mg | CHEWABLE_TABLET | Freq: Once | ORAL | Status: DC

## 2019-07-07 MED ORDER — ASPIRIN 81 MG PO CHEW
CHEWABLE_TABLET | ORAL | Status: AC
  Administered 2019-07-07: 324 mg via ORAL
  Filled 2019-07-07: qty 4

## 2019-07-07 MED ORDER — ASPIRIN 81 MG PO CHEW
CHEWABLE_TABLET | ORAL | Status: AC
  Filled 2019-07-07: qty 4

## 2019-07-07 MED ORDER — LABETALOL HCL 5 MG/ML IV SOLN: 5.0000 mg | INTRAVENOUS | Status: DC | PRN

## 2019-07-07 MED ORDER — NICOTINE 21 MG/24HR TD PT24
21.0000 mg | MEDICATED_PATCH | Freq: Every day | TRANSDERMAL | Status: DC
  Administered 2019-07-07 – 2019-07-08 (×2): 21 mg via TRANSDERMAL
  Filled 2019-07-07 (×2): qty 1

## 2019-07-07 NOTE — Progress Notes (Signed)
Post HD Assessment:    07/07/19 0106  Neurological  Level of Consciousness Alert  Orientation Level Oriented X4  Respiratory  Bilateral Breath Sounds Diminished  Cardiac  Pulse Regular  Heart Sounds S1, S2  ECG Monitor Yes  Cardiac Rhythm VF;SVT;NSR  Vascular  R Radial Pulse +2  L Radial Pulse +2  Edema Generalized  Integumentary  Integumentary (WDL) X  Skin Condition Dry  Musculoskeletal  Musculoskeletal (WDL) X  Gastrointestinal  Bowel Sounds Assessment Active  GU Assessment  Genitourinary (WDL) X  Genitourinary Symptoms Oliguria  Psychosocial  Psychosocial (WDL) WDL  Incision (Closed) 07/06/19 Leg Right;Medial;Lower  Date First Assessed/Time First Assessed: 07/06/19 0804   Location: Leg  Location Orientation: Right;Medial;Lower  Present on Admission: Yes  Dressing Dry;Intact  Dressing Change Frequency  (OTA)  Closure Staples  Drainage Amount  (drainage to abd fold per pt, not assessed)

## 2019-07-07 NOTE — Progress Notes (Signed)
Post HD:    07/07/19 0105  Vital Signs  Temp 98.7 F (37.1 C)  Temp Source Oral  Pulse Rate 77  Pulse Rate Source Dinamap  Resp 18  BP (!) 124/58  BP Location Right Arm  BP Method Automatic  Patient Position (if appropriate) Lying  Oxygen Therapy  SpO2 100 %  O2 Device Nasal Cannula  O2 Flow Rate (L/min) 2 L/min  Pain Assessment  Pain Scale 0-10  Pain Score 0  Post-Hemodialysis Assessment  Rinseback Volume (mL) 250 mL  KECN 70.7 V  Dialyzer Clearance Lightly streaked  Duration of HD Treatment -hour(s) 3.5 hour(s)  Hemodialysis Intake (mL) 500 mL  UF Total -Machine (mL) 1781 mL  Net UF (mL) 1281 mL  Tolerated HD Treatment  (tolerated majority of tx)  AVG/AVF Arterial Site Held (minutes)  (15)  AVG/AVF Venous Site Held (minutes)  (15)  Fistula / Graft Left Upper arm Arteriovenous fistula  No Placement Date or Time found.   Placed prior to admission: Yes  Orientation: Left  Access Location: Upper arm  Access Type: Arteriovenous fistula  Site Condition No complications  Fistula / Graft Assessment Bruit;Thrill;Present  Status Deaccessed

## 2019-07-07 NOTE — Consult Note (Signed)
[facility] Nurse wound consult note Reason for Consult: Incision line closely approximated with staples from procedure performed by vascular surgery at [facility] on 06/17/19 (Right profunda-pop bypass) except for area near to inguinal area with mild separation and yellow slough at wound bed. No significant indications of infection: no warmth, induration, erythema or edema or new onset pain.  See photo taken on admission by Dr. Anselm Jungling in the note from 07/06/19 at 9:20am.  Today patient has been seen by both Vascular MD Dr. Lorenso Courier and Podiatry MD Dr.Cline.  Please refer to their notes. Dr. Lorenso Courier recommended [facility] Nurse for topical care suggestion. Wound type: Surgical Wound is as described above and documented via photography.  Recommend twice daily application of a NS damp dressing twice daily to the area of mild incisional separation topped with a dry dressing and secured with tape.   Patient to follow up with the surgery office performing the procedure at the next scheduled appointment time for staple removal, instructions for wound aftercare and wound assessment. If no scheduled appointment, patient to contact that office for appointment.  No further indications for [facility] nursing services. Please notify attending MD for signs or symptoms of wound infection including but not limited to increase in drainage, increase in pain, new onset induration, erythema, induration or warmth at incision site.  [facility] nursing team will not follow, but will remain available to this patient, the nursing and medical teams.  Please re-consult if needed. Thanks, Maudie Flakes, MSN, RN, [place], Arther Abbott  Pager# [phone]

## 2019-07-07 NOTE — Consult Note (Signed)
Reason for Consult:RIGHT Leg incisional wound dehiscence Referring Physician: Dr. Larita Shaffer is an 67 y.o. female.  HPI: [name] is a 68 y.o. 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 Noted upon exam to have a partial wound dehiscence. States she noted the wound opened up and the staples separated a week or more ago, denies drainage, redness or tenderness. Denies leg or foot pain however, complains of right 5th toe pain.      Past Medical History:  Diagnosis Date  . Asthma   . CAD (coronary artery disease)   . Chronic lower back pain   . COPD (chronic obstructive pulmonary disease) ([place])   . Depression   . Diabetes mellitus without complication (HCC)    NIDD  . H/O angina pectoris   . H/O blood clots   . Hypercholesteremia   . Hypertension   . Left rotator cuff tear   . Myocardial infarction ([place])   . Obesity   . Renal insufficiency   . Stroke ([place])   . Vertigo, aural     Past Surgical History:  Procedure Laterality Date  . A/V FISTULAGRAM Left 03/14/2017   Procedure: A/V Fistulagram;  Surgeon: Katha Cabal, MD;  Location: [place] [facility];  Service: Cardiovascular;  Laterality: Left;  . AV FISTULA PLACEMENT Left 11-04-2015  . AV FISTULA PLACEMENT Left 11/04/2015   Procedure: ARTERIOVENOUS (AV) FISTULA CREATION  ( BRACHIAL CEPHALIC );  Surgeon: Katha Cabal, MD;  Location: [facility];  Service: Vascular;  Laterality: Left;  . CARDIAC CATHETERIZATION    . CARDIAC CATHETERIZATION Left 07/19/2016   Procedure: Left Heart Cath and Coronary Angiography;  Surgeon: Corey Skains, MD;  Location: [place] [facility];  Service: Cardiovascular;  Laterality: Left;  . CORONARY ARTERY BYPASS GRAFT  2012  . CORONARY STENT PLACEMENT    . JOINT REPLACEMENT    . KNEE SURGERY  Bilateral   . LEFT HEART CATH AND CORONARY ANGIOGRAPHY N/A 12/25/2017   Procedure: LEFT HEART CATH AND CORONARY ANGIOGRAPHY;  Surgeon: Teodoro Spray, MD;  Location: [place] [facility];  Service: Cardiovascular;  Laterality: N/A;  . LEFT HEART CATH AND CORONARY ANGIOGRAPHY Right 03/23/2018   Procedure: LEFT HEART CATH AND CORONARY ANGIOGRAPHY;  Surgeon: Dionisio David, MD;  Location: [place] [facility];  Service: Cardiovascular;  Laterality: Right;  . LEFT HEART CATH AND CORONARY ANGIOGRAPHY N/A 05/30/2018   Procedure: LEFT HEART CATH AND CORONARY ANGIOGRAPHY;  Surgeon: Corey Skains, MD;  Location: [place] [facility];  Service: Cardiovascular;  Laterality: N/A;  . PERIPHERAL VASCULAR CATHETERIZATION N/A 11/13/2015   Procedure: Dialysis/Perma Catheter Insertion;  Surgeon: Katha Cabal, MD;  Location: [place] [facility];  Service: Cardiovascular;  Laterality: N/A;  . PERIPHERAL VASCULAR CATHETERIZATION N/A 12/04/2015   Procedure: Dialysis/Perma Catheter Insertion;  Surgeon: Katha Cabal, MD;  Location: [place] [facility];  Service: Cardiovascular;  Laterality: N/A;  . PERIPHERAL VASCULAR CATHETERIZATION Left 12/31/2015   Procedure: A/V Shuntogram/Fistulagram;  Surgeon: Algernon Huxley, MD;  Location: [place] [facility];  Service: Cardiovascular;  Laterality: Left;  . PERIPHERAL VASCULAR CATHETERIZATION N/A 12/31/2015   Procedure: A/V Shunt Intervention;  Surgeon: Algernon Huxley, MD;  Location: [place] [facility];  Service: Cardiovascular;  Laterality: N/A;  . PERIPHERAL VASCULAR CATHETERIZATION  Left 02/25/2016   Procedure: A/V Shuntogram/Fistulagram;  Surgeon: Algernon Huxley, MD;  Location: [place] [facility];  Service: Cardiovascular;  Laterality: Left;  . PERIPHERAL VASCULAR CATHETERIZATION N/A 03/18/2016   Procedure: Dialysis/Perma Catheter Removal;  Surgeon: Katha Cabal, MD;  Location: [place] [facility];  Service: Cardiovascular;  Laterality: N/A;  . REMOVAL OF A DIALYSIS  CATHETER N/A 06/30/2017   Procedure: REMOVAL OF A DIALYSIS CATHETER;  Surgeon: Katha Cabal, MD;  Location: [facility];  Service: Vascular;  Laterality: N/A;  . REPLACEMENT TOTAL KNEE BILATERAL Bilateral     Family History  Problem Relation Age of Onset  . Cancer Mother   . Cancer Father   . Diabetes Brother   . Heart disease Brother     Social History:  reports that she has been smoking cigarettes. She has been smoking about 0.25 packs per day. She has never used smokeless tobacco. She reports that she does not drink alcohol or use drugs.  Allergies:  Allergies  Allergen Reactions  . Nystatin Hives and Itching  . Sulfa Antibiotics Swelling, Hives and Rash  . Niaspan [Niacin] Other (See Comments) and Hives    Medications: I have reviewed the patient's current medications.  Results for orders placed or performed during the hospital encounter of 07/06/19 (from the past 48 hour(s))  Comprehensive metabolic panel     Status: Abnormal   Collection Time: 07/06/19  7:56 AM  Result Value Ref Range   Sodium 137 135 - 145 mmol/L   Potassium 4.3 3.5 - 5.1 mmol/L   Chloride 93 (L) 98 - 111 mmol/L   CO2 27 22 - 32 mmol/L   Glucose, Bld 123 (H) 70 - 99 mg/dL   BUN 53 (H) 8 - 23 mg/dL   Creatinine, Ser 8.95 (H) 0.44 - 1.00 mg/dL   Calcium 7.9 (L) 8.9 - 10.3 mg/dL   Total Protein 7.6 6.5 - 8.1 g/dL   Albumin 3.3 (L) 3.5 - 5.0 g/dL   AST 13 (L) 15 - 41 U/L   ALT 7 0 - 44 U/L   Alkaline Phosphatase 102 38 - 126 U/L   Total Bilirubin 0.7 0.3 - 1.2 mg/dL   GFR calc non Af Amer 4 (L) >60 mL/min   GFR calc Af Amer 5 (L) >60 mL/min   Anion gap 17 (H) 5 - 15    Comment: Performed at [facility], [address] [place], [place] [address]  Troponin I (High Sensitivity)     Status: None   Collection Time: 07/06/19  7:56 AM  Result Value Ref Range   Troponin I (High Sensitivity) 16 <18 ng/L    Comment: (NOTE) Elevated high sensitivity troponin I (hsTnI) values and significant   changes across serial measurements may suggest ACS but many other  chronic and acute conditions are known to elevate hsTnI results.  Refer to the "Links" section for chest pain algorithms and additional  guidance. Performed at [facility], [address] [place], [place] [address]   CBC with Differential     Status: Abnormal   Collection Time: 07/06/19  7:56 AM  Result Value Ref Range   WBC 6.5 4.0 - 10.5 K/uL   RBC 2.72 (L) 3.87 - 5.11 MIL/uL   Hemoglobin 8.7 (L) 12.0 - 15.0 g/dL   HCT 27.8 (L) 36.0 - 46.0 %   MCV 102.2 (H) 80.0 - 100.0 fL   MCH 32.0 26.0 - 34.0 pg   MCHC 31.3 30.0 - 36.0  g/dL   RDW 15.2 11.5 - 15.5 %   Platelets 209 150 - 400 K/uL   nRBC 0.0 0.0 - 0.2 %   Neutrophils Relative % 60 %   Neutro Abs 4.0 1.7 - 7.7 K/uL   Lymphocytes Relative 21 %   Lymphs Abs 1.3 0.7 - 4.0 K/uL   Monocytes Relative 9 %   Monocytes Absolute 0.6 0.1 - 1.0 K/uL   Eosinophils Relative 8 %   Eosinophils Absolute 0.5 0.0 - 0.5 K/uL   Basophils Relative 1 %   Basophils Absolute 0.1 0.0 - 0.1 K/uL   Immature Granulocytes 1 %   Abs Immature Granulocytes 0.03 0.00 - 0.07 K/uL    Comment: Performed at [facility], [address] [place], [place] [address]  Protime-INR     Status: None   Collection Time: 07/06/19  7:56 AM  Result Value Ref Range   Prothrombin Time 13.8 11.4 - 15.2 seconds   INR 1.1 0.8 - 1.2    Comment: (NOTE) INR goal varies based on device and disease states. Performed at [facility], [place], [place] [address]   APTT     Status: Abnormal   Collection Time: 07/06/19  7:56 AM  Result Value Ref Range   aPTT 39 (H) 24 - 36 seconds    Comment:        IF BASELINE aPTT IS ELEVATED, SUGGEST PATIENT RISK ASSESSMENT BE USED TO DETERMINE APPROPRIATE ANTICOAGULANT THERAPY. Performed at [facility], [address] [place], [place] [address]   Brain natriuretic peptide     Status: Abnormal   Collection  Time: 07/06/19  7:56 AM  Result Value Ref Range   B Natriuretic Peptide 271.0 (H) 0.0 - 100.0 pg/mL    Comment: Performed at [facility], [address] [place], [place] [address]  Lipid panel     Status: None   Collection Time: 07/06/19  7:56 AM  Result Value Ref Range   Cholesterol 137 0 - 200 mg/dL   Triglycerides 115 <150 mg/dL   HDL 41 >40 mg/dL   Total CHOL/HDL Ratio 3.3 RATIO   VLDL 23 0 - 40 mg/dL   LDL Cholesterol 73 0 - 99 mg/dL    Comment:        Total Cholesterol/HDL:CHD Risk Coronary Heart Disease Risk Table                     Men   Women  1/2 Average Risk   3.4   3.3  Average Risk       5.0   4.4  2 X Average Risk   9.6   7.1  3 X Average Risk  23.4   11.0        Use the calculated Patient Ratio above and the CHD Risk Table to determine the patient's CHD Risk.        ATP III CLASSIFICATION (LDL):  <100     mg/dL   Optimal  100-129  mg/dL   Near or Above                    Optimal  130-159  mg/dL   Borderline  160-189  mg/dL   High  >190     mg/dL   Very High Performed at [place], [place] [address]   Troponin I (High Sensitivity)     Status: None   Collection Time: 07/06/19 10:39  AM  Result Value Ref Range   Troponin I (High Sensitivity) 17 <18 ng/L    Comment: (NOTE) Elevated high sensitivity troponin I (hsTnI) values and significant  changes across serial measurements may suggest ACS but many other  chronic and acute conditions are known to elevate hsTnI results.  Refer to the "Links" section for chest pain algorithms and additional  guidance. Performed at [facility], [place], [place] [address]   SARS CORONAVIRUS 2 (TAT 6-24 HRS) Nasopharyngeal Nasopharyngeal Swab     Status: None   Collection Time: 07/06/19 10:46 AM   Specimen: Nasopharyngeal Swab  Result Value Ref Range   SARS Coronavirus 2 NEGATIVE NEGATIVE    Comment: (NOTE) SARS-CoV-2 target nucleic acids are NOT  DETECTED. The SARS-CoV-2 RNA is generally detectable in upper and lower respiratory specimens during the acute phase of infection. Negative results do not preclude SARS-CoV-2 infection, do not rule out co-infections with other pathogens, and should not be used as the sole basis for treatment or other patient management decisions. Negative results must be combined with clinical observations, patient history, and epidemiological information. The expected result is Negative. Fact Sheet for Patients: SugarRoll.be Fact Sheet for Healthcare Providers: https://www.woods-mathews.com/ This test is not yet approved or cleared by the [place] FDA and  has been authorized for detection and/or diagnosis of SARS-CoV-2 by FDA under an Emergency Use Authorization (EUA). This EUA will remain  in effect (meaning this test can be used) for the duration of the COVID-19 declaration under Section 56 4(b)(1) of the Act, 21 U.S.C. section 360bbb-3(b)(1), unless the authorization is terminated or revoked sooner. Performed at [facility], [place] [address] [place], [place] [address]   Phosphorus     Status: None   Collection Time: 07/06/19  2:55 PM  Result Value Ref Range   Phosphorus 4.4 2.5 - 4.6 mg/dL    Comment: Performed at [facility], [address] [place], [place] [address]  HIV Antibody (routine testing w rflx)     Status: None   Collection Time: 07/06/19  2:55 PM  Result Value Ref Range   HIV Screen 4th Generation wRfx NON REACTIVE NON REACTIVE    Comment: Performed at [facility], [place] [address] [place], [place] [address]  MRSA PCR Screening     Status: None   Collection Time: 07/06/19  5:31 PM   Specimen: Nasopharyngeal  Result Value Ref Range   MRSA by PCR NEGATIVE NEGATIVE    Comment:        The GeneXpert MRSA Assay (FDA approved for NASAL specimens only), is one component of a comprehensive MRSA  colonization surveillance program. It is not intended to diagnose MRSA infection nor to guide or monitor treatment for MRSA infections. Performed at [facility], [place], [place] [address]   Heparin level (unfractionated)     Status: None   Collection Time: 07/06/19  6:27 PM  Result Value Ref Range   Heparin Unfractionated 0.65 0.30 - 0.70 IU/mL    Comment: (NOTE) If heparin results are below expected values, and patient dosage has  been confirmed, suggest follow up testing of antithrombin III levels. Performed at [facility], [place], [place] [address]   Heparin level (unfractionated)     Status: Abnormal   Collection Time: 07/07/19  1:47 AM  Result Value Ref Range   Heparin Unfractionated 0.27 (L) 0.30 - 0.70 IU/mL    Comment: (NOTE) If  heparin results are below expected values, and patient dosage has  been confirmed, suggest follow up testing of antithrombin III levels. Performed at [facility], [address] [place], [place] [id number]   Basic metabolic panel     Status: Abnormal   Collection Time: 07/07/19  4:40 AM  Result Value Ref Range   Sodium 137 135 - 145 mmol/L   Potassium 3.2 (L) 3.5 - 5.1 mmol/L   Chloride 97 (L) 98 - 111 mmol/L   CO2 28 22 - 32 mmol/L   Glucose, Bld 148 (H) 70 - 99 mg/dL   BUN 21 8 - 23 mg/dL   Creatinine, Ser 4.99 (H) 0.44 - 1.00 mg/dL   Calcium 8.3 (L) 8.9 - 10.3 mg/dL   GFR calc non Af Amer 8 (L) >60 mL/min   GFR calc Af Amer 10 (L) >60 mL/min   Anion gap 12 5 - 15    Comment: Performed at [facility], [address] [place], [place] [address]  CBC     Status: Abnormal   Collection Time: 07/07/19  4:40 AM  Result Value Ref Range   WBC 6.7 4.0 - 10.5 K/uL   RBC 2.65 (L) 3.87 - 5.11 MIL/uL   Hemoglobin 8.4 (L) 12.0 - 15.0 g/dL   HCT 26.9 (L) 36.0 - 46.0 %   MCV 101.5 (H) 80.0 - 100.0 fL   MCH 31.7 26.0 - 34.0 pg   MCHC 31.2 30.0 - 36.0 g/dL   RDW 14.8  11.5 - 15.5 %   Platelets 193 150 - 400 K/uL   nRBC 0.0 0.0 - 0.2 %    Comment: Performed at [facility], [place], [place] [address]  Troponin I (High Sensitivity)     Status: None   Collection Time: 07/07/19  4:40 AM  Result Value Ref Range   Troponin I (High Sensitivity) 16 <18 ng/L    Comment: (NOTE) Elevated high sensitivity troponin I (hsTnI) values and significant  changes across serial measurements may suggest ACS but many other  chronic and acute conditions are known to elevate hsTnI results.  Refer to the "Links" section for chest pain algorithms and additional  guidance. Performed at [facility], [place], [place] [address]   Troponin I (High Sensitivity)     Status: Abnormal   Collection Time: 07/07/19  6:20 AM  Result Value Ref Range   Troponin I (High Sensitivity) 20 (H) <18 ng/L    Comment: (NOTE) Elevated high sensitivity troponin I (hsTnI) values and significant  changes across serial measurements may suggest ACS but many other  chronic and acute conditions are known to elevate hsTnI results.  Refer to the "Links" section for chest pain algorithms and additional  guidance. Performed at [facility], [place], [place] [address]   Heparin level (unfractionated)     Status: None   Collection Time: 07/07/19  9:09 AM  Result Value Ref Range   Heparin Unfractionated 0.30 0.30 - 0.70 IU/mL    Comment: (NOTE) If heparin results are below expected values, and patient dosage has  been confirmed, suggest follow up testing of antithrombin III levels. Performed at [facility], [address] [place],  [address]     Dg Chest Portable 1 View  Result Date: 07/06/2019 CLINICAL DATA:  Chest pain EXAM: PORTABLE CHEST 1 VIEW COMPARISON:  11/27/2018 FINDINGS: Mild bibasilar scarring/atelectasis. No focal consolidation. No pleural effusion or pneumothorax. The heart is  top-normal in  size.  Thoracic aortic atherosclerosis. IMPRESSION: No evidence of acute cardiopulmonary disease. Electronically Signed   By: Julian Hy M.D.   On: 07/06/2019 08:26    Review of Systems  Constitutional: Negative for chills and fever.  HENT: Negative.   Eyes: Negative.   Respiratory: Negative for cough.   Cardiovascular: Positive for chest pain. Negative for claudication and leg swelling.  Genitourinary: Negative.   Musculoskeletal:       Right 5th toe pain  Skin: Negative.    Blood pressure 139/67, pulse 75, temperature 97.9 F (36.6 C), resp. rate 18, height 5\' 6"  (1.676 m), weight 98.6 kg, SpO2 100 %. Physical Exam  Nursing note and vitals reviewed. Constitutional: She is oriented to person, place, and time. She appears well-developed and well-nourished.  Neck: Neck supple.  Cardiovascular: Normal rate, regular rhythm and intact distal pulses.  Palpable right DP/PT  Respiratory: Effort normal and breath sounds normal. No respiratory distress. She has no wheezes.  GI: Soft. She exhibits no distension. There is no abdominal tenderness.  Musculoskeletal:        General: No edema.     Comments: RIGHT Lower extremity: warm, Incision from groin to midcalf-intact except proximal thigh- 4 cm region of separated staples and wound dehiscence, fat necrosis. No drainage, no erythema  Neurological: She is alert and oriented to person, place, and time.  Skin: Skin is warm. No erythema.    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  Staples from area removed, wound cleaned with betadine and saline, packed with saline wet-dry dressing, remainder of staple line painted with betadine.  Recommend wound care evaluation. Podiatry consult to evaluate right 5th toe. If still in house over the next few days would d/c remainder of staples. OK for OOB.  Esco, Miechia A 07/07/2019, 9:42 AM

## 2019-07-07 NOTE — Progress Notes (Signed)
[place] [facility] [place] NAME: Kimberly Shaffer    MR#:  [id number]  DATE OF BIRTH:  06-10-1951  SUBJECTIVE:  CHIEF COMPLAINT:   Chief Complaint  Patient presents with  . Chest Pain   Came with intermittent complaint of chest pain.  Troponins remains negative.  Last night she again had significant chest pain and her EKG was questionable.  Consulted cardiologist on-call for STEMI-he suggested this is not STEMI and continue to monitor.  Patient is already on heparin drip.  No more chest pain now.  REVIEW OF SYSTEMS:  CONSTITUTIONAL: No fever, fatigue or weakness.  EYES: No blurred or double vision.  EARS, NOSE, AND THROAT: No tinnitus or ear pain.  RESPIRATORY: No cough, shortness of breath, wheezing or hemoptysis.  CARDIOVASCULAR: No chest pain, orthopnea, edema.  GASTROINTESTINAL: No nausea, vomiting, diarrhea or abdominal pain.  GENITOURINARY: No dysuria, hematuria.  ENDOCRINE: No polyuria, nocturia,  HEMATOLOGY: No anemia, easy bruising or bleeding SKIN: No rash or lesion. MUSCULOSKELETAL: No joint pain or arthritis.   NEUROLOGIC: No tingling, numbness, weakness.  PSYCHIATRY: No anxiety or depression.   ROS  DRUG ALLERGIES:   Allergies  Allergen Reactions  . Nystatin Hives and Itching  . Sulfa Antibiotics Swelling, Hives and Rash  . Niaspan [Niacin] Other (See Comments) and Hives    VITALS:  Blood pressure 139/67, pulse 75, temperature 97.9 F (36.6 C), resp. rate 18, height 5\' 6"  (1.676 m), weight 98.6 kg, SpO2 100 %.  PHYSICAL EXAMINATION:   GENERAL:  68 y.o.-year-old patient lying in the bed with no acute distress.  EYES: Pupils equal, round, reactive to light and accommodation. No scleral icterus. Extraocular muscles intact.  HEENT: Head atraumatic, normocephalic. Oropharynx and nasopharynx clear.  NECK:  Supple, no jugular venous distention. No thyroid enlargement, no tenderness.  LUNGS: Normal breath sounds bilaterally, no  wheezing, rales,rhonchi or crepitation. No use of accessory muscles of respiration.  CARDIOVASCULAR: S1, S2 normal. No murmurs, rubs, or gallops.  ABDOMEN: Soft, nontender, nondistended. Bowel sounds present. No organomegaly or mass.  EXTREMITIES: No pedal edema, cyanosis, or clubbing.  There is a recent surgery on right lower extremity in the wound extending from mid of calf until her inguinal region on the right, upper end of the wound in her thigh has some open staples with yellowish looking pus in the opening.  She is not tender, red, warm around the wound. NEUROLOGIC: Cranial nerves II through XII are intact. Muscle strength 5/5 in all extremities. Sensation intact. Gait not checked.  PSYCHIATRIC: The patient is alert and oriented x 3.  SKIN: No obvious rash, lesion, or ulcer.    Physical Exam LABORATORY PANEL:   CBC Recent Labs  Lab 07/07/19 0440  WBC 6.7  HGB 8.4*  HCT 26.9*  PLT 193   ------------------------------------------------------------------------------------------------------------------  Chemistries  Recent Labs  Lab 07/06/19 0756 07/07/19 0440  NA 137 137  K 4.3 3.2*  CL 93* 97*  CO2 27 28  GLUCOSE 123* 148*  BUN 53* 21  CREATININE 8.95* 4.99*  CALCIUM 7.9* 8.3*  AST 13*  --   ALT 7  --   ALKPHOS 102  --   BILITOT 0.7  --    ------------------------------------------------------------------------------------------------------------------  Cardiac Enzymes No results for input(s): TROPONINI in the last 168 hours. ------------------------------------------------------------------------------------------------------------------  RADIOLOGY:  Ct Angio Chest Pe W Or Wo Contrast  Result Date: 07/07/2019 CLINICAL DATA:  68 year old female with high probability of pulmonary embolism. Chest pain with increasing shortness  of breath on exertion. EXAM: CT ANGIOGRAPHY CHEST WITH CONTRAST TECHNIQUE: Multidetector CT imaging of the chest was performed using the  standard protocol during bolus administration of intravenous contrast. Multiplanar CT image reconstructions and MIPs were obtained to evaluate the vascular anatomy. CONTRAST:  78mL OMNIPAQUE IOHEXOL 350 MG/ML SOLN COMPARISON:  Chest CTA 12/17/2015. FINDINGS: Cardiovascular: No filling defects within the pulmonary arterial tree to suggest underlying pulmonary embolism. Heart size is enlarged with left atrial and left ventricular dilatation. There is no significant pericardial fluid, thickening or pericardial calcification. There is aortic atherosclerosis, as well as atherosclerosis of the great vessels of the mediastinum and the coronary arteries, including calcified atherosclerotic plaque in the left main, left anterior descending, left circumflex and right coronary arteries. Thickening calcification of the aortic valve. Dilatation of the pulmonic trunk (3.6 cm in diameter). Mediastinum/Nodes: No pathologically enlarged mediastinal or hilar lymph nodes. Esophagus is unremarkable in appearance. No axillary lymphadenopathy. Lungs/Pleura: Small pulmonary nodules in the right upper lobe measuring up to 5 mm (axial image 40 of series 6). No other larger more suspicious appearing pulmonary nodules or masses are noted. No acute consolidative airspace disease. No pleural effusions. Upper Abdomen: 1.3 cm exophytic low-attenuation lesion in the anterior aspect of the upper pole the right kidney, compatible with a simple cyst. Aortic atherosclerosis. Musculoskeletal: Status post resection of the anterior aspect of the left fifth rib. There are no aggressive appearing lytic or blastic lesions noted in the visualized portions of the skeleton. Review of the MIP images confirms the above findings. IMPRESSION: 1. No evidence of pulmonary embolism. 2. No acute findings are noted in the thorax to account for the patient's symptoms. 3. Aortic atherosclerosis, in addition to left main and 3 vessel coronary artery disease. Please note  that although the presence of coronary artery calcium documents the presence of coronary artery disease, the severity of this disease and any potential stenosis cannot be assessed on this non-gated CT examination. Assessment for potential risk factor modification, dietary therapy or pharmacologic therapy may be warranted, if clinically indicated. 4. Dilatation of the pulmonic trunk (3.6 cm in diameter), concerning for pulmonary arterial hypertension. 5. Cardiomegaly with left atrial and left ventricular dilatation. Electronically Signed   By: Vinnie Langton M.D.   On: 07/07/2019 12:36   Dg Chest Portable 1 View  Result Date: 07/06/2019 CLINICAL DATA:  Chest pain EXAM: PORTABLE CHEST 1 VIEW COMPARISON:  11/27/2018 FINDINGS: Mild bibasilar scarring/atelectasis. No focal consolidation. No pleural effusion or pneumothorax. The heart is top-normal in size.  Thoracic aortic atherosclerosis. IMPRESSION: No evidence of acute cardiopulmonary disease. Electronically Signed   By: Julian Hy M.D.   On: 07/06/2019 08:26    ASSESSMENT AND PLAN:   Principal Problem:   Ischemic chest pain [facility]) Active Problems:   Chest pain  *Chest pain Troponin is negative, started on heparin IV drip because of typical pain. Cardiology consult is called in. Monitor on telemetry. Further management as per cardiologist. -As patient again had chest pain last night cardiologist was called in for concerns of STEMI but that was denied to be STEMI by cardiologist. -Patient is not on any anticoagulation so I will get CT scan chest angiogram to rule out pulmonary embolism.  *History of coronary artery disease Continue cardiac meds at home dose.  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 She does  not look to have acute infection there. She had a consult  by vascular team.  Suggested to pack with wet and dry dressing after cleaning.  Also suggested to get podiatry consult because of gangrenous fifth toe.  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 No further work-up as per podiatry.   *End-stage renal disease on hemodialysis Nephrologist is consulted to continue dialysis here.  *Hypertension Continue home medications.  *COPD Currently no acute exacerbation.  Continue home inhalers.   All the records are reviewed and case discussed with Care Management/Social Workerr. Management plans discussed with the patient, family and they are in agreement.  CODE STATUS: full.  TOTAL TIME TAKING CARE OF THIS PATIENT: 35 minutes.     POSSIBLE D/C IN 1-2 DAYS, DEPENDING ON CLINICAL CONDITION.   Vaughan Basta M.D on 07/07/2019   Between 7am to 6pm - Pager - [phone]  After 6pm go to [email or web address] - password [email or web address] [facility]  Office  [phone]  CC: Primary care physician; Clinic, [facility]  Note: This dictation was prepared with Dragon dictation along with smaller phrase technology. Any transcriptional errors that result from this process are unintentional.

## 2019-07-07 NOTE — Progress Notes (Signed)
ANTICOAGULATION CONSULT NOTE -  Pharmacy Consult for Heparin  Indication: chest pain/ACS  Allergies  Allergen Reactions  . Nystatin Hives and Itching  . Sulfa Antibiotics Swelling, Hives and Rash  . Niaspan [Niacin] Other (See Comments) and Hives    Patient Measurements: Height: 5\' 6"  (167.6 cm) Weight: 217 lb 4.8 oz (98.6 kg) IBW/kg (Calculated) : 59.3 Heparin Dosing Weight: 82 kg   Vital Signs: Temp: 98.6 F (37 C) (10/18 1630) Temp Source: Oral (10/18 1630) BP: 116/57 (10/18 1630) Pulse Rate: 64 (10/18 1630)  Labs: Recent Labs    07/06/19 0756 07/06/19 1039  07/07/19 0147 07/07/19 0440 07/07/19 0620 07/07/19 0909 07/07/19 1725  HGB 8.7*  --   --   --  8.4*  --   --   --   HCT 27.8*  --   --   --  26.9*  --   --   --   PLT 209  --   --   --  193  --   --   --   APTT 39*  --   --   --   --   --   --   --   LABPROT 13.8  --   --   --   --   --   --   --   INR 1.1  --   --   --   --   --   --   --   HEPARINUNFRC  --   --    < > 0.27*  --   --  0.30 0.19*  CREATININE 8.95*  --   --   --  4.99*  --   --   --   TROPONINIHS 16 17  --   --  16 20*  --   --    < > = values in this interval not displayed.   Estimated Creatinine Clearance: 12.8 mL/min (A) (by C-G formula based on SCr of 4.99 mg/dL (H)).  Assessment: 68 yo female here with chest pain starting on heparin drip. No oral anticoagulants noted on PTA med list.   Goal of Therapy:  Heparin level 0.3-0.7 units/ml Monitor platelets by anticoagulation protocol: Yes   Plan:  Started Heparin bolus 4000 units IV x1 then heparin drip at 1000 units/hr (=10 ml/hr)  10/17 @ 1827 HL = 0.65 therapeutic x 1 10/18 @ 0147 HL = 0.27, subtherapeutic slightly below goal, significant decrease from previous level, nurse confirmed that heparin infusion continued to run during dialysis. 10/18 0909 HL 0.3 therapeutic @ 1100 units/hr  Plan: 10/18 1725 HL 0.19 - subtherapeutic  Will give bolus of 2500 units and increase dose to  1300 units/hr and recheck in 6 hours per protocol  Pharmacy will continue to follow.   Lu Duffel, PharmD Clinical Pharmacist 07/07/2019 6:16 PM

## 2019-07-07 NOTE — Consult Note (Signed)
Reason for Consult: Recent amputation partial right fifth toe with increased pain Referring Physician: [name] is an 68 y.o. female.  HPI: This is a 68 year old female recently admitted with chest pain.  Had recent bypass and her right lower extremity.  Also recent amputation a few months ago of her right fifth toe.  Increased pain in the right fifth toe over the last week or so.  Denies any bleeding or drainage.  She has followed up recently with her podiatrist.  Past Medical History:  Diagnosis Date  . Asthma   . CAD (coronary artery disease)   . Chronic lower back pain   . COPD (chronic obstructive pulmonary disease) ([place])   . Depression   . Diabetes mellitus without complication (HCC)    NIDD  . H/O angina pectoris   . H/O blood clots   . Hypercholesteremia   . Hypertension   . Left rotator cuff tear   . Myocardial infarction ([place])   . Obesity   . Renal insufficiency   . Stroke ([place])   . Vertigo, aural     Past Surgical History:  Procedure Laterality Date  . A/V FISTULAGRAM Left 03/14/2017   Procedure: A/V Fistulagram;  Surgeon: Katha Cabal, MD;  Location: [facility];  Service: Cardiovascular;  Laterality: Left;  . AV FISTULA PLACEMENT Left 11-04-2015  . AV FISTULA PLACEMENT Left 11/04/2015   Procedure: ARTERIOVENOUS (AV) FISTULA CREATION  ( BRACHIAL CEPHALIC );  Surgeon: Katha Cabal, MD;  Location: [facility];  Service: Vascular;  Laterality: Left;  . CARDIAC CATHETERIZATION    . CARDIAC CATHETERIZATION Left 07/19/2016   Procedure: Left Heart Cath and Coronary Angiography;  Surgeon: Corey Skains, MD;  Location: [facility];  Service: Cardiovascular;  Laterality: Left;  . CORONARY ARTERY BYPASS GRAFT  2012  . CORONARY STENT PLACEMENT    . JOINT REPLACEMENT    . KNEE SURGERY Bilateral   . LEFT HEART CATH AND CORONARY ANGIOGRAPHY N/A 12/25/2017   Procedure: LEFT HEART CATH AND CORONARY ANGIOGRAPHY;  Surgeon: Teodoro Spray, MD;   Location: [facility];  Service: Cardiovascular;  Laterality: N/A;  . LEFT HEART CATH AND CORONARY ANGIOGRAPHY Right 03/23/2018   Procedure: LEFT HEART CATH AND CORONARY ANGIOGRAPHY;  Surgeon: Dionisio David, MD;  Location: [facility];  Service: Cardiovascular;  Laterality: Right;  . LEFT HEART CATH AND CORONARY ANGIOGRAPHY N/A 05/30/2018   Procedure: LEFT HEART CATH AND CORONARY ANGIOGRAPHY;  Surgeon: Corey Skains, MD;  Location: [facility];  Service: Cardiovascular;  Laterality: N/A;  . PERIPHERAL VASCULAR CATHETERIZATION N/A 11/13/2015   Procedure: Dialysis/Perma Catheter Insertion;  Surgeon: Katha Cabal, MD;  Location: [facility];  Service: Cardiovascular;  Laterality: N/A;  . PERIPHERAL VASCULAR CATHETERIZATION N/A 12/04/2015   Procedure: Dialysis/Perma Catheter Insertion;  Surgeon: Katha Cabal, MD;  Location: [facility];  Service: Cardiovascular;  Laterality: N/A;  . PERIPHERAL VASCULAR CATHETERIZATION Left 12/31/2015   Procedure: A/V Shuntogram/Fistulagram;  Surgeon: Algernon Huxley, MD;  Location: [facility];  Service: Cardiovascular;  Laterality: Left;  . PERIPHERAL VASCULAR CATHETERIZATION N/A 12/31/2015   Procedure: A/V Shunt Intervention;  Surgeon: Algernon Huxley, MD;  Location: [facility];  Service: Cardiovascular;  Laterality: N/A;  . PERIPHERAL VASCULAR CATHETERIZATION Left 02/25/2016   Procedure: A/V Shuntogram/Fistulagram;  Surgeon: Algernon Huxley, MD;  Location: [facility];  Service: Cardiovascular;  Laterality: Left;  .  PERIPHERAL VASCULAR CATHETERIZATION N/A 03/18/2016   Procedure: Dialysis/Perma Catheter Removal;  Surgeon: Katha Cabal, MD;  Location: [facility];  Service: Cardiovascular;  Laterality: N/A;  . REMOVAL OF A DIALYSIS CATHETER N/A 06/30/2017   Procedure: REMOVAL OF A DIALYSIS CATHETER;  Surgeon: Katha Cabal, MD;  Location: [facility];  Service: Vascular;  Laterality:  N/A;  . REPLACEMENT TOTAL KNEE BILATERAL Bilateral     Family History  Problem Relation Age of Onset  . Cancer Mother   . Cancer Father   . Diabetes Brother   . Heart disease Brother     Social History:  reports that she has been smoking cigarettes. She has been smoking about 0.25 packs per day. She has never used smokeless tobacco. She reports that she does not drink alcohol or use drugs.  Allergies:  Allergies  Allergen Reactions  . Nystatin Hives and Itching  . Sulfa Antibiotics Swelling, Hives and Rash  . Niaspan [Niacin] Other (See Comments) and Hives    Medications:  Scheduled: . amLODipine  10 mg Oral Daily  . aspirin  81 mg Oral Daily  . atorvastatin  40 mg Oral QHS  . calcium carbonate  2 tablet Oral TID  . Chlorhexidine Gluconate Cloth  6 each Topical Q0600  . cinacalcet  30 mg Oral Q breakfast  . clopidogrel  75 mg Oral Daily  . docusate sodium  100 mg Oral BID  . fluticasone  2 spray Each Nare BID  . folic acid  1 mg Oral Daily  . isosorbide mononitrate  30 mg Oral Daily  . loratadine  10 mg Oral Daily  . lubiprostone  24 mcg Oral BID WC  . metoprolol succinate  100 mg Oral Q breakfast  . mometasone-formoterol  2 puff Inhalation BID  . multivitamin  1 tablet Oral QHS  . nicotine  21 mg Transdermal Daily  . pantoprazole  40 mg Oral BID  . polyethylene glycol  17 g Oral Daily  . ranolazine  500 mg Oral BID  . sevelamer carbonate  2,400 mg Oral TID WC  . tiotropium  1 capsule Inhalation Daily  . torsemide  100 mg Oral Daily  . venlafaxine XR  37.5 mg Oral Q breakfast    Results for orders placed or performed during the hospital encounter of 07/06/19 (from the past 48 hour(s))  Comprehensive metabolic panel     Status: Abnormal   Collection Time: 07/06/19  7:56 AM  Result Value Ref Range   Sodium 137 135 - 145 mmol/L   Potassium 4.3 3.5 - 5.1 mmol/L   Chloride 93 (L) 98 - 111 mmol/L   CO2 27 22 - 32 mmol/L   Glucose, Bld 123 (H) 70 - 99 mg/dL   BUN  53 (H) 8 - 23 mg/dL   Creatinine, Ser 8.95 (H) 0.44 - 1.00 mg/dL   Calcium 7.9 (L) 8.9 - 10.3 mg/dL   Total Protein 7.6 6.5 - 8.1 g/dL   Albumin 3.3 (L) 3.5 - 5.0 g/dL   AST 13 (L) 15 - 41 U/L   ALT 7 0 - 44 U/L   Alkaline Phosphatase 102 38 - 126 U/L   Total Bilirubin 0.7 0.3 - 1.2 mg/dL   GFR calc non Af Amer 4 (L) >60 mL/min   GFR calc Af Amer 5 (L) >60 mL/min   Anion gap 17 (H) 5 - 15    Comment: Performed at [facility], [address], [place], [place] [address]  Troponin I (High Sensitivity)     Status: None   Collection Time: 07/06/19  7:56 AM  Result Value Ref Range   Troponin I (High Sensitivity) 16 <18 ng/L    Comment: (NOTE) Elevated high sensitivity troponin I (hsTnI) values and significant  changes across serial measurements may suggest ACS but many other  chronic and acute conditions are known to elevate hsTnI results.  Refer to the "Links" section for chest pain algorithms and additional  guidance. Performed at [facility] [place], [place] [address]   CBC with Differential     Status: Abnormal   Collection Time: 07/06/19  7:56 AM  Result Value Ref Range   WBC 6.5 4.0 - 10.5 K/uL   RBC 2.72 (L) 3.87 - 5.11 MIL/uL   Hemoglobin 8.7 (L) 12.0 - 15.0 g/dL   HCT 27.8 (L) 36.0 - 46.0 %   MCV 102.2 (H) 80.0 - 100.0 fL   MCH 32.0 26.0 - 34.0 pg   MCHC 31.3 30.0 - 36.0 g/dL   RDW 15.2 11.5 - 15.5 %   Platelets 209 150 - 400 K/uL   nRBC 0.0 0.0 - 0.2 %   Neutrophils Relative % 60 %   Neutro Abs 4.0 1.7 - 7.7 K/uL   Lymphocytes Relative 21 %   Lymphs Abs 1.3 0.7 - 4.0 K/uL   Monocytes Relative 9 %   Monocytes Absolute 0.6 0.1 - 1.0 K/uL   Eosinophils Relative 8 %   Eosinophils Absolute 0.5 0.0 - 0.5 K/uL   Basophils Relative 1 %   Basophils Absolute 0.1 0.0 - 0.1 K/uL   Immature Granulocytes 1 %   Abs Immature Granulocytes 0.03 0.00 - 0.07 K/uL    Comment: Performed at [facility], [address]  [place], [place] [address]  Protime-INR     Status: None   Collection Time: 07/06/19  7:56 AM  Result Value Ref Range   Prothrombin Time 13.8 11.4 - 15.2 seconds   INR 1.1 0.8 - 1.2    Comment: (NOTE) INR goal varies based on device and disease states. Performed at [facility], [place], [place] [address]   APTT     Status: Abnormal   Collection Time: 07/06/19  7:56 AM  Result Value Ref Range   aPTT 39 (H) 24 - 36 seconds    Comment:        IF BASELINE aPTT IS ELEVATED, SUGGEST PATIENT RISK ASSESSMENT BE USED TO DETERMINE APPROPRIATE ANTICOAGULANT THERAPY. Performed at [facility], [address] [place], [place] [address]   Brain natriuretic peptide     Status: Abnormal   Collection Time: 07/06/19  7:56 AM  Result Value Ref Range   B Natriuretic Peptide 271.0 (H) 0.0 - 100.0 pg/mL    Comment: Performed at [facility], [address] [place], [place] [address]  Lipid panel     Status: None   Collection Time: 07/06/19  7:56 AM  Result Value Ref Range   Cholesterol 137 0 - 200 mg/dL   Triglycerides 115 <150 mg/dL   HDL 41 >40 mg/dL   Total CHOL/HDL Ratio 3.3 RATIO   VLDL 23 0 - 40 mg/dL   LDL Cholesterol 73 0 - 99 mg/dL    Comment:        Total Cholesterol/HDL:CHD Risk Coronary Heart Disease Risk Table  Men   Women  1/2 Average Risk   3.4   3.3  Average Risk       5.0   4.4  2 X Average Risk   9.6   7.1  3 X Average Risk  23.4   11.0        Use the calculated Patient Ratio above and the CHD Risk Table to determine the patient's CHD Risk.        ATP III CLASSIFICATION (LDL):  <100     mg/dL   Optimal  100-129  mg/dL   Near or Above                    Optimal  130-159  mg/dL   Borderline  160-189  mg/dL   High  >190     mg/dL   Very High Performed at [facility], [place], [place] [address]   Troponin I (High Sensitivity)     Status: None   Collection Time: 07/06/19 10:39 AM   Result Value Ref Range   Troponin I (High Sensitivity) 17 <18 ng/L    Comment: (NOTE) Elevated high sensitivity troponin I (hsTnI) values and significant  changes across serial measurements may suggest ACS but many other  chronic and acute conditions are known to elevate hsTnI results.  Refer to the "Links" section for chest pain algorithms and additional  guidance. Performed at [facility], [place], [place] [address]   SARS CORONAVIRUS 2 (TAT 6-24 HRS) Nasopharyngeal Nasopharyngeal Swab     Status: None   Collection Time: 07/06/19 10:46 AM   Specimen: Nasopharyngeal Swab  Result Value Ref Range   SARS Coronavirus 2 NEGATIVE NEGATIVE    Comment: (NOTE) SARS-CoV-2 target nucleic acids are NOT DETECTED. The SARS-CoV-2 RNA is generally detectable in upper and lower respiratory specimens during the acute phase of infection. Negative results do not preclude SARS-CoV-2 infection, do not rule out co-infections with other pathogens, and should not be used as the sole basis for treatment or other patient management decisions. Negative results must be combined with clinical observations, patient history, and epidemiological information. The expected result is Negative. Fact Sheet for Patients: SugarRoll.be Fact Sheet for Healthcare Providers: https://www.woods-mathews.com/ This test is not yet approved or cleared by the [place] FDA and  has been authorized for detection and/or diagnosis of SARS-CoV-2 by FDA under an Emergency Use Authorization (EUA). This EUA will remain  in effect (meaning this test can be used) for the duration of the COVID-19 declaration under Section 56 4(b)(1) of the Act, 21 U.S.C. section 360bbb-3(b)(1), unless the authorization is terminated or revoked sooner. Performed at [facility], [place] [address], [place], [place] [address]   Phosphorus     Status: None   Collection Time:  07/06/19  2:55 PM  Result Value Ref Range   Phosphorus 4.4 2.5 - 4.6 mg/dL    Comment: Performed at [facility], [address] [place], [place] [address]  HIV Antibody (routine testing w rflx)     Status: None   Collection Time: 07/06/19  2:55 PM  Result Value Ref Range   HIV Screen 4th Generation wRfx NON REACTIVE NON REACTIVE    Comment: Performed at [facility], [place] [address], [place], [place] [address]  MRSA PCR Screening     Status: None   Collection Time: 07/06/19  5:31 PM   Specimen: Nasopharyngeal  Result Value Ref Range  MRSA by PCR NEGATIVE NEGATIVE    Comment:        The GeneXpert MRSA Assay (FDA approved for NASAL specimens only), is one component of a comprehensive MRSA colonization surveillance program. It is not intended to diagnose MRSA infection nor to guide or monitor treatment for MRSA infections. Performed at [facility], [place], [place] [address]   Heparin level (unfractionated)     Status: None   Collection Time: 07/06/19  6:27 PM  Result Value Ref Range   Heparin Unfractionated 0.65 0.30 - 0.70 IU/mL    Comment: (NOTE) If heparin results are below expected values, and patient dosage has  been confirmed, suggest follow up testing of antithrombin III levels. Performed at [facility], [place], [place] [address]   Heparin level (unfractionated)     Status: Abnormal   Collection Time: 07/07/19  1:47 AM  Result Value Ref Range   Heparin Unfractionated 0.27 (L) 0.30 - 0.70 IU/mL    Comment: (NOTE) If heparin results are below expected values, and patient dosage has  been confirmed, suggest follow up testing of antithrombin III levels. Performed at [facility], [address] [place], [place] [id number]   Basic metabolic panel     Status: Abnormal   Collection Time: 07/07/19  4:40 AM  Result Value Ref Range   Sodium 137 135 - 145 mmol/L   Potassium 3.2 (L) 3.5 - 5.1  mmol/L   Chloride 97 (L) 98 - 111 mmol/L   CO2 28 22 - 32 mmol/L   Glucose, Bld 148 (H) 70 - 99 mg/dL   BUN 21 8 - 23 mg/dL   Creatinine, Ser 4.99 (H) 0.44 - 1.00 mg/dL   Calcium 8.3 (L) 8.9 - 10.3 mg/dL   GFR calc non Af Amer 8 (L) >60 mL/min   GFR calc Af Amer 10 (L) >60 mL/min   Anion gap 12 5 - 15    Comment: Performed at [facility], [address] [place], [place] [address]  CBC     Status: Abnormal   Collection Time: 07/07/19  4:40 AM  Result Value Ref Range   WBC 6.7 4.0 - 10.5 K/uL   RBC 2.65 (L) 3.87 - 5.11 MIL/uL   Hemoglobin 8.4 (L) 12.0 - 15.0 g/dL   HCT 26.9 (L) 36.0 - 46.0 %   MCV 101.5 (H) 80.0 - 100.0 fL   MCH 31.7 26.0 - 34.0 pg   MCHC 31.2 30.0 - 36.0 g/dL   RDW 14.8 11.5 - 15.5 %   Platelets 193 150 - 400 K/uL   nRBC 0.0 0.0 - 0.2 %    Comment: Performed at [facility], [place], [place] [address]  Troponin I (High Sensitivity)     Status: None   Collection Time: 07/07/19  4:40 AM  Result Value Ref Range   Troponin I (High Sensitivity) 16 <18 ng/L    Comment: (NOTE) Elevated high sensitivity troponin I (hsTnI) values and significant  changes across serial measurements may suggest ACS but many other  chronic and acute conditions are known to elevate hsTnI results.  Refer to the "Links" section for chest pain algorithms and additional  guidance. Performed at [facility], [address] [place], [place] [address]   Troponin I (High Sensitivity)     Status: Abnormal   Collection Time: 07/07/19  6:20 AM  Result Value Ref Range   Troponin I (High Sensitivity) 20 (H) <  18 ng/L    Comment: (NOTE) Elevated high sensitivity troponin I (hsTnI) values and significant  changes across serial measurements may suggest ACS but many other  chronic and acute conditions are known to elevate hsTnI results.  Refer to the "Links" section for chest pain algorithms and additional  guidance. Performed at [facility],  [place], [place] [address]   Heparin level (unfractionated)     Status: None   Collection Time: 07/07/19  9:09 AM  Result Value Ref Range   Heparin Unfractionated 0.30 0.30 - 0.70 IU/mL    Comment: (NOTE) If heparin results are below expected values, and patient dosage has  been confirmed, suggest follow up testing of antithrombin III levels. Performed at [facility], [place],  [address]     Ct Angio Chest Pe W Or Wo Contrast  Result Date: 07/07/2019 CLINICAL DATA:  68 year old female with high probability of pulmonary embolism. Chest pain with increasing shortness of breath on exertion. EXAM: CT ANGIOGRAPHY CHEST WITH CONTRAST TECHNIQUE: Multidetector CT imaging of the chest was performed using the standard protocol during bolus administration of intravenous contrast. Multiplanar CT image reconstructions and MIPs were obtained to evaluate the vascular anatomy. CONTRAST:  65mL OMNIPAQUE IOHEXOL 350 MG/ML SOLN COMPARISON:  Chest CTA 12/17/2015. FINDINGS: Cardiovascular: No filling defects within the pulmonary arterial tree to suggest underlying pulmonary embolism. Heart size is enlarged with left atrial and left ventricular dilatation. There is no significant pericardial fluid, thickening or pericardial calcification. There is aortic atherosclerosis, as well as atherosclerosis of the great vessels of the mediastinum and the coronary arteries, including calcified atherosclerotic plaque in the left main, left anterior descending, left circumflex and right coronary arteries. Thickening calcification of the aortic valve. Dilatation of the pulmonic trunk (3.6 cm in diameter). Mediastinum/Nodes: No pathologically enlarged mediastinal or hilar lymph nodes. Esophagus is unremarkable in appearance. No axillary lymphadenopathy. Lungs/Pleura: Small pulmonary nodules in the right upper lobe measuring up to 5 mm (axial image 40 of series 6). No other larger more  suspicious appearing pulmonary nodules or masses are noted. No acute consolidative airspace disease. No pleural effusions. Upper Abdomen: 1.3 cm exophytic low-attenuation lesion in the anterior aspect of the upper pole the right kidney, compatible with a simple cyst. Aortic atherosclerosis. Musculoskeletal: Status post resection of the anterior aspect of the left fifth rib. There are no aggressive appearing lytic or blastic lesions noted in the visualized portions of the skeleton. Review of the MIP images confirms the above findings. IMPRESSION: 1. No evidence of pulmonary embolism. 2. No acute findings are noted in the thorax to account for the patient's symptoms. 3. Aortic atherosclerosis, in addition to left main and 3 vessel coronary artery disease. Please note that although the presence of coronary artery calcium documents the presence of coronary artery disease, the severity of this disease and any potential stenosis cannot be assessed on this non-gated CT examination. Assessment for potential risk factor modification, dietary therapy or pharmacologic therapy may be warranted, if clinically indicated. 4. Dilatation of the pulmonic trunk (3.6 cm in diameter), concerning for pulmonary arterial hypertension. 5. Cardiomegaly with left atrial and left ventricular dilatation. Electronically Signed   By: Vinnie Langton M.D.   On: 07/07/2019 12:36   Dg Chest Portable 1 View  Result Date: 07/06/2019 CLINICAL DATA:  Chest pain EXAM: PORTABLE CHEST 1 VIEW COMPARISON:  11/27/2018 FINDINGS: Mild bibasilar scarring/atelectasis. No focal consolidation. No pleural effusion or pneumothorax. The heart is top-normal in size.  Thoracic aortic atherosclerosis. IMPRESSION: No evidence of acute cardiopulmonary disease. Electronically Signed   By: Julian Hy M.D.   On: 07/06/2019 08:26    Review of Systems  Constitutional: Negative for chills and fever.  HENT: Negative for congestion and sinus pain.   Eyes:  Negative for blurred vision and double vision.  Respiratory: Negative for cough and shortness of breath.   Cardiovascular: Positive for chest pain.  Gastrointestinal: Negative for nausea and vomiting.  Genitourinary: Negative for dysuria and urgency.  Musculoskeletal:       Previous partial amputation of the right fifth toe.  Patient relates significant increase in pain over the last week.  Skin:       Patient relates some scabbing discoloration on her right fifth toe.  Recent amputation.  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  Endo/Heme/Allergies: Does not bruise/bleed easily.  Psychiatric/Behavioral: Negative for depression.   Blood pressure 139/67, pulse 75, temperature 97.9 F (36.6 C), resp. rate 18, height 5\' 6"  (1.676 m), weight 98.6 kg, SpO2 100 %. Physical Exam  Cardiovascular:  DP and PT pulses are palpable bilateral.  Diminished somewhat on the left.  Musculoskeletal:     Comments: Previous partial amputation of the right fifth toe.  Some pain on palpation.  Adequate range of motion of the pedal joints.  Muscle testing deferred.  Neurological:  There does appear to be some loss of protective threshold with a monofilament wire in the forefoot and toes.  Proprioception still appears to be intact.  Skin:  The skin is warm dry and overall fairly supple.  Some significant hyperkeratosis is noted at the right fifth toe amputation site.  Upon paring of the hyperkeratosis there is no underlying ulceration or abscess noted.  No significant cellulitis.    Assessment/Plan: Assessment: 1.  Diabetes with peripheral vascular disease. 2.  Status post amputation right fifth toe, stable.  Plan: Paring of the hyperkeratotic tissue over the amputation site on the fifth toe.  Patient did notice some immediate improvement in her pain.  Discussed with the patient that her amputation appears stable and just recommended outpatient follow-up with  her vascular surgeon as well as her podiatrist who performed her amputation.  No need for any further debridement or amputation at this point.  Discussed with the patient that some of her increased pain could be due to the increased blood flow from her revascularization.  Durward Fortes 07/07/2019, 1:05 PM

## 2019-07-07 NOTE — Progress Notes (Signed)
[facility]  ROUNDING NOTE   Subjective:  Patient well-known to [place]. We follow her for management of ESRD. Came in with intermittent chest pain yesterday. Cardiology consulted. Currently on heparin drip. Recently had right profunda/popliteal bypass performed on September 28.  Objective:  Vital signs in last 24 hours:  Temp:  [97.9 F (36.6 C)-99.5 F (37.5 C)] 97.9 F (36.6 C) (10/18 0800) Pulse Rate:  [63-103] 75 (10/18 0800) Resp:  [13-30] 18 (10/18 0800) BP: (105-165)/(53-83) 139/67 (10/18 0800) SpO2:  [93 %-100 %] 100 % (10/18 0800) Weight:  [98.6 kg-98.7 kg] 98.6 kg (10/18 0416)  Weight change:  Filed Weights   07/06/19 2115 07/06/19 2138 07/07/19 0416  Weight: 98.7 kg 98.7 kg 98.6 kg    Intake/Output: I/O last 3 completed shifts: In: 552.8 [P.O.:360; I.V.:142.8; IV Piggyback:50] Out: [id number] [Other:1281]   Intake/Output this shift:  No intake/output data recorded.  Physical Exam: General: No acute distress  Head: Normocephalic, atraumatic. Moist oral mucosal membranes  Eyes: Anicteric  Neck: Supple, trachea midline  Lungs:  Clear to auscultation, normal effort  Heart: S1S2 no rubs  Abdomen:  Soft, nontender, bowel sounds present  Extremities: Trace peripheral edema, long surgical scar RLE  Neurologic: Awake, alert, following commands  Skin: No lesions  Access: LUE AVF    Basic Metabolic Panel: Recent Labs  Lab 07/06/19 0756 07/06/19 1455 07/07/19 0440  NA 137  --  137  K 4.3  --  3.2*  CL 93*  --  97*  CO2 27  --  28  GLUCOSE 123*  --  148*  BUN 53*  --  21  CREATININE 8.95*  --  4.99*  CALCIUM 7.9*  --  8.3*  PHOS  --  4.4  --     Liver Function Tests: Recent Labs  Lab 07/06/19 0756  AST 13*  ALT 7  ALKPHOS 102  BILITOT 0.7  PROT 7.6  ALBUMIN 3.3*   No results for input(s): LIPASE, AMYLASE in the last 168 hours. No results for input(s): AMMONIA in the last 168 hours.  CBC: Recent Labs  Lab 07/06/19 0756  07/07/19 0440  WBC 6.5 6.7  NEUTROABS 4.0  --   HGB 8.7* 8.4*  HCT 27.8* 26.9*  MCV 102.2* 101.5*  PLT 209 193    Cardiac Enzymes: No results for input(s): CKTOTAL, CKMB, CKMBINDEX, TROPONINI in the last 168 hours.  BNP: Invalid input(s): POCBNP  CBG: No results for input(s): GLUCAP in the last 168 hours.  Microbiology: Results for orders placed or performed during the hospital encounter of 07/06/19  SARS CORONAVIRUS 2 (TAT 6-24 HRS) Nasopharyngeal Nasopharyngeal Swab     Status: None   Collection Time: 07/06/19 10:46 AM   Specimen: Nasopharyngeal Swab  Result Value Ref Range Status   SARS Coronavirus 2 NEGATIVE NEGATIVE Final    Comment: (NOTE) SARS-CoV-2 target nucleic acids are NOT DETECTED. The SARS-CoV-2 RNA is generally detectable in upper and lower respiratory specimens during the acute phase of infection. Negative results do not preclude SARS-CoV-2 infection, do not rule out co-infections with other pathogens, and should not be used as the sole basis for treatment or other patient management decisions. Negative results must be combined with clinical observations, patient history, and epidemiological information. The expected result is Negative. Fact Sheet for Patients: SugarRoll.be Fact Sheet for Healthcare Providers: https://www.woods-mathews.com/ This test is not yet approved or cleared by the [place] FDA and  has been authorized for detection and/or diagnosis of SARS-CoV-2 by FDA under  an Emergency Use Authorization (EUA). This EUA will remain  in effect (meaning this test can be used) for the duration of the COVID-19 declaration under Section 56 4(b)(1) of the Act, 21 U.S.C. section 360bbb-3(b)(1), unless the authorization is terminated or revoked sooner. Performed at [facility], [place] [address], [place], [place] [address]   MRSA PCR Screening     Status: None   Collection Time: 07/06/19  5:31 PM    Specimen: Nasopharyngeal  Result Value Ref Range Status   MRSA by PCR NEGATIVE NEGATIVE Final    Comment:        The GeneXpert MRSA Assay (FDA approved for NASAL specimens only), is one component of a comprehensive MRSA colonization surveillance program. It is not intended to diagnose MRSA infection nor to guide or monitor treatment for MRSA infections. Performed at [facility], [address] [place], [place] [address]     Coagulation Studies: Recent Labs    07/06/19 0756  LABPROT 13.8  INR 1.1    Urinalysis: No results for input(s): COLORURINE, LABSPEC, PHURINE, GLUCOSEU, HGBUR, BILIRUBINUR, KETONESUR, PROTEINUR, UROBILINOGEN, NITRITE, LEUKOCYTESUR in the last 72 hours.  Invalid input(s): APPERANCEUR    Imaging: Ct Angio Chest Pe W Or Wo Contrast  Result Date: 07/07/2019 CLINICAL DATA:  68 year old female with high probability of pulmonary embolism. Chest pain with increasing shortness of breath on exertion. EXAM: CT ANGIOGRAPHY CHEST WITH CONTRAST TECHNIQUE: Multidetector CT imaging of the chest was performed using the standard protocol during bolus administration of intravenous contrast. Multiplanar CT image reconstructions and MIPs were obtained to evaluate the vascular anatomy. CONTRAST:  15mL OMNIPAQUE IOHEXOL 350 MG/ML SOLN COMPARISON:  Chest CTA 12/17/2015. FINDINGS: Cardiovascular: No filling defects within the pulmonary arterial tree to suggest underlying pulmonary embolism. Heart size is enlarged with left atrial and left ventricular dilatation. There is no significant pericardial fluid, thickening or pericardial calcification. There is aortic atherosclerosis, as well as atherosclerosis of the great vessels of the mediastinum and the coronary arteries, including calcified atherosclerotic plaque in the left main, left anterior descending, left circumflex and right coronary arteries. Thickening calcification of the aortic valve. Dilatation of the pulmonic  trunk (3.6 cm in diameter). Mediastinum/Nodes: No pathologically enlarged mediastinal or hilar lymph nodes. Esophagus is unremarkable in appearance. No axillary lymphadenopathy. Lungs/Pleura: Small pulmonary nodules in the right upper lobe measuring up to 5 mm (axial image 40 of series 6). No other larger more suspicious appearing pulmonary nodules or masses are noted. No acute consolidative airspace disease. No pleural effusions. Upper Abdomen: 1.3 cm exophytic low-attenuation lesion in the anterior aspect of the upper pole the right kidney, compatible with a simple cyst. Aortic atherosclerosis. Musculoskeletal: Status post resection of the anterior aspect of the left fifth rib. There are no aggressive appearing lytic or blastic lesions noted in the visualized portions of the skeleton. Review of the MIP images confirms the above findings. IMPRESSION: 1. No evidence of pulmonary embolism. 2. No acute findings are noted in the thorax to account for the patient's symptoms. 3. Aortic atherosclerosis, in addition to left main and 3 vessel coronary artery disease. Please note that although the presence of coronary artery calcium documents the presence of coronary artery disease, the severity of this disease and any potential stenosis cannot be assessed on this non-gated CT examination. Assessment for potential risk factor modification, dietary therapy or pharmacologic therapy may be warranted, if clinically indicated. 4. Dilatation of the pulmonic trunk (3.6 cm in diameter), concerning for pulmonary arterial hypertension.  5. Cardiomegaly with left atrial and left ventricular dilatation. Electronically Signed   By: Vinnie Langton M.D.   On: 07/07/2019 12:36   Dg Chest Portable 1 View  Result Date: 07/06/2019 CLINICAL DATA:  Chest pain EXAM: PORTABLE CHEST 1 VIEW COMPARISON:  11/27/2018 FINDINGS: Mild bibasilar scarring/atelectasis. No focal consolidation. No pleural effusion or pneumothorax. The heart is top-normal  in size.  Thoracic aortic atherosclerosis. IMPRESSION: No evidence of acute cardiopulmonary disease. Electronically Signed   By: Julian Hy M.D.   On: 07/06/2019 08:26     Medications:   . sodium chloride Stopped (07/07/19 0516)  . heparin 1,100 Units/hr (07/07/19 0240)   . amLODipine  10 mg Oral Daily  . aspirin  81 mg Oral Daily  . atorvastatin  40 mg Oral QHS  . calcium carbonate  2 tablet Oral TID  . Chlorhexidine Gluconate Cloth  6 each Topical Q0600  . cinacalcet  30 mg Oral Q breakfast  . clopidogrel  75 mg Oral Daily  . docusate sodium  100 mg Oral BID  . feeding supplement (NEPRO CARB STEADY)  237 mL Oral BID BM  . fluticasone  2 spray Each Nare BID  . folic acid  1 mg Oral Daily  . isosorbide mononitrate  30 mg Oral Daily  . loratadine  10 mg Oral Daily  . lubiprostone  24 mcg Oral BID WC  . metoprolol succinate  100 mg Oral Q breakfast  . mometasone-formoterol  2 puff Inhalation BID  . multivitamin  1 tablet Oral QHS  . nicotine  21 mg Transdermal Daily  . pantoprazole  40 mg Oral BID  . polyethylene glycol  17 g Oral Daily  . ranolazine  500 mg Oral BID  . sevelamer carbonate  2,400 mg Oral TID WC  . tiotropium  1 capsule Inhalation Daily  . torsemide  100 mg Oral Daily  . venlafaxine XR  37.5 mg Oral Q breakfast   acetaminophen, albuterol, albuterol, docusate sodium, labetalol, lidocaine-prilocaine, meclizine, morphine injection, ondansetron, oxyCODONE  Assessment/ Plan:  68 y.o. female with end stage renal disease on hemodialysis, coronary artery disease status post CABG, hypertension, peripheral vascular disease, diabetes mellitus type 2, hyperlipidemia, bilateral total knee replacement, PD catheter removal June 30, 2017, s/p PCI mLAD 03/2018, RLE profunda/popliteal bypass now admitted with chest pain.   [facility]/TTS/LUE AVF/98kg  1.  ESRD on HD TTS.  Patient underwent hemodialysis yesterday here at the hospital.  Tolerated this well.  No  urgent indication for dialysis today.  2.  Anemia of chronic kidney disease.  Hemoglobin currently 8.4.  Resume Epogen with next dialysis treatment.  3.  Secondary hyperparathyroidism.  Phosphorus 4.4 and at target.  Maintain the patient on Renvela 2400 mg p.o. 3 times daily with meals.  We will maintain the patient on Sensipar as well.  4.  Hypertension.  Maintain the patient on amlodipine and metoprolol.   LOS: 0 Sahasra Belue 10/18/20202:25 PM

## 2019-07-07 NOTE — Progress Notes (Signed)
Patient Name: Kimberly Shaffer Date of Encounter: 07/07/2019  Hospital Problem List     Principal Problem:   Ischemic chest pain [facility]) Active Problems:   Chest pain    Patient Profile     68 with history of cad with extensive pci history and cabg x 1 , extensive pvd s/p right femoral to popliteal revascularization last week at [facility], history of esrd on hd who had chest pain on hd. She wsa admitted and ruled out for mi. Last pm had arm pain followed by chest pain. EKG showed no injury current. Troponin drawn during this event was not significantly elevated. She is pain free this am.   Subjective   No chest pain this am.  Inpatient Medications    . amLODipine  10 mg Oral Daily  . aspirin  81 mg Oral Daily  . atorvastatin  40 mg Oral QHS  . calcium carbonate  2 tablet Oral TID  . Chlorhexidine Gluconate Cloth  6 each Topical Q0600  . cinacalcet  30 mg Oral Q breakfast  . clopidogrel  75 mg Oral Daily  . docusate sodium  100 mg Oral BID  . fluticasone  2 spray Each Nare BID  . fluticasone furoate-vilanterol  1 puff Inhalation Daily  . folic acid  1 mg Oral Daily  . isosorbide mononitrate  30 mg Oral Daily  . loratadine  10 mg Oral Daily  . lubiprostone  24 mcg Oral BID WC  . metoprolol succinate  100 mg Oral Q breakfast  . mometasone-formoterol  2 puff Inhalation BID  . multivitamin  1 tablet Oral QHS  . pantoprazole  40 mg Oral BID  . polyethylene glycol  17 g Oral Daily  . ranolazine  500 mg Oral BID  . sevelamer carbonate  2,400 mg Oral TID WC  . tiotropium  1 capsule Inhalation Daily  . torsemide  100 mg Oral Daily  . venlafaxine XR  37.5 mg Oral Q breakfast    Vital Signs    Vitals:   07/07/19 0414 07/07/19 0416 07/07/19 0422 07/07/19 0800  BP: (!) 150/65  120/64 139/67  Pulse: 87  87 75  Resp:    18  Temp:    97.9 F (36.6 C)  TempSrc:      SpO2: 97%  99% 100%  Weight:  98.6 kg    Height:        Intake/Output Summary (Last 24 hours) at 07/07/2019  1007 Last data filed at 07/07/2019 0300 Gross per 24 hour  Intake 552.77 ml  Output 1281 ml  Net -728.23 ml   Filed Weights   07/06/19 2115 07/06/19 2138 07/07/19 0416  Weight: 98.7 kg 98.7 kg 98.6 kg    Physical Exam    GEN: Well nourished, well developed, in no acute distress.  HEENT: normal.  Neck: Supple, no JVD, carotid bruits, or masses. Cardiac: RRR, no murmurs, rubs, or gallops. No clubbing, cyanosis, edema.  Radials/DP/PT 2+ and equal bilaterally.  Respiratory:  Respirations regular and unlabored, clear to auscultation bilaterally. GI: Soft, nontender, nondistended, BS + x 4. MS: no deformity or atrophy. Skin: warm and dry, no rash. Neuro:  Strength and sensation are intact. Psych: Normal affect.  Labs    CBC Recent Labs    07/06/19 0756 07/07/19 0440  WBC 6.5 6.7  NEUTROABS 4.0  --   HGB 8.7* 8.4*  HCT 27.8* 26.9*  MCV 102.2* 101.5*  PLT 209 0000000   Basic Metabolic Panel Recent Labs  07/06/19 0756 07/06/19 1455 07/07/19 0440  NA 137  --  137  K 4.3  --  3.2*  CL 93*  --  97*  CO2 27  --  28  GLUCOSE 123*  --  148*  BUN 53*  --  21  CREATININE 8.95*  --  4.99*  CALCIUM 7.9*  --  8.3*  PHOS  --  4.4  --    Liver Function Tests Recent Labs    07/06/19 0756  AST 13*  ALT 7  ALKPHOS 102  BILITOT 0.7  PROT 7.6  ALBUMIN 3.3*   No results for input(s): LIPASE, AMYLASE in the last 72 hours. Cardiac Enzymes No results for input(s): CKTOTAL, CKMB, CKMBINDEX, TROPONINI in the last 72 hours. BNP Recent Labs    07/06/19 0756  BNP 271.0*   D-Dimer No results for input(s): DDIMER in the last 72 hours. Hemoglobin A1C No results for input(s): HGBA1C in the last 72 hours. Fasting Lipid Panel Recent Labs    07/06/19 0756  CHOL 137  HDL 41  LDLCALC 73  TRIG 115  CHOLHDL 3.3   Thyroid Function Tests No results for input(s): TSH, T4TOTAL, T3FREE, THYROIDAB in the last 72 hours.  Invalid input(s): FREET3  Telemetry    nsr  ECG     nsr with lvh  Radiology    Dg Chest Portable 1 View  Result Date: 07/06/2019 CLINICAL DATA:  Chest pain EXAM: PORTABLE CHEST 1 VIEW COMPARISON:  11/27/2018 FINDINGS: Mild bibasilar scarring/atelectasis. No focal consolidation. No pleural effusion or pneumothorax. The heart is top-normal in size.  Thoracic aortic atherosclerosis. IMPRESSION: No evidence of acute cardiopulmonary disease. Electronically Signed   By: Julian Hy M.D.   On: 07/06/2019 08:26    Assessment & Plan    Chest pian-has diffuse cad. Pain concerning for cad but has ruled out for mi. Had similar pain while at [facility] last week treated medically. Currently does not appear ischemic. Will continue with current meds. CT of chest did not show pe. No acute findings. No evidence of acute aortitis. Will consiser discontinuing heparin if she remains stable over night.  PVD-being followed at [facility] s/p right fem-pop. Currently stable  ESRD-continue hd.   Signed, Javier Docker Dracen Reigle MD 07/07/2019, 10:07 AM  Pager: [phone]

## 2019-07-07 NOTE — Progress Notes (Signed)
Initial Nutrition Assessment  DOCUMENTATION CODES:   Obesity unspecified  INTERVENTION:  Provide Nepro Shake po BID, each supplement provides 425 kcal and 19 grams protein. Patient prefers mixed berry or butter pecan.  Continue Rena-vite QHS.  NUTRITION DIAGNOSIS:   Increased nutrient needs related to catabolic illness(ESRD on HD, COPD) as evidenced by estimated needs.  GOAL:   Patient will meet greater than or equal to 90% of their needs  MONITOR:   PO intake, Supplement acceptance, Labs, Weight trends, Skin, I & O's  REASON FOR ASSESSMENT:   Malnutrition Screening Tool    ASSESSMENT:   68 year old female with PMHx of COPD, HTN, depression, hx CVA, hx MI, CAD, DM, asthma, ESRD on HD admitted with chest pain, also with necrotic tissue at upper thigh wound from recent vascular surgery.   Met with patient at bedside. She is known to our service from previous admissions. She reports her appetite has been decreased for the past few months. She is trying to eat but is taking in very small amounts at meals - occasionally only a bite or two. She receives protein shakes during dialysis. She is going to also speak with her RD at HD center about poor PO intake. She is amenable to drinking Nepro to help meet calorie/protein needs.  Patient reports her UBW was >220 lbs. Per chart she was 102.1 kg on 11/27/2018. She is now 98.6 kg (217.3 lbs). She has lost 3.5 kg (3.4% body weight) over the past 7 months, which is not significant for time frame.  Medications reviewed and include: Tums 2 tablets TID, cinacalcet 30 mg daily, Colace 213 mg BID, folic acid 1 mg daily, Rena-vite QHS, pantoprazole, Miralax 17 grams daily, Renvela 2400 mg TID, torsemide, heparin.  Labs reviewed: Potassium 3.2, Chloride 97, Creatinine 4.99.  NUTRITION - FOCUSED PHYSICAL EXAM:    Most Recent Value  Orbital Region  No depletion  Upper Arm Region  Mild depletion  Thoracic and Lumbar Region  No depletion  Buccal  Region  No depletion  Temple Region  No depletion  Clavicle Bone Region  No depletion  Clavicle and Acromion Bone Region  No depletion  Scapular Bone Region  No depletion  Dorsal Hand  No depletion  Patellar Region  No depletion  Anterior Thigh Region  No depletion  Posterior Calf Region  No depletion  Edema (RD Assessment)  Mild  Hair  Reviewed  Eyes  Reviewed  Mouth  Reviewed  Skin  Reviewed  Nails  Reviewed     Diet Order:   Diet Order            Diet Carb Modified Fluid consistency: Thin; Room service appropriate? Yes  Diet effective now             EDUCATION NEEDS:   No education needs have been identified at this time  Skin:  Skin Assessment: Skin Integrity Issues:(closed incision right leg)  Last BM:  07/05/2019 per chart  Height:   Ht Readings from Last 1 Encounters:  07/06/19 _0  (1.676 m)   Weight:   Wt Readings from Last 1 Encounters:  07/07/19 98.6 kg   Ideal Body Weight:  59.1 kg  BMI:  Body mass index is 35.07 kg/m.  Estimated Nutritional Needs:   Kcal:  1850-2050  Protein:  95-105 grams  Fluid:  UOP + 1 L  Willey Blade, MS, RD, LDN Office: [phone] Pager: [phone] After Hours/Weekend Pager: [phone]

## 2019-07-07 NOTE — Progress Notes (Signed)
HD Tx Completed:  HD Tx completed early d/t ECG changes as well as pt more obvious shaking. Pt denies chest pain, tightness, or SOB. No complaints of pain or discomfort. Vital signs consistent. ECG reading VFIB rhythm but rhythm appears as sinus tach/supra ventricular rhythm. Primary nurse notified and report given at bedside.   07/07/19 0100  Vital Signs  Temp 98.7 F (37.1 C)  Temp Source Oral  Pulse Rate 70  Resp 15  BP (!) 128/58  Oxygen Therapy  SpO2 100 %  Pain Assessment  Pain Scale 0-10  Pain Score 0  During Hemodialysis Assessment  Blood Flow Rate (mL/min) 400 mL/min  Arterial Pressure (mmHg) -160 mmHg  Venous Pressure (mmHg) 180 mmHg  Transmembrane Pressure (mmHg) 70 mmHg  Ultrafiltration Rate (mL/min) 580 mL/min  Dialysate Flow Rate (mL/min) 800 ml/min  Conductivity: Machine  13.9  HD Safety Checks Performed Yes  KECN 70.7 KECN  Dialysis Fluid Bolus Normal Saline  Bolus Amount (mL) 250 mL  Intra-Hemodialysis Comments Tx completed

## 2019-07-07 NOTE — Progress Notes (Signed)
ANTICOAGULATION CONSULT NOTE -  Pharmacy Consult for Heparin  Indication: chest pain/ACS  Allergies  Allergen Reactions  . Nystatin Hives and Itching  . Sulfa Antibiotics Swelling, Hives and Rash  . Niaspan [Niacin] Other (See Comments) and Hives    Patient Measurements: Height: 5\' 6"  (167.6 cm) Weight: 217 lb 9.5 oz (98.7 kg) IBW/kg (Calculated) : 59.3 Heparin Dosing Weight: 82 kg   Vital Signs: Temp: 98.7 F (37.1 C) (10/18 0105) Temp Source: Oral (10/18 0105) BP: 124/58 (10/18 0105) Pulse Rate: 77 (10/18 0105)  Labs: Recent Labs    07/06/19 0756 07/06/19 1039 07/06/19 1827 07/07/19 0147  HGB 8.7*  --   --   --   HCT 27.8*  --   --   --   PLT 209  --   --   --   APTT 39*  --   --   --   LABPROT 13.8  --   --   --   INR 1.1  --   --   --   HEPARINUNFRC  --   --  0.65 0.27*  CREATININE 8.95*  --   --   --   TROPONINIHS 16 17  --   --    Estimated Creatinine Clearance: 7.1 mL/min (A) (by C-G formula based on SCr of 8.95 mg/dL (H)).  Assessment: 68 yo female here with chest pain starting on heparin drip. No oral anticoagulants noted on PTA med list.   Goal of Therapy:  Heparin level 0.3-0.7 units/ml Monitor platelets by anticoagulation protocol: Yes   Plan:  Started Heparin bolus 4000 units IV x1 then heparin drip at 1000 units/hr (=10 ml/hr)  10/17 @ 1827 HL = 0.65 therapeutic x 1 10/18 @ 0147 HL = 0.27, subtherapeutic slightly below goal, significant decrease from previous level, nurse confirmed that heparin infusion continued to run during dialysis.  Plan: Will increase Heparin infusion to 1100 units/hr Recheck Heparin level in 6h CBC in AM  Pharmacy will continue to follow.   Ena Dawley, Clinical Pharmacist 07/07/2019 2:32 AM

## 2019-07-07 NOTE — Progress Notes (Signed)
A code STEMI was activated around 4:40 am. However, EKG didn't meet STEMI criteria and the patient was chest pain free after getting NTG. Thus, it was cancelled .

## 2019-07-07 NOTE — Significant Event (Signed)
Called to patient's room for c/o sudden chest pain with vomiting. Chest pain order set initiated. HR and RR elevated. MD Marcille Blanco notified. New orders received from Dr. Marcille Blanco. Dr. Marcille Blanco consulted Dr. Ubaldo Glassing. Dr. Ubaldo Glassing contacted Probation officer and informed writer to contact Dr. Fletcher Anon. Dr. Fletcher Anon contacted and informed write patient was a code STEMI. Dr. Fletcher Anon called back and acquired about the patient's pain which had then subsided after morphine, aspirin, and 3 rounds of nitro, and determined there was no need to take her to the cath lab. Patient is now sitting up in bed and praising God that she is feeling 100% better. Will continue to monitor.

## 2019-07-07 NOTE — Progress Notes (Signed)
ANTICOAGULATION CONSULT NOTE -  Pharmacy Consult for Heparin  Indication: chest pain/ACS  Allergies  Allergen Reactions  . Nystatin Hives and Itching  . Sulfa Antibiotics Swelling, Hives and Rash  . Niaspan [Niacin] Other (See Comments) and Hives    Patient Measurements: Height: 5\' 6"  (167.6 cm) Weight: 217 lb 4.8 oz (98.6 kg) IBW/kg (Calculated) : 59.3 Heparin Dosing Weight: 82 kg   Vital Signs: Temp: 97.9 F (36.6 C) (10/18 0800) Temp Source: Oral (10/18 0408) BP: 139/67 (10/18 0800) Pulse Rate: 75 (10/18 0800)  Labs: Recent Labs    07/06/19 0756 07/06/19 1039 07/06/19 1827 07/07/19 0147 07/07/19 0440 07/07/19 0620 07/07/19 0909  HGB 8.7*  --   --   --  8.4*  --   --   HCT 27.8*  --   --   --  26.9*  --   --   PLT 209  --   --   --  193  --   --   APTT 39*  --   --   --   --   --   --   LABPROT 13.8  --   --   --   --   --   --   INR 1.1  --   --   --   --   --   --   HEPARINUNFRC  --   --  0.65 0.27*  --   --  0.30  CREATININE 8.95*  --   --   --  4.99*  --   --   TROPONINIHS 16 17  --   --  16 20*  --    Estimated Creatinine Clearance: 12.8 mL/min (A) (by C-G formula based on SCr of 4.99 mg/dL (H)).  Assessment: 68 yo female here with chest pain starting on heparin drip. No oral anticoagulants noted on PTA med list.   Goal of Therapy:  Heparin level 0.3-0.7 units/ml Monitor platelets by anticoagulation protocol: Yes   Plan:  Started Heparin bolus 4000 units IV x1 then heparin drip at 1000 units/hr (=10 ml/hr)  10/17 @ 1827 HL = 0.65 therapeutic x 1 10/18 @ 0147 HL = 0.27, subtherapeutic slightly below goal, significant decrease from previous level, nurse confirmed that heparin infusion continued to run during dialysis.  Plan: 10/18 0909 HL 0.3 therapeutic. Will continue heparin drip 1100 units/hr. HL at 1700 to confirm.  Pharmacy will continue to follow.   Tawnya Crook, PharmD Clinical Pharmacist 07/07/2019 10:34 AM

## 2019-07-08 DIAGNOSIS — I25119 Atherosclerotic heart disease of native coronary artery with unspecified angina pectoris: Secondary | ICD-10-CM | POA: Diagnosis not present

## 2019-07-08 LAB — HEPARIN LEVEL (UNFRACTIONATED)
Heparin Unfractionated: 0.35 IU/mL (ref 0.30–0.70)
Heparin Unfractionated: 0.18 IU/mL — ABNORMAL LOW (ref 0.30–0.70)

## 2019-07-08 LAB — VITAMIN B12: Vitamin B-12: 451 pg/mL (ref 180–914)

## 2019-07-08 MED ORDER — OXYCODONE HCL 5 MG PO TABS
10.0000 mg | ORAL_TABLET | Freq: Four times a day (QID) | ORAL | Status: DC | PRN
  Administered 2019-07-08: 10 mg via ORAL
  Filled 2019-07-08: qty 2

## 2019-07-08 MED ORDER — HEPARIN BOLUS VIA INFUSION
1200.0000 [IU] | Freq: Once | INTRAVENOUS | Status: AC
  Administered 2019-07-08: 1200 [IU] via INTRAVENOUS
  Filled 2019-07-08: qty 1200

## 2019-07-08 MED ORDER — POTASSIUM CHLORIDE CRYS ER 20 MEQ PO TBCR
20.0000 meq | EXTENDED_RELEASE_TABLET | Freq: Once | ORAL | Status: AC
  Administered 2019-07-08: 20 meq via ORAL
  Filled 2019-07-08: qty 1

## 2019-07-08 NOTE — Progress Notes (Signed)
ANTICOAGULATION CONSULT NOTE -  Pharmacy Consult for Heparin  Indication: chest pain/ACS  Allergies  Allergen Reactions  . Nystatin Hives and Itching  . Sulfa Antibiotics Swelling, Hives and Rash  . Niaspan [Niacin] Other (See Comments) and Hives    Patient Measurements: Height: 5\' 6"  (167.6 cm) Weight: 217 lb 4.8 oz (98.6 kg) IBW/kg (Calculated) : 59.3 Heparin Dosing Weight: 82 kg   Vital Signs: Temp: 99 F (37.2 C) (10/18 1938) Temp Source: Oral (10/18 1938) BP: 113/67 (10/18 1938) Pulse Rate: 74 (10/18 1938)  Labs: Recent Labs    07/06/19 0756 07/06/19 1039  07/07/19 0440 07/07/19 0620 07/07/19 0909 07/07/19 1725 07/08/19 0115  HGB 8.7*  --   --  8.4*  --   --   --   --   HCT 27.8*  --   --  26.9*  --   --   --   --   PLT 209  --   --  193  --   --   --   --   APTT 39*  --   --   --   --   --   --   --   LABPROT 13.8  --   --   --   --   --   --   --   INR 1.1  --   --   --   --   --   --   --   HEPARINUNFRC  --   --    < >  --   --  0.30 0.19* 0.35  CREATININE 8.95*  --   --  4.99*  --   --   --   --   TROPONINIHS 16 17  --  16 20*  --   --   --    < > = values in this interval not displayed.   Estimated Creatinine Clearance: 12.8 mL/min (A) (by C-G formula based on SCr of 4.99 mg/dL (H)).  Assessment: 68 yo female here with chest pain starting on heparin drip. No oral anticoagulants noted on PTA med list.   Goal of Therapy:  Heparin level 0.3-0.7 units/ml Monitor platelets by anticoagulation protocol: Yes   Plan:  Started Heparin bolus 4000 units IV x1 then heparin drip at 1000 units/hr (=10 ml/hr)  10/17 @ 1827 HL = 0.65 therapeutic x 1 10/18 @ 0147 HL = 0.27, subtherapeutic slightly below goal, significant decrease from previous level, nurse confirmed that heparin infusion continued to run during dialysis. 10/18 0909 HL 0.3 therapeutic @ 1100 units/hr  Plan: 10/18 1725 HL 0.19 - subtherapeutic 10/19 0115 HL 0.35 - therapeutic x 1  Continue  Heparin infusion at 1300 units/hr and recheck in 6 hours per protocol  Pharmacy will continue to follow.   Ena Dawley, PharmD Clinical Pharmacist 07/08/2019 1:40 AM

## 2019-07-08 NOTE — Discharge Summary (Signed)
[place] [facility] [place] NAME: Kimberly Shaffer    MR#:  [id number]  DATE OF BIRTH:  08-19-1951  DATE OF ADMISSION:  07/06/2019 ADMITTING PHYSICIAN: Vaughan Basta, MD  DATE OF DISCHARGE: 07/08/2019   PRIMARY CARE PHYSICIAN: [facility]    ADMISSION DIAGNOSIS:  Precordial pain [R07.2] Abnormal EKG [R94.31]  DISCHARGE DIAGNOSIS:  Principal Problem:   Ischemic chest pain (HCC) Active Problems:   Chest pain   SECONDARY DIAGNOSIS:   Past Medical History:  Diagnosis Date   Asthma    CAD (coronary artery disease)    Chronic lower back pain    COPD (chronic obstructive pulmonary disease) (HCC)    Depression    Diabetes mellitus without complication ([place])    NIDD   H/O angina pectoris    H/O blood clots    Hypercholesteremia    Hypertension    Left rotator cuff tear    Myocardial infarction ([place])    Obesity    Renal insufficiency    Stroke (HCC)    Vertigo, aural     HOSPITAL COURSE:   *Chest pain Troponin is negative,started on heparin IV drip because of typical pain. Cardiology consult is called in. Monitor on telemetry. Further management as per cardiologist. -As patient again had chest pain last night cardiologist was called in for concerns of STEMI but that was denied to be STEMI by cardiologist. -Patient is not on any anticoagulation , received her CT scan to rule out pulmonary embolism.  It was negative. Patient stated without any chest pain.  Cardiologist cleared for discharge without further follow-ups.  Advised to follow with [facility].  *History of coronary artery disease Continue cardiac meds at home dose.  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 She does not look to have acute infection there. She had a  consult by vascular team.  Suggested to pack with wet and dry dressing after cleaning.  Also suggested to get podiatry consult because of gangrenous fifth toe. Podiatry saw the patient and they suggested no further work-up as there was good wound healing. Overall vascular and podiatry both advised to continue to follow with [facility] vascular doctors as she was already supposed to after her surgery.  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 No further work-up as per podiatry.  *End-stage renal disease on hemodialysis Nephrologist is consulted to continue dialysis here.  *Hypertension Continue home medications.  *COPD Currently no acute exacerbation. Continue home inhalers.   DISCHARGE CONDITIONS:   Stable  CONSULTS OBTAINED:  Treatment Team:  Teodoro Spray, MD Sharlotte Alamo, DPM  DRUG ALLERGIES:   Allergies  Allergen Reactions   Nystatin Hives and Itching   Sulfa Antibiotics Swelling, Hives and Rash   Niaspan [Niacin] Other (See Comments) and Hives    DISCHARGE MEDICATIONS:   Allergies as of 07/08/2019      Reactions   Nystatin Hives, Itching   Sulfa Antibiotics Swelling, Hives, Rash   Niaspan [niacin] Other (See Comments), Hives      Medication List    TAKE these medications   acetaminophen 325 MG tablet Commonly known as: TYLENOL Take 2 tablets (650 mg total) by mouth every 6 (six) hours as needed for mild pain (or Fever >/= 101).   albuterol 108 (90 Base) MCG/ACT inhaler Commonly known as: VENTOLIN HFA  Inhale 1 puff into the lungs every 6 (six) hours as needed for wheezing or shortness of breath.   albuterol (2.5 MG/3ML) 0.083% nebulizer solution Commonly known as: PROVENTIL Take 3 mLs (2.5 mg total) by nebulization every 4 (four) hours as needed for wheezing or shortness of breath.   amLODipine 10 MG tablet Commonly known as: NORVASC Take 10 mg by mouth daily.   aspirin 81 MG chewable tablet Chew 81 mg by mouth daily.     atorvastatin 40 MG tablet Commonly known as: LIPITOR Take 40 mg by mouth at bedtime.   budesonide-formoterol 160-4.5 MCG/ACT inhaler Commonly known as: SYMBICORT Inhale 2 puffs into the lungs 2 (two) times daily.   calcium carbonate 500 MG chewable tablet Commonly known as: TUMS - dosed in mg elemental calcium Chew 2 tablets by mouth 3 (three) times daily.   cetirizine 10 MG tablet Commonly known as: ZYRTEC Take 10 mg by mouth daily.   cinacalcet 30 MG tablet Commonly known as: Sensipar Take 1 tablet (30 mg total) by mouth daily before breakfast.   clopidogrel 75 MG tablet Commonly known as: PLAVIX Take 1 tablet (75 mg total) by mouth daily.   docusate sodium 100 MG capsule Commonly known as: COLACE Take 1 capsule (100 mg total) by mouth 2 (two) times daily.   fluticasone 50 MCG/ACT nasal spray Commonly known as: FLONASE Place 2 sprays into both nostrils 2 (two) times daily.   fluticasone furoate-vilanterol 200-25 MCG/INH Aepb Commonly known as: BREO ELLIPTA Inhale 1 puff into the lungs daily.   folic acid 1 MG tablet Commonly known as: FOLVITE Take 1 mg by mouth daily.   isosorbide mononitrate 30 MG 24 hr tablet Commonly known as: IMDUR Take 30 mg by mouth daily.   lidocaine-prilocaine cream Commonly known as: EMLA Apply 1 application topically as needed (topical anesthesia for hemodialysis if Gebauers and Lidocaine injection are ineffective.).   lubiprostone 24 MCG capsule Commonly known as: AMITIZA Take 24 mcg by mouth 2 (two) times daily with a meal.   meclizine 25 MG tablet Commonly known as: ANTIVERT Take 25 mg by mouth 3 (three) times daily as needed for dizziness.   metoprolol succinate 100 MG 24 hr tablet Commonly known as: TOPROL-XL Take 1 tablet (100 mg total) by mouth daily with breakfast. Take with or immediately following a meal.   multivitamin Tabs tablet Take 1 tablet by mouth at bedtime.   ondansetron 4 MG tablet Commonly known as:  ZOFRAN Take 1 tablet (4 mg total) by mouth every 6 (six) hours as needed for nausea.   Oxycodone HCl 10 MG Tabs Take 10 mg by mouth every 6 (six) hours as needed (pain).   pantoprazole 40 MG tablet Commonly known as: PROTONIX Take 40 mg by mouth 2 (two) times daily.   polyethylene glycol 17 g packet Commonly known as: MIRALAX / GLYCOLAX Take 17 g by mouth daily.   ranolazine 500 MG 12 hr tablet Commonly known as: RANEXA Take 500 mg by mouth 2 (two) times daily.   sevelamer carbonate 800 MG tablet Commonly known as: RENVELA Take 2,400 mg by mouth 3 (three) times daily with meals.   tiotropium 18 MCG inhalation capsule Commonly known as: Spiriva HandiHaler Place 1 capsule (18 mcg total) into inhaler and inhale daily.   torsemide 100 MG tablet Commonly known as: DEMADEX Take 100 mg by mouth daily.   venlafaxine XR 37.5 MG 24 hr capsule Commonly known as: EFFEXOR-XR Take 37.5 mg by mouth daily with breakfast.  If you experience worsening of your admission symptoms, develop shortness of breath, life threatening emergency, suicidal or homicidal thoughts you must seek medical attention immediately by calling 911 or calling your MD immediately  if symptoms less severe.  You Must read complete instructions/literature along with all the possible adverse reactions/side effects for all the Medicines you take and that have been prescribed to you. Take any new Medicines after you have completely understood and accept all the possible adverse reactions/side effects.   Please note  You were cared for by a hospitalist during your hospital stay. If you have any questions about your discharge medications or the care you received while you were in the hospital after you are discharged, you can call the unit and asked to speak with the hospitalist on call if the hospitalist that took care of you is not available. Once you are discharged, your primary care physician will handle any  further medical issues. Please note that NO REFILLS for any discharge medications will be authorized once you are discharged, as it is imperative that you return to your primary care physician (or establish a relationship with a primary care physician if you do not have one) for your aftercare needs so that they can reassess your need for medications and monitor your lab values.    Today   CHIEF COMPLAINT:   Chief Complaint  Patient presents with   Chest Pain    HISTORY OF PRESENT ILLNESS:  Kimberly Shaffer  is a 68 y.o. female with a known history of CAD< COPD, ESRD on HD, hypertension, obesity, peripheral arterial disease-recent vascular bypass surgery on right lower extremity at [facility]. She had some tightness in her left side of chest last night around 2 when she was sleeping.  She woke up with the pain.  She denies any associated palpitation or shortness of breath with that and the pain lasted for 20 to 30 minutes and she went back to sleep.  In the morning around 5:30 she had her dialysis chart all came to pick her up at that time she again felt some pain, without any palpitation or shortness of breath.  She went to the dialysis center and talk to the nurse about this.  They gave her some aspirin tablets which helped to relieve the pain and they decided to send her to emergency room because of this complaint. When I saw in the morning patient did not had any chest pain.  Her troponin was negative but she had some new EKG changes as per ER physician and he had started on heparin IV drip.    VITAL SIGNS:  Blood pressure 123/60, pulse 68, temperature 98.4 F (36.9 C), temperature source Oral, resp. rate 19, height 5\' 6"  (1.676 m), weight 99.7 kg, SpO2 99 %.  I/O:    Intake/Output Summary (Last 24 hours) at 07/08/2019 1244 Last data filed at 07/08/2019 0900 Gross per 24 hour  Intake 600 ml  Output 0 ml  Net 600 ml    PHYSICAL EXAMINATION:   GENERAL:68 y.o.-year-old patient lying  in the bed with no acute distress.  EYES: Pupils equal, round, reactive to light and accommodation. No scleral icterus. Extraocular muscles intact.  HEENT: Head atraumatic, normocephalic. Oropharynx and nasopharynx clear.  NECK: Supple, no jugular venous distention. No thyroid enlargement, no tenderness.  LUNGS: Normal breath sounds bilaterally, no wheezing, rales,rhonchi or crepitation. No use of accessory muscles of respiration.  CARDIOVASCULAR: S1, S2 normal. No murmurs, rubs, or gallops.  ABDOMEN: Soft, nontender,  nondistended. Bowel sounds present. No organomegaly or mass.  EXTREMITIES: No pedal edema, cyanosis, or clubbing.There is a recent surgery on right lower extremity in the wound extending from mid of calf until her inguinal region on the right,upper end of the wound in her thigh has some open staples with yellowish looking pus in the opening.She is not tender, red, warm around the wound. NEUROLOGIC: Cranial nerves II through XII are intact. Muscle strength 5/5 in all extremities. Sensation intact. Gait not checked.  PSYCHIATRIC: The patient is alert and oriented x 3.  SKIN: No obvious rash, lesion, or ulcer.  DATA REVIEW:   CBC Recent Labs  Lab 07/07/19 0440  WBC 6.7  HGB 8.4*  HCT 26.9*  PLT 193    Chemistries  Recent Labs  Lab 07/06/19 0756 07/07/19 0440  NA 137 137  K 4.3 3.2*  CL 93* 97*  CO2 27 28  GLUCOSE 123* 148*  BUN 53* 21  CREATININE 8.95* 4.99*  CALCIUM 7.9* 8.3*  AST 13*  --   ALT 7  --   ALKPHOS 102  --   BILITOT 0.7  --     Cardiac Enzymes No results for input(s): TROPONINI in the last 168 hours.  Microbiology Results  Results for orders placed or performed during the hospital encounter of 07/06/19  SARS CORONAVIRUS 2 (TAT 6-24 HRS) Nasopharyngeal Nasopharyngeal Swab     Status: None   Collection Time: 07/06/19 10:46 AM   Specimen: Nasopharyngeal Swab  Result Value Ref Range Status   SARS Coronavirus 2 NEGATIVE NEGATIVE Final     Comment: (NOTE) SARS-CoV-2 target nucleic acids are NOT DETECTED. The SARS-CoV-2 RNA is generally detectable in upper and lower respiratory specimens during the acute phase of infection. Negative results do not preclude SARS-CoV-2 infection, do not rule out co-infections with other pathogens, and should not be used as the sole basis for treatment or other patient management decisions. Negative results must be combined with clinical observations, patient history, and epidemiological information. The expected result is Negative. Fact Sheet for Patients: SugarRoll.be Fact Sheet for Healthcare Providers: https://www.woods-mathews.com/ This test is not yet approved or cleared by the [place] FDA and  has been authorized for detection and/or diagnosis of SARS-CoV-2 by FDA under an Emergency Use Authorization (EUA). This EUA will remain  in effect (meaning this test can be used) for the duration of the COVID-19 declaration under Section 56 4(b)(1) of the Act, 21 U.S.C. section 360bbb-3(b)(1), unless the authorization is terminated or revoked sooner. Performed at [facility], [place] [address], [place], [place] [address]   MRSA PCR Screening     Status: None   Collection Time: 07/06/19  5:31 PM   Specimen: Nasopharyngeal  Result Value Ref Range Status   MRSA by PCR NEGATIVE NEGATIVE Final    Comment:        The GeneXpert MRSA Assay (FDA approved for NASAL specimens only), is one component of a comprehensive MRSA colonization surveillance program. It is not intended to diagnose MRSA infection nor to guide or monitor treatment for MRSA infections. Performed at [facility], [place], [place] [address]     RADIOLOGY:  Ct Angio Chest Pe W Or Wo Contrast  Result Date: 07/07/2019 CLINICAL DATA:  68 year old female with high probability of pulmonary embolism. Chest pain with increasing shortness of breath on  exertion. EXAM: CT ANGIOGRAPHY CHEST WITH CONTRAST TECHNIQUE: Multidetector CT imaging of the chest was performed using the standard protocol during bolus  administration of intravenous contrast. Multiplanar CT image reconstructions and MIPs were obtained to evaluate the vascular anatomy. CONTRAST:  39mL OMNIPAQUE IOHEXOL 350 MG/ML SOLN COMPARISON:  Chest CTA 12/17/2015. FINDINGS: Cardiovascular: No filling defects within the pulmonary arterial tree to suggest underlying pulmonary embolism. Heart size is enlarged with left atrial and left ventricular dilatation. There is no significant pericardial fluid, thickening or pericardial calcification. There is aortic atherosclerosis, as well as atherosclerosis of the great vessels of the mediastinum and the coronary arteries, including calcified atherosclerotic plaque in the left main, left anterior descending, left circumflex and right coronary arteries. Thickening calcification of the aortic valve. Dilatation of the pulmonic trunk (3.6 cm in diameter). Mediastinum/Nodes: No pathologically enlarged mediastinal or hilar lymph nodes. Esophagus is unremarkable in appearance. No axillary lymphadenopathy. Lungs/Pleura: Small pulmonary nodules in the right upper lobe measuring up to 5 mm (axial image 40 of series 6). No other larger more suspicious appearing pulmonary nodules or masses are noted. No acute consolidative airspace disease. No pleural effusions. Upper Abdomen: 1.3 cm exophytic low-attenuation lesion in the anterior aspect of the upper pole the right kidney, compatible with a simple cyst. Aortic atherosclerosis. Musculoskeletal: Status post resection of the anterior aspect of the left fifth rib. There are no aggressive appearing lytic or blastic lesions noted in the visualized portions of the skeleton. Review of the MIP images confirms the above findings. IMPRESSION: 1. No evidence of pulmonary embolism. 2. No acute findings are noted in the thorax to account for the  patient's symptoms. 3. Aortic atherosclerosis, in addition to left main and 3 vessel coronary artery disease. Please note that although the presence of coronary artery calcium documents the presence of coronary artery disease, the severity of this disease and any potential stenosis cannot be assessed on this non-gated CT examination. Assessment for potential risk factor modification, dietary therapy or pharmacologic therapy may be warranted, if clinically indicated. 4. Dilatation of the pulmonic trunk (3.6 cm in diameter), concerning for pulmonary arterial hypertension. 5. Cardiomegaly with left atrial and left ventricular dilatation. Electronically Signed   By: Vinnie Langton M.D.   On: 07/07/2019 12:36    EKG:   Orders placed or performed during the hospital encounter of 07/06/19   ED EKG   ED EKG   EKG 12-Lead   EKG 12-Lead   EKG - 12 lead   EKG - 12 lead   EKG 12-Lead   EKG 12-Lead   EKG 12-Lead   EKG 12-Lead      Management plans discussed with the patient, family and they are in agreement.  CODE STATUS:     Code Status Orders  (From admission, onward)         Start     Ordered   07/06/19 1352  Do not attempt resuscitation (DNR)  Continuous    Question Answer Comment  In the event of cardiac or respiratory ARREST Do not call a code blue   In the event of cardiac or respiratory ARREST Do not perform Intubation, CPR, defibrillation or ACLS   In the event of cardiac or respiratory ARREST Use medication by any route, position, wound care, and other measures to relive pain and suffering. May use oxygen, suction and manual treatment of airway obstruction as needed for comfort.   Comments nurse may pronounce      07/06/19 1351        Code Status History    Date Active Date Inactive Code Status Order ID Comments User Context   06/19/2018  [id number] 06/21/2018 2113 DNR [id number]  Arta Silence, MD ED   05/29/2018 0618 05/30/2018 2328 DNR [id number]  Harrie Foreman, MD ED   04/18/2018 0250 04/19/2018 1925 DNR [id number]  Harrie Foreman, MD ED   03/23/2018 0955 03/23/2018 2048 DNR [id number]  Loletha Grayer, MD Inpatient   03/23/2018 0500 03/23/2018 0955 Full Code [id number]  Arta Silence, MD Inpatient   03/20/2018 0605 03/21/2018 1313 Full Code [id number]  Harrie Foreman, MD Inpatient   03/02/2018 0313 03/03/2018 1952 Full Code [id number]  Lance Coon, MD ED   02/26/2018 0716 02/27/2018 1821 Full Code [id number]  Arta Silence, MD Inpatient   01/11/2018 2030 01/14/2018 1509 Full Code [id number]  Saundra Shelling, MD Inpatient   12/25/2017 0835 12/25/2017 1743 Full Code [id number]  Teodoro Spray, MD Inpatient   12/23/2017 0927 12/25/2017 0834 Full Code [id number]  Harrie Foreman, MD Inpatient   12/08/2017 0357 12/08/2017 2344 Full Code [id number]  Harrie Foreman, MD Inpatient   06/28/2017 0749 07/01/2017 2218 Full Code [id number]  Harrie Foreman, MD Inpatient   07/19/2016 0857 07/19/2016 1319 Full Code [id number]  Corey Skains, MD Inpatient   07/05/2016 2236 07/09/2016 1950 Full Code [id number]  Harvie Bridge, DO Inpatient   12/17/2015 [id number] 12/20/2015 1957 Full Code [id number]  Saundra Shelling, MD Inpatient   Advance Care Planning Activity      TOTAL TIME TAKING CARE OF THIS PATIENT:35 minutes.    Vaughan Basta M.D on 07/08/2019 at 12:44 PM  Between 7am to 6pm - Pager - [phone]  After 6pm go to [email or web address] - password [email or web address] [place] [facility]  Office  [phone]  CC: Primary care physician; [facility]   Note: This dictation was prepared with Dragon dictation along with smaller phrase technology. Any transcriptional errors that result from this process are unintentional.

## 2019-07-08 NOTE — Progress Notes (Signed)
ANTICOAGULATION CONSULT NOTE -  Pharmacy Consult for Heparin  Indication: chest pain/ACS  Allergies  Allergen Reactions  . Nystatin Hives and Itching  . Sulfa Antibiotics Swelling, Hives and Rash  . Niaspan [Niacin] Other (See Comments) and Hives    Patient Measurements: Height: 5\' 6"  (167.6 cm) Weight: 219 lb 14.4 oz (99.7 kg) IBW/kg (Calculated) : 59.3 Heparin Dosing Weight: 82 kg   Vital Signs: Temp: 98.4 F (36.9 C) (10/19 0734) Temp Source: Oral (10/19 0424) BP: 123/60 (10/19 0734) Pulse Rate: 68 (10/19 0734)  Labs: Recent Labs    07/06/19 0756 07/06/19 1039  07/07/19 0440 07/07/19 0620  07/07/19 1725 07/08/19 0115 07/08/19 0717  HGB 8.7*  --   --  8.4*  --   --   --   --   --   HCT 27.8*  --   --  26.9*  --   --   --   --   --   PLT 209  --   --  193  --   --   --   --   --   APTT 39*  --   --   --   --   --   --   --   --   LABPROT 13.8  --   --   --   --   --   --   --   --   INR 1.1  --   --   --   --   --   --   --   --   HEPARINUNFRC  --   --    < >  --   --    < > 0.19* 0.35 0.18*  CREATININE 8.95*  --   --  4.99*  --   --   --   --   --   TROPONINIHS 16 17  --  16 20*  --   --   --   --    < > = values in this interval not displayed.   Estimated Creatinine Clearance: 12.9 mL/min (A) (by C-G formula based on SCr of 4.99 mg/dL (H)).  Assessment: 68 yo female here with chest pain starting on heparin drip. No oral anticoagulants noted on PTA med list.   Goal of Therapy:  Heparin level 0.3-0.7 units/ml Monitor platelets by anticoagulation protocol: Yes   Plan:  Started Heparin bolus 4000 units IV x1 then heparin drip at 1000 units/hr (=10 ml/hr)  10/17 @ 1827 HL = 0.65 therapeutic x 1 10/18 @ 0147 HL = 0.27, subtherapeutic slightly below goal, significant decrease from previous level, nurse confirmed that heparin infusion continued to run during dialysis. 10/18 0909 HL 0.3 therapeutic @ 1100 units/hr 10/18 1725 HL 0.19  10/19 0115 HL 0.35  therapeutic  10/19 0717 HL 0.18   Plan: Heparin level subtherapeutic. Spoke to RN and not stop in therapy. Will bolus 1200 units and increase the rate to 1400 units.  Recheck in 6 hours per protocol. CBC daily.   Pharmacy will continue to follow.   Oswald Hillock, PharmD, BCPS Clinical Pharmacist 07/08/2019 7:54 AM

## 2019-07-08 NOTE — Progress Notes (Signed)
Established hemodialysis patient known at [facility] TTS 6:30, Patient normally transports with [facility]. Please contact me directly with any dialysis placement concerns.  Elvera Bicker Dialysis Coordinator [phone]

## 2019-07-08 NOTE — Progress Notes (Signed)
Kimberly Shaffer to be D/C'd Home per MD order.  Discussed prescriptions and follow up appointments with the patient. medication list explained in detail. Pt verbalized understanding.  Allergies as of 07/08/2019      Reactions   Nystatin Hives, Itching   Sulfa Antibiotics Swelling, Hives, Rash   Niaspan [niacin] Other (See Comments), Hives      Medication List    TAKE these medications   acetaminophen 325 MG tablet Commonly known as: TYLENOL Take 2 tablets (650 mg total) by mouth every 6 (six) hours as needed for mild pain (or Fever >/= 101).   albuterol 108 (90 Base) MCG/ACT inhaler Commonly known as: VENTOLIN HFA Inhale 1 puff into the lungs every 6 (six) hours as needed for wheezing or shortness of breath.   albuterol (2.5 MG/3ML) 0.083% nebulizer solution Commonly known as: PROVENTIL Take 3 mLs (2.5 mg total) by nebulization every 4 (four) hours as needed for wheezing or shortness of breath.   amLODipine 10 MG tablet Commonly known as: NORVASC Take 10 mg by mouth daily.   aspirin 81 MG chewable tablet Chew 81 mg by mouth daily.   atorvastatin 40 MG tablet Commonly known as: LIPITOR Take 40 mg by mouth at bedtime.   budesonide-formoterol 160-4.5 MCG/ACT inhaler Commonly known as: SYMBICORT Inhale 2 puffs into the lungs 2 (two) times daily.   calcium carbonate 500 MG chewable tablet Commonly known as: TUMS - dosed in mg elemental calcium Chew 2 tablets by mouth 3 (three) times daily.   cetirizine 10 MG tablet Commonly known as: ZYRTEC Take 10 mg by mouth daily.   cinacalcet 30 MG tablet Commonly known as: Sensipar Take 1 tablet (30 mg total) by mouth daily before breakfast.   clopidogrel 75 MG tablet Commonly known as: PLAVIX Take 1 tablet (75 mg total) by mouth daily.   docusate sodium 100 MG capsule Commonly known as: COLACE Take 1 capsule (100 mg total) by mouth 2 (two) times daily.   fluticasone 50 MCG/ACT nasal spray Commonly known as: FLONASE Place  2 sprays into both nostrils 2 (two) times daily.   fluticasone furoate-vilanterol 200-25 MCG/INH Aepb Commonly known as: BREO ELLIPTA Inhale 1 puff into the lungs daily.   folic acid 1 MG tablet Commonly known as: FOLVITE Take 1 mg by mouth daily.   isosorbide mononitrate 30 MG 24 hr tablet Commonly known as: IMDUR Take 30 mg by mouth daily.   lidocaine-prilocaine cream Commonly known as: EMLA Apply 1 application topically as needed (topical anesthesia for hemodialysis if Gebauers and Lidocaine injection are ineffective.).   lubiprostone 24 MCG capsule Commonly known as: AMITIZA Take 24 mcg by mouth 2 (two) times daily with a meal.   meclizine 25 MG tablet Commonly known as: ANTIVERT Take 25 mg by mouth 3 (three) times daily as needed for dizziness.   metoprolol succinate 100 MG 24 hr tablet Commonly known as: TOPROL-XL Take 1 tablet (100 mg total) by mouth daily with breakfast. Take with or immediately following a meal.   multivitamin Tabs tablet Take 1 tablet by mouth at bedtime.   ondansetron 4 MG tablet Commonly known as: ZOFRAN Take 1 tablet (4 mg total) by mouth every 6 (six) hours as needed for nausea.   Oxycodone HCl 10 MG Tabs Take 10 mg by mouth every 6 (six) hours as needed (pain).   pantoprazole 40 MG tablet Commonly known as: PROTONIX Take 40 mg by mouth 2 (two) times daily.   polyethylene glycol 17 g packet Commonly  known as: MIRALAX / GLYCOLAX Take 17 g by mouth daily.   ranolazine 500 MG 12 hr tablet Commonly known as: RANEXA Take 500 mg by mouth 2 (two) times daily.   sevelamer carbonate 800 MG tablet Commonly known as: RENVELA Take 2,400 mg by mouth 3 (three) times daily with meals.   tiotropium 18 MCG inhalation capsule Commonly known as: Spiriva HandiHaler Place 1 capsule (18 mcg total) into inhaler and inhale daily.   torsemide 100 MG tablet Commonly known as: DEMADEX Take 100 mg by mouth daily.   venlafaxine XR 37.5 MG 24 hr  capsule Commonly known as: EFFEXOR-XR Take 37.5 mg by mouth daily with breakfast.       Vitals:   07/08/19 0424 07/08/19 0734  BP: 124/62 123/60  Pulse: 70 68  Resp: 20 19  Temp: 98.6 F (37 C) 98.4 F (36.9 C)  SpO2: 94% 99%    Tele box removed and returned. Skin clean, dry and intact without evidence of skin break down, no evidence of skin tears noted. IV catheter discontinued intact. Site without signs and symptoms of complications. Dressing and pressure applied. Pt denies pain at this time. No complaints noted.  An After Visit Summary was printed and given to the patient. Patient escorted via [place], and D/C home via private auto.  Rolley Sims

## 2019-07-08 NOTE — Progress Notes (Signed)
[facility]  ROUNDING NOTE   Subjective:   CTA yesterday for shortness of breath. Negative for PE Currently on oxygen.   Objective:  Vital signs in last 24 hours:  Temp:  [98.4 F (36.9 C)-99 F (37.2 C)] 98.4 F (36.9 C) (10/19 0734) Pulse Rate:  [64-74] 68 (10/19 0734) Resp:  [18-20] 19 (10/19 0734) BP: (113-124)/(57-67) 123/60 (10/19 0734) SpO2:  [94 %-100 %] 99 % (10/19 0734) Weight:  [99.7 kg] 99.7 kg (10/19 0147)  Weight change: -0.499 kg Filed Weights   07/06/19 2138 07/07/19 0416 07/08/19 0147  Weight: 98.7 kg 98.6 kg 99.7 kg    Intake/Output: I/O last 3 completed shifts: In: 276.2 [P.O.:240; I.V.:36.2] Out: 1281 [Other:1281]   Intake/Output this shift:  Total I/O In: 600 [P.O.:600] Out: -   Physical Exam: General: No acute distress  Head: Normocephalic, atraumatic. Moist oral mucosal membranes  Eyes: Anicteric  Neck: Supple, trachea midline  Lungs:  Clear to auscultation, 2L [place] O2  Heart: regular  Abdomen:  Soft, nontender, bowel sounds present  Extremities: Trace peripheral edema  Neurologic: Awake, alert, following commands  Skin: No lesions  Access: LUE AVF    Basic Metabolic Panel: Recent Labs  Lab 07/06/19 0756 07/06/19 1455 07/07/19 0440  NA 137  --  137  K 4.3  --  3.2*  CL 93*  --  97*  CO2 27  --  28  GLUCOSE 123*  --  148*  BUN 53*  --  21  CREATININE 8.95*  --  4.99*  CALCIUM 7.9*  --  8.3*  PHOS  --  4.4  --     Liver Function Tests: Recent Labs  Lab 07/06/19 0756  AST 13*  ALT 7  ALKPHOS 102  BILITOT 0.7  PROT 7.6  ALBUMIN 3.3*   No results for input(s): LIPASE, AMYLASE in the last 168 hours. No results for input(s): AMMONIA in the last 168 hours.  CBC: Recent Labs  Lab 07/06/19 0756 07/07/19 0440  WBC 6.5 6.7  NEUTROABS 4.0  --   HGB 8.7* 8.4*  HCT 27.8* 26.9*  MCV 102.2* 101.5*  PLT 209 193    Cardiac Enzymes: No results for input(s): CKTOTAL, CKMB, CKMBINDEX, TROPONINI in the last 168  hours.  BNP: Invalid input(s): POCBNP  CBG: No results for input(s): GLUCAP in the last 168 hours.  Microbiology: Results for orders placed or performed during the hospital encounter of 07/06/19  SARS CORONAVIRUS 2 (TAT 6-24 HRS) Nasopharyngeal Nasopharyngeal Swab     Status: None   Collection Time: 07/06/19 10:46 AM   Specimen: Nasopharyngeal Swab  Result Value Ref Range Status   SARS Coronavirus 2 NEGATIVE NEGATIVE Final    Comment: (NOTE) SARS-CoV-2 target nucleic acids are NOT DETECTED. The SARS-CoV-2 RNA is generally detectable in upper and lower respiratory specimens during the acute phase of infection. Negative results do not preclude SARS-CoV-2 infection, do not rule out co-infections with other pathogens, and should not be used as the sole basis for treatment or other patient management decisions. Negative results must be combined with clinical observations, patient history, and epidemiological information. The expected result is Negative. Fact Sheet for Patients: SugarRoll.be Fact Sheet for Healthcare Providers: https://www.woods-mathews.com/ This test is not yet approved or cleared by the [place] FDA and  has been authorized for detection and/or diagnosis of SARS-CoV-2 by FDA under an Emergency Use Authorization (EUA). This EUA will remain  in effect (meaning this test can be used) for the duration of  the COVID-19 declaration under Section 56 4(b)(1) of the Act, 21 U.S.C. section 360bbb-3(b)(1), unless the authorization is terminated or revoked sooner. Performed at [facility], [place] [address], [place], [place] [address]   MRSA PCR Screening     Status: None   Collection Time: 07/06/19  5:31 PM   Specimen: Nasopharyngeal  Result Value Ref Range Status   MRSA by PCR NEGATIVE NEGATIVE Final    Comment:        The GeneXpert MRSA Assay (FDA approved for NASAL specimens only), is one component of  a comprehensive MRSA colonization surveillance program. It is not intended to diagnose MRSA infection nor to guide or monitor treatment for MRSA infections. Performed at [facility], [address] [place], [place] [address]     Coagulation Studies: Recent Labs    07/06/19 0756  LABPROT 13.8  INR 1.1    Urinalysis: No results for input(s): COLORURINE, LABSPEC, PHURINE, GLUCOSEU, HGBUR, BILIRUBINUR, KETONESUR, PROTEINUR, UROBILINOGEN, NITRITE, LEUKOCYTESUR in the last 72 hours.  Invalid input(s): APPERANCEUR    Imaging: Ct Angio Chest Pe W Or Wo Contrast  Result Date: 07/07/2019 CLINICAL DATA:  68 year old female with high probability of pulmonary embolism. Chest pain with increasing shortness of breath on exertion. EXAM: CT ANGIOGRAPHY CHEST WITH CONTRAST TECHNIQUE: Multidetector CT imaging of the chest was performed using the standard protocol during bolus administration of intravenous contrast. Multiplanar CT image reconstructions and MIPs were obtained to evaluate the vascular anatomy. CONTRAST:  75mL OMNIPAQUE IOHEXOL 350 MG/ML SOLN COMPARISON:  Chest CTA 12/17/2015. FINDINGS: Cardiovascular: No filling defects within the pulmonary arterial tree to suggest underlying pulmonary embolism. Heart size is enlarged with left atrial and left ventricular dilatation. There is no significant pericardial fluid, thickening or pericardial calcification. There is aortic atherosclerosis, as well as atherosclerosis of the great vessels of the mediastinum and the coronary arteries, including calcified atherosclerotic plaque in the left main, left anterior descending, left circumflex and right coronary arteries. Thickening calcification of the aortic valve. Dilatation of the pulmonic trunk (3.6 cm in diameter). Mediastinum/Nodes: No pathologically enlarged mediastinal or hilar lymph nodes. Esophagus is unremarkable in appearance. No axillary lymphadenopathy. Lungs/Pleura: Small pulmonary  nodules in the right upper lobe measuring up to 5 mm (axial image 40 of series 6). No other larger more suspicious appearing pulmonary nodules or masses are noted. No acute consolidative airspace disease. No pleural effusions. Upper Abdomen: 1.3 cm exophytic low-attenuation lesion in the anterior aspect of the upper pole the right kidney, compatible with a simple cyst. Aortic atherosclerosis. Musculoskeletal: Status post resection of the anterior aspect of the left fifth rib. There are no aggressive appearing lytic or blastic lesions noted in the visualized portions of the skeleton. Review of the MIP images confirms the above findings. IMPRESSION: 1. No evidence of pulmonary embolism. 2. No acute findings are noted in the thorax to account for the patient's symptoms. 3. Aortic atherosclerosis, in addition to left main and 3 vessel coronary artery disease. Please note that although the presence of coronary artery calcium documents the presence of coronary artery disease, the severity of this disease and any potential stenosis cannot be assessed on this non-gated CT examination. Assessment for potential risk factor modification, dietary therapy or pharmacologic therapy may be warranted, if clinically indicated. 4. Dilatation of the pulmonic trunk (3.6 cm in diameter), concerning for pulmonary arterial hypertension. 5. Cardiomegaly with left atrial and left ventricular dilatation. Electronically Signed   By: Vinnie Langton M.D.   On: 07/07/2019  12:36     Medications:   . sodium chloride Stopped (07/07/19 0516)  . heparin 1,400 Units/hr (07/08/19 0816)   . amLODipine  10 mg Oral Daily  . aspirin  81 mg Oral Daily  . atorvastatin  40 mg Oral QHS  . calcium carbonate  2 tablet Oral TID  . Chlorhexidine Gluconate Cloth  6 each Topical Q0600  . cinacalcet  30 mg Oral Q breakfast  . clopidogrel  75 mg Oral Daily  . docusate sodium  100 mg Oral BID  . feeding supplement (NEPRO CARB STEADY)  237 mL Oral BID  BM  . fluticasone  2 spray Each Nare BID  . folic acid  1 mg Oral Daily  . isosorbide mononitrate  30 mg Oral Daily  . loratadine  10 mg Oral Daily  . lubiprostone  24 mcg Oral BID WC  . metoprolol succinate  100 mg Oral Q breakfast  . mometasone-formoterol  2 puff Inhalation BID  . multivitamin  1 tablet Oral QHS  . nicotine  21 mg Transdermal Daily  . pantoprazole  40 mg Oral BID  . polyethylene glycol  17 g Oral Daily  . ranolazine  500 mg Oral BID  . sevelamer carbonate  2,400 mg Oral TID WC  . tiotropium  1 capsule Inhalation Daily  . torsemide  100 mg Oral Daily  . venlafaxine XR  37.5 mg Oral Q breakfast   acetaminophen, albuterol, albuterol, docusate sodium, labetalol, lidocaine-prilocaine, meclizine, morphine injection, ondansetron, oxyCODONE  Assessment/ Plan:  68 y.o.black female with end stage renal disease on hemodialysis, coronary artery disease status post CABG, hypertension, peripheral vascular disease, diabetes mellitus type 2, hyperlipidemia, bilateral total knee replacement  [facility] TTS LUE AVF 98kg  1.  ESRD on HD TTS. - Dialysis for tomorrow  2.  Anemia of chronic kidney disease.   - EPO with HD treatment  3.  Secondary hyperparathyroidism.  - cinacalcet - sevalamer and calcium carbonate.   4.  Hypertension.   - amlodipine and metoprolol.   LOS: 0 Arlie Riker 10/19/202011:37 AM

## 2019-07-08 NOTE — Progress Notes (Signed)
Patient Name: Kimberly Shaffer Date of Encounter: 07/08/2019  Hospital Problem List     Principal Problem:   Ischemic chest pain [facility]) Active Problems:   Chest pain    Patient Profile     68 with history of cad with extensive pci history and cabg x 1 , extensive pvd s/p right femoral to popliteal revascularization last week at [facility], history of esrd on hd.  She had chest pain on hemodialysis and was admitted.  She ruled out for myocardial infarction.  She had recurrent pain which was relieved by nitro and morphine.  She had no further bump in her cardiac markers.  She is doing well today with no chest pain.  Arm pain is improved.  Subjective   Doing well with no symptoms.  Anxious to go home  Inpatient Medications    . amLODipine  10 mg Oral Daily  . aspirin  81 mg Oral Daily  . atorvastatin  40 mg Oral QHS  . calcium carbonate  2 tablet Oral TID  . Chlorhexidine Gluconate Cloth  6 each Topical Q0600  . cinacalcet  30 mg Oral Q breakfast  . clopidogrel  75 mg Oral Daily  . docusate sodium  100 mg Oral BID  . feeding supplement (NEPRO CARB STEADY)  237 mL Oral BID BM  . fluticasone  2 spray Each Nare BID  . folic acid  1 mg Oral Daily  . isosorbide mononitrate  30 mg Oral Daily  . loratadine  10 mg Oral Daily  . lubiprostone  24 mcg Oral BID WC  . metoprolol succinate  100 mg Oral Q breakfast  . mometasone-formoterol  2 puff Inhalation BID  . multivitamin  1 tablet Oral QHS  . nicotine  21 mg Transdermal Daily  . pantoprazole  40 mg Oral BID  . polyethylene glycol  17 g Oral Daily  . potassium chloride  20 mEq Oral Once  . ranolazine  500 mg Oral BID  . sevelamer carbonate  2,400 mg Oral TID WC  . tiotropium  1 capsule Inhalation Daily  . torsemide  100 mg Oral Daily  . venlafaxine XR  37.5 mg Oral Q breakfast    Vital Signs    Vitals:   07/07/19 1938 07/08/19 0147 07/08/19 0424 07/08/19 0734  BP: 113/67  124/62 123/60  Pulse: 74  70 68  Resp: 20  20 19   Temp:  99 F (37.2 C)  98.6 F (37 C) 98.4 F (36.9 C)  TempSrc: Oral  Oral Oral  SpO2: 100%  94% 99%  Weight:  99.7 kg    Height:        Intake/Output Summary (Last 24 hours) at 07/08/2019 1052 Last data filed at 07/08/2019 0900 Gross per 24 hour  Intake 600 ml  Output -  Net 600 ml   Filed Weights   07/06/19 2138 07/07/19 0416 07/08/19 0147  Weight: 98.7 kg 98.6 kg 99.7 kg    Physical Exam    GEN: Well nourished, well developed, in no acute distress.  HEENT: normal.  Neck: Supple, no JVD, carotid bruits, or masses. Cardiac: RRR, no murmurs, rubs, or gallops. No clubbing, cyanosis, edema.  Radials/DP/PT 2+ and equal bilaterally.  Respiratory:  Respirations regular and unlabored, clear to auscultation bilaterally. GI: Soft, nontender, nondistended, BS + x 4. MS: no deformity or atrophy. Skin: warm and dry, no rash. Neuro:  Strength and sensation are intact. Psych: Normal affect.  Labs    CBC Recent Labs  07/06/19 0756 07/07/19 0440  WBC 6.5 6.7  NEUTROABS 4.0  --   HGB 8.7* 8.4*  HCT 27.8* 26.9*  MCV 102.2* 101.5*  PLT 209 0000000   Basic Metabolic Panel Recent Labs    07/06/19 0756 07/06/19 1455 07/07/19 0440  NA 137  --  137  K 4.3  --  3.2*  CL 93*  --  97*  CO2 27  --  28  GLUCOSE 123*  --  148*  BUN 53*  --  21  CREATININE 8.95*  --  4.99*  CALCIUM 7.9*  --  8.3*  PHOS  --  4.4  --    Liver Function Tests Recent Labs    07/06/19 0756  AST 13*  ALT 7  ALKPHOS 102  BILITOT 0.7  PROT 7.6  ALBUMIN 3.3*   No results for input(s): LIPASE, AMYLASE in the last 72 hours. Cardiac Enzymes No results for input(s): CKTOTAL, CKMB, CKMBINDEX, TROPONINI in the last 72 hours. BNP Recent Labs    07/06/19 0756  BNP 271.0*   D-Dimer No results for input(s): DDIMER in the last 72 hours. Hemoglobin A1C No results for input(s): HGBA1C in the last 72 hours. Fasting Lipid Panel Recent Labs    07/06/19 0756  CHOL 137  HDL 41  LDLCALC 73  TRIG 115   CHOLHDL 3.3   Thyroid Function Tests No results for input(s): TSH, T4TOTAL, T3FREE, THYROIDAB in the last 72 hours.  Invalid input(s): FREET3  Telemetry    Sinus rhythm  ECG    Sinus rhythm with LVH.  No ischemia.  Radiology    Ct Angio Chest Pe W Or Wo Contrast  Result Date: 07/07/2019 CLINICAL DATA:  68 year old female with high probability of pulmonary embolism. Chest pain with increasing shortness of breath on exertion. EXAM: CT ANGIOGRAPHY CHEST WITH CONTRAST TECHNIQUE: Multidetector CT imaging of the chest was performed using the standard protocol during bolus administration of intravenous contrast. Multiplanar CT image reconstructions and MIPs were obtained to evaluate the vascular anatomy. CONTRAST:  45mL OMNIPAQUE IOHEXOL 350 MG/ML SOLN COMPARISON:  Chest CTA 12/17/2015. FINDINGS: Cardiovascular: No filling defects within the pulmonary arterial tree to suggest underlying pulmonary embolism. Heart size is enlarged with left atrial and left ventricular dilatation. There is no significant pericardial fluid, thickening or pericardial calcification. There is aortic atherosclerosis, as well as atherosclerosis of the great vessels of the mediastinum and the coronary arteries, including calcified atherosclerotic plaque in the left main, left anterior descending, left circumflex and right coronary arteries. Thickening calcification of the aortic valve. Dilatation of the pulmonic trunk (3.6 cm in diameter). Mediastinum/Nodes: No pathologically enlarged mediastinal or hilar lymph nodes. Esophagus is unremarkable in appearance. No axillary lymphadenopathy. Lungs/Pleura: Small pulmonary nodules in the right upper lobe measuring up to 5 mm (axial image 40 of series 6). No other larger more suspicious appearing pulmonary nodules or masses are noted. No acute consolidative airspace disease. No pleural effusions. Upper Abdomen: 1.3 cm exophytic low-attenuation lesion in the anterior aspect of the upper  pole the right kidney, compatible with a simple cyst. Aortic atherosclerosis. Musculoskeletal: Status post resection of the anterior aspect of the left fifth rib. There are no aggressive appearing lytic or blastic lesions noted in the visualized portions of the skeleton. Review of the MIP images confirms the above findings. IMPRESSION: 1. No evidence of pulmonary embolism. 2. No acute findings are noted in the thorax to account for the patient's symptoms. 3. Aortic atherosclerosis, in addition to left main and  3 vessel coronary artery disease. Please note that although the presence of coronary artery calcium documents the presence of coronary artery disease, the severity of this disease and any potential stenosis cannot be assessed on this non-gated CT examination. Assessment for potential risk factor modification, dietary therapy or pharmacologic therapy may be warranted, if clinically indicated. 4. Dilatation of the pulmonic trunk (3.6 cm in diameter), concerning for pulmonary arterial hypertension. 5. Cardiomegaly with left atrial and left ventricular dilatation. Electronically Signed   By: Vinnie Langton M.D.   On: 07/07/2019 12:36   Dg Chest Portable 1 View  Result Date: 07/06/2019 CLINICAL DATA:  Chest pain EXAM: PORTABLE CHEST 1 VIEW COMPARISON:  11/27/2018 FINDINGS: Mild bibasilar scarring/atelectasis. No focal consolidation. No pleural effusion or pneumothorax. The heart is top-normal in size.  Thoracic aortic atherosclerosis. IMPRESSION: No evidence of acute cardiopulmonary disease. Electronically Signed   By: Julian Hy M.D.   On: 07/06/2019 08:26    Assessment & Plan    Coronary artery disease.  Ruled out for myocardial infarction.  Currently stable hemodynamically.  No further chest pain.  We will continue with current regimen.  Okay for discharge from cardiac standpoint.  Follow-up with her physicians and [facility] in [place].  End-stage renal disease-getting hemodialysis.  We will  continue thrice a week  Peripheral vascular disease status post femoropopliteal.  Follow-up with vascular surgery.  Signed, Javier Docker Deroy Noah MD 07/08/2019, 10:52 AM  Pager: [phone]

## 2019-07-09 LAB — PARATHYROID HORMONE, INTACT (NO CA): PTH: 472 pg/mL — ABNORMAL HIGH (ref 15–65)

## 2019-07-27 ENCOUNTER — Emergency Department
Admission: EM | Admit: 2019-07-27 | Discharge: 2019-07-27 | Disposition: A | Discharge status: Home / Self Care | Payer: Medicare Other | Attending: Emergency Medicine | Admitting: Emergency Medicine

## 2019-07-27 ENCOUNTER — Other Ambulatory Visit: Payer: Self-pay

## 2019-07-27 DIAGNOSIS — Z79899 Other long term (current) drug therapy: Secondary | ICD-10-CM | POA: Insufficient documentation

## 2019-07-27 DIAGNOSIS — N186 End stage renal disease: Secondary | ICD-10-CM | POA: Insufficient documentation

## 2019-07-27 DIAGNOSIS — E1140 Type 2 diabetes mellitus with diabetic neuropathy, unspecified: Secondary | ICD-10-CM | POA: Diagnosis not present

## 2019-07-27 DIAGNOSIS — F17210 Nicotine dependence, cigarettes, uncomplicated: Secondary | ICD-10-CM | POA: Insufficient documentation

## 2019-07-27 DIAGNOSIS — I5032 Chronic diastolic (congestive) heart failure: Secondary | ICD-10-CM | POA: Insufficient documentation

## 2019-07-27 DIAGNOSIS — R040 Epistaxis: Secondary | ICD-10-CM | POA: Insufficient documentation

## 2019-07-27 DIAGNOSIS — I132 Hypertensive heart and chronic kidney disease with heart failure and with stage 5 chronic kidney disease, or end stage renal disease: Secondary | ICD-10-CM | POA: Insufficient documentation

## 2019-07-27 DIAGNOSIS — E1122 Type 2 diabetes mellitus with diabetic chronic kidney disease: Secondary | ICD-10-CM | POA: Diagnosis not present

## 2019-07-27 DIAGNOSIS — Z7982 Long term (current) use of aspirin: Secondary | ICD-10-CM | POA: Insufficient documentation

## 2019-07-27 DIAGNOSIS — Z7902 Long term (current) use of antithrombotics/antiplatelets: Secondary | ICD-10-CM | POA: Diagnosis not present

## 2019-07-27 DIAGNOSIS — Z992 Dependence on renal dialysis: Secondary | ICD-10-CM | POA: Insufficient documentation

## 2019-07-27 MED ORDER — TRANEXAMIC ACID 1000 MG/10ML IV SOLN
500.0000 mg | Freq: Once | INTRAVENOUS | Status: AC
  Administered 2019-07-27: 15:00:00 500 mg via TOPICAL
  Filled 2019-07-27: qty 10

## 2019-07-27 MED ORDER — SILVER NITRATE-POT NITRATE 75-25 % EX MISC
3.0000 "application " | Freq: Once | CUTANEOUS | Status: AC
  Administered 2019-07-27: 3 via TOPICAL
  Filled 2019-07-27: qty 20

## 2019-07-27 MED ORDER — OXYMETAZOLINE HCL 0.05 % NA SOLN
1.0000 | Freq: Once | NASAL | Status: AC
  Administered 2019-07-27: 1 via NASAL
  Filled 2019-07-27: qty 30

## 2019-07-27 MED ORDER — ACETAMINOPHEN 500 MG PO TABS
1000.0000 mg | ORAL_TABLET | Freq: Once | ORAL | Status: AC
  Administered 2019-07-27: 16:00:00 1000 mg via ORAL
  Filled 2019-07-27: qty 2

## 2019-07-27 NOTE — ED Triage Notes (Signed)
Pt arrives via pov after dialysis, pt started with nosebleed, clamp placed, pt reports blood is running down her throat. Pt encouraged to keep head forward.

## 2019-07-27 NOTE — ED Provider Notes (Signed)
[facility] Emergency Department Provider Note  ____________________________________________   First MD Initiated Contact with Patient 07/27/19 1358     (approximate)  I have reviewed the triage vital signs and the nursing notes.  History  Chief Complaint Epistaxis    HPI Kimberly Shaffer is a 68 y.o. female with hx of ESRD, on Plavix but no other anticoagulation or antiplatelet medication who presents to the ED for epistaxis. Patient states she has had off and on bleeding since this morning. Initially started around 9 AM, and then stopped prior to going to dialysis. Completed dialysis w/o issue. This afternoon started bleeding again and was unable to slow it down, therefore presented to the ED. No known trauma.    Past Medical Hx Past Medical History:  Diagnosis Date  . Asthma   . CAD (coronary artery disease)   . Chronic lower back pain   . COPD (chronic obstructive pulmonary disease) ([place])   . Depression   . Diabetes mellitus without complication (HCC)    NIDD  . H/O angina pectoris   . H/O blood clots   . Hypercholesteremia   . Hypertension   . Left rotator cuff tear   . Myocardial infarction ([place])   . Obesity   . Renal insufficiency   . Stroke ([place])   . Vertigo, aural     Problem List Patient Active Problem List   Diagnosis Date Noted  . Chest pain 07/06/2019  . Acute on chronic respiratory failure with hypoxemia ([place]) 06/19/2018  . Acute respiratory failure ([place]) 06/19/2018  . Acute on chronic congestive heart failure ([place])   . ESRD on hemodialysis ([place])   . Palliative care by specialist   . Acute respiratory failure with hypoxemia ([employer]) 04/18/2018  . Anemia of chronic disease 03/24/2018  . NSTEMI (non-ST elevated myocardial infarction) ([place]) 03/23/2018  . Acute on chronic diastolic CHF (congestive heart failure) ([place]) 03/02/2018  . GERD (gastroesophageal reflux disease) 03/02/2018  . Respiratory failure ([place]) 01/11/2018  .  Abdominal pain 06/28/2017  . Anxiety, generalized 06/27/2017  . COPD (chronic obstructive pulmonary disease) ([place]) 06/26/2017  . Peritonitis ([place]) 05/28/2017  . Bilateral carotid artery stenosis 03/08/2017  . ESRD on dialysis ([place]) 03/08/2017  . Non-ST elevation myocardial infarction (NSTEMI), subendocardial infarction, subsequent episode of care ([place]) 11/04/2016  . Onychogryphosis 10/31/2016  . Onychomycosis 10/31/2016  . Subungual exostosis 10/31/2016  . Type 2 diabetes mellitus with diabetic neuropathy ([place]) 10/31/2016  . Chronic osteomyelitis of left hand ([place]) 09/03/2016  . Left arm swelling 08/30/2016  . Pre-operative clearance 08/30/2016  . Coronary artery disease of native artery of native heart with stable angina pectoris ([place]) 08/03/2016  . Benign paroxysmal positional vertigo due to bilateral vestibular disorder 07/26/2016  . H/O: CVA (cerebrovascular accident) 07/26/2016  . Stable angina ([place]) 07/05/2016  . Sepsis due to pneumonia ([place]) 07/05/2016  . Fracture of left foot 04/26/2016  . Pre-transplant evaluation for kidney transplant 02/04/2016  . Chest pain at rest 12/17/2015  . Ischemic chest pain ([place]) 12/17/2015  . DJD of shoulder 10/21/2015  . Perianal lesion 05/20/2015  . 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 Lumbar radiculopathy 05/14/2015  . 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 Benign essential hypertension 04/22/2015  . Grief 03/18/2015  . Lumbosacral facet joint syndrome 02/16/2015  . Greater trochanteric bursitis 02/16/2015  . DDD (degenerative disc disease), lumbosacral 01/20/2015  . 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 Osteoarthritis 01/20/2015  . IDA (iron  deficiency anemia) 03/25/2014  . Thyroid nodule 01/15/2014  . Lung nodule 08/28/2013  . Tobacco abuse 05/17/2013  . Mixed hyperlipidemia 06/14/2011  . OSA on CPAP 06/14/2011  . PAD (peripheral artery disease) ([place]) 06/14/2011  . Depression  05/05/2011  . Chronic kidney disease (CKD), stage V ([place]) 03/26/2011  . Multiple vessel coronary artery disease 03/26/2011  . Obesity, unspecified 03/26/2011  . Type 2 diabetes mellitus ([place]) 03/26/2011    Past Surgical Hx Past Surgical History:  Procedure Laterality Date  . A/V FISTULAGRAM Left 03/14/2017   Procedure: A/V Fistulagram;  Surgeon: Katha Cabal, MD;  Location: [place] [facility];  Service: Cardiovascular;  Laterality: Left;  . AV FISTULA PLACEMENT Left 11-04-2015  . AV FISTULA PLACEMENT Left 11/04/2015   Procedure: ARTERIOVENOUS (AV) FISTULA CREATION  ( BRACHIAL CEPHALIC );  Surgeon: Katha Cabal, MD;  Location: [facility];  Service: Vascular;  Laterality: Left;  . CARDIAC CATHETERIZATION    . CARDIAC CATHETERIZATION Left 07/19/2016   Procedure: Left Heart Cath and Coronary Angiography;  Surgeon: Corey Skains, MD;  Location: [place] [facility];  Service: Cardiovascular;  Laterality: Left;  . CORONARY ARTERY BYPASS GRAFT  2012  . CORONARY STENT PLACEMENT    . JOINT REPLACEMENT    . KNEE SURGERY Bilateral   . LEFT HEART CATH AND CORONARY ANGIOGRAPHY N/A 12/25/2017   Procedure: LEFT HEART CATH AND CORONARY ANGIOGRAPHY;  Surgeon: Teodoro Spray, MD;  Location: [place] [facility];  Service: Cardiovascular;  Laterality: N/A;  . LEFT HEART CATH AND CORONARY ANGIOGRAPHY Right 03/23/2018   Procedure: LEFT HEART CATH AND CORONARY ANGIOGRAPHY;  Surgeon: Dionisio David, MD;  Location: [place] [facility];  Service: Cardiovascular;  Laterality: Right;  . LEFT HEART CATH AND CORONARY ANGIOGRAPHY N/A 05/30/2018   Procedure: LEFT HEART CATH AND CORONARY ANGIOGRAPHY;  Surgeon: Corey Skains, MD;  Location: [place] [facility];  Service: Cardiovascular;  Laterality: N/A;  . PERIPHERAL VASCULAR CATHETERIZATION N/A 11/13/2015   Procedure: Dialysis/Perma Catheter Insertion;  Surgeon: Katha Cabal, MD;  Location: [place] [facility];  Service: Cardiovascular;   Laterality: N/A;  . PERIPHERAL VASCULAR CATHETERIZATION N/A 12/04/2015   Procedure: Dialysis/Perma Catheter Insertion;  Surgeon: Katha Cabal, MD;  Location: [place] [facility];  Service: Cardiovascular;  Laterality: N/A;  . PERIPHERAL VASCULAR CATHETERIZATION Left 12/31/2015   Procedure: A/V Shuntogram/Fistulagram;  Surgeon: Algernon Huxley, MD;  Location: [place] [facility];  Service: Cardiovascular;  Laterality: Left;  . PERIPHERAL VASCULAR CATHETERIZATION N/A 12/31/2015   Procedure: A/V Shunt Intervention;  Surgeon: Algernon Huxley, MD;  Location: [place] [facility];  Service: Cardiovascular;  Laterality: N/A;  . PERIPHERAL VASCULAR CATHETERIZATION Left 02/25/2016   Procedure: A/V Shuntogram/Fistulagram;  Surgeon: Algernon Huxley, MD;  Location: [place] [facility];  Service: Cardiovascular;  Laterality: Left;  . PERIPHERAL VASCULAR CATHETERIZATION N/A 03/18/2016   Procedure: Dialysis/Perma Catheter Removal;  Surgeon: Katha Cabal, MD;  Location: [place] [facility];  Service: Cardiovascular;  Laterality: N/A;  . REMOVAL OF A DIALYSIS CATHETER N/A 06/30/2017   Procedure: REMOVAL OF A DIALYSIS CATHETER;  Surgeon: Katha Cabal, MD;  Location: [facility];  Service: Vascular;  Laterality: N/A;  . REPLACEMENT TOTAL KNEE BILATERAL Bilateral     Medications Prior to Admission medications   Medication Sig Start Date End Date Taking? Authorizing Provider  acetaminophen (TYLENOL) 325 MG tablet Take 2 tablets (650 mg total) by mouth every 6 (six) hours as needed for  mild pain (or Fever >/= 101). 05/30/18   Awilda Bill, NP  albuterol (PROVENTIL) (2.5 MG/3ML) 0.083% nebulizer solution Take 3 mLs (2.5 mg total) by nebulization every 4 (four) hours as needed for wheezing or shortness of breath. 05/30/18   Awilda Bill, NP  albuterol (VENTOLIN HFA) 108 (90 Base) MCG/ACT inhaler Inhale 1 puff into the lungs every 6 (six) hours as needed for wheezing or shortness of breath.    [provider]  amLODipine (NORVASC) 10 MG tablet Take 10 mg by mouth daily.    [provider]  aspirin 81 MG chewable tablet Chew 81 mg by mouth daily.    [provider]  atorvastatin (LIPITOR) 40 MG tablet Take 40 mg by mouth at bedtime. 03/28/18   [provider]  budesonide-formoterol (SYMBICORT) 160-4.5 MCG/ACT inhaler Inhale 2 puffs into the lungs 2 (two) times daily.    [provider]  calcium carbonate (TUMS - DOSED IN MG ELEMENTAL CALCIUM) 500 MG chewable tablet Chew 2 tablets by mouth 3 (three) times daily.    [provider]  cetirizine (ZYRTEC) 10 MG tablet Take 10 mg by mouth daily.    [provider]  cinacalcet (SENSIPAR) 30 MG tablet Take 1 tablet (30 mg total) by mouth daily before breakfast. 05/31/18   Awilda Bill, NP  clopidogrel (PLAVIX) 75 MG tablet Take 1 tablet (75 mg total) by mouth daily. 05/31/18   Awilda Bill, NP  docusate sodium (COLACE) 100 MG capsule Take 1 capsule (100 mg total) by mouth 2 (two) times daily. 05/30/18   Awilda Bill, NP  fluticasone (FLONASE) 50 MCG/ACT nasal spray Place 2 sprays into both nostrils 2 (two) times daily.    [provider]  fluticasone furoate-vilanterol (BREO ELLIPTA) 200-25 MCG/INH AEPB Inhale 1 puff into the lungs daily. 05/31/18   Awilda Bill, NP  folic acid (FOLVITE) 1 MG tablet Take 1 mg by mouth daily.    [provider]  isosorbide mononitrate (IMDUR) 30 MG 24 hr tablet Take 30 mg by mouth daily.    [provider]  lidocaine-prilocaine (EMLA) cream Apply 1 application topically as needed (topical anesthesia for hemodialysis if Gebauers and Lidocaine injection are ineffective.). 05/30/18   Awilda Bill, NP  lubiprostone (AMITIZA) 24 MCG capsule Take 24 mcg by mouth 2 (two) times daily with a meal.    [provider]  meclizine (ANTIVERT) 25 MG tablet Take 25 mg by mouth 3 (three) times daily as needed for dizziness.    [provider]  metoprolol succinate (TOPROL-XL) 100 MG 24 hr tablet Take 1 tablet (100 mg total) by mouth daily with breakfast. Take with or immediately following a meal. 05/31/18   Awilda Bill, NP  multivitamin (RENA-VIT) TABS tablet Take 1 tablet by mouth at bedtime. 05/30/18   Awilda Bill, NP  ondansetron (ZOFRAN) 4 MG tablet Take 1 tablet (4 mg total) by mouth every 6 (six) hours as needed for nausea. 05/30/18   Awilda Bill, NP  Oxycodone HCl 10 MG TABS Take 10 mg by mouth every 6 (six) hours as needed (pain).    [provider]  pantoprazole (PROTONIX) 40 MG tablet Take 40 mg by mouth 2 (two) times daily.    [provider]  polyethylene glycol (MIRALAX / GLYCOLAX) packet Take 17 g by mouth daily. 05/31/18   Awilda Bill, NP  ranolazine (RANEXA) 500 MG 12 hr tablet Take 500 mg by  mouth 2 (two) times daily. 04/13/18   [provider]  sevelamer carbonate (RENVELA) 800 MG tablet Take 2,400 mg by mouth 3 (three) times daily with meals.     [provider]  tiotropium (SPIRIVA HANDIHALER) 18 MCG inhalation capsule Place 1 capsule (18 mcg total) into inhaler and inhale daily. 05/31/18   Awilda Bill, NP  torsemide (DEMADEX) 100 MG tablet Take 100 mg by mouth daily.    [provider]  venlafaxine XR (EFFEXOR-XR) 37.5 MG 24 hr capsule Take 37.5 mg by mouth daily with breakfast.    [provider]    Allergies Nystatin, Sulfa antibiotics, and Niaspan [niacin]  Family Hx Family History  Problem Relation Age of Onset  . Cancer Mother   . Cancer Father   . Diabetes Brother   . Heart disease Brother     Social Hx Social History   Tobacco Use  . Smoking status: Current Every Day Smoker    Packs/day: 0.25    Types: Cigarettes  . Smokeless tobacco: Never Used  Substance Use Topics  . Alcohol use: No    Alcohol/week: 0.0 standard drinks  . Drug use: No     Review of Systems  Constitutional: Negative for fever,  chills. Eyes: Negative for visual changes. ENT: + epistaxis Cardiovascular: Negative for chest pain. Respiratory: Negative for shortness of breath. Gastrointestinal: Negative for nausea, vomiting.  Genitourinary: Negative for dysuria. Musculoskeletal: Negative for leg swelling. Skin: Negative for rash. Neurological: Negative for for headaches.   Physical Exam  Vital Signs: ED Triage Vitals  Enc Vitals Group     BP 07/27/19 1346 118/63     Pulse Rate 07/27/19 1346 89     Resp 07/27/19 1346 16     Temp --      Temp src --      SpO2 07/27/19 1346 94 %     Weight 07/27/19 1345 227 lb (103 kg)     Height 07/27/19 1345 5\' 6"  (1.676 m)     Head Circumference --      Peak Flow --      Pain Score 07/27/19 1345 0     Pain Loc --      Pain Edu? --      Excl. in [place]? --     Constitutional: Alert and oriented.  Head: Normocephalic. Atraumatic. Eyes: Conjunctivae clear. Sclera anicteric. Nose: + epistaxis, suspect primarily from L nares though on initial evaluation bleeding from bilateral nares is noted. Mouth/Throat: Mucous membranes are moist.  Neck: No stridor.   Cardiovascular: Normal rate. Extremities well perfused. Respiratory: Normal respiratory effort.   Musculoskeletal: No deformities. Neurologic:  Normal speech and language. No gross focal neurologic deficits are appreciated.  Skin: Skin is warm, dry and intact.  Psychiatric: Mood and affect are appropriate for situation.  EKG  N/A    Radiology  N/A   Procedures  Procedure(s) performed (including critical care):  Procedures   Epistaxis treatment: - Bilateral nares cleared - Afrin sprayed to bilateral nares - Bilateral nares packed with TXA soaked guaze x 30 minutes - After removal of guaze, successful cessation of bleeding - No identifiable culprit lesions amenable to silver nitrate cauterization - Additional spray of Afrin to LEFT nares (suspect initial etiology/side)   Initial Impression /  Assessment and Plan / ED Course  68 y.o. female who presents to the ED for epistaxis.  Treated as above with good response. If she remains without bleeding after 30 minute observation period, will  plan for discharge.     Final Clinical Impression(s) / ED Diagnosis  Final diagnoses:  Epistaxis       Note:  This document was prepared using Dragon voice recognition software and may include unintentional dictation errors.   Lilia Pro., MD 07/27/19 1911

## 2019-07-27 NOTE — ED Provider Notes (Signed)
-----------------------------------------   4:57 PM on 07/27/2019 -----------------------------------------  Blood pressure 128/85, pulse 68, temperature 98.5 F (36.9 C), temperature source Oral, resp. rate 17, height 5\' 6"  (1.676 m), weight 103 kg, SpO2 99 %.  Assuming care from Dr. Joan Mayans.  In short, Kimberly Shaffer is a 68 y.o. female with a chief complaint of Epistaxis .  Refer to the original H&P for additional details.  The current plan of care is to reassess for recurrent bleeding after repeat administration of Afrin as well as TXA for epistaxis.  Patient with no recurrence of bleeding about an hour after additional Afrin was given.  She is requesting to be discharged home at this time.  Counseled to return to the ED for new or worsening symptoms, otherwise follow-up with her PCP.  Patient agrees with plan.    Blake Divine, MD 07/27/19 [phone]

## 2019-07-27 NOTE — Discharge Instructions (Signed)
Thank you for letting us take care of you in the emergency department today.  ° °Please continue to take any regular, prescribed medications.  ° °Please follow up with: °- Your primary care doctor to review your ER visit and follow up on your symptoms.  ° °Please return to the ER for any new or worsening symptoms.  ° °

## 2019-07-27 NOTE — ED Notes (Signed)
Pt remains bleed free at this time. MD notified. Pt requesting to be d/c.

## 2019-07-27 NOTE — ED Provider Notes (Signed)
-----------------------------------------   3:52 PM on 07/27/2019 -----------------------------------------  Blood pressure 118/63, pulse 89, resp. rate 16, height 5\' 6"  (1.676 m), weight 103 kg, SpO2 94 %.  Assuming care from Dr. Joan Mayans.  In short, Kimberly Shaffer is a 68 y.o. female with a chief complaint of Epistaxis .  Refer to the original H&P for additional details.  The current plan of care is to reassess in 30 minutes if epistaxis resolved following reapplication of afrin.  Patient with no further bleeding approximately 1 hour after receiving additional dose of Afrin.  She is requesting to be discharged home, which is appropriate.  Counseled patient to follow-up with her PCP and otherwise return to the ED for new or worsening symptoms, patient agrees with plan.    Blake Divine, MD 07/27/19 [phone]

## 2019-08-05 ENCOUNTER — Inpatient Hospital Stay
Admission: EM | Admit: 2019-08-05 | Discharge: 2019-08-07 | DRG: 302 | Disposition: A | Discharge status: Home / Self Care | Payer: Medicare Other | Attending: Internal Medicine | Admitting: Internal Medicine

## 2019-08-05 ENCOUNTER — Emergency Department: Payer: Medicare Other

## 2019-08-05 DIAGNOSIS — R079 Chest pain, unspecified: Secondary | ICD-10-CM | POA: Diagnosis not present

## 2019-08-05 DIAGNOSIS — Z8249 Family history of ischemic heart disease and other diseases of the circulatory system: Secondary | ICD-10-CM

## 2019-08-05 DIAGNOSIS — I10 Essential (primary) hypertension: Secondary | ICD-10-CM | POA: Diagnosis present

## 2019-08-05 DIAGNOSIS — R600 Localized edema: Secondary | ICD-10-CM

## 2019-08-05 DIAGNOSIS — I25110 Atherosclerotic heart disease of native coronary artery with unstable angina pectoris: Principal | ICD-10-CM | POA: Diagnosis present

## 2019-08-05 DIAGNOSIS — I25118 Atherosclerotic heart disease of native coronary artery with other forms of angina pectoris: Secondary | ICD-10-CM

## 2019-08-05 DIAGNOSIS — I25700 Atherosclerosis of coronary artery bypass graft(s), unspecified, with unstable angina pectoris: Secondary | ICD-10-CM | POA: Diagnosis present

## 2019-08-05 DIAGNOSIS — Z955 Presence of coronary angioplasty implant and graft: Secondary | ICD-10-CM

## 2019-08-05 DIAGNOSIS — D638 Anemia in other chronic diseases classified elsewhere: Secondary | ICD-10-CM | POA: Diagnosis present

## 2019-08-05 DIAGNOSIS — I5032 Chronic diastolic (congestive) heart failure: Secondary | ICD-10-CM | POA: Diagnosis present

## 2019-08-05 DIAGNOSIS — I252 Old myocardial infarction: Secondary | ICD-10-CM

## 2019-08-05 DIAGNOSIS — Z20828 Contact with and (suspected) exposure to other viral communicable diseases: Secondary | ICD-10-CM | POA: Diagnosis present

## 2019-08-05 DIAGNOSIS — F329 Major depressive disorder, single episode, unspecified: Secondary | ICD-10-CM | POA: Diagnosis present

## 2019-08-05 DIAGNOSIS — I132 Hypertensive heart and chronic kidney disease with heart failure and with stage 5 chronic kidney disease, or end stage renal disease: Secondary | ICD-10-CM | POA: Diagnosis present

## 2019-08-05 DIAGNOSIS — F411 Generalized anxiety disorder: Secondary | ICD-10-CM | POA: Diagnosis present

## 2019-08-05 DIAGNOSIS — Z96653 Presence of artificial knee joint, bilateral: Secondary | ICD-10-CM | POA: Diagnosis present

## 2019-08-05 DIAGNOSIS — Z7951 Long term (current) use of inhaled steroids: Secondary | ICD-10-CM

## 2019-08-05 DIAGNOSIS — E119 Type 2 diabetes mellitus without complications: Secondary | ICD-10-CM

## 2019-08-05 DIAGNOSIS — M545 Low back pain: Secondary | ICD-10-CM | POA: Diagnosis present

## 2019-08-05 DIAGNOSIS — Z9989 Dependence on other enabling machines and devices: Secondary | ICD-10-CM

## 2019-08-05 DIAGNOSIS — Z7982 Long term (current) use of aspirin: Secondary | ICD-10-CM

## 2019-08-05 DIAGNOSIS — Z79899 Other long term (current) drug therapy: Secondary | ICD-10-CM

## 2019-08-05 DIAGNOSIS — K219 Gastro-esophageal reflux disease without esophagitis: Secondary | ICD-10-CM | POA: Diagnosis present

## 2019-08-05 DIAGNOSIS — G4733 Obstructive sleep apnea (adult) (pediatric): Secondary | ICD-10-CM | POA: Diagnosis present

## 2019-08-05 DIAGNOSIS — Z882 Allergy status to sulfonamides status: Secondary | ICD-10-CM

## 2019-08-05 DIAGNOSIS — Z809 Family history of malignant neoplasm, unspecified: Secondary | ICD-10-CM

## 2019-08-05 DIAGNOSIS — Z992 Dependence on renal dialysis: Secondary | ICD-10-CM

## 2019-08-05 DIAGNOSIS — Z66 Do not resuscitate: Secondary | ICD-10-CM | POA: Diagnosis present

## 2019-08-05 DIAGNOSIS — N186 End stage renal disease: Secondary | ICD-10-CM

## 2019-08-05 DIAGNOSIS — E782 Mixed hyperlipidemia: Secondary | ICD-10-CM | POA: Diagnosis present

## 2019-08-05 DIAGNOSIS — E1151 Type 2 diabetes mellitus with diabetic peripheral angiopathy without gangrene: Secondary | ICD-10-CM | POA: Diagnosis present

## 2019-08-05 DIAGNOSIS — D631 Anemia in chronic kidney disease: Secondary | ICD-10-CM | POA: Diagnosis present

## 2019-08-05 DIAGNOSIS — Z833 Family history of diabetes mellitus: Secondary | ICD-10-CM

## 2019-08-05 DIAGNOSIS — Z7902 Long term (current) use of antithrombotics/antiplatelets: Secondary | ICD-10-CM

## 2019-08-05 DIAGNOSIS — Z8673 Personal history of transient ischemic attack (TIA), and cerebral infarction without residual deficits: Secondary | ICD-10-CM

## 2019-08-05 DIAGNOSIS — J449 Chronic obstructive pulmonary disease, unspecified: Secondary | ICD-10-CM | POA: Diagnosis present

## 2019-08-05 DIAGNOSIS — I200 Unstable angina: Secondary | ICD-10-CM

## 2019-08-05 DIAGNOSIS — M7121 Synovial cyst of popliteal space [Baker], right knee: Secondary | ICD-10-CM | POA: Diagnosis present

## 2019-08-05 DIAGNOSIS — G8929 Other chronic pain: Secondary | ICD-10-CM | POA: Diagnosis present

## 2019-08-05 DIAGNOSIS — F17210 Nicotine dependence, cigarettes, uncomplicated: Secondary | ICD-10-CM | POA: Diagnosis present

## 2019-08-05 DIAGNOSIS — I472 Ventricular tachycardia: Secondary | ICD-10-CM | POA: Diagnosis not present

## 2019-08-05 DIAGNOSIS — Z888 Allergy status to other drugs, medicaments and biological substances status: Secondary | ICD-10-CM

## 2019-08-05 DIAGNOSIS — R251 Tremor, unspecified: Secondary | ICD-10-CM | POA: Diagnosis not present

## 2019-08-05 DIAGNOSIS — E1122 Type 2 diabetes mellitus with diabetic chronic kidney disease: Secondary | ICD-10-CM | POA: Diagnosis present

## 2019-08-05 LAB — CBC
HCT: 29.7 % — ABNORMAL LOW (ref 36.0–46.0)
Hemoglobin: 9.1 g/dL — ABNORMAL LOW (ref 12.0–15.0)
MCH: 31.8 pg (ref 26.0–34.0)
MCHC: 30.6 g/dL (ref 30.0–36.0)
MCV: 103.8 fL — ABNORMAL HIGH (ref 80.0–100.0)
Platelets: 204 10*3/uL (ref 150–400)
RBC: 2.86 MIL/uL — ABNORMAL LOW (ref 3.87–5.11)
RDW: 14.7 % (ref 11.5–15.5)
WBC: 6.5 10*3/uL (ref 4.0–10.5)
nRBC: 0 % (ref 0.0–0.2)

## 2019-08-05 MED ORDER — ONDANSETRON HCL 4 MG/2ML IJ SOLN
4.0000 mg | Freq: Once | INTRAMUSCULAR | Status: AC
  Administered 2019-08-05: 4 mg via INTRAVENOUS
  Filled 2019-08-05: qty 2

## 2019-08-05 MED ORDER — MORPHINE SULFATE (PF) 4 MG/ML IV SOLN: 4.0000 mg | INTRAVENOUS | Status: DC | PRN

## 2019-08-05 MED ORDER — HEPARIN BOLUS VIA INFUSION
4000.0000 [IU] | Freq: Once | INTRAVENOUS | Status: AC
  Administered 2019-08-06: 4000 [IU] via INTRAVENOUS
  Filled 2019-08-05: qty 4000

## 2019-08-05 MED ORDER — NITROGLYCERIN 0.4 MG SL SUBL
0.4000 mg | SUBLINGUAL_TABLET | SUBLINGUAL | Status: DC | PRN
  Administered 2019-08-07: 0.4 mg via SUBLINGUAL
  Filled 2019-08-05: qty 1

## 2019-08-05 MED ORDER — HYDROCODONE-ACETAMINOPHEN 5-325 MG PO TABS
1.0000 | ORAL_TABLET | Freq: Once | ORAL | Status: AC
  Administered 2019-08-05: 1 via ORAL
  Filled 2019-08-05: qty 1

## 2019-08-05 MED ORDER — HEPARIN (PORCINE) 25000 UT/250ML-% IV SOLN
1000.0000 [IU]/h | INTRAVENOUS | Status: DC
  Administered 2019-08-06 (×2): 1000 [IU]/h via INTRAVENOUS
  Filled 2019-08-05 (×3): qty 250

## 2019-08-05 NOTE — ED Notes (Signed)
Pt given non-slip socks. Placed on pt by this EDT. Pt ambulatory to room toilet with steady gait. Warm blanket and pillow provided. Pt now lying in bed on monitor. Complains of "arm pain". Pt tried repositioning in bed. Will notify RN.

## 2019-08-05 NOTE — ED Provider Notes (Signed)
[facility] Emergency Department Provider Note    First MD Initiated Contact with Patient 08/05/19 2125     (approximate)  I have reviewed the triage vital signs and the nursing notes.   HISTORY  Chief Complaint Chest Pain    HPI Kimberly Shaffer is a 68 y.o. female extensive past medical history as listed below presents to the ER for chest pain and pressure radiating to the left arm.  States it feels similar to previous heart attacks.  Started this evening.  Occurred when she was just at rest.  Denied any diaphoresis.  Is having some nausea with it.  States the pain is mild to moderate.  No ripping or tearing pain through to her back.  She does get dialysis with last dialysis session being Saturday.  She denies any fevers.  No cough.    Past Medical History:  Diagnosis Date   Asthma    CAD (coronary artery disease)    Chronic lower back pain    COPD (chronic obstructive pulmonary disease) (HCC)    Depression    Diabetes mellitus without complication (HCC)    NIDD   H/O angina pectoris    H/O blood clots    Hypercholesteremia    Hypertension    Left rotator cuff tear    Myocardial infarction ([place])    Obesity    Renal insufficiency    Stroke (HCC)    Vertigo, aural    Family History  Problem Relation Age of Onset   Cancer Mother    Cancer Father    Diabetes Brother    Heart disease Brother    Past Surgical History:  Procedure Laterality Date   A/V FISTULAGRAM Left 03/14/2017   Procedure: A/V Fistulagram;  Surgeon: Katha Cabal, MD;  Location: [place] [facility];  Service: Cardiovascular;  Laterality: Left;   AV FISTULA PLACEMENT Left 11-04-2015   AV FISTULA PLACEMENT Left 11/04/2015   Procedure: ARTERIOVENOUS (AV) FISTULA CREATION  ( BRACHIAL CEPHALIC );  Surgeon: Katha Cabal, MD;  Location: [facility];  Service: Vascular;  Laterality: Left;   CARDIAC CATHETERIZATION     CARDIAC CATHETERIZATION Left  07/19/2016   Procedure: Left Heart Cath and Coronary Angiography;  Surgeon: Corey Skains, MD;  Location: [place] [facility];  Service: Cardiovascular;  Laterality: Left;   CORONARY ARTERY BYPASS GRAFT  2012   CORONARY STENT PLACEMENT     JOINT REPLACEMENT     KNEE SURGERY Bilateral    LEFT HEART CATH AND CORONARY ANGIOGRAPHY N/A 12/25/2017   Procedure: LEFT HEART CATH AND CORONARY ANGIOGRAPHY;  Surgeon: Teodoro Spray, MD;  Location: [place] [facility];  Service: Cardiovascular;  Laterality: N/A;   LEFT HEART CATH AND CORONARY ANGIOGRAPHY Right 03/23/2018   Procedure: LEFT HEART CATH AND CORONARY ANGIOGRAPHY;  Surgeon: Dionisio David, MD;  Location: [place] [facility];  Service: Cardiovascular;  Laterality: Right;   LEFT HEART CATH AND CORONARY ANGIOGRAPHY N/A 05/30/2018   Procedure: LEFT HEART CATH AND CORONARY ANGIOGRAPHY;  Surgeon: Corey Skains, MD;  Location: [place] [facility];  Service: Cardiovascular;  Laterality: N/A;   PERIPHERAL VASCULAR CATHETERIZATION N/A 11/13/2015   Procedure: Dialysis/Perma Catheter Insertion;  Surgeon: Katha Cabal, MD;  Location: [place] [facility];  Service: Cardiovascular;  Laterality: N/A;   PERIPHERAL VASCULAR CATHETERIZATION N/A 12/04/2015   Procedure: Dialysis/Perma Catheter Insertion;  Surgeon: Katha Cabal, MD;  Location: [place] [facility];  Service: Cardiovascular;  Laterality:  N/A;   PERIPHERAL VASCULAR CATHETERIZATION Left 12/31/2015   Procedure: A/V Shuntogram/Fistulagram;  Surgeon: Algernon Huxley, MD;  Location: [place] [facility];  Service: Cardiovascular;  Laterality: Left;   PERIPHERAL VASCULAR CATHETERIZATION N/A 12/31/2015   Procedure: A/V Shunt Intervention;  Surgeon: Algernon Huxley, MD;  Location: [place] [facility];  Service: Cardiovascular;  Laterality: N/A;   PERIPHERAL VASCULAR CATHETERIZATION Left 02/25/2016   Procedure: A/V Shuntogram/Fistulagram;  Surgeon: Algernon Huxley, MD;  Location: [place] [facility];  Service: Cardiovascular;  Laterality: Left;   PERIPHERAL VASCULAR CATHETERIZATION N/A 03/18/2016   Procedure: Dialysis/Perma Catheter Removal;  Surgeon: Katha Cabal, MD;  Location: [place] [facility];  Service: Cardiovascular;  Laterality: N/A;   REMOVAL OF A DIALYSIS CATHETER N/A 06/30/2017   Procedure: REMOVAL OF A DIALYSIS CATHETER;  Surgeon: Katha Cabal, MD;  Location: [facility];  Service: Vascular;  Laterality: N/A;   REPLACEMENT TOTAL KNEE BILATERAL Bilateral    Patient Active Problem List   Diagnosis Date Noted   Chest pain 07/06/2019   Acute on chronic respiratory failure with hypoxemia ([place]) 06/19/2018   Acute respiratory failure ([place]) 06/19/2018   Acute on chronic congestive heart failure ([place])    ESRD on hemodialysis ([place])    Palliative care by specialist    Acute respiratory failure with hypoxemia ([place]) 04/18/2018   Anemia of chronic disease 03/24/2018   NSTEMI (non-ST elevated myocardial infarction) ([place]) 03/23/2018   Acute on chronic diastolic CHF (congestive heart failure) ([place]) 03/02/2018   GERD (gastroesophageal reflux disease) 03/02/2018   Respiratory failure ([place]) 01/11/2018   Abdominal pain 06/28/2017   Anxiety, generalized 06/27/2017   COPD (chronic obstructive pulmonary disease) ([place]) 06/26/2017   Peritonitis ([place]) 05/28/2017   Bilateral carotid artery stenosis 03/08/2017   ESRD on dialysis ([place]) 03/08/2017   Non-ST elevation myocardial infarction (NSTEMI), subendocardial infarction, subsequent episode of care ([place]) 11/04/2016   Onychogryphosis 10/31/2016   Onychomycosis 10/31/2016   Subungual exostosis 10/31/2016   Type 2 diabetes mellitus with diabetic neuropathy ([place]) 10/31/2016   Chronic osteomyelitis of left hand ([place]) 09/03/2016   Left arm swelling 08/30/2016   Pre-operative clearance 08/30/2016   Coronary artery disease of native artery of native heart with stable angina pectoris ([place]) 08/03/2016    Benign paroxysmal positional vertigo due to bilateral vestibular disorder 07/26/2016   H/O: CVA (cerebrovascular accident) 07/26/2016   Stable angina ([place]) 07/05/2016   Sepsis due to pneumonia ([place]) 07/05/2016   Fracture of left foot 04/26/2016   Pre-transplant evaluation for kidney transplant 02/04/2016   Chest pain at rest 12/17/2015   Ischemic chest pain ([place]) 12/17/2015   DJD of shoulder 10/21/2015   Perianal lesion 05/20/2015   Total knee replacement status 05/14/2015   Lumbar radiculopathy 05/14/2015   S/P total knee replacement 04/29/2015   Benign essential hypertension 04/22/2015   Grief 03/18/2015   Lumbosacral facet joint syndrome 02/16/2015   Greater trochanteric bursitis 02/16/2015   DDD (degenerative disc disease), lumbosacral 01/20/2015   DJD (degenerative joint disease) of knee total knee replacement 01/20/2015   Osteoarthritis 01/20/2015   IDA (iron deficiency anemia) 03/25/2014   Thyroid nodule 01/15/2014   Lung nodule 08/28/2013   Tobacco abuse 05/17/2013   Mixed hyperlipidemia 06/14/2011   OSA on CPAP 06/14/2011   PAD (peripheral artery disease) ([place]) 06/14/2011   Depression 05/05/2011   Chronic kidney disease (CKD), stage V ([place]) 03/26/2011   Multiple vessel coronary artery disease 03/26/2011   Obesity, unspecified 03/26/2011   Type 2 diabetes  mellitus ([place]) 03/26/2011      Prior to Admission medications   Medication Sig Start Date End Date Taking? Authorizing Provider  acetaminophen (TYLENOL) 325 MG tablet Take 2 tablets (650 mg total) by mouth every 6 (six) hours as needed for mild pain (or Fever >/= 101). 05/30/18   Awilda Bill, NP  albuterol (PROVENTIL) (2.5 MG/3ML) 0.083% nebulizer solution Take 3 mLs (2.5 mg total) by nebulization every 4 (four) hours as needed for wheezing or shortness of breath. 05/30/18   Awilda Bill, NP  albuterol (VENTOLIN HFA) 108 (90 Base) MCG/ACT inhaler Inhale 1 puff into the lungs every  6 (six) hours as needed for wheezing or shortness of breath.    [provider]  amLODipine (NORVASC) 10 MG tablet Take 10 mg by mouth daily.    [provider]  aspirin 81 MG chewable tablet Chew 81 mg by mouth daily.    [provider]  atorvastatin (LIPITOR) 40 MG tablet Take 40 mg by mouth at bedtime. 03/28/18   [provider]  budesonide-formoterol (SYMBICORT) 160-4.5 MCG/ACT inhaler Inhale 2 puffs into the lungs 2 (two) times daily.    [provider]  calcium carbonate (TUMS - DOSED IN MG ELEMENTAL CALCIUM) 500 MG chewable tablet Chew 2 tablets by mouth 3 (three) times daily.    [provider]  cetirizine (ZYRTEC) 10 MG tablet Take 10 mg by mouth daily.    [provider]  cinacalcet (SENSIPAR) 30 MG tablet Take 1 tablet (30 mg total) by mouth daily before breakfast. 05/31/18   Awilda Bill, NP  clopidogrel (PLAVIX) 75 MG tablet Take 1 tablet (75 mg total) by mouth daily. 05/31/18   Awilda Bill, NP  docusate sodium (COLACE) 100 MG capsule Take 1 capsule (100 mg total) by mouth 2 (two) times daily. 05/30/18   Awilda Bill, NP  fluticasone (FLONASE) 50 MCG/ACT nasal spray Place 2 sprays into both nostrils 2 (two) times daily.    [provider]  fluticasone furoate-vilanterol (BREO ELLIPTA) 200-25 MCG/INH AEPB Inhale 1 puff into the lungs daily. 05/31/18   Awilda Bill, NP  folic acid (FOLVITE) 1 MG tablet Take 1 mg by mouth daily.    [provider]  isosorbide mononitrate (IMDUR) 30 MG 24 hr tablet Take 30 mg by mouth daily.    [provider]  lidocaine-prilocaine (EMLA) cream Apply 1 application topically as needed (topical anesthesia for hemodialysis if Gebauers and Lidocaine injection are ineffective.). 05/30/18   Awilda Bill, NP  lubiprostone (AMITIZA) 24 MCG capsule Take 24 mcg by mouth 2 (two) times daily with a meal.    [provider]  meclizine (ANTIVERT) 25 MG tablet  Take 25 mg by mouth 3 (three) times daily as needed for dizziness.    [provider]  metoprolol succinate (TOPROL-XL) 100 MG 24 hr tablet Take 1 tablet (100 mg total) by mouth daily with breakfast. Take with or immediately following a meal. 05/31/18   Awilda Bill, NP  multivitamin (RENA-VIT) TABS tablet Take 1 tablet by mouth at bedtime. 05/30/18   Awilda Bill, NP  ondansetron (ZOFRAN) 4 MG tablet Take 1 tablet (4 mg total) by mouth every 6 (six) hours as needed for nausea. 05/30/18   Awilda Bill, NP  Oxycodone HCl 10 MG TABS Take 10 mg by mouth every 6 (six) hours as needed (pain).    [provider]  pantoprazole (PROTONIX) 40 MG tablet Take 40 mg  by mouth 2 (two) times daily.    [provider]  polyethylene glycol (MIRALAX / GLYCOLAX) packet Take 17 g by mouth daily. 05/31/18   Awilda Bill, NP  ranolazine (RANEXA) 500 MG 12 hr tablet Take 500 mg by mouth 2 (two) times daily. 04/13/18   [provider]  sevelamer carbonate (RENVELA) 800 MG tablet Take 2,400 mg by mouth 3 (three) times daily with meals.     [provider]  tiotropium (SPIRIVA HANDIHALER) 18 MCG inhalation capsule Place 1 capsule (18 mcg total) into inhaler and inhale daily. 05/31/18   Awilda Bill, NP  torsemide (DEMADEX) 100 MG tablet Take 100 mg by mouth daily.    [provider]  venlafaxine XR (EFFEXOR-XR) 37.5 MG 24 hr capsule Take 37.5 mg by mouth daily with breakfast.    [provider]    Allergies Nystatin, Sulfa antibiotics, and Niaspan [niacin]    Social History Social History   Tobacco Use   Smoking status: Current Every Day Smoker    Packs/day: 0.25    Types: Cigarettes   Smokeless tobacco: Never Used  Substance Use Topics   Alcohol use: No    Alcohol/week: 0.0 standard drinks   Drug use: No    Review of Systems Patient denies headaches, rhinorrhea, blurry vision, numbness, shortness of breath, chest pain, edema,  cough, abdominal pain, nausea, vomiting, diarrhea, dysuria, fevers, rashes or hallucinations unless otherwise stated above in HPI. ____________________________________________   PHYSICAL EXAM:  VITAL SIGNS: Vitals:   08/05/19 2238 08/05/19 2328  BP: 131/65 (!) 144/118  Pulse: 89 79  Resp: (!) 23 18  Temp:    SpO2: 98% 93%    Constitutional: Alert and oriented. Uncomfortable appearing Eyes: Conjunctivae are normal.  Head: Atraumatic. Nose: No congestion/rhinnorhea. Mouth/Throat: Mucous membranes are moist.   Neck: No stridor. Painless ROM.  Cardiovascular: Normal rate, regular rhythm. Grossly normal heart sounds.  Good peripheral circulation. Respiratory: Normal respiratory effort.  No retractions. Lungs CTAB. Gastrointestinal: Soft and nontender. No distention. No abdominal bruits. No CVA tenderness. Genitourinary:  Musculoskeletal: No lower extremity tenderness nor edema.  No joint effusions. Neurologic:  Normal speech and language. No gross focal neurologic deficits are appreciated. No facial droop Skin:  Skin is warm, dry and intact. No rash noted. Psychiatric: Mood and affect are normal. Speech and behavior are normal.  ____________________________________________   LABS (all labs ordered are listed, but only abnormal results are displayed)  Results for orders placed or performed during the hospital encounter of 08/05/19 (from the past 24 hour(s))  CBC     Status: Abnormal   Collection Time: 08/05/19  9:36 PM  Result Value Ref Range   WBC 6.5 4.0 - 10.5 K/uL   RBC 2.86 (L) 3.87 - 5.11 MIL/uL   Hemoglobin 9.1 (L) 12.0 - 15.0 g/dL   HCT 29.7 (L) 36.0 - 46.0 %   MCV 103.8 (H) 80.0 - 100.0 fL   MCH 31.8 26.0 - 34.0 pg   MCHC 30.6 30.0 - 36.0 g/dL   RDW 14.7 11.5 - 15.5 %   Platelets 204 150 - 400 K/uL   nRBC 0.0 0.0 - 0.2 %   ____________________________________________  EKG My review and personal interpretation at Time:21:29   Indication: chest pain  Rate: 90   Rhythm: sinus Axis: normal Other: lateral st depression consistent with previous tracings ____________________________________________  RADIOLOGY  I personally reviewed all radiographic images ordered to evaluate for the above acute complaints and reviewed radiology reports and  findings.  These findings were personally discussed with the patient.  Please see medical record for radiology report.  ____________________________________________   PROCEDURES  Procedure(s) performed:  .Critical Care Performed by: Merlyn Lot, MD Authorized by: Merlyn Lot, MD   Critical care provider statement:    Critical care time (minutes):  30   Critical care time was exclusive of:  Separately billable procedures and treating other patients   Critical care was necessary to treat or prevent imminent or life-threatening deterioration of the following conditions:  Cardiac failure   Critical care was time spent personally by me on the following activities:  Development of treatment plan with patient or surrogate, discussions with consultants, evaluation of patient's response to treatment, examination of patient, obtaining history from patient or surrogate, ordering and performing treatments and interventions, ordering and review of laboratory studies, ordering and review of radiographic studies, pulse oximetry, re-evaluation of patient's condition and review of old charts      Critical Care performed: yes ____________________________________________   INITIAL IMPRESSION / [place] / ED COURSE  Pertinent labs & imaging results that were available during my care of the patient were reviewed by me and considered in my medical decision making (see chart for details).   DDX: ACS, pericarditis, pna, bronchitis, costochondritis, covid, pe, dissection   Kimberly Shaffer is a 68 y.o. who presents to the ED with chest pain and pressure as described above.  Patient with extensive cardiac  pathology and history.  Concerning for ACS.  EKG does show ST depressions which are similar to previous EKGs but may be a little bit more pronounced today.  No STEMI criteria.  No recent illness.  Chest x-ray does show some pulmonary vascular congestion.  She is not hypoxic so have a lower suspicion for CHF exacerbation does not seem consistent with PE.  Diagnostic work-up limited due to poor IV access and delay in laboratory evaluation.  Patient started on heparin.  Will require hospitalization for further medical work-up.  Will be signed out to oncoming physician Dr. Karma Greaser to follow up on labs.     The patient was evaluated in Emergency Department today for the symptoms described in the history of present illness. He/she was evaluated in the context of the global COVID-19 pandemic, which necessitated consideration that the patient might be at risk for infection with the SARS-CoV-2 virus that causes COVID-19. Institutional protocols and algorithms that pertain to the evaluation of patients at risk for COVID-19 are in a state of rapid change based on information released by regulatory bodies including the CDC and federal and state organizations. These policies and algorithms were followed during the patient's care in the ED.  As part of my medical decision making, I reviewed the following data within the [facility] notes reviewed and incorporated, Labs reviewed, notes from prior ED visits and [place] Controlled Substance Database   ____________________________________________   FINAL CLINICAL IMPRESSION(S) / ED DIAGNOSES  Final diagnoses:  Chest pain, unspecified type      NEW MEDICATIONS STARTED DURING THIS VISIT:  New Prescriptions   No medications on file     Note:  This document was prepared using Dragon voice recognition software and may include unintentional dictation errors.    Merlyn Lot, MD 08/06/19 0001

## 2019-08-05 NOTE — ED Triage Notes (Signed)
Pt to ED via [facility] from home. Pt arrives c/o left sided chest pain radiating down left arm. Pt is dialysis pt, last treatment was Saturday. Pt states she feels nauseous.

## 2019-08-05 NOTE — Progress Notes (Signed)
ANTICOAGULATION CONSULT NOTE - Initial Consult  Pharmacy Consult for heparin Indication: chest pain/ACS  Allergies  Allergen Reactions  . Nystatin Hives and Itching  . Sulfa Antibiotics Swelling, Hives and Rash  . Niaspan [Niacin] Other (See Comments) and Hives    Patient Measurements: Height: 5\' 6"  (167.6 cm) Weight: 226 lb 13.7 oz (102.9 kg) IBW/kg (Calculated) : 59.3 Heparin Dosing Weight: 76.74 kg  Vital Signs: Temp: 98.2 F (36.8 C) (11/16 2124) Temp Source: Oral (11/16 2124) BP: 144/118 (11/16 2328) Pulse Rate: 79 (11/16 2328)  Labs: Recent Labs    08/05/19 2136  HGB 9.1*  HCT 29.7*  PLT 204    CrCl cannot be calculated (Patient's most recent lab result is older than the maximum 21 days allowed.).   Medical History: Past Medical History:  Diagnosis Date  . Asthma   . CAD (coronary artery disease)   . Chronic lower back pain   . COPD (chronic obstructive pulmonary disease) ([place])   . Depression   . Diabetes mellitus without complication (HCC)    NIDD  . H/O angina pectoris   . H/O blood clots   . Hypercholesteremia   . Hypertension   . Left rotator cuff tear   . Myocardial infarction ([place])   . Obesity   . Renal insufficiency   . Stroke ([place])   . Vertigo, aural     Medications:  Scheduled:  . [START ON 08/06/2019] heparin  4,000 Units Intravenous Once    Assessment: Patient arrives to ED w/ h/o of ESRD on dialysis C/O chest pain down left arm, EKG showing some ST depression, no anticoagulation PTA, trops pending. Patient is being started on heparin drip for ACS/NSTEMI  Goal of Therapy:  Heparin level 0.3-0.7 units/ml Monitor platelets by anticoagulation protocol: Yes   Plan:  Will bolus heparin 4000 units IV x 1 Will start rate at 1000 units/hr. Baseline CBC appears stable for patient Baseline aPTT/INR pending Will check anti-Xa at 0800. Will monitor daily CBC's and adjust per anti-Xa levels.  Tobie Lords, PharmD, BCPS Clinical  Pharmacist 08/05/2019,11:51 PM

## 2019-08-06 ENCOUNTER — Observation Stay: Payer: Medicare Other

## 2019-08-06 DIAGNOSIS — I132 Hypertensive heart and chronic kidney disease with heart failure and with stage 5 chronic kidney disease, or end stage renal disease: Secondary | ICD-10-CM | POA: Diagnosis present

## 2019-08-06 DIAGNOSIS — I472 Ventricular tachycardia: Secondary | ICD-10-CM | POA: Diagnosis not present

## 2019-08-06 DIAGNOSIS — E782 Mixed hyperlipidemia: Secondary | ICD-10-CM

## 2019-08-06 DIAGNOSIS — I208 Other forms of angina pectoris: Secondary | ICD-10-CM

## 2019-08-06 DIAGNOSIS — I25110 Atherosclerotic heart disease of native coronary artery with unstable angina pectoris: Secondary | ICD-10-CM | POA: Diagnosis present

## 2019-08-06 DIAGNOSIS — I25118 Atherosclerotic heart disease of native coronary artery with other forms of angina pectoris: Secondary | ICD-10-CM | POA: Diagnosis not present

## 2019-08-06 DIAGNOSIS — E1122 Type 2 diabetes mellitus with diabetic chronic kidney disease: Secondary | ICD-10-CM | POA: Diagnosis present

## 2019-08-06 DIAGNOSIS — D638 Anemia in other chronic diseases classified elsewhere: Secondary | ICD-10-CM | POA: Diagnosis not present

## 2019-08-06 DIAGNOSIS — I10 Essential (primary) hypertension: Secondary | ICD-10-CM

## 2019-08-06 DIAGNOSIS — Z8249 Family history of ischemic heart disease and other diseases of the circulatory system: Secondary | ICD-10-CM | POA: Diagnosis not present

## 2019-08-06 DIAGNOSIS — J449 Chronic obstructive pulmonary disease, unspecified: Secondary | ICD-10-CM

## 2019-08-06 DIAGNOSIS — M545 Low back pain: Secondary | ICD-10-CM | POA: Diagnosis present

## 2019-08-06 DIAGNOSIS — G4733 Obstructive sleep apnea (adult) (pediatric): Secondary | ICD-10-CM | POA: Diagnosis present

## 2019-08-06 DIAGNOSIS — I25700 Atherosclerosis of coronary artery bypass graft(s), unspecified, with unstable angina pectoris: Secondary | ICD-10-CM | POA: Diagnosis present

## 2019-08-06 DIAGNOSIS — R079 Chest pain, unspecified: Secondary | ICD-10-CM | POA: Diagnosis present

## 2019-08-06 DIAGNOSIS — E1151 Type 2 diabetes mellitus with diabetic peripheral angiopathy without gangrene: Secondary | ICD-10-CM | POA: Diagnosis present

## 2019-08-06 DIAGNOSIS — I5032 Chronic diastolic (congestive) heart failure: Secondary | ICD-10-CM | POA: Diagnosis present

## 2019-08-06 DIAGNOSIS — N186 End stage renal disease: Secondary | ICD-10-CM | POA: Diagnosis present

## 2019-08-06 DIAGNOSIS — Z992 Dependence on renal dialysis: Secondary | ICD-10-CM

## 2019-08-06 DIAGNOSIS — I200 Unstable angina: Secondary | ICD-10-CM | POA: Diagnosis not present

## 2019-08-06 DIAGNOSIS — Z96653 Presence of artificial knee joint, bilateral: Secondary | ICD-10-CM | POA: Diagnosis present

## 2019-08-06 DIAGNOSIS — I252 Old myocardial infarction: Secondary | ICD-10-CM | POA: Diagnosis not present

## 2019-08-06 DIAGNOSIS — Z66 Do not resuscitate: Secondary | ICD-10-CM | POA: Diagnosis present

## 2019-08-06 DIAGNOSIS — Z955 Presence of coronary angioplasty implant and graft: Secondary | ICD-10-CM | POA: Diagnosis not present

## 2019-08-06 DIAGNOSIS — Z9989 Dependence on other enabling machines and devices: Secondary | ICD-10-CM

## 2019-08-06 DIAGNOSIS — Z7902 Long term (current) use of antithrombotics/antiplatelets: Secondary | ICD-10-CM | POA: Diagnosis not present

## 2019-08-06 DIAGNOSIS — Z833 Family history of diabetes mellitus: Secondary | ICD-10-CM | POA: Diagnosis not present

## 2019-08-06 DIAGNOSIS — Z20828 Contact with and (suspected) exposure to other viral communicable diseases: Secondary | ICD-10-CM | POA: Diagnosis present

## 2019-08-06 DIAGNOSIS — K219 Gastro-esophageal reflux disease without esophagitis: Secondary | ICD-10-CM | POA: Diagnosis present

## 2019-08-06 DIAGNOSIS — G8929 Other chronic pain: Secondary | ICD-10-CM | POA: Diagnosis present

## 2019-08-06 DIAGNOSIS — Z809 Family history of malignant neoplasm, unspecified: Secondary | ICD-10-CM | POA: Diagnosis not present

## 2019-08-06 LAB — BASIC METABOLIC PANEL
Anion gap: 18 — ABNORMAL HIGH (ref 5–15)
BUN: 56 mg/dL — ABNORMAL HIGH (ref 8–23)
CO2: 22 mmol/L (ref 22–32)
Calcium: 7.9 mg/dL — ABNORMAL LOW (ref 8.9–10.3)
Chloride: 99 mmol/L (ref 98–111)
Creatinine, Ser: 7.94 mg/dL — ABNORMAL HIGH (ref 0.44–1.00)
GFR calc Af Amer: 5 mL/min — ABNORMAL LOW (ref >60)
GFR calc non Af Amer: 5 mL/min — ABNORMAL LOW (ref >60)
Glucose, Bld: 143 mg/dL — ABNORMAL HIGH (ref 70–99)
Potassium: 4.1 mmol/L (ref 3.5–5.1)
Sodium: 139 mmol/L (ref 135–145)

## 2019-08-06 LAB — PROTIME-INR
INR: 1 (ref 0.8–1.2)
Prothrombin Time: 13.3 seconds (ref 11.4–15.2)

## 2019-08-06 LAB — GLUCOSE, CAPILLARY
Glucose-Capillary: 54 mg/dL — ABNORMAL LOW (ref 70–99)
Glucose-Capillary: 86 mg/dL (ref 70–99)
Glucose-Capillary: 118 mg/dL — ABNORMAL HIGH (ref 70–99)

## 2019-08-06 LAB — RENAL FUNCTION PANEL
Albumin: 3.1 g/dL — ABNORMAL LOW (ref 3.5–5.0)
Anion gap: 15 (ref 5–15)
BUN: 57 mg/dL — ABNORMAL HIGH (ref 8–23)
CO2: 23 mmol/L (ref 22–32)
Calcium: 7.3 mg/dL — ABNORMAL LOW (ref 8.9–10.3)
Chloride: 100 mmol/L (ref 98–111)
Creatinine, Ser: 8.02 mg/dL — ABNORMAL HIGH (ref 0.44–1.00)
GFR calc Af Amer: 5 mL/min — ABNORMAL LOW (ref >60)
GFR calc non Af Amer: 5 mL/min — ABNORMAL LOW (ref >60)
Glucose, Bld: 139 mg/dL — ABNORMAL HIGH (ref 70–99)
Phosphorus: 7.4 mg/dL — ABNORMAL HIGH (ref 2.5–4.6)
Potassium: 4.7 mmol/L (ref 3.5–5.1)
Sodium: 138 mmol/L (ref 135–145)

## 2019-08-06 LAB — HEPARIN LEVEL (UNFRACTIONATED)
Heparin Unfractionated: 0.42 IU/mL (ref 0.30–0.70)
Heparin Unfractionated: 0.45 IU/mL (ref 0.30–0.70)

## 2019-08-06 LAB — APTT: aPTT: 36 seconds (ref 24–36)

## 2019-08-06 LAB — SARS CORONAVIRUS 2 (TAT 6-24 HRS): SARS Coronavirus 2: NEGATIVE

## 2019-08-06 LAB — TROPONIN I (HIGH SENSITIVITY)
Troponin I (High Sensitivity): 48 ng/L — ABNORMAL HIGH (ref <18)
Troponin I (High Sensitivity): 75 ng/L — ABNORMAL HIGH (ref <18)
Troponin I (High Sensitivity): 89 ng/L — ABNORMAL HIGH (ref <18)

## 2019-08-06 MED ORDER — ACETAMINOPHEN 325 MG PO TABS: 650.0000 mg | ORAL_TABLET | ORAL | Status: DC | PRN

## 2019-08-06 MED ORDER — VENLAFAXINE HCL ER 37.5 MG PO CP24
37.5000 mg | ORAL_CAPSULE | Freq: Every day | ORAL | Status: DC
  Administered 2019-08-06 – 2019-08-07 (×2): 37.5 mg via ORAL
  Filled 2019-08-06 (×3): qty 1

## 2019-08-06 MED ORDER — ATORVASTATIN CALCIUM 80 MG PO TABS
80.0000 mg | ORAL_TABLET | Freq: Every day | ORAL | Status: DC
  Administered 2019-08-06: 80 mg via ORAL
  Filled 2019-08-06: qty 4
  Filled 2019-08-06: qty 1
  Filled 2019-08-06: qty 4

## 2019-08-06 MED ORDER — TIOTROPIUM BROMIDE MONOHYDRATE 18 MCG IN CAPS
1.0000 | ORAL_CAPSULE | Freq: Every day | RESPIRATORY_TRACT | Status: DC
  Administered 2019-08-06 – 2019-08-07 (×2): 18 ug via RESPIRATORY_TRACT
  Filled 2019-08-06: qty 5

## 2019-08-06 MED ORDER — RANOLAZINE ER 500 MG PO TB12
500.0000 mg | ORAL_TABLET | Freq: Two times a day (BID) | ORAL | Status: DC
  Administered 2019-08-06 (×2): 500 mg via ORAL
  Filled 2019-08-06 (×4): qty 1

## 2019-08-06 MED ORDER — CHLORHEXIDINE GLUCONATE CLOTH 2 % EX PADS
6.0000 | MEDICATED_PAD | Freq: Every day | CUTANEOUS | Status: DC
  Administered 2019-08-07: 6 via TOPICAL
  Filled 2019-08-06 (×2): qty 6

## 2019-08-06 MED ORDER — ALBUTEROL SULFATE (2.5 MG/3ML) 0.083% IN NEBU: 3.0000 mL | INHALATION_SOLUTION | Freq: Four times a day (QID) | RESPIRATORY_TRACT | Status: DC | PRN

## 2019-08-06 MED ORDER — ASPIRIN 81 MG PO CHEW
81.0000 mg | CHEWABLE_TABLET | Freq: Every day | ORAL | Status: DC
  Administered 2019-08-06 – 2019-08-07 (×2): 81 mg via ORAL
  Filled 2019-08-06 (×2): qty 1

## 2019-08-06 MED ORDER — ASPIRIN 81 MG PO CHEW
324.0000 mg | CHEWABLE_TABLET | Freq: Once | ORAL | Status: AC
  Administered 2019-08-06: 324 mg via ORAL
  Filled 2019-08-06: qty 4

## 2019-08-06 MED ORDER — NITROGLYCERIN 2 % TD OINT
1.0000 [in_us] | TOPICAL_OINTMENT | Freq: Once | TRANSDERMAL | Status: AC
  Administered 2019-08-06: 1 [in_us] via TOPICAL
  Filled 2019-08-06: qty 1

## 2019-08-06 MED ORDER — CLOPIDOGREL BISULFATE 75 MG PO TABS
75.0000 mg | ORAL_TABLET | Freq: Every day | ORAL | Status: DC
  Administered 2019-08-06 – 2019-08-07 (×2): 75 mg via ORAL
  Filled 2019-08-06 (×3): qty 1

## 2019-08-06 MED ORDER — FLUTICASONE FUROATE-VILANTEROL 200-25 MCG/INH IN AEPB
1.0000 | INHALATION_SPRAY | Freq: Every day | RESPIRATORY_TRACT | Status: DC
  Administered 2019-08-06 – 2019-08-07 (×2): 1 via RESPIRATORY_TRACT
  Filled 2019-08-06: qty 28

## 2019-08-06 MED ORDER — ISOSORBIDE MONONITRATE ER 30 MG PO TB24
30.0000 mg | ORAL_TABLET | Freq: Every day | ORAL | Status: DC
  Filled 2019-08-06 (×2): qty 1

## 2019-08-06 MED ORDER — METOPROLOL SUCCINATE ER 100 MG PO TB24
100.0000 mg | ORAL_TABLET | Freq: Every day | ORAL | Status: DC
  Administered 2019-08-07: 100 mg via ORAL
  Filled 2019-08-06: qty 1
  Filled 2019-08-06 (×2): qty 4

## 2019-08-06 MED ORDER — ONDANSETRON HCL 4 MG/2ML IJ SOLN
4.0000 mg | Freq: Four times a day (QID) | INTRAMUSCULAR | Status: DC | PRN
  Administered 2019-08-06 – 2019-08-07 (×2): 4 mg via INTRAVENOUS
  Filled 2019-08-06 (×2): qty 2

## 2019-08-06 MED ORDER — TORSEMIDE 20 MG PO TABS
100.0000 mg | ORAL_TABLET | Freq: Every day | ORAL | Status: DC
  Administered 2019-08-06 – 2019-08-07 (×2): 100 mg via ORAL
  Filled 2019-08-06: qty 5
  Filled 2019-08-06 (×2): qty 1

## 2019-08-06 NOTE — Progress Notes (Signed)
[place] for heparin Indication: chest pain/ACS  Allergies  Allergen Reactions  . Nystatin Hives and Itching  . Sulfa Antibiotics Swelling, Hives and Rash  . Niaspan [Niacin] Other (See Comments) and Hives    Patient Measurements: Height: 5\' 6"  (167.6 cm) Weight: 226 lb 13.7 oz (102.9 kg) IBW/kg (Calculated) : 59.3 Heparin Dosing Weight:   Vital Signs: Temp: 98.4 F (36.9 C) (11/17 1612) Temp Source: Oral (11/17 1612) BP: 154/70 (11/17 1612) Pulse Rate: 89 (11/17 1612)  Labs: Recent Labs    08/05/19 2136 08/05/19 2328 08/06/19 0013 08/06/19 0143 08/06/19 0426 08/06/19 0840 08/06/19 1716  HGB 9.1*  --   --   --   --   --   --   HCT 29.7*  --   --   --   --   --   --   PLT 204  --   --   --   --   --   --   APTT  --   --  36  --   --   --   --   LABPROT  --   --  13.3  --   --   --   --   INR  --   --  1.0  --   --   --   --   HEPARINUNFRC  --   --   --   --   --  0.42 0.45  CREATININE  --  7.94*  --   --  8.02*  --   --   TROPONINIHS  --  48*  --  75* 89*  --   --     Estimated Creatinine Clearance: 8.1 mL/min (A) (by C-G formula based on SCr of 8.02 mg/dL (H)).   Medical History: Past Medical History:  Diagnosis Date  . Asthma   . CAD (coronary artery disease)   . Chronic lower back pain   . COPD (chronic obstructive pulmonary disease) ([place])   . Depression   . Diabetes mellitus without complication (HCC)    NIDD  . H/O angina pectoris   . H/O blood clots   . Hypercholesteremia   . Hypertension   . Left rotator cuff tear   . Myocardial infarction ([place])   . Obesity   . Renal insufficiency   . Stroke ([place])   . Vertigo, aural     Medications:  Scheduled:  . aspirin  81 mg Oral Daily  . atorvastatin  80 mg Oral QHS  . Chlorhexidine Gluconate Cloth  6 each Topical Q0600  . clopidogrel  75 mg Oral Daily  . fluticasone furoate-vilanterol  1 puff Inhalation Daily  . isosorbide mononitrate  30 mg Oral Daily  .  metoprolol succinate  100 mg Oral Q breakfast  . ranolazine  500 mg Oral BID  . tiotropium  1 capsule Inhalation Daily  . torsemide  100 mg Oral Daily  . venlafaxine XR  37.5 mg Oral Q breakfast    Assessment: Patient arrives to ED w/ h/o of ESRD on dialysis C/O chest pain down left arm, EKG showing some ST depression, no anticoagulation PTA, trops pending. Patient is being started on heparin drip for ACS/NSTEMI  11/16: Will bolus heparin 4000 units IV x 1 Will start rate at 1000 units/hr.  11/17 0840 HL= 0.42.  11/17 1716 HL= 0.45. Therapeutic x2. Per nurse, heparin has not been interrupted but does acknowledges patient was in dialysis earlier today  and was not informed of heparin interruptions (if there were any).   Goal of Therapy:  Heparin level 0.3-0.7 units/ml Monitor platelets by anticoagulation protocol: Yes   Plan:  Will continue current drip rate of 1000 units/hr and check heparin with AM labs.  Will monitor daily CBC's and adjust per anti-Xa levels.  Kristeen Miss, PharmD Clinical Pharmacist 08/06/2019

## 2019-08-06 NOTE — ED Notes (Signed)
Dialysis called reporting they are ready for patient in dialysis. Cardiologist remains at bedside at this time.

## 2019-08-06 NOTE — Progress Notes (Signed)
PT Cancellation Note  Patient Details Name: Kimberly Shaffer MRN: [id number] DOB: December 08, 1950   Cancelled Treatment:    Reason Eval/Treat Not Completed: Medical issues which prohibited therapy.  Upon entering room pt c/o feeling hungry and "shakey".  Requested BG from nursing with result of 54 falling below guidelines for participation with PT services.  Will attempt to see pt at a future date/time as medically appropriate.       Linus Salmons PT, DPT 08/06/19, 3:11 PM

## 2019-08-06 NOTE — ED Notes (Signed)
Patients bed switched out for hospital bed.

## 2019-08-06 NOTE — Progress Notes (Signed)
[place] for heparin Indication: chest pain/ACS  Allergies  Allergen Reactions  . Nystatin Hives and Itching  . Sulfa Antibiotics Swelling, Hives and Rash  . Niaspan [Niacin] Other (See Comments) and Hives    Patient Measurements: Height: 5\' 6"  (167.6 cm) Weight: 226 lb 13.7 oz (102.9 kg) IBW/kg (Calculated) : 59.3 Heparin Dosing Weight:   Vital Signs: BP: 119/64 (11/17 0921) Pulse Rate: 67 (11/17 0920)  Labs: Recent Labs    08/05/19 2136 08/05/19 2328 08/06/19 0013 08/06/19 0143 08/06/19 0426 08/06/19 0840  HGB 9.1*  --   --   --   --   --   HCT 29.7*  --   --   --   --   --   PLT 204  --   --   --   --   --   APTT  --   --  36  --   --   --   LABPROT  --   --  13.3  --   --   --   INR  --   --  1.0  --   --   --   HEPARINUNFRC  --   --   --   --   --  0.42  CREATININE  --  7.94*  --   --  8.02*  --   TROPONINIHS  --  48*  --  75* 89*  --     Estimated Creatinine Clearance: 8.1 mL/min (A) (by C-G formula based on SCr of 8.02 mg/dL (H)).   Medical History: Past Medical History:  Diagnosis Date  . Asthma   . CAD (coronary artery disease)   . Chronic lower back pain   . COPD (chronic obstructive pulmonary disease) ([place])   . Depression   . Diabetes mellitus without complication (HCC)    NIDD  . H/O angina pectoris   . H/O blood clots   . Hypercholesteremia   . Hypertension   . Left rotator cuff tear   . Myocardial infarction ([place])   . Obesity   . Renal insufficiency   . Stroke ([place])   . Vertigo, aural     Medications:  Scheduled:  . aspirin  81 mg Oral Daily  . atorvastatin  80 mg Oral QHS  . Chlorhexidine Gluconate Cloth  6 each Topical Q0600  . clopidogrel  75 mg Oral Daily  . fluticasone furoate-vilanterol  1 puff Inhalation Daily  . isosorbide mononitrate  30 mg Oral Daily  . metoprolol succinate  100 mg Oral Q breakfast  . ranolazine  500 mg Oral BID  . tiotropium  1 capsule Inhalation Daily  .  torsemide  100 mg Oral Daily  . venlafaxine XR  37.5 mg Oral Q breakfast    Assessment: Patient arrives to ED w/ h/o of ESRD on dialysis C/O chest pain down left arm, EKG showing some ST depression, no anticoagulation PTA, trops pending. Patient is being started on heparin drip for ACS/NSTEMI  11/16: Will bolus heparin 4000 units IV x 1 Will start rate at 1000 units/hr.  Goal of Therapy:  Heparin level 0.3-0.7 units/ml Monitor platelets by anticoagulation protocol: Yes   Plan:  11/17 0840 HL= 0.42. Will continue current drip rate and  will check confirmatory HL in 8 hrs Will monitor daily CBC's and adjust per anti-Xa levels.  Chinita Greenland PharmD Clinical Pharmacist 08/06/2019

## 2019-08-06 NOTE — Progress Notes (Signed)
Patient ID: Kimberly Shaffer, female   DOB: Sep 15, 1951, 68 y.o.   MRN: [id number] [facility] PROGRESS NOTE  Kimberly Shaffer [id number] DOB: [birth year] DOA: 08/05/2019 PCP: Clinic, [facility]  HPI/Subjective: Patient seen down at dialysis.  Feeling better now.  Patient placed on heparin drip.  Came in with chest pain and shortness of breath since yesterday.  It lasted 4 to 5 hours.  She took 2 isosorbide's and went away eventually.  She had some nausea vomiting yesterday.  No sweating.  Objective: Vitals:   08/06/19 1330 08/06/19 1355  BP: 136/72   Pulse: 80 79  Resp: (!) 21 15  Temp:    SpO2: 100% 100%    Filed Weights   08/05/19 2122  Weight: 102.9 kg    ROS: Review of Systems  Constitutional: Negative for chills and fever.  Eyes: Negative for blurred vision.  Respiratory: Negative for cough and shortness of breath.   Cardiovascular: Negative for chest pain.  Gastrointestinal: Negative for abdominal pain, constipation, diarrhea, nausea and vomiting.  Genitourinary: Negative for dysuria.  Musculoskeletal: Negative for joint pain.  Neurological: Positive for tremors. Negative for dizziness and headaches.   Exam: Physical Exam  Constitutional: She is oriented to person, place, and time.  HENT:  Nose: No mucosal edema.  Mouth/Throat: No oropharyngeal exudate or posterior oropharyngeal edema.  Eyes: Pupils are equal, round, and reactive to light. Conjunctivae, EOM and lids are normal.  Neck: No JVD present. Carotid bruit is not present. No edema present. No thyroid mass and no thyromegaly present.  Cardiovascular: S1 normal and S2 normal. Exam reveals no gallop.  No murmur heard. Respiratory: No respiratory distress. She has no wheezes. She has no rhonchi. She has no rales.  GI: Soft. Bowel sounds are normal. There is no abdominal tenderness.  Musculoskeletal:     Right ankle: She exhibits swelling.     Left ankle: She exhibits swelling.  Lymphadenopathy:     She has no cervical adenopathy.  Neurological: She is alert and oriented to person, place, and time. No cranial nerve deficit.  Skin: Skin is warm. No rash noted. Nails show no clubbing.  Psychiatric: She has a normal mood and affect.      Data Reviewed: Basic Metabolic Panel: Recent Labs  Lab 08/05/19 2328 08/06/19 0426  NA 139 138  K 4.1 4.7  CL 99 100  CO2 22 23  GLUCOSE 143* 139*  BUN 56* 57*  CREATININE 7.94* 8.02*  CALCIUM 7.9* 7.3*  PHOS  --  7.4*   Liver Function Tests: Recent Labs  Lab 08/06/19 0426  ALBUMIN 3.1*   CBC: Recent Labs  Lab 08/05/19 2136  WBC 6.5  HGB 9.1*  HCT 29.7*  MCV 103.8*  PLT 204   BNP (last 3 results) Recent Labs    11/27/18 1427 07/06/19 0756  BNP 588.0* 271.0*      Recent Results (from the past 240 hour(s))  SARS CORONAVIRUS 2 (TAT 6-24 HRS) Nasopharyngeal Nasopharyngeal Swab     Status: None   Collection Time: 08/06/19 12:26 AM   Specimen: Nasopharyngeal Swab  Result Value Ref Range Status   SARS Coronavirus 2 NEGATIVE NEGATIVE Final    Comment: (NOTE) SARS-CoV-2 target nucleic acids are NOT DETECTED. The SARS-CoV-2 RNA is generally detectable in upper and lower respiratory specimens during the acute phase of infection. Negative results do not preclude SARS-CoV-2 infection, do not rule out co-infections with other pathogens, and should not be used as the sole basis for treatment  or other patient management decisions. Negative results must be combined with clinical observations, patient history, and epidemiological information. The expected result is Negative. Fact Sheet for Patients: SugarRoll.be Fact Sheet for Healthcare Providers: https://www.woods-mathews.com/ This test is not yet approved or cleared by the [place] FDA and  has been authorized for detection and/or diagnosis of SARS-CoV-2 by FDA under an Emergency Use Authorization (EUA). This EUA will remain  in  effect (meaning this test can be used) for the duration of the COVID-19 declaration under Section 56 4(b)(1) of the Act, 21 U.S.C. section 360bbb-3(b)(1), unless the authorization is terminated or revoked sooner. Performed at [facility], [place] [address], [place], [place] [address]      Studies: US Venous Img Lower Unilateral Right (dvt)  Result Date: 08/06/2019 CLINICAL DATA:  Right leg swelling for 1 month. EXAM: RIGHT LOWER EXTREMITY VENOUS DOPPLER ULTRASOUND TECHNIQUE: Gray-scale sonography with graded compression, as well as color Doppler and duplex ultrasound were performed to evaluate the lower extremity deep venous systems from the level of the common femoral vein and including the common femoral, femoral, profunda femoral, popliteal and calf veins including the posterior tibial, peroneal and gastrocnemius veins when visible. The superficial great saphenous vein was also interrogated. Spectral Doppler was utilized to evaluate flow at rest and with distal augmentation maneuvers in the common femoral, femoral and popliteal veins. COMPARISON:  None. FINDINGS: Contralateral Common Femoral Vein: Respiratory phasicity is normal and symmetric with the symptomatic side. No evidence of thrombus. Normal compressibility. Common Femoral Vein: No evidence of thrombus. Normal compressibility, respiratory phasicity and response to augmentation. Saphenofemoral Junction: No evidence of thrombus. Normal compressibility and flow on color Doppler imaging. Profunda Femoral Vein: No evidence of thrombus. Normal compressibility and flow on color Doppler imaging. Femoral Vein: No evidence of thrombus. Normal compressibility, respiratory phasicity and response to augmentation. Popliteal Vein: No evidence of thrombus. Normal compressibility, respiratory phasicity and response to augmentation. Calf Veins: No evidence of thrombus. Normal compressibility and flow on color Doppler imaging. Superficial Great Saphenous  Vein: No evidence of thrombus. Normal compressibility. Venous Reflux:  None. Other Findings: Complex fluid collection in the right popliteal fossa measuring 8.1 2.9 x 2.5 cm, likely ossified intra-articular bodies. IMPRESSION: 1. No evidence of right lower extremity DVT. 2. Complex fluid collection in the right popliteal fossa likely Baker cyst containing probable intra-articular bodies. Electronically Signed   By: Keith Rake M.D.   On: 08/06/2019 04:34   Dg Chest Portable 1 View  Result Date: 08/05/2019 CLINICAL DATA:  Left side chest pain EXAM: PORTABLE CHEST 1 VIEW COMPARISON:  07/06/2019 FINDINGS: Cardiomegaly with vascular congestion. Diffuse interstitial prominence likely reflects interstitial edema. No confluent opacities or effusions. Aortic atherosclerosis. No acute bony abnormality. IMPRESSION: Cardiomegaly, suspect mild interstitial edema. Electronically Signed   By: Rolm Baptise M.D.   On: 08/05/2019 21:56    Scheduled Meds: . aspirin  81 mg Oral Daily  . atorvastatin  80 mg Oral QHS  . Chlorhexidine Gluconate Cloth  6 each Topical Q0600  . clopidogrel  75 mg Oral Daily  . fluticasone furoate-vilanterol  1 puff Inhalation Daily  . isosorbide mononitrate  30 mg Oral Daily  . metoprolol succinate  100 mg Oral Q breakfast  . ranolazine  500 mg Oral BID  . tiotropium  1 capsule Inhalation Daily  . torsemide  100 mg Oral Daily  . venlafaxine XR  37.5 mg Oral Q breakfast   Continuous Infusions: . heparin 1,000 Units/hr (08/06/19 0018)  Assessment/Plan:  1. Chest pain with history of triple-vessel disease.  Has had a CABG in the past.  Concerning for angina.  Patient started on heparin drip.  Cardiology recommends heparin drip for 24 to 48 hours.  Patient had an abnormal stress test on 07/22/2019 at [facility]. 2. End-stage renal disease on hemodialysis Tuesday Thursday and Saturday.  Patient seen at hemodialysis today. 3. COPD.  Respiratory status stable.  Continue  inhalers. 4. PAD.  Right lower extremity ultrasound negative for DVT but does have a Baker's cyst 5. Essential hypertension on Toprol, Imdur and torsemide 6. Hyperlipidemia on atorvastatin 7. Obstructive sleep apnea on oxygen at night  Code Status:     Code Status Orders  (From admission, onward)         Start     Ordered   08/06/19 0058  Do not attempt resuscitation (DNR)  Continuous    Question Answer Comment  In the event of cardiac or respiratory ARREST Do not call a "code blue"   In the event of cardiac or respiratory ARREST Do not perform Intubation, CPR, defibrillation or ACLS   In the event of cardiac or respiratory ARREST Use medication by any route, position, wound care, and other measures to relive pain and suffering. May use oxygen, suction and manual treatment of airway obstruction as needed for comfort.   Comments nurse may pronounce      08/06/19 0101        Code Status History    Date Active Date Inactive Code Status Order ID Comments User Context   07/06/2019 1351 07/08/2019 1714 DNR [id number]  Vaughan Basta, MD ED   06/19/2018 0854 06/21/2018 2113 DNR [id number]  Arta Silence, MD ED   05/29/2018 0618 05/30/2018 2328 DNR [id number]  Harrie Foreman, MD ED   04/18/2018 0250 04/19/2018 1925 DNR [id number]  Harrie Foreman, MD ED   03/23/2018 0955 03/23/2018 2048 DNR [id number]  Loletha Grayer, MD Inpatient   03/23/2018 0500 03/23/2018 0955 Full Code [id number]  Arta Silence, MD Inpatient   03/20/2018 0605 03/21/2018 1313 Full Code [id number]  Harrie Foreman, MD Inpatient   03/02/2018 0313 03/03/2018 1952 Full Code [id number]  Lance Coon, MD ED   02/26/2018 0716 02/27/2018 1821 Full Code [id number]  Arta Silence, MD Inpatient   01/11/2018 2030 01/14/2018 1509 Full Code [id number]  Saundra Shelling, MD Inpatient   12/25/2017 0835 12/25/2017 1743 Full Code [id number]  Teodoro Spray, MD Inpatient   12/23/2017 0927 12/25/2017 0834 Full Code [id number]  Harrie Foreman, MD Inpatient   12/08/2017 0357 12/08/2017 2344 Full Code [id number]  Harrie Foreman, MD Inpatient   06/28/2017 0749 07/01/2017 2218 Full Code [id number]  Harrie Foreman, MD Inpatient   07/19/2016 0857 07/19/2016 1319 Full Code [id number]  Corey Skains, MD Inpatient   07/05/2016 2236 07/09/2016 1950 Full Code [id number]  Harvie Bridge, DO Inpatient   12/17/2015 [id number] 12/20/2015 1957 Full Code [id number]  Saundra Shelling, MD Inpatient   Advance Care Planning Activity     Family Communication: Spoke with daughter on the phone Disposition Plan: Depending on how long cardiology wants to do heparin drip for will determine on when she can go home  Consultants:  Cardiology  Time spent: 35 minutes  [place]

## 2019-08-06 NOTE — ED Notes (Signed)
Purple DNR wristband placed on pt's right wrist at this time. 

## 2019-08-06 NOTE — Progress Notes (Signed)
[facility], [place] 08/06/19  Subjective:   LOS: 0 No intake/output data recorded. Patient known to our practice from outpatient dialysis.  She presents this time for chest discomfort.  She reports having to get up in the middle of the night.  She is on Protonix therefore she thinks it is not reflux. She reports tremors in both hands and arms, jerking movements of the head, She also reports decreased appetite Patient's last treatment as outpatient was on November 14 with post weight of 99.2 kg.  Today she is seen during dialysis.  Tolerating well.   HEMODIALYSIS FLOWSHEET:  Blood Flow Rate (mL/min): 400 mL/min Arterial Pressure (mmHg): -120 mmHg Venous Pressure (mmHg): 140 mmHg Transmembrane Pressure (mmHg): 60 mmHg Ultrafiltration Rate (mL/min): 100 mL/min Dialysate Flow Rate (mL/min): 600 ml/min Conductivity: Machine : 14 Conductivity: Machine : 14 Dialysis Fluid Bolus: Normal Saline Bolus Amount (mL): 250 mL    Objective:  Vital signs in last 24 hours:  Temp:  [97.2 F (36.2 C)-98.4 F (36.9 C)] 98.4 F (36.9 C) (11/17 1612) Pulse Rate:  [63-89] 89 (11/17 1612) Resp:  [14-25] 19 (11/17 1612) BP: (96-154)/(50-118) 154/70 (11/17 1612) SpO2:  [93 %-100 %] 100 % (11/17 1612) Weight:  [102.9 kg] 102.9 kg (11/16 2122)  Weight change:  Filed Weights   08/05/19 2122  Weight: 102.9 kg    Intake/Output:    Intake/Output Summary (Last 24 hours) at 08/06/2019 1728 Last data filed at 08/06/2019 1700 Gross per 24 hour  Intake 60 ml  Output 3000 ml  Net -2940 ml     Physical Exam: General:  Laying in the bed, no acute distress  HEENT  anicteric, moist oral mucous membranes  Pulm/lungs  mild basilar crackles, [place] O2  CVS/Heart  no rub or gallop  Abdomen:   Soft, nontender  Extremities:  1-2+ pitting edema  Neurologic:  Alert, oriented, speech normal, tremors in hands,  Skin:  No acute rashes  Access:  Left arm AV fistula        Basic Metabolic Panel:  Recent Labs  Lab 08/05/19 2328 08/06/19 0426  NA 139 138  K 4.1 4.7  CL 99 100  CO2 22 23  GLUCOSE 143* 139*  BUN 56* 57*  CREATININE 7.94* 8.02*  CALCIUM 7.9* 7.3*  PHOS  --  7.4*     CBC: Recent Labs  Lab 08/05/19 2136  WBC 6.5  HGB 9.1*  HCT 29.7*  MCV 103.8*  PLT 204      Lab Results  Component Value Date   HEPBSAG Negative 01/11/2018   HEPBSAB Reactive 01/11/2018      Microbiology:  Recent Results (from the past 240 hour(s))  SARS CORONAVIRUS 2 (TAT 6-24 HRS) Nasopharyngeal Nasopharyngeal Swab     Status: None   Collection Time: 08/06/19 12:26 AM   Specimen: Nasopharyngeal Swab  Result Value Ref Range Status   SARS Coronavirus 2 NEGATIVE NEGATIVE Final    Comment: (NOTE) SARS-CoV-2 target nucleic acids are NOT DETECTED. The SARS-CoV-2 RNA is generally detectable in upper and lower respiratory specimens during the acute phase of infection. Negative results do not preclude SARS-CoV-2 infection, do not rule out co-infections with other pathogens, and should not be used as the sole basis for treatment or other patient management decisions. Negative results must be combined with clinical observations, patient history, and epidemiological information. The expected result is Negative. Fact Sheet for Patients: SugarRoll.be Fact Sheet for Healthcare Providers: https://www.woods-mathews.com/ This test is not yet approved or cleared  by the [place] and  has been authorized for detection and/or diagnosis of SARS-CoV-2 by FDA under an Emergency Use Authorization (EUA). This EUA will remain  in effect (meaning this test can be used) for the duration of the COVID-19 declaration under Section 56 4(b)(1) of the Act, 21 U.S.C. section 360bbb-3(b)(1), unless the authorization is terminated or revoked sooner. Performed at [facility], [place] [address], [place], [place] [address]      Coagulation Studies: Recent Labs    08/06/19 0013  LABPROT 13.3  INR 1.0    Urinalysis: No results for input(s): COLORURINE, LABSPEC, PHURINE, GLUCOSEU, HGBUR, BILIRUBINUR, KETONESUR, PROTEINUR, UROBILINOGEN, NITRITE, LEUKOCYTESUR in the last 72 hours.  Invalid input(s): APPERANCEUR    Imaging: US Venous Img Lower Unilateral Right (dvt)  Result Date: 08/06/2019 CLINICAL DATA:  Right leg swelling for 1 month. EXAM: RIGHT LOWER EXTREMITY VENOUS DOPPLER ULTRASOUND TECHNIQUE: Gray-scale sonography with graded compression, as well as color Doppler and duplex ultrasound were performed to evaluate the lower extremity deep venous systems from the level of the common femoral vein and including the common femoral, femoral, profunda femoral, popliteal and calf veins including the posterior tibial, peroneal and gastrocnemius veins when visible. The superficial great saphenous vein was also interrogated. Spectral Doppler was utilized to evaluate flow at rest and with distal augmentation maneuvers in the common femoral, femoral and popliteal veins. COMPARISON:  None. FINDINGS: Contralateral Common Femoral Vein: Respiratory phasicity is normal and symmetric with the symptomatic side. No evidence of thrombus. Normal compressibility. Common Femoral Vein: No evidence of thrombus. Normal compressibility, respiratory phasicity and response to augmentation. Saphenofemoral Junction: No evidence of thrombus. Normal compressibility and flow on color Doppler imaging. Profunda Femoral Vein: No evidence of thrombus. Normal compressibility and flow on color Doppler imaging. Femoral Vein: No evidence of thrombus. Normal compressibility, respiratory phasicity and response to augmentation. Popliteal Vein: No evidence of thrombus. Normal compressibility, respiratory phasicity and response to augmentation. Calf Veins: No evidence of thrombus. Normal compressibility and flow on color Doppler imaging. Superficial Great  Saphenous Vein: No evidence of thrombus. Normal compressibility. Venous Reflux:  None. Other Findings: Complex fluid collection in the right popliteal fossa measuring 8.1 2.9 x 2.5 cm, likely ossified intra-articular bodies. IMPRESSION: 1. No evidence of right lower extremity DVT. 2. Complex fluid collection in the right popliteal fossa likely Baker cyst containing probable intra-articular bodies. Electronically Signed   By: Keith Rake M.D.   On: 08/06/2019 04:34   Dg Chest Portable 1 View  Result Date: 08/05/2019 CLINICAL DATA:  Left side chest pain EXAM: PORTABLE CHEST 1 VIEW COMPARISON:  07/06/2019 FINDINGS: Cardiomegaly with vascular congestion. Diffuse interstitial prominence likely reflects interstitial edema. No confluent opacities or effusions. Aortic atherosclerosis. No acute bony abnormality. IMPRESSION: Cardiomegaly, suspect mild interstitial edema. Electronically Signed   By: Rolm Baptise M.D.   On: 08/05/2019 21:56     Medications:   . heparin 1,000 Units/hr (08/06/19 0018)   . aspirin  81 mg Oral Daily  . atorvastatin  80 mg Oral QHS  . Chlorhexidine Gluconate Cloth  6 each Topical Q0600  . clopidogrel  75 mg Oral Daily  . fluticasone furoate-vilanterol  1 puff Inhalation Daily  . isosorbide mononitrate  30 mg Oral Daily  . metoprolol succinate  100 mg Oral Q breakfast  . ranolazine  500 mg Oral BID  . tiotropium  1 capsule Inhalation Daily  . torsemide  100 mg Oral Daily  . venlafaxine XR  37.5 mg Oral  Q breakfast   acetaminophen, albuterol, nitroGLYCERIN, ondansetron (ZOFRAN) IV  Assessment/ Plan:  68 y.o. female with stage renal disease on hemodialysis, coronary artery disease with history of CABG and PCI, peripheral arterial disease, history of right femoral-popliteal revascularization, COPD, hypertension, hyperlipidemia, diabetes type 2, history of stroke, history of anxiety and depression, obstructive sleep apnea admitted for evaluation of chest pain  Principal  Problem:   Chest pain Active Problems:   ESRD on dialysis ([place])   Benign essential hypertension   Coronary artery disease of native heart with stable angina pectoris (HCC)   Mixed hyperlipidemia   OSA on CPAP   Type 2 diabetes mellitus (HCC)   Anemia of chronic disease   Anxiety, generalized   COPD (chronic obstructive pulmonary disease) ([place])   #. ESRD with acute pulmonary edema [facility] Dialysis// TuThSa-1//TW//98kg// [facility] -Dialysis today with volume removal as tolerated Goal up to 3 L as tolerated  #. Anemia of CKD  Lab Results  Component Value Date   HGB 9.1 (L) 08/05/2019   Low dose EPO with HD  #. SHPTH     Component Value Date/Time   PTH 472 (H) 07/06/2019 1455   Lab Results  Component Value Date   PHOS 7.4 (H) 08/06/2019   Monitor calcium and phos level during this admission Resume Renvela 800 mg 3 times a day with meals once able to take a normal diet  #Chest pain -Cardiology evaluation ongoing for angina.  Patient recently had a positive stress test    LOS: 0 Cindel Daugherty 11/17/20205:28 PM  [facility] [place], [place]

## 2019-08-06 NOTE — H&P (Signed)
History and Physical    Kimberly Shaffer [id number] DOB: 1950-10-21 DOA: 08/05/2019  PCP: [facility], [facility]  Patient coming from: Home  I have personally briefly reviewed patient's old medical records in [place]  Chief Complaint: Chest pain  HPI: Kimberly Shaffer is a 68 y.o. female with medical history significant for severe three-vessel CAD s/p CABG and PCI, PAD s/p right femoral popliteal revascularization, ESRD on TTS HD, COPD, hypertension, hyperlipidemia, controlled type 2 diabetes, history of CVA, anxiety/depression, anemia of chronic disease, and OSA who presents to the ED for evaluation of chest pain.  Patient presenting after developing exertional left-sided chest pain and pressure radiating down her left arm on 08/05/2019 evening.  She had associated diaphoresis and nausea with emesis.  She reports having intermittent shortness of breath and occasional nonproductive cough as well.  She did not have any palpitations.  She did not lose consciousness.  She denies any subjective fevers or chills.  He denies any abdominal pain.  She says she still makes some urine.  She has noticed increased swelling in her right leg.  She reports adherence to dialysis with last session Saturday, 08/03/2019.  Patient was recently hospitalized from 1017-07/08/2019 for chest pain.  She was considered ruled out for ACS and discharged home with advice to follow-up with cardiology outpatient.  On review of Care Everywhere records she underwent myocardial perfusion stress test on 07/22/2019 which showed moderate in intensity and large in size inferolateral defect which was partially reversible.  ED Course:  Initial vitals showed BP 144/71, pulse 86, RR 16, temp 98.2 Fahrenheit, SPO2 93% on room air.  Labs notable for WBC 6.5, hemoglobin 9.1, MCV 103.8, platelets 204,000, sodium 139, potassium 4.1, bicarb 22, BUN 36, creatinine 0.94, serum glucose 143, high-sensitivity troponin 48.  SARS-CoV-2  test was obtained and pending.  Portable chest x-ray showed enlarged cardiac silhouette with vascular congestion.  EKG showed sinus rhythm with ST depressions in the inferolateral leads which were present on prior EKG but are more pronounced now.  Patient was given aspirin 324 mg, nitroglycerin paste, and started on IV heparin.  The hospitalist service was consulted admit for further evaluation and management.  Review of Systems: All systems reviewed and are negative except as documented in history of present illness above.   Past Medical History:  Diagnosis Date   Asthma    CAD (coronary artery disease)    Chronic lower back pain    COPD (chronic obstructive pulmonary disease) (HCC)    Depression    Diabetes mellitus without complication (HCC)    NIDD   H/O angina pectoris    H/O blood clots    Hypercholesteremia    Hypertension    Left rotator cuff tear    Myocardial infarction ([place])    Obesity    Renal insufficiency    Stroke [facility])    Vertigo, aural     Past Surgical History:  Procedure Laterality Date   A/V FISTULAGRAM Left 03/14/2017   Procedure: A/V Fistulagram;  Surgeon: Katha Cabal, MD;  Location: [place] [facility];  Service: Cardiovascular;  Laterality: Left;   AV FISTULA PLACEMENT Left 11-04-2015   AV FISTULA PLACEMENT Left 11/04/2015   Procedure: ARTERIOVENOUS (AV) FISTULA CREATION  ( BRACHIAL CEPHALIC );  Surgeon: Katha Cabal, MD;  Location: [facility];  Service: Vascular;  Laterality: Left;   CARDIAC CATHETERIZATION     CARDIAC CATHETERIZATION Left 07/19/2016   Procedure: Left Heart Cath and Coronary Angiography;  Surgeon:  Corey Skains, MD;  Location: [place] [facility];  Service: Cardiovascular;  Laterality: Left;   CORONARY ARTERY BYPASS GRAFT  2012   CORONARY STENT PLACEMENT     JOINT REPLACEMENT     KNEE SURGERY Bilateral    LEFT HEART CATH AND CORONARY ANGIOGRAPHY N/A 12/25/2017   Procedure: LEFT HEART CATH AND  CORONARY ANGIOGRAPHY;  Surgeon: Teodoro Spray, MD;  Location: [place] [facility];  Service: Cardiovascular;  Laterality: N/A;   LEFT HEART CATH AND CORONARY ANGIOGRAPHY Right 03/23/2018   Procedure: LEFT HEART CATH AND CORONARY ANGIOGRAPHY;  Surgeon: Dionisio David, MD;  Location: [place] [facility];  Service: Cardiovascular;  Laterality: Right;   LEFT HEART CATH AND CORONARY ANGIOGRAPHY N/A 05/30/2018   Procedure: LEFT HEART CATH AND CORONARY ANGIOGRAPHY;  Surgeon: Corey Skains, MD;  Location: [place] [facility];  Service: Cardiovascular;  Laterality: N/A;   PERIPHERAL VASCULAR CATHETERIZATION N/A 11/13/2015   Procedure: Dialysis/Perma Catheter Insertion;  Surgeon: Katha Cabal, MD;  Location: [place] [facility];  Service: Cardiovascular;  Laterality: N/A;   PERIPHERAL VASCULAR CATHETERIZATION N/A 12/04/2015   Procedure: Dialysis/Perma Catheter Insertion;  Surgeon: Katha Cabal, MD;  Location: [place] [facility];  Service: Cardiovascular;  Laterality: N/A;   PERIPHERAL VASCULAR CATHETERIZATION Left 12/31/2015   Procedure: A/V Shuntogram/Fistulagram;  Surgeon: Algernon Huxley, MD;  Location: [place] [facility];  Service: Cardiovascular;  Laterality: Left;   PERIPHERAL VASCULAR CATHETERIZATION N/A 12/31/2015   Procedure: A/V Shunt Intervention;  Surgeon: Algernon Huxley, MD;  Location: [place] [facility];  Service: Cardiovascular;  Laterality: N/A;   PERIPHERAL VASCULAR CATHETERIZATION Left 02/25/2016   Procedure: A/V Shuntogram/Fistulagram;  Surgeon: Algernon Huxley, MD;  Location: [place] [facility];  Service: Cardiovascular;  Laterality: Left;   PERIPHERAL VASCULAR CATHETERIZATION N/A 03/18/2016   Procedure: Dialysis/Perma Catheter Removal;  Surgeon: Katha Cabal, MD;  Location: [place] [facility];  Service: Cardiovascular;  Laterality: N/A;   REMOVAL OF A DIALYSIS CATHETER N/A 06/30/2017   Procedure: REMOVAL OF A DIALYSIS CATHETER;  Surgeon: Katha Cabal, MD;   Location: [facility];  Service: Vascular;  Laterality: N/A;   REPLACEMENT TOTAL KNEE BILATERAL Bilateral     Social History:  reports that she has been smoking cigarettes. She has been smoking about 0.25 packs per day. She has never used smokeless tobacco. She reports that she does not drink alcohol or use drugs.  Allergies  Allergen Reactions   Nystatin Hives and Itching   Sulfa Antibiotics Swelling, Hives and Rash   Niaspan [Niacin] Other (See Comments) and Hives    Family History  Problem Relation Age of Onset   Cancer Mother    Cancer Father    Diabetes Brother    Heart disease Brother      Prior to Admission medications   Medication Sig Start Date End Date Taking? Authorizing Provider  acetaminophen (TYLENOL) 325 MG tablet Take 2 tablets (650 mg total) by mouth every 6 (six) hours as needed for mild pain (or Fever >/= 101). 05/30/18   Awilda Bill, NP  albuterol (PROVENTIL) (2.5 MG/3ML) 0.083% nebulizer solution Take 3 mLs (2.5 mg total) by nebulization every 4 (four) hours as needed for wheezing or shortness of breath. 05/30/18   Awilda Bill, NP  albuterol (VENTOLIN HFA) 108 (90 Base) MCG/ACT inhaler Inhale 1 puff into the lungs every 6 (six) hours as needed for wheezing or shortness of breath.    [provider]  amLODipine (NORVASC) 10 MG tablet Take 10 mg by mouth daily.    [provider]  aspirin 81 MG chewable tablet Chew 81 mg by mouth daily.    [provider]  atorvastatin (LIPITOR) 80 MG tablet Take 80 mg by mouth at bedtime.  08/05/19   [provider]  budesonide-formoterol (SYMBICORT) 160-4.5 MCG/ACT inhaler Inhale 2 puffs into the lungs 2 (two) times daily.    [provider]  calcium carbonate (TUMS - DOSED IN MG ELEMENTAL CALCIUM) 500 MG chewable tablet Chew 2 tablets by mouth 3 (three) times daily.    [provider]  cetirizine (ZYRTEC) 10 MG tablet Take 10 mg by mouth daily.    [provider]  cinacalcet (SENSIPAR) 30 MG tablet Take 1 tablet (30 mg total) by mouth daily before breakfast. 05/31/18   Awilda Bill, NP  clopidogrel (PLAVIX) 75 MG tablet Take 1 tablet (75 mg total) by mouth daily. 05/31/18   Awilda Bill, NP  docusate sodium (COLACE) 100 MG capsule Take 1 capsule (100 mg total) by mouth 2 (two) times daily. 05/30/18   Awilda Bill, NP  fluticasone (FLONASE) 50 MCG/ACT nasal spray Place 2 sprays into both nostrils 2 (two) times daily.    [provider]  fluticasone furoate-vilanterol (BREO ELLIPTA) 200-25 MCG/INH AEPB Inhale 1 puff into the lungs daily. 05/31/18   Awilda Bill, NP  folic acid (FOLVITE) 1 MG tablet Take 1 mg by mouth daily.    [provider]  isosorbide mononitrate (IMDUR) 30 MG 24 hr tablet Take 30 mg by mouth daily.    [provider]  lidocaine-prilocaine (EMLA) cream Apply 1 application topically as needed (topical anesthesia for hemodialysis if Gebauers and Lidocaine injection are ineffective.). 05/30/18   Awilda Bill, NP  lubiprostone (AMITIZA) 24 MCG capsule Take 24 mcg by mouth 2 (two) times daily with a meal.    [provider]  meclizine (ANTIVERT) 12.5 MG tablet Take 12.5 mg by mouth 3 (three) times daily as needed for dizziness.     [provider]  metoprolol succinate (TOPROL-XL) 100 MG 24 hr tablet Take 1 tablet (100 mg total) by mouth daily with breakfast. Take with or immediately following a meal. 05/31/18   Awilda Bill, NP  mirtazapine (REMERON) 7.5 MG tablet Take 7.5 mg by mouth at bedtime. 07/31/19   [provider]  multivitamin (RENA-VIT) TABS tablet Take 1 tablet by mouth at bedtime. 05/30/18   Awilda Bill, NP  ondansetron (ZOFRAN) 4 MG tablet Take 1 tablet (4 mg total) by mouth every 6 (six) hours as needed for nausea. 05/30/18   Awilda Bill, NP  Oxycodone HCl 10 MG TABS Take 10 mg by mouth every 6 (six) hours as needed (pain).    [provider]  pantoprazole (PROTONIX) 40 MG tablet Take 40 mg by mouth 2 (two) times daily.    [provider]  polyethylene glycol (MIRALAX / GLYCOLAX) packet Take 17 g by mouth daily. 05/31/18   Awilda Bill, NP  ranolazine (RANEXA) 500 MG 12 hr tablet Take 500 mg by mouth 2 (two) times daily. 04/13/18   [provider]  sevelamer carbonate (RENVELA) 800 MG tablet Take 2,400 mg by mouth 3 (three) times daily with meals.     [provider]  tiotropium (SPIRIVA HANDIHALER) 18 MCG inhalation capsule Place 1 capsule (18 mcg total) into inhaler and inhale daily. 05/31/18   Blakeney,  Dreama Saa, NP  torsemide (DEMADEX) 100 MG tablet Take 100 mg by mouth daily.    [provider]  venlafaxine XR (EFFEXOR-XR) 37.5 MG 24 hr capsule Take 37.5 mg by mouth daily with breakfast.    [provider]    Physical Exam: Vitals:   08/05/19 2124 08/05/19 2238 08/05/19 2328 08/06/19 0021  BP: (!) 144/71 131/65 (!) 144/118 (!) 103/54  Pulse: 88 89 79 75  Resp: 16 (!) 23 18 16   Temp: 98.2 F (36.8 C)     TempSrc: Oral     SpO2: 93% 98% 93% 94%  Weight:      Height:        Constitutional: Chronically ill-appearing elderly obese woman resting in the left lateral decubitus position in bed, sleepy but in NAD, calm, comfortable Eyes: PERRL, lids and conjunctivae normal ENMT: Mucous membranes are moist. Posterior pharynx clear of any exudate or lesions.Normal dentition.  Neck: normal, supple, no masses. Respiratory: Inspiratory crackles bilaterally. Normal respiratory effort. No accessory muscle use.  Cardiovascular: Regular rate and rhythm, soft systolic murmur.  Right greater than left pitting edema.  aVF LUE with palpable thrill. Abdomen: no tenderness, no masses palpated. No hepatosplenomegaly. Bowel sounds positive.  Musculoskeletal: no clubbing / cyanosis. No joint deformity upper and lower extremities. Good ROM, no contractures. Normal muscle tone.  Skin: no  rashes, lesions, ulcers. No induration Neurologic: CN 2-12 grossly intact. Sensation intact, Strength 5/5 in all 4.  Psychiatric: Normal judgment and insight. Alert and oriented x 3. Normal mood.   Labs on Admission: I have personally reviewed following labs and imaging studies  CBC: Recent Labs  Lab 08/05/19 2136  WBC 6.5  HGB 9.1*  HCT 29.7*  MCV 103.8*  PLT 0000000   Basic Metabolic Panel: Recent Labs  Lab 08/05/19 2328  NA 139  K 4.1  CL 99  CO2 22  GLUCOSE 143*  BUN 56*  CREATININE 7.94*  CALCIUM 7.9*   GFR: Estimated Creatinine Clearance: 8.2 mL/min (A) (by C-G formula based on SCr of 7.94 mg/dL (H)). Liver Function Tests: No results for input(s): AST, ALT, ALKPHOS, BILITOT, PROT, ALBUMIN in the last 168 hours. No results for input(s): LIPASE, AMYLASE in the last 168 hours. No results for input(s): AMMONIA in the last 168 hours. Coagulation Profile: No results for input(s): INR, PROTIME in the last 168 hours. Cardiac Enzymes: No results for input(s): CKTOTAL, CKMB, CKMBINDEX, TROPONINI in the last 168 hours. BNP (last 3 results) No results for input(s): PROBNP in the last 8760 hours. HbA1C: No results for input(s): HGBA1C in the last 72 hours. CBG: No results for input(s): GLUCAP in the last 168 hours. Lipid Profile: No results for input(s): CHOL, HDL, LDLCALC, TRIG, CHOLHDL, LDLDIRECT in the last 72 hours. Thyroid Function Tests: No results for input(s): TSH, T4TOTAL, FREET4, T3FREE, THYROIDAB in the last 72 hours. Anemia Panel: No results for input(s): VITAMINB12, FOLATE, FERRITIN, TIBC, IRON, RETICCTPCT in the last 72 hours. Urine analysis:    Component Value Date/Time   COLORURINE YELLOW (A) 06/19/2018 0824   APPEARANCEUR CLEAR (A) 06/19/2018 0824   APPEARANCEUR Clear 07/21/2013 0807   LABSPEC 1.011 06/19/2018 0824   LABSPEC 1.013 07/21/2013 0807   PHURINE 7.0 06/19/2018 0824   GLUCOSEU 150 (A) 06/19/2018 0824   GLUCOSEU Negative 07/21/2013 0807    HGBUR NEGATIVE 06/19/2018 0824   BILIRUBINUR NEGATIVE 06/19/2018 0824   BILIRUBINUR Negative 07/21/2013 [place] 06/19/2018 0824   PROTEINUR 100 (A) 06/19/2018 [id number]  NITRITE NEGATIVE 06/19/2018 0824   LEUKOCYTESUR NEGATIVE 06/19/2018 0824   LEUKOCYTESUR Negative 07/21/2013 0807    Radiological Exams on Admission: Dg Chest Portable 1 View  Result Date: 08/05/2019 CLINICAL DATA:  Left side chest pain EXAM: PORTABLE CHEST 1 VIEW COMPARISON:  07/06/2019 FINDINGS: Cardiomegaly with vascular congestion. Diffuse interstitial prominence likely reflects interstitial edema. No confluent opacities or effusions. Aortic atherosclerosis. No acute bony abnormality. IMPRESSION: Cardiomegaly, suspect mild interstitial edema. Electronically Signed   By: Rolm Baptise M.D.   On: 08/05/2019 21:56    EKG: Independently reviewed. Sinus rhythm with ST depression inferiolateral leads, when compared to prior ST depressions are more pronounced.  Assessment/Plan Active Problems:   ESRD on dialysis ([place])   Benign essential hypertension   Coronary artery disease of native artery of native heart with stable angina pectoris (HCC)   Mixed hyperlipidemia   OSA on CPAP   Type 2 diabetes mellitus (HCC)   Anemia of chronic disease   Anxiety, generalized   COPD (chronic obstructive pulmonary disease) ([place])   Chest pain  Kimberly Shaffer is a 68 y.o. female with medical history significant for severe three-vessel CAD s/p CABG and PCI, PAD s/p right femoral popliteal revascularization, ESRD on TTS HD, COPD, hypertension, hyperlipidemia, controlled type 2 diabetes, history of CVA, anxiety/depression, anemia of chronic disease, and OSA who is admitted for evaluation of chest pain.   Chest pain with severe three-vessel CAD s/p CABG and PCI: Symptoms suggestive of stable angina.  She was started on IV heparin drip in the ED.  Troponin slightly elevated at 48.  EKG with more pronounced ST depressions in the  inferolateral leads compared to prior.  She is chest pain-free at time of admission.  Echo at [facility] 06/21/2019 showed EF >55%.  Myocardial stress test at [facility] 07/22/2019 showed moderate intensity and large in size inferolateral defect that was partially reversible. -Monitor on telemetry -Will continue heparin for now -Continue aspirin, Plavix, Imdur, Toprol-XL, Ranexa, atorvastatin -Cycle cardiac enzymes -A.m. cardiology consultation requested  ESRD on TTS HD: Has slight interstitial edema on chest x-ray.  We will continue home torsemide and request a.m. nephrology consultation for usual dialysis needs.  COPD: Chronic and stable.  Continue Breo, Spiriva, and as needed albuterol.  Continue supplemental O2 as needed.  PAD s/p right femoral popliteal revascularization 06/17/2019: Reports increased right lower extremity swelling without significant pain.  Will obtain RLE ultrasound to assess for DVT.  Hypertension: Continue Toprol-XL, Imdur, torsemide as BP tolerates.  Hyperlipidemia: Continue atorvastatin.  Anemia chronic disease: Chronic and stable relative to recent labs.  Continue monitor while on heparin and antiplatelets.  Anxiety/depression: Continue venlafaxine.  OSA: States she has CPAP equipment at home but does not know how to set it up.  She has been using 2 L supplemental O2 via [place] at night.   DVT prophylaxis: Heparin gtt Code Status: DNR, confirmed with patient Family Communication: Discussed with patient, she states family is aware Disposition Plan: Pending clinical progress Consults called: A.m. cardiology and nephrology consults placed Admission status: Observation   Zada Finders MD [facility]  If 7PM-7AM, please contact night-coverage [email or web address]  08/06/2019, 12:24 AM

## 2019-08-06 NOTE — ED Notes (Signed)
Dr Karma Greaser made aware at this time of pt's elevated Troponin level as reported by lab. Troponin 75ng/L

## 2019-08-06 NOTE — Progress Notes (Signed)
   08/06/19 0945  Neurological  Level of Consciousness Alert  Orientation Level Oriented X4  Respiratory  Respiratory Pattern Regular;Unlabored  Bilateral Breath Sounds Clear  Cardiac  ECG Monitor Yes  STABLE FOR HD TX AVF+/+UFG 1.6ML

## 2019-08-06 NOTE — ED Notes (Signed)
Pt up to use bedisde comode. Given water and repositioned

## 2019-08-06 NOTE — ED Notes (Signed)
Patient had episode of emesis at this time 

## 2019-08-06 NOTE — Progress Notes (Signed)
This note also relates to the following rows which could not be included: Pulse Rate - Cannot attach notes to unvalidated device data Resp - Cannot attach notes to unvalidated device data BP - Cannot attach notes to unvalidated device data SpO2 - Cannot attach notes to unvalidated device data    08/06/19 1345  Vital Signs  Pulse Rate Source Monitor  TX COMPLETE PT TOLERATED WELL NO DISTRESS NOTED AVF +/+ UFG 3L

## 2019-08-06 NOTE — ED Notes (Signed)
Patient given orange juice and graham crackers.

## 2019-08-06 NOTE — Consult Note (Signed)
[facility] Cardiology  CARDIOLOGY CONSULT NOTE  Patient ID: Kimberly Shaffer MRN: [id number] DOB/AGE: Jun 12, 1951 68 y.o.  Admit date: 08/05/2019 Referring Physician Zada Finders, MD Primary Physician [facility] outpatient clinic Primary Cardiologist Allie Bossier, MD Reason for Consultation Chest pain  HPI: 68 year old female referred for evaluation of chest pain. The patient has a history of coronary artery disease, status post multiple coronary stents, followed by CABG x 1 (LIMA to LAD) in 2012 at [facility], ESRD on dialysis, COPD, hypertension, hyperlipidemia, type II diabetes, diastolic heart failure, history of stroke in 2001, PVD status post right femoral popliteal revascularization, bilateral carotid artery stenosis, and OSA. Cardiac catheterization on 03/22/18 in the setting of an acute NSTEMI revealed 3-vessel tortuous CAD. She was transferred to [facility] for consideration of redo CABG, underwent difficult, but successul PCI to mid LAD. She did not proceed with CABG. She underwent repeat cardiac catheterization 05/30/2018 in setting of NSTEMI, which revealed severe 3 vessel disease. She was transferred to [facility] where the 80% distal LAD lesion was treated with POBA, and a stent was placed in the proximal LAD.   Kimberly Shaffer, the patient experienced left sided pulsating chest tightness with radiation to her left arm with associated shortness of breath and nausea yesterday morning after she pulled herself up into her truck when leaving the bank. The chest pain persisted for several hours, and slowly resolved, only to return later than evening. She took 2 isosorbide mononitrate tablets, as she states she did not have sublingual nitroglycerin, and does not take isosorbide mononitrate daily due to headaches. She states that she does take Ranexa daily. She presented to [facility] ER via ambulance where ECG revealed sinus rhythm at a rate of 90 bpm with inferolateral ST depression, similar to previous ECGs. High sensitivity troponin  borderline elevated to 20, 48, 75, and 89. The patient was started on heparin drip. She has had no recurrent chest pain. Of note, the patient was recently admitted from 10/17-10/19 for chest pain, ruled out for ACS, and advised to follow-up with her cardiologist as outpatient. She underwent a Lexiscan Myoview on 07/22/2019 revealed LVEF 45% with moderate in intensity and large in size inferolateral defect that is partially reversible. 2D echocardiogram on 06/21/2019 revealed normal left ventricular function with LVEF greater than 55%, moderate LVH, diastolic dysfunction, and trivial valvular insufficiencies.   Review of systems complete and found to be negative unless listed above     Past Medical History:  Diagnosis Date  . Asthma   . CAD (coronary artery disease)   . Chronic lower back pain   . COPD (chronic obstructive pulmonary disease) ([place])   . Depression   . Diabetes mellitus without complication (HCC)    NIDD  . H/O angina pectoris   . H/O blood clots   . Hypercholesteremia   . Hypertension   . Left rotator cuff tear   . Myocardial infarction ([place])   . Obesity   . Renal insufficiency   . Stroke ([place])   . Vertigo, aural     Past Surgical History:  Procedure Laterality Date  . A/V FISTULAGRAM Left 03/14/2017   Procedure: A/V Fistulagram;  Surgeon: Katha Cabal, MD;  Location: [place] CV LAB;  Service: Cardiovascular;  Laterality: Left;  . AV FISTULA PLACEMENT Left 11-04-2015  . AV FISTULA PLACEMENT Left 11/04/2015   Procedure: ARTERIOVENOUS (AV) FISTULA CREATION  ( BRACHIAL CEPHALIC );  Surgeon: Katha Cabal, MD;  Location: [facility] ORS;  Service: Vascular;  Laterality: Left;  . CARDIAC CATHETERIZATION    .  CARDIAC CATHETERIZATION Left 07/19/2016   Procedure: Left Heart Cath and Coronary Angiography;  Surgeon: Corey Skains, MD;  Location: [place] CV LAB;  Service: Cardiovascular;  Laterality: Left;  . CORONARY ARTERY BYPASS GRAFT  2012  . CORONARY STENT  PLACEMENT    . JOINT REPLACEMENT    . KNEE SURGERY Bilateral   . LEFT HEART CATH AND CORONARY ANGIOGRAPHY N/A 12/25/2017   Procedure: LEFT HEART CATH AND CORONARY ANGIOGRAPHY;  Surgeon: Teodoro Spray, MD;  Location: [place] CV LAB;  Service: Cardiovascular;  Laterality: N/A;  . LEFT HEART CATH AND CORONARY ANGIOGRAPHY Right 03/23/2018   Procedure: LEFT HEART CATH AND CORONARY ANGIOGRAPHY;  Surgeon: Dionisio David, MD;  Location: [place] CV LAB;  Service: Cardiovascular;  Laterality: Right;  . LEFT HEART CATH AND CORONARY ANGIOGRAPHY N/A 05/30/2018   Procedure: LEFT HEART CATH AND CORONARY ANGIOGRAPHY;  Surgeon: Corey Skains, MD;  Location: [place] CV LAB;  Service: Cardiovascular;  Laterality: N/A;  . PERIPHERAL VASCULAR CATHETERIZATION N/A 11/13/2015   Procedure: Dialysis/Perma Catheter Insertion;  Surgeon: Katha Cabal, MD;  Location: [place] [facility];  Service: Cardiovascular;  Laterality: N/A;  . PERIPHERAL VASCULAR CATHETERIZATION N/A 12/04/2015   Procedure: Dialysis/Perma Catheter Insertion;  Surgeon: Katha Cabal, MD;  Location: [place] CV LAB;  Service: Cardiovascular;  Laterality: N/A;  . PERIPHERAL VASCULAR CATHETERIZATION Left 12/31/2015   Procedure: A/V Shuntogram/Fistulagram;  Surgeon: Algernon Huxley, MD;  Location: [place] CV LAB;  Service: Cardiovascular;  Laterality: Left;  . PERIPHERAL VASCULAR CATHETERIZATION N/A 12/31/2015   Procedure: A/V Shunt Intervention;  Surgeon: Algernon Huxley, MD;  Location: [place] CV LAB;  Service: Cardiovascular;  Laterality: N/A;  . PERIPHERAL VASCULAR CATHETERIZATION Left 02/25/2016   Procedure: A/V Shuntogram/Fistulagram;  Surgeon: Algernon Huxley, MD;  Location: [place] CV LAB;  Service: Cardiovascular;  Laterality: Left;  . PERIPHERAL VASCULAR CATHETERIZATION N/A 03/18/2016   Procedure: Dialysis/Perma Catheter Removal;  Surgeon: Katha Cabal, MD;  Location: [place] CV LAB;  Service:  Cardiovascular;  Laterality: N/A;  . REMOVAL OF A DIALYSIS CATHETER N/A 06/30/2017   Procedure: REMOVAL OF A DIALYSIS CATHETER;  Surgeon: Katha Cabal, MD;  Location: [facility] ORS;  Service: Vascular;  Laterality: N/A;  . REPLACEMENT TOTAL KNEE BILATERAL Bilateral     Medications Prior to Admission  Medication Sig Dispense Refill Last Dose  . acetaminophen (TYLENOL) 325 MG tablet Take 2 tablets (650 mg total) by mouth every 6 (six) hours as needed for mild pain (or Fever >/= 101).   Unknown at PRN  . albuterol (PROVENTIL) (2.5 MG/3ML) 0.083% nebulizer solution Take 3 mLs (2.5 mg total) by nebulization every 4 (four) hours as needed for wheezing or shortness of breath. 75 mL 12 Unknown at PRN  . albuterol (VENTOLIN HFA) 108 (90 Base) MCG/ACT inhaler Inhale 1 puff into the lungs every 6 (six) hours as needed for wheezing or shortness of breath.   Unknown at PRN  . amLODipine (NORVASC) 10 MG tablet Take 10 mg by mouth daily.   Past Week at Unknown time  . aspirin 81 MG chewable tablet Chew 81 mg by mouth daily.   Past Week at Unknown time  . atorvastatin (LIPITOR) 80 MG tablet Take 80 mg by mouth at bedtime.    Past Week at Unknown time  . calcium carbonate (TUMS - DOSED IN MG ELEMENTAL CALCIUM) 500 MG chewable tablet Chew 2 tablets by mouth 3 (three) times daily.   Unknown  at Unknown  . cetirizine (ZYRTEC) 10 MG tablet Take 10 mg by mouth daily.   Past Week at Unknown time  . cinacalcet (SENSIPAR) 30 MG tablet Take 1 tablet (30 mg total) by mouth daily before breakfast. 60 tablet  Unknown at Unknown  . clopidogrel (PLAVIX) 75 MG tablet Take 1 tablet (75 mg total) by mouth daily.   Past Week at Unknown time  . fluticasone (FLONASE) 50 MCG/ACT nasal spray Place 2 sprays into both nostrils 2 (two) times daily.   Unknown at Unknown  . fluticasone furoate-vilanterol (BREO ELLIPTA) 200-25 MCG/INH AEPB Inhale 1 puff into the lungs daily.   Past Week at Unknown time  . folic acid (FOLVITE) 1 MG tablet  Take 1 mg by mouth daily.   Unknown at Unknown  . isosorbide mononitrate (IMDUR) 30 MG 24 hr tablet Take 30 mg by mouth daily.   Past Week at Unknown time  . lidocaine-prilocaine (EMLA) cream Apply 1 application topically as needed (topical anesthesia for hemodialysis if Gebauers and Lidocaine injection are ineffective.). 30 g 0 As directed at As directed  . meclizine (ANTIVERT) 12.5 MG tablet Take 12.5 mg by mouth 3 (three) times daily as needed for dizziness.    Unknown at PRN  . metoprolol succinate (TOPROL-XL) 100 MG 24 hr tablet Take 1 tablet (100 mg total) by mouth daily with breakfast. Take with or immediately following a meal.   08/05/2019 at Unknown time  . mirtazapine (REMERON) 7.5 MG tablet Take 7.5 mg by mouth at bedtime.   Past Week at Unknown time  . multivitamin (RENA-VIT) TABS tablet Take 1 tablet by mouth at bedtime.  0 Past Week at Unknown time  . Oxycodone HCl 10 MG TABS Take 10 mg by mouth See admin instructions. Take 1 tablet (10mg ) by mouth 4 to 5 times daily as needed for severe pain   Past Week at PRN  . pantoprazole (PROTONIX) 40 MG tablet Take 40 mg by mouth 2 (two) times daily.   Past Week at Unknown time  . sevelamer carbonate (RENVELA) 800 MG tablet Take 2,400 mg by mouth 3 (three) times daily with meals.    Unknown at Unknown  . torsemide (DEMADEX) 100 MG tablet Take 100 mg by mouth daily.   Past Week at Unknown time  . docusate sodium (COLACE) 100 MG capsule Take 1 capsule (100 mg total) by mouth 2 (two) times daily. (Patient not taking: Reported on 08/06/2019) 10 capsule 0 Not Taking at Unknown time  . ondansetron (ZOFRAN) 4 MG tablet Take 1 tablet (4 mg total) by mouth every 6 (six) hours as needed for nausea. (Patient not taking: Reported on 08/06/2019) 20 tablet 0 Not Taking at Unknown time  . polyethylene glycol (MIRALAX / GLYCOLAX) packet Take 17 g by mouth daily. (Patient not taking: Reported on 08/06/2019) 14 each 0 Not Taking at Unknown time  . tiotropium  (SPIRIVA HANDIHALER) 18 MCG inhalation capsule Place 1 capsule (18 mcg total) into inhaler and inhale daily. (Patient not taking: Reported on 08/06/2019) 30 capsule 12 Not Taking at Unknown time   Social History   Socioeconomic History  . Marital status: Widowed    Spouse name: Not on file  . Number of children: Not on file  . Years of education: Not on file  . Highest education level: Not on file  Occupational History  . Occupation: retired  Scientific laboratory technician  . Financial resource strain: Not on file  . Food insecurity    Worry: Not  on file    Inability: Not on file  . Transportation needs    Medical: Not on file    Non-medical: Not on file  Tobacco Use  . Smoking status: Current Every Day Smoker    Packs/day: 0.25    Types: Cigarettes  . Smokeless tobacco: Never Used  Substance and Sexual Activity  . Alcohol use: No    Alcohol/week: 0.0 standard drinks  . Drug use: No  . Sexual activity: Yes  Lifestyle  . Physical activity    Days per week: Not on file    Minutes per session: Not on file  . Stress: Not on file  Relationships  . Social Herbalist on phone: Not on file    Gets together: Not on file    Attends religious service: Not on file    Active member of club or organization: Not on file    Attends meetings of clubs or organizations: Not on file    Relationship status: Not on file  . Intimate partner violence    Fear of current or ex partner: Not on file    Emotionally abused: Not on file    Physically abused: Not on file    Forced sexual activity: Not on file  Other Topics Concern  . Not on file  Social History Narrative  . Not on file    Family History  Problem Relation Age of Onset  . Cancer Mother   . Cancer Father   . Diabetes Brother   . Heart disease Brother       Review of systems complete and found to be negative unless listed above      PHYSICAL EXAM  General: Well developed, well nourished, in no acute distress, sitting up in  bed drinking coffee. HEENT:  Normocephalic and atramatic Neck:  No JVD.  Lungs: normal effort of breathing on supplemental oxygen via nasal cannula, slight bibasilar crackles Heart: HRRR . Normal S1 and S2 without gallops or murmurs.  Abdomen: no obvious distention Msk:  Back normal, gait not assessed. Normal strength and tone for age. Extremities: No clubbing, cyanosis or edema.   Neuro: Alert and oriented X 3. Psych:  Good affect, responds appropriately  Labs:   Lab Results  Component Value Date   WBC 6.5 08/05/2019   HGB 9.1 (L) 08/05/2019   HCT 29.7 (L) 08/05/2019   MCV 103.8 (H) 08/05/2019   PLT 204 08/05/2019    Recent Labs  Lab 08/06/19 0426  NA 138  K 4.7  CL 100  CO2 23  BUN 57*  CREATININE 8.02*  CALCIUM 7.3*  GLUCOSE 139*   Lab Results  Component Value Date   CKTOTAL 164 04/08/2014   CKMB 2.4 04/08/2014   TROPONINI 0.03 (HH) 11/27/2018    Lab Results  Component Value Date   CHOL 137 07/06/2019   CHOL 132 03/23/2018   CHOL 118 12/17/2015   Lab Results  Component Value Date   HDL 41 07/06/2019   HDL 40 (L) 03/23/2018   HDL 24 (L) 12/17/2015   Lab Results  Component Value Date   LDLCALC 73 07/06/2019   LDLCALC 73 03/23/2018   LDLCALC 66 12/17/2015   Lab Results  Component Value Date   TRIG 115 07/06/2019   TRIG 94 03/23/2018   TRIG 138 12/17/2015   Lab Results  Component Value Date   CHOLHDL 3.3 07/06/2019   CHOLHDL 3.3 03/23/2018   CHOLHDL 4.9 12/17/2015   No results found for:  LDLDIRECT    Radiology: Ct Angio Chest Pe W Or Wo Contrast  Result Date: 07/07/2019 CLINICAL DATA:  68 year old female with high probability of pulmonary embolism. Chest pain with increasing shortness of breath on exertion. EXAM: CT ANGIOGRAPHY CHEST WITH CONTRAST TECHNIQUE: Multidetector CT imaging of the chest was performed using the standard protocol during bolus administration of intravenous contrast. Multiplanar CT image reconstructions and MIPs were  obtained to evaluate the vascular anatomy. CONTRAST:  48mL OMNIPAQUE IOHEXOL 350 MG/ML SOLN COMPARISON:  Chest CTA 12/17/2015. FINDINGS: Cardiovascular: No filling defects within the pulmonary arterial tree to suggest underlying pulmonary embolism. Heart size is enlarged with left atrial and left ventricular dilatation. There is no significant pericardial fluid, thickening or pericardial calcification. There is aortic atherosclerosis, as well as atherosclerosis of the great vessels of the mediastinum and the coronary arteries, including calcified atherosclerotic plaque in the left main, left anterior descending, left circumflex and right coronary arteries. Thickening calcification of the aortic valve. Dilatation of the pulmonic trunk (3.6 cm in diameter). Mediastinum/Nodes: No pathologically enlarged mediastinal or hilar lymph nodes. Esophagus is unremarkable in appearance. No axillary lymphadenopathy. Lungs/Pleura: Small pulmonary nodules in the right upper lobe measuring up to 5 mm (axial image 40 of series 6). No other larger more suspicious appearing pulmonary nodules or masses are noted. No acute consolidative airspace disease. No pleural effusions. Upper Abdomen: 1.3 cm exophytic low-attenuation lesion in the anterior aspect of the upper pole the right kidney, compatible with a simple cyst. Aortic atherosclerosis. Musculoskeletal: Status post resection of the anterior aspect of the left fifth rib. There are no aggressive appearing lytic or blastic lesions noted in the visualized portions of the skeleton. Review of the MIP images confirms the above findings. IMPRESSION: 1. No evidence of pulmonary embolism. 2. No acute findings are noted in the thorax to account for the patient's symptoms. 3. Aortic atherosclerosis, in addition to left main and 3 vessel coronary artery disease. Please note that although the presence of coronary artery calcium documents the presence of coronary artery disease, the severity of  this disease and any potential stenosis cannot be assessed on this non-gated CT examination. Assessment for potential risk factor modification, dietary therapy or pharmacologic therapy may be warranted, if clinically indicated. 4. Dilatation of the pulmonic trunk (3.6 cm in diameter), concerning for pulmonary arterial hypertension. 5. Cardiomegaly with left atrial and left ventricular dilatation. Electronically Signed   By: Vinnie Langton M.D.   On: 07/07/2019 12:36   US Venous Img Lower Unilateral Right (dvt)  Result Date: 08/06/2019 CLINICAL DATA:  Right leg swelling for 1 month. EXAM: RIGHT LOWER EXTREMITY VENOUS DOPPLER ULTRASOUND TECHNIQUE: Gray-scale sonography with graded compression, as well as color Doppler and duplex ultrasound were performed to evaluate the lower extremity deep venous systems from the level of the common femoral vein and including the common femoral, femoral, profunda femoral, popliteal and calf veins including the posterior tibial, peroneal and gastrocnemius veins when visible. The superficial great saphenous vein was also interrogated. Spectral Doppler was utilized to evaluate flow at rest and with distal augmentation maneuvers in the common femoral, femoral and popliteal veins. COMPARISON:  None. FINDINGS: Contralateral Common Femoral Vein: Respiratory phasicity is normal and symmetric with the symptomatic side. No evidence of thrombus. Normal compressibility. Common Femoral Vein: No evidence of thrombus. Normal compressibility, respiratory phasicity and response to augmentation. Saphenofemoral Junction: No evidence of thrombus. Normal compressibility and flow on color Doppler imaging. Profunda Femoral Vein: No evidence of thrombus. Normal compressibility and flow  on color Doppler imaging. Femoral Vein: No evidence of thrombus. Normal compressibility, respiratory phasicity and response to augmentation. Popliteal Vein: No evidence of thrombus. Normal compressibility, respiratory  phasicity and response to augmentation. Calf Veins: No evidence of thrombus. Normal compressibility and flow on color Doppler imaging. Superficial Great Saphenous Vein: No evidence of thrombus. Normal compressibility. Venous Reflux:  None. Other Findings: Complex fluid collection in the right popliteal fossa measuring 8.1 2.9 x 2.5 cm, likely ossified intra-articular bodies. IMPRESSION: 1. No evidence of right lower extremity DVT. 2. Complex fluid collection in the right popliteal fossa likely Baker cyst containing probable intra-articular bodies. Electronically Signed   By: Keith Rake M.D.   On: 08/06/2019 04:34   Dg Chest Portable 1 View  Result Date: 08/05/2019 CLINICAL DATA:  Left side chest pain EXAM: PORTABLE CHEST 1 VIEW COMPARISON:  07/06/2019 FINDINGS: Cardiomegaly with vascular congestion. Diffuse interstitial prominence likely reflects interstitial edema. No confluent opacities or effusions. Aortic atherosclerosis. No acute bony abnormality. IMPRESSION: Cardiomegaly, suspect mild interstitial edema. Electronically Signed   By: Rolm Baptise M.D.   On: 08/05/2019 21:56    EKG: sinus rhythm, ST depression inferolateral leads, similar to previous ECGs  ASSESSMENT AND PLAN:  1. Chest pain, in a patient with known extensive three vessel coronary artery disease with multiple coronary stents, CABG x 1 in 2012, multiple cardiac catheterizations, most recently in 05/2018 at [facility], who has been considered for repeat CABG, but patient declined. Troponin borderline elevated to 20, 48, 75, and 89. ECG with inferolateral ST depression, though overall unchanged from previous. She is on heparin drip, and denies recurrent chest pain since yesterday. The patient reports that she does not take isosorbide mononitrate daily due to headaches, and takes it on PRN as she did not have a prescription for SL nitroglycerin. Patient underwent Lexiscan Myoview 07/22/2019 which revealed moderate in intensity large  partially reversible inferolateral defect. She also had recent 2D echocardiogram which revealed LVEF greater than 55%, moderate LVH, diastolic dysfunction, and trivial valvular insufficiencies.  2. ESRD on dialysis, undergoing hemodialysis today  Recommendations: 1. Continue heparin drip for 24-48 hours. 2. Defer cardiac catheterization as patient recently underwent one 05/2018, with severely tortuous CAD, has been considered for redo CABG versus continued medical management. 3. Continue isosorbide mononitrate, and encourage patient to continue once daily use as outpatient, Ranexa, metoprolol succinate, aspirin, Plavix, high intensity atorvastatin, and torsemide. 4. Recommend following up with her cardiologist in 1 week after discharge.  Signed: Clabe Seal PA-C 08/06/2019, 11:10 AM  Discussed with Dr. Saralyn Pilar who agrees with the above plan.

## 2019-08-07 ENCOUNTER — Other Ambulatory Visit: Payer: Self-pay

## 2019-08-07 DIAGNOSIS — I200 Unstable angina: Secondary | ICD-10-CM

## 2019-08-07 DIAGNOSIS — D638 Anemia in other chronic diseases classified elsewhere: Secondary | ICD-10-CM

## 2019-08-07 LAB — TROPONIN I (HIGH SENSITIVITY)
Troponin I (High Sensitivity): 149 ng/L (ref <18)
Troponin I (High Sensitivity): 150 ng/L (ref <18)
Troponin I (High Sensitivity): 112 ng/L (ref <18)

## 2019-08-07 LAB — GLUCOSE, CAPILLARY
Glucose-Capillary: 96 mg/dL (ref 70–99)
Glucose-Capillary: 87 mg/dL (ref 70–99)

## 2019-08-07 LAB — CBC
HCT: 28.6 % — ABNORMAL LOW (ref 36.0–46.0)
Hemoglobin: 9.1 g/dL — ABNORMAL LOW (ref 12.0–15.0)
MCH: 31.8 pg (ref 26.0–34.0)
MCHC: 31.8 g/dL (ref 30.0–36.0)
MCV: 100 fL (ref 80.0–100.0)
Platelets: 186 10*3/uL (ref 150–400)
RBC: 2.86 MIL/uL — ABNORMAL LOW (ref 3.87–5.11)
RDW: 14.5 % (ref 11.5–15.5)
WBC: 5.8 10*3/uL (ref 4.0–10.5)
nRBC: 0 % (ref 0.0–0.2)

## 2019-08-07 LAB — HEPARIN LEVEL (UNFRACTIONATED): Heparin Unfractionated: <0.1 IU/mL — ABNORMAL LOW (ref 0.30–0.70)

## 2019-08-07 MED ORDER — ISOSORBIDE MONONITRATE ER 60 MG PO TB24
60.0000 mg | ORAL_TABLET | Freq: Every day | ORAL | Status: DC
  Administered 2019-08-07: 60 mg via ORAL
  Filled 2019-08-07: qty 1

## 2019-08-07 MED ORDER — RANOLAZINE ER 500 MG PO TB12
1000.0000 mg | ORAL_TABLET | Freq: Two times a day (BID) | ORAL | Status: DC
  Administered 2019-08-07: 1000 mg via ORAL
  Filled 2019-08-07 (×3): qty 2

## 2019-08-07 MED ORDER — CINACALCET HCL 30 MG PO TABS
30.0000 mg | ORAL_TABLET | Freq: Every day | ORAL | Status: DC
  Administered 2019-08-07: 30 mg via ORAL
  Filled 2019-08-07 (×2): qty 1

## 2019-08-07 MED ORDER — NEPRO/CARBSTEADY PO LIQD
237.0000 mL | Freq: Two times a day (BID) | ORAL | Status: DC
  Administered 2019-08-07: 237 mL via ORAL

## 2019-08-07 MED ORDER — MORPHINE SULFATE (PF) 2 MG/ML IV SOLN
2.0000 mg | Freq: Once | INTRAVENOUS | Status: AC
  Administered 2019-08-07: 2 mg via INTRAVENOUS
  Filled 2019-08-07: qty 1

## 2019-08-07 MED ORDER — FOLIC ACID 1 MG PO TABS
1.0000 mg | ORAL_TABLET | Freq: Every day | ORAL | Status: DC
  Administered 2019-08-07: 1 mg via ORAL
  Filled 2019-08-07: qty 1

## 2019-08-07 MED ORDER — NITROGLYCERIN 0.4 MG SL SUBL: 0.4000 mg | SUBLINGUAL_TABLET | SUBLINGUAL | 12 refills | Status: DC | PRN

## 2019-08-07 MED ORDER — RANOLAZINE ER 1000 MG PO TB12: 1000.0000 mg | ORAL_TABLET | Freq: Two times a day (BID) | ORAL | 1 refills | Status: AC

## 2019-08-07 MED ORDER — MORPHINE SULFATE (PF) 2 MG/ML IV SOLN: 2.0000 mg | INTRAVENOUS | Status: DC | PRN

## 2019-08-07 MED ORDER — SEVELAMER CARBONATE 800 MG PO TABS
800.0000 mg | ORAL_TABLET | Freq: Three times a day (TID) | ORAL | Status: DC
  Administered 2019-08-07 (×2): 800 mg via ORAL
  Filled 2019-08-07: qty 1

## 2019-08-07 MED ORDER — NITROGLYCERIN 2 % TD OINT
1.0000 [in_us] | TOPICAL_OINTMENT | Freq: Four times a day (QID) | TRANSDERMAL | Status: DC
  Administered 2019-08-07 (×2): 1 [in_us] via TOPICAL
  Filled 2019-08-07: qty 1

## 2019-08-07 MED ORDER — NEPRO/CARBSTEADY PO LIQD: 237.0000 mL | ORAL | 0 refills | Status: AC

## 2019-08-07 NOTE — Plan of Care (Signed)
  Problem: Education: Goal: Knowledge of General Education information will improve Description Including pain rating scale, medication(s)/side effects and non-pharmacologic comfort measures Outcome: Progressing   Problem: Health Behavior/Discharge Planning: Goal: Ability to manage health-related needs will improve Outcome: Progressing   Problem: Clinical Measurements: Goal: Ability to maintain clinical measurements within normal limits will improve Outcome: Progressing   Problem: Clinical Measurements: Goal: Will remain free from infection Outcome: Progressing   Problem: Clinical Measurements: Goal: Cardiovascular complication will be avoided Outcome: Progressing   

## 2019-08-07 NOTE — ED Notes (Signed)
Pt called out for new onset chest pain, this RN to bedside. Pt states pain is heaviness in chest, making her short of breath. Pt had 3 beat run Vtach. Pt currently has heparin infusing through IV. NP ouma notified of pts change in status. Orders for morphine, nitro, repeat ekg and stat troponin at this time.

## 2019-08-07 NOTE — ED Notes (Signed)
NP Ouma to pts bedside 

## 2019-08-07 NOTE — Progress Notes (Addendum)
[place] for heparin Indication: chest pain/ACS  Allergies  Allergen Reactions  . Nystatin Hives and Itching  . Sulfa Antibiotics Swelling, Hives and Rash  . Niaspan [Niacin] Other (See Comments) and Hives    Patient Measurements: Height: 5\' 6"  (167.6 cm) Weight: 224 lb (101.6 kg) IBW/kg (Calculated) : 59.3 Heparin Dosing Weight:   Vital Signs: Temp: 99.5 F (37.5 C) (11/18 0244) Temp Source: Oral (11/18 0244) BP: 105/73 (11/18 0244) Pulse Rate: 82 (11/18 0244)  Labs: Recent Labs    08/05/19 2136 08/05/19 2328 08/06/19 0013  08/06/19 0426 08/06/19 0840 08/06/19 1716 08/07/19 0038 08/07/19 0254 08/07/19 0524  HGB 9.1*  --   --   --   --   --   --   --   --  9.1*  HCT 29.7*  --   --   --   --   --   --   --   --  28.6*  PLT 204  --   --   --   --   --   --   --   --  186  APTT  --   --  36  --   --   --   --   --   --   --   LABPROT  --   --  13.3  --   --   --   --   --   --   --   INR  --   --  1.0  --   --   --   --   --   --   --   HEPARINUNFRC  --   --   --   --   --  0.42 0.45  --   --  <0.10*  CREATININE  --  7.94*  --   --  8.02*  --   --   --   --   --   TROPONINIHS  --  48*  --    < > 89*  --   --  149* 150*  --    < > = values in this interval not displayed.    Estimated Creatinine Clearance: 8.1 mL/min (A) (by C-G formula based on SCr of 8.02 mg/dL (H)).   Medical History: Past Medical History:  Diagnosis Date  . Asthma   . CAD (coronary artery disease)   . Chronic lower back pain   . COPD (chronic obstructive pulmonary disease) ([place])   . Depression   . Diabetes mellitus without complication (HCC)    NIDD  . H/O angina pectoris   . H/O blood clots   . Hypercholesteremia   . Hypertension   . Left rotator cuff tear   . Myocardial infarction ([place])   . Obesity   . Renal insufficiency   . Stroke ([place])   . Vertigo, aural     Medications:  Scheduled:  . aspirin  81 mg Oral Daily  . atorvastatin  80  mg Oral QHS  . Chlorhexidine Gluconate Cloth  6 each Topical Q0600  . clopidogrel  75 mg Oral Daily  . fluticasone furoate-vilanterol  1 puff Inhalation Daily  . isosorbide mononitrate  30 mg Oral Daily  . metoprolol succinate  100 mg Oral Q breakfast  . nitroGLYCERIN  1 inch Topical Q6H  . ranolazine  500 mg Oral BID  . tiotropium  1 capsule Inhalation Daily  . torsemide  100 mg Oral  Daily  . venlafaxine XR  37.5 mg Oral Q breakfast    Assessment: Patient arrives to ED w/ h/o of ESRD on dialysis C/O chest pain down left arm, EKG showing some ST depression, no anticoagulation PTA, trops pending. Patient is being started on heparin drip for ACS/NSTEMI  11/16: Will bolus heparin 4000 units IV x 1 Will start rate at 1000 units/hr.  11/17 0840 HL= 0.42.  11/17 1716 HL= 0.45. Therapeutic x2. Per nurse, heparin has not been interrupted but does acknowledges patient was in dialysis earlier today and was not informed of heparin interruptions (if there were any).   Goal of Therapy:  Heparin level 0.3-0.7 units/ml Monitor platelets by anticoagulation protocol: Yes   Plan:  11/18 @ 0544 heparin was stopped d/t patient having long lasting epistaxis and HL < 0.10. will need to f/u on clinical course and continuation of anticoagulation, patient's trops appear to have stabilized at 150, will continue to follow.  Tobie Lords, PharmD, BCPS Clinical Pharmacist 08/07/2019

## 2019-08-07 NOTE — Progress Notes (Addendum)
    BRIEF OVERNIGHT PROGRESS REPORT   ADDENDUM @4 :15 am: Patient developed acute onset left epistaxis lasting longer than 5 minutes. Heparin has been held. Pressure applied.   SUBJECTIVE: Per nursing staff, patient c/o chest pain describing as heavy feeling associated with SOB. She was also noted with 3 beat run Vtach. No reports of associated n/v, diaphoresis or radiation.  OBJECTIVE: On arrival to the ED, he was afebrile with blood pressure 134/72 mm Hg and pulse rate 87 beats/min. There were no focal neurological deficits; she was alert and oriented x4, but c/o of chest discomfort.  EKG: Reviewed and shows NSR with widespread ST depression and mild ST elevation in aVR  ASSESSMENT: 68 y.o. female with medical history significant for severe three-vessel CAD s/p CABG and PCI, PAD s/p right femoral popliteal revascularization, ESRD on TTS HD, COPD, hypertension, hyperlipidemia, controlled type 2 diabetes, history of CVA, anxiety/depression, anemia of chronic disease, and OSA who presents to the ED for evaluation of chest pain.  PLAN: 1. Unstable Angina (dynamic EKG changes with abnormal cardiac enzymes)  - STAT EKG obtained as above - Repeat Troponin elevated 149 up from 89 - Continue to trend troponin - Patient received NTG sublingual and Morphine x 2 with improvement - NTG + morphine PRN chest pain or NTG drip if ongoing chest pain  - Continue Atorvastatin - Continue Anti-coagulation (Heparin drip per ACS protocol) - Discussed findings with on call STEMI cardiology Dr. Saunders Revel who reviewed new changes and recommend more aggressive medical management and consultation with [facility] cardiology. See detailed Cardiologist note. - Discussed with patient's Cardiologist Dr. Saralyn Pilar who agree patient does not meet STEMI criteria. Will continue to manage medically with nitropaste and trend troponin    Rufina Falco, DNP, CCRN, FNP-C [facility] Nurse Practitioner Between 7pm to [place]  - Pager [phone]  After 7am go to [email or web address] - password:[email or web address]  [facility] [employer]  [phone]

## 2019-08-07 NOTE — Progress Notes (Signed)
[facility] Cardiology  SUBJECTIVE: Patient laying in bed, denies chest pain or shortness of breath.  Patient had episode of chest pain last evening, slowly relieved after sublingual nitroglycerin.  ECG revealed sinus rhythm with T abnormalities inferolaterally which appears unchanged compared to prior multiple ECGs.   Vitals:   08/07/19 0120 08/07/19 0217 08/07/19 0244 08/07/19 0720  BP: 134/72 (!) 118/54 105/73 125/67  Pulse: 87 78 82 83  Resp: '18  20 17  '$ Temp:   99.5 F (37.5 C) 99.1 F (37.3 C)  TempSrc:   Oral Oral  SpO2: 97% 98% (!) 89% 93%  Weight:   101.6 kg   Height:   '5\' 6"'$  (1.676 m)      Intake/Output Summary (Last 24 hours) at 08/07/2019 6160 Last data filed at 08/06/2019 1847 Gross per 24 hour  Intake 120 ml  Output 3000 ml  Net -2880 ml      PHYSICAL EXAM  General: Well developed, well nourished, in no acute distress HEENT:  Normocephalic and atramatic Neck:  No JVD.  Lungs: Clear bilaterally to auscultation and percussion. Heart: HRRR . Normal S1 and S2 without gallops or murmurs.  Abdomen: Bowel sounds are positive, abdomen soft and non-tender  Msk:  Back normal, normal gait. Normal strength and tone for age. Extremities: No clubbing, cyanosis or edema.   Neuro: Alert and oriented X 3. Psych:  Good affect, responds appropriately   LABS: Basic Metabolic Panel: Recent Labs    08/05/19 2328 08/06/19 0426  NA 139 138  K 4.1 4.7  CL 99 100  CO2 22 23  GLUCOSE 143* 139*  BUN 56* 57*  CREATININE 7.94* 8.02*  CALCIUM 7.9* 7.3*  PHOS  --  7.4*   Liver Function Tests: Recent Labs    08/06/19 0426  ALBUMIN 3.1*   No results for input(s): LIPASE, AMYLASE in the last 72 hours. CBC: Recent Labs    08/05/19 2136 08/07/19 0524  WBC 6.5 5.8  HGB 9.1* 9.1*  HCT 29.7* 28.6*  MCV 103.8* 100.0  PLT 204 186   Cardiac Enzymes: No results for input(s): CKTOTAL, CKMB, CKMBINDEX, TROPONINI in the last 72 hours. BNP: Invalid input(s): POCBNP D-Dimer: No  results for input(s): DDIMER in the last 72 hours. Hemoglobin A1C: No results for input(s): HGBA1C in the last 72 hours. Fasting Lipid Panel: No results for input(s): CHOL, HDL, LDLCALC, TRIG, CHOLHDL, LDLDIRECT in the last 72 hours. Thyroid Function Tests: No results for input(s): TSH, T4TOTAL, T3FREE, THYROIDAB in the last 72 hours.  Invalid input(s): FREET3 Anemia Panel: No results for input(s): VITAMINB12, FOLATE, FERRITIN, TIBC, IRON, RETICCTPCT in the last 72 hours.  US Venous Img Lower Unilateral Right (dvt)  Result Date: 08/06/2019 CLINICAL DATA:  Right leg swelling for 1 month. EXAM: RIGHT LOWER EXTREMITY VENOUS DOPPLER ULTRASOUND TECHNIQUE: Gray-scale sonography with graded compression, as well as color Doppler and duplex ultrasound were performed to evaluate the lower extremity deep venous systems from the level of the common femoral vein and including the common femoral, femoral, profunda femoral, popliteal and calf veins including the posterior tibial, peroneal and gastrocnemius veins when visible. The superficial great saphenous vein was also interrogated. Spectral Doppler was utilized to evaluate flow at rest and with distal augmentation maneuvers in the common femoral, femoral and popliteal veins. COMPARISON:  None. FINDINGS: Contralateral Common Femoral Vein: Respiratory phasicity is normal and symmetric with the symptomatic side. No evidence of thrombus. Normal compressibility. Common Femoral Vein: No evidence of thrombus. Normal compressibility, respiratory phasicity and  response to augmentation. Saphenofemoral Junction: No evidence of thrombus. Normal compressibility and flow on color Doppler imaging. Profunda Femoral Vein: No evidence of thrombus. Normal compressibility and flow on color Doppler imaging. Femoral Vein: No evidence of thrombus. Normal compressibility, respiratory phasicity and response to augmentation. Popliteal Vein: No evidence of thrombus. Normal compressibility,  respiratory phasicity and response to augmentation. Calf Veins: No evidence of thrombus. Normal compressibility and flow on color Doppler imaging. Superficial Great Saphenous Vein: No evidence of thrombus. Normal compressibility. Venous Reflux:  None. Other Findings: Complex fluid collection in the right popliteal fossa measuring 8.1 2.9 x 2.5 cm, likely ossified intra-articular bodies. IMPRESSION: 1. No evidence of right lower extremity DVT. 2. Complex fluid collection in the right popliteal fossa likely Baker cyst containing probable intra-articular bodies. Electronically Signed   By: Keith Rake M.D.   On: 08/06/2019 04:34   Dg Chest Portable 1 View  Result Date: 08/05/2019 CLINICAL DATA:  Left side chest pain EXAM: PORTABLE CHEST 1 VIEW COMPARISON:  07/06/2019 FINDINGS: Cardiomegaly with vascular congestion. Diffuse interstitial prominence likely reflects interstitial edema. No confluent opacities or effusions. Aortic atherosclerosis. No acute bony abnormality. IMPRESSION: Cardiomegaly, suspect mild interstitial edema. Electronically Signed   By: Rolm Baptise M.D.   On: 08/05/2019 21:56     Echo LVEF 55% by echocardiogram 06/21/2019  TELEMETRY: Sinus rhythm:  ASSESSMENT AND PLAN:  Principal Problem:   Chest pain Active Problems:   ESRD on dialysis [facility])   Benign essential hypertension   Coronary artery disease of native heart with stable angina pectoris (HCC)   Mixed hyperlipidemia   OSA on CPAP   Type 2 diabetes mellitus (HCC)   Anemia of chronic disease   Anxiety, generalized   COPD (chronic obstructive pulmonary disease) ([place])    1.  Chest pain, with abnormal ECG at baseline, without significant change, with mildly elevated troponin ( 20, 48, 75, 89, 149, 150 ).  She has known coronary artery disease, status post CABG times 09/2010, with occluded LIMA to LAD, multiple cardiac catheterizations with most recent 05/2018, which revealed "very tortuous" left anterior descending  coronary artery, patent stent proximal left circumflex with ostial stenosis OM1, chronically occluded RCA.  According to the patient, she normally has recurrent chest pain at home, takes isosorbide mononitrate on a as needed basis, on Ranexa.  Patient has seen multiple cardiologists, reports that Dr. Claudina Lick at [facility] is her primary cardiologist.  Last evening, patient experienced chest pain, STEMI cardiologist was called, who did not think ECG met STEMI criteria.  I agree, ECG last evening, looks similar to prior ECGs, and in fact looked less ischemic compared to EKG performed 07/07/2019.  Heparin was discontinued due to nosebleed. 2.  ESRD on chronic hemodialysis  Recommendations  1.  Agree with overall current therapy 2.  Defer repeat cardiac catheterization at this time.  Patient agrees to pursue initial conservative management, and to have follow-up with her cardiologist Dr. Claudina Lick in the next 1 to 2 weeks. 3.  Agree with stopping heparin drip 4.  Uptitrate isosorbide mononitrate 60 mg daily 5.  Uptitrate Ranexa to 1000 mg twice daily 6.  No further cardiac diagnostics at this time 7.  Follow-up Dr. Whitney Muse 1 week after discharge   Isaias Cowman, MD, PhD, [facility] 08/07/2019 9:09 AM

## 2019-08-07 NOTE — Progress Notes (Signed)
[facility], [place] 08/07/19  Subjective:   LOS: 1 11/17 0701 - 11/18 0700 In: 120 [P.O.:120] Out: 3000   Denies any acute complaints.  Had chest pressure this morning. Patient states that Imdur has been giving her headaches and she has not been taking it consistently This morning she is feeling shaky.  Blood sugar in the 80s.  Patient states that is low for her.  Appetite remains poor.  Did not eat any breakfast. 3 L removed with dialysis yesterday   Objective:  Vital signs in last 24 hours:  Temp:  [97.2 F (36.2 C)-99.5 F (37.5 C)] 99.1 F (37.3 C) (11/18 0720) Pulse Rate:  [64-89] 83 (11/18 0720) Resp:  [14-25] 17 (11/18 0720) BP: (100-154)/(54-82) 125/67 (11/18 0720) SpO2:  [89 %-100 %] 93 % (11/18 0720) Weight:  [101.6 kg] 101.6 kg (11/18 0244)  Weight change: -1.294 kg Filed Weights   08/05/19 2122 08/07/19 0244  Weight: 102.9 kg 101.6 kg    Intake/Output:    Intake/Output Summary (Last 24 hours) at 08/07/2019 1043 Last data filed at 08/06/2019 1847 Gross per 24 hour  Intake 120 ml  Output 3000 ml  Net -2880 ml     Physical Exam: General:  Laying in the bed, no acute distress  HEENT  anicteric, moist oral mucous membranes  Pulm/lungs  clear to auscultation bilaterally, [place] O2  CVS/Heart  no rub or gallop  Abdomen:   Soft, nontender  Extremities:  Trace pitting edema  Neurologic:  Alert, oriented, speech normal, tremors in hands,  Skin:  No acute rashes  Access:  Left arm AV fistula       Basic Metabolic Panel:  Recent Labs  Lab 08/05/19 2328 08/06/19 0426  NA 139 138  K 4.1 4.7  CL 99 100  CO2 22 23  GLUCOSE 143* 139*  BUN 56* 57*  CREATININE 7.94* 8.02*  CALCIUM 7.9* 7.3*  PHOS  --  7.4*     CBC: Recent Labs  Lab 08/05/19 2136 08/07/19 0524  WBC 6.5 5.8  HGB 9.1* 9.1*  HCT 29.7* 28.6*  MCV 103.8* 100.0  PLT 204 186      Lab Results  Component Value Date   HEPBSAG Negative 01/11/2018   HEPBSAB Reactive 01/11/2018      Microbiology:  Recent Results (from the past 240 hour(s))  SARS CORONAVIRUS 2 (TAT 6-24 HRS) Nasopharyngeal Nasopharyngeal Swab     Status: None   Collection Time: 08/06/19 12:26 AM   Specimen: Nasopharyngeal Swab  Result Value Ref Range Status   SARS Coronavirus 2 NEGATIVE NEGATIVE Final    Comment: (NOTE) SARS-CoV-2 target nucleic acids are NOT DETECTED. The SARS-CoV-2 RNA is generally detectable in upper and lower respiratory specimens during the acute phase of infection. Negative results do not preclude SARS-CoV-2 infection, do not rule out co-infections with other pathogens, and should not be used as the sole basis for treatment or other patient management decisions. Negative results must be combined with clinical observations, patient history, and epidemiological information. The expected result is Negative. Fact Sheet for Patients: SugarRoll.be Fact Sheet for Healthcare Providers: https://www.woods-mathews.com/ This test is not yet approved or cleared by the [place] FDA and  has been authorized for detection and/or diagnosis of SARS-CoV-2 by FDA under an Emergency Use Authorization (EUA). This EUA will remain  in effect (meaning this test can be used) for the duration of the COVID-19 declaration under Section 56 4(b)(1) of the Act, 21 U.S.C. section 360bbb-3(b)(1), unless the  authorization is terminated or revoked sooner. Performed at [facility], [place] [address], [place],  [address]     Coagulation Studies: Recent Labs    08/06/19 0013  LABPROT 13.3  INR 1.0    Urinalysis: No results for input(s): COLORURINE, LABSPEC, PHURINE, GLUCOSEU, HGBUR, BILIRUBINUR, KETONESUR, PROTEINUR, UROBILINOGEN, NITRITE, LEUKOCYTESUR in the last 72 hours.  Invalid input(s): APPERANCEUR    Imaging: US Venous Img Lower Unilateral Right (dvt)  Result Date: 08/06/2019 CLINICAL DATA:   Right leg swelling for 1 month. EXAM: RIGHT LOWER EXTREMITY VENOUS DOPPLER ULTRASOUND TECHNIQUE: Gray-scale sonography with graded compression, as well as color Doppler and duplex ultrasound were performed to evaluate the lower extremity deep venous systems from the level of the common femoral vein and including the common femoral, femoral, profunda femoral, popliteal and calf veins including the posterior tibial, peroneal and gastrocnemius veins when visible. The superficial great saphenous vein was also interrogated. Spectral Doppler was utilized to evaluate flow at rest and with distal augmentation maneuvers in the common femoral, femoral and popliteal veins. COMPARISON:  None. FINDINGS: Contralateral Common Femoral Vein: Respiratory phasicity is normal and symmetric with the symptomatic side. No evidence of thrombus. Normal compressibility. Common Femoral Vein: No evidence of thrombus. Normal compressibility, respiratory phasicity and response to augmentation. Saphenofemoral Junction: No evidence of thrombus. Normal compressibility and flow on color Doppler imaging. Profunda Femoral Vein: No evidence of thrombus. Normal compressibility and flow on color Doppler imaging. Femoral Vein: No evidence of thrombus. Normal compressibility, respiratory phasicity and response to augmentation. Popliteal Vein: No evidence of thrombus. Normal compressibility, respiratory phasicity and response to augmentation. Calf Veins: No evidence of thrombus. Normal compressibility and flow on color Doppler imaging. Superficial Great Saphenous Vein: No evidence of thrombus. Normal compressibility. Venous Reflux:  None. Other Findings: Complex fluid collection in the right popliteal fossa measuring 8.1 2.9 x 2.5 cm, likely ossified intra-articular bodies. IMPRESSION: 1. No evidence of right lower extremity DVT. 2. Complex fluid collection in the right popliteal fossa likely Baker cyst containing probable intra-articular bodies.  Electronically Signed   By: Keith Rake M.D.   On: 08/06/2019 04:34   Dg Chest Portable 1 View  Result Date: 08/05/2019 CLINICAL DATA:  Left side chest pain EXAM: PORTABLE CHEST 1 VIEW COMPARISON:  07/06/2019 FINDINGS: Cardiomegaly with vascular congestion. Diffuse interstitial prominence likely reflects interstitial edema. No confluent opacities or effusions. Aortic atherosclerosis. No acute bony abnormality. IMPRESSION: Cardiomegaly, suspect mild interstitial edema. Electronically Signed   By: Rolm Baptise M.D.   On: 08/05/2019 21:56     Medications:    . aspirin  81 mg Oral Daily  . atorvastatin  80 mg Oral QHS  . Chlorhexidine Gluconate Cloth  6 each Topical Q0600  . cinacalcet  30 mg Oral Q breakfast  . clopidogrel  75 mg Oral Daily  . feeding supplement (NEPRO CARB STEADY)  237 mL Oral BID BM  . fluticasone furoate-vilanterol  1 puff Inhalation Daily  . folic acid  1 mg Oral Daily  . isosorbide mononitrate  60 mg Oral Daily  . metoprolol succinate  100 mg Oral Q breakfast  . ranolazine  1,000 mg Oral BID  . sevelamer carbonate  800 mg Oral TID WC  . tiotropium  1 capsule Inhalation Daily  . torsemide  100 mg Oral Daily  . venlafaxine XR  37.5 mg Oral Q breakfast   acetaminophen, albuterol, nitroGLYCERIN, ondansetron (ZOFRAN) IV  Assessment/ Plan:  68 y.o. female with stage renal disease on hemodialysis,  coronary artery disease with history of CABG 2012 and PCI, peripheral arterial disease, history of right femoral-popliteal revascularization, COPD, hypertension, hyperlipidemia, diabetes type 2, history of stroke, history of anxiety and depression, obstructive sleep apnea admitted for evaluation of chest pain  Principal Problem:   Chest pain Active Problems:   ESRD on dialysis ([place])   Benign essential hypertension   Coronary artery disease of native heart with stable angina pectoris (HCC)   Mixed hyperlipidemia   OSA on CPAP   Type 2 diabetes mellitus (HCC)    Anemia of chronic disease   Anxiety, generalized   COPD (chronic obstructive pulmonary disease) ([place])   Unstable angina ([place])   #. ESRD with acute pulmonary edema [facility]// TuThSa-1//TW//98kg// [facility] -3 L removed with dialysis on Tuesday -Patient has outpatient appointment in [facility] tomorrow per cardiology.  Called and arranged outpatient dialysis with her outpatient unit today.  Patient will go straight after discharge to her dialysis.  #. Anemia of CKD  Lab Results  Component Value Date   HGB 9.1 (L) 08/07/2019   Low dose EPO with HD  #. SHPTH     Component Value Date/Time   PTH 472 (H) 07/06/2019 1455   Lab Results  Component Value Date   PHOS 7.4 (H) 08/06/2019   Monitor calcium and phos level during this admission Resume cinacalcet at home dose Renvela 800 mg 3 times a day with meals once able to take a normal diet  #Chest pain -Cardiology evaluation ongoing for angina.  Patient recently had a positive stress test Patient with follow-up with cardiologist at [facility] in [place].     LOS: [place] 11/18/202010:43 [place], [place]

## 2019-08-07 NOTE — Discharge Summary (Signed)
[facility] NAME: Kimberly Shaffer    MR#:  [id number]  DATE OF BIRTH:  [birth year]  DATE OF ADMISSION:  08/05/2019 ADMITTING PHYSICIAN: Loletha Grayer, MD  DATE OF DISCHARGE: 08/07/2019  PRIMARY CARE PHYSICIAN: [facility]    ADMISSION DIAGNOSIS:  Chest pain, unspecified type [R07.9] Edema of right lower leg [R60.0] Chest pain [R07.9]  DISCHARGE DIAGNOSIS:  Unstable angina-- history of triple vessel disease ESRD on HD  SECONDARY DIAGNOSIS:   Past Medical History:  Diagnosis Date  . Asthma   . CAD (coronary artery disease)   . Chronic lower back pain   . COPD (chronic obstructive pulmonary disease) ([place])   . Depression   . Diabetes mellitus without complication (HCC)    NIDD  . H/O angina pectoris   . H/O blood clots   . Hypercholesteremia   . Hypertension   . Left rotator cuff tear   . Myocardial infarction ([place])   . Obesity   . Renal insufficiency   . Stroke ([place])   . Vertigo, aural     HOSPITAL COURSE:  Kimberly Shaffer is a 68 y.o. female with medical history significant for severe three-vessel CAD s/p CABG and PCI, PAD s/p right femoral popliteal revascularization, ESRD on TTS HD, COPD, hypertension, hyperlipidemia, controlled type 2 diabetes, history of CVA, anxiety/depression, anemia of chronic disease, and OSA who presents to the ED for evaluation of chest pain.  * Unstable angina with h/o acute on chroni Chest pain with severe three-vessel CAD s/p CABG and PCI in the past -Symptoms suggestive of unstable angina.  - She was started on IV heparin drip in the ED-- discontinued by Dr Josefa Half - Troponin slightly elevated at 48--149--150 -.  EKG with more pronounced ST depressions in the inferolateral leads compared to prior.  -She is chest pain-free currently   Echo at [facility] 06/21/2019 showed EF >55%.  -Continue aspirin, Plavix, Imdur, Toprol-XL, Ranexa, atorvastatin -Seen by Dr Josefa Half--  recommends to follow-up with cardiology in [facility] Dr Claudina Lick-- already has an appointment this week. -She has known coronary artery disease, status post CABG times 09/2010, with occluded LIMA to LAD, multiple cardiac catheterizations with most recent 05/2018, which revealed "very tortuous" left anterior descending coronary artery, patent stent proximal left circumflex with ostial stenosis OM1, chronically occluded RCA.  *ESRD on TTS HD: Has slight interstitial edema on chest x-ray. --s/p HD with 3liter UF -We will continue home torsemide  -breathing more comfortably. She is chronically on home oxygen. -Seen by Dr. Candiss Norse  COPD: Chronic and stable.   -Continue Breo, and as needed albuterol.  - Continue supplemental O2 as needed-- is chronically at home.  PAD s/p right femoral popliteal revascularization 06/17/2019: Reports increased right lower extremity swelling without significant pain.  - RLE ultrasound  Negative for DVT.Baker's cyst + -prn tylenol  Hypertension: Continue Toprol-XL, Imdur, torsemide as BP tolerates.  Hyperlipidemia: Continue atorvastatin.  Anemia chronic disease: Chronic and stable relative to recent labs.    OSA: States she has CPAP equipment at home and  She has been using 2 L supplemental O2 via [place] at night.   DVT prophylaxis: Heparin [place] Code Status: DNR, confirmed with patient Family Communication: Discussed with patient, she states family is aware Disposition Plan: home today Consults :  cardiology and nephrology consults   Patient overall hemodynamically stable. She she is agreeable to go home with outpatient follow-up with cardiology and [facility] CONSULTS OBTAINED:  Treatment Team:  Paraschos,  Sheppard Coil, MD  DRUG ALLERGIES:   Allergies  Allergen Reactions  . Nystatin Hives and Itching  . Sulfa Antibiotics Swelling, Hives and Rash  . Niaspan [Niacin] Other (See Comments) and Hives    DISCHARGE MEDICATIONS:   Allergies as of 08/07/2019       Reactions   Nystatin Hives, Itching   Sulfa Antibiotics Swelling, Hives, Rash   Niaspan [niacin] Other (See Comments), Hives      Medication List    STOP taking these medications   docusate sodium 100 MG capsule Commonly known as: COLACE   ondansetron 4 MG tablet Commonly known as: ZOFRAN   polyethylene glycol 17 g packet Commonly known as: MIRALAX / GLYCOLAX   tiotropium 18 MCG inhalation capsule Commonly known as: Spiriva HandiHaler     TAKE these medications   acetaminophen 325 MG tablet Commonly known as: TYLENOL Take 2 tablets (650 mg total) by mouth every 6 (six) hours as needed for mild pain (or Fever >/= 101).   albuterol 108 (90 Base) MCG/ACT inhaler Commonly known as: VENTOLIN HFA Inhale 1 puff into the lungs every 6 (six) hours as needed for wheezing or shortness of breath.   albuterol (2.5 MG/3ML) 0.083% nebulizer solution Commonly known as: PROVENTIL Take 3 mLs (2.5 mg total) by nebulization every 4 (four) hours as needed for wheezing or shortness of breath.   amLODipine 10 MG tablet Commonly known as: NORVASC Take 10 mg by mouth daily.   aspirin 81 MG chewable tablet Chew 81 mg by mouth daily.   atorvastatin 80 MG tablet Commonly known as: LIPITOR Take 80 mg by mouth at bedtime.   calcium carbonate 500 MG chewable tablet Commonly known as: TUMS - dosed in mg elemental calcium Chew 2 tablets by mouth 3 (three) times daily.   cetirizine 10 MG tablet Commonly known as: ZYRTEC Take 10 mg by mouth daily.   cinacalcet 30 MG tablet Commonly known as: Sensipar Take 1 tablet (30 mg total) by mouth daily before breakfast.   clopidogrel 75 MG tablet Commonly known as: PLAVIX Take 1 tablet (75 mg total) by mouth daily.   feeding supplement (NEPRO CARB STEADY) Liqd Take 237 mLs by mouth daily.   fluticasone 50 MCG/ACT nasal spray Commonly known as: FLONASE Place 2 sprays into both nostrils 2 (two) times daily.   fluticasone furoate-vilanterol  200-25 MCG/INH Aepb Commonly known as: BREO ELLIPTA Inhale 1 puff into the lungs daily.   folic acid 1 MG tablet Commonly known as: FOLVITE Take 1 mg by mouth daily.   isosorbide mononitrate 30 MG 24 hr tablet Commonly known as: IMDUR Take 30 mg by mouth daily.   lidocaine-prilocaine cream Commonly known as: EMLA Apply 1 application topically as needed (topical anesthesia for hemodialysis if Gebauers and Lidocaine injection are ineffective.).   meclizine 12.5 MG tablet Commonly known as: ANTIVERT Take 12.5 mg by mouth 3 (three) times daily as needed for dizziness.   metoprolol succinate 100 MG 24 hr tablet Commonly known as: TOPROL-XL Take 1 tablet (100 mg total) by mouth daily with breakfast. Take with or immediately following a meal.   mirtazapine 7.5 MG tablet Commonly known as: REMERON Take 7.5 mg by mouth at bedtime.   multivitamin Tabs tablet Take 1 tablet by mouth at bedtime.   nitroGLYCERIN 0.4 MG SL tablet Commonly known as: NITROSTAT Place 1 tablet (0.4 mg total) under the tongue every 5 (five) minutes as needed for chest pain.   Oxycodone HCl 10 MG Tabs Take 10 mg  by mouth See admin instructions. Take 1 tablet (10mg ) by mouth 4 to 5 times daily as needed for severe pain   pantoprazole 40 MG tablet Commonly known as: PROTONIX Take 40 mg by mouth 2 (two) times daily.   ranolazine 1000 MG SR tablet Commonly known as: RANEXA Take 1 tablet (1,000 mg total) by mouth 2 (two) times daily. What changed:   medication strength  how much to take   sevelamer carbonate 800 MG tablet Commonly known as: RENVELA Take 2,400 mg by mouth 3 (three) times daily with meals.   torsemide 100 MG tablet Commonly known as: DEMADEX Take 100 mg by mouth daily.       If you experience worsening of your admission symptoms, develop shortness of breath, life threatening emergency, suicidal or homicidal thoughts you must seek medical attention immediately by calling 911 or  calling your MD immediately  if symptoms less severe.  You Must read complete instructions/literature along with all the possible adverse reactions/side effects for all the Medicines you take and that have been prescribed to you. Take any new Medicines after you have completely understood and accept all the possible adverse reactions/side effects.   Please note  You were cared for by a hospitalist during your hospital stay. If you have any questions about your discharge medications or the care you received while you were in the hospital after you are discharged, you can call the unit and asked to speak with the hospitalist on call if the hospitalist that took care of you is not available. Once you are discharged, your primary care physician will handle any further medical issues. Please note that NO REFILLS for any discharge medications will be authorized once you are discharged, as it is imperative that you return to your primary care physician (or establish a relationship with a primary care physician if you do not have one) for your aftercare needs so that they can reassess your need for medications and monitor your lab values. Today   SUBJECTIVE  shaky this morning. His decreased appetite. Wants Nepro drink   VITAL SIGNS:  Blood pressure 125/67, pulse 83, temperature 99.1 F (37.3 C), temperature source Oral, resp. rate 17, height 5\' 6"  (1.676 m), weight 101.6 kg, SpO2 93 %.  I/O:    Intake/Output Summary (Last 24 hours) at 08/07/2019 1032 Last data filed at 08/06/2019 1847 Gross per 24 hour  Intake 120 ml  Output 3000 ml  Net -2880 ml    PHYSICAL EXAMINATION:  GENERAL:  68 y.o.-year-old patient lying in the bed with no acute distress. Appears chronically ill EYES: Pupils equal, round, reactive to light and accommodation. No scleral icterus. Extraocular muscles intact.  HEENT: Head atraumatic, normocephalic. Oropharynx and nasopharynx clear.  NECK:  Supple, no jugular venous  distention. No thyroid enlargement, no tenderness.  LUNGS:  Decreased breath sounds bilaterally in bases, no wheezing, rales,rhonchi or crepitation. No use of accessory muscles of respiration.  CARDIOVASCULAR: S1, S2 normal. No murmurs, rubs, or gallops.  ABDOMEN: Soft, non-tender, non-distended. Bowel sounds present. No organomegaly or mass.  EXTREMITIES: No pedal edema, cyanosis, or clubbing.  NEUROLOGIC: Cranial nerves II through XII are intact. Muscle strength 5/5 in all extremities. Sensation intact. Gait not checked.  PSYCHIATRIC:  patient is alert and oriented x 3.  SKIN: No obvious rash, lesion, or ulcer.   DATA REVIEW:   CBC  Recent Labs  Lab 08/07/19 0524  WBC 5.8  HGB 9.1*  HCT 28.6*  PLT 186  Chemistries  Recent Labs  Lab 08/06/19 0426  NA 138  K 4.7  CL 100  CO2 23  GLUCOSE 139*  BUN 57*  CREATININE 8.02*  CALCIUM 7.3*    Microbiology Results   Recent Results (from the past 240 hour(s))  SARS CORONAVIRUS 2 (TAT 6-24 HRS) Nasopharyngeal Nasopharyngeal Swab     Status: None   Collection Time: 08/06/19 12:26 AM   Specimen: Nasopharyngeal Swab  Result Value Ref Range Status   SARS Coronavirus 2 NEGATIVE NEGATIVE Final    Comment: (NOTE) SARS-CoV-2 target nucleic acids are NOT DETECTED. The SARS-CoV-2 RNA is generally detectable in upper and lower respiratory specimens during the acute phase of infection. Negative results do not preclude SARS-CoV-2 infection, do not rule out co-infections with other pathogens, and should not be used as the sole basis for treatment or other patient management decisions. Negative results must be combined with clinical observations, patient history, and epidemiological information. The expected result is Negative. Fact Sheet for Patients: SugarRoll.be Fact Sheet for Healthcare Providers: https://www.woods-mathews.com/ This test is not yet approved or cleared by the [place]  FDA and  has been authorized for detection and/or diagnosis of SARS-CoV-2 by FDA under an Emergency Use Authorization (EUA). This EUA will remain  in effect (meaning this test can be used) for the duration of the COVID-19 declaration under Section 56 4(b)(1) of the Act, 21 U.S.C. section 360bbb-3(b)(1), unless the authorization is terminated or revoked sooner. Performed at [facility], [place] [address] [place], [place] [address]     RADIOLOGY:  US Venous Img Lower Unilateral Right (dvt)  Result Date: 08/06/2019 CLINICAL DATA:  Right leg swelling for 1 month. EXAM: RIGHT LOWER EXTREMITY VENOUS DOPPLER ULTRASOUND TECHNIQUE: Gray-scale sonography with graded compression, as well as color Doppler and duplex ultrasound were performed to evaluate the lower extremity deep venous systems from the level of the common femoral vein and including the common femoral, femoral, profunda femoral, popliteal and calf veins including the posterior tibial, peroneal and gastrocnemius veins when visible. The superficial great saphenous vein was also interrogated. Spectral Doppler was utilized to evaluate flow at rest and with distal augmentation maneuvers in the common femoral, femoral and popliteal veins. COMPARISON:  None. FINDINGS: Contralateral Common Femoral Vein: Respiratory phasicity is normal and symmetric with the symptomatic side. No evidence of thrombus. Normal compressibility. Common Femoral Vein: No evidence of thrombus. Normal compressibility, respiratory phasicity and response to augmentation. Saphenofemoral Junction: No evidence of thrombus. Normal compressibility and flow on color Doppler imaging. Profunda Femoral Vein: No evidence of thrombus. Normal compressibility and flow on color Doppler imaging. Femoral Vein: No evidence of thrombus. Normal compressibility, respiratory phasicity and response to augmentation. Popliteal Vein: No evidence of thrombus. Normal compressibility, respiratory  phasicity and response to augmentation. Calf Veins: No evidence of thrombus. Normal compressibility and flow on color Doppler imaging. Superficial Great Saphenous Vein: No evidence of thrombus. Normal compressibility. Venous Reflux:  None. Other Findings: Complex fluid collection in the right popliteal fossa measuring 8.1 2.9 x 2.5 cm, likely ossified intra-articular bodies. IMPRESSION: 1. No evidence of right lower extremity DVT. 2. Complex fluid collection in the right popliteal fossa likely Baker cyst containing probable intra-articular bodies. Electronically Signed   By: Keith Rake M.D.   On: 08/06/2019 04:34   Dg Chest Portable 1 View  Result Date: 08/05/2019 CLINICAL DATA:  Left side chest pain EXAM: PORTABLE CHEST 1 VIEW COMPARISON:  07/06/2019 FINDINGS: Cardiomegaly with vascular congestion. Diffuse interstitial prominence likely reflects  interstitial edema. No confluent opacities or effusions. Aortic atherosclerosis. No acute bony abnormality. IMPRESSION: Cardiomegaly, suspect mild interstitial edema. Electronically Signed   By: Rolm Baptise M.D.   On: 08/05/2019 21:56     CODE STATUS:     Code Status Orders  (From admission, onward)         Start     Ordered   08/06/19 0058  Do not attempt resuscitation (DNR)  Continuous    Question Answer Comment  In the event of cardiac or respiratory ARREST Do not call a "code blue"   In the event of cardiac or respiratory ARREST Do not perform Intubation, CPR, defibrillation or ACLS   In the event of cardiac or respiratory ARREST Use medication by any route, position, wound care, and other measures to relive pain and suffering. May use oxygen, suction and manual treatment of airway obstruction as needed for comfort.   Comments nurse may pronounce      08/06/19 0101        Code Status History    Date Active Date Inactive Code Status Order ID Comments User Context   07/06/2019 1351 07/08/2019 1714 DNR [id number]  Vaughan Basta, MD ED   06/19/2018 0854 06/21/2018 2113 DNR [id number]  Arta Silence, MD ED   05/29/2018 0618 05/30/2018 2328 DNR [id number]  Harrie Foreman, MD ED   04/18/2018 0250 04/19/2018 1925 DNR [id number]  Harrie Foreman, MD ED   03/23/2018 0955 03/23/2018 2048 DNR [id number]  Loletha Grayer, MD Inpatient   03/23/2018 0500 03/23/2018 0955 Full Code [id number]  Arta Silence, MD Inpatient   03/20/2018 0605 03/21/2018 1313 Full Code [id number]  Harrie Foreman, MD Inpatient   03/02/2018 0313 03/03/2018 1952 Full Code [id number]  Lance Coon, MD ED   02/26/2018 0716 02/27/2018 1821 Full Code [id number]  Arta Silence, MD Inpatient   01/11/2018 2030 01/14/2018 1509 Full Code [id number]  Saundra Shelling, MD Inpatient   12/25/2017 0835 12/25/2017 1743 Full Code [id number]  Teodoro Spray, MD Inpatient   12/23/2017 0927 12/25/2017 0834 Full Code [id number]  Harrie Foreman, MD Inpatient   12/08/2017 0357 12/08/2017 2344 Full Code [id number]  Harrie Foreman, MD Inpatient   06/28/2017 0749 07/01/2017 2218 Full Code [id number]  Harrie Foreman, MD Inpatient   07/19/2016 0857 07/19/2016 1319 Full Code [id number]  Corey Skains, MD Inpatient   07/05/2016 2236 07/09/2016 1950 Full Code [id number]  Harvie Bridge, DO Inpatient   12/17/2015 [id number] 12/20/2015 1957 Full Code [id number]  Saundra Shelling, MD Inpatient   Advance Care Planning Activity      TOTAL TIME TAKING CARE OF THIS PATIENT: *40* minutes.    Fritzi Mandes M.D on 08/07/2019 at 10:32 AM  Between 7am to 6pm - Pager - [phone] After 6pm go to [email or web address] - password [email or web address]  [facility]    CC: Primary care physician; [facility]

## 2019-08-07 NOTE — Progress Notes (Signed)
[facility]  Date: 08/07/19 Time: 12:58 AM  I was contacted by Ms. Ouma regarding this patient, who was admitted with chest pain and seen by [facility] yesterday.  She had recurrent chest pain this evening with EKG showing widespread ST changes.  She has a history of multivessel CAD, with most recent cath in 2019 by Dr. Nehemiah Massed showing severe mid/distal LAD and OM1 disease as well as CTO of the RCA.  LIMA to LAD is occluded.  Proximal LAD stent(s) are patent with mild ISR.  Redo CABG was recommended but the patient reportedly declined.  EKG shows NSR with widespread ST depression and mild ST elevation in aVR.  Changes are more pronounced that yesterday.  However, EKG on 05/29/2018 showed even more severe ST segment changes leading up to cath noted above on 05/30/2018.  Medical management was recommended at that time.  Ms. Stark Klein reports that the patient's chest pain has improved with SL NTG x 1 and IV morphine.  The EKG does not meet STEMI criteria.  Give some improvement with initial medical management, I recommend more aggressive medical management and consultation with [facility] cardiology.  If she has refractory chest pain despite maximal medical therapy or ST elevation develops (other than aVR), the STEMI team should be contacted to reevaluate for emergent cardiac catheterization, though on review of her most recent catheterization, PCI would be very challenging given severe coronary tortuosity and extensive calcification.  Nelva Bush, MD [facility] Pager: [phone]

## 2019-08-13 ENCOUNTER — Inpatient Hospital Stay
Admission: EM | Admit: 2019-08-13 | Discharge: 2019-09-20 | DRG: 207 | Disposition: E | Payer: Medicare Other | Attending: Pulmonary Disease | Admitting: Pulmonary Disease

## 2019-08-13 ENCOUNTER — Inpatient Hospital Stay: Payer: Medicare Other

## 2019-08-13 ENCOUNTER — Emergency Department: Payer: Medicare Other

## 2019-08-13 ENCOUNTER — Other Ambulatory Visit: Payer: Self-pay

## 2019-08-13 DIAGNOSIS — A419 Sepsis, unspecified organism: Secondary | ICD-10-CM | POA: Diagnosis not present

## 2019-08-13 DIAGNOSIS — E782 Mixed hyperlipidemia: Secondary | ICD-10-CM | POA: Diagnosis present

## 2019-08-13 DIAGNOSIS — J15212 Pneumonia due to Methicillin resistant Staphylococcus aureus: Secondary | ICD-10-CM | POA: Diagnosis present

## 2019-08-13 DIAGNOSIS — N2581 Secondary hyperparathyroidism of renal origin: Secondary | ICD-10-CM | POA: Diagnosis present

## 2019-08-13 DIAGNOSIS — I2510 Atherosclerotic heart disease of native coronary artery without angina pectoris: Secondary | ICD-10-CM | POA: Diagnosis present

## 2019-08-13 DIAGNOSIS — K219 Gastro-esophageal reflux disease without esophagitis: Secondary | ICD-10-CM | POA: Diagnosis present

## 2019-08-13 DIAGNOSIS — E1122 Type 2 diabetes mellitus with diabetic chronic kidney disease: Secondary | ICD-10-CM | POA: Diagnosis present

## 2019-08-13 DIAGNOSIS — Z96653 Presence of artificial knee joint, bilateral: Secondary | ICD-10-CM | POA: Diagnosis present

## 2019-08-13 DIAGNOSIS — I5021 Acute systolic (congestive) heart failure: Secondary | ICD-10-CM | POA: Diagnosis not present

## 2019-08-13 DIAGNOSIS — F17210 Nicotine dependence, cigarettes, uncomplicated: Secondary | ICD-10-CM | POA: Diagnosis present

## 2019-08-13 DIAGNOSIS — J9620 Acute and chronic respiratory failure, unspecified whether with hypoxia or hypercapnia: Secondary | ICD-10-CM

## 2019-08-13 DIAGNOSIS — N186 End stage renal disease: Secondary | ICD-10-CM

## 2019-08-13 DIAGNOSIS — J441 Chronic obstructive pulmonary disease with (acute) exacerbation: Secondary | ICD-10-CM | POA: Diagnosis present

## 2019-08-13 DIAGNOSIS — I252 Old myocardial infarction: Secondary | ICD-10-CM | POA: Diagnosis not present

## 2019-08-13 DIAGNOSIS — E7800 Pure hypercholesterolemia, unspecified: Secondary | ICD-10-CM | POA: Diagnosis present

## 2019-08-13 DIAGNOSIS — J810 Acute pulmonary edema: Secondary | ICD-10-CM

## 2019-08-13 DIAGNOSIS — Z7951 Long term (current) use of inhaled steroids: Secondary | ICD-10-CM

## 2019-08-13 DIAGNOSIS — R000 Tachycardia, unspecified: Secondary | ICD-10-CM | POA: Diagnosis not present

## 2019-08-13 DIAGNOSIS — Z7982 Long term (current) use of aspirin: Secondary | ICD-10-CM

## 2019-08-13 DIAGNOSIS — I5023 Acute on chronic systolic (congestive) heart failure: Secondary | ICD-10-CM | POA: Diagnosis present

## 2019-08-13 DIAGNOSIS — Z79899 Other long term (current) drug therapy: Secondary | ICD-10-CM

## 2019-08-13 DIAGNOSIS — Z452 Encounter for adjustment and management of vascular access device: Secondary | ICD-10-CM

## 2019-08-13 DIAGNOSIS — J9602 Acute respiratory failure with hypercapnia: Secondary | ICD-10-CM | POA: Diagnosis present

## 2019-08-13 DIAGNOSIS — Z66 Do not resuscitate: Secondary | ICD-10-CM | POA: Diagnosis present

## 2019-08-13 DIAGNOSIS — J9601 Acute respiratory failure with hypoxia: Secondary | ICD-10-CM | POA: Diagnosis present

## 2019-08-13 DIAGNOSIS — I214 Non-ST elevation (NSTEMI) myocardial infarction: Secondary | ICD-10-CM | POA: Diagnosis present

## 2019-08-13 DIAGNOSIS — R0602 Shortness of breath: Secondary | ICD-10-CM | POA: Diagnosis present

## 2019-08-13 DIAGNOSIS — R9389 Abnormal findings on diagnostic imaging of other specified body structures: Secondary | ICD-10-CM

## 2019-08-13 DIAGNOSIS — Z992 Dependence on renal dialysis: Secondary | ICD-10-CM

## 2019-08-13 DIAGNOSIS — Z20828 Contact with and (suspected) exposure to other viral communicable diseases: Secondary | ICD-10-CM | POA: Diagnosis present

## 2019-08-13 DIAGNOSIS — Z8673 Personal history of transient ischemic attack (TIA), and cerebral infarction without residual deficits: Secondary | ICD-10-CM | POA: Diagnosis not present

## 2019-08-13 DIAGNOSIS — Z7189 Other specified counseling: Secondary | ICD-10-CM | POA: Diagnosis not present

## 2019-08-13 DIAGNOSIS — I132 Hypertensive heart and chronic kidney disease with heart failure and with stage 5 chronic kidney disease, or end stage renal disease: Secondary | ICD-10-CM | POA: Diagnosis present

## 2019-08-13 DIAGNOSIS — R6521 Severe sepsis with septic shock: Secondary | ICD-10-CM | POA: Diagnosis not present

## 2019-08-13 DIAGNOSIS — J9690 Respiratory failure, unspecified, unspecified whether with hypoxia or hypercapnia: Secondary | ICD-10-CM | POA: Diagnosis present

## 2019-08-13 DIAGNOSIS — I469 Cardiac arrest, cause unspecified: Secondary | ICD-10-CM | POA: Diagnosis not present

## 2019-08-13 DIAGNOSIS — D631 Anemia in chronic kidney disease: Secondary | ICD-10-CM | POA: Diagnosis present

## 2019-08-13 DIAGNOSIS — Z951 Presence of aortocoronary bypass graft: Secondary | ICD-10-CM

## 2019-08-13 DIAGNOSIS — E1165 Type 2 diabetes mellitus with hyperglycemia: Secondary | ICD-10-CM | POA: Diagnosis present

## 2019-08-13 DIAGNOSIS — Z515 Encounter for palliative care: Secondary | ICD-10-CM | POA: Diagnosis not present

## 2019-08-13 DIAGNOSIS — G4733 Obstructive sleep apnea (adult) (pediatric): Secondary | ICD-10-CM | POA: Diagnosis present

## 2019-08-13 DIAGNOSIS — Z0189 Encounter for other specified special examinations: Secondary | ICD-10-CM

## 2019-08-13 DIAGNOSIS — J9600 Acute respiratory failure, unspecified whether with hypoxia or hypercapnia: Secondary | ICD-10-CM

## 2019-08-13 DIAGNOSIS — Z8249 Family history of ischemic heart disease and other diseases of the circulatory system: Secondary | ICD-10-CM

## 2019-08-13 DIAGNOSIS — E1140 Type 2 diabetes mellitus with diabetic neuropathy, unspecified: Secondary | ICD-10-CM | POA: Diagnosis present

## 2019-08-13 DIAGNOSIS — N390 Urinary tract infection, site not specified: Secondary | ICD-10-CM | POA: Diagnosis not present

## 2019-08-13 DIAGNOSIS — I255 Ischemic cardiomyopathy: Secondary | ICD-10-CM | POA: Diagnosis not present

## 2019-08-13 DIAGNOSIS — I499 Cardiac arrhythmia, unspecified: Secondary | ICD-10-CM | POA: Diagnosis not present

## 2019-08-13 DIAGNOSIS — Z955 Presence of coronary angioplasty implant and graft: Secondary | ICD-10-CM

## 2019-08-13 DIAGNOSIS — Z7902 Long term (current) use of antithrombotics/antiplatelets: Secondary | ICD-10-CM

## 2019-08-13 DIAGNOSIS — I361 Nonrheumatic tricuspid (valve) insufficiency: Secondary | ICD-10-CM | POA: Diagnosis not present

## 2019-08-13 LAB — BLOOD GAS, ARTERIAL
Acid-base deficit: 3.2 mmol/L — ABNORMAL HIGH (ref 0.0–2.0)
Bicarbonate: 23.6 mmol/L (ref 20.0–28.0)
FIO2: 0.4
MECHVT: 400 mL
O2 Saturation: 93.2 %
PEEP: 8 cmH2O
Patient temperature: 37
RATE: 30 resp/min
pCO2 arterial: 48 mmHg (ref 32.0–48.0)
pH, Arterial: 7.3 — ABNORMAL LOW (ref 7.350–7.450)
pO2, Arterial: 75 mmHg — ABNORMAL LOW (ref 83.0–108.0)

## 2019-08-13 LAB — URINALYSIS, COMPLETE (UACMP) WITH MICROSCOPIC
Bilirubin Urine: NEGATIVE
Glucose, UA: NEGATIVE mg/dL
Ketones, ur: NEGATIVE mg/dL
Nitrite: POSITIVE — AB
Protein, ur: 100 mg/dL — AB
Specific Gravity, Urine: 1.01 (ref 1.005–1.030)
WBC, UA: >50 WBC/hpf — ABNORMAL HIGH (ref 0–5)
pH: 6 (ref 5.0–8.0)

## 2019-08-13 LAB — GLUCOSE, CAPILLARY: Glucose-Capillary: 106 mg/dL — ABNORMAL HIGH (ref 70–99)

## 2019-08-13 LAB — BASIC METABOLIC PANEL
Anion gap: 16 — ABNORMAL HIGH (ref 5–15)
BUN: 49 mg/dL — ABNORMAL HIGH (ref 8–23)
CO2: 21 mmol/L — ABNORMAL LOW (ref 22–32)
Calcium: 7.9 mg/dL — ABNORMAL LOW (ref 8.9–10.3)
Chloride: 98 mmol/L (ref 98–111)
Creatinine, Ser: 9.52 mg/dL — ABNORMAL HIGH (ref 0.44–1.00)
GFR calc Af Amer: 4 mL/min — ABNORMAL LOW (ref >60)
GFR calc non Af Amer: 4 mL/min — ABNORMAL LOW (ref >60)
Glucose, Bld: 119 mg/dL — ABNORMAL HIGH (ref 70–99)
Potassium: 4.4 mmol/L (ref 3.5–5.1)
Sodium: 135 mmol/L (ref 135–145)

## 2019-08-13 LAB — CBC
HCT: 27.1 % — ABNORMAL LOW (ref 36.0–46.0)
Hemoglobin: 8.5 g/dL — ABNORMAL LOW (ref 12.0–15.0)
MCH: 32.2 pg (ref 26.0–34.0)
MCHC: 31.4 g/dL (ref 30.0–36.0)
MCV: 102.7 fL — ABNORMAL HIGH (ref 80.0–100.0)
Platelets: 212 10*3/uL (ref 150–400)
RBC: 2.64 MIL/uL — ABNORMAL LOW (ref 3.87–5.11)
RDW: 14.4 % (ref 11.5–15.5)
WBC: 9.1 10*3/uL (ref 4.0–10.5)
nRBC: 0 % (ref 0.0–0.2)

## 2019-08-13 LAB — TROPONIN I (HIGH SENSITIVITY)
Troponin I (High Sensitivity): 25 ng/L — ABNORMAL HIGH (ref <18)
Troponin I (High Sensitivity): 39 ng/L — ABNORMAL HIGH (ref <18)

## 2019-08-13 LAB — BLOOD GAS, VENOUS
Acid-base deficit: 8.6 mmol/L — ABNORMAL HIGH (ref 0.0–2.0)
Bicarbonate: 20.7 mmol/L (ref 20.0–28.0)
O2 Saturation: 63.1 %
Patient temperature: 37
pCO2, Ven: 58 mmHg (ref 44.0–60.0)
pH, Ven: 7.16 — CL (ref 7.250–7.430)
pO2, Ven: 43 mmHg (ref 32.0–45.0)

## 2019-08-13 LAB — SARS CORONAVIRUS 2 BY RT PCR (HOSPITAL ORDER, PERFORMED IN ~~LOC~~ HOSPITAL LAB): SARS Coronavirus 2: NEGATIVE

## 2019-08-13 MED ORDER — ONDANSETRON HCL 4 MG/2ML IJ SOLN: 4.0000 mg | Freq: Four times a day (QID) | INTRAMUSCULAR | Status: DC | PRN

## 2019-08-13 MED ORDER — MIDAZOLAM HCL 2 MG/2ML IJ SOLN
1.0000 mg | INTRAMUSCULAR | Status: DC | PRN
  Administered 2019-08-16: 1 mg via INTRAVENOUS
  Filled 2019-08-13: qty 2

## 2019-08-13 MED ORDER — CHLORHEXIDINE GLUCONATE CLOTH 2 % EX PADS
6.0000 | MEDICATED_PAD | Freq: Every day | CUTANEOUS | Status: DC
  Administered 2019-08-14 – 2019-08-22 (×9): 6 via TOPICAL
  Filled 2019-08-13: qty 6

## 2019-08-13 MED ORDER — ROCURONIUM BROMIDE 50 MG/5ML IV SOLN
80.0000 mg | Freq: Once | INTRAVENOUS | Status: AC
  Administered 2019-08-13: 80 mg via INTRAVENOUS
  Filled 2019-08-13: qty 8

## 2019-08-13 MED ORDER — IPRATROPIUM-ALBUTEROL 0.5-2.5 (3) MG/3ML IN SOLN
3.0000 mL | RESPIRATORY_TRACT | Status: DC
  Administered 2019-08-13 – 2019-08-17 (×23): 3 mL via RESPIRATORY_TRACT
  Filled 2019-08-13 (×23): qty 3

## 2019-08-13 MED ORDER — FAMOTIDINE IN NACL 20-0.9 MG/50ML-% IV SOLN: 20.0000 mg | Freq: Two times a day (BID) | INTRAVENOUS | Status: DC

## 2019-08-13 MED ORDER — SODIUM CHLORIDE 0.9 % IV SOLN
250.0000 mL | INTRAVENOUS | Status: DC | PRN
  Administered 2019-08-13 – 2019-08-19 (×3): 250 mL via INTRAVENOUS

## 2019-08-13 MED ORDER — SODIUM CHLORIDE 0.9 % IV SOLN
1.0000 g | Freq: Once | INTRAVENOUS | Status: AC
  Administered 2019-08-13: 1 g via INTRAVENOUS
  Filled 2019-08-13: qty 10

## 2019-08-13 MED ORDER — MIDAZOLAM HCL 2 MG/2ML IJ SOLN
1.0000 mg | INTRAMUSCULAR | Status: DC | PRN
  Administered 2019-08-14 – 2019-08-17 (×2): 1 mg via INTRAVENOUS
  Filled 2019-08-13 (×3): qty 2

## 2019-08-13 MED ORDER — FENTANYL BOLUS VIA INFUSION
25.0000 ug | INTRAVENOUS | Status: DC | PRN
  Administered 2019-08-15: 25 ug via INTRAVENOUS
  Filled 2019-08-13: qty 25

## 2019-08-13 MED ORDER — FENTANYL CITRATE (PF) 100 MCG/2ML IJ SOLN: 50.0000 ug | INTRAMUSCULAR | Status: DC | PRN

## 2019-08-13 MED ORDER — ACETAMINOPHEN 325 MG PO TABS
650.0000 mg | ORAL_TABLET | ORAL | Status: DC | PRN
  Administered 2019-08-19: 650 mg via ORAL
  Filled 2019-08-13: qty 2

## 2019-08-13 MED ORDER — FAMOTIDINE IN NACL 20-0.9 MG/50ML-% IV SOLN
20.0000 mg | INTRAVENOUS | Status: DC
  Administered 2019-08-13 – 2019-08-15 (×2): 20 mg via INTRAVENOUS
  Filled 2019-08-13 (×3): qty 50

## 2019-08-13 MED ORDER — FENTANYL 2500MCG IN NS 250ML (10MCG/ML) PREMIX INFUSION
25.0000 ug/h | INTRAVENOUS | Status: DC
  Administered 2019-08-13: 25 ug/h via INTRAVENOUS
  Administered 2019-08-14: 150 ug/h via INTRAVENOUS
  Administered 2019-08-15: 200 ug/h via INTRAVENOUS
  Administered 2019-08-16 – 2019-08-17 (×2): 75 ug/h via INTRAVENOUS
  Filled 2019-08-13 (×5): qty 250

## 2019-08-13 MED ORDER — METHYLPREDNISOLONE SODIUM SUCC 40 MG IJ SOLR
20.0000 mg | Freq: Two times a day (BID) | INTRAMUSCULAR | Status: DC
  Administered 2019-08-13 – 2019-08-16 (×7): 20 mg via INTRAVENOUS
  Filled 2019-08-13 (×7): qty 1

## 2019-08-13 MED ORDER — FENTANYL CITRATE (PF) 100 MCG/2ML IJ SOLN: 25.0000 ug | Freq: Once | INTRAMUSCULAR | Status: DC

## 2019-08-13 MED ORDER — SODIUM CHLORIDE 0.9% FLUSH
3.0000 mL | Freq: Two times a day (BID) | INTRAVENOUS | Status: DC
  Administered 2019-08-13 – 2019-08-21 (×14): 3 mL via INTRAVENOUS

## 2019-08-13 MED ORDER — ETOMIDATE 2 MG/ML IV SOLN
20.0000 mg | Freq: Once | INTRAVENOUS | Status: AC
  Administered 2019-08-13: 20 mg via INTRAVENOUS

## 2019-08-13 MED ORDER — SODIUM CHLORIDE 0.9% FLUSH: 3.0000 mL | INTRAVENOUS | Status: DC | PRN

## 2019-08-13 MED ORDER — HEPARIN SODIUM (PORCINE) 5000 UNIT/ML IJ SOLN
5000.0000 [IU] | Freq: Three times a day (TID) | INTRAMUSCULAR | Status: DC
  Administered 2019-08-13 – 2019-08-14 (×4): 5000 [IU] via SUBCUTANEOUS
  Filled 2019-08-13 (×4): qty 1

## 2019-08-13 MED ORDER — PROPOFOL 1000 MG/100ML IV EMUL
INTRAVENOUS | Status: AC
  Administered 2019-08-13: 50 ug/kg/min via INTRAVENOUS
  Filled 2019-08-13: qty 100

## 2019-08-13 MED ORDER — FENTANYL CITRATE (PF) 100 MCG/2ML IJ SOLN
50.0000 ug | INTRAMUSCULAR | Status: DC | PRN
  Filled 2019-08-13: qty 2

## 2019-08-13 MED ORDER — PROPOFOL 1000 MG/100ML IV EMUL
0.0000 ug/kg/min | INTRAVENOUS | Status: DC
  Administered 2019-08-13: 23:00:00 50 ug/kg/min via INTRAVENOUS
  Administered 2019-08-13: 20 ug/kg/min via INTRAVENOUS
  Administered 2019-08-14: 50 ug/kg/min via INTRAVENOUS
  Administered 2019-08-14: 40 ug/kg/min via INTRAVENOUS
  Administered 2019-08-14: 33.003 ug/kg/min via INTRAVENOUS
  Administered 2019-08-14: 50 ug/kg/min via INTRAVENOUS
  Administered 2019-08-14: 33.003 ug/kg/min via INTRAVENOUS
  Administered 2019-08-14 (×2): 50 ug/kg/min via INTRAVENOUS
  Administered 2019-08-15: 40 ug/kg/min via INTRAVENOUS
  Administered 2019-08-15: 50 ug/kg/min via INTRAVENOUS
  Administered 2019-08-15: 40 ug/kg/min via INTRAVENOUS
  Administered 2019-08-15: 50 ug/kg/min via INTRAVENOUS
  Administered 2019-08-16: 40 ug/kg/min via INTRAVENOUS
  Filled 2019-08-13 (×16): qty 100

## 2019-08-13 NOTE — ED Triage Notes (Addendum)
To ER via [facility] from home with generalized weakness and "stomach upset". Started this AM per patient. Pt dialysis patient, last treatment on Saturday. Pt states that she just feels nervous. CBG with EMS 131, VSS, febrile, 92% on RA-does not wear oxygen. Alert X 4. Pt also reports mild chest pain and sob that started today

## 2019-08-13 NOTE — ED Notes (Signed)
Pt stirring slightly and eyes opened briefly. Fentanyl and diprivan increased.

## 2019-08-13 NOTE — ED Notes (Signed)
ED TO INPATIENT HANDOFF REPORT  ED Nurse Name and Phone #:  Girtrude Enslin [phone]  S Name/Age/Gender Kimberly Shaffer 68 y.o. female Room/Bed: ED08A/ED08A  Code Status   Code Status: Full Code  Home/SNF/Other Home Patient oriented to: self, place, time and situation Is this baseline? Yes   Triage Complete: Triage complete  Chief Complaint nausea vomiting ems  Triage Note To ER via [facility] from home with generalized weakness and "stomach upset". Started this AM per patient. Pt dialysis patient, last treatment on Saturday. Pt states that she just feels nervous. CBG with EMS 131, VSS, febrile, 92% on RA-does not wear oxygen. Alert X 4. Pt also reports mild chest pain and sob that started today    Allergies Allergies  Allergen Reactions  . Nystatin Hives and Itching  . Sulfa Antibiotics Swelling, Hives and Rash  . Niaspan [Niacin] Other (See Comments) and Hives    Level of Care/Admitting Diagnosis ED Disposition    ED Disposition Condition [place]: [place] [100120]  Level of Care: ICU [6]  Covid Evaluation: Confirmed COVID Negative  [facility]: [id number]  Admitting Physician: Flora Lipps [phone]  Attending Physician: Flora Lipps [phone]  Estimated length of stay: 3 - 4 days  Certification:: I certify this patient will need inpatient services for at least 2 midnights  PT Class (Do Not Modify): Inpatient [101]  PT Acc Code (Do Not Modify): Private [1]       B Medical/Surgery History Past Medical History:  Diagnosis Date  . Asthma   . CAD (coronary artery disease)   . Chronic lower back pain   . COPD (chronic obstructive pulmonary disease) ([place])   . Depression   . Diabetes mellitus without complication (HCC)    NIDD  . H/O angina pectoris   . H/O blood clots   . Hypercholesteremia   . Hypertension   . Left rotator cuff tear   . Myocardial infarction ([place])   . Obesity   . Renal insufficiency    . Stroke ([place])   . Vertigo, aural    Past Surgical History:  Procedure Laterality Date  . A/V FISTULAGRAM Left 03/14/2017   Procedure: A/V Fistulagram;  Surgeon: Katha Cabal, MD;  Location: [place] [facility];  Service: Cardiovascular;  Laterality: Left;  . AV FISTULA PLACEMENT Left 11-04-2015  . AV FISTULA PLACEMENT Left 11/04/2015   Procedure: ARTERIOVENOUS (AV) FISTULA CREATION  ( BRACHIAL CEPHALIC );  Surgeon: Katha Cabal, MD;  Location: [facility];  Service: Vascular;  Laterality: Left;  . CARDIAC CATHETERIZATION    . CARDIAC CATHETERIZATION Left 07/19/2016   Procedure: Left Heart Cath and Coronary Angiography;  Surgeon: Corey Skains, MD;  Location: [place] [facility];  Service: Cardiovascular;  Laterality: Left;  . CORONARY ARTERY BYPASS GRAFT  2012  . CORONARY STENT PLACEMENT    . JOINT REPLACEMENT    . KNEE SURGERY Bilateral   . LEFT HEART CATH AND CORONARY ANGIOGRAPHY N/A 12/25/2017   Procedure: LEFT HEART CATH AND CORONARY ANGIOGRAPHY;  Surgeon: Teodoro Spray, MD;  Location: [place] [facility];  Service: Cardiovascular;  Laterality: N/A;  . LEFT HEART CATH AND CORONARY ANGIOGRAPHY Right 03/23/2018   Procedure: LEFT HEART CATH AND CORONARY ANGIOGRAPHY;  Surgeon: Dionisio David, MD;  Location: [place] [facility];  Service: Cardiovascular;  Laterality: Right;  . LEFT HEART CATH AND CORONARY ANGIOGRAPHY N/A 05/30/2018   Procedure: LEFT HEART CATH AND CORONARY ANGIOGRAPHY;  Surgeon: Corey Skains, MD;  Location: [place] [facility];  Service: Cardiovascular;  Laterality: N/A;  . PERIPHERAL VASCULAR CATHETERIZATION N/A 11/13/2015   Procedure: Dialysis/Perma Catheter Insertion;  Surgeon: Katha Cabal, MD;  Location: [place] [facility];  Service: Cardiovascular;  Laterality: N/A;  . PERIPHERAL VASCULAR CATHETERIZATION N/A 12/04/2015   Procedure: Dialysis/Perma Catheter Insertion;  Surgeon: Katha Cabal, MD;  Location: [place] [facility];  Service:  Cardiovascular;  Laterality: N/A;  . PERIPHERAL VASCULAR CATHETERIZATION Left 12/31/2015   Procedure: A/V Shuntogram/Fistulagram;  Surgeon: Algernon Huxley, MD;  Location: [place] [facility];  Service: Cardiovascular;  Laterality: Left;  . PERIPHERAL VASCULAR CATHETERIZATION N/A 12/31/2015   Procedure: A/V Shunt Intervention;  Surgeon: Algernon Huxley, MD;  Location: [place] [facility];  Service: Cardiovascular;  Laterality: N/A;  . PERIPHERAL VASCULAR CATHETERIZATION Left 02/25/2016   Procedure: A/V Shuntogram/Fistulagram;  Surgeon: Algernon Huxley, MD;  Location: [place] [facility];  Service: Cardiovascular;  Laterality: Left;  . PERIPHERAL VASCULAR CATHETERIZATION N/A 03/18/2016   Procedure: Dialysis/Perma Catheter Removal;  Surgeon: Katha Cabal, MD;  Location: [place] [facility];  Service: Cardiovascular;  Laterality: N/A;  . REMOVAL OF A DIALYSIS CATHETER N/A 06/30/2017   Procedure: REMOVAL OF A DIALYSIS CATHETER;  Surgeon: Katha Cabal, MD;  Location: [facility];  Service: Vascular;  Laterality: N/A;  . REPLACEMENT TOTAL KNEE BILATERAL Bilateral      A IV Location/Drains/Wounds Patient Lines/Drains/Airways Status   Active Line/Drains/Airways    Name:   Placement date:   Placement time:   Site:   Days:   Peripheral IV 08/07/2019 Right Antecubital   08/09/2019    1441    Antecubital   less than 1   Fistula / Graft Left Upper arm Arteriovenous fistula   -    -    Upper arm      Fistula / Graft Right Upper arm Arteriovenous vein graft   -    -    Upper arm      NG/OG Tube Orogastric 18 Fr. [place] mouth Xray   08/09/2019    [place] mouth   less than 1   Urethral Catheter Shavone Nevers RN Latex 16 Fr.   08/02/2019    1756    Latex   less than 1   External Urinary Catheter   08/07/19    0304    -   6   Airway 7.5 mm   07/24/2019    1610     less than 1   Incision (Closed) 07/06/19 Leg Right;Medial;Lower   07/06/19    0804     38          Intake/Output Last 24 hours No intake or output data in  the 24 hours ending 07/28/2019 1757  Labs/Imaging Results for orders placed or performed during the hospital encounter of 08/18/2019 (from the past 48 hour(s))  Basic metabolic panel     Status: Abnormal   Collection Time: 08/16/2019 10:43 AM  Result Value Ref Range   Sodium 135 135 - 145 mmol/L   Potassium 4.4 3.5 - 5.1 mmol/L   Chloride 98 98 - 111 mmol/L   CO2 21 (L) 22 - 32 mmol/L   Glucose, Bld 119 (H) 70 - 99 mg/dL   BUN 49 (H) 8 - 23 mg/dL   Creatinine, Ser 9.52 (H) 0.44 - 1.00 mg/dL   Calcium 7.9 (L) 8.9 - 10.3 mg/dL   GFR calc  non Af Amer 4 (L) >60 mL/min   GFR calc Af Amer 4 (L) >60 mL/min   Anion gap 16 (H) 5 - 15    Comment: Performed at [facility], [address] [place], [place] [address]  CBC     Status: Abnormal   Collection Time: 07/30/2019 10:43 AM  Result Value Ref Range   WBC 9.1 4.0 - 10.5 K/uL   RBC 2.64 (L) 3.87 - 5.11 MIL/uL   Hemoglobin 8.5 (L) 12.0 - 15.0 g/dL   HCT 27.1 (L) 36.0 - 46.0 %   MCV 102.7 (H) 80.0 - 100.0 fL   MCH 32.2 26.0 - 34.0 pg   MCHC 31.4 30.0 - 36.0 g/dL   RDW 14.4 11.5 - 15.5 %   Platelets 212 150 - 400 K/uL   nRBC 0.0 0.0 - 0.2 %    Comment: Performed at [facility], [address] [place], [place] [address]  Troponin I (High Sensitivity)     Status: Abnormal   Collection Time: 07/24/2019 10:43 AM  Result Value Ref Range   Troponin I (High Sensitivity) 25 (H) <18 ng/L    Comment: (NOTE) Elevated high sensitivity troponin I (hsTnI) values and significant  changes across serial measurements may suggest ACS but many other  chronic and acute conditions are known to elevate hsTnI results.  Refer to the "Links" section for chest pain algorithms and additional  guidance. Performed at [facility], [address] [place], [place] [address]   Glucose, capillary     Status: Abnormal   Collection Time: 08/08/2019 10:46 AM  Result Value Ref Range   Glucose-Capillary 106 (H) 70 - 99 mg/dL  Urinalysis,  Complete w Microscopic     Status: Abnormal   Collection Time: 07/25/2019 12:36 PM  Result Value Ref Range   Color, Urine AMBER (A) YELLOW    Comment: BIOCHEMICALS MAY BE AFFECTED BY COLOR   APPearance TURBID (A) CLEAR   Specific Gravity, Urine 1.010 1.005 - 1.030   pH 6.0 5.0 - 8.0   Glucose, UA NEGATIVE NEGATIVE mg/dL   Hgb urine dipstick MODERATE (A) NEGATIVE   Bilirubin Urine NEGATIVE NEGATIVE   Ketones, ur NEGATIVE NEGATIVE mg/dL   Protein, ur 100 (A) NEGATIVE mg/dL   Nitrite POSITIVE (A) NEGATIVE   Leukocytes,Ua LARGE (A) NEGATIVE   RBC / HPF 11-20 0 - 5 RBC/hpf   WBC, UA >50 (H) 0 - 5 WBC/hpf   Bacteria, UA RARE (A) NONE SEEN   Squamous Epithelial / LPF 0-5 0 - 5   WBC Clumps PRESENT    Mucus PRESENT     Comment: Performed at [facility], [address] [place],  [address]  SARS Coronavirus 2 by RT PCR (hospital order, performed in [place] hospital lab) Nasopharyngeal Nasopharyngeal Swab     Status: None   Collection Time: 08/14/2019 12:52 PM   Specimen: Nasopharyngeal Swab  Result Value Ref Range   SARS Coronavirus 2 NEGATIVE NEGATIVE    Comment: (NOTE) SARS-CoV-2 target nucleic acids are NOT DETECTED. The SARS-CoV-2 RNA is generally detectable in upper and lower respiratory specimens during the acute phase of infection. The lowest concentration of SARS-CoV-2 viral copies this assay can detect is 250 copies / mL. A negative result does not preclude SARS-CoV-2 infection and should not be used as the sole basis for treatment or other patient management decisions.  A negative result may occur with improper specimen collection / handling, submission of specimen other than nasopharyngeal  swab, presence of viral mutation(s) within the areas targeted by this assay, and inadequate number of viral copies (<250 copies / mL). A negative result must be combined with clinical observations, patient history, and epidemiological information. Fact Sheet for Patients:    StrictlyIdeas.no Fact Sheet for Healthcare Providers: BankingDealers.co.za This test is not yet approved or cleared  by the [place] FDA and has been authorized for detection and/or diagnosis of SARS-CoV-2 by FDA under an Emergency Use Authorization (EUA).  This EUA will remain in effect (meaning this test can be used) for the duration of the COVID-19 declaration under Section 564(b)(1) of the Act, 21 U.S.C. section 360bbb-3(b)(1), unless the authorization is terminated or revoked sooner. Performed at [facility], [place], [place] [address]   Troponin I (High Sensitivity)     Status: Abnormal   Collection Time: 08/12/2019 12:53 PM  Result Value Ref Range   Troponin I (High Sensitivity) 39 (H) <18 ng/L    Comment: (NOTE) Elevated high sensitivity troponin I (hsTnI) values and significant  changes across serial measurements may suggest ACS but many other  chronic and acute conditions are known to elevate hsTnI results.  Refer to the "Links" section for chest pain algorithms and additional  guidance. Performed at [facility], [address] [place], [place] [address]   Blood gas, venous     Status: Abnormal   Collection Time: 08/01/2019  4:07 PM  Result Value Ref Range   pH, Ven 7.16 (LL) 7.250 - 7.430    Comment: CRITICAL RESULT CALLED TO, READ BACK BY AND VERIFIED WITH:  DR JESSUP 1615 ON 08/10/2019 BY SNM    pCO2, Ven 58 44.0 - 60.0 mmHg   pO2, Ven 43.0 32.0 - 45.0 mmHg   Bicarbonate 20.7 20.0 - 28.0 mmol/L   Acid-base deficit 8.6 (H) 0.0 - 2.0 mmol/L   O2 Saturation 63.1 %   Patient temperature 37.0    Collection site VEIN    Sample type VENIPUNCTURE     Comment: Performed at [facility], [address], [place], [place] [address]   Dg Chest Portable 1 View  Result Date: 07/30/2019 CLINICAL DATA:  Post intubation. EXAM: PORTABLE CHEST 1 VIEW COMPARISON:  Chest x-ray from  same day. FINDINGS: Interval intubation with endotracheal tube in good position 2.5 cm above the carina. Unchanged cardiomegaly with diffuse interstitial opacities. New mid to lower lung patchy airspace disease, worse on the right. No pneumothorax or large pleural effusion. No acute osseous abnormality. IMPRESSION: 1. Appropriately positioned endotracheal tube. 2. Worsening pulmonary edema. Electronically Signed   By: Titus Dubin M.D.   On: 08/18/2019 16:42   Dg Chest Port 1 View  Result Date: 07/25/2019 CLINICAL DATA:  Shortness of breath. Additional history provided: Generalized weakness and stomach upset. Dialysis patient with last treatment Saturday. EXAM: PORTABLE CHEST 1 VIEW COMPARISON:  Chest radiograph 08/05/2019 FINDINGS: Stable cardiomegaly.  Aortic atherosclerosis. As before, there are bilateral interstitial opacities with a mid to lower lung predominance. No evidence of pneumothorax or sizable pleural effusion. No acute bony abnormality. IMPRESSION: Stable cardiomegaly. Bilateral interstitial pulmonary opacities are similar to exam 08/05/2019. This may reflect interstitial edema or infection. Aortic atherosclerosis. Electronically Signed   By: Kellie Simmering DO   On: 08/05/2019 12:11   Dg Abd Portable 1v  Result Date: 07/27/2019 CLINICAL DATA:  Orogastric tube placement EXAM: PORTABLE ABDOMEN - 1 VIEW COMPARISON:  06/26/2018 FINDINGS: NG tube in the body the stomach.  Normal bowel gas  pattern. Bibasilar airspace disease. IMPRESSION: NG tube in the body the stomach.  Normal bowel gas pattern. Electronically Signed   By: Franchot Gallo M.D.   On: 08/15/2019 17:39    Pending Labs Unresulted Labs (From admission, onward)    Start     Ordered   08/14/19 0500  CBC  Tomorrow morning,   STAT     07/31/2019 1650   08/14/19 [id number]  Basic metabolic panel  Tomorrow morning,   STAT     08/02/2019 1650   08/14/19 0500  Blood gas, arterial  Tomorrow morning,   STAT     08/18/2019 1650   08/12/2019 1930   Blood gas, arterial  Once,   STAT     08/02/2019 1757   08/12/2019 1649  CBC  (heparin)  Once,   STAT    Comments: Baseline for heparin therapy IF NOT ALREADY DRAWN.  Notify MD if PLT < 100 K.    08/05/2019 1650   07/22/2019 1649  Creatinine, serum  (heparin)  Once,   STAT    Comments: Baseline for heparin therapy IF NOT ALREADY DRAWN.    07/24/2019 1650   08/04/2019 1616  Triglycerides  (propofol (DIPRIVAN))  Every 72 hours,   STAT    Comments: While on propofol (DIPRIVAN)    07/30/2019 1615   08/15/2019 1338  Urine culture  Once,   STAT     08/18/2019 1337          Vitals/Pain Today's Vitals   08/19/2019 1730 08/12/2019 1745 08/17/2019 1751 08/01/2019 1754  BP: 124/67 117/67    Pulse:   76   Resp: (!) 25 (!) 25 (!) 25   Temp:      TempSrc:      SpO2:   99% 100%  Weight:      Height:      PainSc:        Isolation Precautions Airborne and Contact precautions  Medications Medications  fentaNYL (SUBLIMAZE) injection 50 mcg (has no administration in time range)  fentaNYL (SUBLIMAZE) injection 50 mcg (has no administration in time range)  propofol (DIPRIVAN) 1000 MG/100ML infusion (15 mcg/kg/min  101 kg Intravenous New Bag/Given 07/23/2019 1626)  sodium chloride flush (NS) 0.9 % injection 3 mL (has no administration in time range)  sodium chloride flush (NS) 0.9 % injection 3 mL (has no administration in time range)  0.9 %  sodium chloride infusion (has no administration in time range)  acetaminophen (TYLENOL) tablet 650 mg (has no administration in time range)  ondansetron (ZOFRAN) injection 4 mg (has no administration in time range)  heparin injection 5,000 Units (has no administration in time range)  ipratropium-albuterol (DUONEB) 0.5-2.5 (3) MG/3ML nebulizer solution 3 mL (3 mLs Nebulization Given 08/09/2019 1749)  fentaNYL (SUBLIMAZE) injection 25 mcg (has no administration in time range)  fentaNYL 2535mcg in NS 231mL (102mcg/ml) infusion-PREMIX (has no administration in time range)  fentaNYL  (SUBLIMAZE) bolus via infusion 25 mcg (has no administration in time range)  midazolam (VERSED) injection 1 mg (has no administration in time range)  midazolam (VERSED) injection 1 mg (has no administration in time range)  methylPREDNISolone sodium succinate (SOLU-MEDROL) 40 mg/mL injection 20 mg (has no administration in time range)  famotidine (PEPCID) IVPB 20 mg premix (has no administration in time range)  cefTRIAXone (ROCEPHIN) 1 g in sodium chloride 0.9 % 100 mL IVPB (0 g Intravenous Stopped 07/31/2019 1520)  etomidate (AMIDATE) injection 20 mg (20 mg Intravenous Given 08/14/2019 1607)  rocuronium (ZEMURON) injection  80 mg (80 mg Intravenous Given 08/18/2019 1608)    Mobility walks High fall risk   Focused Assessments n/a   R Recommendations: See Admitting Provider Note  Report given to:   Additional Notes: n/a

## 2019-08-13 NOTE — ED Notes (Signed)
Xray in with pt to check placement of OG placed by DR Siadecki.

## 2019-08-13 NOTE — ED Notes (Signed)
Placed on 2L [place]  

## 2019-08-13 NOTE — ED Notes (Signed)
Pt found sitting in the floor. Pt got out of bed and pulled out IV and removed O2. Pt states she cant breathe. Pt had just been sitting in bed with no respiratory distress. Pt unable to say why she got out of bed. Pt denies needing the restroom. Pt did not ring the call bell that was attached to the right side bedrail. New IV obtained in the same site as previous. Pt encouraged not to get out of bed and to call for anything she needs. Pt shakes her head yes.

## 2019-08-13 NOTE — ED Notes (Signed)
Pt is not tolerating bipap. MD aware.

## 2019-08-13 NOTE — ED Notes (Signed)
Pt laying in bed comfortably. No signs of distress at this time. Pt work of breathing appears normal and no signs of respiratory distress. Pt now asking to lay flat in bed. IV established and antibiotics started.

## 2019-08-13 NOTE — ED Notes (Signed)
IV antibiotics hung. Pt resting in bed. Pt asking to lay flat for comfort. No respiratory distress at this time. Pt currently on 3 liters.

## 2019-08-13 NOTE — ED Notes (Signed)
Pt to be intubated. MD and charge RN with this RN at bedside.

## 2019-08-13 NOTE — ED Provider Notes (Signed)
[facility] Emergency Department Provider Note   ____________________________________________   First MD Initiated Contact with Patient 07/27/2019 1143     (approximate)  I have reviewed the triage vital signs and the nursing notes.   HISTORY  Chief Complaint Weakness    HPI Kimberly Shaffer is a 68 y.o. female with past medical history of CAD status post CABG, hypertension, hyperlipidemia, diabetes, COPD on 2L [place], and ESRD on HD who presents to the ED complaining of shortness of breath and generalized weakness.  Patient reports she has been feeling generally weak with diffuse abdominal discomfort over the past couple of days.  She also developed a nonproductive cough and started feeling very short of breath overnight.  She eventually called EMS earlier today for feeling short of breath.  She chronically wears 2 L nasal cannula for COPD but states her breathing has been worse than usual.  She last received dialysis 3 days ago and was due to undergo treatment earlier today, but was unable to make it to her appointment.  She denies any fevers, chills, or sick contacts.        Past Medical History:  Diagnosis Date  . Asthma   . CAD (coronary artery disease)   . Chronic lower back pain   . COPD (chronic obstructive pulmonary disease) ([place])   . Depression   . Diabetes mellitus without complication (HCC)    NIDD  . H/O angina pectoris   . H/O blood clots   . Hypercholesteremia   . Hypertension   . Left rotator cuff tear   . Myocardial infarction ([place])   . Obesity   . Renal insufficiency   . Stroke ([place])   . Vertigo, aural     Patient Active Problem List   Diagnosis Date Noted  . Unstable angina ([place])   . Chest pain 07/06/2019  . Acute on chronic respiratory failure with hypoxemia ([place]) 06/19/2018  . Acute respiratory failure ([place]) 06/19/2018  . Acute on chronic congestive heart failure ([place])   . ESRD on hemodialysis ([place])   . Palliative care by  specialist   . Acute respiratory failure with hypoxemia ([place]) 04/18/2018  . Anemia of chronic disease 03/24/2018  . NSTEMI (non-ST elevated myocardial infarction) ([place]) 03/23/2018  . Acute on chronic diastolic CHF (congestive heart failure) ([place]) 03/02/2018  . GERD (gastroesophageal reflux disease) 03/02/2018  . Respiratory failure ([place]) 01/11/2018  . Abdominal pain 06/28/2017  . Anxiety, generalized 06/27/2017  . COPD (chronic obstructive pulmonary disease) ([place]) 06/26/2017  . Peritonitis ([place]) 05/28/2017  . Bilateral carotid artery stenosis 03/08/2017  . ESRD on dialysis ([place]) 03/08/2017  . Non-ST elevation myocardial infarction (NSTEMI), subendocardial infarction, subsequent episode of care ([place]) 11/04/2016  . Onychogryphosis 10/31/2016  . Onychomycosis 10/31/2016  . Subungual exostosis 10/31/2016  . Type 2 diabetes mellitus with diabetic neuropathy ([place]) 10/31/2016  . Chronic osteomyelitis of left hand ([place]) 09/03/2016  . Left arm swelling 08/30/2016  . Pre-operative clearance 08/30/2016  . Coronary artery disease of native heart with stable angina pectoris ([place]) 08/03/2016  . Benign paroxysmal positional vertigo due to bilateral vestibular disorder 07/26/2016  . H/O: CVA (cerebrovascular accident) 07/26/2016  . Stable angina ([place]) 07/05/2016  . Sepsis due to pneumonia ([place]) 07/05/2016  . Fracture of left foot 04/26/2016  . Pre-transplant evaluation for kidney transplant 02/04/2016  . Chest pain at rest 12/17/2015  . Ischemic chest pain ([place]) 12/17/2015  . DJD of shoulder 10/21/2015  . Perianal lesion 05/20/2015  . Total  knee replacement status 05/14/2015  . Lumbar radiculopathy 05/14/2015  . 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 Benign essential hypertension 04/22/2015  . Grief 03/18/2015  . Lumbosacral facet joint syndrome 02/16/2015  . Greater trochanteric bursitis 02/16/2015  . DDD (degenerative disc disease), lumbosacral 01/20/2015  . DJD (degenerative joint  disease) of knee total knee replacement 01/20/2015  . Osteoarthritis 01/20/2015  . IDA (iron deficiency anemia) 03/25/2014  . Thyroid nodule 01/15/2014  . Lung nodule 08/28/2013  . Tobacco abuse 05/17/2013  . Mixed hyperlipidemia 06/14/2011  . OSA on CPAP 06/14/2011  . PAD (peripheral artery disease) ([place]) 06/14/2011  . Depression 05/05/2011  . Chronic kidney disease (CKD), stage V ([place]) 03/26/2011  . Multiple vessel coronary artery disease 03/26/2011  . Obesity, unspecified 03/26/2011  . Type 2 diabetes mellitus ([place]) 03/26/2011    Past Surgical History:  Procedure Laterality Date  . A/V FISTULAGRAM Left 03/14/2017   Procedure: A/V Fistulagram;  Surgeon: Katha Cabal, MD;  Location: [place] [facility];  Service: Cardiovascular;  Laterality: Left;  . AV FISTULA PLACEMENT Left 11-04-2015  . AV FISTULA PLACEMENT Left 11/04/2015   Procedure: ARTERIOVENOUS (AV) FISTULA CREATION  ( BRACHIAL CEPHALIC );  Surgeon: Katha Cabal, MD;  Location: [facility];  Service: Vascular;  Laterality: Left;  . CARDIAC CATHETERIZATION    . CARDIAC CATHETERIZATION Left 07/19/2016   Procedure: Left Heart Cath and Coronary Angiography;  Surgeon: Corey Skains, MD;  Location: [place] [facility];  Service: Cardiovascular;  Laterality: Left;  . CORONARY ARTERY BYPASS GRAFT  2012  . CORONARY STENT PLACEMENT    . JOINT REPLACEMENT    . KNEE SURGERY Bilateral   . LEFT HEART CATH AND CORONARY ANGIOGRAPHY N/A 12/25/2017   Procedure: LEFT HEART CATH AND CORONARY ANGIOGRAPHY;  Surgeon: Teodoro Spray, MD;  Location: [place] [facility];  Service: Cardiovascular;  Laterality: N/A;  . LEFT HEART CATH AND CORONARY ANGIOGRAPHY Right 03/23/2018   Procedure: LEFT HEART CATH AND CORONARY ANGIOGRAPHY;  Surgeon: Dionisio David, MD;  Location: [place] [facility];  Service: Cardiovascular;  Laterality: Right;  . LEFT HEART CATH AND CORONARY ANGIOGRAPHY N/A 05/30/2018   Procedure: LEFT HEART CATH AND CORONARY  ANGIOGRAPHY;  Surgeon: Corey Skains, MD;  Location: [place] [facility];  Service: Cardiovascular;  Laterality: N/A;  . PERIPHERAL VASCULAR CATHETERIZATION N/A 11/13/2015   Procedure: Dialysis/Perma Catheter Insertion;  Surgeon: Katha Cabal, MD;  Location: [place] [facility];  Service: Cardiovascular;  Laterality: N/A;  . PERIPHERAL VASCULAR CATHETERIZATION N/A 12/04/2015   Procedure: Dialysis/Perma Catheter Insertion;  Surgeon: Katha Cabal, MD;  Location: [place] [facility];  Service: Cardiovascular;  Laterality: N/A;  . PERIPHERAL VASCULAR CATHETERIZATION Left 12/31/2015   Procedure: A/V Shuntogram/Fistulagram;  Surgeon: Algernon Huxley, MD;  Location: [place] [facility];  Service: Cardiovascular;  Laterality: Left;  . PERIPHERAL VASCULAR CATHETERIZATION N/A 12/31/2015   Procedure: A/V Shunt Intervention;  Surgeon: Algernon Huxley, MD;  Location: [facility];  Service: Cardiovascular;  Laterality: N/A;  . PERIPHERAL VASCULAR CATHETERIZATION Left 02/25/2016   Procedure: A/V Shuntogram/Fistulagram;  Surgeon: Algernon Huxley, MD;  Location: [facility];  Service: Cardiovascular;  Laterality: Left;  . PERIPHERAL VASCULAR CATHETERIZATION N/A 03/18/2016   Procedure: Dialysis/Perma Catheter Removal;  Surgeon: Katha Cabal, MD;  Location: [place] [facility];  Service: Cardiovascular;  Laterality: N/A;  . REMOVAL OF A DIALYSIS CATHETER N/A 06/30/2017   Procedure: REMOVAL OF A DIALYSIS CATHETER;  Surgeon: Katha Cabal, MD;  Location: [facility];  Service: Vascular;  Laterality: N/A;  . REPLACEMENT TOTAL KNEE BILATERAL Bilateral     Prior to Admission medications   Medication Sig Start Date End Date Taking? Authorizing Provider  acetaminophen (TYLENOL) 325 MG tablet Take 2 tablets (650 mg total) by mouth every 6 (six) hours as needed for mild pain (or Fever >/= 101). 05/30/18   Awilda Bill, NP  albuterol (PROVENTIL) (2.5 MG/3ML) 0.083% nebulizer solution Take 3 mLs  (2.5 mg total) by nebulization every 4 (four) hours as needed for wheezing or shortness of breath. 05/30/18   Awilda Bill, NP  albuterol (VENTOLIN HFA) 108 (90 Base) MCG/ACT inhaler Inhale 1 puff into the lungs every 6 (six) hours as needed for wheezing or shortness of breath.    [provider]  amLODipine (NORVASC) 10 MG tablet Take 10 mg by mouth daily.    [provider]  aspirin 81 MG chewable tablet Chew 81 mg by mouth daily.    [provider]  atorvastatin (LIPITOR) 80 MG tablet Take 80 mg by mouth at bedtime.  08/05/19   [provider]  calcium carbonate (TUMS - DOSED IN MG ELEMENTAL CALCIUM) 500 MG chewable tablet Chew 2 tablets by mouth 3 (three) times daily.    [provider]  cetirizine (ZYRTEC) 10 MG tablet Take 10 mg by mouth daily.    [provider]  cinacalcet (SENSIPAR) 30 MG tablet Take 1 tablet (30 mg total) by mouth daily before breakfast. 05/31/18   Awilda Bill, NP  clopidogrel (PLAVIX) 75 MG tablet Take 1 tablet (75 mg total) by mouth daily. 05/31/18   Awilda Bill, NP  fluticasone (FLONASE) 50 MCG/ACT nasal spray Place 2 sprays into both nostrils 2 (two) times daily.    [provider]  fluticasone furoate-vilanterol (BREO ELLIPTA) 200-25 MCG/INH AEPB Inhale 1 puff into the lungs daily. 05/31/18   Awilda Bill, NP  folic acid (FOLVITE) 1 MG tablet Take 1 mg by mouth daily.    [provider]  isosorbide mononitrate (IMDUR) 30 MG 24 hr tablet Take 30 mg by mouth daily.    [provider]  lidocaine-prilocaine (EMLA) cream Apply 1 application topically as needed (topical anesthesia for hemodialysis if Gebauers and Lidocaine injection are ineffective.). 05/30/18   Awilda Bill, NP  meclizine (ANTIVERT) 12.5 MG tablet Take 12.5 mg by mouth 3 (three) times daily as needed for dizziness.     [provider]  metoprolol succinate (TOPROL-XL) 100 MG 24 hr tablet Take 1 tablet  (100 mg total) by mouth daily with breakfast. Take with or immediately following a meal. 05/31/18   Awilda Bill, NP  mirtazapine (REMERON) 7.5 MG tablet Take 7.5 mg by mouth at bedtime. 07/31/19   [provider]  multivitamin (RENA-VIT) TABS tablet Take 1 tablet by mouth at bedtime. 05/30/18   Awilda Bill, NP  nitroGLYCERIN (NITROSTAT) 0.4 MG SL tablet Place 1 tablet (0.4 mg total) under the tongue every 5 (five) minutes as needed for chest pain. 08/07/19   Fritzi Mandes, MD  Nutritional Supplements (FEEDING SUPPLEMENT, NEPRO CARB STEADY,) LIQD Take 237 mLs by mouth daily. 08/07/19   Fritzi Mandes, MD  Oxycodone HCl 10 MG TABS Take 10 mg by mouth See admin instructions. Take 1 tablet (10mg ) by mouth 4 to 5 times daily as needed for severe pain    [provider]  pantoprazole (PROTONIX) 40 MG tablet Take  40 mg by mouth 2 (two) times daily.    [provider]  ranolazine (RANEXA) 1000 MG SR tablet Take 1 tablet (1,000 mg total) by mouth 2 (two) times daily. 08/07/19   Fritzi Mandes, MD  sevelamer carbonate (RENVELA) 800 MG tablet Take 2,400 mg by mouth 3 (three) times daily with meals.     [provider]  torsemide (DEMADEX) 100 MG tablet Take 100 mg by mouth daily.    [provider]    Allergies Nystatin, Sulfa antibiotics, and Niaspan [niacin]  Family History  Problem Relation Age of Onset  . Cancer Mother   . Cancer Father   . Diabetes Brother   . Heart disease Brother     Social History Social History   Tobacco Use  . Smoking status: Current Every Day Smoker    Packs/day: 0.25    Types: Cigarettes  . Smokeless tobacco: Never Used  Substance Use Topics  . Alcohol use: No    Alcohol/week: 0.0 standard drinks  . Drug use: No    Review of Systems  Constitutional: No fever/chills.  Positive for generalized weakness. Eyes: No visual changes. ENT: No sore throat. Cardiovascular: Denies chest pain. Respiratory: Positive for cough  and shortness of breath. Gastrointestinal: Positive for abdominal pain.  No nausea, no vomiting.  No diarrhea.  No constipation. Genitourinary: Negative for dysuria. Musculoskeletal: Negative for back pain. Skin: Negative for rash. Neurological: Negative for headaches, focal weakness or numbness.  ____________________________________________   PHYSICAL EXAM:  VITAL SIGNS: ED Triage Vitals  Enc Vitals Group     BP 07/23/2019 1036 125/68     Pulse Rate 07/24/2019 1036 85     Resp 08/11/2019 1036 18     Temp 08/12/2019 1036 98.4 F (36.9 C)     Temp Source 08/14/2019 1036 Oral     SpO2 08/09/2019 1036 92 %     Weight 08/15/2019 1037 222 lb 10.6 oz (101 kg)     Height 08/08/2019 1037 5\' 6"  (1.676 m)     Head Circumference --      Peak Flow --      Pain Score 08/07/2019 1037 0     Pain Loc --      Pain Edu? --      Excl. in [place]? --     Constitutional: Alert and oriented. Eyes: Conjunctivae are normal. Head: Atraumatic. Nose: No congestion/rhinnorhea. Mouth/Throat: Mucous membranes are moist. Neck: Normal ROM Cardiovascular: Normal rate, regular rhythm. Grossly normal heart sounds. Respiratory: Tachypneic in mild respiratory distress.  No retractions.  Crackles to bilateral bases. Gastrointestinal: Soft and nontender. No distention. Genitourinary: deferred Musculoskeletal: No lower extremity tenderness nor edema. Neurologic:  Normal speech and language. No gross focal neurologic deficits are appreciated. Skin:  Skin is warm, dry and intact. No rash noted. Psychiatric: Mood and affect are normal. Speech and behavior are normal.  ____________________________________________   LABS (all labs ordered are listed, but only abnormal results are displayed)  Labs Reviewed  BASIC METABOLIC PANEL - Abnormal; Notable for the following components:      Result Value   CO2 21 (*)    Glucose, Bld 119 (*)    BUN 49 (*)    Creatinine, Ser 9.52 (*)    Calcium 7.9 (*)    GFR calc non Af Amer 4 (*)     GFR calc Af Amer 4 (*)    Anion gap 16 (*)    All other components within normal limits  CBC - Abnormal;  Notable for the following components:   RBC 2.64 (*)    Hemoglobin 8.5 (*)    HCT 27.1 (*)    MCV 102.7 (*)    All other components within normal limits  URINALYSIS, COMPLETE (UACMP) WITH MICROSCOPIC - Abnormal; Notable for the following components:   Color, Urine AMBER (*)    APPearance TURBID (*)    Hgb urine dipstick MODERATE (*)    Protein, ur 100 (*)    Nitrite POSITIVE (*)    Leukocytes,Ua LARGE (*)    WBC, UA >50 (*)    Bacteria, UA RARE (*)    All other components within normal limits  GLUCOSE, CAPILLARY - Abnormal; Notable for the following components:   Glucose-Capillary 106 (*)    All other components within normal limits  TROPONIN I (HIGH SENSITIVITY) - Abnormal; Notable for the following components:   Troponin I (High Sensitivity) 25 (*)    All other components within normal limits  TROPONIN I (HIGH SENSITIVITY) - Abnormal; Notable for the following components:   Troponin I (High Sensitivity) 39 (*)    All other components within normal limits  SARS CORONAVIRUS 2 BY RT PCR (HOSPITAL ORDER, [facility])  URINE CULTURE  BLOOD GAS, VENOUS  TRIGLYCERIDES  CBG MONITORING, ED   ____________________________________________  EKG  ED ECG REPORT I, Blake Divine, the attending physician, personally viewed and interpreted this ECG.   Date: 08/11/2019  EKG Time: 10:38  Rate: 83  Rhythm: normal sinus rhythm  Axis: Normal  Intervals:Borderline prolonged QTc  ST&T Change: Diffuse ST depressions  ED ECG REPORT I, Blake Divine, the attending physician, personally viewed and interpreted this ECG.   Date: 07/23/2019  EKG Time: 12:00  Rate: 81  Rhythm: normal sinus rhythm  Axis: Normal  Intervals:none  ST&T Change: Diffuse ST depressions, similar to prior    PROCEDURES  Procedure(s) performed (including Critical Care):   Procedure Name: Intubation Date/Time: 08/09/2019 4:14 PM Performed by: Blake Divine, MD Pre-anesthesia Checklist: Patient identified, Patient being monitored, Emergency Drugs available, Timeout performed and Suction available Oxygen Delivery Method: Non-rebreather mask Preoxygenation: Pre-oxygenation with 100% oxygen Induction Type: Rapid sequence Ventilation: Mask ventilation without difficulty Laryngoscope Size: 4 and Glidescope Grade View: Grade II Tube size: 7.5 mm Number of attempts: 1 Airway Equipment and Method: Video-laryngoscopy Placement Confirmation: ETT inserted through vocal cords under direct vision,  CO2 detector and Breath sounds checked- equal and bilateral Secured at: 22 cm Tube secured with: ETT holder Dental Injury: Teeth and Oropharynx as per pre-operative assessment      .Critical Care Performed by: Blake Divine, MD Authorized by: Blake Divine, MD   Critical care provider statement:    Critical care time (minutes):  45   Critical care time was exclusive of:  Separately billable procedures and treating other patients and teaching time   Critical care was necessary to treat or prevent imminent or life-threatening deterioration of the following conditions:  Respiratory failure and renal failure   Critical care was time spent personally by me on the following activities:  Discussions with consultants, evaluation of patient's response to treatment, examination of patient, ordering and performing treatments and interventions, ordering and review of laboratory studies, ordering and review of radiographic studies, pulse oximetry, re-evaluation of patient's condition, obtaining history from patient or surrogate and review of old charts   I assumed direction of critical care for this patient from another provider in my specialty: no       ____________________________________________  INITIAL IMPRESSION / ASSESSMENT AND PLAN / ED COURSE        68 year old female with history of ESRD on HD presents to the ED with increasing generalized weakness and malaise over the past couple of days, with worsening cough and shortness of breath since yesterday.  She is in mild respiratory distress upon arrival, wears 2 L nasal cannula at baseline, subsequently increased to 3 L however she remains tachypneic.  Chest x-ray shows bilateral interstitial opacities, possibly representing pulmonary edema.  Given patient's respiratory status, she would benefit from hemodialysis however findings could also represent COVID-19 pneumonia.  We will need to perform rapid Covid testing to determine if patient may receive dialysis here at [facility] will need to be transferred to [facility].  Case discussed with Dr. Holley Raring of nephrology who agrees with plan.  Her labs are unremarkable however her UA also shows evidence of UTI, likely the etiology of patient's stomach discomfort.  We will treat with Rocephin.  Patient found sitting on the floor in front of her bed having removed her nasal cannula and IV, stating she cannot breathe and gasping for air.  She was repositioned on the stretcher, no signs of traumatic injury from fall.  She was subsequently placed on BiPAP but not had no improvement in her respiratory effort and was not able to tolerate BiPAP.  She subsequently agreed to intubation and was intubated without difficulty using etomidate and rocuronium.  COVID-19 testing is negative and case was discussed with intensivist, who accepts patient for admission.   ____________________________________________   FINAL CLINICAL IMPRESSION(S) / ED DIAGNOSES  Final diagnoses:  Shortness of breath  Acute pulmonary edema ([place])  ESRD on dialysis [facility])  Urinary tract infection without hematuria, site unspecified     ED Discharge Orders    None       Note:  This document was prepared using Dragon voice recognition software and may include unintentional dictation errors.    Blake Divine, MD 08/12/2019 [phone]

## 2019-08-13 NOTE — Progress Notes (Signed)
eLink Physician-Brief Progress Note Patient Name: Kimberly Shaffer DOB: 1951-01-03 MRN: [id number]   Date of Service  08/19/2019  HPI/Events of Note  Pt admitted to icu post-intubation with acute on chronic hypoxemic/ hypercapnic respiratory failure due to a combination of pulmonary edema and COPD exacerbation.  eICU Interventions  New Pt Evaluation completed.        Kerry Kass Muhammadali Ries 08/04/2019, 11:07 PM

## 2019-08-13 NOTE — ED Notes (Signed)
Unable to get urine at this time due to pt unable to tolerate.

## 2019-08-13 NOTE — ED Notes (Signed)
Respiratory in to give breathing treatment.

## 2019-08-13 NOTE — H&P (Signed)
Name: Kimberly Shaffer MRN: [id number] DOB: 1950/11/12     CONSULTATION DATE: 08/04/2019  REFERRING MD : Charna Archer  CHIEF COMPLAINT: resp failure  HISTORY OF PRESENT ILLNESS:   68 y.o. female with past medical history of CAD status post CABG, hypertension, hyperlipidemia, diabetes, COPD on 2L [place], and ESRD on HD who presents to the ED complaining of shortness of breath and generalized weakness -patient with severe hypoxia progressive resp failure placed on biPAP She has ESRD on HD last HD was Saturday Failed biPAP and now intubated and placed on MV support  COVID test NEG   SIGNIFICANT EVENTS 11/24 admitted for acute resp failure ETT intubated  PAST MEDICAL HISTORY :   has a past medical history of Asthma, CAD (coronary artery disease), Chronic lower back pain, COPD (chronic obstructive pulmonary disease) ([place]), Depression, Diabetes mellitus without complication ([place]), H/O angina pectoris, H/O blood clots, Hypercholesteremia, Hypertension, Left rotator cuff tear, Myocardial infarction ([place]), Obesity, Renal insufficiency, Stroke ([place]), and Vertigo, aural.  has a past surgical history that includes Cardiac catheterization; Coronary stent placement; Knee surgery (Bilateral); Replacement total knee bilateral (Bilateral); Coronary artery bypass graft (2012); Joint replacement; AV fistula placement (Left, 11-04-2015); AV fistula placement (Left, 11/04/2015); Cardiac catheterization (N/A, 11/13/2015); Cardiac catheterization (N/A, 12/04/2015); Cardiac catheterization (Left, 12/31/2015); Cardiac catheterization (N/A, 12/31/2015); Cardiac catheterization (Left, 02/25/2016); Cardiac catheterization (N/A, 03/18/2016); Cardiac catheterization (Left, 07/19/2016); A/V Fistulagram (Left, 03/14/2017); Removal of a dialysis catheter (N/A, 06/30/2017); LEFT HEART CATH AND CORONARY ANGIOGRAPHY (N/A, 12/25/2017); LEFT HEART CATH AND CORONARY ANGIOGRAPHY (Right, 03/23/2018); and LEFT HEART CATH AND CORONARY ANGIOGRAPHY (N/A,  05/30/2018). Prior to Admission medications   Medication Sig Start Date End Date Taking? Authorizing Provider  acetaminophen (TYLENOL) 325 MG tablet Take 2 tablets (650 mg total) by mouth every 6 (six) hours as needed for mild pain (or Fever >/= 101). 05/30/18   Awilda Bill, NP  albuterol (PROVENTIL) (2.5 MG/3ML) 0.083% nebulizer solution Take 3 mLs (2.5 mg total) by nebulization every 4 (four) hours as needed for wheezing or shortness of breath. 05/30/18   Awilda Bill, NP  albuterol (VENTOLIN HFA) 108 (90 Base) MCG/ACT inhaler Inhale 1 puff into the lungs every 6 (six) hours as needed for wheezing or shortness of breath.    [provider]  amLODipine (NORVASC) 10 MG tablet Take 10 mg by mouth daily.    [provider]  aspirin 81 MG chewable tablet Chew 81 mg by mouth daily.    [provider]  atorvastatin (LIPITOR) 80 MG tablet Take 80 mg by mouth at bedtime.  08/05/19   [provider]  calcium carbonate (TUMS - DOSED IN MG ELEMENTAL CALCIUM) 500 MG chewable tablet Chew 2 tablets by mouth 3 (three) times daily.    [provider]  cetirizine (ZYRTEC) 10 MG tablet Take 10 mg by mouth daily.    [provider]  cinacalcet (SENSIPAR) 30 MG tablet Take 1 tablet (30 mg total) by mouth daily before breakfast. 05/31/18   Awilda Bill, NP  clopidogrel (PLAVIX) 75 MG tablet Take 1 tablet (75 mg total) by mouth daily. 05/31/18   Awilda Bill, NP  fluticasone (FLONASE) 50 MCG/ACT nasal spray Place 2 sprays into both nostrils 2 (two) times daily.    [provider]  fluticasone furoate-vilanterol (BREO ELLIPTA) 200-25 MCG/INH AEPB Inhale 1 puff into the lungs daily. 05/31/18   Awilda Bill, NP  folic acid (FOLVITE) 1 MG tablet Take 1 mg by mouth daily.  [provider]  isosorbide mononitrate (IMDUR) 30 MG 24 hr tablet Take 30 mg by mouth daily.    [provider]  lidocaine-prilocaine (EMLA) cream Apply 1  application topically as needed (topical anesthesia for hemodialysis if Gebauers and Lidocaine injection are ineffective.). 05/30/18   Awilda Bill, NP  meclizine (ANTIVERT) 12.5 MG tablet Take 12.5 mg by mouth 3 (three) times daily as needed for dizziness.     [provider]  metoprolol succinate (TOPROL-XL) 100 MG 24 hr tablet Take 1 tablet (100 mg total) by mouth daily with breakfast. Take with or immediately following a meal. 05/31/18   Awilda Bill, NP  mirtazapine (REMERON) 7.5 MG tablet Take 7.5 mg by mouth at bedtime. 07/31/19   [provider]  multivitamin (RENA-VIT) TABS tablet Take 1 tablet by mouth at bedtime. 05/30/18   Awilda Bill, NP  nitroGLYCERIN (NITROSTAT) 0.4 MG SL tablet Place 1 tablet (0.4 mg total) under the tongue every 5 (five) minutes as needed for chest pain. 08/07/19   Fritzi Mandes, MD  Nutritional Supplements (FEEDING SUPPLEMENT, NEPRO CARB STEADY,) LIQD Take 237 mLs by mouth daily. 08/07/19   Fritzi Mandes, MD  Oxycodone HCl 10 MG TABS Take 10 mg by mouth See admin instructions. Take 1 tablet (10mg ) by mouth 4 to 5 times daily as needed for severe pain    [provider]  pantoprazole (PROTONIX) 40 MG tablet Take 40 mg by mouth 2 (two) times daily.    [provider]  ranolazine (RANEXA) 1000 MG SR tablet Take 1 tablet (1,000 mg total) by mouth 2 (two) times daily. 08/07/19   Fritzi Mandes, MD  sevelamer carbonate (RENVELA) 800 MG tablet Take 2,400 mg by mouth 3 (three) times daily with meals.     [provider]  torsemide (DEMADEX) 100 MG tablet Take 100 mg by mouth daily.    [provider]   Allergies  Allergen Reactions   Nystatin Hives and Itching   Sulfa Antibiotics Swelling, Hives and Rash   Niaspan [Niacin] Other (See Comments) and Hives    FAMILY HISTORY:  family history includes Cancer in her father and mother; Diabetes in her brother; Heart disease in her brother. SOCIAL HISTORY:  reports  that she has been smoking cigarettes. She has been smoking about 0.25 packs per day. She has never used smokeless tobacco. She reports that she does not drink alcohol or use drugs.  REVIEW OF SYSTEMS:   Unable to obtain due to critical illness    VITAL SIGNS: Temp:  [98.4 F (36.9 C)] 98.4 F (36.9 C) (11/24 1036) Pulse Rate:  [81-92] 92 (11/24 1541) Resp:  [18-25] 25 (11/24 1541) BP: (125-155)/(68-81) 155/81 (11/24 1200) SpO2:  [92 %-100 %] 100 % (11/24 1615) FiO2 (%):  [60 %] 60 % (11/24 1615) Weight:  [101 kg] 101 kg (11/24 1037)   No intake/output data recorded. No intake/output data recorded.   SpO2: 100 % FiO2 (%): 60 %   Physical Examination:  GENERAL:critically ill appearing resting in bed, NAD mechanically intubated  HEAD: Normocephalic, atraumatic.  EYES: Pupils equal, round, reactive to light.  No scleral icterus.  MOUTH: Moist mucosal membrane. NECK: Supple. No JVD.  PULMONARY: diffuse rhonchi throughout, even, non labored CARDIOVASCULAR: nsr, rrr, no R/G  GASTROINTESTINAL: +BS x4, obese, soft, non tender, non distended  MUSCULOSKELETAL: trace bilateral lower extremity edema, normal tone  NEUROLOGIC: sedated, not following commands, PERRL  SKIN:intact, LUE fistula + bruit and thrill   I  personally reviewed lab work that was obtained in last 24 hrs. CXR Independently reviewed-bilateral interstitial infiltrates c/w edema  MEDICATIONS: I have reviewed all medications and confirmed regimen as documented   CULTURE RESULTS   Recent Results (from the past 240 hour(s))  SARS CORONAVIRUS 2 (TAT 6-24 HRS) Nasopharyngeal Nasopharyngeal Swab     Status: None   Collection Time: 08/06/19 12:26 AM   Specimen: Nasopharyngeal Swab  Result Value Ref Range Status   SARS Coronavirus 2 NEGATIVE NEGATIVE Final    Comment: (NOTE) SARS-CoV-2 target nucleic acids are NOT DETECTED. The SARS-CoV-2 RNA is generally detectable in upper and lower respiratory specimens during  the acute phase of infection. Negative results do not preclude SARS-CoV-2 infection, do not rule out co-infections with other pathogens, and should not be used as the sole basis for treatment or other patient management decisions. Negative results must be combined with clinical observations, patient history, and epidemiological information. The expected result is Negative. Fact Sheet for Patients: SugarRoll.be Fact Sheet for Healthcare Providers: https://www.woods-mathews.com/ This test is not yet approved or cleared by the [place] FDA and  has been authorized for detection and/or diagnosis of SARS-CoV-2 by FDA under an Emergency Use Authorization (EUA). This EUA will remain  in effect (meaning this test can be used) for the duration of the COVID-19 declaration under Section 56 4(b)(1) of the Act, 21 U.S.C. section 360bbb-3(b)(1), unless the authorization is terminated or revoked sooner. Performed at [facility], [place] [address] [place], [place] [address]   SARS Coronavirus 2 by RT PCR (hospital order, performed in [facility] hospital lab) Nasopharyngeal Nasopharyngeal Swab     Status: None   Collection Time: 07/31/2019 12:52 PM   Specimen: Nasopharyngeal Swab  Result Value Ref Range Status   SARS Coronavirus 2 NEGATIVE NEGATIVE Final    Comment: (NOTE) SARS-CoV-2 target nucleic acids are NOT DETECTED. The SARS-CoV-2 RNA is generally detectable in upper and lower respiratory specimens during the acute phase of infection. The lowest concentration of SARS-CoV-2 viral copies this assay can detect is 250 copies / mL. A negative result does not preclude SARS-CoV-2 infection and should not be used as the sole basis for treatment or other patient management decisions.  A negative result may occur with improper specimen collection / handling, submission of specimen other than nasopharyngeal swab, presence of viral mutation(s) within  the areas targeted by this assay, and inadequate number of viral copies (<250 copies / mL). A negative result must be combined with clinical observations, patient history, and epidemiological information. Fact Sheet for Patients:   StrictlyIdeas.no Fact Sheet for Healthcare Providers: BankingDealers.co.za This test is not yet approved or cleared  by the [place] FDA and has been authorized for detection and/or diagnosis of SARS-CoV-2 by FDA under an Emergency Use Authorization (EUA).  This EUA will remain in effect (meaning this test can be used) for the duration of the COVID-19 declaration under Section 564(b)(1) of the Act, 21 U.S.C. section 360bbb-3(b)(1), unless the authorization is terminated or revoked sooner. Performed at [facility], [address] [place], [place] [address]           IMAGING    Dg Chest Portable 1 View  Result Date: 07/29/2019 CLINICAL DATA:  Post intubation. EXAM: PORTABLE CHEST 1 VIEW COMPARISON:  Chest x-ray from same day. FINDINGS: Interval intubation with endotracheal tube in good position 2.5 cm above the carina. Unchanged cardiomegaly with diffuse interstitial opacities. New mid to lower lung patchy airspace disease, worse  on the right. No pneumothorax or large pleural effusion. No acute osseous abnormality. IMPRESSION: 1. Appropriately positioned endotracheal tube. 2. Worsening pulmonary edema. Electronically Signed   By: Titus Dubin M.D.   On: 08/11/2019 16:42   Dg Chest Port 1 View  Result Date: 08/12/2019 CLINICAL DATA:  Shortness of breath. Additional history provided: Generalized weakness and stomach upset. Dialysis patient with last treatment Saturday. EXAM: PORTABLE CHEST 1 VIEW COMPARISON:  Chest radiograph 08/05/2019 FINDINGS: Stable cardiomegaly.  Aortic atherosclerosis. As before, there are bilateral interstitial opacities with a mid to lower lung predominance. No  evidence of pneumothorax or sizable pleural effusion. No acute bony abnormality. IMPRESSION: Stable cardiomegaly. Bilateral interstitial pulmonary opacities are similar to exam 08/05/2019. This may reflect interstitial edema or infection. Aortic atherosclerosis. Electronically Signed   By: Kellie Simmering DO   On: 07/28/2019 12:11        Ventilator continued, requirement due to severe respiratory failure   Ventilator Sedation RASS 0 to -2      ASSESSMENT AND PLAN SYNOPSIS   Severe ACUTE Hypoxic and Hypercapnic Respiratory Failure from acute pulm edema with COPD exacerabtion -continue Full MV support -continue Bronchodilator Therapy -Wean Fio2 and PEEP as tolerated Check ECHO    SEVERE COPD EXACERBATION -continue IV steroids as prescribed -continue NEB THERAPY as prescribed  ESRD Renal Failure -follow chem 7 -follow UO -continue Foley Catheter-assess need -Avoid nephrotoxic agents Follow Nephrology recs  NEUROLOGY - intubated and sedated - minimal sedation to achieve a RASS goal: -1   CARDIAC ICU monitoring  ID -continue IV abx as prescibed -follow up cultures  GI GI PROPHYLAXIS as indicated  NUTRITIONAL STATUS DIET-->NPO Constipation protocol as indicated   ENDO - will use ICU hypoglycemic\Hyperglycemia protocol if needed    ELECTROLYTES -follow labs as needed -replace as needed -pharmacy consultation and following   DVT/GI PRX ordered TRANSFUSIONS AS NEEDED MONITOR FSBS ASSESS the need for LABS    Critical Care Time devoted to patient care services described in this note is 45 minutes.   Overall, patient is critically ill, prognosis is guarded.  Patient with Multiorgan failure and at high risk for cardiac arrest and death.    Corrin Parker, M.D.  [facility]  Medical Director [place] Director [facility] Cardio-Pulmonary Department

## 2019-08-13 NOTE — ED Notes (Signed)
New bottle of Diprivan pulled as bottle is just at empty. CCu RN Judson Roch to chart the switch.

## 2019-08-13 NOTE — ED Notes (Signed)
Unable to give report X 1

## 2019-08-13 NOTE — ED Notes (Signed)
Patient is resting comfortably. 

## 2019-08-14 ENCOUNTER — Inpatient Hospital Stay (HOSPITAL_COMMUNITY)
Admit: 2019-08-14 | Discharge: 2019-08-14 | Disposition: A | Discharge status: Home / Self Care | Payer: Medicare Other | Attending: Internal Medicine | Admitting: Internal Medicine

## 2019-08-14 ENCOUNTER — Inpatient Hospital Stay: Payer: Medicare Other

## 2019-08-14 DIAGNOSIS — I499 Cardiac arrhythmia, unspecified: Secondary | ICD-10-CM

## 2019-08-14 DIAGNOSIS — I214 Non-ST elevation (NSTEMI) myocardial infarction: Secondary | ICD-10-CM

## 2019-08-14 DIAGNOSIS — I361 Nonrheumatic tricuspid (valve) insufficiency: Secondary | ICD-10-CM

## 2019-08-14 DIAGNOSIS — R000 Tachycardia, unspecified: Secondary | ICD-10-CM

## 2019-08-14 LAB — MRSA PCR SCREENING: MRSA by PCR: NEGATIVE

## 2019-08-14 LAB — CBC
HCT: 24.7 % — ABNORMAL LOW (ref 36.0–46.0)
Hemoglobin: 8.2 g/dL — ABNORMAL LOW (ref 12.0–15.0)
MCH: 32 pg (ref 26.0–34.0)
MCHC: 33.2 g/dL (ref 30.0–36.0)
MCV: 96.5 fL (ref 80.0–100.0)
Platelets: 195 10*3/uL (ref 150–400)
RBC: 2.56 MIL/uL — ABNORMAL LOW (ref 3.87–5.11)
RDW: 14.4 % (ref 11.5–15.5)
WBC: 8.8 10*3/uL (ref 4.0–10.5)
nRBC: 0 % (ref 0.0–0.2)

## 2019-08-14 LAB — BASIC METABOLIC PANEL
Anion gap: 18 — ABNORMAL HIGH (ref 5–15)
BUN: 57 mg/dL — ABNORMAL HIGH (ref 8–23)
CO2: 18 mmol/L — ABNORMAL LOW (ref 22–32)
Calcium: 7.8 mg/dL — ABNORMAL LOW (ref 8.9–10.3)
Chloride: 102 mmol/L (ref 98–111)
Creatinine, Ser: 10.24 mg/dL — ABNORMAL HIGH (ref 0.44–1.00)
GFR calc Af Amer: 4 mL/min — ABNORMAL LOW (ref >60)
GFR calc non Af Amer: 3 mL/min — ABNORMAL LOW (ref >60)
Glucose, Bld: 115 mg/dL — ABNORMAL HIGH (ref 70–99)
Potassium: 5 mmol/L (ref 3.5–5.1)
Sodium: 138 mmol/L (ref 135–145)

## 2019-08-14 LAB — APTT: aPTT: 37 seconds — ABNORMAL HIGH (ref 24–36)

## 2019-08-14 LAB — BLOOD GAS, ARTERIAL
Acid-base deficit: 4.2 mmol/L — ABNORMAL HIGH (ref 0.0–2.0)
Bicarbonate: 21 mmol/L (ref 20.0–28.0)
FIO2: 0.4
MECHVT: 400 mL
Mechanical Rate: 30
O2 Saturation: 97.9 %
PEEP: 8 cmH2O
Patient temperature: 37
pCO2 arterial: 38 mmHg (ref 32.0–48.0)
pH, Arterial: 7.35 (ref 7.350–7.450)
pO2, Arterial: 107 mmHg (ref 83.0–108.0)

## 2019-08-14 LAB — MAGNESIUM: Magnesium: 1.6 mg/dL — ABNORMAL LOW (ref 1.7–2.4)

## 2019-08-14 LAB — TROPONIN I (HIGH SENSITIVITY): Troponin I (High Sensitivity): 234 ng/L (ref <18)

## 2019-08-14 LAB — HEPATITIS B SURFACE ANTIGEN: Hepatitis B Surface Ag: NONREACTIVE

## 2019-08-14 LAB — PHOSPHORUS
Phosphorus: 6.7 mg/dL — ABNORMAL HIGH (ref 2.5–4.6)
Phosphorus: 7.4 mg/dL — ABNORMAL HIGH (ref 2.5–4.6)

## 2019-08-14 LAB — PROTIME-INR
INR: 1.1 (ref 0.8–1.2)
Prothrombin Time: 13.9 seconds (ref 11.4–15.2)

## 2019-08-14 LAB — GLUCOSE, CAPILLARY
Glucose-Capillary: 105 mg/dL — ABNORMAL HIGH (ref 70–99)
Glucose-Capillary: 87 mg/dL (ref 70–99)
Glucose-Capillary: 74 mg/dL (ref 70–99)
Glucose-Capillary: 90 mg/dL (ref 70–99)

## 2019-08-14 LAB — TRIGLYCERIDES: Triglycerides: 170 mg/dL — ABNORMAL HIGH (ref <150)

## 2019-08-14 MED ORDER — MAGNESIUM SULFATE 2 GM/50ML IV SOLN
2.0000 g | Freq: Once | INTRAVENOUS | Status: AC
  Administered 2019-08-14: 2 g via INTRAVENOUS
  Filled 2019-08-14: qty 50

## 2019-08-14 MED ORDER — CHLORHEXIDINE GLUCONATE 0.12% ORAL RINSE (MEDLINE KIT)
15.0000 mL | Freq: Two times a day (BID) | OROMUCOSAL | Status: DC
  Administered 2019-08-14 – 2019-08-21 (×15): 15 mL via OROMUCOSAL

## 2019-08-14 MED ORDER — PERFLUTREN LIPID MICROSPHERE
1.0000 mL | INTRAVENOUS | Status: AC | PRN
  Administered 2019-08-14: 2 mL via INTRAVENOUS
  Filled 2019-08-14: qty 10

## 2019-08-14 MED ORDER — HEPARIN (PORCINE) 25000 UT/250ML-% IV SOLN
1200.0000 [IU]/h | INTRAVENOUS | Status: DC
  Administered 2019-08-14 – 2019-08-15 (×2): 1000 [IU]/h via INTRAVENOUS
  Filled 2019-08-14: qty 250

## 2019-08-14 MED ORDER — ORAL CARE MOUTH RINSE
15.0000 mL | OROMUCOSAL | Status: DC
  Administered 2019-08-14 – 2019-08-21 (×71): 15 mL via OROMUCOSAL

## 2019-08-14 MED ORDER — HEPARIN BOLUS VIA INFUSION
2500.0000 [IU] | Freq: Once | INTRAVENOUS | Status: AC
  Administered 2019-08-14: 2500 [IU] via INTRAVENOUS
  Filled 2019-08-14: qty 2500

## 2019-08-14 NOTE — Progress Notes (Signed)
Silver earrings were put in a labeled specimen cup and placed in bag of patient belongings in ICU closet.  Cameron Ali, RN

## 2019-08-14 NOTE — Progress Notes (Signed)
Established hemodialysis patient known at [facility] TTS 6:30, Patient normally transports with [facility]. Please contact me directly with any dialysis placement concerns.  Elvera Bicker Dialysis Coordinator [phone]

## 2019-08-14 NOTE — Progress Notes (Addendum)
[place], [place] 08/14/19  Subjective:  Patient seen and evaluated at bedside. Currently intubated and sedated.  Seen and evaluated during dialysis.     Objective:  Vital signs in last 24 hours:  Temp:  [97.4 F (36.3 C)-97.6 F (36.4 C)] 97.6 F (36.4 C) (11/25 0940) Pulse Rate:  [44-96] 62 (11/25 1115) Resp:  [20-30] 30 (11/25 1115) BP: (94-155)/(45-96) 109/52 (11/25 1115) SpO2:  [96 %-100 %] 100 % (11/25 1132) FiO2 (%):  [25 %-100 %] 25 % (11/25 1132) Weight:  [100.1 kg] 100.1 kg (11/25 0940)  Weight change:  Filed Weights   08/18/2019 1037 08/14/19 0500 08/14/19 0940  Weight: 101 kg 100.1 kg 100.1 kg    Intake/Output:    Intake/Output Summary (Last 24 hours) at 08/14/2019 1139 Last data filed at 08/14/2019 0800 Gross per 24 hour  Intake 591.52 ml  Output 100 ml  Net 491.52 ml     Physical Exam: General:  Laying in the bed  HEENT  anicteric, moist oral mucous membranes, ETT in place  Pulm/lungs  bilateral rales and rhonchi  CVS/Heart  S1S2  Abdomen:   Soft, nontender, BS present  Extremities:  Trace pitting edema  Neurologic:  Sedated, not following commands  Skin:  No acute rashes  Access:  Left arm AV fistula       Basic Metabolic Panel:  Recent Labs  Lab 07/30/2019 1043 08/14/19 0002 08/14/19 0944  NA 135 138  --   K 4.4 5.0  --   CL 98 102  --   CO2 21* 18*  --   GLUCOSE 119* 115*  --   BUN 49* 57*  --   CREATININE 9.52* 10.24*  --   CALCIUM 7.9* 7.8*  --   MG  --  1.6*  --   PHOS  --  7.4* 6.7*     CBC: Recent Labs  Lab 08/04/2019 1043 08/14/19 0002  WBC 9.1 8.8  HGB 8.5* 8.2*  HCT 27.1* 24.7*  MCV 102.7* 96.5  PLT 212 195      Lab Results  Component Value Date   HEPBSAG Negative 01/11/2018   HEPBSAB Reactive 01/11/2018      Microbiology:  Recent Results (from the past 240 hour(s))  SARS CORONAVIRUS 2 (TAT 6-24 HRS) Nasopharyngeal Nasopharyngeal Swab     Status: None   Collection Time:  08/06/19 12:26 AM   Specimen: Nasopharyngeal Swab  Result Value Ref Range Status   SARS Coronavirus 2 NEGATIVE NEGATIVE Final    Comment: (NOTE) SARS-CoV-2 target nucleic acids are NOT DETECTED. The SARS-CoV-2 RNA is generally detectable in upper and lower respiratory specimens during the acute phase of infection. Negative results do not preclude SARS-CoV-2 infection, do not rule out co-infections with other pathogens, and should not be used as the sole basis for treatment or other patient management decisions. Negative results must be combined with clinical observations, patient history, and epidemiological information. The expected result is Negative. Fact Sheet for Patients: SugarRoll.be Fact Sheet for Healthcare Providers: https://www.woods-mathews.com/ This test is not yet approved or cleared by the [place] FDA and  has been authorized for detection and/or diagnosis of SARS-CoV-2 by FDA under an Emergency Use Authorization (EUA). This EUA will remain  in effect (meaning this test can be used) for the duration of the COVID-19 declaration under Section 56 4(b)(1) of the Act, 21 U.S.C. section 360bbb-3(b)(1), unless the authorization is terminated or revoked sooner. Performed at [facility], [place] [address],  [place], [place] [address]   SARS Coronavirus 2 by RT PCR (hospital order, performed in [facility] hospital lab) Nasopharyngeal Nasopharyngeal Swab     Status: None   Collection Time: 08/06/2019 12:52 PM   Specimen: Nasopharyngeal Swab  Result Value Ref Range Status   SARS Coronavirus 2 NEGATIVE NEGATIVE Final    Comment: (NOTE) SARS-CoV-2 target nucleic acids are NOT DETECTED. The SARS-CoV-2 RNA is generally detectable in upper and lower respiratory specimens during the acute phase of infection. The lowest concentration of SARS-CoV-2 viral copies this assay can detect is 250 copies / mL. A negative result does not  preclude SARS-CoV-2 infection and should not be used as the sole basis for treatment or other patient management decisions.  A negative result may occur with improper specimen collection / handling, submission of specimen other than nasopharyngeal swab, presence of viral mutation(s) within the areas targeted by this assay, and inadequate number of viral copies (<250 copies / mL). A negative result must be combined with clinical observations, patient history, and epidemiological information. Fact Sheet for Patients:   StrictlyIdeas.no Fact Sheet for Healthcare Providers: BankingDealers.co.za This test is not yet approved or cleared  by the [place] FDA and has been authorized for detection and/or diagnosis of SARS-CoV-2 by FDA under an Emergency Use Authorization (EUA).  This EUA will remain in effect (meaning this test can be used) for the duration of the COVID-19 declaration under Section 564(b)(1) of the Act, 21 U.S.C. section 360bbb-3(b)(1), unless the authorization is terminated or revoked sooner. Performed at [facility], [place] [place], [place] [address]   MRSA PCR Screening     Status: None   Collection Time: 08/05/2019 11:05 PM   Specimen: Nasal Mucosa; Nasopharyngeal  Result Value Ref Range Status   MRSA by PCR NEGATIVE NEGATIVE Final    Comment:        The GeneXpert MRSA Assay (FDA approved for NASAL specimens only), is one component of a comprehensive MRSA colonization surveillance program. It is not intended to diagnose MRSA infection nor to guide or monitor treatment for MRSA infections. Performed at [facility], [address] [place], [place] [address]     Coagulation Studies: No results for input(s): LABPROT, INR in the last 72 hours.  Urinalysis: Recent Labs    08/11/2019 1236  COLORURINE AMBER*  LABSPEC 1.010  PHURINE 6.0  GLUCOSEU NEGATIVE  HGBUR MODERATE*   BILIRUBINUR NEGATIVE  KETONESUR NEGATIVE  PROTEINUR 100*  NITRITE POSITIVE*  LEUKOCYTESUR LARGE*      Imaging: Dg Chest Port 1 View  Result Date: 08/14/2019 CLINICAL DATA:  Status post intubation EXAM: PORTABLE CHEST 1 VIEW COMPARISON:  Film from the previous day. FINDINGS: Endotracheal tube and gastric catheter are noted in satisfactory position. Cardiac shadow is stable. Aortic calcifications are seen. The previously seen vascular congestion and pulmonary edema has improved slightly in the interval from the prior exam. No bony abnormality is seen. IMPRESSION: Slight improvement in the degree of pulmonary edema when compare with the prior exam. Electronically Signed   By: Inez Catalina M.D.   On: 08/14/2019 03:35   Dg Chest Portable 1 View  Result Date: 08/07/2019 CLINICAL DATA:  Post intubation. EXAM: PORTABLE CHEST 1 VIEW COMPARISON:  Chest x-ray from same day. FINDINGS: Interval intubation with endotracheal tube in good position 2.5 cm above the carina. Unchanged cardiomegaly with diffuse interstitial opacities. New mid to lower lung patchy airspace disease, worse on the right. No pneumothorax or large pleural effusion. No  acute osseous abnormality. IMPRESSION: 1. Appropriately positioned endotracheal tube. 2. Worsening pulmonary edema. Electronically Signed   By: Titus Dubin M.D.   On: 07/29/2019 16:42   Dg Chest Port 1 View  Result Date: 07/22/2019 CLINICAL DATA:  Shortness of breath. Additional history provided: Generalized weakness and stomach upset. Dialysis patient with last treatment Saturday. EXAM: PORTABLE CHEST 1 VIEW COMPARISON:  Chest radiograph 08/05/2019 FINDINGS: Stable cardiomegaly.  Aortic atherosclerosis. As before, there are bilateral interstitial opacities with a mid to lower lung predominance. No evidence of pneumothorax or sizable pleural effusion. No acute bony abnormality. IMPRESSION: Stable cardiomegaly. Bilateral interstitial pulmonary opacities are similar to  exam 08/05/2019. This may reflect interstitial edema or infection. Aortic atherosclerosis. Electronically Signed   By: Kellie Simmering DO   On: 07/25/2019 12:11   Dg Abd Portable 1v  Result Date: 08/11/2019 CLINICAL DATA:  Orogastric tube placement EXAM: PORTABLE ABDOMEN - 1 VIEW COMPARISON:  06/26/2018 FINDINGS: NG tube in the body the stomach.  Normal bowel gas pattern. Bibasilar airspace disease. IMPRESSION: NG tube in the body the stomach.  Normal bowel gas pattern. Electronically Signed   By: Franchot Gallo M.D.   On: 07/24/2019 17:39     Medications:   . sodium chloride 5 mL/hr at 08/14/19 0800  . famotidine (PEPCID) IV Stopped (08/14/19 0024)  . fentaNYL infusion INTRAVENOUS 100 mcg/hr (08/14/19 0800)  . propofol (DIPRIVAN) infusion 50 mcg/kg/min (08/14/19 0922)   . chlorhexidine gluconate (MEDLINE KIT)  15 mL Mouth Rinse BID  . Chlorhexidine Gluconate Cloth  6 each Topical Q0600  . fentaNYL (SUBLIMAZE) injection  25 mcg Intravenous Once  . heparin  5,000 Units Subcutaneous Q8H  . ipratropium-albuterol  3 mL Nebulization Q4H  . mouth rinse  15 mL Mouth Rinse 10 times per day  . methylPREDNISolone (SOLU-MEDROL) injection  20 mg Intravenous Q12H  . sodium chloride flush  3 mL Intravenous Q12H   sodium chloride, acetaminophen, fentaNYL, fentaNYL (SUBLIMAZE) injection, fentaNYL (SUBLIMAZE) injection, midazolam, midazolam, ondansetron (ZOFRAN) IV, sodium chloride flush  Assessment/ Plan:  68 y.o. female with stage renal disease on hemodialysis, coronary artery disease with history of CABG 2012 and PCI, peripheral arterial disease, history of right femoral-popliteal revascularization, COPD, hypertension, hyperlipidemia, diabetes type 2, history of stroke, history of anxiety and depression, obstructive sleep apnea admitted for evaluation of chest pain  Active Problems:   Respiratory failure ([place])   #. ESRD  [facility]// TuThSa-1//TW//98kg//[facility] - Pt seen at bedside  during dialysis, tolerating well, UF target 2kg.   #. Anemia of CKD  Lab Results  Component Value Date   HGB 8.2 (L) 08/14/2019  Hgb 8.2, restart epogen with next HD.   #. SHPTH     Component Value Date/Time   PTH 472 (H) 07/06/2019 1455   Lab Results  Component Value Date   PHOS 6.7 (H) 08/14/2019  Previously on renvela.  Currently off as pt NPO.    #Acute respiratory failure. Patient on the ventilator at the moment, continue ventilatory support at the moment.      LOS: 1 Kendel Pesnell 11/25/202011:39 AM  [place], [place]

## 2019-08-14 NOTE — Progress Notes (Addendum)
Initial Nutrition Assessment  DOCUMENTATION CODES:   Obesity unspecified  INTERVENTION:   If tube feeds initiated, recommend:  Vital HP @20ml /hr + Prostat 63ml QID  Propofol: 30.3 ml/hr- provides 800kcal/day   Free water flushes 36ml q4 hours to maintain tube patency   Regimen provides 1680kcal/day, 102g/day protein, 543ml/day free water   B-Complex with C daily via tube   Liquid MVI daily via tube   NUTRITION DIAGNOSIS:   Inadequate oral intake related to inability to eat(Pt sedated and ventilated) as evidenced by NPO status.  GOAL:   Provide needs based on ASPEN/SCCM guidelines  MONITOR:   Vent status, Labs, Weight trends, TF tolerance, Skin, I & O's  REASON FOR ASSESSMENT:   Consult Enteral/tube feeding initiation and management  ASSESSMENT:   68 y.o. female with stage renal disease on hemodialysis, coronary artery disease with history of CABG 2012 and PCI, peripheral arterial disease, history of right femoral-popliteal revascularization, COPD, hypertension, hyperlipidemia, diabetes type 2, history of stroke, history of anxiety and depression, obstructive sleep apnea admitted for evaluation of chest pain. Pt found to have pulmonary edema and COPD exacerbation requiring intubation  Pt sedated and ventilated. OGT in place. No plans to initiate tube feeds today. Per chart, pt appears weight stable pta. HD today.   Medications reviewed and include: heparin, solu-medrol, pepcid, fentanyl, propofol  Labs reviewed: K 5.0 wnl, BUN 57(H), creat 10.24(H), P 6.7(H), Mg 1.6(L) Triglycerides- 170(H)\ Hgb 8.2(L), Hct 24.7(L) iPTH- 474(H)- 10/17  Patient is currently intubated on ventilator support MV: 11.1 L/min Temp (24hrs), Avg:97.7 F (36.5 C), Min:97.4 F (36.3 C), Max:98.6 F (37 C)  Propofol: 30.3 ml/hr- provides 800kcal/day   MAP- >101mmHg   UOP- 180ml  NUTRITION - FOCUSED PHYSICAL EXAM:    Most Recent Value  Orbital Region  No depletion  Upper Arm  Region  No depletion  Thoracic and Lumbar Region  No depletion  Buccal Region  No depletion  Temple Region  No depletion  Clavicle Bone Region  Mild depletion  Clavicle and Acromion Bone Region  Mild depletion  Scapular Bone Region  No depletion  Dorsal Hand  No depletion  Patellar Region  No depletion  Anterior Thigh Region  No depletion  Posterior Calf Region  No depletion  Edema (RD Assessment)  Mild  Hair  Reviewed  Eyes  Reviewed  Mouth  Reviewed  Skin  Reviewed  Nails  Reviewed     Diet Order:   Diet Order            Diet NPO time specified  Diet effective now             EDUCATION NEEDS:   No education needs have been identified at this time  Skin:  Skin Assessment: Reviewed RN Assessment(MASD)  Last BM:  pta  Height:   Ht Readings from Last 1 Encounters:  07/29/2019 5\' 6"  (1.676 m)    Weight:   Wt Readings from Last 1 Encounters:  08/14/19 100.1 kg    Ideal Body Weight:  59 kg  BMI:  Body mass index is 35.62 kg/m.  Estimated Nutritional Needs:   Kcal:  1100-1400kcal/day  Protein:  >120g/day  Fluid:  UOP +1L  Koleen Distance MS, RD, LDN Pager #- [phone] Office#- [phone] After Hours Pager: [phone]

## 2019-08-14 NOTE — Progress Notes (Signed)
CRITICAL CARE NOTE  CC  follow up respiratory failure  SUBJECTIVE Patient remains critically ill Prognosis is guarded Remains on vent   BP (!) 109/52   Pulse 71   Temp 98.9 F (37.2 C) (Oral)   Resp (!) 23   Ht 5\' 6"  (1.676 m)   Wt 100.1 kg   SpO2 100%   BMI 35.62 kg/m    I/O last 3 completed shifts: In: 458.2 [I.V.:408.3; IV Piggyback:49.9] Out: 100 [Urine:100] Total I/O In: 552.2 [I.V.:502.2; IV Piggyback:50] Out: 2500 [Other:2500]  SpO2: 100 % FiO2 (%): 25 %   SIGNIFICANT EVENTS 11/24 acute pulm edema and ETT intubation and MV support 11/25 failed SAT/SBT  Now with acute coronary syndrome c/w NSTEMI Findings reviewed with STEMI cardiologist on call-He reviewed EKG and  is c/w NSTEMI and cardiac ischemia needs anticoagulation  REVIEW OF SYSTEMS  PATIENT IS UNABLE TO PROVIDE COMPLETE REVIEW OF SYSTEMS DUE TO SEVERE CRITICAL ILLNESS   PHYSICAL EXAMINATION:  GENERAL:critically ill appearing, +resp distress HEAD: Normocephalic, atraumatic.  EYES: Pupils equal, round, reactive to light.  No scleral icterus.  MOUTH: Moist mucosal membrane. NECK: Supple.  PULMONARY: +rhonchi, +wheezing CARDIOVASCULAR: S1 and S2. Regular rate and rhythm. No murmurs, rubs, or gallops.  GASTROINTESTINAL: Soft, nontender, -distended. No masses. Positive bowel sounds. No hepatosplenomegaly.  MUSCULOSKELETAL: No swelling, clubbing, or edema.  NEUROLOGIC: obtunded, GCS<8 SKIN:intact,warm,dry  MEDICATIONS: I have reviewed all medications and confirmed regimen as documented   CULTURE RESULTS   Recent Results (from the past 240 hour(s))  SARS CORONAVIRUS 2 (TAT 6-24 HRS) Nasopharyngeal Nasopharyngeal Swab     Status: None   Collection Time: 08/06/19 12:26 AM   Specimen: Nasopharyngeal Swab  Result Value Ref Range Status   SARS Coronavirus 2 NEGATIVE NEGATIVE Final    Comment: (NOTE) SARS-CoV-2 target nucleic acids are NOT DETECTED. The SARS-CoV-2 RNA is generally detectable in  upper and lower respiratory specimens during the acute phase of infection. Negative results do not preclude SARS-CoV-2 infection, do not rule out co-infections with other pathogens, and should not be used as the sole basis for treatment or other patient management decisions. Negative results must be combined with clinical observations, patient history, and epidemiological information. The expected result is Negative. Fact Sheet for Patients: SugarRoll.be Fact Sheet for Healthcare Providers: https://www.woods-mathews.com/ This test is not yet approved or cleared by the [place] FDA and  has been authorized for detection and/or diagnosis of SARS-CoV-2 by FDA under an Emergency Use Authorization (EUA). This EUA will remain  in effect (meaning this test can be used) for the duration of the COVID-19 declaration under Section 56 4(b)(1) of the Act, 21 U.S.C. section 360bbb-3(b)(1), unless the authorization is terminated or revoked sooner. Performed at [facility], [place] [address] [place], [place] [address]   SARS Coronavirus 2 by RT PCR (hospital order, performed in [facility] hospital lab) Nasopharyngeal Nasopharyngeal Swab     Status: None   Collection Time: 08/07/2019 12:52 PM   Specimen: Nasopharyngeal Swab  Result Value Ref Range Status   SARS Coronavirus 2 NEGATIVE NEGATIVE Final    Comment: (NOTE) SARS-CoV-2 target nucleic acids are NOT DETECTED. The SARS-CoV-2 RNA is generally detectable in upper and lower respiratory specimens during the acute phase of infection. The lowest concentration of SARS-CoV-2 viral copies this assay can detect is 250 copies / mL. A negative result does not preclude SARS-CoV-2 infection and should not be used as the sole basis for treatment or other patient management decisions.  A  negative result may occur with improper specimen collection / handling, submission of specimen other than nasopharyngeal  swab, presence of viral mutation(s) within the areas targeted by this assay, and inadequate number of viral copies (<250 copies / mL). A negative result must be combined with clinical observations, patient history, and epidemiological information. Fact Sheet for Patients:   StrictlyIdeas.no Fact Sheet for Healthcare Providers: BankingDealers.co.za This test is not yet approved or cleared  by the [place] FDA and has been authorized for detection and/or diagnosis of SARS-CoV-2 by FDA under an Emergency Use Authorization (EUA).  This EUA will remain in effect (meaning this test can be used) for the duration of the COVID-19 declaration under Section 564(b)(1) of the Act, 21 U.S.C. section 360bbb-3(b)(1), unless the authorization is terminated or revoked sooner. Performed at [facility], [address], [place], [place] [address]   Urine culture     Status: Abnormal (Preliminary result)   Collection Time: 08/07/2019  1:38 PM   Specimen: Urine, Random  Result Value Ref Range Status   Specimen Description   Final    URINE, RANDOM Performed at [facility], [address], [place], [place] [address]    Special Requests   Final    NONE Performed at [facility], [address] [place], [place] [address]    Culture (A)  Final    20,000 COLONIES/mL STAPHYLOCOCCUS AUREUS SUSCEPTIBILITIES TO FOLLOW Performed at [facility], [place] [address], [place], [place] [address]    Report Status PENDING  Incomplete  MRSA PCR Screening     Status: None   Collection Time: 08/16/2019 11:05 PM   Specimen: Nasal Mucosa; Nasopharyngeal  Result Value Ref Range Status   MRSA by PCR NEGATIVE NEGATIVE Final    Comment:        The GeneXpert MRSA Assay (FDA approved for NASAL specimens only), is one component of a comprehensive MRSA colonization surveillance program. It is not intended to diagnose MRSA infection nor to  guide or monitor treatment for MRSA infections. Performed at [facility], [address] [place], [place] [address]           IMAGING    Dg Chest Port 1 View  Result Date: 08/14/2019 CLINICAL DATA:  Status post intubation EXAM: PORTABLE CHEST 1 VIEW COMPARISON:  Film from the previous day. FINDINGS: Endotracheal tube and gastric catheter are noted in satisfactory position. Cardiac shadow is stable. Aortic calcifications are seen. The previously seen vascular congestion and pulmonary edema has improved slightly in the interval from the prior exam. No bony abnormality is seen. IMPRESSION: Slight improvement in the degree of pulmonary edema when compare with the prior exam. Electronically Signed   By: Inez Catalina M.D.   On: 08/14/2019 03:35       Indwelling Urinary Catheter continued, requirement due to   Reason to continue Indwelling Urinary Catheter strict Intake/Output monitoring for hemodynamic instability         Ventilator continued, requirement due to severe respiratory failure   Ventilator Sedation RASS 0 to -2      ASSESSMENT AND PLAN SYNOPSIS   Severe ACUTE Hypoxic and Hypercapnic Respiratory Failure pulm edema now with acute NSTEMI -continue Full MV support -continue Bronchodilator Therapy -Wean Fio2 and PEEP as tolerated   SUSPECT ACUTE SYSTOLIC CARDIAC FAILURE- ECHO pending ACUTE NSTEMI Case discussed with Dr Clayborn Bigness on call for STEMI EKG shows ST changes I, AVL with recoprocal ST depressions Recommends HEPARIN INFUSION Follow up ECHO and  Troponins  ESRD Renal Failure -follow chem 7 -follow UO -continue Foley Catheter-assess need -Avoid nephrotoxic agents   NEUROLOGY - intubated and sedated Will need to obtain CT head as patient was confused last week Will need to start heparin infusion if CT head NEG   CARDIAC ICU monitoring  ID -continue IV abx as prescibed -follow up cultures  GI GI PROPHYLAXIS as  indicated  NUTRITIONAL STATUS DIET-->TF's as tolerated Constipation protocol as indicated  ENDO - will use ICU hypoglycemic\Hyperglycemia protocol if indicated   ELECTROLYTES -follow labs as needed -replace as needed -pharmacy consultation and following   DVT/GI PRX ordered TRANSFUSIONS AS NEEDED MONITOR FSBS ASSESS the need for LABS as needed   Critical Care Time devoted to patient care services described in this note is 35 minutes.   Overall, patient is critically ill, prognosis is guarded.  Patient with Multiorgan failure and at high risk for cardiac arrest and death.    Corrin Parker, M.D.  [facility]  Medical Director [place] Director [facility] Cardio-Pulmonary Department

## 2019-08-14 NOTE — Progress Notes (Signed)
This note also relates to the following rows which could not be included: Pulse Rate - Cannot attach notes to unvalidated device data Resp - Cannot attach notes to unvalidated device data BP - Cannot attach notes to unvalidated device data  Hd completed  

## 2019-08-14 NOTE — Progress Notes (Signed)
ANTICOAGULATION CONSULT NOTE - Initial Consult  Pharmacy Consult for Heparin Indication: chest pain/ACS  Allergies  Allergen Reactions  . Nystatin Hives and Itching  . Sulfa Antibiotics Swelling, Hives and Rash  . Niaspan [Niacin] Other (See Comments) and Hives   Patient Measurements: Height: 5\' 6"  (167.6 cm) Weight: 220 lb 10.9 oz (100.1 kg) IBW/kg (Calculated) : 59.3 HEPARIN DW (KG): 82.2  Vital Signs: Temp: 98.6 F (37 C) (11/25 2000) Temp Source: Oral (11/25 2000) BP: 107/70 (11/25 2100) Pulse Rate: 107 (11/25 2000)  Labs: Recent Labs    07/21/2019 1043 08/11/2019 1253 08/14/19 0002 08/14/19 1628  HGB 8.5*  --  8.2*  --   HCT 27.1*  --  24.7*  --   PLT 212  --  195  --   CREATININE 9.52*  --  10.24*  --   TROPONINIHS 25* 39*  --  234*    Estimated Creatinine Clearance: 6.3 mL/min (A) (by C-G formula based on SCr of 10.24 mg/dL (H)).  Medical History: Past Medical History:  Diagnosis Date  . Asthma   . CAD (coronary artery disease)   . Chronic lower back pain   . COPD (chronic obstructive pulmonary disease) ([place])   . Depression   . Diabetes mellitus without complication (HCC)    NIDD  . H/O angina pectoris   . H/O blood clots   . Hypercholesteremia   . Hypertension   . Left rotator cuff tear   . Myocardial infarction ([place])   . Obesity   . Renal insufficiency   . Stroke ([place])   . Vertigo, aural    Medications:  Medications Prior to Admission  Medication Sig Dispense Refill Last Dose  . amLODipine (NORVASC) 10 MG tablet Take 10 mg by mouth daily.   08/12/2019 at Unknown time  . aspirin 81 MG chewable tablet Chew 81 mg by mouth daily.   08/12/2019 at Unknown time  . atorvastatin (LIPITOR) 80 MG tablet Take 80 mg by mouth at bedtime.    08/12/2019 at Unknown time  . cetirizine (ZYRTEC) 10 MG tablet Take 10 mg by mouth daily.   08/12/2019 at Unknown time  . cinacalcet (SENSIPAR) 30 MG tablet Take 1 tablet (30 mg total) by mouth daily before breakfast. 60  tablet  08/12/2019 at Unknown time  . clopidogrel (PLAVIX) 75 MG tablet Take 1 tablet (75 mg total) by mouth daily.   08/12/2019 at Unknown time  . fluticasone (FLONASE) 50 MCG/ACT nasal spray Place 2 sprays into both nostrils 2 (two) times daily.   08/12/2019 at Unknown time  . fluticasone furoate-vilanterol (BREO ELLIPTA) 200-25 MCG/INH AEPB Inhale 1 puff into the lungs daily.   08/12/2019 at Unknown time  . folic acid (FOLVITE) 1 MG tablet Take 1 mg by mouth daily.   08/12/2019 at Unknown time  . isosorbide mononitrate (IMDUR) 30 MG 24 hr tablet Take 30 mg by mouth daily.   08/12/2019 at Unknown time  . meclizine (ANTIVERT) 12.5 MG tablet Take 12.5 mg by mouth 3 (three) times daily as needed for dizziness.    08/12/2019 at Unknown time  . metoprolol succinate (TOPROL-XL) 100 MG 24 hr tablet Take 1 tablet (100 mg total) by mouth daily with breakfast. Take with or immediately following a meal.   08/12/2019 at Unknown time  . mirtazapine (REMERON) 7.5 MG tablet Take 7.5 mg by mouth at bedtime.   08/12/2019 at Unknown time  . multivitamin (RENA-VIT) TABS tablet Take 1 tablet by mouth at bedtime.  0  08/12/2019 at Unknown time  . nitroGLYCERIN (NITROLINGUAL) 0.4 MG/SPRAY spray Place 1 spray under the tongue as needed.   prn at prn  . Oxycodone HCl 10 MG TABS Take 10 mg by mouth See admin instructions. Take 1 tablet (10mg ) by mouth 4 to 5 times daily as needed for severe pain   08/12/2019 at Unknown time  . pantoprazole (PROTONIX) 40 MG tablet Take 40 mg by mouth 2 (two) times daily.   08/12/2019 at Unknown time  . ranolazine (RANEXA) 1000 MG SR tablet Take 1 tablet (1,000 mg total) by mouth 2 (two) times daily. 60 tablet 1 08/12/2019 at Unknown time  . sevelamer carbonate (RENVELA) 800 MG tablet Take 2,400 mg by mouth 3 (three) times daily with meals.    08/12/2019 at Unknown time  . torsemide (DEMADEX) 100 MG tablet Take 100 mg by mouth daily.   08/12/2019 at Unknown time  . acetaminophen (TYLENOL) 325  MG tablet Take 2 tablets (650 mg total) by mouth every 6 (six) hours as needed for mild pain (or Fever >/= 101).   prn at prn  . albuterol (PROVENTIL) (2.5 MG/3ML) 0.083% nebulizer solution Take 3 mLs (2.5 mg total) by nebulization every 4 (four) hours as needed for wheezing or shortness of breath. 75 mL 12 prn at prn  . albuterol (VENTOLIN HFA) 108 (90 Base) MCG/ACT inhaler Inhale 1 puff into the lungs every 6 (six) hours as needed for wheezing or shortness of breath.   prn at prn  . calcium carbonate (TUMS - DOSED IN MG ELEMENTAL CALCIUM) 500 MG chewable tablet Chew 2 tablets by mouth 3 (three) times daily.   prn at prn  . lidocaine-prilocaine (EMLA) cream Apply 1 application topically as needed (topical anesthesia for hemodialysis if Gebauers and Lidocaine injection are ineffective.). 30 g 0 prn at prn  . Nutritional Supplements (FEEDING SUPPLEMENT, NEPRO CARB STEADY,) LIQD Take 237 mLs by mouth daily. 237 mL 0    Assessment: Pt received a dose of Heparin 5000 units SQ at 2103.  Baseline labs ordered.  Pharmacy asked to initiate and monitor Heparin for ACS.    Goal of Therapy:  Heparin level 0.3-0.7 units/ml Monitor platelets by anticoagulation protocol: Yes   Plan:  Heparin 2500 units bolus x 1 (reduced due to SQ heparin dose of 5000 units administered) Heparin infusion at 1000 units/hr Check heparin level 8 hours after infusion started  Hart Robinsons A 08/14/2019,9:38 PM

## 2019-08-14 NOTE — Progress Notes (Signed)
This note also relates to the following rows which could not be included: Pulse Rate - Cannot attach notes to unvalidated device data Resp - Cannot attach notes to unvalidated device data  Hd started  

## 2019-08-15 DIAGNOSIS — N186 End stage renal disease: Secondary | ICD-10-CM

## 2019-08-15 LAB — CBC WITH DIFFERENTIAL/PLATELET
Abs Immature Granulocytes: 0.14 10*3/uL — ABNORMAL HIGH (ref 0.00–0.07)
Basophils Absolute: 0 10*3/uL (ref 0.0–0.1)
Basophils Relative: 0 %
Eosinophils Absolute: 0 10*3/uL (ref 0.0–0.5)
Eosinophils Relative: 0 %
HCT: 24.3 % — ABNORMAL LOW (ref 36.0–46.0)
Hemoglobin: 7.6 g/dL — ABNORMAL LOW (ref 12.0–15.0)
Immature Granulocytes: 2 %
Lymphocytes Relative: 9 %
Lymphs Abs: 0.8 10*3/uL (ref 0.7–4.0)
MCH: 31.9 pg (ref 26.0–34.0)
MCHC: 31.3 g/dL (ref 30.0–36.0)
MCV: 102.1 fL — ABNORMAL HIGH (ref 80.0–100.0)
Monocytes Absolute: 1 10*3/uL (ref 0.1–1.0)
Monocytes Relative: 11 %
Neutro Abs: 6.9 10*3/uL (ref 1.7–7.7)
Neutrophils Relative %: 78 %
Platelets: 209 10*3/uL (ref 150–400)
RBC: 2.38 MIL/uL — ABNORMAL LOW (ref 3.87–5.11)
RDW: 14.8 % (ref 11.5–15.5)
WBC: 8.8 10*3/uL (ref 4.0–10.5)
nRBC: 0.2 % (ref 0.0–0.2)

## 2019-08-15 LAB — URINE CULTURE: Culture: 20000 — AB

## 2019-08-15 LAB — BASIC METABOLIC PANEL
Anion gap: 19 — ABNORMAL HIGH (ref 5–15)
BUN: 32 mg/dL — ABNORMAL HIGH (ref 8–23)
CO2: 22 mmol/L (ref 22–32)
Calcium: 7.9 mg/dL — ABNORMAL LOW (ref 8.9–10.3)
Chloride: 96 mmol/L — ABNORMAL LOW (ref 98–111)
Creatinine, Ser: 6.32 mg/dL — ABNORMAL HIGH (ref 0.44–1.00)
GFR calc Af Amer: 7 mL/min — ABNORMAL LOW (ref >60)
GFR calc non Af Amer: 6 mL/min — ABNORMAL LOW (ref >60)
Glucose, Bld: 98 mg/dL (ref 70–99)
Potassium: 3.8 mmol/L (ref 3.5–5.1)
Sodium: 137 mmol/L (ref 135–145)

## 2019-08-15 LAB — GLUCOSE, CAPILLARY
Glucose-Capillary: 101 mg/dL — ABNORMAL HIGH (ref 70–99)
Glucose-Capillary: 87 mg/dL (ref 70–99)
Glucose-Capillary: 109 mg/dL — ABNORMAL HIGH (ref 70–99)
Glucose-Capillary: 78 mg/dL (ref 70–99)
Glucose-Capillary: 79 mg/dL (ref 70–99)

## 2019-08-15 LAB — HEPARIN LEVEL (UNFRACTIONATED)
Heparin Unfractionated: 0.34 IU/mL (ref 0.30–0.70)
Heparin Unfractionated: 0.48 IU/mL (ref 0.30–0.70)

## 2019-08-15 LAB — MAGNESIUM: Magnesium: 1.9 mg/dL (ref 1.7–2.4)

## 2019-08-15 LAB — ECHOCARDIOGRAM COMPLETE
Height: 66 in
Weight: 3530.89 oz

## 2019-08-15 LAB — PHOSPHORUS
Phosphorus: 6.5 mg/dL — ABNORMAL HIGH (ref 2.5–4.6)
Phosphorus: 2.6 mg/dL (ref 2.5–4.6)

## 2019-08-15 MED ORDER — CLOPIDOGREL 5 MG/ML PEDIATRIC ORAL SUSPENSION: 75.0000 mg | Freq: Every day | ORAL | Status: DC

## 2019-08-15 MED ORDER — METOPROLOL TARTRATE 25 MG PO TABS
12.5000 mg | ORAL_TABLET | Freq: Two times a day (BID) | ORAL | Status: DC
  Administered 2019-08-15 – 2019-08-16 (×2): 12.5 mg
  Filled 2019-08-15 (×2): qty 1

## 2019-08-15 MED ORDER — CLOPIDOGREL BISULFATE 75 MG PO TABS: 75.0000 mg | ORAL_TABLET | Freq: Every day | ORAL | Status: DC

## 2019-08-15 MED ORDER — EPOETIN ALFA 10000 UNIT/ML IJ SOLN
10000.0000 [IU] | INTRAMUSCULAR | Status: DC
  Administered 2019-08-15 – 2019-08-17 (×2): 10000 [IU] via INTRAVENOUS
  Filled 2019-08-15: qty 1

## 2019-08-15 MED ORDER — METOPROLOL TARTRATE 25 MG/10 ML ORAL SUSPENSION
12.5000 mg | Freq: Two times a day (BID) | ORAL | Status: DC
  Filled 2019-08-15: qty 5

## 2019-08-15 NOTE — Progress Notes (Signed)
Hd Tx Start BP noticed to slightly drop, RN made aware and reduced Fentanyl to 1032mcghr HD tx start    08/15/19 1200  Vital Signs  Pulse Rate (!) 102  Pulse Rate Source Monitor  Resp (!) 23  BP 109/66  BP Location Right Arm  BP Method Automatic  Patient Position (if appropriate) Lying  Oxygen Therapy  SpO2 100 %  O2 Device Ventilator  FiO2 (%) 35 %  SpO2 Alarm Limit Low 98  End [place] CO2 (EtCO2) 36  Pain Assessment  Pain Scale 0-10  Pain Score Asleep  During Hemodialysis Assessment  Blood Flow Rate (mL/min) 400 mL/min  Arterial Pressure (mmHg) -130 mmHg  Venous Pressure (mmHg) 180 mmHg  Transmembrane Pressure (mmHg) 80 mmHg  Ultrafiltration Rate (mL/min) 710 mL/min  Dialysate Flow Rate (mL/min) 800 ml/min  Conductivity: Machine  13.9  HD Safety Checks Performed Yes  Dialysis Fluid Bolus Normal Saline  Bolus Amount (mL) 250 mL  Intra-Hemodialysis Comments Tx initiated

## 2019-08-15 NOTE — Progress Notes (Signed)
ANTICOAGULATION CONSULT NOTE - Initial Consult  Pharmacy Consult for Heparin Indication: chest pain/ACS  Patient Measurements: Height: 5\' 6"  (167.6 cm) Weight: 217 lb 9.5 oz (98.7 kg) IBW/kg (Calculated) : 59.3 HEPARIN DW (KG): 82.2  Vital Signs: Temp: 99.2 F (37.3 C) (11/26 1545) Temp Source: Axillary (11/26 1545) BP: 123/82 (11/26 1545) Pulse Rate: 108 (11/26 1545)  Labs: Recent Labs    07/28/2019 1043 08/06/2019 1253 08/14/19 0002 08/14/19 1628 08/14/19 2202 08/15/19 0620 08/15/19 1445  HGB 8.5*  --  8.2*  --   --  7.6*  --   HCT 27.1*  --  24.7*  --   --  24.3*  --   PLT 212  --  195  --   --  209  --   APTT  --   --   --   --  37*  --   --   LABPROT  --   --   --   --  13.9  --   --   INR  --   --   --   --  1.1  --   --   HEPARINUNFRC  --   --   --   --   --  0.34 0.48  CREATININE 9.52*  --  10.24*  --   --  6.32*  --   TROPONINIHS 25* 39*  --  234*  --   --   --     Estimated Creatinine Clearance: 10.1 mL/min (A) (by C-G formula based on SCr of 6.32 mg/dL (H)).  Medical History: Past Medical History:  Diagnosis Date  . Asthma   . CAD (coronary artery disease)   . Chronic lower back pain   . COPD (chronic obstructive pulmonary disease) ([place])   . Depression   . Diabetes mellitus without complication (HCC)    NIDD  . H/O angina pectoris   . H/O blood clots   . Hypercholesteremia   . Hypertension   . Left rotator cuff tear   . Myocardial infarction ([place])   . Obesity   . Renal insufficiency   . Stroke ([place])   . Vertigo, aural    Medications:  Medications Prior to Admission  Medication Sig Dispense Refill Last Dose  . amLODipine (NORVASC) 10 MG tablet Take 10 mg by mouth daily.   08/12/2019 at Unknown time  . aspirin 81 MG chewable tablet Chew 81 mg by mouth daily.   08/12/2019 at Unknown time  . atorvastatin (LIPITOR) 80 MG tablet Take 80 mg by mouth at bedtime.    08/12/2019 at Unknown time  . cetirizine (ZYRTEC) 10 MG tablet Take 10 mg by mouth  daily.   08/12/2019 at Unknown time  . cinacalcet (SENSIPAR) 30 MG tablet Take 1 tablet (30 mg total) by mouth daily before breakfast. 60 tablet  08/12/2019 at Unknown time  . clopidogrel (PLAVIX) 75 MG tablet Take 1 tablet (75 mg total) by mouth daily.   08/12/2019 at Unknown time  . fluticasone (FLONASE) 50 MCG/ACT nasal spray Place 2 sprays into both nostrils 2 (two) times daily.   08/12/2019 at Unknown time  . fluticasone furoate-vilanterol (BREO ELLIPTA) 200-25 MCG/INH AEPB Inhale 1 puff into the lungs daily.   08/12/2019 at Unknown time  . folic acid (FOLVITE) 1 MG tablet Take 1 mg by mouth daily.   08/12/2019 at Unknown time  . isosorbide mononitrate (IMDUR) 30 MG 24 hr tablet Take 30 mg by mouth daily.   08/12/2019 at Unknown time  .  meclizine (ANTIVERT) 12.5 MG tablet Take 12.5 mg by mouth 3 (three) times daily as needed for dizziness.    08/12/2019 at Unknown time  . metoprolol succinate (TOPROL-XL) 100 MG 24 hr tablet Take 1 tablet (100 mg total) by mouth daily with breakfast. Take with or immediately following a meal.   08/12/2019 at Unknown time  . mirtazapine (REMERON) 7.5 MG tablet Take 7.5 mg by mouth at bedtime.   08/12/2019 at Unknown time  . multivitamin (RENA-VIT) TABS tablet Take 1 tablet by mouth at bedtime.  0 08/12/2019 at Unknown time  . nitroGLYCERIN (NITROLINGUAL) 0.4 MG/SPRAY spray Place 1 spray under the tongue as needed.   prn at prn  . Oxycodone HCl 10 MG TABS Take 10 mg by mouth See admin instructions. Take 1 tablet (10mg ) by mouth 4 to 5 times daily as needed for severe pain   08/12/2019 at Unknown time  . pantoprazole (PROTONIX) 40 MG tablet Take 40 mg by mouth 2 (two) times daily.   08/12/2019 at Unknown time  . ranolazine (RANEXA) 1000 MG SR tablet Take 1 tablet (1,000 mg total) by mouth 2 (two) times daily. 60 tablet 1 08/12/2019 at Unknown time  . sevelamer carbonate (RENVELA) 800 MG tablet Take 2,400 mg by mouth 3 (three) times daily with meals.    08/12/2019 at  Unknown time  . torsemide (DEMADEX) 100 MG tablet Take 100 mg by mouth daily.   08/12/2019 at Unknown time  . acetaminophen (TYLENOL) 325 MG tablet Take 2 tablets (650 mg total) by mouth every 6 (six) hours as needed for mild pain (or Fever >/= 101).   prn at prn  . albuterol (PROVENTIL) (2.5 MG/3ML) 0.083% nebulizer solution Take 3 mLs (2.5 mg total) by nebulization every 4 (four) hours as needed for wheezing or shortness of breath. 75 mL 12 prn at prn  . albuterol (VENTOLIN HFA) 108 (90 Base) MCG/ACT inhaler Inhale 1 puff into the lungs every 6 (six) hours as needed for wheezing or shortness of breath.   prn at prn  . calcium carbonate (TUMS - DOSED IN MG ELEMENTAL CALCIUM) 500 MG chewable tablet Chew 2 tablets by mouth 3 (three) times daily.   prn at prn  . lidocaine-prilocaine (EMLA) cream Apply 1 application topically as needed (topical anesthesia for hemodialysis if Gebauers and Lidocaine injection are ineffective.). 30 g 0 prn at prn  . Nutritional Supplements (FEEDING SUPPLEMENT, NEPRO CARB STEADY,) LIQD Take 237 mLs by mouth daily. 237 mL 0    Assessment: 68 y.o.femalewith past medical history of CAD status post CABG, hypertension, hyperlipidemia, diabetes, COPDon 2L [place], and ESRD on HD who presents to the ED complaining of shortness of breath and generalized weakness.  Pharmacy asked to initiate and monitor Heparin for ACS c/w NSTEMI, 1st HS troponin 234, Hgb low at baseline, PLT wnl, baseline INR 1.1, aPTT 37s  Heparin Course: 11/25 pm initiation: 2500 unit bolus, then 1000 units/hr 11/26 0620 HL 0.34 therapeutic x 1 11/26 1445 HL 0.48 therapeutic x 2  Goal of Therapy:  Heparin level 0.3-0.7 units/ml Monitor platelets by anticoagulation protocol: Yes   Plan:   Continue heparin infusion at 1000 units/hr  Check heparin level/CBC in am  Lu Duffel, PharmD, BCPS Clinical Pharmacist 08/15/2019 3:54 PM

## 2019-08-15 NOTE — Progress Notes (Signed)
Follow up - Critical Care Medicine Note  Patient Details:    Kimberly Shaffer is an 68 y.o. femalewith past medical history of CAD status post CABG, hypertension, hyperlipidemia, diabetes, COPDon 2L [place], and ESRD on HD who presents to the ED complaining of shortness of breath and generalized weakness -patient with severe hypoxia progressive resp failure placed on BiPAP She has ESRD on HD last HD was Saturday Failed BiPAP and now intubated and placed on MV support  Lines, Airways, Drains: Airway 7.5 mm (Active)  Secured at (cm) 23 cm 08/15/19 0824  Measured From Lips 08/15/19 0824  Secured Location Right 08/15/19 0824  Secured By [employer] 08/15/19 0824  Tube Holder Repositioned Yes 08/15/19 0824  Cuff Pressure (cm H2O) 26 cm H2O 08/15/19 0824  Site Condition Cool;Dry 08/15/19 0824     NG/OG Tube Orogastric 18 Fr. Center mouth Xray (Active)  Cm Marking at Nare/Corner of Mouth (if applicable) 56 cm [id number]  Site Assessment Clean;Dry;Intact 08/15/19 0800  Ongoing Placement Verification No change in cm markings or external length of tube from initial placement;No change in respiratory status;No acute changes, not attributed to clinical condition 08/15/19 0800  Status Suction-low intermittent 08/15/19 0800  Drainage Appearance Bile 08/15/19 0400  Output (mL) 150 mL 08/14/19 2000     Urethral Catheter Sherie RN Latex 16 Fr. (Active)  Indication for Insertion or Continuance of Catheter Therapy based on hourly urine output monitoring and documentation for critical condition (NOT STRICT I&O) 08/15/19 0800  Site Assessment Clean;Intact;Dry 08/15/19 0800  Catheter Maintenance Bag below level of bladder;Catheter secured;Drainage bag/tubing not touching floor;Insertion date on drainage bag;No dependent loops;Seal intact 08/15/19 0800  Collection Container Standard drainage bag 08/15/19 0800  Securement Method Securing device (Describe) 08/15/19 0800  Urinary Catheter  Interventions (if applicable) Unclamped [id number]  Output (mL) 15 mL 08/15/19 0400    Anti-infectives:  Anti-infectives (From admission, onward)   Start     Dose/Rate Route Frequency Ordered Stop   08/15/2019 1345  cefTRIAXone (ROCEPHIN) 1 g in sodium chloride 0.9 % 100 mL IVPB     1 g 200 mL/hr over 30 Minutes Intravenous  Once 08/11/2019 1337 07/27/2019 1520      Microbiology: Results for orders placed or performed during the hospital encounter of 08/14/2019  SARS Coronavirus 2 by RT PCR (hospital order, performed in [facility] hospital lab) Nasopharyngeal Nasopharyngeal Swab     Status: None   Collection Time: 08/02/2019 12:52 PM   Specimen: Nasopharyngeal Swab  Result Value Ref Range Status   SARS Coronavirus 2 NEGATIVE NEGATIVE Final    Comment: (NOTE) SARS-CoV-2 target nucleic acids are NOT DETECTED. The SARS-CoV-2 RNA is generally detectable in upper and lower respiratory specimens during the acute phase of infection. The lowest concentration of SARS-CoV-2 viral copies this assay can detect is 250 copies / mL. A negative result does not preclude SARS-CoV-2 infection and should not be used as the sole basis for treatment or other patient management decisions.  A negative result may occur with improper specimen collection / handling, submission of specimen other than nasopharyngeal swab, presence of viral mutation(s) within the areas targeted by this assay, and inadequate number of viral copies (<250 copies / mL). A negative result must be combined with clinical observations, patient history, and epidemiological information. Fact Sheet for Patients:   StrictlyIdeas.no Fact Sheet for Healthcare Providers: BankingDealers.co.za This test is not yet approved or cleared  by the [place] FDA and has been authorized for detection  and/or diagnosis of SARS-CoV-2 by FDA under an Emergency Use Authorization (EUA).  This EUA will  remain in effect (meaning this test can be used) for the duration of the COVID-19 declaration under Section 564(b)(1) of the Act, 21 U.S.C. section 360bbb-3(b)(1), unless the authorization is terminated or revoked sooner. Performed at [facility], [address] [place], [place] [address]   Urine culture     Status: Abnormal   Collection Time: 08/15/2019  1:38 PM   Specimen: Urine, Random  Result Value Ref Range Status   Specimen Description   Final    URINE, RANDOM Performed at [facility], [address] [place], [place] [address]    Special Requests   Final    NONE Performed at [facility], [place], [place] [address]    Culture 20,000 COLONIES/mL STAPHYLOCOCCUS AUREUS (A)  Final   Report Status 08/15/2019 FINAL  Final   Organism ID, Bacteria STAPHYLOCOCCUS AUREUS (A)  Final      Susceptibility   Staphylococcus aureus - MIC*    CIPROFLOXACIN <=0.5 SENSITIVE Sensitive     GENTAMICIN <=0.5 SENSITIVE Sensitive     NITROFURANTOIN <=16 SENSITIVE Sensitive     OXACILLIN 1 SENSITIVE Sensitive     TETRACYCLINE <=1 SENSITIVE Sensitive     VANCOMYCIN <=0.5 SENSITIVE Sensitive     TRIMETH/SULFA <=10 SENSITIVE Sensitive     CLINDAMYCIN <=0.25 SENSITIVE Sensitive     RIFAMPIN <=0.5 SENSITIVE Sensitive     Inducible Clindamycin NEGATIVE Sensitive     * 20,000 COLONIES/mL STAPHYLOCOCCUS AUREUS  MRSA PCR Screening     Status: None   Collection Time: 08/08/2019 11:05 PM   Specimen: Nasal Mucosa; Nasopharyngeal  Result Value Ref Range Status   MRSA by PCR NEGATIVE NEGATIVE Final    Comment:        The GeneXpert MRSA Assay (FDA approved for NASAL specimens only), is one component of a comprehensive MRSA colonization surveillance program. It is not intended to diagnose MRSA infection nor to guide or monitor treatment for MRSA infections. Performed at [facility], [address] [place], [place] [address]     Best  Practice/Protocols:  VTE Prophylaxis: Heparin (drip) GI Prophylaxis: Antihistamine Continous Sedation Hyperglycemia (ICU)  Events: 11/24 acute pulm edema and ETT intubation and MV support 11/25 failed SAT/SBT  Now with acute coronary syndrome c/w NSTEMI Findings reviewed with STEMI cardiologist on call-He reviewed EKG and  is c/w NSTEMI and cardiac ischemia needs anticoagulation 11/26 still requiring ventilator however able to tolerate SBT  Studies: Ct Head Wo Contrast  Result Date: 08/14/2019 CLINICAL DATA:  Ataxia, stroke suspected. Additional history provided: Patient admitted yesterday for acute respiratory failure. EXAM: CT HEAD WITHOUT CONTRAST TECHNIQUE: Contiguous axial images were obtained from the base of the skull through the vertex without intravenous contrast. COMPARISON:  CTA head 09/25/2018 FINDINGS: Brain: No evidence of acute intracranial hemorrhage. No demarcated cortical infarction. No evidence of intracranial mass. No midline shift or extra-axial fluid collection. Redemonstrated advanced chronic small vessel ischemic disease. This includes prominent chronic small vessel ischemic changes within the bilateral basal ganglia, thalami and within the pons. Mild generalized parenchymal atrophy. Vascular: No hyperdense vessel.  Atherosclerotic calcifications. Skull: Normal. Negative for fracture or focal lesion. Sinuses/Orbits: Visualized orbits demonstrate no acute abnormality. Extensive partial opacification of the visualized right maxillary sinus. Secretions are also present within the pharynx. No significant mastoid effusion. IMPRESSION: 1. No CT evidence of acute intracranial abnormality. 2. Stable generalized parenchymal atrophy and  advanced chronic small vessel ischemic disease. 3. Extensive partial opacification of the partially imaged right maxillary sinus. Correlate for acute sinusitis. Electronically Signed   By: Kellie Simmering DO   On: 08/14/2019 21:00   US Venous Img Lower  Unilateral Right (dvt)  Result Date: 08/06/2019 CLINICAL DATA:  Right leg swelling for 1 month. EXAM: RIGHT LOWER EXTREMITY VENOUS DOPPLER ULTRASOUND TECHNIQUE: Gray-scale sonography with graded compression, as well as color Doppler and duplex ultrasound were performed to evaluate the lower extremity deep venous systems from the level of the common femoral vein and including the common femoral, femoral, profunda femoral, popliteal and calf veins including the posterior tibial, peroneal and gastrocnemius veins when visible. The superficial great saphenous vein was also interrogated. Spectral Doppler was utilized to evaluate flow at rest and with distal augmentation maneuvers in the common femoral, femoral and popliteal veins. COMPARISON:  None. FINDINGS: Contralateral Common Femoral Vein: Respiratory phasicity is normal and symmetric with the symptomatic side. No evidence of thrombus. Normal compressibility. Common Femoral Vein: No evidence of thrombus. Normal compressibility, respiratory phasicity and response to augmentation. Saphenofemoral Junction: No evidence of thrombus. Normal compressibility and flow on color Doppler imaging. Profunda Femoral Vein: No evidence of thrombus. Normal compressibility and flow on color Doppler imaging. Femoral Vein: No evidence of thrombus. Normal compressibility, respiratory phasicity and response to augmentation. Popliteal Vein: No evidence of thrombus. Normal compressibility, respiratory phasicity and response to augmentation. Calf Veins: No evidence of thrombus. Normal compressibility and flow on color Doppler imaging. Superficial Great Saphenous Vein: No evidence of thrombus. Normal compressibility. Venous Reflux:  None. Other Findings: Complex fluid collection in the right popliteal fossa measuring 8.1 2.9 x 2.5 cm, likely ossified intra-articular bodies. IMPRESSION: 1. No evidence of right lower extremity DVT. 2. Complex fluid collection in the right popliteal fossa  likely Baker cyst containing probable intra-articular bodies. Electronically Signed   By: Keith Rake M.D.   On: 08/06/2019 04:34   Dg Chest Port 1 View  Result Date: 08/14/2019 CLINICAL DATA:  Status post intubation EXAM: PORTABLE CHEST 1 VIEW COMPARISON:  Film from the previous day. FINDINGS: Endotracheal tube and gastric catheter are noted in satisfactory position. Cardiac shadow is stable. Aortic calcifications are seen. The previously seen vascular congestion and pulmonary edema has improved slightly in the interval from the prior exam. No bony abnormality is seen. IMPRESSION: Slight improvement in the degree of pulmonary edema when compare with the prior exam. Electronically Signed   By: Inez Catalina M.D.   On: 08/14/2019 03:35   Dg Chest Portable 1 View  Result Date: 08/01/2019 CLINICAL DATA:  Post intubation. EXAM: PORTABLE CHEST 1 VIEW COMPARISON:  Chest x-ray from same day. FINDINGS: Interval intubation with endotracheal tube in good position 2.5 cm above the carina. Unchanged cardiomegaly with diffuse interstitial opacities. New mid to lower lung patchy airspace disease, worse on the right. No pneumothorax or large pleural effusion. No acute osseous abnormality. IMPRESSION: 1. Appropriately positioned endotracheal tube. 2. Worsening pulmonary edema. Electronically Signed   By: Titus Dubin M.D.   On: 08/19/2019 16:42   Dg Chest Port 1 View  Result Date: 08/15/2019 CLINICAL DATA:  Shortness of breath. Additional history provided: Generalized weakness and stomach upset. Dialysis patient with last treatment Saturday. EXAM: PORTABLE CHEST 1 VIEW COMPARISON:  Chest radiograph 08/05/2019 FINDINGS: Stable cardiomegaly.  Aortic atherosclerosis. As before, there are bilateral interstitial opacities with a mid to lower lung predominance. No evidence of pneumothorax or sizable pleural effusion. No acute bony abnormality.  IMPRESSION: Stable cardiomegaly. Bilateral interstitial pulmonary  opacities are similar to exam 08/05/2019. This may reflect interstitial edema or infection. Aortic atherosclerosis. Electronically Signed   By: Kellie Simmering DO   On: 08/07/2019 12:11   Dg Chest Portable 1 View  Result Date: 08/05/2019 CLINICAL DATA:  Left side chest pain EXAM: PORTABLE CHEST 1 VIEW COMPARISON:  07/06/2019 FINDINGS: Cardiomegaly with vascular congestion. Diffuse interstitial prominence likely reflects interstitial edema. No confluent opacities or effusions. Aortic atherosclerosis. No acute bony abnormality. IMPRESSION: Cardiomegaly, suspect mild interstitial edema. Electronically Signed   By: Rolm Baptise M.D.   On: 08/05/2019 21:56   Dg Abd Portable 1v  Result Date: 07/26/2019 CLINICAL DATA:  Orogastric tube placement EXAM: PORTABLE ABDOMEN - 1 VIEW COMPARISON:  06/26/2018 FINDINGS: NG tube in the body the stomach.  Normal bowel gas pattern. Bibasilar airspace disease. IMPRESSION: NG tube in the body the stomach.  Normal bowel gas pattern. Electronically Signed   By: Franchot Gallo M.D.   On: 07/31/2019 17:39    Consults: [facility] cardiology  Subjective:    Overnight Issues: Started on heparin drip due to NSTEMI.  No further evidence of ischemia.  Mains on the ventilator.  More responsive today following commands.  Objective:  Vital signs for last 24 hours: Temp:  [97.9 F (36.6 C)-99.4 F (37.4 C)] 99.4 F (37.4 C) (11/26 0800) Pulse Rate:  [62-107] 107 (11/26 0900) Resp:  [21-30] 23 (11/26 1000) BP: (102-132)/(50-81) 111/66 (11/26 1000) SpO2:  [95 %-100 %] 95 % (11/26 1101) FiO2 (%):  [25 %-35 %] 35 % (11/26 1101) Weight:  [98.7 kg] 98.7 kg (11/26 0322)  Hemodynamic parameters for last 24 hours:    Intake/Output from previous day: 11/25 0701 - 11/26 0700 In: 1154 [I.V.:1104; IV Piggyback:50] Out: 2680 [Urine:30; Emesis/NG output:150]  Intake/Output this shift: Total I/O In: 387.1 [I.V.:387.1] Out: -   Vent settings for last 24 hours: Vent Mode: PRVC FiO2  (%):  [25 %-35 %] 35 % Set Rate:  [23 bmp] 23 bmp Vt Set:  [480 mL] 480 mL PEEP:  [5 cmH20] 5 cmH20 Plateau Pressure:  [18 [id number] cmH20] 18 cmH20  Physical Exam:  GENERAL: Sedated, mechanically ventilated, will open eyes, tracks and follows commands. HEAD: Normocephalic, atraumatic.  EYES: Pupils equal, round, reactive to light.  No scleral icterus.  Query mild conjunctivitis MOUTH: Orotracheally intubated, OG in place. NECK: Supple.  Trachea midline, no JVD PULMONARY: Coarse breath sounds, no wheezes or rhonchi noted.  Good air entry bilaterally on vent.  Synchronous with the vent. CARDIOVASCULAR: S1 and S2. Regular rate and rhythm. No murmurs, rubs, or gallops.  GASTROINTESTINAL: Soft, nondistended. No masses. Positive bowel sounds.   MUSCULOSKELETAL: No swelling, clubbing, or edema.  NEUROLOGIC: Sedated, does open eyes to name and minimal stimulus.  Tracks, follows commands. SKIN:intact,warm,dry  Assessment/Plan:   ACUTE Hypoxic Respiratory Failure due to acute pulmonary edema in the setting of acute NSTEMI  continue Full MV support  Management of NSTEMI, volume overload management with HD  Wean Fio2 and PEEP as tolerated  Initiated SBT hopefully extubate later today   SUSPECT ACUTE SYSTOLIC CARDIAC FAILURE- ECHO pending ACUTE NSTEMI  Case discussed with Dr Clayborn Bigness on call for STEMI (per Dr. Mortimer Fries 11/25)  EKG shows ST changes I, AVL with recoprocal ST depressions  On HEPARIN INFUSION  Follow up ECHO and Troponins  Previously evaluated by [facility] 11/17 see consult note  History of severe three-vessel CAD status post CABG and PCI, very difficult due to tortuous  vessels  Echo at [facility] 06/21/2019 showed EF >55%.  Myocardial stress test at [facility] 07/22/2019 showed moderate intensity and large in size inferolateral defect that was partially reversible.  ESRD  on dialysis TTS  Neurology following, directing dialysis, appreciate input  Monitor electrolytes,  management per renal and pharmacy  continue Foley Catheter-assess need  Avoid nephrotoxic agents  Dialysis for volume management as needed   NEUROLOGY  intubated and sedated  CT head negative  Daily wake up assessment  CARDIAC  ICU monitoring  NSTEMI on heparin infusion will need for 72 hours  Will corroborate cardiology still following  2D echo reading pending  ID  No evidence of acute infection  Antibiotics currently not indicated  GI  GI PROPHYLAXIS: Famotidine   NUTRITIONAL STATUS  DIET-->TF's as tolerated  Constipation protocol as indicated  ENDO  will use ICU hypoglycemic\Hyperglycemia protocol if indicated   ELECTROLYTES  follow labs as needed  replace as needed, management per renal due to HD requirement  pharmacy consulted and following  HISTORY of OSA  This issue adds complexity to her management vis--vis extubation.  Will likely require nocturnal CPAP/BiPAP after extubation.    LOS: 2 days   Additional comments:None  Critical Care Total Time*: 40 Minutes   C. Derrill Kay, MD [facility] 08/15/2019  *Care during the described time interval was provided by me and/or other providers on the critical care team.  I have reviewed this patient's available data, including medical history, events of note, physical examination and test results as part of my evaluation.  **This note was dictated using voice recognition software/Dragon.  Despite best efforts to proofread, errors can occur which can change the meaning.  Any change was purely unintentional.

## 2019-08-15 NOTE — Progress Notes (Signed)
[place], [place] 08/15/19  Subjective:  Patient seen at bedside. Remains critically ill at the moment. Underwent hemodialysis yesterday. We are planning for an additional dialysis treatment today.   Objective:  Vital signs in last 24 hours:  Temp:  [97.9 F (36.6 C)-99.4 F (37.4 C)] 99.4 F (37.4 C) (11/26 0800) Pulse Rate:  [59-107] 90 (11/26 0500) Resp:  [23-30] 23 (11/26 0757) BP: (102-132)/(50-81) 113/65 (11/26 0500) SpO2:  [95 %-100 %] 99 % (11/26 0824) FiO2 (%):  [25 %-35 %] 35 % (11/26 0824) Weight:  [98.7 kg] 98.7 kg (11/26 0322)  Weight change: -0.9 kg Filed Weights   08/14/19 0500 08/14/19 0940 08/15/19 0322  Weight: 100.1 kg 100.1 kg 98.7 kg    Intake/Output:    Intake/Output Summary (Last 24 hours) at 08/15/2019 0943 Last data filed at 08/15/2019 0400 Gross per 24 hour  Intake 1020.69 ml  Output 2680 ml  Net -1659.31 ml     Physical Exam: General:  Critically ill appearing  HEENT  anicteric, moist oral mucous membranes, ETT in place  Pulm/lungs  bilateral rales and rhonchi, vent assisted  CVS/Heart  S1S2 no rubs  Abdomen:   Soft, nontender, BS present  Extremities:  Trace pitting edema  Neurologic:  Arousable, not following commands however  Skin:  No acute rashes  Access:  Left arm AV fistula       Basic Metabolic Panel:  Recent Labs  Lab 07/27/2019 1043 08/14/19 0002 08/14/19 0944 08/15/19 0620  NA 135 138  --  137  K 4.4 5.0  --  3.8  CL 98 102  --  96*  CO2 21* 18*  --  22  GLUCOSE 119* 115*  --  98  BUN 49* 57*  --  32*  CREATININE 9.52* 10.24*  --  6.32*  CALCIUM 7.9* 7.8*  --  7.9*  MG  --  1.6*  --  1.9  PHOS  --  7.4* 6.7* 6.5*     CBC: Recent Labs  Lab 08/05/2019 1043 08/14/19 0002 08/15/19 0620  WBC 9.1 8.8 8.8  NEUTROABS  --   --  6.9  HGB 8.5* 8.2* 7.6*  HCT 27.1* 24.7* 24.3*  MCV 102.7* 96.5 102.1*  PLT 212 195 209      Lab Results  Component Value Date   HEPBSAG NON  REACTIVE 08/14/2019   HEPBSAB Reactive 01/11/2018      Microbiology:  Recent Results (from the past 240 hour(s))  SARS CORONAVIRUS 2 (TAT 6-24 HRS) Nasopharyngeal Nasopharyngeal Swab     Status: None   Collection Time: 08/06/19 12:26 AM   Specimen: Nasopharyngeal Swab  Result Value Ref Range Status   SARS Coronavirus 2 NEGATIVE NEGATIVE Final    Comment: (NOTE) SARS-CoV-2 target nucleic acids are NOT DETECTED. The SARS-CoV-2 RNA is generally detectable in upper and lower respiratory specimens during the acute phase of infection. Negative results do not preclude SARS-CoV-2 infection, do not rule out co-infections with other pathogens, and should not be used as the sole basis for treatment or other patient management decisions. Negative results must be combined with clinical observations, patient history, and epidemiological information. The expected result is Negative. Fact Sheet for Patients: SugarRoll.be Fact Sheet for Healthcare Providers: https://www.woods-mathews.com/ This test is not yet approved or cleared by the [place] FDA and  has been authorized for detection and/or diagnosis of SARS-CoV-2 by FDA under an Emergency Use Authorization (EUA). This EUA will remain  in effect (meaning this test can  be used) for the duration of the COVID-19 declaration under Section 56 4(b)(1) of the Act, 21 U.S.C. section 360bbb-3(b)(1), unless the authorization is terminated or revoked sooner. Performed at [facility], [place] [address], [place], [place] [address]   SARS Coronavirus 2 by RT PCR (hospital order, performed in [facility] hospital lab) Nasopharyngeal Nasopharyngeal Swab     Status: None   Collection Time: 08/16/2019 12:52 PM   Specimen: Nasopharyngeal Swab  Result Value Ref Range Status   SARS Coronavirus 2 NEGATIVE NEGATIVE Final    Comment: (NOTE) SARS-CoV-2 target nucleic acids are NOT DETECTED. The SARS-CoV-2 RNA is  generally detectable in upper and lower respiratory specimens during the acute phase of infection. The lowest concentration of SARS-CoV-2 viral copies this assay can detect is 250 copies / mL. A negative result does not preclude SARS-CoV-2 infection and should not be used as the sole basis for treatment or other patient management decisions.  A negative result may occur with improper specimen collection / handling, submission of specimen other than nasopharyngeal swab, presence of viral mutation(s) within the areas targeted by this assay, and inadequate number of viral copies (<250 copies / mL). A negative result must be combined with clinical observations, patient history, and epidemiological information. Fact Sheet for Patients:   StrictlyIdeas.no Fact Sheet for Healthcare Providers: BankingDealers.co.za This test is not yet approved or cleared  by the [place] FDA and has been authorized for detection and/or diagnosis of SARS-CoV-2 by FDA under an Emergency Use Authorization (EUA).  This EUA will remain in effect (meaning this test can be used) for the duration of the COVID-19 declaration under Section 564(b)(1) of the Act, 21 U.S.C. section 360bbb-3(b)(1), unless the authorization is terminated or revoked sooner. Performed at [facility], [address] [place], [place] [address]   Urine culture     Status: Abnormal   Collection Time: 07/27/2019  1:38 PM   Specimen: Urine, Random  Result Value Ref Range Status   Specimen Description   Final    URINE, RANDOM Performed at [facility], [address] [place], [place] [address]    Special Requests   Final    NONE Performed at [facility], [place], [place] [address]    Culture 20,000 COLONIES/mL STAPHYLOCOCCUS AUREUS (A)  Final   Report Status 08/15/2019 FINAL  Final   Organism ID, Bacteria STAPHYLOCOCCUS AUREUS (A)  Final       Susceptibility   Staphylococcus aureus - MIC*    CIPROFLOXACIN <=0.5 SENSITIVE Sensitive     GENTAMICIN <=0.5 SENSITIVE Sensitive     NITROFURANTOIN <=16 SENSITIVE Sensitive     OXACILLIN 1 SENSITIVE Sensitive     TETRACYCLINE <=1 SENSITIVE Sensitive     VANCOMYCIN <=0.5 SENSITIVE Sensitive     TRIMETH/SULFA <=10 SENSITIVE Sensitive     CLINDAMYCIN <=0.25 SENSITIVE Sensitive     RIFAMPIN <=0.5 SENSITIVE Sensitive     Inducible Clindamycin NEGATIVE Sensitive     * 20,000 COLONIES/mL STAPHYLOCOCCUS AUREUS  MRSA PCR Screening     Status: None   Collection Time: 08/15/2019 11:05 PM   Specimen: Nasal Mucosa; Nasopharyngeal  Result Value Ref Range Status   MRSA by PCR NEGATIVE NEGATIVE Final    Comment:        The GeneXpert MRSA Assay (FDA approved for NASAL specimens only), is one component of a comprehensive MRSA colonization surveillance program. It is not intended to diagnose MRSA infection nor to guide or monitor  treatment for MRSA infections. Performed at [facility], [address] [place], [place] [address]     Coagulation Studies: Recent Labs    08/14/19 12/23/00  LABPROT 13.9  INR 1.1    Urinalysis: Recent Labs    07/26/2019 1236  COLORURINE AMBER*  LABSPEC 1.010  PHURINE 6.0  GLUCOSEU NEGATIVE  HGBUR MODERATE*  BILIRUBINUR NEGATIVE  KETONESUR NEGATIVE  PROTEINUR 100*  NITRITE POSITIVE*  LEUKOCYTESUR LARGE*      Imaging: Ct Head Wo Contrast  Result Date: 08/14/2019 CLINICAL DATA:  Ataxia, stroke suspected. Additional history provided: Patient admitted yesterday for acute respiratory failure. EXAM: CT HEAD WITHOUT CONTRAST TECHNIQUE: Contiguous axial images were obtained from the base of the skull through the vertex without intravenous contrast. COMPARISON:  CTA head 09/25/2018 FINDINGS: Brain: No evidence of acute intracranial hemorrhage. No demarcated cortical infarction. No evidence of intracranial mass. No midline shift or extra-axial fluid  collection. Redemonstrated advanced chronic small vessel ischemic disease. This includes prominent chronic small vessel ischemic changes within the bilateral basal ganglia, thalami and within the pons. Mild generalized parenchymal atrophy. Vascular: No hyperdense vessel.  Atherosclerotic calcifications. Skull: Normal. Negative for fracture or focal lesion. Sinuses/Orbits: Visualized orbits demonstrate no acute abnormality. Extensive partial opacification of the visualized right maxillary sinus. Secretions are also present within the pharynx. No significant mastoid effusion. IMPRESSION: 1. No CT evidence of acute intracranial abnormality. 2. Stable generalized parenchymal atrophy and advanced chronic small vessel ischemic disease. 3. Extensive partial opacification of the partially imaged right maxillary sinus. Correlate for acute sinusitis. Electronically Signed   By: Kellie Simmering DO   On: 08/14/2019 21:00   Dg Chest Port 1 View  Result Date: 08/14/2019 CLINICAL DATA:  Status post intubation EXAM: PORTABLE CHEST 1 VIEW COMPARISON:  Film from the previous day. FINDINGS: Endotracheal tube and gastric catheter are noted in satisfactory position. Cardiac shadow is stable. Aortic calcifications are seen. The previously seen vascular congestion and pulmonary edema has improved slightly in the interval from the prior exam. No bony abnormality is seen. IMPRESSION: Slight improvement in the degree of pulmonary edema when compare with the prior exam. Electronically Signed   By: Inez Catalina M.D.   On: 08/14/2019 03:35   Dg Chest Portable 1 View  Result Date: 07/23/2019 CLINICAL DATA:  Post intubation. EXAM: PORTABLE CHEST 1 VIEW COMPARISON:  Chest x-ray from same day. FINDINGS: Interval intubation with endotracheal tube in good position 2.5 cm above the carina. Unchanged cardiomegaly with diffuse interstitial opacities. New mid to lower lung patchy airspace disease, worse on the right. No pneumothorax or large  pleural effusion. No acute osseous abnormality. IMPRESSION: 1. Appropriately positioned endotracheal tube. 2. Worsening pulmonary edema. Electronically Signed   By: Titus Dubin M.D.   On: 08/18/2019 16:42   Dg Chest Port 1 View  Result Date: 07/29/2019 CLINICAL DATA:  Shortness of breath. Additional history provided: Generalized weakness and stomach upset. Dialysis patient with last treatment Saturday. EXAM: PORTABLE CHEST 1 VIEW COMPARISON:  Chest radiograph 08/05/2019 FINDINGS: Stable cardiomegaly.  Aortic atherosclerosis. As before, there are bilateral interstitial opacities with a mid to lower lung predominance. No evidence of pneumothorax or sizable pleural effusion. No acute bony abnormality. IMPRESSION: Stable cardiomegaly. Bilateral interstitial pulmonary opacities are similar to exam 08/05/2019. This may reflect interstitial edema or infection. Aortic atherosclerosis. Electronically Signed   By: Kellie Simmering DO   On: 07/22/2019 12:11   Dg Abd Portable 1v  Result Date: 08/10/2019 CLINICAL DATA:  Orogastric tube placement EXAM:  PORTABLE ABDOMEN - 1 VIEW COMPARISON:  06/26/2018 FINDINGS: NG tube in the body the stomach.  Normal bowel gas pattern. Bibasilar airspace disease. IMPRESSION: NG tube in the body the stomach.  Normal bowel gas pattern. Electronically Signed   By: Franchot Gallo M.D.   On: 07/23/2019 17:39     Medications:   . sodium chloride 5 mL/hr at 08/15/19 0300  . famotidine (PEPCID) IV Stopped (08/14/19 0024)  . fentaNYL infusion INTRAVENOUS 200 mcg/hr (08/15/19 0626)  . heparin 1,000 Units/hr (08/15/19 0300)  . propofol (DIPRIVAN) infusion 50 mcg/kg/min (08/15/19 0627)   . chlorhexidine gluconate (MEDLINE KIT)  15 mL Mouth Rinse BID  . Chlorhexidine Gluconate Cloth  6 each Topical Q0600  . fentaNYL (SUBLIMAZE) injection  25 mcg Intravenous Once  . ipratropium-albuterol  3 mL Nebulization Q4H  . mouth rinse  15 mL Mouth Rinse 10 times per day  . methylPREDNISolone  (SOLU-MEDROL) injection  20 mg Intravenous Q12H  . sodium chloride flush  3 mL Intravenous Q12H   sodium chloride, acetaminophen, fentaNYL, fentaNYL (SUBLIMAZE) injection, fentaNYL (SUBLIMAZE) injection, midazolam, midazolam, ondansetron (ZOFRAN) IV, sodium chloride flush  Assessment/ Plan:  68 y.o. female with stage renal disease on hemodialysis, coronary artery disease with history of CABG 2012 and PCI, peripheral arterial disease, history of right femoral-popliteal revascularization, COPD, hypertension, hyperlipidemia, diabetes type 2, history of stroke, history of anxiety and depression, obstructive sleep apnea admitted for evaluation of chest pain  Active Problems:   Respiratory failure ([place])   #. ESRD  [facility]// TuThSa-1//TW//98kg//[facility] -Patient will be due for another dialysis session today.  Orders have been prepared.  Ultrafiltration target 2 kg.  #. Anemia of CKD  Lab Results  Component Value Date   HGB 7.6 (L) 08/15/2019  Hemoglobin down to 7.6.  Start the patient on Epogen 10,000 units IV with dialysis.  #. SHPTH     Component Value Date/Time   PTH 472 (H) 07/06/2019 1455   Lab Results  Component Value Date   PHOS 6.5 (H) 08/15/2019  Phosphorus high at 6.5.  Continue to monitor serum phosphorus closely.  Off binders given intubation now.Marland Kitchen    #Acute respiratory failure. Continue mechanical ventilation and weaning as per pulmonary/critical care.     LOS: 2 Kimberly Shaffer 11/26/20209:43 AM  [place], [place]

## 2019-08-15 NOTE — Progress Notes (Signed)
ANTICOAGULATION CONSULT NOTE - Initial Consult  Pharmacy Consult for Heparin Indication: chest pain/ACS  Patient Measurements: Height: 5\' 6"  (167.6 cm) Weight: 217 lb 9.5 oz (98.7 kg) IBW/kg (Calculated) : 59.3 HEPARIN DW (KG): 82.2  Vital Signs: Temp: 97.9 F (36.6 C) (11/26 0400) Temp Source: Oral (11/26 0400) BP: 113/65 (11/26 0500) Pulse Rate: 90 (11/26 0500)  Labs: Recent Labs    08/05/2019 1043 08/16/2019 1253 08/14/19 0002 08/14/19 1628 08/14/19 2202 08/15/19 0620  HGB 8.5*  --  8.2*  --   --  7.6*  HCT 27.1*  --  24.7*  --   --  24.3*  PLT 212  --  195  --   --  209  APTT  --   --   --   --  37*  --   LABPROT  --   --   --   --  13.9  --   INR  --   --   --   --  1.1  --   HEPARINUNFRC  --   --   --   --   --  0.34  CREATININE 9.52*  --  10.24*  --   --  6.32*  TROPONINIHS 25* 39*  --  234*  --   --     Estimated Creatinine Clearance: 10.1 mL/min (A) (by C-G formula based on SCr of 6.32 mg/dL (H)).  Medical History: Past Medical History:  Diagnosis Date  . Asthma   . CAD (coronary artery disease)   . Chronic lower back pain   . COPD (chronic obstructive pulmonary disease) ([place])   . Depression   . Diabetes mellitus without complication (HCC)    NIDD  . H/O angina pectoris   . H/O blood clots   . Hypercholesteremia   . Hypertension   . Left rotator cuff tear   . Myocardial infarction ([place])   . Obesity   . Renal insufficiency   . Stroke ([place])   . Vertigo, aural    Medications:  Medications Prior to Admission  Medication Sig Dispense Refill Last Dose  . amLODipine (NORVASC) 10 MG tablet Take 10 mg by mouth daily.   08/12/2019 at Unknown time  . aspirin 81 MG chewable tablet Chew 81 mg by mouth daily.   08/12/2019 at Unknown time  . atorvastatin (LIPITOR) 80 MG tablet Take 80 mg by mouth at bedtime.    08/12/2019 at Unknown time  . cetirizine (ZYRTEC) 10 MG tablet Take 10 mg by mouth daily.   08/12/2019 at Unknown time  . cinacalcet (SENSIPAR) 30 MG  tablet Take 1 tablet (30 mg total) by mouth daily before breakfast. 60 tablet  08/12/2019 at Unknown time  . clopidogrel (PLAVIX) 75 MG tablet Take 1 tablet (75 mg total) by mouth daily.   08/12/2019 at Unknown time  . fluticasone (FLONASE) 50 MCG/ACT nasal spray Place 2 sprays into both nostrils 2 (two) times daily.   08/12/2019 at Unknown time  . fluticasone furoate-vilanterol (BREO ELLIPTA) 200-25 MCG/INH AEPB Inhale 1 puff into the lungs daily.   08/12/2019 at Unknown time  . folic acid (FOLVITE) 1 MG tablet Take 1 mg by mouth daily.   08/12/2019 at Unknown time  . isosorbide mononitrate (IMDUR) 30 MG 24 hr tablet Take 30 mg by mouth daily.   08/12/2019 at Unknown time  . meclizine (ANTIVERT) 12.5 MG tablet Take 12.5 mg by mouth 3 (three) times daily as needed for dizziness.    08/12/2019 at Unknown time  .  metoprolol succinate (TOPROL-XL) 100 MG 24 hr tablet Take 1 tablet (100 mg total) by mouth daily with breakfast. Take with or immediately following a meal.   08/12/2019 at Unknown time  . mirtazapine (REMERON) 7.5 MG tablet Take 7.5 mg by mouth at bedtime.   08/12/2019 at Unknown time  . multivitamin (RENA-VIT) TABS tablet Take 1 tablet by mouth at bedtime.  0 08/12/2019 at Unknown time  . nitroGLYCERIN (NITROLINGUAL) 0.4 MG/SPRAY spray Place 1 spray under the tongue as needed.   prn at prn  . Oxycodone HCl 10 MG TABS Take 10 mg by mouth See admin instructions. Take 1 tablet (10mg ) by mouth 4 to 5 times daily as needed for severe pain   08/12/2019 at Unknown time  . pantoprazole (PROTONIX) 40 MG tablet Take 40 mg by mouth 2 (two) times daily.   08/12/2019 at Unknown time  . ranolazine (RANEXA) 1000 MG SR tablet Take 1 tablet (1,000 mg total) by mouth 2 (two) times daily. 60 tablet 1 08/12/2019 at Unknown time  . sevelamer carbonate (RENVELA) 800 MG tablet Take 2,400 mg by mouth 3 (three) times daily with meals.    08/12/2019 at Unknown time  . torsemide (DEMADEX) 100 MG tablet Take 100 mg by  mouth daily.   08/12/2019 at Unknown time  . acetaminophen (TYLENOL) 325 MG tablet Take 2 tablets (650 mg total) by mouth every 6 (six) hours as needed for mild pain (or Fever >/= 101).   prn at prn  . albuterol (PROVENTIL) (2.5 MG/3ML) 0.083% nebulizer solution Take 3 mLs (2.5 mg total) by nebulization every 4 (four) hours as needed for wheezing or shortness of breath. 75 mL 12 prn at prn  . albuterol (VENTOLIN HFA) 108 (90 Base) MCG/ACT inhaler Inhale 1 puff into the lungs every 6 (six) hours as needed for wheezing or shortness of breath.   prn at prn  . calcium carbonate (TUMS - DOSED IN MG ELEMENTAL CALCIUM) 500 MG chewable tablet Chew 2 tablets by mouth 3 (three) times daily.   prn at prn  . lidocaine-prilocaine (EMLA) cream Apply 1 application topically as needed (topical anesthesia for hemodialysis if Gebauers and Lidocaine injection are ineffective.). 30 g 0 prn at prn  . Nutritional Supplements (FEEDING SUPPLEMENT, NEPRO CARB STEADY,) LIQD Take 237 mLs by mouth daily. 237 mL 0    Assessment: 68 y.o.femalewith past medical history of CAD status post CABG, hypertension, hyperlipidemia, diabetes, COPDon 2L [place], and ESRD on HD who presents to the ED complaining of shortness of breath and generalized weakness.  Pharmacy asked to initiate and monitor Heparin for ACS c/w NSTEMI, 1st HS troponin 234, Hgb low at baseline, PLT wnl, baseline INR 1.1, aPTT 37s  Heparin Course: 11/25 pm initiation: 2500 unit bolus, then 1000 units/hr 11/26 0620 HL 0.34: continue  Goal of Therapy:  Heparin level 0.3-0.7 units/ml Monitor platelets by anticoagulation protocol: Yes   Plan:   Continue heparin infusion at 1000 units/hr  Check confirmatory heparin level in 8 hours  CBC in am  Dallie Piles 08/15/2019,7:55 AM

## 2019-08-15 NOTE — Progress Notes (Signed)
Pre HD Tx Note Pt is ICU01. I arrived to initiate and deliver HD Tx as per Dr Holley Raring order. Pt is currently receiving Fenatnyl 229mcg/hr and Propofol 40/mcg/kg/min. Pt BP WDL to start Tx\. Left upper Arm fistula WDL. Temp is noticed 100.6. Primary RN is made aware.    08/15/19 1145  Hand-Off documentation  Report given to (Full Name) Newt Minion RN   Report received from (Full Name) Melford Aase RN  Vital Signs  Temp (!) 100.6 F (38.1 C)  Temp Source Axillary  Pulse Rate (!) 105  Resp (!) 24  BP 106/67  Oxygen Therapy  SpO2 99 %  O2 Device Ventilator  FiO2 (%) 35 %  Patient Activity (if Appropriate) In bed  Pulse Oximetry Type Continuous  SpO2 Alarm Limit Low 97  Oximetry Probe Site Changed No  End [place] CO2 (EtCO2) 36  Pain Assessment  Pain Scale 0-10  Pain Score  (Unable to Assess Sedated)  Critical Care Pain Observation Tool (CPOT)  Facial Expression 2  Body Movements 2  Muscle Tension 0  Compliance with ventilator (intubated pts.) 0  Vocalization (extubated pts.) N/A  CPOT Total 4  Dialysis Weight  Weight 98.7 kg  Type of Weight Pre-Dialysis  Time-Out for Hemodialysis  What Procedure? HD   Pt Identifiers(min of two) First/Last Name;MRN/Account#  Correct Site? Yes  Correct Side? Yes  Correct Procedure? Yes  Consents Verified? Yes  Safety Precautions Reviewed? Yes  Engineer, civil (consulting) Number 6  Station Number  (ICU 1)  UF/Alarm Test Passed  Conductivity: Meter 13.8  Conductivity: Machine  13.9  pH 7.4  Reverse Osmosis  (RO 4)  Normal Saline Lot Number [id number]  Dialyzer Lot Number [id number]  Disposable Set Lot Number [id number]  Machine Temperature 98.8 F (37.1 C)  Musician and Audible Yes  Blood Lines Intact and Secured Yes  Pre Treatment Patient Checks  Vascular access used during treatment Fistula  HD catheter dressing before treatment WDL  Patient is receiving dialysis in a chair  (in bed)  Hepatitis B Surface Antigen Results Negative   Date Hepatitis B Surface Antigen Drawn 08/14/19  Hepatitis B Surface Antibody  (<10)  Date Hepatitis B Surface Antibody Drawn 08/14/19  Hemodialysis Consent Verified Yes  Hemodialysis Standing Orders Initiated Yes  ECG (Telemetry) Monitor On Yes  Prime Ordered Normal Saline  Length of  DialysisTreatment -hour(s) 3.5 Hour(s)  Dialysis Treatment Comments  (Na140)  Dialyzer Elisio 17H NR  Dialysate 2K;2.5 Ca  Dialysis Anticoagulant None  Dialysate Flow Ordered 800  Blood Flow Rate Ordered 400 mL/min  Ultrafiltration Goal 2 Liters  Pre Treatment Labs Phosphorus  Dialysis Blood Pressure Support Ordered Albumin  Education / Care Plan  Dialysis Education Provided Yes  Documented Education in Care Plan Yes  Fistula / Graft Left Upper arm Arteriovenous fistula  No Placement Date or Time found.   Placed prior to admission: Yes  Orientation: Left  Access Location: Upper arm  Access Type: Arteriovenous fistula  Site Condition No complications  Fistula / Graft Assessment Present;Thrill;Bruit  Status Accessed  Needle Size 15  Drainage Description None

## 2019-08-15 NOTE — Progress Notes (Signed)
Pre HD Assessment   08/15/19 1145  Neurological  Level of Consciousness Responds to Voice  Orientation Level Intubated/Tracheostomy - Unable to assess  Respiratory  Respiratory Pattern Regular;Unlabored  Chest Assessment Chest expansion symmetrical  Bilateral Breath Sounds Expiratory wheezes;Diminished  Cardiac  Pulse Regular  Heart Sounds S1, S2;Distant  Jugular Venous Distention (JVD) No  ECG Monitor Yes  Cardiac Rhythm NSR  Heart Block Type 1st degree AVB  Ectopy Unifocal PVC's  Antiarrhythmic device No  Vascular  R Radial Pulse +2  L Radial Pulse +2  R Dorsalis Pedis Pulse +1  L Dorsalis Pedis Pulse +1  Edema Generalized  RLE Edema +1  LLE Edema +1  Integumentary  Integumentary (WDL) X  Skin Color Appropriate for ethnicity  Skin Condition Dry;Flaky  Musculoskeletal  Musculoskeletal (WDL) X  Generalized Weakness Yes  Gastrointestinal  Bowel Sounds Assessment Hypoactive  GU Assessment  Genitourinary (WDL) X  Genitourinary Symptoms Urinary Catheter  Urine Characteristics  Urine Color Amber  Urine Appearance Clear  Psychosocial  Psychosocial (WDL) X  Patient Behaviors Not interactive (sedated)  Emotional support given Given to patient

## 2019-08-15 NOTE — Plan of Care (Signed)

## 2019-08-15 NOTE — Progress Notes (Signed)
Post HD Tx Note Pt tolerated well the Tx. The prescribed 2528ml has been reached. Pt rTx run for3.5hrs. on 2K2.5Ca. Pt received 10K Epo as PO. Pt continues to receive Fentanyl, Propofol and heparin drips.    08/15/19 1545  Hand-Off documentation  Report given to (Full Name) Melford Aase RN   Report received from (Full Name) Newt Minion   Vital Signs  Temp 99.2 F (37.3 C)  Temp Source Axillary  Pulse Rate (!) 108  Pulse Rate Source Monitor  Resp (!) 23  BP 123/82  BP Location Right Arm  BP Method Automatic  Patient Position (if appropriate) Lying  Oxygen Therapy  SpO2 96 %  O2 Device Ventilator  FiO2 (%) 35 %  End Tidal CO2 (EtCO2) 39  Pain Assessment  Pain Scale Faces  Pain Score 0  Post-Hemodialysis Assessment  Rinseback Volume (mL) 250 mL  KECN 78.8 V  Dialyzer Clearance Heavily streaked  Duration of HD Treatment -hour(s) 3.5 hour(s)  Hemodialysis Intake (mL) 500 mL  UF Total -Machine (mL) 2506 mL  Net UF (mL) 2006 mL  Tolerated HD Treatment Yes  AVG/AVF Arterial Site Held (minutes) 8 minutes  AVG/AVF Venous Site Held (minutes) 7 minutes  Fistula / Graft Left Upper arm Arteriovenous fistula  No Placement Date or Time found.   Placed prior to admission: Yes  Orientation: Left  Access Location: Upper arm  Access Type: Arteriovenous fistula  Site Condition No complications  Fistula / Graft Assessment Present;Thrill;Bruit  Status Deaccessed  Drainage Description None

## 2019-08-15 NOTE — Progress Notes (Signed)
Post HD Assessment   08/15/19 1530  Neurological  Level of Consciousness Responds to Voice  Orientation Level Intubated/Tracheostomy - Unable to assess  Respiratory  Respiratory Pattern Regular;Unlabored  Chest Assessment Chest expansion symmetrical  Bilateral Breath Sounds Fine crackles  Cardiac  Pulse Irregular  Heart Sounds S1, S2  Jugular Venous Distention (JVD) No  ECG Monitor Yes  Cardiac Rhythm ST;VT (Not Sustained)  Heart Block Type 1st degree AVB  Ectopy Unifocal PVC's  Antiarrhythmic device No  Vascular  R Radial Pulse +2  L Radial Pulse +2  R Dorsalis Pedis Pulse +1  L Dorsalis Pedis Pulse +1  Edema Generalized  RLE Edema +1  LLE Edema +1  Integumentary  Integumentary (WDL) X  Skin Color Appropriate for ethnicity  Skin Condition Dry;Flaky  Musculoskeletal  Musculoskeletal (WDL) X  Generalized Weakness Yes  Gastrointestinal  Bowel Sounds Assessment Hypoactive  GU Assessment  Genitourinary (WDL) X  Genitourinary Symptoms Urinary Catheter  Urine Characteristics  Urine Color Amber  Urine Appearance Clear  Psychosocial  Psychosocial (WDL) X  Patient Behaviors Not interactive (sedated)  Emotional support given Given to patient

## 2019-08-16 ENCOUNTER — Inpatient Hospital Stay: Payer: Medicare Other

## 2019-08-16 DIAGNOSIS — I5021 Acute systolic (congestive) heart failure: Secondary | ICD-10-CM

## 2019-08-16 LAB — RENAL FUNCTION PANEL
Albumin: 3.3 g/dL — ABNORMAL LOW (ref 3.5–5.0)
Anion gap: 18 — ABNORMAL HIGH (ref 5–15)
BUN: 25 mg/dL — ABNORMAL HIGH (ref 8–23)
CO2: 25 mmol/L (ref 22–32)
Calcium: 8.5 mg/dL — ABNORMAL LOW (ref 8.9–10.3)
Chloride: 94 mmol/L — ABNORMAL LOW (ref 98–111)
Creatinine, Ser: 4.09 mg/dL — ABNORMAL HIGH (ref 0.44–1.00)
GFR calc Af Amer: 12 mL/min — ABNORMAL LOW (ref >60)
GFR calc non Af Amer: 11 mL/min — ABNORMAL LOW (ref >60)
Glucose, Bld: 103 mg/dL — ABNORMAL HIGH (ref 70–99)
Phosphorus: 5.1 mg/dL — ABNORMAL HIGH (ref 2.5–4.6)
Potassium: 3.1 mmol/L — ABNORMAL LOW (ref 3.5–5.1)
Sodium: 137 mmol/L (ref 135–145)

## 2019-08-16 LAB — CBC
HCT: 24.9 % — ABNORMAL LOW (ref 36.0–46.0)
Hemoglobin: 7.8 g/dL — ABNORMAL LOW (ref 12.0–15.0)
MCH: 31.5 pg (ref 26.0–34.0)
MCHC: 31.3 g/dL (ref 30.0–36.0)
MCV: 100.4 fL — ABNORMAL HIGH (ref 80.0–100.0)
Platelets: 196 10*3/uL (ref 150–400)
RBC: 2.48 MIL/uL — ABNORMAL LOW (ref 3.87–5.11)
RDW: 14.4 % (ref 11.5–15.5)
WBC: 10.6 10*3/uL — ABNORMAL HIGH (ref 4.0–10.5)
nRBC: 0.5 % — ABNORMAL HIGH (ref 0.0–0.2)

## 2019-08-16 LAB — GLUCOSE, CAPILLARY
Glucose-Capillary: 102 mg/dL — ABNORMAL HIGH (ref 70–99)
Glucose-Capillary: 106 mg/dL — ABNORMAL HIGH (ref 70–99)
Glucose-Capillary: 110 mg/dL — ABNORMAL HIGH (ref 70–99)
Glucose-Capillary: 112 mg/dL — ABNORMAL HIGH (ref 70–99)
Glucose-Capillary: 113 mg/dL — ABNORMAL HIGH (ref 70–99)
Glucose-Capillary: 94 mg/dL (ref 70–99)
Glucose-Capillary: 99 mg/dL (ref 70–99)

## 2019-08-16 LAB — HEPARIN LEVEL (UNFRACTIONATED): Heparin Unfractionated: 0.22 IU/mL — ABNORMAL LOW (ref 0.30–0.70)

## 2019-08-16 LAB — TRIGLYCERIDES: Triglycerides: 131 mg/dL (ref <150)

## 2019-08-16 MED ORDER — METOPROLOL TARTRATE 25 MG PO TABS
25.0000 mg | ORAL_TABLET | Freq: Two times a day (BID) | ORAL | Status: DC
  Administered 2019-08-16 – 2019-08-19 (×5): 25 mg
  Filled 2019-08-16 (×5): qty 1

## 2019-08-16 MED ORDER — PANTOPRAZOLE SODIUM 40 MG IV SOLR
40.0000 mg | Freq: Two times a day (BID) | INTRAVENOUS | Status: DC
  Administered 2019-08-16 – 2019-08-21 (×12): 40 mg via INTRAVENOUS
  Filled 2019-08-16 (×13): qty 40

## 2019-08-16 NOTE — Progress Notes (Signed)
ANTICOAGULATION CONSULT NOTE   Pharmacy Consult for Heparin Indication: chest pain/ACS  Patient Measurements: Height: 5\' 6"  (167.6 cm) Weight: 217 lb 9.5 oz (98.7 kg) IBW/kg (Calculated) : 59.3 HEPARIN DW (KG): 82.2  Vital Signs: Temp: 98 F (36.7 C) (11/27 0000) Temp Source: Axillary (11/27 0000) BP: 109/60 (11/27 0500) Pulse Rate: 95 (11/27 0500)  Labs: Recent Labs    07/21/2019 1043 08/02/2019 1253 08/14/19 0002 08/14/19 1628 08/14/19 2202 08/15/19 0620 08/15/19 1445 08/16/19 0518  HGB 8.5*  --  8.2*  --   --  7.6*  --  7.8*  HCT 27.1*  --  24.7*  --   --  24.3*  --  24.9*  PLT 212  --  195  --   --  209  --  196  APTT  --   --   --   --  37*  --   --   --   LABPROT  --   --   --   --  13.9  --   --   --   INR  --   --   --   --  1.1  --   --   --   HEPARINUNFRC  --   --   --   --   --  0.34 0.48 0.22*  CREATININE 9.52*  --  10.24*  --   --  6.32*  --   --   TROPONINIHS 25* 39*  --  234*  --   --   --   --     Estimated Creatinine Clearance: 10.1 mL/min (A) (by C-G formula based on SCr of 6.32 mg/dL (H)).  Medical History: Past Medical History:  Diagnosis Date  . Asthma   . CAD (coronary artery disease)   . Chronic lower back pain   . COPD (chronic obstructive pulmonary disease) ([place])   . Depression   . Diabetes mellitus without complication (HCC)    NIDD  . H/O angina pectoris   . H/O blood clots   . Hypercholesteremia   . Hypertension   . Left rotator cuff tear   . Myocardial infarction ([place])   . Obesity   . Renal insufficiency   . Stroke ([place])   . Vertigo, aural    Medications:  Medications Prior to Admission  Medication Sig Dispense Refill Last Dose  . amLODipine (NORVASC) 10 MG tablet Take 10 mg by mouth daily.   08/12/2019 at Unknown time  . aspirin 81 MG chewable tablet Chew 81 mg by mouth daily.   08/12/2019 at Unknown time  . atorvastatin (LIPITOR) 80 MG tablet Take 80 mg by mouth at bedtime.    08/12/2019 at Unknown time  . cetirizine  (ZYRTEC) 10 MG tablet Take 10 mg by mouth daily.   08/12/2019 at Unknown time  . cinacalcet (SENSIPAR) 30 MG tablet Take 1 tablet (30 mg total) by mouth daily before breakfast. 60 tablet  08/12/2019 at Unknown time  . clopidogrel (PLAVIX) 75 MG tablet Take 1 tablet (75 mg total) by mouth daily.   08/12/2019 at Unknown time  . fluticasone (FLONASE) 50 MCG/ACT nasal spray Place 2 sprays into both nostrils 2 (two) times daily.   08/12/2019 at Unknown time  . fluticasone furoate-vilanterol (BREO ELLIPTA) 200-25 MCG/INH AEPB Inhale 1 puff into the lungs daily.   08/12/2019 at Unknown time  . folic acid (FOLVITE) 1 MG tablet Take 1 mg by mouth daily.   08/12/2019 at Unknown time  . isosorbide  mononitrate (IMDUR) 30 MG 24 hr tablet Take 30 mg by mouth daily.   08/12/2019 at Unknown time  . meclizine (ANTIVERT) 12.5 MG tablet Take 12.5 mg by mouth 3 (three) times daily as needed for dizziness.    08/12/2019 at Unknown time  . metoprolol succinate (TOPROL-XL) 100 MG 24 hr tablet Take 1 tablet (100 mg total) by mouth daily with breakfast. Take with or immediately following a meal.   08/12/2019 at Unknown time  . mirtazapine (REMERON) 7.5 MG tablet Take 7.5 mg by mouth at bedtime.   08/12/2019 at Unknown time  . multivitamin (RENA-VIT) TABS tablet Take 1 tablet by mouth at bedtime.  0 08/12/2019 at Unknown time  . nitroGLYCERIN (NITROLINGUAL) 0.4 MG/SPRAY spray Place 1 spray under the tongue as needed.   prn at prn  . Oxycodone HCl 10 MG TABS Take 10 mg by mouth See admin instructions. Take 1 tablet (10mg ) by mouth 4 to 5 times daily as needed for severe pain   08/12/2019 at Unknown time  . pantoprazole (PROTONIX) 40 MG tablet Take 40 mg by mouth 2 (two) times daily.   08/12/2019 at Unknown time  . ranolazine (RANEXA) 1000 MG SR tablet Take 1 tablet (1,000 mg total) by mouth 2 (two) times daily. 60 tablet 1 08/12/2019 at Unknown time  . sevelamer carbonate (RENVELA) 800 MG tablet Take 2,400 mg by mouth 3 (three)  times daily with meals.    08/12/2019 at Unknown time  . torsemide (DEMADEX) 100 MG tablet Take 100 mg by mouth daily.   08/12/2019 at Unknown time  . acetaminophen (TYLENOL) 325 MG tablet Take 2 tablets (650 mg total) by mouth every 6 (six) hours as needed for mild pain (or Fever >/= 101).   prn at prn  . albuterol (PROVENTIL) (2.5 MG/3ML) 0.083% nebulizer solution Take 3 mLs (2.5 mg total) by nebulization every 4 (four) hours as needed for wheezing or shortness of breath. 75 mL 12 prn at prn  . albuterol (VENTOLIN HFA) 108 (90 Base) MCG/ACT inhaler Inhale 1 puff into the lungs every 6 (six) hours as needed for wheezing or shortness of breath.   prn at prn  . calcium carbonate (TUMS - DOSED IN MG ELEMENTAL CALCIUM) 500 MG chewable tablet Chew 2 tablets by mouth 3 (three) times daily.   prn at prn  . lidocaine-prilocaine (EMLA) cream Apply 1 application topically as needed (topical anesthesia for hemodialysis if Gebauers and Lidocaine injection are ineffective.). 30 g 0 prn at prn  . Nutritional Supplements (FEEDING SUPPLEMENT, NEPRO CARB STEADY,) LIQD Take 237 mLs by mouth daily. 237 mL 0    Assessment: 68 y.o.femalewith past medical history of CAD status post CABG, hypertension, hyperlipidemia, diabetes, COPDon 2L [place], and ESRD on HD who presents to the ED complaining of shortness of breath and generalized weakness.  Pharmacy asked to initiate and monitor Heparin for ACS c/w NSTEMI, 1st HS troponin 234, Hgb low at baseline, PLT wnl, baseline INR 1.1, aPTT 37s  Heparin Course: 11/25 pm initiation: 2500 unit bolus, then 1000 units/hr 11/26 0620 HL 0.34 therapeutic x 1 11/26 1445 HL 0.48 therapeutic x 2 11/27 0518 HL 0.22 subtherapeutic, confirmed w/ RN no issues w/ infusion, CBC stable  Goal of Therapy:  Heparin level 0.3-0.7 units/ml Monitor platelets by anticoagulation protocol: Yes   Plan:   Increase heparin infusion to 1200 units/hr, recheck heparin level in 8 hours  Check heparin  level/CBC daily per protocol  Ena Dawley, PharmD Clinical Pharmacist 08/16/2019  6:13 AM

## 2019-08-16 NOTE — Progress Notes (Signed)
[place], [place] 08/16/19  Subjective:  Patient remains critically ill. She had coffee-ground noted in NG canister this a.m. Still on the ventilator. Patient is arousable and will nod head to yes/no questions.    Objective:  Vital signs in last 24 hours:  Temp:  [98 F (36.7 C)-100.3 F (37.9 C)] 100.3 F (37.9 C) (11/27 1200) Pulse Rate:  [83-125] 103 (11/27 1200) Resp:  [15-23] 23 (11/27 1200) BP: (85-139)/(50-82) 95/57 (11/27 1200) SpO2:  [94 %-100 %] 98 % (11/27 1200) FiO2 (%):  [30 %-35 %] 30 % (11/27 1114) Weight:  [98.6 kg] 98.6 kg (11/27 0500)  Weight change: -1.4 kg Filed Weights   08/15/19 0322 08/15/19 1145 08/16/19 0500  Weight: 98.7 kg 98.7 kg 98.6 kg    Intake/Output:    Intake/Output Summary (Last 24 hours) at 08/16/2019 1242 Last data filed at 08/16/2019 1100 Gross per 24 hour  Intake 848.21 ml  Output 2246 ml  Net -1397.79 ml     Physical Exam: General:  Critically ill appearing  HEENT  anicteric, moist oral mucous membranes, ETT in place  Pulm/lungs  bilateral rales and rhonchi, vent assisted  CVS/Heart  S1S2 no rubs  Abdomen:   Soft, nontender, BS present  Extremities:  Trace pitting edema  Neurologic:  Arousable, will follow simple commands.  Skin:  No acute rashes  Access:  Left arm AV fistula       Basic Metabolic Panel:  Recent Labs  Lab 08/11/2019 1043 08/14/19 0002 08/14/19 0944 08/15/19 0620 08/15/19 1445 08/16/19 0518  NA 135 138  --  137  --  137  K 4.4 5.0  --  3.8  --  3.1*  CL 98 102  --  96*  --  94*  CO2 21* 18*  --  22  --  25  GLUCOSE 119* 115*  --  98  --  103*  BUN 49* 57*  --  32*  --  25*  CREATININE 9.52* 10.24*  --  6.32*  --  4.09*  CALCIUM 7.9* 7.8*  --  7.9*  --  8.5*  MG  --  1.6*  --  1.9  --   --   PHOS  --  7.4* 6.7* 6.5* 2.6 5.1*     CBC: Recent Labs  Lab 08/16/2019 1043 08/14/19 0002 08/15/19 0620 08/16/19 0518  WBC 9.1 8.8 8.8 10.6*  NEUTROABS  --   --   6.9  --   HGB 8.5* 8.2* 7.6* 7.8*  HCT 27.1* 24.7* 24.3* 24.9*  MCV 102.7* 96.5 102.1* 100.4*  PLT 212 195 209 196      Lab Results  Component Value Date   HEPBSAG NON REACTIVE 08/14/2019   HEPBSAB Reactive 01/11/2018      Microbiology:  Recent Results (from the past 240 hour(s))  SARS Coronavirus 2 by RT PCR (hospital order, performed in [facility] hospital lab) Nasopharyngeal Nasopharyngeal Swab     Status: None   Collection Time: 08/08/2019 12:52 PM   Specimen: Nasopharyngeal Swab  Result Value Ref Range Status   SARS Coronavirus 2 NEGATIVE NEGATIVE Final    Comment: (NOTE) SARS-CoV-2 target nucleic acids are NOT DETECTED. The SARS-CoV-2 RNA is generally detectable in upper and lower respiratory specimens during the acute phase of infection. The lowest concentration of SARS-CoV-2 viral copies this assay can detect is 250 copies / mL. A negative result does not preclude SARS-CoV-2 infection and should not be used as the sole basis for treatment  or other patient management decisions.  A negative result may occur with improper specimen collection / handling, submission of specimen other than nasopharyngeal swab, presence of viral mutation(s) within the areas targeted by this assay, and inadequate number of viral copies (<250 copies / mL). A negative result must be combined with clinical observations, patient history, and epidemiological information. Fact Sheet for Patients:   StrictlyIdeas.no Fact Sheet for Healthcare Providers: BankingDealers.co.za This test is not yet approved or cleared  by the [place] FDA and has been authorized for detection and/or diagnosis of SARS-CoV-2 by FDA under an Emergency Use Authorization (EUA).  This EUA will remain in effect (meaning this test can be used) for the duration of the COVID-19 declaration under Section 564(b)(1) of the Act, 21 U.S.C. section 360bbb-3(b)(1), unless the  authorization is terminated or revoked sooner. Performed at [facility], [address] [place], [place] [address]   Urine culture     Status: Abnormal   Collection Time: 08/12/2019  1:38 PM   Specimen: Urine, Random  Result Value Ref Range Status   Specimen Description   Final    URINE, RANDOM Performed at [facility], [address] [place], [place] [address]    Special Requests   Final    NONE Performed at [facility], [place], [place] [address]    Culture 20,000 COLONIES/mL STAPHYLOCOCCUS AUREUS (A)  Final   Report Status 08/15/2019 FINAL  Final   Organism ID, Bacteria STAPHYLOCOCCUS AUREUS (A)  Final      Susceptibility   Staphylococcus aureus - MIC*    CIPROFLOXACIN <=0.5 SENSITIVE Sensitive     GENTAMICIN <=0.5 SENSITIVE Sensitive     NITROFURANTOIN <=16 SENSITIVE Sensitive     OXACILLIN 1 SENSITIVE Sensitive     TETRACYCLINE <=1 SENSITIVE Sensitive     VANCOMYCIN <=0.5 SENSITIVE Sensitive     TRIMETH/SULFA <=10 SENSITIVE Sensitive     CLINDAMYCIN <=0.25 SENSITIVE Sensitive     RIFAMPIN <=0.5 SENSITIVE Sensitive     Inducible Clindamycin NEGATIVE Sensitive     * 20,000 COLONIES/mL STAPHYLOCOCCUS AUREUS  MRSA PCR Screening     Status: None   Collection Time: 08/12/2019 11:05 PM   Specimen: Nasal Mucosa; Nasopharyngeal  Result Value Ref Range Status   MRSA by PCR NEGATIVE NEGATIVE Final    Comment:        The GeneXpert MRSA Assay (FDA approved for NASAL specimens only), is one component of a comprehensive MRSA colonization surveillance program. It is not intended to diagnose MRSA infection nor to guide or monitor treatment for MRSA infections. Performed at [facility], [address] [place],  [address]     Coagulation Studies: Recent Labs    08/14/19 12/18/2200  LABPROT 13.9  INR 1.1    Urinalysis: No results for input(s): COLORURINE, LABSPEC, PHURINE, GLUCOSEU, HGBUR, BILIRUBINUR,  KETONESUR, PROTEINUR, UROBILINOGEN, NITRITE, LEUKOCYTESUR in the last 72 hours.  Invalid input(s): APPERANCEUR    Imaging: Ct Head Wo Contrast  Result Date: 08/14/2019 CLINICAL DATA:  Ataxia, stroke suspected. Additional history provided: Patient admitted yesterday for acute respiratory failure. EXAM: CT HEAD WITHOUT CONTRAST TECHNIQUE: Contiguous axial images were obtained from the base of the skull through the vertex without intravenous contrast. COMPARISON:  CTA head 09/25/2018 FINDINGS: Brain: No evidence of acute intracranial hemorrhage. No demarcated cortical infarction. No evidence of intracranial mass. No midline shift or extra-axial fluid collection. Redemonstrated advanced chronic small vessel ischemic disease. This includes prominent chronic small vessel ischemic  changes within the bilateral basal ganglia, thalami and within the pons. Mild generalized parenchymal atrophy. Vascular: No hyperdense vessel.  Atherosclerotic calcifications. Skull: Normal. Negative for fracture or focal lesion. Sinuses/Orbits: Visualized orbits demonstrate no acute abnormality. Extensive partial opacification of the visualized right maxillary sinus. Secretions are also present within the pharynx. No significant mastoid effusion. IMPRESSION: 1. No CT evidence of acute intracranial abnormality. 2. Stable generalized parenchymal atrophy and advanced chronic small vessel ischemic disease. 3. Extensive partial opacification of the partially imaged right maxillary sinus. Correlate for acute sinusitis. Electronically Signed   By: Kellie Simmering DO   On: 08/14/2019 21:00   Dg Chest Port 1 View  Result Date: 08/16/2019 CLINICAL DATA:  Acute respiratory failure EXAM: PORTABLE CHEST 1 VIEW COMPARISON:  Radiograph 08/14/2019 FINDINGS: Endotracheal tube in the mid trachea, approximately 5.2 cm from the carina. Transesophageal tube tip and side port distal to the GE junction. Continued improvement in the bilateral interstitial  and airspace opacity with central venous congestion. Cardiomegaly stable from prior. Aorta is calcified. Small left effusion remains. IMPRESSION: Features likely reflect improving pulmonary edema when compared to prior. Trace residual left effusion. Aortic Atherosclerosis (ICD10-I70.0). Satisfactory positioning of lines and tubes. Electronically Signed   By: Lovena Le M.D.   On: 08/16/2019 02:46     Medications:   . sodium chloride 250 mL (08/15/19 2144)  . fentaNYL infusion INTRAVENOUS 75 mcg/hr (08/16/19 1208)  . propofol (DIPRIVAN) infusion Stopped (08/16/19 0826)   . chlorhexidine gluconate (MEDLINE KIT)  15 mL Mouth Rinse BID  . Chlorhexidine Gluconate Cloth  6 each Topical Q0600  . epoetin (EPOGEN/PROCRIT) injection  10,000 Units Intravenous Q T,Th,Sa-HD  . ipratropium-albuterol  3 mL Nebulization Q4H  . mouth rinse  15 mL Mouth Rinse 10 times per day  . methylPREDNISolone (SOLU-MEDROL) injection  20 mg Intravenous Q12H  . metoprolol tartrate  12.5 mg Per Tube BID  . pantoprazole (PROTONIX) IV  40 mg Intravenous BID  . sodium chloride flush  3 mL Intravenous Q12H   sodium chloride, acetaminophen, fentaNYL, fentaNYL (SUBLIMAZE) injection, fentaNYL (SUBLIMAZE) injection, midazolam, midazolam, ondansetron (ZOFRAN) IV, sodium chloride flush  Assessment/ Plan:  68 y.o. female with stage renal disease on hemodialysis, coronary artery disease with history of CABG 2012 and PCI, peripheral arterial disease, history of right femoral-popliteal revascularization, COPD, hypertension, hyperlipidemia, diabetes type 2, history of stroke, history of anxiety and depression, obstructive sleep apnea admitted for evaluation of chest pain  Active Problems:   Respiratory failure ([place])   #. ESRD  [facility]// TuThSa-1//TW//98kg//[facility] -Patient completed dialysis yesterday.  No urgent indication for dialysis today.  We will plan for hemodialysis again tomorrow.  #. Anemia of CKD  Lab  Results  Component Value Date   HGB 7.8 (L) 08/16/2019  Continue Epogen 10,000 units IV with dialysis treatments.  #. SHPTH     Component Value Date/Time   PTH 472 (H) 07/06/2019 1455   Lab Results  Component Value Date   PHOS 5.1 (H) 08/16/2019  Phosphorus down to 5.1 now.  Currently on phosphorus binders.  Continue to monitor.   #Acute respiratory failure. Weaning from the ventilator as per pulmonary/critical care.     LOS: 3 Takeysha Bonk 11/27/202012:42 PM  [place], [place]

## 2019-08-16 NOTE — Progress Notes (Addendum)
Follow up - Critical Care Medicine Note  Patient Details:    Kimberly Shaffer is an 68 y.o. femalewith past medical history of CAD status post CABG, hypertension, hyperlipidemia, diabetes, COPDon 2L [place], and ESRD on HD who presents to the ED complaining of shortness of breath and generalized weakness -patient with severe hypoxia progressive resp failure placed on BiPAP She has ESRD on HD last HD was Saturday Failed BiPAP and now intubated and placed on MV support  Lines, Airways, Drains: Airway 7.5 mm (Active)  Secured at (cm) 23 cm 08/15/19 0824  Measured From Lips 08/15/19 0824  Secured Location Right 08/15/19 0824  Secured By [employer] 08/15/19 0824  Tube Holder Repositioned Yes 08/15/19 0824  Cuff Pressure (cm H2O) 26 cm H2O 08/15/19 0824  Site Condition Cool;Dry 08/15/19 0824     NG/OG Tube Orogastric 18 Fr. Center mouth Xray (Active)  Cm Marking at Nare/Corner of Mouth (if applicable) 56 cm [id number]  Site Assessment Clean;Dry;Intact 08/15/19 0800  Ongoing Placement Verification No change in cm markings or external length of tube from initial placement;No change in respiratory status;No acute changes, not attributed to clinical condition 08/15/19 0800  Status Suction-low intermittent 08/15/19 0800  Drainage Appearance Bile 08/15/19 0400  Output (mL) 150 mL 08/14/19 2000     Urethral Catheter Sherie RN Latex 16 Fr. (Active)  Indication for Insertion or Continuance of Catheter Therapy based on hourly urine output monitoring and documentation for critical condition (NOT STRICT I&O) 08/15/19 0800  Site Assessment Clean;Intact;Dry 08/15/19 0800  Catheter Maintenance Bag below level of bladder;Catheter secured;Drainage bag/tubing not touching floor;Insertion date on drainage bag;No dependent loops;Seal intact 08/15/19 0800  Collection Container Standard drainage bag 08/15/19 0800  Securement Method Securing device (Describe) 08/15/19 0800  Urinary Catheter  Interventions (if applicable) Unclamped [id number]  Output (mL) 15 mL 08/15/19 0400    Anti-infectives:  Anti-infectives (From admission, onward)   Start     Dose/Rate Route Frequency Ordered Stop   08/17/2019 1345  cefTRIAXone (ROCEPHIN) 1 g in sodium chloride 0.9 % 100 mL IVPB     1 g 200 mL/hr over 30 Minutes Intravenous  Once 08/09/2019 1337 07/24/2019 1520      Microbiology: Results for orders placed or performed during the hospital encounter of 07/29/2019  SARS Coronavirus 2 by RT PCR (hospital order, performed in [facility] hospital lab) Nasopharyngeal Nasopharyngeal Swab     Status: None   Collection Time: 08/16/2019 12:52 PM   Specimen: Nasopharyngeal Swab  Result Value Ref Range Status   SARS Coronavirus 2 NEGATIVE NEGATIVE Final    Comment: (NOTE) SARS-CoV-2 target nucleic acids are NOT DETECTED. The SARS-CoV-2 RNA is generally detectable in upper and lower respiratory specimens during the acute phase of infection. The lowest concentration of SARS-CoV-2 viral copies this assay can detect is 250 copies / mL. A negative result does not preclude SARS-CoV-2 infection and should not be used as the sole basis for treatment or other patient management decisions.  A negative result may occur with improper specimen collection / handling, submission of specimen other than nasopharyngeal swab, presence of viral mutation(s) within the areas targeted by this assay, and inadequate number of viral copies (<250 copies / mL). A negative result must be combined with clinical observations, patient history, and epidemiological information. Fact Sheet for Patients:   StrictlyIdeas.no Fact Sheet for Healthcare Providers: BankingDealers.co.za This test is not yet approved or cleared  by the [place] FDA and has been authorized for detection  and/or diagnosis of SARS-CoV-2 by FDA under an Emergency Use Authorization (EUA).  This EUA will  remain in effect (meaning this test can be used) for the duration of the COVID-19 declaration under Section 564(b)(1) of the Act, 21 U.S.C. section 360bbb-3(b)(1), unless the authorization is terminated or revoked sooner. Performed at [facility], [address] [place], [place] [address]   Urine culture     Status: Abnormal   Collection Time: 07/29/2019  1:38 PM   Specimen: Urine, Random  Result Value Ref Range Status   Specimen Description   Final    URINE, RANDOM Performed at [facility], [address] [place], [place] [address]    Special Requests   Final    NONE Performed at [facility], [place], [place] [address]    Culture 20,000 COLONIES/mL STAPHYLOCOCCUS AUREUS (A)  Final   Report Status 08/15/2019 FINAL  Final   Organism ID, Bacteria STAPHYLOCOCCUS AUREUS (A)  Final      Susceptibility   Staphylococcus aureus - MIC*    CIPROFLOXACIN <=0.5 SENSITIVE Sensitive     GENTAMICIN <=0.5 SENSITIVE Sensitive     NITROFURANTOIN <=16 SENSITIVE Sensitive     OXACILLIN 1 SENSITIVE Sensitive     TETRACYCLINE <=1 SENSITIVE Sensitive     VANCOMYCIN <=0.5 SENSITIVE Sensitive     TRIMETH/SULFA <=10 SENSITIVE Sensitive     CLINDAMYCIN <=0.25 SENSITIVE Sensitive     RIFAMPIN <=0.5 SENSITIVE Sensitive     Inducible Clindamycin NEGATIVE Sensitive     * 20,000 COLONIES/mL STAPHYLOCOCCUS AUREUS  MRSA PCR Screening     Status: None   Collection Time: 08/19/2019 11:05 PM   Specimen: Nasal Mucosa; Nasopharyngeal  Result Value Ref Range Status   MRSA by PCR NEGATIVE NEGATIVE Final    Comment:        The GeneXpert MRSA Assay (FDA approved for NASAL specimens only), is one component of a comprehensive MRSA colonization surveillance program. It is not intended to diagnose MRSA infection nor to guide or monitor treatment for MRSA infections. Performed at [facility], [address] [place],  [address]     Best  Practice/Protocols:  VTE Prophylaxis: Heparin (SQ) and Mechanical GI Prophylaxis: Proton Pump Inhibitor Continous Sedation Hyperglycemia (ICU) Heparin infusion discontinued today due to some coffee-ground emesis noted, infusion discontinued.  Patient will be placed on DVT prophylaxis only starting tomorrow.  SCDs  Events: 11/24 acute pulm edema and ETT intubation and MV support 11/25 failed SAT/SBT  Now with acute coronary syndrome c/w NSTEMI Findings reviewed with STEMI cardiologist on call-He reviewed EKG and  is c/w NSTEMI and cardiac ischemia needs anticoagulation 11/26 still requiring ventilator however able to tolerate SBT 11/27 SBT discontinued due to tachycardia, metoprolol adjusted, mild fever, pancultured.  Studies: Ct Head Wo Contrast  Result Date: 08/14/2019 CLINICAL DATA:  Ataxia, stroke suspected. Additional history provided: Patient admitted yesterday for acute respiratory failure. EXAM: CT HEAD WITHOUT CONTRAST TECHNIQUE: Contiguous axial images were obtained from the base of the skull through the vertex without intravenous contrast. COMPARISON:  CTA head 09/25/2018 FINDINGS: Brain: No evidence of acute intracranial hemorrhage. No demarcated cortical infarction. No evidence of intracranial mass. No midline shift or extra-axial fluid collection. Redemonstrated advanced chronic small vessel ischemic disease. This includes prominent chronic small vessel ischemic changes within the bilateral basal ganglia, thalami and within the pons. Mild generalized parenchymal atrophy. Vascular: No hyperdense vessel.  Atherosclerotic calcifications. Skull: Normal. Negative for fracture or focal lesion. Sinuses/Orbits: Visualized  orbits demonstrate no acute abnormality. Extensive partial opacification of the visualized right maxillary sinus. Secretions are also present within the pharynx. No significant mastoid effusion. IMPRESSION: 1. No CT evidence of acute intracranial abnormality. 2. Stable  generalized parenchymal atrophy and advanced chronic small vessel ischemic disease. 3. Extensive partial opacification of the partially imaged right maxillary sinus. Correlate for acute sinusitis. Electronically Signed   By: Kellie Simmering DO   On: 08/14/2019 21:00   US Venous Img Lower Unilateral Right (dvt)  Result Date: 08/06/2019 CLINICAL DATA:  Right leg swelling for 1 month. EXAM: RIGHT LOWER EXTREMITY VENOUS DOPPLER ULTRASOUND TECHNIQUE: Gray-scale sonography with graded compression, as well as color Doppler and duplex ultrasound were performed to evaluate the lower extremity deep venous systems from the level of the common femoral vein and including the common femoral, femoral, profunda femoral, popliteal and calf veins including the posterior tibial, peroneal and gastrocnemius veins when visible. The superficial great saphenous vein was also interrogated. Spectral Doppler was utilized to evaluate flow at rest and with distal augmentation maneuvers in the common femoral, femoral and popliteal veins. COMPARISON:  None. FINDINGS: Contralateral Common Femoral Vein: Respiratory phasicity is normal and symmetric with the symptomatic side. No evidence of thrombus. Normal compressibility. Common Femoral Vein: No evidence of thrombus. Normal compressibility, respiratory phasicity and response to augmentation. Saphenofemoral Junction: No evidence of thrombus. Normal compressibility and flow on color Doppler imaging. Profunda Femoral Vein: No evidence of thrombus. Normal compressibility and flow on color Doppler imaging. Femoral Vein: No evidence of thrombus. Normal compressibility, respiratory phasicity and response to augmentation. Popliteal Vein: No evidence of thrombus. Normal compressibility, respiratory phasicity and response to augmentation. Calf Veins: No evidence of thrombus. Normal compressibility and flow on color Doppler imaging. Superficial Great Saphenous Vein: No evidence of thrombus. Normal  compressibility. Venous Reflux:  None. Other Findings: Complex fluid collection in the right popliteal fossa measuring 8.1 2.9 x 2.5 cm, likely ossified intra-articular bodies. IMPRESSION: 1. No evidence of right lower extremity DVT. 2. Complex fluid collection in the right popliteal fossa likely Baker cyst containing probable intra-articular bodies. Electronically Signed   By: Keith Rake M.D.   On: 08/06/2019 04:34   Dg Chest Port 1 View  Result Date: 08/16/2019 CLINICAL DATA:  Acute respiratory failure EXAM: PORTABLE CHEST 1 VIEW COMPARISON:  Radiograph 08/14/2019 FINDINGS: Endotracheal tube in the mid trachea, approximately 5.2 cm from the carina. Transesophageal tube tip and side port distal to the GE junction. Continued improvement in the bilateral interstitial and airspace opacity with central venous congestion. Cardiomegaly stable from prior. Aorta is calcified. Small left effusion remains. IMPRESSION: Features likely reflect improving pulmonary edema when compared to prior. Trace residual left effusion. Aortic Atherosclerosis (ICD10-I70.0). Satisfactory positioning of lines and tubes. Electronically Signed   By: Lovena Le M.D.   On: 08/16/2019 02:46   Dg Chest Port 1 View  Result Date: 08/14/2019 CLINICAL DATA:  Status post intubation EXAM: PORTABLE CHEST 1 VIEW COMPARISON:  Film from the previous day. FINDINGS: Endotracheal tube and gastric catheter are noted in satisfactory position. Cardiac shadow is stable. Aortic calcifications are seen. The previously seen vascular congestion and pulmonary edema has improved slightly in the interval from the prior exam. No bony abnormality is seen. IMPRESSION: Slight improvement in the degree of pulmonary edema when compare with the prior exam. Electronically Signed   By: Inez Catalina M.D.   On: 08/14/2019 03:35   Dg Chest Portable 1 View  Result Date: 08/19/2019 CLINICAL DATA:  Post  intubation. EXAM: PORTABLE CHEST 1 VIEW COMPARISON:  Chest  x-ray from same day. FINDINGS: Interval intubation with endotracheal tube in good position 2.5 cm above the carina. Unchanged cardiomegaly with diffuse interstitial opacities. New mid to lower lung patchy airspace disease, worse on the right. No pneumothorax or large pleural effusion. No acute osseous abnormality. IMPRESSION: 1. Appropriately positioned endotracheal tube. 2. Worsening pulmonary edema. Electronically Signed   By: Titus Dubin M.D.   On: 08/09/2019 16:42   Dg Chest Port 1 View  Result Date: 08/02/2019 CLINICAL DATA:  Shortness of breath. Additional history provided: Generalized weakness and stomach upset. Dialysis patient with last treatment Saturday. EXAM: PORTABLE CHEST 1 VIEW COMPARISON:  Chest radiograph 08/05/2019 FINDINGS: Stable cardiomegaly.  Aortic atherosclerosis. As before, there are bilateral interstitial opacities with a mid to lower lung predominance. No evidence of pneumothorax or sizable pleural effusion. No acute bony abnormality. IMPRESSION: Stable cardiomegaly. Bilateral interstitial pulmonary opacities are similar to exam 08/05/2019. This may reflect interstitial edema or infection. Aortic atherosclerosis. Electronically Signed   By: Kellie Simmering DO   On: 07/27/2019 12:11   Dg Chest Portable 1 View  Result Date: 08/05/2019 CLINICAL DATA:  Left side chest pain EXAM: PORTABLE CHEST 1 VIEW COMPARISON:  07/06/2019 FINDINGS: Cardiomegaly with vascular congestion. Diffuse interstitial prominence likely reflects interstitial edema. No confluent opacities or effusions. Aortic atherosclerosis. No acute bony abnormality. IMPRESSION: Cardiomegaly, suspect mild interstitial edema. Electronically Signed   By: Rolm Baptise M.D.   On: 08/05/2019 21:56   Dg Abd Portable 1v  Result Date: 08/02/2019 CLINICAL DATA:  Orogastric tube placement EXAM: PORTABLE ABDOMEN - 1 VIEW COMPARISON:  06/26/2018 FINDINGS: NG tube in the body the stomach.  Normal bowel gas pattern. Bibasilar  airspace disease. IMPRESSION: NG tube in the body the stomach.  Normal bowel gas pattern. Electronically Signed   By: Franchot Gallo M.D.   On: 08/16/2019 17:39    Consults: [facility] cardiology  Subjective:    Overnight Issues: Some coffee-ground material from OG today.  Heparin had to be discontinued.  Continues to tolerate SBT however trials are shortened by tachycardia.  Adjusting beta-blockers.  Objective:  Vital signs for last 24 hours: Temp:  [98 F (36.7 C)-100.3 F (37.9 C)] 100.3 F (37.9 C) (11/27 1525) Pulse Rate:  [83-125] 100 (11/27 1700) Resp:  [15-25] 23 (11/27 1700) BP: (85-125)/(50-77) 106/63 (11/27 1700) SpO2:  [97 %-100 %] 100 % (11/27 1700) FiO2 (%):  [30 %-35 %] 30 % (11/27 1600) Weight:  [98.6 kg] 98.6 kg (11/27 0500)  Hemodynamic parameters for last 24 hours:    Intake/Output from previous day: 11/26 0701 - 11/27 0700 In: 1218.3 [I.V.:1218.3] Out: 2081 [Urine:25; Emesis/NG output:50]  Intake/Output this shift: Total I/O In: 130 [I.V.:100; Other:15; NG/GT:15] Out: 235 [Urine:35; Emesis/NG output:200]  Vent settings for last 24 hours: Vent Mode: PRVC FiO2 (%):  [30 %-35 %] 30 % Set Rate:  [23 bmp] 23 bmp Vt Set:  [480 mL] 480 mL PEEP:  [5 cmH20] 5 cmH20 Pressure Support:  [12 cmH20] 12 cmH20 Plateau Pressure:  [17 cmH20-19 cmH20] 17 cmH20  Physical Exam:  GENERAL: Sedated, mechanically ventilated, will open eyes, tracks and follows commands. HEAD: Normocephalic, atraumatic.  EYES: Pupils equal, round, reactive to light.  No scleral icterus.   MOUTH: Orotracheally intubated, OG in place. NECK: Supple.  Trachea midline, no JVD PULMONARY: Coarse breath sounds, no wheezes or rhonchi noted.  Good air entry bilaterally on vent.  Synchronous with the vent. CARDIOVASCULAR: S1 and S2.  Regular rate and rhythm. No murmurs, rubs, or gallops.  GASTROINTESTINAL: Soft, nondistended. No masses. Positive bowel sounds.   MUSCULOSKELETAL: No swelling, clubbing, or  edema.  NEUROLOGIC: Sedated, does open eyes to name and minimal stimulus.Tracks, follows commands. SKIN:intact,warm,dry  Assessment/Plan:   ACUTE Hypoxic Respiratory Failure due to acute pulmonary edema in the setting of acute NSTEMI  continue Full MV support  Management of NSTEMI, volume overload, management with HD  Wean Fio2 and PEEP as tolerated  Continue SBT as tolerated, hopefully extubate in a.m.  SUSPECT ACUTE SYSTOLIC CARDIAC FAILURE- ECHO pending ACUTE NSTEMI  Case discussed with Dr Clayborn Bigness on call for STEMI (per Dr. Mortimer Fries 11/25)  EKG shows ST changes I, AVL with recoprocal ST depressions  On HEPARIN INFUSION, discontinued today due to some coffee-ground material on OG  Follow up ECHO and Troponins  Previously evaluated by [facility] 11/17 see consult note, have been reconsulted  History of severe three-vessel CAD status post CABG and PCI, very difficult due to tortuous vessels  Echo at [facility] 06/21/2019 showed EF >55%.  Myocardial stress test at [facility] 07/22/2019 showed moderate intensity and large in size inferolateral defect that was partially reversible, apex hypokinetic.  And LVEF was documented as 45% at that time.  Echo performed 11/25, resulted late yesterday, LVEF 20 to 30%, clinically myocardial injury, patient has noted to have tortuous anatomy and prior CABG with very difficult PCI at [facility] in October  Adjusting beta-blockers   ESRD  on dialysis TTS  Neurology following, directing dialysis, appreciate input  Monitor electrolytes, management per renal and pharmacy  continue Foley Catheter-assess need  Avoid nephrotoxic agents  Dialysis for volume management as needed   NEUROLOGY  intubated and sedated  CT head negative  Daily wake up assessment  CARDIAC  ICU monitoring  NSTEMI on heparin infusion, heparin infusion discontinued due to coffee-ground material in OG   Will corroborate cardiology still following-consult sent  yesterday  2D echo reading as above  ID  Mild fever today, pancultured  Monitor for need of antibiotics   GI GI PROPHYLAXIS: Switched to Protonix twice daily  NUTRITIONAL STATUS  DIET-->TF's as tolerated  Constipation protocol as indicated  ENDO  will use ICU hypoglycemic\Hyperglycemia protocol if indicated   ELECTROLYTES  follow labs as needed  replace as needed, management per renal due to HD requirement  pharmacy consulted and following  HISTORY of OSA  This issue adds complexity to her management vis--vis extubation.  Will likely require nocturnal CPAP/BiPAP after extubation.    LOS: 3 days   Additional comments:None  Critical Care Total Time*: 40 Minutes   C. Derrill Kay, MD [place] [facility] 08/16/2019  *Care during the described time interval was provided by me and/or other providers on the critical care team.  I have reviewed this patient's available data, including medical history, events of note, physical examination and test results as part of my evaluation.  **This note was dictated using voice recognition software/Dragon.  Despite best efforts to proofread, errors can occur which can change the meaning.  Any change was purely unintentional.

## 2019-08-16 NOTE — Progress Notes (Addendum)
Shift summary:  - Coffee-ground emesis from OGT this AM. On heparin gtt for ACS. Dr. Patsey Berthold notified, heparin stopped.   - Updated patient's daughter, [name], at 0845hrs this AM.  - SBT this AM: ST in 120s; SPO2/BP remained stable.

## 2019-08-16 NOTE — Plan of Care (Signed)

## 2019-08-17 ENCOUNTER — Inpatient Hospital Stay: Payer: Medicare Other

## 2019-08-17 LAB — PHOSPHORUS
Phosphorus: 7.2 mg/dL — ABNORMAL HIGH (ref 2.5–4.6)
Phosphorus: 8.9 mg/dL — ABNORMAL HIGH (ref 2.5–4.6)

## 2019-08-17 LAB — COMPREHENSIVE METABOLIC PANEL
ALT: 60 U/L — ABNORMAL HIGH (ref 0–44)
AST: 108 U/L — ABNORMAL HIGH (ref 15–41)
Albumin: 3.2 g/dL — ABNORMAL LOW (ref 3.5–5.0)
Alkaline Phosphatase: 73 U/L (ref 38–126)
Anion gap: 20 — ABNORMAL HIGH (ref 5–15)
BUN: 56 mg/dL — ABNORMAL HIGH (ref 8–23)
CO2: 23 mmol/L (ref 22–32)
Calcium: 8.6 mg/dL — ABNORMAL LOW (ref 8.9–10.3)
Chloride: 93 mmol/L — ABNORMAL LOW (ref 98–111)
Creatinine, Ser: 6.26 mg/dL — ABNORMAL HIGH (ref 0.44–1.00)
GFR calc Af Amer: 7 mL/min — ABNORMAL LOW (ref >60)
GFR calc non Af Amer: 6 mL/min — ABNORMAL LOW (ref >60)
Glucose, Bld: 99 mg/dL (ref 70–99)
Potassium: 3.7 mmol/L (ref 3.5–5.1)
Sodium: 136 mmol/L (ref 135–145)
Total Bilirubin: 1 mg/dL (ref 0.3–1.2)
Total Protein: 7.4 g/dL (ref 6.5–8.1)

## 2019-08-17 LAB — GLUCOSE, CAPILLARY
Glucose-Capillary: 113 mg/dL — ABNORMAL HIGH (ref 70–99)
Glucose-Capillary: 77 mg/dL (ref 70–99)
Glucose-Capillary: 81 mg/dL (ref 70–99)
Glucose-Capillary: 92 mg/dL (ref 70–99)
Glucose-Capillary: 95 mg/dL (ref 70–99)
Glucose-Capillary: 97 mg/dL (ref 70–99)

## 2019-08-17 LAB — MAGNESIUM: Magnesium: 2 mg/dL (ref 1.7–2.4)

## 2019-08-17 MED ORDER — DEXMEDETOMIDINE HCL IN NACL 400 MCG/100ML IV SOLN
0.4000 ug/kg/h | INTRAVENOUS | Status: DC
  Administered 2019-08-17: 0.4 ug/kg/h via INTRAVENOUS
  Administered 2019-08-17: 0.5 ug/kg/h via INTRAVENOUS
  Administered 2019-08-18: 0.52 ug/kg/h via INTRAVENOUS
  Administered 2019-08-18: 0.4 ug/kg/h via INTRAVENOUS
  Filled 2019-08-17 (×6): qty 100

## 2019-08-17 MED ORDER — IPRATROPIUM-ALBUTEROL 0.5-2.5 (3) MG/3ML IN SOLN
3.0000 mL | Freq: Four times a day (QID) | RESPIRATORY_TRACT | Status: DC
  Administered 2019-08-17 – 2019-08-19 (×8): 3 mL via RESPIRATORY_TRACT
  Filled 2019-08-17 (×8): qty 3

## 2019-08-17 MED ORDER — VANCOMYCIN HCL IN DEXTROSE 1-5 GM/200ML-% IV SOLN: 1000.0000 mg | INTRAVENOUS | Status: DC

## 2019-08-17 MED ORDER — VANCOMYCIN HCL 10 G IV SOLR
2000.0000 mg | Freq: Once | INTRAVENOUS | Status: AC
  Administered 2019-08-17: 2000 mg via INTRAVENOUS
  Filled 2019-08-17: qty 2000

## 2019-08-17 MED ORDER — HEPARIN SODIUM (PORCINE) 5000 UNIT/ML IJ SOLN
5000.0000 [IU] | Freq: Two times a day (BID) | INTRAMUSCULAR | Status: DC
  Administered 2019-08-17 – 2019-08-21 (×9): 5000 [IU] via SUBCUTANEOUS
  Filled 2019-08-17 (×10): qty 1

## 2019-08-17 NOTE — Progress Notes (Signed)
Pre HD Note  I arrived tp Pt ICU 1 to initiate and deliver HD Tx as per PO. Pt is sleeping but responds to voice. Pt is intubated. BP WDL to start Tx. Access WDL.   08/17/19 1700  Hand-Off documentation  Report given to (Full Name) Newt Minion RN   Report received from (Full Name) Harriett Sine RN   Vital Signs  Temp 99 F (37.2 C)  Temp Source Oral  Pulse Rate 88  Resp 19  BP 101/63  BP Location Right Arm  BP Method Automatic  Patient Position (if appropriate) Lying  Oxygen Therapy  SpO2 97 %  O2 Device Ventilator  End [place] CO2 (EtCO2) 44  Pain Assessment  Pain Scale Faces  Pain Score 0  Time-Out for Hemodialysis  What Procedure? HD  Pt Identifiers(min of two) First/Last Name;MRN/Account#  Correct Site? Yes  Correct Side? Yes  Correct Procedure? Yes  Consents Verified? Yes  Safety Precautions Reviewed? Yes  Engineer, civil (consulting) Number [place] Number  (ICU1)  UF/Alarm Test Passed  Conductivity: Meter 13.6  Conductivity: Machine  14  pH 7.2  Reverse Osmosis  (1)  Normal Saline Lot Number [id number]  Dialyzer Lot Number [id number]  Disposable Set Lot Number [id number]  Machine Temperature 98.6 F (37 C)  Musician and Audible Yes  Blood Lines Intact and Secured Yes  Pre Treatment Patient Checks  Vascular access used during treatment Fistula  HD catheter dressing before treatment WDL  Patient is receiving dialysis in a chair  (In bed)  Hepatitis B Surface Antigen Results Negative  Date Hepatitis B Surface Antigen Drawn 08/14/19  Hepatitis B Surface Antibody  (<10)  Date Hepatitis B Surface Antibody Drawn 08/14/19  Hemodialysis Consent Verified Yes  Hemodialysis Standing Orders Initiated Yes  ECG (Telemetry) Monitor On Yes  Prime Ordered Normal Saline  Length of  DialysisTreatment -hour(s) 3.5 Hour(s)  Dialysis Treatment Comments  (Na140)  Dialyzer Elisio 17H NR  Dialysate 2K;2.5 Ca  Dialysis Anticoagulant None  Dialysate Flow Ordered 800  Blood  Flow Rate Ordered 400 mL/min  Ultrafiltration Goal 2 Liters  Fistula / Graft Left Upper arm Arteriovenous fistula  No Placement Date or Time found.   Placed prior to admission: Yes  Orientation: Left  Access Location: Upper arm  Access Type: Arteriovenous fistula  Site Condition No complications  Fistula / Graft Assessment Present;Thrill;Bruit  Status Accessed  Needle Size 15  Drainage Description None

## 2019-08-17 NOTE — Progress Notes (Signed)
Pre HD Tx Assessment    08/17/19 1605  Neurological  Level of Consciousness Alert  Orientation Level Intubated/Tracheostomy - Unable to assess  Respiratory  Respiratory Pattern Regular;Unlabored  Bilateral Breath Sounds Diminished;Clear  Cardiac  Pulse Regular  Heart Sounds S1, S2  Cardiac Rhythm NSR  Vascular  R Radial Pulse +2  L Radial Pulse +2  R Dorsalis Pedis Pulse +1  L Dorsalis Pedis Pulse +1  Edema Right lower extremity;Left lower extremity  Generalized Edema None  RLE Edema +1  LLE Edema +1  Integumentary  Integumentary (WDL) WDL  Musculoskeletal  Musculoskeletal (WDL) X  Generalized Weakness Yes  Gastrointestinal  Bowel Sounds Assessment Hypoactive  GU Assessment  Genitourinary (WDL) X  Genitourinary Symptoms Anuria (HD Pt)  Psychosocial  Psychosocial (WDL) X  Patient Behaviors Calm  Emotional support given Given to patient

## 2019-08-17 NOTE — Consult Note (Signed)
Pharmacy Antibiotic Note  Kimberly Shaffer is a 68 y.o. female admitted on 08/19/2019 with pneumonia.  Pharmacy has been consulted for Vancomycin dosing. Respiratory culture positive for Staphylococcus Aureus.  Currently on ventilator. Patient receives scheduled dialysis on Tuesday, Thursday, and Saturdays. Currently receiving dialysis intermittently while inpatient per Nephrologist, but plans to restart previous schedule this week.   Plan: Patient will receive Vancomycin 2g IV x 1 as loading dose(had dialysis session today. Will order Vancomycin 1g IV to be given in the last hour of each dialysis session on Tuesday, Thursday and Saturdays unless otherwise noted that the patient's schedule will change.  Height: 5\' 6"  (167.6 cm) Weight: 215 lb 6.2 oz (97.7 kg) IBW/kg (Calculated) : 59.3  Temp (24hrs), Avg:99 F (37.2 C), Min:98.4 F (36.9 C), Max:99.6 F (37.6 C)  Recent Labs  Lab 07/28/2019 1043 08/14/19 0002 08/15/19 0620 08/16/19 0518 08/17/19 0504  WBC 9.1 8.8 8.8 10.6*  --   CREATININE 9.52* 10.24* 6.32* 4.09* 6.26*    Estimated Creatinine Clearance: 10.1 mL/min (A) (by C-G formula based on SCr of 6.26 mg/dL (H)).    Allergies  Allergen Reactions  . Nystatin Hives and Itching  . Sulfa Antibiotics Swelling, Hives and Rash  . Niaspan [Niacin] Other (See Comments) and Hives    Antimicrobials this admission: Vancomycin 11/28 >>  Rocephin 11/24 x 1   Microbiology results: 11/28 BCx: NGTD 11/24 UCx: 20k Staph Aureus  11/27 Respiratory cx: Staph Aureus   Thank you for allowing pharmacy to be a part of this patient's care.  Lance Coon A Arin Peral 08/17/2019 7:59 PM

## 2019-08-17 NOTE — Progress Notes (Signed)
HD TX Completed   08/17/19 2045  Vital Signs  Resp 18  BP (!) 88/60  Oxygen Therapy  SpO2 95 %  O2 Device Ventilator  End Tidal CO2 (EtCO2) 39  Pain Assessment  Pain Scale Faces  Pain Score 0  During Hemodialysis Assessment  Blood Flow Rate (mL/min) 350 mL/min  Arterial Pressure (mmHg) -200 mmHg  Venous Pressure (mmHg) 270 mmHg  Transmembrane Pressure (mmHg) 50 mmHg  Ultrafiltration Rate (mL/min) 0 mL/min  Dialysate Flow Rate (mL/min) 800 ml/min  Conductivity: Machine  15.4  HD Safety Checks Performed Yes  Intra-Hemodialysis Comments Tx completed

## 2019-08-17 NOTE — Progress Notes (Signed)
HD Tx Started    08/17/19 1715  Vital Signs  Pulse Rate 91  Resp 18  BP 101/63  Oxygen Therapy  SpO2 94 %  O2 Device Ventilator  End Tidal CO2 (EtCO2) 46  Pain Assessment  Pain Scale Faces  Pain Score Asleep  During Hemodialysis Assessment  Blood Flow Rate (mL/min) 400 mL/min  Arterial Pressure (mmHg) -150 mmHg  Venous Pressure (mmHg) 220 mmHg  Transmembrane Pressure (mmHg) 60 mmHg  Ultrafiltration Rate (mL/min) 710 mL/min  Dialysate Flow Rate (mL/min) 800 ml/min  Conductivity: Machine  15.3  HD Safety Checks Performed Yes  Dialysis Fluid Bolus Normal Saline  Bolus Amount (mL) 250 mL  Intra-Hemodialysis Comments Tx initiated

## 2019-08-17 NOTE — Progress Notes (Signed)
Post HD Tx Assessment   08/17/19 2100  Neurological  Level of Consciousness New agitation confusion  Orientation Level Intubated/Tracheostomy - Unable to assess  Respiratory  Respiratory Pattern Regular;Unlabored  Chest Assessment Chest expansion symmetrical  Bilateral Breath Sounds Diminished  Cardiac  Pulse Regular  Heart Sounds S1, S2  Cardiac Rhythm NSR  Vascular  R Radial Pulse +2  L Radial Pulse +2  R Dorsalis Pedis Pulse +1  L Dorsalis Pedis Pulse +1  Edema Right lower extremity;Left lower extremity  Generalized Edema None  RLE Edema +1  LLE Edema +1  Integumentary  Integumentary (WDL) WDL  Musculoskeletal  Musculoskeletal (WDL) X  Generalized Weakness Yes  Gastrointestinal  Bowel Sounds Assessment Hypoactive  GU Assessment  Genitourinary (WDL) X  Genitourinary Symptoms Anuria (HD Pt)  Psychosocial  Psychosocial (WDL) X  Patient Behaviors Anxious;Agitated;Irritable  Emotional support given Given to patient

## 2019-08-17 NOTE — Plan of Care (Signed)

## 2019-08-17 NOTE — Progress Notes (Signed)
Post HD Tx.  Pt became agitated and tried to pull her intubated tubes, Primary RN made awrare. Pt received Precedex continuous drip and Versed IV push. BP dropped. UF turned off, BFR turned to 350. Prescribed UF goal of 254m could not be met. Actual UF is 20769m Access WDL post Tx. BP post risnseback was still low MAP 68.    08/17/19 2100  Hand-Off documentation  Report given to (Full Name) BeLaurin CoderN   Report received from (Full Name) DiNewt MinionN  Vital Signs  Temp 98.7 F (37.1 C)  Temp Source Axillary  Pulse Rate 94  Pulse Rate Source Monitor  Resp 20  BP (!) 90/58  BP Location Right Arm  BP Method Automatic  Oxygen Therapy  SpO2 95 %  O2 Device Ventilator  End Tidal CO2 (EtCO2) 39  Pain Assessment  Pain Scale Faces  Pain Score 0  Post-Hemodialysis Assessment  Rinseback Volume (mL) 250 mL  KECN 53.6 V  Dialyzer Clearance Lightly streaked  Duration of HD Treatment -hour(s) 3.15 hour(s)  Hemodialysis Intake (mL) 500 mL  UF Total -Machine (mL) 2070 mL  Net UF (mL) 1570 mL  Tolerated HD Treatment Yes  AVG/AVF Arterial Site Held (minutes) 8 minutes  AVG/AVF Venous Site Held (minutes) 7 minutes  Fistula / Graft Left Upper arm Arteriovenous fistula  No Placement Date or Time found.   Placed prior to admission: Yes  Orientation: Left  Access Location: Upper arm  Access Type: Arteriovenous fistula  Site Condition No complications  Fistula / Graft Assessment Present;Thrill;Bruit  Status Deaccessed  Drainage Description None

## 2019-08-17 NOTE — Progress Notes (Signed)
[place], [place] 08/17/19  Subjective:  Patient seen and evaluated at bedside. Remains critically ill. Still on the ventilator. Possible extubation today.  Objective:  Vital signs in last 24 hours:  Temp:  [98.4 F (36.9 C)-100.3 F (37.9 C)] 99 F (37.2 C) (11/28 1200) Pulse Rate:  [77-120] 82 (11/28 1300) Resp:  [14-25] 15 (11/28 1300) BP: (90-120)/(56-79) 99/65 (11/28 1300) SpO2:  [92 %-100 %] 100 % (11/28 1300) FiO2 (%):  [28 %-30 %] 28 % (11/28 1200) Weight:  [97.7 kg] 97.7 kg (11/28 0500)  Weight change: -1 kg Filed Weights   08/15/19 1145 08/16/19 0500 08/17/19 0500  Weight: 98.7 kg 98.6 kg 97.7 kg    Intake/Output:    Intake/Output Summary (Last 24 hours) at 08/17/2019 1417 Last data filed at 08/17/2019 1300 Gross per 24 hour  Intake 313.21 ml  Output 200 ml  Net 113.21 ml     Physical Exam: General:  Critically ill appearing  HEENT  anicteric, moist oral mucous membranes, ETT in place  Pulm/lungs  bilateral rales and rhonchi, vent assisted  CVS/Heart  S1S2 no rubs  Abdomen:   Soft, nontender, BS present  Extremities:  Trace pitting edema  Neurologic:  Arousable, will follow simple commands.  Skin:  No acute rashes  Access:  Left arm AV fistula       Basic Metabolic Panel:  Recent Labs  Lab 08/18/2019 1043 08/14/19 0002  08/15/19 0620 08/15/19 1445 08/16/19 0518 08/17/19 0504 08/17/19 1312  NA 135 138  --  137  --  137 136  --   K 4.4 5.0  --  3.8  --  3.1* 3.7  --   CL 98 102  --  96*  --  94* 93*  --   CO2 21* 18*  --  22  --  25 23  --   GLUCOSE 119* 115*  --  98  --  103* 99  --   BUN 49* 57*  --  32*  --  25* 56*  --   CREATININE 9.52* 10.24*  --  6.32*  --  4.09* 6.26*  --   CALCIUM 7.9* 7.8*  --  7.9*  --  8.5* 8.6*  --   MG  --  1.6*  --  1.9  --   --  2.0  --   PHOS  --  7.4*   < > 6.5* 2.6 5.1* 7.2* 8.9*   < > = values in this interval not displayed.     CBC: Recent Labs  Lab 08/11/2019 1043  08/14/19 0002 08/15/19 0620 08/16/19 0518  WBC 9.1 8.8 8.8 10.6*  NEUTROABS  --   --  6.9  --   HGB 8.5* 8.2* 7.6* 7.8*  HCT 27.1* 24.7* 24.3* 24.9*  MCV 102.7* 96.5 102.1* 100.4*  PLT 212 195 209 196      Lab Results  Component Value Date   HEPBSAG NON REACTIVE 08/14/2019   HEPBSAB Reactive 01/11/2018      Microbiology:  Recent Results (from the past 240 hour(s))  SARS Coronavirus 2 by RT PCR (hospital order, performed in [facility] hospital lab) Nasopharyngeal Nasopharyngeal Swab     Status: None   Collection Time: 08/03/2019 12:52 PM   Specimen: Nasopharyngeal Swab  Result Value Ref Range Status   SARS Coronavirus 2 NEGATIVE NEGATIVE Final    Comment: (NOTE) SARS-CoV-2 target nucleic acids are NOT DETECTED. The SARS-CoV-2 RNA is generally detectable in upper and lower respiratory  specimens during the acute phase of infection. The lowest concentration of SARS-CoV-2 viral copies this assay can detect is 250 copies / mL. A negative result does not preclude SARS-CoV-2 infection and should not be used as the sole basis for treatment or other patient management decisions.  A negative result may occur with improper specimen collection / handling, submission of specimen other than nasopharyngeal swab, presence of viral mutation(s) within the areas targeted by this assay, and inadequate number of viral copies (<250 copies / mL). A negative result must be combined with clinical observations, patient history, and epidemiological information. Fact Sheet for Patients:   StrictlyIdeas.no Fact Sheet for Healthcare Providers: BankingDealers.co.za This test is not yet approved or cleared  by the [place] FDA and has been authorized for detection and/or diagnosis of SARS-CoV-2 by FDA under an Emergency Use Authorization (EUA).  This EUA will remain in effect (meaning this test can be used) for the duration of the COVID-19  declaration under Section 564(b)(1) of the Act, 21 U.S.C. section 360bbb-3(b)(1), unless the authorization is terminated or revoked sooner. Performed at [facility], [address] [place], [place] [address]   Urine culture     Status: Abnormal   Collection Time: 08/12/2019  1:38 PM   Specimen: Urine, Random  Result Value Ref Range Status   Specimen Description   Final    URINE, RANDOM Performed at [facility], [address] [place], [place] [address]    Special Requests   Final    NONE Performed at [facility], [place], [place] [address]    Culture 20,000 COLONIES/mL STAPHYLOCOCCUS AUREUS (A)  Final   Report Status 08/15/2019 FINAL  Final   Organism ID, Bacteria STAPHYLOCOCCUS AUREUS (A)  Final      Susceptibility   Staphylococcus aureus - MIC*    CIPROFLOXACIN <=0.5 SENSITIVE Sensitive     GENTAMICIN <=0.5 SENSITIVE Sensitive     NITROFURANTOIN <=16 SENSITIVE Sensitive     OXACILLIN 1 SENSITIVE Sensitive     TETRACYCLINE <=1 SENSITIVE Sensitive     VANCOMYCIN <=0.5 SENSITIVE Sensitive     TRIMETH/SULFA <=10 SENSITIVE Sensitive     CLINDAMYCIN <=0.25 SENSITIVE Sensitive     RIFAMPIN <=0.5 SENSITIVE Sensitive     Inducible Clindamycin NEGATIVE Sensitive     * 20,000 COLONIES/mL STAPHYLOCOCCUS AUREUS  MRSA PCR Screening     Status: None   Collection Time: 08/08/2019 11:05 PM   Specimen: Nasal Mucosa; Nasopharyngeal  Result Value Ref Range Status   MRSA by PCR NEGATIVE NEGATIVE Final    Comment:        The GeneXpert MRSA Assay (FDA approved for NASAL specimens only), is one component of a comprehensive MRSA colonization surveillance program. It is not intended to diagnose MRSA infection nor to guide or monitor treatment for MRSA infections. Performed at [facility], [address] [place], [place] [address]   Culture, respiratory (non-expectorated)     Status: None (Preliminary result)   Collection Time:  08/16/19  3:36 PM   Specimen: Tracheal Aspirate; Respiratory  Result Value Ref Range Status   Specimen Description   Final    TRACHEAL ASPIRATE Performed at [facility], [address] [place], [place] [address]    Special Requests   Final    NONE Performed at [facility], [address] [place], [place] [address]    Gram Stain   Final    RARE WBC PRESENT, PREDOMINANTLY PMN MODERATE GRAM  POSITIVE COCCI RARE GRAM POSITIVE RODS    Culture   Final    MODERATE STAPHYLOCOCCUS AUREUS CULTURE REINCUBATED FOR BETTER GROWTH Performed at [facility], [place] [address] [place], [place] [address]    Report Status PENDING  Incomplete  CULTURE, BLOOD (ROUTINE X 2) w Reflex to ID Panel     Status: None (Preliminary result)   Collection Time: 08/16/19  4:01 PM   Specimen: BLOOD  Result Value Ref Range Status   Specimen Description BLOOD BLOOD RIGHT HAND  Final   Special Requests   Final    BOTTLES DRAWN AEROBIC AND ANAEROBIC Blood Culture adequate volume   Culture   Final    NO GROWTH < 24 HOURS Performed at [facility], [address] [place], [place] [address]    Report Status PENDING  Incomplete  CULTURE, BLOOD (ROUTINE X 2) w Reflex to ID Panel     Status: None (Preliminary result)   Collection Time: 08/16/19  4:09 PM   Specimen: BLOOD  Result Value Ref Range Status   Specimen Description BLOOD BLOOD RIGHT WRIST  Final   Special Requests   Final    BOTTLES DRAWN AEROBIC ONLY Blood Culture results may not be optimal due to an inadequate volume of blood received in culture bottles   Culture   Final    NO GROWTH < 24 HOURS Performed at [facility], [address], [place], [place] [address]    Report Status PENDING  Incomplete    Coagulation Studies: Recent Labs    08/14/19 Oct 28, 2200  LABPROT 13.9  INR 1.1    Urinalysis: No results for input(s): COLORURINE, LABSPEC, PHURINE, GLUCOSEU, HGBUR, BILIRUBINUR, KETONESUR, PROTEINUR,  UROBILINOGEN, NITRITE, LEUKOCYTESUR in the last 72 hours.  Invalid input(s): APPERANCEUR    Imaging: Dg Chest Port 1 View  Result Date: 08/17/2019 CLINICAL DATA:  Acute respiratory failure. EXAM: PORTABLE CHEST 1 VIEW COMPARISON:  08/16/2019 FINDINGS: Endotracheal tube terminates the carina approximately 4 cm above. Enteric tube courses into the abdomen with tip not imaged. The cardiac silhouette remains enlarged. Pulmonary vascular congestion and mild bilateral interstitial lung opacities are unchanged. There is improved aeration of the lung bases. There may be a trace residual left pleural effusion. No pneumothorax is identified. IMPRESSION: Persistent mild pulmonary edema with improved aeration of the lung bases. Electronically Signed   By: Logan Bores M.D.   On: 08/17/2019 07:41   Dg Chest Port 1 View  Result Date: 08/16/2019 CLINICAL DATA:  Acute respiratory failure EXAM: PORTABLE CHEST 1 VIEW COMPARISON:  Radiograph 08/14/2019 FINDINGS: Endotracheal tube in the mid trachea, approximately 5.2 cm from the carina. Transesophageal tube tip and side port distal to the GE junction. Continued improvement in the bilateral interstitial and airspace opacity with central venous congestion. Cardiomegaly stable from prior. Aorta is calcified. Small left effusion remains. IMPRESSION: Features likely reflect improving pulmonary edema when compared to prior. Trace residual left effusion. Aortic Atherosclerosis (ICD10-I70.0). Satisfactory positioning of lines and tubes. Electronically Signed   By: Lovena Le M.D.   On: 08/16/2019 02:46     Medications:   . sodium chloride 250 mL (08/15/19 Oct 28, 2142)  . dexmedetomidine (PRECEDEX) IV infusion Stopped (08/17/19 1022)  . fentaNYL infusion INTRAVENOUS Stopped (08/17/19 0940)  . propofol (DIPRIVAN) infusion Stopped (08/16/19 0826)   . chlorhexidine gluconate (MEDLINE KIT)  15 mL Mouth Rinse BID  . Chlorhexidine Gluconate Cloth  6 each Topical Q0600  .  epoetin (EPOGEN/PROCRIT) injection  10,000 Units Intravenous Q  T,Th,Sa-HD  . ipratropium-albuterol  3 mL Nebulization Q6H  . mouth rinse  15 mL Mouth Rinse 10 times per day  . metoprolol tartrate  25 mg Per Tube BID  . pantoprazole (PROTONIX) IV  40 mg Intravenous BID  . sodium chloride flush  3 mL Intravenous Q12H   sodium chloride, acetaminophen, fentaNYL, fentaNYL (SUBLIMAZE) injection, fentaNYL (SUBLIMAZE) injection, midazolam, midazolam, ondansetron (ZOFRAN) IV, sodium chloride flush  Assessment/ Plan:  68 y.o. female with stage renal disease on hemodialysis, coronary artery disease with history of CABG 2012 and PCI, peripheral arterial disease, history of right femoral-popliteal revascularization, COPD, hypertension, hyperlipidemia, diabetes type 2, history of stroke, history of anxiety and depression, obstructive sleep apnea admitted for evaluation of chest pain  Active Problems:   Respiratory failure ([place])   #. ESRD  [facility] Dialysis// TuThSa-1//TW//98kg//[facility] -Patient due for hemodialysis today.  Orders have been prepared.  #. Anemia of CKD  Lab Results  Component Value Date   HGB 7.8 (L) 08/16/2019  Hemoglobin still low at 7.8.  Maintain the patient on Epogen 10,000 units IV with dialysis.  #. SHPTH     Component Value Date/Time   PTH 472 (H) 07/06/2019 1455   Lab Results  Component Value Date   PHOS 8.9 (H) 08/17/2019  Phosphorus of 3.9 today.  Patient currently not receiving binders as she is n.p.o.  Repeat serum phosphorus prior to next dialysis treatment.  Should come down with dialysis treatment today.   #Acute respiratory failure. Patient is arousable today.  Hopefully she can be extubated today.    LOS: 4 Dakota Stangl 11/28/20202:17 PM  [employer], [place]

## 2019-08-17 NOTE — Plan of Care (Signed)

## 2019-08-17 NOTE — Progress Notes (Signed)
Shift summary:  - Remained on PSV for most of morning; awaiting extubation orders.  - Planning for H/D today.

## 2019-08-17 NOTE — Progress Notes (Signed)
Follow up - Critical Care Medicine Note  Patient Details:    Kimberly Shaffer is an 68 y.o. femalewith past medical history of CAD status post CABG, hypertension, hyperlipidemia, diabetes, COPDon 2L [place], and ESRD on HD who presents to the ED complaining of shortness of breath and generalized weakness -patient with severe hypoxia progressive resp failure placed on BiPAP She has ESRD on HD last HD was Saturday Failed BiPAP and now intubated and placed on MV support  Lines, Airways, Drains: Airway 7.5 mm (Active)  Secured at (cm) 23 cm 08/15/19 0824  Measured From Lips 08/15/19 0824  Secured Location Right 08/15/19 0824  Secured By [employer] 08/15/19 0824  Tube Holder Repositioned Yes 08/15/19 0824  Cuff Pressure (cm H2O) 26 cm H2O 08/15/19 0824  Site Condition Cool;Dry 08/15/19 0824     NG/OG Tube Orogastric 18 Fr. Center mouth Xray (Active)  Cm Marking at Nare/Corner of Mouth (if applicable) 56 cm [id number]  Site Assessment Clean;Dry;Intact 08/15/19 0800  Ongoing Placement Verification No change in cm markings or external length of tube from initial placement;No change in respiratory status;No acute changes, not attributed to clinical condition 08/15/19 0800  Status Suction-low intermittent 08/15/19 0800  Drainage Appearance Bile 08/15/19 0400  Output (mL) 150 mL 08/14/19 2000     Urethral Catheter Sherie RN Latex 16 Fr. (Active)  Indication for Insertion or Continuance of Catheter Therapy based on hourly urine output monitoring and documentation for critical condition (NOT STRICT I&O) 08/15/19 0800  Site Assessment Clean;Intact;Dry 08/15/19 0800  Catheter Maintenance Bag below level of bladder;Catheter secured;Drainage bag/tubing not touching floor;Insertion date on drainage bag;No dependent loops;Seal intact 08/15/19 0800  Collection Container Standard drainage bag 08/15/19 0800  Securement Method Securing device (Describe) 08/15/19 0800  Urinary Catheter  Interventions (if applicable) Unclamped [id number]  Output (mL) 15 mL 08/15/19 0400    Anti-infectives:  Anti-infectives (From admission, onward)   Start     Dose/Rate Route Frequency Ordered Stop   07/21/2019 1345  cefTRIAXone (ROCEPHIN) 1 g in sodium chloride 0.9 % 100 mL IVPB     1 g 200 mL/hr over 30 Minutes Intravenous  Once 07/22/2019 1337 08/03/2019 1520      Microbiology: Results for orders placed or performed during the hospital encounter of 08/10/2019  SARS Coronavirus 2 by RT PCR (hospital order, performed in [facility] hospital lab) Nasopharyngeal Nasopharyngeal Swab     Status: None   Collection Time: 08/06/2019 12:52 PM   Specimen: Nasopharyngeal Swab  Result Value Ref Range Status   SARS Coronavirus 2 NEGATIVE NEGATIVE Final    Comment: (NOTE) SARS-CoV-2 target nucleic acids are NOT DETECTED. The SARS-CoV-2 RNA is generally detectable in upper and lower respiratory specimens during the acute phase of infection. The lowest concentration of SARS-CoV-2 viral copies this assay can detect is 250 copies / mL. A negative result does not preclude SARS-CoV-2 infection and should not be used as the sole basis for treatment or other patient management decisions.  A negative result may occur with improper specimen collection / handling, submission of specimen other than nasopharyngeal swab, presence of viral mutation(s) within the areas targeted by this assay, and inadequate number of viral copies (<250 copies / mL). A negative result must be combined with clinical observations, patient history, and epidemiological information. Fact Sheet for Patients:   StrictlyIdeas.no Fact Sheet for Healthcare Providers: BankingDealers.co.za This test is not yet approved or cleared  by the [place] FDA and has been authorized for detection  and/or diagnosis of SARS-CoV-2 by FDA under an Emergency Use Authorization (EUA).  This EUA will  remain in effect (meaning this test can be used) for the duration of the COVID-19 declaration under Section 564(b)(1) of the Act, 21 U.S.C. section 360bbb-3(b)(1), unless the authorization is terminated or revoked sooner. Performed at [facility], [address] [place], [place] [address]   Urine culture     Status: Abnormal   Collection Time: 07/29/2019  1:38 PM   Specimen: Urine, Random  Result Value Ref Range Status   Specimen Description   Final    URINE, RANDOM Performed at [facility], [address] [place], [place] [address]    Special Requests   Final    NONE Performed at [facility], [place], [place] [address]    Culture 20,000 COLONIES/mL STAPHYLOCOCCUS AUREUS (A)  Final   Report Status 08/15/2019 FINAL  Final   Organism ID, Bacteria STAPHYLOCOCCUS AUREUS (A)  Final      Susceptibility   Staphylococcus aureus - MIC*    CIPROFLOXACIN <=0.5 SENSITIVE Sensitive     GENTAMICIN <=0.5 SENSITIVE Sensitive     NITROFURANTOIN <=16 SENSITIVE Sensitive     OXACILLIN 1 SENSITIVE Sensitive     TETRACYCLINE <=1 SENSITIVE Sensitive     VANCOMYCIN <=0.5 SENSITIVE Sensitive     TRIMETH/SULFA <=10 SENSITIVE Sensitive     CLINDAMYCIN <=0.25 SENSITIVE Sensitive     RIFAMPIN <=0.5 SENSITIVE Sensitive     Inducible Clindamycin NEGATIVE Sensitive     * 20,000 COLONIES/mL STAPHYLOCOCCUS AUREUS  MRSA PCR Screening     Status: None   Collection Time: 08/09/2019 11:05 PM   Specimen: Nasal Mucosa; Nasopharyngeal  Result Value Ref Range Status   MRSA by PCR NEGATIVE NEGATIVE Final    Comment:        The GeneXpert MRSA Assay (FDA approved for NASAL specimens only), is one component of a comprehensive MRSA colonization surveillance program. It is not intended to diagnose MRSA infection nor to guide or monitor treatment for MRSA infections. Performed at [facility], [address] [place], [place] [address]   Culture,  respiratory (non-expectorated)     Status: None (Preliminary result)   Collection Time: 08/16/19  3:36 PM   Specimen: Tracheal Aspirate; Respiratory  Result Value Ref Range Status   Specimen Description   Final    TRACHEAL ASPIRATE Performed at [facility], [address], [place], [place] [address]    Special Requests   Final    NONE Performed at [facility], [address] [place], [place] [address]    Gram Stain   Final    RARE WBC PRESENT, PREDOMINANTLY PMN MODERATE GRAM POSITIVE COCCI RARE GRAM POSITIVE RODS    Culture   Final    MODERATE STAPHYLOCOCCUS AUREUS CULTURE REINCUBATED FOR BETTER GROWTH Performed at [facility], [address], [place], [place] [address]    Report Status PENDING  Incomplete  CULTURE, BLOOD (ROUTINE X 2) w Reflex to ID Panel     Status: None (Preliminary result)   Collection Time: 08/16/19  4:01 PM   Specimen: BLOOD  Result Value Ref Range Status   Specimen Description BLOOD BLOOD RIGHT HAND  Final   Special Requests   Final    BOTTLES DRAWN AEROBIC AND ANAEROBIC Blood Culture adequate volume   Culture   Final    NO GROWTH < 24 HOURS Performed at [facility], [address],  [place], [place] [address]    Report Status PENDING  Incomplete  CULTURE, BLOOD (ROUTINE X 2) w Reflex to ID Panel     Status: None (Preliminary result)   Collection Time: 08/16/19  4:09 PM   Specimen: BLOOD  Result Value Ref Range Status   Specimen Description BLOOD BLOOD RIGHT WRIST  Final   Special Requests   Final    BOTTLES DRAWN AEROBIC ONLY Blood Culture results may not be optimal due to an inadequate volume of blood received in culture bottles   Culture   Final    NO GROWTH < 24 HOURS Performed at [facility], [address] [place], [place] [address]    Report Status PENDING  Incomplete    Best Practice/Protocols:  VTE Prophylaxis: Heparin (SQ) and Mechanical GI Prophylaxis: Proton Pump  Inhibitor Continous Sedation Hyperglycemia (ICU) Heparin infusion discontinued today due to some coffee-ground emesis noted, infusion discontinued.  Patient will be placed on DVT prophylaxis only starting tomorrow.  SCDs  Events: 11/24 acute pulm edema and ETT intubation and MV support 11/25 failed SAT/SBT  Now with acute coronary syndrome c/w NSTEMI Findings reviewed with STEMI cardiologist on call-He reviewed EKG and  is c/w NSTEMI and cardiac ischemia needs anticoagulation 11/26 still requiring ventilator however able to tolerate SBT 11/27 SBT discontinued due to tachycardia, metoprolol adjusted, mild fever, pancultured. 11/28 start Precedex so we can decrease use of fentanyl and proceed with weaning.  Patient to get dialysis today.  Studies: Ct Head Wo Contrast  Result Date: 08/14/2019 CLINICAL DATA:  Ataxia, stroke suspected. Additional history provided: Patient admitted yesterday for acute respiratory failure. EXAM: CT HEAD WITHOUT CONTRAST TECHNIQUE: Contiguous axial images were obtained from the base of the skull through the vertex without intravenous contrast. COMPARISON:  CTA head 09/25/2018 FINDINGS: Brain: No evidence of acute intracranial hemorrhage. No demarcated cortical infarction. No evidence of intracranial mass. No midline shift or extra-axial fluid collection. Redemonstrated advanced chronic small vessel ischemic disease. This includes prominent chronic small vessel ischemic changes within the bilateral basal ganglia, thalami and within the pons. Mild generalized parenchymal atrophy. Vascular: No hyperdense vessel.  Atherosclerotic calcifications. Skull: Normal. Negative for fracture or focal lesion. Sinuses/Orbits: Visualized orbits demonstrate no acute abnormality. Extensive partial opacification of the visualized right maxillary sinus. Secretions are also present within the pharynx. No significant mastoid effusion. IMPRESSION: 1. No CT evidence of acute intracranial  abnormality. 2. Stable generalized parenchymal atrophy and advanced chronic small vessel ischemic disease. 3. Extensive partial opacification of the partially imaged right maxillary sinus. Correlate for acute sinusitis. Electronically Signed   By: Kellie Simmering DO   On: 08/14/2019 21:00   US Venous Img Lower Unilateral Right (dvt)  Result Date: 08/06/2019 CLINICAL DATA:  Right leg swelling for 1 month. EXAM: RIGHT LOWER EXTREMITY VENOUS DOPPLER ULTRASOUND TECHNIQUE: Gray-scale sonography with graded compression, as well as color Doppler and duplex ultrasound were performed to evaluate the lower extremity deep venous systems from the level of the common femoral vein and including the common femoral, femoral, profunda femoral, popliteal and calf veins including the posterior tibial, peroneal and gastrocnemius veins when visible. The superficial great saphenous vein was also interrogated. Spectral Doppler was utilized to evaluate flow at rest and with distal augmentation maneuvers in the common femoral, femoral and popliteal veins. COMPARISON:  None. FINDINGS: Contralateral Common Femoral Vein: Respiratory phasicity is normal and symmetric with the symptomatic side. No evidence of thrombus. Normal compressibility. Common Femoral Vein: No evidence of thrombus. Normal compressibility, respiratory  phasicity and response to augmentation. Saphenofemoral Junction: No evidence of thrombus. Normal compressibility and flow on color Doppler imaging. Profunda Femoral Vein: No evidence of thrombus. Normal compressibility and flow on color Doppler imaging. Femoral Vein: No evidence of thrombus. Normal compressibility, respiratory phasicity and response to augmentation. Popliteal Vein: No evidence of thrombus. Normal compressibility, respiratory phasicity and response to augmentation. Calf Veins: No evidence of thrombus. Normal compressibility and flow on color Doppler imaging. Superficial Great Saphenous Vein: No evidence of  thrombus. Normal compressibility. Venous Reflux:  None. Other Findings: Complex fluid collection in the right popliteal fossa measuring 8.1 2.9 x 2.5 cm, likely ossified intra-articular bodies. IMPRESSION: 1. No evidence of right lower extremity DVT. 2. Complex fluid collection in the right popliteal fossa likely Baker cyst containing probable intra-articular bodies. Electronically Signed   By: Keith Rake M.D.   On: 08/06/2019 04:34   Dg Chest Port 1 View  Result Date: 08/17/2019 CLINICAL DATA:  Acute respiratory failure. EXAM: PORTABLE CHEST 1 VIEW COMPARISON:  08/16/2019 FINDINGS: Endotracheal tube terminates the carina approximately 4 cm above. Enteric tube courses into the abdomen with tip not imaged. The cardiac silhouette remains enlarged. Pulmonary vascular congestion and mild bilateral interstitial lung opacities are unchanged. There is improved aeration of the lung bases. There may be a trace residual left pleural effusion. No pneumothorax is identified. IMPRESSION: Persistent mild pulmonary edema with improved aeration of the lung bases. Electronically Signed   By: Logan Bores M.D.   On: 08/17/2019 07:41   Dg Chest Port 1 View  Result Date: 08/16/2019 CLINICAL DATA:  Acute respiratory failure EXAM: PORTABLE CHEST 1 VIEW COMPARISON:  Radiograph 08/14/2019 FINDINGS: Endotracheal tube in the mid trachea, approximately 5.2 cm from the carina. Transesophageal tube tip and side port distal to the GE junction. Continued improvement in the bilateral interstitial and airspace opacity with central venous congestion. Cardiomegaly stable from prior. Aorta is calcified. Small left effusion remains. IMPRESSION: Features likely reflect improving pulmonary edema when compared to prior. Trace residual left effusion. Aortic Atherosclerosis (ICD10-I70.0). Satisfactory positioning of lines and tubes. Electronically Signed   By: Lovena Le M.D.   On: 08/16/2019 02:46   Dg Chest Port 1 View  Result Date:  08/14/2019 CLINICAL DATA:  Status post intubation EXAM: PORTABLE CHEST 1 VIEW COMPARISON:  Film from the previous day. FINDINGS: Endotracheal tube and gastric catheter are noted in satisfactory position. Cardiac shadow is stable. Aortic calcifications are seen. The previously seen vascular congestion and pulmonary edema has improved slightly in the interval from the prior exam. No bony abnormality is seen. IMPRESSION: Slight improvement in the degree of pulmonary edema when compare with the prior exam. Electronically Signed   By: Inez Catalina M.D.   On: 08/14/2019 03:35   Dg Chest Portable 1 View  Result Date: 08/01/2019 CLINICAL DATA:  Post intubation. EXAM: PORTABLE CHEST 1 VIEW COMPARISON:  Chest x-ray from same day. FINDINGS: Interval intubation with endotracheal tube in good position 2.5 cm above the carina. Unchanged cardiomegaly with diffuse interstitial opacities. New mid to lower lung patchy airspace disease, worse on the right. No pneumothorax or large pleural effusion. No acute osseous abnormality. IMPRESSION: 1. Appropriately positioned endotracheal tube. 2. Worsening pulmonary edema. Electronically Signed   By: Titus Dubin M.D.   On: 08/12/2019 16:42   Dg Chest Port 1 View  Result Date: 08/17/2019 CLINICAL DATA:  Shortness of breath. Additional history provided: Generalized weakness and stomach upset. Dialysis patient with last treatment Saturday. EXAM: PORTABLE CHEST 1  VIEW COMPARISON:  Chest radiograph 08/05/2019 FINDINGS: Stable cardiomegaly.  Aortic atherosclerosis. As before, there are bilateral interstitial opacities with a mid to lower lung predominance. No evidence of pneumothorax or sizable pleural effusion. No acute bony abnormality. IMPRESSION: Stable cardiomegaly. Bilateral interstitial pulmonary opacities are similar to exam 08/05/2019. This may reflect interstitial edema or infection. Aortic atherosclerosis. Electronically Signed   By: Kellie Simmering DO   On: 07/25/2019 12:11    Dg Chest Portable 1 View  Result Date: 08/05/2019 CLINICAL DATA:  Left side chest pain EXAM: PORTABLE CHEST 1 VIEW COMPARISON:  07/06/2019 FINDINGS: Cardiomegaly with vascular congestion. Diffuse interstitial prominence likely reflects interstitial edema. No confluent opacities or effusions. Aortic atherosclerosis. No acute bony abnormality. IMPRESSION: Cardiomegaly, suspect mild interstitial edema. Electronically Signed   By: Rolm Baptise M.D.   On: 08/05/2019 21:56   Dg Abd Portable 1v  Result Date: 08/08/2019 CLINICAL DATA:  Orogastric tube placement EXAM: PORTABLE ABDOMEN - 1 VIEW COMPARISON:  06/26/2018 FINDINGS: NG tube in the body the stomach.  Normal bowel gas pattern. Bibasilar airspace disease. IMPRESSION: NG tube in the body the stomach.  Normal bowel gas pattern. Electronically Signed   By: Franchot Gallo M.D.   On: 07/30/2019 17:39    Consults: [facility] cardiology  Subjective:    Overnight Issues: Did have some agitation overnight and fentanyl was increased and received Versed as needed.  Initiated Precedex to decrease use of opiates and benzos.  Toprol adjusted yesterday tachycardia improved today.  Objective:  Vital signs for last 24 hours: Temp:  [98.4 F (36.9 C)-100.3 F (37.9 C)] 99 F (37.2 C) (11/28 1200) Pulse Rate:  [77-120] 81 (11/28 1100) Resp:  [14-25] 14 (11/28 1100) BP: (90-120)/(56-79) 98/63 (11/28 1100) SpO2:  [92 %-100 %] 100 % (11/28 1100) FiO2 (%):  [28 %-30 %] 28 % (11/28 1025) Weight:  [97.7 kg] 97.7 kg (11/28 0500)  Hemodynamic parameters for last 24 hours:    Intake/Output from previous day: 11/27 0701 - 11/28 0700 In: 334.1 [I.V.:304.1; NG/GT:15] Out: 285 [Urine:35; Emesis/NG output:250]  Intake/Output this shift: Total I/O In: 86.6 [I.V.:51.6; Other:15; NG/GT:20] Out: 150 [Emesis/NG output:150]  Vent settings for last 24 hours: Vent Mode: PSV FiO2 (%):  [28 %-30 %] 28 % Set Rate:  [23 bmp] 23 bmp Vt Set:  [480 mL-530 mL] 530  mL PEEP:  [5 cmH20] 5 cmH20 Pressure Support:  [5 cmH20] 5 cmH20 Plateau Pressure:  [17 [id number] cmH20] 17 cmH20  Physical Exam:  GENERAL: Sedated, mechanically ventilated, will open eyes, tracks and follows commands. HEAD: Normocephalic, atraumatic.  EYES: Pupils equal, round, reactive to light.  No scleral icterus.   MOUTH: Orotracheally intubated, OG in place. NECK: Supple.  Trachea midline, no JVD PULMONARY: Coarse breath sounds, no wheezes or rhonchi noted.  Good air entry bilaterally on vent.  Synchronous with the vent. CARDIOVASCULAR: S1 and S2. Regular rate and rhythm. No murmurs, rubs, or gallops.  GASTROINTESTINAL: Soft, nondistended. No masses. Positive bowel sounds.   MUSCULOSKELETAL: No swelling, clubbing, or edema.  NEUROLOGIC: Sedated, does open eyes to name and minimal stimulus.Tracks, follows commands. SKIN:intact,warm,dry  Assessment/Plan:   ACUTE Hypoxic Respiratory Failure due to acute pulmonary edema in the setting of acute NSTEMI  continue Full MV support  Management of NSTEMI, volume overload, management with HD  Wean Fio2 and PEEP as tolerated  Continue SBT as tolerated, hopefully extubate in a.m.  SUSPECT ACUTE SYSTOLIC CARDIAC FAILURE- ECHO pending ACUTE NSTEMI  Case discussed with Dr Clayborn Bigness on call for  STEMI (per Dr. Mortimer Fries 11/25)  EKG shows ST changes I, AVL with reciprocal ST depressions  On HEPARIN INFUSION, discontinued today due to some coffee-ground material on OG  Follow up ECHO and Troponins  Previously evaluated by [facility] 11/17 see consult note, have been reconsulted  History of severe three-vessel CAD status post CABG and PCI, very difficult due to tortuous vessels  Echo at [facility] 06/21/2019 showed EF >55%.  Myocardial stress test at [facility] 07/22/2019 showed moderate intensity and large in size inferolateral defect that was partially reversible, apex hypokinetic.  And LVEF was documented as 45% at that time.  Echo performed  11/25, resulted late yesterday, LVEF 20 to 30%, clinically myocardial injury, patient has noted to have tortuous anatomy and prior CABG with very difficult PCI at [facility] in October  Adjusted beta-blockers yesterday, she has better heart rate control today  ESRD  on dialysis TTS  Nephrology following, directing dialysis, appreciate input  Monitor electrolytes, management per renal and pharmacy  Foley catheter discontinued yesterday   avoid nephrotoxic agents  Dialysis for volume management as needed   NEUROLOGY  intubated and lightly sedated, episode of agitation last night  CT head negative  Daily wake up assessment  CARDIAC  ICU monitoring  NSTEMI on heparin infusion, heparin infusion discontinued 11/27 due to coffee-ground material in OG   Will corroborate cardiology still following-consult sent 11/26   2D echo reading as above  ID  Mild fever yesterday, pancultured  Preliminary staph aureus in the sputum  Start vancomycin per pharmacy  GI GI PROPHYLAXIS: Switched to Protonix twice daily  NUTRITIONAL STATUS  DIET-->TF's as tolerated  Constipation protocol as indicated  ENDO  will use ICU hypoglycemic\Hyperglycemia protocol if indicated   ELECTROLYTES  follow labs as needed  replace as needed, management per renal due to HD requirement  pharmacy consulted and following  HISTORY of OSA  This issue adds complexity to her management vis--vis extubation.  Will likely require nocturnal CPAP/BiPAP after extubation.    LOS: 4 days   Additional comments:None  Critical Care Total Time*: 35 Minutes   C. Derrill Kay, MD [facility] 08/17/2019  *Care during the described time interval was provided by me and/or other providers on the critical care team.  I have reviewed this patient's available data, including medical history, events of note, physical examination and test results as part of my evaluation.  **This note was dictated  using voice recognition software/Dragon.  Despite best efforts to proofread, errors can occur which can change the meaning.  Any change was purely unintentional.

## 2019-08-18 ENCOUNTER — Inpatient Hospital Stay: Payer: Medicare Other

## 2019-08-18 DIAGNOSIS — Z992 Dependence on renal dialysis: Secondary | ICD-10-CM

## 2019-08-18 DIAGNOSIS — I255 Ischemic cardiomyopathy: Secondary | ICD-10-CM

## 2019-08-18 LAB — CBC WITH DIFFERENTIAL/PLATELET
Abs Immature Granulocytes: 0.39 10*3/uL — ABNORMAL HIGH (ref 0.00–0.07)
Basophils Absolute: 0 10*3/uL (ref 0.0–0.1)
Basophils Relative: 0 %
Eosinophils Absolute: 0.1 10*3/uL (ref 0.0–0.5)
Eosinophils Relative: 1 %
HCT: 27.7 % — ABNORMAL LOW (ref 36.0–46.0)
Hemoglobin: 8.6 g/dL — ABNORMAL LOW (ref 12.0–15.0)
Immature Granulocytes: 3 %
Lymphocytes Relative: 6 %
Lymphs Abs: 1 10*3/uL (ref 0.7–4.0)
MCH: 32 pg (ref 26.0–34.0)
MCHC: 31 g/dL (ref 30.0–36.0)
MCV: 103 fL — ABNORMAL HIGH (ref 80.0–100.0)
Monocytes Absolute: 1.7 10*3/uL — ABNORMAL HIGH (ref 0.1–1.0)
Monocytes Relative: 11 %
Neutro Abs: 12.4 10*3/uL — ABNORMAL HIGH (ref 1.7–7.7)
Neutrophils Relative %: 79 %
Platelets: 180 10*3/uL (ref 150–400)
RBC: 2.69 MIL/uL — ABNORMAL LOW (ref 3.87–5.11)
RDW: 14.5 % (ref 11.5–15.5)
WBC: 15.6 10*3/uL — ABNORMAL HIGH (ref 4.0–10.5)
nRBC: 0.9 % — ABNORMAL HIGH (ref 0.0–0.2)

## 2019-08-18 LAB — RENAL FUNCTION PANEL
Albumin: 3.3 g/dL — ABNORMAL LOW (ref 3.5–5.0)
Anion gap: 18 — ABNORMAL HIGH (ref 5–15)
BUN: 35 mg/dL — ABNORMAL HIGH (ref 8–23)
CO2: 23 mmol/L (ref 22–32)
Calcium: 8.5 mg/dL — ABNORMAL LOW (ref 8.9–10.3)
Chloride: 97 mmol/L — ABNORMAL LOW (ref 98–111)
Creatinine, Ser: 4.09 mg/dL — ABNORMAL HIGH (ref 0.44–1.00)
GFR calc Af Amer: 12 mL/min — ABNORMAL LOW (ref >60)
GFR calc non Af Amer: 11 mL/min — ABNORMAL LOW (ref >60)
Glucose, Bld: 102 mg/dL — ABNORMAL HIGH (ref 70–99)
Phosphorus: 5.4 mg/dL — ABNORMAL HIGH (ref 2.5–4.6)
Potassium: 4 mmol/L (ref 3.5–5.1)
Sodium: 138 mmol/L (ref 135–145)

## 2019-08-18 LAB — GLUCOSE, CAPILLARY
Glucose-Capillary: 93 mg/dL (ref 70–99)
Glucose-Capillary: 96 mg/dL (ref 70–99)
Glucose-Capillary: 96 mg/dL (ref 70–99)
Glucose-Capillary: 115 mg/dL — ABNORMAL HIGH (ref 70–99)
Glucose-Capillary: 127 mg/dL — ABNORMAL HIGH (ref 70–99)
Glucose-Capillary: 136 mg/dL — ABNORMAL HIGH (ref 70–99)

## 2019-08-18 MED ORDER — VITAL HIGH PROTEIN PO LIQD
1000.0000 mL | ORAL | Status: DC
  Administered 2019-08-18 – 2019-08-21 (×4): 1000 mL

## 2019-08-18 MED ORDER — PRO-STAT SUGAR FREE PO LIQD
30.0000 mL | Freq: Three times a day (TID) | ORAL | Status: DC
  Administered 2019-08-18 – 2019-08-21 (×10): 30 mL

## 2019-08-18 NOTE — Progress Notes (Addendum)
Nutrition Follow-up  DOCUMENTATION CODES:   Obesity unspecified  INTERVENTION:  Initiate Vital High Protein at 40 mL/hr (960 mL goal daily volume) + Pro-Stat 30 mL TID per tube. Provides 1260 kcal, 129 grams of protein, 806 mL H2O daily.  Provide minimum free water flush of 20-30 mL Q4hrs to maintain tube patency.  NUTRITION DIAGNOSIS:   Inadequate oral intake related to inability to eat(Pt sedated and ventilated) as evidenced by NPO status.  Ongoing.  GOAL:   Provide needs based on ASPEN/SCCM guidelines  Progressing with initiation of tube feeds today.  MONITOR:   Vent status, Labs, Weight trends, TF tolerance, Skin, I & O's  REASON FOR ASSESSMENT:   Consult Enteral/tube feeding initiation and management  ASSESSMENT:   68 y.o. female with stage renal disease on hemodialysis, coronary artery disease with history of CABG 2012 and PCI, peripheral arterial disease, history of right femoral-popliteal revascularization, COPD, hypertension, hyperlipidemia, diabetes type 2, history of stroke, history of anxiety and depression, obstructive sleep apnea admitted for evaluation of chest pain. Pt found to have pulmonary edema and COPD exacerbation requiring intubation  Patient intubated and sedated. On PSV with FiO2 28%, Pressure Support 10 cmH2O, PEEP 5 cmH2O. Abdomen soft. Last BM unknown. Discussed with MD and RN. Plan is to initiate tube feeds today.  Enteral Access: 18 Fr. OGT placed 11/24; terminates in body of stomach per abdominal x-ray 11/24; 55 cm at corner of mouth  MAP: 69-84 mmHg  Patient is currently intubated on ventilator support Ve: 10.9 L/min Temp (24hrs), Avg:98.9 F (37.2 C), Min:98.2 F (36.8 C), Max:100.1 F (37.8 C)  Propofol: N/A  Medications reviewed and include: Epogen 10000 units during HD, pantoprazole, Precedex gtt.  Labs reviewed: CBG 77-96, Chloride 97, BUN 35, Creatinine 4.09, Phosphorus 5.4.  I/O: pt anuric  Diet Order:   Diet Order          Diet NPO time specified  Diet effective now             EDUCATION NEEDS:   No education needs have been identified at this time  Skin:  Skin Assessment: Reviewed RN Assessment  Last BM:  Unknown  Height:   Ht Readings from Last 1 Encounters:  08/14/2019 5\' 6"  (1.676 m)   Weight:   Wt Readings from Last 1 Encounters:  08/18/19 98.2 kg   Ideal Body Weight:  59 kg  BMI:  Body mass index is 34.94 kg/m.  Estimated Nutritional Needs:   Kcal:  1100-1400kcal/day  Protein:  >120g/day  Fluid:  UOP +1L  Jacklynn Barnacle, MS, RD, LDN Office: [phone] Pager: [phone] After Hours/Weekend Pager: [phone]

## 2019-08-18 NOTE — Progress Notes (Signed)
Follow up - Critical Care Medicine Note  Patient Details:    Kimberly Shaffer is an 68 y.o. femalewith past medical history of CAD status post CABG, hypertension, hyperlipidemia, diabetes, COPDon 2L [place], and ESRD on HD who presents to the ED complaining of shortness of breath and generalized weakness -patient with severe hypoxia progressive resp failure placed on BiPAP She has ESRD on HD last HD was Saturday Failed BiPAP and now intubated and placed on MV support  Lines, Airways, Drains: Airway 7.5 mm (Active)  Secured at (cm) 23 cm 08/15/19 0824  Measured From Lips 08/15/19 0824  Secured Location Right 08/15/19 0824  Secured By [employer] 08/15/19 0824  Tube Holder Repositioned Yes 08/15/19 0824  Cuff Pressure (cm H2O) 26 cm H2O 08/15/19 0824  Site Condition Cool;Dry 08/15/19 0824     NG/OG Tube Orogastric 18 Fr. Center mouth Xray (Active)  Cm Marking at Nare/Corner of Mouth (if applicable) 56 cm [id number]  Site Assessment Clean;Dry;Intact 08/15/19 0800  Ongoing Placement Verification No change in cm markings or external length of tube from initial placement;No change in respiratory status;No acute changes, not attributed to clinical condition 08/15/19 0800  Status Suction-low intermittent 08/15/19 0800  Drainage Appearance Bile 08/15/19 0400  Output (mL) 150 mL 08/14/19 2000     Urethral Catheter Sherie RN Latex 16 Fr. (Active)  Indication for Insertion or Continuance of Catheter Therapy based on hourly urine output monitoring and documentation for critical condition (NOT STRICT I&O) 08/15/19 0800  Site Assessment Clean;Intact;Dry 08/15/19 0800  Catheter Maintenance Bag below level of bladder;Catheter secured;Drainage bag/tubing not touching floor;Insertion date on drainage bag;No dependent loops;Seal intact 08/15/19 0800  Collection Container Standard drainage bag 08/15/19 0800  Securement Method Securing device (Describe) 08/15/19 0800  Urinary Catheter  Interventions (if applicable) Unclamped [id number]  Output (mL) 15 mL 08/15/19 0400    Anti-infectives:  Anti-infectives (From admission, onward)   Start     Dose/Rate Route Frequency Ordered Stop   08/20/19 1200  vancomycin (VANCOCIN) IVPB 1000 mg/200 mL premix     1,000 mg 200 mL/hr over 60 Minutes Intravenous Every T-Th-Sa (Hemodialysis) 08/17/19 2142     08/17/19 2000  vancomycin (VANCOCIN) 2,000 mg in sodium chloride 0.9 % 500 mL IVPB     2,000 mg 250 mL/hr over 120 Minutes Intravenous  Once 08/17/19 1935 08/17/19 2300   08/07/2019 1345  cefTRIAXone (ROCEPHIN) 1 g in sodium chloride 0.9 % 100 mL IVPB     1 g 200 mL/hr over 30 Minutes Intravenous  Once 08/16/2019 1337 07/27/2019 1520      Microbiology: Results for orders placed or performed during the hospital encounter of 08/12/2019  SARS Coronavirus 2 by RT PCR (hospital order, performed in [facility] hospital lab) Nasopharyngeal Nasopharyngeal Swab     Status: None   Collection Time: 07/21/2019 12:52 PM   Specimen: Nasopharyngeal Swab  Result Value Ref Range Status   SARS Coronavirus 2 NEGATIVE NEGATIVE Final    Comment: (NOTE) SARS-CoV-2 target nucleic acids are NOT DETECTED. The SARS-CoV-2 RNA is generally detectable in upper and lower respiratory specimens during the acute phase of infection. The lowest concentration of SARS-CoV-2 viral copies this assay can detect is 250 copies / mL. A negative result does not preclude SARS-CoV-2 infection and should not be used as the sole basis for treatment or other patient management decisions.  A negative result may occur with improper specimen collection / handling, submission of specimen other than nasopharyngeal swab, presence of  viral mutation(s) within the areas targeted by this assay, and inadequate number of viral copies (<250 copies / mL). A negative result must be combined with clinical observations, patient history, and epidemiological information. Fact Sheet for Patients:    StrictlyIdeas.no Fact Sheet for Healthcare Providers: BankingDealers.co.za This test is not yet approved or cleared  by the [place] FDA and has been authorized for detection and/or diagnosis of SARS-CoV-2 by FDA under an Emergency Use Authorization (EUA).  This EUA will remain in effect (meaning this test can be used) for the duration of the COVID-19 declaration under Section 564(b)(1) of the Act, 21 U.S.C. section 360bbb-3(b)(1), unless the authorization is terminated or revoked sooner. Performed at [facility], [address] [place], [place] [address]   Urine culture     Status: Abnormal   Collection Time: 08/02/2019  1:38 PM   Specimen: Urine, Random  Result Value Ref Range Status   Specimen Description   Final    URINE, RANDOM Performed at [facility], [address] [place], [place] [address]    Special Requests   Final    NONE Performed at [facility], [place], [place] [address]    Culture 20,000 COLONIES/mL STAPHYLOCOCCUS AUREUS (A)  Final   Report Status 08/15/2019 FINAL  Final   Organism ID, Bacteria STAPHYLOCOCCUS AUREUS (A)  Final      Susceptibility   Staphylococcus aureus - MIC*    CIPROFLOXACIN <=0.5 SENSITIVE Sensitive     GENTAMICIN <=0.5 SENSITIVE Sensitive     NITROFURANTOIN <=16 SENSITIVE Sensitive     OXACILLIN 1 SENSITIVE Sensitive     TETRACYCLINE <=1 SENSITIVE Sensitive     VANCOMYCIN <=0.5 SENSITIVE Sensitive     TRIMETH/SULFA <=10 SENSITIVE Sensitive     CLINDAMYCIN <=0.25 SENSITIVE Sensitive     RIFAMPIN <=0.5 SENSITIVE Sensitive     Inducible Clindamycin NEGATIVE Sensitive     * 20,000 COLONIES/mL STAPHYLOCOCCUS AUREUS  MRSA PCR Screening     Status: None   Collection Time: 07/21/2019 11:05 PM   Specimen: Nasal Mucosa; Nasopharyngeal  Result Value Ref Range Status   MRSA by PCR NEGATIVE NEGATIVE Final    Comment:        The GeneXpert MRSA  Assay (FDA approved for NASAL specimens only), is one component of a comprehensive MRSA colonization surveillance program. It is not intended to diagnose MRSA infection nor to guide or monitor treatment for MRSA infections. Performed at [facility], [address] [place], [place] [address]   Culture, respiratory (non-expectorated)     Status: None (Preliminary result)   Collection Time: 08/16/19  3:36 PM   Specimen: Tracheal Aspirate; Respiratory  Result Value Ref Range Status   Specimen Description   Final    TRACHEAL ASPIRATE Performed at [facility], [address] [place], [place] [address]    Special Requests   Final    NONE Performed at [facility], [address] [place], [place] [address]    Gram Stain   Final    RARE WBC PRESENT, PREDOMINANTLY PMN MODERATE GRAM POSITIVE COCCI RARE GRAM POSITIVE RODS    Culture   Final    MODERATE STAPHYLOCOCCUS AUREUS SUSCEPTIBILITIES TO FOLLOW Performed at [facility], [place] [address] [place], [place] [address]    Report Status PENDING  Incomplete  CULTURE, BLOOD (ROUTINE X 2) w Reflex to ID Panel     Status: None (Preliminary result)   Collection Time: 08/16/19  4:01 PM   Specimen: BLOOD  Result Value Ref Range Status   Specimen Description BLOOD BLOOD RIGHT HAND  Final   Special Requests   Final    BOTTLES DRAWN AEROBIC AND ANAEROBIC Blood Culture adequate volume   Culture   Final    NO GROWTH 2 DAYS Performed at [facility], [address] [place], [place] [address]    Report Status PENDING  Incomplete  CULTURE, BLOOD (ROUTINE X 2) w Reflex to ID Panel     Status: None (Preliminary result)   Collection Time: 08/16/19  4:09 PM   Specimen: BLOOD  Result Value Ref Range Status   Specimen Description BLOOD BLOOD RIGHT WRIST  Final   Special Requests   Final    BOTTLES DRAWN AEROBIC ONLY Blood Culture results may not be optimal due to an inadequate volume of blood  received in culture bottles   Culture   Final    NO GROWTH 2 DAYS Performed at [facility], [address], [place], [place] [address]    Report Status PENDING  Incomplete    Best Practice/Protocols:  VTE Prophylaxis: Heparin (SQ) and Mechanical GI Prophylaxis: Proton Pump Inhibitor Continous Sedation Hyperglycemia (ICU)   Events: 11/24 acute pulm edema and ETT intubation and MV support 11/25 failed SAT/SBT  Now with acute coronary syndrome c/w NSTEMI Findings reviewed with STEMI cardiologist on call-He reviewed EKG and  is c/w NSTEMI and cardiac ischemia needs anticoagulation 11/26 still requiring ventilator however able to tolerate SBT 11/27 SBT discontinued due to tachycardia, metoprolol adjusted, mild fever, pancultured. 11/28 start Precedex so we can decrease use of fentanyl and proceed with weaning.  Patient to get dialysis today. 11/29 cutting back on sedatives to continue weaning.  Next scheduled dialysis Tuesday  Studies: Ct Head Wo Contrast  Result Date: 08/14/2019 CLINICAL DATA:  Ataxia, stroke suspected. Additional history provided: Patient admitted yesterday for acute respiratory failure. EXAM: CT HEAD WITHOUT CONTRAST TECHNIQUE: Contiguous axial images were obtained from the base of the skull through the vertex without intravenous contrast. COMPARISON:  CTA head 09/25/2018 FINDINGS: Brain: No evidence of acute intracranial hemorrhage. No demarcated cortical infarction. No evidence of intracranial mass. No midline shift or extra-axial fluid collection. Redemonstrated advanced chronic small vessel ischemic disease. This includes prominent chronic small vessel ischemic changes within the bilateral basal ganglia, thalami and within the pons. Mild generalized parenchymal atrophy. Vascular: No hyperdense vessel.  Atherosclerotic calcifications. Skull: Normal. Negative for fracture or focal lesion. Sinuses/Orbits: Visualized orbits demonstrate no acute abnormality.  Extensive partial opacification of the visualized right maxillary sinus. Secretions are also present within the pharynx. No significant mastoid effusion. IMPRESSION: 1. No CT evidence of acute intracranial abnormality. 2. Stable generalized parenchymal atrophy and advanced chronic small vessel ischemic disease. 3. Extensive partial opacification of the partially imaged right maxillary sinus. Correlate for acute sinusitis. Electronically Signed   By: Kellie Simmering DO   On: 08/14/2019 21:00   US Venous Img Lower Unilateral Right (dvt)  Result Date: 08/06/2019 CLINICAL DATA:  Right leg swelling for 1 month. EXAM: RIGHT LOWER EXTREMITY VENOUS DOPPLER ULTRASOUND TECHNIQUE: Gray-scale sonography with graded compression, as well as color Doppler and duplex ultrasound were performed to evaluate the lower extremity deep venous systems from the level of the common femoral vein and including the common femoral, femoral, profunda femoral, popliteal and calf veins including the posterior tibial, peroneal and gastrocnemius veins when visible. The superficial great saphenous vein was also interrogated. Spectral Doppler was utilized to  evaluate flow at rest and with distal augmentation maneuvers in the common femoral, femoral and popliteal veins. COMPARISON:  None. FINDINGS: Contralateral Common Femoral Vein: Respiratory phasicity is normal and symmetric with the symptomatic side. No evidence of thrombus. Normal compressibility. Common Femoral Vein: No evidence of thrombus. Normal compressibility, respiratory phasicity and response to augmentation. Saphenofemoral Junction: No evidence of thrombus. Normal compressibility and flow on color Doppler imaging. Profunda Femoral Vein: No evidence of thrombus. Normal compressibility and flow on color Doppler imaging. Femoral Vein: No evidence of thrombus. Normal compressibility, respiratory phasicity and response to augmentation. Popliteal Vein: No evidence of thrombus. Normal  compressibility, respiratory phasicity and response to augmentation. Calf Veins: No evidence of thrombus. Normal compressibility and flow on color Doppler imaging. Superficial Great Saphenous Vein: No evidence of thrombus. Normal compressibility. Venous Reflux:  None. Other Findings: Complex fluid collection in the right popliteal fossa measuring 8.1 2.9 x 2.5 cm, likely ossified intra-articular bodies. IMPRESSION: 1. No evidence of right lower extremity DVT. 2. Complex fluid collection in the right popliteal fossa likely Baker cyst containing probable intra-articular bodies. Electronically Signed   By: Keith Rake M.D.   On: 08/06/2019 04:34   Dg Chest Port 1 View  Result Date: 08/18/2019 CLINICAL DATA:  Acute respiratory failure EXAM: PORTABLE CHEST 1 VIEW COMPARISON:  Chest x-rays dated 08/17/2019 and 08/16/2019. FINDINGS: Increased opacity at the RIGHT lung base. Probable mild atelectasis at the LEFT lung base, stable. Stable cardiomegaly. Endotracheal tube is well positioned with tip approximately 4 cm above the carina. Enteric tube passes below the diaphragm. IMPRESSION: 1. Increased opacity at the RIGHT lung base, pneumonia versus asymmetric pulmonary edema. 2. Stable cardiomegaly. 3. Support apparatus appears appropriately positioned. Electronically Signed   By: Franki Cabot M.D.   On: 08/18/2019 07:21   Dg Chest Port 1 View  Result Date: 08/17/2019 CLINICAL DATA:  Acute respiratory failure. EXAM: PORTABLE CHEST 1 VIEW COMPARISON:  08/16/2019 FINDINGS: Endotracheal tube terminates the carina approximately 4 cm above. Enteric tube courses into the abdomen with tip not imaged. The cardiac silhouette remains enlarged. Pulmonary vascular congestion and mild bilateral interstitial lung opacities are unchanged. There is improved aeration of the lung bases. There may be a trace residual left pleural effusion. No pneumothorax is identified. IMPRESSION: Persistent mild pulmonary edema with improved  aeration of the lung bases. Electronically Signed   By: Logan Bores M.D.   On: 08/17/2019 07:41   Dg Chest Port 1 View  Result Date: 08/16/2019 CLINICAL DATA:  Acute respiratory failure EXAM: PORTABLE CHEST 1 VIEW COMPARISON:  Radiograph 08/14/2019 FINDINGS: Endotracheal tube in the mid trachea, approximately 5.2 cm from the carina. Transesophageal tube tip and side port distal to the GE junction. Continued improvement in the bilateral interstitial and airspace opacity with central venous congestion. Cardiomegaly stable from prior. Aorta is calcified. Small left effusion remains. IMPRESSION: Features likely reflect improving pulmonary edema when compared to prior. Trace residual left effusion. Aortic Atherosclerosis (ICD10-I70.0). Satisfactory positioning of lines and tubes. Electronically Signed   By: Lovena Le M.D.   On: 08/16/2019 02:46   Dg Chest Port 1 View  Result Date: 08/14/2019 CLINICAL DATA:  Status post intubation EXAM: PORTABLE CHEST 1 VIEW COMPARISON:  Film from the previous day. FINDINGS: Endotracheal tube and gastric catheter are noted in satisfactory position. Cardiac shadow is stable. Aortic calcifications are seen. The previously seen vascular congestion and pulmonary edema has improved slightly in the interval from the prior exam. No bony abnormality is seen. IMPRESSION: Slight  improvement in the degree of pulmonary edema when compare with the prior exam. Electronically Signed   By: Inez Catalina M.D.   On: 08/14/2019 03:35   Dg Chest Portable 1 View  Result Date: 08/12/2019 CLINICAL DATA:  Post intubation. EXAM: PORTABLE CHEST 1 VIEW COMPARISON:  Chest x-ray from same day. FINDINGS: Interval intubation with endotracheal tube in good position 2.5 cm above the carina. Unchanged cardiomegaly with diffuse interstitial opacities. New mid to lower lung patchy airspace disease, worse on the right. No pneumothorax or large pleural effusion. No acute osseous abnormality. IMPRESSION: 1.  Appropriately positioned endotracheal tube. 2. Worsening pulmonary edema. Electronically Signed   By: Titus Dubin M.D.   On: 08/02/2019 16:42   Dg Chest Port 1 View  Result Date: 08/09/2019 CLINICAL DATA:  Shortness of breath. Additional history provided: Generalized weakness and stomach upset. Dialysis patient with last treatment Saturday. EXAM: PORTABLE CHEST 1 VIEW COMPARISON:  Chest radiograph 08/05/2019 FINDINGS: Stable cardiomegaly.  Aortic atherosclerosis. As before, there are bilateral interstitial opacities with a mid to lower lung predominance. No evidence of pneumothorax or sizable pleural effusion. No acute bony abnormality. IMPRESSION: Stable cardiomegaly. Bilateral interstitial pulmonary opacities are similar to exam 08/05/2019. This may reflect interstitial edema or infection. Aortic atherosclerosis. Electronically Signed   By: Kellie Simmering DO   On: 07/25/2019 12:11   Dg Chest Portable 1 View  Result Date: 08/05/2019 CLINICAL DATA:  Left side chest pain EXAM: PORTABLE CHEST 1 VIEW COMPARISON:  07/06/2019 FINDINGS: Cardiomegaly with vascular congestion. Diffuse interstitial prominence likely reflects interstitial edema. No confluent opacities or effusions. Aortic atherosclerosis. No acute bony abnormality. IMPRESSION: Cardiomegaly, suspect mild interstitial edema. Electronically Signed   By: Rolm Baptise M.D.   On: 08/05/2019 21:56   Dg Abd Portable 1v  Result Date: 07/28/2019 CLINICAL DATA:  Orogastric tube placement EXAM: PORTABLE ABDOMEN - 1 VIEW COMPARISON:  06/26/2018 FINDINGS: NG tube in the body the stomach.  Normal bowel gas pattern. Bibasilar airspace disease. IMPRESSION: NG tube in the body the stomach.  Normal bowel gas pattern. Electronically Signed   By: Franchot Gallo M.D.   On: 08/02/2019 17:39    Consults: [facility] cardiology  Subjective:    Overnight Issues: On Precedex to decrease use of opiates and benzos.  Tolerating SBT but mental status still  lethargic. Continue weaning efforts.  Continue to decrease sedatives.  Heart rate improved on increased metoprolol. Objective:  Vital signs for last 24 hours: Temp:  [98.2 F (36.8 C)-100.1 F (37.8 C)] 98.6 F (37 C) (11/29 1600) Pulse Rate:  [60-104] 88 (11/29 1600) Resp:  [13-29] 25 (11/29 1600) BP: (80-135)/(55-79) 96/56 (11/29 1600) SpO2:  [93 %-100 %] 93 % (11/29 1600) FiO2 (%):  [28 %] 28 % (11/29 1604) Weight:  [98.2 kg] 98.2 kg (11/29 0500)  Hemodynamic parameters for last 24 hours:    Intake/Output from previous day: 11/28 0701 - 11/29 0700 In: 683.4 [I.V.:148.6; NG/GT:20; IV Piggyback:499.8] Out: [id number] [Emesis/NG output:250]  Intake/Output this shift: Total I/O In: 118.6 [I.V.:118.6] Out: 50 [Emesis/NG output:50]  Vent settings for last 24 hours: Vent Mode: Spontaneous FiO2 (%):  [28 %] 28 % PEEP:  [5 cmH20] 5 cmH20 Pressure Support:  [5 cmH20-10 cmH20] 5 cmH20 Plateau Pressure:  [20 cmH20] 20 cmH20  Physical Exam:  GENERAL: Sedated, mechanically ventilated, will open eyes, tracks and follows commands intermittently and not reliably. HEAD: Normocephalic, atraumatic.  EYES: Pupils equal, round, reactive to light.  No scleral icterus.   MOUTH: Orotracheally intubated, OG  in place. NECK: Supple.  Trachea midline, no JVD PULMONARY: Coarse breath sounds, no wheezes or rhonchi noted.  Good air entry bilaterally on vent.  Synchronous with the vent.  Tolerating SBT. CARDIOVASCULAR: S1 and S2. Regular rate and rhythm. No murmurs, rubs, or gallops.  GASTROINTESTINAL: Soft, nondistended. No masses. Positive bowel sounds.   MUSCULOSKELETAL: No swelling, clubbing, or edema.  NEUROLOGIC: Sedated, does open eyes to name and minimal stimulus.Tracks, follows commands intermittently. SKIN:intact,warm,dry  Assessment/Plan:   ACUTE Hypoxic Respiratory Failure due to acute pulmonary edema in the setting of acute NSTEMI  continue Full MV support  Limitation to extubation his  mental status (preceded admission)- lethargy, negative CT on admission  Management of NSTEMI, volume overload, management with HD  Wean Fio2 and PEEP as tolerated  Continue SBT as tolerated, hopefully extubate in a.m.  SUSPECT ACUTE SYSTOLIC CARDIAC FAILURE- ECHO pending ACUTE NSTEMI  Case discussed with Dr Clayborn Bigness on call for STEMI (per Dr. Mortimer Fries 11/25)  EKG shows ST changes I, AVL with reciprocal ST depressions  Heparin infusion, was discontinued due to coffee-ground material on OG  Follow up ECHO and Troponins  Previously evaluated by [facility] Card[facility] 11/17 see consult note, have been reconsulted  History of severe three-vessel CAD status post CABG and PCI, very difficult due to tortuous vessels  Echo at [facility] 06/21/2019 showed EF >55%.  Myocardial stress test at [facility] 07/22/2019 showed moderate intensity and large in size inferolateral defect that was partially reversible, apex hypokinetic, LVEF was documented as 45% at that time.  Echo performed 11/25, [facility], LVEF 20 to 30%, clinically myocardial injury, patient has noted to have tortuous anatomy and prior CABG with very difficult PCI at [facility] in October Adjusted beta-blockers, she has better heart rate control  ESRD  on dialysis TTS  Nephrology following, directing dialysis, appreciate input  Monitor electrolytes, management per renal and pharmacy  Foley catheter discontinued 11/27  avoid nephrotoxic agents  Dialysis for volume management as needed   NEUROLOGY  intubated and lightly sedated, mostly lethargic when sedation lightened  CT head negative  Daily wake up assessment  CARDIAC  ICU monitoring  NSTEMI on heparin infusion, heparin infusion discontinued 11/27 due to coffee-ground material in OG   Will corroborate cardiology still following-consult sent 11/26   2D echo reading as above  ID  Mild fever 11/27, pancultured  Preliminary Staph aureus in the sputum, sensitivities  pending  Vancomycin started 11/28  GI  GI PROPHYLAXIS: Switched to Protonix twice daily after coffee-ground material noted in OG  No further coffee-ground material  NUTRITIONAL STATUS  DIET-->TF's as tolerated  Constipation protocol as indicated  ENDO  will use ICU hypoglycemic\Hyperglycemia protocol if indicated   ELECTROLYTES  follow labs as needed  replace as needed, management per renal due to HD requirement  pharmacy consulted and following  HISTORY of OSA  This issue adds complexity to her management vis--vis extubation.  Will likely require nocturnal CPAP/BiPAP after extubation.    LOS: 5 days   Additional comments:None  Critical Care Total Time*: 40 minutes   C. Derrill Kay, MD [facility] 08/18/2019  *Care during the described time interval was provided by me and/or other providers on the critical care team.  I have reviewed this patient's available data, including medical history, events of note, physical examination and test results as part of my evaluation.  **This note was dictated using voice recognition software/Dragon.  Despite best efforts to proofread, errors can occur which can change the meaning.  Any change  was purely unintentional.

## 2019-08-18 NOTE — Consult Note (Signed)
Pharmacy Antibiotic Note  Kimberly Shaffer is a 68 y.o. female admitted on 07/28/2019 with pneumonia.  Pharmacy has been consulted for Vancomycin dosing. Respiratory culture positive for Staphylococcus Aureus.  Currently on ventilator. Patient receives scheduled dialysis on Tuesday, Thursday, and Saturdays. Currently receiving dialysis intermittently while inpatient per Nephrologist, but plans to restart previous schedule this week.   Plan: Patient will receive Vancomycin 2g IV x 1 as loading dose(had dialysis session today. Will order Vancomycin 1g IV to be given in the last hour of each dialysis session on Tuesday, Thursday and Saturdays unless otherwise noted that the patient's schedule will change.  Height: 5\' 6"  (167.6 cm) Weight: 216 lb 7.9 oz (98.2 kg) IBW/kg (Calculated) : 59.3  Temp (24hrs), Avg:98.9 F (37.2 C), Min:98.2 F (36.8 C), Max:100.1 F (37.8 C)  Recent Labs  Lab 07/24/2019 1043 08/14/19 0002 08/15/19 0620 08/16/19 0518 08/17/19 0504 08/18/19 0454  WBC 9.1 8.8 8.8 10.6*  --  15.6*  CREATININE 9.52* 10.24* 6.32* 4.09* 6.26* 4.09*    Estimated Creatinine Clearance: 15.6 mL/min (A) (by C-G formula based on SCr of 4.09 mg/dL (H)).    Allergies  Allergen Reactions  . Nystatin Hives and Itching  . Sulfa Antibiotics Swelling, Hives and Rash  . Niaspan [Niacin] Other (See Comments) and Hives    Antimicrobials this admission: Vancomycin 11/28 >>  Rocephin 11/24 x 1   Microbiology results: 11/28 BCx: NGTD 11/24 UCx: 20k Staph Aureus  11/27 Respiratory cx: Staph Aureus   Thank you for allowing pharmacy to be a part of this patient's care.  Carmalita Wakefield A 08/18/2019 2:51 PM

## 2019-08-18 NOTE — Progress Notes (Signed)
Notified Maggie NP regarding patient blood pressure being 64/42. Metoprolol given earlier. At this time orders to recheck blood pressure in 30 minutes. No additional orders at this time.

## 2019-08-18 NOTE — Progress Notes (Signed)
[facility], [place] 08/18/19  Subjective:  Patient still on the ventilator this a.m. Underwent hemodialysis yesterday. Remains critically ill.   Objective:  Vital signs in last 24 hours:  Temp:  [98.2 F (36.8 C)-100.1 F (37.8 C)] 98.7 F (37.1 C) (11/29 0800) Pulse Rate:  [60-104] 86 (11/29 1000) Resp:  [13-29] 25 (11/29 1000) BP: (80-135)/(58-79) 98/62 (11/29 1000) SpO2:  [94 %-100 %] 97 % (11/29 1000) FiO2 (%):  [28 %] 28 % (11/29 0842) Weight:  [98.2 kg] 98.2 kg (11/29 0500)  Weight change: 0.5 kg Filed Weights   08/16/19 0500 08/17/19 0500 08/18/19 0500  Weight: 98.6 kg 97.7 kg 98.2 kg    Intake/Output:    Intake/Output Summary (Last 24 hours) at 08/18/2019 1047 Last data filed at 08/18/2019 0806 Gross per 24 hour  Intake 622.31 ml  Output 1720 ml  Net -1097.69 ml     Physical Exam: General:  Critically ill appearing  HEENT  anicteric, moist oral mucous membranes, ETT in place  Pulm/lungs  bilateral rales and rhonchi, vent assisted  CVS/Heart  S1S2 no rubs  Abdomen:   Soft, nontender, BS present  Extremities:  Trace pitting edema  Neurologic:  Intubated, sedated  Skin:  No acute rashes  Access:  Left arm AV fistula       Basic Metabolic Panel:  Recent Labs  Lab 08/14/19 0002  08/15/19 0620 08/15/19 1445 08/16/19 0518 08/17/19 0504 08/17/19 1312 08/18/19 0454  NA 138  --  137  --  137 136  --  138  K 5.0  --  3.8  --  3.1* 3.7  --  4.0  CL 102  --  96*  --  94* 93*  --  97*  CO2 18*  --  22  --  25 23  --  23  GLUCOSE 115*  --  98  --  103* 99  --  102*  BUN 57*  --  32*  --  25* 56*  --  35*  CREATININE 10.24*  --  6.32*  --  4.09* 6.26*  --  4.09*  CALCIUM 7.8*  --  7.9*  --  8.5* 8.6*  --  8.5*  MG 1.6*  --  1.9  --   --  2.0  --   --   PHOS 7.4*   < > 6.5* 2.6 5.1* 7.2* 8.9* 5.4*   < > = values in this interval not displayed.     CBC: Recent Labs  Lab 07/31/2019 1043 08/14/19 0002 08/15/19 0620  08/16/19 0518 08/18/19 0454  WBC 9.1 8.8 8.8 10.6* 15.6*  NEUTROABS  --   --  6.9  --  12.4*  HGB 8.5* 8.2* 7.6* 7.8* 8.6*  HCT 27.1* 24.7* 24.3* 24.9* 27.7*  MCV 102.7* 96.5 102.1* 100.4* 103.0*  PLT 212 195 209 196 180      Lab Results  Component Value Date   HEPBSAG NON REACTIVE 08/14/2019   HEPBSAB Reactive 01/11/2018      Microbiology:  Recent Results (from the past 240 hour(s))  SARS Coronavirus 2 by RT PCR (hospital order, performed in [facility] hospital lab) Nasopharyngeal Nasopharyngeal Swab     Status: None   Collection Time: 08/14/2019 12:52 PM   Specimen: Nasopharyngeal Swab  Result Value Ref Range Status   SARS Coronavirus 2 NEGATIVE NEGATIVE Final    Comment: (NOTE) SARS-CoV-2 target nucleic acids are NOT DETECTED. The SARS-CoV-2 RNA is generally detectable in upper and lower respiratory  specimens during the acute phase of infection. The lowest concentration of SARS-CoV-2 viral copies this assay can detect is 250 copies / mL. A negative result does not preclude SARS-CoV-2 infection and should not be used as the sole basis for treatment or other patient management decisions.  A negative result may occur with improper specimen collection / handling, submission of specimen other than nasopharyngeal swab, presence of viral mutation(s) within the areas targeted by this assay, and inadequate number of viral copies (<250 copies / mL). A negative result must be combined with clinical observations, patient history, and epidemiological information. Fact Sheet for Patients:   StrictlyIdeas.no Fact Sheet for Healthcare Providers: BankingDealers.co.za This test is not yet approved or cleared  by the [place] FDA and has been authorized for detection and/or diagnosis of SARS-CoV-2 by FDA under an Emergency Use Authorization (EUA).  This EUA will remain in effect (meaning this test can be used) for the duration of  the COVID-19 declaration under Section 564(b)(1) of the Act, 21 U.S.C. section 360bbb-3(b)(1), unless the authorization is terminated or revoked sooner. Performed at [facility], [address] [place], [place] [address]   Urine culture     Status: Abnormal   Collection Time: 07/22/2019  1:38 PM   Specimen: Urine, Random  Result Value Ref Range Status   Specimen Description   Final    URINE, RANDOM Performed at [facility], [address] [place], [place] [address]    Special Requests   Final    NONE Performed at [facility], [place], [place] [address]    Culture 20,000 COLONIES/mL STAPHYLOCOCCUS AUREUS (A)  Final   Report Status 08/15/2019 FINAL  Final   Organism ID, Bacteria STAPHYLOCOCCUS AUREUS (A)  Final      Susceptibility   Staphylococcus aureus - MIC*    CIPROFLOXACIN <=0.5 SENSITIVE Sensitive     GENTAMICIN <=0.5 SENSITIVE Sensitive     NITROFURANTOIN <=16 SENSITIVE Sensitive     OXACILLIN 1 SENSITIVE Sensitive     TETRACYCLINE <=1 SENSITIVE Sensitive     VANCOMYCIN <=0.5 SENSITIVE Sensitive     TRIMETH/SULFA <=10 SENSITIVE Sensitive     CLINDAMYCIN <=0.25 SENSITIVE Sensitive     RIFAMPIN <=0.5 SENSITIVE Sensitive     Inducible Clindamycin NEGATIVE Sensitive     * 20,000 COLONIES/mL STAPHYLOCOCCUS AUREUS  MRSA PCR Screening     Status: None   Collection Time: 07/25/2019 11:05 PM   Specimen: Nasal Mucosa; Nasopharyngeal  Result Value Ref Range Status   MRSA by PCR NEGATIVE NEGATIVE Final    Comment:        The GeneXpert MRSA Assay (FDA approved for NASAL specimens only), is one component of a comprehensive MRSA colonization surveillance program. It is not intended to diagnose MRSA infection nor to guide or monitor treatment for MRSA infections. Performed at [facility], [address] [place], [place] [address]   Culture, respiratory (non-expectorated)     Status: None (Preliminary result)    Collection Time: 08/16/19  3:36 PM   Specimen: Tracheal Aspirate; Respiratory  Result Value Ref Range Status   Specimen Description   Final    TRACHEAL ASPIRATE Performed at [facility], [address], [place], [place] [address]    Special Requests   Final    NONE Performed at [facility], [address] [place], [place] [address]    Gram Stain   Final    RARE WBC PRESENT, PREDOMINANTLY PMN MODERATE GRAM  POSITIVE COCCI RARE GRAM POSITIVE RODS    Culture   Final    MODERATE STAPHYLOCOCCUS AUREUS SUSCEPTIBILITIES TO FOLLOW Performed at [facility], [place] [address] [place], [place] [address]    Report Status PENDING  Incomplete  CULTURE, BLOOD (ROUTINE X 2) w Reflex to ID Panel     Status: None (Preliminary result)   Collection Time: 08/16/19  4:01 PM   Specimen: BLOOD  Result Value Ref Range Status   Specimen Description BLOOD BLOOD RIGHT HAND  Final   Special Requests   Final    BOTTLES DRAWN AEROBIC AND ANAEROBIC Blood Culture adequate volume   Culture   Final    NO GROWTH 2 DAYS Performed at [facility], [address], [place], [place] [address]    Report Status PENDING  Incomplete  CULTURE, BLOOD (ROUTINE X 2) w Reflex to ID Panel     Status: None (Preliminary result)   Collection Time: 08/16/19  4:09 PM   Specimen: BLOOD  Result Value Ref Range Status   Specimen Description BLOOD BLOOD RIGHT WRIST  Final   Special Requests   Final    BOTTLES DRAWN AEROBIC ONLY Blood Culture results may not be optimal due to an inadequate volume of blood received in culture bottles   Culture   Final    NO GROWTH 2 DAYS Performed at [facility], [address] [place], [place] [address]    Report Status PENDING  Incomplete    Coagulation Studies: No results for input(s): LABPROT, INR in the last 72 hours.  Urinalysis: No results for input(s): COLORURINE, LABSPEC, PHURINE, GLUCOSEU, HGBUR, BILIRUBINUR, KETONESUR, PROTEINUR,  UROBILINOGEN, NITRITE, LEUKOCYTESUR in the last 72 hours.  Invalid input(s): APPERANCEUR    Imaging: Dg Chest Port 1 View  Result Date: 08/18/2019 CLINICAL DATA:  Acute respiratory failure EXAM: PORTABLE CHEST 1 VIEW COMPARISON:  Chest x-rays dated 08/17/2019 and 08/16/2019. FINDINGS: Increased opacity at the RIGHT lung base. Probable mild atelectasis at the LEFT lung base, stable. Stable cardiomegaly. Endotracheal tube is well positioned with tip approximately 4 cm above the carina. Enteric tube passes below the diaphragm. IMPRESSION: 1. Increased opacity at the RIGHT lung base, pneumonia versus asymmetric pulmonary edema. 2. Stable cardiomegaly. 3. Support apparatus appears appropriately positioned. Electronically Signed   By: Franki Cabot M.D.   On: 08/18/2019 07:21   Dg Chest Port 1 View  Result Date: 08/17/2019 CLINICAL DATA:  Acute respiratory failure. EXAM: PORTABLE CHEST 1 VIEW COMPARISON:  08/16/2019 FINDINGS: Endotracheal tube terminates the carina approximately 4 cm above. Enteric tube courses into the abdomen with tip not imaged. The cardiac silhouette remains enlarged. Pulmonary vascular congestion and mild bilateral interstitial lung opacities are unchanged. There is improved aeration of the lung bases. There may be a trace residual left pleural effusion. No pneumothorax is identified. IMPRESSION: Persistent mild pulmonary edema with improved aeration of the lung bases. Electronically Signed   By: Logan Bores M.D.   On: 08/17/2019 07:41     Medications:   . sodium chloride 250 mL (08/15/19 2144)  . dexmedetomidine (PRECEDEX) IV infusion 0.52 mcg/kg/hr (08/18/19 0902)  . fentaNYL infusion INTRAVENOUS Stopped (08/17/19 0940)  . [START ON 08/20/2019] vancomycin     . chlorhexidine gluconate (MEDLINE KIT)  15 mL Mouth Rinse BID  . Chlorhexidine Gluconate Cloth  6 each Topical Q0600  . epoetin (EPOGEN/PROCRIT) injection  10,000 Units Intravenous Q T,Th,Sa-HD  . feeding  supplement (PRO-STAT SUGAR FREE 64)  30 mL Per Tube  TID  . feeding supplement (VITAL HIGH PROTEIN)  1,000 mL Per Tube Q24H  . heparin injection (subcutaneous)  5,000 Units Subcutaneous Q12H  . ipratropium-albuterol  3 mL Nebulization Q6H  . mouth rinse  15 mL Mouth Rinse 10 times per day  . metoprolol tartrate  25 mg Per Tube BID  . pantoprazole (PROTONIX) IV  40 mg Intravenous BID  . sodium chloride flush  3 mL Intravenous Q12H   sodium chloride, acetaminophen, fentaNYL, fentaNYL (SUBLIMAZE) injection, fentaNYL (SUBLIMAZE) injection, midazolam, midazolam, ondansetron (ZOFRAN) IV, sodium chloride flush  Assessment/ Plan:  68 y.o. female with stage renal disease on hemodialysis, coronary artery disease with history of CABG 2012 and PCI, peripheral arterial disease, history of right femoral-popliteal revascularization, COPD, hypertension, hyperlipidemia, diabetes type 2, history of stroke, history of anxiety and depression, obstructive sleep apnea admitted for evaluation of chest pain  Active Problems:   Respiratory failure ([place])   #. ESRD  [facility]// TuThSa-1//TW//98kg//[facility] -Patient completed hemodialysis yesterday successfully.  No acute indication for dialysis today.  Neck schedule dialysis treatment will be on Tuesday.  #. Anemia of CKD  Lab Results  Component Value Date   HGB 8.6 (L) 08/18/2019  Hemoglobin now up to 8.6.  Continue to monitor CBC and maintain the patient on Epogen 10,000 units IV with dialysis treatments.  #. SHPTH     Component Value Date/Time   PTH 472 (H) 07/06/2019 1455   Lab Results  Component Value Date   PHOS 5.4 (H) 08/18/2019  Phosphorus improved to 5.4.  Continue to periodically monitor.   #Acute respiratory failure. Weaning attempts are ongoing but patient still on the ventilator at the moment.  Continue ultrafiltration to aid with weaning from the ventilator.    LOS: 5 Kimberly Shaffer 11/29/202010:47 AM  [place], [place]

## 2019-08-19 ENCOUNTER — Inpatient Hospital Stay: Payer: Medicare Other

## 2019-08-19 LAB — BASIC METABOLIC PANEL
Anion gap: 20 — ABNORMAL HIGH (ref 5–15)
BUN: 78 mg/dL — ABNORMAL HIGH (ref 8–23)
CO2: 23 mmol/L (ref 22–32)
Calcium: 8.7 mg/dL — ABNORMAL LOW (ref 8.9–10.3)
Chloride: 96 mmol/L — ABNORMAL LOW (ref 98–111)
Creatinine, Ser: 6.31 mg/dL — ABNORMAL HIGH (ref 0.44–1.00)
GFR calc Af Amer: 7 mL/min — ABNORMAL LOW (ref >60)
GFR calc non Af Amer: 6 mL/min — ABNORMAL LOW (ref >60)
Glucose, Bld: 157 mg/dL — ABNORMAL HIGH (ref 70–99)
Potassium: 4.5 mmol/L (ref 3.5–5.1)
Sodium: 139 mmol/L (ref 135–145)

## 2019-08-19 LAB — CBC
HCT: 29.2 % — ABNORMAL LOW (ref 36.0–46.0)
Hemoglobin: 9.3 g/dL — ABNORMAL LOW (ref 12.0–15.0)
MCH: 31.8 pg (ref 26.0–34.0)
MCHC: 31.8 g/dL (ref 30.0–36.0)
MCV: 100 fL (ref 80.0–100.0)
Platelets: 168 10*3/uL (ref 150–400)
RBC: 2.92 MIL/uL — ABNORMAL LOW (ref 3.87–5.11)
RDW: 14.7 % (ref 11.5–15.5)
WBC: 16.2 10*3/uL — ABNORMAL HIGH (ref 4.0–10.5)
nRBC: 0.6 % — ABNORMAL HIGH (ref 0.0–0.2)

## 2019-08-19 LAB — CULTURE, RESPIRATORY W GRAM STAIN

## 2019-08-19 LAB — PHOSPHORUS: Phosphorus: 5.7 mg/dL — ABNORMAL HIGH (ref 2.5–4.6)

## 2019-08-19 LAB — GLUCOSE, CAPILLARY
Glucose-Capillary: 124 mg/dL — ABNORMAL HIGH (ref 70–99)
Glucose-Capillary: 135 mg/dL — ABNORMAL HIGH (ref 70–99)
Glucose-Capillary: 135 mg/dL — ABNORMAL HIGH (ref 70–99)
Glucose-Capillary: 156 mg/dL — ABNORMAL HIGH (ref 70–99)
Glucose-Capillary: 176 mg/dL — ABNORMAL HIGH (ref 70–99)

## 2019-08-19 LAB — PROTIME-INR
INR: 1.3 — ABNORMAL HIGH (ref 0.8–1.2)
Prothrombin Time: 16.3 seconds — ABNORMAL HIGH (ref 11.4–15.2)

## 2019-08-19 LAB — TRIGLYCERIDES: Triglycerides: 126 mg/dL (ref <150)

## 2019-08-19 LAB — LACTIC ACID, PLASMA: Lactic Acid, Venous: 2.2 mmol/L (ref 0.5–1.9)

## 2019-08-19 LAB — MAGNESIUM: Magnesium: 2.1 mg/dL (ref 1.7–2.4)

## 2019-08-19 MED ORDER — SODIUM CHLORIDE 0.9 % IV SOLN
0.0000 ug/min | INTRAVENOUS | Status: DC
  Administered 2019-08-19: 60 ug/min via INTRAVENOUS
  Filled 2019-08-19: qty 4
  Filled 2019-08-19: qty 40
  Filled 2019-08-19 (×2): qty 4

## 2019-08-19 MED ORDER — SODIUM CHLORIDE 0.9 % IV SOLN: 250.0000 mL | INTRAVENOUS | Status: DC

## 2019-08-19 MED ORDER — PRISMASOL BGK 0/2.5 32-2.5 MEQ/L IV SOLN
INTRAVENOUS | Status: DC
  Administered 2019-08-20 – 2019-08-21 (×7): via INTRAVENOUS_CENTRAL
  Filled 2019-08-19: qty 5000

## 2019-08-19 MED ORDER — FENTANYL CITRATE (PF) 100 MCG/2ML IJ SOLN
INTRAMUSCULAR | Status: AC
  Filled 2019-08-19: qty 2

## 2019-08-19 MED ORDER — MIDAZOLAM HCL 2 MG/2ML IJ SOLN
2.0000 mg | Freq: Once | INTRAMUSCULAR | Status: AC
  Administered 2019-08-19: 2 mg via INTRAVENOUS

## 2019-08-19 MED ORDER — MIDAZOLAM HCL 2 MG/2ML IJ SOLN
INTRAMUSCULAR | Status: AC
  Filled 2019-08-19: qty 2

## 2019-08-19 MED ORDER — FENTANYL CITRATE (PF) 100 MCG/2ML IJ SOLN
50.0000 ug | Freq: Once | INTRAMUSCULAR | Status: AC
  Administered 2019-08-19: 50 ug via INTRAVENOUS

## 2019-08-19 MED ORDER — DOPAMINE-DEXTROSE 3.2-5 MG/ML-% IV SOLN
0.0000 ug/kg/min | INTRAVENOUS | Status: DC
  Administered 2019-08-19: 5 ug/kg/min via INTRAVENOUS
  Filled 2019-08-19: qty 250

## 2019-08-19 MED ORDER — HEPARIN SODIUM (PORCINE) 1000 UNIT/ML IJ SOLN
1000.0000 [IU] | INTRAMUSCULAR | Status: DC | PRN
  Filled 2019-08-19 (×2): qty 3
  Filled 2019-08-19: qty 6
  Filled 2019-08-19: qty 3

## 2019-08-19 MED ORDER — SODIUM CHLORIDE 0.9 % FOR CRRT
INTRAVENOUS_CENTRAL | Status: DC | PRN
  Filled 2019-08-19: qty 1000

## 2019-08-19 MED ORDER — SODIUM CHLORIDE 0.9 % IV SOLN
0.0000 ug/min | INTRAVENOUS | Status: DC
  Administered 2019-08-19: 20 ug/min via INTRAVENOUS
  Administered 2019-08-19 (×2): 60 ug/min via INTRAVENOUS
  Filled 2019-08-19 (×2): qty 1
  Filled 2019-08-19 (×2): qty 10
  Filled 2019-08-19: qty 1

## 2019-08-19 MED ORDER — PRISMASOL BGK 0/2.5 32-2.5 MEQ/L IV SOLN
INTRAVENOUS | Status: DC
  Administered 2019-08-20 (×2): via INTRAVENOUS_CENTRAL
  Filled 2019-08-19: qty 5000

## 2019-08-19 MED ORDER — VANCOMYCIN HCL IN DEXTROSE 1-5 GM/200ML-% IV SOLN
1000.0000 mg | INTRAVENOUS | Status: DC
  Administered 2019-08-19 – 2019-08-21 (×3): 1000 mg via INTRAVENOUS
  Filled 2019-08-19 (×4): qty 200

## 2019-08-19 MED ORDER — PRISMASOL BGK 0/2.5 32-2.5 MEQ/L IV SOLN
INTRAVENOUS | Status: DC
  Administered 2019-08-20 (×2): via INTRAVENOUS_CENTRAL
  Filled 2019-08-19: qty 5000

## 2019-08-19 MED ORDER — HYDROCORTISONE NA SUCCINATE PF 100 MG IJ SOLR
100.0000 mg | Freq: Two times a day (BID) | INTRAMUSCULAR | Status: DC
  Administered 2019-08-19 – 2019-08-22 (×6): 100 mg via INTRAVENOUS
  Filled 2019-08-19 (×7): qty 2

## 2019-08-19 MED ORDER — HEPARIN SODIUM (PORCINE) 1000 UNIT/ML DIALYSIS
1000.0000 [IU] | INTRAMUSCULAR | Status: DC | PRN
  Administered 2019-08-19 – 2019-08-20 (×2): 3000 [IU] via INTRAVENOUS_CENTRAL
  Administered 2019-08-21 (×2): 1400 [IU] via INTRAVENOUS_CENTRAL
  Filled 2019-08-19 (×4): qty 6
  Filled 2019-08-19: qty 3
  Filled 2019-08-19: qty 4
  Filled 2019-08-19: qty 6

## 2019-08-19 NOTE — Consult Note (Addendum)
Pharmacy Antibiotic Note  Kimberly Shaffer is a 68 y.o. female admitted on 08/08/2019 with pneumonia.  Pharmacy has been consulted for Vancomycin dosing. Respiratory culture positive for Staphylococcus Aureus.  Currently on ventilator. Patient receives scheduled dialysis on Tuesday, Thursday, and Saturdays. Patient being transitioned to CRRT on 11/30.   Plan: Continue vancomycin 1g IV Q24hr while patient receiving CRRT. Will plan to check level prior to dose on 12/2 to help guide therapy.   Height: 5\' 6"  (167.6 cm) Weight: 210 lb 12.2 oz (95.6 kg) IBW/kg (Calculated) : 59.3  Temp (24hrs), Avg:99.4 F (37.4 C), Min:98.5 F (36.9 C), Max:102.2 F (39 C)  Recent Labs  Lab 08/14/19 0002 08/15/19 0620 08/16/19 0518 08/17/19 0504 08/18/19 0454 08/19/19 0552  WBC 8.8 8.8 10.6*  --  15.6* 16.2*  CREATININE 10.24* 6.32* 4.09* 6.26* 4.09* 6.31*  LATICACIDVEN  --   --   --   --   --  2.2*    Estimated Creatinine Clearance: 9.9 mL/min (A) (by C-G formula based on SCr of 6.31 mg/dL (H)).    Allergies  Allergen Reactions  . Nystatin Hives and Itching  . Sulfa Antibiotics Swelling, Hives and Rash  . Niaspan [Niacin] Other (See Comments) and Hives    Antimicrobials this admission: Ceftriaxone 11/24 x 1 Vancomycin 11/28 >>   Microbiology results: 11/28 BCx: no growth x 3 days  11/24 UCx: 20k Staph Aureus  11/27 Tracheal Aspirate: MRSA   Thank you for allowing pharmacy to be a part of this patient's care.  Simpson,Michael L 08/19/2019 2:40 PM

## 2019-08-19 NOTE — Procedures (Signed)
Hemodialysis Catheter Insertion Procedure Note JAIDY POFAHL [id number] [birth year]  Procedure: Insertion of Hemodialysis Catheter Indications: Dialysis Access   Procedure Details Consent: Risks of procedure as well as the alternatives and risks of each were explained to the (patient/caregiver).  Consent for procedure obtained. Time Out: Verified patient identification, verified procedure, site/side was marked, verified correct patient position, special equipment/implants available, medications/allergies/relevent history reviewed, required imaging and test results available.  Performed  Maximum sterile technique was used including antiseptics, cap, gloves, gown, hand hygiene, mask and sheet. Skin prep: Chlorhexidine; local anesthetic administered Triple lumen hemodialysis catheter was inserted into left femoral vein due to multiple attempts, no other available access and patient being a dialysis patient using the Seldinger technique.  Evaluation Blood flow good Complications: No apparent complications Patient did tolerate procedure well. Chest X-ray ordered to verify placement.  CXR: not indicated    Attempted to insert left jugular trialysis catheter utilizing ultrasound, however unable to thread guidewire.  Therefore, placed left femoral trialysis catheter utilizing ultrasound no complications noted during or following procedure.  Marda Stalker, [place] Pager [phone] (please enter 7 digits) PCCM Consult Pager [phone] (please enter 7 digits)

## 2019-08-19 NOTE — Progress Notes (Signed)
CRITICAL CARE PROGRESS NOTE    Name: Kimberly Shaffer MRN: [id number] DOB: [birth year]     LOS: 6   SUBJECTIVE FINDINGS & SIGNIFICANT EVENTS   Patient description:   Kimberly Shaffer is an 68 y.o. femalewith past medical history of CAD status post CABG, hypertension, hyperlipidemia, diabetes, COPDon 2L [place], and ESRD on HD who presents to the ED complaining of shortness of breath and generalized weakness -patient with severe hypoxia progressive resp failure placed on BiPAP She has ESRD on HD last HD was Saturday Failed BiPAP and now intubated and placed on MV support  Lines / Drains: PIVx3  Cultures / Sepsis markers: Tracheal aspirate MRSA + Urine Cx - Staph aureus - 20K  Antibiotics: Vancomycin   Protocols / Consultants: Nephrology  Critical care    Tests / Events: CRRT  Overnight: Remains critically ill with septic shock   PAST MEDICAL HISTORY   Past Medical History:  Diagnosis Date  . Asthma   . CAD (coronary artery disease)   . Chronic lower back pain   . COPD (chronic obstructive pulmonary disease) ([place])   . Depression   . Diabetes mellitus without complication (HCC)    NIDD  . H/O angina pectoris   . H/O blood clots   . Hypercholesteremia   . Hypertension   . Left rotator cuff tear   . Myocardial infarction ([place])   . Obesity   . Renal insufficiency   . Stroke ([place])   . Vertigo, aural      SURGICAL HISTORY   Past Surgical History:  Procedure Laterality Date  . A/V FISTULAGRAM Left 03/14/2017   Procedure: A/V Fistulagram;  Surgeon: Katha Cabal, MD;  Location: [facility];  Service: Cardiovascular;  Laterality: Left;  . AV FISTULA PLACEMENT Left 11-04-2015  . AV FISTULA PLACEMENT Left 11/04/2015   Procedure: ARTERIOVENOUS (AV) FISTULA CREATION  ( BRACHIAL  CEPHALIC );  Surgeon: Katha Cabal, MD;  Location: [facility];  Service: Vascular;  Laterality: Left;  . CARDIAC CATHETERIZATION    . CARDIAC CATHETERIZATION Left 07/19/2016   Procedure: Left Heart Cath and Coronary Angiography;  Surgeon: Corey Skains, MD;  Location: [facility];  Service: Cardiovascular;  Laterality: Left;  . CORONARY ARTERY BYPASS GRAFT  2012  . CORONARY STENT PLACEMENT    . JOINT REPLACEMENT    . KNEE SURGERY Bilateral   . LEFT HEART CATH AND CORONARY ANGIOGRAPHY N/A 12/25/2017   Procedure: LEFT HEART CATH AND CORONARY ANGIOGRAPHY;  Surgeon: Teodoro Spray, MD;  Location: [facility];  Service: Cardiovascular;  Laterality: N/A;  . LEFT HEART CATH AND CORONARY ANGIOGRAPHY Right 03/23/2018   Procedure: LEFT HEART CATH AND CORONARY ANGIOGRAPHY;  Surgeon: Dionisio David, MD;  Location: [facility];  Service: Cardiovascular;  Laterality: Right;  . LEFT HEART CATH AND CORONARY ANGIOGRAPHY N/A 05/30/2018   Procedure: LEFT HEART CATH AND CORONARY ANGIOGRAPHY;  Surgeon: Corey Skains, MD;  Location: [facility];  Service: Cardiovascular;  Laterality: N/A;  . PERIPHERAL VASCULAR CATHETERIZATION N/A 11/13/2015   Procedure: Dialysis/Perma Catheter Insertion;  Surgeon: Katha Cabal, MD;  Location: [facility];  Service: Cardiovascular;  Laterality: N/A;  . PERIPHERAL VASCULAR CATHETERIZATION N/A 12/04/2015   Procedure: Dialysis/Perma Catheter Insertion;  Surgeon: Katha Cabal, MD;  Location: [facility];  Service: Cardiovascular;  Laterality: N/A;  . PERIPHERAL VASCULAR CATHETERIZATION Left 12/31/2015   Procedure: A/V Shuntogram/Fistulagram;  Surgeon: Erskine Squibb  Lucky Cowboy, MD;  Location: [facility];  Service: Cardiovascular;  Laterality: Left;  . PERIPHERAL VASCULAR CATHETERIZATION N/A 12/31/2015   Procedure: A/V Shunt Intervention;  Surgeon: Algernon Huxley, MD;  Location: [facility];  Service: Cardiovascular;   Laterality: N/A;  . PERIPHERAL VASCULAR CATHETERIZATION Left 02/25/2016   Procedure: A/V Shuntogram/Fistulagram;  Surgeon: Algernon Huxley, MD;  Location: [facility];  Service: Cardiovascular;  Laterality: Left;  . PERIPHERAL VASCULAR CATHETERIZATION N/A 03/18/2016   Procedure: Dialysis/Perma Catheter Removal;  Surgeon: Katha Cabal, MD;  Location: [facility];  Service: Cardiovascular;  Laterality: N/A;  . REMOVAL OF A DIALYSIS CATHETER N/A 06/30/2017   Procedure: REMOVAL OF A DIALYSIS CATHETER;  Surgeon: Katha Cabal, MD;  Location: [facility];  Service: Vascular;  Laterality: N/A;  . REPLACEMENT TOTAL KNEE BILATERAL Bilateral      FAMILY HISTORY   Family History  Problem Relation Age of Onset  . Cancer Mother   . Cancer Father   . Diabetes Brother   . Heart disease Brother      SOCIAL HISTORY   Social History   Tobacco Use  . Smoking status: Current Every Day Smoker    Packs/day: 0.25    Types: Cigarettes  . Smokeless tobacco: Never Used  Substance Use Topics  . Alcohol use: No    Alcohol/week: 0.0 standard drinks  . Drug use: No     MEDICATIONS   Current Medication:  Current Facility-Administered Medications:  .  0.9 %  sodium chloride infusion, 250 mL, Intravenous, PRN, Flora Lipps, MD, Last Rate: 10 mL/hr at 08/15/19 2144, 250 mL at 08/15/19 2144 .  0.9 %  sodium chloride infusion, 250 mL, Intravenous, Continuous, Ladislav Caselli, MD .  acetaminophen (TYLENOL) tablet 650 mg, 650 mg, Oral, Q4H PRN, Flora Lipps, MD, 650 mg at 08/19/19 0752 .  chlorhexidine gluconate (MEDLINE KIT) (PERIDEX) 0.12 % solution 15 mL, 15 mL, Mouth Rinse, BID, Kasa, Kurian, MD, 15 mL at 08/19/19 0750 .  Chlorhexidine Gluconate Cloth 2 % PADS 6 each, 6 each, Topical, Q0600, Flora Lipps, MD, 6 each at 08/19/19 0535 .  dexmedetomidine (PRECEDEX) 400 MCG/100ML (4 mcg/mL) infusion, 0.4-1.2 mcg/kg/hr, Intravenous, Titrated, Tukov-Yual, Magdalene S, NP, Stopped at  08/19/19 0508 .  epoetin alfa (EPOGEN) injection 10,000 Units, 10,000 Units, Intravenous, Q T,Th,Sa-HD, Holley Raring, [place], MD, 10,000 Units at 08/17/19 1937 .  feeding supplement (PRO-STAT SUGAR FREE 64) liquid 30 mL, 30 mL, Per Tube, TID, Tyler Pita, MD, 30 mL at 08/19/19 1124 .  feeding supplement (VITAL HIGH PROTEIN) liquid 1,000 mL, 1,000 mL, Per Tube, Q24H, Tyler Pita, MD, 1,000 mL at 08/19/19 1124 .  fentaNYL (SUBLIMAZE) bolus via infusion 25 mcg, 25 mcg, Intravenous, Q15 min PRN, Flora Lipps, MD, 25 mcg at 08/15/19 0625 .  fentaNYL (SUBLIMAZE) injection 50 mcg, 50 mcg, Intravenous, Q15 min PRN, Blake Divine, MD .  fentaNYL (SUBLIMAZE) injection 50 mcg, 50 mcg, Intravenous, Q2H PRN, Blake Divine, MD .  fentaNYL 2556mg in NS 2576m(1089mml) infusion-PREMIX, 25-100 mcg/hr, Intravenous, Continuous, Tukov-Yual, Magdalene S, NP, Stopped at 08/17/19 0940 .  heparin injection 5,000 Units, 5,000 Units, Subcutaneous, Q12H, GonTyler PitaD, 5,000 Units at 08/19/19 1124 .  ipratropium-albuterol (DUONEB) 0.5-2.5 (3) MG/3ML nebulizer solution 3 mL, 3 mL, Nebulization, Q6H, GonTyler PitaD, 3 mL at 08/19/19 0805 .  MEDLINE mouth rinse, 15 mL, Mouth Rinse, 10 times per day, KasFlora LippsD, 15 mL at 08/19/19 1125 .  metoprolol tartrate (LOPRESSOR) tablet 25 mg, 25 mg, Per Tube, BID, Tyler Pita, MD, 25 mg at 08/19/19 1125 .  midazolam (VERSED) injection 1 mg, 1 mg, Intravenous, Q15 min PRN, Flora Lipps, MD, 1 mg at 08/17/19 2056 .  midazolam (VERSED) injection 1 mg, 1 mg, Intravenous, Q2H PRN, Flora Lipps, MD, 1 mg at 08/16/19 2006 .  ondansetron (ZOFRAN) injection 4 mg, 4 mg, Intravenous, Q6H PRN, Mortimer Fries, Kurian, MD .  pantoprazole (PROTONIX) injection 40 mg, 40 mg, Intravenous, BID, Tyler Pita, MD, 40 mg at 08/19/19 1124 .  phenylephrine (NEO-SYNEPHRINE) 10 mg in sodium chloride 0.9 % 250 mL (0.04 mg/mL) infusion, 0-100 mcg/min, Intravenous, Titrated,  Tukov-Yual, Magdalene S, NP, Last Rate: 30 mL/hr at 08/19/19 0536, 20 mcg/min at 08/19/19 0536 .  sodium chloride flush (NS) 0.9 % injection 3 mL, 3 mL, Intravenous, Q12H, Kasa, Kurian, MD, 3 mL at 08/19/19 1124 .  sodium chloride flush (NS) 0.9 % injection 3 mL, 3 mL, Intravenous, PRN, Flora Lipps, MD .  Derrill Memo ON 08/20/2019] vancomycin (VANCOCIN) IVPB 1000 mg/200 mL premix, 1,000 mg, Intravenous, Q T,Th,Sa-HD, Nazari, Walid A, RPH    ALLERGIES   Nystatin, Sulfa antibiotics, and Niaspan [niacin]    REVIEW OF SYSTEMS     Unable to obtain while critically ill on mechanical ventilation  PHYSICAL EXAMINATION   Vital Signs: Temp:  [98.5 F (36.9 C)-99 F (37.2 C)] 98.5 F (36.9 C) (11/30 0430) Pulse Rate:  [81-98] 98 (11/30 0530) Resp:  [19-27] 23 (11/30 0530) BP: (74-105)/(40-60) 82/48 (11/30 0530) SpO2:  [93 %-100 %] 98 % (11/30 0800) FiO2 (%):  [28 %] 28 % (11/30 0800) Weight:  [95.6 kg] 95.6 kg (11/30 0530)  GENERAL:Sedated on MV HEAD: Normocephalic, atraumatic.  EYES: Pupils equal, round, reactive to light.  No scleral icterus.  MOUTH: Moist mucosal membrane. NECK: Supple. No thyromegaly. No nodules. No JVD.  PULMONARY: MV breath sounds without rhonchi CARDIOVASCULAR: S1 and S2. Regular rate and rhythm. No murmurs, rubs, or gallops.  GASTROINTESTINAL: Soft, nontender, non-distended. No masses. Positive bowel sounds. No hepatosplenomegaly.  MUSCULOSKELETAL: No swelling, clubbing, or edema.  NEUROLOGIC: sedated on MV SKIN:intact,warm,dry   PERTINENT DATA     Infusions: . sodium chloride 250 mL (08/15/19 2144)  . sodium chloride    . dexmedetomidine (PRECEDEX) IV infusion Stopped (08/19/19 0508)  . fentaNYL infusion INTRAVENOUS Stopped (08/17/19 0940)  . phenylephrine (NEO-SYNEPHRINE) Adult infusion 20 mcg/min (08/19/19 0536)  . [START ON 08/20/2019] vancomycin     Scheduled Medications: . chlorhexidine gluconate (MEDLINE KIT)  15 mL Mouth Rinse BID  .  Chlorhexidine Gluconate Cloth  6 each Topical Q0600  . epoetin (EPOGEN/PROCRIT) injection  10,000 Units Intravenous Q T,Th,Sa-HD  . feeding supplement (PRO-STAT SUGAR FREE 64)  30 mL Per Tube TID  . feeding supplement (VITAL HIGH PROTEIN)  1,000 mL Per Tube Q24H  . heparin injection (subcutaneous)  5,000 Units Subcutaneous Q12H  . ipratropium-albuterol  3 mL Nebulization Q6H  . mouth rinse  15 mL Mouth Rinse 10 times per day  . metoprolol tartrate  25 mg Per Tube BID  . pantoprazole (PROTONIX) IV  40 mg Intravenous BID  . sodium chloride flush  3 mL Intravenous Q12H   PRN Medications: sodium chloride, acetaminophen, fentaNYL, fentaNYL (SUBLIMAZE) injection, fentaNYL (SUBLIMAZE) injection, midazolam, midazolam, ondansetron (ZOFRAN) IV, sodium chloride flush Hemodynamic parameters:   Intake/Output: 11/29 0701 - 11/30 0700 In: 890.6 [I.V.:236; NG/GT:654.7] Out: 50 [Emesis/NG output:50]  Ventilator  Settings: Vent Mode: PRVC FiO2 (%):  [  28 %] 28 % Set Rate:  [23 bmp] 23 bmp Vt Set:  [530 mL] 530 mL PEEP:  [5 cmH20] 5 cmH20 Pressure Support:  [5 cmH20] 5 cmH20 Plateau Pressure:  [18 JEH63-14 cmH20] 20 cmH20    LAB RESULTS:  Basic Metabolic Panel: Recent Labs  Lab 08/14/19 0002  08/15/19 0620  08/16/19 0518 08/17/19 0504 08/17/19 1312 08/18/19 0454 08/19/19 0552  NA 138  --  137  --  137 136  --  138 139  K 5.0  --  3.8  --  3.1* 3.7  --  4.0 4.5  CL 102  --  96*  --  94* 93*  --  97* 96*  CO2 18*  --  22  --  25 23  --  23 23  GLUCOSE 115*  --  98  --  103* 99  --  102* 157*  BUN 57*  --  32*  --  25* 56*  --  35* 78*  CREATININE 10.24*  --  6.32*  --  4.09* 6.26*  --  4.09* 6.31*  CALCIUM 7.8*  --  7.9*  --  8.5* 8.6*  --  8.5* 8.7*  MG 1.6*  --  1.9  --   --  2.0  --   --  2.1  PHOS 7.4*   < > 6.5*   < > 5.1* 7.2* 8.9* 5.4* 5.7*   < > = values in this interval not displayed.   Liver Function Tests: Recent Labs  Lab 08/16/19 0518 08/17/19 0504 08/18/19 0454   AST  --  108*  --   ALT  --  60*  --   ALKPHOS  --  73  --   BILITOT  --  1.0  --   PROT  --  7.4  --   ALBUMIN 3.3* 3.2* 3.3*   No results for input(s): LIPASE, AMYLASE in the last 168 hours. No results for input(s): AMMONIA in the last 168 hours. CBC: Recent Labs  Lab 08/14/19 0002 08/15/19 0620 08/16/19 0518 08/18/19 0454 08/19/19 0552  WBC 8.8 8.8 10.6* 15.6* 16.2*  NEUTROABS  --  6.9  --  12.4*  --   HGB 8.2* 7.6* 7.8* 8.6* 9.3*  HCT 24.7* 24.3* 24.9* 27.7* 29.2*  MCV 96.5 102.1* 100.4* 103.0* 100.0  PLT 195 209 196 180 168   Cardiac Enzymes: No results for input(s): CKTOTAL, CKMB, CKMBINDEX, TROPONINI in the last 168 hours. BNP: Invalid input(s): POCBNP CBG: Recent Labs  Lab 08/18/19 1933 08/18/19 2325 08/19/19 0358 08/19/19 0713 08/19/19 1110  GLUCAP 127* 136* 124* 135* 135*     IMAGING RESULTS:  Imaging: Dg Chest Port 1 View  Result Date: 08/19/2019 CLINICAL DATA:  Respiratory failure. EXAM: PORTABLE CHEST 1 VIEW COMPARISON:  08/18/2019.  08/17/2019. FINDINGS: Endotracheal tube and NG tube in stable position. Stable cardiomegaly. No pulmonary venous congestion. Persistent but improved right lung infiltrate with residual infiltrate right lung base. Persistent bibasilar atelectasis. No pleural effusion or pneumothorax. IMPRESSION: 1. Lines and tubes stable position. 2. Stable cardiomegaly. No pulmonary venous congestion. 2. Persistent but improved right lung infiltrate with residual infiltrate in the right lung base. Persistent bibasilar atelectasis. Electronically Signed   By: Marcello Moores  Register   On: 08/19/2019 06:06   Dg Chest Port 1 View  Result Date: 08/18/2019 CLINICAL DATA:  Acute respiratory failure EXAM: PORTABLE CHEST 1 VIEW COMPARISON:  Chest x-rays dated 08/17/2019 and 08/16/2019. FINDINGS: Increased opacity at the RIGHT lung base. Probable mild  atelectasis at the LEFT lung base, stable. Stable cardiomegaly. Endotracheal tube is well positioned with  tip approximately 4 cm above the carina. Enteric tube passes below the diaphragm. IMPRESSION: 1. Increased opacity at the RIGHT lung base, pneumonia versus asymmetric pulmonary edema. 2. Stable cardiomegaly. 3. Support apparatus appears appropriately positioned. Electronically Signed   By: Franki Cabot M.D.   On: 08/18/2019 07:21      ASSESSMENT AND PLAN    -Multidisciplinary rounds held today  Acute Hypoxic Respiratory Failure -etiology is multifactorial including  -MRSA pneumonia, acute on chronic HFrEF, bibasilar atelectasis, fluid overload due to ESRD.  -continue Vancomycin per pharmD -treat atelectasis with MetaNEB while on ventilator and recruitment maneuvers via ventilator -ESRD will start CRRT   Acute on chronic exacerbation of systolic CHF with EF 73-41% - diuresis has been difficult with severe renal failure -will perform CRRT ICU monitoring  ESRD -s/p evaluation by nephrology - patient not able to tolerate HD and requires CRRT as per renal team -Epogen 10K ongoing  -follow chem 7 -follow UO -continue Foley Catheter-assess need daily      Septic shock  - due to pneumonia with MRSA  -use vasopressors to keep MAP>65-currently on Neosynephrine -follow ABG and LA -Vancomycin IV per pharmacy -consider stress dose steroids-Solucortef -100BID   ID -continue IV abx as prescibed -follow up cultures  GI/Nutrition GI PROPHYLAXIS as indicated-protonix 40 IV DIET-->TF's as tolerated Constipation protocol as indicated  ENDO - ICU hypoglycemic\Hyperglycemia protocol -check FSBS per protocol   ELECTROLYTES -follow labs as needed -replace as needed -pharmacy consultation   DVT/GI PRX ordered -heparin 5K q12h TRANSFUSIONS AS NEEDED MONITOR FSBS ASSESS the need for LABS as needed   Critical care provider statement:    Critical care time (minutes):  33   Critical care time was exclusive of:  Separately billable procedures and treating other patients    Critical care was necessary to treat or prevent imminent or life-threatening deterioration of the following conditions:  Septic shock, MRSA pneumonia, ESRD, acute hypoxemic respiratory failure, multiple comorbid conditions   Critical care was time spent personally by me on the following activities:  Development of treatment plan with patient or surrogate, discussions with consultants, evaluation of patient's response to treatment, examination of patient, obtaining history from patient or surrogate, ordering and performing treatments and interventions, ordering and review of laboratory studies and re-evaluation of patient's condition.  I assumed direction of critical care for this patient from another provider in my specialty: no    This document was prepared using Dragon voice recognition software and may include unintentional dictation errors.    Ottie Glazier, M.D.  Division of [place]

## 2019-08-19 NOTE — Progress Notes (Signed)
[facility]  ROUNDING NOTE   Subjective:   Tmax 102.2 Phenylephrine gtt. Off dopamine this morning.   Objective:  Vital signs in last 24 hours:  Temp:  [98.5 F (36.9 C)-102.2 F (39 C)] 102.2 F (39 C) (11/30 0800) Pulse Rate:  [42-98] 42 (11/30 1300) Resp:  [19-27] 23 (11/30 1300) BP: (74-105)/(40-60) 96/53 (11/30 1300) SpO2:  [93 %-100 %] 99 % (11/30 1300) FiO2 (%):  [28 %] 28 % (11/30 0800) Weight:  [95.6 kg] 95.6 kg (11/30 0530)  Weight change: -2.6 kg Filed Weights   08/17/19 0500 08/18/19 0500 08/19/19 0530  Weight: 97.7 kg 98.2 kg 95.6 kg    Intake/Output: I/O last 3 completed shifts: In: 1487.4 [I.V.:332.9; NG/GT:654.7; IV Piggyback:499.8] Out: 1620 [Emesis/NG output:50; Other:1570]   Intake/Output this shift:  Total I/O In: 542.7 [I.V.:542.7] Out: -   Physical Exam: General: Critically ill  Head: ETT   Eyes:   PERRL  Neck:   trachea midline  Lungs:  PRVC FiO2 28%  Heart: Regular rate and rhythm  Abdomen:  Soft,   obese  Extremities:  + peripheral edema.  Neurologic: Intubated and sedated  Skin: No lesions  Access: Left AVF    Basic Metabolic Panel: Recent Labs  Lab 08/14/19 0002  08/15/19 0620  08/16/19 0518 08/17/19 0504 08/17/19 1312 08/18/19 0454 08/19/19 0552  NA 138  --  137  --  137 136  --  138 139  K 5.0  --  3.8  --  3.1* 3.7  --  4.0 4.5  CL 102  --  96*  --  94* 93*  --  97* 96*  CO2 18*  --  22  --  25 23  --  23 23  GLUCOSE 115*  --  98  --  103* 99  --  102* 157*  BUN 57*  --  32*  --  25* 56*  --  35* 78*  CREATININE 10.24*  --  6.32*  --  4.09* 6.26*  --  4.09* 6.31*  CALCIUM 7.8*  --  7.9*  --  8.5* 8.6*  --  8.5* 8.7*  MG 1.6*  --  1.9  --   --  2.0  --   --  2.1  PHOS 7.4*   < > 6.5*   < > 5.1* 7.2* 8.9* 5.4* 5.7*   < > = values in this interval not displayed.    Liver Function Tests: Recent Labs  Lab 08/16/19 0518 08/17/19 0504 08/18/19 0454  AST  --  108*  --   ALT  --  60*  --   ALKPHOS  --   73  --   BILITOT  --  1.0  --   PROT  --  7.4  --   ALBUMIN 3.3* 3.2* 3.3*   No results for input(s): LIPASE, AMYLASE in the last 168 hours. No results for input(s): AMMONIA in the last 168 hours.  CBC: Recent Labs  Lab 08/14/19 0002 08/15/19 0620 08/16/19 0518 08/18/19 0454 08/19/19 0552  WBC 8.8 8.8 10.6* 15.6* 16.2*  NEUTROABS  --  6.9  --  12.4*  --   HGB 8.2* 7.6* 7.8* 8.6* 9.3*  HCT 24.7* 24.3* 24.9* 27.7* 29.2*  MCV 96.5 102.1* 100.4* 103.0* 100.0  PLT 195 209 196 180 168    Cardiac Enzymes: No results for input(s): CKTOTAL, CKMB, CKMBINDEX, TROPONINI in the last 168 hours.  BNP: Invalid input(s): POCBNP  CBG: Recent Labs  Lab 08/18/19  1933 08/18/19 2325 08/19/19 0358 08/19/19 0713 08/19/19 1110  GLUCAP 127* 136* 124* 135* 135*    Microbiology: Results for orders placed or performed during the hospital encounter of 08/08/2019  SARS Coronavirus 2 by RT PCR (hospital order, performed in [facility] hospital lab) Nasopharyngeal Nasopharyngeal Swab     Status: None   Collection Time: 08/02/2019 12:52 PM   Specimen: Nasopharyngeal Swab  Result Value Ref Range Status   SARS Coronavirus 2 NEGATIVE NEGATIVE Final    Comment: (NOTE) SARS-CoV-2 target nucleic acids are NOT DETECTED. The SARS-CoV-2 RNA is generally detectable in upper and lower respiratory specimens during the acute phase of infection. The lowest concentration of SARS-CoV-2 viral copies this assay can detect is 250 copies / mL. A negative result does not preclude SARS-CoV-2 infection and should not be used as the sole basis for treatment or other patient management decisions.  A negative result may occur with improper specimen collection / handling, submission of specimen other than nasopharyngeal swab, presence of viral mutation(s) within the areas targeted by this assay, and inadequate number of viral copies (<250 copies / mL). A negative result must be combined with clinical observations,  patient history, and epidemiological information. Fact Sheet for Patients:   StrictlyIdeas.no Fact Sheet for Healthcare Providers: BankingDealers.co.za This test is not yet approved or cleared  by the [place] FDA and has been authorized for detection and/or diagnosis of SARS-CoV-2 by FDA under an Emergency Use Authorization (EUA).  This EUA will remain in effect (meaning this test can be used) for the duration of the COVID-19 declaration under Section 564(b)(1) of the Act, 21 U.S.C. section 360bbb-3(b)(1), unless the authorization is terminated or revoked sooner. Performed at [facility], [address] [place], [place] [address]   Urine culture     Status: Abnormal   Collection Time: 07/25/2019  1:38 PM   Specimen: Urine, Random  Result Value Ref Range Status   Specimen Description   Final    URINE, RANDOM Performed at [facility], [address] [place], [place] [address]    Special Requests   Final    NONE Performed at [facility], [place], [place] [address]    Culture 20,000 COLONIES/mL STAPHYLOCOCCUS AUREUS (A)  Final   Report Status 08/15/2019 FINAL  Final   Organism ID, Bacteria STAPHYLOCOCCUS AUREUS (A)  Final      Susceptibility   Staphylococcus aureus - MIC*    CIPROFLOXACIN <=0.5 SENSITIVE Sensitive     GENTAMICIN <=0.5 SENSITIVE Sensitive     NITROFURANTOIN <=16 SENSITIVE Sensitive     OXACILLIN 1 SENSITIVE Sensitive     TETRACYCLINE <=1 SENSITIVE Sensitive     VANCOMYCIN <=0.5 SENSITIVE Sensitive     TRIMETH/SULFA <=10 SENSITIVE Sensitive     CLINDAMYCIN <=0.25 SENSITIVE Sensitive     RIFAMPIN <=0.5 SENSITIVE Sensitive     Inducible Clindamycin NEGATIVE Sensitive     * 20,000 COLONIES/mL STAPHYLOCOCCUS AUREUS  MRSA PCR Screening     Status: None   Collection Time: 07/29/2019 11:05 PM   Specimen: Nasal Mucosa; Nasopharyngeal  Result Value Ref Range Status    MRSA by PCR NEGATIVE NEGATIVE Final    Comment:        The GeneXpert MRSA Assay (FDA approved for NASAL specimens only), is one component of a comprehensive MRSA colonization surveillance program. It is not intended to diagnose MRSA infection nor to guide or monitor treatment for MRSA infections. Performed at [facility], [phone]  [address] [place], [place] [address]   Culture, respiratory (non-expectorated)     Status: None   Collection Time: 08/16/19  3:36 PM   Specimen: Tracheal Aspirate; Respiratory  Result Value Ref Range Status   Specimen Description   Final    TRACHEAL ASPIRATE Performed at [facility], [address] [place], [place] [address]    Special Requests   Final    NONE Performed at [facility], [place], [place] [address]    Gram Stain   Final    RARE WBC PRESENT, PREDOMINANTLY PMN MODERATE GRAM POSITIVE COCCI RARE GRAM POSITIVE RODS Performed at [facility], [place] [address], [place], [place] [address]    Culture   Final    MODERATE METHICILLIN RESISTANT STAPHYLOCOCCUS AUREUS   Report Status 08/19/2019 FINAL  Final   Organism ID, Bacteria METHICILLIN RESISTANT STAPHYLOCOCCUS AUREUS  Final      Susceptibility   Methicillin resistant staphylococcus aureus - MIC*    CIPROFLOXACIN <=0.5 SENSITIVE Sensitive     ERYTHROMYCIN >=8 RESISTANT Resistant     GENTAMICIN <=0.5 SENSITIVE Sensitive     OXACILLIN >=4 RESISTANT Resistant     TETRACYCLINE <=1 SENSITIVE Sensitive     VANCOMYCIN 1 SENSITIVE Sensitive     TRIMETH/SULFA <=10 SENSITIVE Sensitive     CLINDAMYCIN RESISTANT Resistant     RIFAMPIN <=0.5 SENSITIVE Sensitive     Inducible Clindamycin POSITIVE Resistant     * MODERATE METHICILLIN RESISTANT STAPHYLOCOCCUS AUREUS  CULTURE, BLOOD (ROUTINE X 2) w Reflex to ID Panel     Status: None (Preliminary result)   Collection Time: 08/16/19  4:01 PM   Specimen: BLOOD  Result Value Ref Range Status   Specimen  Description BLOOD BLOOD RIGHT HAND  Final   Special Requests   Final    BOTTLES DRAWN AEROBIC AND ANAEROBIC Blood Culture adequate volume   Culture   Final    NO GROWTH 3 DAYS Performed at [facility], [address] [place], [place] [address]    Report Status PENDING  Incomplete  CULTURE, BLOOD (ROUTINE X 2) w Reflex to ID Panel     Status: None (Preliminary result)   Collection Time: 08/16/19  4:09 PM   Specimen: BLOOD  Result Value Ref Range Status   Specimen Description BLOOD BLOOD RIGHT WRIST  Final   Special Requests   Final    BOTTLES DRAWN AEROBIC ONLY Blood Culture results may not be optimal due to an inadequate volume of blood received in culture bottles   Culture   Final    NO GROWTH 3 DAYS Performed at [facility], [address], [place], [place] [address]    Report Status PENDING  Incomplete    Coagulation Studies: Recent Labs    08/19/19 1021  LABPROT 16.3*  INR 1.3*    Urinalysis: No results for input(s): COLORURINE, LABSPEC, PHURINE, GLUCOSEU, HGBUR, BILIRUBINUR, KETONESUR, PROTEINUR, UROBILINOGEN, NITRITE, LEUKOCYTESUR in the last 72 hours.  Invalid input(s): APPERANCEUR    Imaging: Dg Chest Port 1 View  Result Date: 08/19/2019 CLINICAL DATA:  Respiratory failure. EXAM: PORTABLE CHEST 1 VIEW COMPARISON:  08/18/2019.  08/17/2019. FINDINGS: Endotracheal tube and NG tube in stable position. Stable cardiomegaly. No pulmonary venous congestion. Persistent but improved right lung infiltrate with residual infiltrate right lung base. Persistent bibasilar atelectasis. No pleural effusion or pneumothorax. IMPRESSION: 1. Lines and tubes stable position. 2. Stable cardiomegaly. No pulmonary venous congestion. 2. Persistent but improved right lung infiltrate  with residual infiltrate in the right lung base. Persistent bibasilar atelectasis. Electronically Signed   By: Marcello Moores  Register   On: 08/19/2019 06:06   Dg Chest Port 1 View  Result Date:  08/18/2019 CLINICAL DATA:  Acute respiratory failure EXAM: PORTABLE CHEST 1 VIEW COMPARISON:  Chest x-rays dated 08/17/2019 and 08/16/2019. FINDINGS: Increased opacity at the RIGHT lung base. Probable mild atelectasis at the LEFT lung base, stable. Stable cardiomegaly. Endotracheal tube is well positioned with tip approximately 4 cm above the carina. Enteric tube passes below the diaphragm. IMPRESSION: 1. Increased opacity at the RIGHT lung base, pneumonia versus asymmetric pulmonary edema. 2. Stable cardiomegaly. 3. Support apparatus appears appropriately positioned. Electronically Signed   By: Franki Cabot M.D.   On: 08/18/2019 07:21     Medications:   . sodium chloride 250 mL (08/15/19 2144)  . sodium chloride    . dexmedetomidine (PRECEDEX) IV infusion Stopped (08/19/19 1132)  . phenylephrine (NEO-SYNEPHRINE) Adult infusion 60 mcg/min (08/19/19 1200)  . [START ON 08/20/2019] vancomycin     . chlorhexidine gluconate (MEDLINE KIT)  15 mL Mouth Rinse BID  . Chlorhexidine Gluconate Cloth  6 each Topical Q0600  . epoetin (EPOGEN/PROCRIT) injection  10,000 Units Intravenous Q T,Th,Sa-HD  . feeding supplement (PRO-STAT SUGAR FREE 64)  30 mL Per Tube TID  . feeding supplement (VITAL HIGH PROTEIN)  1,000 mL Per Tube Q24H  . heparin injection (subcutaneous)  5,000 Units Subcutaneous Q12H  . hydrocortisone sod succinate (SOLU-CORTEF) inj  100 mg Intravenous Q12H  . mouth rinse  15 mL Mouth Rinse 10 times per day  . pantoprazole (PROTONIX) IV  40 mg Intravenous BID  . sodium chloride flush  3 mL Intravenous Q12H   sodium chloride, acetaminophen, sodium chloride flush  Assessment/ Plan:  Kimberly Shaffer is a 68 y.o. black female with stage renal disease on hemodialysis, coronary artery disease status post CABG, peripheral arterial disease, COPD, hypertension, hyperlipidemia, diabetes type 2, CVA, history of anxiety and depression, obstructive sleep apnea admitted to [facility] on 08/06/2019 for  Shortness of breath [R06.02] Acute pulmonary edema ([place]) [J81.0] SOB (shortness of breath) [R06.02] ESRD on dialysis ([place]) [N18.6, Z99.2] Encounter for imaging study to confirm orogastric (OG) tube placement [Z01.89] Urinary tract infection without hematuria, site unspecified [N39.0]  [place] Left AVF  1. End Stage Renal Disease: hemodynamically unstable for intermittent hemodialysis treatment.  Will plan on continous renal replacement therapy. Patient will need a dialysis catheter for this.   2. Acute respiratory failure requiring mechanical ventilation. With MRSA pneumonia - Vancomycin.  - Appreciate pulmonary/critical care input.   3. Anemia of chronic kidney disease: macrocytic. Hemoglobin 9.3. EPO ordered.   4. Hypotension with sepsis  Requiring phenylephrine and dopamine gtt   LOS: 6 Beckem Tomberlin 11/30/20201:49 PM

## 2019-08-20 LAB — RENAL FUNCTION PANEL
Albumin: 2.6 g/dL — ABNORMAL LOW (ref 3.5–5.0)
Albumin: 2.8 g/dL — ABNORMAL LOW (ref 3.5–5.0)
Albumin: 2.8 g/dL — ABNORMAL LOW (ref 3.5–5.0)
Albumin: 2.7 g/dL — ABNORMAL LOW (ref 3.5–5.0)
Albumin: 2.5 g/dL — ABNORMAL LOW (ref 3.5–5.0)
Albumin: 2.7 g/dL — ABNORMAL LOW (ref 3.5–5.0)
Anion gap: 20 — ABNORMAL HIGH (ref 5–15)
Anion gap: 17 — ABNORMAL HIGH (ref 5–15)
Anion gap: 17 — ABNORMAL HIGH (ref 5–15)
Anion gap: 15 (ref 5–15)
Anion gap: 14 (ref 5–15)
Anion gap: 14 (ref 5–15)
BUN: 109 mg/dL — ABNORMAL HIGH (ref 8–23)
BUN: 102 mg/dL — ABNORMAL HIGH (ref 8–23)
BUN: 88 mg/dL — ABNORMAL HIGH (ref 8–23)
BUN: 85 mg/dL — ABNORMAL HIGH (ref 8–23)
BUN: 73 mg/dL — ABNORMAL HIGH (ref 8–23)
BUN: 68 mg/dL — ABNORMAL HIGH (ref 8–23)
CO2: 21 mmol/L — ABNORMAL LOW (ref 22–32)
CO2: 22 mmol/L (ref 22–32)
CO2: 21 mmol/L — ABNORMAL LOW (ref 22–32)
CO2: 21 mmol/L — ABNORMAL LOW (ref 22–32)
CO2: 23 mmol/L (ref 22–32)
CO2: 23 mmol/L (ref 22–32)
Calcium: 8.4 mg/dL — ABNORMAL LOW (ref 8.9–10.3)
Calcium: 8.6 mg/dL — ABNORMAL LOW (ref 8.9–10.3)
Calcium: 8.9 mg/dL (ref 8.9–10.3)
Calcium: 8.6 mg/dL — ABNORMAL LOW (ref 8.9–10.3)
Calcium: 8.8 mg/dL — ABNORMAL LOW (ref 8.9–10.3)
Calcium: 9.1 mg/dL (ref 8.9–10.3)
Chloride: 97 mmol/L — ABNORMAL LOW (ref 98–111)
Chloride: 97 mmol/L — ABNORMAL LOW (ref 98–111)
Chloride: 99 mmol/L (ref 98–111)
Chloride: 101 mmol/L (ref 98–111)
Chloride: 98 mmol/L (ref 98–111)
Chloride: 100 mmol/L (ref 98–111)
Creatinine, Ser: 8.01 mg/dL — ABNORMAL HIGH (ref 0.44–1.00)
Creatinine, Ser: 7.1 mg/dL — ABNORMAL HIGH (ref 0.44–1.00)
Creatinine, Ser: 5.82 mg/dL — ABNORMAL HIGH (ref 0.44–1.00)
Creatinine, Ser: 5.5 mg/dL — ABNORMAL HIGH (ref 0.44–1.00)
Creatinine, Ser: 4.4 mg/dL — ABNORMAL HIGH (ref 0.44–1.00)
Creatinine, Ser: 3.75 mg/dL — ABNORMAL HIGH (ref 0.44–1.00)
GFR calc Af Amer: 5 mL/min — ABNORMAL LOW (ref >60)
GFR calc Af Amer: 6 mL/min — ABNORMAL LOW (ref >60)
GFR calc Af Amer: 8 mL/min — ABNORMAL LOW (ref >60)
GFR calc Af Amer: 9 mL/min — ABNORMAL LOW (ref >60)
GFR calc Af Amer: 11 mL/min — ABNORMAL LOW (ref >60)
GFR calc Af Amer: 14 mL/min — ABNORMAL LOW (ref >60)
GFR calc non Af Amer: 5 mL/min — ABNORMAL LOW (ref >60)
GFR calc non Af Amer: 5 mL/min — ABNORMAL LOW (ref >60)
GFR calc non Af Amer: 7 mL/min — ABNORMAL LOW (ref >60)
GFR calc non Af Amer: 7 mL/min — ABNORMAL LOW (ref >60)
GFR calc non Af Amer: 10 mL/min — ABNORMAL LOW (ref >60)
GFR calc non Af Amer: 12 mL/min — ABNORMAL LOW (ref >60)
Glucose, Bld: 172 mg/dL — ABNORMAL HIGH (ref 70–99)
Glucose, Bld: 176 mg/dL — ABNORMAL HIGH (ref 70–99)
Glucose, Bld: 169 mg/dL — ABNORMAL HIGH (ref 70–99)
Glucose, Bld: 173 mg/dL — ABNORMAL HIGH (ref 70–99)
Glucose, Bld: 153 mg/dL — ABNORMAL HIGH (ref 70–99)
Glucose, Bld: 178 mg/dL — ABNORMAL HIGH (ref 70–99)
Phosphorus: 5.8 mg/dL — ABNORMAL HIGH (ref 2.5–4.6)
Phosphorus: 5.5 mg/dL — ABNORMAL HIGH (ref 2.5–4.6)
Phosphorus: 4.7 mg/dL — ABNORMAL HIGH (ref 2.5–4.6)
Phosphorus: 5.1 mg/dL — ABNORMAL HIGH (ref 2.5–4.6)
Phosphorus: 4 mg/dL (ref 2.5–4.6)
Phosphorus: 3.5 mg/dL (ref 2.5–4.6)
Potassium: 5.3 mmol/L — ABNORMAL HIGH (ref 3.5–5.1)
Potassium: 4.8 mmol/L (ref 3.5–5.1)
Potassium: 4.5 mmol/L (ref 3.5–5.1)
Potassium: 4.2 mmol/L (ref 3.5–5.1)
Potassium: 4.2 mmol/L (ref 3.5–5.1)
Potassium: 4.1 mmol/L (ref 3.5–5.1)
Sodium: 138 mmol/L (ref 135–145)
Sodium: 136 mmol/L (ref 135–145)
Sodium: 137 mmol/L (ref 135–145)
Sodium: 137 mmol/L (ref 135–145)
Sodium: 135 mmol/L (ref 135–145)
Sodium: 137 mmol/L (ref 135–145)

## 2019-08-20 LAB — GLUCOSE, CAPILLARY
Glucose-Capillary: 166 mg/dL — ABNORMAL HIGH (ref 70–99)
Glucose-Capillary: 160 mg/dL — ABNORMAL HIGH (ref 70–99)
Glucose-Capillary: 152 mg/dL — ABNORMAL HIGH (ref 70–99)
Glucose-Capillary: 154 mg/dL — ABNORMAL HIGH (ref 70–99)
Glucose-Capillary: 144 mg/dL — ABNORMAL HIGH (ref 70–99)

## 2019-08-20 LAB — CBC WITH DIFFERENTIAL/PLATELET
Abs Immature Granulocytes: 0.6 10*3/uL — ABNORMAL HIGH (ref 0.00–0.07)
Basophils Absolute: 0.1 10*3/uL (ref 0.0–0.1)
Basophils Relative: 0 %
Eosinophils Absolute: 0 10*3/uL (ref 0.0–0.5)
Eosinophils Relative: 0 %
HCT: 26.6 % — ABNORMAL LOW (ref 36.0–46.0)
Hemoglobin: 8.6 g/dL — ABNORMAL LOW (ref 12.0–15.0)
Immature Granulocytes: 3 %
Lymphocytes Relative: 4 %
Lymphs Abs: 1 10*3/uL (ref 0.7–4.0)
MCH: 31.7 pg (ref 26.0–34.0)
MCHC: 32.3 g/dL (ref 30.0–36.0)
MCV: 98.2 fL (ref 80.0–100.0)
Monocytes Absolute: 1.7 10*3/uL — ABNORMAL HIGH (ref 0.1–1.0)
Monocytes Relative: 7 %
Neutro Abs: 20.2 10*3/uL — ABNORMAL HIGH (ref 1.7–7.7)
Neutrophils Relative %: 86 %
Platelets: 137 10*3/uL — ABNORMAL LOW (ref 150–400)
RBC: 2.71 MIL/uL — ABNORMAL LOW (ref 3.87–5.11)
RDW: 15.3 % (ref 11.5–15.5)
WBC: 23.5 10*3/uL — ABNORMAL HIGH (ref 4.0–10.5)
nRBC: 0.1 % (ref 0.0–0.2)

## 2019-08-20 LAB — MAGNESIUM: Magnesium: 2.1 mg/dL (ref 1.7–2.4)

## 2019-08-20 MED ORDER — FLUCONAZOLE IN SODIUM CHLORIDE 200-0.9 MG/100ML-% IV SOLN
200.0000 mg | INTRAVENOUS | Status: DC
  Administered 2019-08-20: 200 mg via INTRAVENOUS
  Filled 2019-08-20: qty 100

## 2019-08-20 MED ORDER — EPOETIN ALFA 10000 UNIT/ML IJ SOLN: 10000.0000 [IU] | INTRAMUSCULAR | Status: DC

## 2019-08-20 MED ORDER — CHLORHEXIDINE GLUCONATE 0.12 % MT SOLN
OROMUCOSAL | Status: AC
  Administered 2019-08-20: 20:00:00 15 mL via OROMUCOSAL
  Filled 2019-08-20: qty 15

## 2019-08-20 MED ORDER — SENNOSIDES 8.8 MG/5ML PO SYRP
5.0000 mL | ORAL_SOLUTION | Freq: Two times a day (BID) | ORAL | Status: DC
  Administered 2019-08-20: 5 mL
  Filled 2019-08-20 (×2): qty 5

## 2019-08-20 MED ORDER — DOCUSATE SODIUM 50 MG/5ML PO LIQD
100.0000 mg | Freq: Every day | ORAL | Status: DC
  Administered 2019-08-20: 100 mg
  Filled 2019-08-20: qty 10

## 2019-08-20 NOTE — Progress Notes (Signed)
CRRT restarted at this time.

## 2019-08-20 NOTE — Plan of Care (Signed)
PMT note: Called to speak with daughter listed as first contact Fonda, but VM box was full. Called Emiko and she is unable to talk at this time. Plans to talk tomorrow at 10:00.

## 2019-08-20 NOTE — Progress Notes (Signed)
[facility]  ROUNDING NOTE   Subjective:   Placed on CRRT overnight. Tolerated treatment well overnight. Seems UF was not documented correctly.  Tmax 100 Required phenylephrine overnight.   Weaning trial for today.   Objective:  Vital signs in last 24 hours:  Temp:  [98.9 F (37.2 C)-100 F (37.8 C)] 99.3 F (37.4 C) (12/01 0800) Pulse Rate:  [42-100] 81 (12/01 0500) Resp:  [20-29] 23 (12/01 0800) BP: (81-165)/(44-61) 165/52 (12/01 0800) SpO2:  [91 %-100 %] 100 % (12/01 1034) FiO2 (%):  [28 %] 28 % (12/01 1034) Weight:  [96.7 kg] 96.7 kg (12/01 0500)  Weight change: 1.1 kg Filed Weights   08/19/19 0530 08/20/19 0050 08/20/19 0500  Weight: 95.6 kg 96.7 kg 96.7 kg    Intake/Output: I/O last 3 completed shifts: In: 2880.9 [I.V.:1729.6; NG/GT:1151.3] Out: 130 [Other:130]   Intake/Output this shift:  Total I/O In: 635.8 [I.V.:35.8; NG/GT:600] Out: 224 [Other:224]  Physical Exam: General: Critically ill  Head: ETT   Eyes:   PERRL  Neck:   trachea midline  Lungs:  PRVC FiO2 28%  Heart: Regular rate and rhythm  Abdomen:  Soft,   obese  Extremities:  + peripheral edema.  Neurologic: Intubated and sedated  Skin: No lesions  Access: Left AVF Left femoral temp dialysis catheter 53/66    Basic Metabolic Panel: Recent Labs  Lab 08/14/19 0002  08/15/19 0620  08/17/19 0504  08/18/19 0454 08/19/19 0552 08/20/19 0141 08/20/19 0534 08/20/19 0959  NA 138  --  137   < > 136  --  138 139 138 136 137  K 5.0  --  3.8   < > 3.7  --  4.0 4.5 5.3* 4.8 4.5  CL 102  --  96*   < > 93*  --  97* 96* 97* 97* 99  CO2 18*  --  22   < > 23  --  23 23 21* 22 21*  GLUCOSE 115*  --  98   < > 99  --  102* 157* 172* 176* 169*  BUN 57*  --  32*   < > 56*  --  35* 78* 109* 102* 88*  CREATININE 10.24*  --  6.32*   < > 6.26*  --  4.09* 6.31* 8.01* 7.10* 5.82*  CALCIUM 7.8*  --  7.9*   < > 8.6*  --  8.5* 8.7* 8.4* 8.6* 8.9  MG 1.6*  --  1.9  --  2.0  --   --  2.1  --  2.1   --   PHOS 7.4*   < > 6.5*   < > 7.2*   < > 5.4* 5.7* 5.8* 5.5* 4.7*   < > = values in this interval not displayed.    Liver Function Tests: Recent Labs  Lab 08/17/19 0504 08/18/19 0454 08/20/19 0141 08/20/19 0534 08/20/19 0959  AST 108*  --   --   --   --   ALT 60*  --   --   --   --   ALKPHOS 73  --   --   --   --   BILITOT 1.0  --   --   --   --   PROT 7.4  --   --   --   --   ALBUMIN 3.2* 3.3* 2.6* 2.8* 2.8*   No results for input(s): LIPASE, AMYLASE in the last 168 hours. No results for input(s): AMMONIA in the last 168 hours.  CBC: Recent Labs  Lab 08/15/19 0620 08/16/19 0518 08/18/19 0454 08/19/19 0552 08/20/19 0534  WBC 8.8 10.6* 15.6* 16.2* 23.5*  NEUTROABS 6.9  --  12.4*  --  20.2*  HGB 7.6* 7.8* 8.6* 9.3* 8.6*  HCT 24.3* 24.9* 27.7* 29.2* 26.6*  MCV 102.1* 100.4* 103.0* 100.0 98.2  PLT 209 196 180 168 137*    Cardiac Enzymes: No results for input(s): CKTOTAL, CKMB, CKMBINDEX, TROPONINI in the last 168 hours.  BNP: Invalid input(s): POCBNP  CBG: Recent Labs  Lab 08/19/19 2049 08/20/19 0020 08/20/19 0503 08/20/19 0729 08/20/19 1105  GLUCAP 176* 154* 166* 160* 152*    Microbiology: Results for orders placed or performed during the hospital encounter of 07/23/2019  SARS Coronavirus 2 by RT PCR (hospital order, performed in [facility] hospital lab) Nasopharyngeal Nasopharyngeal Swab     Status: None   Collection Time: 07/23/2019 12:52 PM   Specimen: Nasopharyngeal Swab  Result Value Ref Range Status   SARS Coronavirus 2 NEGATIVE NEGATIVE Final    Comment: (NOTE) SARS-CoV-2 target nucleic acids are NOT DETECTED. The SARS-CoV-2 RNA is generally detectable in upper and lower respiratory specimens during the acute phase of infection. The lowest concentration of SARS-CoV-2 viral copies this assay can detect is 250 copies / mL. A negative result does not preclude SARS-CoV-2 infection and should not be used as the sole basis for treatment or  other patient management decisions.  A negative result may occur with improper specimen collection / handling, submission of specimen other than nasopharyngeal swab, presence of viral mutation(s) within the areas targeted by this assay, and inadequate number of viral copies (<250 copies / mL). A negative result must be combined with clinical observations, patient history, and epidemiological information. Fact Sheet for Patients:   StrictlyIdeas.no Fact Sheet for Healthcare Providers: BankingDealers.co.za This test is not yet approved or cleared  by the [place] FDA and has been authorized for detection and/or diagnosis of SARS-CoV-2 by FDA under an Emergency Use Authorization (EUA).  This EUA will remain in effect (meaning this test can be used) for the duration of the COVID-19 declaration under Section 564(b)(1) of the Act, 21 U.S.C. section 360bbb-3(b)(1), unless the authorization is terminated or revoked sooner. Performed at [facility], [address] [place], [place] [address]   Urine culture     Status: Abnormal   Collection Time: 08/12/2019  1:38 PM   Specimen: Urine, Random  Result Value Ref Range Status   Specimen Description   Final    URINE, RANDOM Performed at [facility], [address] [place], [place] [address]    Special Requests   Final    NONE Performed at [facility], [address] [place], [place] [address]    Culture 20,000 COLONIES/mL STAPHYLOCOCCUS AUREUS (A)  Final   Report Status 08/15/2019 FINAL  Final   Organism ID, Bacteria STAPHYLOCOCCUS AUREUS (A)  Final      Susceptibility   Staphylococcus aureus - MIC*    CIPROFLOXACIN <=0.5 SENSITIVE Sensitive     GENTAMICIN <=0.5 SENSITIVE Sensitive     NITROFURANTOIN <=16 SENSITIVE Sensitive     OXACILLIN 1 SENSITIVE Sensitive     TETRACYCLINE <=1 SENSITIVE Sensitive     VANCOMYCIN <=0.5 SENSITIVE Sensitive      TRIMETH/SULFA <=10 SENSITIVE Sensitive     CLINDAMYCIN <=0.25 SENSITIVE Sensitive     RIFAMPIN <=0.5 SENSITIVE Sensitive     Inducible Clindamycin NEGATIVE Sensitive     * 20,000 COLONIES/mL STAPHYLOCOCCUS AUREUS  MRSA PCR Screening     Status: None   Collection Time: 08/12/2019 11:05 PM   Specimen: Nasal Mucosa; Nasopharyngeal  Result Value Ref Range Status   MRSA by PCR NEGATIVE NEGATIVE Final    Comment:        The GeneXpert MRSA Assay (FDA approved for NASAL specimens only), is one component of a comprehensive MRSA colonization surveillance program. It is not intended to diagnose MRSA infection nor to guide or monitor treatment for MRSA infections. Performed at [facility], [address] [place], [place] [address]   Culture, respiratory (non-expectorated)     Status: None   Collection Time: 08/16/19  3:36 PM   Specimen: Tracheal Aspirate; Respiratory  Result Value Ref Range Status   Specimen Description   Final    TRACHEAL ASPIRATE Performed at [facility], [address] [place], [place] [address]    Special Requests   Final    NONE Performed at [facility], [place], [place] [address]    Gram Stain   Final    RARE WBC PRESENT, PREDOMINANTLY PMN MODERATE GRAM POSITIVE COCCI RARE GRAM POSITIVE RODS Performed at [facility], [place] [address], [place], [place] [address]    Culture   Final    MODERATE METHICILLIN RESISTANT STAPHYLOCOCCUS AUREUS   Report Status 08/19/2019 FINAL  Final   Organism ID, Bacteria METHICILLIN RESISTANT STAPHYLOCOCCUS AUREUS  Final      Susceptibility   Methicillin resistant staphylococcus aureus - MIC*    CIPROFLOXACIN <=0.5 SENSITIVE Sensitive     ERYTHROMYCIN >=8 RESISTANT Resistant     GENTAMICIN <=0.5 SENSITIVE Sensitive     OXACILLIN >=4 RESISTANT Resistant     TETRACYCLINE <=1 SENSITIVE Sensitive     VANCOMYCIN 1 SENSITIVE Sensitive     TRIMETH/SULFA <=10 SENSITIVE Sensitive      CLINDAMYCIN RESISTANT Resistant     RIFAMPIN <=0.5 SENSITIVE Sensitive     Inducible Clindamycin POSITIVE Resistant     * MODERATE METHICILLIN RESISTANT STAPHYLOCOCCUS AUREUS  CULTURE, BLOOD (ROUTINE X 2) w Reflex to ID Panel     Status: None (Preliminary result)   Collection Time: 08/16/19  4:01 PM   Specimen: BLOOD  Result Value Ref Range Status   Specimen Description BLOOD BLOOD RIGHT HAND  Final   Special Requests   Final    BOTTLES DRAWN AEROBIC AND ANAEROBIC Blood Culture adequate volume   Culture   Final    NO GROWTH 4 DAYS Performed at [facility], [address], [place], [place] [address]    Report Status PENDING  Incomplete  CULTURE, BLOOD (ROUTINE X 2) w Reflex to ID Panel     Status: None (Preliminary result)   Collection Time: 08/16/19  4:09 PM   Specimen: BLOOD  Result Value Ref Range Status   Specimen Description BLOOD BLOOD RIGHT WRIST  Final   Special Requests   Final    BOTTLES DRAWN AEROBIC ONLY Blood Culture results may not be optimal due to an inadequate volume of blood received in culture bottles   Culture   Final    NO GROWTH 4 DAYS Performed at [facility], [address] [place], [place] [address]    Report Status PENDING  Incomplete    Coagulation Studies: Recent Labs    08/19/19 1021  LABPROT 16.3*  INR 1.3*    Urinalysis: No results for input(s): COLORURINE, LABSPEC, PHURINE, GLUCOSEU, HGBUR, BILIRUBINUR, KETONESUR, PROTEINUR, UROBILINOGEN, NITRITE, LEUKOCYTESUR in the last  72 hours.  Invalid input(s): APPERANCEUR    Imaging: Dg Chest Port 1 View  Result Date: 08/19/2019 CLINICAL DATA:  68 year old female with central line placement. EXAM: PORTABLE CHEST 1 VIEW COMPARISON:  Earlier radiograph dated 08/19/2019. FINDINGS: Endotracheal tube above the carina and enteric tube extending below the diaphragm with tip beyond the inferior margin of the image. Bilateral hazy and streaky densities as seen previously most  consistent with infiltrate. No large pleural effusion or pneumothorax. Stable cardiac silhouette. Atherosclerotic calcification of the aorta. No acute osseous pathology. IMPRESSION: 1. Endotracheal tube above the carina and enteric tube with tip beyond the inferior margin of the image. 2. No significant interval change in the appearance of the lungs. Electronically Signed   By: Anner Crete M.D.   On: 08/19/2019 20:55   Dg Chest Port 1 View  Result Date: 08/19/2019 CLINICAL DATA:  Respiratory failure. EXAM: PORTABLE CHEST 1 VIEW COMPARISON:  08/18/2019.  08/17/2019. FINDINGS: Endotracheal tube and NG tube in stable position. Stable cardiomegaly. No pulmonary venous congestion. Persistent but improved right lung infiltrate with residual infiltrate right lung base. Persistent bibasilar atelectasis. No pleural effusion or pneumothorax. IMPRESSION: 1. Lines and tubes stable position. 2. Stable cardiomegaly. No pulmonary venous congestion. 2. Persistent but improved right lung infiltrate with residual infiltrate in the right lung base. Persistent bibasilar atelectasis. Electronically Signed   By: Marcello Moores  Register   On: 08/19/2019 06:06     Medications:   . sodium chloride 250 mL (08/19/19 1927)  . sodium chloride    . phenylephrine (NEO-SYNEPHRINE) Adult infusion 30 mcg/min (08/20/19 0900)  . prismasol BGK 2/2.5 dialysis solution 2,000 mL/hr at 08/20/19 0320  . prismasol BGK 2/2.5 replacement solution 200 mL/hr at 08/20/19 0320  . prismasol BGK 2/2.5 replacement solution 200 mL/hr at 08/20/19 0320  . vancomycin 1,000 mg (08/19/19 1927)   . chlorhexidine gluconate (MEDLINE KIT)  15 mL Mouth Rinse BID  . Chlorhexidine Gluconate Cloth  6 each Topical Q0600  . epoetin (EPOGEN/PROCRIT) injection  10,000 Units Subcutaneous Q T,Th,Sa-HD  . feeding supplement (PRO-STAT SUGAR FREE 64)  30 mL Per Tube TID  . feeding supplement (VITAL HIGH PROTEIN)  1,000 mL Per Tube Q24H  . heparin injection  (subcutaneous)  5,000 Units Subcutaneous Q12H  . hydrocortisone sod succinate (SOLU-CORTEF) inj  100 mg Intravenous Q12H  . mouth rinse  15 mL Mouth Rinse 10 times per day  . pantoprazole (PROTONIX) IV  40 mg Intravenous BID  . sodium chloride flush  3 mL Intravenous Q12H   sodium chloride, acetaminophen, heparin, heparin, sodium chloride, sodium chloride flush  Assessment/ Plan:  [name] is a 68 y.o. black female with stage renal disease on hemodialysis, coronary artery disease status post CABG, peripheral arterial disease, COPD, hypertension, hyperlipidemia, diabetes type 2, CVA, history of anxiety and depression, obstructive sleep apnea admitted to [facility] on 07/30/2019 for Shortness of breath [R06.02] Acute pulmonary edema ([place]) [J81.0] SOB (shortness of breath) [R06.02] ESRD on dialysis ([place]) [N18.6, Z99.2] Encounter for imaging study to confirm orogastric (OG) tube placement [Z01.89] Urinary tract infection without hematuria, site unspecified [N39.0]  [place] Left AVF  1. End Stage Renal Disease: hemodynamically unstable for intermittent hemodialysis treatment.  Continue continous renal replacement therapy.   2. Acute respiratory failure requiring mechanical ventilation. With MRSA pneumonia - Vancomycin.  - Appreciate pulmonary/critical care input.   3. Anemia of chronic kidney disease: macrocytic. Hemoglobin 8.6 EPO changed to subcu  4. Hypotension with sepsis :  MRSA pneumonia Requiring phenylephrine  - vancomycin.    LOS: 7 [place] 12/1/202011:36 AM

## 2019-08-20 NOTE — Consult Note (Signed)
Pharmacy Antibiotic Note  Kimberly Shaffer is a 68 y.o. female admitted on 08/19/2019 with pneumonia.  Pharmacy has been consulted for Vancomycin dosing. Respiratory culture positive for Staphylococcus Aureus.  Currently on ventilator. Patient receives scheduled dialysis on Tuesday, Thursday, and Saturdays. Patient being transitioned to CRRT on 11/30.   Plan: Continue vancomycin 1g IV Q24hr while patient receiving CRRT. Will plan to check level prior to dose on 12/2 to help guide therapy.   Height: 5\' 6"  (167.6 cm) Weight: 213 lb 3 oz (96.7 kg) IBW/kg (Calculated) : 59.3  Temp (24hrs), Avg:99 F (37.2 C), Min:98 F (36.7 C), Max:99.6 F (37.6 C)  Recent Labs  Lab 08/15/19 0620 08/16/19 0518  08/18/19 0454 08/19/19 0552 08/20/19 0141 08/20/19 0534 08/20/19 0959 08/20/19 1320  WBC 8.8 10.6*  --  15.6* 16.2*  --  23.5*  --   --   CREATININE 6.32* 4.09*   < > 4.09* 6.31* 8.01* 7.10* 5.82* 5.50*  LATICACIDVEN  --   --   --   --  2.2*  --   --   --   --    < > = values in this interval not displayed.    Estimated Creatinine Clearance: 11.5 mL/min (A) (by C-G formula based on SCr of 5.5 mg/dL (H)).    Allergies  Allergen Reactions  . Nystatin Hives and Itching  . Sulfa Antibiotics Swelling, Hives and Rash  . Niaspan [Niacin] Other (See Comments) and Hives    Antimicrobials this admission: Ceftriaxone 11/24 x 1 Vancomycin 11/28 >>   Microbiology results: 11/28 BCx: no growth x 4 days  11/24 UCx: 20k Staph Aureus  11/27 Tracheal Aspirate: MRSA   Thank you for allowing pharmacy to be a part of this patient's care.  Simpson,Michael L 08/20/2019 4:23 PM

## 2019-08-20 NOTE — Progress Notes (Signed)
CRITICAL CARE PROGRESS NOTE    Name: Kimberly Shaffer MRN: [id number] DOB: 07-Nov-1950     LOS: 7   SUBJECTIVE FINDINGS & SIGNIFICANT EVENTS   Patient description:   Kimberly Shaffer is an 68 y.o. femalewith past medical history of CAD status post CABG, hypertension, hyperlipidemia, diabetes, COPDon 2L [place], and ESRD on HD who presents to the ED complaining of shortness of breath and generalized weakness -patient with severe hypoxia progressive resp failure placed on BiPAP She has ESRD on HD last HD was Saturday Failed BiPAP and now intubated and placed on MV support  Lines / Drains: PIVx3  Cultures / Sepsis markers: Tracheal aspirate MRSA + Urine Cx - Staph aureus - 20K  Antibiotics: Vancomycin   Protocols / Consultants: Nephrology  Critical care    Tests / Events: CRRT  Overnight: Remains critically ill with septic shock   PAST MEDICAL HISTORY   Past Medical History:  Diagnosis Date  . Asthma   . CAD (coronary artery disease)   . Chronic lower back pain   . COPD (chronic obstructive pulmonary disease) ([place])   . Depression   . Diabetes mellitus without complication (HCC)    NIDD  . H/O angina pectoris   . H/O blood clots   . Hypercholesteremia   . Hypertension   . Left rotator cuff tear   . Myocardial infarction ([place])   . Obesity   . Renal insufficiency   . Stroke ([place])   . Vertigo, aural      SURGICAL HISTORY   Past Surgical History:  Procedure Laterality Date  . A/V FISTULAGRAM Left 03/14/2017   Procedure: A/V Fistulagram;  Surgeon: Katha Cabal, MD;  Location: [facility];  Service: Cardiovascular;  Laterality: Left;  . AV FISTULA PLACEMENT Left 11-04-2015  . AV FISTULA PLACEMENT Left 11/04/2015   Procedure: ARTERIOVENOUS (AV) FISTULA CREATION  ( BRACHIAL  CEPHALIC );  Surgeon: Katha Cabal, MD;  Location: [facility];  Service: Vascular;  Laterality: Left;  . CARDIAC CATHETERIZATION    . CARDIAC CATHETERIZATION Left 07/19/2016   Procedure: Left Heart Cath and Coronary Angiography;  Surgeon: Corey Skains, MD;  Location: [facility];  Service: Cardiovascular;  Laterality: Left;  . CORONARY ARTERY BYPASS GRAFT  2012  . CORONARY STENT PLACEMENT    . JOINT REPLACEMENT    . KNEE SURGERY Bilateral   . LEFT HEART CATH AND CORONARY ANGIOGRAPHY N/A 12/25/2017   Procedure: LEFT HEART CATH AND CORONARY ANGIOGRAPHY;  Surgeon: Teodoro Spray, MD;  Location: [facility];  Service: Cardiovascular;  Laterality: N/A;  . LEFT HEART CATH AND CORONARY ANGIOGRAPHY Right 03/23/2018   Procedure: LEFT HEART CATH AND CORONARY ANGIOGRAPHY;  Surgeon: Dionisio David, MD;  Location: [facility];  Service: Cardiovascular;  Laterality: Right;  . LEFT HEART CATH AND CORONARY ANGIOGRAPHY N/A 05/30/2018   Procedure: LEFT HEART CATH AND CORONARY ANGIOGRAPHY;  Surgeon: Corey Skains, MD;  Location: [facility];  Service: Cardiovascular;  Laterality: N/A;  . PERIPHERAL VASCULAR CATHETERIZATION N/A 11/13/2015   Procedure: Dialysis/Perma Catheter Insertion;  Surgeon: Katha Cabal, MD;  Location: [facility];  Service: Cardiovascular;  Laterality: N/A;  . PERIPHERAL VASCULAR CATHETERIZATION N/A 12/04/2015   Procedure: Dialysis/Perma Catheter Insertion;  Surgeon: Katha Cabal, MD;  Location: [facility];  Service: Cardiovascular;  Laterality: N/A;  . PERIPHERAL VASCULAR CATHETERIZATION Left 12/31/2015   Procedure: A/V Shuntogram/Fistulagram;  Surgeon: Erskine Squibb  Lucky Cowboy, MD;  Location: [facility];  Service: Cardiovascular;  Laterality: Left;  . PERIPHERAL VASCULAR CATHETERIZATION N/A 12/31/2015   Procedure: A/V Shunt Intervention;  Surgeon: Algernon Huxley, MD;  Location: [facility];  Service: Cardiovascular;   Laterality: N/A;  . PERIPHERAL VASCULAR CATHETERIZATION Left 02/25/2016   Procedure: A/V Shuntogram/Fistulagram;  Surgeon: Algernon Huxley, MD;  Location: [facility];  Service: Cardiovascular;  Laterality: Left;  . PERIPHERAL VASCULAR CATHETERIZATION N/A 03/18/2016   Procedure: Dialysis/Perma Catheter Removal;  Surgeon: Katha Cabal, MD;  Location: [facility];  Service: Cardiovascular;  Laterality: N/A;  . REMOVAL OF A DIALYSIS CATHETER N/A 06/30/2017   Procedure: REMOVAL OF A DIALYSIS CATHETER;  Surgeon: Katha Cabal, MD;  Location: [facility];  Service: Vascular;  Laterality: N/A;  . REPLACEMENT TOTAL KNEE BILATERAL Bilateral      FAMILY HISTORY   Family History  Problem Relation Age of Onset  . Cancer Mother   . Cancer Father   . Diabetes Brother   . Heart disease Brother      SOCIAL HISTORY   Social History   Tobacco Use  . Smoking status: Current Every Day Smoker    Packs/day: 0.25    Types: Cigarettes  . Smokeless tobacco: Never Used  Substance Use Topics  . Alcohol use: No    Alcohol/week: 0.0 standard drinks  . Drug use: No     MEDICATIONS   Current Medication:  Current Facility-Administered Medications:  .  0.9 %  sodium chloride infusion, 250 mL, Intravenous, PRN, Flora Lipps, MD, Last Rate: 5 mL/hr at 08/19/19 1927, 250 mL at 08/19/19 1927 .  0.9 %  sodium chloride infusion, 250 mL, Intravenous, Continuous, Roniesha Hollingshead, MD .  acetaminophen (TYLENOL) tablet 650 mg, 650 mg, Oral, Q4H PRN, Flora Lipps, MD, 650 mg at 08/19/19 0752 .  chlorhexidine gluconate (MEDLINE KIT) (PERIDEX) 0.12 % solution 15 mL, 15 mL, Mouth Rinse, BID, Kasa, Kurian, MD, 15 mL at 08/20/19 0814 .  Chlorhexidine Gluconate Cloth 2 % PADS 6 each, 6 each, Topical, Q0600, Flora Lipps, MD, 6 each at 08/20/19 0504 .  epoetin alfa (EPOGEN) injection 10,000 Units, 10,000 Units, Subcutaneous, Q T,Th,Sa-HD, Kolluru, Sarath, MD .  feeding supplement (PRO-STAT SUGAR FREE  64) liquid 30 mL, 30 mL, Per Tube, TID, Tyler Pita, MD, 30 mL at 08/19/19 2126 .  feeding supplement (VITAL HIGH PROTEIN) liquid 1,000 mL, 1,000 mL, Per Tube, Q24H, Vernard Gambles L, MD, 1,000 mL at 08/19/19 1124 .  heparin injection 1,000-6,000 Units, 1,000-6,000 Units, Intravenous, PRN, Awilda Bill, NP .  heparin injection 1,000-6,000 Units, 1,000-6,000 Units, CRRT, PRN, Kolluru, Sarath, MD, 3,000 Units at 08/20/19 0457 .  heparin injection 5,000 Units, 5,000 Units, Subcutaneous, Q12H, Tyler Pita, MD, 5,000 Units at 08/19/19 2126 .  hydrocortisone sodium succinate (SOLU-CORTEF) 100 MG injection 100 mg, 100 mg, Intravenous, Q12H, Ottie Glazier, MD, 100 mg at 08/20/19 0046 .  MEDLINE mouth rinse, 15 mL, Mouth Rinse, 10 times per day, Flora Lipps, MD, 15 mL at 08/20/19 0636 .  pantoprazole (PROTONIX) injection 40 mg, 40 mg, Intravenous, BID, Tyler Pita, MD, 40 mg at 08/19/19 2126 .  phenylephrine (NEO-SYNEPHRINE) 40 mg in sodium chloride 0.9 % 250 mL (0.16 mg/mL) infusion, 0-400 mcg/min, Intravenous, Titrated, Blakeney, Dana G, NP, Last Rate: 11.25 mL/hr at 08/20/19 0900, 30 mcg/min at 08/20/19 0900 .  prismasol BGK 2/2.5 dialysis solution, , CRRT, Continuous, Kolluru, Sarath, MD, Last Rate: 2,000  mL/hr at 08/20/19 0320 .  prismasol BGK 2/2.5 replacement solution, , CRRT, Continuous, Kolluru, Sarath, MD, Last Rate: 200 mL/hr at 08/20/19 0320 .  prismasol BGK 2/2.5 replacement solution, , CRRT, Continuous, Kolluru, Sarath, MD, Last Rate: 200 mL/hr at 08/20/19 0320 .  sodium chloride 0.9 % primer fluid for CRRT, , CRRT, PRN, Kolluru, Sarath, MD .  sodium chloride flush (NS) 0.9 % injection 3 mL, 3 mL, Intravenous, Q12H, Kasa, Kurian, MD, 3 mL at 08/19/19 2134 .  sodium chloride flush (NS) 0.9 % injection 3 mL, 3 mL, Intravenous, PRN, Flora Lipps, MD .  vancomycin (VANCOCIN) IVPB 1000 mg/200 mL premix, 1,000 mg, Intravenous, Q24H, [name], RPH, Last Rate: 200  mL/hr at 08/19/19 1927, 1,000 mg at 08/19/19 1927    ALLERGIES   Nystatin, Sulfa antibiotics, and Niaspan [niacin]    REVIEW OF SYSTEMS     Unable to obtain while critically ill on mechanical ventilation  PHYSICAL EXAMINATION   Vital Signs: Temp:  [98.9 F (37.2 C)-100 F (37.8 C)] 99.3 F (37.4 C) (12/01 0800) Pulse Rate:  [42-100] 81 (12/01 0500) Resp:  [20-29] 23 (12/01 0800) BP: (81-165)/(44-61) 165/52 (12/01 0800) SpO2:  [91 %-100 %] 91 % (12/01 0800) FiO2 (%):  [28 %] 28 % (12/01 0800) Weight:  [96.7 kg] 96.7 kg (12/01 0500)  GENERAL:Sedated on MV HEAD: Normocephalic, atraumatic.  EYES: Pupils equal, round, reactive to light.  No scleral icterus.  MOUTH: Moist mucosal membrane. NECK: Supple. No thyromegaly. No nodules. No JVD.  PULMONARY: MV breath sounds without rhonchi CARDIOVASCULAR: S1 and S2. Regular rate and rhythm. No murmurs, rubs, or gallops.  GASTROINTESTINAL: Soft, nontender, non-distended. No masses. Positive bowel sounds. No hepatosplenomegaly.  MUSCULOSKELETAL: No swelling, clubbing, or edema.  NEUROLOGIC: sedated on MV SKIN:intact,warm,dry   PERTINENT DATA     Infusions: . sodium chloride 250 mL (08/19/19 1927)  . sodium chloride    . phenylephrine (NEO-SYNEPHRINE) Adult infusion 30 mcg/min (08/20/19 0900)  . prismasol BGK 2/2.5 dialysis solution 2,000 mL/hr at 08/20/19 0320  . prismasol BGK 2/2.5 replacement solution 200 mL/hr at 08/20/19 0320  . prismasol BGK 2/2.5 replacement solution 200 mL/hr at 08/20/19 0320  . vancomycin 1,000 mg (08/19/19 1927)   Scheduled Medications: . chlorhexidine gluconate (MEDLINE KIT)  15 mL Mouth Rinse BID  . Chlorhexidine Gluconate Cloth  6 each Topical Q0600  . epoetin (EPOGEN/PROCRIT) injection  10,000 Units Subcutaneous Q T,Th,Sa-HD  . feeding supplement (PRO-STAT SUGAR FREE 64)  30 mL Per Tube TID  . feeding supplement (VITAL HIGH PROTEIN)  1,000 mL Per Tube Q24H  . heparin injection  (subcutaneous)  5,000 Units Subcutaneous Q12H  . hydrocortisone sod succinate (SOLU-CORTEF) inj  100 mg Intravenous Q12H  . mouth rinse  15 mL Mouth Rinse 10 times per day  . pantoprazole (PROTONIX) IV  40 mg Intravenous BID  . sodium chloride flush  3 mL Intravenous Q12H   PRN Medications: sodium chloride, acetaminophen, heparin, heparin, sodium chloride, sodium chloride flush Hemodynamic parameters:   Intake/Output: 11/30 0701 - 12/01 0700 In: 2108.9 [I.V.:1612.2; NG/GT:496.7] Out: 130   Ventilator  Settings: Vent Mode: PRVC FiO2 (%):  [28 %] 28 % Set Rate:  [23 bmp] 23 bmp Vt Set:  [530 mL] 530 mL PEEP:  [5 cmH20] 5 cmH20 Plateau Pressure:  [20 cmH20] 20 cmH20    LAB RESULTS:  Basic Metabolic Panel: Recent Labs  Lab 08/14/19 0002  08/15/19 0620  08/17/19 0504 08/17/19 1312 08/18/19 0454 08/19/19 0552 08/20/19 0141  08/20/19 0534  NA 138  --  137   < > 136  --  138 139 138 136  K 5.0  --  3.8   < > 3.7  --  4.0 4.5 5.3* 4.8  CL 102  --  96*   < > 93*  --  97* 96* 97* 97*  CO2 18*  --  22   < > 23  --  23 23 21* 22  GLUCOSE 115*  --  98   < > 99  --  102* 157* 172* 176*  BUN 57*  --  32*   < > 56*  --  35* 78* 109* 102*  CREATININE 10.24*  --  6.32*   < > 6.26*  --  4.09* 6.31* 8.01* 7.10*  CALCIUM 7.8*  --  7.9*   < > 8.6*  --  8.5* 8.7* 8.4* 8.6*  MG 1.6*  --  1.9  --  2.0  --   --  2.1  --  2.1  PHOS 7.4*   < > 6.5*   < > 7.2* 8.9* 5.4* 5.7* 5.8* 5.5*   < > = values in this interval not displayed.   Liver Function Tests: Recent Labs  Lab 08/16/19 0518 08/17/19 0504 08/18/19 0454 08/20/19 0141 08/20/19 0534  AST  --  108*  --   --   --   ALT  --  60*  --   --   --   ALKPHOS  --  73  --   --   --   BILITOT  --  1.0  --   --   --   PROT  --  7.4  --   --   --   ALBUMIN 3.3* 3.2* 3.3* 2.6* 2.8*   No results for input(s): LIPASE, AMYLASE in the last 168 hours. No results for input(s): AMMONIA in the last 168 hours. CBC: Recent Labs  Lab 08/15/19 0620  08/16/19 0518 08/18/19 0454 08/19/19 0552 08/20/19 0534  WBC 8.8 10.6* 15.6* 16.2* 23.5*  NEUTROABS 6.9  --  12.4*  --  20.2*  HGB 7.6* 7.8* 8.6* 9.3* 8.6*  HCT 24.3* 24.9* 27.7* 29.2* 26.6*  MCV 102.1* 100.4* 103.0* 100.0 98.2  PLT 209 196 180 168 137*   Cardiac Enzymes: No results for input(s): CKTOTAL, CKMB, CKMBINDEX, TROPONINI in the last 168 hours. BNP: Invalid input(s): POCBNP CBG: Recent Labs  Lab 08/19/19 1110 08/19/19 1635 08/19/19 2049 08/20/19 0503 08/20/19 0729  GLUCAP 135* 156* 176* 166* 160*     IMAGING RESULTS:  Imaging: Dg Chest Port 1 View  Result Date: 08/19/2019 CLINICAL DATA:  68 year old female with central line placement. EXAM: PORTABLE CHEST 1 VIEW COMPARISON:  Earlier radiograph dated 08/19/2019. FINDINGS: Endotracheal tube above the carina and enteric tube extending below the diaphragm with tip beyond the inferior margin of the image. Bilateral hazy and streaky densities as seen previously most consistent with infiltrate. No large pleural effusion or pneumothorax. Stable cardiac silhouette. Atherosclerotic calcification of the aorta. No acute osseous pathology. IMPRESSION: 1. Endotracheal tube above the carina and enteric tube with tip beyond the inferior margin of the image. 2. No significant interval change in the appearance of the lungs. Electronically Signed   By: Anner Crete M.D.   On: 08/19/2019 20:55   Dg Chest Port 1 View  Result Date: 08/19/2019 CLINICAL DATA:  Respiratory failure. EXAM: PORTABLE CHEST 1 VIEW COMPARISON:  08/18/2019.  08/17/2019. FINDINGS: Endotracheal tube and NG tube in  stable position. Stable cardiomegaly. No pulmonary venous congestion. Persistent but improved right lung infiltrate with residual infiltrate right lung base. Persistent bibasilar atelectasis. No pleural effusion or pneumothorax. IMPRESSION: 1. Lines and tubes stable position. 2. Stable cardiomegaly. No pulmonary venous congestion. 2. Persistent but  improved right lung infiltrate with residual infiltrate in the right lung base. Persistent bibasilar atelectasis. Electronically Signed   By: Marcello Moores  Register   On: 08/19/2019 06:06      ASSESSMENT AND PLAN    -Multidisciplinary rounds held today  Acute Hypoxic Respiratory Failure -etiology is multifactorial including  -MRSA pneumonia, acute on chronic HFrEF, bibasilar atelectasis, fluid overload due to ESRD.  -continue Vancomycin per pharmD -treat atelectasis with MetaNEB while on ventilator and recruitment maneuvers via ventilator -ESRD will start CRRT   Acute on chronic exacerbation of systolic CHF with EF 56-94% - diuresis has been difficult with severe renal failure -will perform CRRT ICU monitoring  ESRD -s/p evaluation by nephrology - patient not able to tolerate HD and requires CRRT as per renal team -Epogen 10K ongoing  -follow chem 7 -follow UO -continue Foley Catheter-assess need daily      Septic shock  - due to pneumonia with MRSA  -use vasopressors to keep MAP>65-currently on Neosynephrine -follow ABG and LA -Vancomycin IV per pharmacy -consider stress dose steroids-Solucortef -100BID   ID -continue IV abx as prescibed -follow up cultures  GI/Nutrition GI PROPHYLAXIS as indicated-protonix 40 IV DIET-->TF's as tolerated Constipation protocol as indicated  ENDO - ICU hypoglycemic\Hyperglycemia protocol -check FSBS per protocol   ELECTROLYTES -follow labs as needed -replace as needed -pharmacy consultation   DVT/GI PRX ordered -heparin 5K q12h TRANSFUSIONS AS NEEDED MONITOR FSBS ASSESS the need for LABS as needed   Critical care provider statement:    Critical care time (minutes):  33   Critical care time was exclusive of:  Separately billable procedures and treating other patients   Critical care was necessary to treat or prevent imminent or life-threatening deterioration of the following conditions:  Septic shock, MRSA pneumonia,  ESRD, acute hypoxemic respiratory failure, multiple comorbid conditions   Critical care was time spent personally by me on the following activities:  Development of treatment plan with patient or surrogate, discussions with consultants, evaluation of patient's response to treatment, examination of patient, obtaining history from patient or surrogate, ordering and performing treatments and interventions, ordering and review of laboratory studies and re-evaluation of patient's condition.  I assumed direction of critical care for this patient from another provider in my specialty: no    This document was prepared using Dragon voice recognition software and may include unintentional dictation errors.    Ottie Glazier, M.D.  Division of [facility]

## 2019-08-20 NOTE — Consult Note (Addendum)
Pharmacy Antifungal Note  Kimberly Shaffer is a 68 y.o. female admitted on 08/10/2019 with pneumonia. Patient is currently intubated and mechanically ventilated in the ICU on vancomycin for MRSA pneumonia. She is on CRRT. Pharmacy has been consulted for fluconazole dosing for oropharyngeal candidiasis.   Per IDSA Candidiasis guidelines fluconazole is dosed 100-200 mg daily for 7-14 days for moderate-severe oropharyngeal candidiasis.  Considering current clinical status, limited options for antifungal formulations. Patient with allergy to Nystatin (hives/itching). LFTs mildly elevated on 11/28. No pertinent DDI between fluconazole and other ordered medications identified.   Plan: Fluconazole 200 mg IV daily  Height: 5\' 6"  (167.6 cm) Weight: 213 lb 3 oz (96.7 kg) IBW/kg (Calculated) : 59.3  Temp (24hrs), Avg:98.8 F (37.1 C), Min:98 F (36.7 C), Max:99.6 F (37.6 C)  Recent Labs  Lab 08/15/19 0620 08/16/19 0518  08/18/19 0454 08/19/19 0552 08/20/19 0141 08/20/19 0534 08/20/19 0959 08/20/19 1320 08/20/19 1734  WBC 8.8 10.6*  --  15.6* 16.2*  --  23.5*  --   --   --   CREATININE 6.32* 4.09*   < > 4.09* 6.31* 8.01* 7.10* 5.82* 5.50* 4.40*  LATICACIDVEN  --   --   --   --  2.2*  --   --   --   --   --    < > = values in this interval not displayed.    Estimated Creatinine Clearance: 14.4 mL/min (A) (by C-G formula based on SCr of 4.4 mg/dL (H)).    Allergies  Allergen Reactions  . Nystatin Hives and Itching  . Sulfa Antibiotics Swelling, Hives and Rash  . Niaspan [Niacin] Other (See Comments) and Hives    Thank you for allowing pharmacy to be a part of this patient's care.  [place] Resident 08/20/2019 7:59 PM

## 2019-08-20 NOTE — Progress Notes (Signed)
CRRT stopped at approximately 1048 due to machine malfunction.

## 2019-08-20 DEATH — deceased

## 2019-08-21 ENCOUNTER — Inpatient Hospital Stay: Payer: Medicare Other

## 2019-08-21 DIAGNOSIS — Z7189 Other specified counseling: Secondary | ICD-10-CM

## 2019-08-21 DIAGNOSIS — Z515 Encounter for palliative care: Secondary | ICD-10-CM

## 2019-08-21 LAB — RENAL FUNCTION PANEL
Albumin: 2.7 g/dL — ABNORMAL LOW (ref 3.5–5.0)
Albumin: 2.8 g/dL — ABNORMAL LOW (ref 3.5–5.0)
Albumin: 2.3 g/dL — ABNORMAL LOW (ref 3.5–5.0)
Albumin: 2.5 g/dL — ABNORMAL LOW (ref 3.5–5.0)
Albumin: 2.6 g/dL — ABNORMAL LOW (ref 3.5–5.0)
Albumin: 2.6 g/dL — ABNORMAL LOW (ref 3.5–5.0)
Anion gap: 14 (ref 5–15)
Anion gap: 13 (ref 5–15)
Anion gap: 13 (ref 5–15)
Anion gap: 13 (ref 5–15)
Anion gap: 13 (ref 5–15)
Anion gap: 16 — ABNORMAL HIGH (ref 5–15)
BUN: 60 mg/dL — ABNORMAL HIGH (ref 8–23)
BUN: 56 mg/dL — ABNORMAL HIGH (ref 8–23)
BUN: 52 mg/dL — ABNORMAL HIGH (ref 8–23)
BUN: 53 mg/dL — ABNORMAL HIGH (ref 8–23)
BUN: 45 mg/dL — ABNORMAL HIGH (ref 8–23)
BUN: 41 mg/dL — ABNORMAL HIGH (ref 8–23)
CO2: 24 mmol/L (ref 22–32)
CO2: 24 mmol/L (ref 22–32)
CO2: 21 mmol/L — ABNORMAL LOW (ref 22–32)
CO2: 24 mmol/L (ref 22–32)
CO2: 24 mmol/L (ref 22–32)
CO2: 23 mmol/L (ref 22–32)
Calcium: 9 mg/dL (ref 8.9–10.3)
Calcium: 9.3 mg/dL (ref 8.9–10.3)
Calcium: 8.2 mg/dL — ABNORMAL LOW (ref 8.9–10.3)
Calcium: 9 mg/dL (ref 8.9–10.3)
Calcium: 9.2 mg/dL (ref 8.9–10.3)
Calcium: 9.1 mg/dL (ref 8.9–10.3)
Chloride: 99 mmol/L (ref 98–111)
Chloride: 100 mmol/L (ref 98–111)
Chloride: 101 mmol/L (ref 98–111)
Chloride: 99 mmol/L (ref 98–111)
Chloride: 98 mmol/L (ref 98–111)
Chloride: 97 mmol/L — ABNORMAL LOW (ref 98–111)
Creatinine, Ser: 3.27 mg/dL — ABNORMAL HIGH (ref 0.44–1.00)
Creatinine, Ser: 2.92 mg/dL — ABNORMAL HIGH (ref 0.44–1.00)
Creatinine, Ser: 2.83 mg/dL — ABNORMAL HIGH (ref 0.44–1.00)
Creatinine, Ser: 2.52 mg/dL — ABNORMAL HIGH (ref 0.44–1.00)
Creatinine, Ser: 2.2 mg/dL — ABNORMAL HIGH (ref 0.44–1.00)
Creatinine, Ser: 2.16 mg/dL — ABNORMAL HIGH (ref 0.44–1.00)
GFR calc Af Amer: 16 mL/min — ABNORMAL LOW (ref >60)
GFR calc Af Amer: 18 mL/min — ABNORMAL LOW (ref >60)
GFR calc Af Amer: 19 mL/min — ABNORMAL LOW (ref >60)
GFR calc Af Amer: 22 mL/min — ABNORMAL LOW (ref >60)
GFR calc Af Amer: 26 mL/min — ABNORMAL LOW (ref >60)
GFR calc Af Amer: 26 mL/min — ABNORMAL LOW (ref >60)
GFR calc non Af Amer: 14 mL/min — ABNORMAL LOW (ref >60)
GFR calc non Af Amer: 16 mL/min — ABNORMAL LOW (ref >60)
GFR calc non Af Amer: 16 mL/min — ABNORMAL LOW (ref >60)
GFR calc non Af Amer: 19 mL/min — ABNORMAL LOW (ref >60)
GFR calc non Af Amer: 22 mL/min — ABNORMAL LOW (ref >60)
GFR calc non Af Amer: 23 mL/min — ABNORMAL LOW (ref >60)
Glucose, Bld: 140 mg/dL — ABNORMAL HIGH (ref 70–99)
Glucose, Bld: 159 mg/dL — ABNORMAL HIGH (ref 70–99)
Glucose, Bld: 159 mg/dL — ABNORMAL HIGH (ref 70–99)
Glucose, Bld: 128 mg/dL — ABNORMAL HIGH (ref 70–99)
Glucose, Bld: 132 mg/dL — ABNORMAL HIGH (ref 70–99)
Glucose, Bld: 157 mg/dL — ABNORMAL HIGH (ref 70–99)
Phosphorus: 3.1 mg/dL (ref 2.5–4.6)
Phosphorus: 2.9 mg/dL (ref 2.5–4.6)
Phosphorus: 2.8 mg/dL (ref 2.5–4.6)
Phosphorus: 2.7 mg/dL (ref 2.5–4.6)
Phosphorus: 2.6 mg/dL (ref 2.5–4.6)
Phosphorus: 3.1 mg/dL (ref 2.5–4.6)
Potassium: 3.8 mmol/L (ref 3.5–5.1)
Potassium: 4 mmol/L (ref 3.5–5.1)
Potassium: 4.2 mmol/L (ref 3.5–5.1)
Potassium: 3.7 mmol/L (ref 3.5–5.1)
Potassium: 4 mmol/L (ref 3.5–5.1)
Potassium: 4 mmol/L (ref 3.5–5.1)
Sodium: 137 mmol/L (ref 135–145)
Sodium: 137 mmol/L (ref 135–145)
Sodium: 135 mmol/L (ref 135–145)
Sodium: 136 mmol/L (ref 135–145)
Sodium: 135 mmol/L (ref 135–145)
Sodium: 136 mmol/L (ref 135–145)

## 2019-08-21 LAB — CBC WITH DIFFERENTIAL/PLATELET
Abs Immature Granulocytes: 0.27 10*3/uL — ABNORMAL HIGH (ref 0.00–0.07)
Basophils Absolute: 0 10*3/uL (ref 0.0–0.1)
Basophils Relative: 0 %
Eosinophils Absolute: 0 10*3/uL (ref 0.0–0.5)
Eosinophils Relative: 0 %
HCT: 25.4 % — ABNORMAL LOW (ref 36.0–46.0)
Hemoglobin: 8.2 g/dL — ABNORMAL LOW (ref 12.0–15.0)
Immature Granulocytes: 1 %
Lymphocytes Relative: 6 %
Lymphs Abs: 1.3 10*3/uL (ref 0.7–4.0)
MCH: 32 pg (ref 26.0–34.0)
MCHC: 32.3 g/dL (ref 30.0–36.0)
MCV: 99.2 fL (ref 80.0–100.0)
Monocytes Absolute: 1.3 10*3/uL — ABNORMAL HIGH (ref 0.1–1.0)
Monocytes Relative: 6 %
Neutro Abs: 17.4 10*3/uL — ABNORMAL HIGH (ref 1.7–7.7)
Neutrophils Relative %: 87 %
Platelets: 106 10*3/uL — ABNORMAL LOW (ref 150–400)
RBC: 2.56 MIL/uL — ABNORMAL LOW (ref 3.87–5.11)
RDW: 15 % (ref 11.5–15.5)
WBC: 20.3 10*3/uL — ABNORMAL HIGH (ref 4.0–10.5)
nRBC: 0.2 % (ref 0.0–0.2)

## 2019-08-21 LAB — GLUCOSE, CAPILLARY
Glucose-Capillary: 110 mg/dL — ABNORMAL HIGH (ref 70–99)
Glucose-Capillary: 120 mg/dL — ABNORMAL HIGH (ref 70–99)
Glucose-Capillary: 143 mg/dL — ABNORMAL HIGH (ref 70–99)
Glucose-Capillary: 143 mg/dL — ABNORMAL HIGH (ref 70–99)
Glucose-Capillary: 147 mg/dL — ABNORMAL HIGH (ref 70–99)
Glucose-Capillary: 155 mg/dL — ABNORMAL HIGH (ref 70–99)

## 2019-08-21 LAB — HEMOGLOBIN AND HEMATOCRIT, BLOOD
HCT: 22.2 % — ABNORMAL LOW (ref 36.0–46.0)
Hemoglobin: 7.2 g/dL — ABNORMAL LOW (ref 12.0–15.0)

## 2019-08-21 LAB — MAGNESIUM: Magnesium: 2.1 mg/dL (ref 1.7–2.4)

## 2019-08-21 LAB — VANCOMYCIN, RANDOM: Vancomycin Rm: 15

## 2019-08-21 MED ORDER — AMIODARONE HCL IN DEXTROSE 360-4.14 MG/200ML-% IV SOLN
60.0000 mg/h | INTRAVENOUS | Status: AC
  Administered 2019-08-21 (×3): 60 mg/h via INTRAVENOUS
  Filled 2019-08-21: qty 200

## 2019-08-21 MED ORDER — DOCUSATE SODIUM 50 MG/5ML PO LIQD
100.0000 mg | Freq: Two times a day (BID) | ORAL | Status: DC
  Filled 2019-08-21: qty 10

## 2019-08-21 MED ORDER — SENNOSIDES 8.8 MG/5ML PO SYRP
10.0000 mL | ORAL_SOLUTION | Freq: Two times a day (BID) | ORAL | Status: DC
  Filled 2019-08-21 (×4): qty 10

## 2019-08-21 MED ORDER — LACTATED RINGERS IV BOLUS
1000.0000 mL | Freq: Once | INTRAVENOUS | Status: AC
  Administered 2019-08-21: 10:00:00 1000 mL via INTRAVENOUS

## 2019-08-21 MED ORDER — CLOTRIMAZOLE 10 MG MT TROC
10.0000 mg | Freq: Three times a day (TID) | OROMUCOSAL | Status: DC
  Filled 2019-08-21 (×5): qty 1

## 2019-08-21 MED ORDER — AMIODARONE LOAD VIA INFUSION
150.0000 mg | Freq: Once | INTRAVENOUS | Status: DC
  Filled 2019-08-21: qty 83.34

## 2019-08-21 MED ORDER — ORAL CARE MOUTH RINSE
15.0000 mL | Freq: Two times a day (BID) | OROMUCOSAL | Status: DC
  Administered 2019-08-21 (×2): 15 mL via OROMUCOSAL

## 2019-08-21 MED ORDER — ALTEPLASE 2 MG IJ SOLR
2.0000 mg | Freq: Once | INTRAMUSCULAR | Status: AC
  Administered 2019-08-21: 2 mg

## 2019-08-21 MED ORDER — DEXMEDETOMIDINE HCL IN NACL 400 MCG/100ML IV SOLN
0.4000 ug/kg/h | INTRAVENOUS | Status: DC
  Administered 2019-08-21: 0.6 ug/kg/h via INTRAVENOUS
  Filled 2019-08-21: qty 100

## 2019-08-21 MED ORDER — MORPHINE SULFATE (PF) 2 MG/ML IV SOLN
1.0000 mg | INTRAVENOUS | Status: DC | PRN
  Administered 2019-08-22 (×3): 2 mg via INTRAVENOUS
  Filled 2019-08-21 (×3): qty 1

## 2019-08-21 MED ORDER — AMIODARONE HCL IN DEXTROSE 360-4.14 MG/200ML-% IV SOLN
60.0000 mg/h | INTRAVENOUS | Status: DC
  Administered 2019-08-21: 30 mg/h via INTRAVENOUS
  Filled 2019-08-21 (×2): qty 200

## 2019-08-21 MED ORDER — AMIODARONE HCL IN DEXTROSE 360-4.14 MG/200ML-% IV SOLN
INTRAVENOUS | Status: AC
  Filled 2019-08-21: qty 200

## 2019-08-21 MED ORDER — METOPROLOL TARTRATE 5 MG/5ML IV SOLN
2.5000 mg | INTRAVENOUS | Status: AC
  Administered 2019-08-21: 2.5 mg via INTRAVENOUS
  Filled 2019-08-21: qty 5

## 2019-08-21 MED ORDER — STERILE WATER FOR INJECTION IJ SOLN
INTRAMUSCULAR | Status: AC
  Administered 2019-08-21: 23:00:00
  Filled 2019-08-21: qty 10

## 2019-08-21 MED ORDER — METOPROLOL TARTRATE 5 MG/5ML IV SOLN
2.5000 mg | Freq: Four times a day (QID) | INTRAVENOUS | Status: DC | PRN
  Administered 2019-08-22: 2.5 mg via INTRAVENOUS
  Filled 2019-08-21: qty 5

## 2019-08-21 MED ORDER — SENNOSIDES-DOCUSATE SODIUM 8.6-50 MG PO TABS
2.0000 | ORAL_TABLET | Freq: Two times a day (BID) | ORAL | Status: DC
  Administered 2019-08-21: 2
  Filled 2019-08-21: qty 2

## 2019-08-21 MED ORDER — MIDODRINE HCL 5 MG PO TABS
5.0000 mg | ORAL_TABLET | Freq: Three times a day (TID) | ORAL | Status: DC
  Administered 2019-08-21: 5 mg
  Filled 2019-08-21: qty 1

## 2019-08-21 MED ORDER — POLYETHYLENE GLYCOL 3350 17 G PO PACK
17.0000 g | PACK | Freq: Every day | ORAL | Status: DC
  Administered 2019-08-21: 17 g
  Filled 2019-08-21: qty 1

## 2019-08-21 MED ORDER — AMIODARONE IV BOLUS ONLY 150 MG/100ML
INTRAVENOUS | Status: AC
  Administered 2019-08-21: 150 mg
  Filled 2019-08-21: qty 100

## 2019-08-21 MED ORDER — LACTATED RINGERS IV BOLUS
1000.0000 mL | Freq: Once | INTRAVENOUS | Status: AC
  Administered 2019-08-21: 09:00:00 1000 mL via INTRAVENOUS

## 2019-08-21 MED ORDER — MORPHINE SULFATE (PF) 2 MG/ML IV SOLN
INTRAVENOUS | Status: AC
  Administered 2019-08-21: 23:00:00 2 mg via INTRAMUSCULAR
  Filled 2019-08-21: qty 1

## 2019-08-21 NOTE — Progress Notes (Signed)
Pt observed to be tachycardic, vent stacking, and hypertensive at approx 0600. NP notified and orders placed for precedex gtt. Precedex was effective in calming patient but tachycardia was refractory, NP notified and 1x order for 1.5 metoprolol placed. Patient became hypotensive shortly after beta blocker admin and neosynephrine was restarted. Will continue to monitor.

## 2019-08-21 NOTE — Progress Notes (Signed)
Filter clotted in crrt machine, disconnected, tried to give blood back to no avail. Discarded set and will set up new.

## 2019-08-21 NOTE — Consult Note (Signed)
Consultation Note Date: 08/21/2019   Patient Name: Kimberly Shaffer  DOB: 1950-12-30  MRN: [id number]  Age / Sex: 68 y.o., female  PCP: [facility] Referring Physician: Ottie Glazier, MD  Reason for Consultation: Establishing goals of care  HPI/Patient Profile: SHEVETTE BESS is a 68 y.o. female with past medical history of CAD status post CABG, hypertension, hyperlipidemia, diabetes, COPD on 2L [place], and ESRD on HD who per EMR presented to the ED complaining of shortness of breath and generalized weakness.  Patient unable to speak, and unable to reach daughter. Per EMR, patient reported she has been feeling generally weak with diffuse abdominal discomfort over the past couple of days.  She also developed a nonproductive cough and started feeling very short of breath overnight.  She eventually called EMS earlier today for feeling short of breath.  She chronically wears 2 L nasal cannula for COPD but states her breathing has been worse than usual.  She last received dialysis 3 days ago and was due to undergo treatment earlier today, but was unable to make it to her appointment.   Clinical Assessment and Goals of Care: Patient is resting in bed. She remains on the ventilator, and answers questions this morning with nods and shakes of her head. She attempts to speak briefly with tube, but unable to understand her. She shakes her head no that she is not having pain.  She nods that she would want the ETT replaced and to be put back on the ventilator if she does not do well after extubation. She nods that she would want CPR after giving the question some thought. She nods that she would want to continue dialysis as she has been. When asked if she knew how long she would want to be on a ventilator if it had to be replaced, she shook her head no. She did not answer when asked if she would want to live on a ventilator  if it were needed to sustain her. Attempted to call her daughter as arranged unsuccessfully.       SUMMARY OF RECOMMENDATIONS   Full code/full scope. Palliative will continue to follow and reach out to patient/ family as needed.   Prognosis:   Unable to determine  Discharge Planning: To Be Determined      Primary Diagnoses: Present on Admission: . Respiratory failure ([place])   I have reviewed the medical record, interviewed the patient and family, and examined the patient. The following aspects are pertinent.  Past Medical History:  Diagnosis Date  . Asthma   . CAD (coronary artery disease)   . Chronic lower back pain   . COPD (chronic obstructive pulmonary disease) ([place])   . Depression   . Diabetes mellitus without complication (HCC)    NIDD  . H/O angina pectoris   . H/O blood clots   . Hypercholesteremia   . Hypertension   . Left rotator cuff tear   . Myocardial infarction ([place])   . Obesity   . Renal insufficiency   .  Stroke ([place])   . Vertigo, aural    Social History   Socioeconomic History  . Marital status: Widowed    Spouse name: Not on file  . Number of children: Not on file  . Years of education: Not on file  . Highest education level: Not on file  Occupational History  . Occupation: retired  Scientific laboratory technician  . Financial resource strain: Not on file  . Food insecurity    Worry: Not on file    Inability: Not on file  . Transportation needs    Medical: Not on file    Non-medical: Not on file  Tobacco Use  . Smoking status: Current Every Day Smoker    Packs/day: 0.25    Types: Cigarettes  . Smokeless tobacco: Never Used  Substance and Sexual Activity  . Alcohol use: No    Alcohol/week: 0.0 standard drinks  . Drug use: No  . Sexual activity: Yes  Lifestyle  . Physical activity    Days per week: Not on file    Minutes per session: Not on file  . Stress: Not on file  Relationships  . Social Herbalist on phone: Not on file    Gets  together: Not on file    Attends religious service: Not on file    Active member of club or organization: Not on file    Attends meetings of clubs or organizations: Not on file    Relationship status: Not on file  Other Topics Concern  . Not on file  Social History Narrative  . Not on file   Family History  Problem Relation Age of Onset  . Cancer Mother   . Cancer Father   . Diabetes Brother   . Heart disease Brother    Scheduled Meds: . amiodarone  150 mg Intravenous Once  . chlorhexidine gluconate (MEDLINE KIT)  15 mL Mouth Rinse BID  . Chlorhexidine Gluconate Cloth  6 each Topical Q0600  . docusate  100 mg Per Tube BID  . epoetin (EPOGEN/PROCRIT) injection  10,000 Units Subcutaneous Q T,Th,Sa-HD  . feeding supplement (PRO-STAT SUGAR FREE 64)  30 mL Per Tube TID  . feeding supplement (VITAL HIGH PROTEIN)  1,000 mL Per Tube Q24H  . heparin injection (subcutaneous)  5,000 Units Subcutaneous Q12H  . hydrocortisone sod succinate (SOLU-CORTEF) inj  100 mg Intravenous Q12H  . mouth rinse  15 mL Mouth Rinse 10 times per day  . midodrine  5 mg Per Tube TID WC  . pantoprazole (PROTONIX) IV  40 mg Intravenous BID  . polyethylene glycol  17 g Per Tube Daily  . sennosides  10 mL Per Tube BID  . sodium chloride flush  3 mL Intravenous Q12H   Continuous Infusions: . sodium chloride 250 mL (08/19/19 1927)  . sodium chloride    . amiodarone 60 mg/hr (08/21/19 0800)   Followed by  . amiodarone    . dexmedetomidine (PRECEDEX) IV infusion Stopped (08/21/19 8921)  . fluconazole (DIFLUCAN) IV Stopped (08/20/19 2304)  . phenylephrine (NEO-SYNEPHRINE) Adult infusion 50 mcg/min (08/21/19 0745)  . prismasol BGK 2/2.5 dialysis solution 2,000 mL/hr at 08/21/19 0358  . prismasol BGK 2/2.5 replacement solution 200 mL/hr at 08/20/19 1222  . prismasol BGK 2/2.5 replacement solution 200 mL/hr at 08/20/19 1222  . vancomycin 1,000 mg (08/20/19 2001)   PRN Meds:.sodium chloride, acetaminophen,  heparin, heparin, sodium chloride, sodium chloride flush Medications Prior to Admission:  Prior to Admission medications   Medication Sig Start  Date End Date Taking? Authorizing Provider  amLODipine (NORVASC) 10 MG tablet Take 10 mg by mouth daily.   Yes [provider]  aspirin 81 MG chewable tablet Chew 81 mg by mouth daily.   Yes [provider]  atorvastatin (LIPITOR) 80 MG tablet Take 80 mg by mouth at bedtime.  08/05/19  Yes [provider]  cetirizine (ZYRTEC) 10 MG tablet Take 10 mg by mouth daily.   Yes [provider]  cinacalcet (SENSIPAR) 30 MG tablet Take 1 tablet (30 mg total) by mouth daily before breakfast. 05/31/18  Yes Awilda Bill, NP  clopidogrel (PLAVIX) 75 MG tablet Take 1 tablet (75 mg total) by mouth daily. 05/31/18  Yes Awilda Bill, NP  fluticasone (FLONASE) 50 MCG/ACT nasal spray Place 2 sprays into both nostrils 2 (two) times daily.   Yes [provider]  fluticasone furoate-vilanterol (BREO ELLIPTA) 200-25 MCG/INH AEPB Inhale 1 puff into the lungs daily. 05/31/18  Yes Awilda Bill, NP  folic acid (FOLVITE) 1 MG tablet Take 1 mg by mouth daily.   Yes [provider]  isosorbide mononitrate (IMDUR) 30 MG 24 hr tablet Take 30 mg by mouth daily.   Yes [provider]  meclizine (ANTIVERT) 12.5 MG tablet Take 12.5 mg by mouth 3 (three) times daily as needed for dizziness.    Yes [provider]  metoprolol succinate (TOPROL-XL) 100 MG 24 hr tablet Take 1 tablet (100 mg total) by mouth daily with breakfast. Take with or immediately following a meal. 05/31/18  Yes Awilda Bill, NP  mirtazapine (REMERON) 7.5 MG tablet Take 7.5 mg by mouth at bedtime. 07/31/19  Yes [provider]  multivitamin (RENA-VIT) TABS tablet Take 1 tablet by mouth at bedtime. 05/30/18  Yes Awilda Bill, NP  nitroGLYCERIN (NITROLINGUAL) 0.4 MG/SPRAY spray Place 1 spray under the tongue as needed. 08/09/19  08/08/20 Yes [provider]  Oxycodone HCl 10 MG TABS Take 10 mg by mouth See admin instructions. Take 1 tablet ('10mg'$ ) by mouth 4 to 5 times daily as needed for severe pain   Yes [provider]  pantoprazole (PROTONIX) 40 MG tablet Take 40 mg by mouth 2 (two) times daily.   Yes [provider]  ranolazine (RANEXA) 1000 MG SR tablet Take 1 tablet (1,000 mg total) by mouth 2 (two) times daily. 08/07/19  Yes Fritzi Mandes, MD  sevelamer carbonate (RENVELA) 800 MG tablet Take 2,400 mg by mouth 3 (three) times daily with meals.    Yes [provider]  torsemide (DEMADEX) 100 MG tablet Take 100 mg by mouth daily.   Yes [provider]  acetaminophen (TYLENOL) 325 MG tablet Take 2 tablets (650 mg total) by mouth every 6 (six) hours as needed for mild pain (or Fever >/= 101). 05/30/18   Awilda Bill, NP  albuterol (PROVENTIL) (2.5 MG/3ML) 0.083% nebulizer solution Take 3 mLs (2.5 mg total) by nebulization every 4 (four) hours as needed for wheezing or shortness of breath. 05/30/18   Awilda Bill, NP  albuterol (VENTOLIN HFA) 108 (90 Base) MCG/ACT inhaler Inhale 1 puff into the lungs every 6 (six) hours as needed for wheezing or shortness of breath.    [provider]  calcium carbonate (TUMS - DOSED IN MG ELEMENTAL CALCIUM) 500 MG chewable tablet Chew 2 tablets by mouth 3 (three) times daily.    [provider]  lidocaine-prilocaine (EMLA) cream Apply 1 application topically as needed (topical anesthesia for hemodialysis  if Gebauers and Lidocaine injection are ineffective.). 05/30/18   Awilda Bill, NP  Nutritional Supplements (FEEDING SUPPLEMENT, NEPRO CARB STEADY,) LIQD Take 237 mLs by mouth daily. 08/07/19   Fritzi Mandes, MD   Allergies  Allergen Reactions  . Nystatin Hives and Itching  . Sulfa Antibiotics Swelling, Hives and Rash  . Niaspan [Niacin] Other (See Comments) and Hives   Review of Systems  Unable to perform ROS    Physical Exam Pulmonary:     Comments: On ventilator.  Neurological:     Mental Status: She is alert.     Vital Signs: BP 96/61   Pulse 91   Temp 98.2 F (36.8 C) (Axillary)   Resp (!) 24   Ht 5' 6" (1.676 m)   Wt 93.7 kg   SpO2 100%   BMI 33.34 kg/m  Pain Scale: CPOT   Pain Score: 1    SpO2: SpO2: 100 % O2 Device:SpO2: 100 % O2 Flow Rate: .   IO: Intake/output summary:   Intake/Output Summary (Last 24 hours) at 08/21/2019 1043 Last data filed at 08/21/2019 0800 Gross per 24 hour  Intake 598.21 ml  Output 1347 ml  Net -748.79 ml    LBM: Last BM Date: (PTA) Baseline Weight: Weight: 101 kg Most recent weight: Weight: 93.7 kg     Palliative Assessment/Data:     Time In: 9:40 Time Out: 10:30 Time Total: 50 min Greater than 50%  of this time was spent counseling and coordinating care related to the above assessment and plan.  Signed by: Asencion Gowda, NP   Please contact Palliative Medicine Team phone at [phone] for questions and concerns.  For individual provider: See Shea Evans

## 2019-08-21 NOTE — Progress Notes (Signed)
Restarted CRRT machine at this time.

## 2019-08-21 NOTE — Consult Note (Signed)
Pharmacy Antibiotic Note  Kimberly Shaffer is a 68 y.o. female admitted on 08/09/2019 with pneumonia.  Pharmacy has been consulted for Vancomycin dosing. Respiratory culture positive for Staphylococcus Aureus.  Currently on ventilator. Patient receives scheduled dialysis on Tuesday, Thursday, and Saturdays. Patient being transitioned to CRRT on 11/30.   Plan: Continue vancomycin 1g IV Q24hr while patient receiving CRRT. Trough level was 15 within goal range. Goal trough 15-25.   Height: 5\' 6"  (167.6 cm) Weight: 206 lb 9.1 oz (93.7 kg) IBW/kg (Calculated) : 59.3  Temp (24hrs), Avg:98.7 F (37.1 C), Min:98.2 F (36.8 C), Max:99.2 F (37.3 C)  Recent Labs  Lab 08/16/19 0518  08/18/19 0454 08/19/19 0552  08/20/19 0534  08/21/19 0126 08/21/19 0542 08/21/19 1017 08/21/19 1318 08/21/19 1842  WBC 10.6*  --  15.6* 16.2*  --  23.5*  --   --  20.3*  --   --   --   CREATININE 4.09*   < > 4.09* 6.31*   < > 7.10*   < > 3.27* 2.92* 2.83* 2.52* 2.20*  LATICACIDVEN  --   --   --  2.2*  --   --   --   --   --   --   --   --   VANCORANDOM  --   --   --   --   --   --   --   --   --   --   --  15   < > = values in this interval not displayed.    Estimated Creatinine Clearance: 28.2 mL/min (A) (by C-G formula based on SCr of 2.2 mg/dL (H)).    Allergies  Allergen Reactions  . Nystatin Hives and Itching  . Sulfa Antibiotics Swelling, Hives and Rash  . Niaspan [Niacin] Other (See Comments) and Hives    Antimicrobials this admission: Ceftriaxone 11/24 x 1 Vancomycin 11/28 >>   Microbiology results: 11/28 BCx: no growth x 5 days  11/24 UCx: 20k Staph Aureus  11/27 Tracheal Aspirate: MRSA   Thank you for allowing pharmacy to be a part of this patient's care.  Oswald Hillock 08/21/2019 8:13 PM

## 2019-08-21 NOTE — Progress Notes (Signed)
CRITICAL CARE PROGRESS NOTE    Name: SOMA BACHAND MRN: [id number] DOB: [birth year]     LOS: 8   SUBJECTIVE FINDINGS & SIGNIFICANT EVENTS   Patient description:   Kimberly Shaffer is an 68 y.o. femalewith past medical history of CAD status post CABG, hypertension, hyperlipidemia, diabetes, COPDon 2L [place], and ESRD on HD who presents to the ED complaining of shortness of breath and generalized weakness -patient with severe hypoxia progressive resp failure placed on BiPAP She has ESRD on HD last HD was Saturday Failed BiPAP and now intubated and placed on MV support  Lines / Drains: PIVx3  Cultures / Sepsis markers: Tracheal aspirate MRSA + Urine Cx - Staph aureus - 20K  Antibiotics: Vancomycin   Protocols / Consultants: Nephrology  Critical care    Tests / Events: CRRT   12/1 -Remains critically ill with septic shock, failed SBT 12/2 - Tachycardia this am, patient had high volume removal via CRRT (1.6L) documented incorrectly as net even, she also lost significant blood after this due to device malfunction and filter replacement X2 so overall tachycardia may be due to hypovolemia with intercurrent distributive shock physiology, will infuse LR 1L bolus to evaluate for fluid responsiveness. Patient has passed weaning trial and has been successfully liberated from mechanical ventilation.    PAST MEDICAL HISTORY   Past Medical History:  Diagnosis Date  . Asthma   . CAD (coronary artery disease)   . Chronic lower back pain   . COPD (chronic obstructive pulmonary disease) ([place])   . Depression   . Diabetes mellitus without complication (HCC)    NIDD  . H/O angina pectoris   . H/O blood clots   . Hypercholesteremia   . Hypertension   . Left rotator cuff tear   . Myocardial infarction ([place])   .  Obesity   . Renal insufficiency   . Stroke ([place])   . Vertigo, aural      SURGICAL HISTORY   Past Surgical History:  Procedure Laterality Date  . A/V FISTULAGRAM Left 03/14/2017   Procedure: A/V Fistulagram;  Surgeon: Katha Cabal, MD;  Location: [place] [facility];  Service: Cardiovascular;  Laterality: Left;  . AV FISTULA PLACEMENT Left 11-04-2015  . AV FISTULA PLACEMENT Left 11/04/2015   Procedure: ARTERIOVENOUS (AV) FISTULA CREATION  ( BRACHIAL CEPHALIC );  Surgeon: Katha Cabal, MD;  Location: [facility];  Service: Vascular;  Laterality: Left;  . CARDIAC CATHETERIZATION    . CARDIAC CATHETERIZATION Left 07/19/2016   Procedure: Left Heart Cath and Coronary Angiography;  Surgeon: Corey Skains, MD;  Location: [place];  Service: Cardiovascular;  Laterality: Left;  . CORONARY ARTERY BYPASS GRAFT  2012  . CORONARY STENT PLACEMENT    . JOINT REPLACEMENT    . KNEE SURGERY Bilateral   . LEFT HEART CATH AND CORONARY ANGIOGRAPHY N/A 12/25/2017   Procedure: LEFT HEART CATH AND CORONARY ANGIOGRAPHY;  Surgeon: Teodoro Spray, MD;  Location: [place] [facility];  Service: Cardiovascular;  Laterality: N/A;  . LEFT HEART CATH AND CORONARY ANGIOGRAPHY Right 03/23/2018   Procedure: LEFT HEART CATH AND CORONARY ANGIOGRAPHY;  Surgeon: Dionisio David, MD;  Location: [place] [facility];  Service: Cardiovascular;  Laterality: Right;  . LEFT HEART CATH AND CORONARY ANGIOGRAPHY N/A 05/30/2018   Procedure: LEFT HEART CATH AND CORONARY ANGIOGRAPHY;  Surgeon: Corey Skains, MD;  Location: [place] [facility];  Service: Cardiovascular;  Laterality: N/A;  . PERIPHERAL VASCULAR CATHETERIZATION  N/A 11/13/2015   Procedure: Dialysis/Perma Catheter Insertion;  Surgeon: Katha Cabal, MD;  Location: [place] [facility];  Service: Cardiovascular;  Laterality: N/A;  . PERIPHERAL VASCULAR CATHETERIZATION N/A 12/04/2015   Procedure: Dialysis/Perma Catheter Insertion;  Surgeon: Katha Cabal, MD;  Location: [place] [facility];  Service: Cardiovascular;  Laterality: N/A;  . PERIPHERAL VASCULAR CATHETERIZATION Left 12/31/2015   Procedure: A/V Shuntogram/Fistulagram;  Surgeon: Algernon Huxley, MD;  Location: [place] [facility];  Service: Cardiovascular;  Laterality: Left;  . PERIPHERAL VASCULAR CATHETERIZATION N/A 12/31/2015   Procedure: A/V Shunt Intervention;  Surgeon: Algernon Huxley, MD;  Location: [place] [facility];  Service: Cardiovascular;  Laterality: N/A;  . PERIPHERAL VASCULAR CATHETERIZATION Left 02/25/2016   Procedure: A/V Shuntogram/Fistulagram;  Surgeon: Algernon Huxley, MD;  Location: [place] [facility];  Service: Cardiovascular;  Laterality: Left;  . PERIPHERAL VASCULAR CATHETERIZATION N/A 03/18/2016   Procedure: Dialysis/Perma Catheter Removal;  Surgeon: Katha Cabal, MD;  Location: [place] [facility];  Service: Cardiovascular;  Laterality: N/A;  . REMOVAL OF A DIALYSIS CATHETER N/A 06/30/2017   Procedure: REMOVAL OF A DIALYSIS CATHETER;  Surgeon: Katha Cabal, MD;  Location: [facility];  Service: Vascular;  Laterality: N/A;  . REPLACEMENT TOTAL KNEE BILATERAL Bilateral      FAMILY HISTORY   Family History  Problem Relation Age of Onset  . Cancer Mother   . Cancer Father   . Diabetes Brother   . Heart disease Brother      SOCIAL HISTORY   Social History   Tobacco Use  . Smoking status: Current Every Day Smoker    Packs/day: 0.25    Types: Cigarettes  . Smokeless tobacco: Never Used  Substance Use Topics  . Alcohol use: No    Alcohol/week: 0.0 standard drinks  . Drug use: No     MEDICATIONS   Current Medication:  Current Facility-Administered Medications:  .  0.9 %  sodium chloride infusion, 250 mL, Intravenous, PRN, Flora Lipps, MD, Last Rate: 5 mL/hr at 08/19/19 1927, 250 mL at 08/19/19 1927 .  0.9 %  sodium chloride infusion, 250 mL, Intravenous, Continuous, Nyaja [place], MD .  acetaminophen (TYLENOL) tablet 650 mg, 650 mg,  Oral, Q4H PRN, Flora Lipps, MD, 650 mg at 08/19/19 0752 .  amiodarone (NEXTERONE) 1.8 mg/mL load via infusion 150 mg, 150 mg, Intravenous, Once **FOLLOWED BY** amiodarone (NEXTERONE PREMIX) 360-4.14 MG/200ML-% (1.8 mg/mL) IV infusion, 60 mg/hr, Intravenous, Continuous, Last Rate: 33.3 mL/hr at 08/21/19 0800, 60 mg/hr at 08/21/19 0800 **FOLLOWED BY** amiodarone (NEXTERONE PREMIX) 360-4.14 MG/200ML-% (1.8 mg/mL) IV infusion, 30 mg/hr, Intravenous, Continuous, Blakeney, Dana G, NP .  chlorhexidine gluconate (MEDLINE KIT) (PERIDEX) 0.12 % solution 15 mL, 15 mL, Mouth Rinse, BID, Kasa, Kurian, MD, 15 mL at 08/21/19 0824 .  Chlorhexidine Gluconate Cloth 2 % PADS 6 each, 6 each, Topical, Q0600, Flora Lipps, MD, 6 each at 08/21/19 0505 .  dexmedetomidine (PRECEDEX) 400 MCG/100ML (4 mcg/mL) infusion, 0.4-1.2 mcg/kg/hr, Intravenous, Titrated, Awilda Bill, NP, Stopped at 08/21/19 [phone] .  docusate (COLACE) 50 MG/5ML liquid 100 mg, 100 mg, Per Tube, Daily, Awilda Bill, NP, 100 mg at 08/20/19 2059 .  epoetin alfa (EPOGEN) injection 10,000 Units, 10,000 Units, Subcutaneous, Q T,Th,Sa-HD, Kolluru, Sarath, MD .  feeding supplement (PRO-STAT SUGAR FREE 64) liquid 30 mL, 30 mL, Per Tube, TID, Tyler Pita, MD, 30 mL at 08/20/19 2114 .  feeding supplement (VITAL HIGH PROTEIN) liquid 1,000 mL, 1,000 mL, Per  Tube, Q24H, Tyler Pita, MD, 1,000 mL at 08/20/19 1031 .  fluconazole (DIFLUCAN) IVPB 200 mg, 200 mg, Intravenous, Q24H, Benita Gutter, RPH, Stopped at 08/20/19 2304 .  heparin injection 1,000-6,000 Units, 1,000-6,000 Units, Intravenous, PRN, Awilda Bill, NP .  heparin injection 1,000-6,000 Units, 1,000-6,000 Units, CRRT, PRN, Kolluru, Sarath, MD, 3,000 Units at 08/20/19 0457 .  heparin injection 5,000 Units, 5,000 Units, Subcutaneous, Q12H, Tyler Pita, MD, 5,000 Units at 08/20/19 2100 .  hydrocortisone sodium succinate (SOLU-CORTEF) 100 MG injection 100 mg, 100 mg, Intravenous,  Q12H, Lanney Gins, Addilynne Olheiser, MD, 100 mg at 08/21/19 0156 .  MEDLINE mouth rinse, 15 mL, Mouth Rinse, 10 times per day, Flora Lipps, MD, 15 mL at 08/21/19 0505 .  pantoprazole (PROTONIX) injection 40 mg, 40 mg, Intravenous, BID, Tyler Pita, MD, 40 mg at 08/20/19 2100 .  phenylephrine (NEO-SYNEPHRINE) 40 mg in sodium chloride 0.9 % 250 mL (0.16 mg/mL) infusion, 0-400 mcg/min, Intravenous, Titrated, Blakeney, Dreama Saa, NP, Last Rate: 18.75 mL/hr at 08/21/19 0745, 50 mcg/min at 08/21/19 0745 .  prismasol BGK 2/2.5 dialysis solution, , CRRT, Continuous, Kolluru, Sarath, MD, Last Rate: 2,000 mL/hr at 08/21/19 0358 .  prismasol BGK 2/2.5 replacement solution, , CRRT, Continuous, Kolluru, Sarath, MD, Last Rate: 200 mL/hr at 08/20/19 1222 .  prismasol BGK 2/2.5 replacement solution, , CRRT, Continuous, Kolluru, Sarath, MD, Last Rate: 200 mL/hr at 08/20/19 1222 .  sennosides (SENOKOT) 8.8 MG/5ML syrup 5 mL, 5 mL, Per Tube, BID, Awilda Bill, NP, 5 mL at 08/20/19 2059 .  sodium chloride 0.9 % primer fluid for CRRT, , CRRT, PRN, Kolluru, Sarath, MD .  sodium chloride flush (NS) 0.9 % injection 3 mL, 3 mL, Intravenous, Q12H, Kasa, Kurian, MD, 3 mL at 08/20/19 2101 .  sodium chloride flush (NS) 0.9 % injection 3 mL, 3 mL, Intravenous, PRN, Flora Lipps, MD .  vancomycin (VANCOCIN) IVPB 1000 mg/200 mL premix, 1,000 mg, Intravenous, Q24H, [name], RPH, Last Rate: 200 mL/hr at 08/20/19 2001, 1,000 mg at 08/20/19 2001    ALLERGIES   Nystatin, Sulfa antibiotics, and Niaspan [niacin]    REVIEW OF SYSTEMS     Unable to obtain while critically ill on mechanical ventilation  PHYSICAL EXAMINATION   Vital Signs: Temp:  [98 F (36.7 C)-99.2 F (37.3 C)] 98.2 F (36.8 C) (12/02 0715) Pulse Rate:  [91] 91 (12/01 1921) Resp:  [19-28] 23 (12/02 0730) BP: (62-182)/(49-80) 96/67 (12/02 0730) SpO2:  [94 %-100 %] 100 % (12/02 0820) FiO2 (%):  [28 %] 28 % (12/02 0820) Weight:  [93.7 kg] 93.7 kg  (12/02 0500)  GENERAL:age appropriate , sedation stopped HEAD: Normocephalic, atraumatic.  EYES: Pupils equal, round, reactive to light.  No scleral icterus.  MOUTH: Moist mucosal membrane. NECK: Supple. No thyromegaly. No nodules. No JVD.  PULMONARY: MV breath sounds without rhonchi CARDIOVASCULAR: S1 and S2. Regular rate and rhythm. No murmurs, rubs, or gallops.  GASTROINTESTINAL: Soft, nontender, non-distended. No masses. Positive bowel sounds. No hepatosplenomegaly.  MUSCULOSKELETAL: No swelling, clubbing, or edema.  NEUROLOGIC: sedated on MV SKIN:intact,warm,dry   PERTINENT DATA     Infusions: . sodium chloride 250 mL (08/19/19 1927)  . sodium chloride    . amiodarone 60 mg/hr (08/21/19 0800)   Followed by  . amiodarone    . dexmedetomidine (PRECEDEX) IV infusion Stopped (08/21/19 7564)  . fluconazole (DIFLUCAN) IV Stopped (08/20/19 2304)  . phenylephrine (NEO-SYNEPHRINE) Adult infusion 50 mcg/min (08/21/19 0745)  . prismasol BGK 2/2.5 dialysis solution  2,000 mL/hr at 08/21/19 0358  . prismasol BGK 2/2.5 replacement solution 200 mL/hr at 08/20/19 1222  . prismasol BGK 2/2.5 replacement solution 200 mL/hr at 08/20/19 1222  . vancomycin 1,000 mg (08/20/19 2001)   Scheduled Medications: . amiodarone  150 mg Intravenous Once  . chlorhexidine gluconate (MEDLINE KIT)  15 mL Mouth Rinse BID  . Chlorhexidine Gluconate Cloth  6 each Topical Q0600  . docusate  100 mg Per Tube Daily  . epoetin (EPOGEN/PROCRIT) injection  10,000 Units Subcutaneous Q T,Th,Sa-HD  . feeding supplement (PRO-STAT SUGAR FREE 64)  30 mL Per Tube TID  . feeding supplement (VITAL HIGH PROTEIN)  1,000 mL Per Tube Q24H  . heparin injection (subcutaneous)  5,000 Units Subcutaneous Q12H  . hydrocortisone sod succinate (SOLU-CORTEF) inj  100 mg Intravenous Q12H  . mouth rinse  15 mL Mouth Rinse 10 times per day  . pantoprazole (PROTONIX) IV  40 mg Intravenous BID  . sennosides  5 mL Per Tube BID  . sodium  chloride flush  3 mL Intravenous Q12H   PRN Medications: sodium chloride, acetaminophen, heparin, heparin, sodium chloride, sodium chloride flush Hemodynamic parameters:   Intake/Output: 12/01 0701 - 12/02 0700 In: 1159.7 [I.V.:59.7; NG/GT:1000; IV Piggyback:100] Out: 2334   Ventilator  Settings: Vent Mode: PRVC FiO2 (%):  [28 %] 28 % Set Rate:  [23 bmp] 23 bmp Vt Set:  [530 mL] 530 mL PEEP:  [5 cmH20] 5 cmH20 Pressure Support:  [8 cmH20] 8 cmH20 Plateau Pressure:  [10.5 DHW86-16 cmH20] 19 cmH20    LAB RESULTS:  Basic Metabolic Panel: Recent Labs  Lab 08/15/19 0620  08/17/19 0504  08/19/19 0552  08/20/19 0534  08/20/19 1320 08/20/19 1734 08/20/19 2125 08/21/19 0126 08/21/19 0542  NA 137   < > 136   < > 139   < > 136   < > 137 135 137 137 137  K 3.8   < > 3.7   < > 4.5   < > 4.8   < > 4.2 4.2 4.1 3.8 4.0  CL 96*   < > 93*   < > 96*   < > 97*   < > 101 98 100 99 100  CO2 22   < > 23   < > 23   < > 22   < > 21* _0 GLUCOSE 98   < > 99   < > 157*   < > 176*   < > 173* 153* 178* 140* 159*  BUN 32*   < > 56*   < > 78*   < > 102*   < > 85* 73* 68* 60* 56*  CREATININE 6.32*   < > 6.26*   < > 6.31*   < > 7.10*   < > 5.50* 4.40* 3.75* 3.27* 2.92*  CALCIUM 7.9*   < > 8.6*   < > 8.7*   < > 8.6*   < > 8.6* 8.8* 9.1 9.0 9.3  MG 1.9  --  2.0  --  2.1  --  2.1  --   --   --   --   --  2.1  PHOS 6.5*   < > 7.2*   < > 5.7*   < > 5.5*   < > 5.1* 4.0 3.5 3.1 2.9   < > = values in this interval not displayed.   Liver Function Tests: Recent Labs  Lab 08/17/19 0504  08/20/19 1320 08/20/19 1734 08/20/19  2125 08/21/19 0126 08/21/19 0542  AST 108*  --   --   --   --   --   --   ALT 60*  --   --   --   --   --   --   ALKPHOS 73  --   --   --   --   --   --   BILITOT 1.0  --   --   --   --   --   --   PROT 7.4  --   --   --   --   --   --   ALBUMIN 3.2*   < > 2.7* 2.5* 2.7* 2.7* 2.8*   < > = values in this interval not displayed.   No results for input(s): LIPASE, AMYLASE  in the last 168 hours. No results for input(s): AMMONIA in the last 168 hours. CBC: Recent Labs  Lab 08/15/19 0620 08/16/19 0518 08/18/19 0454 08/19/19 0552 08/20/19 0534 08/21/19 0542  WBC 8.8 10.6* 15.6* 16.2* 23.5* 20.3*  NEUTROABS 6.9  --  12.4*  --  20.2* 17.4*  HGB 7.6* 7.8* 8.6* 9.3* 8.6* 8.2*  HCT 24.3* 24.9* 27.7* 29.2* 26.6* 25.4*  MCV 102.1* 100.4* 103.0* 100.0 98.2 99.2  PLT 209 196 180 168 137* 106*   Cardiac Enzymes: No results for input(s): CKTOTAL, CKMB, CKMBINDEX, TROPONINI in the last 168 hours. BNP: Invalid input(s): POCBNP CBG: Recent Labs  Lab 08/20/19 1105 08/20/19 1931 08/20/19 2359 08/21/19 0406 08/21/19 0724  GLUCAP 152* 144* 143* 143* 155*     IMAGING RESULTS:  Imaging: Dg Chest Port 1 View  Result Date: 08/19/2019 CLINICAL DATA:  68 year old female with central line placement. EXAM: PORTABLE CHEST 1 VIEW COMPARISON:  Earlier radiograph dated 08/19/2019. FINDINGS: Endotracheal tube above the carina and enteric tube extending below the diaphragm with tip beyond the inferior margin of the image. Bilateral hazy and streaky densities as seen previously most consistent with infiltrate. No large pleural effusion or pneumothorax. Stable cardiac silhouette. Atherosclerotic calcification of the aorta. No acute osseous pathology. IMPRESSION: 1. Endotracheal tube above the carina and enteric tube with tip beyond the inferior margin of the image. 2. No significant interval change in the appearance of the lungs. Electronically Signed   By: Anner Crete M.D.   On: 08/19/2019 20:55      ASSESSMENT AND PLAN    -Multidisciplinary rounds held today  Acute Hypoxic Respiratory Failure -etiology is multifactorial including  -MRSA pneumonia, acute on chronic HFrEF, bibasilar atelectasis, fluid overload due to ESRD.  -continue Vancomycin per pharmD -treat atelectasis with MetaNEB while on ventilator and recruitment maneuvers via ventilator -ESRD will start  CRRT   Acute on chronic exacerbation of systolic CHF with EF 63-01% - diuresis has been difficult with severe renal failure -will continue CRRT ICU monitoring  ESRD -s/p evaluation by nephrology - patient not able to tolerate HD and requires CRRT as per renal team -Epogen 10K ongoing  -follow chem 7 -follow UO -continue Foley Catheter-assess need daily      Septic shock  - due to pneumonia with MRSA  -use vasopressors to keep MAP>65-currently on Neosynephrine -follow ABG and LA -Vancomycin IV per pharmacy -consider stress dose steroids-Solucortef -100BID   ID -continue IV abx as prescibed -follow up cultures  GI/Nutrition GI PROPHYLAXIS as indicated-protonix 40 IV DIET-->TF's as tolerated Constipation protocol as indicated  ENDO - ICU hypoglycemic\Hyperglycemia protocol -check FSBS per protocol   ELECTROLYTES -follow labs as needed -  replace as needed -pharmacy consultation   DVT/GI PRX ordered -heparin 5K q12h TRANSFUSIONS AS NEEDED MONITOR FSBS ASSESS the need for LABS as needed   Critical care provider statement:    Critical care time (minutes):  33   Critical care time was exclusive of:  Separately billable procedures and treating other patients   Critical care was necessary to treat or prevent imminent or life-threatening deterioration of the following conditions:  Septic shock, MRSA pneumonia, ESRD, acute hypoxemic respiratory failure, multiple comorbid conditions   Critical care was time spent personally by me on the following activities:  Development of treatment plan with patient or surrogate, discussions with consultants, evaluation of patient's response to treatment, examination of patient, obtaining history from patient or surrogate, ordering and performing treatments and interventions, ordering and review of laboratory studies and re-evaluation of patient's condition.  I assumed direction of critical care for this patient from another provider in  my specialty: no    This document was prepared using Dragon voice recognition software and may include unintentional dictation errors.    Ottie Glazier, M.D.  Division of [facility]

## 2019-08-21 NOTE — Progress Notes (Signed)
Extubated onto 3L [place] 

## 2019-08-21 NOTE — Progress Notes (Signed)
[facility]  ROUNDING NOTE   Subjective:   Examined this morning. On CRRT. Issues with CRRT this morning and overnight.   Objective:  Vital signs in last 24 hours:  Temp:  [98.2 F (36.8 C)-99.2 F (37.3 C)] 98.4 F (36.9 C) (12/02 1100) Pulse Rate:  [91-128] 128 (12/02 1300) Resp:  [19-31] 31 (12/02 1300) BP: (62-147)/(49-80) 98/58 (12/02 1300) SpO2:  [94 %-100 %] 94 % (12/02 1300) FiO2 (%):  [28 %] 28 % (12/02 1111) Weight:  [93.7 kg] 93.7 kg (12/02 0500)  Weight change: -3 kg Filed Weights   08/20/19 0050 08/20/19 0500 08/21/19 0500  Weight: 96.7 kg 96.7 kg 93.7 kg    Intake/Output: I/O last 3 completed shifts: In: 1708.9 [I.V.:608.8; NG/GT:1000; IV Piggyback:100] Out: 1689 [Other:1689]   Intake/Output this shift:  Total I/O In: 2223.3 [I.V.:223.3; IV Piggyback:2000] Out: 285.8 [Other:285.8]  Physical Exam: General: Critically ill  Head: ETT   Eyes:   PERRL  Neck:   trachea midline  Lungs:  PRVC FiO2 28%  Heart: Regular rate and rhythm  Abdomen:  Soft,   obese  Extremities:  + peripheral edema.  Neurologic: Intubated and sedated  Skin: No lesions  Access: Left AVF Left femoral temp dialysis catheter 03/00    Basic Metabolic Panel: Recent Labs  Lab 08/15/19 0620  08/17/19 0504  08/19/19 0552  08/20/19 0534  08/20/19 1734 08/20/19 2125 08/21/19 0126 08/21/19 0542 08/21/19 1017  NA 137   < > 136   < > 139   < > 136   < > 135 137 137 137 135  K 3.8   < > 3.7   < > 4.5   < > 4.8   < > 4.2 4.1 3.8 4.0 4.2  CL 96*   < > 93*   < > 96*   < > 97*   < > 98 100 99 100 101  CO2 22   < > 23   < > 23   < > 22   < > '23 23 24 24 '$ 21*  GLUCOSE 98   < > 99   < > 157*   < > 176*   < > 153* 178* 140* 159* 159*  BUN 32*   < > 56*   < > 78*   < > 102*   < > 73* 68* 60* 56* 52*  CREATININE 6.32*   < > 6.26*   < > 6.31*   < > 7.10*   < > 4.40* 3.75* 3.27* 2.92* 2.83*  CALCIUM 7.9*   < > 8.6*   < > 8.7*   < > 8.6*   < > 8.8* 9.1 9.0 9.3 8.2*  MG 1.9  --   2.0  --  2.1  --  2.1  --   --   --   --  2.1  --   PHOS 6.5*   < > 7.2*   < > 5.7*   < > 5.5*   < > 4.0 3.5 3.1 2.9 2.8   < > = values in this interval not displayed.    Liver Function Tests: Recent Labs  Lab 08/17/19 0504  08/20/19 1734 08/20/19 2125 08/21/19 0126 08/21/19 0542 08/21/19 1017  AST 108*  --   --   --   --   --   --   ALT 60*  --   --   --   --   --   --   Arabella Merles  73  --   --   --   --   --   --   BILITOT 1.0  --   --   --   --   --   --   PROT 7.4  --   --   --   --   --   --   ALBUMIN 3.2*   < > 2.5* 2.7* 2.7* 2.8* 2.3*   < > = values in this interval not displayed.   No results for input(s): LIPASE, AMYLASE in the last 168 hours. No results for input(s): AMMONIA in the last 168 hours.  CBC: Recent Labs  Lab 08/15/19 0620 08/16/19 0518 08/18/19 0454 08/19/19 0552 08/20/19 0534 08/21/19 0542 08/21/19 1017  WBC 8.8 10.6* 15.6* 16.2* 23.5* 20.3*  --   NEUTROABS 6.9  --  12.4*  --  20.2* 17.4*  --   HGB 7.6* 7.8* 8.6* 9.3* 8.6* 8.2* 7.2*  HCT 24.3* 24.9* 27.7* 29.2* 26.6* 25.4* 22.2*  MCV 102.1* 100.4* 103.0* 100.0 98.2 99.2  --   PLT 209 196 180 168 137* 106*  --     Cardiac Enzymes: No results for input(s): CKTOTAL, CKMB, CKMBINDEX, TROPONINI in the last 168 hours.  BNP: Invalid input(s): POCBNP  CBG: Recent Labs  Lab 08/20/19 1931 08/20/19 2359 08/21/19 0406 08/21/19 0724 08/21/19 1144  GLUCAP 144* 143* 143* 155* 147*    Microbiology: Results for orders placed or performed during the hospital encounter of 07/27/2019  SARS Coronavirus 2 by RT PCR (hospital order, performed in [facility] hospital lab) Nasopharyngeal Nasopharyngeal Swab     Status: None   Collection Time: 08/06/2019 12:52 PM   Specimen: Nasopharyngeal Swab  Result Value Ref Range Status   SARS Coronavirus 2 NEGATIVE NEGATIVE Final    Comment: (NOTE) SARS-CoV-2 target nucleic acids are NOT DETECTED. The SARS-CoV-2 RNA is generally detectable in upper and  lower respiratory specimens during the acute phase of infection. The lowest concentration of SARS-CoV-2 viral copies this assay can detect is 250 copies / mL. A negative result does not preclude SARS-CoV-2 infection and should not be used as the sole basis for treatment or other patient management decisions.  A negative result may occur with improper specimen collection / handling, submission of specimen other than nasopharyngeal swab, presence of viral mutation(s) within the areas targeted by this assay, and inadequate number of viral copies (<250 copies / mL). A negative result must be combined with clinical observations, patient history, and epidemiological information. Fact Sheet for Patients:   StrictlyIdeas.no Fact Sheet for Healthcare Providers: BankingDealers.co.za This test is not yet approved or cleared  by the [place] FDA and has been authorized for detection and/or diagnosis of SARS-CoV-2 by FDA under an Emergency Use Authorization (EUA).  This EUA will remain in effect (meaning this test can be used) for the duration of the COVID-19 declaration under Section 564(b)(1) of the Act, 21 U.S.C. section 360bbb-3(b)(1), unless the authorization is terminated or revoked sooner. Performed at [facility], [address], [place], [place] [address]   Urine culture     Status: Abnormal   Collection Time: 07/29/2019  1:38 PM   Specimen: Urine, Random  Result Value Ref Range Status   Specimen Description   Final    URINE, RANDOM Performed at [facility], [address], [place], [place] [address]    Special Requests   Final    NONE Performed at [facility], [place],  [place] [address]    Culture 20,000 COLONIES/mL STAPHYLOCOCCUS AUREUS (A)  Final   Report Status 08/15/2019 FINAL  Final   Organism ID, Bacteria STAPHYLOCOCCUS AUREUS (A)  Final      Susceptibility   Staphylococcus  aureus - MIC*    CIPROFLOXACIN <=0.5 SENSITIVE Sensitive     GENTAMICIN <=0.5 SENSITIVE Sensitive     NITROFURANTOIN <=16 SENSITIVE Sensitive     OXACILLIN 1 SENSITIVE Sensitive     TETRACYCLINE <=1 SENSITIVE Sensitive     VANCOMYCIN <=0.5 SENSITIVE Sensitive     TRIMETH/SULFA <=10 SENSITIVE Sensitive     CLINDAMYCIN <=0.25 SENSITIVE Sensitive     RIFAMPIN <=0.5 SENSITIVE Sensitive     Inducible Clindamycin NEGATIVE Sensitive     * 20,000 COLONIES/mL STAPHYLOCOCCUS AUREUS  MRSA PCR Screening     Status: None   Collection Time: 08/16/2019 11:05 PM   Specimen: Nasal Mucosa; Nasopharyngeal  Result Value Ref Range Status   MRSA by PCR NEGATIVE NEGATIVE Final    Comment:        The GeneXpert MRSA Assay (FDA approved for NASAL specimens only), is one component of a comprehensive MRSA colonization surveillance program. It is not intended to diagnose MRSA infection nor to guide or monitor treatment for MRSA infections. Performed at [facility], [address] [place], [place] [address]   Culture, respiratory (non-expectorated)     Status: None   Collection Time: 08/16/19  3:36 PM   Specimen: Tracheal Aspirate; Respiratory  Result Value Ref Range Status   Specimen Description   Final    TRACHEAL ASPIRATE Performed at [facility], [address] [place], [place] [address]    Special Requests   Final    NONE Performed at [facility], [place], [place] [address]    Gram Stain   Final    RARE WBC PRESENT, PREDOMINANTLY PMN MODERATE GRAM POSITIVE COCCI RARE GRAM POSITIVE RODS Performed at [facility], [place] [address], [place], [place] [address]    Culture   Final    MODERATE METHICILLIN RESISTANT STAPHYLOCOCCUS AUREUS   Report Status 08/19/2019 FINAL  Final   Organism ID, Bacteria METHICILLIN RESISTANT STAPHYLOCOCCUS AUREUS  Final      Susceptibility   Methicillin resistant staphylococcus aureus - MIC*    CIPROFLOXACIN <=0.5  SENSITIVE Sensitive     ERYTHROMYCIN >=8 RESISTANT Resistant     GENTAMICIN <=0.5 SENSITIVE Sensitive     OXACILLIN >=4 RESISTANT Resistant     TETRACYCLINE <=1 SENSITIVE Sensitive     VANCOMYCIN 1 SENSITIVE Sensitive     TRIMETH/SULFA <=10 SENSITIVE Sensitive     CLINDAMYCIN RESISTANT Resistant     RIFAMPIN <=0.5 SENSITIVE Sensitive     Inducible Clindamycin POSITIVE Resistant     * MODERATE METHICILLIN RESISTANT STAPHYLOCOCCUS AUREUS  CULTURE, BLOOD (ROUTINE X 2) w Reflex to ID Panel     Status: None (Preliminary result)   Collection Time: 08/16/19  4:01 PM   Specimen: BLOOD  Result Value Ref Range Status   Specimen Description BLOOD BLOOD RIGHT HAND  Final   Special Requests   Final    BOTTLES DRAWN AEROBIC AND ANAEROBIC Blood Culture adequate volume   Culture   Final    NO GROWTH 4 DAYS Performed at [facility], [address] [place], [place] [address]    Report Status PENDING  Incomplete  CULTURE, BLOOD (ROUTINE X 2) w Reflex to ID Panel     Status: None (Preliminary result)  Collection Time: 08/16/19  4:09 PM   Specimen: BLOOD  Result Value Ref Range Status   Specimen Description BLOOD BLOOD RIGHT WRIST  Final   Special Requests   Final    BOTTLES DRAWN AEROBIC ONLY Blood Culture results may not be optimal due to an inadequate volume of blood received in culture bottles   Culture   Final    NO GROWTH 4 DAYS Performed at [facility], [address] [place], [place] [address]    Report Status PENDING  Incomplete    Coagulation Studies: Recent Labs    08/19/19 1021  LABPROT 16.3*  INR 1.3*    Urinalysis: No results for input(s): COLORURINE, LABSPEC, PHURINE, GLUCOSEU, HGBUR, BILIRUBINUR, KETONESUR, PROTEINUR, UROBILINOGEN, NITRITE, LEUKOCYTESUR in the last 72 hours.  Invalid input(s): APPERANCEUR    Imaging: Dg Chest Port 1 View  Result Date: 08/21/2019 CLINICAL DATA:  Abnormal x-ray EXAM: PORTABLE CHEST 1 VIEW COMPARISON:  08/19/2019  FINDINGS: Endotracheal tube remains in good position. NG tube enters the stomach with the tip not visualized Cardiac enlargement. Progression of vascular congestion and bilateral airspace disease most likely edema. No effusion. IMPRESSION: Progression of congestive heart failure and edema. Endotracheal tube remains in good position. Electronically Signed   By: Franchot Gallo M.D.   On: 08/21/2019 10:48   Dg Chest Port 1 View  Result Date: 08/19/2019 CLINICAL DATA:  68 year old female with central line placement. EXAM: PORTABLE CHEST 1 VIEW COMPARISON:  Earlier radiograph dated 08/19/2019. FINDINGS: Endotracheal tube above the carina and enteric tube extending below the diaphragm with tip beyond the inferior margin of the image. Bilateral hazy and streaky densities as seen previously most consistent with infiltrate. No large pleural effusion or pneumothorax. Stable cardiac silhouette. Atherosclerotic calcification of the aorta. No acute osseous pathology. IMPRESSION: 1. Endotracheal tube above the carina and enteric tube with tip beyond the inferior margin of the image. 2. No significant interval change in the appearance of the lungs. Electronically Signed   By: Anner Crete M.D.   On: 08/19/2019 20:55     Medications:   . sodium chloride 250 mL (08/19/19 1927)  . sodium chloride    . amiodarone 30 mg/hr (08/21/19 1333)  . dexmedetomidine (PRECEDEX) IV infusion Stopped (08/21/19 4332)  . fluconazole (DIFLUCAN) IV Stopped (08/20/19 2304)  . phenylephrine (NEO-SYNEPHRINE) Adult infusion Stopped (08/21/19 1232)  . prismasol BGK 2/2.5 dialysis solution 2,000 mL/hr at 08/21/19 0358  . prismasol BGK 2/2.5 replacement solution 200 mL/hr at 08/20/19 1222  . prismasol BGK 2/2.5 replacement solution 200 mL/hr at 08/20/19 1222  . vancomycin 1,000 mg (08/20/19 2001)   . amiodarone  150 mg Intravenous Once  . chlorhexidine gluconate (MEDLINE KIT)  15 mL Mouth Rinse BID  . Chlorhexidine Gluconate Cloth   6 each Topical Q0600  . docusate  100 mg Per Tube BID  . epoetin (EPOGEN/PROCRIT) injection  10,000 Units Subcutaneous Q T,Th,Sa-HD  . feeding supplement (PRO-STAT SUGAR FREE 64)  30 mL Per Tube TID  . feeding supplement (VITAL HIGH PROTEIN)  1,000 mL Per Tube Q24H  . heparin injection (subcutaneous)  5,000 Units Subcutaneous Q12H  . hydrocortisone sod succinate (SOLU-CORTEF) inj  100 mg Intravenous Q12H  . mouth rinse  15 mL Mouth Rinse 10 times per day  . midodrine  5 mg Per Tube TID WC  . pantoprazole (PROTONIX) IV  40 mg Intravenous BID  . polyethylene glycol  17 g Per Tube Daily  . sennosides  10 mL Per Tube BID  .  sodium chloride flush  3 mL Intravenous Q12H   sodium chloride, acetaminophen, heparin, heparin, sodium chloride, sodium chloride flush  Assessment/ Plan:  Ms. BRITNEE MCDEVITT is a 68 y.o. black female with stage renal disease on hemodialysis, coronary artery disease status post CABG, peripheral arterial disease, COPD, hypertension, hyperlipidemia, diabetes type 2, CVA, history of anxiety and depression, obstructive sleep apnea admitted to [facility] on 07/28/2019 for Shortness of breath [R06.02] Acute pulmonary edema ([place]) [J81.0] SOB (shortness of breath) [R06.02] ESRD on dialysis ([place]) [N18.6, Z99.2] Encounter for imaging study to confirm orogastric (OG) tube placement [Z01.89] Urinary tract infection without hematuria, site unspecified [N39.0]  [place] Left AVF  1. End Stage Renal Disease: hemodynamically unstable for intermittent hemodialysis treatment.  Continue continous renal replacement therapy. Keep even.   2. Acute respiratory failure requiring mechanical ventilation. With MRSA pneumonia - Vancomycin.  - Appreciate pulmonary/critical care input.   3. Anemia of chronic kidney disease: macrocytic.  EPO subcu  4. Hypotension with sepsis : MRSA pneumonia Requiring phenylephrine  - vancomycin.    LOS: 8 Arles Rumbold 12/2/20201:45  PM

## 2019-08-21 NOTE — Consult Note (Addendum)
Pharmacy Antibiotic Note  Kimberly Shaffer is a 68 y.o. female admitted on 08/08/2019 with pneumonia.  Pharmacy has been consulted for Vancomycin dosing. Respiratory culture positive for Staphylococcus Aureus.  Currently on ventilator. Patient receives scheduled dialysis on Tuesday, Thursday, and Saturdays. Patient being transitioned to CRRT on 11/30.   Plan: Continue vancomycin 1g IV Q24hr while patient receiving CRRT. Will plan to check level prior to dose on 12/2 to help guide therapy. Goal trough 15-25.   Height: 5\' 6"  (167.6 cm) Weight: 206 lb 9.1 oz (93.7 kg) IBW/kg (Calculated) : 59.3  Temp (24hrs), Avg:98.6 F (37 C), Min:98.2 F (36.8 C), Max:99.2 F (37.3 C)  Recent Labs  Lab 08/16/19 0518  08/18/19 0454 08/19/19 0552  08/20/19 0534  08/20/19 2125 08/21/19 0126 08/21/19 0542 08/21/19 1017 08/21/19 1318  WBC 10.6*  --  15.6* 16.2*  --  23.5*  --   --   --  20.3*  --   --   CREATININE 4.09*   < > 4.09* 6.31*   < > 7.10*   < > 3.75* 3.27* 2.92* 2.83* 2.52*  LATICACIDVEN  --   --   --  2.2*  --   --   --   --   --   --   --   --    < > = values in this interval not displayed.    Estimated Creatinine Clearance: 24.7 mL/min (A) (by C-G formula based on SCr of 2.52 mg/dL (H)).    Allergies  Allergen Reactions  . Nystatin Hives and Itching  . Sulfa Antibiotics Swelling, Hives and Rash  . Niaspan [Niacin] Other (See Comments) and Hives    Antimicrobials this admission: Ceftriaxone 11/24 x 1 Vancomycin 11/28 >>   Microbiology results: 11/28 BCx: no growth x 5 days  11/24 UCx: 20k Staph Aureus  11/27 Tracheal Aspirate: MRSA   Thank you for allowing pharmacy to be a part of this patient's care.  Simpson,Michael L 08/21/2019 3:59 PM

## 2019-08-22 ENCOUNTER — Inpatient Hospital Stay: Payer: Medicare Other

## 2019-08-22 LAB — COMPREHENSIVE METABOLIC PANEL
ALT: 2640 U/L — ABNORMAL HIGH (ref 0–44)
AST: 5604 U/L — ABNORMAL HIGH (ref 15–41)
Albumin: 1.9 g/dL — ABNORMAL LOW (ref 3.5–5.0)
Alkaline Phosphatase: 82 U/L (ref 38–126)
Anion gap: 22 — ABNORMAL HIGH (ref 5–15)
BUN: 37 mg/dL — ABNORMAL HIGH (ref 8–23)
CO2: 16 mmol/L — ABNORMAL LOW (ref 22–32)
Calcium: 8.1 mg/dL — ABNORMAL LOW (ref 8.9–10.3)
Chloride: 98 mmol/L (ref 98–111)
Creatinine, Ser: 2.29 mg/dL — ABNORMAL HIGH (ref 0.44–1.00)
GFR calc Af Amer: 25 mL/min — ABNORMAL LOW (ref >60)
GFR calc non Af Amer: 21 mL/min — ABNORMAL LOW (ref >60)
Glucose, Bld: 222 mg/dL — ABNORMAL HIGH (ref 70–99)
Potassium: 5.7 mmol/L — ABNORMAL HIGH (ref 3.5–5.1)
Sodium: 136 mmol/L (ref 135–145)
Total Bilirubin: 1.1 mg/dL (ref 0.3–1.2)
Total Protein: 5.3 g/dL — ABNORMAL LOW (ref 6.5–8.1)

## 2019-08-22 LAB — RENAL FUNCTION PANEL
Albumin: 2.6 g/dL — ABNORMAL LOW (ref 3.5–5.0)
Albumin: 2.6 g/dL — ABNORMAL LOW (ref 3.5–5.0)
Albumin: 2.7 g/dL — ABNORMAL LOW (ref 3.5–5.0)
Anion gap: 15 (ref 5–15)
Anion gap: 15 (ref 5–15)
Anion gap: 19 — ABNORMAL HIGH (ref 5–15)
BUN: 46 mg/dL — ABNORMAL HIGH (ref 8–23)
BUN: 35 mg/dL — ABNORMAL HIGH (ref 8–23)
BUN: 33 mg/dL — ABNORMAL HIGH (ref 8–23)
CO2: 23 mmol/L (ref 22–32)
CO2: 22 mmol/L (ref 22–32)
CO2: 18 mmol/L — ABNORMAL LOW (ref 22–32)
Calcium: 8.8 mg/dL — ABNORMAL LOW (ref 8.9–10.3)
Calcium: 9.1 mg/dL (ref 8.9–10.3)
Calcium: 9.5 mg/dL (ref 8.9–10.3)
Chloride: 97 mmol/L — ABNORMAL LOW (ref 98–111)
Chloride: 100 mmol/L (ref 98–111)
Chloride: 100 mmol/L (ref 98–111)
Creatinine, Ser: 2.61 mg/dL — ABNORMAL HIGH (ref 0.44–1.00)
Creatinine, Ser: 1.94 mg/dL — ABNORMAL HIGH (ref 0.44–1.00)
Creatinine, Ser: 1.9 mg/dL — ABNORMAL HIGH (ref 0.44–1.00)
GFR calc Af Amer: 21 mL/min — ABNORMAL LOW (ref >60)
GFR calc Af Amer: 30 mL/min — ABNORMAL LOW (ref >60)
GFR calc Af Amer: 31 mL/min — ABNORMAL LOW (ref >60)
GFR calc non Af Amer: 18 mL/min — ABNORMAL LOW (ref >60)
GFR calc non Af Amer: 26 mL/min — ABNORMAL LOW (ref >60)
GFR calc non Af Amer: 27 mL/min — ABNORMAL LOW (ref >60)
Glucose, Bld: 114 mg/dL — ABNORMAL HIGH (ref 70–99)
Glucose, Bld: 140 mg/dL — ABNORMAL HIGH (ref 70–99)
Glucose, Bld: 117 mg/dL — ABNORMAL HIGH (ref 70–99)
Phosphorus: 4.3 mg/dL (ref 2.5–4.6)
Phosphorus: 3.5 mg/dL (ref 2.5–4.6)
Phosphorus: 4.4 mg/dL (ref 2.5–4.6)
Potassium: 4.1 mmol/L (ref 3.5–5.1)
Potassium: 4.1 mmol/L (ref 3.5–5.1)
Potassium: 4.6 mmol/L (ref 3.5–5.1)
Sodium: 135 mmol/L (ref 135–145)
Sodium: 137 mmol/L (ref 135–145)
Sodium: 137 mmol/L (ref 135–145)

## 2019-08-22 LAB — CBC
HCT: 24.5 % — ABNORMAL LOW (ref 36.0–46.0)
Hemoglobin: 7.4 g/dL — ABNORMAL LOW (ref 12.0–15.0)
MCH: 32 pg (ref 26.0–34.0)
MCHC: 30.2 g/dL (ref 30.0–36.0)
MCV: 106.1 fL — ABNORMAL HIGH (ref 80.0–100.0)
Platelets: 105 10*3/uL — ABNORMAL LOW (ref 150–400)
RBC: 2.31 MIL/uL — ABNORMAL LOW (ref 3.87–5.11)
RDW: 15.2 % (ref 11.5–15.5)
WBC: 18 10*3/uL — ABNORMAL HIGH (ref 4.0–10.5)
nRBC: 1.1 % — ABNORMAL HIGH (ref 0.0–0.2)

## 2019-08-22 LAB — GLUCOSE, CAPILLARY
Glucose-Capillary: 112 mg/dL — ABNORMAL HIGH (ref 70–99)
Glucose-Capillary: 54 mg/dL — ABNORMAL LOW (ref 70–99)
Glucose-Capillary: 54 mg/dL — ABNORMAL LOW (ref 70–99)
Glucose-Capillary: 90 mg/dL (ref 70–99)
Glucose-Capillary: 93 mg/dL (ref 70–99)
Glucose-Capillary: 98 mg/dL (ref 70–99)

## 2019-08-22 LAB — BLOOD GAS, ARTERIAL
Acid-base deficit: 16.3 mmol/L — ABNORMAL HIGH (ref 0.0–2.0)
Bicarbonate: 15.5 mmol/L — ABNORMAL LOW (ref 20.0–28.0)
FIO2: 1
MECHVT: 450 mL
O2 Saturation: 19.6 %
PEEP: 5 cmH2O
Patient temperature: 37
RATE: 16 resp/min
pCO2 arterial: 63 mmHg — ABNORMAL HIGH (ref 32.0–48.0)
pH, Arterial: 7 — CL (ref 7.350–7.450)
pO2, Arterial: <31 mmHg — CL (ref 83.0–108.0)

## 2019-08-22 LAB — MAGNESIUM
Magnesium: 2.1 mg/dL (ref 1.7–2.4)
Magnesium: 2.4 mg/dL (ref 1.7–2.4)

## 2019-08-22 MED ORDER — ROCURONIUM BROMIDE 50 MG/5ML IV SOLN
50.0000 mg | Freq: Once | INTRAVENOUS | Status: AC
  Administered 2019-08-22: 50 mg via INTRAVENOUS

## 2019-08-22 MED ORDER — MIDAZOLAM HCL 2 MG/2ML IJ SOLN: 1.0000 mg | INTRAMUSCULAR | Status: DC | PRN

## 2019-08-22 MED ORDER — NOREPINEPHRINE 16 MG/250ML-% IV SOLN
0.0000 ug/min | INTRAVENOUS | Status: DC
  Administered 2019-08-22: 70 ug/min via INTRAVENOUS
  Filled 2019-08-22 (×2): qty 250

## 2019-08-22 MED ORDER — VASOPRESSIN 20 UNIT/ML IV SOLN
0.0300 [IU]/min | INTRAVENOUS | Status: DC
  Administered 2019-08-22: 0.03 [IU]/min via INTRAVENOUS
  Filled 2019-08-22: qty 2

## 2019-08-22 MED ORDER — MIDAZOLAM HCL 2 MG/2ML IJ SOLN
INTRAMUSCULAR | Status: AC
  Filled 2019-08-22: qty 4

## 2019-08-22 MED ORDER — SODIUM CHLORIDE 0.9 % IV SOLN
0.0000 ug/min | INTRAVENOUS | Status: DC
  Filled 2019-08-22: qty 1

## 2019-08-22 MED ORDER — DOBUTAMINE IN D5W 4-5 MG/ML-% IV SOLN
2.5000 ug/kg/min | INTRAVENOUS | Status: DC
  Administered 2019-08-22: 5 ug/kg/min via INTRAVENOUS
  Filled 2019-08-22: qty 250

## 2019-08-22 MED ORDER — LORAZEPAM 0.5 MG PO TABS: 0.5000 mg | ORAL_TABLET | Freq: Four times a day (QID) | ORAL | Status: DC

## 2019-08-22 MED ORDER — PHENYLEPHRINE HCL-NACL 10-0.9 MG/250ML-% IV SOLN
0.0000 ug/min | INTRAVENOUS | Status: DC
  Filled 2019-08-22: qty 250

## 2019-08-22 MED ORDER — FENTANYL 2500MCG IN NS 250ML (10MCG/ML) PREMIX INFUSION
25.0000 ug/h | INTRAVENOUS | Status: DC
  Administered 2019-08-22: 100 ug/h via INTRAVENOUS
  Filled 2019-08-22: qty 250

## 2019-08-22 MED ORDER — ROCURONIUM BROMIDE 50 MG/5ML IV SOLN
INTRAVENOUS | Status: AC
  Filled 2019-08-22: qty 1

## 2019-08-22 MED ORDER — FENTANYL BOLUS VIA INFUSION
25.0000 ug | INTRAVENOUS | Status: DC | PRN
  Filled 2019-08-22: qty 25

## 2019-08-22 MED ORDER — METOPROLOL TARTRATE 5 MG/5ML IV SOLN: 2.5000 mg | Freq: Four times a day (QID) | INTRAVENOUS | Status: DC | PRN

## 2019-08-22 MED ORDER — FENTANYL CITRATE (PF) 100 MCG/2ML IJ SOLN
INTRAMUSCULAR | Status: AC
  Filled 2019-08-22: qty 2

## 2019-08-22 MED ORDER — FENTANYL CITRATE (PF) 100 MCG/2ML IJ SOLN
100.0000 ug | Freq: Once | INTRAMUSCULAR | Status: AC
  Administered 2019-08-22: 100 ug via INTRAVENOUS

## 2019-08-26 LAB — CULTURE, BLOOD (ROUTINE X 2)
Culture: NO GROWTH
Culture: NO GROWTH
Special Requests: ADEQUATE

## 2019-09-20 NOTE — Progress Notes (Signed)
Pt extubated without complications, family at bedside.

## 2019-09-20 NOTE — Procedures (Signed)
Endotracheal Intubation: Patient required placement of an artificial airway secondary to Respiratory Failure  Consent: Emergent.   Hand washing performed prior to starting the procedure.   Medications administered for sedation prior to procedure:   Rocuronium 10 mg IV, Fentanyl 100 mcg IV.    A time out procedure was called and correct patient, name, & ID confirmed. Needed supplies and equipment were assembled and checked to include ETT, 10 ml syringe, Glidescope, Mac and Miller blades, suction, oxygen and bag mask valve, end tidal CO2 monitor.   Patient was positioned to align the mouth and pharynx to facilitate visualization of the glottis.   Heart rate, SpO2 and blood pressure was continuously monitored during the procedure. Pre-oxygenation was conducted prior to intubation and endotracheal tube was placed through the vocal cords into the trachea.     The artificial airway was placed under direct visualization via glidescope route using a 7.5 ETT on the first attempt.  ETT was secured at 23 cm mark.  Placement was confirmed by auscuitation of lungs with good breath sounds bilaterally and no stomach sounds.  Condensation was noted on endotracheal tube.   Pulse ox 98%.  CO2 detector in place with appropriate color change.   Complications: None .    Chest radiograph ordered and pending.   Comments: OGT placed via glidescope.   Ottie Glazier, M.D.  [facility] [place]

## 2019-09-20 NOTE — Progress Notes (Signed)
[facility]  ROUNDING NOTE   Subjective:   Late entry:  Examined this morning. On CRRT.    Objective:  Vital signs in last 24 hours:  Temp:  [98 F (36.7 C)-99.9 F (37.7 C)] 98 F (36.7 C) (12/03 0730) Pulse Rate:  [25-129] 104 (12/03 1030) Resp:  [16-32] 26 (12/03 1300) BP: (42-147)/(18-90) 100/49 (12/03 1205) SpO2:  [63 %-100 %] 63 % (12/03 1030) FiO2 (%):  [100 %] 100 % (12/03 1130) Weight:  [97.3 kg] 97.3 kg (12/03 0500)  Weight change: 3.6 kg Filed Weights   08/20/19 0500 08/21/19 0500 Sep 20, 2019 0500  Weight: 96.7 kg 93.7 kg 97.3 kg    Intake/Output: I/O last 3 completed shifts: In: 2977.8 [I.V.:877.8; IV [id number] Out: 1627.3 [Other:1627.3]   Intake/Output this shift:  Total I/O In: -  Out: 94 [Other:94]  Physical Exam: General: Critically ill  Head: ETT   Eyes:   PERRL  Neck:   trachea midline  Lungs:  PRVC FiO2 28%  Heart: Regular rate and rhythm  Abdomen:  Soft,   obese  Extremities:  + peripheral edema.  Neurologic: Intubated and sedated  Skin: No lesions  Access: Left AVF Left femoral temp dialysis catheter [id number]    Basic Metabolic Panel: Recent Labs  Lab 08/19/19 0552  08/20/19 0534  08/21/19 0542  08/21/19 1842 08/21/19 2140 2019-09-20 0158 September 20, 2019 0509 09-20-19 0929 09-20-2019 1133  NA 139   < > 136   < > 137   < > 135 136 135 137 137  --   K 4.5   < > 4.8   < > 4.0   < > 4.0 4.0 4.1 4.1 4.6  --   CL 96*   < > 97*   < > 100   < > 98 97* 97* 100 100  --   CO2 23   < > 22   < > 24   < > 24 23 23 22  18*  --   GLUCOSE 157*   < > 176*   < > 159*   < > 132* 157* 114* 140* 117*  --   BUN 78*   < > 102*   < > 56*   < > 45* 41* 46* 35* 33*  --   CREATININE 6.31*   < > 7.10*   < > 2.92*   < > 2.20* 2.16* 2.61* 1.94* 1.90*  --   CALCIUM 8.7*   < > 8.6*   < > 9.3   < > 9.2 9.1 8.8* 9.1 9.5  --   MG 2.1  --  2.1  --  2.1  --   --   --   --  2.1  --  2.4  PHOS 5.7*   < > 5.5*   < > 2.9   < > 2.6 3.1 4.3 3.5 4.4  --    < > =  values in this interval not displayed.    Liver Function Tests: Recent Labs  Lab 08/17/19 0504  08/21/19 1842 08/21/19 2140 20-Sep-2019 0158 2019/09/20 0509 2019-09-20 0929  AST 108*  --   --   --   --   --   --   ALT 60*  --   --   --   --   --   --   ALKPHOS 73  --   --   --   --   --   --   BILITOT 1.0  --   --   --   --   --   --  PROT 7.4  --   --   --   --   --   --   ALBUMIN 3.2*   < > 2.6* 2.6* 2.6* 2.6* 2.7*   < > = values in this interval not displayed.   No results for input(s): LIPASE, AMYLASE in the last 168 hours. No results for input(s): AMMONIA in the last 168 hours.  CBC: Recent Labs  Lab 08/18/19 0454 08/19/19 0552 08/20/19 0534 08/21/19 0542 08/21/19 1017 2019/09/21 0509  WBC 15.6* 16.2* 23.5* 20.3*  --  18.0*  NEUTROABS 12.4*  --  20.2* 17.4*  --   --   HGB 8.6* 9.3* 8.6* 8.2* 7.2* 7.4*  HCT 27.7* 29.2* 26.6* 25.4* 22.2* 24.5*  MCV 103.0* 100.0 98.2 99.2  --  106.1*  PLT 180 168 137* 106*  --  105*    Cardiac Enzymes: No results for input(s): CKTOTAL, CKMB, CKMBINDEX, TROPONINI in the last 168 hours.  BNP: Invalid input(s): POCBNP  CBG: Recent Labs  Lab 21-Sep-2019 0329 09/21/2019 0717 September 21, 2019 1036 2019-09-21 1103 09/21/19 1105  GLUCAP 98 93 54* 54* 90    Microbiology: Results for orders placed or performed during the hospital encounter of 08/03/2019  SARS Coronavirus 2 by RT PCR (hospital order, performed in [facility] hospital lab) Nasopharyngeal Nasopharyngeal Swab     Status: None   Collection Time: 08/01/2019 12:52 PM   Specimen: Nasopharyngeal Swab  Result Value Ref Range Status   SARS Coronavirus 2 NEGATIVE NEGATIVE Final    Comment: (NOTE) SARS-CoV-2 target nucleic acids are NOT DETECTED. The SARS-CoV-2 RNA is generally detectable in upper and lower respiratory specimens during the acute phase of infection. The lowest concentration of SARS-CoV-2 viral copies this assay can detect is 250 copies / mL. A negative result does not preclude  SARS-CoV-2 infection and should not be used as the sole basis for treatment or other patient management decisions.  A negative result may occur with improper specimen collection / handling, submission of specimen other than nasopharyngeal swab, presence of viral mutation(s) within the areas targeted by this assay, and inadequate number of viral copies (<250 copies / mL). A negative result must be combined with clinical observations, patient history, and epidemiological information. Fact Sheet for Patients:   StrictlyIdeas.no Fact Sheet for Healthcare Providers: BankingDealers.co.za This test is not yet approved or cleared  by the [place] FDA and has been authorized for detection and/or diagnosis of SARS-CoV-2 by FDA under an Emergency Use Authorization (EUA).  This EUA will remain in effect (meaning this test can be used) for the duration of the COVID-19 declaration under Section 564(b)(1) of the Act, 21 U.S.C. section 360bbb-3(b)(1), unless the authorization is terminated or revoked sooner. Performed at [facility], [address] [place], [place] [address]   Urine culture     Status: Abnormal   Collection Time: 07/28/2019  1:38 PM   Specimen: Urine, Random  Result Value Ref Range Status   Specimen Description   Final    URINE, RANDOM Performed at [facility], [address] [place], [place] [address]    Special Requests   Final    NONE Performed at [facility], [place], [place] [address]    Culture 20,000 COLONIES/mL STAPHYLOCOCCUS AUREUS (A)  Final   Report Status 08/15/2019 FINAL  Final   Organism ID, Bacteria STAPHYLOCOCCUS AUREUS (A)  Final      Susceptibility   Staphylococcus aureus - MIC*    CIPROFLOXACIN <=0.5 SENSITIVE  Sensitive     GENTAMICIN <=0.5 SENSITIVE Sensitive     NITROFURANTOIN <=16 SENSITIVE Sensitive     OXACILLIN 1 SENSITIVE Sensitive     TETRACYCLINE  <=1 SENSITIVE Sensitive     VANCOMYCIN <=0.5 SENSITIVE Sensitive     TRIMETH/SULFA <=10 SENSITIVE Sensitive     CLINDAMYCIN <=0.25 SENSITIVE Sensitive     RIFAMPIN <=0.5 SENSITIVE Sensitive     Inducible Clindamycin NEGATIVE Sensitive     * 20,000 COLONIES/mL STAPHYLOCOCCUS AUREUS  MRSA PCR Screening     Status: None   Collection Time: 07/26/2019 11:05 PM   Specimen: Nasal Mucosa; Nasopharyngeal  Result Value Ref Range Status   MRSA by PCR NEGATIVE NEGATIVE Final    Comment:        The GeneXpert MRSA Assay (FDA approved for NASAL specimens only), is one component of a comprehensive MRSA colonization surveillance program. It is not intended to diagnose MRSA infection nor to guide or monitor treatment for MRSA infections. Performed at [facility], [address] [place], [place] [address]   Culture, respiratory (non-expectorated)     Status: None   Collection Time: 08/16/19  3:36 PM   Specimen: Tracheal Aspirate; Respiratory  Result Value Ref Range Status   Specimen Description   Final    TRACHEAL ASPIRATE Performed at [facility], [address] [place], [place] [address]    Special Requests   Final    NONE Performed at [facility], [place], [place] [address]    Gram Stain   Final    RARE WBC PRESENT, PREDOMINANTLY PMN MODERATE GRAM POSITIVE COCCI RARE GRAM POSITIVE RODS Performed at [facility], [place] [address], [place], [place] [address]    Culture   Final    MODERATE METHICILLIN RESISTANT STAPHYLOCOCCUS AUREUS   Report Status 08/19/2019 FINAL  Final   Organism ID, Bacteria METHICILLIN RESISTANT STAPHYLOCOCCUS AUREUS  Final      Susceptibility   Methicillin resistant staphylococcus aureus - MIC*    CIPROFLOXACIN <=0.5 SENSITIVE Sensitive     ERYTHROMYCIN >=8 RESISTANT Resistant     GENTAMICIN <=0.5 SENSITIVE Sensitive     OXACILLIN >=4 RESISTANT Resistant     TETRACYCLINE <=1 SENSITIVE Sensitive      VANCOMYCIN 1 SENSITIVE Sensitive     TRIMETH/SULFA <=10 SENSITIVE Sensitive     CLINDAMYCIN RESISTANT Resistant     RIFAMPIN <=0.5 SENSITIVE Sensitive     Inducible Clindamycin POSITIVE Resistant     * MODERATE METHICILLIN RESISTANT STAPHYLOCOCCUS AUREUS  CULTURE, BLOOD (ROUTINE X 2) w Reflex to ID Panel     Status: None (Preliminary result)   Collection Time: 08/16/19  4:01 PM   Specimen: BLOOD  Result Value Ref Range Status   Specimen Description BLOOD BLOOD RIGHT HAND  Final   Special Requests   Final    BOTTLES DRAWN AEROBIC AND ANAEROBIC Blood Culture adequate volume   Culture   Final    NO GROWTH 4 DAYS Performed at [facility], [address], [place],  [address]    Report Status PENDING  Incomplete  CULTURE, BLOOD (ROUTINE X 2) w Reflex to ID Panel     Status: None (Preliminary result)   Collection Time: 08/16/19  4:09 PM   Specimen: BLOOD  Result Value Ref Range Status   Specimen Description BLOOD BLOOD RIGHT WRIST  Final   Special Requests   Final    BOTTLES DRAWN AEROBIC ONLY Blood Culture results may not be optimal  due to an inadequate volume of blood received in culture bottles   Culture   Final    NO GROWTH 4 DAYS Performed at [facility], [address] [place], [place] [address]    Report Status PENDING  Incomplete    Coagulation Studies: No results for input(s): LABPROT, INR in the last 72 hours.  Urinalysis: No results for input(s): COLORURINE, LABSPEC, PHURINE, GLUCOSEU, HGBUR, BILIRUBINUR, KETONESUR, PROTEINUR, UROBILINOGEN, NITRITE, LEUKOCYTESUR in the last 72 hours.  Invalid input(s): APPERANCEUR    Imaging: Dg Chest Port 1 View  Result Date: 08/30/19 CLINICAL DATA:  Acute on chronic respiratory failure. EXAM: PORTABLE CHEST 1 VIEW COMPARISON:  Single-view of the chest 08/21/2019 and 08/19/2019. FINDINGS: NG tube and ET tube have been removed. Cardiomegaly and interstitial edema appear unchanged. No pneumothorax or  pleural fluid. No acute or focal bony abnormality. IMPRESSION: No change in cardiomegaly and interstitial edema. Electronically Signed   By: Inge Rise M.D.   On: 2019-08-30 07:13   Dg Chest Port 1 View  Result Date: 08/21/2019 CLINICAL DATA:  Abnormal x-ray EXAM: PORTABLE CHEST 1 VIEW COMPARISON:  08/19/2019 FINDINGS: Endotracheal tube remains in good position. NG tube enters the stomach with the tip not visualized Cardiac enlargement. Progression of vascular congestion and bilateral airspace disease most likely edema. No effusion. IMPRESSION: Progression of congestive heart failure and edema. Endotracheal tube remains in good position. Electronically Signed   By: Franchot Gallo M.D.   On: 08/21/2019 10:48     Medications:   . sodium chloride 250 mL (08/19/19 1927)  . sodium chloride    . amiodarone 60 mg/hr (08/30/2019 0619)  . DOBUTamine 5 mcg/kg/min (Aug 30, 2019 1205)  . fentaNYL infusion INTRAVENOUS 100 mcg/hr (Aug 30, 2019 1120)  . norepinephrine (LEVOPHED) Adult infusion 70 mcg/min (30-Aug-2019 1116)  . prismasol BGK 2/2.5 dialysis solution 2,000 mL/hr at 08/21/19 0358  . prismasol BGK 2/2.5 replacement solution 200 mL/hr at 08/20/19 1222  . prismasol BGK 2/2.5 replacement solution 200 mL/hr at 08/20/19 1222  . vancomycin Stopped (08/21/19 2100)  . vasopressin (PITRESSIN) infusion - *FOR SHOCK* 0.03 Units/min (2019-08-30 1129)   . amiodarone  150 mg Intravenous Once  . Chlorhexidine Gluconate Cloth  6 each Topical Q0600  . clotrimazole  10 mg Oral TID  . docusate  100 mg Per Tube BID  . epoetin (EPOGEN/PROCRIT) injection  10,000 Units Subcutaneous Q T,Th,Sa-HD  . fentaNYL      . heparin injection (subcutaneous)  5,000 Units Subcutaneous Q12H  . hydrocortisone sod succinate (SOLU-CORTEF) inj  100 mg Intravenous Q12H  . LORazepam  0.5 mg Per Tube Q6H  . mouth rinse  15 mL Mouth Rinse BID  . midodrine  5 mg Per Tube TID WC  . pantoprazole (PROTONIX) IV  40 mg Intravenous BID  .  polyethylene glycol  17 g Per Tube Daily  . rocuronium      . sennosides  10 mL Per Tube BID  . sodium chloride flush  3 mL Intravenous Q12H   sodium chloride, acetaminophen, fentaNYL, heparin, heparin, metoprolol tartrate, midazolam, midazolam, morphine injection, sodium chloride, sodium chloride flush  Assessment/ Plan:  [name] is a 69 y.o. black female with stage renal disease on hemodialysis, coronary artery disease status post CABG, peripheral arterial disease, COPD, hypertension, hyperlipidemia, diabetes type 2, CVA, history of anxiety and depression, obstructive sleep apnea admitted to [facility] on 08/04/2019 for Shortness of breath [R06.02] Acute pulmonary edema ([place]) [J81.0] SOB (shortness of breath) [R06.02] ESRD on dialysis [facility]) [  N18.6, Z99.2] Encounter for imaging study to confirm orogastric (OG) tube placement [Z01.89] Urinary tract infection without hematuria, site unspecified [N39.0]  [place]. 98kg Left AVF  1. End Stage Renal Disease: hemodynamically unstable for intermittent hemodialysis treatment.   2. Acute respiratory failure requiring mechanical ventilation. With MRSA pneumonia - Vancomycin.  - Appreciate pulmonary/critical care input.   3. Anemia of chronic kidney disease: macrocytic.  EPO subcu  4. Hypotension with sepsis : MRSA pneumonia Requiring phenylephrine  - vancomycin.    LOS: 9 Renna Kilmer Dec 22, 20201:33 PM

## 2019-09-20 NOTE — Progress Notes (Signed)
SLP Cancellation Note  Patient Details Name: KAYA CAJAS MRN: [id number] DOB: 1951-01-26   Cancelled treatment:       Reason Eval/Treat Not Completed: (chart reviewed; NSG consulted) . Pt passed away today.     Orinda Kenner, MS, CCC-SLP Kylani Wires 09/20/19, 2:50 PM

## 2019-09-20 NOTE — Progress Notes (Signed)
Ch received a referral to support family of the pt that was actively dying. Pt family was gather at bedside upon ch arrival. Ch provided spiritual and emotional support as the family made a decision on changing the pt's code status to DNR. Ch was present when pt coded at around 1030 AM. Pt declined suddenly as the dau reported that she spoke to the pt this morning around 0830 AM. Ch helped to facilitate the celebration and words of gratitude spoken by the pt's children. Ch provided a prayer shawl, read scripture form Ps 31, and prayer for the comfort of the family. Ch escorted pt family members out of ICU to the [place]. Pt's dau shared that she had another family member that would like to say their good byes to the pt and also to get assistance for the doctor regarding documents. Ch encouraged pt dau to f/u with the intensivist on call. Ch support was appreciated.    09-17-2019 1300  Clinical Encounter Type  Visited With Patient and family together;Health care provider  Visit Type Spiritual support;Social support;Patient actively dying  Referral From Nurse  Consult/Referral To [place];[facility] text;Emotional;Grief support  Stress Factors  Patient Stress Factors Exhausted;Health changes;Major life changes  Family Stress Factors Family relationships;Major life changes;Loss

## 2019-09-20 NOTE — Progress Notes (Signed)
Patient expired with family at bedside. Comfort measures had been initiated after code blue called this AM. Patient expired comfortably, extubated per comfort measure orders.

## 2019-09-20 NOTE — Progress Notes (Signed)
CDS notified of patient's passing. Referral [id number]. May release to funeral home.

## 2019-09-20 NOTE — Progress Notes (Addendum)
Daily Progress Note   Patient Name: Kimberly Shaffer       Date: 09/07/2019 DOB: 05/21/1951  Age: 69 y.o. MRN#: [id number] Attending Physician: Ottie Glazier, MD Primary Care Physician: Clinic, [facility] Admit Date: 07/26/2019  Reason for Consultation/Follow-up: Establishing goals of care  Subjective: Patient is resting in bed with labored respirations, audible moist breathing and cough, and gazing look. On CRRT. Explained likelihood of imminent reintubation, and explained comfort care to patient.  She states "yes" she would want the breathing tube replaced, and "yes" to having CPR. When asked how long she would want a breathing tube in place she said "forever".  RN to bedside with daughter on speaker phone. Discussed with her daughter the patient's labored breathing and concern for possible imminent reintubation; also discussed option of comfort care. She states she wants everything done possible for her mother and will call her siblings. Did relay to her what patient had said. RT at bedside and patient placed on BIPAP. Decision then made for re-intubation. Called the daughter back, she is tearful, and states to reintubate the patient. Patient intubated by CCM. Daughter called to speak with [place], updated her that patient had coded per staff with [facility], and encouraged family members to come to the hospital to be with her. CCM spoke with daughter after me. Returned to room and family are present speaking with CCM and nursing. Family would like time to decide what to do moving forward.   Length of Stay: 9  Current Medications: Scheduled Meds:  . amiodarone  150 mg Intravenous Once  . Chlorhexidine Gluconate Cloth  6 each Topical Q0600  . clotrimazole  10 mg Oral TID  . docusate  100 mg Per Tube  BID  . epoetin (EPOGEN/PROCRIT) injection  10,000 Units Subcutaneous Q T,Th,Sa-HD  . heparin injection (subcutaneous)  5,000 Units Subcutaneous Q12H  . hydrocortisone sod succinate (SOLU-CORTEF) inj  100 mg Intravenous Q12H  . mouth rinse  15 mL Mouth Rinse BID  . midodrine  5 mg Per Tube TID WC  . pantoprazole (PROTONIX) IV  40 mg Intravenous BID  . polyethylene glycol  17 g Per Tube Daily  . sennosides  10 mL Per Tube BID  . sodium chloride flush  3 mL Intravenous Q12H    Continuous Infusions: . sodium chloride 250 mL (  08/19/19 1927)  . sodium chloride    . amiodarone 60 mg/hr (2019-09-02 0619)  . prismasol BGK 2/2.5 dialysis solution 2,000 mL/hr at 08/21/19 0358  . prismasol BGK 2/2.5 replacement solution 200 mL/hr at 08/20/19 1222  . prismasol BGK 2/2.5 replacement solution 200 mL/hr at 08/20/19 1222  . vancomycin Stopped (08/21/19 2100)    PRN Meds: sodium chloride, acetaminophen, heparin, heparin, metoprolol tartrate, morphine injection, sodium chloride, sodium chloride flush  Physical Exam Pulmonary:     Effort: Respiratory distress present.  Neurological:     Mental Status: She is alert.             Vital Signs: BP 119/74   Pulse 89   Temp 98 F (36.7 C) (Oral)   Resp (!) 29   Ht 5\' 6"  (1.676 m)   Wt 97.3 kg   SpO2 96%   BMI 34.62 kg/m  SpO2: SpO2: 96 % O2 Device: O2 Device: Nasal Cannula O2 Flow Rate: O2 Flow Rate (L/min): 4 L/min  Intake/output summary:   Intake/Output Summary (Last 24 hours) at 09-02-2019 [id number] Last data filed at 09/02/2019 0900 Gross per 24 hour  Intake 1753.96 ml  Output 624.3 ml  Net 1129.66 ml   LBM: Last BM Date: (PTA) Baseline Weight: Weight: 101 kg Most recent weight: Weight: 97.3 kg       Palliative Assessment/Data:      Patient Active Problem List   Diagnosis Date Noted  . Unstable angina ([place])   . Chest pain 07/06/2019  . Acute on chronic respiratory failure with hypoxemia ([place]) 06/19/2018  . Acute respiratory  failure ([place]) 06/19/2018  . Acute on chronic congestive heart failure ([place])   . ESRD on hemodialysis ([place])   . Palliative care by specialist   . Acute respiratory failure with hypoxemia ([place]) 04/18/2018  . Anemia of chronic disease 03/24/2018  . NSTEMI (non-ST elevated myocardial infarction) ([place]) 03/23/2018  . Acute on chronic diastolic CHF (congestive heart failure) ([place]) 03/02/2018  . GERD (gastroesophageal reflux disease) 03/02/2018  . Respiratory failure ([place]) 01/11/2018  . Abdominal pain 06/28/2017  . Anxiety, generalized 06/27/2017  . COPD (chronic obstructive pulmonary disease) ([place]) 06/26/2017  . Peritonitis ([place]) 05/28/2017  . Bilateral carotid artery stenosis 03/08/2017  . ESRD on dialysis ([place]) 03/08/2017  . Non-ST elevation myocardial infarction (NSTEMI), subendocardial infarction, subsequent episode of care ([place]) 11/04/2016  . Onychogryphosis 10/31/2016  . Onychomycosis 10/31/2016  . Subungual exostosis 10/31/2016  . Type 2 diabetes mellitus with diabetic neuropathy ([place]) 10/31/2016  . Chronic osteomyelitis of left hand ([place]) 09/03/2016  . Left arm swelling 08/30/2016  . Pre-operative clearance 08/30/2016  . Coronary artery disease of native heart with stable angina pectoris ([place]) 08/03/2016  . Benign paroxysmal positional vertigo due to bilateral vestibular disorder 07/26/2016  . H/O: CVA (cerebrovascular accident) 07/26/2016  . Stable angina ([place]) 07/05/2016  . Sepsis due to pneumonia ([place]) 07/05/2016  . Fracture of left foot 04/26/2016  . Pre-transplant evaluation for kidney transplant 02/04/2016  . Chest pain at rest 12/17/2015  . Ischemic chest pain ([place]) 12/17/2015  . DJD of shoulder 10/21/2015  . Perianal lesion 05/20/2015  . 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 Lumbar radiculopathy 05/14/2015  . 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 Benign essential hypertension 04/22/2015  . Grief 03/18/2015  . Lumbosacral facet joint syndrome 02/16/2015  .  Greater trochanteric bursitis 02/16/2015  . DDD (degenerative disc disease), lumbosacral 01/20/2015  . 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  Osteoarthritis 01/20/2015  . IDA (iron deficiency anemia) 03/25/2014  . Thyroid nodule 01/15/2014  . Lung nodule 08/28/2013  . Tobacco abuse 05/17/2013  . Mixed hyperlipidemia 06/14/2011  . OSA on CPAP 06/14/2011  . PAD (peripheral artery disease) ([place]) 06/14/2011  . Depression 05/05/2011  . Chronic kidney disease (CKD), stage V ([place]) 03/26/2011  . Multiple vessel coronary artery disease 03/26/2011  . Obesity, unspecified 03/26/2011  . Type 2 diabetes mellitus ([place]) 03/26/2011    Palliative Care Assessment & Plan    Recommendations/Plan:  Full code/full scope.    Code Status:    Code Status Orders  (From admission, onward)         Start     Ordered   08/15/2019 1648  Full code  Continuous     08/09/2019 1650        Code Status History    Date Active Date Inactive Code Status Order ID Comments User Context   08/06/2019 0101 08/07/2019 1637 DNR [id number]  Lenore Cordia, MD ED   07/06/2019 1351 07/08/2019 1714 DNR [id number]  Vaughan Basta, MD ED   06/19/2018 0854 06/21/2018 2113 DNR [id number]  Arta Silence, MD ED   05/29/2018 0618 05/30/2018 2328 DNR [id number]  Harrie Foreman, MD ED   04/18/2018 0250 04/19/2018 1925 DNR [id number]  Harrie Foreman, MD ED   03/23/2018 0955 03/23/2018 2048 DNR [id number]  Loletha Grayer, MD Inpatient   03/23/2018 0500 03/23/2018 0955 Full Code [id number]  Arta Silence, MD Inpatient   03/20/2018 0605 03/21/2018 1313 Full Code [id number]  Harrie Foreman, MD Inpatient   03/02/2018 0313 03/03/2018 1952 Full Code [id number]  Lance Coon, MD ED   02/26/2018 0716 02/27/2018 1821 Full Code [id number]  Arta Silence, MD Inpatient   01/11/2018 2030 01/14/2018 1509 Full Code [id number]  Saundra Shelling, MD Inpatient   12/25/2017 0835 12/25/2017 1743 Full Code  [id number]  Teodoro Spray, MD Inpatient   12/23/2017 0927 12/25/2017 0834 Full Code [id number]  Harrie Foreman, MD Inpatient   12/08/2017 0357 12/08/2017 2344 Full Code [id number]  Harrie Foreman, MD Inpatient   06/28/2017 0749 07/01/2017 2218 Full Code [id number]  Harrie Foreman, MD Inpatient   07/19/2016 0857 07/19/2016 1319 Full Code [id number]  Corey Skains, MD Inpatient   07/05/2016 2236 07/09/2016 1950 Full Code [id number]  Harvie Bridge, DO Inpatient   12/17/2015 [id number] 12/20/2015 1957 Full Code [id number]  Saundra Shelling, MD Inpatient   Advance Care Planning Activity       Prognosis:  Very poor overall.     Care plan was discussed with CCM and RN.   Thank you for allowing the Palliative Medicine Team to assist in the care of this patient.   Time In: 9:30 11:30 Time Out: 10:20 12:00 Total Time 50 30 80 min total  Prolonged Time Billed yes      Greater than 50%  of this time was spent counseling and coordinating care related to the above assessment and plan.  Asencion Gowda, NP  Please contact Palliative Medicine Team phone at [phone] for questions and concerns.

## 2019-09-20 NOTE — Death Summary Note (Signed)
DEATH SUMMARY NOTE    Name: Kimberly Shaffer MRN: [id number] DOB: 1951/03/08     LOS: 59   SUBJECTIVE FINDINGS & SIGNIFICANT EVENTS   Patient description:   Kimberly Shaffer is an 69 y.o. femalewith past medical history of CAD status post CABG, hypertension, hyperlipidemia, diabetes, COPDon 2L [place], and ESRD on HD who presents to the ED complaining of shortness of breath and generalized weakness -patient with severe hypoxia progressive resp failure placed on BiPAP She has ESRD on HD last HD was Saturday Failed BiPAP and now intubated and placed on MV support .  12/3 - patient had deteriorated with respiratory failure and poorly responsive mental status. She was emergently intubated and palced on MV.  After appx 30 min she had loss of pulse with PEA required ACLS and ROSC was achieved after appx 5 min. We had discussed this with family and they came in to have conference with palliative care and myself.  Multiple family members discussed patients wishes and wished to proceed with comfort measures with compassiane weaning from ventilator.   Patient was extubated with family at bedside. She passed away at 1327, may she rest in peace.   PAST MEDICAL HISTORY   Past Medical History:  Diagnosis Date  . Asthma   . CAD (coronary artery disease)   . Chronic lower back pain   . COPD (chronic obstructive pulmonary disease) ([place])   . Depression   . Diabetes mellitus without complication (HCC)    NIDD  . H/O angina pectoris   . H/O blood clots   . Hypercholesteremia   . Hypertension   . Left rotator cuff tear   . Myocardial infarction ([place])   . Obesity   . Renal insufficiency   . Stroke ([place])   . Vertigo, aural      SURGICAL HISTORY   Past Surgical History:  Procedure Laterality Date  . A/V FISTULAGRAM Left  03/14/2017   Procedure: A/V Fistulagram;  Surgeon: Katha Cabal, MD;  Location: [facility];  Service: Cardiovascular;  Laterality: Left;  . AV FISTULA PLACEMENT Left 11-04-2015  . AV FISTULA PLACEMENT Left 11/04/2015   Procedure: ARTERIOVENOUS (AV) FISTULA CREATION  ( BRACHIAL CEPHALIC );  Surgeon: Katha Cabal, MD;  Location: [facility];  Service: Vascular;  Laterality: Left;  . CARDIAC CATHETERIZATION    . CARDIAC CATHETERIZATION Left 07/19/2016   Procedure: Left Heart Cath and Coronary Angiography;  Surgeon: Corey Skains, MD;  Location: [facility];  Service: Cardiovascular;  Laterality: Left;  . CORONARY ARTERY BYPASS GRAFT  2012  . CORONARY STENT PLACEMENT    . JOINT REPLACEMENT    . KNEE SURGERY Bilateral   . LEFT HEART CATH AND CORONARY ANGIOGRAPHY N/A 12/25/2017   Procedure: LEFT HEART CATH AND CORONARY ANGIOGRAPHY;  Surgeon: Teodoro Spray, MD;  Location: [place];  Service: Cardiovascular;  Laterality: N/A;  . LEFT HEART CATH AND CORONARY ANGIOGRAPHY Right 03/23/2018   Procedure: LEFT HEART CATH AND CORONARY ANGIOGRAPHY;  Surgeon: Dionisio David, MD;  Location: [place] [facility];  Service: Cardiovascular;  Laterality: Right;  . LEFT HEART CATH AND CORONARY ANGIOGRAPHY N/A 05/30/2018   Procedure: LEFT HEART CATH AND CORONARY ANGIOGRAPHY;  Surgeon: Corey Skains, MD;  Location: [place] [facility];  Service: Cardiovascular;  Laterality: N/A;  . PERIPHERAL VASCULAR CATHETERIZATION N/A 11/13/2015   Procedure: Dialysis/Perma Catheter Insertion;  Surgeon: Katha Cabal, MD;  Location: [facility];  Service: Cardiovascular;  Laterality: N/A;  . PERIPHERAL VASCULAR CATHETERIZATION N/A 12/04/2015   Procedure: Dialysis/Perma Catheter Insertion;  Surgeon: Katha Cabal, MD;  Location: [facility];  Service: Cardiovascular;  Laterality: N/A;  . PERIPHERAL VASCULAR CATHETERIZATION Left 12/31/2015   Procedure: A/V  Shuntogram/Fistulagram;  Surgeon: Algernon Huxley, MD;  Location: [place];  Service: Cardiovascular;  Laterality: Left;  . PERIPHERAL VASCULAR CATHETERIZATION N/A 12/31/2015   Procedure: A/V Shunt Intervention;  Surgeon: Algernon Huxley, MD;  Location: [place] [facility];  Service: Cardiovascular;  Laterality: N/A;  . PERIPHERAL VASCULAR CATHETERIZATION Left 02/25/2016   Procedure: A/V Shuntogram/Fistulagram;  Surgeon: Algernon Huxley, MD;  Location: [place] [facility];  Service: Cardiovascular;  Laterality: Left;  . PERIPHERAL VASCULAR CATHETERIZATION N/A 03/18/2016   Procedure: Dialysis/Perma Catheter Removal;  Surgeon: Katha Cabal, MD;  Location: [facility];  Service: Cardiovascular;  Laterality: N/A;  . REMOVAL OF A DIALYSIS CATHETER N/A 06/30/2017   Procedure: REMOVAL OF A DIALYSIS CATHETER;  Surgeon: Katha Cabal, MD;  Location: [facility];  Service: Vascular;  Laterality: N/A;  . REPLACEMENT TOTAL KNEE BILATERAL Bilateral      FAMILY HISTORY   Family History  Problem Relation Age of Onset  . Cancer Mother   . Cancer Father   . Diabetes Brother   . Heart disease Brother      SOCIAL HISTORY   Social History   Tobacco Use  . Smoking status: Current Every Day Smoker    Packs/day: 0.25    Types: Cigarettes  . Smokeless tobacco: Never Used  Substance Use Topics  . Alcohol use: No    Alcohol/week: 0.0 standard drinks  . Drug use: No     MEDICATIONS   Current Medication:  Current Facility-Administered Medications:  .  0.9 %  sodium chloride infusion, 250 mL, Intravenous, PRN, Flora Lipps, MD, Last Rate: 5 mL/hr at 08/19/19 1927, 250 mL at 08/19/19 1927 .  0.9 %  sodium chloride infusion, 250 mL, Intravenous, Continuous, Jerimah Witucki, MD .  acetaminophen (TYLENOL) tablet 650 mg, 650 mg, Oral, Q4H PRN, Flora Lipps, MD, 650 mg at 08/19/19 0752 .  amiodarone (NEXTERONE) 1.8 mg/mL load via infusion 150 mg, 150 mg, Intravenous, Once **FOLLOWED BY**  [EXPIRED] amiodarone (NEXTERONE PREMIX) 360-4.14 MG/200ML-% (1.8 mg/mL) IV infusion, 60 mg/hr, Intravenous, Continuous, Last Rate: 16.67 mL/hr at 08/21/19 1956, 30 mg/hr at 08/21/19 1956 **FOLLOWED BY** amiodarone (NEXTERONE PREMIX) 360-4.14 MG/200ML-% (1.8 mg/mL) IV infusion, 60 mg/hr, Intravenous, Continuous, Blakeney, Dana G, NP, Last Rate: 33.3 mL/hr at 09/12/19 0619, 60 mg/hr at 09/12/2019 0619 .  Chlorhexidine Gluconate Cloth 2 % PADS 6 each, 6 each, Topical, Q0600, Flora Lipps, MD, 6 each at 09/12/2019 0501 .  clotrimazole (MYCELEX) troche 10 mg, 10 mg, Oral, TID, Lanney Gins, Lakeria Starkman, MD .  DOBUTamine (DOBUTREX) infusion 4000 mcg/mL, 2.5-20 mcg/kg/min, Intravenous, Titrated, Twinkle Sockwell, MD, Last Rate: 7.3 mL/hr at September 12, 2019 1205, 5 mcg/kg/min at 09-12-2019 1205 .  docusate (COLACE) 50 MG/5ML liquid 100 mg, 100 mg, Per Tube, BID, Lanney Gins, Latravion Graves, MD .  epoetin alfa (EPOGEN) injection 10,000 Units, 10,000 Units, Subcutaneous, Q T,Th,Sa-HD, Kolluru, Sarath, MD .  fentaNYL (SUBLIMAZE) 100 MCG/2ML injection, , , ,  .  fentaNYL (SUBLIMAZE) bolus via infusion 25 mcg, 25 mcg, Intravenous, Q15 min PRN, Lanney Gins, Amal Renbarger, MD .  fentaNYL 2541mcg in NS 288mL (15mcg/ml) infusion-PREMIX, 25-200 mcg/hr, Intravenous, Continuous, Markiesha Delia, MD, Last Rate: 10 mL/hr at 12-Sep-2019 1120, 100 mcg/hr at 12-Sep-2019 1120 .  heparin injection 1,000-6,000 Units, 1,000-6,000 Units, Intravenous, PRN, Awilda Bill, NP .  heparin injection 1,000-6,000 Units, 1,000-6,000 Units, CRRT, PRN, Kolluru, Sarath, MD, 1,400 Units at 08/21/19 0917 .  heparin injection 5,000 Units, 5,000 Units, Subcutaneous, Q12H, Tyler Pita, MD, 5,000 Units at 08/21/19 2139 .  hydrocortisone sodium succinate (SOLU-CORTEF) 100 MG injection 100 mg, 100 mg, Intravenous, Q12H, Ottie Glazier, MD, 100 mg at 09-07-2019 0149 .  LORazepam (ATIVAN) tablet 0.5 mg, 0.5 mg, Per Tube, Q6H, Prem Coykendall, MD .  MEDLINE mouth rinse, 15 mL, Mouth Rinse, BID,  Samar Venneman, MD, 15 mL at 08/21/19 2127 .  metoprolol tartrate (LOPRESSOR) injection 2.5-5 mg, 2.5-5 mg, Intravenous, Q6H PRN, Awilda Bill, NP .  midazolam (VERSED) injection 1 mg, 1 mg, Intravenous, Q15 min PRN, Ottie Glazier, MD .  midazolam (VERSED) injection 1 mg, 1 mg, Intravenous, Q2H PRN, Lanney Gins, Novella Abraha, MD .  midodrine (PROAMATINE) tablet 5 mg, 5 mg, Per Tube, TID WC, Lanney Gins, Dazhane Villagomez, MD, 5 mg at 08/21/19 1019 .  morphine 2 MG/ML injection 1-2 mg, 1-2 mg, Intravenous, Q4H PRN, Awilda Bill, NP, 2 mg at 09/07/19 0943 .  norepinephrine (LEVOPHED) 16 mg in 221mL premix infusion, 0-40 mcg/min, Intravenous, Titrated, Darren Caldron, MD, Last Rate: 65.6 mL/hr at 09/07/2019 1116, 70 mcg/min at 09/07/19 1116 .  pantoprazole (PROTONIX) injection 40 mg, 40 mg, Intravenous, BID, Tyler Pita, MD, 40 mg at 08/21/19 2139 .  polyethylene glycol (MIRALAX / GLYCOLAX) packet 17 g, 17 g, Per Tube, Daily, Lanney Gins, Hollie Wojahn, MD, 17 g at 08/21/19 1018 .  prismasol BGK 2/2.5 dialysis solution, , CRRT, Continuous, Kolluru, Sarath, MD, Last Rate: 2,000 mL/hr at 08/21/19 0358 .  prismasol BGK 2/2.5 replacement solution, , CRRT, Continuous, Kolluru, Sarath, MD, Last Rate: 200 mL/hr at 08/20/19 1222 .  prismasol BGK 2/2.5 replacement solution, , CRRT, Continuous, Kolluru, Sarath, MD, Last Rate: 200 mL/hr at 08/20/19 1222 .  rocuronium (ZEMURON) 50 MG/5ML injection, , , ,  .  sennosides (SENOKOT) 8.8 MG/5ML syrup 10 mL, 10 mL, Per Tube, BID, Tabius Rood, MD .  sodium chloride 0.9 % primer fluid for CRRT, , CRRT, PRN, Kolluru, Sarath, MD .  sodium chloride flush (NS) 0.9 % injection 3 mL, 3 mL, Intravenous, Q12H, Kasa, Kurian, MD, 3 mL at 08/21/19 2127 .  sodium chloride flush (NS) 0.9 % injection 3 mL, 3 mL, Intravenous, PRN, Mortimer Fries, Kurian, MD .  vancomycin (VANCOCIN) IVPB 1000 mg/200 mL premix, 1,000 mg, Intravenous, Q24H, [name], RPH, Stopped at 08/21/19 2100 .  vasopressin  (PITRESSIN) 40 Units in sodium chloride 0.9 % 250 mL (0.16 Units/mL) infusion, 0.03 Units/min, Intravenous, Continuous, Lanney Gins, Kacelyn Rowzee, MD, Last Rate: 11.25 mL/hr at 09-07-19 1129, 0.03 Units/min at 2019-09-07 1129    ALLERGIES   Nystatin, Sulfa antibiotics, and Niaspan [niacin]    REVIEW OF SYSTEMS     Unable to obtain while critically ill on mechanical ventilation  PHYSICAL EXAMINATION   Vital Signs: Temp:  [98 F (36.7 C)-99.9 F (37.7 C)] 98 F (36.7 C) (12/03 0730) Pulse Rate:  [25-129] 104 (12/03 1030) Resp:  [16-32] 26 (12/03 1300) BP: (42-147)/(18-90) 100/49 (12/03 1205) SpO2:  [63 %-100 %] 63 % (12/03 1030) FiO2 (%):  [100 %] 100 % (12/03 1130) Weight:  [97.3 kg] 97.3 kg (12/03 0500)  GENERAL:age appropriate , sedation stopped HEAD: Normocephalic, atraumatic.  EYES: Pupils equal, round, reactive to light.  No scleral icterus.  MOUTH: Moist mucosal membrane. NECK: Supple. No thyromegaly. No nodules.  No JVD.  PULMONARY: MV breath sounds without rhonchi CARDIOVASCULAR: S1 and S2. Regular rate and rhythm. No murmurs, rubs, or gallops.  GASTROINTESTINAL: Soft, nontender, non-distended. No masses. Positive bowel sounds. No hepatosplenomegaly.  MUSCULOSKELETAL: No swelling, clubbing, or edema.  NEUROLOGIC: sedated on MV SKIN:intact,warm,dry   PERTINENT DATA     Infusions: . sodium chloride 250 mL (08/19/19 1927)  . sodium chloride    . amiodarone 60 mg/hr (September 16, 2019 0619)  . DOBUTamine 5 mcg/kg/min (2019-09-16 1205)  . fentaNYL infusion INTRAVENOUS 100 mcg/hr (09/16/2019 1120)  . norepinephrine (LEVOPHED) Adult infusion 70 mcg/min (Sep 16, 2019 1116)  . prismasol BGK 2/2.5 dialysis solution 2,000 mL/hr at 08/21/19 0358  . prismasol BGK 2/2.5 replacement solution 200 mL/hr at 08/20/19 1222  . prismasol BGK 2/2.5 replacement solution 200 mL/hr at 08/20/19 1222  . vancomycin Stopped (08/21/19 2100)  . vasopressin (PITRESSIN) infusion - *FOR SHOCK* 0.03 Units/min  (Sep 16, 2019 1129)   Scheduled Medications: . amiodarone  150 mg Intravenous Once  . Chlorhexidine Gluconate Cloth  6 each Topical Q0600  . clotrimazole  10 mg Oral TID  . docusate  100 mg Per Tube BID  . epoetin (EPOGEN/PROCRIT) injection  10,000 Units Subcutaneous Q T,Th,Sa-HD  . fentaNYL      . heparin injection (subcutaneous)  5,000 Units Subcutaneous Q12H  . hydrocortisone sod succinate (SOLU-CORTEF) inj  100 mg Intravenous Q12H  . LORazepam  0.5 mg Per Tube Q6H  . mouth rinse  15 mL Mouth Rinse BID  . midodrine  5 mg Per Tube TID WC  . pantoprazole (PROTONIX) IV  40 mg Intravenous BID  . polyethylene glycol  17 g Per Tube Daily  . rocuronium      . sennosides  10 mL Per Tube BID  . sodium chloride flush  3 mL Intravenous Q12H   PRN Medications: sodium chloride, acetaminophen, fentaNYL, heparin, heparin, metoprolol tartrate, midazolam, midazolam, morphine injection, sodium chloride, sodium chloride flush Hemodynamic parameters:   Intake/Output: 12/02 0701 - 12/03 0700 In: 2877.8 [I.V.:877.8; IV Piggyback:2000] Out: 622.3   Ventilator  Settings: Vent Mode: PRVC FiO2 (%):  [100 %] 100 % Set Rate:  [16 bmp-26 bmp] 26 bmp Vt Set:  [450 mL] 450 mL PEEP:  [5 cmH20] 5 cmH20    LAB RESULTS:  Basic Metabolic Panel: Recent Labs  Lab 08/19/19 0552  08/20/19 0534  08/21/19 0542  08/21/19 1842 08/21/19 2140 09-16-2019 0158 09/16/19 0509 09/16/19 0929 2019-09-16 1131 September 16, 2019 1133  NA 139   < > 136   < > 137   < > 135 136 135 137 137 136  --   K 4.5   < > 4.8   < > 4.0   < > 4.0 4.0 4.1 4.1 4.6 5.7*  --   CL 96*   < > 97*   < > 100   < > 98 97* 97* 100 100 98  --   CO2 23   < > 22   < > 24   < > 24 23 23 22  18* 16*  --   GLUCOSE 157*   < > 176*   < > 159*   < > 132* 157* 114* 140* 117* 222*  --   BUN 78*   < > 102*   < > 56*   < > 45* 41* 46* 35* 33* 37*  --   CREATININE 6.31*   < > 7.10*   < > 2.92*   < > 2.20* 2.16* 2.61* 1.94* 1.90* 2.29*  --  CALCIUM 8.7*   < > 8.6*    < > 9.3   < > 9.2 9.1 8.8* 9.1 9.5 8.1*  --   MG 2.1  --  2.1  --  2.1  --   --   --   --  2.1  --   --  2.4  PHOS 5.7*   < > 5.5*   < > 2.9   < > 2.6 3.1 4.3 3.5 4.4  --   --    < > = values in this interval not displayed.   Liver Function Tests: Recent Labs  Lab 08/17/19 0504  08/21/19 2140 02-Sep-2019 0158 2019-09-02 0509 2019-09-02 0929 09-02-19 1131  AST 108*  --   --   --   --   --  5,604*  ALT 60*  --   --   --   --   --  2,640*  ALKPHOS 73  --   --   --   --   --  82  BILITOT 1.0  --   --   --   --   --  1.1  PROT 7.4  --   --   --   --   --  5.3*  ALBUMIN 3.2*   < > 2.6* 2.6* 2.6* 2.7* 1.9*   < > = values in this interval not displayed.   No results for input(s): LIPASE, AMYLASE in the last 168 hours. No results for input(s): AMMONIA in the last 168 hours. CBC: Recent Labs  Lab 08/18/19 0454 08/19/19 0552 08/20/19 0534 08/21/19 0542 08/21/19 1017 09-02-19 0509  WBC 15.6* 16.2* 23.5* 20.3*  --  18.0*  NEUTROABS 12.4*  --  20.2* 17.4*  --   --   HGB 8.6* 9.3* 8.6* 8.2* 7.2* 7.4*  HCT 27.7* 29.2* 26.6* 25.4* 22.2* 24.5*  MCV 103.0* 100.0 98.2 99.2  --  106.1*  PLT 180 168 137* 106*  --  105*   Cardiac Enzymes: No results for input(s): CKTOTAL, CKMB, CKMBINDEX, TROPONINI in the last 168 hours. BNP: Invalid input(s): POCBNP CBG: Recent Labs  Lab 2019/09/02 0329 09/02/2019 0717 09-02-2019 1036 09/02/2019 1103 September 02, 2019 1105  GLUCAP 98 93 54* 54* 90     IMAGING RESULTS:  Imaging: Dg Chest Port 1 View  Result Date: 09-02-2019 CLINICAL DATA:  Acute on chronic respiratory failure. EXAM: PORTABLE CHEST 1 VIEW COMPARISON:  Single-view of the chest 08/21/2019 and 08/19/2019. FINDINGS: NG tube and ET tube have been removed. Cardiomegaly and interstitial edema appear unchanged. No pneumothorax or pleural fluid. No acute or focal bony abnormality. IMPRESSION: No change in cardiomegaly and interstitial edema. Electronically Signed   By: Inge Rise M.D.   On: September 02, 2019  07:13   Dg Chest Port 1 View  Result Date: 08/21/2019 CLINICAL DATA:  Abnormal x-ray EXAM: PORTABLE CHEST 1 VIEW COMPARISON:  08/19/2019 FINDINGS: Endotracheal tube remains in good position. NG tube enters the stomach with the tip not visualized Cardiac enlargement. Progression of vascular congestion and bilateral airspace disease most likely edema. No effusion. IMPRESSION: Progression of congestive heart failure and edema. Endotracheal tube remains in good position. Electronically Signed   By: Franchot Gallo M.D.   On: 08/21/2019 10:48      ASSESSMENT AND PLAN    -Multidisciplinary rounds held today  PEA arrest -S/p ACLS with rosc -patient placed on ventilator -family conference with myself and palliative team - [place] discussion - advanced to comfort care with compassionate weaning from MV   Acute  Hypoxic Respiratory Failure -etiology is multifactorial including  -MRSA pneumonia, acute on chronic HFrEF, bibasilar atelectasis, fluid overload due to ESRD.  -continue Vancomycin per pharmD -treat atelectasis with MetaNEB while on ventilator and recruitment maneuvers via ventilator -ESRD will start CRRT     Acute on chronic exacerbation of systolic CHF with EF [id number] - diuresis has been difficult with severe renal failure -will continue CRRT ICU monitoring  ESRD -s/p evaluation by nephrology - patient not able to tolerate HD and requires CRRT as per renal team -Epogen 10K ongoing  -follow chem 7 -follow UO -continue Foley Catheter-assess need daily      Septic shock  - due to pneumonia with MRSA  -use vasopressors to keep MAP>65-currently on Neosynephrine -follow ABG and LA -Vancomycin IV per pharmacy -consider stress dose steroids-Solucortef -100BID   ID -continue IV abx as prescibed -follow up cultures  GI/Nutrition GI PROPHYLAXIS as indicated-protonix 40 IV DIET-->TF's as tolerated Constipation protocol as indicated  ENDO - ICU hypoglycemic\Hyperglycemia  protocol -check FSBS per protocol   ELECTROLYTES -follow labs as needed -replace as needed -pharmacy consultation   DVT/GI PRX ordered -heparin 5K q12h TRANSFUSIONS AS NEEDED MONITOR FSBS ASSESS the need for LABS as needed    Ottie Glazier, M.D.  Division of [place]

## 2019-09-20 NOTE — Progress Notes (Signed)
CRRT machine kept alarming negative access pressures. Blood returned, Cathflow instilled to arterial access. CRRT restarted.

## 2019-09-20 NOTE — Progress Notes (Signed)
CRITICAL CARE PROGRESS NOTE    Name: GUISELLE DALPORTO MRN: [id number] DOB: 01/24/1951     LOS: 57   SUBJECTIVE FINDINGS & SIGNIFICANT EVENTS   Patient description:   Kimberly Shaffer is an 69 y.o. femalewith past medical history of CAD status post CABG, hypertension, hyperlipidemia, diabetes, COPDon 2L [place], and ESRD on HD who presents to the ED complaining of shortness of breath and generalized weakness -patient with severe hypoxia progressive resp failure placed on BiPAP She has ESRD on HD last HD was Saturday Failed BiPAP and now intubated and placed on MV support  Lines / Drains: PIVx3  Cultures / Sepsis markers: Tracheal aspirate MRSA + Urine Cx - Staph aureus - 20K  Antibiotics: Vancomycin   Protocols / Consultants: Nephrology  Critical care    Tests / Events: CRRT   12/1 -Remains critically ill with septic shock, failed SBT 12/2 - Tachycardia this am, patient had high volume removal via CRRT (1.6L) documented incorrectly as net even, she also lost significant blood after this due to device malfunction and filter replacement X2 so overall tachycardia may be due to hypovolemia with intercurrent distributive shock physiology, will infuse LR 1L bolus to evaluate for fluid responsiveness. Patient has passed weaning trial and has been successfully liberated from mechanical ventilation.  12/3 - patient had deteriorated with respiratory failure and poorly responsive mental status. She was emergently intubated and palced on MV.  After appx 30 min she had loss of pulse with PEA required ACLS and ROSC was achieved after appx 5 min. We had discussed this with family and they came in to have conference with palliative care and myself.  Multiple family members discussed patients wishes and wished to proceed with  comfort measures with compassiane weaning from ventilator.   PAST MEDICAL HISTORY   Past Medical History:  Diagnosis Date  . Asthma   . CAD (coronary artery disease)   . Chronic lower back pain   . COPD (chronic obstructive pulmonary disease) ([facility])   . Depression   . Diabetes mellitus without complication (HCC)    NIDD  . H/O angina pectoris   . H/O blood clots   . Hypercholesteremia   . Hypertension   . Left rotator cuff tear   . Myocardial infarction ([place])   . Obesity   . Renal insufficiency   . Stroke ([place])   . Vertigo, aural      SURGICAL HISTORY   Past Surgical History:  Procedure Laterality Date  . A/V FISTULAGRAM Left 03/14/2017   Procedure: A/V Fistulagram;  Surgeon: Katha Cabal, MD;  Location: [place] [facility];  Service: Cardiovascular;  Laterality: Left;  . AV FISTULA PLACEMENT Left 11-04-2015  . AV FISTULA PLACEMENT Left 11/04/2015   Procedure: ARTERIOVENOUS (AV) FISTULA CREATION  ( BRACHIAL CEPHALIC );  Surgeon: Katha Cabal, MD;  Location: [facility];  Service: Vascular;  Laterality: Left;  . CARDIAC CATHETERIZATION    . CARDIAC CATHETERIZATION Left 07/19/2016   Procedure: Left Heart Cath and Coronary Angiography;  Surgeon: Corey Skains, MD;  Location: [place] [facility];  Service: Cardiovascular;  Laterality: Left;  . CORONARY ARTERY BYPASS GRAFT  2012  . CORONARY STENT PLACEMENT    . JOINT REPLACEMENT    . KNEE SURGERY Bilateral   . LEFT HEART CATH AND CORONARY ANGIOGRAPHY N/A 12/25/2017   Procedure: LEFT HEART CATH AND CORONARY ANGIOGRAPHY;  Surgeon: Teodoro Spray, MD;  Location: [place] [facility];  Service: Cardiovascular;  Laterality:  N/A;  . LEFT HEART CATH AND CORONARY ANGIOGRAPHY Right 03/23/2018   Procedure: LEFT HEART CATH AND CORONARY ANGIOGRAPHY;  Surgeon: Dionisio David, MD;  Location: [place] [facility];  Service: Cardiovascular;  Laterality: Right;  . LEFT HEART CATH AND CORONARY ANGIOGRAPHY N/A 05/30/2018   Procedure:  LEFT HEART CATH AND CORONARY ANGIOGRAPHY;  Surgeon: Corey Skains, MD;  Location: [place] [facility];  Service: Cardiovascular;  Laterality: N/A;  . PERIPHERAL VASCULAR CATHETERIZATION N/A 11/13/2015   Procedure: Dialysis/Perma Catheter Insertion;  Surgeon: Katha Cabal, MD;  Location: [place] [facility];  Service: Cardiovascular;  Laterality: N/A;  . PERIPHERAL VASCULAR CATHETERIZATION N/A 12/04/2015   Procedure: Dialysis/Perma Catheter Insertion;  Surgeon: Katha Cabal, MD;  Location: [place] [facility];  Service: Cardiovascular;  Laterality: N/A;  . PERIPHERAL VASCULAR CATHETERIZATION Left 12/31/2015   Procedure: A/V Shuntogram/Fistulagram;  Surgeon: Algernon Huxley, MD;  Location: [place] [facility];  Service: Cardiovascular;  Laterality: Left;  . PERIPHERAL VASCULAR CATHETERIZATION N/A 12/31/2015   Procedure: A/V Shunt Intervention;  Surgeon: Algernon Huxley, MD;  Location: [place] [facility];  Service: Cardiovascular;  Laterality: N/A;  . PERIPHERAL VASCULAR CATHETERIZATION Left 02/25/2016   Procedure: A/V Shuntogram/Fistulagram;  Surgeon: Algernon Huxley, MD;  Location: [place] [facility];  Service: Cardiovascular;  Laterality: Left;  . PERIPHERAL VASCULAR CATHETERIZATION N/A 03/18/2016   Procedure: Dialysis/Perma Catheter Removal;  Surgeon: Katha Cabal, MD;  Location: [place] [facility];  Service: Cardiovascular;  Laterality: N/A;  . REMOVAL OF A DIALYSIS CATHETER N/A 06/30/2017   Procedure: REMOVAL OF A DIALYSIS CATHETER;  Surgeon: Katha Cabal, MD;  Location: [facility];  Service: Vascular;  Laterality: N/A;  . REPLACEMENT TOTAL KNEE BILATERAL Bilateral      FAMILY HISTORY   Family History  Problem Relation Age of Onset  . Cancer Mother   . Cancer Father   . Diabetes Brother   . Heart disease Brother      SOCIAL HISTORY   Social History   Tobacco Use  . Smoking status: Current Every Day Smoker    Packs/day: 0.25    Types: Cigarettes  . Smokeless  tobacco: Never Used  Substance Use Topics  . Alcohol use: No    Alcohol/week: 0.0 standard drinks  . Drug use: No     MEDICATIONS   Current Medication:  Current Facility-Administered Medications:  .  0.9 %  sodium chloride infusion, 250 mL, Intravenous, PRN, Flora Lipps, MD, Last Rate: 5 mL/hr at 08/19/19 1927, 250 mL at 08/19/19 1927 .  0.9 %  sodium chloride infusion, 250 mL, Intravenous, Continuous, Gaige Fussner, MD .  acetaminophen (TYLENOL) tablet 650 mg, 650 mg, Oral, Q4H PRN, Flora Lipps, MD, 650 mg at 08/19/19 0752 .  amiodarone (NEXTERONE) 1.8 mg/mL load via infusion 150 mg, 150 mg, Intravenous, Once **FOLLOWED BY** [EXPIRED] amiodarone (NEXTERONE PREMIX) 360-4.14 MG/200ML-% (1.8 mg/mL) IV infusion, 60 mg/hr, Intravenous, Continuous, Last Rate: 16.67 mL/hr at 08/21/19 1956, 30 mg/hr at 08/21/19 1956 **FOLLOWED BY** amiodarone (NEXTERONE PREMIX) 360-4.14 MG/200ML-% (1.8 mg/mL) IV infusion, 60 mg/hr, Intravenous, Continuous, Blakeney, Dana G, NP, Last Rate: 33.3 mL/hr at 09/19/19 0619, 60 mg/hr at 09-19-19 0619 .  Chlorhexidine Gluconate Cloth 2 % PADS 6 each, 6 each, Topical, Q0600, Flora Lipps, MD, 6 each at 19-Sep-2019 0501 .  clotrimazole (MYCELEX) troche 10 mg, 10 mg, Oral, TID, Lanney Gins, Tyrah Broers, MD .  docusate (COLACE) 50 MG/5ML liquid 100 mg, 100 mg, Per Tube, BID, Elmin Wiederholt,  MD .  epoetin alfa (EPOGEN) injection 10,000 Units, 10,000 Units, Subcutaneous, Q T,Th,Sa-HD, Kolluru, Sarath, MD .  heparin injection 1,000-6,000 Units, 1,000-6,000 Units, Intravenous, PRN, Awilda Bill, NP .  heparin injection 1,000-6,000 Units, 1,000-6,000 Units, CRRT, PRN, Kolluru, Sarath, MD, 1,400 Units at 08/21/19 0917 .  heparin injection 5,000 Units, 5,000 Units, Subcutaneous, Q12H, Tyler Pita, MD, 5,000 Units at 08/21/19 2139 .  hydrocortisone sodium succinate (SOLU-CORTEF) 100 MG injection 100 mg, 100 mg, Intravenous, Q12H, Ottie Glazier, MD, 100 mg at 08/30/19 0149 .   MEDLINE mouth rinse, 15 mL, Mouth Rinse, BID, Missey Hasley, MD, 15 mL at 08/21/19 2127 .  metoprolol tartrate (LOPRESSOR) injection 2.5-5 mg, 2.5-5 mg, Intravenous, Q6H PRN, Awilda Bill, NP .  midodrine (PROAMATINE) tablet 5 mg, 5 mg, Per Tube, TID WC, Lanney Gins, Cary Wilford, MD, 5 mg at 08/21/19 1019 .  morphine 2 MG/ML injection 1-2 mg, 1-2 mg, Intravenous, Q4H PRN, Awilda Bill, NP, 2 mg at 30-Aug-2019 0943 .  pantoprazole (PROTONIX) injection 40 mg, 40 mg, Intravenous, BID, Tyler Pita, MD, 40 mg at 08/21/19 2139 .  polyethylene glycol (MIRALAX / GLYCOLAX) packet 17 g, 17 g, Per Tube, Daily, Lanney Gins, Kashaun Bebo, MD, 17 g at 08/21/19 1018 .  prismasol BGK 2/2.5 dialysis solution, , CRRT, Continuous, Kolluru, Sarath, MD, Last Rate: 2,000 mL/hr at 08/21/19 0358 .  prismasol BGK 2/2.5 replacement solution, , CRRT, Continuous, Kolluru, Sarath, MD, Last Rate: 200 mL/hr at 08/20/19 1222 .  prismasol BGK 2/2.5 replacement solution, , CRRT, Continuous, Kolluru, Sarath, MD, Last Rate: 200 mL/hr at 08/20/19 1222 .  sennosides (SENOKOT) 8.8 MG/5ML syrup 10 mL, 10 mL, Per Tube, BID, Tasman Zapata, MD .  sodium chloride 0.9 % primer fluid for CRRT, , CRRT, PRN, Kolluru, Sarath, MD .  sodium chloride flush (NS) 0.9 % injection 3 mL, 3 mL, Intravenous, Q12H, Kasa, Kurian, MD, 3 mL at 08/21/19 2127 .  sodium chloride flush (NS) 0.9 % injection 3 mL, 3 mL, Intravenous, PRN, Flora Lipps, MD .  vancomycin (VANCOCIN) IVPB 1000 mg/200 mL premix, 1,000 mg, Intravenous, Q24H, [name], RPH, Stopped at 08/21/19 2100    ALLERGIES   Nystatin, Sulfa antibiotics, and Niaspan [niacin]    REVIEW OF SYSTEMS     Unable to obtain while critically ill on mechanical ventilation  PHYSICAL EXAMINATION   Vital Signs: Temp:  [98 F (36.7 C)-99.9 F (37.7 C)] 98 F (36.7 C) (12/03 0730) Pulse Rate:  [25-129] 89 (12/03 0800) Resp:  [18-32] 29 (12/03 0800) BP: (92-132)/(58-90) 119/74 (12/03 0800)  SpO2:  [94 %-100 %] 96 % (12/03 0800) FiO2 (%):  [28 %] 28 % (12/02 1111) Weight:  [97.3 kg] 97.3 kg (12/03 0500)  GENERAL:age appropriate , sedation stopped HEAD: Normocephalic, atraumatic.  EYES: Pupils equal, round, reactive to light.  No scleral icterus.  MOUTH: Moist mucosal membrane. NECK: Supple. No thyromegaly. No nodules. No JVD.  PULMONARY: MV breath sounds without rhonchi CARDIOVASCULAR: S1 and S2. Regular rate and rhythm. No murmurs, rubs, or gallops.  GASTROINTESTINAL: Soft, nontender, non-distended. No masses. Positive bowel sounds. No hepatosplenomegaly.  MUSCULOSKELETAL: No swelling, clubbing, or edema.  NEUROLOGIC: sedated on MV SKIN:intact,warm,dry   PERTINENT DATA     Infusions: . sodium chloride 250 mL (08/19/19 1927)  . sodium chloride    . amiodarone 60 mg/hr (August 30, 2019 0619)  . prismasol BGK 2/2.5 dialysis solution 2,000 mL/hr at 08/21/19 0358  . prismasol BGK 2/2.5 replacement solution 200 mL/hr at 08/20/19 1222  .  prismasol BGK 2/2.5 replacement solution 200 mL/hr at 08/20/19 1222  . vancomycin Stopped (08/21/19 2100)   Scheduled Medications: . amiodarone  150 mg Intravenous Once  . Chlorhexidine Gluconate Cloth  6 each Topical Q0600  . clotrimazole  10 mg Oral TID  . docusate  100 mg Per Tube BID  . epoetin (EPOGEN/PROCRIT) injection  10,000 Units Subcutaneous Q T,Th,Sa-HD  . heparin injection (subcutaneous)  5,000 Units Subcutaneous Q12H  . hydrocortisone sod succinate (SOLU-CORTEF) inj  100 mg Intravenous Q12H  . mouth rinse  15 mL Mouth Rinse BID  . midodrine  5 mg Per Tube TID WC  . pantoprazole (PROTONIX) IV  40 mg Intravenous BID  . polyethylene glycol  17 g Per Tube Daily  . sennosides  10 mL Per Tube BID  . sodium chloride flush  3 mL Intravenous Q12H   PRN Medications: sodium chloride, acetaminophen, heparin, heparin, metoprolol tartrate, morphine injection, sodium chloride, sodium chloride flush Hemodynamic parameters:    Intake/Output: 12/02 0701 - 12/03 0700 In: 2877.8 [I.V.:877.8; IV Piggyback:2000] Out: 622.3   Ventilator  Settings: Vent Mode: Spontaneous FiO2 (%):  [28 %] 28 % PEEP:  [5 cmH20] 5 cmH20 Pressure Support:  [10 cmH20] 10 cmH20    LAB RESULTS:  Basic Metabolic Panel: Recent Labs  Lab 08/17/19 0504  08/19/19 0552  08/20/19 0534  08/21/19 0542  08/21/19 1318 08/21/19 1842 08/21/19 2140 09-04-2019 0158 09-04-19 0509  NA 136   < > 139   < > 136   < > 137   < > 136 135 136 135 137  K 3.7   < > 4.5   < > 4.8   < > 4.0   < > 3.7 4.0 4.0 4.1 4.1  CL 93*   < > 96*   < > 97*   < > 100   < > 99 98 97* 97* 100  CO2 23   < > 23   < > 22   < > 24   < > 24 24 23 23 22   GLUCOSE 99   < > 157*   < > 176*   < > 159*   < > 128* 132* 157* 114* 140*  BUN 56*   < > 78*   < > 102*   < > 56*   < > 53* 45* 41* 46* 35*  CREATININE 6.26*   < > 6.31*   < > 7.10*   < > 2.92*   < > 2.52* 2.20* 2.16* 2.61* 1.94*  CALCIUM 8.6*   < > 8.7*   < > 8.6*   < > 9.3   < > 9.0 9.2 9.1 8.8* 9.1  MG 2.0  --  2.1  --  2.1  --  2.1  --   --   --   --   --  2.1  PHOS 7.2*   < > 5.7*   < > 5.5*   < > 2.9   < > 2.7 2.6 3.1 4.3 3.5   < > = values in this interval not displayed.   Liver Function Tests: Recent Labs  Lab 08/17/19 0504  08/21/19 1318 08/21/19 1842 08/21/19 2140 04-Sep-2019 0158 09/04/2019 0509  AST 108*  --   --   --   --   --   --   ALT 60*  --   --   --   --   --   --   ALKPHOS 73  --   --   --   --   --   --  BILITOT 1.0  --   --   --   --   --   --   PROT 7.4  --   --   --   --   --   --   ALBUMIN 3.2*   < > 2.5* 2.6* 2.6* 2.6* 2.6*   < > = values in this interval not displayed.   No results for input(s): LIPASE, AMYLASE in the last 168 hours. No results for input(s): AMMONIA in the last 168 hours. CBC: Recent Labs  Lab 08/18/19 0454 08/19/19 0552 08/20/19 0534 08/21/19 0542 08/21/19 1017 2019-09-20 0509  WBC 15.6* 16.2* 23.5* 20.3*  --  18.0*  NEUTROABS 12.4*  --  20.2* 17.4*  --   --    HGB 8.6* 9.3* 8.6* 8.2* 7.2* 7.4*  HCT 27.7* 29.2* 26.6* 25.4* 22.2* 24.5*  MCV 103.0* 100.0 98.2 99.2  --  106.1*  PLT 180 168 137* 106*  --  105*   Cardiac Enzymes: No results for input(s): CKTOTAL, CKMB, CKMBINDEX, TROPONINI in the last 168 hours. BNP: Invalid input(s): POCBNP CBG: Recent Labs  Lab 08/21/19 1600 08/21/19 2029 09-20-2019 0017 2019/09/20 0329 2019/09/20 0717  GLUCAP 110* 120* 112* 98 93     IMAGING RESULTS:  Imaging: Dg Chest Port 1 View  Result Date: Sep 20, 2019 CLINICAL DATA:  Acute on chronic respiratory failure. EXAM: PORTABLE CHEST 1 VIEW COMPARISON:  Single-view of the chest 08/21/2019 and 08/19/2019. FINDINGS: NG tube and ET tube have been removed. Cardiomegaly and interstitial edema appear unchanged. No pneumothorax or pleural fluid. No acute or focal bony abnormality. IMPRESSION: No change in cardiomegaly and interstitial edema. Electronically Signed   By: Inge Rise M.D.   On: 20-Sep-2019 07:13   Dg Chest Port 1 View  Result Date: 08/21/2019 CLINICAL DATA:  Abnormal x-ray EXAM: PORTABLE CHEST 1 VIEW COMPARISON:  08/19/2019 FINDINGS: Endotracheal tube remains in good position. NG tube enters the stomach with the tip not visualized Cardiac enlargement. Progression of vascular congestion and bilateral airspace disease most likely edema. No effusion. IMPRESSION: Progression of congestive heart failure and edema. Endotracheal tube remains in good position. Electronically Signed   By: Franchot Gallo M.D.   On: 08/21/2019 10:48      ASSESSMENT AND PLAN    -Multidisciplinary rounds held today  PEA arrest -S/p ACLS with rosc -patient placed on ventilator -family conference with myself and palliative team - [place] discussion - advanced to comfort care with compassionate weaning from MV   Acute Hypoxic Respiratory Failure -etiology is multifactorial including  -MRSA pneumonia, acute on chronic HFrEF, bibasilar atelectasis, fluid overload due to ESRD.   -continue Vancomycin per pharmD -treat atelectasis with MetaNEB while on ventilator and recruitment maneuvers via ventilator -ESRD will start CRRT     Acute on chronic exacerbation of systolic CHF with EF [id number] - diuresis has been difficult with severe renal failure -will continue CRRT ICU monitoring  ESRD -s/p evaluation by nephrology - patient not able to tolerate HD and requires CRRT as per renal team -Epogen 10K ongoing  -follow chem 7 -follow UO -continue Foley Catheter-assess need daily      Septic shock  - due to pneumonia with MRSA  -use vasopressors to keep MAP>65-currently on Neosynephrine -follow ABG and LA -Vancomycin IV per pharmacy -consider stress dose steroids-Solucortef -100BID   ID -continue IV abx as prescibed -follow up cultures  GI/Nutrition GI PROPHYLAXIS as indicated-protonix 40 IV DIET-->TF's as tolerated Constipation protocol as indicated  ENDO - ICU hypoglycemic\Hyperglycemia protocol -  check FSBS per protocol   ELECTROLYTES -follow labs as needed -replace as needed -pharmacy consultation   DVT/GI PRX ordered -heparin 5K q12h TRANSFUSIONS AS NEEDED MONITOR FSBS ASSESS the need for LABS as needed   Critical care provider statement:    Critical care time (minutes):  33   Critical care time was exclusive of:  Separately billable procedures and treating other patients   Critical care was necessary to treat or prevent imminent or life-threatening deterioration of the following conditions:  Septic shock, MRSA pneumonia, ESRD, acute hypoxemic respiratory failure, multiple comorbid conditions   Critical care was time spent personally by me on the following activities:  Development of treatment plan with patient or surrogate, discussions with consultants, evaluation of patient's response to treatment, examination of patient, obtaining history from patient or surrogate, ordering and performing treatments and interventions, ordering and  review of laboratory studies and re-evaluation of patient's condition.  I assumed direction of critical care for this patient from another provider in my specialty: no    This document was prepared using Dragon voice recognition software and may include unintentional dictation errors.    Ottie Glazier, M.D.  Division of [facility]

## 2019-09-20 DEATH — deceased

## 2020-10-06 IMAGING — CT CT ANGIO CHEST
2 of 6 series · 17 of 46 positions shown · IV contrast (APPLIED)
Comparison: Chest CTA 12/17/2015.

CLINICAL DATA: 68-year-old female with high probability of
pulmonary embolism. Chest pain with increasing shortness of breath
on exertion.

EXAM:
CT ANGIOGRAPHY CHEST WITH CONTRAST
TECHNIQUE: Multidetector CT imaging of the chest was performed using the
standard protocol during bolus administration of intravenous
contrast. Multiplanar CT image reconstructions and MIPs were
obtained to evaluate the vascular anatomy.
CONTRAST:  75mL OMNIPAQUE IOHEXOL 350 MG/ML SOLN

[Series 5: thins · axial · 0.72mm/px · z∈[-324,-88]mm · 14 of 260 slices shown]
[im 12/260  lung]
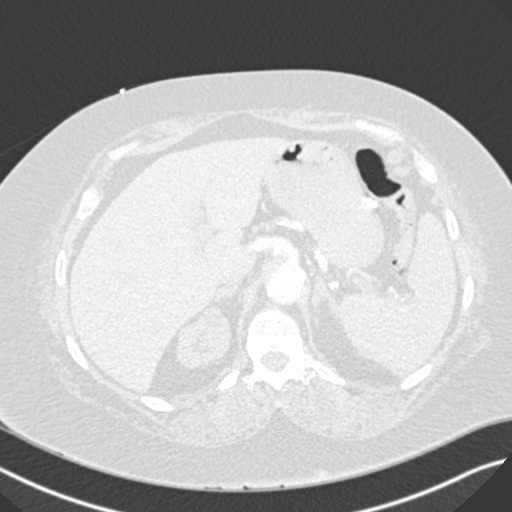
[im 34/260  soft-tissue]
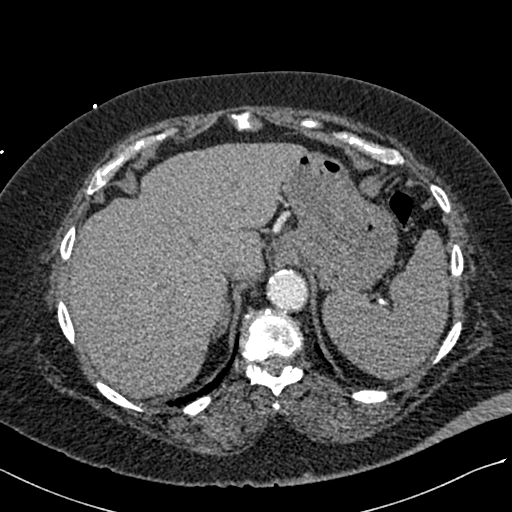
[im 46/260  lung]
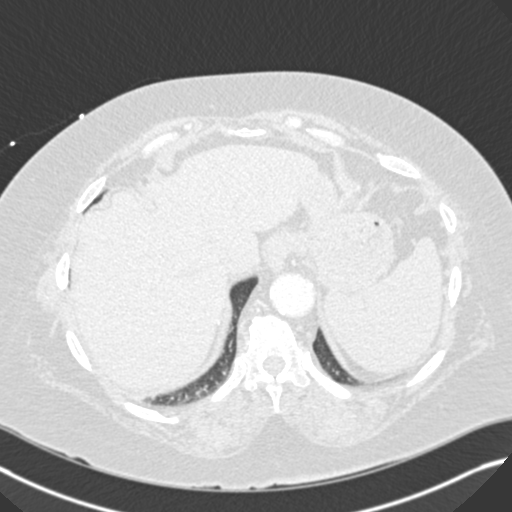
[im 68/260  soft-tissue]
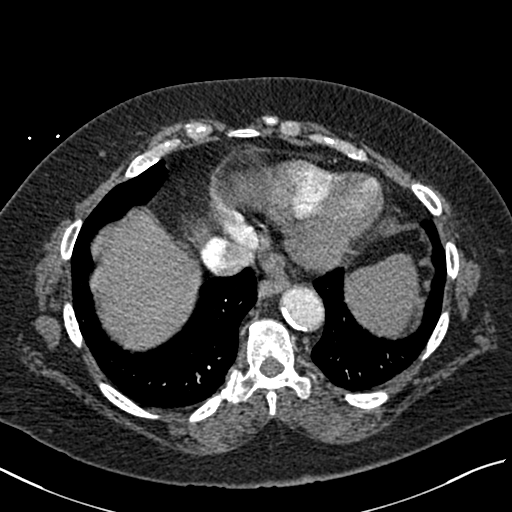
[im 91/260  lung]
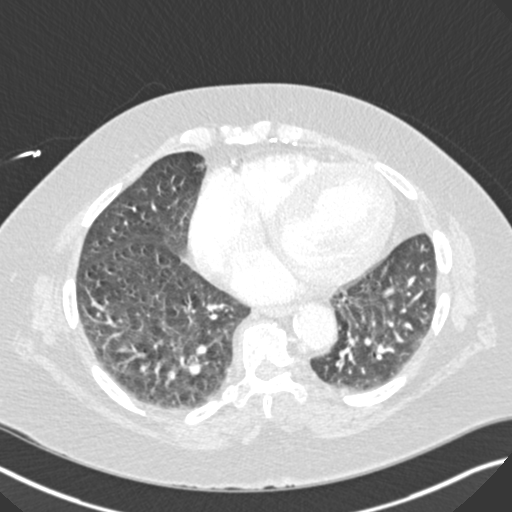
[im 102/260  soft-tissue]
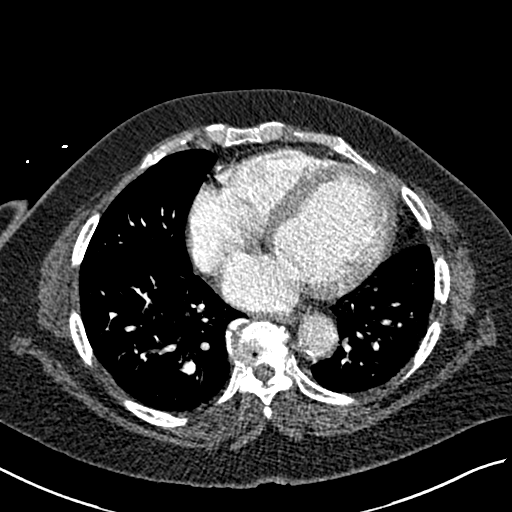
[im 124/260  lung]
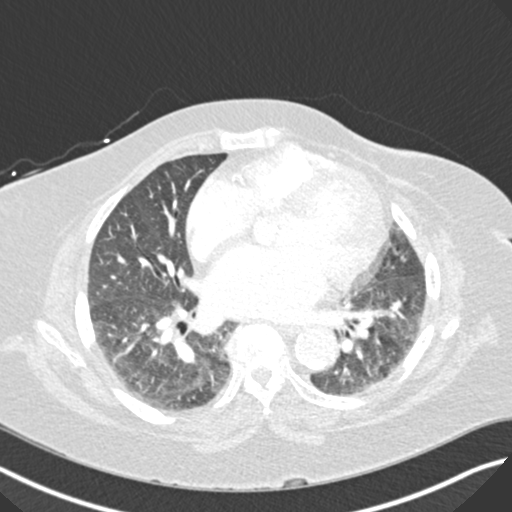
[im 136/260  soft-tissue]
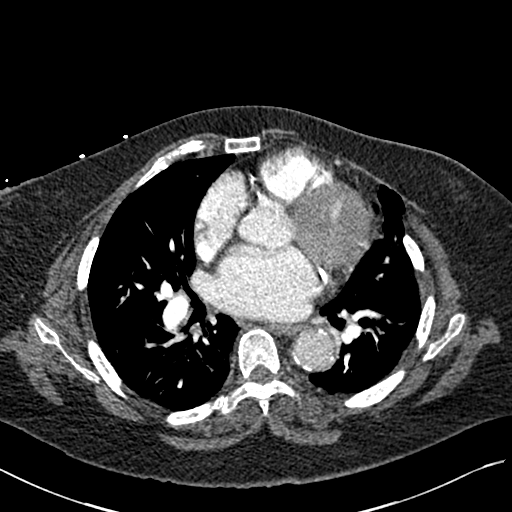
[im 158/260  lung]
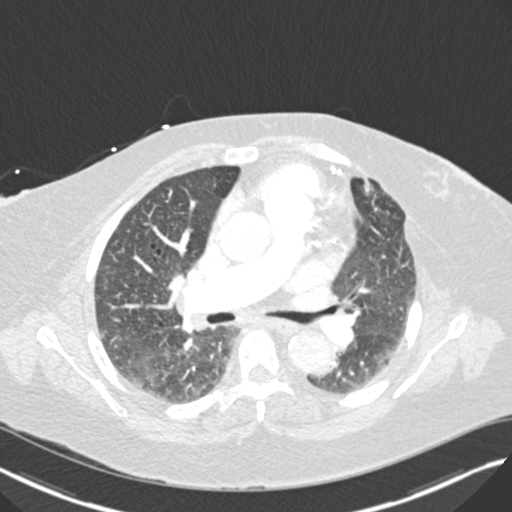
[im 169/260  soft-tissue]
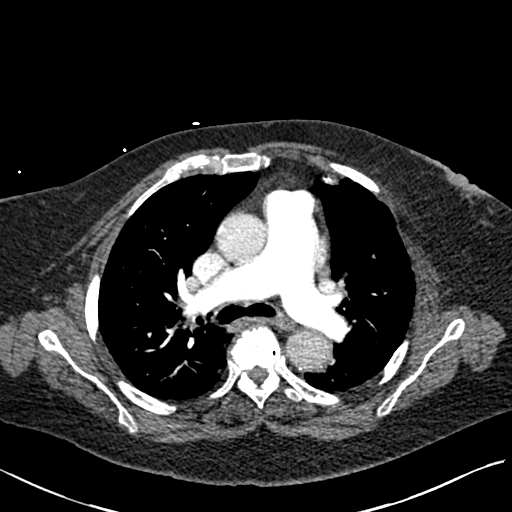
[im 192/260  lung]
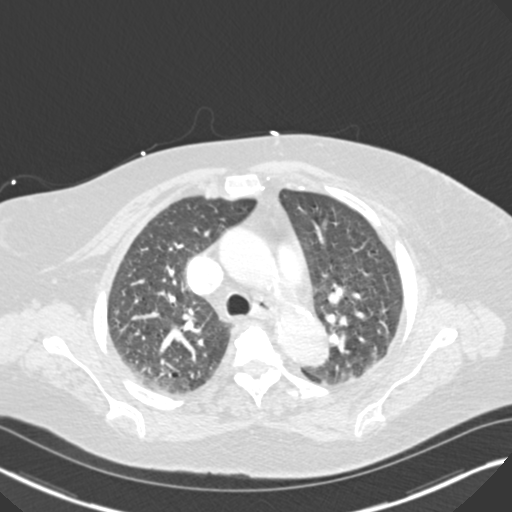
[im 214/260  soft-tissue]
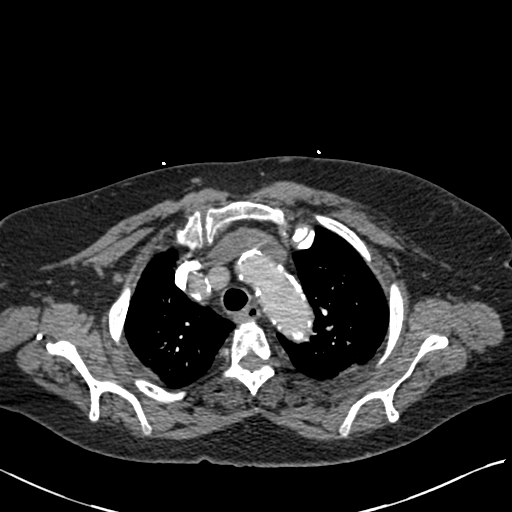
[im 226/260  lung]
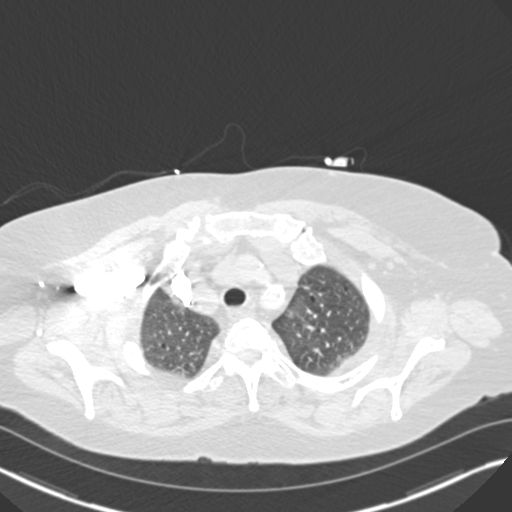
[im 248/260  soft-tissue]
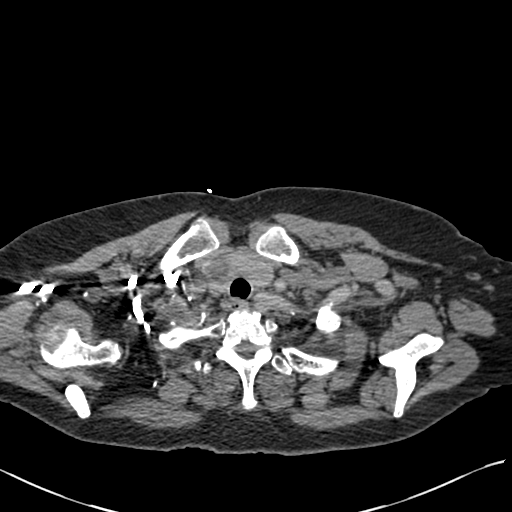

[Series 7: coronal mpr · coronal · 0.51mm/px · 3 of 95 slices shown]
[im 24/95  soft-tissue]
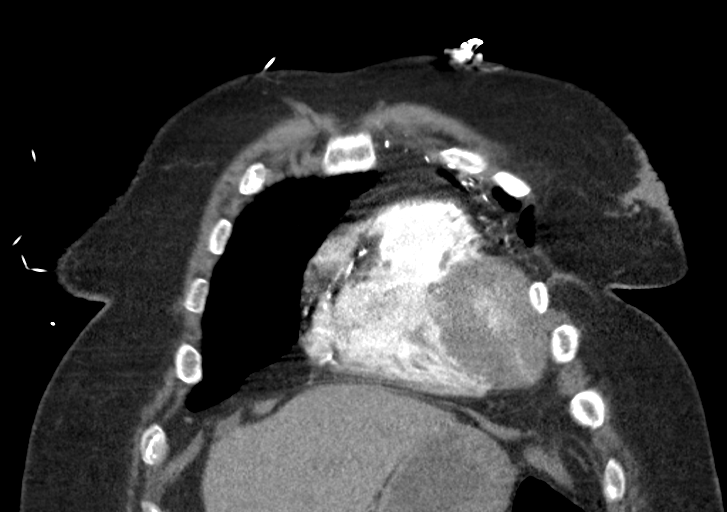
[im 48/95  soft-tissue]
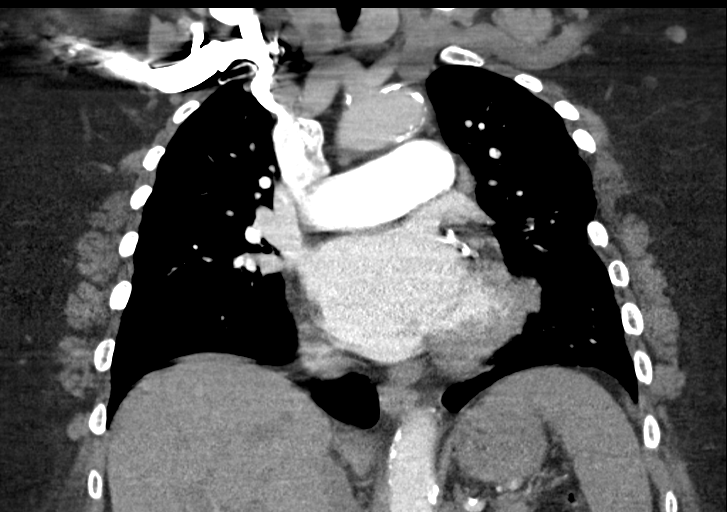
[im 71/95  soft-tissue]
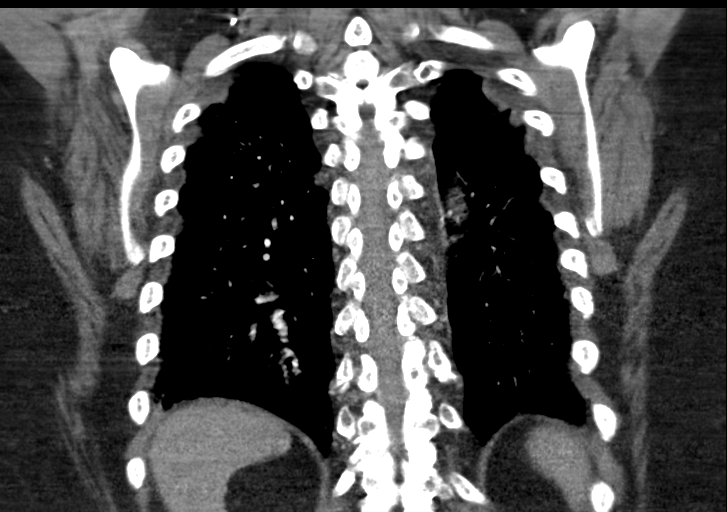

[17 of 46 positions shown; findings below may reference images not displayed]

FINDINGS: Cardiovascular: No filling defects within the pulmonary arterial
tree to suggest underlying pulmonary embolism. Heart size is
enlarged with left atrial and left ventricular dilatation. There is
no significant pericardial fluid, thickening or pericardial
calcification. There is aortic atherosclerosis, as well as
atherosclerosis of the great vessels of the mediastinum and the
coronary arteries, including calcified atherosclerotic plaque in the
left main, left anterior descending, left circumflex and right
coronary arteries. Thickening calcification of the aortic valve.
Dilatation of the pulmonic trunk (3.6 cm in diameter).

Mediastinum/Nodes: No pathologically enlarged mediastinal or hilar
lymph nodes. Esophagus is unremarkable in appearance. No axillary
lymphadenopathy.

Lungs/Pleura: Small pulmonary nodules in the right upper lobe
measuring up to 5 mm (axial image 40 of series 6). No other larger
more suspicious appearing pulmonary nodules or masses are noted. No
acute consolidative airspace disease. No pleural effusions.

Upper Abdomen: 1.3 cm exophytic low-attenuation lesion in the
anterior aspect of the upper pole the right kidney, compatible with
a simple cyst. Aortic atherosclerosis.

Musculoskeletal: Status post resection of the anterior aspect of the
left fifth rib. There are no aggressive appearing lytic or blastic
lesions noted in the visualized portions of the skeleton.

Review of the MIP images confirms the above findings.
IMPRESSION: 1. No evidence of pulmonary embolism.
2. No acute findings are noted in the thorax to account for the
patient's symptoms.
3. Aortic atherosclerosis, in addition to left main and 3 vessel
coronary artery disease. Please note that although the presence of
coronary artery calcium documents the presence of coronary artery
disease, the severity of this disease and any potential stenosis
cannot be assessed on this non-gated CT examination. Assessment for
potential risk factor modification, dietary therapy or pharmacologic
therapy may be warranted, if clinically indicated.
4. Dilatation of the pulmonic trunk (3.6 cm in diameter), concerning
for pulmonary arterial hypertension.
5. Cardiomegaly with left atrial and left ventricular dilatation.
